# Patient Record
Sex: Female | Born: 1935
Health system: Southern US, Community
[De-identification: ages and names within clinical notes are randomized; demographics above are authoritative.]

## PROBLEM LIST (undated history)

## (undated) DIAGNOSIS — C439 Malignant melanoma of skin, unspecified: Secondary | ICD-10-CM

## (undated) DIAGNOSIS — K219 Gastro-esophageal reflux disease without esophagitis: Secondary | ICD-10-CM

## (undated) DIAGNOSIS — I7300 Raynaud's syndrome without gangrene: Secondary | ICD-10-CM

## (undated) DIAGNOSIS — K635 Polyp of colon: Secondary | ICD-10-CM

## (undated) DIAGNOSIS — L439 Lichen planus, unspecified: Secondary | ICD-10-CM

## (undated) DIAGNOSIS — J45909 Unspecified asthma, uncomplicated: Secondary | ICD-10-CM

## (undated) DIAGNOSIS — K279 Peptic ulcer, site unspecified, unspecified as acute or chronic, without hemorrhage or perforation: Secondary | ICD-10-CM

## (undated) DIAGNOSIS — I119 Hypertensive heart disease without heart failure: Secondary | ICD-10-CM

## (undated) DIAGNOSIS — Z8619 Personal history of other infectious and parasitic diseases: Secondary | ICD-10-CM

## (undated) DIAGNOSIS — M4800 Spinal stenosis, site unspecified: Secondary | ICD-10-CM

## (undated) DIAGNOSIS — I779 Disorder of arteries and arterioles, unspecified: Secondary | ICD-10-CM

## (undated) DIAGNOSIS — F32A Depression, unspecified: Secondary | ICD-10-CM

## (undated) DIAGNOSIS — Z8719 Personal history of other diseases of the digestive system: Secondary | ICD-10-CM

## (undated) DIAGNOSIS — C801 Malignant (primary) neoplasm, unspecified: Secondary | ICD-10-CM

## (undated) DIAGNOSIS — Z8669 Personal history of other diseases of the nervous system and sense organs: Secondary | ICD-10-CM

## (undated) DIAGNOSIS — R519 Headache, unspecified: Secondary | ICD-10-CM

## (undated) DIAGNOSIS — M1990 Unspecified osteoarthritis, unspecified site: Secondary | ICD-10-CM

## (undated) DIAGNOSIS — F329 Major depressive disorder, single episode, unspecified: Secondary | ICD-10-CM

## (undated) DIAGNOSIS — R002 Palpitations: Secondary | ICD-10-CM

## (undated) DIAGNOSIS — M353 Polymyalgia rheumatica: Secondary | ICD-10-CM

## (undated) DIAGNOSIS — I471 Supraventricular tachycardia, unspecified: Secondary | ICD-10-CM

## (undated) DIAGNOSIS — I739 Peripheral vascular disease, unspecified: Secondary | ICD-10-CM

## (undated) DIAGNOSIS — R51 Headache: Secondary | ICD-10-CM

## (undated) DIAGNOSIS — I2510 Atherosclerotic heart disease of native coronary artery without angina pectoris: Secondary | ICD-10-CM

## (undated) DIAGNOSIS — T7840XA Allergy, unspecified, initial encounter: Secondary | ICD-10-CM

## (undated) DIAGNOSIS — I10 Essential (primary) hypertension: Secondary | ICD-10-CM

## (undated) DIAGNOSIS — Z87440 Personal history of urinary (tract) infections: Secondary | ICD-10-CM

## (undated) DIAGNOSIS — E785 Hyperlipidemia, unspecified: Secondary | ICD-10-CM

## (undated) DIAGNOSIS — N189 Chronic kidney disease, unspecified: Secondary | ICD-10-CM

## (undated) DIAGNOSIS — E559 Vitamin D deficiency, unspecified: Secondary | ICD-10-CM

## (undated) DIAGNOSIS — R011 Cardiac murmur, unspecified: Secondary | ICD-10-CM

## (undated) DIAGNOSIS — R32 Unspecified urinary incontinence: Secondary | ICD-10-CM

## (undated) HISTORY — DX: Polyp of colon: K63.5

## (undated) HISTORY — DX: Chronic kidney disease, unspecified: N18.9

## (undated) HISTORY — DX: Raynaud's syndrome without gangrene: I73.00

## (undated) HISTORY — DX: Depression, unspecified: F32.A

## (undated) HISTORY — DX: Hyperlipidemia, unspecified: E78.5

## (undated) HISTORY — PX: KNEE ARTHROSCOPY W/ OATS PROCEDURE: SHX1880

## (undated) HISTORY — DX: Lichen planus, unspecified: L43.9

## (undated) HISTORY — DX: Unspecified urinary incontinence: R32

## (undated) HISTORY — PX: BACK SURGERY: SHX140

## (undated) HISTORY — PX: BILATERAL CARPAL TUNNEL RELEASE: SHX6508

## (undated) HISTORY — DX: Personal history of other diseases of the nervous system and sense organs: Z86.69

## (undated) HISTORY — DX: Unspecified osteoarthritis, unspecified site: M19.90

## (undated) HISTORY — DX: Unspecified asthma, uncomplicated: J45.909

## (undated) HISTORY — DX: Malignant (primary) neoplasm, unspecified: C80.1

## (undated) HISTORY — PX: CORONARY ARTERY BYPASS GRAFT: SHX141

## (undated) HISTORY — DX: Atherosclerotic heart disease of native coronary artery without angina pectoris: I25.10

## (undated) HISTORY — DX: Cardiac murmur, unspecified: R01.1

## (undated) HISTORY — PX: ADENOIDECTOMY: SUR15

## (undated) HISTORY — DX: Personal history of urinary (tract) infections: Z87.440

## (undated) HISTORY — DX: Polymyalgia rheumatica: M35.3

## (undated) HISTORY — DX: Peptic ulcer, site unspecified, unspecified as acute or chronic, without hemorrhage or perforation: K27.9

## (undated) HISTORY — PX: CHOLECYSTECTOMY: SHX55

## (undated) HISTORY — DX: Vitamin D deficiency, unspecified: E55.9

## (undated) HISTORY — DX: Personal history of other infectious and parasitic diseases: Z86.19

## (undated) HISTORY — DX: Allergy, unspecified, initial encounter: T78.40XA

## (undated) HISTORY — PX: BREAST BIOPSY: SHX20

## (undated) HISTORY — PX: BREAST SURGERY: SHX581

## (undated) HISTORY — DX: Spinal stenosis, site unspecified: M48.00

## (undated) HISTORY — PX: JOINT REPLACEMENT: SHX530

## (undated) HISTORY — PX: TOTAL HIP ARTHROPLASTY: SHX124

## (undated) HISTORY — PX: HEMORRHOID BANDING: SHX5850

## (undated) HISTORY — DX: Gastro-esophageal reflux disease without esophagitis: K21.9

## (undated) HISTORY — DX: Major depressive disorder, single episode, unspecified: F32.9

## (undated) HISTORY — DX: Essential (primary) hypertension: I10

---

## 1996-10-08 HISTORY — PX: CORONARY ARTERY BYPASS GRAFT: SHX141

## 2010-08-10 LAB — HM COLONOSCOPY

## 2011-11-29 DIAGNOSIS — Z0000 Encounter for general adult medical examination without abnormal findings: Secondary | ICD-10-CM | POA: Diagnosis not present

## 2011-11-29 DIAGNOSIS — I10 Essential (primary) hypertension: Secondary | ICD-10-CM | POA: Diagnosis not present

## 2011-11-29 DIAGNOSIS — E041 Nontoxic single thyroid nodule: Secondary | ICD-10-CM | POA: Diagnosis not present

## 2011-11-29 DIAGNOSIS — D709 Neutropenia, unspecified: Secondary | ICD-10-CM | POA: Diagnosis not present

## 2011-11-29 DIAGNOSIS — E669 Obesity, unspecified: Secondary | ICD-10-CM | POA: Diagnosis not present

## 2011-11-29 DIAGNOSIS — E7800 Pure hypercholesterolemia, unspecified: Secondary | ICD-10-CM | POA: Diagnosis not present

## 2011-11-29 DIAGNOSIS — E559 Vitamin D deficiency, unspecified: Secondary | ICD-10-CM | POA: Diagnosis not present

## 2011-12-03 DIAGNOSIS — E7800 Pure hypercholesterolemia, unspecified: Secondary | ICD-10-CM | POA: Diagnosis not present

## 2011-12-03 DIAGNOSIS — I10 Essential (primary) hypertension: Secondary | ICD-10-CM | POA: Diagnosis not present

## 2011-12-03 DIAGNOSIS — K219 Gastro-esophageal reflux disease without esophagitis: Secondary | ICD-10-CM | POA: Diagnosis not present

## 2011-12-03 DIAGNOSIS — E559 Vitamin D deficiency, unspecified: Secondary | ICD-10-CM | POA: Diagnosis not present

## 2011-12-12 DIAGNOSIS — Z1231 Encounter for screening mammogram for malignant neoplasm of breast: Secondary | ICD-10-CM | POA: Diagnosis not present

## 2012-01-23 DIAGNOSIS — J209 Acute bronchitis, unspecified: Secondary | ICD-10-CM | POA: Diagnosis not present

## 2012-01-23 DIAGNOSIS — J9801 Acute bronchospasm: Secondary | ICD-10-CM | POA: Diagnosis not present

## 2012-01-23 DIAGNOSIS — R0602 Shortness of breath: Secondary | ICD-10-CM | POA: Diagnosis not present

## 2012-07-03 DIAGNOSIS — N905 Atrophy of vulva: Secondary | ICD-10-CM | POA: Diagnosis not present

## 2012-07-03 DIAGNOSIS — N3946 Mixed incontinence: Secondary | ICD-10-CM | POA: Diagnosis not present

## 2012-07-03 DIAGNOSIS — K649 Unspecified hemorrhoids: Secondary | ICD-10-CM | POA: Diagnosis not present

## 2012-07-03 DIAGNOSIS — N895 Stricture and atresia of vagina: Secondary | ICD-10-CM | POA: Diagnosis not present

## 2012-07-03 DIAGNOSIS — Z9189 Other specified personal risk factors, not elsewhere classified: Secondary | ICD-10-CM | POA: Diagnosis not present

## 2012-07-15 DIAGNOSIS — Z23 Encounter for immunization: Secondary | ICD-10-CM | POA: Diagnosis not present

## 2012-12-08 DIAGNOSIS — E7800 Pure hypercholesterolemia, unspecified: Secondary | ICD-10-CM | POA: Diagnosis not present

## 2012-12-08 DIAGNOSIS — I10 Essential (primary) hypertension: Secondary | ICD-10-CM | POA: Diagnosis not present

## 2012-12-08 DIAGNOSIS — Z79899 Other long term (current) drug therapy: Secondary | ICD-10-CM | POA: Diagnosis not present

## 2012-12-08 DIAGNOSIS — M545 Low back pain: Secondary | ICD-10-CM | POA: Diagnosis not present

## 2012-12-08 DIAGNOSIS — I2510 Atherosclerotic heart disease of native coronary artery without angina pectoris: Secondary | ICD-10-CM | POA: Diagnosis not present

## 2012-12-08 DIAGNOSIS — E559 Vitamin D deficiency, unspecified: Secondary | ICD-10-CM | POA: Diagnosis not present

## 2013-01-14 DIAGNOSIS — Z1231 Encounter for screening mammogram for malignant neoplasm of breast: Secondary | ICD-10-CM | POA: Diagnosis not present

## 2013-05-13 ENCOUNTER — Telehealth: Payer: Self-pay | Admitting: *Deleted

## 2013-05-13 NOTE — Telephone Encounter (Signed)
This pt has not been seen in this office yet. She has a NP appt on 9/10. Okay to fill?

## 2013-05-13 NOTE — Telephone Encounter (Signed)
Refill request  Zetia 10 mg tablet   #30   Take one tablet by mouth once daily

## 2013-05-13 NOTE — Telephone Encounter (Signed)
Needs to get refilled by her previous physician until she establishes care here.

## 2013-05-13 NOTE — Telephone Encounter (Signed)
Informed pharmacist.

## 2013-06-12 ENCOUNTER — Other Ambulatory Visit: Payer: Self-pay | Admitting: Internal Medicine

## 2013-06-17 ENCOUNTER — Ambulatory Visit: Payer: Self-pay | Admitting: Internal Medicine

## 2013-06-17 ENCOUNTER — Ambulatory Visit (INDEPENDENT_AMBULATORY_CARE_PROVIDER_SITE_OTHER): Payer: Medicare Other | Admitting: Internal Medicine

## 2013-06-17 ENCOUNTER — Encounter: Payer: Self-pay | Admitting: Internal Medicine

## 2013-06-17 VITALS — BP 90/60 | HR 63 | Temp 98.4°F | Ht 64.5 in | Wt 198.5 lb

## 2013-06-17 DIAGNOSIS — M4800 Spinal stenosis, site unspecified: Secondary | ICD-10-CM

## 2013-06-17 DIAGNOSIS — E049 Nontoxic goiter, unspecified: Secondary | ICD-10-CM

## 2013-06-17 DIAGNOSIS — E7800 Pure hypercholesterolemia, unspecified: Secondary | ICD-10-CM

## 2013-06-17 DIAGNOSIS — K219 Gastro-esophageal reflux disease without esophagitis: Secondary | ICD-10-CM

## 2013-06-17 DIAGNOSIS — Z86010 Personal history of colonic polyps: Secondary | ICD-10-CM | POA: Diagnosis not present

## 2013-06-17 DIAGNOSIS — I2510 Atherosclerotic heart disease of native coronary artery without angina pectoris: Secondary | ICD-10-CM

## 2013-06-17 DIAGNOSIS — E559 Vitamin D deficiency, unspecified: Secondary | ICD-10-CM | POA: Diagnosis not present

## 2013-06-17 DIAGNOSIS — L439 Lichen planus, unspecified: Secondary | ICD-10-CM

## 2013-06-17 DIAGNOSIS — K649 Unspecified hemorrhoids: Secondary | ICD-10-CM

## 2013-06-17 DIAGNOSIS — F329 Major depressive disorder, single episode, unspecified: Secondary | ICD-10-CM

## 2013-06-17 DIAGNOSIS — I10 Essential (primary) hypertension: Secondary | ICD-10-CM

## 2013-06-17 DIAGNOSIS — K449 Diaphragmatic hernia without obstruction or gangrene: Secondary | ICD-10-CM

## 2013-06-17 DIAGNOSIS — M1990 Unspecified osteoarthritis, unspecified site: Secondary | ICD-10-CM

## 2013-06-17 DIAGNOSIS — J45909 Unspecified asthma, uncomplicated: Secondary | ICD-10-CM

## 2013-06-17 DIAGNOSIS — N390 Urinary tract infection, site not specified: Secondary | ICD-10-CM

## 2013-06-17 DIAGNOSIS — G43909 Migraine, unspecified, not intractable, without status migrainosus: Secondary | ICD-10-CM

## 2013-06-21 ENCOUNTER — Encounter: Payer: Self-pay | Admitting: Internal Medicine

## 2013-06-21 DIAGNOSIS — J45909 Unspecified asthma, uncomplicated: Secondary | ICD-10-CM | POA: Insufficient documentation

## 2013-06-21 DIAGNOSIS — N390 Urinary tract infection, site not specified: Secondary | ICD-10-CM | POA: Insufficient documentation

## 2013-06-21 DIAGNOSIS — E049 Nontoxic goiter, unspecified: Secondary | ICD-10-CM | POA: Insufficient documentation

## 2013-06-21 DIAGNOSIS — E559 Vitamin D deficiency, unspecified: Secondary | ICD-10-CM | POA: Insufficient documentation

## 2013-06-21 DIAGNOSIS — L439 Lichen planus, unspecified: Secondary | ICD-10-CM | POA: Insufficient documentation

## 2013-06-21 DIAGNOSIS — F329 Major depressive disorder, single episode, unspecified: Secondary | ICD-10-CM | POA: Insufficient documentation

## 2013-06-21 DIAGNOSIS — I2510 Atherosclerotic heart disease of native coronary artery without angina pectoris: Secondary | ICD-10-CM | POA: Insufficient documentation

## 2013-06-21 DIAGNOSIS — K648 Other hemorrhoids: Secondary | ICD-10-CM | POA: Insufficient documentation

## 2013-06-21 DIAGNOSIS — M1990 Unspecified osteoarthritis, unspecified site: Secondary | ICD-10-CM | POA: Insufficient documentation

## 2013-06-21 DIAGNOSIS — I10 Essential (primary) hypertension: Secondary | ICD-10-CM | POA: Insufficient documentation

## 2013-06-21 DIAGNOSIS — Z860100 Personal history of colon polyps, unspecified: Secondary | ICD-10-CM | POA: Insufficient documentation

## 2013-06-21 DIAGNOSIS — M4800 Spinal stenosis, site unspecified: Secondary | ICD-10-CM | POA: Insufficient documentation

## 2013-06-21 DIAGNOSIS — I25118 Atherosclerotic heart disease of native coronary artery with other forms of angina pectoris: Secondary | ICD-10-CM | POA: Insufficient documentation

## 2013-06-21 DIAGNOSIS — E7800 Pure hypercholesterolemia, unspecified: Secondary | ICD-10-CM | POA: Insufficient documentation

## 2013-06-21 DIAGNOSIS — G43909 Migraine, unspecified, not intractable, without status migrainosus: Secondary | ICD-10-CM | POA: Insufficient documentation

## 2013-06-21 DIAGNOSIS — K449 Diaphragmatic hernia without obstruction or gangrene: Secondary | ICD-10-CM | POA: Insufficient documentation

## 2013-06-21 DIAGNOSIS — K219 Gastro-esophageal reflux disease without esophagitis: Secondary | ICD-10-CM | POA: Insufficient documentation

## 2013-06-21 HISTORY — DX: Essential (primary) hypertension: I10

## 2013-06-21 NOTE — Assessment & Plan Note (Signed)
Breathing stable currently.  Uses inhalers prn.  Follow.

## 2013-06-21 NOTE — Assessment & Plan Note (Signed)
Not a significant issue for her now.  Follow.   

## 2013-06-21 NOTE — Assessment & Plan Note (Signed)
Stable on current regimen.  Follow.   

## 2013-06-21 NOTE — Assessment & Plan Note (Signed)
Stable currently.  Uses miralax to keep her bowels regular.  Follow.

## 2013-06-21 NOTE — Assessment & Plan Note (Signed)
S/p CABG 1998.  Currently doing well.  ECHO 11/28/10 revealed EF 55-60%, mild to moderate TR with RVSP 36 c/w mild pulmonary hypertension and no significant MR.    

## 2013-06-21 NOTE — Progress Notes (Signed)
Subjective:    Patient ID: Charlotte Wagner, female    DOB: Feb 09, 1936, 77 y.o.   MRN: [id number]  HPI 77 year old female with past history of CAD s/p CABG 1998, hypertension, hypercholesterolemia, depression, lichen planus and GERD.  She comes in today to follow up on these issues as well as to establish care.  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  Moved here 6/14 from [place] - to be closer to her family.  She reports a history of asthma.  Her parents smoked.  She previously smoked.  Had childhood bronchitis.  Currently breathing is doing well.  She exercises on her bike.  No sob.  She also has spinal stenosis.  Has had fusion L3-5 in 2005.  Will notice some back spasm at times.  More when she gets oob or if she twist wrong.  She also has some left leg neuropathy.  Lateral foot and toes.  Some weakness.  This is not new.  She is following and monitoring this.  Also reports a history of CAD.  S/p CABG.  Was followed by Dr Leonarda Salon - cardiology in [place].  ECHO 2-3 years ago.  States everything checked out fine.  Currently feels she is doing well from a cardiac standpoint.  Blood pressure averages 100-110/60-70.  She feels she is doing well regarding her depression.  She has decreased her elavil to 50mg  q hs and trazodone to 2 q hs.  Doing well on this regimen and has been on this regimen for years.  Has some problems with hemorrhoids.  Takes miralax to keep her bowels regular.  Will intermittently notice some BRB.  Recently did IFOB - negative.  Also has lichen planus.  Uses Boric acid and 2.5% HC cream.  Overall she feels things are stable and that she is doing relatively well.     Past Medical History  Diagnosis Date  . Asthma   . Arthritis     s/p bilateral knees and left hip replacement  . Depression   . History of chicken pox   . Cancer     melanoma right arm  . GERD (gastroesophageal reflux disease)     h/o hiatal hernia  . Allergy   . Arrhythmia     rare PVC's  . Heart murmur   . CAD  (coronary artery disease)     s/p CABG 12/1996  . Hypertension   . Hyperlipidemia   . Chronic kidney disease   . Hx of migraines     rare now  . Colon polyps     H/O  . Spinal stenosis   . Hx: UTI (urinary tract infection)   . Urine incontinence     H/O  . PUD (peptic ulcer disease)     remote history  . Lichen planus   . Vitamin D deficiency   . Raynaud's phenomenon   . PMR (polymyalgia rheumatica)     h/o    Outpatient Encounter Prescriptions as of 06/17/2013  Medication Sig Dispense Refill  . Acetaminophen (TYLENOL EXTRA STRENGTH PO) Take by mouth.      [name] amitriptyline (ELAVIL) 50 MG tablet Take 50 mg by mouth at bedtime.      [name] b complex vitamins tablet Take 1 tablet by mouth daily.      . bisoprolol-hydrochlorothiazide (ZIAC) 2.5-6.25 MG per tablet Take 1 tablet by mouth daily.      . Cholecalciferol (VITAMIN D3) 2000 UNITS TABS Take by mouth.      . Coenzyme  Q10 (CO Q 10 PO) Take by mouth.      . ezetimibe (ZETIA) 10 MG tablet Take 10 mg by mouth daily.      . folic acid (FOLVITE) 1 MG tablet Take 1 mg by mouth daily.      . hydrocortisone 2.5 % cream Apply topically 3 (three) times a week.      [name] LYSINE PO Take by mouth.      . nabumetone (RELAFEN) 750 MG tablet Take 750 mg by mouth 2 (two) times daily.      . Omega-3 Fatty Acids (FISH OIL PO) Take 2,000 mg by mouth daily.      . pantoprazole (PROTONIX) 40 MG tablet take 1 tablet by mouth twice a day  60 tablet  0  . Polyethylene Glycol 3350 (MIRALAX PO) Take by mouth as needed.      . Prenatal Vit-Fe Fumarate-FA (M-VIT PO) Take by mouth daily.      . ramipril (ALTACE) 10 MG capsule Take 10 mg by mouth daily.      . simvastatin (ZOCOR) 10 MG tablet Take 10 mg by mouth. Only on Mon, Wed, & Fri      . spironolactone (ALDACTONE) 25 MG tablet Take 25 mg by mouth daily.      . TRAZODONE HCL PO Take 250 mg by mouth.      . verapamil (COVERA HS) 180 MG (CO) 24 hr tablet Take 180 mg by mouth 2 (two) times daily.       .  vitamin B-12 (CYANOCOBALAMIN) 1000 MCG tablet Take 1,000 mcg by mouth daily.      . vitamin C (ASCORBIC ACID) 500 MG tablet Take 500 mg by mouth daily.       No facility-administered encounter medications on file as of 06/17/2013.    Review of Systems Patient denies any headache, lightheadedness or dizziness.  No sinus or allergy symptoms.   No chest pain, tightness or palpitations.  No increased shortness of breath, cough or congestion.  Breathing stable.  No nausea or vomiting.  No abdominal pain or cramping.  No acid reflux problems reported.  No significant bowel change, such as diarrhea, constipation, or melana.  Occasionally will notice some BRB when hemorrhoids flare.  Uses miralax to keep her bowels regular.  No urine change.   Some limitations with her spinal stenosis.  Has noticed some left foot and toes - weakness - neuropathy.  Doing well - regarding her depression.       Objective:   Physical Exam Filed Vitals:   06/17/13 0844  BP: 90/60  Pulse: 63  Temp: 98.4 F (47.31 C)   77 year old female in no acute distress.   HEENT:  Nares- clear.  Oropharynx - without lesions. NECK:  Supple.  Nontender.  No audible bruit.  HEART:  Appears to be regular. LUNGS:  No crackles or wheezing audible.  Respirations even and unlabored.  RADIAL PULSE:  Equal bilaterally.  ABDOMEN:  Soft, nontender.  Bowel sounds present and normal.  No audible abdominal bruit.   EXTREMITIES:  No increased edema present.  DP pulses palpable and equal bilaterally.          Assessment & Plan:  HEALTH MAINTENANCE.  Schedule a physical when due.  Colonoscopy 07/2010 with polyps.  Recommended f/u colonoscopy in five years.  Mammogram 4/14 - ok.   I spent 45 minutes with the patient and more than 50% of the time was spent in consultation regarding the above.

## 2013-06-21 NOTE — Assessment & Plan Note (Signed)
S/p lumbar fusion.  Still with some back issues.  Follow.

## 2013-06-21 NOTE — Assessment & Plan Note (Signed)
Documented thyroid goiter.  No abnormality noted today on exam.  Check thyroid function.   

## 2013-06-21 NOTE — Assessment & Plan Note (Signed)
Symptoms controlled.  Follow.  Has a remote history of PUD.    

## 2013-06-21 NOTE — Assessment & Plan Note (Signed)
Stable

## 2013-06-21 NOTE — Assessment & Plan Note (Signed)
Low cholesterol diet and exercise.  Same medication regimen.  Check lipid panel.

## 2013-06-21 NOTE — Assessment & Plan Note (Signed)
Last vitamin d level 3/14 - normal.   

## 2013-06-21 NOTE — Assessment & Plan Note (Signed)
Has been well controlled.  She monitors.  Check metabolic panel.

## 2013-06-21 NOTE — Assessment & Plan Note (Signed)
Is s/p bilateral knee replacements and left hip replacement.  Exercises.  Follow.   

## 2013-06-21 NOTE — Assessment & Plan Note (Signed)
On macrobid now.  Follow.

## 2013-06-21 NOTE — Assessment & Plan Note (Signed)
Appears to be relatively stable.  Follow.

## 2013-06-21 NOTE — Assessment & Plan Note (Signed)
Colonoscopy 08/10/10 - tubular adenoma.  Negative for high grade dysplasia.  States recommended f/u in five years.    

## 2013-07-03 ENCOUNTER — Telehealth: Payer: Self-pay | Admitting: Internal Medicine

## 2013-07-03 ENCOUNTER — Other Ambulatory Visit (INDEPENDENT_AMBULATORY_CARE_PROVIDER_SITE_OTHER): Payer: Medicare Other

## 2013-07-03 DIAGNOSIS — I10 Essential (primary) hypertension: Secondary | ICD-10-CM

## 2013-07-03 DIAGNOSIS — E049 Nontoxic goiter, unspecified: Secondary | ICD-10-CM

## 2013-07-03 DIAGNOSIS — I2510 Atherosclerotic heart disease of native coronary artery without angina pectoris: Secondary | ICD-10-CM

## 2013-07-03 DIAGNOSIS — E7800 Pure hypercholesterolemia, unspecified: Secondary | ICD-10-CM | POA: Diagnosis not present

## 2013-07-03 LAB — CBC WITH DIFFERENTIAL/PLATELET
Basophils Absolute: 0 10*3/uL (ref 0.0–0.1)
Basophils Relative: 0.5 % (ref 0.0–3.0)
Eosinophils Relative: 4.5 % (ref 0.0–5.0)
Lymphocytes Relative: 22.5 % (ref 12.0–46.0)
Monocytes Absolute: 0.5 10*3/uL (ref 0.1–1.0)
Monocytes Relative: 13 % — ABNORMAL HIGH (ref 3.0–12.0)
Neutrophils Relative %: 59.5 % (ref 43.0–77.0)
Platelets: 210 10*3/uL (ref 150.0–400.0)
WBC: 3.7 10*3/uL — ABNORMAL LOW (ref 4.5–10.5)

## 2013-07-03 LAB — BASIC METABOLIC PANEL
CO2: 26 mEq/L (ref 19–32)
Creatinine, Ser: 1 mg/dL (ref 0.4–1.2)
GFR: 58.08 mL/min — ABNORMAL LOW (ref >60.00)
Glucose, Bld: 93 mg/dL (ref 70–99)
Potassium: 4.6 mEq/L (ref 3.5–5.1)
Sodium: 137 mEq/L (ref 135–145)

## 2013-07-03 LAB — TSH: TSH: 1.28 u[IU]/mL (ref 0.35–5.50)

## 2013-07-03 LAB — HEPATIC FUNCTION PANEL
Albumin: 4 g/dL (ref 3.5–5.2)
Bilirubin, Direct: 0.1 mg/dL (ref 0.0–0.3)
Total Protein: 6.2 g/dL (ref 6.0–8.3)

## 2013-07-03 LAB — LIPID PANEL
Cholesterol: 208 mg/dL — ABNORMAL HIGH (ref 0–200)
HDL: 46.4 mg/dL (ref >39.00)
Triglycerides: 124 mg/dL (ref 0.0–149.0)
VLDL: 24.8 mg/dL (ref 0.0–40.0)

## 2013-07-03 LAB — LDL CHOLESTEROL, DIRECT: Direct LDL: 126.7 mg/dL

## 2013-07-03 NOTE — Telephone Encounter (Signed)
In your green folder 

## 2013-07-03 NOTE — Telephone Encounter (Signed)
Pt dropped of handicap form And form from [facility] stating it was ok for her to exercise in box

## 2013-07-06 ENCOUNTER — Other Ambulatory Visit: Payer: Self-pay | Admitting: Internal Medicine

## 2013-07-06 ENCOUNTER — Other Ambulatory Visit: Payer: Self-pay | Admitting: *Deleted

## 2013-07-06 DIAGNOSIS — D7589 Other specified diseases of blood and blood-forming organs: Secondary | ICD-10-CM

## 2013-07-06 DIAGNOSIS — D72819 Decreased white blood cell count, unspecified: Secondary | ICD-10-CM

## 2013-07-06 MED ORDER — HYDROCORTISONE 2.5 % EX CREA: TOPICAL_CREAM | CUTANEOUS | Status: DC

## 2013-07-06 NOTE — Telephone Encounter (Signed)
Forms completed and will place in your box.

## 2013-07-06 NOTE — Progress Notes (Signed)
Order placed for f/u labs.  

## 2013-07-06 NOTE — Telephone Encounter (Signed)
Placed up front for mailing & Copy of Medical Clearance will be faxed

## 2013-07-13 ENCOUNTER — Other Ambulatory Visit: Payer: Self-pay | Admitting: *Deleted

## 2013-07-13 MED ORDER — NABUMETONE 750 MG PO TABS: 750.0000 mg | ORAL_TABLET | Freq: Two times a day (BID) | ORAL | Status: DC

## 2013-07-13 MED ORDER — EZETIMIBE 10 MG PO TABS: 10.0000 mg | ORAL_TABLET | Freq: Every day | ORAL | Status: DC

## 2013-07-16 ENCOUNTER — Telehealth: Payer: Self-pay | Admitting: Internal Medicine

## 2013-07-16 NOTE — Telephone Encounter (Signed)
Pt notified of her lab results. Not sure why I never saw them before today. Pt does not want to repeat labs at this time because she states that her current readings are actually good for her (her usual). She has had a work-up in the past and everything was fine. Mailed patient a copy of labs after notifying her of results.

## 2013-07-16 NOTE — Telephone Encounter (Signed)
Pt called and wanted her labs results mailed to her.  Pt stated she has never received her results.

## 2013-07-22 ENCOUNTER — Other Ambulatory Visit: Payer: Self-pay | Admitting: *Deleted

## 2013-07-22 MED ORDER — RAMIPRIL 10 MG PO CAPS: 10.0000 mg | ORAL_CAPSULE | Freq: Every day | ORAL | Status: DC

## 2013-07-27 ENCOUNTER — Other Ambulatory Visit: Payer: Self-pay | Admitting: *Deleted

## 2013-07-27 MED ORDER — HYDROCORTISONE 2.5 % EX CREA: TOPICAL_CREAM | CUTANEOUS | Status: DC

## 2013-08-03 ENCOUNTER — Ambulatory Visit: Payer: Self-pay | Admitting: Internal Medicine

## 2013-08-03 DIAGNOSIS — Z23 Encounter for immunization: Secondary | ICD-10-CM | POA: Diagnosis not present

## 2013-08-05 ENCOUNTER — Telehealth: Payer: Self-pay | Admitting: Internal Medicine

## 2013-08-05 ENCOUNTER — Other Ambulatory Visit: Payer: Self-pay | Admitting: *Deleted

## 2013-08-05 MED ORDER — SPIRONOLACTONE 25 MG PO TABS: 25.0000 mg | ORAL_TABLET | ORAL | Status: DC

## 2013-08-05 MED ORDER — AMITRIPTYLINE HCL 50 MG PO TABS: 50.0000 mg | ORAL_TABLET | Freq: Every day | ORAL | Status: DC

## 2013-08-05 NOTE — Telephone Encounter (Signed)
Pt states pharmacy faxed on Friday and Monday request for spironolactone 25 1 q a.m. And has not gotten a response.  Pt states she also needs amitriptyline 50 1 qhs that was previously prescribed from [place].  Pt states she gave a list of meds at her first visit in September.  Pt says she is out of amitriptyline and needs this to sleep.  Please contact pt.

## 2013-08-05 NOTE — Telephone Encounter (Signed)
Refilled meds today & pt notified

## 2013-08-12 ENCOUNTER — Other Ambulatory Visit: Payer: Self-pay | Admitting: Adult Health

## 2013-08-12 NOTE — Telephone Encounter (Signed)
Eprescribed.

## 2013-08-17 ENCOUNTER — Other Ambulatory Visit: Payer: Self-pay | Admitting: *Deleted

## 2013-08-17 MED ORDER — HYDROCORTISONE 2.5 % EX CREA: TOPICAL_CREAM | CUTANEOUS | Status: DC

## 2013-09-07 ENCOUNTER — Other Ambulatory Visit: Payer: Self-pay | Admitting: *Deleted

## 2013-09-07 MED ORDER — HYDROCORTISONE 2.5 % EX CREA: TOPICAL_CREAM | CUTANEOUS | Status: DC

## 2013-09-22 ENCOUNTER — Other Ambulatory Visit: Payer: Self-pay | Admitting: *Deleted

## 2013-09-22 NOTE — Telephone Encounter (Signed)
Ok refill? 

## 2013-09-22 NOTE — Telephone Encounter (Signed)
Need to clarify dose of trazodone with pt.  Need mg dose and how much she is taking per night.  Thanks.

## 2013-09-23 ENCOUNTER — Encounter: Payer: Self-pay | Admitting: *Deleted

## 2013-09-23 MED ORDER — TRAZODONE HCL 150 MG PO TABS: ORAL_TABLET | ORAL | Status: DC

## 2013-09-23 NOTE — Telephone Encounter (Signed)
LMTCB

## 2013-09-23 NOTE — Telephone Encounter (Signed)
Pt takes 250mg  every night (1 2/3 of the 150mg  tablets)-usually gets #60

## 2013-09-23 NOTE — Telephone Encounter (Signed)
Refilled trazodone 150mg  (as directed) #60 with 2 refills.

## 2013-09-28 ENCOUNTER — Other Ambulatory Visit: Payer: Self-pay | Admitting: *Deleted

## 2013-09-28 NOTE — Telephone Encounter (Signed)
Refill

## 2013-09-29 MED ORDER — HYDROCORTISONE 2.5 % EX CREA: TOPICAL_CREAM | CUTANEOUS | Status: DC

## 2013-09-29 NOTE — Telephone Encounter (Signed)
Refilled hydrocortisone cream

## 2013-10-09 ENCOUNTER — Other Ambulatory Visit: Payer: Self-pay | Admitting: *Deleted

## 2013-10-09 MED ORDER — NABUMETONE 750 MG PO TABS: 750.0000 mg | ORAL_TABLET | Freq: Two times a day (BID) | ORAL | Status: DC

## 2013-10-13 ENCOUNTER — Telehealth: Payer: Self-pay | Admitting: Internal Medicine

## 2013-10-13 NOTE — Telephone Encounter (Signed)
Pt left vm.  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  Contacted pt to make appt., scheduled 1/7 @ 10:45 a.m.

## 2013-10-13 NOTE — Telephone Encounter (Signed)
Noted  

## 2013-10-14 ENCOUNTER — Encounter: Payer: Self-pay | Admitting: Internal Medicine

## 2013-10-14 ENCOUNTER — Ambulatory Visit (INDEPENDENT_AMBULATORY_CARE_PROVIDER_SITE_OTHER): Payer: Medicare Other | Admitting: Internal Medicine

## 2013-10-14 VITALS — BP 130/80 | HR 60 | Temp 98.3°F | Ht 64.5 in | Wt 197.0 lb

## 2013-10-14 DIAGNOSIS — I10 Essential (primary) hypertension: Secondary | ICD-10-CM

## 2013-10-14 DIAGNOSIS — J45909 Unspecified asthma, uncomplicated: Secondary | ICD-10-CM

## 2013-10-14 DIAGNOSIS — K219 Gastro-esophageal reflux disease without esophagitis: Secondary | ICD-10-CM

## 2013-10-14 DIAGNOSIS — J209 Acute bronchitis, unspecified: Secondary | ICD-10-CM | POA: Diagnosis not present

## 2013-10-14 MED ORDER — LEVALBUTEROL TARTRATE 45 MCG/ACT IN AERO: 2.0000 | INHALATION_SPRAY | Freq: Four times a day (QID) | RESPIRATORY_TRACT | Status: DC | PRN

## 2013-10-14 MED ORDER — CLARITHROMYCIN ER 500 MG PO TB24: 1000.0000 mg | ORAL_TABLET | Freq: Every day | ORAL | Status: DC

## 2013-10-14 MED ORDER — FLUTICASONE-SALMETEROL 250-50 MCG/DOSE IN AEPB: 1.0000 | INHALATION_SPRAY | Freq: Two times a day (BID) | RESPIRATORY_TRACT | Status: DC

## 2013-10-14 NOTE — Progress Notes (Signed)
Pre-visit discussion using our clinic review tool. No additional management support is needed unless otherwise documented below in the visit note.  

## 2013-10-18 ENCOUNTER — Encounter: Payer: Self-pay | Admitting: Internal Medicine

## 2013-10-18 DIAGNOSIS — J209 Acute bronchitis, unspecified: Secondary | ICD-10-CM | POA: Insufficient documentation

## 2013-10-18 NOTE — Assessment & Plan Note (Signed)
Has been well controlled.  She monitors.    

## 2013-10-18 NOTE — Assessment & Plan Note (Signed)
Treat current infection.  xopenex inhaler refilled.  Has advair she uses.  Follow.

## 2013-10-18 NOTE — Progress Notes (Signed)
Subjective:    Patient ID: Phenix Grein, female    DOB: [birth year], 78 y.o.   MRN: [id number]  HPI 78 year old female with past history of CAD s/p CABG 1998, hypertension, hypercholesterolemia, depression, lichen planus and GERD.  She comes in today as a work in with concerns regarding increased cough and congestion. Symptoms have worsened over the last few days.  Started out at Christmas - with a cold.  This improved and then a few days ago, worsening symptoms.  Describes increased nasal congestion.  No sinus pressure.  Increased chest congestion and cough.  Sleeping sitting up.  No fever.  Eating.  States she gets bronchitis intermittently and these symptoms are c/w her previous infections.  She had some keflex at home and did take this medication.  Usually takes Biaxin and uses her inhalers.  This usually works.  She request biaxin as the abx.  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     Past Medical History  Diagnosis Date  . Asthma   . Arthritis     s/p bilateral knees and left hip replacement  . Depression   . History of chicken pox   . Cancer     melanoma right arm  . GERD (gastroesophageal reflux disease)     h/o hiatal hernia  . Allergy   . Arrhythmia     rare PVC's  . Heart murmur   . CAD (coronary artery disease)     s/p CABG 12/1996  . Hypertension   . Hyperlipidemia   . Chronic kidney disease   . Hx of migraines     rare now  . Colon polyps     H/O  . Spinal stenosis   . Hx: UTI (urinary tract infection)   . Urine incontinence     H/O  . PUD (peptic ulcer disease)     remote history  . Lichen planus   . Vitamin D deficiency   . Raynaud's phenomenon   . PMR (polymyalgia rheumatica)     h/o    Outpatient Encounter Prescriptions as of 10/14/2013  Medication Sig  . Acetaminophen (TYLENOL EXTRA STRENGTH PO) Take by mouth.  [name] amitriptyline (ELAVIL) 50 MG tablet Take 1 tablet (50 mg total) by mouth at bedtime.  [name] b complex vitamins  tablet Take 1 tablet by mouth daily.  . bisoprolol-hydrochlorothiazide (ZIAC) 2.5-6.25 MG per tablet Take 1 tablet by mouth daily.  . Cholecalciferol (VITAMIN D3) 2000 UNITS TABS Take by mouth.  . Coenzyme Q10 (CO Q 10 PO) Take by mouth.  . ezetimibe (ZETIA) 10 MG tablet Take 1 tablet (10 mg total) by mouth daily.  . folic acid (FOLVITE) 1 MG tablet Take 1 mg by mouth daily.  . hydrocortisone 2.5 % cream Apply topically 3 (three) times a week.  [name] LYSINE PO Take by mouth.  . nabumetone (RELAFEN) 750 MG tablet Take 1 tablet (750 mg total) by mouth 2 (two) times daily.  . Omega-3 Fatty Acids (FISH OIL PO) Take 2,000 mg by mouth daily.  . pantoprazole (PROTONIX) 40 MG tablet take 1 tablet by mouth twice a day  . Polyethylene Glycol 3350 (MIRALAX PO) Take by mouth as needed.  . Prenatal Vit-Fe Fumarate-FA (M-VIT PO) Take by mouth daily.  . ramipril (ALTACE) 10 MG capsule Take 1 capsule (10 mg total) by mouth daily.  . simvastatin (ZOCOR) 10 MG tablet Take 10 mg by mouth. Only on Mon, Wed, & Fri  .  spironolactone (ALDACTONE) 25 MG tablet Take 1 tablet (25 mg total) by mouth every morning.  . traZODone (DESYREL) 150 MG tablet Take as directed.  . verapamil (COVERA HS) 180 MG (CO) 24 hr tablet Take 180 mg by mouth 2 (two) times daily.   . vitamin B-12 (CYANOCOBALAMIN) 1000 MCG tablet Take 1,000 mcg by mouth daily.  . vitamin C (ASCORBIC ACID) 500 MG tablet Take 500 mg by mouth daily.  . clarithromycin (BIAXIN XL) 500 MG 24 hr tablet Take 2 tablets (1,000 mg total) by mouth daily.  . Fluticasone-Salmeterol (ADVAIR DISKUS) 250-50 MCG/DOSE AEPB Inhale 1 puff into the lungs 2 (two) times daily.  [name] levalbuterol (XOPENEX HFA) 45 MCG/ACT inhaler Inhale 2 puffs into the lungs every 6 (six) hours as needed for wheezing.    Review of Systems Patient denies any headache, lightheadedness or dizziness.  Does report some increased nasal congestion and chest congestion.  Left ear feels full.  Increased cough.   Some wheezing.  No chest pain, tightness or palpitations.  No increased shortness of breath.  No nausea or vomiting.  No abdominal pain or cramping.  No acid reflux problems reported.  No significant bowel change, such as diarrhea.  Request biaxin stating this works well for her.  Uses her advair and atrovent inhaler.  Does not have any xopenex.  Request refill.         Objective:   Physical Exam  Filed Vitals:   10/14/13 1102  BP: 130/80  Pulse: 60  Temp: 98.3 F (64.27 C)   78 year old female in no acute distress.   HEENT:  Nares- erythematous turbinates.   Oropharynx - without lesions.  No significant tenderness to palpation over the sinuses.  NECK:  Supple.  Nontender.    HEART:  Appears to be regular. LUNGS:  No crackles.  Increased congestion.  Cleared with coughing.  Respirations even and unlabored.          Assessment & Plan:  HEALTH MAINTENANCE.  Scheduled for a physical.  Colonoscopy 07/2010 with polyps.  Recommended f/u colonoscopy in five years.  Mammogram 4/14 - ok.

## 2013-10-18 NOTE — Assessment & Plan Note (Signed)
Treat with biaxin per pt request.  We discussed the possible drug interactions.  She will hold her cholesterol medication.  Has used with advair.  States this works well for her and she request prescription for biaxin.  Refilled xopenex.  advair as directed.  mucinex and robitussin as directed - if tolerated.  Saline nasal spray.  biaxin as directed.  Follow.  If any change or worsening symptoms or problems, she is to be reevaluated.  Pt comfortable with this plan.

## 2013-10-18 NOTE — Assessment & Plan Note (Signed)
Symptoms controlled.  Follow.  Has a remote history of PUD.    

## 2013-10-20 MED ORDER — IPRATROPIUM BROMIDE HFA 17 MCG/ACT IN AERS: 2.0000 | INHALATION_SPRAY | Freq: Four times a day (QID) | RESPIRATORY_TRACT | Status: DC | PRN

## 2013-10-20 NOTE — Telephone Encounter (Signed)
Pt came in today she is feeling much better.  She has been checking with [facility] on [address] for her rx of atrovent Please advise when this is called in.  [facility] stated they have not received rx.

## 2013-10-20 NOTE — Telephone Encounter (Signed)
rx sent in for atrovent inhaler.

## 2013-10-20 NOTE — Telephone Encounter (Signed)
See below

## 2013-10-29 ENCOUNTER — Telehealth: Payer: Self-pay | Admitting: Internal Medicine

## 2013-10-29 ENCOUNTER — Other Ambulatory Visit: Payer: Self-pay | Admitting: *Deleted

## 2013-10-29 MED ORDER — HYDROCORTISONE 2.5 % EX CREA: TOPICAL_CREAM | CUTANEOUS | Status: DC

## 2013-10-29 NOTE — Telephone Encounter (Signed)
Pt dropped off information about rx.  She needs 60 per month not 30 of pantopraze Information in box.  She stated pharmacy would be contacting the office also

## 2013-10-29 NOTE — Telephone Encounter (Signed)
Ok refill? 

## 2013-11-10 ENCOUNTER — Telehealth: Payer: Self-pay | Admitting: *Deleted

## 2013-11-10 NOTE — Telephone Encounter (Signed)
Received a call from [facility] notifying us that her Pantoprazole PA was approved as of 11/06/13 for one year. I have also notified the pharmacy

## 2013-11-10 NOTE — Telephone Encounter (Signed)
PA for Pantoprazole was approved for one year beginning 11/06/13-pharmacy notified

## 2013-11-20 ENCOUNTER — Other Ambulatory Visit: Payer: Self-pay | Admitting: *Deleted

## 2013-11-20 NOTE — Telephone Encounter (Signed)
Ok refill? Last filled 10/29/13

## 2013-11-21 MED ORDER — HYDROCORTISONE 2.5 % EX CREA: TOPICAL_CREAM | CUTANEOUS | Status: DC

## 2013-11-21 NOTE — Telephone Encounter (Signed)
Refilled hydrocortisone x2.

## 2013-11-25 ENCOUNTER — Other Ambulatory Visit: Payer: Self-pay | Admitting: *Deleted

## 2013-11-26 MED ORDER — HYDROCORTISONE 2.5 % EX CREA
TOPICAL_CREAM | CUTANEOUS | Status: DC
Start: ? — End: 2013-12-11

## 2013-11-30 ENCOUNTER — Telehealth: Payer: Self-pay | Admitting: *Deleted

## 2013-11-30 MED ORDER — FOLIC ACID 1 MG PO TABS: 1.0000 mg | ORAL_TABLET | Freq: Every day | ORAL | Status: DC

## 2013-11-30 NOTE — Telephone Encounter (Signed)
Refill sent.

## 2013-11-30 NOTE — Telephone Encounter (Signed)
Refill Request  Folic acid 1 mg tablet   #100   Take one tablet by mouth every morning     Refill Request  Bisoprolol Fumarate 5 mg tab  Take 1/2 tablet by mouth once daily

## 2013-12-07 ENCOUNTER — Other Ambulatory Visit: Payer: Self-pay | Admitting: *Deleted

## 2013-12-07 MED ORDER — BISOPROLOL-HYDROCHLOROTHIAZIDE 2.5-6.25 MG PO TABS: 1.0000 | ORAL_TABLET | Freq: Every day | ORAL | Status: DC

## 2013-12-10 ENCOUNTER — Encounter: Payer: Self-pay | Admitting: *Deleted

## 2013-12-11 ENCOUNTER — Ambulatory Visit (INDEPENDENT_AMBULATORY_CARE_PROVIDER_SITE_OTHER): Payer: Medicare Other | Admitting: Internal Medicine

## 2013-12-11 ENCOUNTER — Encounter: Payer: Self-pay | Admitting: *Deleted

## 2013-12-11 ENCOUNTER — Encounter: Payer: Self-pay | Admitting: Internal Medicine

## 2013-12-11 ENCOUNTER — Encounter (INDEPENDENT_AMBULATORY_CARE_PROVIDER_SITE_OTHER): Payer: Self-pay

## 2013-12-11 VITALS — BP 122/70 | HR 73 | Temp 97.7°F | Wt 193.0 lb

## 2013-12-11 DIAGNOSIS — M4800 Spinal stenosis, site unspecified: Secondary | ICD-10-CM

## 2013-12-11 DIAGNOSIS — L989 Disorder of the skin and subcutaneous tissue, unspecified: Secondary | ICD-10-CM

## 2013-12-11 DIAGNOSIS — F3289 Other specified depressive episodes: Secondary | ICD-10-CM

## 2013-12-11 DIAGNOSIS — Z860100 Personal history of colon polyps, unspecified: Secondary | ICD-10-CM

## 2013-12-11 DIAGNOSIS — E7800 Pure hypercholesterolemia, unspecified: Secondary | ICD-10-CM

## 2013-12-11 DIAGNOSIS — M1990 Unspecified osteoarthritis, unspecified site: Secondary | ICD-10-CM

## 2013-12-11 DIAGNOSIS — E049 Nontoxic goiter, unspecified: Secondary | ICD-10-CM

## 2013-12-11 DIAGNOSIS — I10 Essential (primary) hypertension: Secondary | ICD-10-CM

## 2013-12-11 DIAGNOSIS — Z1239 Encounter for other screening for malignant neoplasm of breast: Secondary | ICD-10-CM

## 2013-12-11 DIAGNOSIS — D72819 Decreased white blood cell count, unspecified: Secondary | ICD-10-CM | POA: Diagnosis not present

## 2013-12-11 DIAGNOSIS — D7589 Other specified diseases of blood and blood-forming organs: Secondary | ICD-10-CM | POA: Diagnosis not present

## 2013-12-11 DIAGNOSIS — J45909 Unspecified asthma, uncomplicated: Secondary | ICD-10-CM

## 2013-12-11 DIAGNOSIS — F329 Major depressive disorder, single episode, unspecified: Secondary | ICD-10-CM

## 2013-12-11 DIAGNOSIS — I2510 Atherosclerotic heart disease of native coronary artery without angina pectoris: Secondary | ICD-10-CM | POA: Diagnosis not present

## 2013-12-11 DIAGNOSIS — K219 Gastro-esophageal reflux disease without esophagitis: Secondary | ICD-10-CM

## 2013-12-11 DIAGNOSIS — F32A Depression, unspecified: Secondary | ICD-10-CM

## 2013-12-11 DIAGNOSIS — E559 Vitamin D deficiency, unspecified: Secondary | ICD-10-CM | POA: Diagnosis not present

## 2013-12-11 DIAGNOSIS — K449 Diaphragmatic hernia without obstruction or gangrene: Secondary | ICD-10-CM

## 2013-12-11 LAB — CBC WITH DIFFERENTIAL/PLATELET
BASOS ABS: 0 10*3/uL (ref 0.0–0.1)
Basophils Relative: 0.3 % (ref 0.0–3.0)
EOS ABS: 0.2 10*3/uL (ref 0.0–0.7)
Eosinophils Relative: 3.5 % (ref 0.0–5.0)
HCT: 38.7 % (ref 36.0–46.0)
Hemoglobin: 12.9 g/dL (ref 12.0–15.0)
LYMPHS PCT: 21.1 % (ref 12.0–46.0)
Lymphs Abs: 0.9 10*3/uL (ref 0.7–4.0)
MCHC: 33.2 g/dL (ref 30.0–36.0)
MCV: 100.7 fl — ABNORMAL HIGH (ref 78.0–100.0)
Monocytes Absolute: 0.5 10*3/uL (ref 0.1–1.0)
Monocytes Relative: 12.3 % — ABNORMAL HIGH (ref 3.0–12.0)
Neutro Abs: 2.7 10*3/uL (ref 1.4–7.7)
Neutrophils Relative %: 62.8 % (ref 43.0–77.0)
Platelets: 228 10*3/uL (ref 150.0–400.0)
RBC: 3.84 Mil/uL — ABNORMAL LOW (ref 3.87–5.11)
RDW: 13.2 % (ref 11.5–14.6)
WBC: 4.3 10*3/uL — ABNORMAL LOW (ref 4.5–10.5)

## 2013-12-11 LAB — BASIC METABOLIC PANEL
BUN: 26 mg/dL — AB (ref 6–23)
CO2: 29 mEq/L (ref 19–32)
CREATININE: 1 mg/dL (ref 0.4–1.2)
Calcium: 10.1 mg/dL (ref 8.4–10.5)
Chloride: 103 mEq/L (ref 96–112)
GFR: 56.69 mL/min — AB (ref >60.00)
GLUCOSE: 86 mg/dL (ref 70–99)
Potassium: 4.4 mEq/L (ref 3.5–5.1)
SODIUM: 138 meq/L (ref 135–145)

## 2013-12-11 LAB — HEPATIC FUNCTION PANEL
ALK PHOS: 49 U/L (ref 39–117)
ALT: 26 U/L (ref 0–35)
AST: 25 U/L (ref 0–37)
Albumin: 4.3 g/dL (ref 3.5–5.2)
Bilirubin, Direct: 0.1 mg/dL (ref 0.0–0.3)
TOTAL PROTEIN: 6.5 g/dL (ref 6.0–8.3)
Total Bilirubin: 0.8 mg/dL (ref 0.3–1.2)

## 2013-12-11 LAB — LIPID PANEL
CHOLESTEROL: 177 mg/dL (ref 0–200)
HDL: 54.7 mg/dL (ref >39.00)
LDL Cholesterol: 103 mg/dL — ABNORMAL HIGH (ref 0–99)
Total CHOL/HDL Ratio: 3
Triglycerides: 95 mg/dL (ref 0.0–149.0)
VLDL: 19 mg/dL (ref 0.0–40.0)

## 2013-12-11 LAB — VITAMIN B12: Vitamin B-12: >1500 pg/mL — ABNORMAL HIGH (ref 211–911)

## 2013-12-11 MED ORDER — HYDROCORTISONE 2.5 % EX CREA: TOPICAL_CREAM | CUTANEOUS | Status: DC

## 2013-12-11 MED ORDER — AMITRIPTYLINE HCL 25 MG PO TABS: ORAL_TABLET | ORAL | Status: DC

## 2013-12-11 MED ORDER — BISOPROLOL FUMARATE 5 MG PO TABS: ORAL_TABLET | ORAL | Status: DC

## 2013-12-11 MED ORDER — FOLIC ACID 1 MG PO TABS: 1.0000 mg | ORAL_TABLET | Freq: Every day | ORAL | Status: DC

## 2013-12-11 NOTE — Progress Notes (Signed)
Pre visit review using our clinic review tool, if applicable. No additional management support is needed unless otherwise documented below in the visit note. 

## 2013-12-11 NOTE — Progress Notes (Signed)
Subjective:    Patient ID: Charlotte Wagner, female    DOB: Aug 24, 1936, 78 y.o.   MRN: [id number]  HPI 78 year old female with past history of CAD s/p CABG 1998, hypertension, hypercholesterolemia, depression, lichen planus and GERD.  She comes in today to follow up on these issues as well as for a complete physical exam.  She states overall she is doing relatively well.  Exercising.  Riding her bike.  She plans to have physical therapy for her gait and core balance/strengthening.  Plans to see a therapist at [facility].  Eating and drinking well.  No increased cough and congestion.  Bowels stable.     Past Medical History  Diagnosis Date  . Asthma   . Arthritis     s/p bilateral knees and left hip replacement  . Depression   . History of chicken pox   . Cancer     melanoma right arm  . GERD (gastroesophageal reflux disease)     h/o hiatal hernia  . Allergy   . Arrhythmia     rare PVC's  . Heart murmur   . CAD (coronary artery disease)     s/p CABG 12/1996  . Hypertension   . Hyperlipidemia   . Chronic kidney disease   . Hx of migraines     rare now  . Colon polyps     H/O  . Spinal stenosis   . Hx: UTI (urinary tract infection)   . Urine incontinence     H/O  . PUD (peptic ulcer disease)     remote history  . Lichen planus   . Vitamin D deficiency   . Raynaud's phenomenon   . PMR (polymyalgia rheumatica)     h/o    Outpatient Encounter Prescriptions as of 12/11/2013  Medication Sig  . Acetaminophen (TYLENOL EXTRA STRENGTH PO) Take by mouth.  [name] amitriptyline (ELAVIL) 50 MG tablet Take 1 tablet (50 mg total) by mouth at bedtime.  [name] b complex vitamins tablet Take 1 tablet by mouth daily.  . bisoprolol-hydrochlorothiazide (ZIAC) 2.5-6.25 MG per tablet Take 1 tablet by mouth daily.  . Cholecalciferol (VITAMIN D3) 2000 UNITS TABS Take by mouth.  . clarithromycin (BIAXIN XL) 500 MG 24 hr tablet Take 2 tablets (1,000 mg total) by mouth daily.  . Coenzyme Q10 (CO Q 10 PO) Take by  mouth.  . ezetimibe (ZETIA) 10 MG tablet Take 1 tablet (10 mg total) by mouth daily.  . Fluticasone-Salmeterol (ADVAIR DISKUS) 250-50 MCG/DOSE AEPB Inhale 1 puff into the lungs 2 (two) times daily.  . folic acid (FOLVITE) 1 MG tablet Take 1 tablet (1 mg total) by mouth daily.  . hydrocortisone 2.5 % cream Apply topically 3 (three) times a week.  [name] ipratropium (ATROVENT HFA) 17 MCG/ACT inhaler Inhale 2 puffs into the lungs every 6 (six) hours as needed for wheezing.  . levalbuterol (XOPENEX HFA) 45 MCG/ACT inhaler Inhale 2 puffs into the lungs every 6 (six) hours as needed for wheezing.  [name] LYSINE PO Take by mouth.  . nabumetone (RELAFEN) 750 MG tablet Take 1 tablet (750 mg total) by mouth 2 (two) times daily.  . Omega-3 Fatty Acids (FISH OIL PO) Take 2,000 mg by mouth daily.  . pantoprazole (PROTONIX) 40 MG tablet take 1 tablet by mouth twice a day  . Polyethylene Glycol 3350 (MIRALAX PO) Take by mouth as needed.  . Prenatal Vit-Fe Fumarate-FA (M-VIT PO) Take by mouth daily.  . ramipril (ALTACE) 10 MG capsule Take 1  capsule (10 mg total) by mouth daily.  . simvastatin (ZOCOR) 10 MG tablet Take 10 mg by mouth. Only on Mon, Wed, & Fri  . spironolactone (ALDACTONE) 25 MG tablet Take 1 tablet (25 mg total) by mouth every morning.  . traZODone (DESYREL) 150 MG tablet Take as directed.  . verapamil (COVERA HS) 180 MG (CO) 24 hr tablet Take 180 mg by mouth 2 (two) times daily.   . vitamin B-12 (CYANOCOBALAMIN) 1000 MCG tablet Take 1,000 mcg by mouth daily.  . vitamin C (ASCORBIC ACID) 500 MG tablet Take 500 mg by mouth daily.    Review of Systems Patient denies any headache, lightheadedness or dizziness.  No sinus or allergy symptoms.   No chest pain, tightness or palpitations.  No increased shortness of breath.  No nausea or vomiting.  No abdominal pain or cramping.  No acid reflux problems reported.  No significant bowel change, such as diarrhea.  Right arm lesion.           Objective:    Physical Exam  Filed Vitals:   12/11/13 1048  BP: 122/70  Pulse: 73  Temp: 97.7 F (36.5 C)    blood pressure recheck:  74/44  78 year old female in no acute distress.   HEENT:  Nares- clear.  Oropharynx - without lesions. NECK:  Supple.  Nontender.  No audible bruit.  HEART:  Appears to be regular. LUNGS:  No crackles or wheezing audible.  Respirations even and unlabored.  RADIAL PULSE:  Equal bilaterally.    BREASTS:  No nipple discharge or nipple retraction present.  Could not appreciate any distinct nodules or axillary adenopathy.  ABDOMEN:  Soft, nontender.  Bowel sounds present and normal.  No audible abdominal bruit.  GU:  Not performed.     EXTREMITIES:  No increased edema present.  DP pulses palpable and equal bilaterally.          Assessment & Plan:  HEALTH MAINTENANCE.  Physical today.  Colonoscopy 07/2010 with polyps.  Recommended f/u colonoscopy in five years.  Mammogram 4/14 - ok.

## 2013-12-12 LAB — VITAMIN D 25 HYDROXY (VIT D DEFICIENCY, FRACTURES): Vit D, 25-Hydroxy: 44 ng/mL (ref 30–89)

## 2013-12-14 ENCOUNTER — Encounter: Payer: Self-pay | Admitting: Internal Medicine

## 2013-12-14 ENCOUNTER — Telehealth: Payer: Self-pay | Admitting: Internal Medicine

## 2013-12-14 ENCOUNTER — Encounter: Payer: Self-pay | Admitting: *Deleted

## 2013-12-14 DIAGNOSIS — L989 Disorder of the skin and subcutaneous tissue, unspecified: Secondary | ICD-10-CM | POA: Insufficient documentation

## 2013-12-14 DIAGNOSIS — Z79899 Other long term (current) drug therapy: Secondary | ICD-10-CM | POA: Diagnosis not present

## 2013-12-14 NOTE — Assessment & Plan Note (Signed)
Symptoms controlled.  Follow.  Has a remote history of PUD.    

## 2013-12-14 NOTE — Assessment & Plan Note (Signed)
Low cholesterol diet and exercise.  Same medication regimen.  Follow lipid panel.

## 2013-12-14 NOTE — Telephone Encounter (Signed)
I sent you a referral request for dermatology referral on this pt for a persistent right arm lesion.  She wants to see Dr Sarina Ser.  Forgot to put this in the referral message.

## 2013-12-14 NOTE — Assessment & Plan Note (Signed)
Appears to be stable.  Follow.   

## 2013-12-14 NOTE — Assessment & Plan Note (Signed)
Stable on current regimen.  Is planning to decrease her amitriptyline.  Follow.

## 2013-12-14 NOTE — Assessment & Plan Note (Signed)
Refer to dermatology for further evaluation. 

## 2013-12-14 NOTE — Telephone Encounter (Signed)
Ok

## 2013-12-14 NOTE — Assessment & Plan Note (Signed)
S/p lumbar fusion.  Still with some back issues.  Plans to start physical therapy at [facility].  Follow.

## 2013-12-14 NOTE — Assessment & Plan Note (Signed)
Breathing stable.  Follow.    

## 2013-12-14 NOTE — Assessment & Plan Note (Signed)
Last vitamin d level 3/14 - normal.

## 2013-12-14 NOTE — Assessment & Plan Note (Signed)
Colonoscopy 08/10/10 - tubular adenoma.  Negative for high grade dysplasia.  States recommended f/u in five years.    

## 2013-12-14 NOTE — Assessment & Plan Note (Signed)
Documented thyroid goiter.  No abnormality noted today on exam.  Check thyroid function.

## 2013-12-14 NOTE — Assessment & Plan Note (Signed)
Has been well controlled.  She monitors.    

## 2013-12-14 NOTE — Assessment & Plan Note (Signed)
S/p CABG 1998.  Currently doing well.  ECHO 11/28/10 revealed EF 55-60%, mild to moderate TR with RVSP 36 c/w mild pulmonary hypertension and no significant MR.    

## 2013-12-14 NOTE — Assessment & Plan Note (Signed)
Is s/p bilateral knee replacements and left hip replacement.  Exercises.  Follow.   

## 2013-12-16 ENCOUNTER — Encounter: Payer: Self-pay | Admitting: Emergency Medicine

## 2013-12-31 ENCOUNTER — Telehealth: Payer: Self-pay | Admitting: Emergency Medicine

## 2013-12-31 DIAGNOSIS — M549 Dorsalgia, unspecified: Secondary | ICD-10-CM

## 2013-12-31 NOTE — Telephone Encounter (Signed)
Pt lvm asking about referral to [facility] PT for core strengthing PT. No referral is in place. Please advise.    [facility] Fax # [phone]

## 2013-12-31 NOTE — Telephone Encounter (Signed)
Order placed for referral.  

## 2014-01-05 DIAGNOSIS — D485 Neoplasm of uncertain behavior of skin: Secondary | ICD-10-CM | POA: Diagnosis not present

## 2014-01-05 DIAGNOSIS — M48061 Spinal stenosis, lumbar region without neurogenic claudication: Secondary | ICD-10-CM | POA: Diagnosis not present

## 2014-01-05 DIAGNOSIS — Z85828 Personal history of other malignant neoplasm of skin: Secondary | ICD-10-CM | POA: Diagnosis not present

## 2014-01-05 DIAGNOSIS — L820 Inflamed seborrheic keratosis: Secondary | ICD-10-CM | POA: Diagnosis not present

## 2014-01-05 DIAGNOSIS — M549 Dorsalgia, unspecified: Secondary | ICD-10-CM | POA: Diagnosis not present

## 2014-01-05 DIAGNOSIS — Z85820 Personal history of malignant melanoma of skin: Secondary | ICD-10-CM | POA: Diagnosis not present

## 2014-01-05 DIAGNOSIS — L570 Actinic keratosis: Secondary | ICD-10-CM | POA: Diagnosis not present

## 2014-01-05 DIAGNOSIS — D0460 Carcinoma in situ of skin of unspecified upper limb, including shoulder: Secondary | ICD-10-CM | POA: Diagnosis not present

## 2014-01-06 DIAGNOSIS — M549 Dorsalgia, unspecified: Secondary | ICD-10-CM | POA: Diagnosis not present

## 2014-01-06 DIAGNOSIS — M48061 Spinal stenosis, lumbar region without neurogenic claudication: Secondary | ICD-10-CM | POA: Diagnosis not present

## 2014-01-07 DIAGNOSIS — M48061 Spinal stenosis, lumbar region without neurogenic claudication: Secondary | ICD-10-CM | POA: Diagnosis not present

## 2014-01-07 DIAGNOSIS — M549 Dorsalgia, unspecified: Secondary | ICD-10-CM | POA: Diagnosis not present

## 2014-01-11 ENCOUNTER — Other Ambulatory Visit: Payer: Self-pay | Admitting: *Deleted

## 2014-01-11 ENCOUNTER — Telehealth: Payer: Self-pay | Admitting: *Deleted

## 2014-01-11 DIAGNOSIS — M48061 Spinal stenosis, lumbar region without neurogenic claudication: Secondary | ICD-10-CM | POA: Diagnosis not present

## 2014-01-11 DIAGNOSIS — M549 Dorsalgia, unspecified: Secondary | ICD-10-CM | POA: Diagnosis not present

## 2014-01-11 MED ORDER — TRAZODONE HCL 150 MG PO TABS: ORAL_TABLET | ORAL | Status: DC

## 2014-01-11 MED ORDER — NABUMETONE 750 MG PO TABS: 750.0000 mg | ORAL_TABLET | Freq: Two times a day (BID) | ORAL | Status: DC

## 2014-01-11 NOTE — Telephone Encounter (Signed)
Pharmacy called requesting directions on the Trazadone. Left message for pt to return my call.

## 2014-01-11 NOTE — Telephone Encounter (Signed)
Pt return my call & directions updated & resent to pharmacy

## 2014-01-13 DIAGNOSIS — M549 Dorsalgia, unspecified: Secondary | ICD-10-CM | POA: Diagnosis not present

## 2014-01-13 DIAGNOSIS — M48061 Spinal stenosis, lumbar region without neurogenic claudication: Secondary | ICD-10-CM | POA: Diagnosis not present

## 2014-01-14 DIAGNOSIS — M549 Dorsalgia, unspecified: Secondary | ICD-10-CM | POA: Diagnosis not present

## 2014-01-14 DIAGNOSIS — M48061 Spinal stenosis, lumbar region without neurogenic claudication: Secondary | ICD-10-CM | POA: Diagnosis not present

## 2014-01-18 ENCOUNTER — Other Ambulatory Visit: Payer: Self-pay | Admitting: *Deleted

## 2014-01-18 MED ORDER — EZETIMIBE 10 MG PO TABS: 10.0000 mg | ORAL_TABLET | Freq: Every day | ORAL | Status: DC

## 2014-01-20 ENCOUNTER — Ambulatory Visit: Payer: Self-pay | Admitting: Internal Medicine

## 2014-01-20 DIAGNOSIS — Z1231 Encounter for screening mammogram for malignant neoplasm of breast: Secondary | ICD-10-CM | POA: Diagnosis not present

## 2014-01-20 LAB — HM MAMMOGRAPHY: HM Mammogram: NEGATIVE

## 2014-01-21 ENCOUNTER — Other Ambulatory Visit: Payer: Self-pay | Admitting: *Deleted

## 2014-01-21 MED ORDER — RAMIPRIL 10 MG PO CAPS: 10.0000 mg | ORAL_CAPSULE | Freq: Every day | ORAL | Status: DC

## 2014-01-22 ENCOUNTER — Encounter: Payer: Self-pay | Admitting: Internal Medicine

## 2014-02-01 ENCOUNTER — Encounter: Payer: Self-pay | Admitting: Internal Medicine

## 2014-02-04 ENCOUNTER — Other Ambulatory Visit: Payer: Self-pay | Admitting: *Deleted

## 2014-02-04 MED ORDER — SPIRONOLACTONE 25 MG PO TABS: 25.0000 mg | ORAL_TABLET | ORAL | Status: DC

## 2014-03-02 ENCOUNTER — Other Ambulatory Visit: Payer: Self-pay | Admitting: *Deleted

## 2014-03-02 MED ORDER — PANTOPRAZOLE SODIUM 40 MG PO TBEC: DELAYED_RELEASE_TABLET | ORAL | Status: DC

## 2014-03-18 ENCOUNTER — Other Ambulatory Visit: Payer: Self-pay | Admitting: *Deleted

## 2014-03-18 MED ORDER — VERAPAMIL HCL 180 MG (CO) PO TB24: 180.0000 mg | ORAL_TABLET | Freq: Two times a day (BID) | ORAL | Status: DC

## 2014-04-06 ENCOUNTER — Telehealth: Payer: Self-pay | Admitting: *Deleted

## 2014-04-06 ENCOUNTER — Other Ambulatory Visit: Payer: Self-pay | Admitting: *Deleted

## 2014-04-06 DIAGNOSIS — L820 Inflamed seborrheic keratosis: Secondary | ICD-10-CM | POA: Diagnosis not present

## 2014-04-06 DIAGNOSIS — L578 Other skin changes due to chronic exposure to nonionizing radiation: Secondary | ICD-10-CM | POA: Diagnosis not present

## 2014-04-06 DIAGNOSIS — L570 Actinic keratosis: Secondary | ICD-10-CM | POA: Diagnosis not present

## 2014-04-06 DIAGNOSIS — Z85828 Personal history of other malignant neoplasm of skin: Secondary | ICD-10-CM | POA: Diagnosis not present

## 2014-04-06 MED ORDER — NABUMETONE 750 MG PO TABS: 750.0000 mg | ORAL_TABLET | Freq: Two times a day (BID) | ORAL | Status: DC

## 2014-04-06 MED ORDER — NITROFURANTOIN MONOHYD MACRO 100 MG PO CAPS: ORAL_CAPSULE | ORAL | Status: DC

## 2014-04-06 NOTE — Telephone Encounter (Signed)
We will probably have to send in a Rx for her. She states it is written that way for her to take only once daily, but twice daily when symptomatic.

## 2014-04-06 NOTE — Telephone Encounter (Signed)
Pt notified via voice mail

## 2014-04-06 NOTE — Telephone Encounter (Signed)
rx sent in for macrobid.  Let me know if I need to do something else

## 2014-04-06 NOTE — Telephone Encounter (Signed)
We can do a prior authorization for macrobid prophylaxis (as a preventative measure for uti).  I would not recommend taking bid everyday.  If you can let me know what I need to complete for prior auth, I can do this.  I would recommend just q day dosing (at the most).

## 2014-04-06 NOTE — Telephone Encounter (Signed)
Pt states she has chronic UTI & she takes Macrobid 100mg  (take one up to twice a day #60 per month). Pt states that her urologist from [place] was that last one to fill it for her before she moved here. States that her pharmacy is now requiring a PA for it. Wants to know if we could do this for her. Please advise.

## 2014-04-07 ENCOUNTER — Telehealth: Payer: Self-pay | Admitting: *Deleted

## 2014-04-07 NOTE — Telephone Encounter (Signed)
PA initiated online for Macrobid 100 mg. Form printed and given to Dr. Nicki Reaper for signature.

## 2014-04-08 NOTE — Telephone Encounter (Signed)
Form faxed

## 2014-04-08 NOTE — Telephone Encounter (Signed)
Form completed & placed on Dr. Nicki Reaper workstation for [employer]

## 2014-04-08 NOTE — Telephone Encounter (Signed)
Form signed and placed in your box.  Thanks.  

## 2014-04-16 NOTE — Telephone Encounter (Signed)
Approval letter received & faxed to [facility]

## 2014-04-28 ENCOUNTER — Telehealth: Payer: Self-pay | Admitting: *Deleted

## 2014-04-28 NOTE — Telephone Encounter (Signed)
Please clarify with her how she is taking the medication.  If taking regluarly - ok to refill x 2

## 2014-04-28 NOTE — Telephone Encounter (Signed)
Fax from pharmacy requesting Trazodone 150 mg.

## 2014-04-28 NOTE — Telephone Encounter (Signed)
Fax from pharmacy requesting Trazodone 150mg  #60.  Last OV 3.6.15.  Please advise refill

## 2014-04-28 NOTE — Telephone Encounter (Signed)
Left message on VM for pt to return call.

## 2014-04-30 ENCOUNTER — Other Ambulatory Visit: Payer: Self-pay | Admitting: *Deleted

## 2014-04-30 MED ORDER — TRAZODONE HCL 150 MG PO TABS: ORAL_TABLET | ORAL | Status: DC

## 2014-04-30 NOTE — Telephone Encounter (Signed)
Pt returned call & reports that she takes is exactly as it says on her bottle (1 2/3-2) at bedtime. Rx sent to pharmacy & pt notified

## 2014-05-05 ENCOUNTER — Other Ambulatory Visit: Payer: Self-pay | Admitting: *Deleted

## 2014-05-05 MED ORDER — AMITRIPTYLINE HCL 25 MG PO TABS: ORAL_TABLET | ORAL | Status: DC

## 2014-06-15 ENCOUNTER — Ambulatory Visit (INDEPENDENT_AMBULATORY_CARE_PROVIDER_SITE_OTHER): Payer: Medicare Other | Admitting: Internal Medicine

## 2014-06-15 ENCOUNTER — Encounter: Payer: Self-pay | Admitting: Internal Medicine

## 2014-06-15 VITALS — BP 130/70 | HR 60 | Temp 97.9°F | Ht 64.5 in | Wt 193.0 lb

## 2014-06-15 DIAGNOSIS — J45909 Unspecified asthma, uncomplicated: Secondary | ICD-10-CM

## 2014-06-15 DIAGNOSIS — F3289 Other specified depressive episodes: Secondary | ICD-10-CM | POA: Diagnosis not present

## 2014-06-15 DIAGNOSIS — E559 Vitamin D deficiency, unspecified: Secondary | ICD-10-CM

## 2014-06-15 DIAGNOSIS — Z23 Encounter for immunization: Secondary | ICD-10-CM | POA: Diagnosis not present

## 2014-06-15 DIAGNOSIS — IMO0002 Reserved for concepts with insufficient information to code with codable children: Secondary | ICD-10-CM

## 2014-06-15 DIAGNOSIS — L989 Disorder of the skin and subcutaneous tissue, unspecified: Secondary | ICD-10-CM

## 2014-06-15 DIAGNOSIS — K219 Gastro-esophageal reflux disease without esophagitis: Secondary | ICD-10-CM | POA: Diagnosis not present

## 2014-06-15 DIAGNOSIS — Z860100 Personal history of colon polyps, unspecified: Secondary | ICD-10-CM

## 2014-06-15 DIAGNOSIS — E049 Nontoxic goiter, unspecified: Secondary | ICD-10-CM

## 2014-06-15 DIAGNOSIS — I10 Essential (primary) hypertension: Secondary | ICD-10-CM | POA: Diagnosis not present

## 2014-06-15 DIAGNOSIS — M4800 Spinal stenosis, site unspecified: Secondary | ICD-10-CM

## 2014-06-15 DIAGNOSIS — F329 Major depressive disorder, single episode, unspecified: Secondary | ICD-10-CM

## 2014-06-15 DIAGNOSIS — I2510 Atherosclerotic heart disease of native coronary artery without angina pectoris: Secondary | ICD-10-CM

## 2014-06-15 DIAGNOSIS — F32A Depression, unspecified: Secondary | ICD-10-CM

## 2014-06-15 DIAGNOSIS — J4520 Mild intermittent asthma, uncomplicated: Secondary | ICD-10-CM

## 2014-06-15 DIAGNOSIS — M179 Osteoarthritis of knee, unspecified: Secondary | ICD-10-CM

## 2014-06-15 DIAGNOSIS — E7800 Pure hypercholesterolemia, unspecified: Secondary | ICD-10-CM | POA: Diagnosis not present

## 2014-06-15 DIAGNOSIS — M1710 Unilateral primary osteoarthritis, unspecified knee: Secondary | ICD-10-CM

## 2014-06-15 DIAGNOSIS — K449 Diaphragmatic hernia without obstruction or gangrene: Secondary | ICD-10-CM

## 2014-06-15 LAB — CBC WITH DIFFERENTIAL/PLATELET
BASOS PCT: 0.6 % (ref 0.0–3.0)
Basophils Absolute: 0 10*3/uL (ref 0.0–0.1)
EOS PCT: 3.7 % (ref 0.0–5.0)
Eosinophils Absolute: 0.1 10*3/uL (ref 0.0–0.7)
HCT: 39 % (ref 36.0–46.0)
HEMOGLOBIN: 13.3 g/dL (ref 12.0–15.0)
Lymphocytes Relative: 20.7 % (ref 12.0–46.0)
Lymphs Abs: 0.8 10*3/uL (ref 0.7–4.0)
MCHC: 34.1 g/dL (ref 30.0–36.0)
MCV: 100.2 fl — AB (ref 78.0–100.0)
MONO ABS: 0.4 10*3/uL (ref 0.1–1.0)
Monocytes Relative: 11.2 % (ref 3.0–12.0)
NEUTROS ABS: 2.5 10*3/uL (ref 1.4–7.7)
Neutrophils Relative %: 63.8 % (ref 43.0–77.0)
Platelets: 224 10*3/uL (ref 150.0–400.0)
RBC: 3.9 Mil/uL (ref 3.87–5.11)
RDW: 12.8 % (ref 11.5–15.5)
WBC: 4 10*3/uL (ref 4.0–10.5)

## 2014-06-15 LAB — LIPID PANEL
CHOL/HDL RATIO: 5
Cholesterol: 244 mg/dL — ABNORMAL HIGH (ref 0–200)
HDL: 46.8 mg/dL (ref >39.00)
LDL Cholesterol: 170 mg/dL — ABNORMAL HIGH (ref 0–99)
NONHDL: 197.2
TRIGLYCERIDES: 137 mg/dL (ref 0.0–149.0)
VLDL: 27.4 mg/dL (ref 0.0–40.0)

## 2014-06-15 LAB — BASIC METABOLIC PANEL
BUN: 25 mg/dL — AB (ref 6–23)
CO2: 29 mEq/L (ref 19–32)
Calcium: 9.9 mg/dL (ref 8.4–10.5)
Chloride: 100 mEq/L (ref 96–112)
Creatinine, Ser: 1 mg/dL (ref 0.4–1.2)
GFR: 54.73 mL/min — AB (ref >60.00)
GLUCOSE: 93 mg/dL (ref 70–99)
Potassium: 4.6 mEq/L (ref 3.5–5.1)
SODIUM: 136 meq/L (ref 135–145)

## 2014-06-15 LAB — TSH: TSH: 0.73 u[IU]/mL (ref 0.35–4.50)

## 2014-06-15 LAB — HEPATIC FUNCTION PANEL
ALT: 17 U/L (ref 0–35)
AST: 25 U/L (ref 0–37)
Albumin: 4.2 g/dL (ref 3.5–5.2)
Alkaline Phosphatase: 46 U/L (ref 39–117)
BILIRUBIN DIRECT: 0 mg/dL (ref 0.0–0.3)
Total Bilirubin: 0.6 mg/dL (ref 0.2–1.2)
Total Protein: 7.1 g/dL (ref 6.0–8.3)

## 2014-06-15 MED ORDER — KETOCONAZOLE 2 % EX CREA: 1.0000 "application " | TOPICAL_CREAM | Freq: Every day | CUTANEOUS | Status: DC

## 2014-06-15 NOTE — Assessment & Plan Note (Signed)
S/p CABG 1998.  Currently doing well.  ECHO 11/28/10 revealed EF 55-60%, mild to moderate TR with RVSP 36 c/w mild pulmonary hypertension and no significant MR.

## 2014-06-15 NOTE — Progress Notes (Signed)
Subjective:    Patient ID: Charlotte Wagner, female    DOB: April 05, 1936, 78 y.o.   MRN: [id number]  HPI 78 year old female with past history of CAD s/p CABG 1998, hypertension, hypercholesterolemia, depression, lichen planus and GERD.  She comes in today for a scheduled follow up.  She states overall she is doing relatively well.  Exercising.  Riding her bike and going to water aerobics.  Did go to physical therapy.  Walking with a cane now.  Does help.  Eating and drinking well.  No increased cough and congestion.  Breathing stable.  Uses atrovent prn (rarely).   Bowels stable.  She stopped simvastatin.  Had intolerance.  Her main issue is back pain and back and leg weakness.  She feels overall stable.  Still doing her exercise.  Desires no further intervention at this time.     Past Medical History  Diagnosis Date  . Asthma   . Arthritis     s/p bilateral knees and left hip replacement  . Depression   . History of chicken pox   . Cancer     melanoma right arm  . GERD (gastroesophageal reflux disease)     h/o hiatal hernia  . Allergy   . Arrhythmia     rare PVC's  . Heart murmur   . CAD (coronary artery disease)     s/p CABG 12/1996  . Hypertension   . Hyperlipidemia   . Chronic kidney disease   . Hx of migraines     rare now  . Colon polyps     H/O  . Spinal stenosis   . Hx: UTI (urinary tract infection)   . Urine incontinence     H/O  . PUD (peptic ulcer disease)     remote history  . Lichen planus   . Vitamin D deficiency   . Raynaud's phenomenon   . PMR (polymyalgia rheumatica)     h/o    Outpatient Encounter Prescriptions as of 06/15/2014  Medication Sig  . Acetaminophen (TYLENOL EXTRA STRENGTH PO) Take by mouth.  [name] amitriptyline (ELAVIL) 25 MG tablet Take 1-2 tablets q hs  . b complex vitamins tablet Take 1 tablet by mouth daily.  . bisoprolol (ZEBETA) 5 MG tablet Take 1/2 tablet q day  . Cholecalciferol (VITAMIN D3) 2000 UNITS TABS Take by mouth.  . clarithromycin  (BIAXIN XL) 500 MG 24 hr tablet Take 2 tablets (1,000 mg total) by mouth daily.  . Coenzyme Q10 (CO Q 10 PO) Take by mouth.  . ezetimibe (ZETIA) 10 MG tablet Take 1 tablet (10 mg total) by mouth daily.  . Fluticasone-Salmeterol (ADVAIR DISKUS) 250-50 MCG/DOSE AEPB Inhale 1 puff into the lungs 2 (two) times daily.  . folic acid (FOLVITE) 1 MG tablet Take 1 tablet (1 mg total) by mouth daily.  . hydrocortisone 2.5 % cream Apply 1 application topically 2 (two) times daily at 10 am and 4 pm. Apply to vulva  . ipratropium (ATROVENT HFA) 17 MCG/ACT inhaler Inhale 2 puffs into the lungs every 6 (six) hours as needed for wheezing.  . levalbuterol (XOPENEX HFA) 45 MCG/ACT inhaler Inhale 2 puffs into the lungs every 6 (six) hours as needed for wheezing.  [name] LYSINE PO Take by mouth.  . nabumetone (RELAFEN) 750 MG tablet Take 1 tablet (750 mg total) by mouth 2 (two) times daily.  . nitrofurantoin, macrocrystal-monohydrate, (MACROBID) 100 MG capsule One q day to bid as directed.  . Omega-3 Fatty Acids (FISH OIL PO)  Take 2,000 mg by mouth daily.  . pantoprazole (PROTONIX) 40 MG tablet take 1 tablet by mouth twice a day  . Polyethylene Glycol 3350 (MIRALAX PO) Take by mouth as needed.  . Prenatal Vit-Fe Fumarate-FA (M-VIT PO) Take by mouth daily.  . ramipril (ALTACE) 10 MG capsule Take 1 capsule (10 mg total) by mouth daily.  [name] spironolactone (ALDACTONE) 25 MG tablet Take 1 tablet (25 mg total) by mouth every morning.  . traZODone (DESYREL) 150 MG tablet Take 1 & 2/3-2 tablets QHS  . verapamil (COVERA HS) 180 MG (CO) 24 hr tablet Take 1 tablet (180 mg total) by mouth 2 (two) times daily.  . vitamin B-12 (CYANOCOBALAMIN) 1000 MCG tablet Take 1,000 mcg by mouth daily.  . vitamin C (ASCORBIC ACID) 500 MG tablet Take 500 mg by mouth daily.  . [DISCONTINUED] folic acid (FOLVITE) 1 MG tablet Take 1 tablet (1 mg total) by mouth daily.  . [DISCONTINUED] simvastatin (ZOCOR) 10 MG tablet Take 10 mg by mouth. Only on  Mon, Wed, & Fri    Review of Systems Patient denies any headache, lightheadedness or dizziness.  No sinus or allergy symptoms.   No chest pain, tightness or palpitations.  No increased shortness of breath.  No increased cough.  Breathing stable.  No nausea or vomiting.  No abdominal pain or cramping.  No acid reflux problems reported.  No significant bowel change, such as diarrhea.  Right arm lesion removed.  Was a squamous cell.  Following with dermatology.  Back and leg issues as outlined.  Is exercising.         Objective:   Physical Exam  Filed Vitals:   06/15/14 1005  BP: 130/70  Pulse: 60  Temp: 97.9 F (36.6 C)    blood pressure recheck:  32/74  78 year old female in no acute distress.   HEENT:  Nares- clear.  Oropharynx - without lesions. NECK:  Supple.  Nontender.  No audible bruit.  HEART:  Appears to be regular. LUNGS:  No crackles or wheezing audible.  Respirations even and unlabored.  RADIAL PULSE:  Equal bilaterally. ABDOMEN:  Soft, nontender.  Bowel sounds present and normal.  No audible abdominal bruit.    EXTREMITIES:  No increased edema present.  DP pulses palpable and equal bilaterally.          Assessment & Plan:  HEALTH MAINTENANCE.  Physical 3//6/15.  Colonoscopy 07/2010 with polyps.  Recommended f/u colonoscopy in five years.  Mammogram 01/20/14 - Birads I.     I spent 25 minutes with the patient and more than 50% of the time was spent in consultation regarding the above.

## 2014-06-15 NOTE — Assessment & Plan Note (Addendum)
Stable on current regimen.  Follow.   

## 2014-06-15 NOTE — Assessment & Plan Note (Signed)
Has been well controlled.  She monitors.

## 2014-06-15 NOTE — Assessment & Plan Note (Addendum)
Low cholesterol diet and exercise.  Off medication.  Had intolerance to simvastatin.  Follow lipid panel.  Check today.  If elevated, will try pravastatin.  She is in agreement.

## 2014-06-15 NOTE — Progress Notes (Signed)
Pre visit review using our clinic review tool, if applicable. No additional management support is needed unless otherwise documented below in the visit note. 

## 2014-06-15 NOTE — Assessment & Plan Note (Addendum)
Documented thyroid goiter.  No abnormality noted today on exam.  Follow thyroid function.

## 2014-06-15 NOTE — Assessment & Plan Note (Signed)
Symptoms controlled.  Follow.  Has a remote history of PUD.

## 2014-06-16 ENCOUNTER — Other Ambulatory Visit: Payer: Self-pay | Admitting: Internal Medicine

## 2014-06-16 ENCOUNTER — Other Ambulatory Visit: Payer: Self-pay | Admitting: *Deleted

## 2014-06-16 DIAGNOSIS — E7800 Pure hypercholesterolemia, unspecified: Secondary | ICD-10-CM

## 2014-06-16 MED ORDER — PRAVASTATIN SODIUM 10 MG PO TABS: 10.0000 mg | ORAL_TABLET | ORAL | Status: DC

## 2014-06-16 NOTE — Progress Notes (Signed)
Order placed for f/u liver panel.  

## 2014-06-20 ENCOUNTER — Encounter: Payer: Self-pay | Admitting: Internal Medicine

## 2014-06-20 NOTE — Assessment & Plan Note (Signed)
Appears to be stable.  Follow.   

## 2014-06-20 NOTE — Assessment & Plan Note (Signed)
Colonoscopy 08/10/10 - tubular adenoma.  Negative for high grade dysplasia.  States recommended f/u in five years.

## 2014-06-20 NOTE — Assessment & Plan Note (Signed)
Is s/p bilateral knee replacements and left hip replacement.  Exercises.  Follow.

## 2014-06-20 NOTE — Assessment & Plan Note (Signed)
Breathing stable.  Follow.  Uses atrovent prn.  Follow.  Overall she feels she is doing well.

## 2014-06-20 NOTE — Assessment & Plan Note (Signed)
Follow vitamin D level.  

## 2014-06-20 NOTE — Assessment & Plan Note (Signed)
Removed.  Squamous cell removed.  Continue to f/u with dermatology.

## 2014-06-20 NOTE — Assessment & Plan Note (Signed)
S/p lumbar fusion.  Still with some back issues.  Had physical therapy.  Doing exercise.  Wants to follow for now.  Discussed referral to discuss further treatment options.

## 2014-07-02 DIAGNOSIS — H2510 Age-related nuclear cataract, unspecified eye: Secondary | ICD-10-CM | POA: Diagnosis not present

## 2014-07-12 ENCOUNTER — Telehealth: Payer: Self-pay | Admitting: *Deleted

## 2014-07-12 NOTE — Telephone Encounter (Signed)
We are not able to call in abx now - per policy.  I will need to see her.

## 2014-07-12 NOTE — Telephone Encounter (Signed)
Left a detailed message on pts VM.  However, I offered pt an appoint when I originally spoke with her and she declined.  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  Please advise

## 2014-07-12 NOTE — Telephone Encounter (Signed)
Confirm no nausea or vomiting, etc.  If any acute problems let me know, otherwise let her know we are unable now to call in abx.  Can she come in this pm.  See if she can come in 4:30 today for work in for this.  Will need urine priior to me seeing.

## 2014-07-12 NOTE — Telephone Encounter (Signed)
Spoke with pt, she states she is having a bladder infection flare.  She is requesting Cipro 500mg  BID #14 for current symptoms and Nitrofurantoin Mono-MCG 100mg  #60 to take prophylacticly.  She further states she takes Nitrofurantoin Mono-MCG 100mg  daily for chronic bladder infections.  Please advise

## 2014-07-13 ENCOUNTER — Other Ambulatory Visit: Payer: Self-pay | Admitting: Internal Medicine

## 2014-07-13 MED ORDER — NABUMETONE 750 MG PO TABS: 750.0000 mg | ORAL_TABLET | Freq: Two times a day (BID) | ORAL | Status: DC

## 2014-07-13 NOTE — Telephone Encounter (Signed)
Spoke with pt advised of MDs message.  Pt verbalized understanding. 

## 2014-07-19 ENCOUNTER — Other Ambulatory Visit: Payer: Self-pay | Admitting: *Deleted

## 2014-07-19 MED ORDER — EZETIMIBE 10 MG PO TABS: 10.0000 mg | ORAL_TABLET | Freq: Every day | ORAL | Status: DC

## 2014-07-22 ENCOUNTER — Other Ambulatory Visit: Payer: Self-pay | Admitting: *Deleted

## 2014-07-22 MED ORDER — RAMIPRIL 10 MG PO CAPS: 10.0000 mg | ORAL_CAPSULE | Freq: Every day | ORAL | Status: DC

## 2014-08-05 ENCOUNTER — Other Ambulatory Visit: Payer: Self-pay | Admitting: *Deleted

## 2014-08-05 MED ORDER — AMITRIPTYLINE HCL 25 MG PO TABS: ORAL_TABLET | ORAL | Status: DC

## 2014-08-05 MED ORDER — SPIRONOLACTONE 25 MG PO TABS: 25.0000 mg | ORAL_TABLET | ORAL | Status: DC

## 2014-08-09 DIAGNOSIS — B370 Candidal stomatitis: Secondary | ICD-10-CM | POA: Diagnosis not present

## 2014-08-09 DIAGNOSIS — L438 Other lichen planus: Secondary | ICD-10-CM | POA: Diagnosis not present

## 2014-08-10 DIAGNOSIS — B370 Candidal stomatitis: Secondary | ICD-10-CM | POA: Diagnosis not present

## 2014-08-16 ENCOUNTER — Ambulatory Visit (INDEPENDENT_AMBULATORY_CARE_PROVIDER_SITE_OTHER): Payer: Medicare Other | Admitting: Internal Medicine

## 2014-08-16 ENCOUNTER — Telehealth: Payer: Self-pay | Admitting: Internal Medicine

## 2014-08-16 ENCOUNTER — Encounter: Payer: Self-pay | Admitting: Internal Medicine

## 2014-08-16 ENCOUNTER — Telehealth: Payer: Self-pay

## 2014-08-16 VITALS — BP 124/70 | HR 72 | Temp 98.0°F | Ht 64.5 in | Wt 190.5 lb

## 2014-08-16 DIAGNOSIS — E7800 Pure hypercholesterolemia, unspecified: Secondary | ICD-10-CM

## 2014-08-16 DIAGNOSIS — J209 Acute bronchitis, unspecified: Secondary | ICD-10-CM

## 2014-08-16 DIAGNOSIS — I2510 Atherosclerotic heart disease of native coronary artery without angina pectoris: Secondary | ICD-10-CM | POA: Diagnosis not present

## 2014-08-16 MED ORDER — FLUTICASONE PROPIONATE HFA 110 MCG/ACT IN AERO: 2.0000 | INHALATION_SPRAY | Freq: Two times a day (BID) | RESPIRATORY_TRACT | Status: DC

## 2014-08-16 MED ORDER — LEVALBUTEROL TARTRATE 45 MCG/ACT IN AERO: 2.0000 | INHALATION_SPRAY | Freq: Four times a day (QID) | RESPIRATORY_TRACT | Status: DC | PRN

## 2014-08-16 MED ORDER — CLARITHROMYCIN ER 500 MG PO TB24: ORAL_TABLET | ORAL | Status: DC

## 2014-08-16 NOTE — Telephone Encounter (Signed)
Pt states that she feels that she has bronchitis & doesn't want it to turn into pneumonia. Pt coming in for work in appointment today @ 1:15. Pt aware that this will be a quick work in visit.

## 2014-08-16 NOTE — Progress Notes (Signed)
Pre visit review using our clinic review tool, if applicable. No additional management support is needed unless otherwise documented below in the visit note. 

## 2014-08-16 NOTE — Telephone Encounter (Signed)
The patient called hoping to get an antibiotic called in for possible pneumonia.  She states she usually is prescribed a medication so her condition does not worsen.      Callback - [phone]

## 2014-08-17 ENCOUNTER — Encounter: Payer: Self-pay | Admitting: Internal Medicine

## 2014-08-17 NOTE — Progress Notes (Signed)
Subjective:    Patient ID: Charlotte Wagner, female    DOB: July 17, 1936, 78 y.o.   MRN: [id number]  Cough  78 year old female with past history of CAD s/p CABG 1998, hypertension, hypercholesterolemia, depression, lichen planus and GERD.  She comes in today as a work in with concerns regarding increased cough and congestion.   States she had a cold two weeks ago.  Symptoms worsened one week ago.  No sinus pressure.  Runny nose.  Previous sore throat.  Resolved.  Increased cough.  Productive cough - colored mucus.  No sob.  No tightness.  Using her inhalers.  No nausea or vomiting.  No diarrhea.     Past Medical History  Diagnosis Date  . Asthma   . Arthritis     s/p bilateral knees and left hip replacement  . Depression   . History of chicken pox   . Cancer     melanoma right arm  . GERD (gastroesophageal reflux disease)     h/o hiatal hernia  . Allergy   . Arrhythmia     rare PVC's  . Heart murmur   . CAD (coronary artery disease)     s/p CABG 12/1996  . Hypertension   . Hyperlipidemia   . Chronic kidney disease   . Hx of migraines     rare now  . Colon polyps     H/O  . Spinal stenosis   . Hx: UTI (urinary tract infection)   . Urine incontinence     H/O  . PUD (peptic ulcer disease)     remote history  . Lichen planus   . Vitamin D deficiency   . Raynaud's phenomenon   . PMR (polymyalgia rheumatica)     h/o    Outpatient Encounter Prescriptions as of 08/16/2014  Medication Sig  . Acetaminophen (TYLENOL EXTRA STRENGTH PO) Take by mouth.  [name] amitriptyline (ELAVIL) 25 MG tablet Take 1-2 tablets q hs  . b complex vitamins tablet Take 1 tablet by mouth daily.  . bisoprolol (ZEBETA) 5 MG tablet Take 1/2 tablet q day  . Cholecalciferol (VITAMIN D3) 2000 UNITS TABS Take by mouth.  . Coenzyme Q10 (CO Q 10 PO) Take by mouth.  . ezetimibe (ZETIA) 10 MG tablet Take 1 tablet (10 mg total) by mouth daily.  . fluconazole (DIFLUCAN) 200 MG tablet Take 200 mg by mouth daily.  . folic  acid (FOLVITE) 1 MG tablet Take 1 tablet (1 mg total) by mouth daily.  . hydrocortisone 2.5 % cream Apply 1 application topically 2 (two) times daily at 10 am and 4 pm. Apply to vulva  . ipratropium (ATROVENT HFA) 17 MCG/ACT inhaler Inhale 2 puffs into the lungs every 6 (six) hours as needed for wheezing.  [name] ketoconazole (NIZORAL) 2 % cream Apply 1 application topically daily.  [name] levalbuterol (XOPENEX HFA) 45 MCG/ACT inhaler Inhale 2 puffs into the lungs every 6 (six) hours as needed for wheezing.  [name] LYSINE PO Take by mouth.  . nabumetone (RELAFEN) 750 MG tablet Take 1 tablet (750 mg total) by mouth 2 (two) times daily.  . Omega-3 Fatty Acids (FISH OIL PO) Take 2,000 mg by mouth daily.  . pantoprazole (PROTONIX) 40 MG tablet take 1 tablet by mouth twice a day  . Polyethylene Glycol 3350 (MIRALAX PO) Take by mouth as needed.  . pravastatin (PRAVACHOL) 10 MG tablet Take 1 tablet (10 mg total) by mouth every Monday, Wednesday, and Friday.  . Prenatal Vit-Fe Fumarate-FA (M-VIT  PO) Take by mouth daily.  . ramipril (ALTACE) 10 MG capsule Take 1 capsule (10 mg total) by mouth daily.  [name] spironolactone (ALDACTONE) 25 MG tablet Take 1 tablet (25 mg total) by mouth every morning.  . traZODone (DESYREL) 150 MG tablet Take 1 & 2/3-2 tablets QHS  . verapamil (COVERA HS) 180 MG (CO) 24 hr tablet Take 1 tablet (180 mg total) by mouth 2 (two) times daily.  . vitamin B-12 (CYANOCOBALAMIN) 1000 MCG tablet Take 1,000 mcg by mouth daily.  . vitamin C (ASCORBIC ACID) 500 MG tablet Take 500 mg by mouth daily.  . [DISCONTINUED] Fluticasone-Salmeterol (ADVAIR DISKUS) 250-50 MCG/DOSE AEPB Inhale 1 puff into the lungs 2 (two) times daily. (Patient taking differently: Inhale 1 puff into the lungs 2 (two) times daily as needed. )  . [DISCONTINUED] levalbuterol (XOPENEX HFA) 45 MCG/ACT inhaler Inhale 2 puffs into the lungs every 6 (six) hours as needed for wheezing.  . clarithromycin (BIAXIN XL) 500 MG 24 hr tablet One tablet  twice a day  . fluticasone (FLOVENT HFA) 110 MCG/ACT inhaler Inhale 2 puffs into the lungs 2 (two) times daily.  . [DISCONTINUED] clarithromycin (BIAXIN XL) 500 MG 24 hr tablet Take 2 tablets (1,000 mg total) by mouth daily.  . [DISCONTINUED] nitrofurantoin, macrocrystal-monohydrate, (MACROBID) 100 MG capsule One q day to bid as directed.    Review of Systems  Respiratory: Positive for cough.   Patient denies any headache, lightheadedness or dizziness.  No sinus pressure.   No chest pain, tightness or palpitations.  No increased shortness of breath.  Does report the increased cough and congestion.  No fever.  No nausea or vomiting.  No diarrhea.         Objective:   Physical Exam  Filed Vitals:   08/16/14 1308  BP: 124/70  Pulse: 72  Temp: 98 F (42.66 C)   78 year old female in no acute distress.   HEENT:  Nares- clear.  Oropharynx - without lesions. NECK:  Supple.  Nontender.  No audible bruit.  HEART:  Appears to be regular. LUNGS:  No crackles or wheezing audible.  Respirations even and unlabored.  Some increased cough with forced expiration.        Assessment & Plan:  1. Hypercholesterolemia Will hold her pravastatin while treating the infection.    2. Acute bronchitis, unspecified organism Symptoms and exam as outlined.  Continue inhalers.  Change advair to flovent.   zpak as directed.  Saline nasal spray and nasacort as directed.  Robitussin DM as directed.    HEALTH MAINTENANCE.  Physical 3//6/15.  Colonoscopy 07/2010 with polyps.  Recommended f/u colonoscopy in five years.  Mammogram 01/20/14 - Birads I.

## 2014-08-19 ENCOUNTER — Other Ambulatory Visit: Payer: Medicare Other

## 2014-08-23 ENCOUNTER — Other Ambulatory Visit: Payer: Self-pay | Admitting: *Deleted

## 2014-08-23 MED ORDER — TRAZODONE HCL 150 MG PO TABS: ORAL_TABLET | ORAL | Status: DC

## 2014-08-23 MED ORDER — PANTOPRAZOLE SODIUM 40 MG PO TBEC: DELAYED_RELEASE_TABLET | ORAL | Status: DC

## 2014-08-23 NOTE — Addendum Note (Signed)
Addended by: Wynonia Lawman E on: 08/23/2014 10:22 AM   Modules accepted: Orders

## 2014-09-13 ENCOUNTER — Other Ambulatory Visit: Payer: Self-pay | Admitting: *Deleted

## 2014-09-13 MED ORDER — VERAPAMIL HCL 180 MG (CO) PO TB24: 180.0000 mg | ORAL_TABLET | Freq: Two times a day (BID) | ORAL | Status: DC

## 2014-09-20 DIAGNOSIS — B370 Candidal stomatitis: Secondary | ICD-10-CM | POA: Diagnosis not present

## 2014-09-20 DIAGNOSIS — L433 Subacute (active) lichen planus: Secondary | ICD-10-CM | POA: Diagnosis not present

## 2014-09-22 DIAGNOSIS — B370 Candidal stomatitis: Secondary | ICD-10-CM | POA: Diagnosis not present

## 2014-09-30 ENCOUNTER — Other Ambulatory Visit: Payer: Self-pay | Admitting: *Deleted

## 2014-09-30 MED ORDER — NABUMETONE 750 MG PO TABS: 750.0000 mg | ORAL_TABLET | Freq: Two times a day (BID) | ORAL | Status: DC

## 2014-10-15 ENCOUNTER — Other Ambulatory Visit: Payer: Medicare Other

## 2014-10-18 DIAGNOSIS — L439 Lichen planus, unspecified: Secondary | ICD-10-CM | POA: Diagnosis not present

## 2014-10-20 DIAGNOSIS — L433 Subacute (active) lichen planus: Secondary | ICD-10-CM | POA: Diagnosis not present

## 2014-10-27 ENCOUNTER — Other Ambulatory Visit: Payer: Self-pay

## 2014-10-27 MED ORDER — AMITRIPTYLINE HCL 25 MG PO TABS: ORAL_TABLET | ORAL | Status: DC

## 2014-11-05 DIAGNOSIS — I10 Essential (primary) hypertension: Secondary | ICD-10-CM | POA: Diagnosis not present

## 2014-11-05 DIAGNOSIS — Z87891 Personal history of nicotine dependence: Secondary | ICD-10-CM | POA: Diagnosis not present

## 2014-11-05 DIAGNOSIS — L03116 Cellulitis of left lower limb: Secondary | ICD-10-CM | POA: Diagnosis not present

## 2014-11-05 DIAGNOSIS — M79605 Pain in left leg: Secondary | ICD-10-CM | POA: Diagnosis not present

## 2014-11-05 DIAGNOSIS — L0390 Cellulitis, unspecified: Secondary | ICD-10-CM | POA: Diagnosis not present

## 2014-11-13 DIAGNOSIS — L0390 Cellulitis, unspecified: Secondary | ICD-10-CM | POA: Diagnosis not present

## 2014-11-13 DIAGNOSIS — I10 Essential (primary) hypertension: Secondary | ICD-10-CM | POA: Diagnosis not present

## 2014-11-13 DIAGNOSIS — L03116 Cellulitis of left lower limb: Secondary | ICD-10-CM | POA: Diagnosis not present

## 2014-11-13 DIAGNOSIS — Z760 Encounter for issue of repeat prescription: Secondary | ICD-10-CM | POA: Diagnosis not present

## 2014-11-18 ENCOUNTER — Encounter: Payer: Self-pay | Admitting: *Deleted

## 2014-11-18 ENCOUNTER — Other Ambulatory Visit (INDEPENDENT_AMBULATORY_CARE_PROVIDER_SITE_OTHER): Payer: Medicare Other

## 2014-11-18 DIAGNOSIS — E7800 Pure hypercholesterolemia, unspecified: Secondary | ICD-10-CM

## 2014-11-18 LAB — HEPATIC FUNCTION PANEL
ALT: 20 U/L (ref 0–35)
AST: 24 U/L (ref 0–37)
Albumin: 4 g/dL (ref 3.5–5.2)
Alkaline Phosphatase: 60 U/L (ref 39–117)
Bilirubin, Direct: 0.1 mg/dL (ref 0.0–0.3)
Total Bilirubin: 0.5 mg/dL (ref 0.2–1.2)
Total Protein: 6.8 g/dL (ref 6.0–8.3)

## 2014-11-22 ENCOUNTER — Other Ambulatory Visit: Payer: Self-pay | Admitting: *Deleted

## 2014-11-22 MED ORDER — PRAVASTATIN SODIUM 10 MG PO TABS: 10.0000 mg | ORAL_TABLET | ORAL | Status: DC

## 2014-11-29 ENCOUNTER — Telehealth: Payer: Self-pay | Admitting: Internal Medicine

## 2014-11-29 NOTE — Telephone Encounter (Signed)
The patient is questioning why a lipid profile was not done when she had labs drawn on 2.11.16. Please advise .

## 2014-11-30 ENCOUNTER — Encounter: Payer: Self-pay | Admitting: *Deleted

## 2014-11-30 ENCOUNTER — Other Ambulatory Visit: Payer: Self-pay | Admitting: Internal Medicine

## 2014-11-30 DIAGNOSIS — I10 Essential (primary) hypertension: Secondary | ICD-10-CM

## 2014-11-30 DIAGNOSIS — E7800 Pure hypercholesterolemia, unspecified: Secondary | ICD-10-CM

## 2014-11-30 NOTE — Progress Notes (Signed)
Orders placed for labs

## 2014-11-30 NOTE — Telephone Encounter (Signed)
When the lab was originally ordered, it was after the 9/15 lab and was a liver panel to be checked 6 weeks after starting the medication.  This lab appt never occurred, so when she came in - [facility] just drew the liver panel (because that was what was ordered from the previous plan).  I will add orders for a cholesterol panel, but pt will need to come in to be redrawn.  Sorry for the inconvenience.  Our mistake.

## 2014-11-30 NOTE — Telephone Encounter (Signed)
Tried to reach patient, no answer or voicemail. Mailed letter with response

## 2014-12-02 ENCOUNTER — Other Ambulatory Visit (INDEPENDENT_AMBULATORY_CARE_PROVIDER_SITE_OTHER): Payer: Medicare Other

## 2014-12-02 DIAGNOSIS — I10 Essential (primary) hypertension: Secondary | ICD-10-CM | POA: Diagnosis not present

## 2014-12-02 DIAGNOSIS — E7800 Pure hypercholesterolemia, unspecified: Secondary | ICD-10-CM

## 2014-12-02 LAB — BASIC METABOLIC PANEL
BUN: 24 mg/dL — ABNORMAL HIGH (ref 6–23)
CALCIUM: 9.8 mg/dL (ref 8.4–10.5)
CO2: 28 mEq/L (ref 19–32)
Chloride: 101 mEq/L (ref 96–112)
Creatinine, Ser: 1.09 mg/dL (ref 0.40–1.20)
GFR: 51.78 mL/min — AB (ref >60.00)
Glucose, Bld: 102 mg/dL — ABNORMAL HIGH (ref 70–99)
Potassium: 4.6 mEq/L (ref 3.5–5.1)
Sodium: 135 mEq/L (ref 135–145)

## 2014-12-02 LAB — LIPID PANEL
CHOLESTEROL: 193 mg/dL (ref 0–200)
HDL: 50 mg/dL (ref >39.00)
LDL Cholesterol: 120 mg/dL — ABNORMAL HIGH (ref 0–99)
NonHDL: 143
Total CHOL/HDL Ratio: 4
Triglycerides: 114 mg/dL (ref 0.0–149.0)
VLDL: 22.8 mg/dL (ref 0.0–40.0)

## 2014-12-03 ENCOUNTER — Encounter: Payer: Self-pay | Admitting: *Deleted

## 2014-12-07 ENCOUNTER — Other Ambulatory Visit: Payer: Self-pay | Admitting: *Deleted

## 2014-12-07 MED ORDER — FOLIC ACID 1 MG PO TABS: 1.0000 mg | ORAL_TABLET | Freq: Every day | ORAL | Status: DC

## 2014-12-15 ENCOUNTER — Telehealth: Payer: Self-pay | Admitting: *Deleted

## 2014-12-15 MED ORDER — SUCRALFATE 1 G PO TABS: 1.0000 g | ORAL_TABLET | Freq: Two times a day (BID) | ORAL | Status: DC | PRN

## 2014-12-15 NOTE — Telephone Encounter (Signed)
Pt would like a Rx for Carafate 1gram.  (Not on med list) only uses them when she has a reflux flare up. Only needs about 20. Please advise.

## 2014-12-15 NOTE — Telephone Encounter (Signed)
rx sent in to her pharmacy.

## 2014-12-21 ENCOUNTER — Ambulatory Visit (INDEPENDENT_AMBULATORY_CARE_PROVIDER_SITE_OTHER): Payer: Medicare Other | Admitting: Internal Medicine

## 2014-12-21 ENCOUNTER — Encounter: Payer: Self-pay | Admitting: Internal Medicine

## 2014-12-21 VITALS — BP 130/80 | HR 61 | Temp 98.5°F | Ht 64.0 in | Wt 189.4 lb

## 2014-12-21 DIAGNOSIS — M4800 Spinal stenosis, site unspecified: Secondary | ICD-10-CM

## 2014-12-21 DIAGNOSIS — M179 Osteoarthritis of knee, unspecified: Secondary | ICD-10-CM

## 2014-12-21 DIAGNOSIS — Z860100 Personal history of colon polyps, unspecified: Secondary | ICD-10-CM

## 2014-12-21 DIAGNOSIS — I2510 Atherosclerotic heart disease of native coronary artery without angina pectoris: Secondary | ICD-10-CM

## 2014-12-21 DIAGNOSIS — E7800 Pure hypercholesterolemia, unspecified: Secondary | ICD-10-CM

## 2014-12-21 DIAGNOSIS — E559 Vitamin D deficiency, unspecified: Secondary | ICD-10-CM

## 2014-12-21 DIAGNOSIS — I10 Essential (primary) hypertension: Secondary | ICD-10-CM | POA: Diagnosis not present

## 2014-12-21 DIAGNOSIS — F329 Major depressive disorder, single episode, unspecified: Secondary | ICD-10-CM | POA: Diagnosis not present

## 2014-12-21 DIAGNOSIS — R1310 Dysphagia, unspecified: Secondary | ICD-10-CM | POA: Diagnosis not present

## 2014-12-21 DIAGNOSIS — K2100 Gastro-esophageal reflux disease with esophagitis, without bleeding: Secondary | ICD-10-CM

## 2014-12-21 DIAGNOSIS — M1710 Unilateral primary osteoarthritis, unspecified knee: Secondary | ICD-10-CM

## 2014-12-21 DIAGNOSIS — F32A Depression, unspecified: Secondary | ICD-10-CM

## 2014-12-21 NOTE — Progress Notes (Signed)
Pre visit review using our clinic review tool, if applicable. No additional management support is needed unless otherwise documented below in the visit note. 

## 2014-12-23 ENCOUNTER — Telehealth: Payer: Self-pay | Admitting: *Deleted

## 2014-12-23 NOTE — Telephone Encounter (Signed)
VM from [facility], States Quantity Limit Request for Pantoprazole was approved for one year, effective 12/21/14. Will fax letter for chart.

## 2014-12-26 ENCOUNTER — Encounter: Payer: Self-pay | Admitting: Internal Medicine

## 2014-12-26 NOTE — Assessment & Plan Note (Signed)
Desires no further intervention at this point.  Follow.

## 2014-12-26 NOTE — Assessment & Plan Note (Signed)
Continue vitamin D supplements.  Recheck vitamin D level with next labs.

## 2014-12-26 NOTE — Assessment & Plan Note (Signed)
See above.  Worsening over the last 6 months.  Refer to GI.

## 2014-12-26 NOTE — Assessment & Plan Note (Signed)
Low cholesterol diet and exercise.  Cholesterol improved with low dose pravastatin.  She desires not to titrate.  Follow lipid panel and liver function tests.  LDL 12/02/14 - 120.

## 2014-12-26 NOTE — Assessment & Plan Note (Signed)
Is s/p bilateral knee replacements and left hip replacement.  Continue exercises.  Follow.

## 2014-12-26 NOTE — Progress Notes (Signed)
Patient ID: Charlotte Wagner, female   DOB: April 16, 1936, 79 y.o.   MRN: [id number]   Subjective:    Patient ID: Charlotte Wagner, female    DOB: 11-13-1935, 79 y.o.   MRN: [id number]  HPI  Patient here for her physical exam.  Has been having problems over the last 6 months with dysphagia.  Food feels like it is getting stuck.  Worsening.  Will regurgitate food at times.  Can taste it in the am.  No abdomina pain or cramping.  Persistent back pain.  Desires no further intervention.  miralax helps her bowels stay regular.  Increased stress with some medical issues - with her children.  Discussed at length with her.  Feels she is handling things relatively well.  Breathing stable.     Past Medical History  Diagnosis Date  . Asthma   . Arthritis     s/p bilateral knees and left hip replacement  . Depression   . History of chicken pox   . Cancer     melanoma right arm  . GERD (gastroesophageal reflux disease)     h/o hiatal hernia  . Allergy   . Arrhythmia     rare PVC's  . Heart murmur   . CAD (coronary artery disease)     s/p CABG 12/1996  . Hypertension   . Hyperlipidemia   . Chronic kidney disease   . Hx of migraines     rare now  . Colon polyps     H/O  . Spinal stenosis   . Hx: UTI (urinary tract infection)   . Urine incontinence     H/O  . PUD (peptic ulcer disease)     remote history  . Lichen planus   . Vitamin D deficiency   . Raynaud's phenomenon   . PMR (polymyalgia rheumatica)     h/o     Current Outpatient Prescriptions on File Prior to Visit  Medication Sig Dispense Refill  . Acetaminophen (TYLENOL EXTRA STRENGTH PO) Take by mouth.    [name] amitriptyline (ELAVIL) 25 MG tablet Take 1-2 tablets q hs 60 tablet 2  . b complex vitamins tablet Take 1 tablet by mouth daily.    . bisoprolol (ZEBETA) 5 MG tablet Take 1/2 tablet q day 30 tablet 5  . Cholecalciferol (VITAMIN D3) 2000 UNITS TABS Take by mouth.    . clarithromycin (BIAXIN XL) 500 MG 24 hr tablet One tablet twice a  day 14 tablet 0  . Coenzyme Q10 (CO Q 10 PO) Take by mouth.    . ezetimibe (ZETIA) 10 MG tablet Take 1 tablet (10 mg total) by mouth daily. 30 tablet 5  . fluconazole (DIFLUCAN) 200 MG tablet Take 200 mg by mouth as needed.     . fluticasone (FLOVENT HFA) 110 MCG/ACT inhaler Inhale 2 puffs into the lungs 2 (two) times daily. 1 Inhaler 2  . folic acid (FOLVITE) 1 MG tablet Take 1 tablet (1 mg total) by mouth daily. 30 tablet 5  . hydrocortisone 2.5 % cream Apply 1 application topically 2 (two) times daily at 10 am and 4 pm. Apply to vulva    . ipratropium (ATROVENT HFA) 17 MCG/ACT inhaler Inhale 2 puffs into the lungs every 6 (six) hours as needed for wheezing. 1 Inhaler 2  . ketoconazole (NIZORAL) 2 % cream Apply 1 application topically daily. 30 g 0  . levalbuterol (XOPENEX HFA) 45 MCG/ACT inhaler Inhale 2 puffs into the lungs every 6 (six) hours as needed for wheezing.  1 Inhaler 2  . LYSINE PO Take by mouth.    . nabumetone (RELAFEN) 750 MG tablet Take 1 tablet (750 mg total) by mouth 2 (two) times daily. 60 tablet 2  . Omega-3 Fatty Acids (FISH OIL PO) Take 2,000 mg by mouth daily.    . pantoprazole (PROTONIX) 40 MG tablet take 1 tablet by mouth twice a day 60 tablet 5  . Polyethylene Glycol 3350 (MIRALAX PO) Take by mouth as needed.    . pravastatin (PRAVACHOL) 10 MG tablet Take 1 tablet (10 mg total) by mouth every Monday, Wednesday, and Friday. 12 tablet 3  . Prenatal Vit-Fe Fumarate-FA (M-VIT PO) Take by mouth daily.    . ramipril (ALTACE) 10 MG capsule Take 1 capsule (10 mg total) by mouth daily. 30 capsule 5  . spironolactone (ALDACTONE) 25 MG tablet Take 1 tablet (25 mg total) by mouth every morning. 30 tablet 5  . sucralfate (CARAFATE) 1 G tablet Take 1 tablet (1 g total) by mouth 2 (two) times daily as needed. 30 tablet 0  . traZODone (DESYREL) 150 MG tablet Take 1 & 2/3-2 tablets QHS (Patient taking differently: Take 225 mg by mouth at bedtime. ) 60 tablet 2  . verapamil (COVERA HS)  180 MG (CO) 24 hr tablet Take 1 tablet (180 mg total) by mouth 2 (two) times daily. 60 tablet 5  . vitamin B-12 (CYANOCOBALAMIN) 1000 MCG tablet Take 1,000 mcg by mouth daily.    . vitamin C (ASCORBIC ACID) 500 MG tablet Take 500 mg by mouth daily.     No current facility-administered medications on file prior to visit.    Review of Systems  Constitutional: Negative for appetite change and unexpected weight change.  HENT: Negative for congestion and sinus pressure.   Respiratory: Negative for cough, chest tightness and shortness of breath.   Cardiovascular: Negative for chest pain, palpitations and leg swelling.  Gastrointestinal: Negative for nausea, vomiting, abdominal pain and diarrhea.       Dysphagia as outlined.    Genitourinary: Negative for dysuria and difficulty urinating.  Musculoskeletal: Positive for back pain. Negative for joint swelling.  Neurological: Negative for dizziness, light-headedness and headaches.  Psychiatric/Behavioral: Negative for dysphoric mood and agitation.       Increased stress.         Objective:     Blood pressure recheck:  138/78  Physical Exam  Constitutional: She is oriented to person, place, and time. She appears well-developed and well-nourished.  HENT:  Nose: Nose normal.  Mouth/Throat: Oropharynx is clear and moist.  Eyes: Right eye exhibits no discharge. Left eye exhibits no discharge. No scleral icterus.  Neck: Neck supple. No thyromegaly present.  Cardiovascular: Normal rate and regular rhythm.   Pulmonary/Chest: Breath sounds normal. No accessory muscle usage. No tachypnea. No respiratory distress. She has no decreased breath sounds. She has no wheezes. She has no rhonchi. Right breast exhibits no inverted nipple, no mass, no nipple discharge and no tenderness (no axillary adenopathy). Left breast exhibits no inverted nipple, no mass, no nipple discharge and no tenderness (no axilarry adenopathy).  Abdominal: Soft. Bowel sounds are  normal. There is no tenderness.  Musculoskeletal: She exhibits no edema or tenderness.  Lymphadenopathy:    She has no cervical adenopathy.  Neurological: She is alert and oriented to person, place, and time.  Skin: Skin is warm. No rash noted.  Psychiatric: She has a normal mood and affect. Her behavior is normal.    BP 130/80 mmHg  Pulse 61  Temp(Src) 98.5 F (36.9 C) (Oral)  Ht 5\' 4"  (1.626 m)  Wt 189 lb 6 oz (85.9 kg)  BMI 32.49 kg/m2  SpO2 97% Wt Readings from Last 3 Encounters:  12/21/14 189 lb 6 oz (85.9 kg)  08/16/14 190 lb 8 oz (86.41 kg)  06/15/14 193 lb (87.544 kg)     Lab Results  Component Value Date   WBC 4.0 06/15/2014   HGB 13.3 06/15/2014   HCT 39.0 06/15/2014   PLT 224.0 06/15/2014   GLUCOSE 102* 12/02/2014   CHOL 193 12/02/2014   TRIG 114.0 12/02/2014   HDL 50.00 12/02/2014   LDLDIRECT 126.7 07/03/2013   LDLCALC 120* 12/02/2014   ALT 20 11/18/2014   AST 24 11/18/2014   NA 135 12/02/2014   K 4.6 12/02/2014   CL 101 12/02/2014   CREATININE 1.09 12/02/2014   BUN 24* 12/02/2014   CO2 28 12/02/2014   TSH 0.73 06/15/2014       Assessment & Plan:   Problem List Items Addressed This Visit    CAD (coronary artery disease)    S/p CABG 1998.  Doing well.  ECHO 11/28/10 - EF 55-60%.  See last note for details.  Mild pulmonary hypertension.  Follow.        Depression    Some increased stress with her children's medical issues.  Feels she is handling things relatively well.  Follow.       Dysphagia - Primary    See above.  Worsening over the last 6 months.  Refer to GI.       Relevant Orders   Ambulatory referral to Gastroenterology   Essential hypertension, benign    Blood pressure doing well.  Same medication regimen.  Follow.       Relevant Orders   Basic metabolic panel   GERD (gastroesophageal reflux disease)    Worsening symptoms.  States she has noticed tasting her supper - in the am.  Previous EGD - esophagitis.  UGI previously -  hiatal hernia.  Symptoms have worsened over the last 6 months.  Food hangs up.  Insurance not covering her protonix.  Other PPI's cause diarrhea.  Ranitidine did not help.  Took it bid.  Given the worsening symptoms, dysphagia, etc, will refer to GI for evaluation and treatment.  Get her back on protonix.        History of colonic polyps    Colonoscopy 08/10/10 - tubular adenoma.  Negative for high grade dysplasia.  Recommended f/u in five years.        Hypercholesterolemia    Low cholesterol diet and exercise.  Cholesterol improved with low dose pravastatin.  She desires not to titrate.  Follow lipid panel and liver function tests.  LDL 12/02/14 - 120.        Relevant Orders   Lipid panel   Hepatic function panel   Osteoarthritis    Is s/p bilateral knee replacements and left hip replacement.  Continue exercises.  Follow.       Spinal stenosis    Desires no further intervention at this point.  Follow.        Vitamin D deficiency    Continue vitamin D supplements.  Recheck vitamin D level with next labs.       Relevant Orders   Vit D  25 hydroxy (rtn osteoporosis monitoring)     I spent 25 minutes with the patient and more than 50% of the time was spent in consultation regarding the  above.     [name], MD

## 2014-12-26 NOTE — Assessment & Plan Note (Signed)
Some increased stress with her children's medical issues.  Feels she is handling things relatively well.  Follow.

## 2014-12-26 NOTE — Assessment & Plan Note (Signed)
Colonoscopy 08/10/10 - tubular adenoma.  Negative for high grade dysplasia.  Recommended f/u in five years.

## 2014-12-26 NOTE — Assessment & Plan Note (Signed)
Blood pressure doing well.  Same medication regimen.  Follow.    

## 2014-12-26 NOTE — Assessment & Plan Note (Signed)
S/p CABG 1998.  Doing well.  ECHO 11/28/10 - EF 55-60%.  See last note for details.  Mild pulmonary hypertension.  Follow.

## 2014-12-26 NOTE — Assessment & Plan Note (Signed)
Worsening symptoms.  States she has noticed tasting her supper - in the am.  Previous EGD - esophagitis.  UGI previously - hiatal hernia.  Symptoms have worsened over the last 6 months.  Food hangs up.  Insurance not covering her protonix.  Other PPI's cause diarrhea.  Ranitidine did not help.  Took it bid.  Given the worsening symptoms, dysphagia, etc, will refer to GI for evaluation and treatment.  Get her back on protonix.

## 2014-12-28 ENCOUNTER — Other Ambulatory Visit: Payer: Self-pay | Admitting: *Deleted

## 2014-12-28 MED ORDER — TRAZODONE HCL 150 MG PO TABS: ORAL_TABLET | ORAL | Status: DC

## 2014-12-28 MED ORDER — HYDROCORTISONE 2.5 % EX CREA: 1.0000 "application " | TOPICAL_CREAM | Freq: Two times a day (BID) | CUTANEOUS | Status: DC

## 2014-12-28 NOTE — Telephone Encounter (Signed)
Patient called requesting a refill on hydrocortisone cream. Please advise

## 2014-12-28 NOTE — Telephone Encounter (Signed)
Refill hydrocortisone cream x 1.

## 2014-12-29 NOTE — Telephone Encounter (Signed)
Approval form received & faxed to pharmacy also. Effective until 12/21/15

## 2014-12-30 ENCOUNTER — Ambulatory Visit (INDEPENDENT_AMBULATORY_CARE_PROVIDER_SITE_OTHER): Payer: Medicare Other | Admitting: Internal Medicine

## 2014-12-30 ENCOUNTER — Encounter: Payer: Self-pay | Admitting: Internal Medicine

## 2014-12-30 VITALS — BP 110/60 | HR 59 | Temp 97.9°F | Ht 64.0 in | Wt 186.4 lb

## 2014-12-30 DIAGNOSIS — E559 Vitamin D deficiency, unspecified: Secondary | ICD-10-CM

## 2014-12-30 DIAGNOSIS — R350 Frequency of micturition: Secondary | ICD-10-CM | POA: Diagnosis not present

## 2014-12-30 DIAGNOSIS — E7800 Pure hypercholesterolemia, unspecified: Secondary | ICD-10-CM

## 2014-12-30 DIAGNOSIS — I10 Essential (primary) hypertension: Secondary | ICD-10-CM | POA: Diagnosis not present

## 2014-12-30 DIAGNOSIS — R300 Dysuria: Secondary | ICD-10-CM

## 2014-12-30 DIAGNOSIS — I2510 Atherosclerotic heart disease of native coronary artery without angina pectoris: Secondary | ICD-10-CM

## 2014-12-30 DIAGNOSIS — R5383 Other fatigue: Secondary | ICD-10-CM

## 2014-12-30 DIAGNOSIS — B349 Viral infection, unspecified: Secondary | ICD-10-CM

## 2014-12-30 LAB — URINALYSIS, ROUTINE W REFLEX MICROSCOPIC
Bilirubin Urine: NEGATIVE
Hgb urine dipstick: NEGATIVE
Ketones, ur: NEGATIVE
Leukocytes, UA: NEGATIVE
NITRITE: NEGATIVE
PH: 6 (ref 5.0–8.0)
RBC / HPF: NONE SEEN (ref 0–?)
Specific Gravity, Urine: 1.015 (ref 1.000–1.030)
Total Protein, Urine: NEGATIVE
UROBILINOGEN UA: 0.2 (ref 0.0–1.0)
Urine Glucose: NEGATIVE

## 2014-12-30 LAB — HEPATIC FUNCTION PANEL
ALT: 18 U/L (ref 0–35)
AST: 20 U/L (ref 0–37)
Albumin: 4.2 g/dL (ref 3.5–5.2)
Alkaline Phosphatase: 55 U/L (ref 39–117)
Bilirubin, Direct: 0.1 mg/dL (ref 0.0–0.3)
Total Bilirubin: 0.5 mg/dL (ref 0.2–1.2)
Total Protein: 6.5 g/dL (ref 6.0–8.3)

## 2014-12-30 LAB — VITAMIN D 25 HYDROXY (VIT D DEFICIENCY, FRACTURES): VITD: 29.07 ng/mL — ABNORMAL LOW (ref 30.00–100.00)

## 2014-12-30 LAB — BASIC METABOLIC PANEL
BUN: 22 mg/dL (ref 6–23)
CALCIUM: 9.7 mg/dL (ref 8.4–10.5)
CHLORIDE: 100 meq/L (ref 96–112)
CO2: 30 mEq/L (ref 19–32)
Creatinine, Ser: 1.02 mg/dL (ref 0.40–1.20)
GFR: 55.89 mL/min — ABNORMAL LOW (ref >60.00)
GLUCOSE: 83 mg/dL (ref 70–99)
Potassium: 4.6 mEq/L (ref 3.5–5.1)
Sodium: 134 mEq/L — ABNORMAL LOW (ref 135–145)

## 2014-12-30 LAB — CBC WITH DIFFERENTIAL/PLATELET
BASOS ABS: 0 10*3/uL (ref 0.0–0.1)
Basophils Relative: 0.4 % (ref 0.0–3.0)
Eosinophils Absolute: 0.1 10*3/uL (ref 0.0–0.7)
Eosinophils Relative: 2.6 % (ref 0.0–5.0)
HEMATOCRIT: 36.9 % (ref 36.0–46.0)
Hemoglobin: 12.6 g/dL (ref 12.0–15.0)
LYMPHS ABS: 0.9 10*3/uL (ref 0.7–4.0)
Lymphocytes Relative: 18.6 % (ref 12.0–46.0)
MCHC: 34.2 g/dL (ref 30.0–36.0)
MCV: 98.5 fl (ref 78.0–100.0)
MONO ABS: 0.5 10*3/uL (ref 0.1–1.0)
MONOS PCT: 10.6 % (ref 3.0–12.0)
NEUTROS ABS: 3.3 10*3/uL (ref 1.4–7.7)
Neutrophils Relative %: 67.8 % (ref 43.0–77.0)
Platelets: 226 10*3/uL (ref 150.0–400.0)
RBC: 3.74 Mil/uL — AB (ref 3.87–5.11)
RDW: 13 % (ref 11.5–15.5)
WBC: 4.9 10*3/uL (ref 4.0–10.5)

## 2014-12-30 MED ORDER — CIPROFLOXACIN HCL 500 MG PO TABS: 500.0000 mg | ORAL_TABLET | Freq: Two times a day (BID) | ORAL | Status: DC

## 2014-12-30 NOTE — Progress Notes (Signed)
Pre visit review using our clinic review tool, if applicable. No additional management support is needed unless otherwise documented below in the visit note. 

## 2015-01-01 LAB — CULTURE, URINE COMPREHENSIVE
Colony Count: NO GROWTH
Organism ID, Bacteria: NO GROWTH

## 2015-01-04 ENCOUNTER — Other Ambulatory Visit: Payer: Self-pay

## 2015-01-04 ENCOUNTER — Encounter: Payer: Self-pay | Admitting: Internal Medicine

## 2015-01-04 DIAGNOSIS — R300 Dysuria: Secondary | ICD-10-CM | POA: Insufficient documentation

## 2015-01-04 DIAGNOSIS — B349 Viral infection, unspecified: Secondary | ICD-10-CM | POA: Insufficient documentation

## 2015-01-04 MED ORDER — BISOPROLOL FUMARATE 5 MG PO TABS: ORAL_TABLET | ORAL | Status: DC

## 2015-01-04 NOTE — Assessment & Plan Note (Signed)
On pravastatin.  Was concerned some of the aching may be related to pravastatin.  Unclear if contributing to the current symptoms.  Will stop temporarily.  Follow.

## 2015-01-04 NOTE — Assessment & Plan Note (Signed)
Follow vitamin D level.  

## 2015-01-04 NOTE — Assessment & Plan Note (Signed)
She reported some nocturia and post void cramping.  Took one macrodantin a few days ago.  Also took a cipro last night.  No urinary symptoms now.  Hold on abx.  Check urinalysis and culture.  Will give her a rx for cipro to hang onto - if symptoms return before culture returns.

## 2015-01-04 NOTE — Assessment & Plan Note (Signed)
Blood pressure doing well.  Follow.  Check metabolic panel.   

## 2015-01-04 NOTE — Progress Notes (Signed)
Patient ID: Charlotte Wagner, female   DOB: 30-May-1936, 79 y.o.   MRN: [id number]   Subjective:    Patient ID: Charlotte Wagner, female    DOB: July 05, 1936, 79 y.o.   MRN: [id number]  HPI  Patient here as a work in with concerns about "not feeling well".  Reports noticing (several days ago) - nocturia and post void cramping.  Took macrodantin x 1 a few nights ago.  Reports increased aching.  Some headache.  Not severe.  No increased cough or congestion.  No vomiting.  No nausea.  Decreased appetite.  Some myalgia.  No tick bite.  No rash.  Took cipro last night.  No urinary symptoms now.     Past Medical History  Diagnosis Date  . Asthma   . Arthritis     s/p bilateral knees and left hip replacement  . Depression   . History of chicken pox   . Cancer     melanoma right arm  . GERD (gastroesophageal reflux disease)     h/o hiatal hernia  . Allergy   . Arrhythmia     rare PVC's  . Heart murmur   . CAD (coronary artery disease)     s/p CABG 12/1996  . Hypertension   . Hyperlipidemia   . Chronic kidney disease   . Hx of migraines     rare now  . Colon polyps     H/O  . Spinal stenosis   . Hx: UTI (urinary tract infection)   . Urine incontinence     H/O  . PUD (peptic ulcer disease)     remote history  . Lichen planus   . Vitamin D deficiency   . Raynaud's phenomenon   . PMR (polymyalgia rheumatica)     h/o    Current Outpatient Prescriptions on File Prior to Visit  Medication Sig Dispense Refill  . amitriptyline (ELAVIL) 25 MG tablet Take 1-2 tablets q hs 60 tablet 2  . b complex vitamins tablet Take 1 tablet by mouth daily.    . bisoprolol (ZEBETA) 5 MG tablet Take 1/2 tablet q day 30 tablet 5  . Cholecalciferol (VITAMIN D3) 2000 UNITS TABS Take by mouth.    . Coenzyme Q10 (CO Q 10 PO) Take by mouth.    . ezetimibe (ZETIA) 10 MG tablet Take 1 tablet (10 mg total) by mouth daily. 30 tablet 5  . fluconazole (DIFLUCAN) 200 MG tablet Take 200 mg by mouth as needed.     .  fluticasone (FLOVENT HFA) 110 MCG/ACT inhaler Inhale 2 puffs into the lungs 2 (two) times daily. 1 Inhaler 2  . folic acid (FOLVITE) 1 MG tablet Take 1 tablet (1 mg total) by mouth daily. 30 tablet 5  . hydrocortisone 2.5 % cream Apply 1 application topically 2 (two) times daily at 10 am and 4 pm. Apply to vulva 30 g 0  . ipratropium (ATROVENT HFA) 17 MCG/ACT inhaler Inhale 2 puffs into the lungs every 6 (six) hours as needed for wheezing. 1 Inhaler 2  . ketoconazole (NIZORAL) 2 % cream Apply 1 application topically daily. 30 g 0  . levalbuterol (XOPENEX HFA) 45 MCG/ACT inhaler Inhale 2 puffs into the lungs every 6 (six) hours as needed for wheezing. 1 Inhaler 2  . LYSINE PO Take by mouth.    . nabumetone (RELAFEN) 750 MG tablet Take 1 tablet (750 mg total) by mouth 2 (two) times daily. 60 tablet 2  . Omega-3 Fatty Acids (FISH OIL PO) Take  2,000 mg by mouth daily.    . pantoprazole (PROTONIX) 40 MG tablet take 1 tablet by mouth twice a day 60 tablet 5  . Polyethylene Glycol 3350 (MIRALAX PO) Take by mouth as needed.    . pravastatin (PRAVACHOL) 10 MG tablet Take 1 tablet (10 mg total) by mouth every Monday, Wednesday, and Friday. 12 tablet 3  . Prenatal Vit-Fe Fumarate-FA (M-VIT PO) Take by mouth daily.    . ramipril (ALTACE) 10 MG capsule Take 1 capsule (10 mg total) by mouth daily. 30 capsule 5  . spironolactone (ALDACTONE) 25 MG tablet Take 1 tablet (25 mg total) by mouth every morning. 30 tablet 5  . sucralfate (CARAFATE) 1 G tablet Take 1 tablet (1 g total) by mouth 2 (two) times daily as needed. 30 tablet 0  . traZODone (DESYREL) 150 MG tablet Take 1 & 2/3-2 tablets QHS 60 tablet 2  . verapamil (COVERA HS) 180 MG (CO) 24 hr tablet Take 1 tablet (180 mg total) by mouth 2 (two) times daily. 60 tablet 5  . vitamin B-12 (CYANOCOBALAMIN) 1000 MCG tablet Take 1,000 mcg by mouth daily.    . vitamin C (ASCORBIC ACID) 500 MG tablet Take 500 mg by mouth daily.    . Acetaminophen (TYLENOL EXTRA  STRENGTH PO) Take by mouth.     No current facility-administered medications on file prior to visit.    Review of Systems  Constitutional: Positive for appetite change (decreased appetite). Negative for unexpected weight change.  HENT: Negative for congestion and sinus pressure.   Respiratory: Negative for cough, chest tightness and shortness of breath.   Cardiovascular: Negative for chest pain, palpitations and leg swelling.  Gastrointestinal: Negative for nausea, vomiting, abdominal pain and diarrhea.  Genitourinary:       Previous nocturia and post void cramping.  No symptoms now.  Took abx as outlined.    Musculoskeletal:       Aching and myalgias.    Skin: Negative for color change and rash.  Neurological: Positive for headaches. Negative for dizziness and light-headedness.  Hematological: Negative for adenopathy.       Objective:    Physical Exam  Constitutional: No distress.  HENT:  Nose: Nose normal.  Mouth/Throat: Oropharynx is clear and moist.  Neck: Neck supple. No thyromegaly present.  Cardiovascular: Normal rate and regular rhythm.   Pulmonary/Chest: Breath sounds normal. No respiratory distress. She has no wheezes.  Abdominal: Soft. Bowel sounds are normal. There is no tenderness.  Musculoskeletal: She exhibits no edema or tenderness.  Lymphadenopathy:    She has no cervical adenopathy.  Skin: No rash noted. No erythema.    BP 110/60 mmHg  Pulse 59  Temp(Src) 97.9 F (36.6 C) (Oral)  Ht 5\' 4"  (1.626 m)  Wt 186 lb 6 oz (84.539 kg)  BMI 31.98 kg/m2  SpO2 97% Wt Readings from Last 3 Encounters:  12/30/14 186 lb 6 oz (84.539 kg)  12/21/14 189 lb 6 oz (85.9 kg)  08/16/14 190 lb 8 oz (86.41 kg)     Lab Results  Component Value Date   WBC 4.9 12/30/2014   HGB 12.6 12/30/2014   HCT 36.9 12/30/2014   PLT 226.0 12/30/2014   GLUCOSE 83 12/30/2014   CHOL 193 12/02/2014   TRIG 114.0 12/02/2014   HDL 50.00 12/02/2014   LDLDIRECT 126.7 07/03/2013    LDLCALC 120* 12/02/2014   ALT 18 12/30/2014   AST 20 12/30/2014   NA 134* 12/30/2014   K 4.6 12/30/2014  CL 100 12/30/2014   CREATININE 1.02 12/30/2014   BUN 22 12/30/2014   CO2 30 12/30/2014   TSH 0.73 06/15/2014       Assessment & Plan:   Problem List Items Addressed This Visit    Dysuria    She reported some nocturia and post void cramping.  Took one macrodantin a few days ago.  Also took a cipro last night.  No urinary symptoms now.  Hold on abx.  Check urinalysis and culture.  Will give her a rx for cipro to hang onto - if symptoms return before culture returns.        Essential hypertension, benign    Blood pressure doing well.  Follow.  Check metabolic panel.       Hypercholesterolemia    On pravastatin.  Was concerned some of the aching may be related to pravastatin.  Unclear if contributing to the current symptoms.  Will stop temporarily.  Follow.        Viral syndrome    Symptoms may be c/w viral syndrome.  Rest.  Fluids.  Bland foods.  Check cbc.  Hold abx.  Follow.        Vitamin D deficiency    Follow vitamin D level.         Other Visit Diagnoses    Urinary frequency    -  Primary    Relevant Orders    Urinalysis, Routine w reflex microscopic (Completed)    CULTURE, URINE COMPREHENSIVE (Completed)    Other fatigue        Relevant Orders    CBC with Differential/Platelet      I spent 25 minutes with the patient and more than 50% of the time was spent in consultation regarding the above.     [name], MD

## 2015-01-04 NOTE — Assessment & Plan Note (Signed)
Symptoms may be c/w viral syndrome.  Rest.  Fluids.  Bland foods.  Check cbc.  Hold abx.  Follow.

## 2015-01-11 ENCOUNTER — Other Ambulatory Visit: Payer: Self-pay | Admitting: *Deleted

## 2015-01-11 NOTE — Telephone Encounter (Signed)
Ok refill? 

## 2015-01-12 MED ORDER — NABUMETONE 750 MG PO TABS: 750.0000 mg | ORAL_TABLET | Freq: Every day | ORAL | Status: DC

## 2015-01-12 NOTE — Telephone Encounter (Signed)
Ok to refill x 1.  Please notify pt that I would like for her to try to see if she can take the medication one time per day instead of 2x/day.  Can use tylenol with - if needed.

## 2015-01-12 NOTE — Telephone Encounter (Signed)
Pt notified and  verbalized understanding. States she will try taking once daily, is already taking tylenol. Will call back with worsening symptoms.

## 2015-01-13 ENCOUNTER — Ambulatory Visit (INDEPENDENT_AMBULATORY_CARE_PROVIDER_SITE_OTHER): Payer: Medicare Other

## 2015-01-13 DIAGNOSIS — Z23 Encounter for immunization: Secondary | ICD-10-CM | POA: Diagnosis not present

## 2015-01-13 MED ORDER — PNEUMOCOCCAL 13-VAL CONJ VACC IM SUSP: 0.5000 mL | INTRAMUSCULAR | Status: DC

## 2015-01-13 NOTE — Progress Notes (Signed)
Patient ID: Charlotte Wagner, female   DOB: [birth year], 79 y.o.   MRN: [id number] Patient received Prevnar 13 injection.  Left deltoid. Tolerated well.  No redness or swelling.  Patient given vaccine information statement.

## 2015-01-17 ENCOUNTER — Other Ambulatory Visit: Payer: Self-pay | Admitting: *Deleted

## 2015-01-17 MED ORDER — EZETIMIBE 10 MG PO TABS: 10.0000 mg | ORAL_TABLET | Freq: Every day | ORAL | Status: DC

## 2015-01-21 ENCOUNTER — Other Ambulatory Visit: Payer: Self-pay | Admitting: *Deleted

## 2015-01-21 MED ORDER — RAMIPRIL 10 MG PO CAPS: 10.0000 mg | ORAL_CAPSULE | Freq: Every day | ORAL | Status: DC

## 2015-01-26 ENCOUNTER — Encounter: Payer: Self-pay | Admitting: Nurse Practitioner

## 2015-01-26 ENCOUNTER — Ambulatory Visit (INDEPENDENT_AMBULATORY_CARE_PROVIDER_SITE_OTHER): Payer: Medicare Other | Admitting: Nurse Practitioner

## 2015-01-26 VITALS — BP 122/68 | HR 60 | Temp 97.7°F | Resp 14 | Ht 64.0 in | Wt 188.4 lb

## 2015-01-26 DIAGNOSIS — R300 Dysuria: Secondary | ICD-10-CM

## 2015-01-26 LAB — POCT URINALYSIS DIPSTICK
Bilirubin, UA: NEGATIVE
Blood, UA: NEGATIVE
Glucose, UA: NEGATIVE
Ketones, UA: NEGATIVE
NITRITE UA: NEGATIVE
PH UA: 5.5
Protein, UA: NEGATIVE
Spec Grav, UA: 1.02
Urobilinogen, UA: 0.2

## 2015-01-26 NOTE — Progress Notes (Signed)
   Subjective:    Patient ID: Charlotte Wagner, female    DOB: [birth year], 79 y.o.   MRN: [id number]  HPI  Pt left without being seen. Left urine specimen.  Told nurse symptoms: burning x 3 days. She reported to her that she is 60% sure she has a UTI and is leaving town for the weekend.  She left at 9:42 am and stated they really are counting on her at church today before she left.    Review of Systems     Objective:   Physical Exam  Constitutional:  BP 122/68 mmHg  Pulse 60  Temp(Src) 97.7 F (36.5 C) (Oral)  Resp 14  Ht 5\' 4"  (1.626 m)  Wt 188 lb 6.4 oz (85.458 kg)  BMI 32.32 kg/m2  SpO2 97%           Assessment & Plan:

## 2015-01-26 NOTE — Progress Notes (Signed)
Pre visit review using our clinic review tool, if applicable. No additional management support is needed unless otherwise documented below in the visit note. 

## 2015-01-27 ENCOUNTER — Other Ambulatory Visit: Payer: Self-pay | Admitting: *Deleted

## 2015-01-27 MED ORDER — SPIRONOLACTONE 25 MG PO TABS: 25.0000 mg | ORAL_TABLET | ORAL | Status: DC

## 2015-01-29 LAB — URINE CULTURE

## 2015-01-31 ENCOUNTER — Encounter: Payer: Self-pay | Admitting: Internal Medicine

## 2015-01-31 ENCOUNTER — Telehealth: Payer: Self-pay

## 2015-01-31 ENCOUNTER — Ambulatory Visit (INDEPENDENT_AMBULATORY_CARE_PROVIDER_SITE_OTHER): Payer: Medicare Other | Admitting: Internal Medicine

## 2015-01-31 VITALS — BP 116/64 | HR 60 | Temp 97.6°F | Resp 14 | Ht 64.0 in | Wt 189.2 lb

## 2015-01-31 DIAGNOSIS — N3000 Acute cystitis without hematuria: Secondary | ICD-10-CM | POA: Diagnosis not present

## 2015-01-31 DIAGNOSIS — I2510 Atherosclerotic heart disease of native coronary artery without angina pectoris: Secondary | ICD-10-CM

## 2015-01-31 MED ORDER — CEPHALEXIN 500 MG PO CAPS: 500.0000 mg | ORAL_CAPSULE | Freq: Two times a day (BID) | ORAL | Status: DC

## 2015-01-31 NOTE — Telephone Encounter (Signed)
Pt evaluated today in office.

## 2015-01-31 NOTE — Progress Notes (Signed)
Pre visit review using our clinic review tool, if applicable. No additional management support is needed unless otherwise documented below in the visit note. 

## 2015-01-31 NOTE — Telephone Encounter (Signed)
Pt called and said she cannot come in today, but possibly tomorrow morning.  She said she is feeling worse and would like to know if she can start Cayce today since the organism is resistant to Cipro.  Please advise.

## 2015-02-01 ENCOUNTER — Ambulatory Visit
Admit: 2015-02-01 | Disposition: A | Discharge status: Home / Self Care | Payer: Self-pay | Attending: Internal Medicine | Admitting: Internal Medicine

## 2015-02-01 DIAGNOSIS — Z1231 Encounter for screening mammogram for malignant neoplasm of breast: Secondary | ICD-10-CM | POA: Diagnosis not present

## 2015-02-03 ENCOUNTER — Other Ambulatory Visit: Payer: Self-pay | Admitting: *Deleted

## 2015-02-03 MED ORDER — AMITRIPTYLINE HCL 25 MG PO TABS: ORAL_TABLET | ORAL | Status: DC

## 2015-02-07 DIAGNOSIS — N390 Urinary tract infection, site not specified: Secondary | ICD-10-CM | POA: Insufficient documentation

## 2015-02-07 NOTE — Progress Notes (Signed)
Patient ID: Charlotte Wagner, female   DOB: January 28, 1936, 79 y.o.   MRN: [id number]   Subjective:    Patient ID: Charlotte Wagner, female    DOB: [birth year], 79 y.o.   MRN: [id number]  HPI  Patient here as a work in with concerns regarding urinary tract infection.  States had "vulvitis".  This is better.  Then reports nocturia and burning with urination.  Some LLQ pain.  No significant abdominal pain or cramping.  Eating and drinking.  No nausea or vomiting.  No diarrhea.  Previous viral infection resolved.     Past Medical History  Diagnosis Date  . Asthma   . Arthritis     s/p bilateral knees and left hip replacement  . Depression   . History of chicken pox   . Cancer     melanoma right arm  . GERD (gastroesophageal reflux disease)     h/o hiatal hernia  . Allergy   . Arrhythmia     rare PVC's  . Heart murmur   . CAD (coronary artery disease)     s/p CABG 12/1996  . Hypertension   . Hyperlipidemia   . Chronic kidney disease   . Hx of migraines     rare now  . Colon polyps     H/O  . Spinal stenosis   . Hx: UTI (urinary tract infection)   . Urine incontinence     H/O  . PUD (peptic ulcer disease)     remote history  . Lichen planus   . Vitamin D deficiency   . Raynaud's phenomenon   . PMR (polymyalgia rheumatica)     h/o    Review of Systems  Constitutional: Negative for appetite change and unexpected weight change.  Gastrointestinal: Negative for nausea, vomiting, abdominal pain (no signficant abdominal pain.  some LLQ pain.  not severe and not noticing now. ) and diarrhea.  Genitourinary:       Reports previous vulvitis.  Cleared.  Some nocturia.  Some burning with urination.  No vaginal discharge.    Neurological: Negative for dizziness and light-headedness.       Objective:    Physical Exam  Constitutional: No distress.  Neck: Neck supple.  Cardiovascular: Normal rate and regular rhythm.   Pulmonary/Chest: Breath sounds normal. No respiratory distress. She has  no wheezes.  Abdominal: Soft. Bowel sounds are normal. There is no tenderness.  Musculoskeletal:  No CVA tenderness.   Lymphadenopathy:    She has no cervical adenopathy.    BP 116/64 mmHg  Pulse 60  Temp(Src) 97.6 F (36.4 C) (Oral)  Resp 14  Ht 5\' 4"  (1.626 m)  Wt 189 lb 3.2 oz (85.821 kg)  BMI 32.46 kg/m2  SpO2 97% Wt Readings from Last 3 Encounters:  01/31/15 189 lb 3.2 oz (85.821 kg)  01/26/15 188 lb 6.4 oz (85.458 kg)  12/30/14 186 lb 6 oz (84.539 kg)     Lab Results  Component Value Date   WBC 4.9 12/30/2014   HGB 12.6 12/30/2014   HCT 36.9 12/30/2014   PLT 226.0 12/30/2014   GLUCOSE 83 12/30/2014   CHOL 193 12/02/2014   TRIG 114.0 12/02/2014   HDL 50.00 12/02/2014   LDLDIRECT 126.7 07/03/2013   LDLCALC 120* 12/02/2014   ALT 18 12/30/2014   AST 20 12/30/2014   NA 134* 12/30/2014   K 4.6 12/30/2014   CL 100 12/30/2014   CREATININE 1.02 12/30/2014   BUN 22 12/30/2014   CO2 30 12/30/2014  TSH 0.73 06/15/2014       Assessment & Plan:   Problem List Items Addressed This Visit    UTI (urinary tract infection) - Primary    Symptoms as outlined.  Culture reveals infection.  Bacteria resistant to cipro.  She is allergic to sulfa and pcn.  Treat with keflex as directed.  Follow.  Stay hydrated.        Relevant Medications   cephALEXin (KEFLEX) 500 MG capsule       [name], MD

## 2015-02-07 NOTE — Assessment & Plan Note (Signed)
Symptoms as outlined.  Culture reveals infection.  Bacteria resistant to cipro.  She is allergic to sulfa and pcn.  Treat with keflex as directed.  Follow.  Stay hydrated.

## 2015-02-11 ENCOUNTER — Other Ambulatory Visit: Payer: Self-pay | Admitting: *Deleted

## 2015-02-11 MED ORDER — NABUMETONE 750 MG PO TABS: 750.0000 mg | ORAL_TABLET | Freq: Every day | ORAL | Status: DC

## 2015-02-17 ENCOUNTER — Other Ambulatory Visit: Payer: Self-pay | Admitting: Gastroenterology

## 2015-02-17 DIAGNOSIS — R1310 Dysphagia, unspecified: Secondary | ICD-10-CM | POA: Diagnosis not present

## 2015-02-17 DIAGNOSIS — R1013 Epigastric pain: Secondary | ICD-10-CM | POA: Diagnosis not present

## 2015-02-17 DIAGNOSIS — Z1211 Encounter for screening for malignant neoplasm of colon: Secondary | ICD-10-CM | POA: Diagnosis not present

## 2015-02-22 DIAGNOSIS — Z1211 Encounter for screening for malignant neoplasm of colon: Secondary | ICD-10-CM | POA: Diagnosis not present

## 2015-02-22 DIAGNOSIS — Z1212 Encounter for screening for malignant neoplasm of rectum: Secondary | ICD-10-CM | POA: Diagnosis not present

## 2015-02-24 ENCOUNTER — Ambulatory Visit
Admission: RE | Admit: 2015-02-24 | Discharge: 2015-02-24 | Disposition: A | Discharge status: Home / Self Care | Payer: Medicare Other | Source: Ambulatory Visit | Attending: Gastroenterology | Admitting: Gastroenterology

## 2015-02-24 DIAGNOSIS — R1310 Dysphagia, unspecified: Secondary | ICD-10-CM | POA: Diagnosis not present

## 2015-02-24 DIAGNOSIS — K449 Diaphragmatic hernia without obstruction or gangrene: Secondary | ICD-10-CM | POA: Diagnosis not present

## 2015-02-24 DIAGNOSIS — K219 Gastro-esophageal reflux disease without esophagitis: Secondary | ICD-10-CM | POA: Insufficient documentation

## 2015-02-24 DIAGNOSIS — J392 Other diseases of pharynx: Secondary | ICD-10-CM | POA: Diagnosis not present

## 2015-03-02 ENCOUNTER — Other Ambulatory Visit: Payer: Self-pay | Admitting: *Deleted

## 2015-03-02 MED ORDER — PANTOPRAZOLE SODIUM 40 MG PO TBEC: DELAYED_RELEASE_TABLET | ORAL | Status: DC

## 2015-03-02 MED ORDER — HYDROCORTISONE 2.5 % EX CREA: 1.0000 "application " | TOPICAL_CREAM | Freq: Two times a day (BID) | CUTANEOUS | Status: DC

## 2015-03-02 NOTE — Telephone Encounter (Signed)
Refilled hydrocortisone cream x 2

## 2015-03-02 NOTE — Telephone Encounter (Signed)
Okay to refill? Please advise.  

## 2015-03-15 ENCOUNTER — Other Ambulatory Visit: Payer: Self-pay | Admitting: *Deleted

## 2015-03-15 ENCOUNTER — Telehealth: Payer: Self-pay | Admitting: *Deleted

## 2015-03-15 MED ORDER — IPRATROPIUM BROMIDE HFA 17 MCG/ACT IN AERS: 2.0000 | INHALATION_SPRAY | Freq: Four times a day (QID) | RESPIRATORY_TRACT | Status: DC | PRN

## 2015-03-15 MED ORDER — FLUTICASONE-SALMETEROL 250-50 MCG/DOSE IN AEPB: 1.0000 | INHALATION_SPRAY | Freq: Two times a day (BID) | RESPIRATORY_TRACT | Status: DC

## 2015-03-15 MED ORDER — VERAPAMIL HCL 180 MG (CO) PO TB24: 180.0000 mg | ORAL_TABLET | Freq: Two times a day (BID) | ORAL | Status: DC

## 2015-03-15 NOTE — Telephone Encounter (Signed)
Sent in rx for advair.  Let us know if symptoms worsen.

## 2015-03-15 NOTE — Telephone Encounter (Signed)
Pt called stating that she has h/o recurrent bronchitis & currently has a cold. Pt is requesting a refill on Atrovent & also requesting refill on Advair 250/50 (not on med list). Pt states that if she feels that she gets to the point of needing an abx, she will call for an appointment. Please advise or send in Advair 250/50 if okay to refill.

## 2015-03-21 ENCOUNTER — Other Ambulatory Visit (INDEPENDENT_AMBULATORY_CARE_PROVIDER_SITE_OTHER): Payer: Medicare Other

## 2015-03-21 ENCOUNTER — Encounter: Payer: Self-pay | Admitting: *Deleted

## 2015-03-21 DIAGNOSIS — E7800 Pure hypercholesterolemia, unspecified: Secondary | ICD-10-CM

## 2015-03-21 LAB — LIPID PANEL
CHOLESTEROL: 208 mg/dL — AB (ref 0–200)
HDL: 49.9 mg/dL (ref >39.00)
LDL Cholesterol: 140 mg/dL — ABNORMAL HIGH (ref 0–99)
NonHDL: 158.1
Total CHOL/HDL Ratio: 4
Triglycerides: 92 mg/dL (ref 0.0–149.0)
VLDL: 18.4 mg/dL (ref 0.0–40.0)

## 2015-03-22 ENCOUNTER — Other Ambulatory Visit (INDEPENDENT_AMBULATORY_CARE_PROVIDER_SITE_OTHER): Payer: Medicare Other

## 2015-03-22 ENCOUNTER — Encounter: Payer: Self-pay | Admitting: *Deleted

## 2015-03-22 ENCOUNTER — Other Ambulatory Visit: Payer: Self-pay | Admitting: Internal Medicine

## 2015-03-22 ENCOUNTER — Ambulatory Visit
Admission: RE | Admit: 2015-03-22 | Discharge: 2015-03-22 | Disposition: A | Discharge status: Home / Self Care | Payer: Medicare Other | Source: Ambulatory Visit | Attending: Gastroenterology | Admitting: Gastroenterology

## 2015-03-22 ENCOUNTER — Ambulatory Visit: Payer: Medicare Other | Admitting: Certified Registered Nurse Anesthetist

## 2015-03-22 ENCOUNTER — Encounter
Admission: RE | Disposition: A | Discharge status: Home / Self Care | Payer: Self-pay | Source: Ambulatory Visit | Attending: Gastroenterology

## 2015-03-22 DIAGNOSIS — K210 Gastro-esophageal reflux disease with esophagitis: Secondary | ICD-10-CM | POA: Diagnosis not present

## 2015-03-22 DIAGNOSIS — F329 Major depressive disorder, single episode, unspecified: Secondary | ICD-10-CM | POA: Diagnosis not present

## 2015-03-22 DIAGNOSIS — I10 Essential (primary) hypertension: Secondary | ICD-10-CM

## 2015-03-22 DIAGNOSIS — G43909 Migraine, unspecified, not intractable, without status migrainosus: Secondary | ICD-10-CM | POA: Insufficient documentation

## 2015-03-22 DIAGNOSIS — Z8711 Personal history of peptic ulcer disease: Secondary | ICD-10-CM | POA: Diagnosis not present

## 2015-03-22 DIAGNOSIS — E559 Vitamin D deficiency, unspecified: Secondary | ICD-10-CM | POA: Insufficient documentation

## 2015-03-22 DIAGNOSIS — Z880 Allergy status to penicillin: Secondary | ICD-10-CM | POA: Diagnosis not present

## 2015-03-22 DIAGNOSIS — K449 Diaphragmatic hernia without obstruction or gangrene: Secondary | ICD-10-CM | POA: Insufficient documentation

## 2015-03-22 DIAGNOSIS — Z951 Presence of aortocoronary bypass graft: Secondary | ICD-10-CM | POA: Diagnosis not present

## 2015-03-22 DIAGNOSIS — J45909 Unspecified asthma, uncomplicated: Secondary | ICD-10-CM | POA: Diagnosis not present

## 2015-03-22 DIAGNOSIS — Z882 Allergy status to sulfonamides status: Secondary | ICD-10-CM | POA: Insufficient documentation

## 2015-03-22 DIAGNOSIS — R1310 Dysphagia, unspecified: Secondary | ICD-10-CM | POA: Diagnosis not present

## 2015-03-22 DIAGNOSIS — I7300 Raynaud's syndrome without gangrene: Secondary | ICD-10-CM | POA: Diagnosis not present

## 2015-03-22 DIAGNOSIS — K219 Gastro-esophageal reflux disease without esophagitis: Secondary | ICD-10-CM | POA: Insufficient documentation

## 2015-03-22 DIAGNOSIS — Z888 Allergy status to other drugs, medicaments and biological substances status: Secondary | ICD-10-CM | POA: Insufficient documentation

## 2015-03-22 DIAGNOSIS — K2950 Unspecified chronic gastritis without bleeding: Secondary | ICD-10-CM | POA: Insufficient documentation

## 2015-03-22 DIAGNOSIS — E7800 Pure hypercholesterolemia, unspecified: Secondary | ICD-10-CM

## 2015-03-22 DIAGNOSIS — Z79899 Other long term (current) drug therapy: Secondary | ICD-10-CM | POA: Insufficient documentation

## 2015-03-22 DIAGNOSIS — K319 Disease of stomach and duodenum, unspecified: Secondary | ICD-10-CM | POA: Diagnosis not present

## 2015-03-22 DIAGNOSIS — K2970 Gastritis, unspecified, without bleeding: Secondary | ICD-10-CM | POA: Diagnosis not present

## 2015-03-22 DIAGNOSIS — I2510 Atherosclerotic heart disease of native coronary artery without angina pectoris: Secondary | ICD-10-CM | POA: Insufficient documentation

## 2015-03-22 DIAGNOSIS — Z87891 Personal history of nicotine dependence: Secondary | ICD-10-CM | POA: Insufficient documentation

## 2015-03-22 DIAGNOSIS — M1990 Unspecified osteoarthritis, unspecified site: Secondary | ICD-10-CM | POA: Diagnosis not present

## 2015-03-22 DIAGNOSIS — K2960 Other gastritis without bleeding: Secondary | ICD-10-CM | POA: Diagnosis not present

## 2015-03-22 DIAGNOSIS — E785 Hyperlipidemia, unspecified: Secondary | ICD-10-CM | POA: Diagnosis not present

## 2015-03-22 DIAGNOSIS — M353 Polymyalgia rheumatica: Secondary | ICD-10-CM | POA: Insufficient documentation

## 2015-03-22 DIAGNOSIS — I129 Hypertensive chronic kidney disease with stage 1 through stage 4 chronic kidney disease, or unspecified chronic kidney disease: Secondary | ICD-10-CM | POA: Insufficient documentation

## 2015-03-22 DIAGNOSIS — K3189 Other diseases of stomach and duodenum: Secondary | ICD-10-CM | POA: Diagnosis not present

## 2015-03-22 DIAGNOSIS — Z85820 Personal history of malignant melanoma of skin: Secondary | ICD-10-CM | POA: Diagnosis not present

## 2015-03-22 DIAGNOSIS — K228 Other specified diseases of esophagus: Secondary | ICD-10-CM | POA: Diagnosis not present

## 2015-03-22 HISTORY — DX: Malignant melanoma of skin, unspecified: C43.9

## 2015-03-22 HISTORY — PX: ESOPHAGOGASTRODUODENOSCOPY: SHX5428

## 2015-03-22 LAB — BASIC METABOLIC PANEL
BUN: 25 mg/dL — ABNORMAL HIGH (ref 6–23)
CO2: 27 mEq/L (ref 19–32)
CREATININE: 0.89 mg/dL (ref 0.40–1.20)
Calcium: 9.8 mg/dL (ref 8.4–10.5)
Chloride: 100 mEq/L (ref 96–112)
GFR: 65.38 mL/min (ref >60.00)
Glucose, Bld: 93 mg/dL (ref 70–99)
Potassium: 4.6 mEq/L (ref 3.5–5.1)
Sodium: 134 mEq/L — ABNORMAL LOW (ref 135–145)

## 2015-03-22 SURGERY — EGD (ESOPHAGOGASTRODUODENOSCOPY)
Anesthesia: General

## 2015-03-22 MED ORDER — SODIUM CHLORIDE 0.9 % IV SOLN
INTRAVENOUS | Status: DC
  Administered 2015-03-22 (×2): via INTRAVENOUS

## 2015-03-22 MED ORDER — LIDOCAINE HCL (CARDIAC) 20 MG/ML IV SOLN
INTRAVENOUS | Status: DC | PRN
  Administered 2015-03-22: 50 mg via INTRAVENOUS

## 2015-03-22 MED ORDER — PROPOFOL 10 MG/ML IV BOLUS
INTRAVENOUS | Status: DC | PRN
  Administered 2015-03-22: 50 mg via INTRAVENOUS

## 2015-03-22 MED ORDER — GLYCOPYRROLATE 0.2 MG/ML IJ SOLN
INTRAMUSCULAR | Status: DC | PRN
  Administered 2015-03-22: 0.2 mg via INTRAVENOUS

## 2015-03-22 MED ORDER — PROPOFOL INFUSION 10 MG/ML OPTIME
INTRAVENOUS | Status: DC | PRN
  Administered 2015-03-22: 50 ug/kg/min via INTRAVENOUS

## 2015-03-22 NOTE — Anesthesia Postprocedure Evaluation (Signed)
  Anesthesia Post-op Note  Patient: Charlotte Wagner  Procedure(s) Performed: Procedure(s): ESOPHAGOGASTRODUODENOSCOPY (EGD) (N/A)  Anesthesia type:General  Patient location: PACU  Post pain: Pain level controlled  Post assessment: Post-op Vital signs reviewed, Patient's Cardiovascular Status Stable, Respiratory Function Stable, Patent Airway and No signs of Nausea or vomiting  Post vital signs: Reviewed and stable  Last Vitals:  Filed Vitals:   03/22/15 1410  BP: 146/66  Pulse: 60  Temp:   Resp: 15    Level of consciousness: awake, alert  and patient cooperative  Complications: No apparent anesthesia complications

## 2015-03-22 NOTE — Transfer of Care (Signed)
Immediate Anesthesia Transfer of Care Note  Patient: Charlotte Wagner  Procedure(s) Performed: Procedure(s): ESOPHAGOGASTRODUODENOSCOPY (EGD) (N/A)  Patient Location: PACU  Anesthesia Type:General  Level of Consciousness: Alert, Awake, Oriented  Airway & Oxygen Therapy: Patient Spontanous Breathing  Post-op Assessment: Report given to RN  Post vital signs: Reviewed and stable  Last Vitals:  Filed Vitals:   03/22/15 1342  BP: 116/52  Pulse: 62  Temp: 36.3 C  Resp: 17    Complications: No apparent anesthesia complications

## 2015-03-22 NOTE — H&P (Signed)
Outpatient short stay form Pre-procedure 03/22/2015 12:55 PM Charlotte Sails MD  Primary Physician:   Dr. [name]  Reason for visit:  Dysphagia, GERD  History of present illness:  Patient is a 79 year old female who is presenting with increasing symptoms of reflux and GERD. Had a long history of having the symptoms dating back to her teenage years. He certainly been taking some increased dosing of her nabumetone. He is not regurgitating foods. Main difficulty is not so much foods but rather certain of her pills. Particularly this would be the fish oil pill. She has had a swallow couple weeks ago. This showed some prominence to the cricopharyngeus is a small hiatal hernia flux. The barium tablet passed without difficulty. Reviewed the film. There there is a prominent aortic arch does have a partial extrinsic compressive effect in the upper retrosternal area.    Current facility-administered medications:  .  0.9 %  sodium chloride infusion, , Intravenous, Continuous, Charlotte Sails, MD, Last Rate: 20 mL/hr at 03/22/15 1255  Prescriptions prior to admission  Medication Sig Dispense Refill Last Dose  . acetaminophen (TYLENOL) 500 MG tablet Take 1,000 mg by mouth every 6 (six) hours as needed for moderate pain or headache.     [name] amitriptyline (ELAVIL) 25 MG tablet Take 1-2 tablets q hs (Patient taking differently: Take 50 mg by mouth at bedtime. Take 1-2 tablets q hs) 60 tablet 2   . b complex vitamins tablet Take 1 tablet by mouth daily.   Taking  . bisoprolol (ZEBETA) 5 MG tablet Take 1/2 tablet q day (Patient taking differently: Take 2.5 mg by mouth daily. Take 1/2 tablet q day) 30 tablet 3 03/21/2015 at Unknown time  . Cholecalciferol (VITAMIN D3) 2000 UNITS TABS Take 1 capsule by mouth daily.    Taking  . Coenzyme Q10 (COQ-10) 100 MG CAPS Take 1 tablet by mouth daily.     [name] ezetimibe (ZETIA) 10 MG tablet Take 1 tablet (10 mg total) by mouth daily. 30 tablet 5 Taking  . fluticasone  (FLOVENT HFA) 110 MCG/ACT inhaler Inhale 2 puffs into the lungs 2 (two) times daily. (Patient taking differently: Inhale 2 puffs into the lungs 2 (two) times daily as needed (SOB). ) 1 Inhaler 2 Taking  . folic acid (FOLVITE) 1 MG tablet Take 1 tablet (1 mg total) by mouth daily. 30 tablet 5 Taking  . hydrocortisone 2.5 % cream Apply 1 application topically 2 (two) times daily at 10 am and 4 pm. Apply to vulva 30 g 1   . ketoconazole (NIZORAL) 2 % cream Apply 1 application topically daily. 30 g 0 Taking  . levalbuterol (XOPENEX HFA) 45 MCG/ACT inhaler Inhale 2 puffs into the lungs every 6 (six) hours as needed for wheezing. 1 Inhaler 2 Taking  . Multiple Vitamins-Minerals (HAIR/SKIN/NAILS PO) Take 1 tablet by mouth daily.     . nabumetone (RELAFEN) 750 MG tablet Take 1 tablet (750 mg total) by mouth daily. 30 tablet 2   . Omega-3 Fatty Acids (FISH OIL PO) Take 2,000 mg by mouth daily.   Taking  . pantoprazole (PROTONIX) 40 MG tablet take 1 tablet by mouth twice a day 60 tablet 5   . Polyethylene Glycol 3350 (MIRALAX PO) Take 8.5 g by mouth at bedtime.    Taking  . ramipril (ALTACE) 10 MG capsule Take 1 capsule (10 mg total) by mouth daily. 30 capsule 5 Taking  . spironolactone (ALDACTONE) 25 MG tablet Take 1 tablet (25 mg total)  by mouth every morning. 30 tablet 5 Taking  . sucralfate (CARAFATE) 1 G tablet Take 1 tablet (1 g total) by mouth 2 (two) times daily as needed. 30 tablet 0 Taking  . traZODone (DESYREL) 150 MG tablet Take 1 & 2/3-2 tablets QHS (Patient taking differently: Take 225 mg by mouth at bedtime. ) 60 tablet 2 Taking  . vitamin B-12 (CYANOCOBALAMIN) 1000 MCG tablet Take 1,000 mcg by mouth daily.   Taking  . vitamin C (ASCORBIC ACID) 500 MG tablet Take 500 mg by mouth daily.   Taking  . cephALEXin (KEFLEX) 500 MG capsule Take 1 capsule (500 mg total) by mouth 2 (two) times daily. 14 capsule 0   . Fluticasone-Salmeterol (ADVAIR DISKUS) 250-50 MCG/DOSE AEPB Inhale 1 puff into the lungs  2 (two) times daily. 60 each 1   . ipratropium (ATROVENT HFA) 17 MCG/ACT inhaler Inhale 2 puffs into the lungs every 6 (six) hours as needed for wheezing. 1 Inhaler 2   . pravastatin (PRAVACHOL) 10 MG tablet Take 1 tablet (10 mg total) by mouth every Monday, Wednesday, and Friday. 12 tablet 3 Taking  . verapamil (COVERA HS) 180 MG (CO) 24 hr tablet Take 1 tablet (180 mg total) by mouth 2 (two) times daily. 60 tablet 5      Allergies  Allergen Reactions  . Aspirin Other (See Comments)    Heartburn   . Penicillins Hives  . Statins Other (See Comments)    Muscle weakness  . Sulfa Antibiotics   . Tetracyclines & Related   . Tetracycline Rash     Past Medical History  Diagnosis Date  . Asthma   . Arthritis     s/p bilateral knees and left hip replacement  . Depression   . History of chicken pox   . Cancer     melanoma right arm  . GERD (gastroesophageal reflux disease)     h/o hiatal hernia  . Allergy   . Arrhythmia     rare PVC's  . Heart murmur   . CAD (coronary artery disease)     s/p CABG 12/1996  . Hypertension   . Hyperlipidemia   . Chronic kidney disease   . Hx of migraines     rare now  . Colon polyps     H/O  . Spinal stenosis   . Hx: UTI (urinary tract infection)   . Urine incontinence     H/O  . PUD (peptic ulcer disease)     remote history  . Lichen planus   . Vitamin D deficiency   . Raynaud's phenomenon   . PMR (polymyalgia rheumatica)     h/o  . Melanoma     Review of systems:      Physical Exam    Heart and lungs: Regular rate and rhythm without rub or gallop lungs are bilaterally clear    HEENT: Normocephalic atraumatic eyes are anicteric    Other:     Pertinant exam for procedure: Soft nontender nondistended bowel sounds positive normoactive    Planned proceedures: EGD and possible dilatation. I have discussed the risks benefits and complications of procedures to include not limited to bleeding, infection, perforation and the risk  of sedation and the patient wishes to proceed.    Charlotte Sails, MD Gastroenterology 03/22/2015  12:55 PM

## 2015-03-22 NOTE — Anesthesia Preprocedure Evaluation (Signed)
Anesthesia Evaluation  Patient identified by MRN, date of birth, ID band Patient awake    Reviewed: Allergy & Precautions, NPO status , Patient's Chart, lab work & pertinent test results  History of Anesthesia Complications Negative for: history of anesthetic complications  Airway Mallampati: II  TM Distance: >3 FB Neck ROM: Full    Dental no notable dental hx.    Pulmonary asthma , former smoker,  breath sounds clear to auscultation  Pulmonary exam normal       Cardiovascular hypertension, Pt. on medications - angina+ CAD Normal cardiovascular examRhythm:Regular Rate:Normal  S/p CABG 1998   Neuro/Psych  Headaches, PSYCHIATRIC DISORDERS Depression    GI/Hepatic Neg liver ROS, hiatal hernia, PUD, GERD-  Medicated and Poorly Controlled,  Endo/Other  negative endocrine ROS  Renal/GU negative Renal ROS  negative genitourinary   Musculoskeletal  (+) Arthritis -, Osteoarthritis,  Polymyalgia rheumatica   Abdominal   Peds negative pediatric ROS (+)  Hematology negative hematology ROS (+)   Anesthesia Other Findings   Reproductive/Obstetrics negative OB ROS                             Anesthesia Physical Anesthesia Plan  ASA: III  Anesthesia Plan: General   Post-op Pain Management:    Induction: Intravenous  Airway Management Planned: Nasal Cannula  Additional Equipment:   Intra-op Plan:   Post-operative Plan:   Informed Consent: I have reviewed the patients History and Physical, chart, labs and discussed the procedure including the risks, benefits and alternatives for the proposed anesthesia with the patient or authorized representative who has indicated his/her understanding and acceptance.   Dental advisory given  Plan Discussed with: CRNA and Surgeon  Anesthesia Plan Comments:         Anesthesia Quick Evaluation

## 2015-03-22 NOTE — Progress Notes (Signed)
Order placed for met b and liver panel.

## 2015-03-22 NOTE — Op Note (Signed)
[facility] Gastroenterology Patient Name: Charlotte Wagner Procedure Date: 03/22/2015 12:45 PM MRN: [id number] Account #: [id number] Date of Birth: 14-Dec-1935 Admit Type: Outpatient Age: 79 Room: [facility] ENDO ROOM 3 Gender: Female Note Status: Finalized Procedure:         Upper GI endoscopy Indications:       Dysphagia, Gastro-esophageal reflux disease Providers:         Lollie Sails, MD Referring MD:      [name], MD (Referring MD) Medicines:         Monitored Anesthesia Care Complications:     No immediate complications. Procedure:         Pre-Anesthesia Assessment:                    - ASA Grade Assessment: III - A patient with severe                     systemic disease.                    After obtaining informed consent, the endoscope was passed                     under direct vision. Throughout the procedure, the                     patient's blood pressure, pulse, and oxygen saturations                     were monitored continuously. The Endoscope was introduced                     through the mouth, and advanced to the third part of                     duodenum. The upper GI endoscopy was accomplished without                     difficulty. The patient tolerated the procedure well. Findings:      The lower third of the esophagus was significantly tortuous. Prominant       aortic arch noted.      Abnormal motility was noted in the lower third of the esophagus. The       cricopharyngeus was normal. There is spasticity of the esophageal body.       Tertiary peristaltic waves are noted.      A small hiatus hernia was found. The Z-line was a variable distance from       incisors; the hiatal hernia was sliding.      The Z-line was regular. Biopsies were taken with a cold forceps for       histology.      Patchy mild inflammation characterized by erythema was found in the       gastric body. Biopsies were taken with a cold forceps for histology.       submucosal lesion noted, positive pillow sign, biopsied, likely lipoma      Segmental mild mucosal variance characterized by texture change was       found in the prepyloric region of the stomach. Biopsies were taken with       a cold forceps for histology.      The examined duodenum was normal.      The cardia and gastric fundus were normal on retroflexion otherwise.  GERD findings were visualized including arytenoid erythema. Impression:        - Tortuous esophagus.                    - The examination was suspicious for presbyesophagus.                    - Small hiatus hernia.                    - Z-line regular. Biopsied.                    - Bile gastritis. Biopsied.                    - Gastric mucosal variant. Biopsied.                    - Normal examined duodenum. Recommendation:    - Await pathology results.                    - Use Protonix (pantoprazole) 40 mg PO BID.                    - Use sucralfate suspension 1 gram PO QID for 1 month. Procedure Code(s): --- Professional ---                    [phone], Esophagogastroduodenoscopy, flexible, transoral;                     with biopsy, single or multiple Diagnosis Code(s): --- Professional ---                    750.4, Other specified anomalies of esophagus                    535.40, Other specified gastritis, without mention of                     hemorrhage                    537.9, Unspecified disorder of stomach and duodenum                    787.20, Dysphagia, unspecified                    530.81, Esophageal reflux                    553.3, Diaphragmatic hernia without mention of obstruction                     or gangrene CPT copyright 2014 [employer]. All rights reserved. The codes documented in this report are preliminary and upon coder review may  be revised to meet current compliance requirements. Lollie Sails, MD 03/22/2015 1:48:12 PM This report has been signed  electronically. Number of Addenda: 0 Note Initiated On: 03/22/2015 12:45 PM      [facility]

## 2015-03-23 ENCOUNTER — Encounter: Payer: Self-pay | Admitting: *Deleted

## 2015-03-23 ENCOUNTER — Other Ambulatory Visit (INDEPENDENT_AMBULATORY_CARE_PROVIDER_SITE_OTHER): Payer: Medicare Other

## 2015-03-23 ENCOUNTER — Encounter: Payer: Self-pay | Admitting: Gastroenterology

## 2015-03-23 ENCOUNTER — Ambulatory Visit: Payer: Medicare Other | Admitting: Internal Medicine

## 2015-03-23 DIAGNOSIS — E7800 Pure hypercholesterolemia, unspecified: Secondary | ICD-10-CM

## 2015-03-23 LAB — HEPATIC FUNCTION PANEL
ALBUMIN: 4.4 g/dL (ref 3.5–5.2)
ALT: 19 U/L (ref 0–35)
AST: 21 U/L (ref 0–37)
Alkaline Phosphatase: 58 U/L (ref 39–117)
BILIRUBIN DIRECT: 0.1 mg/dL (ref 0.0–0.3)
BILIRUBIN TOTAL: 0.5 mg/dL (ref 0.2–1.2)
Total Protein: 6.5 g/dL (ref 6.0–8.3)

## 2015-03-23 LAB — SURGICAL PATHOLOGY

## 2015-04-15 ENCOUNTER — Ambulatory Visit (INDEPENDENT_AMBULATORY_CARE_PROVIDER_SITE_OTHER): Payer: Medicare Other | Admitting: Internal Medicine

## 2015-04-15 ENCOUNTER — Encounter: Payer: Self-pay | Admitting: Internal Medicine

## 2015-04-15 VITALS — BP 120/60 | HR 67 | Temp 98.5°F | Ht 64.0 in | Wt 190.2 lb

## 2015-04-15 DIAGNOSIS — J4520 Mild intermittent asthma, uncomplicated: Secondary | ICD-10-CM

## 2015-04-15 DIAGNOSIS — M179 Osteoarthritis of knee, unspecified: Secondary | ICD-10-CM

## 2015-04-15 DIAGNOSIS — K2100 Gastro-esophageal reflux disease with esophagitis, without bleeding: Secondary | ICD-10-CM

## 2015-04-15 DIAGNOSIS — M1710 Unilateral primary osteoarthritis, unspecified knee: Secondary | ICD-10-CM

## 2015-04-15 DIAGNOSIS — Z0000 Encounter for general adult medical examination without abnormal findings: Secondary | ICD-10-CM

## 2015-04-15 DIAGNOSIS — Z86010 Personal history of colonic polyps: Secondary | ICD-10-CM

## 2015-04-15 DIAGNOSIS — F329 Major depressive disorder, single episode, unspecified: Secondary | ICD-10-CM

## 2015-04-15 DIAGNOSIS — E7800 Pure hypercholesterolemia, unspecified: Secondary | ICD-10-CM

## 2015-04-15 DIAGNOSIS — I2510 Atherosclerotic heart disease of native coronary artery without angina pectoris: Secondary | ICD-10-CM | POA: Diagnosis not present

## 2015-04-15 DIAGNOSIS — R0989 Other specified symptoms and signs involving the circulatory and respiratory systems: Secondary | ICD-10-CM

## 2015-04-15 DIAGNOSIS — M4800 Spinal stenosis, site unspecified: Secondary | ICD-10-CM

## 2015-04-15 DIAGNOSIS — I10 Essential (primary) hypertension: Secondary | ICD-10-CM

## 2015-04-15 DIAGNOSIS — F32A Depression, unspecified: Secondary | ICD-10-CM

## 2015-04-15 NOTE — Progress Notes (Signed)
Pre visit review using our clinic review tool, if applicable. No additional management support is needed unless otherwise documented below in the visit note. 

## 2015-04-15 NOTE — Progress Notes (Signed)
Patient ID: Charlotte Wagner, female   DOB: 06/10/1936, 79 y.o.   MRN: [id number]   Subjective:    Patient ID: Charlotte Wagner, female    DOB: 12-18-1935, 79 y.o.   MRN: [id number]  HPI  Patient here for a scheduled follow up.  She just had EGD.  Taking carafate.  Upper GI symptoms appear to be doing well.  Had positive cologuard testing.  Being scheduled for colonoscopy.  Has a history of previous colon polyp.  Reports no cardiac symptoms with increased activity or exertion.  Breathing stable.  No increased sob.  Does report some with increased stairs, but is stable.  Reports nothing out of the ordinary.  Tries to stay active.  Uses her walker.  Back limits her.  Some increased pain.  Not ready for any intervention.  Has f/u planned with GI on 04/19/15.     Past Medical History  Diagnosis Date  . Asthma   . Arthritis     s/p bilateral knees and left hip replacement  . Depression   . History of chicken pox   . Cancer     melanoma right arm  . GERD (gastroesophageal reflux disease)     h/o hiatal hernia  . Allergy   . Arrhythmia     rare PVC's  . Heart murmur   . CAD (coronary artery disease)     s/p CABG 12/1996  . Hypertension   . Hyperlipidemia   . Chronic kidney disease   . Hx of migraines     rare now  . Colon polyps     H/O  . Spinal stenosis   . Hx: UTI (urinary tract infection)   . Urine incontinence     H/O  . PUD (peptic ulcer disease)     remote history  . Lichen planus   . Vitamin D deficiency   . Raynaud's phenomenon   . PMR (polymyalgia rheumatica)     h/o  . Melanoma     Current Outpatient Prescriptions on File Prior to Visit  Medication Sig Dispense Refill  . acetaminophen (TYLENOL) 500 MG tablet Take 1,000 mg by mouth every 6 (six) hours as needed for moderate pain or headache.    [name] amitriptyline (ELAVIL) 25 MG tablet Take 1-2 tablets q hs (Patient taking differently: Take 50 mg by mouth at bedtime. Take 1-2 tablets q hs) 60 tablet 2  . b complex vitamins  tablet Take 1 tablet by mouth daily.    . bisoprolol (ZEBETA) 5 MG tablet Take 1/2 tablet q day (Patient taking differently: Take 2.5 mg by mouth daily. Take 1/2 tablet q day) 30 tablet 3  . Cholecalciferol (VITAMIN D3) 2000 UNITS TABS Take 1 capsule by mouth daily.     . Coenzyme Q10 (COQ-10) 100 MG CAPS Take 1 tablet by mouth daily.    [name] ezetimibe (ZETIA) 10 MG tablet Take 1 tablet (10 mg total) by mouth daily. 30 tablet 5  . fluticasone (FLOVENT HFA) 110 MCG/ACT inhaler Inhale 2 puffs into the lungs 2 (two) times daily. (Patient taking differently: Inhale 2 puffs into the lungs 2 (two) times daily as needed (SOB). ) 1 Inhaler 2  . Fluticasone-Salmeterol (ADVAIR DISKUS) 250-50 MCG/DOSE AEPB Inhale 1 puff into the lungs 2 (two) times daily. 60 each 1  . folic acid (FOLVITE) 1 MG tablet Take 1 tablet (1 mg total) by mouth daily. 30 tablet 5  . hydrocortisone 2.5 % cream Apply 1 application topically 2 (two) times daily  at 10 am and 4 pm. Apply to vulva 30 g 1  . ipratropium (ATROVENT HFA) 17 MCG/ACT inhaler Inhale 2 puffs into the lungs every 6 (six) hours as needed for wheezing. 1 Inhaler 2  . ketoconazole (NIZORAL) 2 % cream Apply 1 application topically daily. 30 g 0  . levalbuterol (XOPENEX HFA) 45 MCG/ACT inhaler Inhale 2 puffs into the lungs every 6 (six) hours as needed for wheezing. 1 Inhaler 2  . Multiple Vitamins-Minerals (HAIR/SKIN/NAILS PO) Take 1 tablet by mouth daily.    . nabumetone (RELAFEN) 750 MG tablet Take 1 tablet (750 mg total) by mouth daily. 30 tablet 2  . Omega-3 Fatty Acids (FISH OIL PO) Take 2,000 mg by mouth daily.    . pantoprazole (PROTONIX) 40 MG tablet take 1 tablet by mouth twice a day 60 tablet 5  . Polyethylene Glycol 3350 (MIRALAX PO) Take 8.5 g by mouth at bedtime.     . ramipril (ALTACE) 10 MG capsule Take 1 capsule (10 mg total) by mouth daily. 30 capsule 5  . spironolactone (ALDACTONE) 25 MG tablet Take 1 tablet (25 mg total) by mouth every morning. 30 tablet  5  . sucralfate (CARAFATE) 1 G tablet Take 1 tablet (1 g total) by mouth 2 (two) times daily as needed. 30 tablet 0  . traZODone (DESYREL) 150 MG tablet Take 1 & 2/3-2 tablets QHS (Patient taking differently: Take 225 mg by mouth at bedtime. ) 60 tablet 2  . verapamil (COVERA HS) 180 MG (CO) 24 hr tablet Take 1 tablet (180 mg total) by mouth 2 (two) times daily. 60 tablet 5  . vitamin B-12 (CYANOCOBALAMIN) 1000 MCG tablet Take 1,000 mcg by mouth daily.    . vitamin C (ASCORBIC ACID) 500 MG tablet Take 500 mg by mouth daily.     No current facility-administered medications on file prior to visit.    Review of Systems  Constitutional: Negative for appetite change and unexpected weight change.  HENT: Negative for congestion and sinus pressure.   Respiratory: Negative for cough and chest tightness.        No increased sob.  Breathing stable.   Cardiovascular: Negative for chest pain, palpitations and leg swelling.  Gastrointestinal: Negative for nausea, vomiting, abdominal pain and diarrhea.  Genitourinary: Negative for dysuria and difficulty urinating.  Musculoskeletal: Positive for back pain. Negative for joint swelling.  Skin: Negative for color change and rash.  Neurological: Negative for dizziness, light-headedness and headaches.  Psychiatric/Behavioral: Negative for dysphoric mood and agitation.       Objective:    Physical Exam  Constitutional: She appears well-developed and well-nourished. No distress.  HENT:  Nose: Nose normal.  Mouth/Throat: Oropharynx is clear and moist.  Neck: Neck supple. No thyromegaly present.  Cardiovascular: Normal rate and regular rhythm.   Left carotid bruit.   Pulmonary/Chest: Breath sounds normal. No respiratory distress. She has no wheezes.  Abdominal: Soft. Bowel sounds are normal. There is no tenderness.  Musculoskeletal: She exhibits no edema or tenderness.  Lymphadenopathy:    She has no cervical adenopathy.  Skin: No rash noted. No  erythema.  Psychiatric: She has a normal mood and affect. Her behavior is normal.    BP 120/60 mmHg  Pulse 67  Temp(Src) 98.5 F (36.9 C) (Oral)  Ht 5\' 4"  (1.626 m)  Wt 190 lb 4 oz (86.297 kg)  BMI 32.64 kg/m2  SpO2 96% Wt Readings from Last 3 Encounters:  04/15/15 190 lb 4 oz (86.297 kg)  03/22/15  183 lb (83.008 kg)  01/31/15 189 lb 3.2 oz (85.821 kg)     Lab Results  Component Value Date   WBC 4.9 12/30/2014   HGB 12.6 12/30/2014   HCT 36.9 12/30/2014   PLT 226.0 12/30/2014   GLUCOSE 93 03/22/2015   CHOL 208* 03/21/2015   TRIG 92.0 03/21/2015   HDL 49.90 03/21/2015   LDLDIRECT 126.7 07/03/2013   LDLCALC 140* 03/21/2015   ALT 19 03/23/2015   AST 21 03/23/2015   NA 134* 03/22/2015   K 4.6 03/22/2015   CL 100 03/22/2015   CREATININE 0.89 03/22/2015   BUN 25* 03/22/2015   CO2 27 03/22/2015   TSH 0.73 06/15/2014    Dg Esophagus  02/24/2015   CLINICAL DATA:  Dysphagia.  EXAM: ESOPHOGRAM/BARIUM SWALLOW  TECHNIQUE: Combined double contrast and single contrast examination performed using effervescent crystals, thick barium liquid, and thin barium liquid.  FLUOROSCOPY TIME:  Fluoroscopy Time:  2 minutes 24 seconds  Number of Acquired Images:  24  COMPARISON:  None.  FINDINGS: Mild prominence of the cricopharyngeus fold noted. Thoracic esophagus is widely patent. No mass lesion. Peristaltic activity normal. Small sliding hiatal hernia noted. Mild reflux noted . Standardized barium tablet passed easily.  IMPRESSION: 1. Mild prominence of the cricopharyngeus fold .  2.  Small hiatal hernia with mild reflux.   Electronically Signed   By: Marcello Moores  Register   On: 02/24/2015 10:33       Assessment & Plan:   Problem List Items Addressed This Visit    Asthma    Breathing stable.        CAD (coronary artery disease)    S/p CABG 1998.  ECHO 11/28/10 EF 55-60%.  Continue risk factor modification.  See previous note for details.  Mild pulmonary hypertension.  Breathing stable.          Depression    Does not feel she needs any further intervention at this time.  Follow.       Essential hypertension, benign    Blood pressure under good control.  Same medication regimen.  Follow pressures.  Follow metabolic panel.       GERD (gastroesophageal reflux disease) - Primary    EGD 03/22/15 - as outlined in overview.  Small hiatal hernia. Mild chronic gastritis.  On carafate and protonix.  Symptoms controlled.        Health care maintenance    Schedule her for a physical.  Planning for colonoscopy soon.  Mammogram 02/01/15 - Birads I.       History of colonic polyps    Last colonoscopy 08/10/10 - tubular adenoma.  Negative for high grade dysplasia.  Recommended f/u in five years.  Recent cologard positive.  Planning for f/u colonoscopy soon.       Hypercholesterolemia    Has had problems with statin medications.  Off now.  Does not want to try another statin.  LDL last checked 140.  Continue diet and exercise.  Follow lipid panel.       Left carotid bruit    Schedule carotid ultrasound.        Relevant Orders   Ambulatory referral to Vascular Surgery   Osteoarthritis    Is s/p bilateral knee replacements and left hip replacement.  Continue using her walker.  Tries to stay active.       Spinal stenosis    Back pain.  Does limit her activity.  Uses a walker.  Desires no further intervention.  I spent 25 minutes with the patient and more than 50% of the time was spent in consultation regarding the above.     Charlotte Pheasant, MD

## 2015-04-18 ENCOUNTER — Encounter: Payer: Self-pay | Admitting: Internal Medicine

## 2015-04-18 DIAGNOSIS — R0989 Other specified symptoms and signs involving the circulatory and respiratory systems: Secondary | ICD-10-CM | POA: Insufficient documentation

## 2015-04-18 DIAGNOSIS — Z0000 Encounter for general adult medical examination without abnormal findings: Secondary | ICD-10-CM | POA: Insufficient documentation

## 2015-04-18 NOTE — Assessment & Plan Note (Signed)
Schedule her for a physical.  Planning for colonoscopy soon.  Mammogram 02/01/15 - Birads I.

## 2015-04-18 NOTE — Assessment & Plan Note (Signed)
Last colonoscopy 08/10/10 - tubular adenoma.  Negative for high grade dysplasia.  Recommended f/u in five years.  Recent cologard positive.  Planning for f/u colonoscopy soon.

## 2015-04-18 NOTE — Assessment & Plan Note (Signed)
Is s/p bilateral knee replacements and left hip replacement.  Continue using her walker.  Tries to stay active.

## 2015-04-18 NOTE — Assessment & Plan Note (Signed)
Has had problems with statin medications.  Off now.  Does not want to try another statin.  LDL last checked 140.  Continue diet and exercise.  Follow lipid panel.

## 2015-04-18 NOTE — Assessment & Plan Note (Signed)
Schedule carotid ultrasound.  

## 2015-04-18 NOTE — Assessment & Plan Note (Signed)
Blood pressure under good control.  Same medication regimen.  Follow pressures.  Follow metabolic panel.  

## 2015-04-18 NOTE — Assessment & Plan Note (Signed)
EGD 03/22/15 - as outlined in overview.  Small hiatal hernia. Mild chronic gastritis.  On carafate and protonix.  Symptoms controlled.

## 2015-04-18 NOTE — Assessment & Plan Note (Signed)
Breathing stable.

## 2015-04-18 NOTE — Assessment & Plan Note (Signed)
Does not feel she needs any further intervention at this time.  Follow.

## 2015-04-18 NOTE — Assessment & Plan Note (Signed)
S/p CABG 1998.  ECHO 11/28/10 EF 55-60%.  Continue risk factor modification.  See previous note for details.  Mild pulmonary hypertension.  Breathing stable.

## 2015-04-18 NOTE — Assessment & Plan Note (Signed)
Back pain.  Does limit her activity.  Uses a walker.  Desires no further intervention.

## 2015-04-19 DIAGNOSIS — Z86010 Personal history of colonic polyps: Secondary | ICD-10-CM | POA: Diagnosis not present

## 2015-04-21 ENCOUNTER — Other Ambulatory Visit: Payer: Self-pay

## 2015-04-21 MED ORDER — TRAZODONE HCL 150 MG PO TABS: ORAL_TABLET | ORAL | Status: DC

## 2015-04-25 DIAGNOSIS — R0989 Other specified symptoms and signs involving the circulatory and respiratory systems: Secondary | ICD-10-CM | POA: Diagnosis not present

## 2015-04-29 ENCOUNTER — Telehealth: Payer: Self-pay | Admitting: Internal Medicine

## 2015-04-29 NOTE — Telephone Encounter (Signed)
Pt notified of results

## 2015-04-29 NOTE — Telephone Encounter (Signed)
Notify pt that her carotid ultrasound did not reveal any significant blockage in the internal carotids.  She did have some build up in the external carotid - but nothing different to do related to this.

## 2015-05-03 ENCOUNTER — Ambulatory Visit
Admission: RE | Admit: 2015-05-03 | Discharge: 2015-05-03 | Disposition: A | Discharge status: Home / Self Care | Payer: Medicare Other | Source: Ambulatory Visit | Attending: Gastroenterology | Admitting: Gastroenterology

## 2015-05-03 ENCOUNTER — Ambulatory Visit: Payer: Medicare Other | Admitting: Anesthesiology

## 2015-05-03 ENCOUNTER — Encounter: Payer: Self-pay | Admitting: *Deleted

## 2015-05-03 ENCOUNTER — Encounter
Admission: RE | Disposition: A | Discharge status: Home / Self Care | Payer: Self-pay | Source: Ambulatory Visit | Attending: Gastroenterology

## 2015-05-03 DIAGNOSIS — I10 Essential (primary) hypertension: Secondary | ICD-10-CM | POA: Diagnosis not present

## 2015-05-03 DIAGNOSIS — K5730 Diverticulosis of large intestine without perforation or abscess without bleeding: Secondary | ICD-10-CM | POA: Insufficient documentation

## 2015-05-03 DIAGNOSIS — I129 Hypertensive chronic kidney disease with stage 1 through stage 4 chronic kidney disease, or unspecified chronic kidney disease: Secondary | ICD-10-CM | POA: Insufficient documentation

## 2015-05-03 DIAGNOSIS — Z79899 Other long term (current) drug therapy: Secondary | ICD-10-CM | POA: Diagnosis not present

## 2015-05-03 DIAGNOSIS — E785 Hyperlipidemia, unspecified: Secondary | ICD-10-CM | POA: Diagnosis not present

## 2015-05-03 DIAGNOSIS — Z86010 Personal history of colonic polyps: Secondary | ICD-10-CM | POA: Diagnosis not present

## 2015-05-03 DIAGNOSIS — D125 Benign neoplasm of sigmoid colon: Secondary | ICD-10-CM | POA: Diagnosis not present

## 2015-05-03 DIAGNOSIS — Z96653 Presence of artificial knee joint, bilateral: Secondary | ICD-10-CM | POA: Diagnosis not present

## 2015-05-03 DIAGNOSIS — M353 Polymyalgia rheumatica: Secondary | ICD-10-CM | POA: Diagnosis not present

## 2015-05-03 DIAGNOSIS — Z951 Presence of aortocoronary bypass graft: Secondary | ICD-10-CM | POA: Diagnosis not present

## 2015-05-03 DIAGNOSIS — D123 Benign neoplasm of transverse colon: Secondary | ICD-10-CM | POA: Insufficient documentation

## 2015-05-03 DIAGNOSIS — I2510 Atherosclerotic heart disease of native coronary artery without angina pectoris: Secondary | ICD-10-CM | POA: Insufficient documentation

## 2015-05-03 DIAGNOSIS — K635 Polyp of colon: Secondary | ICD-10-CM | POA: Diagnosis not present

## 2015-05-03 DIAGNOSIS — K219 Gastro-esophageal reflux disease without esophagitis: Secondary | ICD-10-CM | POA: Diagnosis not present

## 2015-05-03 DIAGNOSIS — Z85820 Personal history of malignant melanoma of skin: Secondary | ICD-10-CM | POA: Insufficient documentation

## 2015-05-03 DIAGNOSIS — J45909 Unspecified asthma, uncomplicated: Secondary | ICD-10-CM | POA: Insufficient documentation

## 2015-05-03 DIAGNOSIS — Z96642 Presence of left artificial hip joint: Secondary | ICD-10-CM | POA: Insufficient documentation

## 2015-05-03 DIAGNOSIS — E559 Vitamin D deficiency, unspecified: Secondary | ICD-10-CM | POA: Insufficient documentation

## 2015-05-03 DIAGNOSIS — N189 Chronic kidney disease, unspecified: Secondary | ICD-10-CM | POA: Diagnosis not present

## 2015-05-03 DIAGNOSIS — F329 Major depressive disorder, single episode, unspecified: Secondary | ICD-10-CM | POA: Insufficient documentation

## 2015-05-03 DIAGNOSIS — K648 Other hemorrhoids: Secondary | ICD-10-CM | POA: Diagnosis not present

## 2015-05-03 DIAGNOSIS — I7300 Raynaud's syndrome without gangrene: Secondary | ICD-10-CM | POA: Insufficient documentation

## 2015-05-03 HISTORY — PX: COLONOSCOPY WITH PROPOFOL: SHX5780

## 2015-05-03 SURGERY — COLONOSCOPY WITH PROPOFOL
Anesthesia: General

## 2015-05-03 MED ORDER — PROPOFOL INFUSION 10 MG/ML OPTIME
INTRAVENOUS | Status: DC | PRN
  Administered 2015-05-03: 50 ug/kg/min via INTRAVENOUS

## 2015-05-03 MED ORDER — PROPOFOL INFUSION 10 MG/ML OPTIME: INTRAVENOUS | Status: DC | PRN

## 2015-05-03 MED ORDER — SODIUM CHLORIDE 0.9 % IV SOLN
INTRAVENOUS | Status: DC
  Administered 2015-05-03 (×2): via INTRAVENOUS

## 2015-05-03 NOTE — Anesthesia Postprocedure Evaluation (Signed)
  Anesthesia Post-op Note  Patient: Charlotte Wagner  Procedure(s) Performed: Procedure(s): COLONOSCOPY WITH PROPOFOL (N/A)  Anesthesia type:General  Patient location: PACU  Post pain: Pain level controlled  Post assessment: Post-op Vital signs reviewed, Patient's Cardiovascular Status Stable, Respiratory Function Stable, Patent Airway and No signs of Nausea or vomiting  Post vital signs: Reviewed and stable  Last Vitals:  Filed Vitals:   05/03/15 1604  BP: 107/49  Pulse: 51  Temp: 36.3 C  Resp: 13    Level of consciousness: awake, alert  and patient cooperative  Complications: No apparent anesthesia complications

## 2015-05-03 NOTE — H&P (Signed)
Outpatient short stay form Pre-procedure 05/03/2015 3:15 PM Lollie Sails MD  Primary Physician: Dr [name]  Reason for visit:  colonoscopy  History of present illness:  79 yo f presenting for colonoscopy following a positive cologard test. It has been 10 years since her last colonoscopy.     She tolerated her prep for procedure, she takes no asa or anticoagulants. .  No family history of colon cancer.     Current facility-administered medications:  .  0.9 %  sodium chloride infusion, , Intravenous, Continuous, Lollie Sails, MD, Last Rate: 20 mL/hr at 05/03/15 1432  Prescriptions prior to admission  Medication Sig Dispense Refill Last Dose  . bisoprolol (ZEBETA) 5 MG tablet Take 1/2 tablet q day (Patient taking differently: Take 2.5 mg by mouth daily. Take 1/2 tablet q day) 30 tablet 3 05/02/2015 at Unknown time  . spironolactone (ALDACTONE) 25 MG tablet Take 1 tablet (25 mg total) by mouth every morning. 30 tablet 5 05/03/2015 at 1000  . verapamil (COVERA HS) 180 MG (CO) 24 hr tablet Take 1 tablet (180 mg total) by mouth 2 (two) times daily. 60 tablet 5 05/03/2015 at 1000  . acetaminophen (TYLENOL) 500 MG tablet Take 1,000 mg by mouth every 6 (six) hours as needed for moderate pain or headache.   Taking  . amitriptyline (ELAVIL) 25 MG tablet Take 1-2 tablets q hs (Patient taking differently: Take 50 mg by mouth at bedtime. Take 1-2 tablets q hs) 60 tablet 2 Taking  . b complex vitamins tablet Take 1 tablet by mouth daily.   Taking  . Cholecalciferol (VITAMIN D3) 2000 UNITS TABS Take 1 capsule by mouth daily.    Taking  . Coenzyme Q10 (COQ-10) 100 MG CAPS Take 1 tablet by mouth daily.   Taking  . ezetimibe (ZETIA) 10 MG tablet Take 1 tablet (10 mg total) by mouth daily. 30 tablet 5 Taking  . fluticasone (FLOVENT HFA) 110 MCG/ACT inhaler Inhale 2 puffs into the lungs 2 (two) times daily. (Patient taking differently: Inhale 2 puffs into the lungs 2 (two) times daily as needed  (SOB). ) 1 Inhaler 2 Taking  . Fluticasone-Salmeterol (ADVAIR DISKUS) 250-50 MCG/DOSE AEPB Inhale 1 puff into the lungs 2 (two) times daily. 60 each 1 Taking  . folic acid (FOLVITE) 1 MG tablet Take 1 tablet (1 mg total) by mouth daily. 30 tablet 5 Taking  . hydrocortisone 2.5 % cream Apply 1 application topically 2 (two) times daily at 10 am and 4 pm. Apply to vulva 30 g 1 Taking  . ipratropium (ATROVENT HFA) 17 MCG/ACT inhaler Inhale 2 puffs into the lungs every 6 (six) hours as needed for wheezing. 1 Inhaler 2 Taking  . ketoconazole (NIZORAL) 2 % cream Apply 1 application topically daily. 30 g 0 Taking  . levalbuterol (XOPENEX HFA) 45 MCG/ACT inhaler Inhale 2 puffs into the lungs every 6 (six) hours as needed for wheezing. 1 Inhaler 2 Taking  . Multiple Vitamins-Minerals (HAIR/SKIN/NAILS PO) Take 1 tablet by mouth daily.   Taking  . nabumetone (RELAFEN) 750 MG tablet Take 1 tablet (750 mg total) by mouth daily. 30 tablet 2 Taking  . Omega-3 Fatty Acids (FISH OIL PO) Take 2,000 mg by mouth daily.   Taking  . pantoprazole (PROTONIX) 40 MG tablet take 1 tablet by mouth twice a day 60 tablet 5 Taking  . Polyethylene Glycol 3350 (MIRALAX PO) Take 8.5 g by mouth at bedtime.    Taking  . ramipril (ALTACE) 10  MG capsule Take 1 capsule (10 mg total) by mouth daily. 30 capsule 5 Taking  . sucralfate (CARAFATE) 1 G tablet Take 1 tablet (1 g total) by mouth 2 (two) times daily as needed. 30 tablet 0 Taking  . traZODone (DESYREL) 150 MG tablet Take 1 & 2/3-2 tablets QHS 60 tablet 2   . vitamin B-12 (CYANOCOBALAMIN) 1000 MCG tablet Take 1,000 mcg by mouth daily.   Taking  . vitamin C (ASCORBIC ACID) 500 MG tablet Take 500 mg by mouth daily.   Taking     Allergies  Allergen Reactions  . Aspirin Other (See Comments)    Heartburn   . Penicillins Hives  . Statins Other (See Comments)    Muscle weakness  . Sulfa Antibiotics   . Tetracyclines & Related   . Tetracycline Rash     Past Medical History   Diagnosis Date  . Asthma   . Arthritis     s/p bilateral knees and left hip replacement  . Depression   . History of chicken pox   . Cancer     melanoma right arm  . GERD (gastroesophageal reflux disease)     h/o hiatal hernia  . Allergy   . Arrhythmia     rare PVC's  . Heart murmur   . CAD (coronary artery disease)     s/p CABG 12/1996  . Hypertension   . Hyperlipidemia   . Chronic kidney disease   . Hx of migraines     rare now  . Colon polyps     H/O  . Spinal stenosis   . Hx: UTI (urinary tract infection)   . Urine incontinence     H/O  . PUD (peptic ulcer disease)     remote history  . Lichen planus   . Vitamin D deficiency   . Raynaud's phenomenon   . PMR (polymyalgia rheumatica)     h/o  . Melanoma     Review of systems:      Physical Exam    Heart and lungs: clear to auscultation biloaterally    HEENT: ncat    Other:     Pertinant exam for procedure: soft, nontender, nondistended, bs positive normoactive.     Planned proceedures: colonoscopy and indicated proceedures.  I have discussed the risks benefits and complications of procedures to include not limited to bleeding, infection, perforation and the risk of sedation and the patient wishes to proceed.    Lollie Sails, MD Gastroenterology 05/03/2015  3:15 PM

## 2015-05-03 NOTE — Op Note (Signed)
[facility] Gastroenterology Patient Name: Charlotte Wagner Procedure Date: 05/03/2015 3:08 PM MRN: [id number] Account #: [id number] Date of Birth: 11/09/1935 Admit Type: Outpatient Age: 79 Room: [facility] ENDO ROOM 4 Gender: Female Note Status: Finalized Procedure:         Colonoscopy Indications:       Personal history of colonic polyps, positive cologard test Providers:         Lollie Sails, MD Referring MD:      [name], MD (Referring MD) Medicines:         Monitored Anesthesia Care Complications:     No immediate complications. Procedure:         Pre-Anesthesia Assessment:                    - ASA Grade Assessment: II - A patient with mild systemic                     disease.                    - ASA Grade Assessment: III - A patient with severe                     systemic disease.                    After obtaining informed consent, the colonoscope was                     passed under direct vision. Throughout the procedure, the                     patient's blood pressure, pulse, and oxygen saturations                     were monitored continuously. The Olympus PCF-H180AL                     colonoscope ( S#: [id number] ) was introduced through the                     anus and advanced to the the cecum, identified by                     appendiceal orifice and ileocecal valve. The colonoscopy                     was performed without difficulty. The patient tolerated                     the procedure well. The quality of the bowel preparation                     was fair. Findings:      Many small and large-mouthed diverticula were found in the entire colon.      Two sessile polyps were found at the hepatic flexure. The polyps were 2       to 4 mm in size. These polyps were removed with a cold biopsy forceps.       Resection and retrieval were complete.      A 2 mm polyp was found in the sigmoid colon. The polyp was sessile. The       polyp was  removed with a cold biopsy forceps. Resection and retrieval  were complete.      Non-bleeding internal hemorrhoids were found during retroflexion and       during anoscopy. The hemorrhoids were small.      The digital rectal exam was normal. Impression:        - Diverticulosis in the entire examined colon.                    - Two 2 to 4 mm polyps at the hepatic flexure. Resected                     and retrieved.                    - One 2 mm polyp in the sigmoid colon. Resected and                     retrieved.                    - Non-bleeding internal hemorrhoids. Recommendation:    - Await pathology results.                    - Telephone GI clinic for pathology results in 1 week. Procedure Code(s): --- Professional ---                    [id number], Colonoscopy, flexible; with biopsy, single or                     multiple Diagnosis Code(s): --- Professional ---                    211.3, Benign neoplasm of colon                    455.0, Internal hemorrhoids without mention of complication                    V12.72, Personal history of colonic polyps                    562.10, Diverticulosis of colon (without mention of                     hemorrhage) CPT copyright 2014 American Medical Association. All rights reserved. The codes documented in this report are preliminary and upon coder review may  be revised to meet current compliance requirements. Lollie Sails, MD 05/03/2015 4:04:49 PM This report has been signed electronically. Number of Addenda: 0 Note Initiated On: 05/03/2015 3:08 PM Scope Withdrawal Time: 0 hours 10 minutes 4 seconds  Total Procedure Duration: 0 hours 31 minutes 13 seconds       [facility]

## 2015-05-03 NOTE — Anesthesia Preprocedure Evaluation (Signed)
Anesthesia Evaluation  Patient identified by MRN, date of birth, ID band Patient awake    Reviewed: Allergy & Precautions, NPO status , Patient's Chart, lab work & pertinent test results, reviewed documented beta blocker date and time   Airway Mallampati: II  TM Distance: >3 FB     Dental  (+) Chipped, Missing, Partial Lower   Pulmonary asthma , former smoker,          Cardiovascular hypertension, + CAD + dysrhythmias + Valvular Problems/Murmurs     Neuro/Psych  Headaches, PSYCHIATRIC DISORDERS Depression  Neuromuscular disease    GI/Hepatic hiatal hernia, PUD, GERD-  ,  Endo/Other    Renal/GU Renal InsufficiencyRenal disease     Musculoskeletal  (+) Arthritis -,   Abdominal   Peds  Hematology   Anesthesia Other Findings   Reproductive/Obstetrics                             Anesthesia Physical Anesthesia Plan  ASA: III  Anesthesia Plan: General   Post-op Pain Management:    Induction: Intravenous  Airway Management Planned: Nasal Cannula  Additional Equipment:   Intra-op Plan:   Post-operative Plan:   Informed Consent: I have reviewed the patients History and Physical, chart, labs and discussed the procedure including the risks, benefits and alternatives for the proposed anesthesia with the patient or authorized representative who has indicated his/her understanding and acceptance.     Plan Discussed with: CRNA  Anesthesia Plan Comments:         Anesthesia Quick Evaluation

## 2015-05-03 NOTE — Transfer of Care (Signed)
Immediate Anesthesia Transfer of Care Note  Patient: Charlotte Wagner  Procedure(s) Performed: Procedure(s): COLONOSCOPY WITH PROPOFOL (N/A)  Patient Location: PACU and Endoscopy Unit  Anesthesia Type:General  Level of Consciousness: awake, alert  and oriented  Airway & Oxygen Therapy: Patient Spontanous Breathing and Patient connected to nasal cannula oxygen  Post-op Assessment: Report given to RN and Post -op Vital signs reviewed and stable  Post vital signs: Reviewed and stable  Last Vitals:  Filed Vitals:   05/03/15 1604  BP: 107/49  Pulse: 51  Temp: 36.3 C  Resp: 13    Complications: No apparent anesthesia complications

## 2015-05-05 LAB — SURGICAL PATHOLOGY

## 2015-05-06 ENCOUNTER — Other Ambulatory Visit: Payer: Self-pay

## 2015-05-06 ENCOUNTER — Encounter: Payer: Self-pay | Admitting: Gastroenterology

## 2015-05-06 MED ORDER — AMITRIPTYLINE HCL 25 MG PO TABS: ORAL_TABLET | ORAL | Status: DC

## 2015-05-12 ENCOUNTER — Ambulatory Visit (INDEPENDENT_AMBULATORY_CARE_PROVIDER_SITE_OTHER): Payer: Medicare Other | Admitting: Nurse Practitioner

## 2015-05-12 VITALS — BP 110/74 | HR 58 | Temp 97.8°F | Resp 14 | Ht 66.0 in | Wt 187.8 lb

## 2015-05-12 DIAGNOSIS — R300 Dysuria: Secondary | ICD-10-CM

## 2015-05-12 DIAGNOSIS — I2510 Atherosclerotic heart disease of native coronary artery without angina pectoris: Secondary | ICD-10-CM

## 2015-05-12 LAB — POCT URINALYSIS DIPSTICK
BILIRUBIN UA: NEGATIVE
Blood, UA: NEGATIVE
Glucose, UA: NEGATIVE
Ketones, UA: NEGATIVE
Leukocytes, UA: NEGATIVE
Nitrite, UA: NEGATIVE
Protein, UA: NEGATIVE
Spec Grav, UA: 1.01
Urobilinogen, UA: 0.2
pH, UA: 5.5

## 2015-05-12 NOTE — Progress Notes (Signed)
Pre visit review using our clinic review tool, if applicable. No additional management support is needed unless otherwise documented below in the visit note. 

## 2015-05-12 NOTE — Progress Notes (Signed)
   Subjective:    Patient ID: Charlotte Wagner, female    DOB: January 15, 1936, 79 y.o.   MRN: [id number]  HPI  Ms. Lamy is a 79 yo female with a CC of UTI.   1) Burning, urgency, bladder spasms worse over 2 days.    Hydorcortisone cream 0.25%, miconizole cream as recent treatments.   Review of Systems  Constitutional: Negative for fever, chills, diaphoresis and fatigue.  Genitourinary: Positive for dysuria and urgency. Negative for frequency, hematuria, flank pain, vaginal bleeding, vaginal discharge, difficulty urinating and vaginal pain.  Skin: Negative for rash.  Neurological: Negative for dizziness, weakness, numbness and headaches.  Psychiatric/Behavioral: The patient is not nervous/anxious.       Objective:   Physical Exam  Constitutional: She is oriented to person, place, and time. She appears well-developed and well-nourished. No distress.  BP 110/74 mmHg  Pulse 58  Temp(Src) 97.8 F (36.6 C)  Resp 14  Ht 5\' 6"  (1.676 m)  Wt 187 lb 12.8 oz (85.186 kg)  BMI 30.33 kg/m2  SpO2 98%   HENT:  Head: Normocephalic and atraumatic.  Right Ear: External ear normal.  Left Ear: External ear normal.  Abdominal: Soft. She exhibits no distension and no mass. There is no tenderness. There is no rebound and no guarding.  Neurological: She is alert and oriented to person, place, and time. No cranial nerve deficit. She exhibits normal muscle tone. Coordination normal.  Skin: Skin is warm and dry. No rash noted. She is not diaphoretic.  Psychiatric: She has a normal mood and affect. Her behavior is normal. Judgment and thought content normal.      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  4) FU prn worsening/failure to improve.

## 2015-05-13 LAB — URINE CULTURE
Colony Count: NO GROWTH
Organism ID, Bacteria: NO GROWTH

## 2015-05-17 ENCOUNTER — Telehealth: Payer: Self-pay | Admitting: *Deleted

## 2015-05-17 NOTE — Telephone Encounter (Signed)
Pt left voicemail asking to go back up to 2 Nabumetone daily instead of once a day. Pt feels that she may have a flexion contracture of her ring finger. If agreeable would like a nex Rx sent to [facility]. Please advise.

## 2015-05-18 ENCOUNTER — Encounter: Payer: Self-pay | Admitting: Internal Medicine

## 2015-05-18 NOTE — Telephone Encounter (Signed)
Please call pt and confirm if feels needs to be evaluated.  I would prefer to hold off on higher doses of Nabumetone (relafen).  Concern of increase causing stomach issues or affecting kidney function or blood pressure.  Can add tylenol.  Let me know if she needs anything.

## 2015-05-18 NOTE — Telephone Encounter (Signed)
Pt coming in tomorrow (8/11) @ 4:30. Appt approved by Dr. Nicki Reaper

## 2015-05-18 NOTE — Telephone Encounter (Signed)
See if she can come in at 11:45 today - work in for this.

## 2015-05-18 NOTE — Telephone Encounter (Signed)
Pt states that she is already taking Tylenol & would rather be seen. She can come in tomorrow morning or around 11 on Friday.

## 2015-05-19 ENCOUNTER — Encounter: Payer: Self-pay | Admitting: Nurse Practitioner

## 2015-05-19 ENCOUNTER — Ambulatory Visit (INDEPENDENT_AMBULATORY_CARE_PROVIDER_SITE_OTHER): Payer: Medicare Other | Admitting: Internal Medicine

## 2015-05-19 VITALS — BP 108/68 | HR 63 | Temp 97.8°F | Ht 66.0 in | Wt 188.6 lb

## 2015-05-19 DIAGNOSIS — K2100 Gastro-esophageal reflux disease with esophagitis, without bleeding: Secondary | ICD-10-CM

## 2015-05-19 DIAGNOSIS — M179 Osteoarthritis of knee, unspecified: Secondary | ICD-10-CM

## 2015-05-19 DIAGNOSIS — I10 Essential (primary) hypertension: Secondary | ICD-10-CM

## 2015-05-19 DIAGNOSIS — M79646 Pain in unspecified finger(s): Secondary | ICD-10-CM | POA: Diagnosis not present

## 2015-05-19 DIAGNOSIS — I2510 Atherosclerotic heart disease of native coronary artery without angina pectoris: Secondary | ICD-10-CM | POA: Diagnosis not present

## 2015-05-19 DIAGNOSIS — M1710 Unilateral primary osteoarthritis, unspecified knee: Secondary | ICD-10-CM

## 2015-05-19 MED ORDER — NABUMETONE 750 MG PO TABS: 750.0000 mg | ORAL_TABLET | Freq: Every day | ORAL | Status: DC

## 2015-05-19 NOTE — Progress Notes (Signed)
Pre visit review using our clinic review tool, if applicable. No additional management support is needed unless otherwise documented below in the visit note. 

## 2015-05-21 ENCOUNTER — Encounter: Payer: Self-pay | Admitting: Internal Medicine

## 2015-05-21 NOTE — Progress Notes (Signed)
Patient ID: Charlotte Wagner, female   DOB: 06/22/1936, 79 y.o.   MRN: [id number]   Subjective:    Patient ID: Charlotte Wagner, female    DOB: 1936/05/18, 79 y.o.   MRN: [id number]  HPI  Patient here as a work in with concerns regarding some pain in her finger.  Pain - fourth finger.  Has been trying to exercise it.  Has been taking relafen twice a day.  Here to discuss getting a refill for twice a day relafen.  She tolerates it.  No stomach issues.  Blood pressure has been doing well.  Discussed side effects and risk of medication.  She is aware.  Feels better on the medication.  Moving better.  No sob.  Using a walker to ambulate.     Past Medical History  Diagnosis Date  . Asthma   . Arthritis     s/p bilateral knees and left hip replacement  . Depression   . History of chicken pox   . Cancer     melanoma right arm  . GERD (gastroesophageal reflux disease)     h/o hiatal hernia  . Allergy   . Arrhythmia     rare PVC's  . Heart murmur   . CAD (coronary artery disease)     s/p CABG 12/1996  . Hypertension   . Hyperlipidemia   . Chronic kidney disease   . Hx of migraines     rare now  . Colon polyps     H/O  . Spinal stenosis   . Hx: UTI (urinary tract infection)   . Urine incontinence     H/O  . PUD (peptic ulcer disease)     remote history  . Lichen planus   . Vitamin D deficiency   . Raynaud's phenomenon   . PMR (polymyalgia rheumatica)     h/o  . Melanoma     Family history and social history reviewed.    Outpatient Encounter Prescriptions as of 05/19/2015  Medication Sig  . acetaminophen (TYLENOL) 500 MG tablet Take 1,000 mg by mouth every 6 (six) hours as needed for moderate pain or headache.  [name] amitriptyline (ELAVIL) 25 MG tablet Take 1-2 tablets q hs  . b complex vitamins tablet Take 1 tablet by mouth daily.  . bisoprolol (ZEBETA) 5 MG tablet Take 1/2 tablet q day (Patient taking differently: Take 2.5 mg by mouth daily. Take 1/2 tablet q day)  . Cholecalciferol  (VITAMIN D3) 2000 UNITS TABS Take 1 capsule by mouth daily.   . Coenzyme Q10 (COQ-10) 100 MG CAPS Take 1 tablet by mouth daily.  [name] ezetimibe (ZETIA) 10 MG tablet Take 1 tablet (10 mg total) by mouth daily.  . fluticasone (FLOVENT HFA) 110 MCG/ACT inhaler Inhale 2 puffs into the lungs 2 (two) times daily. (Patient taking differently: Inhale 2 puffs into the lungs 2 (two) times daily as needed (SOB). )  . Fluticasone-Salmeterol (ADVAIR DISKUS) 250-50 MCG/DOSE AEPB Inhale 1 puff into the lungs 2 (two) times daily.  . folic acid (FOLVITE) 1 MG tablet Take 1 tablet (1 mg total) by mouth daily.  . hydrocortisone 2.5 % cream Apply 1 application topically 2 (two) times daily at 10 am and 4 pm. Apply to vulva  . ipratropium (ATROVENT HFA) 17 MCG/ACT inhaler Inhale 2 puffs into the lungs every 6 (six) hours as needed for wheezing.  [name] ketoconazole (NIZORAL) 2 % cream Apply 1 application topically daily.  [name] levalbuterol (XOPENEX HFA) 45 MCG/ACT inhaler Inhale  2 puffs into the lungs every 6 (six) hours as needed for wheezing.  . Multiple Vitamins-Minerals (HAIR/SKIN/NAILS PO) Take 1 tablet by mouth daily.  . nabumetone (RELAFEN) 750 MG tablet Take 1 tablet (750 mg total) by mouth daily.  . Omega-3 Fatty Acids (FISH OIL PO) Take 2,000 mg by mouth daily.  . pantoprazole (PROTONIX) 40 MG tablet take 1 tablet by mouth twice a day  . Polyethylene Glycol 3350 (MIRALAX PO) Take 8.5 g by mouth at bedtime.   . ramipril (ALTACE) 10 MG capsule Take 1 capsule (10 mg total) by mouth daily.  [name] spironolactone (ALDACTONE) 25 MG tablet Take 1 tablet (25 mg total) by mouth every morning.  . sucralfate (CARAFATE) 1 G tablet Take 1 tablet (1 g total) by mouth 2 (two) times daily as needed.  . traZODone (DESYREL) 150 MG tablet Take 1 & 2/3-2 tablets QHS  . verapamil (COVERA HS) 180 MG (CO) 24 hr tablet Take 1 tablet (180 mg total) by mouth 2 (two) times daily.  . vitamin B-12 (CYANOCOBALAMIN) 1000 MCG tablet Take 1,000 mcg by  mouth daily.  . vitamin C (ASCORBIC ACID) 500 MG tablet Take 500 mg by mouth daily.  . [DISCONTINUED] nabumetone (RELAFEN) 750 MG tablet Take 1 tablet (750 mg total) by mouth daily.   No facility-administered encounter medications on file as of 05/19/2015.    Review of Systems  Constitutional: Negative for appetite change and unexpected weight change.  HENT: Negative for congestion and sinus pressure.   Respiratory: Negative for cough, chest tightness and shortness of breath.   Cardiovascular: Negative for chest pain, palpitations and leg swelling.  Gastrointestinal: Negative for abdominal pain.       No acid reflux.    Musculoskeletal:       Finger issues as outlined.  Still with some back pain.  Gradually worsening.    Skin: Negative for color change and rash.  Psychiatric/Behavioral: Negative for dysphoric mood and agitation.       Objective:    Physical Exam  Constitutional: She appears well-developed and well-nourished. No distress.  Neck: Neck supple.  Cardiovascular: Normal rate and regular rhythm.   Pulmonary/Chest: Breath sounds normal. No respiratory distress. She has no wheezes.  Abdominal: Soft. Bowel sounds are normal. There is no tenderness.  Musculoskeletal: She exhibits no edema or tenderness.  Increased pain - fourth finger.  Able to force the finger - to flattening.  No increased erythema.    Lymphadenopathy:    She has no cervical adenopathy.  Skin: No rash noted. No erythema.  Psychiatric: She has a normal mood and affect. Her behavior is normal.    BP 108/68 mmHg  Pulse 63  Temp(Src) 97.8 F (36.6 C) (Oral)  Ht 5\' 6"  (1.676 m)  Wt 188 lb 9.6 oz (85.548 kg)  BMI 30.46 kg/m2  SpO2 96% Wt Readings from Last 3 Encounters:  05/19/15 188 lb 9.6 oz (85.548 kg)  05/12/15 187 lb 12.8 oz (85.186 kg)  05/03/15 183 lb (83.008 kg)     Lab Results  Component Value Date   WBC 4.9 12/30/2014   HGB 12.6 12/30/2014   HCT 36.9 12/30/2014   PLT 226.0  12/30/2014   GLUCOSE 93 03/22/2015   CHOL 208* 03/21/2015   TRIG 92.0 03/21/2015   HDL 49.90 03/21/2015   LDLDIRECT 126.7 07/03/2013   LDLCALC 140* 03/21/2015   ALT 19 03/23/2015   AST 21 03/23/2015   NA 134* 03/22/2015   K 4.6 03/22/2015   CL  100 03/22/2015   CREATININE 0.89 03/22/2015   BUN 25* 03/22/2015   CO2 27 03/22/2015   TSH 0.73 06/15/2014       Assessment & Plan:   Problem List Items Addressed This Visit    CAD (coronary artery disease)    Discussed risk of increased and persistent use of antiinflammatories.  Will increased dose short term.  Follow.        Essential hypertension, benign - Primary    Blood pressure under good control.  Continue same medication regimen.  Follow pressures.  Follow metabolic panel.        GERD (gastroesophageal reflux disease)    EGD as outlined in overview.  Symptoms doing well on her current regimen.  Not taking carafate regularly.  Follow.        Osteoarthritis    Finger pain as outlined.  Taking relafen bid.  Tolerating.  Wants to continue this dose short term.  Refilled bid dosing x 1.  Will need to decrease down as soon as able.  Follow for side effects.  Follow.        Relevant Medications   nabumetone (RELAFEN) 750 MG tablet       [name], MD

## 2015-05-21 NOTE — Assessment & Plan Note (Signed)
EGD as outlined in overview.  Symptoms doing well on her current regimen.  Not taking carafate regularly.  Follow.

## 2015-05-21 NOTE — Assessment & Plan Note (Signed)
Finger pain as outlined.  Taking relafen bid.  Tolerating.  Wants to continue this dose short term.  Refilled bid dosing x 1.  Will need to decrease down as soon as able.  Follow for side effects.  Follow.

## 2015-05-21 NOTE — Assessment & Plan Note (Signed)
Discussed risk of increased and persistent use of antiinflammatories.  Will increased dose short term.  Follow.

## 2015-05-21 NOTE — Assessment & Plan Note (Signed)
Blood pressure under good control.  Continue same medication regimen.  Follow pressures.  Follow metabolic panel.   

## 2015-05-26 ENCOUNTER — Ambulatory Visit: Payer: Medicare Other | Admitting: Internal Medicine

## 2015-05-26 DIAGNOSIS — H269 Unspecified cataract: Secondary | ICD-10-CM | POA: Diagnosis not present

## 2015-06-02 ENCOUNTER — Other Ambulatory Visit: Payer: Self-pay | Admitting: *Deleted

## 2015-06-02 ENCOUNTER — Telehealth: Payer: Self-pay | Admitting: *Deleted

## 2015-06-02 MED ORDER — SUCRALFATE 1 G PO TABS: 1.0000 g | ORAL_TABLET | Freq: Two times a day (BID) | ORAL | Status: DC | PRN

## 2015-06-02 NOTE — Telephone Encounter (Signed)
Patient has requested a sucrufait refill ([facility] on [address]).-Thanks

## 2015-06-09 ENCOUNTER — Other Ambulatory Visit: Payer: Self-pay | Admitting: *Deleted

## 2015-06-09 MED ORDER — FOLIC ACID 1 MG PO TABS: 1.0000 mg | ORAL_TABLET | Freq: Every day | ORAL | Status: DC

## 2015-06-14 ENCOUNTER — Ambulatory Visit (INDEPENDENT_AMBULATORY_CARE_PROVIDER_SITE_OTHER): Payer: Medicare Other | Admitting: Nurse Practitioner

## 2015-06-14 ENCOUNTER — Telehealth: Payer: Self-pay | Admitting: Internal Medicine

## 2015-06-14 VITALS — BP 140/78 | HR 57 | Temp 97.7°F | Resp 14 | Ht 66.0 in | Wt 188.8 lb

## 2015-06-14 DIAGNOSIS — B9789 Other viral agents as the cause of diseases classified elsewhere: Principal | ICD-10-CM

## 2015-06-14 DIAGNOSIS — I2510 Atherosclerotic heart disease of native coronary artery without angina pectoris: Secondary | ICD-10-CM | POA: Diagnosis not present

## 2015-06-14 DIAGNOSIS — J069 Acute upper respiratory infection, unspecified: Secondary | ICD-10-CM

## 2015-06-14 MED ORDER — CLARITHROMYCIN 500 MG PO TABS: 500.0000 mg | ORAL_TABLET | Freq: Two times a day (BID) | ORAL | Status: DC

## 2015-06-14 NOTE — Progress Notes (Signed)
Patient ID: Charlotte Wagner, female    DOB: [birth year]  Age: 79 y.o. MRN: [id number]  CC: Bronchitis   HPI Charlotte Wagner presents for CC of URI x 3-4 weeks.   1) Walking pneumonia in family members recently  Worse during day- cough, non-productive Rhinorrhea Staying hydrated  Advair daily   On relafen once daily   History Charlotte Wagner has a past medical history of Asthma; Arthritis; Depression; History of chicken pox; Cancer; GERD (gastroesophageal reflux disease); Allergy; Arrhythmia; Heart murmur; CAD (coronary artery disease); Hypertension; Hyperlipidemia; Chronic kidney disease; migraines; Colon polyps; Spinal stenosis; UTI (urinary tract infection); Urine incontinence; PUD (peptic ulcer disease); Lichen planus; Vitamin D deficiency; Raynaud's phenomenon; PMR (polymyalgia rheumatica); and Melanoma.   She has past surgical history that includes Cholecystectomy (90's); Breast surgery (Right); Adenoidectomy; Coronary artery bypass graft (98); Knee arthroscopy w/ OATS procedure; Total hip arthroplasty; Joint replacement; Esophagogastroduodenoscopy (N/A, 03/22/2015); and Colonoscopy with propofol (N/A, 05/03/2015).   Her family history includes Alcohol abuse in her father; Depression in her daughter, father, maternal grandmother, and son; Heart disease in her father; Hypertension in her maternal grandfather and paternal grandfather; Lung cancer in her sister; Stroke in her maternal grandmother; Thyroid disease in her daughter and mother.She reports that she has quit smoking. She has never used smokeless tobacco. She reports that she does not drink alcohol or use illicit drugs.  Outpatient Prescriptions Prior to Visit  Medication Sig Dispense Refill  . acetaminophen (TYLENOL) 500 MG tablet Take 1,000 mg by mouth every 6 (six) hours as needed for moderate pain or headache.    [name] amitriptyline (ELAVIL) 25 MG tablet Take 1-2 tablets q hs 60 tablet 2  . b complex vitamins tablet Take 1 tablet by mouth  daily.    . bisoprolol (ZEBETA) 5 MG tablet Take 1/2 tablet q day (Patient taking differently: Take 2.5 mg by mouth daily. Take 1/2 tablet q day) 30 tablet 3  . Cholecalciferol (VITAMIN D3) 2000 UNITS TABS Take 1 capsule by mouth daily.     . Coenzyme Q10 (COQ-10) 100 MG CAPS Take 1 tablet by mouth daily.    [name] ezetimibe (ZETIA) 10 MG tablet Take 1 tablet (10 mg total) by mouth daily. 30 tablet 5  . fluticasone (FLOVENT HFA) 110 MCG/ACT inhaler Inhale 2 puffs into the lungs 2 (two) times daily. (Patient taking differently: Inhale 2 puffs into the lungs 2 (two) times daily as needed (SOB). ) 1 Inhaler 2  . Fluticasone-Salmeterol (ADVAIR DISKUS) 250-50 MCG/DOSE AEPB Inhale 1 puff into the lungs 2 (two) times daily. 60 each 1  . folic acid (FOLVITE) 1 MG tablet Take 1 tablet (1 mg total) by mouth daily. 30 tablet 5  . hydrocortisone 2.5 % cream Apply 1 application topically 2 (two) times daily at 10 am and 4 pm. Apply to vulva 30 g 1  . ipratropium (ATROVENT HFA) 17 MCG/ACT inhaler Inhale 2 puffs into the lungs every 6 (six) hours as needed for wheezing. 1 Inhaler 2  . ketoconazole (NIZORAL) 2 % cream Apply 1 application topically daily. 30 g 0  . levalbuterol (XOPENEX HFA) 45 MCG/ACT inhaler Inhale 2 puffs into the lungs every 6 (six) hours as needed for wheezing. 1 Inhaler 2  . Multiple Vitamins-Minerals (HAIR/SKIN/NAILS PO) Take 1 tablet by mouth daily.    . nabumetone (RELAFEN) 750 MG tablet Take 1 tablet (750 mg total) by mouth daily. 60 tablet 0  . Omega-3 Fatty Acids (FISH OIL PO) Take 2,000 mg by mouth daily.    [name]  pantoprazole (PROTONIX) 40 MG tablet take 1 tablet by mouth twice a day 60 tablet 5  . Polyethylene Glycol 3350 (MIRALAX PO) Take 8.5 g by mouth at bedtime.     . ramipril (ALTACE) 10 MG capsule Take 1 capsule (10 mg total) by mouth daily. 30 capsule 5  . spironolactone (ALDACTONE) 25 MG tablet Take 1 tablet (25 mg total) by mouth every morning. 30 tablet 5  . sucralfate (CARAFATE) 1  G tablet Take 1 tablet (1 g total) by mouth 2 (two) times daily as needed. 30 tablet 0  . traZODone (DESYREL) 150 MG tablet Take 1 & 2/3-2 tablets QHS 60 tablet 2  . verapamil (COVERA HS) 180 MG (CO) 24 hr tablet Take 1 tablet (180 mg total) by mouth 2 (two) times daily. 60 tablet 5  . vitamin B-12 (CYANOCOBALAMIN) 1000 MCG tablet Take 1,000 mcg by mouth daily.    . vitamin C (ASCORBIC ACID) 500 MG tablet Take 500 mg by mouth daily.     No facility-administered medications prior to visit.    ROS Review of Systems  Constitutional: Negative for fever, chills, diaphoresis and fatigue.  HENT: Positive for rhinorrhea. Negative for congestion.   Respiratory: Positive for cough. Negative for chest tightness, shortness of breath and wheezing.   Cardiovascular: Negative for chest pain, palpitations and leg swelling.  Gastrointestinal: Negative for nausea, vomiting and diarrhea.  Genitourinary: Negative for genital sores.  Skin: Negative for rash.  Neurological: Negative for dizziness, weakness, numbness and headaches.  Psychiatric/Behavioral: The patient is not nervous/anxious.     Objective:  BP 140/78 mmHg  Pulse 57  Temp(Src) 97.7 F (36.5 C)  Resp 14  Ht 5\' 6"  (1.676 m)  Wt 188 lb 12.8 oz (85.639 kg)  BMI 30.49 kg/m2  SpO2 97%  Physical Exam  Constitutional: She is oriented to person, place, and time. She appears well-developed and well-nourished. No distress.  HENT:  Head: Normocephalic and atraumatic.  Right Ear: External ear normal.  Left Ear: External ear normal.  Cardiovascular: Normal rate, regular rhythm and normal heart sounds.   Pulmonary/Chest: Effort normal. No respiratory distress. She has wheezes. She has rales. She exhibits no tenderness.  Decreased sounds on left lower lobe with rales  Neurological: She is alert and oriented to person, place, and time. No cranial nerve deficit. She exhibits normal muscle tone. Coordination normal.  Skin: Skin is warm and dry. No  rash noted. She is not diaphoretic.  Psychiatric: She has a normal mood and affect. Her behavior is normal. Judgment and thought content normal.   Assessment & Plan:   Ed was seen today for bronchitis.  Diagnoses and all orders for this visit:  Viral URI with cough  Other orders -     clarithromycin (BIAXIN) 500 MG tablet; Take 1 tablet (500 mg total) by mouth 2 (two) times daily.  I am having Charlotte Wagner start on clarithromycin. I am also having her maintain her Polyethylene Glycol 3350 (MIRALAX PO), b complex vitamins, vitamin B-12, Vitamin D3, Omega-3 Fatty Acids (FISH OIL PO), vitamin C, ketoconazole, levalbuterol, fluticasone, bisoprolol, ezetimibe, ramipril, spironolactone, pantoprazole, hydrocortisone, acetaminophen, CoQ-10, Multiple Vitamins-Minerals (HAIR/SKIN/NAILS PO), verapamil, ipratropium, Fluticasone-Salmeterol, traZODone, amitriptyline, nabumetone, sucralfate, and folic acid.  Meds ordered this encounter  Medications  . clarithromycin (BIAXIN) 500 MG tablet    Sig: Take 1 tablet (500 mg total) by mouth 2 (two) times daily.    Dispense:  14 tablet    Refill:  0    Order Specific Question:  Supervising Provider    Answer:  Crecencio Mc [[id number]]     Follow-up: No Follow-up on file.

## 2015-06-14 NOTE — Progress Notes (Signed)
Pre visit review using our clinic review tool, if applicable. No additional management support is needed unless otherwise documented below in the visit note. 

## 2015-06-14 NOTE — Telephone Encounter (Signed)
Pt states she is still having issues with Bronchitis and it's not clearing up after using inhalers for 3 weeks. Pt states she needs an antibiotic. Pt family is having issues with mycoplasma. Pharmacy is [facility] on [address] No appt avail to sch. Pt is avail tomorrow til 1pm. Thank You!

## 2015-06-14 NOTE — Telephone Encounter (Signed)
Pt scheduled  

## 2015-06-14 NOTE — Patient Instructions (Signed)
Call us if no better in 72 hours.

## 2015-06-16 ENCOUNTER — Encounter: Payer: Self-pay | Admitting: Nurse Practitioner

## 2015-06-16 DIAGNOSIS — J069 Acute upper respiratory infection, unspecified: Secondary | ICD-10-CM | POA: Insufficient documentation

## 2015-06-16 DIAGNOSIS — B9789 Other viral agents as the cause of diseases classified elsewhere: Principal | ICD-10-CM

## 2015-06-16 NOTE — Assessment & Plan Note (Signed)
Possibly viral, but can't r/out walking pna. Will treat with Biaxin 500 mg bid x 7 days. Conservative therapy and FU prn worsening/failure to improve.

## 2015-06-24 ENCOUNTER — Other Ambulatory Visit: Payer: Self-pay | Admitting: *Deleted

## 2015-06-24 ENCOUNTER — Telehealth: Payer: Self-pay | Admitting: *Deleted

## 2015-06-24 MED ORDER — NABUMETONE 750 MG PO TABS: 750.0000 mg | ORAL_TABLET | Freq: Every day | ORAL | Status: DC

## 2015-06-24 NOTE — Telephone Encounter (Signed)
Pt called with update on Nabumetone 750mg . Pt states that she seems to be tolerating it well but still has some mild sorness of hands. If okay to stay on medication, will need a refill. Need to know if it needs to be sent in once or twice a day. Pt states that she has been taking one a day. Please advise

## 2015-06-24 NOTE — Telephone Encounter (Signed)
Per last note, she had wanted to be on bid dosing until this flare was better.  If still needing or wanting bid, can refill x 1 bid.  If q day, then ok to refill x 2.

## 2015-06-24 NOTE — Telephone Encounter (Signed)
Rx sent in for once a day dosing

## 2015-07-14 DIAGNOSIS — Z23 Encounter for immunization: Secondary | ICD-10-CM | POA: Diagnosis not present

## 2015-07-16 ENCOUNTER — Other Ambulatory Visit: Payer: Self-pay | Admitting: Internal Medicine

## 2015-07-20 ENCOUNTER — Other Ambulatory Visit: Payer: Self-pay | Admitting: Internal Medicine

## 2015-07-31 ENCOUNTER — Other Ambulatory Visit: Payer: Self-pay | Admitting: Internal Medicine

## 2015-08-16 ENCOUNTER — Encounter: Payer: Self-pay | Admitting: Internal Medicine

## 2015-08-16 ENCOUNTER — Ambulatory Visit (INDEPENDENT_AMBULATORY_CARE_PROVIDER_SITE_OTHER): Payer: Medicare Other | Admitting: Internal Medicine

## 2015-08-16 VITALS — BP 118/60 | HR 62 | Temp 97.7°F | Resp 18 | Ht 63.5 in | Wt 189.0 lb

## 2015-08-16 DIAGNOSIS — I10 Essential (primary) hypertension: Secondary | ICD-10-CM | POA: Diagnosis not present

## 2015-08-16 DIAGNOSIS — Z0000 Encounter for general adult medical examination without abnormal findings: Secondary | ICD-10-CM

## 2015-08-16 DIAGNOSIS — K2100 Gastro-esophageal reflux disease with esophagitis, without bleeding: Secondary | ICD-10-CM

## 2015-08-16 DIAGNOSIS — F32A Depression, unspecified: Secondary | ICD-10-CM

## 2015-08-16 DIAGNOSIS — I2510 Atherosclerotic heart disease of native coronary artery without angina pectoris: Secondary | ICD-10-CM

## 2015-08-16 DIAGNOSIS — M1710 Unilateral primary osteoarthritis, unspecified knee: Secondary | ICD-10-CM

## 2015-08-16 DIAGNOSIS — M179 Osteoarthritis of knee, unspecified: Secondary | ICD-10-CM | POA: Diagnosis not present

## 2015-08-16 DIAGNOSIS — Z86010 Personal history of colonic polyps: Secondary | ICD-10-CM

## 2015-08-16 DIAGNOSIS — M4800 Spinal stenosis, site unspecified: Secondary | ICD-10-CM

## 2015-08-16 DIAGNOSIS — E7800 Pure hypercholesterolemia, unspecified: Secondary | ICD-10-CM

## 2015-08-16 DIAGNOSIS — F329 Major depressive disorder, single episode, unspecified: Secondary | ICD-10-CM

## 2015-08-16 MED ORDER — NABUMETONE 750 MG PO TABS: ORAL_TABLET | ORAL | Status: DC

## 2015-08-16 MED ORDER — TRAMADOL HCL 50 MG PO TABS: 50.0000 mg | ORAL_TABLET | Freq: Every day | ORAL | Status: DC | PRN

## 2015-08-16 NOTE — Progress Notes (Signed)
Pre-visit discussion using our clinic review tool. No additional management support is needed unless otherwise documented below in the visit note.  

## 2015-08-16 NOTE — Progress Notes (Signed)
Patient ID: Charlotte Wagner, female   DOB: 10/16/1935, 79 y.o.   MRN: [id number]   Subjective:    Patient ID: Charlotte Wagner, female    DOB: 1936/07/17, 79 y.o.   MRN: [id number]  HPI  Patient with past history of depression, GERD, documented asthma, hypercholesterolemia and spinal stenosis.  She comes in today to follow up on these issues as well as for a complete physical exam.  She feels things are stable.  Has persistent issues with her back and her gait.  Doing exercises.  Has been to therapy.  Plans to do more core strengthening exercises.  Would like refill on tramadol.  Rarely takes.  Has had a prescription for 20 tablets since 2013.  Still has pills left.  She is tapering her amitriptyline.  Doing well with this.  Also has decreased the dose on her trazodone.  She also needs a few additional relafen per month.  She is unable to function on some days without the relafen.  We discussed GI side effects.  She is tolerating.  Feels from a cardiac standpoint - doing well.  Breathing stable.  Bowels doing well.     Past Medical History  Diagnosis Date  . Asthma   . Arthritis     s/p bilateral knees and left hip replacement  . Depression   . History of chicken pox   . Cancer ([place])     melanoma right arm  . GERD (gastroesophageal reflux disease)     h/o hiatal hernia  . Allergy   . Arrhythmia     rare PVC's  . Heart murmur   . CAD (coronary artery disease)     s/p CABG 12/1996  . Hypertension   . Hyperlipidemia   . Chronic kidney disease   . Hx of migraines     rare now  . Colon polyps     H/O  . Spinal stenosis   . Hx: UTI (urinary tract infection)   . Urine incontinence     H/O  . PUD (peptic ulcer disease)     remote history  . Lichen planus   . Vitamin D deficiency   . Raynaud's phenomenon   . PMR (polymyalgia rheumatica) ([facility])     h/o  . Melanoma [facility])    Past Surgical History  Procedure Laterality Date  . Cholecystectomy  90's  . Breast surgery Right     biopsy x 3  (all benign)  . Adenoidectomy      age 34  . Coronary artery bypass graft  98  . Knee arthroscopy w/ oats procedure      Lt knee (9/01), Rt knee (3/11), Lt hip (5/10)  . Total hip arthroplasty    . Joint replacement      BILATERAL KNEE REPLACEMENTS  . Esophagogastroduodenoscopy N/A 03/22/2015    Procedure: ESOPHAGOGASTRODUODENOSCOPY (EGD);  Surgeon: Lollie Sails, MD;  Location: [facility] ENDOSCOPY;  Service: Endoscopy;  Laterality: N/A;  . Colonoscopy with propofol N/A 05/03/2015    Procedure: COLONOSCOPY WITH PROPOFOL;  Surgeon: Lollie Sails, MD;  Location: [facility] ENDOSCOPY;  Service: Endoscopy;  Laterality: N/A;   Family History  Problem Relation Age of Onset  . Thyroid disease Mother     graves disease  . Heart disease Father     rheumatic heart  . Alcohol abuse Father   . Depression Father   . Lung cancer Sister   . Depression Daughter   . Thyroid disease Daughter     hashimoto  .  Depression Son   . Stroke Maternal Grandmother   . Depression Maternal Grandmother   . Hypertension Maternal Grandfather   . Hypertension Paternal Grandfather    Social History   Social History  . Marital Status: Married    Spouse Name: N/A  . Number of Children: 3  . Years of Education: N/A   Social History Main Topics  . Smoking status: Former Research scientist (life sciences)  . Smokeless tobacco: Never Used  . Alcohol Use: No  . Drug Use: No  . Sexual Activity: Not Asked   Other Topics Concern  . None   Social History Narrative    Outpatient Encounter Prescriptions as of 08/16/2015  Medication Sig  . acetaminophen (TYLENOL) 500 MG tablet Take 1,000 mg by mouth every 6 (six) hours as needed for moderate pain or headache.  [name] amitriptyline (ELAVIL) 25 MG tablet Take 1-2 tablets q hs (Patient taking differently: Take 25 mg by mouth at bedtime. Take 1-2 tablets q hs)  . b complex vitamins tablet Take 1 tablet by mouth daily.  . bisoprolol (ZEBETA) 5 MG tablet Take 1/2 tablet q day (Patient taking  differently: Take 2.5 mg by mouth daily. Take 1/2 tablet q day)  . Cholecalciferol (VITAMIN D3) 2000 UNITS TABS Take 1 capsule by mouth daily.   . clarithromycin (BIAXIN) 500 MG tablet Take 1 tablet (500 mg total) by mouth 2 (two) times daily.  . Coenzyme Q10 (COQ-10) 100 MG CAPS Take 1 tablet by mouth daily.  . fluticasone (FLOVENT HFA) 110 MCG/ACT inhaler Inhale 2 puffs into the lungs 2 (two) times daily. (Patient taking differently: Inhale 2 puffs into the lungs 2 (two) times daily as needed (SOB). )  . Fluticasone-Salmeterol (ADVAIR DISKUS) 250-50 MCG/DOSE AEPB Inhale 1 puff into the lungs 2 (two) times daily.  . folic acid (FOLVITE) 1 MG tablet Take 1 tablet (1 mg total) by mouth daily.  . hydrocortisone 2.5 % cream Apply 1 application topically 2 (two) times daily at 10 am and 4 pm. Apply to vulva  . ipratropium (ATROVENT HFA) 17 MCG/ACT inhaler Inhale 2 puffs into the lungs every 6 (six) hours as needed for wheezing.  [name] ketoconazole (NIZORAL) 2 % cream Apply 1 application topically daily.  [name] levalbuterol (XOPENEX HFA) 45 MCG/ACT inhaler Inhale 2 puffs into the lungs every 6 (six) hours as needed for wheezing.  . Multiple Vitamins-Minerals (HAIR/SKIN/NAILS PO) Take 1 tablet by mouth daily.  . nabumetone (RELAFEN) 750 MG tablet Take 1-2 tablets daily prn  . Omega-3 Fatty Acids (FISH OIL PO) Take 2,000 mg by mouth daily.  . pantoprazole (PROTONIX) 40 MG tablet take 1 tablet by mouth twice a day  . Polyethylene Glycol 3350 (MIRALAX PO) Take 8.5 g by mouth at bedtime.   . ramipril (ALTACE) 10 MG capsule take 1 capsule by mouth once daily  . spironolactone (ALDACTONE) 25 MG tablet take 1 tablet by mouth every morning  . sucralfate (CARAFATE) 1 G tablet Take 1 tablet (1 g total) by mouth 2 (two) times daily as needed.  . traZODone (DESYREL) 150 MG tablet Take 1 & 2/3-2 tablets QHS  . verapamil (COVERA HS) 180 MG (CO) 24 hr tablet Take 1 tablet (180 mg total) by mouth 2 (two) times daily.  .  vitamin B-12 (CYANOCOBALAMIN) 1000 MCG tablet Take 1,000 mcg by mouth daily.  . vitamin C (ASCORBIC ACID) 500 MG tablet Take 500 mg by mouth daily.  [name] ZETIA 10 MG tablet take 1 tablet by mouth once daily  . [  DISCONTINUED] nabumetone (RELAFEN) 750 MG tablet Take 1 tablet (750 mg total) by mouth daily.  . traMADol (ULTRAM) 50 MG tablet Take 1 tablet (50 mg total) by mouth daily as needed.   No facility-administered encounter medications on file as of 08/16/2015.    Review of Systems  Constitutional: Negative for appetite change and unexpected weight change.  HENT: Negative for congestion and sinus pressure.   Eyes: Negative for pain and visual disturbance.  Respiratory: Negative for cough, chest tightness and shortness of breath.   Cardiovascular: Negative for chest pain, palpitations and leg swelling.  Gastrointestinal: Negative for nausea, vomiting, abdominal pain and diarrhea.  Genitourinary: Negative for dysuria and difficulty urinating.  Musculoskeletal: Positive for back pain (chronic). Negative for joint swelling.  Skin: Negative for color change and rash.  Neurological: Negative for dizziness, light-headedness and headaches.  Hematological: Negative for adenopathy. Does not bruise/bleed easily.  Psychiatric/Behavioral: Negative for dysphoric mood and agitation.       Objective:     Blood pressure rechecked by me:  118/68  Physical Exam  Constitutional: She is oriented to person, place, and time. She appears well-developed and well-nourished. No distress.  HENT:  Nose: Nose normal.  Mouth/Throat: Oropharynx is clear and moist.  Eyes: Right eye exhibits no discharge. Left eye exhibits no discharge. No scleral icterus.  Neck: Neck supple. No thyromegaly present.  Cardiovascular: Normal rate and regular rhythm.   Pulmonary/Chest: Breath sounds normal. No accessory muscle usage. No tachypnea. No respiratory distress. She has no decreased breath sounds. She has no wheezes. She has  no rhonchi. Right breast exhibits no inverted nipple, no mass, no nipple discharge and no tenderness (no axillary adenopathy). Left breast exhibits no inverted nipple, no mass, no nipple discharge and no tenderness (no axilarry adenopathy).  Abdominal: Soft. Bowel sounds are normal. There is no tenderness.  Musculoskeletal: She exhibits no edema or tenderness.  Lymphadenopathy:    She has no cervical adenopathy.  Neurological: She is alert and oriented to person, place, and time.  Skin: Skin is warm. No rash noted. No erythema.  Psychiatric: She has a normal mood and affect. Her behavior is normal.    BP 118/60 mmHg  Pulse 62  Temp(Src) 97.7 F (36.5 C) (Oral)  Resp 18  Ht 5' 3.5" (1.613 m)  Wt 189 lb (85.73 kg)  BMI 32.95 kg/m2  SpO2 98% Wt Readings from Last 3 Encounters:  08/16/15 189 lb (85.73 kg)  06/14/15 188 lb 12.8 oz (85.639 kg)  05/19/15 188 lb 9.6 oz (85.548 kg)     Lab Results  Component Value Date   WBC 4.9 12/30/2014   HGB 12.6 12/30/2014   HCT 36.9 12/30/2014   PLT 226.0 12/30/2014   GLUCOSE 93 03/22/2015   CHOL 208* 03/21/2015   TRIG 92.0 03/21/2015   HDL 49.90 03/21/2015   LDLDIRECT 126.7 07/03/2013   LDLCALC 140* 03/21/2015   ALT 19 03/23/2015   AST 21 03/23/2015   NA 134* 03/22/2015   K 4.6 03/22/2015   CL 100 03/22/2015   CREATININE 0.89 03/22/2015   BUN 25* 03/22/2015   CO2 27 03/22/2015   TSH 0.73 06/15/2014       Assessment & Plan:   Problem List Items Addressed This Visit    CAD (coronary artery disease)    Stable.  Continue risk factor modification.        Depression    She is tapering her amitriptyline and trazodone.  Doing well.  Follow.  Relevant Orders   TSH   Essential hypertension, benign    Blood pressure under good control.  Continue same medication regimen.  Follow pressures.  Follow metabolic panel.        Relevant Orders   Basic metabolic panel   GERD (gastroesophageal reflux disease)    EGD 03/22/15 as  outlined in overview.  Upper symptoms controlled.  Follow.  On carafate.        Health care maintenance    Physical today 08/16/15.  Colonoscopy 05/03/15 - two 2-48mm polyps hepatic flexure and one 18mm polyp (sigmoid) and non bleeding internal hemorrhoids.  Mammogram 02/01/15 - Birads I.        History of colonic polyps    Colonoscopy 05/03/15.        Hypercholesterolemia    Has had problems with statin medications.  Low cholesterol diet and exercise.  Follow lipid panel.        Relevant Orders   Lipid panel   Hepatic function panel   Osteoarthritis    Needs a few more relafen to help her function some days.  Have discussed risk of persistent use of antiinflammatories.  Follow.        Relevant Medications   nabumetone (RELAFEN) 750 MG tablet   traMADol (ULTRAM) 50 MG tablet   Spinal stenosis    Chronic back pain.  Limits her activity.  She tries to stay active.  Uses a walker.  Desires no further intervention.  If desires intervention in the future reports wants to see dr Gloris Manchester [phone].         Other Visit Diagnoses    Routine general medical examination at a health care facility    -  Primary        [name], MD

## 2015-08-21 ENCOUNTER — Other Ambulatory Visit: Payer: Self-pay | Admitting: Internal Medicine

## 2015-08-21 ENCOUNTER — Encounter: Payer: Self-pay | Admitting: Internal Medicine

## 2015-08-21 NOTE — Assessment & Plan Note (Signed)
She is tapering her amitriptyline and trazodone.  Doing well.  Follow.

## 2015-08-21 NOTE — Assessment & Plan Note (Signed)
EGD 03/22/15 as outlined in overview.  Upper symptoms controlled.  Follow.  On carafate.

## 2015-08-21 NOTE — Assessment & Plan Note (Signed)
Chronic back pain.  Limits her activity.  She tries to stay active.  Uses a walker.  Desires no further intervention.  If desires intervention in the future reports wants to see dr Gloris Manchester [phone].

## 2015-08-21 NOTE — Assessment & Plan Note (Signed)
Blood pressure under good control.  Continue same medication regimen.  Follow pressures.  Follow metabolic panel.   

## 2015-08-21 NOTE — Assessment & Plan Note (Signed)
Has had problems with statin medications.  Low cholesterol diet and exercise.  Follow lipid panel.   

## 2015-08-21 NOTE — Assessment & Plan Note (Signed)
Needs a few more relafen to help her function some days.  Have discussed risk of persistent use of antiinflammatories.  Follow.

## 2015-08-21 NOTE — Assessment & Plan Note (Signed)
Stable. Continue risk factor modification.  

## 2015-08-21 NOTE — Assessment & Plan Note (Signed)
Physical today 08/16/15.  Colonoscopy 05/03/15 - two 2-55mm polyps hepatic flexure and one 82mm polyp (sigmoid) and non bleeding internal hemorrhoids.  Mammogram 02/01/15 - Birads I.

## 2015-08-21 NOTE — Assessment & Plan Note (Signed)
Colonoscopy 05/03/15.

## 2015-08-22 NOTE — Telephone Encounter (Signed)
Trazodone last refilled 04/21/15 for #60 with 2 refills. Ok to refill this medication?

## 2015-08-22 NOTE — Telephone Encounter (Signed)
ok'd refill for trazodone #60 with 2 refills.

## 2015-08-28 ENCOUNTER — Other Ambulatory Visit: Payer: Self-pay | Admitting: Internal Medicine

## 2015-08-29 ENCOUNTER — Other Ambulatory Visit: Payer: Self-pay | Admitting: Internal Medicine

## 2015-09-04 ENCOUNTER — Other Ambulatory Visit: Payer: Self-pay | Admitting: Internal Medicine

## 2015-09-10 ENCOUNTER — Other Ambulatory Visit: Payer: Self-pay | Admitting: Internal Medicine

## 2015-09-12 ENCOUNTER — Ambulatory Visit (INDEPENDENT_AMBULATORY_CARE_PROVIDER_SITE_OTHER): Payer: Medicare Other | Admitting: Nurse Practitioner

## 2015-09-12 ENCOUNTER — Encounter: Payer: Self-pay | Admitting: Nurse Practitioner

## 2015-09-12 VITALS — BP 124/68 | HR 69 | Temp 98.4°F | Resp 12 | Ht 63.5 in | Wt 184.6 lb

## 2015-09-12 DIAGNOSIS — J209 Acute bronchitis, unspecified: Secondary | ICD-10-CM | POA: Diagnosis not present

## 2015-09-12 DIAGNOSIS — I2510 Atherosclerotic heart disease of native coronary artery without angina pectoris: Secondary | ICD-10-CM | POA: Diagnosis not present

## 2015-09-12 DIAGNOSIS — E7800 Pure hypercholesterolemia, unspecified: Secondary | ICD-10-CM

## 2015-09-12 DIAGNOSIS — F32A Depression, unspecified: Secondary | ICD-10-CM

## 2015-09-12 DIAGNOSIS — I10 Essential (primary) hypertension: Secondary | ICD-10-CM | POA: Diagnosis not present

## 2015-09-12 DIAGNOSIS — F329 Major depressive disorder, single episode, unspecified: Secondary | ICD-10-CM

## 2015-09-12 LAB — LIPID PANEL
CHOLESTEROL: 204 mg/dL — AB (ref 0–200)
HDL: 49.7 mg/dL (ref >39.00)
LDL CALC: 137 mg/dL — AB (ref 0–99)
NonHDL: 154.41
Total CHOL/HDL Ratio: 4
Triglycerides: 86 mg/dL (ref 0.0–149.0)
VLDL: 17.2 mg/dL (ref 0.0–40.0)

## 2015-09-12 LAB — BASIC METABOLIC PANEL
BUN: 18 mg/dL (ref 6–23)
CALCIUM: 9.7 mg/dL (ref 8.4–10.5)
CO2: 26 mEq/L (ref 19–32)
CREATININE: 1 mg/dL (ref 0.40–1.20)
Chloride: 100 mEq/L (ref 96–112)
GFR: 57.08 mL/min — AB (ref >60.00)
Glucose, Bld: 88 mg/dL (ref 70–99)
Potassium: 4.8 mEq/L (ref 3.5–5.1)
SODIUM: 135 meq/L (ref 135–145)

## 2015-09-12 LAB — HEPATIC FUNCTION PANEL
ALBUMIN: 4.2 g/dL (ref 3.5–5.2)
ALT: 18 U/L (ref 0–35)
AST: 22 U/L (ref 0–37)
Alkaline Phosphatase: 65 U/L (ref 39–117)
Bilirubin, Direct: 0.1 mg/dL (ref 0.0–0.3)
TOTAL PROTEIN: 6.7 g/dL (ref 6.0–8.3)
Total Bilirubin: 0.5 mg/dL (ref 0.2–1.2)

## 2015-09-12 LAB — TSH: TSH: 1.54 u[IU]/mL (ref 0.35–4.50)

## 2015-09-12 MED ORDER — IPRATROPIUM BROMIDE HFA 17 MCG/ACT IN AERS: 2.0000 | INHALATION_SPRAY | Freq: Four times a day (QID) | RESPIRATORY_TRACT | Status: DC | PRN

## 2015-09-12 MED ORDER — FLUTICASONE-SALMETEROL 250-50 MCG/DOSE IN AEPB: 1.0000 | INHALATION_SPRAY | Freq: Two times a day (BID) | RESPIRATORY_TRACT | Status: DC

## 2015-09-12 MED ORDER — CLARITHROMYCIN 500 MG PO TABS: 500.0000 mg | ORAL_TABLET | Freq: Two times a day (BID) | ORAL | Status: DC

## 2015-09-12 NOTE — Progress Notes (Signed)
Patient ID: Charlotte Wagner, female    DOB: 1936/10/07  Age: 79 y.o. MRN: [id number]  CC: Bronchitis   HPI Charlotte Wagner presents for CC of bronchitis symptoms and refill of inhalers.   1) URI symptoms x 10 days. Cough w/ productive green mucous. Pt allergic to PCN, Sulfa, and tetracyclines.  Pt reports needing Advair and Atrovent refilled  Biaxin is her usual go to.  Pt has sick contacts at church Pt has tried various OTC measures with no sustained relief.  History Charlotte Wagner has a past medical history of Asthma; Arthritis; Depression; History of chicken pox; Cancer [facility]); GERD (gastroesophageal reflux disease); Allergy; Arrhythmia; Heart murmur; CAD (coronary artery disease); Hypertension; Hyperlipidemia; Chronic kidney disease; migraines; Colon polyps; Spinal stenosis; UTI (urinary tract infection); Urine incontinence; PUD (peptic ulcer disease); Lichen planus; Vitamin D deficiency; Raynaud's phenomenon; PMR (polymyalgia rheumatica) ([place]); and Melanoma ([place]).   She has past surgical history that includes Cholecystectomy (90's); Breast surgery (Right); Adenoidectomy; Coronary artery bypass graft (98); Knee arthroscopy w/ OATS procedure; Total hip arthroplasty; Joint replacement; Esophagogastroduodenoscopy (N/A, 03/22/2015); and Colonoscopy with propofol (N/A, 05/03/2015).   Her family history includes Alcohol abuse in her father; Depression in her daughter, father, maternal grandmother, and son; Heart disease in her father; Hypertension in her maternal grandfather and paternal grandfather; Lung cancer in her sister; Stroke in her maternal grandmother; Thyroid disease in her daughter and mother.She reports that she has quit smoking. She has never used smokeless tobacco. She reports that she does not drink alcohol or use illicit drugs.  Outpatient Prescriptions Prior to Visit  Medication Sig Dispense Refill  . acetaminophen (TYLENOL) 500 MG tablet Take 1,000 mg by mouth every 6 (six) hours as needed  for moderate pain or headache.    [name] amitriptyline (ELAVIL) 25 MG tablet take 1 to 2 tablets by mouth at bedtime 60 tablet 2  . b complex vitamins tablet Take 1 tablet by mouth daily.    . bisoprolol (ZEBETA) 5 MG tablet TAKE 1/2 TABLET BY MOUTH DAILY AS DIRECTED 30 tablet 3  . Cholecalciferol (VITAMIN D3) 2000 UNITS TABS Take 1 capsule by mouth daily.     . Coenzyme Q10 (COQ-10) 100 MG CAPS Take 1 tablet by mouth daily.    . fluticasone (FLOVENT HFA) 110 MCG/ACT inhaler Inhale 2 puffs into the lungs 2 (two) times daily. (Patient taking differently: Inhale 2 puffs into the lungs 2 (two) times daily as needed (SOB). ) 1 Inhaler 2  . folic acid (FOLVITE) 1 MG tablet Take 1 tablet (1 mg total) by mouth daily. 30 tablet 5  . hydrocortisone 2.5 % cream Apply 1 application topically 2 (two) times daily at 10 am and 4 pm. Apply to vulva 30 g 1  . ketoconazole (NIZORAL) 2 % cream Apply 1 application topically daily. 30 g 0  . levalbuterol (XOPENEX HFA) 45 MCG/ACT inhaler Inhale 2 puffs into the lungs every 6 (six) hours as needed for wheezing. 1 Inhaler 2  . Multiple Vitamins-Minerals (HAIR/SKIN/NAILS PO) Take 1 tablet by mouth daily.    . nabumetone (RELAFEN) 750 MG tablet Take 1-2 tablets daily prn 35 tablet 2  . Omega-3 Fatty Acids (FISH OIL PO) Take 2,000 mg by mouth daily.    . pantoprazole (PROTONIX) 40 MG tablet take 1 tablet by mouth twice a day 60 tablet 5  . Polyethylene Glycol 3350 (MIRALAX PO) Take 8.5 g by mouth at bedtime.     . ramipril (ALTACE) 10 MG capsule take 1 capsule  by mouth once daily 30 capsule 3  . spironolactone (ALDACTONE) 25 MG tablet take 1 tablet by mouth every morning 30 tablet 5  . sucralfate (CARAFATE) 1 G tablet Take 1 tablet (1 g total) by mouth 2 (two) times daily as needed. 30 tablet 0  . traMADol (ULTRAM) 50 MG tablet Take 1 tablet (50 mg total) by mouth daily as needed. 20 tablet 0  . traZODone (DESYREL) 150 MG tablet TAKE 1 AND 2/3 TABLETS TO 2 TABLETS AT BEDTIME  AS DIRECTED 60 tablet 2  . verapamil (CALAN-SR) 180 MG CR tablet take 1 tablet by mouth twice a day 60 tablet 5  . vitamin B-12 (CYANOCOBALAMIN) 1000 MCG tablet Take 1,000 mcg by mouth daily.    . vitamin C (ASCORBIC ACID) 500 MG tablet Take 500 mg by mouth daily.    [name] ZETIA 10 MG tablet take 1 tablet by mouth once daily 30 tablet 5  . clarithromycin (BIAXIN) 500 MG tablet Take 1 tablet (500 mg total) by mouth 2 (two) times daily. 14 tablet 0  . Fluticasone-Salmeterol (ADVAIR DISKUS) 250-50 MCG/DOSE AEPB Inhale 1 puff into the lungs 2 (two) times daily. 60 each 1  . ipratropium (ATROVENT HFA) 17 MCG/ACT inhaler Inhale 2 puffs into the lungs every 6 (six) hours as needed for wheezing. 1 Inhaler 2   No facility-administered medications prior to visit.    ROS Review of Systems  Constitutional: Negative for fever, chills, diaphoresis and fatigue.  HENT: Positive for congestion, postnasal drip and rhinorrhea. Negative for sore throat and tinnitus.   Eyes: Negative for visual disturbance.  Respiratory: Positive for cough. Negative for chest tightness, shortness of breath and wheezing.   Cardiovascular: Negative for chest pain, palpitations and leg swelling.  Skin: Negative for rash.    Objective:  BP 124/68 mmHg  Pulse 69  Temp(Src) 98.4 F (36.9 C)  Resp 12  Ht 5' 3.5" (1.613 m)  Wt 184 lb 9.6 oz (83.734 kg)  BMI 32.18 kg/m2  SpO2 95%  Physical Exam  Constitutional: She is oriented to person, place, and time. She appears well-developed and well-nourished. No distress.  HENT:  Head: Normocephalic and atraumatic.  Right Ear: External ear normal.  Left Ear: External ear normal.  Mouth/Throat: No oropharyngeal exudate.  Eyes: EOM are normal. Pupils are equal, round, and reactive to light. Right eye exhibits no discharge. Left eye exhibits no discharge. No scleral icterus.  Cardiovascular: Normal rate, regular rhythm and normal heart sounds.  Exam reveals no gallop and no friction rub.    No murmur heard. Pulmonary/Chest: Effort normal and breath sounds normal. No respiratory distress. She has no wheezes. She has no rales. She exhibits no tenderness.  Neurological: She is alert and oriented to person, place, and time. No cranial nerve deficit. She exhibits normal muscle tone. Coordination normal.  Skin: Skin is warm and dry. No rash noted. She is not diaphoretic.  Psychiatric: She has a normal mood and affect. Her behavior is normal. Judgment and thought content normal.   Assessment & Plan:   Jhoselin was seen today for bronchitis.  Diagnoses and all orders for this visit:  Acute bronchitis, unspecified organism  Hypercholesterolemia -     Lipid panel -     Hepatic function panel  Depression -     TSH  Essential hypertension, benign -     Basic metabolic panel  Other orders -     ipratropium (ATROVENT HFA) 17 MCG/ACT inhaler; Inhale 2 puffs into the lungs  every 6 (six) hours as needed for wheezing. -     Fluticasone-Salmeterol (ADVAIR DISKUS) 250-50 MCG/DOSE AEPB; Inhale 1 puff into the lungs 2 (two) times daily. -     clarithromycin (BIAXIN) 500 MG tablet; Take 1 tablet (500 mg total) by mouth 2 (two) times daily.   I am having Ms. Mey maintain her Polyethylene Glycol 3350 (MIRALAX PO), b complex vitamins, vitamin B-12, Vitamin D3, Omega-3 Fatty Acids (FISH OIL PO), vitamin C, ketoconazole, levalbuterol, fluticasone, hydrocortisone, acetaminophen, CoQ-10, Multiple Vitamins-Minerals (HAIR/SKIN/NAILS PO), sucralfate, folic acid, ZETIA, ramipril, spironolactone, nabumetone, traMADol, traZODone, pantoprazole, bisoprolol, amitriptyline, verapamil, ipratropium, Fluticasone-Salmeterol, and clarithromycin.  Meds ordered this encounter  Medications  . ipratropium (ATROVENT HFA) 17 MCG/ACT inhaler    Sig: Inhale 2 puffs into the lungs every 6 (six) hours as needed for wheezing.    Dispense:  1 Inhaler    Refill:  2    Order Specific Question:  Supervising Provider     Answer:  Derrel Nip, TERESA L [[id number]]  . Fluticasone-Salmeterol (ADVAIR DISKUS) 250-50 MCG/DOSE AEPB    Sig: Inhale 1 puff into the lungs 2 (two) times daily.    Dispense:  60 each    Refill:  1    Order Specific Question:  Supervising Provider    Answer:  Deborra Medina L [[id number]]  . clarithromycin (BIAXIN) 500 MG tablet    Sig: Take 1 tablet (500 mg total) by mouth 2 (two) times daily.    Dispense:  14 tablet    Refill:  0    Order Specific Question:  Supervising Provider    Answer:  Crecencio Mc [[id number]]     Follow-up: Return if symptoms worsen or fail to improve.

## 2015-09-12 NOTE — Patient Instructions (Addendum)
Cancel your appointment for labs on Thursday.   Biaxin twice daily- Take with Probiotics  OTC measures as needed  Call us if anything changes.

## 2015-09-12 NOTE — Progress Notes (Signed)
Pre visit review using our clinic review tool, if applicable. No additional management support is needed unless otherwise documented below in the visit note. 

## 2015-09-12 NOTE — Assessment & Plan Note (Signed)
Similar symptoms to last visit. Biaxin repeated for 7 days twice daily. Discussed possible drug interactions once again with her cholesterol medication. Refilled Advair and Atrovent- sent to pharmacy. FU prn worsening/failure to improve.  OTC measures for supportive care. Probiotics encouraged.  Comfortable with the plan.

## 2015-09-15 ENCOUNTER — Other Ambulatory Visit: Payer: Medicare Other

## 2015-10-07 ENCOUNTER — Other Ambulatory Visit: Payer: Self-pay | Admitting: Internal Medicine

## 2015-10-11 ENCOUNTER — Telehealth: Payer: Self-pay | Admitting: *Deleted

## 2015-10-11 NOTE — Telephone Encounter (Signed)
Patient has requested a copy of her lab results be mailed out to her from 09/12/15.

## 2015-10-11 NOTE — Telephone Encounter (Signed)
Mailed labs to patient as requested.

## 2015-11-10 ENCOUNTER — Ambulatory Visit (INDEPENDENT_AMBULATORY_CARE_PROVIDER_SITE_OTHER): Payer: Medicare Other | Admitting: Family Medicine

## 2015-11-10 VITALS — BP 136/84 | HR 71 | Temp 97.8°F | Ht 63.5 in | Wt 186.4 lb

## 2015-11-10 DIAGNOSIS — J209 Acute bronchitis, unspecified: Secondary | ICD-10-CM

## 2015-11-10 MED ORDER — CLARITHROMYCIN 500 MG PO TABS: 500.0000 mg | ORAL_TABLET | Freq: Two times a day (BID) | ORAL | Status: DC

## 2015-11-10 MED ORDER — IPRATROPIUM BROMIDE HFA 17 MCG/ACT IN AERS: 2.0000 | INHALATION_SPRAY | Freq: Four times a day (QID) | RESPIRATORY_TRACT | Status: DC | PRN

## 2015-11-10 MED ORDER — FLUTICASONE-SALMETEROL 250-50 MCG/DOSE IN AEPB: 1.0000 | INHALATION_SPRAY | Freq: Two times a day (BID) | RESPIRATORY_TRACT | Status: DC

## 2015-11-10 NOTE — Progress Notes (Signed)
Pre visit review using our clinic review tool, if applicable. No additional management support is needed unless otherwise documented below in the visit note. 

## 2015-11-10 NOTE — Progress Notes (Signed)
Patient ID: Charlotte Wagner, female   DOB: June 27, 1936, 80 y.o.   MRN: [id number]  Tommi Rumps, MD Phone: [phone]  Charlotte Wagner is a 80 y.o. 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  Bronchitis: patient notes 1.5 weeks of cough productive of green sputum. Notes no fevers. Notes minimal shortness of breath that is near her baseline. No chest pain. No fevers. Has a history of chronic bronchitis. Smoked for about 10 years. She's been using Atrovent and Advair. Notes the congestion is in her chest. Did have some postnasal drip, though this resolved. She notes in the past she has received Biaxin and this resolves her symptoms. Sick contacts with her grandchildren.  PMH: Former smoker   ROS see history of present illness  Objective  Physical Exam Filed Vitals:   11/10/15 1357  BP: 136/84  Pulse: 71  Temp: 97.8 F (36.6 C)  Physical Exam  Constitutional: She is well-developed, well-nourished, and in no distress.  HENT:  Head: Normocephalic and atraumatic.  Right Ear: External ear normal.  Left Ear: External ear normal.  Mouth/Throat: Oropharynx is clear and moist. No oropharyngeal exudate.  Eyes: Conjunctivae are normal. Pupils are equal, round, and reactive to light.  Neck: Neck supple.  Cardiovascular: Normal rate, regular rhythm and normal heart sounds.  Exam reveals no gallop and no friction rub.   No murmur heard. Pulmonary/Chest: Effort normal. No respiratory distress.  Coarse breath sounds throughout, no focal crackles, no wheezes  Lymphadenopathy:    She has no cervical adenopathy.  Neurological: She is alert. Gait normal.  Skin: Skin is warm and dry. She is not diaphoretic.     Assessment/Plan: Please see individual problem list.  Acute bronchitis Similar symptoms to prior episodes of bronchitis. Has responded to Biaxin in the past. Advised of possible drug interactions. Refilled Advair and Atrovent. Stay well hydrated.  Biaxin for bronchitis. Discussed chest x-ray with patient and we both felt that waiting to see if she responded to antibiotics would be the preferred route. Given return precautions.    Meds ordered this encounter  Medications  . ipratropium (ATROVENT HFA) 17 MCG/ACT inhaler    Sig: Inhale 2 puffs into the lungs every 6 (six) hours as needed for wheezing.    Dispense:  1 Inhaler    Refill:  2  . Fluticasone-Salmeterol (ADVAIR DISKUS) 250-50 MCG/DOSE AEPB    Sig: Inhale 1 puff into the lungs 2 (two) times daily.    Dispense:  60 each    Refill:  1  . clarithromycin (BIAXIN) 500 MG tablet    Sig: Take 1 tablet (500 mg total) by mouth 2 (two) times daily.    Dispense:  14 tablet    Refill:  0     Tommi Rumps

## 2015-11-10 NOTE — Assessment & Plan Note (Signed)
Similar symptoms to prior episodes of bronchitis. Has responded to Biaxin in the past. Advised of possible drug interactions. Refilled Advair and Atrovent. Stay well hydrated. Biaxin for bronchitis. Discussed chest x-ray with patient and we both felt that waiting to see if she responded to antibiotics would be the preferred route. Given return precautions.

## 2015-11-10 NOTE — Patient Instructions (Signed)
Nice to meet you. You have bronchitis. We will treat this with biaxin. I have provided refills on advair and atrovent.  If you develop chest pain, shortness of breath, cough productive of blood, fever, or any new or change in symptoms please seek medical attention.

## 2015-11-11 ENCOUNTER — Encounter: Payer: Self-pay | Admitting: Family Medicine

## 2015-11-16 ENCOUNTER — Other Ambulatory Visit: Payer: Self-pay | Admitting: Internal Medicine

## 2015-12-04 ENCOUNTER — Other Ambulatory Visit: Payer: Self-pay | Admitting: Internal Medicine

## 2015-12-10 ENCOUNTER — Other Ambulatory Visit: Payer: Self-pay | Admitting: Internal Medicine

## 2015-12-12 NOTE — Telephone Encounter (Signed)
Okay to refill? Last OV:11/8 & Future OV: 5/18

## 2015-12-13 NOTE — Telephone Encounter (Signed)
ok'd refill for relafen #35 with 1 refill.

## 2015-12-18 ENCOUNTER — Other Ambulatory Visit: Payer: Self-pay | Admitting: Internal Medicine

## 2015-12-21 DIAGNOSIS — H2513 Age-related nuclear cataract, bilateral: Secondary | ICD-10-CM | POA: Diagnosis not present

## 2015-12-23 ENCOUNTER — Telehealth: Payer: Self-pay | Admitting: Internal Medicine

## 2015-12-23 DIAGNOSIS — H2513 Age-related nuclear cataract, bilateral: Secondary | ICD-10-CM | POA: Diagnosis not present

## 2015-12-23 NOTE — Telephone Encounter (Signed)
Charlotte Wagner have you started a pa on this pt?

## 2015-12-23 NOTE — Telephone Encounter (Signed)
Pt needs pre-authorization for insurance on her nabumetone (RELAFEN) 750 MG tablet.

## 2015-12-26 NOTE — Telephone Encounter (Signed)
I have not seen one yet or started it.  Is it in the paperwork today?

## 2015-12-26 NOTE — Telephone Encounter (Signed)
Having the pharmacy fax over PA for med

## 2015-12-27 NOTE — Telephone Encounter (Signed)
Please advise, thanks.

## 2015-12-27 NOTE — Telephone Encounter (Signed)
PA given to [facility]

## 2015-12-27 NOTE — Telephone Encounter (Signed)
Charlotte Wagner aware and will follow up at her return tomorrow.

## 2015-12-28 DIAGNOSIS — H2513 Age-related nuclear cataract, bilateral: Secondary | ICD-10-CM | POA: Diagnosis not present

## 2015-12-28 NOTE — Telephone Encounter (Signed)
PA submitted on covermymeds. Awaiting response.

## 2015-12-30 ENCOUNTER — Encounter: Payer: Self-pay | Admitting: *Deleted

## 2016-01-03 ENCOUNTER — Other Ambulatory Visit: Payer: Self-pay | Admitting: Internal Medicine

## 2016-01-03 NOTE — Discharge Instructions (Signed)

## 2016-01-04 ENCOUNTER — Encounter
Admission: RE | Disposition: A | Discharge status: Home / Self Care | Payer: Self-pay | Source: Ambulatory Visit | Attending: Ophthalmology

## 2016-01-04 ENCOUNTER — Ambulatory Visit
Admission: RE | Admit: 2016-01-04 | Discharge: 2016-01-04 | Disposition: A | Discharge status: Home / Self Care | Payer: Medicare Other | Source: Ambulatory Visit | Attending: Ophthalmology | Admitting: Ophthalmology

## 2016-01-04 ENCOUNTER — Ambulatory Visit: Payer: Medicare Other | Admitting: Student in an Organized Health Care Education/Training Program

## 2016-01-04 DIAGNOSIS — H57051 Tonic pupil, right eye: Secondary | ICD-10-CM | POA: Insufficient documentation

## 2016-01-04 DIAGNOSIS — Z87891 Personal history of nicotine dependence: Secondary | ICD-10-CM | POA: Diagnosis not present

## 2016-01-04 DIAGNOSIS — H2513 Age-related nuclear cataract, bilateral: Secondary | ICD-10-CM | POA: Diagnosis not present

## 2016-01-04 DIAGNOSIS — I2510 Atherosclerotic heart disease of native coronary artery without angina pectoris: Secondary | ICD-10-CM | POA: Insufficient documentation

## 2016-01-04 DIAGNOSIS — J449 Chronic obstructive pulmonary disease, unspecified: Secondary | ICD-10-CM | POA: Diagnosis not present

## 2016-01-04 DIAGNOSIS — Z79899 Other long term (current) drug therapy: Secondary | ICD-10-CM | POA: Insufficient documentation

## 2016-01-04 DIAGNOSIS — K219 Gastro-esophageal reflux disease without esophagitis: Secondary | ICD-10-CM | POA: Diagnosis not present

## 2016-01-04 DIAGNOSIS — Z886 Allergy status to analgesic agent status: Secondary | ICD-10-CM | POA: Diagnosis not present

## 2016-01-04 DIAGNOSIS — Z9049 Acquired absence of other specified parts of digestive tract: Secondary | ICD-10-CM | POA: Diagnosis not present

## 2016-01-04 DIAGNOSIS — H269 Unspecified cataract: Secondary | ICD-10-CM | POA: Diagnosis present

## 2016-01-04 DIAGNOSIS — Z8711 Personal history of peptic ulcer disease: Secondary | ICD-10-CM | POA: Diagnosis not present

## 2016-01-04 DIAGNOSIS — H2511 Age-related nuclear cataract, right eye: Secondary | ICD-10-CM | POA: Insufficient documentation

## 2016-01-04 DIAGNOSIS — Z951 Presence of aortocoronary bypass graft: Secondary | ICD-10-CM | POA: Insufficient documentation

## 2016-01-04 DIAGNOSIS — Z96653 Presence of artificial knee joint, bilateral: Secondary | ICD-10-CM | POA: Insufficient documentation

## 2016-01-04 DIAGNOSIS — Z888 Allergy status to other drugs, medicaments and biological substances status: Secondary | ICD-10-CM | POA: Diagnosis not present

## 2016-01-04 DIAGNOSIS — Z880 Allergy status to penicillin: Secondary | ICD-10-CM | POA: Diagnosis not present

## 2016-01-04 DIAGNOSIS — Z882 Allergy status to sulfonamides status: Secondary | ICD-10-CM | POA: Insufficient documentation

## 2016-01-04 DIAGNOSIS — I10 Essential (primary) hypertension: Secondary | ICD-10-CM | POA: Insufficient documentation

## 2016-01-04 DIAGNOSIS — F329 Major depressive disorder, single episode, unspecified: Secondary | ICD-10-CM | POA: Insufficient documentation

## 2016-01-04 HISTORY — DX: Personal history of other diseases of the digestive system: Z87.19

## 2016-01-04 HISTORY — PX: CATARACT EXTRACTION W/PHACO: SHX586

## 2016-01-04 HISTORY — DX: Headache, unspecified: R51.9

## 2016-01-04 HISTORY — DX: Headache: R51

## 2016-01-04 SURGERY — PHACOEMULSIFICATION, CATARACT, WITH IOL INSERTION
Anesthesia: Monitor Anesthesia Care | Laterality: Right | Wound class: Clean

## 2016-01-04 MED ORDER — LIDOCAINE HCL (PF) 4 % IJ SOLN
INTRAOCULAR | Status: DC | PRN
  Administered 2016-01-04: 1 mL via OPHTHALMIC

## 2016-01-04 MED ORDER — MIDAZOLAM HCL 2 MG/2ML IJ SOLN
INTRAMUSCULAR | Status: DC | PRN
  Administered 2016-01-04: 2 mg via INTRAVENOUS

## 2016-01-04 MED ORDER — CEFUROXIME OPHTHALMIC INJECTION 1 MG/0.1 ML
INJECTION | OPHTHALMIC | Status: DC | PRN
  Administered 2016-01-04: 0.1 mL via OPHTHALMIC

## 2016-01-04 MED ORDER — ARMC OPHTHALMIC DILATING GEL
1.0000 "application " | OPHTHALMIC | Status: DC | PRN
  Administered 2016-01-04 (×2): 1 via OPHTHALMIC

## 2016-01-04 MED ORDER — EPINEPHRINE HCL 1 MG/ML IJ SOLN
INTRAOCULAR | Status: DC | PRN
  Administered 2016-01-04: 54 mL via OPHTHALMIC

## 2016-01-04 MED ORDER — FENTANYL CITRATE (PF) 100 MCG/2ML IJ SOLN
INTRAMUSCULAR | Status: DC | PRN
  Administered 2016-01-04: 50 ug via INTRAVENOUS

## 2016-01-04 MED ORDER — POVIDONE-IODINE 5 % OP SOLN
1.0000 "application " | OPHTHALMIC | Status: DC | PRN
  Administered 2016-01-04: 1 via OPHTHALMIC

## 2016-01-04 MED ORDER — NA HYALUR & NA CHOND-NA HYALUR 0.4-0.35 ML IO KIT
PACK | INTRAOCULAR | Status: DC | PRN
  Administered 2016-01-04: 1 mL via INTRAOCULAR

## 2016-01-04 MED ORDER — TIMOLOL MALEATE 0.5 % OP SOLN
OPHTHALMIC | Status: DC | PRN
  Administered 2016-01-04: 1 [drp] via OPHTHALMIC

## 2016-01-04 MED ORDER — TETRACAINE HCL 0.5 % OP SOLN
1.0000 [drp] | OPHTHALMIC | Status: DC | PRN
  Administered 2016-01-04: 1 [drp] via OPHTHALMIC

## 2016-01-04 SURGICAL SUPPLY — 26 items
CANNULA ANT/CHMB 27GA (MISCELLANEOUS) ×3 IMPLANT
CARTRIDGE ABBOTT (MISCELLANEOUS) ×3 IMPLANT
GLOVE SURG LX 7.5 STRW (GLOVE) ×2
GLOVE SURG LX STRL 7.5 STRW (GLOVE) ×1 IMPLANT
GLOVE SURG TRIUMPH 8.0 PF LTX (GLOVE) ×3 IMPLANT
GOWN STRL REUS W/ TWL LRG LVL3 (GOWN DISPOSABLE) ×2 IMPLANT
GOWN STRL REUS W/TWL LRG LVL3 (GOWN DISPOSABLE) ×4
LENS IOL TECNIS ITEC 20.5 (Intraocular Lens) ×3 IMPLANT
MARKER SKIN DUAL TIP RULER LAB (MISCELLANEOUS) ×3 IMPLANT
NDL RETROBULBAR .5 NSTRL (NEEDLE) IMPLANT
NEEDLE FILTER BLUNT 18X 1/2SAF (NEEDLE) ×2
NEEDLE FILTER BLUNT 18X1 1/2 (NEEDLE) ×1 IMPLANT
PACK CATARACT BRASINGTON (MISCELLANEOUS) ×3 IMPLANT
PACK EYE AFTER SURG (MISCELLANEOUS) ×3 IMPLANT
PACK OPTHALMIC (MISCELLANEOUS) ×3 IMPLANT
RING MALYGIN 7.0 (MISCELLANEOUS) ×3 IMPLANT
SUT ETHILON 10-0 CS-B-6CS-B-6 (SUTURE)
SUT VICRYL  9 0 (SUTURE)
SUT VICRYL 9 0 (SUTURE) IMPLANT
SUTURE EHLN 10-0 CS-B-6CS-B-6 (SUTURE) IMPLANT
SYR 3ML LL SCALE MARK (SYRINGE) ×3 IMPLANT
SYR 5ML LL (SYRINGE) IMPLANT
SYR TB 1ML LUER SLIP (SYRINGE) ×3 IMPLANT
WATER STERILE IRR 250ML POUR (IV SOLUTION) ×3 IMPLANT
WATER STERILE IRR 500ML POUR (IV SOLUTION) IMPLANT
WIPE NON LINTING 3.25X3.25 (MISCELLANEOUS) ×3 IMPLANT

## 2016-01-04 NOTE — Anesthesia Preprocedure Evaluation (Addendum)
Anesthesia Evaluation  Patient identified by MRN, date of birth, ID band Patient awake    Reviewed: Allergy & Precautions, NPO status , Patient's Chart, lab work & pertinent test results, reviewed documented beta blocker date and time   Airway Mallampati: II  TM Distance: >3 FB Neck ROM: Full    Dental no notable dental hx.    Pulmonary asthma , former smoker,    Pulmonary exam normal        Cardiovascular hypertension, + CAD and + CABG (3v in 1998)  Normal cardiovascular exam+ dysrhythmias Supra Ventricular Tachycardia + Valvular Problems/Murmurs      Neuro/Psych  Headaches, Depression  Neuromuscular disease    GI/Hepatic hiatal hernia, GERD  Medicated and Controlled,  Endo/Other  PMR  Renal/GU CRFRenal disease     Musculoskeletal  (+) Arthritis , Osteoarthritis,    Abdominal   Peds  Hematology negative hematology ROS (+)   Anesthesia Other Findings   Reproductive/Obstetrics                            Anesthesia Physical Anesthesia Plan  ASA: III  Anesthesia Plan: MAC   Post-op Pain Management:    Induction: Intravenous  Airway Management Planned:   Additional Equipment:   Intra-op Plan:   Post-operative Plan:   Informed Consent: I have reviewed the patients History and Physical, chart, labs and discussed the procedure including the risks, benefits and alternatives for the proposed anesthesia with the patient or authorized representative who has indicated his/her understanding and acceptance.     Plan Discussed with: CRNA  Anesthesia Plan Comments:         Anesthesia Quick Evaluation

## 2016-01-04 NOTE — Anesthesia Procedure Notes (Signed)
Procedure Name: MAC Performed by: Paizley Ramella Pre-anesthesia Checklist: Patient identified, Emergency Drugs available, Suction available, Timeout performed and Patient being monitored Patient Re-evaluated:Patient Re-evaluated prior to inductionOxygen Delivery Method: Nasal cannula Placement Confirmation: positive ETCO2       

## 2016-01-04 NOTE — H&P (Signed)
  The History and Physical notes are on paper, have been signed, and are to be scanned. The patient remains stable and unchanged from the H&P.   Previous H&P reviewed, patient examined, and there are no changes.  Charlotte Wagner 01/04/2016 9:55 AM

## 2016-01-04 NOTE — Anesthesia Postprocedure Evaluation (Signed)
Anesthesia Post Note  Patient: Charlotte Wagner  Procedure(s) Performed: Procedure(s) (LRB): CATARACT EXTRACTION PHACO AND INTRAOCULAR LENS PLACEMENT (IOC) (Right)  Patient location during evaluation: PACU Anesthesia Type: MAC Level of consciousness: awake and alert and oriented Pain management: pain level controlled Vital Signs Assessment: post-procedure vital signs reviewed and stable Respiratory status: spontaneous breathing and nonlabored ventilation Cardiovascular status: stable Postop Assessment: no signs of nausea or vomiting and adequate PO intake Anesthetic complications: no    Estill Batten

## 2016-01-04 NOTE — Op Note (Signed)
OPERATIVE NOTE  FOUA MANTZ [id number] 01/04/2016   PREOPERATIVE DIAGNOSIS:    Nuclear Sclerotic Cataract Right eye with miotic pupil.        H25.11  POSTOPERATIVE DIAGNOSIS: Nuclear Sclerotic Cataract Right eye with miotic pupil.          PROCEDURE:  Phacoemusification with posterior chamber intraocular lens placement of the right eye which required pupil stretching with the Malyugin pupil expansion device.  LENS:   Implant Name Type Inv. Item Serial No. Manufacturer Lot No. LRB No. Used  PCB00 20.5D lens     [id number] [employer]   Right 1       ULTRASOUND TIME: 19 % of 1 minutes 10 seconds, CDE 13.0  SURGEON:  Wyonia Hough, MD   ANESTHESIA:  Topical with tetracaine drops and 2% Xylocaine jelly, augmented with 1% preservative-free intracameral lidocaine.   COMPLICATIONS:  None.   DESCRIPTION OF PROCEDURE:  The patient was identified in the holding room and transported to the operating room and placed in the supine position under the operating microscope. Theright eye was identified as the operative eye and it was prepped and draped in the usual sterile ophthalmic fashion.   A 1 millimeter clear-corneal paracentesis was made at the 12:00 position.  0.5 ml of preservative-free 1% lidocaine was injected into the anterior chamber. The anterior chamber was filled with Viscoat viscoelastic.  A 2.4 millimeter keratome was used to make a near-clear corneal incision at the 9:00 position. A Malyugin pupil expander was then placed through the main incision and into the anterior chamber of the eye.  The edge of the iris was secured on the lip of the pupil expander and it was released, thereby expanding the pupil to approximately 8 millimeters for completion of the cataract surgery.  Additional Viscoat was placed in the anterior chamber.  A cystotome and capsulorrhexis forceps were used to make a curvilinear capsulorrhexis.   Balanced salt solution was used to hydrodissect and  hydrodelineate the lens nucleus.   Phacoemulsification was used in stop and chop fashion to remove the lens, nucleus and epinucleus.  The remaining cortex was aspirated using the irrigation aspiration handpiece.  Additional Provisc was placed into the eye to distend the capsular bag for lens placement.  A lens was then injected into the capsular bag.  The pupil expanding ring was removed using a Kuglen hook and insertion device. The remaining viscoelastic was aspirated from the capsular bag and the anterior chamber.  The anterior chamber was filled with balanced salt solution to inflate to a physiologic pressure.  Wounds were hydrated with balanced salt solution.  The anterior chamber was inflated to a physiologic pressure with balanced salt solution.  No wound leaks were noted.Cefuroxime 0.1 ml of a 10mg /ml solution was injected into the anterior chamber for a dose of 1 mg of intracameral antibiotic at the completion of the case. Timolol and Brimonidine drops were applied to the eye.  The patient was taken to the recovery room in stable condition without complications of anesthesia or surgery.  Tayron Hunnell 01/04/2016, 10:23 AM

## 2016-01-04 NOTE — Transfer of Care (Signed)
Immediate Anesthesia Transfer of Care Note  Patient: Charlotte Wagner  Procedure(s) Performed: Procedure(s) with comments: CATARACT EXTRACTION PHACO AND INTRAOCULAR LENS PLACEMENT ([place]) (Right) - MALYUGIN  Patient Location: PACU  Anesthesia Type: MAC  Level of Consciousness: awake, alert  and patient cooperative  Airway and Oxygen Therapy: Patient Spontanous Breathing and Patient connected to supplemental oxygen  Post-op Assessment: Post-op Vital signs reviewed, Patient's Cardiovascular Status Stable, Respiratory Function Stable, Patent Airway and No signs of Nausea or vomiting  Post-op Vital Signs: Reviewed and stable  Complications: No apparent anesthesia complications

## 2016-01-05 ENCOUNTER — Encounter: Payer: Self-pay | Admitting: Ophthalmology

## 2016-01-05 NOTE — Telephone Encounter (Signed)
PA approved expires on 12/27/16. Faxed approval to pharmacy

## 2016-01-17 ENCOUNTER — Encounter: Payer: Self-pay | Admitting: *Deleted

## 2016-01-18 DIAGNOSIS — H2512 Age-related nuclear cataract, left eye: Secondary | ICD-10-CM | POA: Diagnosis not present

## 2016-01-24 NOTE — Discharge Instructions (Signed)

## 2016-01-25 ENCOUNTER — Ambulatory Visit
Admission: RE | Admit: 2016-01-25 | Discharge: 2016-01-25 | Disposition: A | Discharge status: Home / Self Care | Payer: Medicare Other | Source: Ambulatory Visit | Attending: Ophthalmology | Admitting: Ophthalmology

## 2016-01-25 ENCOUNTER — Encounter
Admission: RE | Disposition: A | Discharge status: Home / Self Care | Payer: Self-pay | Source: Ambulatory Visit | Attending: Ophthalmology

## 2016-01-25 ENCOUNTER — Ambulatory Visit: Payer: Medicare Other | Admitting: Anesthesiology

## 2016-01-25 DIAGNOSIS — I2510 Atherosclerotic heart disease of native coronary artery without angina pectoris: Secondary | ICD-10-CM | POA: Diagnosis not present

## 2016-01-25 DIAGNOSIS — Z880 Allergy status to penicillin: Secondary | ICD-10-CM | POA: Diagnosis not present

## 2016-01-25 DIAGNOSIS — Z886 Allergy status to analgesic agent status: Secondary | ICD-10-CM | POA: Diagnosis not present

## 2016-01-25 DIAGNOSIS — Z791 Long term (current) use of non-steroidal anti-inflammatories (NSAID): Secondary | ICD-10-CM | POA: Diagnosis not present

## 2016-01-25 DIAGNOSIS — Z96642 Presence of left artificial hip joint: Secondary | ICD-10-CM | POA: Diagnosis not present

## 2016-01-25 DIAGNOSIS — I10 Essential (primary) hypertension: Secondary | ICD-10-CM | POA: Diagnosis not present

## 2016-01-25 DIAGNOSIS — Z87891 Personal history of nicotine dependence: Secondary | ICD-10-CM | POA: Diagnosis not present

## 2016-01-25 DIAGNOSIS — Z882 Allergy status to sulfonamides status: Secondary | ICD-10-CM | POA: Insufficient documentation

## 2016-01-25 DIAGNOSIS — Z951 Presence of aortocoronary bypass graft: Secondary | ICD-10-CM | POA: Insufficient documentation

## 2016-01-25 DIAGNOSIS — H5703 Miosis: Secondary | ICD-10-CM | POA: Insufficient documentation

## 2016-01-25 DIAGNOSIS — H269 Unspecified cataract: Secondary | ICD-10-CM | POA: Diagnosis present

## 2016-01-25 DIAGNOSIS — E7800 Pure hypercholesterolemia, unspecified: Secondary | ICD-10-CM | POA: Diagnosis not present

## 2016-01-25 DIAGNOSIS — Z96653 Presence of artificial knee joint, bilateral: Secondary | ICD-10-CM | POA: Diagnosis not present

## 2016-01-25 DIAGNOSIS — J449 Chronic obstructive pulmonary disease, unspecified: Secondary | ICD-10-CM | POA: Diagnosis not present

## 2016-01-25 DIAGNOSIS — F329 Major depressive disorder, single episode, unspecified: Secondary | ICD-10-CM | POA: Diagnosis not present

## 2016-01-25 DIAGNOSIS — Z79899 Other long term (current) drug therapy: Secondary | ICD-10-CM | POA: Diagnosis not present

## 2016-01-25 DIAGNOSIS — Z85828 Personal history of other malignant neoplasm of skin: Secondary | ICD-10-CM | POA: Insufficient documentation

## 2016-01-25 DIAGNOSIS — Z888 Allergy status to other drugs, medicaments and biological substances status: Secondary | ICD-10-CM | POA: Diagnosis not present

## 2016-01-25 DIAGNOSIS — Z981 Arthrodesis status: Secondary | ICD-10-CM | POA: Diagnosis not present

## 2016-01-25 DIAGNOSIS — K219 Gastro-esophageal reflux disease without esophagitis: Secondary | ICD-10-CM | POA: Diagnosis not present

## 2016-01-25 DIAGNOSIS — Z881 Allergy status to other antibiotic agents status: Secondary | ICD-10-CM | POA: Insufficient documentation

## 2016-01-25 DIAGNOSIS — M1990 Unspecified osteoarthritis, unspecified site: Secondary | ICD-10-CM | POA: Diagnosis not present

## 2016-01-25 DIAGNOSIS — H2512 Age-related nuclear cataract, left eye: Secondary | ICD-10-CM | POA: Insufficient documentation

## 2016-01-25 HISTORY — PX: CATARACT EXTRACTION W/PHACO: SHX586

## 2016-01-25 SURGERY — PHACOEMULSIFICATION, CATARACT, WITH IOL INSERTION
Anesthesia: Monitor Anesthesia Care | Site: Eye | Laterality: Left | Wound class: Clean

## 2016-01-25 MED ORDER — NA HYALUR & NA CHOND-NA HYALUR 0.4-0.35 ML IO KIT
PACK | INTRAOCULAR | Status: DC | PRN
  Administered 2016-01-25: 1 mL via INTRAOCULAR

## 2016-01-25 MED ORDER — TIMOLOL MALEATE 0.5 % OP SOLN
OPHTHALMIC | Status: DC | PRN
  Administered 2016-01-25: 1 [drp] via OPHTHALMIC

## 2016-01-25 MED ORDER — TETRACAINE HCL 0.5 % OP SOLN
1.0000 [drp] | OPHTHALMIC | Status: DC | PRN
  Administered 2016-01-25: 1 [drp] via OPHTHALMIC

## 2016-01-25 MED ORDER — MIDAZOLAM HCL 2 MG/2ML IJ SOLN
INTRAMUSCULAR | Status: DC | PRN
  Administered 2016-01-25: 2 mg via INTRAVENOUS

## 2016-01-25 MED ORDER — CEFUROXIME OPHTHALMIC INJECTION 1 MG/0.1 ML
INJECTION | OPHTHALMIC | Status: DC | PRN
  Administered 2016-01-25: 0.1 mL via INTRACAMERAL

## 2016-01-25 MED ORDER — BSS IO SOLN
INTRAOCULAR | Status: DC | PRN
  Administered 2016-01-25: 61 mL via OPHTHALMIC

## 2016-01-25 MED ORDER — FENTANYL CITRATE (PF) 100 MCG/2ML IJ SOLN
INTRAMUSCULAR | Status: DC | PRN
Start: 2016-01-25 — End: 2016-01-25
  Administered 2016-01-25: 50 ug via INTRAVENOUS

## 2016-01-25 MED ORDER — BALANCED SALT IO SOLN
INTRAOCULAR | Status: DC | PRN
  Administered 2016-01-25: 1 mL via OPHTHALMIC

## 2016-01-25 MED ORDER — POVIDONE-IODINE 5 % OP SOLN
1.0000 "application " | OPHTHALMIC | Status: DC | PRN
  Administered 2016-01-25: 1 via OPHTHALMIC

## 2016-01-25 MED ORDER — ARMC OPHTHALMIC DILATING GEL
1.0000 | OPHTHALMIC | Status: DC | PRN
Start: 2016-01-25 — End: 2016-01-25
  Administered 2016-01-25 (×2): 1 via OPHTHALMIC

## 2016-01-25 MED ORDER — BRIMONIDINE TARTRATE 0.2 % OP SOLN
OPHTHALMIC | Status: DC | PRN
  Administered 2016-01-25: 1 [drp] via OPHTHALMIC

## 2016-01-25 SURGICAL SUPPLY — 26 items
CANNULA ANT/CHMB 27GA (MISCELLANEOUS) ×3 IMPLANT
CARTRIDGE ABBOTT (MISCELLANEOUS) ×3 IMPLANT
GLOVE SURG LX 7.5 STRW (GLOVE) ×2
GLOVE SURG LX STRL 7.5 STRW (GLOVE) ×1 IMPLANT
GLOVE SURG TRIUMPH 8.0 PF LTX (GLOVE) ×3 IMPLANT
GOWN STRL REUS W/ TWL LRG LVL3 (GOWN DISPOSABLE) ×2 IMPLANT
GOWN STRL REUS W/TWL LRG LVL3 (GOWN DISPOSABLE) ×4
LENS IOL TECNIS ITEC 20.5 (Intraocular Lens) ×3 IMPLANT
MARKER SKIN DUAL TIP RULER LAB (MISCELLANEOUS) ×3 IMPLANT
NDL RETROBULBAR .5 NSTRL (NEEDLE) IMPLANT
NEEDLE FILTER BLUNT 18X 1/2SAF (NEEDLE) ×2
NEEDLE FILTER BLUNT 18X1 1/2 (NEEDLE) ×1 IMPLANT
PACK CATARACT BRASINGTON (MISCELLANEOUS) ×3 IMPLANT
PACK EYE AFTER SURG (MISCELLANEOUS) ×3 IMPLANT
PACK OPTHALMIC (MISCELLANEOUS) ×3 IMPLANT
RING MALYGIN 7.0 (MISCELLANEOUS) IMPLANT
SUT ETHILON 10-0 CS-B-6CS-B-6 (SUTURE)
SUT VICRYL  9 0 (SUTURE)
SUT VICRYL 9 0 (SUTURE) IMPLANT
SUTURE EHLN 10-0 CS-B-6CS-B-6 (SUTURE) IMPLANT
SYR 3ML LL SCALE MARK (SYRINGE) ×3 IMPLANT
SYR 5ML LL (SYRINGE) IMPLANT
SYR TB 1ML LUER SLIP (SYRINGE) ×3 IMPLANT
WATER STERILE IRR 250ML POUR (IV SOLUTION) ×3 IMPLANT
WATER STERILE IRR 500ML POUR (IV SOLUTION) IMPLANT
WIPE NON LINTING 3.25X3.25 (MISCELLANEOUS) ×3 IMPLANT

## 2016-01-25 NOTE — Op Note (Signed)
OPERATIVE NOTE  RUBIANA ANGELICO [id number] 01/25/2016  PREOPERATIVE DIAGNOSIS:   Nuclear sclerotic cataract left eye with miotic pupil      H25.12   POSTOPERATIVE DIAGNOSIS:   Nuclear sclerotic cataract left eye with miotic pupil.     PROCEDURE:  Phacoemulsification with posterior chamber intraocular lens implantation of the left eye which required pupil stretching with the Malyugin pupil expansion device   LENS:   Implant Name Type Inv. Item Serial No. Manufacturer Lot No. LRB No. Used  technis aspheric iol pre-loaded     [id number] [facility]   Left 1     PCB00 20.5 D   ULTRASOUND TIME: 15 % of 0 minutes, 58 seconds.  CDE 8.6   SURGEON:  Wyonia Hough, MD   ANESTHESIA: Topical with tetracaine drops and 2% Xylocaine jelly, augmented with 1% preservative-free intracameral lidocaine.   COMPLICATIONS:  None.   DESCRIPTION OF PROCEDURE:  The patient was identified in the holding room and transported to the operating room and placed in the supine position under the operating microscope.  The left eye was identified as the operative eye and it was prepped and draped in the usual sterile ophthalmic fashion.   A 1 millimeter clear-corneal paracentesis was made at the 1:30 position.  The anterior chamber was filled with Viscoat viscoelastic.  0.5 ml of preservative-free 1% lidocaine was injected into the anterior chamber.  A 2.4 millimeter keratome was used to make a near-clear corneal incision at the 10:30 position.  A Malyugin pupil expander was then placed through the main incision and into the anterior chamber of the eye.  The edge of the iris was secured on the lip of the pupil expander and it was released, thereby expanding the pupil to approximately 7 millimeters for completion of the cataract surgery.  Additional Viscoat was placed in the anterior chamber.  A cystotome and capsulorrhexis forceps were used to make a curvilinear capsulorrhexis.   Balanced salt solution was used to  hydrodissect and hydrodelineate the lens nucleus.   Phacoemulsification was used in stop and chop fashion to remove the lens, nucleus and epinucleus.  The remaining cortex was aspirated using the irrigation aspiration handpiece.  Additional Provisc was placed into the eye to distend the capsular bag for lens placement.  A lens was then injected into the capsular bag.  The pupil expanding ring was removed using a Kuglen hook and insertion device. The remaining viscoelastic was aspirated from the capsular bag and the anterior chamber.  The anterior chamber was filled with balanced salt solution to inflate to a physiologic pressure.   Wounds were hydrated with balanced salt solution.  The anterior chamber was inflated to a physiologic pressure with balanced salt solution.  No wound leaks were noted. Cefuroxime 0.1 ml of a 10mg /ml solution was injected into the anterior chamber for a dose of 1 mg of intracameral antibiotic at the completion of the case.   Timolol and Brimonidine drops were applied to the eye.  The patient was taken to the recovery room in stable condition without complications of anesthesia or surgery.  Ostin Mathey 01/25/2016, 10:57 AM

## 2016-01-25 NOTE — Anesthesia Procedure Notes (Signed)
Procedure Name: MAC Performed by: Corrin Sieling Pre-anesthesia Checklist: Patient identified, Emergency Drugs available, Suction available, Timeout performed and Patient being monitored Patient Re-evaluated:Patient Re-evaluated prior to inductionOxygen Delivery Method: Nasal cannula Placement Confirmation: positive ETCO2     

## 2016-01-25 NOTE — Anesthesia Postprocedure Evaluation (Signed)
Anesthesia Post Note  Patient: Charlotte Wagner  Procedure(s) Performed: Procedure(s) (LRB): CATARACT EXTRACTION PHACO AND INTRAOCULAR LENS PLACEMENT (IOC) left eye (Left)  Patient location during evaluation: PACU Anesthesia Type: MAC Level of consciousness: awake and alert and oriented Pain management: pain level controlled Vital Signs Assessment: post-procedure vital signs reviewed and stable Respiratory status: spontaneous breathing and nonlabored ventilation Cardiovascular status: stable Postop Assessment: no signs of nausea or vomiting and adequate PO intake Anesthetic complications: no    Estill Batten

## 2016-01-25 NOTE — Transfer of Care (Signed)
Immediate Anesthesia Transfer of Care Note  Patient: Charlotte Wagner  Procedure(s) Performed: Procedure(s) with comments: CATARACT EXTRACTION PHACO AND INTRAOCULAR LENS PLACEMENT ([place]) left eye (Left) - MALYUGIN SHUGARCAINE  Patient Location: PACU  Anesthesia Type: MAC  Level of Consciousness: awake, alert  and patient cooperative  Airway and Oxygen Therapy: Patient Spontanous Breathing and Patient connected to supplemental oxygen  Post-op Assessment: Post-op Vital signs reviewed, Patient's Cardiovascular Status Stable, Respiratory Function Stable, Patent Airway and No signs of Nausea or vomiting  Post-op Vital Signs: Reviewed and stable  Complications: No apparent anesthesia complications

## 2016-01-25 NOTE — Anesthesia Preprocedure Evaluation (Signed)
Anesthesia Evaluation  Patient identified by MRN, date of birth, ID band Patient awake    Reviewed: Allergy & Precautions, NPO status , Patient's Chart, lab work & pertinent test results, reviewed documented beta blocker date and time   Airway Mallampati: II  TM Distance: >3 FB Neck ROM: Full    Dental no notable dental hx.    Pulmonary asthma , former smoker,    Pulmonary exam normal        Cardiovascular hypertension, + CAD and + CABG (3v in 1998)  Normal cardiovascular exam+ dysrhythmias Supra Ventricular Tachycardia + Valvular Problems/Murmurs      Neuro/Psych  Headaches, Depression  Neuromuscular disease    GI/Hepatic hiatal hernia, GERD  Medicated and Controlled,  Endo/Other  PMR  Renal/GU CRFRenal disease     Musculoskeletal  (+) Arthritis , Osteoarthritis,    Abdominal   Peds  Hematology negative hematology ROS (+)   Anesthesia Other Findings   Reproductive/Obstetrics                             Anesthesia Physical  Anesthesia Plan  ASA: III  Anesthesia Plan: MAC   Post-op Pain Management:    Induction: Intravenous  Airway Management Planned:   Additional Equipment:   Intra-op Plan:   Post-operative Plan:   Informed Consent: I have reviewed the patients History and Physical, chart, labs and discussed the procedure including the risks, benefits and alternatives for the proposed anesthesia with the patient or authorized representative who has indicated his/her understanding and acceptance.     Plan Discussed with: CRNA  Anesthesia Plan Comments:         Anesthesia Quick Evaluation

## 2016-01-25 NOTE — H&P (Signed)
  The History and Physical notes are on paper, have been signed, and are to be scanned. The patient remains stable and unchanged from the H&P.   Previous H&P reviewed, patient examined, and there are no changes.  Charlie Seda 01/25/2016 10:05 AM

## 2016-01-26 ENCOUNTER — Encounter: Payer: Self-pay | Admitting: Ophthalmology

## 2016-01-28 ENCOUNTER — Other Ambulatory Visit: Payer: Self-pay | Admitting: Internal Medicine

## 2016-02-06 ENCOUNTER — Other Ambulatory Visit: Payer: Self-pay | Admitting: Internal Medicine

## 2016-02-06 NOTE — Telephone Encounter (Signed)
ok'd refill for hydrocortisone cream x 1

## 2016-02-06 NOTE — Telephone Encounter (Signed)
Okay to refill? 

## 2016-02-08 ENCOUNTER — Other Ambulatory Visit: Payer: Self-pay | Admitting: Internal Medicine

## 2016-02-14 ENCOUNTER — Ambulatory Visit: Payer: Medicare Other | Admitting: Internal Medicine

## 2016-02-23 ENCOUNTER — Ambulatory Visit (INDEPENDENT_AMBULATORY_CARE_PROVIDER_SITE_OTHER): Payer: Medicare Other | Admitting: Internal Medicine

## 2016-02-23 ENCOUNTER — Encounter: Payer: Self-pay | Admitting: Internal Medicine

## 2016-02-23 VITALS — BP 130/70 | HR 63 | Temp 97.9°F | Resp 18 | Ht 63.5 in | Wt 189.2 lb

## 2016-02-23 DIAGNOSIS — M4800 Spinal stenosis, site unspecified: Secondary | ICD-10-CM | POA: Diagnosis not present

## 2016-02-23 DIAGNOSIS — E559 Vitamin D deficiency, unspecified: Secondary | ICD-10-CM

## 2016-02-23 DIAGNOSIS — Z1239 Encounter for other screening for malignant neoplasm of breast: Secondary | ICD-10-CM

## 2016-02-23 DIAGNOSIS — F329 Major depressive disorder, single episode, unspecified: Secondary | ICD-10-CM

## 2016-02-23 DIAGNOSIS — K2100 Gastro-esophageal reflux disease with esophagitis, without bleeding: Secondary | ICD-10-CM

## 2016-02-23 DIAGNOSIS — E7800 Pure hypercholesterolemia, unspecified: Secondary | ICD-10-CM

## 2016-02-23 DIAGNOSIS — I2510 Atherosclerotic heart disease of native coronary artery without angina pectoris: Secondary | ICD-10-CM

## 2016-02-23 DIAGNOSIS — Z0000 Encounter for general adult medical examination without abnormal findings: Secondary | ICD-10-CM

## 2016-02-23 DIAGNOSIS — F32A Depression, unspecified: Secondary | ICD-10-CM

## 2016-02-23 DIAGNOSIS — I10 Essential (primary) hypertension: Secondary | ICD-10-CM

## 2016-02-23 NOTE — Progress Notes (Signed)
Patient ID: Charlotte Wagner, female   DOB: [birth year], 80 y.o.   MRN: [id number]   Subjective:    Patient ID: Charlotte Wagner, female    DOB: 07/14/1936, 80 y.o.   MRN: [id number]  HPI  Patient here for a scheduled follow up.  States she is doing well.  Has increased pain with her back (spinal stenosis).  Walks with a walker and a cane intermittently.  Desires no further intervention at this time.  States not sure what more can be done.  Has seen surgery.  Breathing stable.  No nausea or vomiting.  Bowels stable.     Past Medical History  Diagnosis Date  . Asthma   . Arthritis     s/p bilateral knees and left hip replacement  . Depression   . History of chicken pox   . Cancer ([place])     melanoma right arm  . GERD (gastroesophageal reflux disease)     h/o hiatal hernia  . Allergy   . Heart murmur   . CAD (coronary artery disease)     s/p CABG 12/1996  . Hypertension   . Hyperlipidemia   . Chronic kidney disease   . Hx of migraines     rare now  . Colon polyps     H/O  . Spinal stenosis   . Hx: UTI (urinary tract infection)   . Urine incontinence     H/O  . PUD (peptic ulcer disease)     remote history  . Lichen planus   . Vitamin D deficiency   . Raynaud's phenomenon   . PMR (polymyalgia rheumatica) ([facility])     h/o  . Melanoma ([place])   . History of hiatal hernia   . Headache     migraines in past  . Arrhythmia     rare PVC's, SVT   Past Surgical History  Procedure Laterality Date  . Cholecystectomy  90's  . Breast surgery Right     biopsy x 3 (all benign)  . Adenoidectomy      age 34  . Knee arthroscopy w/ oats procedure      Lt knee (9/01), Rt knee (3/11), Lt hip (5/10)  . Total hip arthroplasty    . Joint replacement      BILATERAL KNEE REPLACEMENTS  . Esophagogastroduodenoscopy N/A 03/22/2015    Procedure: ESOPHAGOGASTRODUODENOSCOPY (EGD);  Surgeon: Lollie Sails, MD;  Location: [facility] ENDOSCOPY;  Service: Endoscopy;  Laterality: N/A;  . Colonoscopy with  propofol N/A 05/03/2015    Procedure: COLONOSCOPY WITH PROPOFOL;  Surgeon: Lollie Sails, MD;  Location: [facility] ENDOSCOPY;  Service: Endoscopy;  Laterality: N/A;  . Bilateral carpal tunnel release    . Coronary artery bypass graft  98    4 vessel  . Back surgery      L3-L5  . Cataract extraction w/phaco Right 01/04/2016    Procedure: CATARACT EXTRACTION PHACO AND INTRAOCULAR LENS PLACEMENT (IOC);  Surgeon: Leandrew Koyanagi, MD;  Location: [place];  Service: Ophthalmology;  Laterality: Right;  MALYUGIN  . Cataract extraction w/phaco Left 01/25/2016    Procedure: CATARACT EXTRACTION PHACO AND INTRAOCULAR LENS PLACEMENT (IOC) left eye;  Surgeon: Leandrew Koyanagi, MD;  Location: [place];  Service: Ophthalmology;  Laterality: Left;  MALYUGIN SHUGARCAINE   Family History  Problem Relation Age of Onset  . Thyroid disease Mother     graves disease  . Heart disease Father     rheumatic heart  . Alcohol abuse Father   .  Depression Father   . Lung cancer Sister   . Depression Daughter   . Thyroid disease Daughter     hashimoto  . Depression Son   . Stroke Maternal Grandmother   . Depression Maternal Grandmother   . Hypertension Maternal Grandfather   . Hypertension Paternal Grandfather    Social History   Social History  . Marital Status: Married    Spouse Name: N/A  . Number of Children: 3  . Years of Education: N/A   Social History Main Topics  . Smoking status: Former Research scientist (life sciences)  . Smokeless tobacco: Never Used     Comment: quit 44+ yrs ago  . Alcohol Use: No  . Drug Use: No  . Sexual Activity: No   Other Topics Concern  . None   Social History Narrative    Outpatient Encounter Prescriptions as of 02/23/2016  Medication Sig  . acetaminophen (TYLENOL) 500 MG tablet Take 1,000 mg by mouth every 6 (six) hours as needed for moderate pain or headache.  [name] amitriptyline (ELAVIL) 25 MG tablet take 1 to 2 tablets by mouth at bedtime  . b complex vitamins  tablet Take 1 tablet by mouth daily.  . bisoprolol (ZEBETA) 5 MG tablet TAKE 1/2 TABLET BY MOUTH DAILY AS DIRECTED  . Cholecalciferol (VITAMIN D3) 2000 UNITS TABS Take 1 capsule by mouth daily.   . Coenzyme Q10 (COQ-10) 100 MG CAPS Take 1 tablet by mouth daily.  . Fluticasone-Salmeterol (ADVAIR DISKUS) 250-50 MCG/DOSE AEPB Inhale 1 puff into the lungs 2 (two) times daily.  . folic acid (FOLVITE) 1 MG tablet take 1 tablet by mouth once daily  . hydrocortisone 2.5 % cream apply to VULVA twice a day AT 10AM AND 4 PM  . ipratropium (ATROVENT HFA) 17 MCG/ACT inhaler Inhale 2 puffs into the lungs every 6 (six) hours as needed for wheezing.  [name] ketoconazole (NIZORAL) 2 % cream Apply 1 application topically daily.  [name] levalbuterol (XOPENEX HFA) 45 MCG/ACT inhaler Inhale 2 puffs into the lungs every 6 (six) hours as needed for wheezing.  [name] loratadine (CLARITIN) 10 MG tablet Take 10 mg by mouth daily as needed for allergies.  [name] meclizine (ANTIVERT) 25 MG tablet Take 25 mg by mouth 3 (three) times daily as needed for dizziness.  . Multiple Vitamins-Minerals (HAIR/SKIN/NAILS PO) Take 1 tablet by mouth daily.  . nabumetone (RELAFEN) 750 MG tablet take 1-2 tablets by mouth once daily if needed  . Omega-3 Fatty Acids (FISH OIL PO) Take 2,000 mg by mouth daily.  . Polyethylene Glycol 3350 (MIRALAX PO) Take 8.5 g by mouth at bedtime.   . ramipril (ALTACE) 10 MG capsule take 1 capsule by mouth once daily  . spironolactone (ALDACTONE) 25 MG tablet take 1 tablet by mouth every morning  . sucralfate (CARAFATE) 1 g tablet take 1 tablet by mouth twice a day if needed  . traMADol (ULTRAM) 50 MG tablet Take 1 tablet (50 mg total) by mouth daily as needed.  . traZODone (DESYREL) 150 MG tablet take 1 and 2/3 tablets to 2 tablets by mouth at bedtime as directed  . verapamil (CALAN-SR) 180 MG CR tablet take 1 tablet by mouth twice a day  . vitamin B-12 (CYANOCOBALAMIN) 1000 MCG tablet Take 1,000 mcg by mouth daily.  .  vitamin C (ASCORBIC ACID) 500 MG tablet Take 500 mg by mouth daily.  [name] ZETIA 10 MG tablet take 1 tablet by mouth once daily  . [DISCONTINUED] pantoprazole (PROTONIX) 40 MG tablet take 1  tablet by mouth twice a day   No facility-administered encounter medications on file as of 02/23/2016.    Review of Systems  Constitutional: Negative for appetite change and unexpected weight change.  HENT: Negative for congestion and sinus pressure.   Respiratory: Negative for cough, chest tightness and shortness of breath.   Cardiovascular: Negative for chest pain, palpitations and leg swelling.  Gastrointestinal: Negative for nausea, vomiting, abdominal pain and diarrhea.  Genitourinary: Negative for dysuria and difficulty urinating.  Musculoskeletal: Positive for back pain.       Difficulty ambulating.    Skin: Negative for color change and rash.  Neurological: Negative for dizziness, light-headedness and headaches.  Psychiatric/Behavioral: Negative for dysphoric mood and agitation.       Objective:    Physical Exam  Constitutional: She appears well-developed and well-nourished. No distress.  HENT:  Nose: Nose normal.  Mouth/Throat: Oropharynx is clear and moist.  Neck: Neck supple. No thyromegaly present.  Cardiovascular: Normal rate and regular rhythm.   Pulmonary/Chest: Breath sounds normal. No respiratory distress. She has no wheezes.  Abdominal: Soft. Bowel sounds are normal. There is no tenderness.  Musculoskeletal: She exhibits no edema or tenderness.  Lymphadenopathy:    She has no cervical adenopathy.  Skin: No rash noted. No erythema.  Psychiatric: She has a normal mood and affect. Her behavior is normal.    BP 130/70 mmHg  Pulse 63  Temp(Src) 97.9 F (36.6 C) (Oral)  Resp 18  Ht 5' 3.5" (1.613 m)  Wt 189 lb 4 oz (85.843 kg)  BMI 32.99 kg/m2  SpO2 98% Wt Readings from Last 3 Encounters:  02/23/16 189 lb 4 oz (85.843 kg)  01/25/16 184 lb (83.462 kg)  01/04/16 185 lb  (83.915 kg)     Lab Results  Component Value Date   WBC 4.9 12/30/2014   HGB 12.6 12/30/2014   HCT 36.9 12/30/2014   PLT 226.0 12/30/2014   GLUCOSE 88 09/12/2015   CHOL 204* 09/12/2015   TRIG 86.0 09/12/2015   HDL 49.70 09/12/2015   LDLDIRECT 126.7 07/03/2013   LDLCALC 137* 09/12/2015   ALT 18 09/12/2015   AST 22 09/12/2015   NA 135 09/12/2015   K 4.8 09/12/2015   CL 100 09/12/2015   CREATININE 1.00 09/12/2015   BUN 18 09/12/2015   CO2 26 09/12/2015   TSH 1.54 09/12/2015       Assessment & Plan:   Problem List Items Addressed This Visit    CAD (coronary artery disease)    Some sob with increased exertion.  Discussed further w/up.  She declines.  Wants to follow.  Continue risk factor modification.       Depression    Stable.  Follow.       Essential hypertension, benign    Blood pressure under good control.  Continue same medication regimen.  Follow pressures.  Follow metabolic panel.        Relevant Orders   CBC with Differential/Platelet   Basic metabolic panel   GERD (gastroesophageal reflux disease)    Has seen GI.  On carafate and protonix.  Follow.       Hypercholesterolemia    Has problems with statin medications.  Low cholesterol diet and exercise.  Follow lipid panel.       Relevant Orders   Lipid panel   Hepatic function panel   Spinal stenosis    Has chronic back pain.  Limits her activity.  Walks with a Programmer, multimedia.  Desires no further intervention.  Follow.       Vitamin D deficiency    Check vitamin D level.       Relevant Orders   VITAMIN D 25 Hydroxy (Vit-D Deficiency, Fractures)    Other Visit Diagnoses    Medicare annual wellness visit, initial    -  Primary    Breast cancer screening        Relevant Orders    MM DIGITAL SCREENING BILATERAL        [name], MD

## 2016-02-23 NOTE — Progress Notes (Signed)
Pre-visit discussion using our clinic review tool. No additional management support is needed unless otherwise documented below in the visit note.  

## 2016-02-23 NOTE — Progress Notes (Addendum)
Subjective:   Charlotte Wagner is a 80 y.o. female who presents for an Initial Medicare Annual Wellness Visit.  Review of Systems    No ROS.  Medicare Wellness Visit.  Cardiac Risk Factors include: advanced age (>19men, >65 women);hypertension     Objective:    Today's Vitals   02/23/16 1407 02/23/16 1445  BP: 130/70   Pulse: 63   Temp: 97.9 F (36.6 C)   TempSrc: Oral   Resp: 18   Height: 5' 3.5" (1.613 m)   Weight: 189 lb 4 oz (85.843 kg)   SpO2: 98%   PainSc:  2    Body mass index is 32.99 kg/(m^2).   Current Medications (verified) Outpatient Encounter Prescriptions as of 02/23/2016  Medication Sig  . acetaminophen (TYLENOL) 500 MG tablet Take 1,000 mg by mouth every 6 (six) hours as needed for moderate pain or headache.  [name] amitriptyline (ELAVIL) 25 MG tablet take 1 to 2 tablets by mouth at bedtime  . b complex vitamins tablet Take 1 tablet by mouth daily.  . bisoprolol (ZEBETA) 5 MG tablet TAKE 1/2 TABLET BY MOUTH DAILY AS DIRECTED  . Cholecalciferol (VITAMIN D3) 2000 UNITS TABS Take 1 capsule by mouth daily.   . Coenzyme Q10 (COQ-10) 100 MG CAPS Take 1 tablet by mouth daily.  . Fluticasone-Salmeterol (ADVAIR DISKUS) 250-50 MCG/DOSE AEPB Inhale 1 puff into the lungs 2 (two) times daily.  . folic acid (FOLVITE) 1 MG tablet take 1 tablet by mouth once daily  . hydrocortisone 2.5 % cream apply to VULVA twice a day AT 10AM AND 4 PM  . ipratropium (ATROVENT HFA) 17 MCG/ACT inhaler Inhale 2 puffs into the lungs every 6 (six) hours as needed for wheezing.  [name] ketoconazole (NIZORAL) 2 % cream Apply 1 application topically daily.  [name] levalbuterol (XOPENEX HFA) 45 MCG/ACT inhaler Inhale 2 puffs into the lungs every 6 (six) hours as needed for wheezing.  [name] loratadine (CLARITIN) 10 MG tablet Take 10 mg by mouth daily as needed for allergies.  [name] meclizine (ANTIVERT) 25 MG tablet Take 25 mg by mouth 3 (three) times daily as needed for dizziness.  . Multiple Vitamins-Minerals  (HAIR/SKIN/NAILS PO) Take 1 tablet by mouth daily.  . nabumetone (RELAFEN) 750 MG tablet take 1-2 tablets by mouth once daily if needed  . Omega-3 Fatty Acids (FISH OIL PO) Take 2,000 mg by mouth daily.  . pantoprazole (PROTONIX) 40 MG tablet take 1 tablet by mouth twice a day  . Polyethylene Glycol 3350 (MIRALAX PO) Take 8.5 g by mouth at bedtime.   . ramipril (ALTACE) 10 MG capsule take 1 capsule by mouth once daily  . spironolactone (ALDACTONE) 25 MG tablet take 1 tablet by mouth every morning  . sucralfate (CARAFATE) 1 g tablet take 1 tablet by mouth twice a day if needed  . traMADol (ULTRAM) 50 MG tablet Take 1 tablet (50 mg total) by mouth daily as needed.  . traZODone (DESYREL) 150 MG tablet take 1 and 2/3 tablets to 2 tablets by mouth at bedtime as directed  . verapamil (CALAN-SR) 180 MG CR tablet take 1 tablet by mouth twice a day  . vitamin B-12 (CYANOCOBALAMIN) 1000 MCG tablet Take 1,000 mcg by mouth daily.  . vitamin C (ASCORBIC ACID) 500 MG tablet Take 500 mg by mouth daily.  [name] ZETIA 10 MG tablet take 1 tablet by mouth once daily   No facility-administered encounter medications on file as of 02/23/2016.    Allergies (verified) Albuterol;  Aspirin; Penicillins; Statins; Tetracyclines & related; Sulfa antibiotics; and Tetracycline   History: Past Medical History  Diagnosis Date  . Asthma   . Arthritis     s/p bilateral knees and left hip replacement  . Depression   . History of chicken pox   . Cancer ([place])     melanoma right arm  . GERD (gastroesophageal reflux disease)     h/o hiatal hernia  . Allergy   . Heart murmur   . CAD (coronary artery disease)     s/p CABG 12/1996  . Hypertension   . Hyperlipidemia   . Chronic kidney disease   . Hx of migraines     rare now  . Colon polyps     H/O  . Spinal stenosis   . Hx: UTI (urinary tract infection)   . Urine incontinence     H/O  . PUD (peptic ulcer disease)     remote history  . Lichen planus   . Vitamin D  deficiency   . Raynaud's phenomenon   . PMR (polymyalgia rheumatica) (HCC)     h/o  . Melanoma ([place])   . History of hiatal hernia   . Headache     migraines in past  . Arrhythmia     rare PVC's, SVT   Past Surgical History  Procedure Laterality Date  . Cholecystectomy  90's  . Breast surgery Right     biopsy x 3 (all benign)  . Adenoidectomy      age 84  . Knee arthroscopy w/ oats procedure      Lt knee (9/01), Rt knee (3/11), Lt hip (5/10)  . Total hip arthroplasty    . Joint replacement      BILATERAL KNEE REPLACEMENTS  . Esophagogastroduodenoscopy N/A 03/22/2015    Procedure: ESOPHAGOGASTRODUODENOSCOPY (EGD);  Surgeon: Lollie Sails, MD;  Location: [facility] ENDOSCOPY;  Service: Endoscopy;  Laterality: N/A;  . Colonoscopy with propofol N/A 05/03/2015    Procedure: COLONOSCOPY WITH PROPOFOL;  Surgeon: Lollie Sails, MD;  Location: [facility] ENDOSCOPY;  Service: Endoscopy;  Laterality: N/A;  . Bilateral carpal tunnel release    . Coronary artery bypass graft  98    4 vessel  . Back surgery      L3-L5  . Cataract extraction w/phaco Right 01/04/2016    Procedure: CATARACT EXTRACTION PHACO AND INTRAOCULAR LENS PLACEMENT (IOC);  Surgeon: Leandrew Koyanagi, MD;  Location: [place];  Service: Ophthalmology;  Laterality: Right;  MALYUGIN  . Cataract extraction w/phaco Left 01/25/2016    Procedure: CATARACT EXTRACTION PHACO AND INTRAOCULAR LENS PLACEMENT (IOC) left eye;  Surgeon: Leandrew Koyanagi, MD;  Location: [place];  Service: Ophthalmology;  Laterality: Left;  MALYUGIN SHUGARCAINE   Family History  Problem Relation Age of Onset  . Thyroid disease Mother     graves disease  . Heart disease Father     rheumatic heart  . Alcohol abuse Father   . Depression Father   . Lung cancer Sister   . Depression Daughter   . Thyroid disease Daughter     hashimoto  . Depression Son   . Stroke Maternal Grandmother   . Depression Maternal Grandmother   .  Hypertension Maternal Grandfather   . Hypertension Paternal Grandfather    Social History   Occupational History  . Not on file.   Social History Main Topics  . Smoking status: Former Research scientist (life sciences)  . Smokeless tobacco: Never Used     Comment: quit 44+ yrs ago  .  Alcohol Use: No  . Drug Use: No  . Sexual Activity: No    Tobacco Counseling Counseling given: Not Answered   Activities of Daily Living In your present state of health, do you have any difficulty performing the following activities: 02/23/2016 01/25/2016  Hearing? N N  Vision? N N  Difficulty concentrating or making decisions? N N  Walking or climbing stairs? Y Y  Dressing or bathing? N N  Doing errands, shopping? N -  Preparing Food and eating ? N -  Using the Toilet? N -  In the past six months, have you accidently leaked urine? N -  Do you have problems with loss of bowel control? N -  Managing your Medications? N -  Managing your Finances? N -  Housekeeping or managing your Housekeeping? N -    Immunizations and Health Maintenance Immunization History  Administered Date(s) Administered  . Influenza,inj,Quad PF,36+ Mos 06/15/2014  . Influenza-Unspecified 07/13/2015  . Pneumococcal Conjugate-13 01/13/2015  . Pneumococcal Polysaccharide-23 06/21/2009  . Tdap 02/22/2010  . Zoster 11/15/2010   Health Maintenance Due  Topic Date Due  . MAMMOGRAM  02/01/2016    Patient Care Team: [name], MD as PCP - General (Internal Medicine)  Indicate any recent Medical Services you may have received from other than [facility] providers in the past year (date may be approximate).     Assessment:   This is a routine wellness examination for Charlotte Wagner. The goal of the wellness visit is to assist the patient how to close the gaps in care and create a preventative care plan for the patient.   Taking VIT D3 as appropriate/Osteoporosis reviewed.  Medications reviewed; taking without issues or barriers.  Safety issues  reviewed; smoke detectors in the home. No firearms in the home. Wears seatbelts when driving or riding with others. No violence in the home.  No identified risk were noted; The patient was oriented x 3; appropriate in dress and manner and no objective failures at ADL's or IADL's.   DEXA Scan postponed, per patient request for follow up with PCP.  Educational material provided.    Mammogram ordered.  Patient Concerns:  None at this time.  Follow up with PCP as needed.  Hearing/Vision screen  Visual Acuity Screening   Right eye Left eye Both eyes  Without correction:     With correction:   20/30  Comments: [facility], Dr. Wallace Going Bilateral cataracts extracted Semi-annual visits Wears glasses   Hearing Screening Comments: Passed the whisper test  Dietary issues and exercise activities discussed: Current Exercise Habits: Structured exercise class (Water aerobics x2 weekly), Type of exercise: walking, Time (Minutes): 40, Frequency (Times/Week): 3, Weekly Exercise (Minutes/Week): 120, Intensity: Mild  Goals    . Healthy Lifestyle     Stay hydrated and drink plenty of fluids.  Increase water intake. Low carb foods.  Lean meats and vegetables. Stay active and continue exercise regiment.      Depression Screen PHQ 2/9 Scores 02/23/2016 08/16/2015 12/21/2014  PHQ - 2 Score 0 0 0    Fall Risk Fall Risk  02/23/2016 08/16/2015 12/21/2014  Falls in the past year? Yes Yes No  Number falls in past yr: 1 1 -  Injury with Fall? Yes Yes -  Follow up Falls prevention discussed;Education provided - -    Cognitive Function: MMSE - Mini Mental State Exam 02/23/2016  Orientation to time 5  Orientation to Place 5  Registration 3  Attention/ Calculation 5  Recall 3  Language- name 2 objects 2  Language- repeat 1  Language- follow 3 step command 3  Language- read & follow direction 1  Write a sentence 1  Copy design 1  Total score 30    Screening Tests Health  Maintenance  Topic Date Due  . MAMMOGRAM  02/01/2016  . DEXA SCAN  08/08/2017 (Originally 04/08/2003)  . INFLUENZA VACCINE  05/08/2016  . TETANUS/TDAP  02/23/2020  . COLONOSCOPY  05/02/2020  . ZOSTAVAX  Addressed  . PNA vac Low Risk Adult  Completed      Plan:   End of life planning; Advance aging; Advanced directives discussed. Copy of current HCPOA/Living Will requested.   Mammogram as directed.  Return in 1 month for fasting labs/6 months for physical as instructed by PCP.  Follow up with PCP as needed.  During the course of the visit, Charlotte Wagner was educated and counseled about the following appropriate screening and preventive services:   Vaccines to include Pneumoccal, Influenza, Hepatitis B, Td, Zostavax, HCV  Electrocardiogram  Cardiovascular disease screening  Colorectal cancer screening  Bone density screening  Diabetes screening  Glaucoma screening  Mammography/PAP  Nutrition counseling  Smoking cessation counseling  Patient Instructions (the written plan) were given to the patient.    Varney Biles, LPN   [id number]    Reviewed above information.  Agree with plan.

## 2016-02-23 NOTE — Patient Instructions (Addendum)
[name] , Thank you for taking time to come for your Medicare Wellness Visit. I appreciate your ongoing commitment to your health goals. Please review the following plan we discussed and let me know if I can assist you in the future.   Mammogram as directed.  Return in 1 month for fasting labs/6 months for physical as instructed by PCP.  Follow up with Dr. Nicki Reaper as needed.   This is a list of the screening recommended for you and due dates:  Health Maintenance  Topic Date Due  . Mammogram  02/01/2016  . DEXA scan (bone density measurement)  08/08/2017*  . Flu Shot  05/08/2016  . Tetanus Vaccine  02/23/2020  . Colon Cancer Screening  05/02/2020  . Shingles Vaccine  Addressed  . Pneumonia vaccines  Completed  *Topic was postponed. The date shown is not the original due date.    Bone Densitometry Bone densitometry is an imaging test that uses a special X-ray to measure the amount of calcium and other minerals in your bones (bone density). This test is also known as a bone mineral density test or dual-energy X-ray absorptiometry (DXA). The test can measure bone density at your hip and your spine. It is similar to having a regular X-ray. You may have this test to:  Diagnose a condition that causes weak or thin bones (osteoporosis).  Predict your risk of a broken bone (fracture).  Determine how well osteoporosis treatment is working. LET [facility] CARE PROVIDER KNOW ABOUT:  Any allergies you have.  All medicines you are taking, including vitamins, herbs, eye drops, creams, and over-the-counter medicines.  Previous problems you or members of your family have had with the use of anesthetics.  Any blood disorders you have.  Previous surgeries you have had.  Medical conditions you have.  Possibility of pregnancy.  Any other medical test you had within the previous 14 days that used contrast material. RISKS AND COMPLICATIONS Generally, this is a safe procedure. However,  problems can occur and may include the following:  This test exposes you to a very small amount of radiation.  The risks of radiation exposure may be greater to unborn children. BEFORE THE PROCEDURE  Do not take any calcium supplements for 24 hours before having the test. You can otherwise eat and drink what you usually do.  Take off all metal jewelry, eyeglasses, dental appliances, and any other metal objects. PROCEDURE  You may lie on an exam table. There will be an X-ray generator below you and an imaging device above you.  Other devices, such as boxes or braces, may be used to position your body properly for the scan.  You will need to lie still while the machine slowly scans your body.  The images will show up on a computer monitor. AFTER THE PROCEDURE You may need more testing at a later time.   This information is not intended to replace advice given to you by your health care provider. Make sure you discuss any questions you have with your health care provider.   Document Released: 10/16/2004 Document Revised: 10/15/2014 Document Reviewed: 03/04/2014 Elsevier Interactive Patient Education 2016 [place] in the Home  Falls can cause injuries. They can happen to people of all ages. There are many things you can do to make your home safe and to help prevent falls.  WHAT CAN I DO ON THE OUTSIDE OF MY HOME?  Regularly fix the edges of walkways and driveways and fix  any cracks.  Remove anything that might make you trip as you walk through a door, such as a raised step or threshold.  Trim any bushes or trees on the path to your home.  Use bright outdoor lighting.  Clear any walking paths of anything that might make someone trip, such as rocks or tools.  Regularly check to see if handrails are loose or broken. Make sure that both sides of any steps have handrails.  Any raised decks and porches should have guardrails on the edges.  Have any leaves,  snow, or ice cleared regularly.  Use sand or salt on walking paths during winter.  Clean up any spills in your garage right away. This includes oil or grease spills. WHAT CAN I DO IN THE BATHROOM?   Use night lights.  Install grab bars by the toilet and in the tub and shower. Do not use towel bars as grab bars.  Use non-skid mats or decals in the tub or shower.  If you need to sit down in the shower, use a plastic, non-slip stool.  Keep the floor dry. Clean up any water that spills on the floor as soon as it happens.  Remove soap buildup in the tub or shower regularly.  Attach bath mats securely with double-sided non-slip rug tape.  Do not have throw rugs and other things on the floor that can make you trip. WHAT CAN I DO IN THE BEDROOM?  Use night lights.  Make sure that you have a light by your bed that is easy to reach.  Do not use any sheets or blankets that are too big for your bed. They should not hang down onto the floor.  Have a firm chair that has side arms. You can use this for support while you get dressed.  Do not have throw rugs and other things on the floor that can make you trip. WHAT CAN I DO IN THE KITCHEN?  Clean up any spills right away.  Avoid walking on wet floors.  Keep items that you use a lot in easy-to-reach places.  If you need to reach something above you, use a strong step stool that has a grab bar.  Keep electrical cords out of the way.  Do not use floor polish or wax that makes floors slippery. If you must use wax, use non-skid floor wax.  Do not have throw rugs and other things on the floor that can make you trip. WHAT CAN I DO WITH MY STAIRS?  Do not leave any items on the stairs.  Make sure that there are handrails on both sides of the stairs and use them. Fix handrails that are broken or loose. Make sure that handrails are as long as the stairways.  Check any carpeting to make sure that it is firmly attached to the stairs. Fix  any carpet that is loose or worn.  Avoid having throw rugs at the top or bottom of the stairs. If you do have throw rugs, attach them to the floor with carpet tape.  Make sure that you have a light switch at the top of the stairs and the bottom of the stairs. If you do not have them, ask someone to add them for you. WHAT ELSE CAN I DO TO HELP PREVENT FALLS?  Wear shoes that:  Do not have high heels.  Have rubber bottoms.  Are comfortable and fit you well.  Are closed at the toe. Do not wear sandals.  If you  use a stepladder:  Make sure that it is fully opened. Do not climb a closed stepladder.  Make sure that both sides of the stepladder are locked into place.  Ask someone to hold it for you, if possible.  Clearly mark and make sure that you can see:  Any grab bars or handrails.  First and last steps.  Where the edge of each step is.  Use tools that help you move around (mobility aids) if they are needed. These include:  Canes.  Walkers.  Scooters.  Crutches.  Turn on the lights when you go into a dark area. Replace any light bulbs as soon as they burn out.  Set up your furniture so you have a clear path. Avoid moving your furniture around.  If any of your floors are uneven, fix them.  If there are any pets around you, be aware of where they are.  Review your medicines with your doctor. Some medicines can make you feel dizzy. This can increase your chance of falling. Ask your doctor what other things that you can do to help prevent falls.   This information is not intended to replace advice given to you by your health care provider. Make sure you discuss any questions you have with your health care provider.   Document Released: 07/21/2009 Document Revised: 02/08/2015 Document Reviewed: 10/29/2014 Elsevier Interactive Patient Education [employer].

## 2016-02-24 ENCOUNTER — Other Ambulatory Visit: Payer: Self-pay

## 2016-02-24 MED ORDER — PANTOPRAZOLE SODIUM 40 MG PO TBEC: 40.0000 mg | DELAYED_RELEASE_TABLET | Freq: Two times a day (BID) | ORAL | Status: DC

## 2016-02-27 ENCOUNTER — Encounter: Payer: Self-pay | Admitting: Internal Medicine

## 2016-02-27 NOTE — Assessment & Plan Note (Signed)
Has chronic back pain.  Limits her activity.  Walks with a Programmer, multimedia.  Desires no further intervention.  Follow.

## 2016-02-27 NOTE — Assessment & Plan Note (Signed)
Some sob with increased exertion.  Discussed further w/up.  She declines.  Wants to follow.  Continue risk factor modification.

## 2016-02-27 NOTE — Assessment & Plan Note (Signed)
Blood pressure under good control.  Continue same medication regimen.  Follow pressures.  Follow metabolic panel.   

## 2016-02-27 NOTE — Assessment & Plan Note (Signed)
Has problems with statin medications.  Low cholesterol diet and exercise.  Follow lipid panel.

## 2016-02-27 NOTE — Assessment & Plan Note (Signed)
Has seen GI.  On carafate and protonix.  Follow.

## 2016-02-27 NOTE — Assessment & Plan Note (Signed)
Check vitamin D level 

## 2016-02-27 NOTE — Addendum Note (Signed)
Addended by: Alisa Graff on: 02/27/2016 04:47 PM   Modules accepted: Level of Service

## 2016-02-27 NOTE — Assessment & Plan Note (Signed)
Stable.  Follow.   

## 2016-03-03 ENCOUNTER — Other Ambulatory Visit: Payer: Self-pay | Admitting: Internal Medicine

## 2016-03-06 ENCOUNTER — Other Ambulatory Visit: Payer: Self-pay | Admitting: Internal Medicine

## 2016-03-06 ENCOUNTER — Ambulatory Visit
Admission: RE | Admit: 2016-03-06 | Discharge: 2016-03-06 | Disposition: A | Discharge status: Home / Self Care | Payer: Medicare Other | Source: Ambulatory Visit | Attending: Internal Medicine | Admitting: Internal Medicine

## 2016-03-06 DIAGNOSIS — Z1239 Encounter for other screening for malignant neoplasm of breast: Secondary | ICD-10-CM

## 2016-03-06 DIAGNOSIS — Z1231 Encounter for screening mammogram for malignant neoplasm of breast: Secondary | ICD-10-CM | POA: Diagnosis not present

## 2016-03-07 ENCOUNTER — Other Ambulatory Visit: Payer: Self-pay | Admitting: Internal Medicine

## 2016-03-14 ENCOUNTER — Other Ambulatory Visit: Payer: Self-pay | Admitting: Internal Medicine

## 2016-03-26 ENCOUNTER — Other Ambulatory Visit (INDEPENDENT_AMBULATORY_CARE_PROVIDER_SITE_OTHER): Payer: Medicare Other

## 2016-03-26 DIAGNOSIS — E559 Vitamin D deficiency, unspecified: Secondary | ICD-10-CM | POA: Diagnosis not present

## 2016-03-26 DIAGNOSIS — E7800 Pure hypercholesterolemia, unspecified: Secondary | ICD-10-CM | POA: Diagnosis not present

## 2016-03-26 DIAGNOSIS — I10 Essential (primary) hypertension: Secondary | ICD-10-CM | POA: Diagnosis not present

## 2016-03-26 LAB — CBC WITH DIFFERENTIAL/PLATELET
BASOS ABS: 0 10*3/uL (ref 0.0–0.1)
Basophils Relative: 0.5 % (ref 0.0–3.0)
Eosinophils Absolute: 0.1 10*3/uL (ref 0.0–0.7)
Eosinophils Relative: 3 % (ref 0.0–5.0)
HEMATOCRIT: 37.8 % (ref 36.0–46.0)
HEMOGLOBIN: 13 g/dL (ref 12.0–15.0)
LYMPHS PCT: 24.8 % (ref 12.0–46.0)
Lymphs Abs: 1 10*3/uL (ref 0.7–4.0)
MCHC: 34.4 g/dL (ref 30.0–36.0)
MCV: 100 fl (ref 78.0–100.0)
MONOS PCT: 12.8 % — AB (ref 3.0–12.0)
Monocytes Absolute: 0.5 10*3/uL (ref 0.1–1.0)
NEUTROS ABS: 2.3 10*3/uL (ref 1.4–7.7)
Neutrophils Relative %: 58.9 % (ref 43.0–77.0)
PLATELETS: 254 10*3/uL (ref 150.0–400.0)
RBC: 3.79 Mil/uL — ABNORMAL LOW (ref 3.87–5.11)
RDW: 12.7 % (ref 11.5–15.5)
WBC: 3.9 10*3/uL — ABNORMAL LOW (ref 4.0–10.5)

## 2016-03-26 LAB — LIPID PANEL
CHOL/HDL RATIO: 4
Cholesterol: 221 mg/dL — ABNORMAL HIGH (ref 0–200)
HDL: 54.8 mg/dL (ref >39.00)
LDL CALC: 143 mg/dL — AB (ref 0–99)
NONHDL: 166.59
Triglycerides: 119 mg/dL (ref 0.0–149.0)
VLDL: 23.8 mg/dL (ref 0.0–40.0)

## 2016-03-26 LAB — BASIC METABOLIC PANEL
BUN: 14 mg/dL (ref 6–23)
CHLORIDE: 102 meq/L (ref 96–112)
CO2: 28 meq/L (ref 19–32)
Calcium: 9.5 mg/dL (ref 8.4–10.5)
Creatinine, Ser: 0.92 mg/dL (ref 0.40–1.20)
GFR: 62.75 mL/min (ref >60.00)
Glucose, Bld: 84 mg/dL (ref 70–99)
POTASSIUM: 4.2 meq/L (ref 3.5–5.1)
SODIUM: 138 meq/L (ref 135–145)

## 2016-03-26 LAB — HEPATIC FUNCTION PANEL
ALBUMIN: 4.3 g/dL (ref 3.5–5.2)
ALK PHOS: 51 U/L (ref 39–117)
ALT: 18 U/L (ref 0–35)
AST: 20 U/L (ref 0–37)
Bilirubin, Direct: 0.1 mg/dL (ref 0.0–0.3)
Total Bilirubin: 0.5 mg/dL (ref 0.2–1.2)
Total Protein: 6.6 g/dL (ref 6.0–8.3)

## 2016-03-26 LAB — VITAMIN D 25 HYDROXY (VIT D DEFICIENCY, FRACTURES): VITD: 32.47 ng/mL (ref 30.00–100.00)

## 2016-03-27 ENCOUNTER — Other Ambulatory Visit: Payer: Self-pay | Admitting: Internal Medicine

## 2016-03-27 ENCOUNTER — Encounter: Payer: Self-pay | Admitting: *Deleted

## 2016-03-27 DIAGNOSIS — D72819 Decreased white blood cell count, unspecified: Secondary | ICD-10-CM

## 2016-03-27 NOTE — Progress Notes (Signed)
Order placed for f/u [facility].   

## 2016-04-06 ENCOUNTER — Telehealth: Payer: Self-pay | Admitting: *Deleted

## 2016-04-06 NOTE — Telephone Encounter (Signed)
Patient has requested to have her lab appt.moved closer to her appt in November, she stated that this will give her time to work on her cholesterol.  Pt contact [phone]

## 2016-04-06 NOTE — Telephone Encounter (Signed)
Labs have been rescheduled 2 weeks prior to next visit in November.  She is aware of all lab levels and states she is OK with the numbers received and will work on her health.

## 2016-04-15 ENCOUNTER — Other Ambulatory Visit: Payer: Self-pay | Admitting: Internal Medicine

## 2016-04-19 ENCOUNTER — Other Ambulatory Visit: Payer: Medicare Other

## 2016-04-25 ENCOUNTER — Other Ambulatory Visit: Payer: Self-pay | Admitting: Internal Medicine

## 2016-05-29 ENCOUNTER — Ambulatory Visit (INDEPENDENT_AMBULATORY_CARE_PROVIDER_SITE_OTHER): Payer: Medicare Other | Admitting: Family

## 2016-05-29 ENCOUNTER — Encounter: Payer: Self-pay | Admitting: Family

## 2016-05-29 ENCOUNTER — Encounter (INDEPENDENT_AMBULATORY_CARE_PROVIDER_SITE_OTHER): Payer: Self-pay

## 2016-05-29 VITALS — BP 122/76 | HR 61 | Temp 98.3°F | Resp 16 | Wt 187.0 lb

## 2016-05-29 DIAGNOSIS — I2510 Atherosclerotic heart disease of native coronary artery without angina pectoris: Secondary | ICD-10-CM | POA: Diagnosis not present

## 2016-05-29 DIAGNOSIS — R002 Palpitations: Secondary | ICD-10-CM | POA: Insufficient documentation

## 2016-05-29 NOTE — Assessment & Plan Note (Addendum)
H/o PVCs and PACs per patient. No chest pain, SOB to warrant higher level of care/ ED.   EKG: HR 59. AV block, no ischemia. When compared to prior EKGs, 11/2011 EKG showed normal sinus rhythm. 12/2012 shows atrial fibrillation. SEM. Pending echo for structural disease. Referral to cardiology as concern for patient to be on beta blocker with AV block.

## 2016-05-29 NOTE — Progress Notes (Signed)
Subjective:    Patient ID: Charlotte Wagner, female    DOB: 06/06/1936, 80 y.o.   MRN: [id number]  CC: Charlotte Wagner is a 80 y.o. female who presents today for an acute visit.    HPI: Patient here for evaluation of heart palpitations over one week ago, intermittent. Notes a lot of distress from family stress right now.  Takes bisoprolol half tablet daily. No lightheadedness, syncope. Denies exertional chest pain or pressure, numbness or tingling radiating to left arm or jaw, palpitations, dizziness, frequent headaches, changes in vision, or shortness of breath.   Long h/o PVCs.          HISTORY:  Past Medical History:  Diagnosis Date  . Allergy   . Arrhythmia    rare PVC's, SVT  . Arthritis    s/p bilateral knees and left hip replacement  . Asthma   . CAD (coronary artery disease)    s/p CABG 12/1996  . Cancer ([place])    melanoma right arm  . Chronic kidney disease   . Colon polyps    H/O  . Depression   . GERD (gastroesophageal reflux disease)    h/o hiatal hernia  . Headache    migraines in past  . Heart murmur   . History of chicken pox   . History of hiatal hernia   . Hx of migraines    rare now  . Hx: UTI (urinary tract infection)   . Hyperlipidemia   . Hypertension   . Lichen planus   . Melanoma ([place])   . PMR (polymyalgia rheumatica) ([facility])    h/o  . PUD (peptic ulcer disease)    remote history  . Raynaud's phenomenon   . Spinal stenosis   . Urine incontinence    H/O  . Vitamin D deficiency    Past Surgical History:  Procedure Laterality Date  . ADENOIDECTOMY     age 53  . BACK SURGERY     L3-L5  . BILATERAL CARPAL TUNNEL RELEASE    . BREAST BIOPSY Right    bx x 3 neg  . BREAST SURGERY Right    biopsy x 3 (all benign)  . CATARACT EXTRACTION W/PHACO Right 01/04/2016   Procedure: CATARACT EXTRACTION PHACO AND INTRAOCULAR LENS PLACEMENT (IOC);  Surgeon: Leandrew Koyanagi, MD;  Location: [facility] [facility];  Service: Ophthalmology;  Laterality:  Right;  MALYUGIN  . CATARACT EXTRACTION W/PHACO Left 01/25/2016   Procedure: CATARACT EXTRACTION PHACO AND INTRAOCULAR LENS PLACEMENT ([place]) left eye;  Surgeon: Leandrew Koyanagi, MD;  Location: [facility];  Service: Ophthalmology;  Laterality: Left;  MALYUGIN SHUGARCAINE  . CHOLECYSTECTOMY  90's  . COLONOSCOPY WITH PROPOFOL N/A 05/03/2015   Procedure: COLONOSCOPY WITH PROPOFOL;  Surgeon: Lollie Sails, MD;  Location: [facility] ENDOSCOPY;  Service: Endoscopy;  Laterality: N/A;  . CORONARY ARTERY BYPASS GRAFT  98   4 vessel  . ESOPHAGOGASTRODUODENOSCOPY N/A 03/22/2015   Procedure: ESOPHAGOGASTRODUODENOSCOPY (EGD);  Surgeon: Lollie Sails, MD;  Location: [facility] ENDOSCOPY;  Service: Endoscopy;  Laterality: N/A;  . JOINT REPLACEMENT     BILATERAL KNEE REPLACEMENTS  . KNEE ARTHROSCOPY W/ OATS PROCEDURE     Lt knee (9/01), Rt knee (3/11), Lt hip (5/10)  . TOTAL HIP ARTHROPLASTY     Family History  Problem Relation Age of Onset  . Thyroid disease Mother     graves disease  . Heart disease Father     rheumatic heart  . Alcohol abuse Father   . Depression  Father   . Lung cancer Sister   . Depression Daughter   . Thyroid disease Daughter     hashimoto  . Depression Son   . Stroke Maternal Grandmother   . Depression Maternal Grandmother   . Hypertension Maternal Grandfather   . Hypertension Paternal Grandfather   . Breast cancer Neg Hx     Allergies: Albuterol; Aspirin; Penicillins; Statins; Tetracyclines & related; Sulfa antibiotics; and Tetracycline Current Outpatient Prescriptions on File Prior to Visit  Medication Sig Dispense Refill  . acetaminophen (TYLENOL) 500 MG tablet Take 1,000 mg by mouth every 6 (six) hours as needed for moderate pain or headache.    . b complex vitamins tablet Take 1 tablet by mouth daily.    . bisoprolol (ZEBETA) 5 MG tablet TAKE 1/2 TABLET BY MOUTH ONCE DAILY AS DIRECTED 30 tablet 2  . Cholecalciferol (VITAMIN D3) 2000 UNITS TABS Take 1 capsule  by mouth daily.     . Coenzyme Q10 (COQ-10) 100 MG CAPS Take 1 tablet by mouth daily.    . Fluticasone-Salmeterol (ADVAIR DISKUS) 250-50 MCG/DOSE AEPB Inhale 1 puff into the lungs 2 (two) times daily. (Patient taking differently: Inhale 1 puff into the lungs as needed. ) 60 each 1  . folic acid (FOLVITE) 1 MG tablet take 1 tablet by mouth once daily 30 tablet 5  . hydrocortisone 2.5 % cream apply to VULVA twice a day AT 10AM AND 4 PM 28 g 0  . ipratropium (ATROVENT HFA) 17 MCG/ACT inhaler Inhale 2 puffs into the lungs every 6 (six) hours as needed for wheezing. (Patient taking differently: Inhale 2 puffs into the lungs as needed for wheezing. ) 1 Inhaler 2  . ketoconazole (NIZORAL) 2 % cream Apply 1 application topically daily. 30 g 0  . meclizine (ANTIVERT) 25 MG tablet Take 25 mg by mouth 3 (three) times daily as needed for dizziness.    . Multiple Vitamins-Minerals (HAIR/SKIN/NAILS PO) Take 1 tablet by mouth daily.    . Omega-3 Fatty Acids (FISH OIL PO) Take 2,000 mg by mouth daily.    . pantoprazole (PROTONIX) 40 MG tablet Take 1 tablet (40 mg total) by mouth 2 (two) times daily. 60 tablet 5  . Polyethylene Glycol 3350 (MIRALAX PO) Take 8.5 g by mouth at bedtime.     . ramipril (ALTACE) 10 MG capsule take 1 capsule by mouth once daily 30 capsule 3  . spironolactone (ALDACTONE) 25 MG tablet take 1 tablet by mouth every morning 30 tablet 5  . sucralfate (CARAFATE) 1 g tablet take 1 tablet by mouth twice a day if needed 30 tablet 0  . traMADol (ULTRAM) 50 MG tablet Take 1 tablet (50 mg total) by mouth daily as needed. 20 tablet 0  . traZODone (DESYREL) 150 MG tablet TAKE 1 AND 2/3 TABLETS TO 2 TABLET BY MOUTH AT BEDTIME AS DIRECTED 60 tablet 2  . verapamil (CALAN-SR) 180 MG CR tablet take 1 tablet by mouth twice a day 60 tablet 5  . vitamin B-12 (CYANOCOBALAMIN) 1000 MCG tablet Take 1,000 mcg by mouth daily.    . vitamin C (ASCORBIC ACID) 500 MG tablet Take 500 mg by mouth daily.    [name] ZETIA 10  MG tablet take 1 tablet by mouth once daily 30 tablet 7  . levalbuterol (XOPENEX HFA) 45 MCG/ACT inhaler Inhale 2 puffs into the lungs every 6 (six) hours as needed for wheezing. (Patient not taking: Reported on 05/29/2016) 1 Inhaler 2   No current facility-administered medications  on file prior to visit.     Social History  Substance Use Topics  . Smoking status: Former Research scientist (life sciences)  . Smokeless tobacco: Never Used     Comment: quit 44+ yrs ago  . Alcohol use No    Review of Systems  Constitutional: Negative for chills, fever and unexpected weight change.  HENT: Negative for congestion.   Respiratory: Negative for cough, shortness of breath and wheezing.   Cardiovascular: Positive for palpitations. Negative for chest pain and leg swelling.  Gastrointestinal: Negative for nausea and vomiting.  Musculoskeletal: Negative for arthralgias and myalgias.  Skin: Negative for rash.  Neurological: Negative for dizziness, syncope, weakness, light-headedness and headaches.  Hematological: Negative for adenopathy.  Psychiatric/Behavioral: Negative for confusion.      Objective:    BP 122/76 (BP Location: Left Arm, Patient Position: Sitting, Cuff Size: Large)   Pulse 61   Temp 98.3 F (36.8 C) (Oral)   Resp 16   Wt 187 lb (84.8 kg)   SpO2 97%   BMI 32.61 kg/m    Physical Exam  Constitutional: She appears well-developed and well-nourished.  Eyes: Conjunctivae are normal.  Cardiovascular: Normal rate, regular rhythm and normal pulses.   Murmur heard.  Systolic murmur is present with a grade of 2/6  No LE edema, palpable cords or masses. No erythema or increased warmth. No asymmetry in calf size when compared bilaterally LE hair growth symmetric and present. No discoloration of varicosities noted. LE warm and palpable pedal pulses. SEM II/VI, Loudest LSB, non radiating, no thrill   Pulmonary/Chest: Effort normal and breath sounds normal. She has no wheezes. She has no rhonchi. She has no  rales.  Neurological: She is alert.  Skin: Skin is warm and dry.  Psychiatric: She has a normal mood and affect. Her speech is normal and behavior is normal. Thought content normal.  Vitals reviewed.      Assessment & Plan:   Problem List Items Addressed This Visit      Other   Palpitations - Primary    H/o PVCs and PACs per patient. No chest pain, SOB to warrant higher level of care/ ED.   EKG: HR 59. AV block, no ischemia. When compared to prior EKGs, 11/2011 EKG showed normal sinus rhythm. 12/2012 shows atrial fibrillation. SEM. Pending echo for structural disease. Referral to cardiology as concern for patient to be on beta blocker with AV block.       Relevant Orders   ECHOCARDIOGRAM COMPLETE   EKG 12-Lead (Completed)   Ambulatory referral to Cardiology    Other Visit Diagnoses   None.       I have discontinued Ms. Mcloud's loratadine, nabumetone, and amitriptyline. I am also having her maintain her Polyethylene Glycol 3350 (MIRALAX PO), b complex vitamins, vitamin B-12, Vitamin D3, Omega-3 Fatty Acids (FISH OIL PO), vitamin C, ketoconazole, levalbuterol, acetaminophen, CoQ-10, Multiple Vitamins-Minerals (HAIR/SKIN/NAILS PO), traMADol, sucralfate, ipratropium, Fluticasone-Salmeterol, folic acid, meclizine, ZETIA, spironolactone, hydrocortisone, pantoprazole, verapamil, ramipril, traZODone, and bisoprolol.   No orders of the defined types were placed in this encounter.   Return precautions given.   Risks, benefits, and alternatives of the medications and treatment plan prescribed today were discussed, and patient expressed understanding.   Education regarding symptom management and diagnosis given to patient on AVS.  Continue to follow with [name], MD for routine health maintenance.   [name] and I agreed with plan.   Mable Paris, FNP

## 2016-05-29 NOTE — Patient Instructions (Addendum)
Concerns for heart block.  Pending Echo.   If there is no improvement in your symptoms, or if there is any worsening of symptoms, or if you have any additional concerns, please return for re-evaluation; or, if we are closed, consider going to the Emergency Room for evaluation if symptoms urgent.   Palpitations A palpitation is the feeling that your heartbeat is irregular or is faster than normal. It may feel like your heart is fluttering or skipping a beat. Palpitations are usually not a serious problem. However, in some cases, you may need further medical evaluation. CAUSES  Palpitations can be caused by:  Smoking.  Caffeine or other stimulants, such as diet pills or energy drinks.  Alcohol.  Stress and anxiety.  Strenuous physical activity.  Fatigue.  Certain medicines.  Heart disease, especially if you have a history of irregular heart rhythms (arrhythmias), such as atrial fibrillation, atrial flutter, or supraventricular tachycardia.  An improperly working pacemaker or defibrillator. DIAGNOSIS  To find the cause of your palpitations, your health care provider will take your medical history and perform a physical exam. Your health care provider may also have you take a test called an ambulatory electrocardiogram (ECG). An ECG records your heartbeat patterns over a 24-hour period. You may also have other tests, such as:  Transthoracic echocardiogram (TTE). During echocardiography, sound waves are used to evaluate how blood flows through your heart.  Transesophageal echocardiogram (TEE).  Cardiac monitoring. This allows your health care provider to monitor your heart rate and rhythm in real time.  Holter monitor. This is a portable device that records your heartbeat and can help diagnose heart arrhythmias. It allows your health care provider to track your heart activity for several days, if needed.  Stress tests by exercise or by giving medicine that makes the heart beat  faster. TREATMENT  Treatment of palpitations depends on the cause of your symptoms and can vary greatly. Most cases of palpitations do not require any treatment other than time, relaxation, and monitoring your symptoms. Other causes, such as atrial fibrillation, atrial flutter, or supraventricular tachycardia, usually require further treatment. HOME CARE INSTRUCTIONS   Avoid:  Caffeinated coffee, tea, soft drinks, diet pills, and energy drinks.  Chocolate.  Alcohol.  Stop smoking if you smoke.  Reduce your stress and anxiety. Things that can help you relax include:  A method of controlling things in your body, such as your heartbeats, with your mind (biofeedback).  Yoga.  Meditation.  Physical activity such as swimming, jogging, or walking.  Get plenty of rest and sleep. SEEK MEDICAL CARE IF:   You continue to have a fast or irregular heartbeat beyond 24 hours.  Your palpitations occur more often. SEEK IMMEDIATE MEDICAL CARE IF:  You have chest pain or shortness of breath.  You have a severe headache.  You feel dizzy or you faint. MAKE SURE YOU:  Understand these instructions.  Will watch your condition.  Will get help right away if you are not doing well or get worse.   This information is not intended to replace advice given to you by your health care provider. Make sure you discuss any questions you have with your health care provider.   Document Released: 09/21/2000 Document Revised: 09/29/2013 Document Reviewed: 11/23/2011 Elsevier Interactive Patient Education [employer].

## 2016-05-31 ENCOUNTER — Emergency Department: Payer: Medicare Other

## 2016-05-31 ENCOUNTER — Observation Stay
Admission: EM | Admit: 2016-05-31 | Discharge: 2016-06-02 | Disposition: A | Discharge status: Home / Self Care | Payer: Medicare Other | Attending: Internal Medicine | Admitting: Internal Medicine

## 2016-05-31 DIAGNOSIS — Z87440 Personal history of urinary (tract) infections: Secondary | ICD-10-CM | POA: Diagnosis not present

## 2016-05-31 DIAGNOSIS — Z96653 Presence of artificial knee joint, bilateral: Secondary | ICD-10-CM | POA: Insufficient documentation

## 2016-05-31 DIAGNOSIS — E669 Obesity, unspecified: Secondary | ICD-10-CM | POA: Insufficient documentation

## 2016-05-31 DIAGNOSIS — Z801 Family history of malignant neoplasm of trachea, bronchus and lung: Secondary | ICD-10-CM | POA: Insufficient documentation

## 2016-05-31 DIAGNOSIS — Z951 Presence of aortocoronary bypass graft: Secondary | ICD-10-CM | POA: Insufficient documentation

## 2016-05-31 DIAGNOSIS — Z79899 Other long term (current) drug therapy: Secondary | ICD-10-CM | POA: Diagnosis not present

## 2016-05-31 DIAGNOSIS — N189 Chronic kidney disease, unspecified: Secondary | ICD-10-CM | POA: Insufficient documentation

## 2016-05-31 DIAGNOSIS — M1990 Unspecified osteoarthritis, unspecified site: Secondary | ICD-10-CM | POA: Insufficient documentation

## 2016-05-31 DIAGNOSIS — I119 Hypertensive heart disease without heart failure: Secondary | ICD-10-CM | POA: Diagnosis present

## 2016-05-31 DIAGNOSIS — I7300 Raynaud's syndrome without gangrene: Secondary | ICD-10-CM | POA: Insufficient documentation

## 2016-05-31 DIAGNOSIS — M353 Polymyalgia rheumatica: Secondary | ICD-10-CM | POA: Insufficient documentation

## 2016-05-31 DIAGNOSIS — I2510 Atherosclerotic heart disease of native coronary artery without angina pectoris: Secondary | ICD-10-CM | POA: Diagnosis not present

## 2016-05-31 DIAGNOSIS — Z8711 Personal history of peptic ulcer disease: Secondary | ICD-10-CM | POA: Insufficient documentation

## 2016-05-31 DIAGNOSIS — Z87891 Personal history of nicotine dependence: Secondary | ICD-10-CM | POA: Insufficient documentation

## 2016-05-31 DIAGNOSIS — J45909 Unspecified asthma, uncomplicated: Secondary | ICD-10-CM | POA: Diagnosis not present

## 2016-05-31 DIAGNOSIS — E785 Hyperlipidemia, unspecified: Secondary | ICD-10-CM | POA: Insufficient documentation

## 2016-05-31 DIAGNOSIS — Z888 Allergy status to other drugs, medicaments and biological substances status: Secondary | ICD-10-CM | POA: Insufficient documentation

## 2016-05-31 DIAGNOSIS — Z881 Allergy status to other antibiotic agents status: Secondary | ICD-10-CM | POA: Insufficient documentation

## 2016-05-31 DIAGNOSIS — Z823 Family history of stroke: Secondary | ICD-10-CM | POA: Insufficient documentation

## 2016-05-31 DIAGNOSIS — K219 Gastro-esophageal reflux disease without esophagitis: Secondary | ICD-10-CM | POA: Insufficient documentation

## 2016-05-31 DIAGNOSIS — Z9842 Cataract extraction status, left eye: Secondary | ICD-10-CM | POA: Insufficient documentation

## 2016-05-31 DIAGNOSIS — I25118 Atherosclerotic heart disease of native coronary artery with other forms of angina pectoris: Secondary | ICD-10-CM | POA: Diagnosis present

## 2016-05-31 DIAGNOSIS — I129 Hypertensive chronic kidney disease with stage 1 through stage 4 chronic kidney disease, or unspecified chronic kidney disease: Secondary | ICD-10-CM | POA: Insufficient documentation

## 2016-05-31 DIAGNOSIS — Z8249 Family history of ischemic heart disease and other diseases of the circulatory system: Secondary | ICD-10-CM | POA: Insufficient documentation

## 2016-05-31 DIAGNOSIS — Z7951 Long term (current) use of inhaled steroids: Secondary | ICD-10-CM | POA: Diagnosis not present

## 2016-05-31 DIAGNOSIS — I6523 Occlusion and stenosis of bilateral carotid arteries: Secondary | ICD-10-CM | POA: Diagnosis not present

## 2016-05-31 DIAGNOSIS — I209 Angina pectoris, unspecified: Secondary | ICD-10-CM | POA: Diagnosis not present

## 2016-05-31 DIAGNOSIS — E7800 Pure hypercholesterolemia, unspecified: Secondary | ICD-10-CM | POA: Diagnosis present

## 2016-05-31 DIAGNOSIS — I10 Essential (primary) hypertension: Secondary | ICD-10-CM | POA: Diagnosis present

## 2016-05-31 DIAGNOSIS — I25110 Atherosclerotic heart disease of native coronary artery with unstable angina pectoris: Principal | ICD-10-CM | POA: Insufficient documentation

## 2016-05-31 DIAGNOSIS — Z6832 Body mass index (BMI) 32.0-32.9, adult: Secondary | ICD-10-CM | POA: Insufficient documentation

## 2016-05-31 DIAGNOSIS — E559 Vitamin D deficiency, unspecified: Secondary | ICD-10-CM | POA: Diagnosis not present

## 2016-05-31 DIAGNOSIS — M4800 Spinal stenosis, site unspecified: Secondary | ICD-10-CM | POA: Diagnosis not present

## 2016-05-31 DIAGNOSIS — Z8349 Family history of other endocrine, nutritional and metabolic diseases: Secondary | ICD-10-CM | POA: Insufficient documentation

## 2016-05-31 DIAGNOSIS — Z9841 Cataract extraction status, right eye: Secondary | ICD-10-CM | POA: Insufficient documentation

## 2016-05-31 DIAGNOSIS — Z85820 Personal history of malignant melanoma of skin: Secondary | ICD-10-CM | POA: Insufficient documentation

## 2016-05-31 DIAGNOSIS — Z9109 Other allergy status, other than to drugs and biological substances: Secondary | ICD-10-CM | POA: Insufficient documentation

## 2016-05-31 DIAGNOSIS — Z882 Allergy status to sulfonamides status: Secondary | ICD-10-CM | POA: Insufficient documentation

## 2016-05-31 DIAGNOSIS — Z880 Allergy status to penicillin: Secondary | ICD-10-CM | POA: Insufficient documentation

## 2016-05-31 DIAGNOSIS — Z86010 Personal history of colonic polyps: Secondary | ICD-10-CM | POA: Diagnosis not present

## 2016-05-31 DIAGNOSIS — I471 Supraventricular tachycardia: Secondary | ICD-10-CM | POA: Diagnosis not present

## 2016-05-31 DIAGNOSIS — L439 Lichen planus, unspecified: Secondary | ICD-10-CM | POA: Insufficient documentation

## 2016-05-31 DIAGNOSIS — Z9889 Other specified postprocedural states: Secondary | ICD-10-CM | POA: Insufficient documentation

## 2016-05-31 DIAGNOSIS — F329 Major depressive disorder, single episode, unspecified: Secondary | ICD-10-CM | POA: Diagnosis not present

## 2016-05-31 DIAGNOSIS — Z818 Family history of other mental and behavioral disorders: Secondary | ICD-10-CM | POA: Insufficient documentation

## 2016-05-31 DIAGNOSIS — Z886 Allergy status to analgesic agent status: Secondary | ICD-10-CM | POA: Insufficient documentation

## 2016-05-31 DIAGNOSIS — Z955 Presence of coronary angioplasty implant and graft: Secondary | ICD-10-CM | POA: Insufficient documentation

## 2016-05-31 DIAGNOSIS — R079 Chest pain, unspecified: Secondary | ICD-10-CM | POA: Diagnosis not present

## 2016-05-31 DIAGNOSIS — I440 Atrioventricular block, first degree: Secondary | ICD-10-CM | POA: Insufficient documentation

## 2016-05-31 DIAGNOSIS — Z96642 Presence of left artificial hip joint: Secondary | ICD-10-CM | POA: Insufficient documentation

## 2016-05-31 DIAGNOSIS — I200 Unstable angina: Secondary | ICD-10-CM | POA: Diagnosis present

## 2016-05-31 DIAGNOSIS — K449 Diaphragmatic hernia without obstruction or gangrene: Secondary | ICD-10-CM | POA: Insufficient documentation

## 2016-05-31 DIAGNOSIS — Z811 Family history of alcohol abuse and dependence: Secondary | ICD-10-CM | POA: Insufficient documentation

## 2016-05-31 DIAGNOSIS — F32A Depression, unspecified: Secondary | ICD-10-CM | POA: Diagnosis present

## 2016-05-31 HISTORY — DX: Hypertensive heart disease without heart failure: I11.9

## 2016-05-31 HISTORY — DX: Supraventricular tachycardia, unspecified: I47.10

## 2016-05-31 HISTORY — DX: Palpitations: R00.2

## 2016-05-31 HISTORY — DX: Supraventricular tachycardia: I47.1

## 2016-05-31 HISTORY — DX: Disorder of arteries and arterioles, unspecified: I77.9

## 2016-05-31 HISTORY — DX: Peripheral vascular disease, unspecified: I73.9

## 2016-05-31 LAB — CBC
HCT: 38.2 % (ref 35.0–47.0)
Hemoglobin: 13.2 g/dL (ref 12.0–16.0)
MCH: 34.7 pg — ABNORMAL HIGH (ref 26.0–34.0)
MCHC: 34.5 g/dL (ref 32.0–36.0)
MCV: 100.5 fL — ABNORMAL HIGH (ref 80.0–100.0)
PLATELETS: 212 10*3/uL (ref 150–440)
RBC: 3.8 MIL/uL (ref 3.80–5.20)
RDW: 12.6 % (ref 11.5–14.5)
WBC: 6.9 10*3/uL (ref 3.6–11.0)

## 2016-05-31 LAB — BASIC METABOLIC PANEL
ANION GAP: 7 (ref 5–15)
BUN: 21 mg/dL — ABNORMAL HIGH (ref 6–20)
CALCIUM: 9.6 mg/dL (ref 8.9–10.3)
CO2: 27 mmol/L (ref 22–32)
CREATININE: 0.82 mg/dL (ref 0.44–1.00)
Chloride: 101 mmol/L (ref 101–111)
Glucose, Bld: 111 mg/dL — ABNORMAL HIGH (ref 65–99)
Potassium: 4.1 mmol/L (ref 3.5–5.1)
SODIUM: 135 mmol/L (ref 135–145)

## 2016-05-31 LAB — TROPONIN I: Troponin I: <0.03 ng/mL (ref <0.03)

## 2016-05-31 MED ORDER — VERAPAMIL HCL ER 180 MG PO TBCR
180.0000 mg | EXTENDED_RELEASE_TABLET | Freq: Two times a day (BID) | ORAL | Status: DC
  Administered 2016-06-01 – 2016-06-02 (×3): 180 mg via ORAL
  Filled 2016-05-31 (×4): qty 1

## 2016-05-31 MED ORDER — RAMIPRIL 10 MG PO CAPS
10.0000 mg | ORAL_CAPSULE | Freq: Every day | ORAL | Status: DC
  Administered 2016-06-01: 10 mg via ORAL
  Filled 2016-05-31: qty 1

## 2016-05-31 NOTE — ED Triage Notes (Signed)
Pt in with co midsternal chest pain since today hx of ACS and bypass years ago. States does have shob at times, none noted.

## 2016-05-31 NOTE — H&P (Signed)
[facility] NAME: Charlotte Wagner    MR#:  [id number]  DATE OF BIRTH:  Aug 01, 1936  DATE OF ADMISSION:  05/31/2016  PRIMARY CARE PHYSICIAN: [name], MD   REQUESTING/REFERRING PHYSICIAN: Darl Householder, MD  CHIEF COMPLAINT:   Chief Complaint  Patient presents with  . Chest Pain    HISTORY OF PRESENT ILLNESS:  Charlotte Wagner  is a 80 y.o. female who presents with an episode of chest pain.  Patient has a known history of CAD, including previous coronary bypass surgery. She states that last several days she's been having intermittent chest discomfort with exertion. It is mild, improves with rest. It does not radiate, and there are no other significantly associated exacerbating factors. Lab workup initially here in the ED is benign, but given her history cardiology was called by ED physician and recommended she be kept tonight with further evaluation. Hospitalists were called for admission  PAST MEDICAL HISTORY:   Past Medical History:  Diagnosis Date  . Allergy   . Arrhythmia    rare PVC's, SVT  . Arthritis    s/p bilateral knees and left hip replacement  . Asthma   . CAD (coronary artery disease)    s/p CABG 12/1996  . Cancer ([place])    melanoma right arm  . Chronic kidney disease   . Colon polyps    H/O  . Depression   . GERD (gastroesophageal reflux disease)    h/o hiatal hernia  . Headache    migraines in past  . Heart murmur   . History of chicken pox   . History of hiatal hernia   . Hx of migraines    rare now  . Hx: UTI (urinary tract infection)   . Hyperlipidemia   . Hypertension   . Lichen planus   . Melanoma ([place])   . PMR (polymyalgia rheumatica) (HCC)    h/o  . PUD (peptic ulcer disease)    remote history  . Raynaud's phenomenon   . Spinal stenosis   . Urine incontinence    H/O  . Vitamin D deficiency     PAST SURGICAL HISTORY:   Past Surgical History:  Procedure Laterality Date  . ADENOIDECTOMY      age 9  . BACK SURGERY     L3-L5  . BILATERAL CARPAL TUNNEL RELEASE    . BREAST BIOPSY Right    bx x 3 neg  . BREAST SURGERY Right    biopsy x 3 (all benign)  . CATARACT EXTRACTION W/PHACO Right 01/04/2016   Procedure: CATARACT EXTRACTION PHACO AND INTRAOCULAR LENS PLACEMENT (IOC);  Surgeon: Leandrew Koyanagi, MD;  Location: [facility];  Service: Ophthalmology;  Laterality: Right;  MALYUGIN  . CATARACT EXTRACTION W/PHACO Left 01/25/2016   Procedure: CATARACT EXTRACTION PHACO AND INTRAOCULAR LENS PLACEMENT ([place]) left eye;  Surgeon: Leandrew Koyanagi, MD;  Location: [facility];  Service: Ophthalmology;  Laterality: Left;  MALYUGIN SHUGARCAINE  . CHOLECYSTECTOMY  90's  . COLONOSCOPY WITH PROPOFOL N/A 05/03/2015   Procedure: COLONOSCOPY WITH PROPOFOL;  Surgeon: Lollie Sails, MD;  Location: [facility] ENDOSCOPY;  Service: Endoscopy;  Laterality: N/A;  . CORONARY ARTERY BYPASS GRAFT  98   4 vessel  . ESOPHAGOGASTRODUODENOSCOPY N/A 03/22/2015   Procedure: ESOPHAGOGASTRODUODENOSCOPY (EGD);  Surgeon: Lollie Sails, MD;  Location: [facility] ENDOSCOPY;  Service: Endoscopy;  Laterality: N/A;  . JOINT REPLACEMENT     BILATERAL KNEE REPLACEMENTS  . KNEE ARTHROSCOPY W/ OATS PROCEDURE  Lt knee (9/01), Rt knee (3/11), Lt hip (5/10)  . TOTAL HIP ARTHROPLASTY      SOCIAL HISTORY:   Social History  Substance Use Topics  . Smoking status: Former Research scientist (life sciences)  . Smokeless tobacco: Never Used     Comment: quit 44+ yrs ago  . Alcohol use No    FAMILY HISTORY:   Family History  Problem Relation Age of Onset  . Thyroid disease Mother     graves disease  . Heart disease Father     rheumatic heart  . Alcohol abuse Father   . Depression Father   . Lung cancer Sister   . Depression Daughter   . Thyroid disease Daughter     hashimoto  . Depression Son   . Stroke Maternal Grandmother   . Depression Maternal Grandmother   . Hypertension Maternal Grandfather   . Hypertension  Paternal Grandfather   . Breast cancer Neg Hx     DRUG ALLERGIES:   Allergies  Allergen Reactions  . Beta Adrenergic Blockers Shortness Of Breath  . Albuterol     rigors  . Aspirin Other (See Comments)    Heartburn   . Penicillins Hives  . Statins Other (See Comments)    Muscle weakness  . Tetracyclines & Related   . Sulfa Antibiotics Rash    At mouth  . Tetracycline Rash    MEDICATIONS AT HOME:   Prior to Admission medications   Medication Sig Start Date End Date Taking? Authorizing Provider  acetaminophen (TYLENOL) 500 MG tablet Take 1,000 mg by mouth every 6 (six) hours as needed for moderate pain or headache.   Yes Historical Provider, MD  amitriptyline (ELAVIL) 25 MG tablet Take 25 mg by mouth at bedtime.   Yes Historical Provider, MD  b complex vitamins tablet Take 1 tablet by mouth daily.   Yes Historical Provider, MD  bisoprolol (ZEBETA) 5 MG tablet Take 2.5 mg by mouth at bedtime.   Yes Historical Provider, MD  Cholecalciferol (VITAMIN D3) 2000 UNITS TABS Take 1 capsule by mouth daily.    Yes Historical Provider, MD  Coenzyme Q10 (COQ-10) 100 MG CAPS Take 1 tablet by mouth daily.   Yes Historical Provider, MD  ezetimibe (ZETIA) 10 MG tablet Take 10 mg by mouth at bedtime.   Yes Historical Provider, MD  Fluticasone-Salmeterol (ADVAIR DISKUS) 250-50 MCG/DOSE AEPB Inhale 1 puff into the lungs 2 (two) times daily. Patient taking differently: Inhale 1 puff into the lungs as needed (cough).  11/10/15  Yes Leone Haven, MD  folic acid (FOLVITE) 1 MG tablet take 1 tablet by mouth once daily 12/05/15  Yes [name], MD  hydrocortisone 2.5 % cream apply to VULVA twice a day AT 10AM AND 4 PM 02/06/16  Yes [name], MD  ipratropium (ATROVENT HFA) 17 MCG/ACT inhaler Inhale 2 puffs into the lungs every 6 (six) hours as needed for wheezing. Patient taking differently: Inhale 2 puffs into the lungs as needed for wheezing.  11/10/15  Yes Leone Haven, MD  ketoconazole  (NIZORAL) 2 % cream Apply 1 application topically daily. Patient taking differently: Apply 1 application topically daily as needed for irritation.  06/15/14  Yes [name], MD  loratadine (CLARITIN) 10 MG tablet Take 10 mg by mouth daily.   Yes Historical Provider, MD  meclizine (ANTIVERT) 25 MG tablet Take 25 mg by mouth 3 (three) times daily as needed for dizziness.   Yes Historical Provider, MD  miconazole (MICOTIN) 2 % cream Apply  1 application topically 2 (two) times daily as needed (irritation).   Yes Historical Provider, MD  Multiple Vitamins-Minerals (HAIR/SKIN/NAILS PO) Take 1 tablet by mouth daily.   Yes Historical Provider, MD  nabumetone (RELAFEN) 750 MG tablet Take 750 mg by mouth at bedtime.   Yes Historical Provider, MD  Omega-3 Fatty Acids (FISH OIL PO) Take 2,000 mg by mouth daily.   Yes Historical Provider, MD  pantoprazole (PROTONIX) 40 MG tablet Take 1 tablet (40 mg total) by mouth 2 (two) times daily. 02/24/16  Yes [name], MD  Polyethylene Glycol 3350 (MIRALAX PO) Take 8.5 g by mouth at bedtime.    Yes Historical Provider, MD  ramipril (ALTACE) 10 MG capsule Take 10 mg by mouth at bedtime.   Yes Historical Provider, MD  spironolactone (ALDACTONE) 25 MG tablet take 1 tablet by mouth every morning 01/30/16  Yes [name], MD  sucralfate (CARAFATE) 1 g tablet Take 1 g by mouth 2 (two) times daily as needed (acid reflux).   Yes Historical Provider, MD  traMADol (ULTRAM) 50 MG tablet Take 1 tablet (50 mg total) by mouth daily as needed. Patient taking differently: Take 50 mg by mouth daily as needed for moderate pain.  08/16/15  Yes [name], MD  traZODone (DESYREL) 150 MG tablet Take 250 mg by mouth at bedtime. Patient takes 1 and 2/3 tablets   Yes Historical Provider, MD  verapamil (CALAN-SR) 180 MG CR tablet take 1 tablet by mouth twice a day 03/08/16  Yes [name], MD  vitamin B-12 (CYANOCOBALAMIN) 1000 MCG tablet Take 1,000 mcg by mouth daily.   Yes  Historical Provider, MD  vitamin C (ASCORBIC ACID) 500 MG tablet Take 500 mg by mouth at bedtime.   Yes Historical Provider, MD    REVIEW OF SYSTEMS:  Review of Systems  Constitutional: Negative for chills, fever, malaise/fatigue and weight loss.  HENT: Negative for ear pain, hearing loss and tinnitus.   Eyes: Negative for blurred vision, double vision, pain and redness.  Respiratory: Negative for cough, hemoptysis and shortness of breath.   Cardiovascular: Positive for chest pain. Negative for palpitations, orthopnea and leg swelling.  Gastrointestinal: Negative for abdominal pain, constipation, diarrhea, nausea and vomiting.  Genitourinary: Negative for dysuria, frequency and hematuria.  Musculoskeletal: Negative for back pain, joint pain and neck pain.  Skin:       No acne, rash, or lesions  Neurological: Negative for dizziness, tremors, focal weakness and weakness.  Endo/Heme/Allergies: Negative for polydipsia. Does not bruise/bleed easily.  Psychiatric/Behavioral: Negative for depression. The patient is not nervous/anxious and does not have insomnia.      VITAL SIGNS:   Vitals:   05/31/16 1950 05/31/16 1951  BP: (!) 157/59   Pulse: (!) 59   Resp: 18   Temp: 98.4 F (36.9 C)   TempSrc: Oral   SpO2: 98%   Weight:  83.9 kg (185 lb)  Height:  5\' 3"  (1.6 m)   Wt Readings from Last 3 Encounters:  05/31/16 83.9 kg (185 lb)  05/29/16 84.8 kg (187 lb)  02/23/16 85.8 kg (189 lb 4 oz)    PHYSICAL EXAMINATION:  Physical Exam  Vitals reviewed. Constitutional: She is oriented to person, place, and time. She appears well-developed and well-nourished. No distress.  HENT:  Head: Normocephalic and atraumatic.  Mouth/Throat: Oropharynx is clear and moist.  Eyes: Conjunctivae and EOM are normal. Pupils are equal, round, and reactive to light. No scleral icterus.  Neck: Normal range of motion. Neck supple.  No JVD present. No thyromegaly present.  Cardiovascular: Normal rate, regular  rhythm and intact distal pulses.  Exam reveals no gallop and no friction rub.   No murmur heard. Respiratory: Effort normal and breath sounds normal. No respiratory distress. She has no wheezes. She has no rales.  GI: Soft. Bowel sounds are normal. She exhibits no distension. There is no tenderness.  Musculoskeletal: Normal range of motion. She exhibits no edema.  No arthritis, no gout  Lymphadenopathy:    She has no cervical adenopathy.  Neurological: She is alert and oriented to person, place, and time. No cranial nerve deficit.  No dysarthria, no aphasia  Skin: Skin is warm and dry. No rash noted. No erythema.  Psychiatric: She has a normal mood and affect. Her behavior is normal. Judgment and thought content normal.    LABORATORY PANEL:   CBC  Recent Labs Lab 05/31/16 1954  WBC 6.9  HGB 13.2  HCT 38.2  PLT 212   ------------------------------------------------------------------------------------------------------------------  Chemistries   Recent Labs Lab 05/31/16 1954  NA 135  K 4.1  CL 101  CO2 27  GLUCOSE 111*  BUN 21*  CREATININE 0.82  CALCIUM 9.6   ------------------------------------------------------------------------------------------------------------------  Cardiac Enzymes  Recent Labs Lab 05/31/16 1954  TROPONINI <0.03   ------------------------------------------------------------------------------------------------------------------  RADIOLOGY:  Dg Chest 2 View  Result Date: 05/31/2016 CLINICAL DATA:  Chest pain EXAM: CHEST  2 VIEW COMPARISON:  None. FINDINGS: The heart size is borderline. The hila and mediastinum are normal. No pulmonary nodules, masses, or focal infiltrates. No acute abnormalities identified. IMPRESSION: No active cardiopulmonary disease. Electronically Signed   By: Dorise Bullion III M.D   On: 05/31/2016 20:07    EKG:   Orders placed or performed during the hospital encounter of 05/31/16  . EKG 12-Lead  . EKG 12-Lead   . ED EKG  . ED EKG    IMPRESSION AND PLAN:  Principal Problem:   Angina pectoris ([place]) - trend cardiac enzymes tonight, get echocardiogram tomorrow and a cardiology consult. Active Problems:   CAD (coronary artery disease) - continue home meds, other workup as above   Essential hypertension, benign - continue home meds   GERD (gastroesophageal reflux disease) - home dose PPI   Hypercholesterolemia - continue home anti-lipids  All the records are reviewed and case discussed with ED provider. Management plans discussed with the patient and/or family.  DVT PROPHYLAXIS: SubQ lovenox  GI PROPHYLAXIS: PPI  ADMISSION STATUS: Observation  CODE STATUS: Full Code Status History    This patient does not have a recorded code status. Please follow your organizational policy for patients in this situation.      TOTAL TIME TAKING CARE OF THIS PATIENT: 40 minutes.    Keng Jewel FIELDING 05/31/2016, 11:07 PM  [facility]  Office  [phone]  CC: Primary care physician; [name], MD

## 2016-05-31 NOTE — ED Provider Notes (Signed)
[facility] Provider Note   CSN: [id number] Arrival date & time: 05/31/16  1939     History   Chief Complaint Chief Complaint  Patient presents with  . Chest Pain    HPI RATZY KINGSMORE is a 80 y.o. female hx of CAD with CABG in 1998, HTN, PVCs here with chest pain, shortness of breath. 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 She states that she lives in assisted living currently. She is at baseline very active and walks about half a mile a day. She states that over the last week or so she's been getting more short of breath. She was walking down the hallway today and had some shortness of breath and chest pain with exertion. She also was trying to fold her laundry episode of chest pain the last several seconds as well. Also noticed that she has more frequent PVCs as well. She states that before she had her CABG in 1998, she had similar symptoms. Last stress echo was 2005. Hasn't seen cardiology in [facility] yet.     The history is provided by the patient.    Past Medical History:  Diagnosis Date  . Allergy   . Arrhythmia    rare PVC's, SVT  . Arthritis    s/p bilateral knees and left hip replacement  . Asthma   . CAD (coronary artery disease)    s/p CABG 12/1996  . Cancer ([place])    melanoma right arm  . Chronic kidney disease   . Colon polyps    H/O  . Depression   . GERD (gastroesophageal reflux disease)    h/o hiatal hernia  . Headache    migraines in past  . Heart murmur   . History of chicken pox   . History of hiatal hernia   . Hx of migraines    rare now  . Hx: UTI (urinary tract infection)   . Hyperlipidemia   . Hypertension   . Lichen planus   . Melanoma ([place])   . PMR (polymyalgia rheumatica) (HCC)    h/o  . PUD (peptic ulcer disease)    remote history  . Raynaud's phenomenon   . Spinal stenosis   . Urine incontinence    H/O  . Vitamin D deficiency     Patient Active  Problem List   Diagnosis Date Noted  . Palpitations 05/29/2016  . Viral URI with cough 06/16/2015  . Left carotid bruit 04/18/2015  . Health care maintenance 04/18/2015  . UTI (urinary tract infection) 02/07/2015  . Viral syndrome 01/04/2015  . Dysuria 01/04/2015  . Dysphagia 12/21/2014  . Arm skin lesion, right 12/14/2013  . Acute bronchitis 10/18/2013  . CAD (coronary artery disease) 06/21/2013  . History of colonic polyps 06/21/2013  . Vitamin D deficiency 06/21/2013  . Osteoarthritis 06/21/2013  . Essential hypertension, benign 06/21/2013  . GERD (gastroesophageal reflux disease) 06/21/2013  . Hiatal hernia 06/21/2013  . Goiter 06/21/2013  . Lichen planus [id number]  . Depression 06/21/2013  . Hypercholesterolemia 06/21/2013  . Spinal stenosis 06/21/2013  . Asthma 06/21/2013  . Migraine 06/21/2013  . Frequent UTI 06/21/2013  . Hemorrhoids 06/21/2013    Past Surgical History:  Procedure Laterality Date  . ADENOIDECTOMY     age 59  . BACK SURGERY     L3-L5  . BILATERAL CARPAL TUNNEL RELEASE    . BREAST BIOPSY Right    bx x 3 neg  .  BREAST SURGERY Right    biopsy x 3 (all benign)  . CATARACT EXTRACTION W/PHACO Right 01/04/2016   Procedure: CATARACT EXTRACTION PHACO AND INTRAOCULAR LENS PLACEMENT (IOC);  Surgeon: Leandrew Koyanagi, MD;  Location: [facility];  Service: Ophthalmology;  Laterality: Right;  MALYUGIN  . CATARACT EXTRACTION W/PHACO Left 01/25/2016   Procedure: CATARACT EXTRACTION PHACO AND INTRAOCULAR LENS PLACEMENT ([place]) left eye;  Surgeon: Leandrew Koyanagi, MD;  Location: [facility];  Service: Ophthalmology;  Laterality: Left;  MALYUGIN SHUGARCAINE  . CHOLECYSTECTOMY  90's  . COLONOSCOPY WITH PROPOFOL N/A 05/03/2015   Procedure: COLONOSCOPY WITH PROPOFOL;  Surgeon: Lollie Sails, MD;  Location: [facility] ENDOSCOPY;  Service: Endoscopy;  Laterality: N/A;  . CORONARY ARTERY BYPASS GRAFT  98   4 vessel  . ESOPHAGOGASTRODUODENOSCOPY N/A  03/22/2015   Procedure: ESOPHAGOGASTRODUODENOSCOPY (EGD);  Surgeon: Lollie Sails, MD;  Location[facility] ENDOSCOPY;  Service: Endoscopy;  Laterality: N/A;  . JOINT REPLACEMENT     BILATERAL KNEE REPLACEMENTS  . KNEE ARTHROSCOPY W/ OATS PROCEDURE     Lt knee (9/01), Rt knee (3/11), Lt hip (5/10)  . TOTAL HIP ARTHROPLASTY      OB History    No data available       Home Medications    Prior to Admission medications   Medication Sig Start Date End Date Taking? Authorizing Provider  acetaminophen (TYLENOL) 500 MG tablet Take 1,000 mg by mouth every 6 (six) hours as needed for moderate pain or headache.    Historical Provider, MD  b complex vitamins tablet Take 1 tablet by mouth daily.    Historical Provider, MD  bisoprolol (ZEBETA) 5 MG tablet TAKE 1/2 TABLET BY MOUTH ONCE DAILY AS DIRECTED 04/27/16   [name], MD  Cholecalciferol (VITAMIN D3) 2000 UNITS TABS Take 1 capsule by mouth daily.     Historical Provider, MD  Coenzyme Q10 (COQ-10) 100 MG CAPS Take 1 tablet by mouth daily.    Historical Provider, MD  Fluticasone-Salmeterol (ADVAIR DISKUS) 250-50 MCG/DOSE AEPB Inhale 1 puff into the lungs 2 (two) times daily. Patient taking differently: Inhale 1 puff into the lungs as needed.  11/10/15   Leone Haven, MD  folic acid (FOLVITE) 1 MG tablet take 1 tablet by mouth once daily 12/05/15   [name], MD  hydrocortisone 2.5 % cream apply to VULVA twice a day AT 10AM AND 4 PM 02/06/16   [name], MD  ipratropium (ATROVENT HFA) 17 MCG/ACT inhaler Inhale 2 puffs into the lungs every 6 (six) hours as needed for wheezing. Patient taking differently: Inhale 2 puffs into the lungs as needed for wheezing.  11/10/15   Leone Haven, MD  ketoconazole (NIZORAL) 2 % cream Apply 1 application topically daily. 06/15/14   [name], MD  levalbuterol [facility] HFA) 45 MCG/ACT inhaler Inhale 2 puffs into the lungs every 6 (six) hours as needed for wheezing. Patient not taking: Reported  on 05/29/2016 08/16/14   [name], MD  meclizine (ANTIVERT) 25 MG tablet Take 25 mg by mouth 3 (three) times daily as needed for dizziness.    Historical Provider, MD  Multiple Vitamins-Minerals (HAIR/SKIN/NAILS PO) Take 1 tablet by mouth daily.    Historical Provider, MD  Omega-3 Fatty Acids (FISH OIL PO) Take 2,000 mg by mouth daily.    Historical Provider, MD  pantoprazole (PROTONIX) 40 MG tablet Take 1 tablet (40 mg total) by mouth 2 (two) times daily. 02/24/16   [name], MD  Polyethylene Glycol [address] [facility]  PO) Take 8.5 g by mouth at bedtime.     Historical Provider, MD  ramipril (ALTACE) 10 MG capsule take 1 capsule by mouth once daily 03/15/16   [name], MD  spironolactone (ALDACTONE) 25 MG tablet take 1 tablet by mouth every morning 01/30/16   [name], MD  sucralfate (CARAFATE) 1 g tablet take 1 tablet by mouth twice a day if needed 10/07/15   [name], MD  traMADol (ULTRAM) 50 MG tablet Take 1 tablet (50 mg total) by mouth daily as needed. 08/16/15   [name], MD  traZODone (DESYREL) 150 MG tablet TAKE 1 AND 2/3 TABLETS TO 2 TABLET BY MOUTH AT BEDTIME AS DIRECTED 04/17/16   [name], MD  verapamil (CALAN-SR) 180 MG CR tablet take 1 tablet by mouth twice a day 03/08/16   [name], MD  vitamin B-12 (CYANOCOBALAMIN) 1000 MCG tablet Take 1,000 mcg by mouth daily.    Historical Provider, MD  vitamin C (ASCORBIC ACID) 500 MG tablet Take 500 mg by mouth daily.    Historical Provider, MD  ZETIA 10 MG tablet take 1 tablet by mouth once daily 01/04/16   [name], MD    Family History Family History  Problem Relation Age of Onset  . Thyroid disease Mother     graves disease  . Heart disease Father     rheumatic heart  . Alcohol abuse Father   . Depression Father   . Lung cancer Sister   . Depression Daughter   . Thyroid disease Daughter     hashimoto  . Depression Son   . Stroke Maternal Grandmother   . Depression Maternal Grandmother   .  Hypertension Maternal Grandfather   . Hypertension Paternal Grandfather   . Breast cancer Neg Hx     Social History Social History  Substance Use Topics  . Smoking status: Former Research scientist (life sciences)  . Smokeless tobacco: Never Used     Comment: quit 44+ yrs ago  . Alcohol use No     Allergies   Beta adrenergic blockers; Albuterol; Aspirin; Penicillins; Statins; Tetracyclines & related; Sulfa antibiotics; and Tetracycline   Review of Systems Review of Systems  Respiratory: Positive for shortness of breath.   Cardiovascular: Positive for chest pain.  All other systems reviewed and are negative.    Physical Exam Updated Vital Signs BP (!) 157/59 (BP Location: Left Arm)   Pulse (!) 59   Temp 98.4 F (36.9 C) (Oral)   Resp 18   Ht 5\' 3"  (1.6 m)   Wt 185 lb (83.9 kg)   SpO2 98%   BMI 32.77 kg/m   Physical Exam  Constitutional: She is oriented to person, place, and time. She appears well-developed and well-nourished.  HENT:  Head: Normocephalic.  Eyes: Pupils are equal, round, and reactive to light.  Neck: Normal range of motion. Neck supple.  Cardiovascular: Normal rate, regular rhythm and normal heart sounds.   Pulmonary/Chest: Effort normal and breath sounds normal.  Abdominal: Soft. Bowel sounds are normal.  Musculoskeletal: Normal range of motion.  Neurological: She is alert and oriented to person, place, and time.  Skin: Skin is warm.  Psychiatric: She has a normal mood and affect.  Nursing note and vitals reviewed.    ED Treatments / Results  Labs (all labs ordered are listed, but only abnormal results are displayed) Labs Reviewed  CBC - Abnormal; Notable for the following:       Result Value   MCV 100.5 (*)  MCH 34.7 (*)    All other components within normal limits  BASIC METABOLIC PANEL - Abnormal; Notable for the following:    Glucose, Bld 111 (*)    BUN 21 (*)    All other components within normal limits  TROPONIN I    EKG  EKG Interpretation None       ED ECG REPORT I, Wandra Arthurs, the attending physician, personally viewed and interpreted this ECG.   Date: 05/31/2016  EKG Time: 19:49 pm  Rate: 56  Rhythm: normal EKG, normal sinus rhythm  Axis: normal  Intervals:none  ST&T Change: TWI inferiorly    Radiology Dg Chest 2 View  Result Date: 05/31/2016 CLINICAL DATA:  Chest pain EXAM: CHEST  2 VIEW COMPARISON:  None. FINDINGS: The heart size is borderline. The hila and mediastinum are normal. No pulmonary nodules, masses, or focal infiltrates. No acute abnormalities identified. IMPRESSION: No active cardiopulmonary disease. Electronically Signed   By: Dorise Bullion III M.D   On: 05/31/2016 20:07    Procedures Procedures (including critical care time)  Medications Ordered in ED Medications - No data to display   Initial Impression / Assessment and Plan / ED Course  I have reviewed the triage vital signs and the nursing notes.  Pertinent labs & imaging results that were available during my care of the patient were reviewed by me and considered in my medical decision making (see chart for details).  Clinical Course   NYESHIA TAVANI is a 80 y.o. female here with chest pain, shortness of breath with exertion that is worsening. Hx of CABG with no recent stress test or angiogram. I have high suspicion for ACS or unstable angina. EKG showed TWI that was new since 2014. Trop neg x 1. Consulted Dr. Ubaldo Glassing, who will see patient in the morning. Hospitalist to admit to trend troponin. She refused nitro and ASA and states that ASA makes her nauseated.    Final Clinical Impressions(s) / ED Diagnoses   Final diagnoses:  None    New Prescriptions New Prescriptions   No medications on file     Drenda Freeze, MD 05/31/16 [id number]

## 2016-06-01 ENCOUNTER — Other Ambulatory Visit: Payer: Medicare Other

## 2016-06-01 ENCOUNTER — Observation Stay: Admit: 2016-06-01 | Payer: Medicare Other

## 2016-06-01 ENCOUNTER — Encounter
Admission: EM | Disposition: A | Discharge status: Home / Self Care | Payer: Self-pay | Source: Home / Self Care | Attending: Emergency Medicine

## 2016-06-01 DIAGNOSIS — E785 Hyperlipidemia, unspecified: Secondary | ICD-10-CM | POA: Diagnosis present

## 2016-06-01 DIAGNOSIS — K219 Gastro-esophageal reflux disease without esophagitis: Secondary | ICD-10-CM | POA: Diagnosis not present

## 2016-06-01 DIAGNOSIS — I209 Angina pectoris, unspecified: Secondary | ICD-10-CM | POA: Diagnosis not present

## 2016-06-01 DIAGNOSIS — I10 Essential (primary) hypertension: Secondary | ICD-10-CM | POA: Diagnosis not present

## 2016-06-01 DIAGNOSIS — I119 Hypertensive heart disease without heart failure: Secondary | ICD-10-CM | POA: Diagnosis present

## 2016-06-01 DIAGNOSIS — I25710 Atherosclerosis of autologous vein coronary artery bypass graft(s) with unstable angina pectoris: Secondary | ICD-10-CM

## 2016-06-01 DIAGNOSIS — I25110 Atherosclerotic heart disease of native coronary artery with unstable angina pectoris: Secondary | ICD-10-CM | POA: Diagnosis not present

## 2016-06-01 DIAGNOSIS — I2510 Atherosclerotic heart disease of native coronary artery without angina pectoris: Secondary | ICD-10-CM | POA: Diagnosis not present

## 2016-06-01 DIAGNOSIS — I200 Unstable angina: Secondary | ICD-10-CM | POA: Diagnosis not present

## 2016-06-01 HISTORY — PX: CARDIAC CATHETERIZATION: SHX172

## 2016-06-01 LAB — BASIC METABOLIC PANEL
Anion gap: 6 (ref 5–15)
BUN: 20 mg/dL (ref 6–20)
CALCIUM: 9.3 mg/dL (ref 8.9–10.3)
CO2: 28 mmol/L (ref 22–32)
CREATININE: 0.83 mg/dL (ref 0.44–1.00)
Chloride: 104 mmol/L (ref 101–111)
GFR calc Af Amer: >60 mL/min (ref >60)
GLUCOSE: 98 mg/dL (ref 65–99)
Potassium: 4.1 mmol/L (ref 3.5–5.1)
Sodium: 138 mmol/L (ref 135–145)

## 2016-06-01 LAB — TROPONIN I

## 2016-06-01 LAB — CBC
HCT: 36.1 % (ref 35.0–47.0)
HEMATOCRIT: 36.9 % (ref 35.0–47.0)
HEMOGLOBIN: 13.2 g/dL (ref 12.0–16.0)
Hemoglobin: 12.7 g/dL (ref 12.0–16.0)
MCH: 34.9 pg — AB (ref 26.0–34.0)
MCH: 35.2 pg — AB (ref 26.0–34.0)
MCHC: 35.3 g/dL (ref 32.0–36.0)
MCHC: 35.7 g/dL (ref 32.0–36.0)
MCV: 98.9 fL (ref 80.0–100.0)
MCV: 98.8 fL (ref 80.0–100.0)
Platelets: 194 10*3/uL (ref 150–440)
Platelets: 190 10*3/uL (ref 150–440)
RBC: 3.65 MIL/uL — ABNORMAL LOW (ref 3.80–5.20)
RBC: 3.73 MIL/uL — ABNORMAL LOW (ref 3.80–5.20)
RDW: 12.6 % (ref 11.5–14.5)
RDW: 12.4 % (ref 11.5–14.5)
WBC: 5.3 10*3/uL (ref 3.6–11.0)
WBC: 4.2 10*3/uL (ref 3.6–11.0)

## 2016-06-01 LAB — MRSA PCR SCREENING: MRSA BY PCR: NEGATIVE

## 2016-06-01 LAB — CREATININE, SERUM
CREATININE: 0.77 mg/dL (ref 0.44–1.00)
GFR calc Af Amer: >60 mL/min (ref >60)
GFR calc non Af Amer: >60 mL/min (ref >60)

## 2016-06-01 SURGERY — LEFT HEART CATH AND CORS/GRAFTS ANGIOGRAPHY
Anesthesia: Moderate Sedation

## 2016-06-01 MED ORDER — MIDAZOLAM HCL 2 MG/2ML IJ SOLN
INTRAMUSCULAR | Status: AC
  Filled 2016-06-01: qty 2

## 2016-06-01 MED ORDER — TICAGRELOR 90 MG PO TABS
90.0000 mg | ORAL_TABLET | Freq: Two times a day (BID) | ORAL | Status: DC
  Administered 2016-06-01 – 2016-06-02 (×2): 90 mg via ORAL
  Filled 2016-06-01 (×2): qty 1

## 2016-06-01 MED ORDER — EZETIMIBE 10 MG PO TABS
10.0000 mg | ORAL_TABLET | Freq: Every day | ORAL | Status: DC
  Administered 2016-06-01 – 2016-06-02 (×2): 10 mg via ORAL
  Filled 2016-06-01 (×2): qty 1

## 2016-06-01 MED ORDER — SODIUM CHLORIDE 0.9% FLUSH
3.0000 mL | Freq: Two times a day (BID) | INTRAVENOUS | Status: DC
  Administered 2016-06-01 (×2): 3 mL via INTRAVENOUS

## 2016-06-01 MED ORDER — SODIUM CHLORIDE 0.9 % IV SOLN: 250.0000 mL | INTRAVENOUS | Status: DC | PRN

## 2016-06-01 MED ORDER — SODIUM CHLORIDE 0.9 % IV SOLN
INTRAVENOUS | Status: AC
  Administered 2016-06-01: 13:00:00 via INTRAVENOUS

## 2016-06-01 MED ORDER — FENTANYL CITRATE (PF) 100 MCG/2ML IJ SOLN
INTRAMUSCULAR | Status: DC | PRN
  Administered 2016-06-01: 25 ug via INTRAVENOUS

## 2016-06-01 MED ORDER — BIVALIRUDIN BOLUS VIA INFUSION - CUPID
INTRAVENOUS | Status: DC | PRN
  Administered 2016-06-01: 62.925 mg via INTRAVENOUS

## 2016-06-01 MED ORDER — HEPARIN SODIUM (PORCINE) 1000 UNIT/ML IJ SOLN
INTRAMUSCULAR | Status: AC
  Filled 2016-06-01: qty 1

## 2016-06-01 MED ORDER — FENTANYL CITRATE (PF) 100 MCG/2ML IJ SOLN
INTRAMUSCULAR | Status: AC
  Filled 2016-06-01: qty 2

## 2016-06-01 MED ORDER — POLYETHYLENE GLYCOL 3350 17 G PO PACK
17.0000 g | PACK | Freq: Every day | ORAL | Status: DC
  Administered 2016-06-01: 17 g via ORAL
  Filled 2016-06-01 (×2): qty 1

## 2016-06-01 MED ORDER — HEPARIN (PORCINE) IN NACL 2-0.9 UNIT/ML-% IJ SOLN
INTRAMUSCULAR | Status: AC
Start: 2016-06-01 — End: 2016-06-01
  Filled 2016-06-01: qty 500

## 2016-06-01 MED ORDER — SODIUM CHLORIDE 0.9 % IV SOLN
INTRAVENOUS | Status: DC | PRN
  Administered 2016-06-01: 1.75 mg/kg/h via INTRAVENOUS

## 2016-06-01 MED ORDER — BISOPROLOL FUMARATE 5 MG PO TABS
2.5000 mg | ORAL_TABLET | Freq: Every day | ORAL | Status: DC
  Administered 2016-06-01: 2.5 mg via ORAL
  Filled 2016-06-01: qty 0.5
  Filled 2016-06-01: qty 1
  Filled 2016-06-01: qty 0.5

## 2016-06-01 MED ORDER — ACETAMINOPHEN 650 MG RE SUPP: 650.0000 mg | Freq: Four times a day (QID) | RECTAL | Status: DC | PRN

## 2016-06-01 MED ORDER — AMITRIPTYLINE HCL 25 MG PO TABS
25.0000 mg | ORAL_TABLET | Freq: Every day | ORAL | Status: DC
  Administered 2016-06-01: 25 mg via ORAL
  Filled 2016-06-01: qty 1

## 2016-06-01 MED ORDER — SODIUM CHLORIDE 0.9% FLUSH: 3.0000 mL | Freq: Two times a day (BID) | INTRAVENOUS | Status: DC

## 2016-06-01 MED ORDER — ENOXAPARIN SODIUM 40 MG/0.4ML ~~LOC~~ SOLN: 40.0000 mg | SUBCUTANEOUS | Status: DC

## 2016-06-01 MED ORDER — SPIRONOLACTONE 25 MG PO TABS
25.0000 mg | ORAL_TABLET | Freq: Every morning | ORAL | Status: DC
  Administered 2016-06-01 – 2016-06-02 (×2): 25 mg via ORAL
  Filled 2016-06-01 (×2): qty 1

## 2016-06-01 MED ORDER — SODIUM CHLORIDE 0.9 % WEIGHT BASED INFUSION: 3.0000 mL/kg/h | INTRAVENOUS | Status: DC

## 2016-06-01 MED ORDER — ACETAMINOPHEN 325 MG PO TABS: 650.0000 mg | ORAL_TABLET | Freq: Four times a day (QID) | ORAL | Status: DC | PRN

## 2016-06-01 MED ORDER — SODIUM CHLORIDE 0.9 % WEIGHT BASED INFUSION: 1.0000 mL/kg/h | INTRAVENOUS | Status: DC

## 2016-06-01 MED ORDER — SODIUM CHLORIDE 0.9% FLUSH: 3.0000 mL | INTRAVENOUS | Status: DC | PRN

## 2016-06-01 MED ORDER — PANTOPRAZOLE SODIUM 40 MG PO TBEC
40.0000 mg | DELAYED_RELEASE_TABLET | Freq: Two times a day (BID) | ORAL | Status: DC
  Administered 2016-06-01 – 2016-06-02 (×3): 40 mg via ORAL
  Filled 2016-06-01 (×3): qty 1

## 2016-06-01 MED ORDER — ONDANSETRON HCL 4 MG/2ML IJ SOLN: 4.0000 mg | Freq: Four times a day (QID) | INTRAMUSCULAR | Status: DC | PRN

## 2016-06-01 MED ORDER — TICAGRELOR 90 MG PO TABS
ORAL_TABLET | ORAL | Status: DC | PRN
  Administered 2016-06-01: 180 mg via ORAL

## 2016-06-01 MED ORDER — BIVALIRUDIN 250 MG IV SOLR
INTRAVENOUS | Status: AC
  Filled 2016-06-01: qty 250

## 2016-06-01 MED ORDER — VERAPAMIL HCL 2.5 MG/ML IV SOLN
INTRAVENOUS | Status: AC
  Filled 2016-06-01: qty 2

## 2016-06-01 MED ORDER — OXYCODONE HCL 5 MG PO TABS: 5.0000 mg | ORAL_TABLET | ORAL | Status: DC | PRN

## 2016-06-01 MED ORDER — TRAZODONE HCL 50 MG PO TABS
250.0000 mg | ORAL_TABLET | Freq: Every day | ORAL | Status: DC
  Administered 2016-06-01: 250 mg via ORAL
  Filled 2016-06-01: qty 1

## 2016-06-01 MED ORDER — MIDAZOLAM HCL 2 MG/2ML IJ SOLN
INTRAMUSCULAR | Status: DC | PRN
  Administered 2016-06-01: 1 mg via INTRAVENOUS

## 2016-06-01 MED ORDER — IOPAMIDOL (ISOVUE-300) INJECTION 61%
INTRAVENOUS | Status: DC | PRN
  Administered 2016-06-01: 170 mL via INTRA_ARTERIAL

## 2016-06-01 MED ORDER — ONDANSETRON HCL 4 MG PO TABS: 4.0000 mg | ORAL_TABLET | Freq: Four times a day (QID) | ORAL | Status: DC | PRN

## 2016-06-01 MED ORDER — SPIRONOLACTONE 25 MG PO TABS
25.0000 mg | ORAL_TABLET | Freq: Every morning | ORAL | Status: DC
  Administered 2016-06-01: 25 mg via ORAL
  Filled 2016-06-01: qty 1

## 2016-06-01 MED ORDER — ENOXAPARIN SODIUM 40 MG/0.4ML ~~LOC~~ SOLN
40.0000 mg | SUBCUTANEOUS | Status: DC
  Filled 2016-06-01: qty 0.4

## 2016-06-01 MED ORDER — TICAGRELOR 90 MG PO TABS
ORAL_TABLET | ORAL | Status: AC
  Filled 2016-06-01: qty 2

## 2016-06-01 MED ORDER — TRAMADOL HCL 50 MG PO TABS
50.0000 mg | ORAL_TABLET | Freq: Four times a day (QID) | ORAL | Status: DC | PRN
  Administered 2016-06-01 – 2016-06-02 (×2): 50 mg via ORAL
  Filled 2016-06-01 (×2): qty 1

## 2016-06-01 SURGICAL SUPPLY — 19 items
CATH INFINITI 5 FR IM (CATHETERS) ×3 IMPLANT
CATH INFINITI 5 FR JL3.5 (CATHETERS) ×3 IMPLANT
CATH INFINITI 5FR ANG PIGTAIL (CATHETERS) ×3 IMPLANT
CATH INFINITI JR4 5F (CATHETERS) ×3 IMPLANT
CATH VISTA GUIDE 6FR JR4 (CATHETERS) ×3 IMPLANT
DEVICE CLOSURE MYNXGRIP 6/7F (Vascular Products) ×3 IMPLANT
DEVICE INFLAT 30 PLUS (MISCELLANEOUS) ×3 IMPLANT
DEVICE SAFEGUARD 24CM (GAUZE/BANDAGES/DRESSINGS) ×3 IMPLANT
DEVICE SPIDERFX EMB PROT 4MM (WIRE) ×3 IMPLANT
GAUZE SPONGE 4X4 12PLY STRL (GAUZE/BANDAGES/DRESSINGS) ×3 IMPLANT
KIT MANI 3VAL PERCEP (MISCELLANEOUS) ×3 IMPLANT
NEEDLE PERC 18GX7CM (NEEDLE) ×3 IMPLANT
PACK CARDIAC CATH (CUSTOM PROCEDURE TRAY) ×3 IMPLANT
SHEATH AVANTI 5FR X 11CM (SHEATH) ×3 IMPLANT
SHEATH AVANTI 6FR X 11CM (SHEATH) ×3 IMPLANT
STENT XIENCE ALPINE RX 3.0X15 (Permanent Stent) ×3 IMPLANT
WIRE EMERALD 3MM-J .035X150CM (WIRE) ×3 IMPLANT
WIRE EMERALD 3MM-J .035X260CM (WIRE) ×3 IMPLANT
WIRE RUNTHROUGH .014X300CM (WIRE) ×3 IMPLANT

## 2016-06-01 NOTE — ED Notes (Signed)
Attempted to call report to floor x1, left ascom number for nurse to call back

## 2016-06-01 NOTE — ED Notes (Signed)
Per MD willis, Pt to take her own BP medicine tonight and is ok to be transported to floor

## 2016-06-01 NOTE — Progress Notes (Signed)
Pt clinically stable post heart cath with stent to rca graft, right groin with pad device intact, placed back on with 40 ml air for small hematoma formation that has resolved with pressure, bp stable, denies complaints, also voided large amt urine,report called to [employer] on telemetry. Plan reviewed.

## 2016-06-01 NOTE — Progress Notes (Signed)
Returned from cardiac cath. Site has small amount of blood from hematoma in specials recovery.  No further developments

## 2016-06-01 NOTE — Consult Note (Signed)
Cardiology Consult    Patient ID: Charlotte Wagner MRN: [id number], DOB/AGE: [birth year]   Admit date: 05/31/2016 Date of Consult: 06/01/2016  Primary Physician: [name], MD Primary Cardiologist: new - seen by M. Fletcher Anon, MD  Requesting Provider: Claria Dice  Patient Profile    80 y/o ? with a h/o CAD s/p remote CABG, HTN, HL, and palpitations, who presented to [facility] on 8/24 secondary to exertional angina.  Past Medical History   Past Medical History:  Diagnosis Date  . Allergy   . Arthritis    s/p bilateral knees and left hip replacement  . Asthma   . CAD (coronary artery disease)    a. 12/1996 s/p CABG x4 ([place]);  b. 2005 Pt reports stress test & cath, which revealed patent grafts.  . Cancer ([place])    melanoma right arm  . Carotid arterial disease ([place])    a. 04/2015 Carotid U/S: <50% bilat ICA stenosis.  . Chronic kidney disease   . Colon polyps    H/O  . Depression   . GERD (gastroesophageal reflux disease)    h/o hiatal hernia  . Headache    migraines in past  . Heart murmur    a. 04/2011 Echo: EF 55-60%, bilat atrial enlargement, mild to mod TR.  [name] History of chicken pox   . History of hiatal hernia   . Hx of migraines    rare now  . Hx: UTI (urinary tract infection)   . Hyperlipidemia    a. Statin intolerant -->on zetia.  . Hypertensive heart disease   . Lichen planus   . Melanoma ([place])   . Palpitations    a. rare PVC's and h/o SVT.  [name] PMR (polymyalgia rheumatica) (HCC)    h/o in setting of crestor usage.  [name] PSVT (paroxysmal supraventricular tachycardia) ([place])   . PUD (peptic ulcer disease)    remote history  . Raynaud's phenomenon   . Spinal stenosis   . Urine incontinence    H/O  . Vitamin D deficiency     Past Surgical History:  Procedure Laterality Date  . ADENOIDECTOMY     age 55  . BACK SURGERY     L3-L5  . BILATERAL CARPAL TUNNEL RELEASE    . BREAST BIOPSY Right    bx x 3 neg  . BREAST SURGERY Right    biopsy x 3 (all  benign)  . CATARACT EXTRACTION W/PHACO Right 01/04/2016   Procedure: CATARACT EXTRACTION PHACO AND INTRAOCULAR LENS PLACEMENT (IOC);  Surgeon: Leandrew Koyanagi, MD;  Location: [place];  Service: Ophthalmology;  Laterality: Right;  MALYUGIN  . CATARACT EXTRACTION W/PHACO Left 01/25/2016   Procedure: CATARACT EXTRACTION PHACO AND INTRAOCULAR LENS PLACEMENT ([place]) left eye;  Surgeon: Leandrew Koyanagi, MD;  Location: [place];  Service: Ophthalmology;  Laterality: Left;  MALYUGIN SHUGARCAINE  . CHOLECYSTECTOMY  90's  . COLONOSCOPY WITH PROPOFOL N/A 05/03/2015   Procedure: COLONOSCOPY WITH PROPOFOL;  Surgeon: Lollie Sails, MD;  Location: [facility] ENDOSCOPY;  Service: Endoscopy;  Laterality: N/A;  . CORONARY ARTERY BYPASS GRAFT  98   4 vessel  . ESOPHAGOGASTRODUODENOSCOPY N/A 03/22/2015   Procedure: ESOPHAGOGASTRODUODENOSCOPY (EGD);  Surgeon: Lollie Sails, MD;  Location: [facility] ENDOSCOPY;  Service: Endoscopy;  Laterality: N/A;  . JOINT REPLACEMENT     BILATERAL KNEE REPLACEMENTS  . KNEE ARTHROSCOPY W/ OATS PROCEDURE     Lt knee (9/01), Rt knee (3/11), Lt hip (5/10)  . TOTAL HIP ARTHROPLASTY  Allergies  Allergies  Allergen Reactions  . Beta Adrenergic Blockers Shortness Of Breath  . Albuterol     rigors  . Aspirin Other (See Comments)    Heartburn   . Penicillins Hives and Other (See Comments)    Has patient had a PCN reaction causing immediate rash, facial/tongue/throat swelling, SOB or lightheadedness with hypotension: No Has patient had a PCN reaction causing severe rash involving mucus membranes or skin necrosis: No Has patient had a PCN reaction that required hospitalization No Has patient had a PCN reaction occurring within the last 10 years: No If all of the above answers are "NO", then may proceed with Cephalosporin use.   . Statins Other (See Comments)    Muscle weakness  . Sulfa Antibiotics Rash    At mouth  . Tetracycline Rash  .  Tetracyclines & Related Rash    History of Present Illness    80 y/o ? with the above complex PMH including CAD s/p CABG x 4 in [place], [place] in 1998.  She says that she had repeat cardiac eval in 2005 with stress testing and cath, apparently revealing 4/4 patent grafts.  She also has a h/o HTN, HL, obesity, remote tob abuse, PSVT, and occasional palpitations/pvc's.  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  She lives @ [facility] and is reasonably active, walking often, and generally w/o limitations.  About 1-2 wks ago, she noticed increased frequency of what she thinks were likely PVC's, occurring several times/hr.  She is on chronic verapamil and bisoprolol therapy and increased her bisoprolol to 5 mg daily (from 2.5 mg).  With that, she had reduced burden of PVC's but when she saw her PCP, she apparently had a 1st degree AVB, and so she reduced her bisoprolol back to 2.5 mg daily.  She continued to have intermittent palpitations but was otw doing well until 8/24, she she began to experience mild exertional chest discomfort and pressure with mild palpitations and mild associated dyspnea, lasting about 5-10 minutes, and resolving spontaneously.  After several episodes of recurrent discomfort, which was reminiscent of prior angina, she decided to present to the ED for evaluation.  Here, ECG was non-acute.  Troponins have been nl.  She has not had any further chest pain.  Inpatient Medications    . [MAR Hold] bisoprolol  2.5 mg Oral QHS  . [MAR Hold] enoxaparin (LOVENOX) injection  40 mg Subcutaneous Q24H  . [MAR Hold] pantoprazole  40 mg Oral BID  . [MAR Hold] ramipril  10 mg Oral QHS  . [MAR Hold] sodium chloride flush  3 mL Intravenous Q12H  . [MAR Hold] spironolactone  25 mg Oral q morning - 10a  . [MAR Hold] traZODone  250 mg Oral QHS  . [MAR Hold] verapamil  180 mg Oral BID    Family History    Family History  Problem Relation Age of Onset  . Thyroid  disease Mother     graves disease  . Heart disease Father     rheumatic heart  . Alcohol abuse Father   . Depression Father   . Lung cancer Sister   . Depression Daughter   . Thyroid disease Daughter     hashimoto  . Depression Son   . Stroke Maternal Grandmother   . Depression Maternal Grandmother   . Hypertension Maternal Grandfather   . Hypertension Paternal Grandfather   . Breast cancer Neg Hx     Social History  Social History   Social History  . Marital status: Married    Spouse name: N/A  . Number of children: 3  . Years of education: N/A   Occupational History  . Retired Sport and exercise psychologist    Social History Main Topics  . Smoking status: Former Research scientist (life sciences)  . Smokeless tobacco: Never Used     Comment: quit 44+ yrs ago  . Alcohol use No  . Drug use: No  . Sexual activity: No   Other Topics Concern  . Not on file   Social History Narrative   Lives in [place].  Retired Engineer, drilling.  Relatively active.     Review of Systems    General:  No chills, fever, night sweats or weight changes.  Cardiovascular:  +++ ex chest pain assoc w/ mild dyspnea.  +++ palpitations/pvc's over the past 1-2 wks.  No edema, orthopnea, paroxysmal nocturnal dyspnea. Dermatological: No rash, lesions/masses Respiratory: No cough, dyspnea Urologic: No hematuria, dysuria Abdominal:   No nausea, vomiting, diarrhea, bright red blood per rectum, melena, or hematemesis Neurologic:  No visual changes, wkns, changes in mental status. All other systems reviewed and are otherwise negative except as noted above.  Physical Exam    Blood pressure (!) 156/71, pulse 61, temperature 98.3 F (36.8 C), temperature source Oral, resp. rate 14, height 5\' 3"  (1.6 m), weight 185 lb (83.9 kg), SpO2 100 %.  General: Pleasant, NAD Psych: Normal affect. Neuro: Alert and oriented X 3. Moves all extremities spontaneously. HEENT: Normal  Neck: Supple without bruits or JVD. Lungs:  Resp regular and unlabored,  CTA. Heart: RRR no s3, s4, or murmurs. Abdomen: Soft, non-tender, non-distended, BS + x 4.  Extremities: No clubbing, cyanosis or edema. DP/PT/Radials 2+ and equal bilaterally.  Labs     Recent Labs  05/31/16 1954 06/01/16 0148 06/01/16 0630  TROPONINI <0.03 <0.03 <0.03   Lab Results  Component Value Date   WBC 5.3 06/01/2016   HGB 12.7 06/01/2016   HCT 36.1 06/01/2016   MCV 98.9 06/01/2016   PLT 194 06/01/2016    Recent Labs Lab 06/01/16 0630  NA 138  K 4.1  CL 104  CO2 28  BUN 20  CREATININE 0.83  CALCIUM 9.3  GLUCOSE 98   Lab Results  Component Value Date   CHOL 221 (H) 03/26/2016   HDL 54.80 03/26/2016   LDLCALC 143 (H) 03/26/2016   TRIG 119.0 03/26/2016    Radiology Studies    Dg Chest 2 View  Result Date: 05/31/2016 CLINICAL DATA:  Chest pain EXAM: CHEST  2 VIEW COMPARISON:  None. FINDINGS: The heart size is borderline. The hila and mediastinum are normal. No pulmonary nodules, masses, or focal infiltrates. No acute abnormalities identified. IMPRESSION: No active cardiopulmonary disease. Electronically Signed   By: Dorise Bullion III M.D   On: 05/31/2016 20:07    ECG & Cardiac Imaging    Sinus bradycardia, 56, LAD, delayed R progression, non-specific st/t changes.  Assessment & Plan    1.  Unstable Angina/CAD:  76 y/ ? with a h/o CAD s/p CABG x 4 in 1998 in [place], [place].  Last cardiac eval took place in 2005 with stress testing and cath, apparently revealing 4/4 patent grafts.  On 8/24, she developed exertional chest discomfort associated with dyspnea and palpitations, reminiscent of prior angina, thus prompting her to present to the [facility] ED.  She has r/o for MI.  ECG is non-acute.  Given history and presentation with unstable angina similar to prior  angina, we will plan on diagnostic catheterization today.  The patient understands that risks include but are not limited to stroke (1 in 1000), death (1 in 33), kidney failure [usually temporary] (1 in  500), bleeding (1 in 200), allergic reaction [possibly serious] (1 in 200), and agrees to proceed.  Cont  blocker, acei.  Resume home dose of zeita (statin intolerant).  She is allergic to ASA, and should likely be treated w/ chronic plavix (dependent upon outcome of cath).   2.  Hypertensive Heart Dzs:  BP elevated this AM prior to AM meds.  F/u throughout the day and adjust meds as necessary.  She is on verapamil, bisoprolol, ramipril, and spironolactone @ home.  3.  HL:  Statin intolerant.  Resume zetia.  4.  Palpitations/PSVT/PVC's:  Over the past 1-2 weeks, she has noticed an increase in palpitations, which she believes are PVC's.  Cont  blocker and dilt.  No events on tele overnight. Follow.  Lytes ok.  Signed, Murray Hodgkins, NP 06/01/2016, 10:11 AM

## 2016-06-01 NOTE — Care Management Obs Status (Signed)
[facility] NOTIFICATION   Patient Details  Name: Charlotte Wagner MRN: [id number] Date of Birth: 1936-08-10   Medicare Observation Status Notification Given:  Yes    Jolly Mango, RN 06/01/2016, 9:32 AM

## 2016-06-01 NOTE — Progress Notes (Signed)
[facility] [place] NAME: Charlotte Wagner    MR#:  [id number]  DATE OF BIRTH:  1936-07-22  SUBJECTIVE:  CHIEF COMPLAINT:   Chief Complaint  Patient presents with  . Chest Pain   - chest pain, palpitations- prior h/o CABG in 1998 - for cardiac cath today  REVIEW OF SYSTEMS:  Review of Systems  Constitutional: Negative for chills, fever and malaise/fatigue.  HENT: Negative for ear discharge, ear pain and nosebleeds.   Eyes: Negative for blurred vision.  Respiratory: Negative for cough, shortness of breath and wheezing.   Cardiovascular: Positive for chest pain and palpitations. Negative for leg swelling.  Gastrointestinal: Negative for abdominal pain, constipation, diarrhea, nausea and vomiting.  Genitourinary: Negative for dysuria.  Musculoskeletal: Negative for myalgias.  Neurological: Negative for dizziness, sensory change, speech change, focal weakness, seizures and headaches.    DRUG ALLERGIES:   Allergies  Allergen Reactions  . Beta Adrenergic Blockers Shortness Of Breath  . Albuterol     rigors  . Aspirin Other (See Comments)    Heartburn   . Penicillins Hives and Other (See Comments)    Has patient had a PCN reaction causing immediate rash, facial/tongue/throat swelling, SOB or lightheadedness with hypotension: No Has patient had a PCN reaction causing severe rash involving mucus membranes or skin necrosis: No Has patient had a PCN reaction that required hospitalization No Has patient had a PCN reaction occurring within the last 10 years: No If all of the above answers are "NO", then may proceed with Cephalosporin use.   . Statins Other (See Comments)    Muscle weakness  . Sulfa Antibiotics Rash    At mouth  . Tetracycline Rash  . Tetracyclines & Related Rash    VITALS:  Blood pressure (!) 158/60, pulse (!) 57, temperature 98.2 F (36.8 C), temperature source Oral, resp. rate 18, height 5\' 3"  (1.6 m), weight 83.9  kg (185 lb), SpO2 97 %.  PHYSICAL EXAMINATION:  Physical Exam  GENERAL:  80 y.o.-year-old patient lying in the bed with no acute distress.  EYES: Pupils equal, round, reactive to light and accommodation. No scleral icterus. Extraocular muscles intact.  HEENT: Head atraumatic, normocephalic. Oropharynx and nasopharynx clear.  NECK:  Supple, no jugular venous distention. No thyroid enlargement, no tenderness.  LUNGS: Normal breath sounds bilaterally, no wheezing, rales,rhonchi or crepitation. No use of accessory muscles of respiration.  CARDIOVASCULAR: S1, S2 normal. No rubs, or gallops. 3/6 systolic murmur ABDOMEN: Soft, nontender, nondistended. Bowel sounds present. No organomegaly or mass.  EXTREMITIES: No pedal edema, cyanosis, or clubbing.  NEUROLOGIC: Cranial nerves II through XII are intact. Muscle strength 5/5 in all extremities. Sensation intact. Gait not checked.  PSYCHIATRIC: The patient is alert and oriented x 3.  SKIN: No obvious rash, lesion, or ulcer.    LABORATORY PANEL:   CBC  Recent Labs Lab 06/01/16 1350  WBC 4.2  HGB 13.2  HCT 36.9  PLT 190   ------------------------------------------------------------------------------------------------------------------  Chemistries   Recent Labs Lab 06/01/16 0630 06/01/16 1350  NA 138  --   K 4.1  --   CL 104  --   CO2 28  --   GLUCOSE 98  --   BUN 20  --   CREATININE 0.83 0.77  CALCIUM 9.3  --    ------------------------------------------------------------------------------------------------------------------  Cardiac Enzymes  Recent Labs Lab 06/01/16 1350  TROPONINI <0.03   ------------------------------------------------------------------------------------------------------------------  RADIOLOGY:  Dg Chest 2 View  Result Date: 05/31/2016 CLINICAL  DATA:  Chest pain EXAM: CHEST  2 VIEW COMPARISON:  None. FINDINGS: The heart size is borderline. The hila and mediastinum are normal. No pulmonary  nodules, masses, or focal infiltrates. No acute abnormalities identified. IMPRESSION: No active cardiopulmonary disease. Electronically Signed   By: Dorise Bullion III M.D   On: 05/31/2016 20:07    EKG:   Orders placed or performed during the hospital encounter of 05/31/16  . EKG 12-Lead  . EKG 12-Lead  . ED EKG  . ED EKG  . EKG 12-Lead immediately post procedure  . EKG 12-Lead  . EKG 12-Lead immediately post procedure    ASSESSMENT AND PLAN:   80 year old female with past medical history significant for CAD status post CABG, carotid artery disease, hypertension presents with angina.  #1 angina-unstable angina. -Appreciate cardiac consult. Troponins are negative. -plan to cardiac catheterization today and it showed 90% occlusion to SVG graft to RCA and stent placed -Started on Brilinta,  - on zetia, bisoprolol and ramipril  #2 HTN- bisoprolol, ramipril, aldactone, verapamil  #3 GERD- protonix  #4 DVT prophylaxis- lovenox   All the records are reviewed and case discussed with Care Management/Social Workerr. Management plans discussed with the patient, family and they are in agreement.  CODE STATUS: Full code  TOTAL TIME TAKING CARE OF THIS PATIENT: 33 minutes.   POSSIBLE D/C tomorrow, DEPENDING ON CLINICAL CONDITION.   Heaton Sarin M.D on 06/01/2016 at 2:38 PM  Between 7am to 6pm - Pager - [phone]  After 6pm go to [email or web address] - password [email or web address] [facility]  Office  [phone]  CC: Primary care physician; [name], MD

## 2016-06-01 NOTE — Interval H&P Note (Signed)
Cath Lab Visit (complete for each Cath Lab visit)  Clinical Evaluation Leading to the Procedure:   ACS: Yes.    Non-ACS:    Anginal Classification: CCS IV  Anti-ischemic medical therapy: Maximal Therapy (2 or more classes of medications)  Non-Invasive Test Results: No non-invasive testing performed  Prior CABG: Previous CABG      History and Physical Interval Note:  06/01/2016 9:55 AM  Charlotte Wagner  has presented today for surgery, with the diagnosis of chest pain  The various methods of treatment have been discussed with the patient and family. After consideration of risks, benefits and other options for treatment, the patient has consented to  Procedure(s): LEFT HEART CATH AND CORS/GRAFTS ANGIOGRAPHY (N/A) as a surgical intervention .  The patient's history has been reviewed, patient examined, no change in status, stable for surgery.  I have reviewed the patient's chart and labs.  Questions were answered to the patient's satisfaction.     Kathlyn Sacramento

## 2016-06-01 NOTE — Progress Notes (Signed)
20 ml removed from pressure assistance dressing at 1500.  Helped patient up to bathroom at 1540.  Cathsite immediately began bleeding.  Helped her back to bed.  Continuous pressure to site for 10 minutes.  No additional bleeding.  20 ml replaced in pressure device.

## 2016-06-01 NOTE — CV Procedure (Signed)
Cardiac cath done and showed patent grafts but there was 90% stenosis in SVG to RCA. S/p successful PCI and DES placement.  Will use Brilinta without Aspirin due to severe GI intolerance.  Home tomorrow if no complications.

## 2016-06-02 DIAGNOSIS — E785 Hyperlipidemia, unspecified: Secondary | ICD-10-CM | POA: Diagnosis not present

## 2016-06-02 DIAGNOSIS — K219 Gastro-esophageal reflux disease without esophagitis: Secondary | ICD-10-CM | POA: Diagnosis not present

## 2016-06-02 DIAGNOSIS — I200 Unstable angina: Secondary | ICD-10-CM

## 2016-06-02 DIAGNOSIS — I10 Essential (primary) hypertension: Secondary | ICD-10-CM | POA: Diagnosis not present

## 2016-06-02 DIAGNOSIS — I209 Angina pectoris, unspecified: Secondary | ICD-10-CM | POA: Diagnosis not present

## 2016-06-02 DIAGNOSIS — I2510 Atherosclerotic heart disease of native coronary artery without angina pectoris: Secondary | ICD-10-CM | POA: Diagnosis not present

## 2016-06-02 DIAGNOSIS — I25110 Atherosclerotic heart disease of native coronary artery with unstable angina pectoris: Secondary | ICD-10-CM | POA: Diagnosis not present

## 2016-06-02 LAB — CBC
HCT: 37.7 % (ref 35.0–47.0)
Hemoglobin: 13.4 g/dL (ref 12.0–16.0)
MCH: 35 pg — AB (ref 26.0–34.0)
MCHC: 35.4 g/dL (ref 32.0–36.0)
MCV: 98.7 fL (ref 80.0–100.0)
PLATELETS: 212 10*3/uL (ref 150–440)
RBC: 3.82 MIL/uL (ref 3.80–5.20)
RDW: 12.2 % (ref 11.5–14.5)
WBC: 6.6 10*3/uL (ref 3.6–11.0)

## 2016-06-02 LAB — BASIC METABOLIC PANEL WITH GFR
Anion gap: 10 (ref 5–15)
BUN: 15 mg/dL (ref 6–20)
CO2: 22 mmol/L (ref 22–32)
Calcium: 9.3 mg/dL (ref 8.9–10.3)
Chloride: 102 mmol/L (ref 101–111)
Creatinine, Ser: 0.88 mg/dL (ref 0.44–1.00)
GFR calc Af Amer: >60 mL/min
GFR calc non Af Amer: >60 mL/min
Glucose, Bld: 121 mg/dL — ABNORMAL HIGH (ref 65–99)
Potassium: 4.1 mmol/L (ref 3.5–5.1)
Sodium: 134 mmol/L — ABNORMAL LOW (ref 135–145)

## 2016-06-02 MED ORDER — TICAGRELOR 90 MG PO TABS: 90.0000 mg | ORAL_TABLET | Freq: Two times a day (BID) | ORAL | 2 refills | Status: DC

## 2016-06-02 NOTE — Progress Notes (Signed)
Pressure device applied to right groin removed. Air was removed over night by [employer]. Blood noted under device and dried drainage noted on bandage around device. Once removed no bleeding noted, bruising to area is stable, gauze and transparent dressing applied, patient tolerated procedure. I will continue to assess.

## 2016-06-02 NOTE — Discharge Summary (Signed)
[facility] NAME: Charlotte Wagner    MR#:  [id number]  DATE OF BIRTH:  [birth year]  DATE OF ADMISSION:  05/31/2016   ADMITTING PHYSICIAN: Lance Coon, MD  DATE OF DISCHARGE: 06/02/2016  PRIMARY CARE PHYSICIAN: [name], MD   ADMISSION DIAGNOSIS:   Unstable angina ([place]) [I20.0]  DISCHARGE DIAGNOSIS:   Principal Problem:   Angina pectoris ([place]) Active Problems:   CAD (coronary artery disease)   Essential hypertension, benign   GERD (gastroesophageal reflux disease)   Depression   Hypercholesterolemia   Hypertensive heart disease   Hyperlipidemia   Unstable angina ([place])   SECONDARY DIAGNOSIS:   Past Medical History:  Diagnosis Date  . Allergy   . Arthritis    s/p bilateral knees and left hip replacement  . Asthma   . CAD (coronary artery disease)    a. 12/1996 s/p CABG x4 ([place]);  b. 2005 Pt reports stress test & cath, which revealed patent grafts.  . Cancer ([place])    melanoma right arm  . Carotid arterial disease ([place])    a. 04/2015 Carotid U/S: <50% bilat ICA stenosis.  . Chronic kidney disease   . Colon polyps    H/O  . Depression   . GERD (gastroesophageal reflux disease)    h/o hiatal hernia  . Headache    migraines in past  . Heart murmur    a. 04/2011 Echo: EF 55-60%, bilat atrial enlargement, mild to mod TR.  [name] History of chicken pox   . History of hiatal hernia   . Hx of migraines    rare now  . Hx: UTI (urinary tract infection)   . Hyperlipidemia    a. Statin intolerant -->on zetia.  . Hypertensive heart disease   . Lichen planus   . Melanoma ([place])   . Palpitations    a. rare PVC's and h/o SVT.  [name] PMR (polymyalgia rheumatica) (HCC)    h/o in setting of crestor usage.  [name] PSVT (paroxysmal supraventricular tachycardia) ([place])   . PUD (peptic ulcer disease)    remote history  . Raynaud's phenomenon   . Spinal stenosis   . Urine incontinence    H/O  . Vitamin D deficiency      HOSPITAL COURSE:   80 year old female with past medical history significant for CAD status post CABG, carotid artery disease, spinal stenosis, sciatica, hypertension presents with angina.  #1 angina-unstable angina. -Appreciate cardiac consult. Troponins are negative. -Status post cardiac catheterization and it showed 90% occlusion to SVG graft to RCA and stent placed -Started on Brilinta, patient complaints of dyspnea as a side effect to Brilinta. -Very sensitive to antiplatelet agents-had bleeding from groin at the catheterization site which resolved now. Cannot take aspirin due to severe GI side effects - on zetia, bisoprolol and ramipril  #2 HTN- bisoprolol, ramipril, aldactone, verapamil  #3 GERD- protonix  #4 arthritis/spinal stenosis/sciatica-hold nabumetone due to increased risk of bleeding while Brilinta is added. Continue other pain medications as needed.  Ambulated without any deficits. If femoral bleeding stops, can be discharged home    DISCHARGE CONDITIONS:   Stable CONSULTS OBTAINED:   Treatment Team:  Minna Merritts, MD  DRUG ALLERGIES:   Allergies  Allergen Reactions  . Beta Adrenergic Blockers Shortness Of Breath  . Albuterol     rigors  . Aspirin Other (See Comments)    Heartburn   . Penicillins Hives and Other (See Comments)    Has  patient had a PCN reaction causing immediate rash, facial/tongue/throat swelling, SOB or lightheadedness with hypotension: No Has patient had a PCN reaction causing severe rash involving mucus membranes or skin necrosis: No Has patient had a PCN reaction that required hospitalization No Has patient had a PCN reaction occurring within the last 10 years: No If all of the above answers are "NO", then may proceed with Cephalosporin use.   . Statins Other (See Comments)    Muscle weakness  . Sulfa Antibiotics Rash    At mouth  . Tetracycline Rash  . Tetracyclines & Related Rash   DISCHARGE MEDICATIONS:      Medication List    STOP taking these medications   nabumetone 750 MG tablet Commonly known as:  RELAFEN     TAKE these medications   acetaminophen 500 MG tablet Commonly known as:  TYLENOL Take 1,000 mg by mouth every 6 (six) hours as needed for moderate pain or headache.   amitriptyline 25 MG tablet Commonly known as:  ELAVIL Take 25 mg by mouth at bedtime.   b complex vitamins tablet Take 1 tablet by mouth daily.   bisoprolol 5 MG tablet Commonly known as:  ZEBETA Take 2.5 mg by mouth at bedtime.   CoQ-10 100 MG Caps Take 1 tablet by mouth daily.   ezetimibe 10 MG tablet Commonly known as:  ZETIA Take 10 mg by mouth at bedtime.   FISH OIL PO Take 2,000 mg by mouth daily.   Fluticasone-Salmeterol 250-50 MCG/DOSE Aepb Commonly known as:  ADVAIR DISKUS Inhale 1 puff into the lungs 2 (two) times daily. What changed:  when to take this  reasons to take this   folic acid 1 MG tablet Commonly known as:  FOLVITE take 1 tablet by mouth once daily   HAIR/SKIN/NAILS PO Take 1 tablet by mouth daily.   hydrocortisone 2.5 % cream apply to VULVA twice a day AT 10AM AND 4 PM   ipratropium 17 MCG/ACT inhaler Commonly known as:  ATROVENT HFA Inhale 2 puffs into the lungs every 6 (six) hours as needed for wheezing. What changed:  when to take this   ketoconazole 2 % cream Commonly known as:  NIZORAL Apply 1 application topically daily. What changed:  when to take this  reasons to take this   loratadine 10 MG tablet Commonly known as:  CLARITIN Take 10 mg by mouth daily.   meclizine 25 MG tablet Commonly known as:  ANTIVERT Take 25 mg by mouth 3 (three) times daily as needed for dizziness.   miconazole 2 % cream Commonly known as:  MICOTIN Apply 1 application topically 2 (two) times daily as needed (irritation).   MIRALAX PO Take 8.5 g by mouth at bedtime.   pantoprazole 40 MG tablet Commonly known as:  PROTONIX Take 1 tablet (40 mg total) by mouth 2  (two) times daily.   ramipril 10 MG capsule Commonly known as:  ALTACE Take 10 mg by mouth at bedtime.   spironolactone 25 MG tablet Commonly known as:  ALDACTONE take 1 tablet by mouth every morning   sucralfate 1 g tablet Commonly known as:  CARAFATE Take 1 g by mouth 2 (two) times daily as needed (acid reflux).   ticagrelor 90 MG Tabs tablet Commonly known as:  BRILINTA Take 1 tablet (90 mg total) by mouth 2 (two) times daily.   traMADol 50 MG tablet Commonly known as:  ULTRAM Take 1 tablet (50 mg total) by mouth daily as needed. What changed:  reasons to take this   traZODone 150 MG tablet Commonly known as:  DESYREL Take 250 mg by mouth at bedtime. Patient takes 1 and 2/3 tablets   verapamil 180 MG CR tablet Commonly known as:  CALAN-SR take 1 tablet by mouth twice a day   vitamin B-12 1000 MCG tablet Commonly known as:  CYANOCOBALAMIN Take 1,000 mcg by mouth daily.   vitamin C 500 MG tablet Commonly known as:  ASCORBIC ACID Take 500 mg by mouth at bedtime.   Vitamin D3 2000 units Tabs Take 1 capsule by mouth daily.        DISCHARGE INSTRUCTIONS:   1. PCP follow-up in 1-2 weeks 2. Cardiology follow-up in 2-3 weeks  DIET:   Cardiac diet  ACTIVITY:   Activity as tolerated  OXYGEN:   Home Oxygen: No.  Oxygen Delivery: room air  DISCHARGE LOCATION:   home   If you experience worsening of your admission symptoms, develop shortness of breath, life threatening emergency, suicidal or homicidal thoughts you must seek medical attention immediately by calling 911 or calling your MD immediately  if symptoms less severe.  You Must read complete instructions/literature along with all the possible adverse reactions/side effects for all the Medicines you take and that have been prescribed to you. Take any new Medicines after you have completely understood and accpet all the possible adverse reactions/side effects.   Please note  You were cared for by a  hospitalist during your hospital stay. If you have any questions about your discharge medications or the care you received while you were in the hospital after you are discharged, you can call the unit and asked to speak with the hospitalist on call if the hospitalist that took care of you is not available. Once you are discharged, your primary care physician will handle any further medical issues. Please note that NO REFILLS for any discharge medications will be authorized once you are discharged, as it is imperative that you return to your primary care physician (or establish a relationship with a primary care physician if you do not have one) for your aftercare needs so that they can reassess your need for medications and monitor your lab values.    On the day of Discharge:  VITAL SIGNS:   Blood pressure 138/66, pulse (!) 56, temperature 98.5 F (36.9 C), temperature source Oral, resp. rate 18, height 5\' 3"  (1.6 m), weight 83.9 kg (185 lb), SpO2 97 %.  PHYSICAL EXAMINATION:    GENERAL:  80 y.o.-year-old patient lying in the bed with no acute distress.  EYES: Pupils equal, round, reactive to light and accommodation. No scleral icterus. Extraocular muscles intact.  HEENT: Head atraumatic, normocephalic. Oropharynx and nasopharynx clear.  NECK:  Supple, no jugular venous distention. No thyroid enlargement, no tenderness.  LUNGS: Normal breath sounds bilaterally, no wheezing, rales,rhonchi or crepitation. No use of accessory muscles of respiration.  CARDIOVASCULAR: S1, S2 normal. No rubs, or gallops. 3/6 systolic murmur ABDOMEN: Soft, nontender, nondistended. Bowel sounds present. No organomegaly or mass.  EXTREMITIES: No pedal edema, cyanosis, or clubbing. Right groin catheterization site with no active bleeding at this time. Some bruising from yesterday noted. NEUROLOGIC: Cranial nerves II through XII are intact. Muscle strength 5/5 in all extremities. Sensation intact. Gait not checked.    PSYCHIATRIC: The patient is alert and oriented x 3.  SKIN: No obvious rash, lesion, or ulcer.    DATA REVIEW:   CBC  Recent Labs Lab 06/02/16 0457  WBC 6.6  HGB 13.4  HCT 37.7  PLT 212    Chemistries   Recent Labs Lab 06/02/16 0457  NA 134*  K 4.1  CL 102  CO2 22  GLUCOSE 121*  BUN 15  CREATININE 0.88  CALCIUM 9.3     Microbiology Results  Results for orders placed or performed during the hospital encounter of 05/31/16  MRSA PCR Screening     Status: None   Collection Time: 06/01/16  1:40 AM  Result Value Ref Range Status   MRSA by PCR NEGATIVE NEGATIVE Final    Comment:        The GeneXpert MRSA Assay (FDA approved for NASAL specimens only), is one component of a comprehensive MRSA colonization surveillance program. It is not intended to diagnose MRSA infection nor to guide or monitor treatment for MRSA infections.     RADIOLOGY:  No results found.   Management plans discussed with the patient, family and they are in agreement.  CODE STATUS:     Code Status Orders        Start     Ordered   06/01/16 0020  Full code  Continuous     06/01/16 0019    Code Status History    Date Active Date Inactive Code Status Order ID Comments User Context   06/01/2016 12:19 AM 06/01/2016  1:56 PM Full Code [id number]  Lance Coon, MD Inpatient    Advance Directive Documentation   Flowsheet Row Most Recent Value  Type of Advance Directive  Healthcare Power of Attorney, Living will  Pre-existing out of facility DNR order (yellow form or pink MOST form)  No data  "MOST" Form in Place?  No data      TOTAL TIME TAKING CARE OF THIS PATIENT: 37 minutes.    Gladstone Lighter M.D on 06/02/2016 at 9:23 AM  Between 7am to 6pm - Pager - [phone]  After 6pm go to [email or web address] - Proofreader  [facility]  Office  [phone]  CC: Primary care physician; [name], MD   Note: This dictation was prepared with  Dragon dictation along with smaller phrase technology. Any transcriptional errors that result from this process are unintentional.

## 2016-06-02 NOTE — Progress Notes (Signed)
Patient Name: Charlotte Wagner Date of Encounter: 06/02/2016  Patient Care Team: [name], MD as PCP - General (Internal Medicine)  PROBLEM LIST  Principal Problem:   Angina pectoris [facility]) Active Problems:   CAD (coronary artery disease)   Essential hypertension, benign   GERD (gastroesophageal reflux disease)   Depression   Hypercholesterolemia   Hypertensive heart disease   Hyperlipidemia   Unstable angina ([place])     SUBJECTIVE  Pt had issues with bleeding from RFA access site but this has since resolved. She had been up and walking today with no further bleeding. She feels a little loopy and we both discussed that it might be the Tramadol she took. She has mild SOB that is new. Her O2sats have been wnl. We discussed that it might be side effect of Brillinta.  CURRENT MEDS . amitriptyline  25 mg Oral QHS  . bisoprolol  2.5 mg Oral QHS  . enoxaparin (LOVENOX) injection  40 mg Subcutaneous Q24H  . ezetimibe  10 mg Oral Daily  . pantoprazole  40 mg Oral BID  . polyethylene glycol  17 g Oral Daily  . ramipril  10 mg Oral QHS  . sodium chloride flush  3 mL Intravenous Q12H  . sodium chloride flush  3 mL Intravenous Q12H  . spironolactone  25 mg Oral q morning - 10a  . ticagrelor  90 mg Oral BID  . traZODone  250 mg Oral QHS  . verapamil  180 mg Oral BID    OBJECTIVE  Vitals:   06/01/16 1424 06/01/16 1832 06/01/16 1931 06/02/16 0416  BP: (!) 158/60 (!) 144/59 103/70 138/66  Pulse: (!) 57 64 64 (!) 56  Resp: 18 16 18 18   Temp: 98.2 F (36.8 C) 98.5 F (36.9 C) 98.6 F (37 C) 98.5 F (36.9 C)  TempSrc: Oral Oral Oral Oral  SpO2: 97% 96% 95% 97%  Weight:      Height:        Intake/Output Summary (Last 24 hours) at 06/02/16 0956 Last data filed at 06/02/16 0700  Gross per 24 hour  Intake          1206.06 ml  Output             1600 ml  Net          -393.94 ml   Filed Weights   05/31/16 1951 06/01/16 0045  Weight: 185 lb (83.9 kg) 185 lb (83.9 kg)     PHYSICAL EXAM VS:  BP 138/66 (BP Location: Right Arm)   Pulse (!) 56   Temp 98.5 F (36.9 C) (Oral)   Resp 18   Ht 5\' 3"  (1.6 m)   Wt 185 lb (83.9 kg)   SpO2 97%   BMI 32.77 kg/m  , BMI Body mass index is 32.77 kg/m. GENERAL:  well developed, well nourished, obese, not in acute distress HEENT: normocephalic, pink conjunctivae, anicteric sclerae, no xanthelasma, normal dentition, oropharynx clear NECK:  no neck vein engorgement, JVP normal, no hepatojugular reflux, carotid upstroke brisk and symmetric, soft bruit on L, no thyromegaly, no lymphadenopathy LUNGS:  good respiratory effort, clear to auscultation bilaterally CV:  PMI not displaced, no thrills, no lifts, S1 and S2 within normal limits, no palpable S3 or S4, soft systolic murmur, no rubs, no gallops ABD:  Soft, nontender, nondistended, normoactive bowel sounds, no abdominal aortic bruit, no hepatomegaly, no splenomegaly MS: nontender back, no kyphosis, no scoliosis, no joint deformities EXT:  2+ DP/PT pulses, RFA  pulse ++ and no bruit, no edema, no varicosities, no cyanosis, no clubbing SKIN: warm, nondiaphoretic, normal turgor, no ulcers NEUROPSYCH: alert, oriented to person, place, and time, sensory/motor grossly intact, normal mood, appropriate affect   Accessory Clinical Findings  CBC  Recent Labs  06/01/16 1350 06/02/16 0457  WBC 4.2 6.6  HGB 13.2 13.4  HCT 36.9 37.7  MCV 98.8 98.7  PLT 190 [id number]   Basic Metabolic Panel  Recent Labs  06/01/16 0630 06/01/16 1350 06/02/16 0457  NA 138  --  134*  K 4.1  --  4.1  CL 104  --  102  CO2 28  --  22  GLUCOSE 98  --  121*  BUN 20  --  15  CREATININE 0.83 0.77 0.88  CALCIUM 9.3  --  9.3   Liver Function Tests No results for input(s): AST, ALT, ALKPHOS, BILITOT, PROT, ALBUMIN in the last 72 hours. No results for input(s): LIPASE, AMYLASE in the last 72 hours. Cardiac Enzymes  Recent Labs  06/01/16 0148 06/01/16 0630 06/01/16 1350  TROPONINI <0.03  <0.03 <0.03   BNP (last 3 results) No results for input(s): BNP in the last 8760 hours. D-Dimer No results for input(s): DDIMER in the last 72 hours. Hemoglobin A1C No results for input(s): HGBA1C in the last 72 hours. Fasting Lipid Panel No results for input(s): CHOL, HDL, LDLCALC, TRIG, CHOLHDL, LDLDIRECT in the last 72 hours. Thyroid Function Tests No results for input(s): TSH, T4TOTAL, T3FREE, THYROIDAB in the last 72 hours.  Invalid input(s): FREET3  TELE  SR  RADIOLOGY/STUDIES  Dg Chest 2 View  Result Date: 05/31/2016 CLINICAL DATA:  Chest pain EXAM: CHEST  2 VIEW COMPARISON:  None. FINDINGS: The heart size is borderline. The hila and mediastinum are normal. No pulmonary nodules, masses, or focal infiltrates. No acute abnormalities identified. IMPRESSION: No active cardiopulmonary disease. Electronically Signed   By: Dorise Bullion III M.D   On: 05/31/2016 20:07    ASSESSMENT AND PLAN 1.  Unstable Angina/CAD:  75 y/ ? with a h/o CAD s/p CABG x 4 in 1998 in [place], [place].  Last cardiac eval took place in 2005 with stress testing and cath, apparently revealing 4/4 patent grafts. S/P LHCath and DES to SVG to RCA. Other grafts patent Cont ? blocker - she is very sensitive to this. She had tried bid dosing and developed 1st degree avb. She is tolerating current dose of Zebeta 2.5mg  qd. She did not tolerate Tenormin and metoprolol in the past - lowered blood pressure too much. Cont acei.   Resume home dose of zeita (statin intolerant).   She is allergic to ASA  - significant GI side effects. Developed significant bruising to Plavix.  We discussed importance of staying on [place] for 1 year ideally at least to be transitioned to lifelong antiplatelet (will be rediscussed in future visits). Echo - may be done as outpt if not done in hospital.  2.  Hypertension - BP wnl.  3. Hyperlipidemia:  Statin intolerant.  Resume zetia. Will rediscuss as outpatient.  4.   Palpitations/PSVT/PVC's:  Over the past 1-2 weeks, she has noticed an increase in palpitations, which she believes are PVC's.  Cont ? blocker and dilt.  No events on tele overnight.  5. Likely carotid artery disease - pt mentioned that she has mild disease on L carotid artery. Will consider u/s as outpt.   We will move up her cardiology appt sooner than 06/19/2016 originally scheduled.  I spent at  least 35 minutes with the patient today and more than 50% of the time was spent counseling the patient and coordinating care.   Signed, Wende Bushy, MD  06/02/2016, 9:56 AM  [place]

## 2016-06-04 ENCOUNTER — Telehealth: Payer: Self-pay

## 2016-06-04 ENCOUNTER — Telehealth: Payer: Self-pay | Admitting: Cardiology

## 2016-06-04 NOTE — Telephone Encounter (Signed)
Unable to reach patient.  Left a message to call the office back.  Will continue to follow as appropriate with transitional care management.

## 2016-06-04 NOTE — Telephone Encounter (Signed)
Charlotte Wagner contacted regarding discharge from [facility] on August 26.  Charlotte Wagner understands to follow up with provider Dr. Yvone Neu on 9/5 at [place] at [facility], [place]. Charlotte Wagner understands discharge instructions? yes Charlotte Wagner understands medications and regiment? yes Charlotte Wagner understands to bring all medications to this visit? yes  Pt states she been a bit short of breath since starting Brilinta. She informed Dr. Yvone Neu while in the office and was told this was just a side effect. Advised pt to purchase pulse ox to monitor at home and to call if sx worsen. She is agreeable w/plan

## 2016-06-05 ENCOUNTER — Telehealth: Payer: Self-pay

## 2016-06-05 NOTE — Telephone Encounter (Signed)
Transition Care Management Follow-up Telephone Call   Date discharged? 06/02/16   How have you been since you were released from the hospital? [place].  BASELINE.  SOME BREATHLESSNESS WHICH IS BELIEVED TO BE A SIDE EFFECT OF BRILINTA.  NO CHEST PAIN.  GENERALIZED WEAKNESS. SLEEPING OK.   Do you understand why you were in the hospital? YES, UNSTABLE ANGINA.   Do you understand the discharge instructions? YES, INCREASE ACTIVITY AS TOLERATED.    Where were you discharged to? Home.   Items Reviewed:  Medications reviewed: YES, STOPPED RELAFEN 750MG .  STARTED BRILINTA 90MG .  CONTINUING TO TAKE ALL OTHER SCHEDULED MEDICATION AS DIRECTED.   Allergies reviewed: YES,  BETA ADRENERGIC BLOCKERS, ALBUTEROL, ASPIRIN, PENICILLINS, STATINS, SULFA ANTIBIOTICS, TETRACYCLINE.  Dietary changes reviewed: YES, HEART HEALTHY, CARDIAC DIET.  Referrals reviewed: YES, CARDIOLOGY AND PCP.   Functional Questionnaire:   Activities of Daily Living (ADLs):   She states they are independent in the following: Bathing, dressing, self feeding, grooming, meal prep. States they require assistance with the following: Ambulating (walker in use).   Any transportation issues/concerns?: NO.   Any patient concerns? NONE.   Confirmed importance and date/time of follow-up visits scheduled YES, appointment scheduled 06/14/16 at 10:00.  Provider Appointment booked with Dr. Nicki Reaper (PCP).  Confirmed with patient if condition begins to worsen call PCP or go to the ER.  Patient was given the office number and encouraged to call back with question or concerns.  : YES, PATIENT VERBALIZED UNDERSTANDING.

## 2016-06-05 NOTE — Progress Notes (Signed)
Cardiology Office Note   Date:  06/12/2016   ID:  [name], DOB 1936/06/30, MRN [id number]  Referring Doctor:  [name], MD   Cardiologist:   Wende Bushy, MD   Reason for consultation:  Chief Complaint  Patient presents with  . New Patient (Initial Visit)    NO CP, SOB, A LITTLE SWELLING. PT STATES SHE FEELS WEAK. NO OTHER COMPLAINTS.   hosp ffup after PCI   History of Present Illness: Charlotte Wagner is a 80 y.o. female who presents for Follow-up after PCI to vein graft. Patient is doing fairly okay. No chest pains. She still has the shortness of breath that developed after initiating Brilinta. She is also noted that her blood pressure has been on the lower side  although no symptoms of dizziness or lightheadedness. No palpitations. No chest pain. No syncope.   ROS:  Please see the history of present illness. Aside from mentioned under HPI, all other systems are reviewed and negative.     Past Medical History:  Diagnosis Date  . Allergy   . Arthritis    s/p bilateral knees and left hip replacement  . Asthma   . CAD (coronary artery disease)    a. 12/1996 s/p CABG x4 ([place]);  b. 2005 Pt reports stress test & cath, which revealed patent grafts.  . Cancer ([place])    melanoma right arm  . Carotid arterial disease ([place])    a. 04/2015 Carotid U/S: <50% bilat ICA stenosis.  . Chronic kidney disease   . Colon polyps    H/O  . Depression   . GERD (gastroesophageal reflux disease)    h/o hiatal hernia  . Headache    migraines in past  . Heart murmur    a. 04/2011 Echo: EF 55-60%, bilat atrial enlargement, mild to mod TR.  [name] History of chicken pox   . History of hiatal hernia   . Hx of migraines    rare now  . Hx: UTI (urinary tract infection)   . Hyperlipidemia    a. Statin intolerant -->on zetia.  . Hypertensive heart disease   . Lichen planus   . Melanoma ([place])   . Palpitations    a. rare PVC's and h/o SVT.  [name] PMR (polymyalgia rheumatica) ([facility])     h/o in setting of crestor usage.  [name] PSVT (paroxysmal supraventricular tachycardia) ([place])   . PUD (peptic ulcer disease)    remote history  . Raynaud's phenomenon   . Spinal stenosis   . Urine incontinence    H/O  . Vitamin D deficiency     Past Surgical History:  Procedure Laterality Date  . ADENOIDECTOMY     age 65  . BACK SURGERY     L3-L5  . BILATERAL CARPAL TUNNEL RELEASE    . BREAST BIOPSY Right    bx x 3 neg  . BREAST SURGERY Right    biopsy x 3 (all benign)  . CARDIAC CATHETERIZATION N/A 06/01/2016   Procedure: LEFT HEART CATH AND CORS/GRAFTS ANGIOGRAPHY;  Surgeon: Wellington Hampshire, MD;  Location: [facility];  Service: Cardiovascular;  Laterality: N/A;  . CARDIAC CATHETERIZATION N/A 06/01/2016   Procedure: Coronary Stent Intervention;  Surgeon: Wellington Hampshire, MD;  Location: [facility];  Service: Cardiovascular;  Laterality: N/A;  . CATARACT EXTRACTION W/PHACO Right 01/04/2016   Procedure: CATARACT EXTRACTION PHACO AND INTRAOCULAR LENS PLACEMENT (IOC);  Surgeon: Leandrew Koyanagi, MD;  Location: [facility];  Service: Ophthalmology;  Laterality: Right;  MALYUGIN  . CATARACT EXTRACTION W/PHACO Left 01/25/2016   Procedure: CATARACT EXTRACTION PHACO AND INTRAOCULAR LENS PLACEMENT ([place]) left eye;  Surgeon: Leandrew Koyanagi, MD;  Location: [facility];  Service: Ophthalmology;  Laterality: Left;  MALYUGIN SHUGARCAINE  . CHOLECYSTECTOMY  90's  . COLONOSCOPY WITH PROPOFOL N/A 05/03/2015   Procedure: COLONOSCOPY WITH PROPOFOL;  Surgeon: Lollie Sails, MD;  Location: [facility];  Service: Endoscopy;  Laterality: N/A;  . CORONARY ARTERY BYPASS GRAFT  98   4 vessel  . ESOPHAGOGASTRODUODENOSCOPY N/A 03/22/2015   Procedure: ESOPHAGOGASTRODUODENOSCOPY (EGD);  Surgeon: Lollie Sails, MD;  Location: [facility] ENDOSCOPY;  Service: Endoscopy;  Laterality: N/A;  . JOINT REPLACEMENT     BILATERAL KNEE REPLACEMENTS  . KNEE ARTHROSCOPY W/ OATS  PROCEDURE     Lt knee (9/01), Rt knee (3/11), Lt hip (5/10)  . TOTAL HIP ARTHROPLASTY       reports that she has quit smoking. She has never used smokeless tobacco. She reports that she does not drink alcohol or use drugs.   family history includes Alcohol abuse in her father; Depression in her daughter, father, maternal grandmother, and son; Heart disease in her father; Hypertension in her maternal grandfather and paternal grandfather; Lung cancer in her sister; Stroke in her maternal grandmother; Thyroid disease in her daughter and mother.   Outpatient Medications Prior to Visit  Medication Sig Dispense Refill  . acetaminophen (TYLENOL) 500 MG tablet Take 1,000 mg by mouth 2 (two) times daily.     [name] amitriptyline (ELAVIL) 25 MG tablet Take 25 mg by mouth at bedtime.    [name] b complex vitamins tablet Take 1 tablet by mouth daily.    . bisoprolol (ZEBETA) 5 MG tablet Take 2.5 mg by mouth at bedtime.    . Cholecalciferol (VITAMIN D3) 2000 UNITS TABS Take 1 capsule by mouth daily.     . Coenzyme Q10 (COQ-10) 100 MG CAPS Take 1 tablet by mouth daily.    [name] ezetimibe (ZETIA) 10 MG tablet Take 10 mg by mouth at bedtime.    . folic acid (FOLVITE) 1 MG tablet take 1 tablet by mouth once daily 30 tablet 5  . hydrocortisone 2.5 % cream apply to VULVA twice a day AT 10AM AND 4 PM 28 g 0  . ketoconazole (NIZORAL) 2 % cream Apply 1 application topically daily. (Patient taking differently: Apply 1 application topically daily as needed for irritation. ) 30 g 0  . loratadine (CLARITIN) 10 MG tablet Take 10 mg by mouth daily.    . meclizine (ANTIVERT) 25 MG tablet Take 25 mg by mouth 3 (three) times daily as needed for dizziness.    . miconazole (MICOTIN) 2 % cream Apply 1 application topically 2 (two) times daily as needed (irritation).    . Multiple Vitamins-Minerals (HAIR/SKIN/NAILS PO) Take 1 tablet by mouth daily.    . nitroGLYCERIN (NITROSTAT) 0.4 MG SL tablet Place 1 tablet (0.4 mg total) under the  tongue every 5 (five) minutes as needed for chest pain. Place 1 tablet (0.4 mg total) under the tongue every 5 (five) minutes , 3 times as needed for chest pain. 15 tablet 0  . Omega-3 Fatty Acids (FISH OIL PO) Take 2,000 mg by mouth daily.    . pantoprazole (PROTONIX) 40 MG tablet Take 1 tablet (40 mg total) by mouth 2 (two) times daily. 60 tablet 5  . Polyethylene Glycol 3350 (MIRALAX PO) Take 8.5 g by mouth at bedtime.     [name]  ramipril (ALTACE) 10 MG capsule Take 10 mg by mouth at bedtime.    [name] spironolactone (ALDACTONE) 25 MG tablet take 1 tablet by mouth every morning 30 tablet 5  . sucralfate (CARAFATE) 1 g tablet Take 1 g by mouth 2 (two) times daily as needed (acid reflux).    . ticagrelor (BRILINTA) 90 MG TABS tablet Take 1 tablet (90 mg total) by mouth 2 (two) times daily. 60 tablet 2  . traMADol (ULTRAM) 50 MG tablet Take 1 tablet (50 mg total) by mouth daily as needed. (Patient taking differently: Take 50 mg by mouth daily as needed for moderate pain. ) 20 tablet 0  . traZODone (DESYREL) 150 MG tablet Take 275 mg by mouth at bedtime. Patient takes 1 and 2/3 tablets     . verapamil (CALAN-SR) 180 MG CR tablet take 1 tablet by mouth twice a day 60 tablet 5  . vitamin B-12 (CYANOCOBALAMIN) 1000 MCG tablet Take 1,000 mcg by mouth daily.    . vitamin C (ASCORBIC ACID) 500 MG tablet Take 500 mg by mouth at bedtime.     No facility-administered medications prior to visit.      Allergies: Beta adrenergic blockers; Albuterol; Aspirin; Penicillins; Statins; Sulfa antibiotics; Tetracycline; and Tetracyclines & related    PHYSICAL EXAM: VS:  BP 104/68 (BP Location: Left Arm, Patient Position: Sitting, Cuff Size: Normal)   Pulse 67   Ht 5\' 3"  (1.6 m)   Wt 184 lb (83.5 kg)   BMI 32.59 kg/m  , Body mass index is 32.59 kg/m. Wt Readings from Last 3 Encounters:  06/12/16 184 lb (83.5 kg)  06/06/16 180 lb 8 oz (81.9 kg)  06/01/16 185 lb (83.9 kg)    GENERAL:  well developed, well nourished,  obese, not in acute distress HEENT: normocephalic, pink conjunctivae, anicteric sclerae, no xanthelasma, normal dentition, oropharynx clear NECK:  no neck vein engorgement, JVP normal, no hepatojugular reflux, carotid upstroke brisk and symmetric, no bruit, no thyromegaly, no lymphadenopathy LUNGS:  good respiratory effort, clear to auscultation bilaterally CV:  PMI not displaced, no thrills, no lifts, S1 and S2 within normal limits, no palpable S3 or S4, no murmurs, no rubs, no gallops ABD:  Soft, nontender, nondistended, normoactive bowel sounds, no abdominal aortic bruit, no hepatomegaly, no splenomegaly, bruise noted on the left lower quadrant MS: nontender back, no kyphosis, no scoliosis, no joint deformities EXT:  2+ DP/PT pulses, no edema, no varicosities, no cyanosis, no clubbing. RFA is intact, no bruit. Extensive bruising but no hematoma on the right femoral artery access site SKIN: warm, nondiaphoretic, normal turgor, no ulcers NEUROPSYCH: alert, oriented to person, place, and time, sensory/motor grossly intact, normal mood, appropriate affect  Recent Labs: 09/12/2015: TSH 1.54 03/26/2016: ALT 18 06/12/2016: BUN 24; Creatinine, Ser 0.95; Hemoglobin 12.5; Platelets 240; Potassium 4.3; Sodium 134   Lipid Panel    Component Value Date/Time   CHOL 221 (H) 03/26/2016 0932   TRIG 119.0 03/26/2016 0932   HDL 54.80 03/26/2016 0932   CHOLHDL 4 03/26/2016 0932   VLDL 23.8 03/26/2016 0932   LDLCALC 143 (H) 03/26/2016 0932   LDLDIRECT 126.7 07/03/2013 0937     Other studies Reviewed:  EKG:  The ekg from 06/12/2016 was personally reviewed by me and it revealed sinus rhythm, 67 BPM. Low-voltage QRS.  Additional studies/ records that were reviewed personally reviewed by me today include:  Left heart cath 06/01/2016:  Prox LAD lesion, 100 %stenosed.  Ost Cx to Prox Cx lesion, 50 %stenosed.  Ost LM to LM lesion, 70 %stenosed.  Mid RCA lesion, 100 %stenosed.  Prox RCA lesion, 60  %stenosed.  SVG graft was visualized by angiography and is normal in caliber.  Post Atrio lesion, 60 %stenosed.  SVG graft was visualized by angiography and is normal in caliber.  The graft exhibits mild .  The flow in the graft is reversed.  Origin lesion, 30 %stenosed.  SVG graft was visualized by angiography.  Prox Graft lesion, 30 %stenosed.  LIMA graft was visualized by angiography and is normal in caliber and anatomically normal.  The left ventricular systolic function is normal.  LV end diastolic pressure is mildly elevated.  The left ventricular ejection fraction is 55-65% by visual estimate.  A STENT XIENCE ALPINE RX 3.0X15 drug eluting stent was successfully placed, and does not overlap previously placed stent.  Prox Graft lesion, 90 %stenosed.  Post intervention, there is a 0% residual stenosis.   1. Severe underlying three-vessel coronary artery disease with patent grafts including LIMA to LAD, SVG to diagonal, SVG to left circumflex and SVG to RCA. Severe 90% stenosis and SVG to RCA is likely the culprit for unstable angina.  2. Normal LV systolic function and mildly elevated left ventricular end-diastolic pressure.  3. Successful drug-eluting stent placement to the SVG to RCA using a distal protection device.  Recommendations: The patient is severely intolerant to aspirin and she reports inability to take the medication. Thus, I elected to treat her with Brilinta monotherapy. Continue aggressive treatment for coronary artery disease.  Carotid u/s 04/25/2015, ordered by PCP: Less than 50% stenosis bilateral ICA Severe left ECA disease  ASSESSMENT AND PLAN: CAD CAD s/p CABG x 4 in 1998 in [place], [place].  Last cardiac eval took place in 2005 with stress testing and cath, apparently revealing 4/4 patent grafts. S/P LHCath and DES to SVG to RCA 06/01/2016. Other grafts patent Cont ?blocker - she is very sensitive to this. She had tried bid dosing and  developed 1st degree avb. She is tolerating current dose of Zebeta 2.5mg  qd. She did not tolerate Tenormin and metoprolol in the past - lowered blood pressure too much. Cont acei.  Resume home dose of zetia (statin intolerant).  She is allergic to ASA  - significant GI side effects. Developed significant bruising to Plavix.  We discussed importance of staying on [place] for 1 year ideally at least to be transitioned to lifelong antiplatelet (will be rediscussed in future visits). Echo will be ordered. Check CBC, BMP stat. If significant anemia, will likely need to proceed with CT of the abdomen to rule out retroperitoneal hematoma. Recommend cardiac rehabilitation.  Her symptom of dyspnea may be still related to the [place]. She has a history of significant side effects to different medications.  Hypertension  BP wnl.  Hyperlipidemia Statin intolerant. Resume zetia. Patient developed significant muscle weakness and intolerance to rosuvastatin, atorvastatin, simvastatin, and pravastatin.  Palpitations/PSVT/PVC's Over the past 1-2 weeks, she has noticed an increase in palpitations, which she believes are PVC's. Cont ?blocker and dilt.   carotid artery disease  Pt followed by PCP.  Current medicines are reviewed at length with the patient today.  The patient does not have concerns regarding medicines.  Labs/ tests ordered today include:  Orders Placed This Encounter  Procedures  . Basic Metabolic Panel (BMET)  . CBC with Differential/Platelet  . AMB referral to cardiac rehabilitation  . EKG 12-Lead  . ECHOCARDIOGRAM COMPLETE    I had a lengthy and detailed discussion  with the patient regarding diagnoses, prognosis, diagnostic options, treatment options , and side effects of medications.   I counseled the patient on importance of lifestyle modification including heart healthy diet, regular physical activity .   Disposition:   FU with undersigned In one  month Signed, Wende Bushy, MD  06/12/2016 1:03 PM    [place]  This note was generated in part with voice recognition software and I apologize for any typographical errors that were not detected and corrected.

## 2016-06-06 ENCOUNTER — Emergency Department: Payer: Medicare Other

## 2016-06-06 ENCOUNTER — Observation Stay
Admission: EM | Admit: 2016-06-06 | Discharge: 2016-06-07 | Disposition: A | Discharge status: Home / Self Care | Payer: Medicare Other | Attending: Internal Medicine | Admitting: Internal Medicine

## 2016-06-06 ENCOUNTER — Encounter: Payer: Self-pay | Admitting: Emergency Medicine

## 2016-06-06 DIAGNOSIS — K449 Diaphragmatic hernia without obstruction or gangrene: Secondary | ICD-10-CM | POA: Insufficient documentation

## 2016-06-06 DIAGNOSIS — Z883 Allergy status to other anti-infective agents status: Secondary | ICD-10-CM | POA: Diagnosis not present

## 2016-06-06 DIAGNOSIS — Z9889 Other specified postprocedural states: Secondary | ICD-10-CM | POA: Diagnosis not present

## 2016-06-06 DIAGNOSIS — G43909 Migraine, unspecified, not intractable, without status migrainosus: Secondary | ICD-10-CM | POA: Insufficient documentation

## 2016-06-06 DIAGNOSIS — Z96653 Presence of artificial knee joint, bilateral: Secondary | ICD-10-CM | POA: Insufficient documentation

## 2016-06-06 DIAGNOSIS — I25110 Atherosclerotic heart disease of native coronary artery with unstable angina pectoris: Secondary | ICD-10-CM | POA: Insufficient documentation

## 2016-06-06 DIAGNOSIS — Z886 Allergy status to analgesic agent status: Secondary | ICD-10-CM | POA: Diagnosis not present

## 2016-06-06 DIAGNOSIS — M549 Dorsalgia, unspecified: Secondary | ICD-10-CM | POA: Diagnosis not present

## 2016-06-06 DIAGNOSIS — Z87440 Personal history of urinary (tract) infections: Secondary | ICD-10-CM | POA: Insufficient documentation

## 2016-06-06 DIAGNOSIS — Z8711 Personal history of peptic ulcer disease: Secondary | ICD-10-CM | POA: Insufficient documentation

## 2016-06-06 DIAGNOSIS — I2510 Atherosclerotic heart disease of native coronary artery without angina pectoris: Secondary | ICD-10-CM | POA: Diagnosis not present

## 2016-06-06 DIAGNOSIS — R0602 Shortness of breath: Principal | ICD-10-CM | POA: Insufficient documentation

## 2016-06-06 DIAGNOSIS — N189 Chronic kidney disease, unspecified: Secondary | ICD-10-CM | POA: Insufficient documentation

## 2016-06-06 DIAGNOSIS — Z955 Presence of coronary angioplasty implant and graft: Secondary | ICD-10-CM | POA: Insufficient documentation

## 2016-06-06 DIAGNOSIS — Z9049 Acquired absence of other specified parts of digestive tract: Secondary | ICD-10-CM | POA: Insufficient documentation

## 2016-06-06 DIAGNOSIS — Z888 Allergy status to other drugs, medicaments and biological substances status: Secondary | ICD-10-CM | POA: Insufficient documentation

## 2016-06-06 DIAGNOSIS — Z880 Allergy status to penicillin: Secondary | ICD-10-CM | POA: Diagnosis not present

## 2016-06-06 DIAGNOSIS — Z951 Presence of aortocoronary bypass graft: Secondary | ICD-10-CM | POA: Insufficient documentation

## 2016-06-06 DIAGNOSIS — I6523 Occlusion and stenosis of bilateral carotid arteries: Secondary | ICD-10-CM | POA: Insufficient documentation

## 2016-06-06 DIAGNOSIS — F329 Major depressive disorder, single episode, unspecified: Secondary | ICD-10-CM | POA: Insufficient documentation

## 2016-06-06 DIAGNOSIS — F419 Anxiety disorder, unspecified: Secondary | ICD-10-CM | POA: Diagnosis not present

## 2016-06-06 DIAGNOSIS — I209 Angina pectoris, unspecified: Secondary | ICD-10-CM | POA: Diagnosis not present

## 2016-06-06 DIAGNOSIS — E559 Vitamin D deficiency, unspecified: Secondary | ICD-10-CM | POA: Insufficient documentation

## 2016-06-06 DIAGNOSIS — R079 Chest pain, unspecified: Secondary | ICD-10-CM | POA: Diagnosis not present

## 2016-06-06 DIAGNOSIS — Z86010 Personal history of colonic polyps: Secondary | ICD-10-CM | POA: Insufficient documentation

## 2016-06-06 DIAGNOSIS — K219 Gastro-esophageal reflux disease without esophagitis: Secondary | ICD-10-CM | POA: Insufficient documentation

## 2016-06-06 DIAGNOSIS — Z96642 Presence of left artificial hip joint: Secondary | ICD-10-CM | POA: Insufficient documentation

## 2016-06-06 DIAGNOSIS — Z85820 Personal history of malignant melanoma of skin: Secondary | ICD-10-CM | POA: Diagnosis not present

## 2016-06-06 DIAGNOSIS — E785 Hyperlipidemia, unspecified: Secondary | ICD-10-CM | POA: Diagnosis not present

## 2016-06-06 DIAGNOSIS — Z961 Presence of intraocular lens: Secondary | ICD-10-CM | POA: Insufficient documentation

## 2016-06-06 DIAGNOSIS — R0600 Dyspnea, unspecified: Secondary | ICD-10-CM | POA: Diagnosis present

## 2016-06-06 DIAGNOSIS — J45909 Unspecified asthma, uncomplicated: Secondary | ICD-10-CM | POA: Diagnosis not present

## 2016-06-06 DIAGNOSIS — Z87891 Personal history of nicotine dependence: Secondary | ICD-10-CM | POA: Diagnosis not present

## 2016-06-06 DIAGNOSIS — R0789 Other chest pain: Secondary | ICD-10-CM | POA: Insufficient documentation

## 2016-06-06 DIAGNOSIS — I1310 Hypertensive heart and chronic kidney disease without heart failure, with stage 1 through stage 4 chronic kidney disease, or unspecified chronic kidney disease: Secondary | ICD-10-CM | POA: Insufficient documentation

## 2016-06-06 DIAGNOSIS — I7300 Raynaud's syndrome without gangrene: Secondary | ICD-10-CM | POA: Diagnosis not present

## 2016-06-06 DIAGNOSIS — Z9841 Cataract extraction status, right eye: Secondary | ICD-10-CM | POA: Insufficient documentation

## 2016-06-06 DIAGNOSIS — R002 Palpitations: Secondary | ICD-10-CM | POA: Diagnosis present

## 2016-06-06 DIAGNOSIS — I10 Essential (primary) hypertension: Secondary | ICD-10-CM | POA: Diagnosis present

## 2016-06-06 DIAGNOSIS — E669 Obesity, unspecified: Secondary | ICD-10-CM | POA: Insufficient documentation

## 2016-06-06 DIAGNOSIS — R072 Precordial pain: Secondary | ICD-10-CM

## 2016-06-06 DIAGNOSIS — Z8679 Personal history of other diseases of the circulatory system: Secondary | ICD-10-CM | POA: Insufficient documentation

## 2016-06-06 DIAGNOSIS — L439 Lichen planus, unspecified: Secondary | ICD-10-CM | POA: Insufficient documentation

## 2016-06-06 DIAGNOSIS — I25118 Atherosclerotic heart disease of native coronary artery with other forms of angina pectoris: Secondary | ICD-10-CM | POA: Diagnosis present

## 2016-06-06 DIAGNOSIS — Z9842 Cataract extraction status, left eye: Secondary | ICD-10-CM | POA: Insufficient documentation

## 2016-06-06 DIAGNOSIS — M353 Polymyalgia rheumatica: Secondary | ICD-10-CM | POA: Diagnosis not present

## 2016-06-06 DIAGNOSIS — M1990 Unspecified osteoarthritis, unspecified site: Secondary | ICD-10-CM | POA: Diagnosis not present

## 2016-06-06 DIAGNOSIS — Z6831 Body mass index (BMI) 31.0-31.9, adult: Secondary | ICD-10-CM | POA: Insufficient documentation

## 2016-06-06 LAB — CBC WITH DIFFERENTIAL/PLATELET
BASOS ABS: 0 10*3/uL (ref 0–0.1)
Basophils Relative: 1 %
EOS PCT: 2 %
Eosinophils Absolute: 0.1 10*3/uL (ref 0–0.7)
HEMATOCRIT: 33.9 % — AB (ref 35.0–47.0)
Hemoglobin: 12.2 g/dL (ref 12.0–16.0)
LYMPHS ABS: 0.8 10*3/uL — AB (ref 1.0–3.6)
LYMPHS PCT: 18 %
MCH: 35.5 pg — AB (ref 26.0–34.0)
MCHC: 35.9 g/dL (ref 32.0–36.0)
MCV: 98.8 fL (ref 80.0–100.0)
Monocytes Absolute: 0.5 10*3/uL (ref 0.2–0.9)
Monocytes Relative: 13 %
NEUTROS ABS: 2.8 10*3/uL (ref 1.4–6.5)
Neutrophils Relative %: 66 %
PLATELETS: 206 10*3/uL (ref 150–440)
RBC: 3.43 MIL/uL — AB (ref 3.80–5.20)
RDW: 12.2 % (ref 11.5–14.5)
WBC: 4.2 10*3/uL (ref 3.6–11.0)

## 2016-06-06 LAB — BASIC METABOLIC PANEL
Anion gap: 9 (ref 5–15)
BUN: 17 mg/dL (ref 6–20)
CHLORIDE: 101 mmol/L (ref 101–111)
CO2: 23 mmol/L (ref 22–32)
CREATININE: 0.82 mg/dL (ref 0.44–1.00)
Calcium: 9.4 mg/dL (ref 8.9–10.3)
GFR calc non Af Amer: >60 mL/min (ref >60)
Glucose, Bld: 89 mg/dL (ref 65–99)
POTASSIUM: 3.9 mmol/L (ref 3.5–5.1)
SODIUM: 133 mmol/L — AB (ref 135–145)

## 2016-06-06 LAB — TROPONIN I

## 2016-06-06 MED ORDER — ONDANSETRON HCL 4 MG/2ML IJ SOLN
4.0000 mg | Freq: Four times a day (QID) | INTRAMUSCULAR | Status: DC | PRN
Start: 2016-06-06 — End: 2016-06-07

## 2016-06-06 MED ORDER — BISOPROLOL FUMARATE 5 MG PO TABS
2.5000 mg | ORAL_TABLET | Freq: Every day | ORAL | Status: DC
  Filled 2016-06-06 (×2): qty 0.5

## 2016-06-06 MED ORDER — PANTOPRAZOLE SODIUM 40 MG PO TBEC
40.0000 mg | DELAYED_RELEASE_TABLET | Freq: Once | ORAL | Status: AC
  Administered 2016-06-06: 40 mg via ORAL
  Filled 2016-06-06: qty 1

## 2016-06-06 MED ORDER — ACETAMINOPHEN 500 MG PO TABS
1000.0000 mg | ORAL_TABLET | Freq: Two times a day (BID) | ORAL | Status: DC
  Administered 2016-06-07: 1000 mg via ORAL
  Filled 2016-06-06: qty 2

## 2016-06-06 MED ORDER — VITAMIN B-12 1000 MCG PO TABS
1000.0000 ug | ORAL_TABLET | Freq: Every day | ORAL | Status: DC
  Administered 2016-06-07: 1000 ug via ORAL
  Filled 2016-06-06: qty 1

## 2016-06-06 MED ORDER — ASPIRIN 81 MG PO CHEW
324.0000 mg | CHEWABLE_TABLET | Freq: Once | ORAL | Status: AC
  Administered 2016-06-06: 324 mg via ORAL
  Filled 2016-06-06: qty 4

## 2016-06-06 MED ORDER — COQ-10 100 MG PO CAPS: 1.0000 | ORAL_CAPSULE | Freq: Every day | ORAL | Status: DC

## 2016-06-06 MED ORDER — TICAGRELOR 90 MG PO TABS
90.0000 mg | ORAL_TABLET | Freq: Two times a day (BID) | ORAL | Status: DC
  Administered 2016-06-06 – 2016-06-07 (×2): 90 mg via ORAL
  Filled 2016-06-06 (×2): qty 1

## 2016-06-06 MED ORDER — B COMPLEX-C PO TABS
1.0000 | ORAL_TABLET | Freq: Every day | ORAL | Status: DC
  Administered 2016-06-07: 1 via ORAL
  Filled 2016-06-06: qty 1

## 2016-06-06 MED ORDER — VITAMIN C 500 MG PO TABS: 500.0000 mg | ORAL_TABLET | Freq: Every day | ORAL | Status: DC

## 2016-06-06 MED ORDER — SUCRALFATE 1 G PO TABS: 1.0000 g | ORAL_TABLET | Freq: Two times a day (BID) | ORAL | Status: DC | PRN

## 2016-06-06 MED ORDER — AMITRIPTYLINE HCL 25 MG PO TABS
25.0000 mg | ORAL_TABLET | Freq: Every day | ORAL | Status: DC
  Administered 2016-06-06: 25 mg via ORAL
  Filled 2016-06-06: qty 1

## 2016-06-06 MED ORDER — VERAPAMIL HCL ER 180 MG PO TBCR
180.0000 mg | EXTENDED_RELEASE_TABLET | Freq: Two times a day (BID) | ORAL | Status: DC
  Administered 2016-06-07: 180 mg via ORAL
  Filled 2016-06-06: qty 1

## 2016-06-06 MED ORDER — ENOXAPARIN SODIUM 40 MG/0.4ML ~~LOC~~ SOLN
40.0000 mg | Freq: Every day | SUBCUTANEOUS | Status: DC
  Administered 2016-06-06: 40 mg via SUBCUTANEOUS
  Filled 2016-06-06: qty 0.4

## 2016-06-06 MED ORDER — EZETIMIBE 10 MG PO TABS
10.0000 mg | ORAL_TABLET | Freq: Every day | ORAL | Status: DC
  Administered 2016-06-06: 10 mg via ORAL
  Filled 2016-06-06 (×2): qty 1

## 2016-06-06 MED ORDER — RAMIPRIL 10 MG PO CAPS: 10.0000 mg | ORAL_CAPSULE | Freq: Every day | ORAL | Status: DC

## 2016-06-06 MED ORDER — LORATADINE 10 MG PO TABS
10.0000 mg | ORAL_TABLET | Freq: Every day | ORAL | Status: DC
  Administered 2016-06-07: 10 mg via ORAL
  Filled 2016-06-06: qty 1

## 2016-06-06 MED ORDER — FOLIC ACID 1 MG PO TABS
1.0000 mg | ORAL_TABLET | Freq: Every day | ORAL | Status: DC
  Administered 2016-06-07: 1 mg via ORAL
  Filled 2016-06-06: qty 1

## 2016-06-06 MED ORDER — FAMOTIDINE 20 MG PO TABS
40.0000 mg | ORAL_TABLET | Freq: Once | ORAL | Status: DC
  Filled 2016-06-06: qty 2

## 2016-06-06 MED ORDER — RAMIPRIL 10 MG PO CAPS
10.0000 mg | ORAL_CAPSULE | Freq: Every day | ORAL | Status: DC
  Administered 2016-06-06 – 2016-06-07 (×2): 10 mg via ORAL
  Filled 2016-06-06 (×2): qty 1

## 2016-06-06 MED ORDER — SPIRONOLACTONE 25 MG PO TABS
25.0000 mg | ORAL_TABLET | Freq: Every morning | ORAL | Status: DC
  Administered 2016-06-07: 25 mg via ORAL
  Filled 2016-06-06: qty 1

## 2016-06-06 MED ORDER — VITAMIN D 1000 UNITS PO TABS
2000.0000 [IU] | ORAL_TABLET | Freq: Every day | ORAL | Status: DC
  Administered 2016-06-07: 2000 [IU] via ORAL
  Filled 2016-06-06: qty 2

## 2016-06-06 MED ORDER — TRAZODONE HCL 50 MG PO TABS
275.0000 mg | ORAL_TABLET | Freq: Every day | ORAL | Status: DC
  Administered 2016-06-06: 275 mg via ORAL
  Filled 2016-06-06: qty 2

## 2016-06-06 MED ORDER — ACETAMINOPHEN 325 MG PO TABS: 650.0000 mg | ORAL_TABLET | ORAL | Status: DC | PRN

## 2016-06-06 MED ORDER — ALUM & MAG HYDROXIDE-SIMETH 200-200-20 MG/5ML PO SUSP
15.0000 mL | Freq: Once | ORAL | Status: AC
  Administered 2016-06-06: 15 mL via ORAL
  Filled 2016-06-06: qty 30

## 2016-06-06 MED ORDER — PANTOPRAZOLE SODIUM 40 MG PO TBEC
40.0000 mg | DELAYED_RELEASE_TABLET | Freq: Two times a day (BID) | ORAL | Status: DC
  Administered 2016-06-07: 40 mg via ORAL
  Filled 2016-06-06: qty 1

## 2016-06-06 MED ORDER — VERAPAMIL HCL ER 180 MG PO TBCR
180.0000 mg | EXTENDED_RELEASE_TABLET | Freq: Once | ORAL | Status: AC
  Administered 2016-06-06: 180 mg via ORAL
  Filled 2016-06-06: qty 1

## 2016-06-06 MED ORDER — BISOPROLOL FUMARATE 5 MG PO TABS
2.5000 mg | ORAL_TABLET | ORAL | Status: AC
  Administered 2016-06-06: 2.5 mg via ORAL
  Filled 2016-06-06: qty 0.5

## 2016-06-06 NOTE — ED Provider Notes (Signed)
[facility] Emergency Department Provider Note  ____________________________________________  Time seen: Approximately 6:32 PM  I have reviewed the triage vital signs and the nursing notes.   HISTORY  Chief Complaint Chest Pain    HPI Charlotte Wagner is a 80 y.o. female who complains of chest pain, worse with exertion, center of the chest, radiating to her upper back. Associated with shortness of breath. No diaphoresis or vomiting. No syncope. Pain is not pleuritic. No lower extremity edema or pain.  She has had pain like this before, first time in 1996 which resulted in a three-vessel bypass. The next to time she had pain like this she ended up having significant coronary artery occlusions requiring stenting. Most recent catheter and stent was 06/01/2016. She's been compliant with medical therapy. 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     Past Medical History:  Diagnosis Date  . Allergy   . Arthritis    s/p bilateral knees and left hip replacement  . Asthma   . CAD (coronary artery disease)    a. 12/1996 s/p CABG x4 ([place]);  b. 2005 Pt reports stress test & cath, which revealed patent grafts.  . Cancer ([place])    melanoma right arm  . Carotid arterial disease ([place])    a. 04/2015 Carotid U/S: <50% bilat ICA stenosis.  . Chronic kidney disease   . Colon polyps    H/O  . Depression   . GERD (gastroesophageal reflux disease)    h/o hiatal hernia  . Headache    migraines in past  . Heart murmur    a. 04/2011 Echo: EF 55-60%, bilat atrial enlargement, mild to mod TR.  [name] History of chicken pox   . History of hiatal hernia   . Hx of migraines    rare now  . Hx: UTI (urinary tract infection)   . Hyperlipidemia    a. Statin intolerant -->on zetia.  . Hypertensive heart disease   . Lichen planus   . Melanoma ([place])   . Palpitations    a. rare PVC's and h/o SVT.  [name] PMR (polymyalgia rheumatica) (H[facility])    h/o  in setting of crestor usage.  [name] PSVT (paroxysmal supraventricular tachycardia) ([place])   . PUD (peptic ulcer disease)    remote history  . Raynaud's phenomenon   . Spinal stenosis   . Urine incontinence    H/O  . Vitamin D deficiency      Patient Active Problem List   Diagnosis Date Noted  . Hypertensive heart disease   . Hyperlipidemia   . Unstable angina ([place])   . Angina pectoris ([place]) 05/31/2016  . Palpitations 05/29/2016  . Viral URI with cough 06/16/2015  . Left carotid bruit 04/18/2015  . Health care maintenance 04/18/2015  . UTI (urinary tract infection) 02/07/2015  . Viral syndrome 01/04/2015  . Dysuria 01/04/2015  . Dysphagia 12/21/2014  . Arm skin lesion, right 12/14/2013  . Acute bronchitis 10/18/2013  . CAD (coronary artery disease) 06/21/2013  . History of colonic polyps 06/21/2013  . Vitamin D deficiency 06/21/2013  . Osteoarthritis 06/21/2013  . Essential hypertension, benign 06/21/2013  . GERD (gastroesophageal reflux disease) 06/21/2013  . Hiatal hernia 06/21/2013  . Goiter 06/21/2013  . Lichen planus [id number]  . Depression 06/21/2013  . Hypercholesterolemia 06/21/2013  . Spinal stenosis 06/21/2013  . Asthma 06/21/2013  . Migraine 06/21/2013  . Frequent UTI 06/21/2013  . Hemorrhoids 06/21/2013  Past Surgical History:  Procedure Laterality Date  . ADENOIDECTOMY     age 30  . BACK SURGERY     L3-L5  . BILATERAL CARPAL TUNNEL RELEASE    . BREAST BIOPSY Right    bx x 3 neg  . BREAST SURGERY Right    biopsy x 3 (all benign)  . CARDIAC CATHETERIZATION N/A 06/01/2016   Procedure: LEFT HEART CATH AND CORS/GRAFTS ANGIOGRAPHY;  Surgeon: Wellington Hampshire, MD;  Location: [place] [facility];  Service: Cardiovascular;  Laterality: N/A;  . CARDIAC CATHETERIZATION N/A 06/01/2016   Procedure: Coronary Stent Intervention;  Surgeon: Wellington Hampshire, MD;  Location: [place] [facility];  Service: Cardiovascular;  Laterality: N/A;  . CATARACT EXTRACTION  W/PHACO Right 01/04/2016   Procedure: CATARACT EXTRACTION PHACO AND INTRAOCULAR LENS PLACEMENT (IOC);  Surgeon: Leandrew Koyanagi, MD;  Location: [place];  Service: Ophthalmology;  Laterality: Right;  MALYUGIN  . CATARACT EXTRACTION W/PHACO Left 01/25/2016   Procedure: CATARACT EXTRACTION PHACO AND INTRAOCULAR LENS PLACEMENT ([place]) left eye;  Surgeon: Leandrew Koyanagi, MD;  Location: [place];  Service: Ophthalmology;  Laterality: Left;  MALYUGIN SHUGARCAINE  . CHOLECYSTECTOMY  90's  . COLONOSCOPY WITH PROPOFOL N/A 05/03/2015   Procedure: COLONOSCOPY WITH PROPOFOL;  Surgeon: Lollie Sails, MD;  Location: [facility] ENDOSCOPY;  Service: Endoscopy;  Laterality: N/A;  . CORONARY ARTERY BYPASS GRAFT  98   4 vessel  . ESOPHAGOGASTRODUODENOSCOPY N/A 03/22/2015   Procedure: ESOPHAGOGASTRODUODENOSCOPY (EGD);  Surgeon: Lollie Sails, MD;  Location: [facility] ENDOSCOPY;  Service: Endoscopy;  Laterality: N/A;  . JOINT REPLACEMENT     BILATERAL KNEE REPLACEMENTS  . KNEE ARTHROSCOPY W/ OATS PROCEDURE     Lt knee (9/01), Rt knee (3/11), Lt hip (5/10)  . TOTAL HIP ARTHROPLASTY       Prior to Admission medications   Medication Sig Start Date End Date Taking? Authorizing Provider  acetaminophen (TYLENOL) 500 MG tablet Take 1,000 mg by mouth every 6 (six) hours as needed for moderate pain or headache.    Historical Provider, MD  amitriptyline (ELAVIL) 25 MG tablet Take 25 mg by mouth at bedtime.    Historical Provider, MD  b complex vitamins tablet Take 1 tablet by mouth daily.    Historical Provider, MD  bisoprolol (ZEBETA) 5 MG tablet Take 2.5 mg by mouth at bedtime.    Historical Provider, MD  Cholecalciferol (VITAMIN D3) 2000 UNITS TABS Take 1 capsule by mouth daily.     Historical Provider, MD  Coenzyme Q10 (COQ-10) 100 MG CAPS Take 1 tablet by mouth daily.    Historical Provider, MD  ezetimibe (ZETIA) 10 MG tablet Take 10 mg by mouth at bedtime.    Historical Provider, MD   Fluticasone-Salmeterol (ADVAIR DISKUS) 250-50 MCG/DOSE AEPB Inhale 1 puff into the lungs 2 (two) times daily. Patient taking differently: Inhale 1 puff into the lungs as needed (cough).  11/10/15   Leone Haven, MD  folic acid (FOLVITE) 1 MG tablet take 1 tablet by mouth once daily 12/05/15   [name], MD  hydrocortisone 2.5 % cream apply to VULVA twice a day AT 10AM AND 4 PM 02/06/16   [name], MD  ipratropium (ATROVENT HFA) 17 MCG/ACT inhaler Inhale 2 puffs into the lungs every 6 (six) hours as needed for wheezing. Patient taking differently: Inhale 2 puffs into the lungs as needed for wheezing.  11/10/15   Leone Haven, MD  ketoconazole (NIZORAL) 2 % cream Apply 1 application topically daily.  Patient taking differently: Apply 1 application topically daily as needed for irritation.  06/15/14   [name], MD  loratadine (CLARITIN) 10 MG tablet Take 10 mg by mouth daily.    Historical Provider, MD  meclizine (ANTIVERT) 25 MG tablet Take 25 mg by mouth 3 (three) times daily as needed for dizziness.    Historical Provider, MD  miconazole (MICOTIN) 2 % cream Apply 1 application topically 2 (two) times daily as needed (irritation).    Historical Provider, MD  Multiple Vitamins-Minerals (HAIR/SKIN/NAILS PO) Take 1 tablet by mouth daily.    Historical Provider, MD  Omega-3 Fatty Acids (FISH OIL PO) Take 2,000 mg by mouth daily.    Historical Provider, MD  pantoprazole (PROTONIX) 40 MG tablet Take 1 tablet (40 mg total) by mouth 2 (two) times daily. 02/24/16   [name], MD  Polyethylene Glycol 3350 (MIRALAX PO) Take 8.5 g by mouth at bedtime.     Historical Provider, MD  ramipril (ALTACE) 10 MG capsule Take 10 mg by mouth at bedtime.    Historical Provider, MD  spironolactone (ALDACTONE) 25 MG tablet take 1 tablet by mouth every morning 01/30/16   [name], MD  sucralfate (CARAFATE) 1 g tablet Take 1 g by mouth 2 (two) times daily as needed (acid reflux).    Historical  Provider, MD  ticagrelor (BRILINTA) 90 MG TABS tablet Take 1 tablet (90 mg total) by mouth 2 (two) times daily. 06/02/16   Gladstone Lighter, MD  traMADol (ULTRAM) 50 MG tablet Take 1 tablet (50 mg total) by mouth daily as needed. Patient taking differently: Take 50 mg by mouth daily as needed for moderate pain.  08/16/15   [name], MD  traZODone (DESYREL) 150 MG tablet Take 250 mg by mouth at bedtime. Patient takes 1 and 2/3 tablets    Historical Provider, MD  verapamil (CALAN-SR) 180 MG CR tablet take 1 tablet by mouth twice a day 03/08/16   [name], MD  vitamin B-12 (CYANOCOBALAMIN) 1000 MCG tablet Take 1,000 mcg by mouth daily.    Historical Provider, MD  vitamin C (ASCORBIC ACID) 500 MG tablet Take 500 mg by mouth at bedtime.    Historical Provider, MD     Allergies Beta adrenergic blockers; Albuterol; Aspirin; Penicillins; Statins; Sulfa antibiotics; Tetracycline; and Tetracyclines & related   Family History  Problem Relation Age of Onset  . Thyroid disease Mother     graves disease  . Heart disease Father     rheumatic heart  . Alcohol abuse Father   . Depression Father   . Lung cancer Sister   . Depression Daughter   . Thyroid disease Daughter     hashimoto  . Depression Son   . Stroke Maternal Grandmother   . Depression Maternal Grandmother   . Hypertension Maternal Grandfather   . Hypertension Paternal Grandfather   . Breast cancer Neg Hx     Social History Social History  Substance Use Topics  . Smoking status: Former Research scientist (life sciences)  . Smokeless tobacco: Never Used     Comment: quit 44+ yrs ago  . Alcohol use No    Review of Systems  Constitutional:   No fever or chills.  ENT:   No sore throat. No rhinorrhea. Cardiovascular:   Positive as above chest pain. Respiratory:   Positive shortness of breath without cough. Gastrointestinal:   Negative for abdominal pain, vomiting and diarrhea.  Genitourinary:   Negative for dysuria or difficulty  urinating. Musculoskeletal:   Negative for  focal pain or swelling Neurological:   Negative for headaches 10-point ROS otherwise negative.  ____________________________________________   PHYSICAL EXAM:  VITAL SIGNS: ED Triage Vitals  Enc Vitals Group     BP      Pulse      Resp      Temp      Temp src      SpO2      Weight      Height      Head Circumference      Peak Flow      Pain Score      Pain Loc      Pain Edu?      Excl. in [place]?     Vital signs reviewed, nursing assessments reviewed.   Constitutional:   Alert and oriented. Well appearing and in no distress. Eyes:   No scleral icterus. No conjunctival pallor. PERRL. EOMI.  No nystagmus. ENT   Head:   Normocephalic and atraumatic.   Nose:   No congestion/rhinnorhea. No septal hematoma   Mouth/Throat:   MMM, no pharyngeal erythema. No peritonsillar mass.    Neck:   No stridor. No SubQ emphysema. No meningismus. Hematological/Lymphatic/Immunilogical:   No cervical lymphadenopathy. Cardiovascular:   RRR. Symmetric bilateral radial and DP pulses.  No murmurs.  Respiratory:   Normal respiratory effort without tachypnea nor retractions. Breath sounds are clear and equal bilaterally. No wheezes/rales/rhonchi. Chest wall nontender  Gastrointestinal:   Soft and nontender. Non distended. There is no CVA tenderness.  No rebound, rigidity, or guarding. Genitourinary:   deferred Musculoskeletal:   Nontender with normal range of motion in all extremities. No joint effusions.  No lower extremity tenderness.  No edema. Neurologic:   Normal speech and language.  CN 2-10 normal. Motor grossly intact. No gross focal neurologic deficits are appreciated.  Skin:    Skin is warm, dry and intact. No rash noted.  No petechiae, purpura, or bullae.  ____________________________________________    LABS (pertinent positives/negatives) (all labs ordered are listed, but only abnormal results are displayed) Labs Reviewed   BASIC METABOLIC PANEL - Abnormal; Notable for the following:       Result Value   Sodium 133 (*)    All other components within normal limits  CBC WITH DIFFERENTIAL/PLATELET - Abnormal; Notable for the following:    RBC 3.43 (*)    HCT 33.9 (*)    MCH 35.5 (*)    Lymphs Abs 0.8 (*)    All other components within normal limits  TROPONIN I   ____________________________________________   EKG  Interpreted by me Sinus bradycardia rate of 58, normal axis intervals. Poor R-wave progression in anterior precordial leads. Normal ST segments and T waves. No acute ischemic changes.  ____________________________________________    RADIOLOGY  Chest x-ray unremarkable  ____________________________________________   PROCEDURES Procedures  ____________________________________________   INITIAL IMPRESSION / ASSESSMENT AND PLAN / ED COURSE  Pertinent labs & imaging results that were available during my care of the patient were reviewed by me and considered in my medical decision making (see chart for details).  Patient well appearing no acute distress. Exam benign. Presents with recurrence of previously established chest pain pattern that has been strongly associated with ischemic events. There is an exertional component. Given the extent of her disease may represent angina pectoris, but will plan to hospitalize for further evaluation tonight. EKG chest x-ray labs including troponin unremarkable for now. Given aspirin with Maalox given her history of GI intolerance. One dose of  oral Protonix as well.     Clinical Course    ----------------------------------------- 7:58 PM on 06/06/2016 ----------------------------------------- Hospitalist patient.  ____________________________________________   FINAL CLINICAL IMPRESSION(S) / ED DIAGNOSES  Final diagnoses:  Precordial pain       Portions of this note were generated with dragon dictation software. Dictation errors may  occur despite best attempts at proofreading.    Carrie Mew, MD 06/06/16 [phone]

## 2016-06-06 NOTE — ED Triage Notes (Signed)
Pt arrived by EMS from [facility] with c/o SOB. Pt states she was seen here on Thurs and admitted for cath on Friday. It was determined that she had an occluded stent. Pt states she "just doesn't feel right and her SOB keeps increases.

## 2016-06-06 NOTE — Progress Notes (Signed)
PHARMACIST - PHYSICIAN ORDER COMMUNICATION  CONCERNING: P&T Medication Policy on Herbal Medications  DESCRIPTION:  This patient's order for:  Coenzyme Q-10  has been noted.  This product(s) is classified as an "herbal" or natural product. Due to a lack of definitive safety studies or FDA approval, nonstandard manufacturing practices, plus the potential risk of unknown drug-drug interactions while on inpatient medications, the Pharmacy and Therapeutics Committee does not permit the use of "herbal" or natural products of this type within [facility].   ACTION TAKEN: The pharmacy department is unable to verify this order at this time. Please reevaluate patient's clinical condition at discharge and address if the herbal or natural product(s) should be resumed at that time.   

## 2016-06-06 NOTE — H&P (Signed)
[facility] NAME: Charlotte Wagner    MR#:  [id number]  DATE OF BIRTH:  [birth year]  DATE OF ADMISSION:  06/06/2016  PRIMARY CARE PHYSICIAN: [name], MD   REQUESTING/REFERRING PHYSICIAN:   CHIEF COMPLAINT:   Chief Complaint  Patient presents with  . Chest Pain    HISTORY OF PRESENT ILLNESS:  Charlotte Wagner  is a 80 y.o. female with a known history of Depression, GERD, coronary artery disease is being admitted for unstable angina.  Her chest pain gets worse with exertion, center of the chest, radiating to her upper back. Associated with shortness of breath. No diaphoresis or vomiting. No syncope. Pain is not pleuritic. No lower extremity edema or pain. She has had pain like this before, first time in 1996 which resulted in a three-vessel bypass. The next to time she had pain like this she ended up having significant coronary artery occlusions requiring stenting. Most recent catheter and stent was 06/01/2016. She's been compliant with medical therapy. 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  PAST MEDICAL HISTORY:   Past Medical History:  Diagnosis Date  . Allergy   . Arthritis    s/p bilateral knees and left hip replacement  . Asthma   . CAD (coronary artery disease)    a. 12/1996 s/p CABG x4 ([facility]);  b. 2005 Pt reports stress test & cath, which revealed patent grafts.  . Cancer ([facility])    melanoma right arm  . Carotid arterial disease ([facility])    a. 04/2015 Carotid U/S: <50% bilat ICA stenosis.  . Chronic kidney disease   . Colon polyps    H/O  . Depression   . GERD (gastroesophageal reflux disease)    h/o hiatal hernia  . Headache    migraines in past  . Heart murmur    a. 04/2011 Echo: EF 55-60%, bilat atrial enlargement, mild to mod TR.  [name] History of chicken pox   . History of hiatal hernia   . Hx of migraines    rare now  . Hx: UTI (urinary tract infection)   . Hyperlipidemia      a. Statin intolerant -->on zetia.  . Hypertensive heart disease   . Lichen planus   . Melanoma ([place])   . Palpitations    a. rare PVC's and h/o SVT.  [name] PMR (polymyalgia rheumatica) (HCC)    h/o in setting of crestor usage.  [name] PSVT (paroxysmal supraventricular tachycardia) ([place])   . PUD (peptic ulcer disease)    remote history  . Raynaud's phenomenon   . Spinal stenosis   . Urine incontinence    H/O  . Vitamin D deficiency     PAST SURGICAL HISTORY:   Past Surgical History:  Procedure Laterality Date  . ADENOIDECTOMY     age 16  . BACK SURGERY     L3-L5  . BILATERAL CARPAL TUNNEL RELEASE    . BREAST BIOPSY Right    bx x 3 neg  . BREAST SURGERY Right    biopsy x 3 (all benign)  . CARDIAC CATHETERIZATION N/A 06/01/2016   Procedure: LEFT HEART CATH AND CORS/GRAFTS ANGIOGRAPHY;  Surgeon: Wellington Hampshire, MD;  Location: [facility];  Service: Cardiovascular;  Laterality: N/A;  . CARDIAC CATHETERIZATION N/A 06/01/2016   Procedure: Coronary Stent Intervention;  Surgeon: Wellington Hampshire, MD;  Location: [facility];  Service: Cardiovascular;  Laterality: N/A;  .  CATARACT EXTRACTION W/PHACO Right 01/04/2016   Procedure: CATARACT EXTRACTION PHACO AND INTRAOCULAR LENS PLACEMENT (IOC);  Surgeon: Leandrew Koyanagi, MD;  Location: [facility];  Service: Ophthalmology;  Laterality: Right;  MALYUGIN  . CATARACT EXTRACTION W/PHACO Left 01/25/2016   Procedure: CATARACT EXTRACTION PHACO AND INTRAOCULAR LENS PLACEMENT ([place]) left eye;  Surgeon: Leandrew Koyanagi, MD;  Location: [facility];  Service: Ophthalmology;  Laterality: Left;  MALYUGIN SHUGARCAINE  . CHOLECYSTECTOMY  90's  . COLONOSCOPY WITH PROPOFOL N/A 05/03/2015   Procedure: COLONOSCOPY WITH PROPOFOL;  Surgeon: Lollie Sails, MD;  Location: [facility]COPY;  Service: Endoscopy;  Laterality: N/A;  . CORONARY ARTERY BYPASS GRAFT  98   4 vessel  . ESOPHAGOGASTRODUODENOSCOPY N/A 03/22/2015    Procedure: ESOPHAGOGASTRODUODENOSCOPY (EGD);  Surgeon: Lollie Sails, MD;  Location: [facility] ENDOSCOPY;  Service: Endoscopy;  Laterality: N/A;  . JOINT REPLACEMENT     BILATERAL KNEE REPLACEMENTS  . KNEE ARTHROSCOPY W/ OATS PROCEDURE     Lt knee (9/01), Rt knee (3/11), Lt hip (5/10)  . TOTAL HIP ARTHROPLASTY      SOCIAL HISTORY:   Social History  Substance Use Topics  . Smoking status: Former Research scientist (life sciences)  . Smokeless tobacco: Never Used     Comment: quit 44+ yrs ago  . Alcohol use No    FAMILY HISTORY:   Family History  Problem Relation Age of Onset  . Thyroid disease Mother     graves disease  . Heart disease Father     rheumatic heart  . Alcohol abuse Father   . Depression Father   . Lung cancer Sister   . Depression Daughter   . Thyroid disease Daughter     hashimoto  . Depression Son   . Stroke Maternal Grandmother   . Depression Maternal Grandmother   . Hypertension Maternal Grandfather   . Hypertension Paternal Grandfather   . Breast cancer Neg Hx    DRUG ALLERGIES:   Allergies  Allergen Reactions  . Beta Adrenergic Blockers Shortness Of Breath  . Albuterol     rigors  . Aspirin Other (See Comments)    Heartburn   . Penicillins Hives and Other (See Comments)    Has patient had a PCN reaction causing immediate rash, facial/tongue/throat swelling, SOB or lightheadedness with hypotension: No Has patient had a PCN reaction causing severe rash involving mucus membranes or skin necrosis: No Has patient had a PCN reaction that required hospitalization No Has patient had a PCN reaction occurring within the last 10 years: No If all of the above answers are "NO", then may proceed with Cephalosporin use.   . Statins Other (See Comments)    Muscle weakness  . Sulfa Antibiotics Rash    At mouth  . Tetracycline Rash  . Tetracyclines & Related Rash    REVIEW OF SYSTEMS:   Review of Systems  Constitutional: Negative for chills, fever and weight loss.  HENT:  Negative for nosebleeds and sore throat.   Eyes: Negative for blurred vision.  Respiratory: Positive for shortness of breath. Negative for cough and wheezing.   Cardiovascular: Positive for chest pain. Negative for orthopnea, leg swelling and PND.  Gastrointestinal: Negative for abdominal pain, constipation, diarrhea, heartburn, nausea and vomiting.  Genitourinary: Negative for dysuria and urgency.  Musculoskeletal: Negative for back pain.  Skin: Negative for rash.  Neurological: Negative for dizziness, speech change, focal weakness and headaches.  Endo/Heme/Allergies: Does not bruise/bleed easily.  Psychiatric/Behavioral: Negative for depression.    MEDICATIONS AT HOME:  Prior to Admission medications   Medication Sig Start Date End Date Taking? Authorizing Provider  acetaminophen (TYLENOL) 500 MG tablet Take 1,000 mg by mouth 2 (two) times daily.    Yes Historical Provider, MD  amitriptyline (ELAVIL) 25 MG tablet Take 25 mg by mouth at bedtime.   Yes Historical Provider, MD  b complex vitamins tablet Take 1 tablet by mouth daily.   Yes Historical Provider, MD  bisoprolol (ZEBETA) 5 MG tablet Take 2.5 mg by mouth at bedtime.   Yes Historical Provider, MD  Cholecalciferol (VITAMIN D3) 2000 UNITS TABS Take 1 capsule by mouth daily.    Yes Historical Provider, MD  Coenzyme Q10 (COQ-10) 100 MG CAPS Take 1 tablet by mouth daily.   Yes Historical Provider, MD  ezetimibe (ZETIA) 10 MG tablet Take 10 mg by mouth at bedtime.   Yes Historical Provider, MD  folic acid (FOLVITE) 1 MG tablet take 1 tablet by mouth once daily 12/05/15  Yes [name], MD  hydrocortisone 2.5 % cream apply to VULVA twice a day AT 10AM AND 4 PM 02/06/16  Yes [name], MD  loratadine (CLARITIN) 10 MG tablet Take 10 mg by mouth daily.   Yes Historical Provider, MD  Multiple Vitamins-Minerals (HAIR/SKIN/NAILS PO) Take 1 tablet by mouth daily.   Yes Historical Provider, MD  Omega-3 Fatty Acids (FISH OIL PO) Take  2,000 mg by mouth daily.   Yes Historical Provider, MD  pantoprazole (PROTONIX) 40 MG tablet Take 1 tablet (40 mg total) by mouth 2 (two) times daily. 02/24/16  Yes [name], MD  ramipril (ALTACE) 10 MG capsule Take 10 mg by mouth at bedtime.   Yes Historical Provider, MD  spironolactone (ALDACTONE) 25 MG tablet take 1 tablet by mouth every morning 01/30/16  Yes [name], MD  ticagrelor (BRILINTA) 90 MG TABS tablet Take 1 tablet (90 mg total) by mouth 2 (two) times daily. 06/02/16  Yes Gladstone Lighter, MD  traZODone (DESYREL) 150 MG tablet Take 275 mg by mouth at bedtime. Patient takes 1 and 2/3 tablets    Yes Historical Provider, MD  verapamil (CALAN-SR) 180 MG CR tablet take 1 tablet by mouth twice a day 03/08/16  Yes [name], MD  vitamin B-12 (CYANOCOBALAMIN) 1000 MCG tablet Take 1,000 mcg by mouth daily.   Yes Historical Provider, MD  vitamin C (ASCORBIC ACID) 500 MG tablet Take 500 mg by mouth at bedtime.   Yes Historical Provider, MD  ketoconazole (NIZORAL) 2 % cream Apply 1 application topically daily. Patient taking differently: Apply 1 application topically daily as needed for irritation.  06/15/14   [name], MD  meclizine (ANTIVERT) 25 MG tablet Take 25 mg by mouth 3 (three) times daily as needed for dizziness.    Historical Provider, MD  miconazole (MICOTIN) 2 % cream Apply 1 application topically 2 (two) times daily as needed (irritation).    Historical Provider, MD  Polyethylene Glycol 3350 (MIRALAX PO) Take 8.5 g by mouth at bedtime.     Historical Provider, MD  sucralfate (CARAFATE) 1 g tablet Take 1 g by mouth 2 (two) times daily as needed (acid reflux).    Historical Provider, MD  traMADol (ULTRAM) 50 MG tablet Take 1 tablet (50 mg total) by mouth daily as needed. Patient taking differently: Take 50 mg by mouth daily as needed for moderate pain.  08/16/15   [name], MD      VITAL SIGNS:  Blood pressure (!) 191/72, pulse 62, resp. rate 16, height  5\' 3"   (1.6 m), weight 83.9 kg (185 lb), SpO2 100 %.  PHYSICAL EXAMINATION:  Physical Exam  GENERAL:  80 y.o.-year-old patient lying in the bed with no acute distress.  EYES: Pupils equal, round, reactive to light and accommodation. No scleral icterus. Extraocular muscles intact.  HEENT: Head atraumatic, normocephalic. Oropharynx and nasopharynx clear.  NECK:  Supple, no jugular venous distention. No thyroid enlargement, no tenderness.  LUNGS: Normal breath sounds bilaterally, no wheezing, rales,rhonchi or crepitation. No use of accessory muscles of respiration.  CARDIOVASCULAR: S1, S2 normal. No murmurs, rubs, or gallops.  ABDOMEN: Soft, nontender, nondistended. Bowel sounds present. No organomegaly or mass.  EXTREMITIES: No pedal edema, cyanosis, or clubbing.  NEUROLOGIC: Cranial nerves II through XII are intact. Muscle strength 5/5 in all extremities. Sensation intact. Gait not checked.  PSYCHIATRIC: The patient is alert and oriented x 3.  SKIN: No obvious rash, lesion, or ulcer.   LABORATORY PANEL:   CBC  Recent Labs Lab 06/06/16 1855  WBC 4.2  HGB 12.2  HCT 33.9*  PLT 206   ------------------------------------------------------------------------------------------------------------------  Chemistries   Recent Labs Lab 06/06/16 1855  NA 133*  K 3.9  CL 101  CO2 23  GLUCOSE 89  BUN 17  CREATININE 0.82  CALCIUM 9.4   ------------------------------------------------------------------------------------------------------------------  Cardiac Enzymes  Recent Labs Lab 06/06/16 1855  TROPONINI <0.03   ------------------------------------------------------------------------------------------------------------------  RADIOLOGY:  Dg Chest 2 View  Result Date: 06/06/2016 CLINICAL DATA:  Short of breath EXAM: CHEST  2 VIEW COMPARISON:  05/31/2016 FINDINGS: Normal heart size. Lungs clear. No pneumothorax. No pleural effusion. Postoperative changes from CABG. IMPRESSION: No  active cardiopulmonary disease. Electronically Signed   By: Marybelle Killings M.D.   On: 06/06/2016 19:46      IMPRESSION AND PLAN:  80 yo female with a known history of coronary artery disease with a recent cardiac catheter last Friday with drug-eluting stent placement in RCA is being admitted for exertional chest pain and shortness of breath  * Unstable angina - trend cardiac enzymes tonight, cardiology consult in am.  I have discussed the case with Dr. 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 Management plans discussed with the patient, Dr Rockey Situ, family and they are in agreement.  CODE STATUS: Full code  TOTAL TIME TAKING CARE OF THIS PATIENT: 45 minutes.    [facility], Donel Osowski M.D on 06/06/2016 at 9:04 PM  Between 7am to 6pm - Pager - [phone]  After 6pm go to [email or web address] - Proofreader  [facility]  Office  [phone]  CC: Primary care physician; [name], MD   Note: This dictation was prepared with Dragon dictation along with smaller phrase technology. Any transcriptional errors that result from this process are unintentional.

## 2016-06-07 DIAGNOSIS — F419 Anxiety disorder, unspecified: Secondary | ICD-10-CM | POA: Diagnosis present

## 2016-06-07 DIAGNOSIS — I2510 Atherosclerotic heart disease of native coronary artery without angina pectoris: Secondary | ICD-10-CM | POA: Diagnosis not present

## 2016-06-07 DIAGNOSIS — R0789 Other chest pain: Secondary | ICD-10-CM

## 2016-06-07 DIAGNOSIS — R0602 Shortness of breath: Secondary | ICD-10-CM | POA: Diagnosis not present

## 2016-06-07 DIAGNOSIS — R0600 Dyspnea, unspecified: Secondary | ICD-10-CM | POA: Diagnosis present

## 2016-06-07 DIAGNOSIS — R079 Chest pain, unspecified: Secondary | ICD-10-CM | POA: Diagnosis not present

## 2016-06-07 DIAGNOSIS — I10 Essential (primary) hypertension: Secondary | ICD-10-CM | POA: Diagnosis not present

## 2016-06-07 MED ORDER — NITROGLYCERIN 0.4 MG SL SUBL: 0.4000 mg | SUBLINGUAL_TABLET | SUBLINGUAL | 0 refills | Status: DC | PRN

## 2016-06-07 NOTE — Consult Note (Signed)
Cardiology Consultation Note  Patient ID: Charlotte Wagner, MRN: [id number], DOB/AGE: 11-04-1935 80 y.o. Admit date: 06/06/2016   Date of Consult: 06/07/2016 Primary Physician: [name], MD Primary Cardiologist: Dr. Fletcher Anon, MD Requesting Physician: Dr. Manuella Ghazi, MD  Chief Complaint: Chest pain Reason for Consult: Chest pain  HPI: 80 y.o. female with h/o CAD s/p CABG x 4 in [place], [place] in 1998 who was recently admitted to [facility] less than one week ago for palpitations and chest pain undergoing cardiac cath on 06/01/16 showing severe 3-vessel CAD with patent grafts s/p PCI/DES to SVG-RCA as below who presents with SOB which she feels is related to her Brilinta and not acute cardiac issue. She also has history of HTN, HLD, palpitations, obesity, remote tobacco abuse, PSVT/PVCs, and anxiety.  She says that she had repeat cardiac eval in 2005 with stress testing and cath, apparently revealing 4/4 patent grafts.  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  She lives @ [place] and is reasonably active, walking often, and generally w/o limitations.  About 1-2 wks ago, she noticed increased frequency of what she thinks were likely PVC's, occurring several times/hr.  She is on chronic verapamil and bisoprolol therapy and increased her bisoprolol to 5 mg daily (from 2.5 mg).  With that, she had reduced burden of PVC's but when she saw her PCP, she apparently had a 1st degree AVB, and so she reduced her bisoprolol back to 2.5 mg daily.  She continued to have intermittent palpitations but was otw doing well until 8/24, she began to experience mild exertional chest discomfort and pressure with mild palpitations and mild associated dyspnea, lasting about 5-10 minutes, and resolving spontaneously causing her to present to [facility] where she underwent repeat cardiac cath that showed severe 3-vessel CAD with patent grafts of the LIMA-LAD, SVG-D, SVG-LCx,a nd SVG -RCA with 90% stenosis s/p  PCI/DES. Treated with Brilinta alone given aspirin allergy.   On 8/30 she began to develop SOB followed by palpitations. This led her to get worked up and anxious. Never with chest pain. She has not missed any doses of her Brilinta. She did not have any SL NTG to use at home. After calming down she has not ahd any further symptoms.   Upon the patient's arrival to [facility] they were found to have negative troponin x 1 to date, WBC 4.2, hgb 12.2, SCr 0.82, K+ 3.9. ECG showed sinus bradycardia, 58 bpm, nonspecific st/t changes, CXR showed no active cardiopulmonary disease. Currently, symptom free.   Past Medical History:  Diagnosis Date  . Allergy   . Arthritis    s/p bilateral knees and left hip replacement  . Asthma   . CAD (coronary artery disease)    a. 12/1996 s/p CABG x4 ([place]);  b. 2005 Pt reports stress test & cath, which revealed patent grafts.  . Cancer ([place])    melanoma right arm  . Carotid arterial disease ([place])    a. 04/2015 Carotid U/S: <50% bilat ICA stenosis.  . Chronic kidney disease   . Colon polyps    H/O  . Depression   . GERD (gastroesophageal reflux disease)    h/o hiatal hernia  . Headache    migraines in past  . Heart murmur    a. 04/2011 Echo: EF 55-60%, bilat atrial enlargement, mild to mod TR.  [name] History of chicken pox   . History of hiatal hernia   . Hx of migraines  rare now  . Hx: UTI (urinary tract infection)   . Hyperlipidemia    a. Statin intolerant -->on zetia.  . Hypertensive heart disease   . Lichen planus   . Melanoma ([place])   . Palpitations    a. rare PVC's and h/o SVT.  [name] PMR (polymyalgia rheumatica) (HCC)    h/o in setting of crestor usage.  [name] PSVT (paroxysmal supraventricular tachycardia) ([place])   . PUD (peptic ulcer disease)    remote history  . Raynaud's phenomenon   . Spinal stenosis   . Urine incontinence    H/O  . Vitamin D deficiency       Most Recent Cardiac Studies: Cardiac cath 06/01/2016: Conclusion     Prox  LAD lesion, 100 %stenosed.  Ost Cx to Prox Cx lesion, 50 %stenosed.  Ost LM to LM lesion, 70 %stenosed.  Mid RCA lesion, 100 %stenosed.  Prox RCA lesion, 60 %stenosed.  SVG graft was visualized by angiography and is normal in caliber.  Post Atrio lesion, 60 %stenosed.  SVG graft was visualized by angiography and is normal in caliber.  The graft exhibits mild .  The flow in the graft is reversed.  Origin lesion, 30 %stenosed.  SVG graft was visualized by angiography.  Prox Graft lesion, 30 %stenosed.  LIMA graft was visualized by angiography and is normal in caliber and anatomically normal.  The left ventricular systolic function is normal.  LV end diastolic pressure is mildly elevated.  The left ventricular ejection fraction is 55-65% by visual estimate.  A STENT XIENCE ALPINE RX 3.0X15 drug eluting stent was successfully placed, and does not overlap previously placed stent.  Prox Graft lesion, 90 %stenosed.  Post intervention, there is a 0% residual stenosis.   1. Severe underlying three-vessel coronary artery disease with patent grafts including LIMA to LAD, SVG to diagonal, SVG to left circumflex and SVG to RCA. Severe 90% stenosis and SVG to RCA is likely the culprit for unstable angina.  2. Normal LV systolic function and mildly elevated left ventricular end-diastolic pressure.  3. Successful drug-eluting stent placement to the SVG to RCA using a distal protection device.  Recommendations: The patient is severely intolerant to aspirin and she reports inability to take the medication. Thus, I elected to treat her with Brilinta monotherapy. Continue aggressive treatment for coronary artery disease.     Surgical History:  Past Surgical History:  Procedure Laterality Date  . ADENOIDECTOMY     age 55  . BACK SURGERY     L3-L5  . BILATERAL CARPAL TUNNEL RELEASE    . BREAST BIOPSY Right    bx x 3 neg  . BREAST SURGERY Right    biopsy x 3 (all benign)  .  CARDIAC CATHETERIZATION N/A 06/01/2016   Procedure: LEFT HEART CATH AND CORS/GRAFTS ANGIOGRAPHY;  Surgeon: Wellington Hampshire, MD;  Location: [place] [facility];  Service: Cardiovascular;  Laterality: N/A;  . CARDIAC CATHETERIZATION N/A 06/01/2016   Procedure: Coronary Stent Intervention;  Surgeon: Wellington Hampshire, MD;  Location: [place] [facility];  Service: Cardiovascular;  Laterality: N/A;  . CATARACT EXTRACTION W/PHACO Right 01/04/2016   Procedure: CATARACT EXTRACTION PHACO AND INTRAOCULAR LENS PLACEMENT (IOC);  Surgeon: Leandrew Koyanagi, MD;  Location: [place];  Service: Ophthalmology;  Laterality: Right;  MALYUGIN  . CATARACT EXTRACTION W/PHACO Left 01/25/2016   Procedure: CATARACT EXTRACTION PHACO AND INTRAOCULAR LENS PLACEMENT ([place]) left eye;  Surgeon: Leandrew Koyanagi, MD;  Location: [place];  Service: Ophthalmology;  Laterality: Left;  MALYUGIN SHUGARCAINE  . CHOLECYSTECTOMY  90's  . COLONOSCOPY WITH PROPOFOL N/A 05/03/2015   Procedure: COLONOSCOPY WITH PROPOFOL;  Surgeon: Lollie Sails, MD;  Location[facility] ENDOSCOPY;  Service: Endoscopy;  Laterality: N/A;  . CORONARY ARTERY BYPASS GRAFT  98   4 vessel  . ESOPHAGOGASTRODUODENOSCOPY N/A 03/22/2015   Procedure: ESOPHAGOGASTRODUODENOSCOPY (EGD);  Surgeon: Lollie Sails, MD;  Location: [facility] ENDOSCOPY;  Service: Endoscopy;  Laterality: N/A;  . JOINT REPLACEMENT     BILATERAL KNEE REPLACEMENTS  . KNEE ARTHROSCOPY W/ OATS PROCEDURE     Lt knee (9/01), Rt knee (3/11), Lt hip (5/10)  . TOTAL HIP ARTHROPLASTY       Home Meds: Prior to Admission medications   Medication Sig Start Date End Date Taking? Authorizing Provider  acetaminophen (TYLENOL) 500 MG tablet Take 1,000 mg by mouth 2 (two) times daily.    Yes Historical Provider, MD  amitriptyline (ELAVIL) 25 MG tablet Take 25 mg by mouth at bedtime.   Yes Historical Provider, MD  b complex vitamins tablet Take 1 tablet by mouth daily.   Yes Historical  Provider, MD  bisoprolol (ZEBETA) 5 MG tablet Take 2.5 mg by mouth at bedtime.   Yes Historical Provider, MD  Cholecalciferol (VITAMIN D3) 2000 UNITS TABS Take 1 capsule by mouth daily.    Yes Historical Provider, MD  Coenzyme Q10 (COQ-10) 100 MG CAPS Take 1 tablet by mouth daily.   Yes Historical Provider, MD  ezetimibe (ZETIA) 10 MG tablet Take 10 mg by mouth at bedtime.   Yes Historical Provider, MD  folic acid (FOLVITE) 1 MG tablet take 1 tablet by mouth once daily 12/05/15  Yes [name], MD  hydrocortisone 2.5 % cream apply to VULVA twice a day AT 10AM AND 4 PM 02/06/16  Yes [name], MD  loratadine (CLARITIN) 10 MG tablet Take 10 mg by mouth daily.   Yes Historical Provider, MD  Multiple Vitamins-Minerals (HAIR/SKIN/NAILS PO) Take 1 tablet by mouth daily.   Yes Historical Provider, MD  Omega-3 Fatty Acids (FISH OIL PO) Take 2,000 mg by mouth daily.   Yes Historical Provider, MD  pantoprazole (PROTONIX) 40 MG tablet Take 1 tablet (40 mg total) by mouth 2 (two) times daily. 02/24/16  Yes [name], MD  ramipril (ALTACE) 10 MG capsule Take 10 mg by mouth at bedtime.   Yes Historical Provider, MD  spironolactone (ALDACTONE) 25 MG tablet take 1 tablet by mouth every morning 01/30/16  Yes [name], MD  ticagrelor (BRILINTA) 90 MG TABS tablet Take 1 tablet (90 mg total) by mouth 2 (two) times daily. 06/02/16  Yes Gladstone Lighter, MD  traZODone (DESYREL) 150 MG tablet Take 275 mg by mouth at bedtime. Patient takes 1 and 2/3 tablets    Yes Historical Provider, MD  verapamil (CALAN-SR) 180 MG CR tablet take 1 tablet by mouth twice a day 03/08/16  Yes [name], MD  vitamin B-12 (CYANOCOBALAMIN) 1000 MCG tablet Take 1,000 mcg by mouth daily.   Yes Historical Provider, MD  vitamin C (ASCORBIC ACID) 500 MG tablet Take 500 mg by mouth at bedtime.   Yes Historical Provider, MD  ketoconazole (NIZORAL) 2 % cream Apply 1 application topically daily. Patient taking differently: Apply 1  application topically daily as needed for irritation.  06/15/14   [name], MD  meclizine (ANTIVERT) 25 MG tablet Take 25 mg by mouth 3 (three) times daily as needed for dizziness.    Historical Provider, MD  miconazole (  MICOTIN) 2 % cream Apply 1 application topically 2 (two) times daily as needed (irritation).    Historical Provider, MD  Polyethylene Glycol 3350 (MIRALAX PO) Take 8.5 g by mouth at bedtime.     Historical Provider, MD  sucralfate (CARAFATE) 1 g tablet Take 1 g by mouth 2 (two) times daily as needed (acid reflux).    Historical Provider, MD  traMADol (ULTRAM) 50 MG tablet Take 1 tablet (50 mg total) by mouth daily as needed. Patient taking differently: Take 50 mg by mouth daily as needed for moderate pain.  08/16/15   [name], MD    Inpatient Medications:  . acetaminophen  1,000 mg Oral BID  . amitriptyline  25 mg Oral QHS  . B-complex with vitamin C  1 tablet Oral Daily  . bisoprolol  2.5 mg Oral QHS  . cholecalciferol  2,000 Units Oral Daily  . enoxaparin (LOVENOX) injection  40 mg Subcutaneous QHS  . ezetimibe  10 mg Oral QHS  . folic acid  1 mg Oral Daily  . loratadine  10 mg Oral Daily  . pantoprazole  40 mg Oral BID AC  . ramipril  10 mg Oral Daily  . ramipril  10 mg Oral QHS  . spironolactone  25 mg Oral q morning - 10a  . ticagrelor  90 mg Oral BID  . traZODone  275 mg Oral QHS  . verapamil  180 mg Oral BID  . vitamin B-12  1,000 mcg Oral Daily  . vitamin C  500 mg Oral QHS      Allergies:  Allergies  Allergen Reactions  . Beta Adrenergic Blockers Shortness Of Breath  . Albuterol     rigors  . Aspirin Other (See Comments)    Heartburn   . Penicillins Hives and Other (See Comments)    Has patient had a PCN reaction causing immediate rash, facial/tongue/throat swelling, SOB or lightheadedness with hypotension: No Has patient had a PCN reaction causing severe rash involving mucus membranes or skin necrosis: No Has patient had a PCN reaction  that required hospitalization No Has patient had a PCN reaction occurring within the last 10 years: No If all of the above answers are "NO", then may proceed with Cephalosporin use.   . Statins Other (See Comments)    Muscle weakness  . Sulfa Antibiotics Rash    At mouth  . Tetracycline Rash  . Tetracyclines & Related Rash    Social History   Social History  . Marital status: Married    Spouse name: N/A  . Number of children: 3  . Years of education: N/A   Occupational History  . Retired Sport and exercise psychologist    Social History Main Topics  . Smoking status: Former Research scientist (life sciences)  . Smokeless tobacco: Never Used     Comment: quit 44+ yrs ago  . Alcohol use No  . Drug use: No  . Sexual activity: No   Other Topics Concern  . Not on file   Social History Narrative   Lives in [place].  Retired Engineer, drilling.  Relatively active.     Family History  Problem Relation Age of Onset  . Thyroid disease Mother     graves disease  . Heart disease Father     rheumatic heart  . Alcohol abuse Father   . Depression Father   . Lung cancer Sister   . Depression Daughter   . Thyroid disease Daughter     hashimoto  . Depression Son   .  Stroke Maternal Grandmother   . Depression Maternal Grandmother   . Hypertension Maternal Grandfather   . Hypertension Paternal Grandfather   . Breast cancer Neg Hx      Review of Systems: Review of Systems  Constitutional: Negative for chills, diaphoresis, fever, malaise/fatigue and weight loss.  HENT: Negative for congestion.   Eyes: Negative for discharge and redness.  Respiratory: Positive for shortness of breath. Negative for cough, hemoptysis, sputum production and wheezing.   Cardiovascular: Positive for palpitations. Negative for chest pain, orthopnea, claudication, leg swelling and PND.  Gastrointestinal: Negative for abdominal pain, blood in stool, heartburn, melena, nausea and vomiting.  Genitourinary: Negative for hematuria.  Musculoskeletal:  Negative for falls and myalgias.  Skin: Negative for rash.  Neurological: Negative for dizziness, tingling, tremors, sensory change, speech change, focal weakness, loss of consciousness and weakness.  Endo/Heme/Allergies: Does not bruise/bleed easily.  Psychiatric/Behavioral: Negative for substance abuse. The patient is nervous/anxious.   All other systems reviewed and are negative.   Labs:  Recent Labs  06/06/16 1855  TROPONINI <0.03   Lab Results  Component Value Date   WBC 4.2 06/06/2016   HGB 12.2 06/06/2016   HCT 33.9 (L) 06/06/2016   MCV 98.8 06/06/2016   PLT 206 06/06/2016     Recent Labs Lab 06/06/16 1855  NA 133*  K 3.9  CL 101  CO2 23  BUN 17  CREATININE 0.82  CALCIUM 9.4  GLUCOSE 89   Lab Results  Component Value Date   CHOL 221 (H) 03/26/2016   HDL 54.80 03/26/2016   LDLCALC 143 (H) 03/26/2016   TRIG 119.0 03/26/2016   No results found for: DDIMER  Radiology/Studies:  Dg Chest 2 View  Result Date: 06/06/2016 CLINICAL DATA:  Short of breath EXAM: CHEST  2 VIEW COMPARISON:  05/31/2016 FINDINGS: Normal heart size. Lungs clear. No pneumothorax. No pleural effusion. Postoperative changes from CABG. IMPRESSION: No active cardiopulmonary disease. Electronically Signed   By: Marybelle Killings M.D.   On: 06/06/2016 19:46   Dg Chest 2 View  Result Date: 05/31/2016 CLINICAL DATA:  Chest pain EXAM: CHEST  2 VIEW COMPARISON:  None. FINDINGS: The heart size is borderline. The hila and mediastinum are normal. No pulmonary nodules, masses, or focal infiltrates. No acute abnormalities identified. IMPRESSION: No active cardiopulmonary disease. Electronically Signed   By: Dorise Bullion III M.D   On: 05/31/2016 20:07    EKG: Interpreted by me showed: sinus bradycardia, 58 bpm, nonspecific st/t changes Telemetry: Interpreted by me showed: sinus bradycardia, 50's bpm  Weights: Filed Weights   06/06/16 1859 06/06/16 2231  Weight: 185 lb (83.9 kg) 180 lb 8 oz (81.9 kg)       Physical Exam: Blood pressure (!) 160/72, pulse (!) 55, temperature 98.2 F (36.8 C), temperature source Oral, resp. rate 17, height 5\' 3"  (1.6 m), weight 180 lb 8 oz (81.9 kg), SpO2 97 %. Body mass index is 31.97 kg/m. General: Well developed, well nourished, in no acute distress. Head: Normocephalic, atraumatic, sclera non-icteric, no xanthomas, nares are without discharge.  Neck: Negative for carotid bruits. JVD not elevated. Lungs: Clear bilaterally to auscultation without wheezes, rales, or rhonchi. Breathing is unlabored. Heart: Bradycardic with S1 S2. No murmurs, rubs, or gallops appreciated. Abdomen: Obese, soft, non-tender, non-distended with normoactive bowel sounds. No hepatomegaly. No rebound/guarding. No obvious abdominal masses. Msk:  Strength and tone appear normal for age. Extremities: No clubbing or cyanosis. No edema. Distal pedal pulses are 2+ and equal bilaterally. Neuro: Alert and  oriented X 3. No facial asymmetry. No focal deficit. Moves all extremities spontaneously. Psych:  Responds to questions appropriately with a normal affect.    Assessment and Plan:  Principal Problem:   SOB (shortness of breath) Active Problems:   CAD (coronary artery disease)   Anxiety   Essential hypertension, benign   Palpitations    1. SOB/palpitations/anxiety: -Felt to be 2/2 Brilinta. Symptoms should improve as she continues the medication -No evidence of ISR on labs or EKG -Continue Briltina without aspirin given allergy -Continue current medical therapy -Add SL NTG for prn usage  -Continue medical therapy  2. CAD as above: -Continue medical therapy as above -She does not want a stress test at this time -No plans for inpatient ischemic evaluation  3. HTN: -Monitor -If remains elevated may need to titrate antihypertensives -Per IM  4. Anxiety: -Per IM   Signed, Christell Faith, PA-C [facility] Pager: [phone] 06/07/2016, 8:48 AM

## 2016-06-07 NOTE — Care Management (Signed)
Patient presents from [facility] - independent living.  Placed in observation for chest pain.  Cardiology has consulted and recommends outpatient cardiac rehab

## 2016-06-07 NOTE — Discharge Instructions (Signed)
Activity as tolerated Outpatient follow-up with primary care physician in a week Outpatient follow-up with the primary cardiology Dr. Solmon Ice on September 5 as scheduled Outpatient cardiology rehabilitation follow-up is needed in a week

## 2016-06-07 NOTE — Progress Notes (Signed)
Pt discharged to home via wc.  Instructions and rx(Nitro) given to pt.  Questions answered.  No distress.

## 2016-06-07 NOTE — Discharge Summary (Signed)
[facility] [place] NAME: Charlotte Wagner    MR#:  [id number]  DATE OF BIRTH:  01-18-1936  DATE OF ADMISSION:  06/06/2016 ADMITTING PHYSICIAN: Max Sane, MD  DATE OF DISCHARGE:  06/07/16 PRIMARY CARE PHYSICIAN: [name], MD    ADMISSION DIAGNOSIS:  Precordial pain [R07.2]  DISCHARGE DIAGNOSIS:  SOB Secondary to Brilinta Chest pain noncardiac  SECONDARY DIAGNOSIS:   Past Medical History:  Diagnosis Date  . Allergy   . Arthritis    s/p bilateral knees and left hip replacement  . Asthma   . CAD (coronary artery disease)    a. 12/1996 s/p CABG x4 ([facility]);  b. 2005 Pt reports stress test & cath, which revealed patent grafts.  . Cancer ([facility])    melanoma right arm  . Carotid arterial disease ([facility])    a. 04/2015 Carotid U/S: <50% bilat ICA stenosis.  . Chronic kidney disease   . Colon polyps    H/O  . Depression   . GERD (gastroesophageal reflux disease)    h/o hiatal hernia  . Headache    migraines in past  . Heart murmur    a. 04/2011 Echo: EF 55-60%, bilat atrial enlargement, mild to mod TR.  [name] History of chicken pox   . History of hiatal hernia   . Hx of migraines    rare now  . Hx: UTI (urinary tract infection)   . Hyperlipidemia    a. Statin intolerant -->on zetia.  . Hypertensive heart disease   . Lichen planus   . Melanoma ([facility])   . Palpitations    a. rare PVC's and h/o SVT.  [name] PMR (polymyalgia rheumatica) (H[facility])    h/o in setting of crestor usage.  [name] PSVT (paroxysmal supraventricular tachycardia) ([facility])   . PUD (peptic ulcer disease)    remote history  . Raynaud's phenomenon   . Spinal stenosis   . Urine incontinence    H/O  . Vitamin D deficiency     HOSPITAL COURSE:   1. SOBAnd palpitations are assumed to be secondary to bleeding into -According to cardiology Symptoms should improve as she continues the medicatio -Continue Briltina without aspirin given allergy -Continue current medical  therapy -Add SL NTG for prn usage    2. CAD as above: -Continue medications Brilinta, bisoprolol, zetia -She does not want a stress test at this time -Cardiology did not recommend inpatient ischemic evaluation -We will discharge patient with sublingual nitroglycerin to take as needed for intermittent episodes of chest pain and outpatient follow-up with cardiac rehabilitation is recommended  3. HTN: -Continue home medications ramipril, bisoprolol, verapamil and spironolactone  4. Anxiety: -Continue patient's home medications and trazodone  DISCHARGE CONDITIONS:   fair  CONSULTS OBTAINED:  Treatment Team:  Wellington Hampshire, MD   PROCEDURES none  DRUG ALLERGIES:   Allergies  Allergen Reactions  . Beta Adrenergic Blockers Shortness Of Breath  . Albuterol     rigors  . Aspirin Other (See Comments)    Heartburn   . Penicillins Hives and Other (See Comments)    Has patient had a PCN reaction causing immediate rash, facial/tongue/throat swelling, SOB or lightheadedness with hypotension: No Has patient had a PCN reaction causing severe rash involving mucus membranes or skin necrosis: No Has patient had a PCN reaction that required hospitalization No Has patient had a PCN reaction occurring within the last 10 years: No If all of the above answers are "NO", then may proceed  with Cephalosporin use.   . Statins Other (See Comments)    Muscle weakness  . Sulfa Antibiotics Rash    At mouth  . Tetracycline Rash  . Tetracyclines & Related Rash    DISCHARGE MEDICATIONS:   Current Discharge Medication List    START taking these medications   Details  nitroGLYCERIN (NITROSTAT) 0.4 MG SL tablet Place 1 tablet (0.4 mg total) under the tongue every 5 (five) minutes as needed for chest pain. Place 1 tablet (0.4 mg total) under the tongue every 5 (five) minutes , 3 times as needed for chest pain. Qty: 15 tablet, Refills: 0      CONTINUE these medications which have NOT CHANGED    Details  acetaminophen (TYLENOL) 500 MG tablet Take 1,000 mg by mouth 2 (two) times daily.     amitriptyline (ELAVIL) 25 MG tablet Take 25 mg by mouth at bedtime.    b complex vitamins tablet Take 1 tablet by mouth daily.    bisoprolol (ZEBETA) 5 MG tablet Take 2.5 mg by mouth at bedtime.    Cholecalciferol (VITAMIN D3) 2000 UNITS TABS Take 1 capsule by mouth daily.     Coenzyme Q10 (COQ-10) 100 MG CAPS Take 1 tablet by mouth daily.    ezetimibe (ZETIA) 10 MG tablet Take 10 mg by mouth at bedtime.    folic acid (FOLVITE) 1 MG tablet take 1 tablet by mouth once daily Qty: 30 tablet, Refills: 5    hydrocortisone 2.5 % cream apply to VULVA twice a day AT 10AM AND 4 PM Qty: 28 g, Refills: 0    loratadine (CLARITIN) 10 MG tablet Take 10 mg by mouth daily.    Multiple Vitamins-Minerals (HAIR/SKIN/NAILS PO) Take 1 tablet by mouth daily.    Omega-3 Fatty Acids (FISH OIL PO) Take 2,000 mg by mouth daily.    pantoprazole (PROTONIX) 40 MG tablet Take 1 tablet (40 mg total) by mouth 2 (two) times daily. Qty: 60 tablet, Refills: 5    ramipril (ALTACE) 10 MG capsule Take 10 mg by mouth at bedtime.    spironolactone (ALDACTONE) 25 MG tablet take 1 tablet by mouth every morning Qty: 30 tablet, Refills: 5    ticagrelor (BRILINTA) 90 MG TABS tablet Take 1 tablet (90 mg total) by mouth 2 (two) times daily. Qty: 60 tablet, Refills: 2    traZODone (DESYREL) 150 MG tablet Take 275 mg by mouth at bedtime. Patient takes 1 and 2/3 tablets     verapamil (CALAN-SR) 180 MG CR tablet take 1 tablet by mouth twice a day Qty: 60 tablet, Refills: 5    vitamin B-12 (CYANOCOBALAMIN) 1000 MCG tablet Take 1,000 mcg by mouth daily.    vitamin C (ASCORBIC ACID) 500 MG tablet Take 500 mg by mouth at bedtime.    ketoconazole (NIZORAL) 2 % cream Apply 1 application topically daily. Qty: 30 g, Refills: 0    meclizine (ANTIVERT) 25 MG tablet Take 25 mg by mouth 3 (three) times daily as needed for dizziness.     miconazole (MICOTIN) 2 % cream Apply 1 application topically 2 (two) times daily as needed (irritation).    Polyethylene Glycol 3350 (MIRALAX PO) Take 8.5 g by mouth at bedtime.     sucralfate (CARAFATE) 1 g tablet Take 1 g by mouth 2 (two) times daily as needed (acid reflux).    traMADol (ULTRAM) 50 MG tablet Take 1 tablet (50 mg total) by mouth daily as needed. Qty: 20 tablet, Refills: 0  DISCHARGE INSTRUCTIONS:  Activity as tolerated Outpatient follow-up with primary care physician in a week Outpatient follow-up with the primary cardiology Dr. Solmon Ice on September 5 as scheduled Outpatient cardiology rehabilitation follow-up is needed in a week   DIET:  Cardiac diet  DISCHARGE CONDITION:  Fair  ACTIVITY:  Activity as tolerated  OXYGEN:  Home Oxygen: No.   Oxygen Delivery: room air  DISCHARGE LOCATION:  home   If you experience worsening of your admission symptoms, develop shortness of breath, life threatening emergency, suicidal or homicidal thoughts you must seek medical attention immediately by calling 911 or calling your MD immediately  if symptoms less severe.  You Must read complete instructions/literature along with all the possible adverse reactions/side effects for all the Medicines you take and that have been prescribed to you. Take any new Medicines after you have completely understood and accpet all the possible adverse reactions/side effects.   Please note  You were cared for by a hospitalist during your hospital stay. If you have any questions about your discharge medications or the care you received while you were in the hospital after you are discharged, you can call the unit and asked to speak with the hospitalist on call if the hospitalist that took care of you is not available. Once you are discharged, your primary care physician will handle any further medical issues. Please note that NO REFILLS for any discharge medications will be  authorized once you are discharged, as it is imperative that you return to your primary care physician (or establish a relationship with a primary care physician if you do not have one) for your aftercare needs so that they can reassess your need for medications and monitor your lab values.     Today  Chief Complaint  Patient presents with  . Chest Pain   Patient is resting comfortably during my examination. Denies any chest pain or shortness of breath. Seen and evaluated by cardiology and shortness of breath was thought to be from [facility] and they have recommended to continue Brilinta as prescribed.  ROS:  CONSTITUTIONAL: Denies fevers, chills. Denies any fatigue, weakness.  EYES: Denies blurry vision, double vision, eye pain. EARS, NOSE, THROAT: Denies tinnitus, ear pain, hearing loss. RESPIRATORY: Denies cough, wheeze, shortness of breath.  CARDIOVASCULAR: Denies chest pain, palpitations, edema.  GASTROINTESTINAL: Denies nausea, vomiting, diarrhea, abdominal pain. Denies bright red blood per rectum. GENITOURINARY: Denies dysuria, hematuria. ENDOCRINE: Denies nocturia or thyroid problems. HEMATOLOGIC AND LYMPHATIC: Denies easy bruising or bleeding. SKIN: Denies rash or lesion. MUSCULOSKELETAL: Denies pain in neck, back, shoulder, knees, hips or arthritic symptoms.  NEUROLOGIC: Denies paralysis, paresthesias.  PSYCHIATRIC: Denies anxiety or depressive symptoms.   VITAL SIGNS:  Blood pressure (!) 140/59, pulse (!) 56, temperature 98.2 F (36.8 C), temperature source Oral, resp. rate 16, height 5\' 3"  (1.6 m), weight 81.9 kg (180 lb 8 oz), SpO2 98 %.  I/O:    Intake/Output Summary (Last 24 hours) at 06/07/16 1245 Last data filed at 06/07/16 1107  Gross per 24 hour  Intake              480 ml  Output             1400 ml  Net             -920 ml    PHYSICAL EXAMINATION:  GENERAL:  80 y.o.-year-old patient lying in the bed with no acute distress.  EYES: Pupils equal, round,  reactive to light and accommodation. No scleral  icterus. Extraocular muscles intact.  HEENT: Head atraumatic, normocephalic. Oropharynx and nasopharynx clear.  NECK:  Supple, no jugular venous distention. No thyroid enlargement, no tenderness.  LUNGS: Normal breath sounds bilaterally, no wheezing, rales,rhonchi or crepitation. No use of accessory muscles of respiration.  CARDIOVASCULAR: S1, S2 normal. No murmurs, rubs, or gallops.  ABDOMEN: Soft, non-tender, non-distended. Bowel sounds present. No organomegaly or mass.  EXTREMITIES: No pedal edema, cyanosis, or clubbing.  NEUROLOGIC: Cranial nerves II through XII are intact. Muscle strength 5/5 in all extremities. Sensation intact. Gait not checked.  PSYCHIATRIC: The patient is alert and oriented x 3.  SKIN: No obvious rash, lesion, or ulcer.   DATA REVIEW:   CBC  Recent Labs Lab 06/06/16 1855  WBC 4.2  HGB 12.2  HCT 33.9*  PLT 206    Chemistries   Recent Labs Lab 06/06/16 1855  NA 133*  K 3.9  CL 101  CO2 23  GLUCOSE 89  BUN 17  CREATININE 0.82  CALCIUM 9.4    Cardiac Enzymes  Recent Labs Lab 06/06/16 1855  TROPONINI <0.03    Microbiology Results  Results for orders placed or performed during the hospital encounter of 05/31/16  MRSA PCR Screening     Status: None   Collection Time: 06/01/16  1:40 AM  Result Value Ref Range Status   MRSA by PCR NEGATIVE NEGATIVE Final    Comment:        The GeneXpert MRSA Assay (FDA approved for NASAL specimens only), is one component of a comprehensive MRSA colonization surveillance program. It is not intended to diagnose MRSA infection nor to guide or monitor treatment for MRSA infections.     RADIOLOGY:  Dg Chest 2 View  Result Date: 06/06/2016 CLINICAL DATA:  Short of breath EXAM: CHEST  2 VIEW COMPARISON:  05/31/2016 FINDINGS: Normal heart size. Lungs clear. No pneumothorax. No pleural effusion. Postoperative changes from CABG. IMPRESSION: No active  cardiopulmonary disease. Electronically Signed   By: Marybelle Killings M.D.   On: 06/06/2016 19:46    EKG:   Orders placed or performed during the hospital encounter of 06/06/16  . EKG 12-Lead  . EKG 12-Lead  . ED EKG  . ED EKG      Management plans discussed with the patient, family and they are in agreement.  CODE STATUS:     Code Status Orders        Start     Ordered   06/07/16 1022  Full code  Continuous     06/07/16 1022    Code Status History    Date Active Date Inactive Code Status Order ID Comments User Context   06/06/2016 10:11 PM 06/07/2016 10:21 AM DNR [id number]  Max Sane, MD ED   06/01/2016 12:19 AM 06/01/2016  1:56 PM Full Code [id number]  Lance Coon, MD Inpatient    Advance Directive Documentation   Flowsheet Row Most Recent Value  Type of Advance Directive  Healthcare Power of Attorney  Pre-existing out of facility DNR order (yellow form or pink MOST form)  No data  "MOST" Form in Place?  No data      TOTAL TIME TAKING CARE OF THIS PATIENT: 45  minutes.   Note: This dictation was prepared with Dragon dictation along with smaller phrase technology. Any transcriptional errors that result from this process are unintentional.   @[facility] @  on 06/07/2016 at 12:45 PM  Between 7am to 6pm - Pager - [phone]  After 6pm go to [email or web address] - password [email or web address]  [facility]  Office  [phone]  CC: Primary care physician; [name], MD

## 2016-06-07 NOTE — Care Management Obs Status (Signed)
[facility] NOTIFICATION   Patient Details  Name: Charlotte Wagner MRN: [id number] Date of Birth: 01-30-1936   Medicare Observation Status Notification Given:  Yes    Katrina Stack, RN 06/07/2016, 12:06 PM

## 2016-06-10 ENCOUNTER — Other Ambulatory Visit: Payer: Self-pay | Admitting: Internal Medicine

## 2016-06-12 ENCOUNTER — Other Ambulatory Visit
Admission: RE | Admit: 2016-06-12 | Discharge: 2016-06-12 | Disposition: A | Discharge status: Home / Self Care | Payer: Medicare Other | Source: Ambulatory Visit | Attending: Cardiology | Admitting: Cardiology

## 2016-06-12 ENCOUNTER — Encounter: Payer: Self-pay | Admitting: Cardiology

## 2016-06-12 ENCOUNTER — Ambulatory Visit (INDEPENDENT_AMBULATORY_CARE_PROVIDER_SITE_OTHER): Payer: Medicare Other | Admitting: Cardiology

## 2016-06-12 VITALS — BP 104/68 | HR 67 | Ht 63.0 in | Wt 184.0 lb

## 2016-06-12 DIAGNOSIS — R0602 Shortness of breath: Secondary | ICD-10-CM | POA: Insufficient documentation

## 2016-06-12 DIAGNOSIS — Z9861 Coronary angioplasty status: Secondary | ICD-10-CM

## 2016-06-12 DIAGNOSIS — R002 Palpitations: Secondary | ICD-10-CM | POA: Diagnosis not present

## 2016-06-12 DIAGNOSIS — I2510 Atherosclerotic heart disease of native coronary artery without angina pectoris: Secondary | ICD-10-CM

## 2016-06-12 DIAGNOSIS — Z951 Presence of aortocoronary bypass graft: Secondary | ICD-10-CM

## 2016-06-12 DIAGNOSIS — I10 Essential (primary) hypertension: Secondary | ICD-10-CM

## 2016-06-12 DIAGNOSIS — I200 Unstable angina: Secondary | ICD-10-CM

## 2016-06-12 DIAGNOSIS — E785 Hyperlipidemia, unspecified: Secondary | ICD-10-CM

## 2016-06-12 LAB — CBC WITH DIFFERENTIAL/PLATELET
BASOS PCT: 0 %
Basophils Absolute: 0 10*3/uL (ref 0–0.1)
Eosinophils Absolute: 0.1 10*3/uL (ref 0–0.7)
Eosinophils Relative: 2 %
HEMATOCRIT: 35.3 % (ref 35.0–47.0)
Hemoglobin: 12.5 g/dL (ref 12.0–16.0)
LYMPHS PCT: 11 %
Lymphs Abs: 0.5 10*3/uL — ABNORMAL LOW (ref 1.0–3.6)
MCH: 35.1 pg — AB (ref 26.0–34.0)
MCHC: 35.4 g/dL (ref 32.0–36.0)
MCV: 99.1 fL (ref 80.0–100.0)
MONO ABS: 0.6 10*3/uL (ref 0.2–0.9)
MONOS PCT: 12 %
NEUTROS ABS: 3.6 10*3/uL (ref 1.4–6.5)
Neutrophils Relative %: 75 %
Platelets: 240 10*3/uL (ref 150–440)
RBC: 3.57 MIL/uL — ABNORMAL LOW (ref 3.80–5.20)
RDW: 12.4 % (ref 11.5–14.5)
WBC: 4.8 10*3/uL (ref 3.6–11.0)

## 2016-06-12 LAB — BASIC METABOLIC PANEL
Anion gap: 5 (ref 5–15)
BUN: 24 mg/dL — AB (ref 6–20)
CALCIUM: 9.6 mg/dL (ref 8.9–10.3)
CO2: 26 mmol/L (ref 22–32)
CREATININE: 0.95 mg/dL (ref 0.44–1.00)
Chloride: 103 mmol/L (ref 101–111)
GFR calc Af Amer: >60 mL/min (ref >60)
GFR calc non Af Amer: 56 mL/min — ABNORMAL LOW (ref >60)
GLUCOSE: 101 mg/dL — AB (ref 65–99)
Potassium: 4.3 mmol/L (ref 3.5–5.1)
Sodium: 134 mmol/L — ABNORMAL LOW (ref 135–145)

## 2016-06-12 NOTE — Patient Instructions (Signed)
Labwork: Please go to [facility]tr[facility] of the hospital and check in at first desk and let them know you need to have labs drawn.   Testing/Procedures: Your physician has requested that you have an echocardiogram. Echocardiography is a painless test that uses sound waves to create images of your heart. It provides your doctor with information about the size and shape of your heart and how well your heart's chambers and valves are working. This procedure takes approximately one hour. There are no restrictions for this procedure.  You have been referred to Cardiac Rehab. Someone should be calling you to get this set up.    Follow-Up: Your physician recommends that you schedule a follow-up appointment in: 1 month with Dr. Yvone Neu.  It was a pleasure seeing you today here in the office. Please do not hesitate to give [place] a call back if you have any further questions. [place], BSN

## 2016-06-14 ENCOUNTER — Encounter: Payer: Self-pay | Admitting: Internal Medicine

## 2016-06-14 ENCOUNTER — Ambulatory Visit (INDEPENDENT_AMBULATORY_CARE_PROVIDER_SITE_OTHER): Payer: Medicare Other | Admitting: Internal Medicine

## 2016-06-14 DIAGNOSIS — Z23 Encounter for immunization: Secondary | ICD-10-CM

## 2016-06-14 DIAGNOSIS — M4800 Spinal stenosis, site unspecified: Secondary | ICD-10-CM | POA: Diagnosis not present

## 2016-06-14 DIAGNOSIS — R0602 Shortness of breath: Secondary | ICD-10-CM

## 2016-06-14 DIAGNOSIS — I2510 Atherosclerotic heart disease of native coronary artery without angina pectoris: Secondary | ICD-10-CM

## 2016-06-14 DIAGNOSIS — E7800 Pure hypercholesterolemia, unspecified: Secondary | ICD-10-CM

## 2016-06-14 NOTE — Progress Notes (Signed)
Pre visit review using our clinic review tool, if applicable. No additional management support is needed unless otherwise documented below in the visit note. 

## 2016-06-14 NOTE — Progress Notes (Signed)
Patient ID: Charlotte Wagner, female   DOB: 07-21-1936, 80 y.o.   MRN: [id number]   Subjective:    Patient ID: Charlotte Wagner, female    DOB: 30-Sep-1936, 80 y.o.   MRN: [id number]  HPI  Patient here for hospital follow f/u.  Was hospitalized 05/31/16 with chest pain.  S/p cardiac cath which revelaed 90% occlusion to SVG graft to RCA and stent placed.  Started on Brilinta.  Returned to ER on 06/06/16 with sob.  She relates the sob to Brilinta.  Saw cardiology 06/12/16.  Note reviewed.  Felt stable.  Recommended to continue Brilinta and other medications as she was doing.  She is moving more.  Energy is improving gradually.  Breathing is better.  No acid reflux.  No abdominal pain or cramping.  Bowels stable.     Past Medical History:  Diagnosis Date  . Allergy   . Arthritis    s/p bilateral knees and left hip replacement  . Asthma   . CAD (coronary artery disease)    a. 12/1996 s/p CABG x4 ([place]);  b. 2005 Pt reports stress test & cath, which revealed patent grafts.  . Cancer ([place])    melanoma right arm  . Carotid arterial disease ([place])    a. 04/2015 Carotid U/S: <50% bilat ICA stenosis.  . Chronic kidney disease   . Colon polyps    H/O  . Depression   . GERD (gastroesophageal reflux disease)    h/o hiatal hernia  . Headache    migraines in past  . Heart murmur    a. 04/2011 Echo: EF 55-60%, bilat atrial enlargement, mild to mod TR.  [name] History of chicken pox   . History of hiatal hernia   . Hx of migraines    rare now  . Hx: UTI (urinary tract infection)   . Hyperlipidemia    a. Statin intolerant -->on zetia.  . Hypertensive heart disease   . Lichen planus   . Melanoma ([place])   . Palpitations    a. rare PVC's and h/o SVT.  [name] PMR (polymyalgia rheumatica) ([facility])    h/o in setting of crestor usage.  [name] PSVT (paroxysmal supraventricular tachycardia) ([place])   . PUD (peptic ulcer disease)    remote history  . Raynaud's phenomenon   . Spinal stenosis   . Urine incontinence    H/O    . Vitamin D deficiency    Past Surgical History:  Procedure Laterality Date  . ADENOIDECTOMY     age 37  . BACK SURGERY     L3-L5  . BILATERAL CARPAL TUNNEL RELEASE    . BREAST BIOPSY Right    bx x 3 neg  . BREAST SURGERY Right    biopsy x 3 (all benign)  . CARDIAC CATHETERIZATION N/A 06/01/2016   Procedure: LEFT HEART CATH AND CORS/GRAFTS ANGIOGRAPHY;  Surgeon: Wellington Hampshire, MD;  Location: [facility];  Service: Cardiovascular;  Laterality: N/A;  . CARDIAC CATHETERIZATION N/A 06/01/2016   Procedure: Coronary Stent Intervention;  Surgeon: Wellington Hampshire, MD;  Location: [facility];  Service: Cardiovascular;  Laterality: N/A;  . CATARACT EXTRACTION W/PHACO Right 01/04/2016   Procedure: CATARACT EXTRACTION PHACO AND INTRAOCULAR LENS PLACEMENT (IOC);  Surgeon: Leandrew Koyanagi, MD;  Location: [facility];  Service: Ophthalmology;  Laterality: Right;  MALYUGIN  . CATARACT EXTRACTION W/PHACO Left 01/25/2016   Procedure: CATARACT EXTRACTION PHACO AND INTRAOCULAR LENS PLACEMENT (IOC) left eye;  Surgeon: Leandrew Koyanagi, MD;  Location: [facility];  Service: Ophthalmology;  Laterality: Left;  MALYUGIN SHUGARCAINE  . CHOLECYSTECTOMY  90's  . COLONOSCOPY WITH PROPOFOL N/A 05/03/2015   Procedure: COLONOSCOPY WITH PROPOFOL;  Surgeon: Lollie Sails, MD;  Location[facility];  Service: Endoscopy;  Laterality: N/A;  . CORONARY ARTERY BYPASS GRAFT  98   4 vessel  . ESOPHAGOGASTRODUODENOSCOPY N/A 03/22/2015   Procedure: ESOPHAGOGASTRODUODENOSCOPY (EGD);  Surgeon: Lollie Sails, MD;  Location: [facility] ENDOSCOPY;  Service: Endoscopy;  Laterality: N/A;  . JOINT REPLACEMENT     BILATERAL KNEE REPLACEMENTS  . KNEE ARTHROSCOPY W/ OATS PROCEDURE     Lt knee (9/01), Rt knee (3/11), Lt hip (5/10)  . TOTAL HIP ARTHROPLASTY     Family History  Problem Relation Age of Onset  . Thyroid disease Mother     graves disease  . Heart disease Father     rheumatic  heart  . Alcohol abuse Father   . Depression Father   . Lung cancer Sister   . Depression Daughter   . Thyroid disease Daughter     hashimoto  . Depression Son   . Stroke Maternal Grandmother   . Depression Maternal Grandmother   . Hypertension Maternal Grandfather   . Hypertension Paternal Grandfather   . Breast cancer Neg Hx    Social History   Social History  . Marital status: Married    Spouse name: N/A  . Number of children: 3  . Years of education: N/A   Occupational History  . Retired Sport and exercise psychologist    Social History Main Topics  . Smoking status: Former Research scientist (life sciences)  . Smokeless tobacco: Never Used     Comment: quit 44+ yrs ago  . Alcohol use No  . Drug use: No  . Sexual activity: No   Other Topics Concern  . None   Social History Narrative   Lives in [place].  Retired Engineer, drilling.  Relatively active.    Outpatient Encounter Prescriptions as of 06/14/2016  Medication Sig  . acetaminophen (TYLENOL) 500 MG tablet Take 1,000 mg by mouth 2 (two) times daily.   [name] amitriptyline (ELAVIL) 25 MG tablet Take 25 mg by mouth at bedtime.  [name] b complex vitamins tablet Take 1 tablet by mouth daily.  . bisoprolol (ZEBETA) 5 MG tablet Take 2.5 mg by mouth at bedtime.  . Cholecalciferol (VITAMIN D3) 2000 UNITS TABS Take 1 capsule by mouth daily.   . Coenzyme Q10 (COQ-10) 100 MG CAPS Take 1 tablet by mouth daily.  [name] ezetimibe (ZETIA) 10 MG tablet Take 10 mg by mouth at bedtime.  . folic acid (FOLVITE) 1 MG tablet take 1 tablet by mouth once daily  . hydrocortisone 2.5 % cream apply to VULVA twice a day AT 10AM AND 4 PM  . ketoconazole (NIZORAL) 2 % cream Apply 1 application topically daily. (Patient taking differently: Apply 1 application topically daily as needed for irritation. )  . loratadine (CLARITIN) 10 MG tablet Take 10 mg by mouth daily.  . meclizine (ANTIVERT) 25 MG tablet Take 25 mg by mouth 3 (three) times daily as needed for dizziness.  . miconazole (MICOTIN) 2 %  cream Apply 1 application topically 2 (two) times daily as needed (irritation).  . Multiple Vitamins-Minerals (HAIR/SKIN/NAILS PO) Take 1 tablet by mouth daily.  . nitroGLYCERIN (NITROSTAT) 0.4 MG SL tablet Place 1 tablet (0.4 mg total) under the tongue every 5 (five) minutes as needed for chest pain. Place 1 tablet (0.4 mg total) under the tongue  every 5 (five) minutes , 3 times as needed for chest pain.  . Omega-3 Fatty Acids (FISH OIL PO) Take 2,000 mg by mouth daily.  . pantoprazole (PROTONIX) 40 MG tablet Take 1 tablet (40 mg total) by mouth 2 (two) times daily.  . Polyethylene Glycol 3350 (MIRALAX PO) Take 8.5 g by mouth at bedtime.   . ramipril (ALTACE) 10 MG capsule Take 10 mg by mouth at bedtime.  [name] spironolactone (ALDACTONE) 25 MG tablet take 1 tablet by mouth every morning  . sucralfate (CARAFATE) 1 g tablet Take 1 g by mouth 2 (two) times daily as needed (acid reflux).  . ticagrelor (BRILINTA) 90 MG TABS tablet Take 1 tablet (90 mg total) by mouth 2 (two) times daily.  . traMADol (ULTRAM) 50 MG tablet Take 1 tablet (50 mg total) by mouth daily as needed. (Patient taking differently: Take 50 mg by mouth daily as needed for moderate pain. )  . traZODone (DESYREL) 150 MG tablet Take 275 mg by mouth at bedtime. Patient takes 1 and 2/3 tablets   . verapamil (CALAN-SR) 180 MG CR tablet take 1 tablet by mouth twice a day  . vitamin B-12 (CYANOCOBALAMIN) 1000 MCG tablet Take 1,000 mcg by mouth daily.  . vitamin C (ASCORBIC ACID) 500 MG tablet Take 500 mg by mouth at bedtime.   No facility-administered encounter medications on file as of 06/14/2016.     Review of Systems  Constitutional: Negative for appetite change and unexpected weight change.  HENT: Negative for congestion and sinus pressure.   Respiratory: Positive for shortness of breath. Negative for cough and chest tightness.   Cardiovascular: Negative for chest pain, palpitations and leg swelling.  Gastrointestinal: Negative for  abdominal pain, diarrhea, nausea and vomiting.  Genitourinary: Negative for difficulty urinating and dysuria.  Musculoskeletal: Positive for back pain (chronic). Negative for myalgias.  Skin: Negative for color change and rash.  Neurological: Negative for dizziness, light-headedness and headaches.  Psychiatric/Behavioral: Negative for agitation and dysphoric mood.       Objective:    Physical Exam  Constitutional: She appears well-developed and well-nourished. No distress.  HENT:  Nose: Nose normal.  Mouth/Throat: Oropharynx is clear and moist.  Neck: Neck supple. No thyromegaly present.  Cardiovascular: Normal rate and regular rhythm.   Pulmonary/Chest: Breath sounds normal. No respiratory distress. She has no wheezes.  Abdominal: Soft. Bowel sounds are normal. There is no tenderness.  Musculoskeletal: She exhibits no edema or tenderness.  Lymphadenopathy:    She has no cervical adenopathy.  Skin: No rash noted. No erythema.  Psychiatric: She has a normal mood and affect. Her behavior is normal.    BP 100/64 (BP Location: Left Arm, Patient Position: Standing)   Pulse 65   Temp 97.7 F (36.5 C) (Oral)   Ht 5\' 3"  (1.6 m)   Wt 185 lb 9.6 oz (84.2 kg)   SpO2 98%   BMI 32.88 kg/m  Wt Readings from Last 3 Encounters:  06/14/16 185 lb 9.6 oz (84.2 kg)  06/12/16 184 lb (83.5 kg)  06/06/16 180 lb 8 oz (81.9 kg)     Lab Results  Component Value Date   WBC 4.8 06/12/2016   HGB 12.5 06/12/2016   HCT 35.3 06/12/2016   PLT 240 06/12/2016   GLUCOSE 101 (H) 06/12/2016   CHOL 221 (H) 03/26/2016   TRIG 119.0 03/26/2016   HDL 54.80 03/26/2016   LDLDIRECT 126.7 07/03/2013   LDLCALC 143 (H) 03/26/2016   ALT 18 03/26/2016  AST 20 03/26/2016   NA 134 (L) 06/12/2016   K 4.3 06/12/2016   CL 103 06/12/2016   CREATININE 0.95 06/12/2016   BUN 24 (H) 06/12/2016   CO2 26 06/12/2016   TSH 1.54 09/12/2015       Assessment & Plan:   Problem List Items Addressed This Visit     CAD (coronary artery disease)    Hospitalized 05/31/16 with chest pain.  S/p cardiac cath which revealed 90% occlusion to SVG graft to RCA.  S/p stent placement.  Started on brilinta.  Some sob.  Is better.  Seeing cardiology.  Just evaluated.  Continue risk factor modification.       Hypercholesterolemia    Unable to take statin medications.  Low cholesterol diet and exercise.  On zetia.  Follow lipid panel.        SOB (shortness of breath)    SOB is better.  She relates to taking Brilinta.  Is better.  Just saw cardiology.        Spinal stenosis    Has chronic back pain.  Walks with a Programmer, multimedia.  Has desired no further intervention.         Other Visit Diagnoses    Encounter for immunization       Relevant Orders   Flu vaccine HIGH DOSE PF (Completed)       [name], MD

## 2016-06-17 ENCOUNTER — Telehealth: Payer: Self-pay | Admitting: Family

## 2016-06-17 ENCOUNTER — Encounter: Payer: Self-pay | Admitting: Internal Medicine

## 2016-06-17 NOTE — Assessment & Plan Note (Signed)
Hospitalized 05/31/16 with chest pain.  S/p cardiac cath which revealed 90% occlusion to SVG graft to RCA.  S/p stent placement.  Started on brilinta.  Some sob.  Is better.  Seeing cardiology.  Just evaluated.  Continue risk factor modification.

## 2016-06-17 NOTE — Assessment & Plan Note (Signed)
SOB is better.  She relates to taking Brilinta.  Is better.  Just saw cardiology.

## 2016-06-17 NOTE — Assessment & Plan Note (Signed)
Has chronic back pain.  Walks with a Programmer, multimedia.  Has desired no further intervention.

## 2016-06-17 NOTE — Assessment & Plan Note (Signed)
Unable to take statin medications.  Low cholesterol diet and exercise.  On zetia.  Follow lipid panel.

## 2016-06-19 ENCOUNTER — Ambulatory Visit: Payer: Medicare Other | Admitting: Cardiology

## 2016-06-19 ENCOUNTER — Ambulatory Visit (INDEPENDENT_AMBULATORY_CARE_PROVIDER_SITE_OTHER): Payer: Medicare Other

## 2016-06-19 ENCOUNTER — Other Ambulatory Visit: Payer: Self-pay

## 2016-06-19 DIAGNOSIS — R0602 Shortness of breath: Secondary | ICD-10-CM

## 2016-06-19 LAB — ECHOCARDIOGRAM COMPLETE
AOASC: 34 cm
AVLVOTPG: 5 mmHg
CHL CUP DOP CALC LVOT VTI: 29.5 cm
EERAT: 7.11
EWDT: 193 ms
FS: 43 % (ref 28–44)
IVS/LV PW RATIO, ED: 1.09
LA ID, A-P, ES: 40 mm
LA vol index: 54 mL/m2
LA vol: 101 mL
LADIAMINDEX: 2.14 cm/m2
LAVOLA4C: 98.8 mL
LEFT ATRIUM END SYS DIAM: 40 mm
LV E/e' medial: 7.11
LV PW d: 10.7 mm — AB (ref 0.6–1.1)
LV SIMPSON'S DISK: 57
LV TDI E'MEDIAL: 5.22
LV dias vol index: 56 mL/m2
LV dias vol: 104 mL (ref 46–106)
LV e' LATERAL: 13.3 cm/s
LVEEAVG: 7.11
LVOT SV: 102 mL
LVOT area: 3.46 cm2
LVOT peak vel: 114 cm/s
LVOTD: 21 mm
LVSYSVOL: 45 mL — AB (ref 14–42)
LVSYSVOLIN: 24 mL/m2
MV Dec: 193
MV Peak grad: 4 mmHg
MV pk A vel: 45.6 m/s
MVPKEVEL: 94.6 m/s
PV Reg vel dias: 103 cm/s
RV TAPSE: 17.5 mm
Reg peak vel: 293 cm/s
Stroke v: 59 ml
TDI e' lateral: 13.3
TRMAXVEL: 293 cm/s

## 2016-06-21 ENCOUNTER — Encounter: Payer: Medicare Other | Attending: Cardiovascular Disease | Admitting: *Deleted

## 2016-06-21 VITALS — Ht 63.5 in | Wt 184.5 lb

## 2016-06-21 DIAGNOSIS — Z955 Presence of coronary angioplasty implant and graft: Secondary | ICD-10-CM | POA: Diagnosis not present

## 2016-06-21 DIAGNOSIS — Z9861 Coronary angioplasty status: Secondary | ICD-10-CM

## 2016-06-21 NOTE — Patient Instructions (Signed)
Patient Instructions  Patient Details  Name: TIERZA STORES MRN: [id number] Date of Birth: [birth year] Referring Provider:  Wellington Hampshire, MD  Below are the personal goals you chose as well as exercise and nutrition goals. Our goal is to help you keep on track towards obtaining and maintaining your goals. We will be discussing your progress on these goals with you throughout the program.  Initial Exercise Prescription:     Initial Exercise Prescription - 06/21/16 1400      Date of Initial Exercise RX and Referring Provider   Date 06/21/16   Referring Provider Kathlyn Sacramento MD     Treadmill   MPH 1   Grade 0   Minutes 15   METs 1.77     NuStep   Level 2   Minutes 15   METs 1.8     Biostep-RELP   Level 2   Minutes 15   METs 2     Prescription Details   Frequency (times per week) 3   Duration Progress to 45 minutes of aerobic exercise without signs/symptoms of physical distress     Intensity   THRR 40-80% of Max Heartrate 102-129   Ratings of Perceived Exertion 11-15   Perceived Dyspnea 0-4     Progression   Progression Continue to progress workloads to maintain intensity without signs/symptoms of physical distress.     Resistance Training   Training Prescription Yes   Weight 2 lbs   Reps 10-12      Exercise Goals: Frequency: Be able to perform aerobic exercise three times per week working toward 3-5 days per week.  Intensity: Work with a perceived exertion of 11 (fairly light) - 15 (hard) as tolerated. Follow your new exercise prescription and watch for changes in prescription as you progress with the program. Changes will be reviewed with you when they are made.  Duration: You should be able to do 30 minutes of continuous aerobic exercise in addition to a 5 minute warm-up and a 5 minute cool-down routine.  Nutrition Goals: Your personal nutrition goals will be established when you do your nutrition analysis with the dietician.  The following are  nutrition guidelines to follow: Cholesterol < 200mg /day Sodium < 1500mg /day Fiber: Women over 50 yrs - 21 grams per day  Personal Goals:     Personal Goals and Risk Factors at Admission - 06/21/16 1440      Core Components/Risk Factors/Patient Goals on Admission   Increase Strength and Stamina Yes   Intervention Provide advice, education, support and counseling about physical activity/exercise needs.;Develop an individualized exercise prescription for aerobic and resistive training based on initial evaluation findings, risk stratification, comorbidities and participant's personal goals.   Expected Outcomes Achievement of increased cardiorespiratory fitness and enhanced flexibility, muscular endurance and strength shown through measurements of functional capacity and personal statement of participant.   Hypertension Yes   Intervention Provide education on lifestyle modifcations including regular physical activity/exercise, weight management, moderate sodium restriction and increased consumption of fresh fruit, vegetables, and low fat dairy, alcohol moderation, and smoking cessation.;Monitor prescription use compliance.   Expected Outcomes Short Term: Continued assessment and intervention until BP is < 140/29mm HG in hypertensive participants. < 130/46mm HG in hypertensive participants with diabetes, heart failure or chronic kidney disease.;Long Term: Maintenance of blood pressure at goal levels.   Lipids Yes   Intervention Provide education and support for participant on nutrition & aerobic/resistive exercise along with prescribed medications to achieve LDL 70mg , HDL >40mg .   Expected Outcomes  Short Term: Participant states understanding of desired cholesterol values and is compliant with medications prescribed. Participant is following exercise prescription and nutrition guidelines.;Long Term: Cholesterol controlled with medications as prescribed, with individualized exercise RX and with  personalized nutrition plan. Value goals: LDL < 70mg , HDL > 40 mg.      Tobacco Use Initial Evaluation: History  Smoking Status  . Former Smoker  Smokeless Tobacco  . Never Used    Comment: quit 44+ yrs ago    Copy of goals given to participant.

## 2016-06-21 NOTE — Progress Notes (Signed)
Cardiac Individual Treatment Plan  Patient Details  Name: Charlotte Wagner MRN: [id number] Date of Birth: 1936-01-31 Referring Provider:   Flowsheet Row Cardiac Rehab from 06/21/2016 in [facility] Cardiac and Pulmonary Rehab  Referring Provider  Kathlyn Sacramento MD      Initial Encounter Date:  Flowsheet Row Cardiac Rehab from 06/21/2016 in [facility] Cardiac and Pulmonary Rehab  Date  06/21/16  Referring Provider  Kathlyn Sacramento MD      Visit Diagnosis: S/P PTCA (percutaneous transluminal coronary angioplasty)  Patient's Home Medications on Admission:  Current Outpatient Prescriptions:  .  acetaminophen (TYLENOL) 500 MG tablet, Take 1,000 mg by mouth 2 (two) times daily. , Disp: , Rfl:  .  amitriptyline (ELAVIL) 25 MG tablet, Take 25 mg by mouth at bedtime., Disp: , Rfl:  .  b complex vitamins tablet, Take 1 tablet by mouth daily., Disp: , Rfl:  .  bisoprolol (ZEBETA) 5 MG tablet, Take 2.5 mg by mouth at bedtime., Disp: , Rfl:  .  Cholecalciferol (VITAMIN D3) 2000 UNITS TABS, Take 1 capsule by mouth daily. , Disp: , Rfl:  .  Coenzyme Q10 (COQ-10) 100 MG CAPS, Take 1 tablet by mouth daily., Disp: , Rfl:  .  ezetimibe (ZETIA) 10 MG tablet, Take 10 mg by mouth at bedtime., Disp: , Rfl:  .  folic acid (FOLVITE) 1 MG tablet, take 1 tablet by mouth once daily, Disp: 30 tablet, Rfl: 5 .  hydrocortisone 2.5 % cream, apply to VULVA twice a day AT 10AM AND 4 PM, Disp: 28 g, Rfl: 0 .  ketoconazole (NIZORAL) 2 % cream, Apply 1 application topically daily. (Patient taking differently: Apply 1 application topically daily as needed for irritation. ), Disp: 30 g, Rfl: 0 .  loratadine (CLARITIN) 10 MG tablet, Take 10 mg by mouth daily., Disp: , Rfl:  .  meclizine (ANTIVERT) 25 MG tablet, Take 25 mg by mouth 3 (three) times daily as needed for dizziness., Disp: , Rfl:  .  miconazole (MICOTIN) 2 % cream, Apply 1 application topically 2 (two) times daily as needed (irritation)., Disp: , Rfl:  .  Multiple  Vitamins-Minerals (HAIR/SKIN/NAILS PO), Take 1 tablet by mouth daily., Disp: , Rfl:  .  nitroGLYCERIN (NITROSTAT) 0.4 MG SL tablet, Place 1 tablet (0.4 mg total) under the tongue every 5 (five) minutes as needed for chest pain. Place 1 tablet (0.4 mg total) under the tongue every 5 (five) minutes , 3 times as needed for chest pain., Disp: 15 tablet, Rfl: 0 .  Omega-3 Fatty Acids (FISH OIL PO), Take 2,000 mg by mouth daily., Disp: , Rfl:  .  pantoprazole (PROTONIX) 40 MG tablet, Take 1 tablet (40 mg total) by mouth 2 (two) times daily., Disp: 60 tablet, Rfl: 5 .  Polyethylene Glycol 3350 (MIRALAX PO), Take 8.5 g by mouth at bedtime. , Disp: , Rfl:  .  ramipril (ALTACE) 10 MG capsule, Take 10 mg by mouth at bedtime., Disp: , Rfl:  .  spironolactone (ALDACTONE) 25 MG tablet, take 1 tablet by mouth every morning, Disp: 30 tablet, Rfl: 5 .  sucralfate (CARAFATE) 1 g tablet, Take 1 g by mouth 2 (two) times daily as needed (acid reflux)., Disp: , Rfl:  .  ticagrelor (BRILINTA) 90 MG TABS tablet, Take 1 tablet (90 mg total) by mouth 2 (two) times daily., Disp: 60 tablet, Rfl: 2 .  traMADol (ULTRAM) 50 MG tablet, Take 1 tablet (50 mg total) by mouth daily as needed. (Patient taking differently: Take 50 mg  by mouth daily as needed for moderate pain. ), Disp: 20 tablet, Rfl: 0 .  traZODone (DESYREL) 150 MG tablet, Take 275 mg by mouth at bedtime. Patient takes 1 and 2/3 tablets , Disp: , Rfl:  .  verapamil (CALAN-SR) 180 MG CR tablet, take 1 tablet by mouth twice a day, Disp: 60 tablet, Rfl: 5 .  vitamin B-12 (CYANOCOBALAMIN) 1000 MCG tablet, Take 1,000 mcg by mouth daily., Disp: , Rfl:  .  vitamin C (ASCORBIC ACID) 500 MG tablet, Take 500 mg by mouth at bedtime., Disp: , Rfl:   Past Medical History: Past Medical History:  Diagnosis Date  . Allergy   . Arthritis    s/p bilateral knees and left hip replacement  . Asthma   . CAD (coronary artery disease)    a. 12/1996 s/p CABG x4 ([place]);  b. 2005  Pt reports stress test & cath, which revealed patent grafts.  . Cancer ([place])    melanoma right arm  . Carotid arterial disease ([place])    a. 04/2015 Carotid U/S: <50% bilat ICA stenosis.  . Chronic kidney disease   . Colon polyps    H/O  . Depression   . GERD (gastroesophageal reflux disease)    h/o hiatal hernia  . Headache    migraines in past  . Heart murmur    a. 04/2011 Echo: EF 55-60%, bilat atrial enlargement, mild to mod TR.  [name] History of chicken pox   . History of hiatal hernia   . Hx of migraines    rare now  . Hx: UTI (urinary tract infection)   . Hyperlipidemia    a. Statin intolerant -->on zetia.  . Hypertensive heart disease   . Lichen planus   . Melanoma ([place])   . Palpitations    a. rare PVC's and h/o SVT.  [name] PMR (polymyalgia rheumatica) (HCC)    h/o in setting of crestor usage.  [name] PSVT (paroxysmal supraventricular tachycardia) ([place])   . PUD (peptic ulcer disease)    remote history  . Raynaud's phenomenon   . Spinal stenosis   . Urine incontinence    H/O  . Vitamin D deficiency     Tobacco Use: History  Smoking Status  . Former Smoker  Smokeless Tobacco  . Never Used    Comment: quit 44+ yrs ago    Labs: Recent Review Flowsheet Data    Labs for ITP Cardiac and Pulmonary Rehab Latest Ref Rng & Units 06/15/2014 12/02/2014 03/21/2015 09/12/2015 03/26/2016   Cholestrol 0 - 200 mg/dL 244(H) 193 208(H) 204(H) 221(H)   LDLCALC 0 - 99 mg/dL 170(H) 120(H) 140(H) 137(H) 143(H)   LDLDIRECT mg/dL - - - - -   HDL >39.00 mg/dL 46.80 50.00 49.90 49.70 54.80   Trlycerides 0.0 - 149.0 mg/dL 137.0 114.0 92.0 86.0 119.0       Exercise Target Goals: Date: 06/21/16  Exercise Program Goal: Individual exercise prescription set with THRR, safety & activity barriers. Participant demonstrates ability to understand and report RPE using BORG scale, to self-measure pulse accurately, and to acknowledge the importance of the exercise prescription.  Exercise Prescription  Goal: Starting with aerobic activity 30 plus minutes a day, 3 days per week for initial exercise prescription. Provide home exercise prescription and guidelines that participant acknowledges understanding prior to discharge.  Activity Barriers & Risk Stratification:     Activity Barriers & Cardiac Risk Stratification - 06/21/16 1434      Activity Barriers & Cardiac Risk Stratification  Activity Barriers Arthritis;Joint Problems;Shortness of Breath;Back Problems;Balance Concerns;Assistive Device;Left Hip Replacement;Left Knee Replacement;Right Knee Replacement;Neck/Spine Problems;Deconditioning;Muscular Weakness;History of Falls   Cardiac Risk Stratification High      6 Minute Walk:     6 Minute Walk    Row Name 06/21/16 1435         6 Minute Walk   Phase Initial     Distance 1060 feet     Walk Time 6 minutes     # of Rest Breaks 0     MPH 2.01     METS 1.75     RPE 13     Perceived Dyspnea  2     VO2 Peak 6.12     Symptoms Yes (comment)     Comments back pain     Resting HR 75 bpm     Resting BP 132/82     Max Ex. HR 98 bpm     Max Ex. BP 136/64     2 Minute Post BP 130/66        Initial Exercise Prescription:     Initial Exercise Prescription - 06/21/16 1400      Date of Initial Exercise RX and Referring Provider   Date 06/21/16   Referring Provider Kathlyn Sacramento MD     Treadmill   MPH 1   Grade 0   Minutes 15   METs 1.77     NuStep   Level 2   Minutes 15   METs 1.8     Biostep-RELP   Level 2   Minutes 15   METs 2     Prescription Details   Frequency (times per week) 3   Duration Progress to 45 minutes of aerobic exercise without signs/symptoms of physical distress     Intensity   THRR 40-80% of Max Heartrate 102-129   Ratings of Perceived Exertion 11-15   Perceived Dyspnea 0-4     Progression   Progression Continue to progress workloads to maintain intensity without signs/symptoms of physical distress.     Resistance Training    Training Prescription Yes   Weight 2 lbs   Reps 10-12      Perform Capillary Blood Glucose checks as needed.  Exercise Prescription Changes:     Exercise Prescription Changes    Row Name 06/21/16 1400             Exercise Review   Progression -  walk test results         Response to Exercise   Blood Pressure (Admit) 132/82       Blood Pressure (Exercise) 136/64       Blood Pressure (Exit) 130/66       Heart Rate (Admit) 75 bpm       Heart Rate (Exercise) 98 bpm       Oxygen Saturation (Admit) 97 %       Rating of Perceived Exertion (Exercise) 13       Perceived Dyspnea (Exercise) 2       Symptoms back pain          Exercise Comments:   Discharge Exercise Prescription (Final Exercise Prescription Changes):     Exercise Prescription Changes - 06/21/16 1400      Exercise Review   Progression --  walk test results     Response to Exercise   Blood Pressure (Admit) 132/82   Blood Pressure (Exercise) 136/64   Blood Pressure (Exit) 130/66   Heart Rate (Admit) 75 bpm   Heart  Rate (Exercise) 98 bpm   Oxygen Saturation (Admit) 97 %   Rating of Perceived Exertion (Exercise) 13   Perceived Dyspnea (Exercise) 2   Symptoms back pain      Nutrition:  Target Goals: Understanding of nutrition guidelines, daily intake of sodium 1500mg , cholesterol 200mg , calories 30% from fat and 7% or less from saturated fats, daily to have 5 or more servings of fruits and vegetables.  Biometrics:     Pre Biometrics - 06/21/16 1443      Pre Biometrics   Height 5' 3.5" (1.613 m)   Weight 184 lb 8 oz (83.7 kg)   Waist Circumference 37.5 inches   Hip Circumference 47 inches   Waist to Hip Ratio 0.8 %   BMI (Calculated) 32.2   Single Leg Stand 1.1 seconds       Nutrition Therapy Plan and Nutrition Goals:     Nutrition Therapy & Goals - 06/21/16 1440      Nutrition Therapy   Diet --  . 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  Charlotte Wagner also doesn't feel that she needs to meet individually with the Cardiac REhab REgisterd Dietician. "I know what to eat   Drug/Food Interactions Statins/Certain Fruits     Intervention Plan   Intervention Prescribe, educate and counsel regarding individualized specific dietary modifications aiming towards targeted core components such as weight, hypertension, lipid management, diabetes, heart failure and other comorbidities.   Expected Outcomes Short Term Goal: Understand basic principles of dietary content, such as calories, fat, sodium, cholesterol and nutrients.;Long Term Goal: Adherence to prescribed nutrition plan.      Nutrition Discharge: Rate Your Plate Scores:   Nutrition Goals Re-Evaluation:   Psychosocial: Target Goals: Acknowledge presence or absence of depression, maximize coping skills, provide positive support system. Participant is able to verbalize types and ability to use techniques and skills needed for reducing stress and depression.  Initial Review & Psychosocial Screening:     Initial Psych Review & Screening - 06/21/16 1441      Initial Review   Current issues with History of Depression     Family Dynamics   Good Support System? Yes   Comments Charlotte Wagner reports that she has a family history of depression. Charlotte Wagner said she started herself on antidepressants after she asked her husband to leave and she was raising 3 children alone plus private MD practice.     Screening Interventions   Interventions Encouraged to exercise      Quality of Life Scores:     Quality of Life - 06/21/16 1442      Quality of Life Scores   Health/Function Pre 23.18 %   Socioeconomic Pre 28.93 %   Psych/Spiritual Pre 29.14 %   Family Pre 30 %   GLOBAL Pre 26.59 %      PHQ-9: Recent Review Flowsheet Data    Depression screen [facility] 2/9 06/21/2016 06/14/2016 02/23/2016 08/16/2015 12/21/2014   Decreased Interest 1 0 0 0 0   Down, Depressed, Hopeless 1 0 0 0 0   PHQ - 2 Score 2 0  0 0 0   Altered sleeping 2 - - - -   Tired, decreased energy 1 - - - -   Change in appetite 0 - - - -   Feeling bad or failure about yourself  0 - - - -   Trouble concentrating 0 - - - -   Moving slowly or fidgety/restless 0 - - - -  Suicidal thoughts 0 - - - -   PHQ-9 Score 5 - - - -   Difficult doing work/chores Somewhat difficult - - - -      Psychosocial Evaluation and Intervention:   Psychosocial Re-Evaluation:   Vocational Rehabilitation: Provide vocational rehab assistance to qualifying candidates.   Vocational Rehab Evaluation & Intervention:     Vocational Rehab - 06/21/16 1435      Initial Vocational Rehab Evaluation & Intervention   Assessment shows need for Vocational Rehabilitation No      Education: Education Goals: Education classes will be provided on a weekly basis, covering required topics. Participant will state understanding/return demonstration of topics presented.  Learning Barriers/Preferences:     Learning Barriers/Preferences - 06/21/16 1434      Learning Barriers/Preferences   Learning Barriers None;Exercise Concerns   Learning Preferences Audio;Group Instruction;Written Material      Education Topics: General Nutrition Guidelines/Fats and Fiber: -Group instruction provided by verbal, written material, models and posters to present the general guidelines for heart healthy nutrition. Gives an explanation and review of dietary fats and fiber.   Controlling Sodium/Reading Food Labels: -Group verbal and written material supporting the discussion of sodium use in heart healthy nutrition. Review and explanation with models, verbal and written materials for utilization of the food label.   Exercise Physiology & Risk Factors: - Group verbal and written instruction with models to review the exercise physiology of the cardiovascular system and associated critical values. Details cardiovascular disease risk factors and the goals associated with  each risk factor.   Aerobic Exercise & Resistance Training: - Gives group verbal and written discussion on the health impact of inactivity. On the components of aerobic and resistive training programs and the benefits of this training and how to safely progress through these programs.   Flexibility, Balance, General Exercise Guidelines: - Provides group verbal and written instruction on the benefits of flexibility and balance training programs. Provides general exercise guidelines with specific guidelines to those with heart or lung disease. Demonstration and skill practice provided.   Stress Management: - Provides group verbal and written instruction about the health risks of elevated stress, cause of high stress, and healthy ways to reduce stress.   Depression: - Provides group verbal and written instruction on the correlation between heart/lung disease and depressed mood, treatment options, and the stigmas associated with seeking treatment.   Anatomy & Physiology of the Heart: - Group verbal and written instruction and models provide basic cardiac anatomy and physiology, with the coronary electrical and arterial systems. Review of: AMI, Angina, Valve disease, Heart Failure, Cardiac Arrhythmia, Pacemakers, and the ICD.   Cardiac Procedures: - Group verbal and written instruction and models to describe the testing methods done to diagnose heart disease. Reviews the outcomes of the test results. Describes the treatment choices: Medical Management, Angioplasty, or Coronary Bypass Surgery.   Cardiac Medications: - Group verbal and written instruction to review commonly prescribed medications for heart disease. Reviews the medication, class of the drug, and side effects. Includes the steps to properly store meds and maintain the prescription regimen.   Go Sex-Intimacy & Heart Disease, Get SMART - Goal Setting: - Group verbal and written instruction through game format to discuss heart  disease and the return to sexual intimacy. Provides group verbal and written material to discuss and apply goal setting through the application of the S.M.A.R.T. Method.   Other Matters of the Heart: - Provides group verbal, written materials and models to describe Heart Failure,  Angina, Valve Disease, and Diabetes in the realm of heart disease. Includes description of the disease process and treatment options available to the cardiac patient.   Exercise & Equipment Safety: - Individual verbal instruction and demonstration of equipment use and safety with use of the equipment. Flowsheet Row Cardiac Rehab from 06/21/2016 in [facility] Cardiac and Pulmonary Rehab  Date  06/21/16  Educator  C. EnterkinRN  Instruction Review Code  1- partially meets, needs review/practice      Infection Prevention: - Provides verbal and written material to individual with discussion of infection control including proper hand washing and proper equipment cleaning during exercise session. Flowsheet Row Cardiac Rehab from 06/21/2016 in [facility] Cardiac and Pulmonary Rehab  Date  06/21/16  Educator  C. EnterkinRN  Instruction Review Code  2- meets goals/outcomes      Falls Prevention: - Provides verbal and written material to individual with discussion of falls prevention and safety. Flowsheet Row Cardiac Rehab from 06/21/2016 in [facility] Cardiac and Pulmonary Rehab  Date  06/21/16  Educator  C. [place]  Instruction Review Code  2- meets goals/outcomes      Diabetes: - Individual verbal and written instruction to review signs/symptoms of diabetes, desired ranges of glucose level fasting, after meals and with exercise. Advice that pre and post exercise glucose checks will be done for 3 sessions at entry of program.    Knowledge Questionnaire Score:     Knowledge Questionnaire Score - 06/21/16 1435      Knowledge Questionnaire Score   Pre Score 26      Core Components/Risk Factors/Patient Goals at  Admission:     Personal Goals and Risk Factors at Admission - 06/21/16 1440      Core Components/Risk Factors/Patient Goals on Admission   Increase Strength and Stamina Yes   Intervention Provide advice, education, support and counseling about physical activity/exercise needs.;Develop an individualized exercise prescription for aerobic and resistive training based on initial evaluation findings, risk stratification, comorbidities and participant's personal goals.   Expected Outcomes Achievement of increased cardiorespiratory fitness and enhanced flexibility, muscular endurance and strength shown through measurements of functional capacity and personal statement of participant.   Hypertension Yes   Intervention Provide education on lifestyle modifcations including regular physical activity/exercise, weight management, moderate sodium restriction and increased consumption of fresh fruit, vegetables, and low fat dairy, alcohol moderation, and smoking cessation.;Monitor prescription use compliance.   Expected Outcomes Short Term: Continued assessment and intervention until BP is < 140/70mm HG in hypertensive participants. < 130/97mm HG in hypertensive participants with diabetes, heart failure or chronic kidney disease.;Long Term: Maintenance of blood pressure at goal levels.   Lipids Yes   Intervention Provide education and support for participant on nutrition & aerobic/resistive exercise along with prescribed medications to achieve LDL 70mg , HDL >40mg .   Expected Outcomes Short Term: Participant states understanding of desired cholesterol values and is compliant with medications prescribed. Participant is following exercise prescription and nutrition guidelines.;Long Term: Cholesterol controlled with medications as prescribed, with individualized exercise RX and with personalized nutrition plan. Value goals: LDL < 70mg , HDL > 40 mg.      Core Components/Risk Factors/Patient Goals Review:    Core  Components/Risk Factors/Patient Goals at Discharge (Final Review):    ITP Comments:     ITP Comments    Row Name 06/21/16 1438 06/21/16 1442         ITP Comments Charlotte Wagner walks with a cane. She uses a rolling walker at homes at times. Charlotte Wagner reports history that  she tripped over something and fell and bruised her leg. 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 Charlotte Wagner also doesn't feel that she needs to meet individually with the Cardiac REhab REgisterd Dietician. "I know what to eat" Charlotte Wagner reports that she has a family history of depression. Charlotte Wagner said she started herself on antidepressants after she asked her husband to leave and she was raising 3 children alone plus private MD practice.         Comments:

## 2016-06-21 NOTE — Progress Notes (Signed)
Daily Session Note  Patient Details  Name: Charlotte Wagner MRN: [id number] Date of Birth: 1935-10-20 Referring Provider:   Flowsheet Row Cardiac Rehab from 06/21/2016 in [facility] Cardiac and Pulmonary Rehab  Referring Provider  Kathlyn Sacramento MD      Encounter Date: 06/21/2016  Check In:     Session Check In - 06/21/16 1435      Check-In   Location [facility]-Cardiac & Pulmonary Rehab   Staff Present Gerlene Burdock, RN, Levie Heritage, MA, ACSM RCEP, Exercise Physiologist   Supervising physician immediately available to respond to emergencies See telemetry face sheet for immediately available ER MD   Medication changes reported     No   Fall or balance concerns reported    Yes  Jacquel walks with a cane. She uses a rolling walker at homes at times.    Warm-up and Cool-down Not performed (comment)  6 minute walk done today in the hallway.   Resistance Training Performed No   VAD Patient? No     Pain Assessment   Currently in Pain? Yes   Pain Score 3    Pain Location Back  "back plus posterior thigh pain 3/10 history of this for years   Pain Descriptors / Indicators Aching   Pain Onset More than a month ago   Pain Frequency Constant           Exercise Prescription Changes - 06/21/16 1400      Exercise Review   Progression --  walk test results     Response to Exercise   Blood Pressure (Admit) 132/82   Blood Pressure (Exercise) 136/64   Blood Pressure (Exit) 130/66   Heart Rate (Admit) 75 bpm   Heart Rate (Exercise) 98 bpm   Oxygen Saturation (Admit) 97 %   Rating of Perceived Exertion (Exercise) 13   Perceived Dyspnea (Exercise) 2   Symptoms back pain      Goals Met:  Personal goals reviewed  Goals Unmet:  Not Applicable  Comments:     Dr. Emily Filbert is Medical Director for [place] and [facility].

## 2016-06-25 NOTE — Telephone Encounter (Signed)
close

## 2016-06-28 ENCOUNTER — Encounter: Payer: Medicare Other | Admitting: *Deleted

## 2016-06-28 DIAGNOSIS — Z955 Presence of coronary angioplasty implant and graft: Secondary | ICD-10-CM | POA: Diagnosis not present

## 2016-06-28 DIAGNOSIS — Z9861 Coronary angioplasty status: Secondary | ICD-10-CM

## 2016-06-28 NOTE — Progress Notes (Signed)
Daily Session Note  Patient Details  Name: Charlotte Wagner MRN: [id number] Date of Birth: 15-Feb-1936 Referring Provider:   Flowsheet Row Cardiac Rehab from 06/21/2016 in [facility] Cardiac and Pulmonary Rehab  Referring Provider  Kathlyn Sacramento MD      Encounter Date: 06/28/2016  Check In:     Session Check In - 06/28/16 1812      Check-In   Staff Present Heath Lark, RN, BSN, CCRP;Sherrilynn Sommer, BA, ACSM CEP, Exercise Physiologist  First full day of exercise!  Patient was oriented to gym and equipment including functions, settings, policies, and procedures.  Patient's individual exercise prescription and treatment plan were reviewed.  All starting workloads were established based o   Supervising physician immediately available to respond to emergencies See telemetry face sheet for immediately available ER MD   Medication changes reported     No   Fall or balance concerns reported    No   Warm-up and Cool-down Performed on first and last piece of equipment   Resistance Training Performed Yes   VAD Patient? No     Pain Assessment   Currently in Pain? No/denies         Goals Met:  Exercise tolerated well Personal goals reviewed No report of cardiac concerns or symptoms Strength training completed today  Goals Unmet:  Not Applicable  Comments: First full session,tolerated exercise,setting her pace today.   Dr. Emily Filbert is Medical Director for [place] and [facility].

## 2016-07-02 ENCOUNTER — Encounter: Payer: Medicare Other | Admitting: *Deleted

## 2016-07-02 DIAGNOSIS — Z9861 Coronary angioplasty status: Secondary | ICD-10-CM

## 2016-07-02 DIAGNOSIS — Z955 Presence of coronary angioplasty implant and graft: Secondary | ICD-10-CM | POA: Diagnosis not present

## 2016-07-02 NOTE — Progress Notes (Signed)
Daily Session Note  Patient Details  Name: SHENICE DOLDER MRN: [id number] Date of Birth: [birth year] Referring Provider:   Flowsheet Row Cardiac Rehab from 06/21/2016 in [facility] Cardiac and Pulmonary Rehab  Referring Provider  Kathlyn Sacramento MD      Encounter Date: 07/02/2016  Check In:     Session Check In - 07/02/16 1650      Check-In   Staff Present Heath Lark, RN, BSN, CCRP;Carroll Enterkin, RN, Moises Blood, BS, ACSM CEP, Exercise Physiologist   Supervising physician immediately available to respond to emergencies See telemetry face sheet for immediately available ER MD   Medication changes reported     No   Fall or balance concerns reported    No   Warm-up and Cool-down Performed on first and last piece of equipment   Resistance Training Performed Yes   VAD Patient? No     Pain Assessment   Currently in Pain? No/denies         Goals Met:  Exercise tolerated well No report of cardiac concerns or symptoms Strength training completed today  Goals Unmet:  Not Applicable  Comments: Doing well with exercise prescription progression.    Dr. Emily Filbert is Medical Director for [place] and [facility].

## 2016-07-05 DIAGNOSIS — Z955 Presence of coronary angioplasty implant and graft: Secondary | ICD-10-CM | POA: Diagnosis not present

## 2016-07-05 DIAGNOSIS — Z9861 Coronary angioplasty status: Secondary | ICD-10-CM

## 2016-07-05 NOTE — Progress Notes (Signed)
Daily Session Note  Patient Details  Name: Charlotte Wagner MRN: [id number] Date of Birth: [birth year] Referring Provider:   Flowsheet Row Cardiac Re[facility]  Referring Provider  Kathlyn Sacramento MD      Encounter Date: 07/05/2016  Check In:     Session Check In - 07/05/16 1733      Check-In   Location [facility]-Cardiac & Pulmonary Rehab   Staff Present Heath Lark, RN, BSN, CCRP;Laureen Owens Shark, BS, RRT, Respiratory Dareen Piano, BA, ACSM CEP, Exercise Physiologist   Supervising physician immediately available to respond to emergencies See telemetry face sheet for immediately available ER MD   Medication changes reported     No   Fall or balance concerns reported    No   Warm-up and Cool-down Performed on first and last piece of equipment   Resistance Training Performed Yes   VAD Patient? No     Pain Assessment   Currently in Pain? No/denies         Goals Met:  Independence with exercise equipment Exercise tolerated well No report of cardiac concerns or symptoms Strength training completed today  Goals Unmet:  Not Applicable  Comments: Pt able to follow exercise prescription today without complaint.  Will continue to monitor for progression.    Dr. Emily Filbert is Medical Director for [place] and [facility].

## 2016-07-09 ENCOUNTER — Encounter: Payer: Medicare Other | Attending: Cardiovascular Disease

## 2016-07-09 DIAGNOSIS — R0602 Shortness of breath: Secondary | ICD-10-CM | POA: Diagnosis not present

## 2016-07-09 DIAGNOSIS — Z955 Presence of coronary angioplasty implant and graft: Secondary | ICD-10-CM | POA: Diagnosis not present

## 2016-07-09 DIAGNOSIS — Z9861 Coronary angioplasty status: Secondary | ICD-10-CM

## 2016-07-09 NOTE — Progress Notes (Signed)
Daily Session Note  Patient Details  Name: BRANDICE BUSSER MRN: [id number] Date of Birth: [birth year] Referring Provider:   Flowsheet Row Cardiac Rehab from 06/21/2016 in [facility] Cardiac and Pulmonary Rehab  Referring Provider  Kathlyn Sacramento MD      Encounter Date: 07/09/2016  Check In:     Session Check In - 07/09/16 1714      Check-In   Location [facility]-Cardiac & Pulmonary Rehab   Staff Present Heath Lark, RN, BSN, Laveda Norman, BS, ACSM CEP, Exercise Physiologist;Ruchama Oletta Darter, [place], ACSM CEP, Exercise Physiologist   Supervising physician immediately available to respond to emergencies See telemetry face sheet for immediately available ER MD   Medication changes reported     No   Fall or balance concerns reported    No   Warm-up and Cool-down Performed on first and last piece of equipment   Resistance Training Performed Yes   VAD Patient? No     Pain Assessment   Currently in Pain? No/denies         Goals Met:  Independence with exercise equipment Exercise tolerated well No report of cardiac concerns or symptoms Strength training completed today  Goals Unmet:  Not Applicable  Comments: Pt able to follow exercise prescription today without complaint.  Will continue to monitor for progression.    Dr. Emily Filbert is Medical Director for [place] and [facility].

## 2016-07-11 ENCOUNTER — Encounter: Payer: Self-pay | Admitting: Cardiology

## 2016-07-11 ENCOUNTER — Ambulatory Visit (INDEPENDENT_AMBULATORY_CARE_PROVIDER_SITE_OTHER): Payer: Medicare Other | Admitting: Cardiology

## 2016-07-11 VITALS — BP 90/60 | HR 60 | Ht 63.5 in | Wt 183.4 lb

## 2016-07-11 DIAGNOSIS — Z951 Presence of aortocoronary bypass graft: Secondary | ICD-10-CM

## 2016-07-11 DIAGNOSIS — E7849 Other hyperlipidemia: Secondary | ICD-10-CM

## 2016-07-11 DIAGNOSIS — I2510 Atherosclerotic heart disease of native coronary artery without angina pectoris: Secondary | ICD-10-CM

## 2016-07-11 DIAGNOSIS — I200 Unstable angina: Secondary | ICD-10-CM | POA: Diagnosis not present

## 2016-07-11 DIAGNOSIS — R0602 Shortness of breath: Secondary | ICD-10-CM | POA: Diagnosis not present

## 2016-07-11 DIAGNOSIS — E784 Other hyperlipidemia: Secondary | ICD-10-CM

## 2016-07-11 DIAGNOSIS — I10 Essential (primary) hypertension: Secondary | ICD-10-CM

## 2016-07-11 DIAGNOSIS — Z9861 Coronary angioplasty status: Secondary | ICD-10-CM | POA: Diagnosis not present

## 2016-07-11 NOTE — Progress Notes (Signed)
Cardiology Office Note   Date:  07/11/2016   ID:  Charlotte Wagner, DOB 07-17-1936, MRN [id number]  Referring Doctor:  [name], MD   Cardiologist:   Wende Bushy, MD   Reason for consultation:  Chief Complaint  Patient presents with  . Follow-up    SOME SOB WITH EXERTION. NO CP OR SWELLING. NO OTHER COMPLAINTS.   hosp ffup after PCI   History of Present Illness: Charlotte Wagner is a 80 y.o. female who presents for Follow-up a  No chest pains. She still has the shortness of breath that developed after initiating Brilinta. Blood pressure remains on the lower side however she does not have any dizziness or lightheadedness. She notices some orthostatic change with the addition but otherwise she is unable to tolerate these changes without any problems. She is not inclined to change any of her medications at this point since she feels she is otherwise stable. Next  She's participating in cardiac rehabilitation and tolerating it well. Patient denies PND, orthopnea, edema. No palpitations.  ROS:  Please see the history of present illness. Aside from mentioned under HPI, all other systems are reviewed and negative.     Past Medical History:  Diagnosis Date  . Allergy   . Arthritis    s/p bilateral knees and left hip replacement  . Asthma   . CAD (coronary artery disease)    a. 12/1996 s/p CABG x4 ([place]);  b. 2005 Pt reports stress test & cath, which revealed patent grafts.  . Cancer ([place])    melanoma right arm  . Carotid arterial disease ([place])    a. 04/2015 Carotid U/S: <50% bilat ICA stenosis.  . Chronic kidney disease   . Colon polyps    H/O  . Depression   . GERD (gastroesophageal reflux disease)    h/o hiatal hernia  . Headache    migraines in past  . Heart murmur    a. 04/2011 Echo: EF 55-60%, bilat atrial enlargement, mild to mod TR.  [name] History of chicken pox   . History of hiatal hernia   . Hx of migraines    rare now  . Hx: UTI (urinary tract  infection)   . Hyperlipidemia    a. Statin intolerant -->on zetia.  . Hypertensive heart disease   . Lichen planus   . Melanoma ([place])   . Palpitations    a. rare PVC's and h/o SVT.  [name] PMR (polymyalgia rheumatica) (HCC)    h/o in setting of crestor usage.  [name] PSVT (paroxysmal supraventricular tachycardia) ([place])   . PUD (peptic ulcer disease)    remote history  . Raynaud's phenomenon   . Spinal stenosis   . Urine incontinence    H/O  . Vitamin D deficiency     Past Surgical History:  Procedure Laterality Date  . ADENOIDECTOMY     age 28  . BACK SURGERY     L3-L5  . BILATERAL CARPAL TUNNEL RELEASE    . BREAST BIOPSY Right    bx x 3 neg  . BREAST SURGERY Right    biopsy x 3 (all benign)  . CARDIAC CATHETERIZATION N/A 06/01/2016   Procedure: LEFT HEART CATH AND CORS/GRAFTS ANGIOGRAPHY;  Surgeon: Wellington Hampshire, MD;  Location: [place] CV LAB;  Service: Cardiovascular;  Laterality: N/A;  . CARDIAC CATHETERIZATION N/A 06/01/2016   Procedure: Coronary Stent Intervention;  Surgeon: Wellington Hampshire, MD;  Location: [place] CV LAB;  Service: Cardiovascular;  Laterality:  N/A;  . CATARACT EXTRACTION W/PHACO Right 01/04/2016   Procedure: CATARACT EXTRACTION PHACO AND INTRAOCULAR LENS PLACEMENT (IOC);  Surgeon: Leandrew Koyanagi, MD;  Location: [place];  Service: Ophthalmology;  Laterality: Right;  MALYUGIN  . CATARACT EXTRACTION W/PHACO Left 01/25/2016   Procedure: CATARACT EXTRACTION PHACO AND INTRAOCULAR LENS PLACEMENT ([place]) left eye;  Surgeon: Leandrew Koyanagi, MD;  Location: [place];  Service: Ophthalmology;  Laterality: Left;  MALYUGIN SHUGARCAINE  . CHOLECYSTECTOMY  90's  . COLONOSCOPY WITH PROPOFOL N/A 05/03/2015   Procedure: COLONOSCOPY WITH PROPOFOL;  Surgeon: Lollie Sails, MD;  Location: [facility] ENDOSCOPY;  Service: Endoscopy;  Laterality: N/A;  . CORONARY ARTERY BYPASS GRAFT  98   4 vessel  . ESOPHAGOGASTRODUODENOSCOPY N/A 03/22/2015    Procedure: ESOPHAGOGASTRODUODENOSCOPY (EGD);  Surgeon: Lollie Sails, MD;  Location: [facility] ENDOSCOPY;  Service: Endoscopy;  Laterality: N/A;  . JOINT REPLACEMENT     BILATERAL KNEE REPLACEMENTS  . KNEE ARTHROSCOPY W/ OATS PROCEDURE     Lt knee (9/01), Rt knee (3/11), Lt hip (5/10)  . TOTAL HIP ARTHROPLASTY       reports that she has quit smoking. She has never used smokeless tobacco. She reports that she does not drink alcohol or use drugs.   family history includes Alcohol abuse in her father; Depression in her daughter, father, maternal grandmother, and son; Heart disease in her father; Hypertension in her maternal grandfather and paternal grandfather; Lung cancer in her sister; Stroke in her maternal grandmother; Thyroid disease in her daughter and mother.   Outpatient Medications Prior to Visit  Medication Sig Dispense Refill  . acetaminophen (TYLENOL) 500 MG tablet Take 1,000 mg by mouth 2 (two) times daily.     [name] amitriptyline (ELAVIL) 25 MG tablet Take 25 mg by mouth at bedtime.    [name] b complex vitamins tablet Take 1 tablet by mouth daily.    . bisoprolol (ZEBETA) 5 MG tablet Take 2.5 mg by mouth at bedtime.    . Cholecalciferol (VITAMIN D3) 2000 UNITS TABS Take 1 capsule by mouth daily.     . Coenzyme Q10 (COQ-10) 100 MG CAPS Take 1 tablet by mouth daily.    [name] ezetimibe (ZETIA) 10 MG tablet Take 10 mg by mouth at bedtime.    . folic acid (FOLVITE) 1 MG tablet take 1 tablet by mouth once daily 30 tablet 5  . hydrocortisone 2.5 % cream apply to VULVA twice a day AT 10AM AND 4 PM 28 g 0  . ketoconazole (NIZORAL) 2 % cream Apply 1 application topically daily. (Patient taking differently: Apply 1 application topically daily as needed for irritation. ) 30 g 0  . loratadine (CLARITIN) 10 MG tablet Take 10 mg by mouth daily.    . meclizine (ANTIVERT) 25 MG tablet Take 25 mg by mouth 3 (three) times daily as needed for dizziness.    . miconazole (MICOTIN) 2 % cream Apply 1 application  topically 2 (two) times daily as needed (irritation).    . Multiple Vitamins-Minerals (HAIR/SKIN/NAILS PO) Take 1 tablet by mouth daily.    . nitroGLYCERIN (NITROSTAT) 0.4 MG SL tablet Place 1 tablet (0.4 mg total) under the tongue every 5 (five) minutes as needed for chest pain. Place 1 tablet (0.4 mg total) under the tongue every 5 (five) minutes , 3 times as needed for chest pain. 15 tablet 0  . Omega-3 Fatty Acids (FISH OIL PO) Take 2,000 mg by mouth daily.    . pantoprazole (PROTONIX) 40 MG tablet  Take 1 tablet (40 mg total) by mouth 2 (two) times daily. 60 tablet 5  . Polyethylene Glycol 3350 (MIRALAX PO) Take 8.5 g by mouth at bedtime.     . ramipril (ALTACE) 10 MG capsule Take 10 mg by mouth at bedtime.    [name] spironolactone (ALDACTONE) 25 MG tablet take 1 tablet by mouth every morning 30 tablet 5  . sucralfate (CARAFATE) 1 g tablet Take 1 g by mouth 2 (two) times daily as needed (acid reflux).    . ticagrelor (BRILINTA) 90 MG TABS tablet Take 1 tablet (90 mg total) by mouth 2 (two) times daily. 60 tablet 2  . traMADol (ULTRAM) 50 MG tablet Take 1 tablet (50 mg total) by mouth daily as needed. (Patient taking differently: Take 50 mg by mouth daily as needed for moderate pain. ) 20 tablet 0  . traZODone (DESYREL) 150 MG tablet Take 275 mg by mouth at bedtime. Patient takes 1 and 2/3 tablets     . verapamil (CALAN-SR) 180 MG CR tablet take 1 tablet by mouth twice a day 60 tablet 5  . vitamin B-12 (CYANOCOBALAMIN) 1000 MCG tablet Take 1,000 mcg by mouth daily.    . vitamin C (ASCORBIC ACID) 500 MG tablet Take 500 mg by mouth at bedtime.     No facility-administered medications prior to visit.      Allergies: Beta adrenergic blockers; Albuterol; Aspirin; Penicillins; Statins; Sulfa antibiotics; Tetracycline; and Tetracyclines & related    PHYSICAL EXAM: VS:  BP 90/60 (BP Location: Left Arm, Patient Position: Sitting, Cuff Size: Normal)   Pulse 60   Ht 5' 3.5" (1.613 m)   Wt 183 lb 6.4 oz  (83.2 kg)   BMI 31.98 kg/m  , Body mass index is 31.98 kg/m. Wt Readings from Last 3 Encounters:  07/11/16 183 lb 6.4 oz (83.2 kg)  06/21/16 184 lb 8 oz (83.7 kg)  06/14/16 185 lb 9.6 oz (84.2 kg)    GENERAL:  well developed, well nourished, obese, not in acute distress HEENT: normocephalic, pink conjunctivae, anicteric sclerae, no xanthelasma, normal dentition, oropharynx clear NECK:  no neck vein engorgement, JVP normal, no hepatojugular reflux, carotid upstroke brisk and symmetric, no bruit, no thyromegaly, no lymphadenopathy LUNGS:  good respiratory effort, clear to auscultation bilaterally CV:  PMI not displaced, no thrills, no lifts, S1 and S2 within normal limits, no palpable S3 or S4, no murmurs, no rubs, no gallops ABD:  Soft, nontender, nondistended, normoactive bowel sounds, no abdominal aortic bruit, no hepatomegaly, no splenomegaly, bruise noted on the left lower quadrant MS: nontender back, no kyphosis, no scoliosis, no joint deformities EXT:  2+ DP/PT pulses, no edema, no varicosities, no cyanosis, no clubbing. RFA is intact, no bruit. Extensive bruising but no hematoma on the right femoral artery access site --this has since resolved compared to last visit's PE SKIN: warm, nondiaphoretic, normal turgor, no ulcers NEUROPSYCH: alert, oriented to person, place, and time, sensory/motor grossly intact, normal mood, appropriate affect  Recent Labs: 09/12/2015: TSH 1.54 03/26/2016: ALT 18 06/12/2016: BUN 24; Creatinine, Ser 0.95; Hemoglobin 12.5; Platelets 240; Potassium 4.3; Sodium 134   Lipid Panel    Component Value Date/Time   CHOL 221 (H) 03/26/2016 0932   TRIG 119.0 03/26/2016 0932   HDL 54.80 03/26/2016 0932   CHOLHDL 4 03/26/2016 0932   VLDL 23.8 03/26/2016 0932   LDLCALC 143 (H) 03/26/2016 0932   LDLDIRECT 126.7 07/03/2013 0937     Other studies Reviewed:  EKG:  The ekg from  06/12/2016 was personally reviewed by me and it revealed sinus rhythm, 67 BPM.  Low-voltage QRS.  Additional studies/ records that were reviewed personally reviewed by me today include:  Left heart cath 06/01/2016:  Prox LAD lesion, 100 %stenosed.  Ost Cx to Prox Cx lesion, 50 %stenosed.  Ost LM to LM lesion, 70 %stenosed.  Mid RCA lesion, 100 %stenosed.  Prox RCA lesion, 60 %stenosed.  SVG graft was visualized by angiography and is normal in caliber.  Post Atrio lesion, 60 %stenosed.  SVG graft was visualized by angiography and is normal in caliber.  The graft exhibits mild .  The flow in the graft is reversed.  Origin lesion, 30 %stenosed.  SVG graft was visualized by angiography.  Prox Graft lesion, 30 %stenosed.  LIMA graft was visualized by angiography and is normal in caliber and anatomically normal.  The left ventricular systolic function is normal.  LV end diastolic pressure is mildly elevated.  The left ventricular ejection fraction is 55-65% by visual estimate.  A STENT XIENCE ALPINE RX 3.0X15 drug eluting stent was successfully placed, and does not overlap previously placed stent.  Prox Graft lesion, 90 %stenosed.  Post intervention, there is a 0% residual stenosis.   1. Severe underlying three-vessel coronary artery disease with patent grafts including LIMA to LAD, SVG to diagonal, SVG to left circumflex and SVG to RCA. Severe 90% stenosis and SVG to RCA is likely the culprit for unstable angina.  2. Normal LV systolic function and mildly elevated left ventricular end-diastolic pressure.  3. Successful drug-eluting stent placement to the SVG to RCA using a distal protection device.  Recommendations: The patient is severely intolerant to aspirin and she reports inability to take the medication. Thus, I elected to treat her with Brilinta monotherapy. Continue aggressive treatment for coronary artery disease.  Carotid u/s 04/25/2015, ordered by PCP: Less than 50% stenosis bilateral ICA Severe left ECA disease  Echo  06/19/2016: Left ventricle: The cavity size was normal. Systolic function was   normal. The estimated ejection fraction was in the range of 60%   to 65%. Wall motion was normal; there were no regional wall   motion abnormalities. Left ventricular diastolic function   parameters were normal. - Mitral valve: There was mild regurgitation. - Left atrium: The atrium was at the upper limits of normal in   size. - Right ventricle: Systolic function was normal. - Pulmonary arteries: Systolic pressure was mildly elevated. PA   peak pressure: 40 mm Hg (S).  ASSESSMENT AND PLAN: CAD CAD s/p CABG x 4 in 1998 in [place], [place].  Last cardiac eval took place in 2005 with stress testing and cath, apparently revealing 4/4 patent grafts. S/P LHCath and DES to SVG to RCA 06/01/2016. Other grafts patent Cont ?blocker - she is very sensitive to this. She had tried bid dosing and developed 1st degree avb. She is tolerating current dose of Zebeta 2.5mg  qd. She did not tolerate Tenormin and metoprolol in the past - lowered blood pressure too much. Cont acei.  Resume home dose of zetia (statin intolerant).  She is allergic to ASA  - significant GI side effects. Developed significant bruising to Plavix.  We discussed importance of staying on [place] for 1 year ideally at least to be transitioned to lifelong antiplatelet (will be rediscussed in future visits). And tinea cardiac rehabilitation.  Her symptom of dyspnea may be still related to the [place]. She has a history of significant side effects to different medications.  Hypertension  Tinea monitor blood pressure. If patient notices that her blood pressure remains or continues to drop further, she should call our office. At that point, will likely reduce ACE inhibitor.  Hyperlipidemia Statin intolerant. Resume zetia. Patient developed significant muscle weakness and intolerance to rosuvastatin, atorvastatin, simvastatin, and  pravastatin.  Palpitations/PSVT/PVC's This has remained stable.Cont ?blocker and CCB.  carotid artery disease  Pt followed by PCP.  Current medicines are reviewed at length with the patient today.  The patient does not have concerns regarding medicines.  Labs/ tests ordered today include:  No orders of the defined types were placed in this encounter.   I had a lengthy and detailed discussion with the patient regarding diagnoses, prognosis, diagnostic options, treatment options , and side effects of medications.   I counseled the patient on importance of lifestyle modification including heart healthy diet, regular physical activity .   Disposition:   FU with undersigned In 3 months  Signed, Wende Bushy, MD  07/11/2016 2:14 PM    [place]  This note was generated in part with voice recognition software and I apologize for any typographical errors that were not detected and corrected.

## 2016-07-11 NOTE — Patient Instructions (Signed)
Follow-Up: Your physician recommends that you schedule a follow-up appointment in: 3 months with Dr. Yvone Neu.  It was a pleasure seeing you today here in the office. Please do not hesitate to give [place] a call back if you have any further questions. [place], BSN

## 2016-07-12 ENCOUNTER — Encounter: Payer: Medicare Other | Admitting: *Deleted

## 2016-07-12 DIAGNOSIS — R0602 Shortness of breath: Secondary | ICD-10-CM | POA: Diagnosis not present

## 2016-07-12 DIAGNOSIS — Z9861 Coronary angioplasty status: Secondary | ICD-10-CM

## 2016-07-12 DIAGNOSIS — Z955 Presence of coronary angioplasty implant and graft: Secondary | ICD-10-CM | POA: Diagnosis not present

## 2016-07-12 NOTE — Progress Notes (Signed)
Daily Session Note  Patient Details  Name: Charlotte Wagner MRN: [id number] Date of Birth: April 04, 1936 Referring Provider:   Flowsheet Row Cardiac Rehab from 06/21/2016 in [facility] Cardiac and Pulmonary Rehab  Referring Provider  Kathlyn Sacramento MD      Encounter Date: 07/12/2016  Check In:     Session Check In - 07/12/16 1721      Check-In   Location [facility]-Cardiac & Pulmonary Rehab   Staff Present Alberteen Sam, MA, ACSM RCEP, Exercise Physiologist;Taylynn Oletta Darter, BA, ACSM CEP, Exercise Physiologist;Carroll Enterkin, RN, BSN   Supervising physician immediately available to respond to emergencies See telemetry face sheet for immediately available ER MD   Medication changes reported     No   Fall or balance concerns reported    No   Warm-up and Cool-down Performed on first and last piece of equipment   Resistance Training Performed Yes   VAD Patient? No     Pain Assessment   Currently in Pain? No/denies   Multiple Pain Sites No         Goals Met:  Independence with exercise equipment Exercise tolerated well Personal goals reviewed No report of cardiac concerns or symptoms Strength training completed today  Goals Unmet:  Not Applicable  Comments: Pt able to follow exercise prescription today without complaint.  Will continue to monitor for progression. Reviewed home exercise with pt today.  Pt plans to walk at home and go to class/gym at [facility] for exercise.  Reviewed THR, pulse, RPE, sign and symptoms, NTG use, and when to call 911 or MD.  Also discussed weather considerations and indoor options.  Pt voiced understanding.    Dr. Emily Filbert is Medical Director for [place] and [facility].

## 2016-07-15 ENCOUNTER — Other Ambulatory Visit: Payer: Self-pay | Admitting: Internal Medicine

## 2016-07-16 ENCOUNTER — Encounter: Payer: Medicare Other | Admitting: *Deleted

## 2016-07-16 DIAGNOSIS — Z9861 Coronary angioplasty status: Secondary | ICD-10-CM

## 2016-07-16 DIAGNOSIS — Z955 Presence of coronary angioplasty implant and graft: Secondary | ICD-10-CM | POA: Diagnosis not present

## 2016-07-16 DIAGNOSIS — R0602 Shortness of breath: Secondary | ICD-10-CM | POA: Diagnosis not present

## 2016-07-16 NOTE — Progress Notes (Signed)
Daily Session Note  Patient Details  Name: Charlotte Wagner MRN: [id number] Date of Birth: [birth year] Referring Provider:   Flowsheet Row Cardiac Rehab from 06/21/2016 in [facility] Cardiac and Pulmonary Rehab  Referring Provider  Kathlyn Sacramento MD      Encounter Date: 07/16/2016  Check In:     Session Check In - 07/16/16 1833      Check-In   Staff Present Heath Lark, RN, BSN, CCRP;Carroll Enterkin, RN, Moises Blood, BS, ACSM CEP, Exercise Physiologist   Supervising physician immediately available to respond to emergencies See telemetry face sheet for immediately available ER MD   Medication changes reported     No   Fall or balance concerns reported    No   Resistance Training Performed Yes   VAD Patient? No     Pain Assessment   Currently in Pain? No/denies         Goals Met:  Exercise tolerated well No report of cardiac concerns or symptoms Strength training completed today  Goals Unmet:  Not Applicable  Comments: Doing well with exercise prescription progression.    Dr. Emily Filbert is Medical Director for [place] and [facility].

## 2016-07-18 ENCOUNTER — Encounter: Payer: Self-pay | Admitting: *Deleted

## 2016-07-18 DIAGNOSIS — Z9861 Coronary angioplasty status: Secondary | ICD-10-CM

## 2016-07-18 NOTE — Progress Notes (Signed)
Cardiac Individual Treatment Plan  Patient Details  Name: Charlotte Wagner MRN: [id number] Date of Birth: January 12, 1936 Referring Provider:   Flowsheet Row Cardiac Rehab from 06/21/2016 in [facility] Cardiac and Pulmonary Rehab  Referring Provider  Kathlyn Sacramento MD      Initial Encounter Date:  Flowsheet Row Cardiac Rehab from 06/21/2016 in [facility] Cardiac and Pulmonary Rehab  Date  06/21/16  Referring Provider  Kathlyn Sacramento MD      Visit Diagnosis: S/P PTCA (percutaneous transluminal coronary angioplasty)  Patient's Home Medications on Admission:  Current Outpatient Prescriptions:  .  acetaminophen (TYLENOL) 500 MG tablet, Take 1,000 mg by mouth 2 (two) times daily. , Disp: , Rfl:  .  amitriptyline (ELAVIL) 25 MG tablet, Take 25 mg by mouth at bedtime., Disp: , Rfl:  .  b complex vitamins tablet, Take 1 tablet by mouth daily., Disp: , Rfl:  .  bisoprolol (ZEBETA) 5 MG tablet, Take 2.5 mg by mouth at bedtime., Disp: , Rfl:  .  Cholecalciferol (VITAMIN D3) 2000 UNITS TABS, Take 1 capsule by mouth daily. , Disp: , Rfl:  .  Coenzyme Q10 (COQ-10) 100 MG CAPS, Take 1 tablet by mouth daily., Disp: , Rfl:  .  ezetimibe (ZETIA) 10 MG tablet, Take 10 mg by mouth at bedtime., Disp: , Rfl:  .  folic acid (FOLVITE) 1 MG tablet, take 1 tablet by mouth once daily, Disp: 30 tablet, Rfl: 5 .  hydrocortisone 2.5 % cream, apply to VULVA twice a day AT 10AM AND 4 PM, Disp: 28 g, Rfl: 0 .  ketoconazole (NIZORAL) 2 % cream, Apply 1 application topically daily. (Patient taking differently: Apply 1 application topically daily as needed for irritation. ), Disp: 30 g, Rfl: 0 .  loratadine (CLARITIN) 10 MG tablet, Take 10 mg by mouth daily., Disp: , Rfl:  .  meclizine (ANTIVERT) 25 MG tablet, Take 25 mg by mouth 3 (three) times daily as needed for dizziness., Disp: , Rfl:  .  miconazole (MICOTIN) 2 % cream, Apply 1 application topically 2 (two) times daily as needed (irritation)., Disp: , Rfl:  .  Multiple  Vitamins-Minerals (HAIR/SKIN/NAILS PO), Take 1 tablet by mouth daily., Disp: , Rfl:  .  nitroGLYCERIN (NITROSTAT) 0.4 MG SL tablet, Place 1 tablet (0.4 mg total) under the tongue every 5 (five) minutes as needed for chest pain. Place 1 tablet (0.4 mg total) under the tongue every 5 (five) minutes , 3 times as needed for chest pain., Disp: 15 tablet, Rfl: 0 .  Omega-3 Fatty Acids (FISH OIL PO), Take 2,000 mg by mouth daily., Disp: , Rfl:  .  pantoprazole (PROTONIX) 40 MG tablet, Take 1 tablet (40 mg total) by mouth 2 (two) times daily., Disp: 60 tablet, Rfl: 5 .  Polyethylene Glycol 3350 (MIRALAX PO), Take 8.5 g by mouth at bedtime. , Disp: , Rfl:  .  ramipril (ALTACE) 10 MG capsule, Take 10 mg by mouth at bedtime., Disp: , Rfl:  .  ramipril (ALTACE) 10 MG capsule, take 1 capsule by mouth once daily, Disp: 30 capsule, Rfl: 3 .  spironolactone (ALDACTONE) 25 MG tablet, take 1 tablet by mouth every morning, Disp: 30 tablet, Rfl: 5 .  sucralfate (CARAFATE) 1 g tablet, Take 1 g by mouth 2 (two) times daily as needed (acid reflux)., Disp: , Rfl:  .  ticagrelor (BRILINTA) 90 MG TABS tablet, Take 1 tablet (90 mg total) by mouth 2 (two) times daily., Disp: 60 tablet, Rfl: 2 .  traMADol (ULTRAM) 50  MG tablet, Take 1 tablet (50 mg total) by mouth daily as needed. (Patient taking differently: Take 50 mg by mouth daily as needed for moderate pain. ), Disp: 20 tablet, Rfl: 0 .  traZODone (DESYREL) 150 MG tablet, Take 275 mg by mouth at bedtime. Patient takes 1 and 2/3 tablets , Disp: , Rfl:  .  verapamil (CALAN-SR) 180 MG CR tablet, take 1 tablet by mouth twice a day, Disp: 60 tablet, Rfl: 5 .  vitamin B-12 (CYANOCOBALAMIN) 1000 MCG tablet, Take 1,000 mcg by mouth daily., Disp: , Rfl:  .  vitamin C (ASCORBIC ACID) 500 MG tablet, Take 500 mg by mouth at bedtime., Disp: , Rfl:   Past Medical History: Past Medical History:  Diagnosis Date  . Allergy   . Arthritis    s/p bilateral knees and left hip replacement   . Asthma   . CAD (coronary artery disease)    a. 12/1996 s/p CABG x4 ([place]);  b. 2005 Pt reports stress test & cath, which revealed patent grafts.  . Cancer ([place])    melanoma right arm  . Carotid arterial disease ([place])    a. 04/2015 Carotid U/S: <50% bilat ICA stenosis.  . Chronic kidney disease   . Colon polyps    H/O  . Depression   . GERD (gastroesophageal reflux disease)    h/o hiatal hernia  . Headache    migraines in past  . Heart murmur    a. 04/2011 Echo: EF 55-60%, bilat atrial enlargement, mild to mod TR.  [name] History of chicken pox   . History of hiatal hernia   . Hx of migraines    rare now  . Hx: UTI (urinary tract infection)   . Hyperlipidemia    a. Statin intolerant -->on zetia.  . Hypertensive heart disease   . Lichen planus   . Melanoma ([place])   . Palpitations    a. rare PVC's and h/o SVT.  [name] PMR (polymyalgia rheumatica) (HCC)    h/o in setting of crestor usage.  [name] PSVT (paroxysmal supraventricular tachycardia) ([place])   . PUD (peptic ulcer disease)    remote history  . Raynaud's phenomenon   . Spinal stenosis   . Urine incontinence    H/O  . Vitamin D deficiency     Tobacco Use: History  Smoking Status  . Former Smoker  Smokeless Tobacco  . Never Used    Comment: quit 44+ yrs ago    Labs: Recent Review Flowsheet Data    Labs for ITP Cardiac and Pulmonary Rehab Latest Ref Rng & Units 06/15/2014 12/02/2014 03/21/2015 09/12/2015 03/26/2016   Cholestrol 0 - 200 mg/dL 244(H) 193 208(H) 204(H) 221(H)   LDLCALC 0 - 99 mg/dL 170(H) 120(H) 140(H) 137(H) 143(H)   LDLDIRECT mg/dL - - - - -   HDL >39.00 mg/dL 46.80 50.00 49.90 49.70 54.80   Trlycerides 0.0 - 149.0 mg/dL 137.0 114.0 92.0 86.0 119.0       Exercise Target Goals:    Exercise Program Goal: Individual exercise prescription set with THRR, safety & activity barriers. Participant demonstrates ability to understand and report RPE using BORG scale, to self-measure pulse accurately, and to  acknowledge the importance of the exercise prescription.  Exercise Prescription Goal: Starting with aerobic activity 30 plus minutes a day, 3 days per week for initial exercise prescription. Provide home exercise prescription and guidelines that participant acknowledges understanding prior to discharge.  Activity Barriers & Risk Stratification:     Activity Barriers &  Cardiac Risk Stratification - 06/21/16 1434      Activity Barriers & Cardiac Risk Stratification   Activity Barriers Arthritis;Joint Problems;Shortness of Breath;Back Problems;Balance Concerns;Assistive Device;Left Hip Replacement;Left Knee Replacement;Right Knee Replacement;Neck/Spine Problems;Deconditioning;Muscular Weakness;History of Falls   Cardiac Risk Stratification High      6 Minute Walk:     6 Minute Walk    Row Name 06/21/16 1435         6 Minute Walk   Phase Initial     Distance 1060 feet     Walk Time 6 minutes     # of Rest Breaks 0     MPH 2.01     METS 1.75     RPE 13     Perceived Dyspnea  2     VO2 Peak 6.12     Symptoms Yes (comment)     Comments back pain     Resting HR 75 bpm     Resting BP 132/82     Max Ex. HR 98 bpm     Max Ex. BP 136/64     2 Minute Post BP 130/66        Initial Exercise Prescription:     Initial Exercise Prescription - 06/21/16 1400      Date of Initial Exercise RX and Referring Provider   Date 06/21/16   Referring Provider Kathlyn Sacramento MD     Treadmill   MPH 1   Grade 0   Minutes 15   METs 1.77     NuStep   Level 2   Minutes 15   METs 1.8     Biostep-RELP   Level 2   Minutes 15   METs 2     Prescription Details   Frequency (times per week) 3   Duration Progress to 45 minutes of aerobic exercise without signs/symptoms of physical distress     Intensity   THRR 40-80% of Max Heartrate 102-129   Ratings of Perceived Exertion 11-15   Perceived Dyspnea 0-4     Progression   Progression Continue to progress workloads to maintain  intensity without signs/symptoms of physical distress.     Resistance Training   Training Prescription Yes   Weight 2 lbs   Reps 10-12      Perform Capillary Blood Glucose checks as needed.  Exercise Prescription Changes:     Exercise Prescription Changes    Row Name 06/21/16 1400 07/11/16 1400 07/12/16 1700         Exercise Review   Progression -  walk test results Yes Yes       Response to Exercise   Blood Pressure (Admit) 132/82 148/74 148/74     Blood Pressure (Exercise) 136/64 142/82 142/82     Blood Pressure (Exit) 130/66 116/60 116/60     Heart Rate (Admit) 75 bpm 50 bpm 50 bpm     Heart Rate (Exercise) 98 bpm 116 bpm 116 bpm     Heart Rate (Exit)  - 71 bpm 71 bpm     Oxygen Saturation (Admit) 97 %  -  -     Rating of Perceived Exertion (Exercise) _0 Perceived Dyspnea (Exercise) 2  -  -     Symptoms back pain  -  -     Comments  -  - Home Exercise Guidelines given 07/12/16     Duration  - Progress to 45 minutes of aerobic exercise without signs/symptoms of physical distress Progress to 45  minutes of aerobic exercise without signs/symptoms of physical distress     Intensity  - THRR unchanged THRR unchanged       Progression   Progression  - Continue to progress workloads to maintain intensity without signs/symptoms of physical distress. Continue to progress workloads to maintain intensity without signs/symptoms of physical distress.     Average METs  - 2.14 2.14       Resistance Training   Training Prescription  - Yes Yes     Weight  - 3 3     Reps  - 10-12 10-12       Interval Training   Interval Training  - No No       Treadmill   MPH  - 1 1     Grade  - 0 0     Minutes  - 15 15     METs  - 1.77 1.77       NuStep   Level  - 6 6     Minutes  - 15 15     METs  - 2.5 2.5       Home Exercise Plan   Plans to continue exercise at  -  - [employer] (comment)  walking and [employer]  -  - Add 3 additional days to program  exercise sessions.        Exercise Comments:     Exercise Comments    Row Name 06/28/16 1812 07/11/16 1421 07/12/16 1723       Exercise Comments First full day of exercise!  Patient was oriented to gym and equipment including functions, settings, policies, and procedures.  Patient's individual exercise prescription and treatment plan were reviewed.  All starting workloads were established based on the results of the 6 minute walk test done at initial orientation visit.  The plan for exercise progression was also introduced and progression will be customized based on patient's performance and goals. Meilani is progressing well with exercise. Reviewed home exercise with pt today.  Pt plans to walk at home and go to class/gym at [facility] for exercise.  Reviewed THR, pulse, RPE, sign and symptoms, NTG use, and when to call 911 or MD.  Also discussed weather considerations and indoor options.  Pt voiced understanding.        Discharge Exercise Prescription (Final Exercise Prescription Changes):     Exercise Prescription Changes - 07/12/16 1700      Exercise Review   Progression Yes     Response to Exercise   Blood Pressure (Admit) 148/74   Blood Pressure (Exercise) 142/82   Blood Pressure (Exit) 116/60   Heart Rate (Admit) 50 bpm   Heart Rate (Exercise) 116 bpm   Heart Rate (Exit) 71 bpm   Rating of Perceived Exertion (Exercise) 14   Comments Home Exercise Guidelines given 07/12/16   Duration Progress to 45 minutes of aerobic exercise without signs/symptoms of physical distress   Intensity THRR unchanged     Progression   Progression Continue to progress workloads to maintain intensity without signs/symptoms of physical distress.   Average METs 2.14     Resistance Training   Training Prescription Yes   Weight 3   Reps 10-12     Interval Training   Interval Training No     Treadmill   MPH 1   Grade 0   Minutes 15   METs 1.77     NuStep   Level 6  Minutes 15   METs  2.5     Home Exercise Plan   Plans to continue exercise at [facility] (comment)  walking and [place]   Frequency Add 3 additional days to program exercise sessions.      Nutrition:  Target Goals: Understanding of nutrition guidelines, daily intake of sodium <1530m, cholesterol <2023m calories 30% from fat and 7% or less from saturated fats, daily to have 5 or more servings of fruits and vegetables.  Biometrics:     Pre Biometrics - 06/21/16 1443      Pre Biometrics   Height 5' 3.5" (1.613 m)   Weight 184 lb 8 oz (83.7 kg)   Waist Circumference 37.5 inches   Hip Circumference 47 inches   Waist to Hip Ratio 0.8 %   BMI (Calculated) 32.2   Single Leg Stand 1.1 seconds       Nutrition Therapy Plan and Nutrition Goals:     Nutrition Therapy & Goals - 06/21/16 1455      Nutrition Therapy   Diet --  AmAshtinrefers not to meet individually with the Cardiac Rehab Registered Dietician.      Nutrition Discharge: Rate Your Plate Scores:     Nutrition Assessments - 06/21/16 1456      Rate Your Plate Scores   Pre Score 58   Pre Score % 64 %      Nutrition Goals Re-Evaluation:   Psychosocial: Target Goals: Acknowledge presence or absence of depression, maximize coping skills, provide positive support system. Participant is able to verbalize types and ability to use techniques and skills needed for reducing stress and depression.  Initial Review & Psychosocial Screening:     Initial Psych Review & Screening - 06/21/16 1441      Initial Review   Current issues with History of Depression     Family Dynamics   Good Support System? Yes   Comments AmCadynceeports that she has a family history of depression. AmLoribethaid she started herself on antidepressants after she asked her husband to leave and she was raising 3 children alone plus private MD practice.     Screening Interventions   Interventions Encouraged to exercise      Quality of Life Scores:      Quality of Life - 06/21/16 1442      Quality of Life Scores   Health/Function Pre 23.18 %   Socioeconomic Pre 28.93 %   Psych/Spiritual Pre 29.14 %   Family Pre 30 %   GLOBAL Pre 26.59 %      PHQ-9: Recent Review Flowsheet Data    [facility] 06/21/2016 06/14/2016 02/23/2016 08/16/2015 12/21/2014   Decreased Interest 1 0 0 0 0   Down, Depressed, Hopeless 1 0 0 0 0   PHQ - 2 Score 2 0 0 0 0   Altered sleeping 2 - - - -   Tired, decreased energy 1 - - - -   Change in appetite 0 - - - -   Feeling bad or failure about yourself  0 - - - -   Trouble concentrating 0 - - - -   Moving slowly or fidgety/restless 0 - - - -   Suicidal thoughts 0 - - - -   PHQ-9 Score 5 - - - -   Difficult doing work/chores Somewhat difficult - - - -      Psychosocial Evaluation and Intervention:     Psychosocial Evaluation - 07/02/16 1721  Psychosocial Evaluation & Interventions   Interventions Encouraged to exercise with the program and follow exercise prescription   Comments Counselor met with Ms. Reis today for initial psychosocial evaluation.  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  Ms. Cuthrell lives in a retirement community and has a strong support system with a daughter who lives close by and active involvement in her local church.  Ms. Fong has multiple health issues and reports she is sleeping better now with some medication adjustments.  She admits to a family history of depression and anxiety and she has been taking medications for her own mood issues for quite some time.  She reports her current mood is typically positive.  Ms. Milian states her primary stressors are her health currently which includes bouts of breathlessness; as well as experiencing a great deal of pain when she stands for long periods of time; which she  attributes to her spinal stenosis which keeps her bent over and walking with a cane or walker most of the day.  Counselor will continue to follow with Ms. Liese while in this program.        Psychosocial Re-Evaluation:   Vocational Rehabilitation: Provide vocational rehab assistance to qualifying candidates.   Vocational Rehab Evaluation & Intervention:     Vocational Rehab - 06/21/16 1435      Initial Vocational Rehab Evaluation & Intervention   Assessment shows need for Vocational Rehabilitation No      Education: Education Goals: Education classes will be provided on a weekly basis, covering required topics. Participant will state understanding/return demonstration of topics presented.  Learning Barriers/Preferences:     Learning Barriers/Preferences - 06/21/16 1434      Learning Barriers/Preferences   Learning Barriers None;Exercise Concerns   Learning Preferences Audio;Group Instruction;Written Material      Education Topics: General Nutrition Guidelines/Fats and Fiber: -Group instruction provided by verbal, written material, models and posters to present the general guidelines for heart healthy nutrition. Gives an explanation and review of dietary fats and fiber.   Controlling Sodium/Reading Food Labels: -Group verbal and written material supporting the discussion of sodium use in heart healthy nutrition. Review and explanation with models, verbal and written materials for utilization of the food label.   Exercise Physiology & Risk Factors: - Group verbal and written instruction with models to review the exercise physiology of the cardiovascular system and associated critical values. Details cardiovascular disease risk factors and the goals associated with each risk factor. Flowsheet Row Cardiac Rehab from 07/16/2016 in [facility] Cardiac and Pulmonary Rehab  Date  07/09/16  Educator  [facility]  Instruction Review Code  2- meets goals/outcomes      Aerobic Exercise &  Resistance Training: - Gives group verbal and written discussion on the health impact of inactivity. On the components of aerobic and resistive training programs and the benefits of this training and how to safely progress through these programs. Flowsheet Row Cardiac Rehab from 07/16/2016 in [facility] Cardiac and Pulmonary Rehab  Date  07/16/16  Educator  [facility]  Instruction Review Code  2- meets goals/outcomes      Flexibility, Balance, General Exercise Guidelines: - Provides group verbal and written instruction on the benefits of flexibility and balance training programs. Provides general exercise guidelines with specific guidelines to those with heart or lung disease. Demonstration and skill practice provided.  Stress Management: - Provides group verbal and written instruction about the health risks of elevated stress, cause of high stress, and healthy ways to reduce stress.   Depression: - Provides group verbal and written instruction on the correlation between heart/lung disease and depressed mood, treatment options, and the stigmas associated with seeking treatment.   Anatomy & Physiology of the Heart: - Group verbal and written instruction and models provide basic cardiac anatomy and physiology, with the coronary electrical and arterial systems. Review of: AMI, Angina, Valve disease, Heart Failure, Cardiac Arrhythmia, Pacemakers, and the ICD.   Cardiac Procedures: - Group verbal and written instruction and models to describe the testing methods done to diagnose heart disease. Reviews the outcomes of the test results. Describes the treatment choices: Medical Management, Angioplasty, or Coronary Bypass Surgery.   Cardiac Medications: - Group verbal and written instruction to review commonly prescribed medications for heart disease. Reviews the medication, class of the drug, and side effects. Includes the steps to properly store meds and maintain the prescription regimen.   Go  Sex-Intimacy & Heart Disease, Get SMART - Goal Setting: - Group verbal and written instruction through game format to discuss heart disease and the return to sexual intimacy. Provides group verbal and written material to discuss and apply goal setting through the application of the S.M.A.R.T. Method.   Other Matters of the Heart: - Provides group verbal, written materials and models to describe Heart Failure, Angina, Valve Disease, and Diabetes in the realm of heart disease. Includes description of the disease process and treatment options available to the cardiac patient.   Exercise & Equipment Safety: - Individual verbal instruction and demonstration of equipment use and safety with use of the equipment. Flowsheet Row Cardiac Rehab from 07/16/2016 in [facility] Cardiac and Pulmonary Rehab  Date  06/21/16  Educator  C. EnterkinRN  Instruction Review Code  1- partially meets, needs review/practice      Infection Prevention: - Provides verbal and written material to individual with discussion of infection control including proper hand washing and proper equipment cleaning during exercise session. Flowsheet Row Cardiac Rehab from 07/16/2016 in [facility] Cardiac and Pulmonary Rehab  Date  06/21/16  Educator  C. EnterkinRN  Instruction Review Code  2- meets goals/outcomes      Falls Prevention: - Provides verbal and written material to individual with discussion of falls prevention and safety. Flowsheet Row Cardiac Rehab from 07/16/2016 in [facility] Cardiac and Pulmonary Rehab  Date  06/21/16  Educator  C. Grubbs  Instruction Review Code  2- meets goals/outcomes      Diabetes: - Individual verbal and written instruction to review signs/symptoms of diabetes, desired ranges of glucose level fasting, after meals and with exercise. Advice that pre and post exercise glucose checks will be done for 3 sessions at entry of program.    Knowledge Questionnaire Score:     Knowledge Questionnaire Score -  06/21/16 1435      Knowledge Questionnaire Score   Pre Score 26      Core Components/Risk Factors/Patient Goals at Admission:     Personal Goals and Risk Factors at Admission - 06/21/16 1440      Core Components/Risk Factors/Patient Goals on Admission   Increase Strength and Stamina Yes   Intervention Provide advice, education, support and counseling about physical activity/exercise needs.;Develop an individualized exercise prescription for aerobic and resistive training based on initial evaluation findings, risk stratification, comorbidities and participant's personal goals.   Expected Outcomes Achievement of increased cardiorespiratory fitness and enhanced  flexibility, muscular endurance and strength shown through measurements of functional capacity and personal statement of participant.   Hypertension Yes   Intervention Provide education on lifestyle modifcations including regular physical activity/exercise, weight management, moderate sodium restriction and increased consumption of fresh fruit, vegetables, and low fat dairy, alcohol moderation, and smoking cessation.;Monitor prescription use compliance.   Expected Outcomes Short Term: Continued assessment and intervention until BP is < 140/36m HG in hypertensive participants. < 130/828mHG in hypertensive participants with diabetes, heart failure or chronic kidney disease.;Long Term: Maintenance of blood pressure at goal levels.   Lipids Yes   Intervention Provide education and support for participant on nutrition & aerobic/resistive exercise along with prescribed medications to achieve LDL <7075mHDL >2m33m Expected Outcomes Short Term: Participant states understanding of desired cholesterol values and is compliant with medications prescribed. Participant is following exercise prescription and nutrition guidelines.;Long Term: Cholesterol controlled with medications as prescribed, with individualized exercise RX and with personalized  nutrition plan. Value goals: LDL < 70mg12mL > 40 mg.      Core Components/Risk Factors/Patient Goals Review:      Goals and Risk Factor Review    Row Name 07/12/16 1721             Core Components/Risk Factors/Patient Goals Review   Personal Goals Review Weight Management/Obesity;Hypertension;Lipids       Review AmandRondalynrying to "behave" with watching her diet more closely.  Her blood pressures have been lower than normal.  She is tolerating her statins without problems.       Expected Outcomes AmandMekayla continue to come to exercise and education classes.  We will continue to monitor for progression.          Core Components/Risk Factors/Patient Goals at Discharge (Final Review):      Goals and Risk Factor Review - 07/12/16 1721      Core Components/Risk Factors/Patient Goals Review   Personal Goals Review Weight Management/Obesity;Hypertension;Lipids   Review AmandMonciarying to "behave" with watching her diet more closely.  Her blood pressures have been lower than normal.  She is tolerating her statins without problems.   Expected Outcomes AmandDanett continue to come to exercise and education classes.  We will continue to monitor for progression.      ITP Comments:     ITP Comments    Row Name 06/21/16 1438 06/21/16 1442 06/28/16 1812 07/18/16 0709     ITP Comments AmandNyeemas with a cane. She uses a rolling walker at homes at times. AmandMackenseyrts history that she tripped over something and fell and bruised her leg. 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 AmandVera doesn't feel that she needs to meet individually with the Cardiac REhab REgisterd Dietician. "I know what to eat" AmandHollerts that she has a family history of depression. AmandNusaybah she started herself on antidepressants after she asked her husband to leave and she was raising 3 children alone plus private MD practice. First full day of exercise!  Patient was  oriented to gym and equipment including functions, settings, policies, and procedures.  Patient's individual exercise prescription and treatment plan were reviewed.  All starting workloads were established based on the results of the 6 minute walk test done at initial orientation visit.  The plan for exercise progression was also introduced and progression will be customized based on patient's performance and goals. 30 day review. Continue with ITP unless changes noted  by Medical Director at [place] of review.  New to program       Comments:

## 2016-07-19 DIAGNOSIS — Z9861 Coronary angioplasty status: Secondary | ICD-10-CM

## 2016-07-19 DIAGNOSIS — Z955 Presence of coronary angioplasty implant and graft: Secondary | ICD-10-CM | POA: Diagnosis not present

## 2016-07-19 DIAGNOSIS — R0602 Shortness of breath: Secondary | ICD-10-CM | POA: Diagnosis not present

## 2016-07-19 NOTE — Progress Notes (Signed)
Daily Session Note  Patient Details  Name: Charlotte Wagner MRN: [id number] Date of Birth: 1935/11/27 Referring Provider:   Flowsheet Row Cardiac Rehab from 06/21/2016 in [facility] Cardiac and [facility] Rehab  Referring Provider  Kathlyn Sacramento MD      Encounter Date: 07/19/2016  Check In:     Session Check In - 07/19/16 1659      Check-In   Location [facility]-Cardiac & Pulmonary Rehab   Staff Present Jeanell Sparrow, DPT, Burlene Arnt, BA, ACSM CEP, Exercise Physiologist;Other   Supervising physician immediately available to respond to emergencies See telemetry face sheet for immediately available ER MD   Medication changes reported     No   Fall or balance concerns reported    No   Warm-up and Cool-down Performed on first and last piece of equipment   Resistance Training Performed Yes   VAD Patient? No     Pain Assessment   Currently in Pain? No/denies         Goals Met:  Independence with exercise equipment  Goals Unmet:  Not Applicable  Comments: Patient completed exercise prescription and all exercise goals during rehab session. The exercise was tolerated well and the patient is progressing in the program.    Dr. Emily Filbert is Medical Director for [place] and [facility].

## 2016-07-23 ENCOUNTER — Encounter: Payer: Medicare Other | Admitting: *Deleted

## 2016-07-23 DIAGNOSIS — Z9861 Coronary angioplasty status: Secondary | ICD-10-CM

## 2016-07-23 DIAGNOSIS — R0602 Shortness of breath: Secondary | ICD-10-CM | POA: Diagnosis not present

## 2016-07-23 DIAGNOSIS — Z955 Presence of coronary angioplasty implant and graft: Secondary | ICD-10-CM | POA: Diagnosis not present

## 2016-07-23 NOTE — Progress Notes (Signed)
Daily Session Note  Patient Details  Name: Charlotte Wagner MRN: [id number] Date of Birth: 1936/08/05 Referring Provider:   Flowsheet Row Cardiac Rehab from 06/21/2016 in [facility] Cardiac and Pulmonary Rehab  Referring Provider  Kathlyn Sacramento MD      Encounter Date: 07/23/2016  Check In:     Session Check In - 07/23/16 1634      Check-In   Location [facility]-Cardiac & Pulmonary Rehab   Staff Present Gerlene Burdock, RN, Moises Blood, BS, ACSM CEP, Exercise Physiologist;Susanne Bice, RN, BSN, CCRP   Supervising physician immediately available to respond to emergencies See telemetry face sheet for immediately available ER MD   Medication changes reported     No   Fall or balance concerns reported    No   Warm-up and Cool-down Performed on first and last piece of equipment   Resistance Training Performed Yes   VAD Patient? No     Pain Assessment   Currently in Pain? No/denies   Multiple Pain Sites No         Goals Met:  Independence with exercise equipment Exercise tolerated well No report of cardiac concerns or symptoms Strength training completed today  Goals Unmet:  Not Applicable  Comments: Pt able to follow exercise prescription today without complaint.  Will continue to monitor for progression.    Dr. Emily Filbert is Medical Director for [place] and [facility].

## 2016-07-26 ENCOUNTER — Encounter: Payer: Medicare Other | Admitting: *Deleted

## 2016-07-26 DIAGNOSIS — Z955 Presence of coronary angioplasty implant and graft: Secondary | ICD-10-CM | POA: Diagnosis not present

## 2016-07-26 DIAGNOSIS — R0602 Shortness of breath: Secondary | ICD-10-CM | POA: Diagnosis not present

## 2016-07-26 DIAGNOSIS — Z9861 Coronary angioplasty status: Secondary | ICD-10-CM

## 2016-07-26 NOTE — Progress Notes (Signed)
Daily Session Note  Patient Details  Name: Charlotte Wagner MRN: [id number] Date of Birth: 03-10-1936 Referring Provider:   Flowsheet Row Cardiac Rehab from 06/21/2016 in [facility] Cardiac and Pulmonary Rehab  Referring Provider  Kathlyn Sacramento MD      Encounter Date: 07/26/2016  Check In:     Session Check In - 07/26/16 1721      Check-In   Location [facility]-Cardiac & Pulmonary Rehab   Staff Present Gerlene Burdock, RN, Moises Blood, BS, ACSM CEP, Exercise Physiologist;Arin Oletta Darter, [place], ACSM CEP, Exercise Physiologist   Supervising physician immediately available to respond to emergencies See telemetry face sheet for immediately available ER MD   Medication changes reported     No   Fall or balance concerns reported    No   Warm-up and Cool-down Performed on first and last piece of equipment   Resistance Training Performed Yes   VAD Patient? No     Pain Assessment   Currently in Pain? No/denies   Multiple Pain Sites No           Exercise Prescription Changes - 07/26/16 1200      Exercise Review   Progression Yes     Response to Exercise   Blood Pressure (Admit) 132/64   Blood Pressure (Exercise) 148/70   Blood Pressure (Exit) 124/66   Heart Rate (Admit) 68 bpm   Heart Rate (Exercise) 110 bpm   Heart Rate (Exit) 68 bpm   Rating of Perceived Exertion (Exercise) 14   Duration Progress to 45 minutes of aerobic exercise without signs/symptoms of physical distress   Intensity THRR unchanged     Progression   Progression Continue to progress workloads to maintain intensity without signs/symptoms of physical distress.   Average METs 2.9     Resistance Training   Training Prescription Yes   Weight 3   Reps 10-12     Interval Training   Interval Training No     Treadmill   MPH 1.9   Grade 0   Minutes 15     NuStep   Level 6   Minutes 15   METs 2.9      Goals Met:  Independence with exercise equipment Exercise tolerated well No report of cardiac concerns  or symptoms Strength training completed today  Goals Unmet:  Not Applicable  Comments: Pt able to follow exercise prescription today without complaint.  Will continue to monitor for progression.    Dr. Emily Filbert is Medical Director for [place] and [facility].

## 2016-07-30 ENCOUNTER — Encounter: Payer: Medicare Other | Admitting: *Deleted

## 2016-07-30 DIAGNOSIS — R0602 Shortness of breath: Secondary | ICD-10-CM | POA: Diagnosis not present

## 2016-07-30 DIAGNOSIS — Z955 Presence of coronary angioplasty implant and graft: Secondary | ICD-10-CM | POA: Diagnosis not present

## 2016-07-30 DIAGNOSIS — Z9861 Coronary angioplasty status: Secondary | ICD-10-CM

## 2016-07-30 NOTE — Progress Notes (Signed)
Daily Session Note  Patient Details  Name: IRAM ASTORINO MRN: [id number] Date of Birth: [birth year] Referring Provider:   Flowsheet Row Cardiac Rehab from 06/21/2016 in [facility] Cardiac and Pulmonary Rehab  Referring Provider  Kathlyn Sacramento MD      Encounter Date: 07/30/2016  Check In:     Session Check In - 07/30/16 1810      Check-In   Location [facility]-Cardiac & Pulmonary Rehab   Staff Present Nyoka Cowden, RN, BSN, MA;Keven Soucy, RN, Moises Blood, BS, ACSM CEP, Exercise Physiologist   Supervising physician immediately available to respond to emergencies See telemetry face sheet for immediately available ER MD   Medication changes reported     No   Fall or balance concerns reported    No   Warm-up and Cool-down Performed on first and last piece of equipment   Resistance Training Performed Yes   VAD Patient? No     Pain Assessment   Currently in Pain? No/denies         Goals Met:  Proper associated with RPD/PD & O2 Sat No report of cardiac concerns or symptoms  Goals Unmet:  Not Applicable  Comments:     Dr. Emily Filbert is Medical Director for [place] and [facility].

## 2016-08-02 ENCOUNTER — Encounter: Payer: Medicare Other | Admitting: *Deleted

## 2016-08-02 DIAGNOSIS — Z955 Presence of coronary angioplasty implant and graft: Secondary | ICD-10-CM | POA: Diagnosis not present

## 2016-08-02 DIAGNOSIS — R0602 Shortness of breath: Secondary | ICD-10-CM | POA: Diagnosis not present

## 2016-08-02 DIAGNOSIS — Z9861 Coronary angioplasty status: Secondary | ICD-10-CM

## 2016-08-02 NOTE — Progress Notes (Signed)
Daily Session Note  Patient Details  Name: Charlotte Wagner MRN: [id number] Date of Birth: 02/06/1936 Referring Provider:   Flowsheet Row Cardiac Rehab from 06/21/2016 in [facility] Cardiac and Pulmonary Rehab  Referring Provider  Arida, Muhammad MD      Encounter Date: 08/02/2016  Check In:     Session Check In - 08/02/16 1708      Check-In   Location [facility]-Cardiac & Pulmonary Rehab   Staff Present Carroll Enterkin, RN, BSN;Kelly Hayes, BS, ACSM CEP, Exercise Physiologist;Lowen Sommer, BA, ACSM CEP, Exercise Physiologist   Supervising physician immediately available to respond to emergencies See telemetry face sheet for immediately available ER MD   Medication changes reported     No   Fall or balance concerns reported    No   Warm-up and Cool-down Performed on first and last piece of equipment   Resistance Training Performed Yes   VAD Patient? No     Pain Assessment   Currently in Pain? No/denies   Multiple Pain Sites No         Goals Met:  Independence with exercise equipment Exercise tolerated well No report of cardiac concerns or symptoms Strength training completed today  Goals Unmet:  Not Applicable  Comments: Pt able to follow exercise prescription today without complaint.  Will continue to monitor for progression.    Dr. Mark Miller is Medical Director for [facility] and [facility]. 

## 2016-08-06 ENCOUNTER — Encounter: Payer: Medicare Other | Admitting: *Deleted

## 2016-08-06 DIAGNOSIS — Z955 Presence of coronary angioplasty implant and graft: Secondary | ICD-10-CM | POA: Diagnosis not present

## 2016-08-06 DIAGNOSIS — R0602 Shortness of breath: Secondary | ICD-10-CM | POA: Diagnosis not present

## 2016-08-06 DIAGNOSIS — Z9861 Coronary angioplasty status: Secondary | ICD-10-CM

## 2016-08-06 NOTE — Progress Notes (Signed)
Daily Session Note  Patient Details  Name: Charlotte Wagner MRN: [id number] Date of Birth: 07-Sep-1936 Referring Provider:   Flowsheet Row Cardiac Rehab from 06/21/2016 in [facility] Cardiac and Pulmonary Rehab  Referring Provider  Kathlyn Sacramento MD      Encounter Date: 08/06/2016  Check In:     Session Check In - 08/06/16 1808      Check-In   Staff Present Heath Lark, RN, BSN, CCRP;Carroll Enterkin, RN, Moises Blood, BS, ACSM CEP, Exercise Physiologist   Supervising physician immediately available to respond to emergencies See telemetry face sheet for immediately available ER MD   Medication changes reported     No   Fall or balance concerns reported    No   Warm-up and Cool-down Performed on first and last piece of equipment   Resistance Training Performed Yes   VAD Patient? No     Pain Assessment   Currently in Pain? No/denies         Goals Met:  Independence with exercise equipment Exercise tolerated well No report of cardiac concerns or symptoms Strength training completed today  Goals Unmet:  Not Applicable  Comments: Doing well with exercise prescription progression.    Dr. Emily Filbert is Medical Director for [place] and [facility].

## 2016-08-09 ENCOUNTER — Other Ambulatory Visit: Payer: Self-pay | Admitting: Internal Medicine

## 2016-08-09 ENCOUNTER — Encounter: Payer: Medicare Other | Attending: Cardiovascular Disease | Admitting: *Deleted

## 2016-08-09 DIAGNOSIS — D485 Neoplasm of uncertain behavior of skin: Secondary | ICD-10-CM | POA: Diagnosis not present

## 2016-08-09 DIAGNOSIS — Z85828 Personal history of other malignant neoplasm of skin: Secondary | ICD-10-CM | POA: Diagnosis not present

## 2016-08-09 DIAGNOSIS — X32XXXA Exposure to sunlight, initial encounter: Secondary | ICD-10-CM | POA: Diagnosis not present

## 2016-08-09 DIAGNOSIS — L570 Actinic keratosis: Secondary | ICD-10-CM | POA: Diagnosis not present

## 2016-08-09 DIAGNOSIS — Z955 Presence of coronary angioplasty implant and graft: Secondary | ICD-10-CM | POA: Diagnosis not present

## 2016-08-09 DIAGNOSIS — Z9861 Coronary angioplasty status: Secondary | ICD-10-CM

## 2016-08-09 DIAGNOSIS — C44319 Basal cell carcinoma of skin of other parts of face: Secondary | ICD-10-CM | POA: Diagnosis not present

## 2016-08-09 DIAGNOSIS — Z85820 Personal history of malignant melanoma of skin: Secondary | ICD-10-CM | POA: Diagnosis not present

## 2016-08-09 DIAGNOSIS — Z08 Encounter for follow-up examination after completed treatment for malignant neoplasm: Secondary | ICD-10-CM | POA: Diagnosis not present

## 2016-08-09 NOTE — Progress Notes (Signed)
Daily Session Note  Patient Details  Name: Charlotte Wagner MRN: [id number] Date of Birth: [birth year] Referring Provider:   Flowsheet Row Cardiac Rehab from 06/21/2016 in [facility] Cardiac and Pulmonary Rehab  Referring Provider  Kathlyn Sacramento MD      Encounter Date: 08/09/2016  Check In:     Session Check In - 08/09/16 1710      Check-In   Location [facility]-Cardiac & Pulmonary Rehab   Staff Present Heath Lark, RN, BSN, Laveda Norman, BS, ACSM CEP, Exercise Physiologist;Media Oletta Darter, BA, ACSM CEP, Exercise Physiologist;Rebecca Brayton El, DPT, CEEA   Supervising physician immediately available to respond to emergencies See telemetry face sheet for immediately available ER MD   Medication changes reported     No   Fall or balance concerns reported    No   Warm-up and Cool-down Performed on first and last piece of equipment   Resistance Training Performed Yes   VAD Patient? No     Pain Assessment   Currently in Pain? No/denies   Multiple Pain Sites No         Goals Met:  Independence with exercise equipment Exercise tolerated well No report of cardiac concerns or symptoms Strength training completed today  Goals Unmet:  Not Applicable  Comments: Pt able to follow exercise prescription today without complaint.  Will continue to monitor for progression.    Dr. Emily Filbert is Medical Director for [place] and [facility].

## 2016-08-10 NOTE — Telephone Encounter (Signed)
Last filled 04/17/16 30 2rf

## 2016-08-12 ENCOUNTER — Other Ambulatory Visit: Payer: Self-pay | Admitting: Internal Medicine

## 2016-08-13 ENCOUNTER — Encounter: Payer: Medicare Other | Admitting: *Deleted

## 2016-08-13 DIAGNOSIS — Z9861 Coronary angioplasty status: Secondary | ICD-10-CM

## 2016-08-13 DIAGNOSIS — Z955 Presence of coronary angioplasty implant and graft: Secondary | ICD-10-CM | POA: Diagnosis not present

## 2016-08-13 NOTE — Progress Notes (Signed)
Daily Session Note  Patient Details  Name: Charlotte Wagner MRN: [id number] Date of Birth: 1935/12/30 Referring Provider:   Flowsheet Row Cardiac Rehab from 06/21/2016 in [facility] Cardiac and Pulmonary Rehab  Referring Provider  Kathlyn Sacramento MD      Encounter Date: 08/13/2016  Check In:     Session Check In - 08/13/16 1658      Check-In   Location [facility]-Cardiac & Pulmonary Rehab   Staff Present Gerlene Burdock, RN, BSN   Supervising physician immediately available to respond to emergencies See telemetry face sheet for immediately available ER MD   Medication changes reported     No   Fall or balance concerns reported    No   Warm-up and Cool-down Performed on first and last piece of equipment   Resistance Training Performed Yes   VAD Patient? No     Pain Assessment   Currently in Pain? No/denies         Goals Met:  Exercise tolerated well No report of cardiac concerns or symptoms Strength training completed today  Goals Unmet:  Not Applicable  Comments: Doing well with exercise prescription progression.    Dr. Emily Filbert is Medical Director for [place] and [facility].

## 2016-08-13 NOTE — Progress Notes (Signed)
Daily Session Note  Patient Details  Name: Charlotte Wagner MRN: [id number] Date of Birth: [birth year] Referring Provider:   Flowsheet Row Cardiac Rehab from 06/21/2016 in [facility] Cardiac and Pulmonary Rehab  Referring Provider  Kathlyn Sacramento MD      Encounter Date: 08/13/2016  Check In:     Session Check In - 08/13/16 1658      Check-In   Location [facility]-Cardiac & Pulmonary Rehab   Staff Present Gerlene Burdock, RN, BSN   Supervising physician immediately available to respond to emergencies See telemetry face sheet for immediately available ER MD   Medication changes reported     No   Fall or balance concerns reported    No   Warm-up and Cool-down Performed on first and last piece of equipment   Resistance Training Performed Yes   VAD Patient? No     Pain Assessment   Currently in Pain? No/denies         Goals Met:  Proper associated with RPD/PD & O2 Sat Exercise tolerated well  Goals Unmet:  Not Applicable  Comments:     Dr. Emily Filbert is Medical Director for [place] and [facility].

## 2016-08-15 ENCOUNTER — Encounter: Payer: Self-pay | Admitting: *Deleted

## 2016-08-15 DIAGNOSIS — Z9861 Coronary angioplasty status: Secondary | ICD-10-CM

## 2016-08-15 NOTE — Progress Notes (Signed)
Cardiac Individual Treatment Plan  Patient Details  Name: Charlotte Wagner MRN: [id number] Date of Birth: January 07, 1936 Referring Provider:   Flowsheet Row Cardiac Rehab from 06/21/2016 in [facility] Cardiac and Pulmonary Rehab  Referring Provider  Kathlyn Sacramento MD      Initial Encounter Date:  Flowsheet Row Cardiac Rehab from 06/21/2016 in [facility] Cardiac and Pulmonary Rehab  Date  06/21/16  Referring Provider  Kathlyn Sacramento MD      Visit Diagnosis: S/P PTCA (percutaneous transluminal coronary angioplasty)  Patient's Home Medications on Admission:  Current Outpatient Prescriptions:  .  acetaminophen (TYLENOL) 500 MG tablet, Take 1,000 mg by mouth 2 (two) times daily. , Disp: , Rfl:  .  amitriptyline (ELAVIL) 25 MG tablet, Take 25 mg by mouth at bedtime., Disp: , Rfl:  .  b complex vitamins tablet, Take 1 tablet by mouth daily., Disp: , Rfl:  .  bisoprolol (ZEBETA) 5 MG tablet, Take 2.5 mg by mouth at bedtime., Disp: , Rfl:  .  Cholecalciferol (VITAMIN D3) 2000 UNITS TABS, Take 1 capsule by mouth daily. , Disp: , Rfl:  .  Coenzyme Q10 (COQ-10) 100 MG CAPS, Take 1 tablet by mouth daily., Disp: , Rfl:  .  ezetimibe (ZETIA) 10 MG tablet, Take 10 mg by mouth at bedtime., Disp: , Rfl:  .  folic acid (FOLVITE) 1 MG tablet, take 1 tablet by mouth once daily, Disp: 30 tablet, Rfl: 5 .  hydrocortisone 2.5 % cream, apply to VULVA twice a day AT 10AM AND 4 PM, Disp: 28 g, Rfl: 0 .  ketoconazole (NIZORAL) 2 % cream, Apply 1 application topically daily. (Patient taking differently: Apply 1 application topically daily as needed for irritation. ), Disp: 30 g, Rfl: 0 .  loratadine (CLARITIN) 10 MG tablet, Take 10 mg by mouth daily., Disp: , Rfl:  .  meclizine (ANTIVERT) 25 MG tablet, Take 25 mg by mouth 3 (three) times daily as needed for dizziness., Disp: , Rfl:  .  miconazole (MICOTIN) 2 % cream, Apply 1 application topically 2 (two) times daily as needed (irritation)., Disp: , Rfl:  .  Multiple  Vitamins-Minerals (HAIR/SKIN/NAILS PO), Take 1 tablet by mouth daily., Disp: , Rfl:  .  nitroGLYCERIN (NITROSTAT) 0.4 MG SL tablet, Place 1 tablet (0.4 mg total) under the tongue every 5 (five) minutes as needed for chest pain. Place 1 tablet (0.4 mg total) under the tongue every 5 (five) minutes , 3 times as needed for chest pain., Disp: 15 tablet, Rfl: 0 .  Omega-3 Fatty Acids (FISH OIL PO), Take 2,000 mg by mouth daily., Disp: , Rfl:  .  pantoprazole (PROTONIX) 40 MG tablet, take 1 tablet by mouth twice a day, Disp: 60 tablet, Rfl: 5 .  Polyethylene Glycol 3350 (MIRALAX PO), Take 8.5 g by mouth at bedtime. , Disp: , Rfl:  .  ramipril (ALTACE) 10 MG capsule, Take 10 mg by mouth at bedtime., Disp: , Rfl:  .  ramipril (ALTACE) 10 MG capsule, take 1 capsule by mouth once daily, Disp: 30 capsule, Rfl: 3 .  spironolactone (ALDACTONE) 25 MG tablet, take 1 tablet by mouth every morning, Disp: 30 tablet, Rfl: 5 .  sucralfate (CARAFATE) 1 g tablet, Take 1 g by mouth 2 (two) times daily as needed (acid reflux)., Disp: , Rfl:  .  ticagrelor (BRILINTA) 90 MG TABS tablet, Take 1 tablet (90 mg total) by mouth 2 (two) times daily., Disp: 60 tablet, Rfl: 2 .  traMADol (ULTRAM) 50 MG tablet, Take 1  tablet (50 mg total) by mouth daily as needed. (Patient taking differently: Take 50 mg by mouth daily as needed for moderate pain. ), Disp: 20 tablet, Rfl: 0 .  traZODone (DESYREL) 150 MG tablet, Take 275 mg by mouth at bedtime. Patient takes 1 and 2/3 tablets , Disp: , Rfl:  .  traZODone (DESYREL) 150 MG tablet, take 1 and 2/3 tablets to 2 tablets by mouth at bedtime as directed, Disp: 60 tablet, Rfl: 2 .  verapamil (CALAN-SR) 180 MG CR tablet, take 1 tablet by mouth twice a day, Disp: 60 tablet, Rfl: 5 .  vitamin B-12 (CYANOCOBALAMIN) 1000 MCG tablet, Take 1,000 mcg by mouth daily., Disp: , Rfl:  .  vitamin C (ASCORBIC ACID) 500 MG tablet, Take 500 mg by mouth at bedtime., Disp: , Rfl:   Past Medical History: Past  Medical History:  Diagnosis Date  . Allergy   . Arthritis    s/p bilateral knees and left hip replacement  . Asthma   . CAD (coronary artery disease)    a. 12/1996 s/p CABG x4 ([place]);  b. 2005 Pt reports stress test & cath, which revealed patent grafts.  . Cancer ([place])    melanoma right arm  . Carotid arterial disease ([place])    a. 04/2015 Carotid U/S: <50% bilat ICA stenosis.  . Chronic kidney disease   . Colon polyps    H/O  . Depression   . GERD (gastroesophageal reflux disease)    h/o hiatal hernia  . Headache    migraines in past  . Heart murmur    a. 04/2011 Echo: EF 55-60%, bilat atrial enlargement, mild to mod TR.  [name] History of chicken pox   . History of hiatal hernia   . Hx of migraines    rare now  . Hx: UTI (urinary tract infection)   . Hyperlipidemia    a. Statin intolerant -->on zetia.  . Hypertensive heart disease   . Lichen planus   . Melanoma ([place])   . Palpitations    a. rare PVC's and h/o SVT.  [name] PMR (polymyalgia rheumatica) (HCC)    h/o in setting of crestor usage.  [name] PSVT (paroxysmal supraventricular tachycardia) ([place])   . PUD (peptic ulcer disease)    remote history  . Raynaud's phenomenon   . Spinal stenosis   . Urine incontinence    H/O  . Vitamin D deficiency     Tobacco Use: History  Smoking Status  . Former Smoker  Smokeless Tobacco  . Never Used    Comment: quit 44+ yrs ago    Labs: Recent Review Flowsheet Data    Labs for ITP Cardiac and Pulmonary Rehab Latest Ref Rng & Units 06/15/2014 12/02/2014 03/21/2015 09/12/2015 03/26/2016   Cholestrol 0 - 200 mg/dL 244(H) 193 208(H) 204(H) 221(H)   LDLCALC 0 - 99 mg/dL 170(H) 120(H) 140(H) 137(H) 143(H)   LDLDIRECT mg/dL - - - - -   HDL >39.00 mg/dL 46.80 50.00 49.90 49.70 54.80   Trlycerides 0.0 - 149.0 mg/dL 137.0 114.0 92.0 86.0 119.0       Exercise Target Goals:    Exercise Program Goal: Individual exercise prescription set with THRR, safety & activity barriers. Participant  demonstrates ability to understand and report RPE using BORG scale, to self-measure pulse accurately, and to acknowledge the importance of the exercise prescription.  Exercise Prescription Goal: Starting with aerobic activity 30 plus minutes a day, 3 days per week for initial exercise prescription. Provide home exercise prescription  and guidelines that participant acknowledges understanding prior to discharge.  Activity Barriers & Risk Stratification:     Activity Barriers & Cardiac Risk Stratification - 06/21/16 1434      Activity Barriers & Cardiac Risk Stratification   Activity Barriers Arthritis;Joint Problems;Shortness of Breath;Back Problems;Balance Concerns;Assistive Device;Left Hip Replacement;Left Knee Replacement;Right Knee Replacement;Neck/Spine Problems;Deconditioning;Muscular Weakness;History of Falls   Cardiac Risk Stratification High      6 Minute Walk:     6 Minute Walk    Row Name 06/21/16 1435         6 Minute Walk   Phase Initial     Distance 1060 feet     Walk Time 6 minutes     # of Rest Breaks 0     MPH 2.01     METS 1.75     RPE 13     Perceived Dyspnea  2     VO2 Peak 6.12     Symptoms Yes (comment)     Comments back pain     Resting HR 75 bpm     Resting BP 132/82     Max Ex. HR 98 bpm     Max Ex. BP 136/64     2 Minute Post BP 130/66        Initial Exercise Prescription:     Initial Exercise Prescription - 06/21/16 1400      Date of Initial Exercise RX and Referring Provider   Date 06/21/16   Referring Provider Kathlyn Sacramento MD     Treadmill   MPH 1   Grade 0   Minutes 15   METs 1.77     NuStep   Level 2   Minutes 15   METs 1.8     Biostep-RELP   Level 2   Minutes 15   METs 2     Prescription Details   Frequency (times per week) 3   Duration Progress to 45 minutes of aerobic exercise without signs/symptoms of physical distress     Intensity   THRR 40-80% of Max Heartrate 102-129   Ratings of Perceived Exertion  11-15   Perceived Dyspnea 0-4     Progression   Progression Continue to progress workloads to maintain intensity without signs/symptoms of physical distress.     Resistance Training   Training Prescription Yes   Weight 2 lbs   Reps 10-12      Perform Capillary Blood Glucose checks as needed.  Exercise Prescription Changes:     Exercise Prescription Changes    Row Name 06/21/16 1400 07/11/16 1400 07/12/16 1700 07/26/16 1200 08/08/16 1300     Exercise Review   Progression -  walk test results Yes Yes Yes Yes     Response to Exercise   Blood Pressure (Admit) 132/82 148/74 148/74 132/64 130/80   Blood Pressure (Exercise) 136/64 142/82 142/82 148/70  -   Blood Pressure (Exit) 130/66 116/60 116/60 124/66 124/70   Heart Rate (Admit) 75 bpm 50 bpm 50 bpm 68 bpm 80 bpm   Heart Rate (Exercise) 98 bpm 116 bpm 116 bpm 110 bpm 105 bpm   Heart Rate (Exit)  - 71 bpm 71 bpm 68 bpm 66 bpm   Oxygen Saturation (Admit) 97 %  -  -  -  -   Rating of Perceived Exertion (Exercise) _0 Perceived Dyspnea (Exercise) 2  -  -  -  -   Symptoms back pain  -  -  -  -  Comments  -  - Home Exercise Guidelines given 07/12/16  -  -   Duration  - Progress to 45 minutes of aerobic exercise without signs/symptoms of physical distress Progress to 45 minutes of aerobic exercise without signs/symptoms of physical distress Progress to 45 minutes of aerobic exercise without signs/symptoms of physical distress Progress to 45 minutes of aerobic exercise without signs/symptoms of physical distress   Intensity  - THRR unchanged THRR unchanged THRR unchanged THRR unchanged     Progression   Progression  - Continue to progress workloads to maintain intensity without signs/symptoms of physical distress. Continue to progress workloads to maintain intensity without signs/symptoms of physical distress. Continue to progress workloads to maintain intensity without signs/symptoms of physical distress. Continue to  progress workloads to maintain intensity without signs/symptoms of physical distress.   Average METs  - 2.14 2.14 2.9 2.53     Resistance Training   Training Prescription  - Yes Yes Yes Yes   Weight  - _0 Reps  - 10-12 10-12 10-12 10-12     Interval Training   Interval Training  - No No No No     Treadmill   MPH  - 1 1 1.9 2   Grade  - 0 0 0 0   Minutes  - _1 METs  - 1.77 1.77  - 2.53     NuStep   Level  - _2 -   Minutes  - _3 -   METs  - 2.5 2.5 2.9  -     Biostep-RELP   Level  -  -  -  - 3   Minutes  -  -  -  - 15     Home Exercise Plan   Plans to continue exercise at  -  - [employer] (comment)  walking and [employer]  -  -   Frequency  -  - Add 3 additional days to program exercise sessions.  -  -      Exercise Comments:     Exercise Comments    Row Name 06/28/16 1812 07/11/16 1421 07/12/16 1723 07/26/16 1246 08/08/16 1314   Exercise Comments First full day of exercise!  Patient was oriented to gym and equipment including functions, settings, policies, and procedures.  Patient's individual exercise prescription and treatment plan were reviewed.  All starting workloads were established based on the results of the 6 minute walk test done at initial orientation visit.  The plan for exercise progression was also introduced and progression will be customized based on patient's performance and goals. Charlotte Wagner is progressing well with exercise. Reviewed home exercise with pt today.  Pt plans to walk at home and go [facility] class/gym at [facility] for exercise.  Reviewed THR, pulse, RPE, sign and symptoms, NTG use, and when to call 911 or MD.  Also discussed weather considerations and indoor options.  Pt voiced understanding. Charlotte Wagner continues to progress well with exercise. Charlotte Wagner is porgressing well with exercise.      Discharge Exercise Prescription (Final Exercise Prescription Changes):     Exercise Prescription Changes - 08/08/16 1300       Exercise Review   Progression Yes     Response to Exercise   Blood Pressure (Admit) 130/80   Blood Pressure (Exit) 124/70   Heart Rate (Admit) 80 bpm   Heart Rate (Exercise) 105 bpm   Heart Rate (Exit) 66 bpm  Rating of Perceived Exertion (Exercise) 13   Duration Progress to 45 minutes of aerobic exercise without signs/symptoms of physical distress   Intensity THRR unchanged     Progression   Progression Continue to progress workloads to maintain intensity without signs/symptoms of physical distress.   Average METs 2.53     Resistance Training   Training Prescription Yes   Weight 3   Reps 10-12     Interval Training   Interval Training No     Treadmill   MPH 2   Grade 0   Minutes 15   METs 2.53     Biostep-RELP   Level 3   Minutes 15      Nutrition:  Target Goals: Understanding of nutrition guidelines, daily intake of sodium <1590m, cholesterol <2030m calories 30% from fat and 7% or less from saturated fats, daily to have 5 or more servings of fruits and vegetables.  Biometrics:     Pre Biometrics - 06/21/16 1443      Pre Biometrics   Height 5' 3.5" (1.613 m)   Weight 184 lb 8 oz (83.7 kg)   Waist Circumference 37.5 inches   Hip Circumference 47 inches   Waist to Hip Ratio 0.8 %   BMI (Calculated) 32.2   Single Leg Stand 1.1 seconds       Nutrition Therapy Plan and Nutrition Goals:     Nutrition Therapy & Goals - 06/21/16 1455      Nutrition Therapy   Diet --  AmEstephaniarefers not to meet individually with the Cardiac Rehab Registered Dietician.      Nutrition Discharge: Rate Your Plate Scores:     Nutrition Assessments - 06/21/16 1456      Rate Your Plate Scores   Pre Score 58   Pre Score % 64 %      Nutrition Goals Re-Evaluation:     Nutrition Goals Re-Evaluation    [place] 08/06/16 1406             Personal Goal #1 Re-Evaluation   Personal Goal #1 Cont heart healthy diet.       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 "I know what to eat but always I haven't eaten correctly"          Psychosocial: Target Goals: Acknowledge presence or absence of depression, maximize coping skills, provide positive support system. Participant is able to verbalize types and ability to use techniques and skills needed for reducing stress and depression.  Initial Review & Psychosocial Screening:     Initial Psych Review & Screening - 06/21/16 1441      Initial Review   Current issues with History of Depression     Family Dynamics   Good Support System? Yes   Comments AmTheressaeports that she has a family history of depression. Charlotte Wagner she started herself on antidepressants after she asked her husband to leave and she was raising 3 children alone plus private MD practice.     Screening Interventions   Interventions Encouraged to exercise      Quality of Life Scores:     Quality of Life - 06/21/16 1442      Quality of Life Scores   Health/Function Pre 23.18 %   Socioeconomic Pre 28.93 %   Psych/Spiritual Pre 29.14 %   Family Pre 30 %   GLOBAL Pre 26.59 %  PHQ-9: Recent Review Flowsheet Data    Depression screen [facility] 2/9 06/21/2016 06/14/2016 02/23/2016 08/16/2015 12/21/2014   Decreased Interest 1 0 0 0 0   Down, Depressed, Hopeless 1 0 0 0 0   PHQ - 2 Score 2 0 0 0 0   Altered sleeping 2 - - - -   Tired, decreased energy 1 - - - -   Change in appetite 0 - - - -   Feeling bad or failure about yourself  0 - - - -   Trouble concentrating 0 - - - -   Moving slowly or fidgety/restless 0 - - - -   Suicidal thoughts 0 - - - -   PHQ-9 Score 5 - - - -   Difficult doing work/chores Somewhat difficult - - - -      Psychosocial Evaluation and Intervention:     Psychosocial Evaluation - 07/02/16 1721      Psychosocial Evaluation & Interventions   Interventions Encouraged to exercise with the program and follow exercise  prescription   Comments Counselor met with Charlotte Wagner today for initial psychosocial evaluation.  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  Charlotte Wagner lives in a retirement community and has a strong support system with a daughter who lives close by and active involvement in her local church.  Charlotte Wagner has multiple health issues and reports she is sleeping better now with some medication adjustments.  She admits to a family history of depression and anxiety and she has been taking medications for her own mood issues for quite some time.  She reports her current mood is typically positive.  Ms. Gough states her primary stressors are her health currently which includes bouts of breathlessness; as well as experiencing a great deal of pain when she stands for long periods of time; which she attributes to her spinal stenosis which keeps her bent over and walking with a cane or walker most of the day.  Counselor will continue to follow with Charlotte Wagner while in this program.        Psychosocial Re-Evaluation:     Psychosocial Re-Evaluation    [place] Name 08/06/16 1412             Psychosocial Re-Evaluation   Interventions Encouraged to attend Cardiac Rehabilitation for the exercise       Comments Charlotte Wagner said it was stressful getting a notice from [facility] that they would not pay for what Medicare doesn't pay for. Charlotte Wagner said she was glad to find out that Medicare does cover Cardiac Rehab.           Vocational Rehabilitation: Provide vocational rehab assistance to qualifying candidates.   Vocational Rehab Evaluation & Intervention:     Vocational Rehab - 06/21/16 1435      Initial Vocational Rehab Evaluation & Intervention   Assessment shows need for Vocational Rehabilitation No      Education: Education Goals: Education classes  will be provided on a weekly basis, covering required topics. Participant will state understanding/return demonstration of topics presented.  Learning Barriers/Preferences:     Learning Barriers/Preferences - 06/21/16 1434      Learning Barriers/Preferences   Learning Barriers None;Exercise Concerns   Learning Preferences Audio;Group Instruction;Written Material      Education Topics: General Nutrition Guidelines/Fats and Fiber: -  Group instruction provided by verbal, written material, models and posters to present the general guidelines for heart healthy nutrition. Gives an explanation and review of dietary fats and fiber.   Controlling Sodium/Reading Food Labels: -Group verbal and written material supporting the discussion of sodium use in heart healthy nutrition. Review and explanation with models, verbal and written materials for utilization of the food label.   Exercise Physiology & Risk Factors: - Group verbal and written instruction with models to review the exercise physiology of the cardiovascular system and associated critical values. Details cardiovascular disease risk factors and the goals associated with each risk factor. Flowsheet Row Cardiac Rehab from 07/23/2016 in [facility] Cardiac and Pulmonary Rehab  Date  07/09/16  Educator  [facility]  Instruction Review Code  2- meets goals/outcomes      Aerobic Exercise & Resistance Training: - Gives group verbal and written discussion on the health impact of inactivity. On the components of aerobic and resistive training programs and the benefits of this training and how to safely progress through these programs. Flowsheet Row Cardiac Rehab from 07/23/2016 in [facility] Cardiac and Pulmonary Rehab  Date  07/16/16  Educator  [facility]  Instruction Review Code  2- meets goals/outcomes      Flexibility, Balance, General Exercise Guidelines: - Provides group verbal and written instruction on the benefits of flexibility and balance training programs.  Provides general exercise guidelines with specific guidelines to those with heart or lung disease. Demonstration and skill practice provided.   Stress Management: - Provides group verbal and written instruction about the health risks of elevated stress, cause of high stress, and healthy ways to reduce stress.   Depression: - Provides group verbal and written instruction on the correlation between heart/lung disease and depressed mood, treatment options, and the stigmas associated with seeking treatment.   Anatomy & Physiology of the Heart: - Group verbal and written instruction and models provide basic cardiac anatomy and physiology, with the coronary electrical and arterial systems. Review of: AMI, Angina, Valve disease, Heart Failure, Cardiac Arrhythmia, Pacemakers, and the ICD. Flowsheet Row Cardiac Rehab from 07/23/2016 in [facility] Cardiac and Pulmonary Rehab  Date  07/23/16  Educator  CE  Instruction Review Code  2- meets goals/outcomes      Cardiac Procedures: - Group verbal and written instruction and models to describe the testing methods done to diagnose heart disease. Reviews the outcomes of the test results. Describes the treatment choices: Medical Management, Angioplasty, or Coronary Bypass Surgery.   Cardiac Medications: - Group verbal and written instruction to review commonly prescribed medications for heart disease. Reviews the medication, class of the drug, and side effects. Includes the steps to properly store meds and maintain the prescription regimen.   Go Sex-Intimacy & Heart Disease, Get SMART - Goal Setting: - Group verbal and written instruction through game format to discuss heart disease and the return to sexual intimacy. Provides group verbal and written material to discuss and apply goal setting through the application of the S.M.A.R.T. Method.   Other Matters of the Heart: - Provides group verbal, written materials and models to describe Heart Failure,  Angina, Valve Disease, and Diabetes in the realm of heart disease. Includes description of the disease process and treatment options available to the cardiac patient. Flowsheet Row Cardiac Rehab from 07/23/2016 in [facility] Cardiac and Pulmonary Rehab  Date  07/23/16  Educator  CE  Instruction Review Code  2- meets goals/outcomes      Exercise & Equipment Safety: - Individual verbal instruction  and demonstration of equipment use and safety with use of the equipment. Flowsheet Row Cardiac Rehab from 07/23/2016 in [facility] Cardiac and Pulmonary Rehab  Date  06/21/16  Educator  C. EnterkinRN  Instruction Review Code  1- partially meets, needs review/practice      Infection Prevention: - Provides verbal and written material to individual with discussion of infection control including proper hand washing and proper equipment cleaning during exercise session. Flowsheet Row Cardiac Rehab from 07/23/2016 in [facility] Cardiac and Pulmonary Rehab  Date  06/21/16  Educator  C. EnterkinRN  Instruction Review Code  2- meets goals/outcomes      Falls Prevention: - Provides verbal and written material to individual with discussion of falls prevention and safety. Flowsheet Row Cardiac Rehab from 07/23/2016 in [facility] Cardiac and Pulmonary Rehab  Date  06/21/16  Educator  C. [place]  Instruction Review Code  2- meets goals/outcomes      Diabetes: - Individual verbal and written instruction to review signs/symptoms of diabetes, desired ranges of glucose level fasting, after meals and with exercise. Advice that pre and post exercise glucose checks will be done for 3 sessions at entry of program.    Knowledge Questionnaire Score:     Knowledge Questionnaire Score - 06/21/16 1435      Knowledge Questionnaire Score   Pre Score 26      Core Components/Risk Factors/Patient Goals at Admission:     Personal Goals and Risk Factors at Admission - 06/21/16 1440      Core Components/Risk Factors/Patient  Goals on Admission   Increase Strength and Stamina Yes   Intervention Provide advice, education, support and counseling about physical activity/exercise needs.;Develop an individualized exercise prescription for aerobic and resistive training based on initial evaluation findings, risk stratification, comorbidities and participant's personal goals.   Expected Outcomes Achievement of increased cardiorespiratory fitness and enhanced flexibility, muscular endurance and strength shown through measurements of functional capacity and personal statement of participant.   Hypertension Yes   Intervention Provide education on lifestyle modifcations including regular physical activity/exercise, weight management, moderate sodium restriction and increased consumption of fresh fruit, vegetables, and low fat dairy, alcohol moderation, and smoking cessation.;Monitor prescription use compliance.   Expected Outcomes Short Term: Continued assessment and intervention until BP is < 140/62m HG in hypertensive participants. < 130/839mHG in hypertensive participants with diabetes, heart failure or chronic kidney disease.;Long Term: Maintenance of blood pressure at goal levels.   Lipids Yes   Intervention Provide education and support for participant on nutrition & aerobic/resistive exercise along with prescribed medications to achieve LDL <7017mHDL >84m85m Expected Outcomes Short Term: Participant states understanding of desired cholesterol values and is compliant with medications prescribed. Participant is following exercise prescription and nutrition guidelines.;Long Term: Cholesterol controlled with medications as prescribed, with individualized exercise RX and with personalized nutrition plan. Value goals: LDL < 70mg84mL > 40 mg.      Core Components/Risk Factors/Patient Goals Review:      Goals and Risk Factor Review    Row Name 07/12/16 1721 08/06/16 1409           Core Components/Risk Factors/Patient Goals  Review   Personal Goals Review Weight Management/Obesity;Hypertension;Lipids  -      Review AmandAndrealrying to "behave" with watching her diet more closely.  Her blood pressures have been lower than normal.  She is tolerating her statins without problems. Charlotte Wagner her blood pressure when she first started on teh Brilinta was 100systolic but is better  now at 120/70. Charlotte Wagner said she "displaced her iliac joints so didn't use the treadmill last week but did use the other machines without any problems. Charlotte Wagner reports that her stamina has gotten better since "she pushes her self alot more than if she was exercising on her own. "      Expected Outcomes Charlotte Wagner will continue to come to exercise and education classes.  We will continue to monitor for progression. Cont heart healthy lifestyle         Core Components/Risk Factors/Patient Goals at Discharge (Final Review):      Goals and Risk Factor Review - 08/06/16 1409      Core Components/Risk Factors/Patient Goals Review   Review Charlotte Wagner said her blood pressure when she first started on teh Brilinta was 100systolic but is better now at 120/70. Charlotte Wagner said she "displaced her iliac joints so didn't use the treadmill last week but did use the other machines without any problems. Charlotte Wagner reports that her stamina has gotten better since "she pushes her self alot more than if she was exercising on her own. "   Expected Outcomes Cont heart healthy lifestyle      ITP Comments:     ITP Comments    Row Name 06/21/16 1438 06/21/16 1442 06/28/16 1812 07/18/16 0709 08/15/16 0633   ITP Comments Charlotte Wagner walks with a cane. She uses a rolling walker at homes at times. Clemence reports history that she tripped over something and fell and bruised her leg. 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 Rekisha also doesn't feel that she needs to meet individually with the Cardiac REhab REgisterd Dietician. "I know what to eat" Nastacia reports  that she has a family history of depression. Pa said she started herself on antidepressants after she asked her husband to leave and she was raising 3 children alone plus private MD practice. First full day of exercise!  Patient was oriented to gym and equipment including functions, settings, policies, and procedures.  Patient's individual exercise prescription and treatment plan were reviewed.  All starting workloads were established based on the results of the 6 minute walk test done at initial orientation visit.  The plan for exercise progression was also introduced and progression will be customized based on patient's performance and goals. 30 day review. Continue with ITP unless changes noted by Medical Director at signature of review.  New to program 30 day review completed for Medical Director physician review and signature. Continue ITP unless changes made by physician.      Comments:

## 2016-08-16 ENCOUNTER — Encounter: Payer: Medicare Other | Admitting: *Deleted

## 2016-08-16 DIAGNOSIS — Z9861 Coronary angioplasty status: Secondary | ICD-10-CM

## 2016-08-16 NOTE — Progress Notes (Signed)
Daily Session Note  Patient Details  Name: Charlotte Wagner MRN: [id number] Date of Birth: 07-09-1936 Referring Provider:   Flowsheet Row Cardiac Rehab from 06/21/2016 in [facility] Cardiac and Pulmonary Rehab  Referring Provider  Kathlyn Sacramento MD      Encounter Date: 08/16/2016  Check In:     Session Check In - 08/16/16 1715      Check-In   Location [facility]-Cardiac & Pulmonary Rehab   Staff Present Heath Lark, RN, BSN, Laveda Norman, BS, ACSM CEP, Exercise Physiologist;Carolyn Oletta Darter, [place], ACSM CEP, Exercise Physiologist   Supervising physician immediately available to respond to emergencies See telemetry face sheet for immediately available ER MD   Medication changes reported     No   Fall or balance concerns reported    No   Warm-up and Cool-down Performed on first and last piece of equipment   Resistance Training Performed Yes   VAD Patient? No     Pain Assessment   Currently in Pain? No/denies   Multiple Pain Sites No         Goals Met:  Independence with exercise equipment Exercise tolerated well No report of cardiac concerns or symptoms Strength training completed today  Goals Unmet:  Not Applicable  Comments: Patient tolerated exercise well. She did report that she has noticed some irregular heart beats with no pain or symptoms associated with this. She did show some sporadic irregular beats on her EKG strip during exercise. She was advised to talk with her doctor and she stated that she would call him.    Dr. Emily Filbert is Medical Director for [place] and [facility].

## 2016-08-17 ENCOUNTER — Telehealth: Payer: Self-pay | Admitting: Cardiology

## 2016-08-17 NOTE — Telephone Encounter (Signed)
Fax received from [employer]. This form stated "Brelynn reports sporadic sensation of heart beat skipping. No symptoms just the sensation of not beating regular. I advised her to speak about this as it has happened off and on. Did catch a few ectopics today in class. No symptoms reported." and signed by Heath Lark RN CCRP. This form requires physician to acknowledge receipt and a response. Forms placed in red folder on Denisse Whitenack's desk for Dr. Yvone Neu to review and sign.

## 2016-08-20 ENCOUNTER — Other Ambulatory Visit: Payer: Self-pay

## 2016-08-20 ENCOUNTER — Encounter: Payer: Medicare Other | Admitting: *Deleted

## 2016-08-20 ENCOUNTER — Telehealth: Payer: Self-pay | Admitting: Cardiology

## 2016-08-20 DIAGNOSIS — Z955 Presence of coronary angioplasty implant and graft: Secondary | ICD-10-CM | POA: Diagnosis not present

## 2016-08-20 DIAGNOSIS — Z9861 Coronary angioplasty status: Secondary | ICD-10-CM

## 2016-08-20 DIAGNOSIS — R0602 Shortness of breath: Secondary | ICD-10-CM

## 2016-08-20 NOTE — Telephone Encounter (Signed)
Please call the patient. SOB can be associated with Brilinta, possibly felt to be related adenosine as Brilinta inhibits its clearance, thereby increasing its concentration in circulation. There are no cardiopulmonary adverse effects with this side effect. Some have suggested to take Brilinta with a small amount of diet Coke which closely mimics the caffeine-antagonist theophyline, thereby reducing the amount of adenosine in the circulation which leads to decreased SOB. Ok for 30-day cardiac event monitor to evaluate for increased ventricular ectopy. Unable to tolerate beta blockers given SOB in the past.

## 2016-08-20 NOTE — Progress Notes (Signed)
Daily Session Note  Patient Details  Name: Charlotte Wagner MRN: [id number] Date of Birth: [birth year] Referring Provider:   Flowsheet Row Cardiac Rehab from 06/21/2016 in [facility] Cardiac and Pulmonary Rehab  Referring Provider  Kathlyn Sacramento MD      Encounter Date: 08/20/2016  Check In:     Session Check In - 08/20/16 1745      Check-In   Location [facility]-Cardiac & Pulmonary Rehab   Staff Present Heath Lark, RN, BSN, CCRP;Marques Ericson, RN, Moises Blood, BS, ACSM CEP, Exercise Physiologist   Supervising physician immediately available to respond to emergencies See telemetry face sheet for immediately available ER MD   Medication changes reported     No   Fall or balance concerns reported    No   Warm-up and Cool-down Performed on first and last piece of equipment   Resistance Training Performed Yes   VAD Patient? No     Pain Assessment   Currently in Pain? No/denies         Goals Met:  Proper associated with RPD/PD & O2 Sat Exercise tolerated well  Goals Unmet:  Not Applicable  Comments:     Dr. Emily Filbert is Medical Director for [place] and [facility].

## 2016-08-20 NOTE — Telephone Encounter (Signed)
Pt reports SOB since starting Brilinta Aug 2017 after PCI.  Pt states she was "in and out of bigeminy" Wednesday evening. Denies any sx, oxygen 95%. She reported this at Cardiac Rehab on Thursday. It was documented and sent to Dr. Yvone Neu.   She was felt to be in bigeminy most of the weekend, however, it was during evening hours only. She would like to be considered for an event monitor. Pt is aware Dr. Yvone Neu is out of the office until Nov 15 and would like this to be reviewed by another cardiologist/PA. Forward to [employer], PA-C to further review and advise.

## 2016-08-20 NOTE — Telephone Encounter (Signed)
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  She states she has had more SOB  Has been going  to cardiac rehab started that last week.  Usually only happens at night but now it is going on during the mornings   Has used her Advair in haler and thought that might help but it is not.  Going into Bigeminy she  States on and off  Would like to know what to do Knows if she goes to ED she will be fine there and they would tell her to make an appointment with [place].  Would like to try a heart monitor Would like some advise on this please call back

## 2016-08-20 NOTE — Telephone Encounter (Signed)
Reviewed recommendations w/pt who verbalized understanding. States she is unable to tolerate Diet Coke d/t spasms and increased arrythmias. She is agreeable w/30-day event monitor. Reviewed monitor information w/pt. Order placed.

## 2016-08-22 ENCOUNTER — Telehealth: Payer: Self-pay | Admitting: Cardiology

## 2016-08-22 NOTE — Telephone Encounter (Signed)
Patient states that she goes into bigeminy with any exertion and she has some shortness of breath. She denies any chest pain, denies lightheadedness, and states that it is just breathlessness that she experiences with this. She is very concerned about this and just doesn't know what to do. She stated that she hates to wait 30 days for something to be done. Let her know that I would send this to Dr. Yvone Neu for her review and that we would be in touch. She states that if Dr. Yvone Neu calls her that after 1:00 pm she could be reached on her cell phone at [phone]. She was appreciative for the call and had no further questions at this time.

## 2016-08-22 NOTE — Telephone Encounter (Signed)
Spoke with patient and scheduled her to come in tomorrow morning to see Dr. Yvone Neu at 10:00AM. She was very appreciative for the call and had no further questions at this time.

## 2016-08-22 NOTE — Telephone Encounter (Signed)
Pt states she is not sure she needs a 30 day monitor.please call. States she is getting worse. Pt states she thinks the Brilinta is causing this.

## 2016-08-23 ENCOUNTER — Other Ambulatory Visit (INDEPENDENT_AMBULATORY_CARE_PROVIDER_SITE_OTHER): Payer: Medicare Other

## 2016-08-23 ENCOUNTER — Ambulatory Visit (INDEPENDENT_AMBULATORY_CARE_PROVIDER_SITE_OTHER): Payer: Medicare Other | Admitting: Cardiology

## 2016-08-23 ENCOUNTER — Encounter: Payer: Self-pay | Admitting: *Deleted

## 2016-08-23 ENCOUNTER — Encounter: Payer: Self-pay | Admitting: Cardiology

## 2016-08-23 VITALS — BP 130/68 | HR 70

## 2016-08-23 DIAGNOSIS — E7849 Other hyperlipidemia: Secondary | ICD-10-CM

## 2016-08-23 DIAGNOSIS — R0602 Shortness of breath: Secondary | ICD-10-CM

## 2016-08-23 DIAGNOSIS — I119 Hypertensive heart disease without heart failure: Secondary | ICD-10-CM

## 2016-08-23 DIAGNOSIS — I2510 Atherosclerotic heart disease of native coronary artery without angina pectoris: Secondary | ICD-10-CM

## 2016-08-23 DIAGNOSIS — D72819 Decreased white blood cell count, unspecified: Secondary | ICD-10-CM

## 2016-08-23 DIAGNOSIS — Z9861 Coronary angioplasty status: Secondary | ICD-10-CM

## 2016-08-23 DIAGNOSIS — Z951 Presence of aortocoronary bypass graft: Secondary | ICD-10-CM

## 2016-08-23 DIAGNOSIS — I200 Unstable angina: Secondary | ICD-10-CM

## 2016-08-23 DIAGNOSIS — E784 Other hyperlipidemia: Secondary | ICD-10-CM

## 2016-08-23 DIAGNOSIS — I10 Essential (primary) hypertension: Secondary | ICD-10-CM

## 2016-08-23 DIAGNOSIS — I493 Ventricular premature depolarization: Secondary | ICD-10-CM

## 2016-08-23 LAB — CBC WITH DIFFERENTIAL/PLATELET
BASOS PCT: 0.3 % (ref 0.0–3.0)
Basophils Absolute: 0 10*3/uL (ref 0.0–0.1)
Eosinophils Absolute: 0 10*3/uL (ref 0.0–0.7)
Eosinophils Relative: 1.2 % (ref 0.0–5.0)
HEMATOCRIT: 39.1 % (ref 36.0–46.0)
Hemoglobin: 13.4 g/dL (ref 12.0–15.0)
LYMPHS PCT: 23.6 % (ref 12.0–46.0)
Lymphs Abs: 0.8 10*3/uL (ref 0.7–4.0)
MCHC: 34.4 g/dL (ref 30.0–36.0)
MCV: 99.4 fl (ref 78.0–100.0)
MONOS PCT: 12.9 % — AB (ref 3.0–12.0)
Monocytes Absolute: 0.5 10*3/uL (ref 0.1–1.0)
NEUTROS ABS: 2.2 10*3/uL (ref 1.4–7.7)
Neutrophils Relative %: 62 % (ref 43.0–77.0)
PLATELETS: 245 10*3/uL (ref 150.0–400.0)
RBC: 3.93 Mil/uL (ref 3.87–5.11)
RDW: 13 % (ref 11.5–15.5)
WBC: 3.5 10*3/uL — ABNORMAL LOW (ref 4.0–10.5)

## 2016-08-23 NOTE — Patient Instructions (Addendum)
Medication Instructions:  Your physician has recommended you make the following change in your medication:  1. Increase Bisoprolol (Zebeta) to 5 mg  Testing/Procedures: Your physician has recommended that you wear a holter monitor. Holter monitors are medical devices that record the heart's electrical activity. Doctors most often use these monitors to diagnose arrhythmias. Arrhythmias are problems with the speed or rhythm of the heartbeat. The monitor is a small, portable device. You can wear one while you do your normal daily activities. This is usually used to diagnose what is causing palpitations/syncope (passing out).  30 day event monitor canceled and holter montior ordered. Will send them a message to cancel that order.  [place]  Your caregiver has ordered a Stress Test with nuclear imaging. The purpose of this test is to evaluate the blood supply to your heart muscle. This procedure is referred to as a "Non-Invasive Stress Test." This is because other than having an IV started in your vein, nothing is inserted or "invades" your body. Cardiac stress tests are done to find areas of poor blood flow to the heart by determining the extent of coronary artery disease (CAD). Some patients exercise on a treadmill, which naturally increases the blood flow to your heart, while others who are  unable to walk on a treadmill due to physical limitations have a pharmacologic/chemical stress agent called Lexiscan . This medicine will mimic walking on a treadmill by temporarily increasing your coronary blood flow.   Please note: these test may take anywhere between 2-4 hours to complete  PLEASE REPORT TO [place] AT THE FIRST DESK WILL DIRECT YOU WHERE TO GO  Date of Procedure:_Wednesday August 29, 2016 at 09:00AM_  Arrival Time for Procedure:_Arrive at 08:45AM to register_  Instructions regarding medication:   __X__:  Hold Bisoprolol or Zebeta the night before  procedure and morning of procedure   PLEASE NOTIFY THE OFFICE AT LEAST 24 HOURS IN ADVANCE IF YOU ARE UNABLE TO KEEP YOUR APPOINTMENT.  [phone] AND  PLEASE NOTIFY NUCLEAR MEDICINE AT [facility] AT LEAST 24 HOURS IN ADVANCE IF YOU ARE UNABLE TO KEEP YOUR APPOINTMENT. [phone]  How to prepare for your Myoview test:  1. Do not eat or drink after midnight 2. No caffeine for 24 hours prior to test 3. No smoking 24 hours prior to test. 4. Your medication may be taken with water.  If your doctor stopped a medication because of this test, do not take that medication. 5. Ladies, please do not wear dresses.  Skirts or pants are appropriate. Please wear a short sleeve shirt. 6. No perfume, cologne or lotion. 7. Wear comfortable walking shoes. No heels!  Follow-Up: Your physician recommends that you schedule a follow-up appointment after testing with Dr. Yvone Neu.  It was a pleasure seeing you today here in the office. Please do not hesitate to give [place] a call back if you have any further questions. [place], BSN     Pharmacologic Stress Electrocardiogram A pharmacologic stress electrocardiogram is a heart (cardiac) test that uses nuclear imaging to evaluate the blood supply to your heart. This test may also be called a pharmacologic stress electrocardiography. Pharmacologic means that a medicine is used to increase your heart rate and blood pressure.  This stress test is done to find areas of poor blood flow to the heart by determining the extent of coronary artery disease (CAD). Some people exercise on a treadmill, which naturally increases the blood flow to  the heart. For those people unable to exercise on a treadmill, a medicine is used. This medicine stimulates your heart and will cause your heart to beat harder and more quickly, as if you were exercising.  Pharmacologic stress tests can help determine:  The adequacy of blood flow to your heart during increased levels of  activity in order to clear you for discharge home.  The extent of coronary artery blockage caused by CAD.  Your prognosis if you have suffered a heart attack.  The effectiveness of cardiac procedures done, such as an angioplasty, which can increase the circulation in your coronary arteries.  Causes of chest pain or pressure. LET [facility] CARE PROVIDER KNOW ABOUT:  Any allergies you have.  All medicines you are taking, including vitamins, herbs, eye drops, creams, and over-the-counter medicines.  Previous problems you or members of your family have had with the use of anesthetics.  Any blood disorders you have.  Previous surgeries you have had.  Medical conditions you have.  Possibility of pregnancy, if this applies.  If you are currently breastfeeding. RISKS AND COMPLICATIONS Generally, this is a safe procedure. However, as with any procedure, complications can occur. Possible complications include:  You develop pain or pressure in the following areas:  Chest.  Jaw or neck.  Between your shoulder blades.  Radiating down your left arm.  Headache.  Dizziness or light-headedness.  Shortness of breath.  Increased or irregular heartbeat.  Low blood pressure.  Nausea or vomiting.  Flushing.  Redness going up the arm and slight pain during injection of medicine.  Heart attack (rare). BEFORE THE PROCEDURE   Avoid all forms of caffeine for 24 hours before your test or as directed by your health care provider. This includes coffee, tea (even decaffeinated tea), caffeinated sodas, chocolate, cocoa, and certain pain medicines.  Follow your health care provider's instructions regarding eating and drinking before the test.  Take your medicines as directed at regular times with water unless instructed otherwise. Exceptions may include:  If you have diabetes, ask how you are to take your insulin or pills. It is common to adjust insulin dosing the morning of the  test.  If you are taking beta-blocker medicines, it is important to talk to your health care provider about these medicines well before the date of your test. Taking beta-blocker medicines may interfere with the test. In some cases, these medicines need to be changed or stopped 24 hours or more before the test.  If you wear a nitroglycerin patch, it may need to be removed prior to the test. Ask your health care provider if the patch should be removed before the test.  If you use an inhaler for any breathing condition, bring it with you to the test.  If you are an outpatient, bring a snack so you can eat right after the stress phase of the test.  Do not smoke for 4 hours prior to the test or as directed by your health care provider.  Do not apply lotions, powders, creams, or oils on your chest prior to the test.  Wear comfortable shoes and clothing. Let your health care provider know if you were unable to complete or follow the preparations for your test. PROCEDURE   Multiple patches (electrodes) will be put on your chest. If needed, small areas of your chest may be shaved to get better contact with the electrodes. Once the electrodes are attached to your body, multiple wires will be attached to the electrodes,  and your heart rate will be monitored.  An IV access will be started. A nuclear trace (isotope) is given. The isotope may be given intravenously, or it may be swallowed. Nuclear refers to several types of radioactive isotopes, and the nuclear isotope lights up the arteries so that the nuclear images are clear. The isotope is absorbed by your body. This results in low radiation exposure.  A resting nuclear image is taken to show how your heart functions at rest.  A medicine is given through the IV access.  A second scan is done about 1 hour after the medicine injection and determines how your heart functions under stress.  During this stress phase, you will be connected to an  electrocardiogram machine. Your blood pressure and oxygen levels will be monitored. AFTER THE PROCEDURE   Your heart rate and blood pressure will be monitored after the test.  You may return to your normal schedule, including diet,activities, and medicines, unless your health care provider tells you otherwise. This information is not intended to replace advice given to you by your health care provider. Make sure you discuss any questions you have with your health care provider. Document Released: 02/10/2009 Document Revised: 09/29/2013 Document Reviewed: 06/01/2013 Elsevier Interactive Patient Education  2017 [place].  Holter Monitoring Introduction A Holter monitor is a small device that is used to detect abnormal heart rhythms. It clips to your clothing and is connected by wires to flat, sticky disks (electrodes) that attach to your chest. It is worn continuously for 24-48 hours. Follow these instructions at home:  Wear your Holter monitor at all times, even while exercising and sleeping, for as long as directed by your health care provider.  Make sure that the Holter monitor is safely clipped to your clothing or close to your body as recommended by your health care provider.  Do not get the monitor or wires wet.  Do not put body lotion or moisturizer on your chest.  Keep your skin clean.  Keep a diary of your daily activities, such as walking and doing chores. If you feel that your heartbeat is abnormal or that your heart is fluttering or skipping a beat:  Record what you are doing when it happens.  Record what time of day the symptoms occur.  Return your Holter monitor as directed by your health care provider.  Keep all follow-up visits as directed by your health care provider. This is important. Get help right away if:  You feel lightheaded or you faint.  You have trouble breathing.  You feel pain in your chest, upper arm, or jaw.  You feel sick to your stomach  and your skin is pale, cool, or damp.  You heartbeat feels unusual or abnormal. This information is not intended to replace advice given to you by your health care provider. Make sure you discuss any questions you have with your health care provider. Document Released: 06/22/2004 Document Revised: 03/01/2016 Document Reviewed: 05/03/2014  2017 Elsevier

## 2016-08-23 NOTE — Progress Notes (Signed)
Cardiology Office Note   Date:  08/23/2016   ID:  Charlotte Wagner, DOB 07/22/1936, MRN [id number]  Referring Doctor:  [name], MD   Cardiologist:   Wende Bushy, MD   Reason for consultation:  Chief Complaint  Patient presents with  . other     f/u sooner.Pt called in stating that she goes into bigeminy with any exertion and also has shortness of breath. She previously called and wanted a 30 day monitor and now she states that she doesn't want to wait that long to have something done. Reviewed meds with pt verbally.   hosp ffup after PCI   History of Present Illness: Charlotte Wagner is a 80 y.o. female who presents for Follow-up   Patient has noted in the last couple of weeks that he's been having more extrasystoles, especially with exertion. She is known to have history of paroxysmal SVT and PVCs. She has noted that the extra beats, on after walking or exerting herself, she has also noticed maybe a slight worsening of the shortness of breath. She denies chest pain.  She reminds me that when she presented with symptoms last hospital stay for which she ended up with a heart catheterization and a stent, she presented similarly but that time she had more chest discomfort. This time, there is no chest discomfort but there is shortness of breath. She has not had any loss of consciousness. She does not feel that she has had any runs.   ROS:  Please see the history of present illness. Aside from mentioned under HPI, all other systems are reviewed and negative.     Past Medical History:  Diagnosis Date  . Allergy   . Arthritis    s/p bilateral knees and left hip replacement  . Asthma   . CAD (coronary artery disease)    a. 12/1996 s/p CABG x4 ([place]);  b. 2005 Pt reports stress test & cath, which revealed patent grafts.  . Cancer ([place])    melanoma right arm  . Carotid arterial disease ([place])    a. 04/2015 Carotid U/S: <50% bilat ICA stenosis.  . Chronic kidney disease    . Colon polyps    H/O  . Depression   . GERD (gastroesophageal reflux disease)    h/o hiatal hernia  . Headache    migraines in past  . Heart murmur    a. 04/2011 Echo: EF 55-60%, bilat atrial enlargement, mild to mod TR.  [name] History of chicken pox   . History of hiatal hernia   . Hx of migraines    rare now  . Hx: UTI (urinary tract infection)   . Hyperlipidemia    a. Statin intolerant -->on zetia.  . Hypertensive heart disease   . Lichen planus   . Melanoma ([place])   . Palpitations    a. rare PVC's and h/o SVT.  [name] PMR (polymyalgia rheumatica) ([facility])    h/o in setting of crestor usage.  [name] PSVT (paroxysmal supraventricular tachycardia) ([place])   . PUD (peptic ulcer disease)    remote history  . Raynaud's phenomenon   . Spinal stenosis   . Urine incontinence    H/O  . Vitamin D deficiency     Past Surgical History:  Procedure Laterality Date  . ADENOIDECTOMY     age 60  . BACK SURGERY     L3-L5  . BILATERAL CARPAL TUNNEL RELEASE    . BREAST BIOPSY Right    bx x  3 neg  . BREAST SURGERY Right    biopsy x 3 (all benign)  . CARDIAC CATHETERIZATION N/A 06/01/2016   Procedure: LEFT HEART CATH AND CORS/GRAFTS ANGIOGRAPHY;  Surgeon: Wellington Hampshire, MD;  Location: [facility];  Service: Cardiovascular;  Laterality: N/A;  . CARDIAC CATHETERIZATION N/A 06/01/2016   Procedure: Coronary Stent Intervention;  Surgeon: Wellington Hampshire, MD;  Location: [facility];  Service: Cardiovascular;  Laterality: N/A;  . CATARACT EXTRACTION W/PHACO Right 01/04/2016   Procedure: CATARACT EXTRACTION PHACO AND INTRAOCULAR LENS PLACEMENT (I[facility]);  Surgeon: Leandrew Koyanagi, MD;  Location: [facility];  Service: Ophthalmology;  Laterality: Right;  MALYUGIN  . CATARACT EXTRACTION W/PHACO Left 01/25/2016   Procedure: CATARACT EXTRACTION PHACO AND INTRAOCULAR LENS PLACEMENT ([place]) left eye;  Surgeon: Leandrew Koyanagi, MD;  Location: [facility];  Service: Ophthalmology;   Laterality: Left;  MALYUGIN SHUGARCAINE  . CHOLECYSTECTOMY  90's  . COLONOSCOPY WITH PROPOFOL N/A 05/03/2015   Procedure: COLONOSCOPY WITH PROPOFOL;  Surgeon: Lollie Sails, MD;  Location: [facility] ENDOSCOPY;  Service: Endoscopy;  Laterality: N/A;  . CORONARY ARTERY BYPASS GRAFT  98   4 vessel  . ESOPHAGOGASTRODUODENOSCOPY N/A 03/22/2015   Procedure: ESOPHAGOGASTRODUODENOSCOPY (EGD);  Surgeon: Lollie Sails, MD;  Location: [facility] ENDOSCOPY;  Service: Endoscopy;  Laterality: N/A;  . JOINT REPLACEMENT     BILATERAL KNEE REPLACEMENTS  . KNEE ARTHROSCOPY W/ OATS PROCEDURE     Lt knee (9/01), Rt knee (3/11), Lt hip (5/10)  . TOTAL HIP ARTHROPLASTY       reports that she has quit smoking. She has never used smokeless tobacco. She reports that she does not drink alcohol or use drugs.   family history includes Alcohol abuse in her father; Depression in her daughter, father, maternal grandmother, and son; Heart disease in her father; Hypertension in her maternal grandfather and paternal grandfather; Lung cancer in her sister; Stroke in her maternal grandmother; Thyroid disease in her daughter and mother.   Outpatient Medications Prior to Visit  Medication Sig Dispense Refill  . acetaminophen (TYLENOL) 500 MG tablet Take 1,000 mg by mouth 2 (two) times daily.     [name] amitriptyline (ELAVIL) 25 MG tablet Take 25 mg by mouth at bedtime.    [name] b complex vitamins tablet Take 1 tablet by mouth daily.    . bisoprolol (ZEBETA) 5 MG tablet Take 2.5 mg by mouth at bedtime.    . Cholecalciferol (VITAMIN D3) 2000 UNITS TABS Take 1 capsule by mouth daily.     . Coenzyme Q10 (COQ-10) 100 MG CAPS Take 1 tablet by mouth daily.    [name] ezetimibe (ZETIA) 10 MG tablet Take 10 mg by mouth at bedtime.    . folic acid (FOLVITE) 1 MG tablet take 1 tablet by mouth once daily 30 tablet 5  . hydrocortisone 2.5 % cream apply to VULVA twice a day AT 10AM AND 4 PM 28 g 0  . ketoconazole (NIZORAL) 2 % cream Apply 1 application  topically daily. (Patient taking differently: Apply 1 application topically daily as needed for irritation. ) 30 g 0  . loratadine (CLARITIN) 10 MG tablet Take 10 mg by mouth daily.    . meclizine (ANTIVERT) 25 MG tablet Take 25 mg by mouth 3 (three) times daily as needed for dizziness.    . miconazole (MICOTIN) 2 % cream Apply 1 application topically 2 (two) times daily as needed (irritation).    . Multiple Vitamins-Minerals (HAIR/SKIN/NAILS PO) Take 1 tablet by  mouth daily.    . nitroGLYCERIN (NITROSTAT) 0.4 MG SL tablet Place 1 tablet (0.4 mg total) under the tongue every 5 (five) minutes as needed for chest pain. Place 1 tablet (0.4 mg total) under the tongue every 5 (five) minutes , 3 times as needed for chest pain. 15 tablet 0  . Omega-3 Fatty Acids (FISH OIL PO) Take 2,000 mg by mouth daily.    . pantoprazole (PROTONIX) 40 MG tablet take 1 tablet by mouth twice a day 60 tablet 5  . Polyethylene Glycol 3350 (MIRALAX PO) Take 8.5 g by mouth at bedtime.     . ramipril (ALTACE) 10 MG capsule take 1 capsule by mouth once daily 30 capsule 3  . spironolactone (ALDACTONE) 25 MG tablet take 1 tablet by mouth every morning 30 tablet 5  . sucralfate (CARAFATE) 1 g tablet Take 1 g by mouth 2 (two) times daily as needed (acid reflux).    . ticagrelor (BRILINTA) 90 MG TABS tablet Take 1 tablet (90 mg total) by mouth 2 (two) times daily. 60 tablet 2  . traMADol (ULTRAM) 50 MG tablet Take 1 tablet (50 mg total) by mouth daily as needed. (Patient taking differently: Take 50 mg by mouth daily as needed for moderate pain. ) 20 tablet 0  . traZODone (DESYREL) 150 MG tablet take 1 and 2/3 tablets to 2 tablets by mouth at bedtime as directed 60 tablet 2  . verapamil (CALAN-SR) 180 MG CR tablet take 1 tablet by mouth twice a day 60 tablet 5  . vitamin B-12 (CYANOCOBALAMIN) 1000 MCG tablet Take 1,000 mcg by mouth daily.    . vitamin C (ASCORBIC ACID) 500 MG tablet Take 500 mg by mouth at bedtime.    . ramipril  (ALTACE) 10 MG capsule Take 10 mg by mouth at bedtime.    . traZODone (DESYREL) 150 MG tablet Take 275 mg by mouth at bedtime. Patient takes 1 and 2/3 tablets      No facility-administered medications prior to visit.      Allergies: Beta adrenergic blockers; Albuterol; Aspirin; Penicillins; Statins; Sulfa antibiotics; Tetracycline; and Tetracyclines & related    PHYSICAL EXAM: VS:  BP 130/68 (BP Location: Left Arm, Patient Position: Sitting, Cuff Size: Normal)   Pulse 70  , There is no height or weight on file to calculate BMI. Wt Readings from Last 3 Encounters:  07/11/16 183 lb 6.4 oz (83.2 kg)  06/21/16 184 lb 8 oz (83.7 kg)  06/14/16 185 lb 9.6 oz (84.2 kg)    GENERAL:  well developed, well nourished, obese, not in acute distress HEENT: normocephalic, pink conjunctivae, anicteric sclerae, no xanthelasma, normal dentition, oropharynx clear NECK:  no neck vein engorgement, JVP normal, no hepatojugular reflux, carotid upstroke brisk and symmetric, no bruit, no thyromegaly, no lymphadenopathy LUNGS:  good respiratory effort, clear to auscultation bilaterally CV:  PMI not displaced, no thrills, no lifts, S1 and S2 within normal limits, no palpable S3 or S4, no murmurs, no rubs, no gallops ABD:  Soft, nontender, nondistended, normoactive bowel sounds, no abdominal aortic bruit, no hepatomegaly, no splenomegaly, bruise noted on the left lower quadrant MS: nontender back, no kyphosis, no scoliosis, no joint deformities EXT:  2+ DP/PT pulses, no edema, no varicosities, no cyanosis, no clubbing. RFA is intact, no bruit. Extensive bruising but no hematoma on the right femoral artery access site --this has since resolved compared to last visit's PE SKIN: warm, nondiaphoretic, normal turgor, no ulcers NEUROPSYCH: alert, oriented to person, place, and  time, sensory/motor grossly intact, normal mood, appropriate affect  Recent Labs: 09/12/2015: TSH 1.54 03/26/2016: ALT 18 06/12/2016: BUN 24;  Creatinine, Ser 0.95; Potassium 4.3; Sodium 134 08/23/2016: Hemoglobin 13.4; Platelets 245.0   Lipid Panel    Component Value Date/Time   CHOL 221 (H) 03/26/2016 0932   TRIG 119.0 03/26/2016 0932   HDL 54.80 03/26/2016 0932   CHOLHDL 4 03/26/2016 0932   VLDL 23.8 03/26/2016 0932   LDLCALC 143 (H) 03/26/2016 0932   LDLDIRECT 126.7 07/03/2013 0937     Other studies Reviewed:  EKG:  The ekg from 06/12/2016 was personally reviewed by me and it revealed sinus rhythm, 67 BPM. Low-voltage QRS.  EKG from 08/23/2016 was personally reviewed by me and it showed sinus rhythm, 70 BPM, frequent PVCs and bigeminy. Nonspecific ST-T wave changes.  Additional studies/ records that were reviewed personally reviewed by me today include:  Left heart cath 06/01/2016:  Prox LAD lesion, 100 %stenosed.  Ost Cx to Prox Cx lesion, 50 %stenosed.  Ost LM to LM lesion, 70 %stenosed.  Mid RCA lesion, 100 %stenosed.  Prox RCA lesion, 60 %stenosed.  SVG graft was visualized by angiography and is normal in caliber.  Post Atrio lesion, 60 %stenosed.  SVG graft was visualized by angiography and is normal in caliber.  The graft exhibits mild .  The flow in the graft is reversed.  Origin lesion, 30 %stenosed.  SVG graft was visualized by angiography.  Prox Graft lesion, 30 %stenosed.  LIMA graft was visualized by angiography and is normal in caliber and anatomically normal.  The left ventricular systolic function is normal.  LV end diastolic pressure is mildly elevated.  The left ventricular ejection fraction is 55-65% by visual estimate.  A STENT XIENCE ALPINE RX 3.0X15 drug eluting stent was successfully placed, and does not overlap previously placed stent.  Prox Graft lesion, 90 %stenosed.  Post intervention, there is a 0% residual stenosis.   1. Severe underlying three-vessel coronary artery disease with patent grafts including LIMA to LAD, SVG to diagonal, SVG to left circumflex and  SVG to RCA. Severe 90% stenosis and SVG to RCA is likely the culprit for unstable angina.  2. Normal LV systolic function and mildly elevated left ventricular end-diastolic pressure.  3. Successful drug-eluting stent placement to the SVG to RCA using a distal protection device.  Recommendations: The patient is severely intolerant to aspirin and she reports inability to take the medication. Thus, I elected to treat her with Brilinta monotherapy. Continue aggressive treatment for coronary artery disease.  Carotid u/s 04/25/2015, ordered by PCP: Less than 50% stenosis bilateral ICA Severe left ECA disease  Echo 06/19/2016: Left ventricle: The cavity size was normal. Systolic function was   normal. The estimated ejection fraction was in the range of 60%   to 65%. Wall motion was normal; there were no regional wall   motion abnormalities. Left ventricular diastolic function   parameters were normal. - Mitral valve: There was mild regurgitation. - Left atrium: The atrium was at the upper limits of normal in   size. - Right ventricle: Systolic function was normal. - Pulmonary arteries: Systolic pressure was mildly elevated. PA   peak pressure: 40 mm Hg (S).  ASSESSMENT AND PLAN: PVCs, ventricular bigeminy Palpitations/PSVT/PVC's Recent onset, with exertion Presented similarly but with chest pain the last hospital visit ended up with a stent Recommend to do a trial of increasing Zebeta to 5mg  po qd, one to her blood pressure Recommend to set her up  for pharmacologic nuclear stress test Recommend 24-hour Holter monitor  CAD CAD s/p CABG x 4 in 1998 in [place], [place].  Last cardiac eval took place in 2005 with stress testing and cath, apparently revealing 4/4 patent grafts. S/P LHCath and DES to SVG to RCA 06/01/2016. Other grafts patent Cont ?blocker - she is very sensitive to this. She had tried bid dosing and developed 1st degree avb. She is tolerating current dose of Zebeta 2.5mg   qd. We need to do trial with a higher dose of severity that as discussed above.  She did not tolerate Tenormin and metoprolol in the past - lowered blood pressure too much. Cont acei.  Resume home dose of zetia (statin intolerant).  She is allergic to ASA  - significant GI side effects. Developed significant bruising to Plavix.  We discussed importance of staying on [place] for 1 year ideally at least to be transitioned to lifelong antiplatelet (will be rediscussed in future visits). Hold off on cardiac rehabilitation for now until after stress test Her symptom of dyspnea may be still related to the [address]. She has a history of significant side effects to different medications.  Hypertension  Continue monitor blood pressure. If patient notices that her blood pressure remains or continues to drop further, she should call our office.   Hyperlipidemia Statin intolerant. Resume zetia. Patient developed significant muscle weakness and intolerance to rosuvastatin, atorvastatin, simvastatin, and pravastatin.   carotid artery disease  Pt followed by PCP.  Current medicines are reviewed at length with the patient today.  The patient does not have concerns regarding medicines.  Labs/ tests ordered today include:  Orders Placed This Encounter  Procedures  . NM Myocar Multi W/Spect W/Wall Motion / EF  . Holter monitor - 24 hour  . EKG 12-Lead    I had a lengthy and detailed discussion with the patient regarding diagnoses, prognosis, diagnostic options, treatment options , and side effects of medications.   I counseled the patient on importance of lifestyle modification including heart healthy diet, regular physical activity .   Disposition:   FU with undersigned After tests  I spent at least 40 minutes with the patient today and more than 50% of the time was spent counseling the patient and coordinating care.     Signed, Wende Bushy, MD  08/23/2016 2:40 PM    [place]  This note was generated in part with voice recognition software and I apologize for any typographical errors that were not detected and corrected.

## 2016-08-24 ENCOUNTER — Other Ambulatory Visit: Payer: Self-pay | Admitting: Internal Medicine

## 2016-08-24 DIAGNOSIS — D72819 Decreased white blood cell count, unspecified: Secondary | ICD-10-CM

## 2016-08-24 NOTE — Progress Notes (Signed)
Order placed for f/u [facility].   

## 2016-08-28 ENCOUNTER — Ambulatory Visit (INDEPENDENT_AMBULATORY_CARE_PROVIDER_SITE_OTHER): Payer: Medicare Other

## 2016-08-28 DIAGNOSIS — R0602 Shortness of breath: Secondary | ICD-10-CM

## 2016-08-28 DIAGNOSIS — Z961 Presence of intraocular lens: Secondary | ICD-10-CM | POA: Diagnosis not present

## 2016-08-28 DIAGNOSIS — I119 Hypertensive heart disease without heart failure: Secondary | ICD-10-CM

## 2016-08-28 DIAGNOSIS — I2510 Atherosclerotic heart disease of native coronary artery without angina pectoris: Secondary | ICD-10-CM

## 2016-08-28 NOTE — Telephone Encounter (Signed)
Thanks for the response and just let us know if you have any further problems.

## 2016-08-28 NOTE — Telephone Encounter (Signed)
I can send note, but the ekg and other pages would still have to be faxed as they are not a part of the EPIC EMR. Charlotte Wagner

## 2016-08-29 ENCOUNTER — Ambulatory Visit
Admission: RE | Admit: 2016-08-29 | Discharge: 2016-08-29 | Disposition: A | Discharge status: Home / Self Care | Payer: Medicare Other | Source: Ambulatory Visit | Attending: Cardiology | Admitting: Cardiology

## 2016-08-29 ENCOUNTER — Other Ambulatory Visit: Payer: Self-pay | Admitting: Internal Medicine

## 2016-08-29 DIAGNOSIS — I119 Hypertensive heart disease without heart failure: Secondary | ICD-10-CM | POA: Diagnosis not present

## 2016-08-29 DIAGNOSIS — R0602 Shortness of breath: Secondary | ICD-10-CM | POA: Insufficient documentation

## 2016-08-29 DIAGNOSIS — I2510 Atherosclerotic heart disease of native coronary artery without angina pectoris: Secondary | ICD-10-CM | POA: Insufficient documentation

## 2016-08-29 LAB — NM MYOCAR MULTI W/SPECT W/WALL MOTION / EF
CHL CUP NUCLEAR SDS: 1
CHL CUP NUCLEAR SRS: 1
LVDIAVOL: 76 mL (ref 46–106)
LVSYSVOL: 24 mL
NUC STRESS TID: 0.95
Rest HR: 62 {beats}/min
SSS: 7

## 2016-08-29 MED ORDER — TECHNETIUM TC 99M TETROFOSMIN IV KIT
12.9310 | PACK | Freq: Once | INTRAVENOUS | Status: AC | PRN
  Administered 2016-08-29: 12.931 via INTRAVENOUS

## 2016-08-29 MED ORDER — REGADENOSON 0.4 MG/5ML IV SOLN
0.4000 mg | Freq: Once | INTRAVENOUS | Status: AC
  Administered 2016-08-29: 0.4 mg via INTRAVENOUS

## 2016-08-29 MED ORDER — TECHNETIUM TC 99M TETROFOSMIN IV KIT
33.0000 | PACK | Freq: Once | INTRAVENOUS | Status: AC | PRN
  Administered 2016-08-29: 29.772 via INTRAVENOUS

## 2016-08-31 ENCOUNTER — Other Ambulatory Visit: Payer: Self-pay | Admitting: Cardiology

## 2016-09-01 ENCOUNTER — Other Ambulatory Visit: Payer: Self-pay | Admitting: Internal Medicine

## 2016-09-03 ENCOUNTER — Other Ambulatory Visit: Payer: Self-pay

## 2016-09-03 ENCOUNTER — Other Ambulatory Visit: Payer: Self-pay | Admitting: Cardiology

## 2016-09-03 MED ORDER — TICAGRELOR 90 MG PO TABS: 90.0000 mg | ORAL_TABLET | Freq: Two times a day (BID) | ORAL | 3 refills | Status: DC

## 2016-09-03 NOTE — Telephone Encounter (Signed)
*  STAT* If patient is at the pharmacy, call can be transferred to refill team.   1. Which medications need to be refilled? (please list name of each medication and dose if known) bisoprolol (ZEBETA) 5 MG tablet  2. Which pharmacy/location (including street and city if local pharmacy) is medication to be sent to? [facility]   3. Do they need a 30 day or 90 day supply? 30 day

## 2016-09-04 ENCOUNTER — Other Ambulatory Visit: Payer: Self-pay | Admitting: *Deleted

## 2016-09-04 ENCOUNTER — Other Ambulatory Visit: Payer: Self-pay

## 2016-09-04 MED ORDER — BISOPROLOL FUMARATE 5 MG PO TABS: 5.0000 mg | ORAL_TABLET | Freq: Every day | ORAL | 3 refills | Status: DC

## 2016-09-04 NOTE — Telephone Encounter (Signed)
Requested Prescriptions   Signed Prescriptions Disp Refills  . bisoprolol (ZEBETA) 5 MG tablet 30 tablet 3    Sig: Take 1 tablet (5 mg total) by mouth at bedtime.    Authorizing Provider: Wende Bushy    Ordering User: Britt Bottom

## 2016-09-05 ENCOUNTER — Other Ambulatory Visit: Payer: Self-pay | Admitting: Internal Medicine

## 2016-09-06 ENCOUNTER — Encounter: Payer: Self-pay | Admitting: Internal Medicine

## 2016-09-06 ENCOUNTER — Other Ambulatory Visit: Payer: Self-pay

## 2016-09-06 ENCOUNTER — Ambulatory Visit (INDEPENDENT_AMBULATORY_CARE_PROVIDER_SITE_OTHER): Payer: Medicare Other | Admitting: Internal Medicine

## 2016-09-06 ENCOUNTER — Ambulatory Visit
Admission: RE | Admit: 2016-09-06 | Discharge: 2016-09-06 | Disposition: A | Discharge status: Home / Self Care | Payer: Medicare Other | Source: Ambulatory Visit | Attending: Cardiology | Admitting: Cardiology

## 2016-09-06 DIAGNOSIS — R002 Palpitations: Secondary | ICD-10-CM | POA: Diagnosis not present

## 2016-09-06 DIAGNOSIS — E559 Vitamin D deficiency, unspecified: Secondary | ICD-10-CM | POA: Diagnosis not present

## 2016-09-06 DIAGNOSIS — I2510 Atherosclerotic heart disease of native coronary artery without angina pectoris: Secondary | ICD-10-CM | POA: Insufficient documentation

## 2016-09-06 DIAGNOSIS — K2100 Gastro-esophageal reflux disease with esophagitis, without bleeding: Secondary | ICD-10-CM

## 2016-09-06 DIAGNOSIS — M4800 Spinal stenosis, site unspecified: Secondary | ICD-10-CM

## 2016-09-06 DIAGNOSIS — I10 Essential (primary) hypertension: Secondary | ICD-10-CM

## 2016-09-06 DIAGNOSIS — I200 Unstable angina: Secondary | ICD-10-CM | POA: Diagnosis not present

## 2016-09-06 DIAGNOSIS — E7800 Pure hypercholesterolemia, unspecified: Secondary | ICD-10-CM

## 2016-09-06 DIAGNOSIS — Z0000 Encounter for general adult medical examination without abnormal findings: Secondary | ICD-10-CM

## 2016-09-06 DIAGNOSIS — R0602 Shortness of breath: Secondary | ICD-10-CM | POA: Diagnosis not present

## 2016-09-06 MED ORDER — AMITRIPTYLINE HCL 25 MG PO TABS: 25.0000 mg | ORAL_TABLET | Freq: Every day | ORAL | 5 refills | Status: DC

## 2016-09-06 NOTE — Progress Notes (Signed)
Pre visit review using our clinic review tool, if applicable. No additional management support is needed unless otherwise documented below in the visit note. 

## 2016-09-06 NOTE — Progress Notes (Signed)
Patient ID: Charlotte Wagner, female   DOB: Mar 16, 1936, 80 y.o.   MRN: [id number]   Subjective:    Patient ID: Charlotte Wagner, female    DOB: 06-07-1936, 80 y.o.   MRN: [id number]  HPI  Patient with past history of CAD with recent hospitalization s/p stent placement.  Seeing cardiology.  Just evaluated 08/23/16.  She had been having more extrasystoles, especially with exertion.  Notices more after walking or exerting herself.  Denied chest pain.  Recommended continuing low dose beta blocker and ace inhibitor.  Recommended continuing on brilinta and holding on cardiac rehab until can get above sorted through.  Recommended stress test and holter.  Has not heard back from holter.  Unable to do a lot of activity secondary to above.  Does not have as much energy.  Eating.  No nausea or vomiting.  No abdominal pain or cramping.  No increased cough or congestion.    Past Medical History:  Diagnosis Date  . Allergy   . Arthritis    s/p bilateral knees and left hip replacement  . Asthma   . CAD (coronary artery disease)    a. 12/1996 s/p CABG x4 ([place]);  b. 2005 Pt reports stress test & cath, which revealed patent grafts.  . Cancer ([place])    melanoma right arm  . Carotid arterial disease ([place])    a. 04/2015 Carotid U/S: <50% bilat ICA stenosis.  . Chronic kidney disease   . Colon polyps    H/O  . Depression   . GERD (gastroesophageal reflux disease)    h/o hiatal hernia  . Headache    migraines in past  . Heart murmur    a. 04/2011 Echo: EF 55-60%, bilat atrial enlargement, mild to mod TR.  [name] History of chicken pox   . History of hiatal hernia   . Hx of migraines    rare now  . Hx: UTI (urinary tract infection)   . Hyperlipidemia    a. Statin intolerant -->on zetia.  . Hypertensive heart disease   . Lichen planus   . Melanoma ([place])   . Palpitations    a. rare PVC's and h/o SVT.  [name] PMR (polymyalgia rheumatica) ([facility])    h/o in setting of crestor usage.  [name] PSVT (paroxysmal  supraventricular tachycardia) ([place])   . PUD (peptic ulcer disease)    remote history  . Raynaud's phenomenon   . Spinal stenosis   . Urine incontinence    H/O  . Vitamin D deficiency    Past Surgical History:  Procedure Laterality Date  . ADENOIDECTOMY     age 34  . BACK SURGERY     L3-L5  . BILATERAL CARPAL TUNNEL RELEASE    . BREAST BIOPSY Right    bx x 3 neg  . BREAST SURGERY Right    biopsy x 3 (all benign)  . CARDIAC CATHETERIZATION N/A 06/01/2016   Procedure: LEFT HEART CATH AND CORS/GRAFTS ANGIOGRAPHY;  Surgeon: Wellington Hampshire, MD;  Location: [facility];  Service: Cardiovascular;  Laterality: N/A;  . CARDIAC CATHETERIZATION N/A 06/01/2016   Procedure: Coronary Stent Intervention;  Surgeon: Wellington Hampshire, MD;  Location: [facility];  Service: Cardiovascular;  Laterality: N/A;  . CATARACT EXTRACTION W/PHACO Right 01/04/2016   Procedure: CATARACT EXTRACTION PHACO AND INTRAOCULAR LENS PLACEMENT (IOC);  Surgeon: Leandrew Koyanagi, MD;  Location: [facility];  Service: Ophthalmology;  Laterality: Right;  MALYUGIN  . CATARACT EXTRACTION W/PHACO Left 01/25/2016  Procedure: CATARACT EXTRACTION PHACO AND INTRAOCULAR LENS PLACEMENT ([place]) left eye;  Surgeon: Leandrew Koyanagi, MD;  Location: [facility];  Service: Ophthalmology;  Laterality: Left;  MALYUGIN SHUGARCAINE  . CHOLECYSTECTOMY  90's  . COLONOSCOPY WITH PROPOFOL N/A 05/03/2015   Procedure: COLONOSCOPY WITH PROPOFOL;  Surgeon: Lollie Sails, MD;  Location[facility];  Service: Endoscopy;  Laterality: N/A;  . CORONARY ARTERY BYPASS GRAFT  98   4 vessel  . ESOPHAGOGASTRODUODENOSCOPY N/A 03/22/2015   Procedure: ESOPHAGOGASTRODUODENOSCOPY (EGD);  Surgeon: Lollie Sails, MD;  Location[facility];  Service: Endoscopy;  Laterality: N/A;  . JOINT REPLACEMENT     BILATERAL KNEE REPLACEMENTS  . KNEE ARTHROSCOPY W/ OATS PROCEDURE     Lt knee (9/01), Rt knee (3/11), Lt hip (5/10)    . TOTAL HIP ARTHROPLASTY     Family History  Problem Relation Age of Onset  . Thyroid disease Mother     graves disease  . Heart disease Father     rheumatic heart  . Alcohol abuse Father   . Depression Father   . Lung cancer Sister   . Depression Daughter   . Thyroid disease Daughter     hashimoto  . Depression Son   . Stroke Maternal Grandmother   . Depression Maternal Grandmother   . Hypertension Maternal Grandfather   . Hypertension Paternal Grandfather   . Breast cancer Neg Hx    Social History   Social History  . Marital status: Married    Spouse name: N/A  . Number of children: 3  . Years of education: N/A   Occupational History  . Retired Sport and exercise psychologist    Social History Main Topics  . Smoking status: Former Research scientist (life sciences)  . Smokeless tobacco: Never Used     Comment: quit 44+ yrs ago  . Alcohol use No  . Drug use: No  . Sexual activity: No   Other Topics Concern  . None   Social History Narrative   Lives in [place].  Retired Engineer, drilling.  Relatively active.    Outpatient Encounter Prescriptions as of 09/06/2016  Medication Sig  . acetaminophen (TYLENOL) 500 MG tablet Take 1,000 mg by mouth 2 (two) times daily.   [name] amitriptyline (ELAVIL) 25 MG tablet Take 1-2 tablets (25-50 mg total) by mouth at bedtime.  [name] b complex vitamins tablet Take 1 tablet by mouth daily.  . bisoprolol (ZEBETA) 5 MG tablet Take 1 tablet (5 mg total) by mouth at bedtime. (Patient taking differently: Take 2.5 mg by mouth at bedtime. )  . BRILINTA 90 MG TABS tablet take 1 tablet by mouth twice a day  . Cholecalciferol (VITAMIN D3) 2000 UNITS TABS Take 1 capsule by mouth daily.   . Coenzyme Q10 (COQ-10) 100 MG CAPS Take 1 tablet by mouth daily.  [name] ezetimibe (ZETIA) 10 MG tablet take 1 tablet by mouth once daily  . folic acid (FOLVITE) 1 MG tablet take 1 tablet by mouth once daily  . hydrocortisone 2.5 % cream apply to VULVA twice a day AT 10AM AND 4 PM  . ketoconazole (NIZORAL) 2 %  cream Apply 1 application topically daily. (Patient taking differently: Apply 1 application topically daily as needed for irritation. )  . loratadine (CLARITIN) 10 MG tablet Take 10 mg by mouth daily.  . meclizine (ANTIVERT) 25 MG tablet Take 25 mg by mouth 3 (three) times daily as needed for dizziness.  . miconazole (MICOTIN) 2 % cream Apply 1 application topically 2 (two) times daily  as needed (irritation).  . Multiple Vitamins-Minerals (HAIR/SKIN/NAILS PO) Take 1 tablet by mouth daily.  . nitroGLYCERIN (NITROSTAT) 0.4 MG SL tablet Place 1 tablet (0.4 mg total) under the tongue every 5 (five) minutes as needed for chest pain. Place 1 tablet (0.4 mg total) under the tongue every 5 (five) minutes , 3 times as needed for chest pain.  . Omega-3 Fatty Acids (FISH OIL PO) Take 2,000 mg by mouth daily.  . pantoprazole (PROTONIX) 40 MG tablet take 1 tablet by mouth twice a day  . Polyethylene Glycol 3350 (MIRALAX PO) Take 8.5 g by mouth at bedtime.   . ramipril (ALTACE) 10 MG capsule take 1 capsule by mouth once daily  . spironolactone (ALDACTONE) 25 MG tablet take 1 tablet by mouth every morning  . sucralfate (CARAFATE) 1 g tablet Take 1 g by mouth 2 (two) times daily as needed (acid reflux).  . ticagrelor (BRILINTA) 90 MG TABS tablet Take 1 tablet (90 mg total) by mouth 2 (two) times daily.  . traMADol (ULTRAM) 50 MG tablet Take 1 tablet (50 mg total) by mouth daily as needed. (Patient taking differently: Take 50 mg by mouth daily as needed for moderate pain. )  . traZODone (DESYREL) 150 MG tablet take 1 and 2/3 tablets to 2 tablets by mouth at bedtime as directed  . verapamil (CALAN-SR) 180 MG CR tablet take 1 tablet by mouth twice a day  . vitamin B-12 (CYANOCOBALAMIN) 1000 MCG tablet Take 1,000 mcg by mouth daily.  . vitamin C (ASCORBIC ACID) 500 MG tablet Take 500 mg by mouth at bedtime.  . [DISCONTINUED] amitriptyline (ELAVIL) 25 MG tablet Take 25 mg by mouth at bedtime.  . [DISCONTINUED]  ezetimibe (ZETIA) 10 MG tablet Take 10 mg by mouth at bedtime.   No facility-administered encounter medications on file as of 09/06/2016.     Review of Systems  Constitutional: Positive for fatigue. Negative for appetite change and unexpected weight change.  HENT: Negative for congestion and sinus pressure.   Eyes: Negative for pain and visual disturbance.  Respiratory: Positive for shortness of breath. Negative for cough and chest tightness.   Cardiovascular: Negative for chest pain and leg swelling.       Increased heart rate and palpitations as outlined.    Gastrointestinal: Negative for abdominal pain, diarrhea, nausea and vomiting.  Genitourinary: Negative for difficulty urinating and dysuria.  Musculoskeletal: Positive for back pain. Negative for joint swelling.  Skin: Negative for color change and rash.  Neurological: Negative for dizziness and headaches.  Hematological: Negative for adenopathy. Does not bruise/bleed easily.  Psychiatric/Behavioral: Negative for agitation and dysphoric mood.       Objective:     Blood pressure rechecked by me:  128/72  Physical Exam  Constitutional: She is oriented to person, place, and time. She appears well-developed and well-nourished. No distress.  HENT:  Nose: Nose normal.  Mouth/Throat: Oropharynx is clear and moist.  Eyes: Right eye exhibits no discharge. Left eye exhibits no discharge. No scleral icterus.  Neck: Neck supple. No thyromegaly present.  Cardiovascular: Normal rate and regular rhythm.   Pulmonary/Chest: Breath sounds normal. No accessory muscle usage. No tachypnea. No respiratory distress. She has no decreased breath sounds. She has no wheezes. She has no rhonchi. Right breast exhibits no inverted nipple, no mass, no nipple discharge and no tenderness (no axillary adenopathy). Left breast exhibits no inverted nipple, no mass, no nipple discharge and no tenderness (no axilarry adenopathy).  Abdominal: Soft. Bowel sounds  are normal. There is no tenderness.  Musculoskeletal: She exhibits no edema or tenderness.  Lymphadenopathy:    She has no cervical adenopathy.  Neurological: She is alert and oriented to person, place, and time.  Skin: Skin is warm. No rash noted. No erythema.  Psychiatric: She has a normal mood and affect. Her behavior is normal.    BP 128/72   Pulse (!) 54   Temp 97.4 F (36.3 C) (Oral)   Ht 5\' 4"  (1.626 m)   Wt 186 lb 9.6 oz (84.6 kg)   SpO2 97%   BMI 32.03 kg/m  Wt Readings from Last 3 Encounters:  09/06/16 186 lb 9.6 oz (84.6 kg)  07/11/16 183 lb 6.4 oz (83.2 kg)  06/21/16 184 lb 8 oz (83.7 kg)     Lab Results  Component Value Date   WBC 3.5 (L) 08/23/2016   HGB 13.4 08/23/2016   HCT 39.1 08/23/2016   PLT 245.0 08/23/2016   GLUCOSE 101 (H) 06/12/2016   CHOL 221 (H) 03/26/2016   TRIG 119.0 03/26/2016   HDL 54.80 03/26/2016   LDLDIRECT 126.7 07/03/2013   LDLCALC 143 (H) 03/26/2016   ALT 18 03/26/2016   AST 20 03/26/2016   NA 134 (L) 06/12/2016   K 4.3 06/12/2016   CL 103 06/12/2016   CREATININE 0.95 06/12/2016   BUN 24 (H) 06/12/2016   CO2 26 06/12/2016   TSH 1.54 09/12/2015       Assessment & Plan:   Problem List Items Addressed This Visit    CAD (coronary artery disease)    Hospitalized 05/31/16 with chest pain.  Cardiac cath revealed 90% occlusion to SVG graft to RCA.  S/p stent placement.  On brilinta.  Having sob and palpitations as outlined.  Seeing cardiology.  W/up in progress.        Essential hypertension, benign    Blood pressure under good control.  Continue same medication regimen.  Follow pressures.  Follow metabolic panel.        GERD (gastroesophageal reflux disease)    On protonix.  Stable.       Health care maintenance    Physical today 09/06/16.  Colonoscopy 05/03/15 with two 2-53mm polyps hepatic flexure and one 84mm polyp (sigmoid) and non bleeding inernal hemorrhoids.  Mammogram 03/06/16 - Birads I.       Hypercholesterolemia     Unable to take statin medications.  On zetia.  Follow lipid panel.        Palpitations    Has a history of PVCs and PACs as outlined.  Symptoms as outlined.  Seeing cardiology. W/up in progress.  Awaiting results.  Follow.        SOB (shortness of breath)    SOB with exertion as outlined.  On Brilinta.  Relates a lot of her symptoms to the Brilinta.  S/p stent placement.  Seeing cardiology.  W/up in progress.  Plans to f/u with testing as outlined.        Spinal stenosis    Has chronic back pain.  Walks with a Programmer, multimedia.  Has desired no further intervention.  Follow.  Also limits her activity.        Vitamin D deficiency    Continue vitamin D supplements.           [name], MD

## 2016-09-07 ENCOUNTER — Telehealth: Payer: Self-pay

## 2016-09-07 NOTE — Telephone Encounter (Signed)
LMOM

## 2016-09-09 ENCOUNTER — Encounter: Payer: Self-pay | Admitting: Internal Medicine

## 2016-09-09 ENCOUNTER — Other Ambulatory Visit: Payer: Self-pay | Admitting: Internal Medicine

## 2016-09-09 NOTE — Assessment & Plan Note (Signed)
Continue vitamin D supplements.  

## 2016-09-09 NOTE — Assessment & Plan Note (Signed)
SOB with exertion as outlined.  On Brilinta.  Relates a lot of her symptoms to the Brilinta.  S/p stent placement.  Seeing cardiology.  W/up in progress.  Plans to f/u with testing as outlined.

## 2016-09-09 NOTE — Assessment & Plan Note (Signed)
Hospitalized 05/31/16 with chest pain.  Cardiac cath revealed 90% occlusion to SVG graft to RCA.  S/p stent placement.  On brilinta.  Having sob and palpitations as outlined.  Seeing cardiology.  W/up in progress.

## 2016-09-09 NOTE — Assessment & Plan Note (Signed)
Blood pressure under good control.  Continue same medication regimen.  Follow pressures.  Follow metabolic panel.   

## 2016-09-09 NOTE — Assessment & Plan Note (Signed)
Physical today 09/06/16.  Colonoscopy 05/03/15 with two 2-68mm polyps hepatic flexure and one 62mm polyp (sigmoid) and non bleeding inernal hemorrhoids.  Mammogram 03/06/16 - Birads I.

## 2016-09-09 NOTE — Assessment & Plan Note (Signed)
Has chronic back pain.  Walks with a Programmer, multimedia.  Has desired no further intervention.  Follow.  Also limits her activity.

## 2016-09-09 NOTE — Assessment & Plan Note (Signed)
Unable to take statin medications.  On zetia.  Follow lipid panel.

## 2016-09-09 NOTE — Assessment & Plan Note (Signed)
On protonix.  Stable.  

## 2016-09-09 NOTE — Assessment & Plan Note (Signed)
Has a history of PVCs and PACs as outlined.  Symptoms as outlined.  Seeing cardiology. W/up in progress.  Awaiting results.  Follow.

## 2016-09-10 ENCOUNTER — Encounter: Payer: Medicare Other | Attending: Cardiovascular Disease

## 2016-09-10 ENCOUNTER — Telehealth: Payer: Self-pay

## 2016-09-10 DIAGNOSIS — Z955 Presence of coronary angioplasty implant and graft: Secondary | ICD-10-CM | POA: Insufficient documentation

## 2016-09-10 NOTE — Telephone Encounter (Signed)
Charlotte Wagner left a message and is seeing her Dr 12/5.  Her Dr did not want her to exercise until they figure out why she is having PVCs and Bigeminy.  She will let us know when she is cleared to return.

## 2016-09-11 ENCOUNTER — Encounter: Payer: Self-pay | Admitting: Cardiology

## 2016-09-11 ENCOUNTER — Ambulatory Visit (INDEPENDENT_AMBULATORY_CARE_PROVIDER_SITE_OTHER): Payer: Medicare Other | Admitting: Cardiology

## 2016-09-11 VITALS — BP 142/62 | HR 57 | Ht 63.0 in | Wt 185.5 lb

## 2016-09-11 DIAGNOSIS — I10 Essential (primary) hypertension: Secondary | ICD-10-CM

## 2016-09-11 DIAGNOSIS — E784 Other hyperlipidemia: Secondary | ICD-10-CM | POA: Diagnosis not present

## 2016-09-11 DIAGNOSIS — E7849 Other hyperlipidemia: Secondary | ICD-10-CM

## 2016-09-11 DIAGNOSIS — I200 Unstable angina: Secondary | ICD-10-CM

## 2016-09-11 DIAGNOSIS — I2510 Atherosclerotic heart disease of native coronary artery without angina pectoris: Secondary | ICD-10-CM | POA: Diagnosis not present

## 2016-09-11 DIAGNOSIS — I493 Ventricular premature depolarization: Secondary | ICD-10-CM

## 2016-09-11 DIAGNOSIS — Z951 Presence of aortocoronary bypass graft: Secondary | ICD-10-CM

## 2016-09-11 DIAGNOSIS — Z9861 Coronary angioplasty status: Secondary | ICD-10-CM

## 2016-09-11 MED ORDER — VERAPAMIL HCL ER 240 MG PO TBCR: 240.0000 mg | EXTENDED_RELEASE_TABLET | Freq: Two times a day (BID) | ORAL | 3 refills | Status: DC

## 2016-09-11 NOTE — Patient Instructions (Addendum)
Medication Instructions:  Your physician has recommended you make the following change in your medication:  1. INCREASE Verapamil to 240 mg twice a day   Follow-Up: Your physician recommends that you schedule a follow-up appointment in: October 11, 2016 at 3:00PM  It was a pleasure seeing you today here in the office. Please do not hesitate to give [place] a call back if you have any further questions. [place], BSN

## 2016-09-11 NOTE — Progress Notes (Signed)
Cardiology Office Note   Date:  09/11/2016   ID:  Charlotte Wagner, DOB September 16, 1936, MRN [id number]  Referring Doctor:  [name], MD   Cardiologist:   Wende Bushy, MD   Reason for consultation:  Chief Complaint  Patient presents with  . other     F/u Stress/Holter. Pt  Reviewed meds with pt verbally. Pt. c/o frequent pvc's with shortness of breath.    hosp ffup after PCI   History of Present Illness: Charlotte Wagner is a 80 y.o. female who presents for Follow-up   Patient continues to have some element of dyspnea. She strongly believes this is related to [place]. She continues to have palpitations. She reports that the palpitations occur when she starts getting short of breath.   No chest pains. No lightheadedness. No syncope.  ROS:  Please see the history of present illness. Aside from mentioned under HPI, all other systems are reviewed and negative.     Past Medical History:  Diagnosis Date  . Allergy   . Arthritis    s/p bilateral knees and left hip replacement  . Asthma   . CAD (coronary artery disease)    a. 12/1996 s/p CABG x4 ([place]);  b. 2005 Pt reports stress test & cath, which revealed patent grafts.  . Cancer ([place])    melanoma right arm  . Carotid arterial disease ([place])    a. 04/2015 Carotid U/S: <50% bilat ICA stenosis.  . Chronic kidney disease   . Colon polyps    H/O  . Depression   . GERD (gastroesophageal reflux disease)    h/o hiatal hernia  . Headache    migraines in past  . Heart murmur    a. 04/2011 Echo: EF 55-60%, bilat atrial enlargement, mild to mod TR.  [name] History of chicken pox   . History of hiatal hernia   . Hx of migraines    rare now  . Hx: UTI (urinary tract infection)   . Hyperlipidemia    a. Statin intolerant -->on zetia.  . Hypertensive heart disease   . Lichen planus   . Melanoma ([place])   . Palpitations    a. rare PVC's and h/o SVT.  [name] PMR (polymyalgia rheumatica) ([facility])    h/o in setting of crestor usage.    [name] PSVT (paroxysmal supraventricular tachycardia) ([place])   . PUD (peptic ulcer disease)    remote history  . Raynaud's phenomenon   . Spinal stenosis   . Urine incontinence    H/O  . Vitamin D deficiency     Past Surgical History:  Procedure Laterality Date  . ADENOIDECTOMY     age 23  . BACK SURGERY     L3-L5  . BILATERAL CARPAL TUNNEL RELEASE    . BREAST BIOPSY Right    bx x 3 neg  . BREAST SURGERY Right    biopsy x 3 (all benign)  . CARDIAC CATHETERIZATION N/A 06/01/2016   Procedure: LEFT HEART CATH AND CORS/GRAFTS ANGIOGRAPHY;  Surgeon: Wellington Hampshire, MD;  Location: [facility];  Service: Cardiovascular;  Laterality: N/A;  . CARDIAC CATHETERIZATION N/A 06/01/2016   Procedure: Coronary Stent Intervention;  Surgeon: Wellington Hampshire, MD;  Location: [facility];  Service: Cardiovascular;  Laterality: N/A;  . CATARACT EXTRACTION W/PHACO Right 01/04/2016   Procedure: CATARACT EXTRACTION PHACO AND INTRAOCULAR LENS PLACEMENT (IOC);  Surgeon: Leandrew Koyanagi, MD;  Location: [facility];  Service: Ophthalmology;  Laterality: Right;  MALYUGIN  .  CATARACT EXTRACTION W/PHACO Left 01/25/2016   Procedure: CATARACT EXTRACTION PHACO AND INTRAOCULAR LENS PLACEMENT ([place]) left eye;  Surgeon: Leandrew Koyanagi, MD;  Location: [facility];  Service: Ophthalmology;  Laterality: Left;  MALYUGIN SHUGARCAINE  . CHOLECYSTECTOMY  90's  . COLONOSCOPY WITH PROPOFOL N/A 05/03/2015   Procedure: COLONOSCOPY WITH PROPOFOL;  Surgeon: Lollie Sails, MD;  Location: [facility]COPY;  Service: Endoscopy;  Laterality: N/A;  . CORONARY ARTERY BYPASS GRAFT  98   4 vessel  . ESOPHAGOGASTRODUODENOSCOPY N/A 03/22/2015   Procedure: ESOPHAGOGASTRODUODENOSCOPY (EGD);  Surgeon: Lollie Sails, MD;  Location: [facility] ENDOSCOPY;  Service: Endoscopy;  Laterality: N/A;  . JOINT REPLACEMENT     BILATERAL KNEE REPLACEMENTS  . KNEE ARTHROSCOPY W/ OATS PROCEDURE     Lt knee (9/01), Rt knee  (3/11), Lt hip (5/10)  . TOTAL HIP ARTHROPLASTY       reports that she has quit smoking. She has never used smokeless tobacco. She reports that she does not drink alcohol or use drugs.   family history includes Alcohol abuse in her father; Depression in her daughter, father, maternal grandmother, and son; Heart disease in her father; Hypertension in her maternal grandfather and paternal grandfather; Lung cancer in her sister; Stroke in her maternal grandmother; Thyroid disease in her daughter and mother.   Outpatient Medications Prior to Visit  Medication Sig Dispense Refill  . acetaminophen (TYLENOL) 500 MG tablet Take 1,000 mg by mouth 2 (two) times daily.     [name] amitriptyline (ELAVIL) 25 MG tablet Take 1-2 tablets (25-50 mg total) by mouth at bedtime. 60 tablet 5  . b complex vitamins tablet Take 1 tablet by mouth daily.    . bisoprolol (ZEBETA) 5 MG tablet Take 1 tablet (5 mg total) by mouth at bedtime. (Patient taking differently: Take 2.5 mg by mouth at bedtime. ) 30 tablet 3  . BRILINTA 90 MG TABS tablet take 1 tablet by mouth twice a day 60 tablet 3  . Cholecalciferol (VITAMIN D3) 2000 UNITS TABS Take 1 capsule by mouth daily.     . Coenzyme Q10 (COQ-10) 100 MG CAPS Take 1 tablet by mouth daily.    [name] ezetimibe (ZETIA) 10 MG tablet take 1 tablet by mouth once daily 30 tablet 7  . folic acid (FOLVITE) 1 MG tablet take 1 tablet by mouth once daily 30 tablet 5  . hydrocortisone 2.5 % cream apply to VULVA twice a day AT 10AM AND 4 PM 28 g 0  . ketoconazole (NIZORAL) 2 % cream Apply 1 application topically daily. (Patient taking differently: Apply 1 application topically daily as needed for irritation. ) 30 g 0  . loratadine (CLARITIN) 10 MG tablet Take 10 mg by mouth daily.    . meclizine (ANTIVERT) 25 MG tablet Take 25 mg by mouth 3 (three) times daily as needed for dizziness.    . miconazole (MICOTIN) 2 % cream Apply 1 application topically 2 (two) times daily as needed (irritation).    .  Multiple Vitamins-Minerals (HAIR/SKIN/NAILS PO) Take 1 tablet by mouth daily.    . nitroGLYCERIN (NITROSTAT) 0.4 MG SL tablet Place 1 tablet (0.4 mg total) under the tongue every 5 (five) minutes as needed for chest pain. Place 1 tablet (0.4 mg total) under the tongue every 5 (five) minutes , 3 times as needed for chest pain. 15 tablet 0  . Omega-3 Fatty Acids (FISH OIL PO) Take 2,000 mg by mouth daily.    . pantoprazole (PROTONIX) 40 MG tablet take  1 tablet by mouth twice a day 60 tablet 5  . Polyethylene Glycol 3350 (MIRALAX PO) Take 8.5 g by mouth at bedtime.     . ramipril (ALTACE) 10 MG capsule take 1 capsule by mouth once daily 30 capsule 3  . spironolactone (ALDACTONE) 25 MG tablet take 1 tablet by mouth every morning 30 tablet 5  . sucralfate (CARAFATE) 1 g tablet Take 1 g by mouth 2 (two) times daily as needed (acid reflux).    . ticagrelor (BRILINTA) 90 MG TABS tablet Take 1 tablet (90 mg total) by mouth 2 (two) times daily. 60 tablet 3  . traMADol (ULTRAM) 50 MG tablet Take 1 tablet (50 mg total) by mouth daily as needed. (Patient taking differently: Take 50 mg by mouth daily as needed for moderate pain. ) 20 tablet 0  . traZODone (DESYREL) 150 MG tablet take 1 and 2/3 tablets to 2 tablets by mouth at bedtime as directed 60 tablet 2  . verapamil (CALAN-SR) 180 MG CR tablet take 1 tablet by mouth twice a day 60 tablet 5  . vitamin B-12 (CYANOCOBALAMIN) 1000 MCG tablet Take 1,000 mcg by mouth daily.    . vitamin C (ASCORBIC ACID) 500 MG tablet Take 500 mg by mouth at bedtime.     No facility-administered medications prior to visit.      Allergies: Beta adrenergic blockers; Albuterol; Aspirin; Penicillins; Statins; Sulfa antibiotics; Tetracycline; and Tetracyclines & related    PHYSICAL EXAM: VS:  BP (!) 142/62 (BP Location: Left Arm, Patient Position: Sitting, Cuff Size: Normal)   Pulse (!) 57   Ht 5\' 3"  (1.6 m)   Wt 185 lb 8 oz (84.1 kg)   BMI 32.86 kg/m  , Body mass index is  32.86 kg/m. Wt Readings from Last 3 Encounters:  09/11/16 185 lb 8 oz (84.1 kg)  09/06/16 186 lb 9.6 oz (84.6 kg)  07/11/16 183 lb 6.4 oz (83.2 kg)    GENERAL:  well developed, well nourished, obese, not in acute distress HEENT: normocephalic, pink conjunctivae, anicteric sclerae, no xanthelasma, normal dentition, oropharynx clear NECK:  no neck vein engorgement, JVP normal, no hepatojugular reflux, carotid upstroke brisk and symmetric, no bruit, no thyromegaly, no lymphadenopathy LUNGS:  good respiratory effort, clear to auscultation bilaterally CV:  PMI not displaced, no thrills, no lifts, S1 and S2 within normal limits, no palpable S3 or S4, no murmurs, no rubs, no gallops ABD:  Soft, nontender, nondistended, normoactive bowel sounds, no abdominal aortic bruit, no hepatomegaly, no splenomegaly, bruise noted on the left lower quadrant MS: nontender back, no kyphosis, no scoliosis, no joint deformities EXT:  2+ DP/PT pulses, no edema, no varicosities, no cyanosis, no clubbing. RFA is intact, no bruit. Extensive bruising but no hematoma on the right femoral artery access site --this has since resolved compared to last visit's PE SKIN: warm, nondiaphoretic, normal turgor, no ulcers NEUROPSYCH: alert, oriented to person, place, and time, sensory/motor grossly intact, normal mood, appropriate affect  Recent Labs: 03/26/2016: ALT 18 06/12/2016: BUN 24; Creatinine, Ser 0.95; Potassium 4.3; Sodium 134 08/23/2016: Hemoglobin 13.4; Platelets 245.0   Lipid Panel    Component Value Date/Time   CHOL 221 (H) 03/26/2016 0932   TRIG 119.0 03/26/2016 0932   HDL 54.80 03/26/2016 0932   CHOLHDL 4 03/26/2016 0932   VLDL 23.8 03/26/2016 0932   LDLCALC 143 (H) 03/26/2016 0932   LDLDIRECT 126.7 07/03/2013 0937     Other studies Reviewed:  EKG:  The ekg from 06/12/2016 was personally  reviewed by me and it revealed sinus rhythm, 67 BPM. Low-voltage QRS.  EKG from 08/23/2016 was personally reviewed by  me and it showed sinus rhythm, 70 BPM, frequent PVCs and bigeminy. Nonspecific ST-T wave changes.  Additional studies/ records that were reviewed personally reviewed by me today include:  Left heart cath 06/01/2016:  Prox LAD lesion, 100 %stenosed.  Ost Cx to Prox Cx lesion, 50 %stenosed.  Ost LM to LM lesion, 70 %stenosed.  Mid RCA lesion, 100 %stenosed.  Prox RCA lesion, 60 %stenosed.  SVG graft was visualized by angiography and is normal in caliber.  Post Atrio lesion, 60 %stenosed.  SVG graft was visualized by angiography and is normal in caliber.  The graft exhibits mild .  The flow in the graft is reversed.  Origin lesion, 30 %stenosed.  SVG graft was visualized by angiography.  Prox Graft lesion, 30 %stenosed.  LIMA graft was visualized by angiography and is normal in caliber and anatomically normal.  The left ventricular systolic function is normal.  LV end diastolic pressure is mildly elevated.  The left ventricular ejection fraction is 55-65% by visual estimate.  A STENT XIENCE ALPINE RX 3.0X15 drug eluting stent was successfully placed, and does not overlap previously placed stent.  Prox Graft lesion, 90 %stenosed.  Post intervention, there is a 0% residual stenosis.   1. Severe underlying three-vessel coronary artery disease with patent grafts including LIMA to LAD, SVG to diagonal, SVG to left circumflex and SVG to RCA. Severe 90% stenosis and SVG to RCA is likely the culprit for unstable angina.  2. Normal LV systolic function and mildly elevated left ventricular end-diastolic pressure.  3. Successful drug-eluting stent placement to the SVG to RCA using a distal protection device.  Recommendations: The patient is severely intolerant to aspirin and she reports inability to take the medication. Thus, I elected to treat her with Brilinta monotherapy. Continue aggressive treatment for coronary artery disease.  Carotid u/s 04/25/2015, ordered by  PCP: Less than 50% stenosis bilateral ICA Severe left ECA disease  Echo 06/19/2016: Left ventricle: The cavity size was normal. Systolic function was   normal. The estimated ejection fraction was in the range of 60%   to 65%. Wall motion was normal; there were no regional wall   motion abnormalities. Left ventricular diastolic function   parameters were normal. - Mitral valve: There was mild regurgitation. - Left atrium: The atrium was at the upper limits of normal in   size. - Right ventricle: Systolic function was normal. - Pulmonary arteries: Systolic pressure was mildly elevated. PA   peak pressure: 40 mm Hg (S).  Nuclear stress is 08/29/2016:  Pharmacological myocardial perfusion imaging study with no significant  ischemia Normal wall motion, EF estimated at 54% No EKG changes concerning for ischemia at peak stress or in recovery. Low risk scan  Holter 08/28/2016: Overall rhythm was sinus. Heart rate ranged from 43-82 bpm, average of 53 BPM.  Supraventricular ectopy: 61 isolated PACs. One 4 beat run of SVT, likely atrial tachycardia, 110 BPM.  Ventricular ectopy was 15% of the total number of beats: 12,062 isolated PVCs. 54 ventricular couplets. 6165 ventricular bigeminal cycles.  ASSESSMENT AND PLAN: PVCs, ventricular bigeminy Palpitations/PSVT/PVC's Occurs when she starts getting short of breath. The shortness of breath she feels is related to Brilinta 15% ventricular ectopy burdened She was unable to tolerate the higher dose of Zebeta She has significant intolerance to atenolol and metoprolol After much discussion, we decided to try a higher dose  of verapamil at night. Continue 180 in the morning, 240 in the evening. Discussed findings of stress testing. Patient reassured.  CAD CAD s/p CABG x 4 in 1998 in [place], [place].  Last cardiac eval took place in 2005 with stress testing and cath, apparently revealing 4/4 patent grafts. S/P LHCath and DES to SVG to RCA  06/01/2016. Other grafts patent Cont ?blocker - she is very sensitive to this. She had tried bid dosing and developed 1st degree avb. She is tolerating current dose of Zebeta 2.5mg  qd.  She did not tolerate Tenormin and metoprolol in the past - lowered blood pressure too much. Cont acei.  Resume home dose of zetia (statin intolerant).  She is allergic to ASA  - significant GI side effects. Developed significant bruising to Plavix.  We discussed importance of staying on Hope for 1 year ideally at least to be transitioned to lifelong antiplatelet (will be rediscussed in future visits). Since she firmly believes that the palpitations are related to the dyspnea, which in turn is a side effect of the Brilinta, she is looking forward to at least 6 months of Brilinta. We will reassess when the time comes. We will then probably cover her with Plavix.  Patient may resume cardiac rehabilitation.  Hypertension  Continue monitor blood pressure. If patient notices that her blood pressure remains or continues to drop further, she should call our office.   Hyperlipidemia Statin intolerant. Resume zetia. Patient developed significant muscle weakness and intolerance to rosuvastatin, atorvastatin, simvastatin, and pravastatin.   carotid artery disease  Pt followed by PCP.  Current medicines are reviewed at length with the patient today.  The patient does not have concerns regarding medicines.  Labs/ tests ordered today include:  Orders Placed This Encounter  Procedures  . EKG 12-Lead    I had a lengthy and detailed discussion with the patient regarding diagnoses, prognosis, diagnostic options, treatment options , and side effects of medications.   I counseled the patient on importance of lifestyle modification including heart healthy diet, regular physical activity .   Disposition:   FU with undersigned After tests  I spent at least 40 minutes with the patient today and more than 50% of the  time was spent counseling the patient and coordinating care.     Signed, Wende Bushy, MD  09/11/2016 3:28 PM    [place]  This note was generated in part with voice recognition software and I apologize for any typographical errors that were not detected and corrected.

## 2016-09-12 ENCOUNTER — Encounter: Payer: Self-pay | Admitting: *Deleted

## 2016-09-12 ENCOUNTER — Encounter: Payer: Self-pay | Admitting: Cardiology

## 2016-09-12 DIAGNOSIS — Z9861 Coronary angioplasty status: Secondary | ICD-10-CM

## 2016-09-12 NOTE — Telephone Encounter (Signed)
Charlotte Wagner called today to communicate that she has been to see her physician, and she has been cleared to return to [facility], returning on 09/13/16.  Clearance verified in office visit note.

## 2016-09-12 NOTE — Progress Notes (Signed)
Cardiac Individual Treatment Plan  Patient Details  Name: Charlotte Wagner MRN: [id number] Date of Birth: 19-Oct-1935 Referring Provider:   Flowsheet Row Cardiac Rehab from 06/21/2016 in [facility] Cardiac and Pulmonary Rehab  Referring Provider  Kathlyn Sacramento MD      Initial Encounter Date:  Flowsheet Row Cardiac Rehab from 06/21/2016 in [facility] Cardiac and Pulmonary Rehab  Date  06/21/16  Referring Provider  Kathlyn Sacramento MD      Visit Diagnosis: S/P PTCA (percutaneous transluminal coronary angioplasty)  Patient's Home Medications on Admission:  Current Outpatient Prescriptions:  .  acetaminophen (TYLENOL) 500 MG tablet, Take 1,000 mg by mouth 2 (two) times daily. , Disp: , Rfl:  .  amitriptyline (ELAVIL) 25 MG tablet, Take 1-2 tablets (25-50 mg total) by mouth at bedtime., Disp: 60 tablet, Rfl: 5 .  b complex vitamins tablet, Take 1 tablet by mouth daily., Disp: , Rfl:  .  bisoprolol (ZEBETA) 5 MG tablet, Take 1 tablet (5 mg total) by mouth at bedtime. (Patient taking differently: Take 2.5 mg by mouth at bedtime. ), Disp: 30 tablet, Rfl: 3 .  BRILINTA 90 MG TABS tablet, take 1 tablet by mouth twice a day, Disp: 60 tablet, Rfl: 3 .  Cholecalciferol (VITAMIN D3) 2000 UNITS TABS, Take 1 capsule by mouth daily. , Disp: , Rfl:  .  Coenzyme Q10 (COQ-10) 100 MG CAPS, Take 1 tablet by mouth daily., Disp: , Rfl:  .  ezetimibe (ZETIA) 10 MG tablet, take 1 tablet by mouth once daily, Disp: 30 tablet, Rfl: 7 .  folic acid (FOLVITE) 1 MG tablet, take 1 tablet by mouth once daily, Disp: 30 tablet, Rfl: 5 .  hydrocortisone 2.5 % cream, apply to VULVA twice a day AT 10AM AND 4 PM, Disp: 28 g, Rfl: 0 .  ketoconazole (NIZORAL) 2 % cream, Apply 1 application topically daily. (Patient taking differently: Apply 1 application topically daily as needed for irritation. ), Disp: 30 g, Rfl: 0 .  loratadine (CLARITIN) 10 MG tablet, Take 10 mg by mouth daily., Disp: , Rfl:  .  meclizine (ANTIVERT) 25 MG tablet, Take  25 mg by mouth 3 (three) times daily as needed for dizziness., Disp: , Rfl:  .  miconazole (MICOTIN) 2 % cream, Apply 1 application topically 2 (two) times daily as needed (irritation)., Disp: , Rfl:  .  Multiple Vitamins-Minerals (HAIR/SKIN/NAILS PO), Take 1 tablet by mouth daily., Disp: , Rfl:  .  nitroGLYCERIN (NITROSTAT) 0.4 MG SL tablet, Place 1 tablet (0.4 mg total) under the tongue every 5 (five) minutes as needed for chest pain. Place 1 tablet (0.4 mg total) under the tongue every 5 (five) minutes , 3 times as needed for chest pain., Disp: 15 tablet, Rfl: 0 .  Omega-3 Fatty Acids (FISH OIL PO), Take 2,000 mg by mouth daily., Disp: , Rfl:  .  pantoprazole (PROTONIX) 40 MG tablet, take 1 tablet by mouth twice a day, Disp: 60 tablet, Rfl: 5 .  Polyethylene Glycol 3350 (MIRALAX PO), Take 8.5 g by mouth at bedtime. , Disp: , Rfl:  .  ramipril (ALTACE) 10 MG capsule, take 1 capsule by mouth once daily, Disp: 30 capsule, Rfl: 3 .  spironolactone (ALDACTONE) 25 MG tablet, take 1 tablet by mouth every morning, Disp: 30 tablet, Rfl: 5 .  sucralfate (CARAFATE) 1 g tablet, Take 1 g by mouth 2 (two) times daily as needed (acid reflux)., Disp: , Rfl:  .  ticagrelor (BRILINTA) 90 MG TABS tablet, Take 1 tablet (90  mg total) by mouth 2 (two) times daily., Disp: 60 tablet, Rfl: 3 .  traMADol (ULTRAM) 50 MG tablet, Take 1 tablet (50 mg total) by mouth daily as needed. (Patient taking differently: Take 50 mg by mouth daily as needed for moderate pain. ), Disp: 20 tablet, Rfl: 0 .  traZODone (DESYREL) 150 MG tablet, take 1 and 2/3 tablets to 2 tablets by mouth at bedtime as directed, Disp: 60 tablet, Rfl: 2 .  verapamil (CALAN-SR) 240 MG CR tablet, Take 1 tablet (240 mg total) by mouth 2 (two) times daily., Disp: 60 tablet, Rfl: 3 .  vitamin B-12 (CYANOCOBALAMIN) 1000 MCG tablet, Take 1,000 mcg by mouth daily., Disp: , Rfl:  .  vitamin C (ASCORBIC ACID) 500 MG tablet, Take 500 mg by mouth at bedtime., Disp: , Rfl:    Past Medical History: Past Medical History:  Diagnosis Date  . Allergy   . Arthritis    s/p bilateral knees and left hip replacement  . Asthma   . CAD (coronary artery disease)    a. 12/1996 s/p CABG x4 [facility], [place]);  b. 2005 Pt reports stress test & cath, which revealed patent grafts.  . Cancer (HCC)    melanoma right arm  . Carotid arterial disease (HCC)    a. 04/2015 Carotid U/S: <50% bilat ICA stenosis.  . Chronic kidney disease   . Colon polyps    H/O  . Depression   . GERD (gastroesophageal reflux disease)    h/o hiatal hernia  . Headache    migraines in past  . Heart murmur    a. 04/2011 Echo: EF 55-60%, bilat atrial enlargement, mild to mod TR.  [name] History of chicken pox   . History of hiatal hernia   . Hx of migraines    rare now  . Hx: UTI (urinary tract infection)   . Hyperlipidemia    a. Statin intolerant -->on zetia.  . Hypertensive heart disease   . Lichen planus   . Melanoma (HCC)   . Palpitations    a. rare PVC's and h/o SVT.  [name] PMR (polymyalgia rheumatica) (HCC)    h/o in setting of crestor usage.  [name] PSVT (paroxysmal supraventricular tachycardia) (HCC)   . PUD (peptic ulcer disease)    remote history  . Raynaud's phenomenon   . Spinal stenosis   . Urine incontinence    H/O  . Vitamin D deficiency     Tobacco Use: History  Smoking Status  . Former Smoker  Smokeless Tobacco  . Never Used    Comment: quit 44+ yrs ago    Labs: Recent Review Flowsheet Data    Labs for ITP Cardiac and Pulmonary Rehab Latest Ref Rng & Units 06/15/2014 12/02/2014 03/21/2015 09/12/2015 03/26/2016   Cholestrol 0 - 200 mg/dL 718(W) 784 364(B) 546(F) 221(H)   LDLCALC 0 - 99 mg/dL 322(S) 839(R) 491(O) 065(T) 143(H)   LDLDIRECT mg/dL - - - - -   HDL >42.73 mg/dL 91.89 14.30 56.28 27.40 54.80   Trlycerides 0.0 - 149.0 mg/dL 718.4 518.6 21.0 90.2 277.3       Exercise Target Goals:    Exercise Program Goal: Individual exercise prescription set with THRR, safety &  activity barriers. Participant demonstrates ability to understand and report RPE using BORG scale, to self-measure pulse accurately, and to acknowledge the importance of the exercise prescription.  Exercise Prescription Goal: Starting with aerobic activity 30 plus minutes a day, 3 days per week for initial exercise prescription. Provide home exercise  prescription and guidelines that participant acknowledges understanding prior to discharge.  Activity Barriers & Risk Stratification:     Activity Barriers & Cardiac Risk Stratification - 06/21/16 1434      Activity Barriers & Cardiac Risk Stratification   Activity Barriers Arthritis;Joint Problems;Shortness of Breath;Back Problems;Balance Concerns;Assistive Device;Left Hip Replacement;Left Knee Replacement;Right Knee Replacement;Neck/Spine Problems;Deconditioning;Muscular Weakness;History of Falls   Cardiac Risk Stratification High      6 Minute Walk:     6 Minute Walk    Row Name 06/21/16 1435         6 Minute Walk   Phase Initial     Distance 1060 feet     Walk Time 6 minutes     # of Rest Breaks 0     MPH 2.01     METS 1.75     RPE 13     Perceived Dyspnea  2     VO2 Peak 6.12     Symptoms Yes (comment)     Comments back pain     Resting HR 75 bpm     Resting BP 132/82     Max Ex. HR 98 bpm     Max Ex. BP 136/64     2 Minute Post BP 130/66        Initial Exercise Prescription:     Initial Exercise Prescription - 06/21/16 1400      Date of Initial Exercise RX and Referring Provider   Date 06/21/16   Referring Provider Kathlyn Sacramento MD     Treadmill   MPH 1   Grade 0   Minutes 15   METs 1.77     NuStep   Level 2   Minutes 15   METs 1.8     Biostep-RELP   Level 2   Minutes 15   METs 2     Prescription Details   Frequency (times per week) 3   Duration Progress to 45 minutes of aerobic exercise without signs/symptoms of physical distress     Intensity   THRR 40-80% of Max Heartrate 102-129    Ratings of Perceived Exertion 11-15   Perceived Dyspnea 0-4     Progression   Progression Continue to progress workloads to maintain intensity without signs/symptoms of physical distress.     Resistance Training   Training Prescription Yes   Weight 2 lbs   Reps 10-12      Perform Capillary Blood Glucose checks as needed.  Exercise Prescription Changes:     Exercise Prescription Changes    Row Name 06/21/16 1400 07/11/16 1400 07/12/16 1700 07/26/16 1200 08/08/16 1300     Exercise Review   Progression -  walk test results Yes Yes Yes Yes     Response to Exercise   Blood Pressure (Admit) 132/82 148/74 148/74 132/64 130/80   Blood Pressure (Exercise) 136/64 142/82 142/82 148/70  -   Blood Pressure (Exit) 130/66 116/60 116/60 124/66 124/70   Heart Rate (Admit) 75 bpm 50 bpm 50 bpm 68 bpm 80 bpm   Heart Rate (Exercise) 98 bpm 116 bpm 116 bpm 110 bpm 105 bpm   Heart Rate (Exit)  - 71 bpm 71 bpm 68 bpm 66 bpm   Oxygen Saturation (Admit) 97 %  -  -  -  -   Rating of Perceived Exertion (Exercise) '13 14 14 14 13   '$ Perceived Dyspnea (Exercise) 2  -  -  -  -   Symptoms back pain  -  -  -  -  Comments  -  - Home Exercise Guidelines given 07/12/16  -  -   Duration  - Progress to 45 minutes of aerobic exercise without signs/symptoms of physical distress Progress to 45 minutes of aerobic exercise without signs/symptoms of physical distress Progress to 45 minutes of aerobic exercise without signs/symptoms of physical distress Progress to 45 minutes of aerobic exercise without signs/symptoms of physical distress   Intensity  - THRR unchanged THRR unchanged THRR unchanged THRR unchanged     Progression   Progression  - Continue to progress workloads to maintain intensity without signs/symptoms of physical distress. Continue to progress workloads to maintain intensity without signs/symptoms of physical distress. Continue to progress workloads to maintain intensity without signs/symptoms of  physical distress. Continue to progress workloads to maintain intensity without signs/symptoms of physical distress.   Average METs  - 2.14 2.14 2.9 2.53     Resistance Training   Training Prescription  - Yes Yes Yes Yes   Weight  - '3 3 3 3   '$ Reps  - 10-12 10-12 10-12 10-12     Interval Training   Interval Training  - No No No No     Treadmill   MPH  - 1 1 1.9 2   Grade  - 0 0 0 0   Minutes  - '15 15 15 15   '$ METs  - 1.77 1.77  - 2.53     NuStep   Level  - '6 6 6  '$ -   Minutes  - '15 15 15  '$ -   METs  - 2.5 2.5 2.9  -     Biostep-RELP   Level  -  -  -  - 3   Minutes  -  -  -  - 15     Home Exercise Plan   Plans to continue exercise at  -  - [employer] (comment)  walking and [employer]  -  -   Frequency  -  - Add 3 additional days to program exercise sessions.  -  -   Row Name 08/23/16 1200 09/06/16 1100           Exercise Review   Progression Yes Yes        Response to Exercise   Blood Pressure (Admit) 138/78 138/78      Blood Pressure (Exercise) 144/70 144/70      Blood Pressure (Exit) 132/72 132/72      Heart Rate (Admit) 78 bpm 78 bpm      Heart Rate (Exercise) 90 bpm 90 bpm      Heart Rate (Exit) 65 bpm 65 bpm      Rating of Perceived Exertion (Exercise) 13 13      Duration Progress to 45 minutes of aerobic exercise without signs/symptoms of physical distress Progress to 45 minutes of aerobic exercise without signs/symptoms of physical distress      Intensity THRR unchanged THRR unchanged        Progression   Progression Continue to progress workloads to maintain intensity without signs/symptoms of physical distress. Continue to progress workloads to maintain intensity without signs/symptoms of physical distress.      Average METs 3.6 3.6        Resistance Training   Training Prescription Yes Yes      Weight 3 3      Reps 10-12 10-12        Interval Training   Interval Training No No  NuStep   Level 6 6      Minutes 15 15      METs 3.2 3.2          Exercise Comments:     Exercise Comments    Row Name 06/28/16 1812 07/11/16 1421 07/12/16 1723 07/26/16 1246 08/08/16 1314   Exercise Comments First full day of exercise!  Patient was oriented to gym and equipment including functions, settings, policies, and procedures.  Patient's individual exercise prescription and treatment plan were reviewed.  All starting workloads were established based on the results of the 6 minute walk test done at initial orientation visit.  The plan for exercise progression was also introduced and progression will be customized based on patient's performance and goals. Caretha is progressing well with exercise. Reviewed home exercise with pt today.  Pt plans to walk at home and go to class/gym at [facility] for exercise.  Reviewed THR, pulse, RPE, sign and symptoms, NTG use, and when to call 911 or MD.  Also discussed weather considerations and indoor options.  Pt voiced understanding. Tawny continues to progress well with exercise. Nyjai is porgressing well with exercise.   Row Name 08/23/16 1300 09/06/16 1118         Exercise Comments Cherina is doing well with exercise during HT and also using the facility at [facility] on days she doen't attend HT. Kristeen has not attended since 08/20/16.         Discharge Exercise Prescription (Final Exercise Prescription Changes):     Exercise Prescription Changes - 09/06/16 1100      Exercise Review   Progression Yes     Response to Exercise   Blood Pressure (Admit) 138/78   Blood Pressure (Exercise) 144/70   Blood Pressure (Exit) 132/72   Heart Rate (Admit) 78 bpm   Heart Rate (Exercise) 90 bpm   Heart Rate (Exit) 65 bpm   Rating of Perceived Exertion (Exercise) 13   Duration Progress to 45 minutes of aerobic exercise without signs/symptoms of physical distress   Intensity THRR unchanged     Progression   Progression Continue to progress workloads to maintain intensity without signs/symptoms of physical  distress.   Average METs 3.6     Resistance Training   Training Prescription Yes   Weight 3   Reps 10-12     Interval Training   Interval Training No     NuStep   Level 6   Minutes 15   METs 3.2      Nutrition:  Target Goals: Understanding of nutrition guidelines, daily intake of sodium '1500mg'$ , cholesterol '200mg'$ , calories 30% from fat and 7% or less from saturated fats, daily to have 5 or more servings of fruits and vegetables.  Biometrics:     Pre Biometrics - 06/21/16 1443      Pre Biometrics   Height 5' 3.5" (1.613 m)   Weight 184 lb 8 oz (83.7 kg)   Waist Circumference 37.5 inches   Hip Circumference 47 inches   Waist to Hip Ratio 0.8 %   BMI (Calculated) 32.2   Single Leg Stand 1.1 seconds       Nutrition Therapy Plan and Nutrition Goals:     Nutrition Therapy & Goals - 06/21/16 1455      Nutrition Therapy   Diet --  Lenzie prefers not to meet individually with the Cardiac Rehab Registered Dietician.      Nutrition Discharge: Rate Your Plate Scores:     Nutrition Assessments -  06/21/16 1456      Rate Your Plate Scores   Pre Score 58   Pre Score % 64 %      Nutrition Goals Re-Evaluation:     Nutrition Goals Re-Evaluation    [place] Name 08/06/16 1406             Personal Goal #1 Re-Evaluation   Personal Goal #1 Cont heart healthy diet.       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 "I know what to eat but always I haven't eaten correctly"          Psychosocial: Target Goals: Acknowledge presence or absence of depression, maximize coping skills, provide positive support system. Participant is able to verbalize types and ability to use techniques and skills needed for reducing stress and depression.  Initial Review & Psychosocial Screening:     Initial Psych Review & Screening - 06/21/16 1441      Initial Review   Current issues with History of  Depression     Family Dynamics   Good Support System? Yes   Comments Vetta reports that she has a family history of depression. 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     Screening Interventions   Interventions Encouraged to exercise      Quality of Life Scores:     Quality of Life - 06/21/16 1442      Quality of Life Scores   Health/Function Pre 23.18 %   Socioeconomic Pre 28.93 %   Psych/Spiritual Pre 29.14 %   Family Pre 30 %   GLOBAL Pre 26.59 %      PHQ-9: Recent Review Flowsheet Data    Depression screen [facility] 2/9 09/06/2016 06/21/2016 06/14/2016 02/23/2016 08/16/2015   Decreased Interest 0 1 0 0 0   Down, Depressed, Hopeless 0 1 0 0 0   PHQ - 2 Score 0 2 0 0 0   Altered sleeping - 2 - - -   Tired, decreased energy - 1 - - -   Change in appetite - 0 - - -   Feeling bad or failure about yourself  - 0 - - -   Trouble concentrating - 0 - - -   Moving slowly or fidgety/restless - 0 - - -   Suicidal thoughts - 0 - - -   PHQ-9 Score - 5 - - -   Difficult doing work/chores - Somewhat difficult - - -      Psychosocial Evaluation and Intervention:     Psychosocial Evaluation - 07/02/16 1721      Psychosocial Evaluation & Interventions   Interventions Encouraged to exercise with the program and follow exercise prescription   Comments Counselor met with Ms. Taff today for initial psychosocial evaluation.  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  Ms. Belasco lives in a retirement community and has a strong support system with a daughter who lives close by and active involvement in her local church.  Ms. Taha has multiple health issues and reports she is sleeping better now with some medication adjustments.  She  admits to a family history of  depression and anxiety and she has been taking medications for her own mood issues for quite some time.  She reports her current mood is typically positive.  Ms. Guizar states her primary stressors are her health currently which includes bouts of breathlessness; as well as experiencing a great deal of pain when she stands for long periods of time; which she attributes to her spinal stenosis which keeps her bent over and walking with a cane or walker most of the day.  Counselor will continue to follow with Ms. Gracy while in this program.        Psychosocial Re-Evaluation:     Psychosocial Re-Evaluation    [place] Name 08/06/16 1412 08/23/16 1424           Psychosocial Re-Evaluation   Interventions Encouraged to attend Cardiac Rehabilitation for the exercise  -      Comments Nafisah said it was stressful getting a notice from [facility] that they would not pay for what Medicare doesn't pay for. Yariana said she was glad to find out that Medicare does cover Cardiac Rehab.  Mirna called today and said she is sorry that she will miss Cardiac Rehab but  she will be out a little bit since she reports that MD wants to find out what is going on with her heart.          Vocational Rehabilitation: Provide vocational rehab assistance to qualifying candidates.   Vocational Rehab Evaluation & Intervention:     Vocational Rehab - 06/21/16 1435      Initial Vocational Rehab Evaluation & Intervention   Assessment shows need for Vocational Rehabilitation No      Education: Education Goals: Education classes will be provided on a weekly basis, covering required topics. Participant will state understanding/return demonstration of topics presented.  Learning Barriers/Preferences:     Learning Barriers/Preferences - 06/21/16 1434      Learning Barriers/Preferences   Learning Barriers None;Exercise Concerns   Learning Preferences Audio;Group Instruction;Written  Material      Education Topics: General Nutrition Guidelines/Fats and Fiber: -Group instruction provided by verbal, written material, models and posters to present the general guidelines for heart healthy nutrition. Gives an explanation and review of dietary fats and fiber.   Controlling Sodium/Reading Food Labels: -Group verbal and written material supporting the discussion of sodium use in heart healthy nutrition. Review and explanation with models, verbal and written materials for utilization of the food label.   Exercise Physiology & Risk Factors: - Group verbal and written instruction with models to review the exercise physiology of the cardiovascular system and associated critical values. Details cardiovascular disease risk factors and the goals associated with each risk factor. Flowsheet Row Cardiac Rehab from 07/23/2016 in [facility] Cardiac and Pulmonary Rehab  Date  07/09/16  Educator  [facility]  Instruction Review Code  2- meets goals/outcomes      Aerobic Exercise & Resistance Training: - Gives group verbal and written discussion on the health impact of inactivity. On the components of aerobic and resistive training programs and the benefits of this training and how to safely progress through these programs. Flowsheet Row Cardiac Rehab from 07/23/2016 in [facility] Cardiac and Pulmonary Rehab  Date  07/16/16  Educator  [facility]  Instruction Review Code  2- meets goals/outcomes      Flexibility, Balance, General Exercise Guidelines: - Provides group verbal and written instruction on the benefits of flexibility and balance training programs. Provides general exercise guidelines with specific guidelines to those with  heart or lung disease. Demonstration and skill practice provided.   Stress Management: - Provides group verbal and written instruction about the health risks of elevated stress, cause of high stress, and healthy ways to reduce stress.   Depression: - Provides group verbal and  written instruction on the correlation between heart/lung disease and depressed mood, treatment options, and the stigmas associated with seeking treatment.   Anatomy & Physiology of the Heart: - Group verbal and written instruction and models provide basic cardiac anatomy and physiology, with the coronary electrical and arterial systems. Review of: AMI, Angina, Valve disease, Heart Failure, Cardiac Arrhythmia, Pacemakers, and the ICD. Flowsheet Row Cardiac Rehab from 07/23/2016 in [facility] Cardiac and Pulmonary Rehab  Date  07/23/16  Educator  CE  Instruction Review Code  2- meets goals/outcomes      Cardiac Procedures: - Group verbal and written instruction and models to describe the testing methods done to diagnose heart disease. Reviews the outcomes of the test results. Describes the treatment choices: Medical Management, Angioplasty, or Coronary Bypass Surgery.   Cardiac Medications: - Group verbal and written instruction to review commonly prescribed medications for heart disease. Reviews the medication, class of the drug, and side effects. Includes the steps to properly store meds and maintain the prescription regimen.   Go Sex-Intimacy & Heart Disease, Get SMART - Goal Setting: - Group verbal and written instruction through game format to discuss heart disease and the return to sexual intimacy. Provides group verbal and written material to discuss and apply goal setting through the application of the S.M.A.R.T. Method.   Other Matters of the Heart: - Provides group verbal, written materials and models to describe Heart Failure, Angina, Valve Disease, and Diabetes in the realm of heart disease. Includes description of the disease process and treatment options available to the cardiac patient. Flowsheet Row Cardiac Rehab from 07/23/2016 in [facility] Cardiac and Pulmonary Rehab  Date  07/23/16  Educator  CE  Instruction Review Code  2- meets goals/outcomes      Exercise & Equipment  Safety: - Individual verbal instruction and demonstration of equipment use and safety with use of the equipment. Flowsheet Row Cardiac Rehab from 07/23/2016 in [facility] Cardiac and Pulmonary Rehab  Date  06/21/16  Educator  C. EnterkinRN  Instruction Review Code  1- partially meets, needs review/practice      Infection Prevention: - Provides verbal and written material to individual with discussion of infection control including proper hand washing and proper equipment cleaning during exercise session. Flowsheet Row Cardiac Rehab from 07/23/2016 in [facility] Cardiac and Pulmonary Rehab  Date  06/21/16  Educator  C. EnterkinRN  Instruction Review Code  2- meets goals/outcomes      Falls Prevention: - Provides verbal and written material to individual with discussion of falls prevention and safety. Flowsheet Row Cardiac Rehab from 07/23/2016 in [facility] Cardiac and Pulmonary Rehab  Date  06/21/16  Educator  C. Sweetwater  Instruction Review Code  2- meets goals/outcomes      Diabetes: - Individual verbal and written instruction to review signs/symptoms of diabetes, desired ranges of glucose level fasting, after meals and with exercise. Advice that pre and post exercise glucose checks will be done for 3 sessions at entry of program.    Knowledge Questionnaire Score:     Knowledge Questionnaire Score - 06/21/16 1435      Knowledge Questionnaire Score   Pre Score 26      Core Components/Risk Factors/Patient Goals at Admission:  Personal Goals and Risk Factors at Admission - 06/21/16 1440      Core Components/Risk Factors/Patient Goals on Admission   Increase Strength and Stamina Yes   Intervention Provide advice, education, support and counseling about physical activity/exercise needs.;Develop an individualized exercise prescription for aerobic and resistive training based on initial evaluation findings, risk stratification, comorbidities and participant's personal goals.    Expected Outcomes Achievement of increased cardiorespiratory fitness and enhanced flexibility, muscular endurance and strength shown through measurements of functional capacity and personal statement of participant.   Hypertension Yes   Intervention Provide education on lifestyle modifcations including regular physical activity/exercise, weight management, moderate sodium restriction and increased consumption of fresh fruit, vegetables, and low fat dairy, alcohol moderation, and smoking cessation.;Monitor prescription use compliance.   Expected Outcomes Short Term: Continued assessment and intervention until BP is < 140/22m HG in hypertensive participants. < 130/867mHG in hypertensive participants with diabetes, heart failure or chronic kidney disease.;Long Term: Maintenance of blood pressure at goal levels.   Lipids Yes   Intervention Provide education and support for participant on nutrition & aerobic/resistive exercise along with prescribed medications to achieve LDL '70mg'$ , HDL >'40mg'$ .   Expected Outcomes Short Term: Participant states understanding of desired cholesterol values and is compliant with medications prescribed. Participant is following exercise prescription and nutrition guidelines.;Long Term: Cholesterol controlled with medications as prescribed, with individualized exercise RX and with personalized nutrition plan. Value goals: LDL < '70mg'$ , HDL > 40 mg.      Core Components/Risk Factors/Patient Goals Review:      Goals and Risk Factor Review    Row Name 07/12/16 1721 08/06/16 1409           Core Components/Risk Factors/Patient Goals Review   Personal Goals Review Weight Management/Obesity;Hypertension;Lipids  -      Review AmLaurieanns trying to "behave" with watching her diet more closely.  Her blood pressures have been lower than normal.  She is tolerating her statins without problems. AmJalynneaid her blood pressure when she first started on teh Brilinta was 100systolic but is  better now at 120/70. AmShondellaid she "displaced her iliac joints so didn't use the treadmill last week but did use the other machines without any problems. AmSandrikaeports that her stamina has gotten better since "she pushes her self alot more than if she was exercising on her own. "      Expected Outcomes AmShandrikaill continue to come to exercise and education classes.  We will continue to monitor for progression. Cont heart healthy lifestyle         Core Components/Risk Factors/Patient Goals at Discharge (Final Review):      Goals and Risk Factor Review - 08/06/16 1409      Core Components/Risk Factors/Patient Goals Review   Review AmMahaliaaid her blood pressure when she first started on teh Brilinta was 100systolic but is better now at 120/70. AmRaushanahaid she "displaced her iliac joints so didn't use the treadmill last week but did use the other machines without any problems. AmYanelleeports that her stamina has gotten better since "she pushes her self alot more than if she was exercising on her own. "   Expected Outcomes Cont heart healthy lifestyle      ITP Comments:     ITP Comments    Row Name 06/21/16 1438 06/21/16 1442 06/28/16 1812 07/18/16 0709 08/15/16 0633   ITP Comments AmAlthiaalks with a cane. She uses a rolling walker at homes at times. AmAlexianaeports  history that she tripped over something and fell and bruised her leg. 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 Oneika also doesn't feel that she needs to meet individually with the Cardiac REhab REgisterd Dietician. "I know what to eat" Thurza reports that she has a family history of depression. Delinda said she started herself on antidepressants after she asked her husband to leave and she was raising 3 children alone plus private MD practice. First full day of exercise!  Patient was oriented to gym and equipment including functions, settings, policies, and procedures.  Patient's individual  exercise prescription and treatment plan were reviewed.  All starting workloads were established based on the results of the 6 minute walk test done at initial orientation visit.  The plan for exercise progression was also introduced and progression will be customized based on patient's performance and goals. 30 day review. Continue with ITP unless changes noted by Medical Director at signature of review.  New to program 30 day review completed for Medical Director physician review and signature. Continue ITP unless changes made by physician.   [place] Name 08/16/16 1719 08/20/16 1746 08/23/16 1423 09/06/16 1119 09/12/16 0631   ITP Comments Patient tolerated exercise well. She did report that she has noticed some irregular heart beats with no pain or symptoms associated with this. She did show some sporadic irregular beats on her EKG strip during exercise. She was advised to talk with her doctor and she stated that she would call him.  Tifani reports that her MD has ordered a heart monitor for her since she has been feeliing some irregular beats. Blaize called today and said she will be out a little bit since she reports that MD wants to find out what is going on with her heart.  Niurka has not attended since 08/20/16. 30 day review completed for review by Dr Emily Filbert.  Continue with ITP unless changes noted by Dr Sabra Heck. Remains absent. Has a 30 day monitor on.      Comments:

## 2016-09-13 DIAGNOSIS — Z955 Presence of coronary angioplasty implant and graft: Secondary | ICD-10-CM | POA: Diagnosis not present

## 2016-09-13 DIAGNOSIS — Z9861 Coronary angioplasty status: Secondary | ICD-10-CM

## 2016-09-13 NOTE — Progress Notes (Signed)
Daily Session Note  Patient Details  Name: Charlotte Wagner MRN: [id number] Date of Birth: February 01, 1936 Referring Provider:   Flowsheet Row Cardiac Rehab from 06/21/2016 in [facility] Cardiac and Pulmonary Rehab  Referring Provider  Kathlyn Sacramento MD      Encounter Date: 09/13/2016  Check In:     Session Check In - 09/13/16 1721      Check-In   Location [facility]-Cardiac & Pulmonary Rehab   Staff Present Earlean Shawl, BS, ACSM CEP, Exercise Physiologist;Kayonna Oletta Darter, BA, ACSM CEP, Exercise Physiologist;Other  Jena Gauss RN   Supervising physician immediately available to respond to emergencies See telemetry face sheet for immediately available ER MD   Medication changes reported     No   Fall or balance concerns reported    No   Warm-up and Cool-down Performed on first and last piece of equipment   Resistance Training Performed Yes   VAD Patient? No     Pain Assessment   Currently in Pain? No/denies         Goals Met:  Independence with exercise equipment Exercise tolerated well No report of cardiac concerns or symptoms Strength training completed today  Goals Unmet:  Not Applicable  Comments: Pt able to follow exercise prescription today without complaint.  Will continue to monitor for progression.    Dr. Emily Filbert is Medical Director for [place] and [facility].

## 2016-09-17 ENCOUNTER — Encounter: Payer: Medicare Other | Admitting: *Deleted

## 2016-09-17 DIAGNOSIS — Z955 Presence of coronary angioplasty implant and graft: Secondary | ICD-10-CM | POA: Diagnosis not present

## 2016-09-17 DIAGNOSIS — Z9861 Coronary angioplasty status: Secondary | ICD-10-CM

## 2016-09-17 NOTE — Progress Notes (Signed)
Daily Session Note  Patient Details  Name: Charlotte Wagner MRN: [id number] Date of Birth: 07-28-1936 Referring Provider:   Flowsheet Row Cardiac Rehab from 06/21/2016 in [facility] Cardiac and Pulmonary Rehab  Referring Provider  Kathlyn Sacramento MD      Encounter Date: 09/17/2016  Check In:     Session Check In - 09/17/16 1647      Check-In   Location [facility]-Cardiac & Pulmonary Rehab   Staff Present Heath Lark, RN, BSN, CCRP;Carroll Enterkin, RN, Moises Blood, BS, ACSM CEP, Exercise Physiologist   Supervising physician immediately available to respond to emergencies See telemetry face sheet for immediately available ER MD   Medication changes reported     No   Fall or balance concerns reported    No   Warm-up and Cool-down Performed on first and last piece of equipment   Resistance Training Performed Yes   VAD Patient? No     Pain Assessment   Currently in Pain? No/denies   Multiple Pain Sites No         Goals Met:  Independence with exercise equipment Exercise tolerated well No report of cardiac concerns or symptoms Strength training completed today  Goals Unmet:  Not Applicable  Comments: Pt able to follow exercise prescription today without complaint.  Will continue to monitor for progression.    Dr. Emily Filbert is Medical Director for [place] and [facility].

## 2016-09-20 DIAGNOSIS — Z955 Presence of coronary angioplasty implant and graft: Secondary | ICD-10-CM | POA: Diagnosis not present

## 2016-09-20 DIAGNOSIS — Z9861 Coronary angioplasty status: Secondary | ICD-10-CM

## 2016-09-20 NOTE — Progress Notes (Signed)
Daily Session Note  Patient Details  Name: Charlotte Wagner MRN: [id number] Date of Birth: October 26, 1935 Referring Provider:   Flowsheet Row Cardiac Rehab from 06/21/2016 in [facility] Cardiac and Pulmonary Rehab  Referring Provider  Kathlyn Sacramento MD      Encounter Date: 09/20/2016  Check In:     Session Check In - 09/20/16 1713      Check-In   Location [facility]-Cardiac & Pulmonary Rehab   Staff Present Earlean Shawl, BS, ACSM CEP, Exercise Physiologist;Vendetta Oletta Darter, BA, ACSM CEP, Exercise Physiologist;Other  Jena Gauss RN   Supervising physician immediately available to respond to emergencies See telemetry face sheet for immediately available ER MD   Medication changes reported     No   Fall or balance concerns reported    No   Warm-up and Cool-down Performed on first and last piece of equipment   Resistance Training Performed Yes   VAD Patient? No     Pain Assessment   Currently in Pain? No/denies           Exercise Prescription Changes - 09/20/16 1100      Response to Exercise   Blood Pressure (Admit) 126/64   Blood Pressure (Exercise) 144/72   Blood Pressure (Exit) 120/58   Heart Rate (Admit) 78 bpm   Heart Rate (Exercise) 81 bpm   Heart Rate (Exit) 64 bpm   Rating of Perceived Exertion (Exercise) 12   Duration Progress to 45 minutes of aerobic exercise without signs/symptoms of physical distress   Intensity THRR unchanged     Progression   Progression Continue to progress workloads to maintain intensity without signs/symptoms of physical distress.   Average METs 2.55     Resistance Training   Training Prescription Yes   Weight 3   Reps 10-12     Interval Training   Interval Training No     NuStep   Level 6   Minutes 15   METs 3.1     Biostep-RELP   Level 3   Minutes 15   METs 2      Goals Met:  Independence with exercise equipment Exercise tolerated well No report of cardiac concerns or symptoms Strength training completed today  Goals Unmet:   Not Applicable  Comments: Pt able to follow exercise prescription today without complaint.  Will continue to monitor for progression.    Dr. Emily Filbert is Medical Director for [place] and [facility].

## 2016-09-24 DIAGNOSIS — Z955 Presence of coronary angioplasty implant and graft: Secondary | ICD-10-CM | POA: Diagnosis not present

## 2016-09-24 DIAGNOSIS — Z9861 Coronary angioplasty status: Secondary | ICD-10-CM

## 2016-09-24 NOTE — Progress Notes (Signed)
Daily Session Note  Patient Details  Name: Charlotte Wagner MRN: [id number] Date of Birth: 04/09/1936 Referring Provider:   Flowsheet Row Cardiac Rehab from 06/21/2016 in [facility] Cardiac and Pulmonary Rehab  Referring Provider  Kathlyn Sacramento MD      Encounter Date: 09/24/2016  Check In:     Session Check In - 09/24/16 1753      Check-In   Location [facility]-Cardiac & Pulmonary Rehab   Staff Present Heath Lark, RN, BSN, CCRP;Noor Vidales, DPT, Ronaldo Miyamoto, BS, ACSM CEP, Exercise Physiologist   Supervising physician immediately available to respond to emergencies See telemetry face sheet for immediately available ER MD   Medication changes reported     No   Fall or balance concerns reported    No   Warm-up and Cool-down Performed on first and last piece of equipment   Resistance Training Performed Yes   VAD Patient? No     Pain Assessment   Currently in Pain? No/denies         Goals Met:  Independence with exercise equipment  Goals Unmet:  Not Applicable  Comments: Patient completed exercise prescription and all exercise goals during rehab session. The exercise was tolerated well and the patient is progressing in the program.    Dr. Emily Filbert is Medical Director for [place] and [facility].

## 2016-09-27 DIAGNOSIS — Z9861 Coronary angioplasty status: Secondary | ICD-10-CM

## 2016-09-27 DIAGNOSIS — Z955 Presence of coronary angioplasty implant and graft: Secondary | ICD-10-CM | POA: Diagnosis not present

## 2016-09-27 NOTE — Progress Notes (Signed)
Daily Session Note  Patient Details  Name: Shawnika D Cromartie MRN: [id number] Date of Birth: 03/16/1936 Referring Provider:   Flowsheet Row Cardiac Rehab from 06/21/2016 in [facility] Cardiac and Pulmonary Rehab  Referring Provider  Arida, Muhammad MD      Encounter Date: 09/27/2016  Check In:     Session Check In - 09/27/16 1704      Check-In   Location [facility]-Cardiac & Pulmonary Rehab   Staff Present Kelly Hayes, BS, ACSM CEP, Exercise Physiologist;Tenisha Sommer, BA, ACSM CEP, Exercise Physiologist;Patricia Surles RN BSN   Supervising physician immediately available to respond to emergencies See telemetry face sheet for immediately available ER MD   Medication changes reported     No   Fall or balance concerns reported    No   Warm-up and Cool-down Performed on first and last piece of equipment   Resistance Training Performed Yes   VAD Patient? No     Pain Assessment   Currently in Pain? No/denies         Goals Met:  Independence with exercise equipment Exercise tolerated well No report of cardiac concerns or symptoms Strength training completed today  Goals Unmet:  Not Applicable  Comments: Pt able to follow exercise prescription today without complaint.  Will continue to monitor for progression.    Dr. Mark Miller is Medical Director for [facility] and [facility]. 

## 2016-10-05 ENCOUNTER — Telehealth: Payer: Self-pay | Admitting: Internal Medicine

## 2016-10-05 DIAGNOSIS — Z8709 Personal history of other diseases of the respiratory system: Secondary | ICD-10-CM | POA: Diagnosis not present

## 2016-10-05 DIAGNOSIS — J208 Acute bronchitis due to other specified organisms: Secondary | ICD-10-CM | POA: Diagnosis not present

## 2016-10-05 NOTE — Telephone Encounter (Signed)
Pt called and stated that she has bronchitis. She is c/o cough with yellow/green phlegm and head congestion. Please advise, thank you!  Call [phone]

## 2016-10-05 NOTE — Telephone Encounter (Signed)
Reason for call: ? bronchitis  Symptoms:cough , with yellow green phlegm this am, wheezing at times  Duration  4 days  Medications: Advair, Atrovent today  Mucinex  DM  Has been exposed to grandson who had croup Please advise.

## 2016-10-05 NOTE — Telephone Encounter (Signed)
With her history, she needs to be evaluated to confirm definite diagnosis.  If no appts here, then to acute care and then we can f/u here.

## 2016-10-05 NOTE — Telephone Encounter (Signed)
Patient advised of below and verbalized and understanding  

## 2016-10-08 ENCOUNTER — Emergency Department: Payer: Medicare Other

## 2016-10-08 ENCOUNTER — Other Ambulatory Visit: Payer: Self-pay

## 2016-10-08 ENCOUNTER — Inpatient Hospital Stay
Admission: EM | Admit: 2016-10-08 | Discharge: 2016-10-10 | DRG: 291 | Disposition: A | Discharge status: Home / Self Care | Payer: Medicare Other | Attending: Internal Medicine | Admitting: Internal Medicine

## 2016-10-08 DIAGNOSIS — Z96642 Presence of left artificial hip joint: Secondary | ICD-10-CM | POA: Diagnosis present

## 2016-10-08 DIAGNOSIS — Z8711 Personal history of peptic ulcer disease: Secondary | ICD-10-CM

## 2016-10-08 DIAGNOSIS — I4891 Unspecified atrial fibrillation: Secondary | ICD-10-CM

## 2016-10-08 DIAGNOSIS — Z955 Presence of coronary angioplasty implant and graft: Secondary | ICD-10-CM | POA: Diagnosis not present

## 2016-10-08 DIAGNOSIS — I10 Essential (primary) hypertension: Secondary | ICD-10-CM | POA: Diagnosis present

## 2016-10-08 DIAGNOSIS — I130 Hypertensive heart and chronic kidney disease with heart failure and stage 1 through stage 4 chronic kidney disease, or unspecified chronic kidney disease: Principal | ICD-10-CM | POA: Diagnosis present

## 2016-10-08 DIAGNOSIS — Z951 Presence of aortocoronary bypass graft: Secondary | ICD-10-CM

## 2016-10-08 DIAGNOSIS — I25118 Atherosclerotic heart disease of native coronary artery with other forms of angina pectoris: Secondary | ICD-10-CM | POA: Diagnosis present

## 2016-10-08 DIAGNOSIS — I480 Paroxysmal atrial fibrillation: Secondary | ICD-10-CM | POA: Diagnosis present

## 2016-10-08 DIAGNOSIS — R0603 Acute respiratory distress: Secondary | ICD-10-CM

## 2016-10-08 DIAGNOSIS — I4819 Other persistent atrial fibrillation: Secondary | ICD-10-CM

## 2016-10-08 DIAGNOSIS — R002 Palpitations: Secondary | ICD-10-CM | POA: Diagnosis present

## 2016-10-08 DIAGNOSIS — Z96653 Presence of artificial knee joint, bilateral: Secondary | ICD-10-CM | POA: Diagnosis present

## 2016-10-08 DIAGNOSIS — R05 Cough: Secondary | ICD-10-CM | POA: Diagnosis not present

## 2016-10-08 DIAGNOSIS — I509 Heart failure, unspecified: Secondary | ICD-10-CM

## 2016-10-08 DIAGNOSIS — Z79899 Other long term (current) drug therapy: Secondary | ICD-10-CM

## 2016-10-08 DIAGNOSIS — Z85820 Personal history of malignant melanoma of skin: Secondary | ICD-10-CM

## 2016-10-08 DIAGNOSIS — Z7902 Long term (current) use of antithrombotics/antiplatelets: Secondary | ICD-10-CM | POA: Diagnosis not present

## 2016-10-08 DIAGNOSIS — F329 Major depressive disorder, single episode, unspecified: Secondary | ICD-10-CM | POA: Diagnosis present

## 2016-10-08 DIAGNOSIS — J45901 Unspecified asthma with (acute) exacerbation: Secondary | ICD-10-CM | POA: Diagnosis present

## 2016-10-08 DIAGNOSIS — E785 Hyperlipidemia, unspecified: Secondary | ICD-10-CM | POA: Diagnosis present

## 2016-10-08 DIAGNOSIS — N181 Chronic kidney disease, stage 1: Secondary | ICD-10-CM | POA: Diagnosis present

## 2016-10-08 DIAGNOSIS — Z87891 Personal history of nicotine dependence: Secondary | ICD-10-CM

## 2016-10-08 DIAGNOSIS — J40 Bronchitis, not specified as acute or chronic: Secondary | ICD-10-CM | POA: Diagnosis present

## 2016-10-08 DIAGNOSIS — I481 Persistent atrial fibrillation: Secondary | ICD-10-CM | POA: Diagnosis not present

## 2016-10-08 DIAGNOSIS — F419 Anxiety disorder, unspecified: Secondary | ICD-10-CM | POA: Diagnosis present

## 2016-10-08 DIAGNOSIS — R0600 Dyspnea, unspecified: Secondary | ICD-10-CM | POA: Diagnosis present

## 2016-10-08 DIAGNOSIS — I110 Hypertensive heart disease with heart failure: Secondary | ICD-10-CM | POA: Diagnosis not present

## 2016-10-08 DIAGNOSIS — I2510 Atherosclerotic heart disease of native coronary artery without angina pectoris: Secondary | ICD-10-CM | POA: Diagnosis not present

## 2016-10-08 DIAGNOSIS — R0602 Shortness of breath: Secondary | ICD-10-CM

## 2016-10-08 DIAGNOSIS — R001 Bradycardia, unspecified: Secondary | ICD-10-CM | POA: Diagnosis not present

## 2016-10-08 DIAGNOSIS — I5033 Acute on chronic diastolic (congestive) heart failure: Secondary | ICD-10-CM | POA: Diagnosis present

## 2016-10-08 DIAGNOSIS — K219 Gastro-esophageal reflux disease without esophagitis: Secondary | ICD-10-CM | POA: Diagnosis present

## 2016-10-08 DIAGNOSIS — E871 Hypo-osmolality and hyponatremia: Secondary | ICD-10-CM

## 2016-10-08 HISTORY — DX: Acute on chronic diastolic (congestive) heart failure: I50.33

## 2016-10-08 LAB — COMPREHENSIVE METABOLIC PANEL
ALK PHOS: 68 U/L (ref 38–126)
ALT: 22 U/L (ref 14–54)
ANION GAP: 9 (ref 5–15)
AST: 33 U/L (ref 15–41)
Albumin: 4 g/dL (ref 3.5–5.0)
BILIRUBIN TOTAL: 0.7 mg/dL (ref 0.3–1.2)
BUN: 14 mg/dL (ref 6–20)
CALCIUM: 9.1 mg/dL (ref 8.9–10.3)
CO2: 22 mmol/L (ref 22–32)
Chloride: 94 mmol/L — ABNORMAL LOW (ref 101–111)
Creatinine, Ser: 0.95 mg/dL (ref 0.44–1.00)
GFR calc non Af Amer: 56 mL/min — ABNORMAL LOW (ref >60)
Glucose, Bld: 142 mg/dL — ABNORMAL HIGH (ref 65–99)
POTASSIUM: 4.2 mmol/L (ref 3.5–5.1)
Sodium: 125 mmol/L — ABNORMAL LOW (ref 135–145)
TOTAL PROTEIN: 6.9 g/dL (ref 6.5–8.1)

## 2016-10-08 LAB — CREATININE, SERUM
CREATININE: 1 mg/dL (ref 0.44–1.00)
GFR calc Af Amer: >60 mL/min (ref >60)
GFR calc non Af Amer: 53 mL/min — ABNORMAL LOW (ref >60)

## 2016-10-08 LAB — CBC WITH DIFFERENTIAL/PLATELET
BASOS PCT: 0 %
Basophils Absolute: 0 10*3/uL (ref 0–0.1)
Eosinophils Absolute: 0 10*3/uL (ref 0–0.7)
Eosinophils Relative: 1 %
HEMATOCRIT: 35 % (ref 35.0–47.0)
Hemoglobin: 12.2 g/dL (ref 12.0–16.0)
LYMPHS ABS: 0.5 10*3/uL — AB (ref 1.0–3.6)
LYMPHS PCT: 13 %
MCH: 34.5 pg — ABNORMAL HIGH (ref 26.0–34.0)
MCHC: 34.7 g/dL (ref 32.0–36.0)
MCV: 99.3 fL (ref 80.0–100.0)
MONOS PCT: 17 %
Monocytes Absolute: 0.6 10*3/uL (ref 0.2–0.9)
NEUTROS ABS: 2.5 10*3/uL (ref 1.4–6.5)
Neutrophils Relative %: 69 %
Platelets: 202 10*3/uL (ref 150–440)
RBC: 3.52 MIL/uL — ABNORMAL LOW (ref 3.80–5.20)
RDW: 12.7 % (ref 11.5–14.5)
WBC: 3.6 10*3/uL (ref 3.6–11.0)

## 2016-10-08 LAB — PROTIME-INR
INR: 0.99
Prothrombin Time: 13.1 seconds (ref 11.4–15.2)

## 2016-10-08 LAB — TROPONIN I: Troponin I: <0.03 ng/mL (ref <0.03)

## 2016-10-08 LAB — BRAIN NATRIURETIC PEPTIDE: B Natriuretic Peptide: 566 pg/mL — ABNORMAL HIGH (ref 0.0–100.0)

## 2016-10-08 LAB — APTT: APTT: 35 s (ref 24–36)

## 2016-10-08 MED ORDER — BISOPROLOL FUMARATE 5 MG PO TABS
2.5000 mg | ORAL_TABLET | Freq: Every day | ORAL | Status: DC
  Administered 2016-10-08 – 2016-10-09 (×2): 2.5 mg via ORAL
  Filled 2016-10-08 (×2): qty 1

## 2016-10-08 MED ORDER — VERAPAMIL HCL ER 240 MG PO TBCR
240.0000 mg | EXTENDED_RELEASE_TABLET | Freq: Every day | ORAL | Status: DC
  Administered 2016-10-08: 240 mg via ORAL
  Filled 2016-10-08: qty 1

## 2016-10-08 MED ORDER — ACETAMINOPHEN 650 MG RE SUPP: 650.0000 mg | Freq: Four times a day (QID) | RECTAL | Status: DC | PRN

## 2016-10-08 MED ORDER — MECLIZINE HCL 25 MG PO TABS: 25.0000 mg | ORAL_TABLET | Freq: Three times a day (TID) | ORAL | Status: DC | PRN

## 2016-10-08 MED ORDER — ACETAMINOPHEN 325 MG PO TABS: 650.0000 mg | ORAL_TABLET | Freq: Four times a day (QID) | ORAL | Status: DC | PRN

## 2016-10-08 MED ORDER — NITROGLYCERIN 0.4 MG SL SUBL: 0.4000 mg | SUBLINGUAL_TABLET | SUBLINGUAL | Status: DC | PRN

## 2016-10-08 MED ORDER — RENA-VITE PO TABS
1.0000 | ORAL_TABLET | Freq: Every day | ORAL | Status: DC
  Administered 2016-10-09 – 2016-10-10 (×2): 1 via ORAL
  Filled 2016-10-08 (×2): qty 1

## 2016-10-08 MED ORDER — MOMETASONE FURO-FORMOTEROL FUM 200-5 MCG/ACT IN AERO
2.0000 | INHALATION_SPRAY | Freq: Two times a day (BID) | RESPIRATORY_TRACT | Status: DC
  Administered 2016-10-08 – 2016-10-10 (×4): 2 via RESPIRATORY_TRACT
  Filled 2016-10-08: qty 8.8

## 2016-10-08 MED ORDER — FUROSEMIDE 10 MG/ML IJ SOLN
40.0000 mg | Freq: Two times a day (BID) | INTRAMUSCULAR | Status: DC
Start: 2016-10-09 — End: 2016-10-09
  Administered 2016-10-09: 40 mg via INTRAVENOUS
  Filled 2016-10-08 (×2): qty 4

## 2016-10-08 MED ORDER — IPRATROPIUM BROMIDE 0.02 % IN SOLN
2.5000 mL | RESPIRATORY_TRACT | Status: DC | PRN
  Administered 2016-10-08: 0.5 mg via RESPIRATORY_TRACT
  Filled 2016-10-08: qty 2.5

## 2016-10-08 MED ORDER — VITAMIN C 500 MG PO TABS
500.0000 mg | ORAL_TABLET | Freq: Every day | ORAL | Status: DC
  Administered 2016-10-09: 500 mg via ORAL
  Filled 2016-10-08: qty 1

## 2016-10-08 MED ORDER — FUROSEMIDE 10 MG/ML IJ SOLN
40.0000 mg | Freq: Once | INTRAMUSCULAR | Status: AC
  Administered 2016-10-08: 40 mg via INTRAVENOUS
  Filled 2016-10-08: qty 4

## 2016-10-08 MED ORDER — PANTOPRAZOLE SODIUM 40 MG PO TBEC
40.0000 mg | DELAYED_RELEASE_TABLET | Freq: Two times a day (BID) | ORAL | Status: DC
  Administered 2016-10-08 – 2016-10-10 (×4): 40 mg via ORAL
  Filled 2016-10-08 (×4): qty 1

## 2016-10-08 MED ORDER — SODIUM CHLORIDE 0.9% FLUSH
3.0000 mL | Freq: Two times a day (BID) | INTRAVENOUS | Status: DC
  Administered 2016-10-08 – 2016-10-10 (×5): 3 mL via INTRAVENOUS

## 2016-10-08 MED ORDER — LORATADINE 10 MG PO TABS
10.0000 mg | ORAL_TABLET | Freq: Every day | ORAL | Status: DC
  Administered 2016-10-09 – 2016-10-10 (×2): 10 mg via ORAL
  Filled 2016-10-08 (×2): qty 1

## 2016-10-08 MED ORDER — TRAZODONE HCL 50 MG PO TABS
225.0000 mg | ORAL_TABLET | Freq: Every evening | ORAL | Status: DC | PRN
  Administered 2016-10-08: 22:00:00 225 mg via ORAL
  Filled 2016-10-08: qty 5

## 2016-10-08 MED ORDER — TRAZODONE HCL 50 MG PO TABS: 50.0000 mg | ORAL_TABLET | Freq: Every evening | ORAL | Status: DC | PRN

## 2016-10-08 MED ORDER — ONDANSETRON HCL 4 MG PO TABS: 4.0000 mg | ORAL_TABLET | Freq: Four times a day (QID) | ORAL | Status: DC | PRN

## 2016-10-08 MED ORDER — SUCRALFATE 1 G PO TABS
1.0000 g | ORAL_TABLET | Freq: Two times a day (BID) | ORAL | Status: DC | PRN
  Administered 2016-10-10: 1 g via ORAL
  Filled 2016-10-08: qty 1

## 2016-10-08 MED ORDER — TICAGRELOR 90 MG PO TABS
90.0000 mg | ORAL_TABLET | Freq: Two times a day (BID) | ORAL | Status: DC
  Administered 2016-10-08: 90 mg via ORAL
  Filled 2016-10-08 (×2): qty 1

## 2016-10-08 MED ORDER — HYDROCORTISONE 2.5 % EX CREA
TOPICAL_CREAM | Freq: Two times a day (BID) | CUTANEOUS | Status: DC | PRN
  Filled 2016-10-08: qty 30

## 2016-10-08 MED ORDER — ONDANSETRON HCL 4 MG/2ML IJ SOLN: 4.0000 mg | Freq: Four times a day (QID) | INTRAMUSCULAR | Status: DC | PRN

## 2016-10-08 MED ORDER — EZETIMIBE 10 MG PO TABS
10.0000 mg | ORAL_TABLET | Freq: Every day | ORAL | Status: DC
  Administered 2016-10-08 – 2016-10-10 (×3): 10 mg via ORAL
  Filled 2016-10-08 (×3): qty 1

## 2016-10-08 MED ORDER — VITAMIN B-12 1000 MCG PO TABS
1000.0000 ug | ORAL_TABLET | Freq: Every day | ORAL | Status: DC
  Administered 2016-10-09 – 2016-10-10 (×2): 1000 ug via ORAL
  Filled 2016-10-08 (×2): qty 1

## 2016-10-08 MED ORDER — TRAMADOL HCL 50 MG PO TABS: 50.0000 mg | ORAL_TABLET | Freq: Every day | ORAL | Status: DC | PRN

## 2016-10-08 MED ORDER — RAMIPRIL 10 MG PO CAPS
10.0000 mg | ORAL_CAPSULE | Freq: Every day | ORAL | Status: DC
  Administered 2016-10-08: 10 mg via ORAL
  Filled 2016-10-08 (×2): qty 1

## 2016-10-08 MED ORDER — AMITRIPTYLINE HCL 10 MG PO TABS
25.0000 mg | ORAL_TABLET | Freq: Every day | ORAL | Status: DC
  Administered 2016-10-08 – 2016-10-09 (×2): 25 mg via ORAL
  Filled 2016-10-08 (×2): qty 3

## 2016-10-08 MED ORDER — VERAPAMIL HCL ER 180 MG PO TBCR
180.0000 mg | EXTENDED_RELEASE_TABLET | Freq: Every morning | ORAL | Status: DC
Start: 2016-10-09 — End: 2016-10-10
  Administered 2016-10-09 – 2016-10-10 (×2): 180 mg via ORAL
  Filled 2016-10-08 (×2): qty 1

## 2016-10-08 MED ORDER — ENOXAPARIN SODIUM 40 MG/0.4ML ~~LOC~~ SOLN
40.0000 mg | SUBCUTANEOUS | Status: DC
  Administered 2016-10-08: 40 mg via SUBCUTANEOUS
  Filled 2016-10-08: qty 0.4

## 2016-10-08 MED ORDER — FOLIC ACID 1 MG PO TABS
1.0000 mg | ORAL_TABLET | Freq: Every day | ORAL | Status: DC
  Administered 2016-10-09 – 2016-10-10 (×2): 1 mg via ORAL
  Filled 2016-10-08 (×2): qty 1

## 2016-10-08 MED ORDER — OMEGA-3-ACID ETHYL ESTERS 1 G PO CAPS
1.0000 g | ORAL_CAPSULE | Freq: Every day | ORAL | Status: DC
  Administered 2016-10-09: 1 g via ORAL
  Filled 2016-10-08: qty 1

## 2016-10-08 NOTE — ED Triage Notes (Signed)
Pt reports she is shortness of breath - In August had stent placed, one week later had chest pain then started new medication - last Saturday she got laryngitis and bronchitis - this past Friday came to urgent care and was given Biaxin for bronchitis - sputum has cleared but pt reports becoming more short of breath - pt is a retired Engineer, drilling

## 2016-10-08 NOTE — ED Provider Notes (Addendum)
[facility] Emergency Department Provider Note  ____________________________________________   I have reviewed the triage vital signs and the nursing notes.   HISTORY  Chief Complaint Shortness of Breath    HPI Charlotte Wagner is a 81 y.o. female who presents to the complaining of shortness of breath. Getting progressively worse over the last week. She has had a URI but that is nearly gone. However, she states that she is having orthopnea, mild leg swelling, and increasing shortness of breath with exertion. The patient has had no fevers. No worsening cough and thought her cough is much better. Patient takes relented. She feels that some of this could be because of the shortness of breath associated with Brillinta cross reacting with the antibiotic she was recently on. The patient is a retired Engineer, drilling. Patient states he did ambulate at her [place] she [place] 80s. She is not on oxygen. And she does not have a history she notes that of atrial fibrillation.  Past Medical History:  Diagnosis Date  . Allergy   . Arthritis    s/p bilateral knees and left hip replacement  . Asthma   . CAD (coronary artery disease)    a. 12/1996 s/p CABG x4 ([place]);  b. 2005 Pt reports stress test & cath, which revealed patent grafts.  . Cancer ([employer])    melanoma right arm  . Carotid arterial disease ([place])    a. 04/2015 Carotid U/S: <50% bilat ICA stenosis.  . Chronic kidney disease   . Colon polyps    H/O  . Depression   . GERD (gastroesophageal reflux disease)    h/o hiatal hernia  . Headache    migraines in past  . Heart murmur    a. 04/2011 Echo: EF 55-60%, bilat atrial enlargement, mild to mod TR.  [name] History of chicken pox   . History of hiatal hernia   . Hx of migraines    rare now  . Hx: UTI (urinary tract infection)   . Hyperlipidemia    a. Statin intolerant -->on zetia.  . Hypertensive heart disease   . Lichen planus   .  Melanoma ([place])   . Palpitations    a. rare PVC's and h/o SVT.  [name] PMR (polymyalgia rheumatica) (HCC)    h/o in setting of crestor usage.  [name] PSVT (paroxysmal supraventricular tachycardia) ([place])   . PUD (peptic ulcer disease)    remote history  . Raynaud's phenomenon   . Spinal stenosis   . Urine incontinence    H/O  . Vitamin D deficiency     Patient Active Problem List   Diagnosis Date Noted  . SOB (shortness of breath) 06/07/2016  . Anxiety 06/07/2016  . Hypertensive heart disease   . Hyperlipidemia   . Angina pectoris ([place]) 05/31/2016  . Palpitations 05/29/2016  . Viral URI with cough 06/16/2015  . Left carotid bruit 04/18/2015  . Health care maintenance 04/18/2015  . UTI (urinary tract infection) 02/07/2015  . Viral syndrome 01/04/2015  . Dysuria 01/04/2015  . Dysphagia 12/21/2014  . Arm skin lesion, right 12/14/2013  . Acute bronchitis 10/18/2013  . CAD (coronary artery disease) 06/21/2013  . History of colonic polyps 06/21/2013  . Vitamin D deficiency 06/21/2013  . Osteoarthritis 06/21/2013  . Essential hypertension, benign 06/21/2013  . GERD (gastroesophageal reflux disease) 06/21/2013  . Hiatal hernia 06/21/2013  . Goiter 06/21/2013  . Lichen planus [id number]  . Depression 06/21/2013  . Hypercholesterolemia  06/21/2013  . Spinal stenosis 06/21/2013  . Asthma 06/21/2013  . Migraine 06/21/2013  . Frequent UTI 06/21/2013  . Hemorrhoids 06/21/2013    Past Surgical History:  Procedure Laterality Date  . ADENOIDECTOMY     age 57  . BACK SURGERY     L3-L5  . BILATERAL CARPAL TUNNEL RELEASE    . BREAST BIOPSY Right    bx x 3 neg  . BREAST SURGERY Right    biopsy x 3 (all benign)  . CARDIAC CATHETERIZATION N/A 06/01/2016   Procedure: LEFT HEART CATH AND CORS/GRAFTS ANGIOGRAPHY;  Surgeon: Wellington Hampshire, MD;  Location: [place]  CV LAB;  Service: Cardiovascular;  Laterality: N/A;  . CARDIAC CATHETERIZATION N/A 06/01/2016   Procedure: Coronary Stent  Intervention;  Surgeon: Wellington Hampshire, MD;  Location: [place] CV LAB;  Service: Cardiovascular;  Laterality: N/A;  . CATARACT EXTRACTION W/PHACO Right 01/04/2016   Procedure: CATARACT EXTRACTION PHACO AND INTRAOCULAR LENS PLACEMENT (IOC);  Surgeon: Leandrew Koyanagi, MD;  Location: [place];  Service: Ophthalmology;  Laterality: Right;  MALYUGIN  . CATARACT EXTRACTION W/PHACO Left 01/25/2016   Procedure: CATARACT EXTRACTION PHACO AND INTRAOCULAR LENS PLACEMENT ([place]) left eye;  Surgeon: Leandrew Koyanagi, MD;  Location: [place];  Service: Ophthalmology;  Laterality: Left;  MALYUGIN SHUGARCAINE  . CHOLECYSTECTOMY  90's  . COLONOSCOPY WITH PROPOFOL N/A 05/03/2015   Procedure: COLONOSCOPY WITH PROPOFOL;  Surgeon: Lollie Sails, MD;  [facility];  Service: Endoscopy;  Laterality: N/A;  . CORONARY ARTERY BYPASS GRAFT  98   4 vessel  . ESOPHAGOGASTRODUODENOSCOPY N/A 03/22/2015   Procedure: ESOPHAGOGASTRODUODENOSCOPY (EGD);  Surgeon: Lollie Sails, MD;  Location: [facility] ENDOSCOPY;  Service: Endoscopy;  Laterality: N/A;  . JOINT REPLACEMENT     BILATERAL KNEE REPLACEMENTS  . KNEE ARTHROSCOPY W/ [place] PROCEDURE     Lt knee (9/01), Rt knee (3/11), Lt hip (5/10)  . TOTAL HIP ARTHROPLASTY      Prior to Admission medications   Medication Sig Start Date End Date Taking? Authorizing Provider  acetaminophen (TYLENOL) 500 MG tablet Take 1,000 mg by mouth 2 (two) times daily.     Historical Provider, MD  amitriptyline (ELAVIL) 25 MG tablet Take 1-2 tablets (25-50 mg total) by mouth at bedtime. 09/06/16   [name], MD  b complex vitamins tablet Take 1 tablet by mouth daily.    Historical Provider, MD  bisoprolol (ZEBETA) 5 MG tablet Take 1 tablet (5 mg total) by mouth at bedtime. Patient taking differently: Take 2.5 mg by mouth at bedtime.  09/04/16   Wende Bushy, MD  BRILINTA 90 MG TABS tablet take 1 tablet by mouth twice a day 09/04/16   Wellington Hampshire, MD  Cholecalciferol (VITAMIN D3) 2000 UNITS TABS Take 1 capsule by mouth daily.     Historical Provider, MD  Coenzyme Q10 (COQ-10) 100 MG CAPS Take 1 tablet by mouth daily.    Historical Provider, MD  ezetimibe (ZETIA) 10 MG tablet take 1 tablet by mouth once daily 09/03/16   [name], MD  folic acid (FOLVITE) 1 MG tablet take 1 tablet by mouth once daily 06/12/16   [name], MD  hydrocortisone 2.5 % cream apply to VULVA twice a day AT 10AM AND 4 PM 02/06/16   [name], MD  ketoconazole (NIZORAL) 2 % cream Apply 1 application topically daily. Patient taking differently: Apply 1 application topically daily as needed for irritation.  06/15/14   [name], MD  loratadine (CLARITIN)  10 MG tablet Take 10 mg by mouth daily.    Historical Provider, MD  meclizine (ANTIVERT) 25 MG tablet Take 25 mg by mouth 3 (three) times daily as needed for dizziness.    Historical Provider, MD  miconazole (MICOTIN) 2 % cream Apply 1 application topically 2 (two) times daily as needed (irritation).    Historical Provider, MD  Multiple Vitamins-Minerals (HAIR/SKIN/NAILS PO) Take 1 tablet by mouth daily.    Historical Provider, MD  nitroGLYCERIN (NITROSTAT) 0.4 MG SL tablet Place 1 tablet (0.4 mg total) under the tongue every 5 (five) minutes as needed for chest pain. Place 1 tablet (0.4 mg total) under the tongue every 5 (five) minutes , 3 times as needed for chest pain. 06/07/16   Nicholes Mango, MD  Omega-3 Fatty Acids (FISH OIL PO) Take 2,000 mg by mouth daily.    Historical Provider, MD  pantoprazole (PROTONIX) 40 MG tablet take 1 tablet by mouth twice a day 08/13/16   [name], MD  Polyethylene Glycol 3350 (MIRALAX PO) Take 8.5 g by mouth at bedtime.     Historical Provider, MD  ramipril (ALTACE) 10 MG capsule take 1 capsule by mouth once daily 07/16/16   [name], MD  spironolactone (ALDACTONE) 25 MG tablet take 1 tablet by mouth every morning 08/31/16   [name], MD  sucralfate  (CARAFATE) 1 g tablet Take 1 g by mouth 2 (two) times daily as needed (acid reflux).    Historical Provider, MD  ticagrelor (BRILINTA) 90 MG TABS tablet Take 1 tablet (90 mg total) by mouth 2 (two) times daily. 09/03/16   Wende Bushy, MD  traMADol (ULTRAM) 50 MG tablet Take 1 tablet (50 mg total) by mouth daily as needed. Patient taking differently: Take 50 mg by mouth daily as needed for moderate pain.  08/16/15   [name], MD  traZODone (DESYREL) 150 MG tablet take 1 and 2/3 tablets to 2 tablets by mouth at bedtime as directed 08/10/16   [name], MD  verapamil (CALAN-SR) 240 MG CR tablet Take 1 tablet (240 mg total) by mouth 2 (two) times daily. 09/11/16   Wende Bushy, MD  vitamin B-12 (CYANOCOBALAMIN) 1000 MCG tablet Take 1,000 mcg by mouth daily.    Historical Provider, MD  vitamin C (ASCORBIC ACID) 500 MG tablet Take 500 mg by mouth at bedtime.    Historical Provider, MD    Allergies Beta adrenergic blockers; Albuterol; Aspirin; Penicillins; Statins; Sulfa antibiotics; Tetracycline; and Tetracyclines & related  Family History  Problem Relation Age of Onset  . Thyroid disease Mother     graves disease  . Heart disease Father     rheumatic heart  . Alcohol abuse Father   . Depression Father   . Lung cancer Sister   . Depression Daughter   . Thyroid disease Daughter     hashimoto  . Depression Son   . Stroke Maternal Grandmother   . Depression Maternal Grandmother   . Hypertension Maternal Grandfather   . Hypertension Paternal Grandfather   . Breast cancer Neg Hx     Social History Social History  Substance Use Topics  . Smoking status: Former Research scientist (life sciences)  . Smokeless tobacco: Never Used     Comment: quit 44+ yrs ago  . Alcohol use No    Review of Systems Constitutional: No fever/chills Eyes: No visual changes. ENT: No sore throat. No stiff neck no neck pain Cardiovascular: Denies chest pain. Respiratory: Positive shortness of breath. Gastrointestinal:   no  vomiting.  No diarrhea.  No constipation. Genitourinary: Negative for dysuria. Musculoskeletal: Positive mild lower extremity swelling Skin: Negative for rash. Neurological: Negative for severe headaches, focal weakness or numbness. 10-point ROS otherwise negative.  ____________________________________________   PHYSICAL EXAM:  VITAL SIGNS: ED Triage Vitals  Enc Vitals Group     BP 10/08/16 1520 (!) 153/62     Pulse Rate 10/08/16 1520 63     Resp 10/08/16 1520 (!) 22     Temp 10/08/16 1520 97.5 F (36.4 C)     Temp Source 10/08/16 1520 Oral     SpO2 10/08/16 1520 98 %     Weight 10/08/16 1515 192 lb (87.1 kg)     Height 10/08/16 1515 5\' 3"  (1.6 m)     Head Circumference --      Peak Flow --      Pain Score 10/08/16 1516 0     Pain Loc --      Pain Edu? --      Excl. in [place]? --     Constitutional: Alert and oriented. Well appearing and in no acute distress. Eyes: Conjunctivae are normal. PERRL. EOMI. Head: Atraumatic. Nose: No congestion/rhinnorhea. Mouth/Throat: Mucous membranes are moist.  Oropharynx non-erythematous. Neck: No stridor.   Nontender with no meningismus Cardiovascular: Normal rate, irregular rhythm. Grossly normal heart sounds.  Good peripheral circulation. Respiratory: Normal respiratory effort.  No retractions. Lungs CTAB. Abdominal: Soft and nontender. No distention. No guarding no rebound Back:  There is no focal tenderness or step off.  there is no midline tenderness there are no lesions noted. there is no CVA tenderness Musculoskeletal: No lower extremity tenderness, no upper extremity tenderness. No joint effusions, no DVT signs strong distal pulses mild bilateral edema Neurologic:  Normal speech and language. No gross focal neurologic deficits are appreciated.  Skin:  Skin is warm, dry and intact. No rash noted. Psychiatric: Mood and affect are normal. Speech and behavior are normal.  ____________________________________________   LABS (all labs  ordered are listed, but only abnormal results are displayed)  Labs Reviewed  CBC WITH DIFFERENTIAL/PLATELET - Abnormal; Notable for the following:       Result Value   RBC 3.52 (*)    MCH 34.5 (*)    Lymphs Abs 0.5 (*)    All other components within normal limits  COMPREHENSIVE METABOLIC PANEL - Abnormal; Notable for the following:    Sodium 125 (*)    Chloride 94 (*)    Glucose, Bld 142 (*)    GFR calc non Af Amer 56 (*)    All other components within normal limits  BRAIN NATRIURETIC PEPTIDE - Abnormal; Notable for the following:    B Natriuretic Peptide 566.0 (*)    All other components within normal limits  PROTIME-INR  APTT  TROPONIN I   ____________________________________________  EKG  I personally interpreted any EKGs ordered by me or triage Atrial fibrillation rate 80 bpm, no acute ST elevation or depression. ____________________________________________  [id number]  I reviewed any imaging ordered by me or triage that were performed during my shift and, if possible, patient and/or family made aware of any abnormal findings. ____________________________________________   PROCEDURES  Procedure(s) performed: None  Procedures  Critical Care performed: CRITICAL CARE Performed by: Schuyler Amor   Total critical care time: 38  minutes  Critical care time was exclusive of separately billable procedures and treating other patients.  Critical care was necessary to treat or prevent imminent or life-threatening deterioration.  Critical care  was time spent personally by me on the following activities: development of treatment plan with patient and/or surrogate as well as nursing, discussions with consultants, evaluation of patient's response to treatment, examination of patient, obtaining history from patient or surrogate, ordering and performing treatments and interventions, ordering and review of laboratory studies, ordering and review of radiographic studies, pulse  oximetry and re-evaluation of patient's condition.   ____________________________________________   INITIAL IMPRESSION / ASSESSMENT AND PLAN / ED COURSE  Pertinent labs & imaging results that were available during my care of the patient were reviewed by me and considered in my medical decision making (see chart for details).  Patient with several different issues today. The first is significant shortness of breath after she recovered from URI. She is currently in atrial fibrillation. This is not her baseline. She also has evidence of CHF by history and by BNP. It is likely that the patient has gone into atrial fibrillation and now suffering some forward flow issues. We will give her diuresis, her sodium was also 125, she does not feel safe to go home with the symptoms I do not think that she will do well on her own. We will admit her to the hospital for further evaluation.  Clinical Course    ____________________________________________   FINAL CLINICAL IMPRESSION(S) / ED DIAGNOSES  Final diagnoses:  SOB (shortness of breath)  Dyspnea, unspecified type  Atrial fibrillation, unspecified type ([place])  Hyponatremia      This chart was dictated using voice recognition software.  Despite best efforts to proofread,  errors can occur which can change meaning.      Schuyler Amor, MD 10/08/16 [place], MD 10/08/16 [id number]

## 2016-10-08 NOTE — H&P (Signed)
[facility] NAME: Charlotte Wagner    MR#:  [id number]  DATE OF BIRTH:  11-16-1935  DATE OF ADMISSION:  10/08/2016  PRIMARY CARE PHYSICIAN: [name], MD   REQUESTING/REFERRING PHYSICIAN: Dr. Charlotte Crumb  CHIEF COMPLAINT:   Chief Complaint  Patient presents with  . Shortness of Breath    HISTORY OF PRESENT ILLNESS:  Charlotte Wagner  is a 81 y.o. female with a known history of Coronary artery disease status post bypass, recent cardiac stent, osteoarthritis, asthma, coronary artery disease, chronic kidney disease, history of melanoma, history of supraventricular tachycardia, peptic ulcer disease, who presented to the hospital due to shortness of breath. Patient says that she's been feeling more short of breath even on minimal exertion. She denies any shortness of breath at rest. She admits to a 2 pillow orthopnea, and some mild paroxysmal nocturnal dyspnea. Patient does say that she has recently had a bout of bronchitis for which she is currently trying to finish up a course of clarithromycin but her symptoms have not improved. She had a recent cardiac stent placement in August of last year and is currently on Brilinta and thinks that her Pattricia Boss has caused a lot of her symptoms.  She wanted to get her anticoagulation switched from Brilinta to Plavix although her cardiologist did not want to do that presently. She presented to the emergency room and was noted to be in new onset atrial fibrillation also noted to be hyponatremic in CHF and hospitalist services were contacted further treatment and evaluation.  PAST MEDICAL HISTORY:   Past Medical History:  Diagnosis Date  . Allergy   . Arthritis    s/p bilateral knees and left hip replacement  . Asthma   . CAD (coronary artery disease)    a. 12/1996 s/p CABG x4 ([place]);  b. 2005 Pt reports stress test & cath, which revealed patent grafts.  . Cancer ([place])    melanoma right arm  .  Carotid arterial disease ([place])    a. 04/2015 Carotid U/S: <50% bilat ICA stenosis.  . Chronic kidney disease   . Colon polyps    H/O  . Depression   . GERD (gastroesophageal reflux disease)    h/o hiatal hernia  . Headache    migraines in past  . Heart murmur    a. 04/2011 Echo: EF 55-60%, bilat atrial enlargement, mild to mod TR.  [name] History of chicken pox   . History of hiatal hernia   . Hx of migraines    rare now  . Hx: UTI (urinary tract infection)   . Hyperlipidemia    a. Statin intolerant -->on zetia.  . Hypertensive heart disease   . Lichen planus   . Melanoma ([place])   . Palpitations    a. rare PVC's and h/o SVT.  [name] PMR (polymyalgia rheumatica) (H[facility])    h/o in setting of crestor usage.  [name] PSVT (paroxysmal supraventricular tachycardia) ([place])   . PUD (peptic ulcer disease)    remote history  . Raynaud's phenomenon   . Spinal stenosis   . Urine incontinence    H/O  . Vitamin D deficiency     PAST SURGICAL HISTORY:   Past Surgical History:  Procedure Laterality Date  . ADENOIDECTOMY     age 34  . BACK SURGERY     L3-L5  . BILATERAL CARPAL TUNNEL RELEASE    . BREAST BIOPSY Right    bx x 3 neg  .  BREAST SURGERY Right    biopsy x 3 (all benign)  . CARDIAC CATHETERIZATION N/A 06/01/2016   Procedure: LEFT HEART CATH AND CORS/GRAFTS ANGIOGRAPHY;  Surgeon: Wellington Hampshire, MD;  Location: [facility];  Service: Cardiovascular;  Laterality: N/A;  . CARDIAC CATHETERIZATION N/A 06/01/2016   Procedure: Coronary Stent Intervention;  Surgeon: Wellington Hampshire, MD;  Location: [facility];  Service: Cardiovascular;  Laterality: N/A;  . CATARACT EXTRACTION W/PHACO Right 01/04/2016   Procedure: CATARACT EXTRACTION PHACO AND INTRAOCULAR LENS PLACEMENT (IOC);  Surgeon: Leandrew Koyanagi, MD;  Location: [facility];  Service: Ophthalmology;  Laterality: Right;  MALYUGIN  . CATARACT EXTRACTION W/PHACO Left 01/25/2016   Procedure: CATARACT EXTRACTION PHACO AND  INTRAOCULAR LENS PLACEMENT ([place]) left eye;  Surgeon: Leandrew Koyanagi, MD;  Location: [facility];  Service: Ophthalmology;  Laterality: Left;  MALYUGIN SHUGARCAINE  . CHOLECYSTECTOMY  90's  . COLONOSCOPY WITH PROPOFOL N/A 05/03/2015   Procedure: COLONOSCOPY WITH PROPOFOL;  Surgeon: Lollie Sails, MD;  Location: [facility];  Service: Endoscopy;  Laterality: N/A;  . CORONARY ARTERY BYPASS GRAFT  98   4 vessel  . ESOPHAGOGASTRODUODENOSCOPY N/A 03/22/2015   Procedure: ESOPHAGOGASTRODUODENOSCOPY (EGD);  Surgeon: Lollie Sails, MD;  Location: [facility] ENDOSCOPY;  Service: Endoscopy;  Laterality: N/A;  . JOINT REPLACEMENT     BILATERAL KNEE REPLACEMENTS  . KNEE ARTHROSCOPY W/ OATS PROCEDURE     Lt knee (9/01), Rt knee (3/11), Lt hip (5/10)  . TOTAL HIP ARTHROPLASTY      SOCIAL HISTORY:   Social History  Substance Use Topics  . Smoking status: Former Research scientist (life sciences)  . Smokeless tobacco: Never Used     Comment: quit 44+ yrs ago  . Alcohol use No    FAMILY HISTORY:   Family History  Problem Relation Age of Onset  . Thyroid disease Mother     graves disease  . Heart disease Father     rheumatic heart  . Alcohol abuse Father   . Depression Father   . Lung cancer Sister   . Depression Daughter   . Thyroid disease Daughter     hashimoto  . Depression Son   . Stroke Maternal Grandmother   . Depression Maternal Grandmother   . Hypertension Maternal Grandfather   . Hypertension Paternal Grandfather   . Breast cancer Neg Hx     DRUG ALLERGIES:   Allergies  Allergen Reactions  . Beta Adrenergic Blockers Shortness Of Breath  . Albuterol     rigors  . Aspirin Other (See Comments)    Heartburn   . Penicillins Hives and Other (See Comments)    Has patient had a PCN reaction causing immediate rash, facial/tongue/throat swelling, SOB or lightheadedness with hypotension: No Has patient had a PCN reaction causing severe rash involving mucus membranes or skin necrosis: No Has  patient had a PCN reaction that required hospitalization No Has patient had a PCN reaction occurring within the last 10 years: No If all of the above answers are "NO", then may proceed with Cephalosporin use.   . Statins Other (See Comments)    Muscle weakness  . Sulfa Antibiotics Rash    At mouth  . Tetracycline Rash  . Tetracyclines & Related Rash    REVIEW OF SYSTEMS:   Review of Systems  Constitutional: Negative for chills and fever.  HENT: Negative for congestion and tinnitus.   Eyes: Negative for blurred vision and double vision.  Respiratory: Positive for cough and shortness of breath.  Negative for wheezing.   Cardiovascular: Positive for leg swelling. Negative for chest pain, orthopnea and PND.  Gastrointestinal: Negative for abdominal pain, diarrhea, nausea and vomiting.  Genitourinary: Negative for dysuria and hematuria.  Neurological: Negative for dizziness, sensory change and focal weakness.  All other systems reviewed and are negative.   MEDICATIONS AT HOME:   Prior to Admission medications   Medication Sig Start Date End Date Taking? Authorizing Provider  acetaminophen (TYLENOL) 500 MG tablet Take 1,000 mg by mouth 2 (two) times daily.    Yes Historical Provider, MD  amitriptyline (ELAVIL) 25 MG tablet Take 1-2 tablets (25-50 mg total) by mouth at bedtime. Patient taking differently: Take 25 mg by mouth at bedtime.  09/06/16  Yes [name], MD  b complex vitamins tablet Take 1 tablet by mouth daily.   Yes Historical Provider, MD  bisoprolol (ZEBETA) 5 MG tablet Take 1 tablet (5 mg total) by mouth at bedtime. Patient taking differently: Take 2.5 mg by mouth at bedtime.  09/04/16  Yes Wende Bushy, MD  BRILINTA 90 MG TABS tablet take 1 tablet by mouth twice a day 09/04/16  Yes Wellington Hampshire, MD  Cholecalciferol (VITAMIN D3) 2000 UNITS TABS Take 1 capsule by mouth daily.    Yes Historical Provider, MD  Coenzyme Q10 (COQ-10) 100 MG CAPS Take 1 tablet by mouth  daily.   Yes Historical Provider, MD  ezetimibe (ZETIA) 10 MG tablet take 1 tablet by mouth once daily 09/03/16  Yes [name], MD  folic acid (FOLVITE) 1 MG tablet take 1 tablet by mouth once daily 06/12/16  Yes [name], MD  hydrocortisone 2.5 % cream apply to VULVA twice a day AT 10AM AND 4 PM 02/06/16  Yes [name], MD  ketoconazole (NIZORAL) 2 % cream Apply 1 application topically daily. Patient taking differently: Apply 1 application topically daily as needed for irritation.  06/15/14  Yes [name], MD  loratadine (CLARITIN) 10 MG tablet Take 10 mg by mouth daily.   Yes Historical Provider, MD  meclizine (ANTIVERT) 25 MG tablet Take 25 mg by mouth 3 (three) times daily as needed for dizziness.   Yes Historical Provider, MD  miconazole (MICOTIN) 2 % cream Apply 1 application topically 2 (two) times daily as needed (irritation).   Yes Historical Provider, MD  Multiple Vitamins-Minerals (HAIR/SKIN/NAILS PO) Take 1 tablet by mouth daily.   Yes Historical Provider, MD  nitroGLYCERIN (NITROSTAT) 0.4 MG SL tablet Place 1 tablet (0.4 mg total) under the tongue every 5 (five) minutes as needed for chest pain. Place 1 tablet (0.4 mg total) under the tongue every 5 (five) minutes , 3 times as needed for chest pain. 06/07/16  Yes Nicholes Mango, MD  Omega-3 Fatty Acids (FISH OIL PO) Take 2,000 mg by mouth daily.   Yes Historical Provider, MD  pantoprazole (PROTONIX) 40 MG tablet take 1 tablet by mouth twice a day 08/13/16  Yes [name], MD  Polyethylene Glycol 3350 (MIRALAX PO) Take 8.5 g by mouth at bedtime.    Yes Historical Provider, MD  ramipril (ALTACE) 10 MG capsule take 1 capsule by mouth once daily 07/16/16  Yes [name], MD  spironolactone (ALDACTONE) 25 MG tablet take 1 tablet by mouth every morning 08/31/16  Yes [name], MD  sucralfate (CARAFATE) 1 g tablet Take 1 g by mouth 2 (two) times daily as needed (acid reflux).   Yes Historical Provider, MD  traMADol  (ULTRAM) 50 MG tablet Take 1 tablet (50 mg  total) by mouth daily as needed. Patient taking differently: Take 50 mg by mouth daily as needed for moderate pain.  08/16/15  Yes [name], MD  traZODone (DESYREL) 150 MG tablet take 1 and 2/3 tablets to 2 tablets by mouth at bedtime as directed 08/10/16  Yes [name], MD  verapamil (CALAN-SR) 240 MG CR tablet Take 1 tablet (240 mg total) by mouth 2 (two) times daily. Patient taking differently: Take 180-240 mg by mouth 2 (two) times daily. Take 180 mg in the morning and 240 mg at bedtime. 09/11/16  Yes Wende Bushy, MD  vitamin B-12 (CYANOCOBALAMIN) 1000 MCG tablet Take 1,000 mcg by mouth daily.   Yes Historical Provider, MD  vitamin C (ASCORBIC ACID) 500 MG tablet Take 500 mg by mouth at bedtime.   Yes Historical Provider, MD  clarithromycin (BIAXIN) 500 MG tablet Take 500 mg by mouth 2 (two) times daily. 10/05/16 10/15/16  Historical Provider, MD  ticagrelor (BRILINTA) 90 MG TABS tablet Take 1 tablet (90 mg total) by mouth 2 (two) times daily. 09/03/16   Wende Bushy, MD      VITAL SIGNS:  Blood pressure (!) 153/62, pulse 63, temperature 97.5 F (36.4 C), temperature source Oral, resp. rate (!) 22, height 5\' 3"  (1.6 m), weight 87.1 kg (192 lb), SpO2 98 %.  PHYSICAL EXAMINATION:  Physical Exam  GENERAL:  81 y.o.-year-old patient lying in the bed in mild resp. distress.  EYES: Pupils equal, round, reactive to light and accommodation. No scleral icterus. Extraocular muscles intact.  HEENT: Head atraumatic, normocephalic. Oropharynx and nasopharynx clear. No oropharyngeal erythema, moist oral mucosa  NECK:  Supple, no jugular venous distention. No thyroid enlargement, no tenderness.  LUNGS: Normal breath sounds bilaterally, no wheezing, bibasilar rales, No rhonchi. No use of accessory muscles of respiration.  CARDIOVASCULAR: S1, S2 RRR. No murmurs, rubs, gallops, clicks.  ABDOMEN: Soft, nontender, nondistended. Bowel sounds present. No  organomegaly or mass.  EXTREMITIES: No pedal edema, cyanosis, or clubbing. + 2 pedal & radial pulses b/l.   NEUROLOGIC: Cranial nerves II through XII are intact. No focal Motor or sensory deficits appreciated b/l. PSYCHIATRIC: The patient is alert and oriented x 3. Good affect.  SKIN: No obvious rash, lesion, or ulcer.   LABORATORY PANEL:   CBC  Recent Labs Lab 10/08/16 1518  WBC 3.6  HGB 12.2  HCT 35.0  PLT 202   ------------------------------------------------------------------------------------------------------------------  Chemistries   Recent Labs Lab 10/08/16 1518  NA 125*  K 4.2  CL 94*  CO2 22  GLUCOSE 142*  BUN 14  CREATININE 0.95  CALCIUM 9.1  AST 33  ALT 22  ALKPHOS 68  BILITOT 0.7   ------------------------------------------------------------------------------------------------------------------  Cardiac Enzymes  Recent Labs Lab 10/08/16 1518  TROPONINI <0.03   ------------------------------------------------------------------------------------------------------------------  RADIOLOGY:  Dg Chest 2 View  Result Date: 10/08/2016 CLINICAL DATA:  Patient with sore throat and cough for 7 days. Worsening shortness of breath. EXAM: CHEST  2 VIEW COMPARISON:  Chest radiograph 05/31/2016. FINDINGS: Stable enlarged cardiac and mediastinal contours with tortuosity and calcification of the thoracic aorta status post median sternotomy. No large area of pulmonary consolidation. No pleural effusion or pneumothorax. Thoracic spine degenerative changes. Cholecystectomy clips. Lumbar spinal fusion hardware. Aortic atherosclerosis. IMPRESSION: No acute cardiopulmonary process. Aortic atherosclerosis. Electronically Signed   By: Lovey Newcomer M.D.   On: 10/08/2016 16:18     IMPRESSION AND PLAN:   81 year old female with past medical history of coronary artery disease status post bypass, recent cardiac stent  placement, polymyalgia rheumatica, history of paroxysmal SVT,  history of Raynaud's, spinal stenosis, GERD, coronary artery disease, depression, history of migraines, hyperlipidemia who presents to the hospital due to shortness of breath and cough and noted to be new onset atrial fibrillation.  1. CHF-acute CHF, unclear this is systolic or diastolic heart previous echo showed normal ejection fraction in September. -We'll diurese with IV Lasix, follow I's and O's and daily weights.  -continue bisoprolol.  I will get a cardiology consult, get a two-dimensional echocardiogram. Next  2. New onset atrial fibrillation-currently rate controlled. Continue bisoprolol, verapamil. -This is likely what led to patient's decompensated CHF. I will get a two-dimensional echocardiogram, get a cardiology consult. -Patient will likely need to be started on long-term anticoagulation and I will have cardiology discuss with patient.    3. History of coronary disease status post bypass-patient had a repeat recent drug-eluting stent to one of her grafts, it was the SVG to the RCA. -Continue Brilinta, beta blocker  4. Essential hypertension-continue ramipril, Cardizem, bisoprolol.  5. History of paroxysmal SVT/multiple PVCs-continue beta blocker, Cardizem  6. GERD-continue Protonix.    All the records are reviewed and case discussed with ED provider. Management plans discussed with the patient, family and they are in agreement.  CODE STATUS: Full code  TOTAL TIME TAKING CARE OF THIS PATIENT: 45 minutes.    [name] M.D on 10/08/2016 at 5:56 PM  Between 7am to 6pm - Pager - [phone]  After 6pm go to [email or web address] - password [email or web address] [facility]  Office  [phone]  CC: Primary care physician; [name], MD

## 2016-10-09 ENCOUNTER — Encounter: Payer: Self-pay | Admitting: *Deleted

## 2016-10-09 ENCOUNTER — Inpatient Hospital Stay (HOSPITAL_COMMUNITY)
Admit: 2016-10-09 | Discharge: 2016-10-09 | Disposition: A | Discharge status: Home / Self Care | Payer: Medicare Other | Attending: Specialist | Admitting: Specialist

## 2016-10-09 ENCOUNTER — Inpatient Hospital Stay: Payer: Medicare Other

## 2016-10-09 DIAGNOSIS — I2510 Atherosclerotic heart disease of native coronary artery without angina pectoris: Secondary | ICD-10-CM

## 2016-10-09 DIAGNOSIS — R002 Palpitations: Secondary | ICD-10-CM

## 2016-10-09 DIAGNOSIS — I4819 Other persistent atrial fibrillation: Secondary | ICD-10-CM

## 2016-10-09 DIAGNOSIS — I10 Essential (primary) hypertension: Secondary | ICD-10-CM

## 2016-10-09 DIAGNOSIS — Z9861 Coronary angioplasty status: Secondary | ICD-10-CM

## 2016-10-09 DIAGNOSIS — I4891 Unspecified atrial fibrillation: Secondary | ICD-10-CM

## 2016-10-09 DIAGNOSIS — R0600 Dyspnea, unspecified: Secondary | ICD-10-CM

## 2016-10-09 DIAGNOSIS — I5033 Acute on chronic diastolic (congestive) heart failure: Secondary | ICD-10-CM

## 2016-10-09 DIAGNOSIS — E871 Hypo-osmolality and hyponatremia: Secondary | ICD-10-CM

## 2016-10-09 DIAGNOSIS — R0603 Acute respiratory distress: Secondary | ICD-10-CM

## 2016-10-09 LAB — BASIC METABOLIC PANEL
ANION GAP: 11 (ref 5–15)
BUN: 16 mg/dL (ref 6–20)
CO2: 23 mmol/L (ref 22–32)
Calcium: 9.3 mg/dL (ref 8.9–10.3)
Chloride: 97 mmol/L — ABNORMAL LOW (ref 101–111)
Creatinine, Ser: 1.23 mg/dL — ABNORMAL HIGH (ref 0.44–1.00)
GFR, EST AFRICAN AMERICAN: 47 mL/min — AB (ref >60)
GFR, EST NON AFRICAN AMERICAN: 41 mL/min — AB (ref >60)
GLUCOSE: 175 mg/dL — AB (ref 65–99)
POTASSIUM: 3.6 mmol/L (ref 3.5–5.1)
Sodium: 131 mmol/L — ABNORMAL LOW (ref 135–145)

## 2016-10-09 LAB — CBC
HEMATOCRIT: 33.8 % — AB (ref 35.0–47.0)
HEMOGLOBIN: 11.9 g/dL — AB (ref 12.0–16.0)
MCH: 34.9 pg — ABNORMAL HIGH (ref 26.0–34.0)
MCHC: 35.4 g/dL (ref 32.0–36.0)
MCV: 98.8 fL (ref 80.0–100.0)
Platelets: 223 10*3/uL (ref 150–440)
RBC: 3.42 MIL/uL — AB (ref 3.80–5.20)
RDW: 12.5 % (ref 11.5–14.5)
WBC: 5.1 10*3/uL (ref 3.6–11.0)

## 2016-10-09 LAB — MRSA PCR SCREENING: MRSA BY PCR: NEGATIVE

## 2016-10-09 LAB — TROPONIN I

## 2016-10-09 LAB — ECHOCARDIOGRAM COMPLETE
HEIGHTINCHES: 63 in
WEIGHTICAEL: 2897.6 [oz_av]

## 2016-10-09 LAB — MAGNESIUM: MAGNESIUM: 1.9 mg/dL (ref 1.7–2.4)

## 2016-10-09 MED ORDER — OMEGA-3-ACID ETHYL ESTERS 1 G PO CAPS
2.0000 g | ORAL_CAPSULE | Freq: Every day | ORAL | Status: DC
  Administered 2016-10-10: 2 g via ORAL
  Filled 2016-10-09: qty 2

## 2016-10-09 MED ORDER — POTASSIUM CHLORIDE CRYS ER 20 MEQ PO TBCR
20.0000 meq | EXTENDED_RELEASE_TABLET | Freq: Two times a day (BID) | ORAL | Status: DC
  Administered 2016-10-09 (×2): 20 meq via ORAL
  Filled 2016-10-09 (×2): qty 1

## 2016-10-09 MED ORDER — AZITHROMYCIN 500 MG PO TABS
500.0000 mg | ORAL_TABLET | Freq: Every day | ORAL | Status: DC
  Administered 2016-10-09 – 2016-10-10 (×2): 500 mg via ORAL
  Filled 2016-10-09 (×2): qty 1

## 2016-10-09 MED ORDER — IPRATROPIUM BROMIDE 0.02 % IN SOLN
2.5000 mL | Freq: Four times a day (QID) | RESPIRATORY_TRACT | Status: DC
  Administered 2016-10-09 – 2016-10-10 (×3): 0.5 mg via RESPIRATORY_TRACT
  Filled 2016-10-09 (×3): qty 2.5

## 2016-10-09 MED ORDER — FUROSEMIDE 20 MG PO TABS
20.0000 mg | ORAL_TABLET | Freq: Two times a day (BID) | ORAL | Status: DC
  Administered 2016-10-09: 20 mg via ORAL
  Filled 2016-10-09 (×2): qty 1

## 2016-10-09 MED ORDER — CLARITHROMYCIN 500 MG PO TABS
500.0000 mg | ORAL_TABLET | Freq: Two times a day (BID) | ORAL | Status: DC
  Filled 2016-10-09 (×2): qty 1

## 2016-10-09 MED ORDER — VERAPAMIL HCL ER 180 MG PO TBCR
180.0000 mg | EXTENDED_RELEASE_TABLET | Freq: Every day | ORAL | Status: DC
  Administered 2016-10-09: 180 mg via ORAL
  Filled 2016-10-09 (×2): qty 1

## 2016-10-09 MED ORDER — CLOPIDOGREL BISULFATE 75 MG PO TABS
75.0000 mg | ORAL_TABLET | Freq: Every day | ORAL | Status: DC
  Administered 2016-10-09 – 2016-10-10 (×2): 75 mg via ORAL
  Filled 2016-10-09 (×2): qty 1

## 2016-10-09 MED ORDER — APIXABAN 5 MG PO TABS
5.0000 mg | ORAL_TABLET | Freq: Two times a day (BID) | ORAL | Status: DC
  Administered 2016-10-09 – 2016-10-10 (×3): 5 mg via ORAL
  Filled 2016-10-09 (×3): qty 1

## 2016-10-09 NOTE — Consult Note (Signed)
Cardiology Consultation Note  Patient ID: Charlotte Wagner, MRN: [id number], DOB/AGE: [birth year] 81 y.o. Admit date: 10/08/2016   Date of Consult: 10/09/2016 Primary Physician: [name], MD Primary Cardiologist: Dr. Yvone Neu, MD Requesting Physician: Dr. Verdell Carmine, MD  Chief Complaint: SOB Reason for Consult: New onset Afib/acute on chronic diastolic CHF  HPI: 81 y.o. female with h/o CAD s/p CABG in 1998 (LIMA-LAD, SVG-Diag, SVG-LCx, SVG-RCA) s/p PCI/DES to SVG-RCA in 82/17 with remaining grafts patent, frequent PACs/PVCs, chronic diastolic CHF, anxiety, medication intolerance, hypertensive heart disease, HLD, and frequent heart burn who presented to [facility] on 10/08/16 with 2-3 days of increased SOB in the setting of URI/bronchitis and was found to have new onset Afib and have acute on chronic diastolic CHF.  Most recent LHC in 05/2016 in the setting of unstable angina showed 90% stenosis of SVG-RCA otherwise patent grafts. Details below. She has continued to note dyspnea which she has related to [place]. Because of her ongoing SOB she underwent nuclear stress test in 08/2016 that was low risk. She has wanted to come off Brilinta and be placed on Plavix, though this change has been held at this time. She has noted frequent palpitations, undergoing Holter monitor in 08/2016 that showed overall NSR with heart rate ranging from 43-82 bpm, supraventricular ectopy: 61 isolated PACs. One 4 beat run of SVT, likely atrial tachycardia, 110 BPM. Ventricular ectopy was 15% of the total number of beats: 12,062 isolated PVCs. 54 ventricular couplets. 6165 ventricular bigeminal cycles. She has been mostly intolerant to beta blockers, thus her verapamil has been titrated.   Patient recently had an URI/bronchitis. She was seen by outside MD on 12/29 and started on Biaxin. On 12/30 and 12/31 her SOB significantly worsened at rest and with ambulation. Because of this she presented to [facility]. Never with chest pain or worsening in  palpitations. Upon her arrival she was noted to be in new onset Afib, rate controlled. She was SOB and given IV Lasix 40 mg x 2 with good UOP and improved breathing. She was noted to be hyponatremic at 125-->131. Troponin negative this admission.   Past Medical History:  Diagnosis Date  . Allergy   . Arthritis    s/p bilateral knees and left hip replacement  . Asthma   . CAD (coronary artery disease)    a. 12/1996 s/p CABG x4 ([place]);  b. 2005 Pt reports stress test & cath, which revealed patent grafts.  . Cancer ([place])    melanoma right arm  . Carotid arterial disease ([place])    a. 04/2015 Carotid U/S: <50% bilat ICA stenosis.  . Chronic kidney disease   . Colon polyps    H/O  . Depression   . GERD (gastroesophageal reflux disease)    h/o hiatal hernia  . Headache    migraines in past  . Heart murmur    a. 04/2011 Echo: EF 55-60%, bilat atrial enlargement, mild to mod TR.  [name] History of chicken pox   . History of hiatal hernia   . Hx of migraines    rare now  . Hx: UTI (urinary tract infection)   . Hyperlipidemia    a. Statin intolerant -->on zetia.  . Hypertensive heart disease   . Lichen planus   . Melanoma ([place])   . Palpitations    a. rare PVC's and h/o SVT.  [name] PMR (polymyalgia rheumatica) (HCC)    h/o in setting of crestor usage.  [name] PSVT (paroxysmal supraventricular tachycardia) ([place])   .  PUD (peptic ulcer disease)    remote history  . Raynaud's phenomenon   . Spinal stenosis   . Urine incontinence    H/O  . Vitamin D deficiency       Most Recent Cardiac Studies: Left heart cath 06/01/2016:  Prox LAD lesion, 100 %stenosed.  Ost Cx to Prox Cx lesion, 50 %stenosed.  Ost LM to LM lesion, 70 %stenosed.  Mid RCA lesion, 100 %stenosed.  Prox RCA lesion, 60 %stenosed.  SVG graft was visualized by angiography and is normal in caliber.  Post Atrio lesion, 60 %stenosed.  SVG graft was visualized by angiography and is normal in caliber.  The graft exhibits  mild .  The flow in the graft is reversed.  Origin lesion, 30 %stenosed.  SVG graft was visualized by angiography.  Prox Graft lesion, 30 %stenosed.  LIMA graft was visualized by angiography and is normal in caliber and anatomically normal.  The left ventricular systolic function is normal.  LV end diastolic pressure is mildly elevated.  The left ventricular ejection fraction is 55-65% by visual estimate.  A STENT XIENCE ALPINE RX [id number] drug eluting stent was successfully placed, and does not overlap previously placed stent.  Prox Graft lesion, 90 %stenosed.  Post intervention, there is a 0% residual stenosis.  1. Severe underlying three-vessel coronary artery disease with patent grafts including LIMA to LAD, SVG to diagonal, SVG to left circumflex and SVG to RCA. Severe 90% stenosis and SVG to RCA is likely the culprit for unstable angina.  2. Normal LV systolic function and mildly elevated left ventricular end-diastolic pressure.  3. Successful drug-eluting stent placement to the SVG to RCA using a distal protection device.  Recommendations: The patient is severely intolerant to aspirin and she reports inability to take the medication. Thus, I elected to treat her with Brilinta monotherapy. Continue aggressive treatment for coronary artery disease.  Carotid u/s 04/25/2015, ordered by PCP: Less than 50% stenosis bilateral ICA Severe left ECA disease  Echo 06/19/2016: Left ventricle: The cavity size was normal. Systolic function was normal. The estimated ejection fraction was in the range of 60% to 65%. Wall motion was normal; there were no regional wall motion abnormalities. Left ventricular diastolic function parameters were normal. - Mitral valve: There was mild regurgitation. - Left atrium: The atrium was at the upper limits of normal in size. - Right ventricle: Systolic function was normal. - Pulmonary arteries: Systolic pressure was mildly elevated.  PA peak pressure: 40 mm Hg (S).  Nuclear stress is 08/29/2016:  Pharmacological myocardial perfusion imaging study with no significant ischemia Normal wall motion, EF estimated at 54% No EKG changes concerning for ischemia at peak stress or in recovery. Low risk scan  Holter 08/28/2016: Overall rhythm was sinus. Heart rate ranged from 43-82 bpm, average of 53 BPM.  Supraventricular ectopy: 61 isolated PACs. One 4 beat run of SVT, likely atrial tachycardia, 110 BPM.  Ventricular ectopy was 15% of the total number of beats: 12,062 isolated PVCs. 54 ventricular couplets. 6165 ventricular bigeminal cycles.    Surgical History:  Past Surgical History:  Procedure Laterality Date  . ADENOIDECTOMY     age 60  . BACK SURGERY     L3-L5  . BILATERAL CARPAL TUNNEL RELEASE    . BREAST BIOPSY Right    bx x 3 neg  . BREAST SURGERY Right    biopsy x 3 (all benign)  . CARDIAC CATHETERIZATION N/A 06/01/2016   Procedure: LEFT HEART CATH AND CORS/GRAFTS ANGIOGRAPHY;  Surgeon: Wellington Hampshire, MD;  Location: [facility];  Service: Cardiovascular;  Laterality: N/A;  . CARDIAC CATHETERIZATION N/A 06/01/2016   Procedure: Coronary Stent Intervention;  Surgeon: Wellington Hampshire, MD;  Location: [place] [facility];  Service: Cardiovascular;  Laterality: N/A;  . CATARACT EXTRACTION W/PHACO Right 01/04/2016   Procedure: CATARACT EXTRACTION PHACO AND INTRAOCULAR LENS PLACEMENT (IOC);  Surgeon: Leandrew Koyanagi, MD;  Location: [facility];  Service: Ophthalmology;  Laterality: Right;  MALYUGIN  . CATARACT EXTRACTION W/PHACO Left 01/25/2016   Procedure: CATARACT EXTRACTION PHACO AND INTRAOCULAR LENS PLACEMENT ([place]) left eye;  Surgeon: Leandrew Koyanagi, MD;  Location: [facility];  Service: Ophthalmology;  Laterality: Left;  MALYUGIN SHUGARCAINE  . CHOLECYSTECTOMY  90's  . COLONOSCOPY WITH PROPOFOL N/A 05/03/2015   Procedure: COLONOSCOPY WITH PROPOFOL;  Surgeon: Lollie Sails, MD;  Location: [facility];  Service: Endoscopy;  Laterality: N/A;  . CORONARY ARTERY BYPASS GRAFT  98   4 vessel  . ESOPHAGOGASTRODUODENOSCOPY N/A 03/22/2015   Procedure: ESOPHAGOGASTRODUODENOSCOPY (EGD);  Surgeon: Lollie Sails, MD;  [facility] ENDOSCOPY;  Service: Endoscopy;  Laterality: N/A;  . JOINT REPLACEMENT     BILATERAL KNEE REPLACEMENTS  . KNEE ARTHROSCOPY W/ OATS PROCEDURE     Lt knee (9/01), Rt knee (3/11), Lt hip (5/10)  . TOTAL HIP ARTHROPLASTY       Home Meds: Prior to Admission medications   Medication Sig Start Date End Date Taking? Authorizing Provider  acetaminophen (TYLENOL) 500 MG tablet Take 1,000 mg by mouth 2 (two) times daily.    Yes Historical Provider, MD  amitriptyline (ELAVIL) 25 MG tablet Take 1-2 tablets (25-50 mg total) by mouth at bedtime. Patient taking differently: Take 25 mg by mouth at bedtime.  09/06/16  Yes [name], MD  b complex vitamins tablet Take 1 tablet by mouth daily.   Yes Historical Provider, MD  bisoprolol (ZEBETA) 5 MG tablet Take 1 tablet (5 mg total) by mouth at bedtime. Patient taking differently: Take 2.5 mg by mouth at bedtime.  09/04/16  Yes Wende Bushy, MD  BRILINTA 90 MG TABS tablet take 1 tablet by mouth twice a day 09/04/16  Yes Wellington Hampshire, MD  Cholecalciferol (VITAMIN D3) 2000 UNITS TABS Take 1 capsule by mouth daily.    Yes Historical Provider, MD  Coenzyme Q10 (COQ-10) 100 MG CAPS Take 1 tablet by mouth daily.   Yes Historical Provider, MD  ezetimibe (ZETIA) 10 MG tablet take 1 tablet by mouth once daily 09/03/16  Yes [name], MD  Fluticasone-Salmeterol (ADVAIR) 250-50 MCG/DOSE AEPB Inhale 1 puff into the lungs 2 (two) times daily as needed.   Yes Historical Provider, MD  folic acid (FOLVITE) 1 MG tablet take 1 tablet by mouth once daily 06/12/16  Yes [name], MD  hydrocortisone 2.5 % cream apply to VULVA twice a day AT 10AM AND 4 PM 02/06/16  Yes [name], MD  ipratropium  (ATROVENT HFA) 17 MCG/ACT inhaler Inhale 1 puff into the lungs every 4 (four) hours as needed for wheezing.   Yes Historical Provider, MD  ketoconazole (NIZORAL) 2 % cream Apply 1 application topically daily. Patient taking differently: Apply 1 application topically daily as needed for irritation.  06/15/14  Yes [name], MD  loratadine (CLARITIN) 10 MG tablet Take 10 mg by mouth daily.   Yes Historical Provider, MD  meclizine (ANTIVERT) 25 MG tablet Take 25 mg by mouth 3 (three) times daily as needed for dizziness.  Yes Historical Provider, MD  miconazole (MICOTIN) 2 % cream Apply 1 application topically 2 (two) times daily as needed (irritation).   Yes Historical Provider, MD  Multiple Vitamins-Minerals (HAIR/SKIN/NAILS PO) Take 1 tablet by mouth daily.   Yes Historical Provider, MD  nitroGLYCERIN (NITROSTAT) 0.4 MG SL tablet Place 1 tablet (0.4 mg total) under the tongue every 5 (five) minutes as needed for chest pain. Place 1 tablet (0.4 mg total) under the tongue every 5 (five) minutes , 3 times as needed for chest pain. 06/07/16  Yes Nicholes Mango, MD  Omega-3 Fatty Acids (FISH OIL PO) Take 2,000 mg by mouth daily.   Yes Historical Provider, MD  pantoprazole (PROTONIX) 40 MG tablet take 1 tablet by mouth twice a day 08/13/16  Yes [name], MD  Polyethylene Glycol 3350 (MIRALAX PO) Take 8.5 g by mouth at bedtime.    Yes Historical Provider, MD  ramipril (ALTACE) 10 MG capsule take 1 capsule by mouth once daily 07/16/16  Yes [name], MD  spironolactone (ALDACTONE) 25 MG tablet take 1 tablet by mouth every morning 08/31/16  Yes [name], MD  sucralfate (CARAFATE) 1 g tablet Take 1 g by mouth 2 (two) times daily as needed (acid reflux).   Yes Historical Provider, MD  traMADol (ULTRAM) 50 MG tablet Take 1 tablet (50 mg total) by mouth daily as needed. Patient taking differently: Take 50 mg by mouth daily as needed for moderate pain.  08/16/15  Yes [name], MD  traZODone  (DESYREL) 150 MG tablet take 1 and 2/3 tablets to 2 tablets by mouth at bedtime as directed 08/10/16  Yes [name], MD  verapamil (CALAN-SR) 240 MG CR tablet Take 1 tablet (240 mg total) by mouth 2 (two) times daily. Patient taking differently: Take 180-240 mg by mouth 2 (two) times daily. Take 180 mg in the morning and 240 mg at bedtime. 09/11/16  Yes Wende Bushy, MD  vitamin B-12 (CYANOCOBALAMIN) 1000 MCG tablet Take 1,000 mcg by mouth daily.   Yes Historical Provider, MD  vitamin C (ASCORBIC ACID) 500 MG tablet Take 500 mg by mouth at bedtime.   Yes Historical Provider, MD  clarithromycin (BIAXIN) 500 MG tablet Take 500 mg by mouth 2 (two) times daily. 10/05/16 10/15/16  Historical Provider, MD  ticagrelor (BRILINTA) 90 MG TABS tablet Take 1 tablet (90 mg total) by mouth 2 (two) times daily. 09/03/16   Wende Bushy, MD    Inpatient Medications:  . amitriptyline  25 mg Oral QHS  . bisoprolol  2.5 mg Oral QHS  . enoxaparin (LOVENOX) injection  40 mg Subcutaneous Q24H  . ezetimibe  10 mg Oral Daily  . folic acid  1 mg Oral Daily  . furosemide  40 mg Intravenous Q12H  . loratadine  10 mg Oral Daily  . mometasone-formoterol  2 puff Inhalation BID  . multivitamin  1 tablet Oral Daily  . omega-3 acid ethyl esters  1 g Oral Daily  . pantoprazole  40 mg Oral BID  . ramipril  10 mg Oral Daily  . sodium chloride flush  3 mL Intravenous Q12H  . ticagrelor  90 mg Oral BID  . verapamil  180 mg Oral q morning - 10a  . verapamil  240 mg Oral QHS  . vitamin B-12  1,000 mcg Oral Daily  . vitamin C  500 mg Oral QHS     Allergies:  Allergies  Allergen Reactions  . Beta Adrenergic Blockers Shortness Of Breath  . Albuterol  rigors  . Aspirin Other (See Comments)    Heartburn   . Penicillins Hives and Other (See Comments)    Has patient had a PCN reaction causing immediate rash, facial/tongue/throat swelling, SOB or lightheadedness with hypotension: No Has patient had a PCN reaction  causing severe rash involving mucus membranes or skin necrosis: No Has patient had a PCN reaction that required hospitalization No Has patient had a PCN reaction occurring within the last 10 years: No If all of the above answers are "NO", then may proceed with Cephalosporin use.   . Statins Other (See Comments)    Muscle weakness  . Sulfa Antibiotics Rash    At mouth  . Tetracycline Rash  . Tetracyclines & Related Rash    Social History   Social History  . Marital status: Married    Spouse name: N/A  . Number of children: 3  . Years of education: N/A   Occupational History  . Retired Sport and exercise psychologist    Social History Main Topics  . Smoking status: Former Research scientist (life sciences)  . Smokeless tobacco: Never Used     Comment: quit 44+ yrs ago  . Alcohol use No  . Drug use: No  . Sexual activity: No   Other Topics Concern  . Not on file   Social History Narrative   Lives in [place].  Retired Engineer, drilling.  Relatively active.     Family History  Problem Relation Age of Onset  . Thyroid disease Mother     graves disease  . Heart disease Father     rheumatic heart  . Alcohol abuse Father   . Depression Father   . Lung cancer Sister   . Depression Daughter   . Thyroid disease Daughter     hashimoto  . Depression Son   . Stroke Maternal Grandmother   . Depression Maternal Grandmother   . Hypertension Maternal Grandfather   . Hypertension Paternal Grandfather   . Breast cancer Neg Hx      Review of Systems: Review of Systems  Constitutional: Positive for malaise/fatigue. Negative for chills, diaphoresis, fever and weight loss.  HENT: Negative for congestion.   Eyes: Negative for discharge and redness.  Respiratory: Positive for cough, sputum production and shortness of breath. Negative for hemoptysis and wheezing.        Yellow sputum  Cardiovascular: Negative for chest pain, palpitations, orthopnea, claudication, leg swelling and PND.  Gastrointestinal: Positive for  heartburn. Negative for abdominal pain, blood in stool, melena, nausea and vomiting.  Genitourinary: Negative for hematuria.  Musculoskeletal: Negative for falls and myalgias.  Skin: Negative for rash.  Neurological: Positive for weakness. Negative for dizziness, tingling, tremors, sensory change, speech change, focal weakness and loss of consciousness.  Endo/Heme/Allergies: Does not bruise/bleed easily.  Psychiatric/Behavioral: Negative for substance abuse. The patient is nervous/anxious.   All other systems reviewed and are negative.   Labs:  Recent Labs  10/08/16 1518 10/08/16 1903 10/08/16 2223 10/09/16 0251  TROPONINI <0.03 <0.03 <0.03 <0.03   Lab Results  Component Value Date   WBC 5.1 10/09/2016   HGB 11.9 (L) 10/09/2016   HCT 33.8 (L) 10/09/2016   MCV 98.8 10/09/2016   PLT 223 10/09/2016    Recent Labs Lab 10/08/16 1518  10/09/16 0251  NA 125*  --  131*  K 4.2  --  3.6  CL 94*  --  97*  CO2 22  --  23  BUN 14  --  16  CREATININE 0.95  < >  1.23*  CALCIUM 9.1  --  9.3  PROT 6.9  --   --   BILITOT 0.7  --   --   ALKPHOS 68  --   --   ALT 22  --   --   AST 33  --   --   GLUCOSE 142*  --  175*  < > = values in this interval not displayed. Lab Results  Component Value Date   CHOL 221 (H) 03/26/2016   HDL 54.80 03/26/2016   LDLCALC 143 (H) 03/26/2016   TRIG 119.0 03/26/2016   No results found for: DDIMER  Radiology/Studies:  Dg Chest 2 View  Result Date: 10/08/2016 CLINICAL DATA:  Patient with sore throat and cough for 7 days. Worsening shortness of breath. EXAM: CHEST  2 VIEW COMPARISON:  Chest radiograph 05/31/2016. FINDINGS: Stable enlarged cardiac and mediastinal contours with tortuosity and calcification of the thoracic aorta status post median sternotomy. No large area of pulmonary consolidation. No pleural effusion or pneumothorax. Thoracic spine degenerative changes. Cholecystectomy clips. Lumbar spinal fusion hardware. Aortic atherosclerosis.  IMPRESSION: No acute cardiopulmonary process. Aortic atherosclerosis. Electronically Signed   By: Lovey Newcomer M.D.   On: 10/08/2016 16:18    EKG: Interpreted by me showed: Afib, 80 bpm, no acute st/t changes Telemetry: Interpreted by me showed: Afib, 40's bpm overnight, 60's bpm currently  Weights: Filed Weights   10/08/16 1515 10/08/16 1931  Weight: 192 lb (87.1 kg) 181 lb 1.6 oz (82.1 kg)     Physical Exam: Blood pressure 129/61, pulse 63, temperature 97.8 F (36.6 C), resp. rate 18, height 5\' 3"  (1.6 m), weight 181 lb 1.6 oz (82.1 kg), SpO2 95 %. Body mass index is 32.08 kg/m. General: Well developed, well nourished, in no acute distress. Head: Normocephalic, atraumatic, sclera non-icteric, no xanthomas, nares are without discharge.  Neck: Negative for carotid bruits. JVD not elevated. Lungs: Clear bilaterally to auscultation without wheezes, rales, or rhonchi. Breathing is unlabored. Heart: Irregularly irregular, with S1 S2. No murmurs, rubs, or gallops appreciated. Abdomen: Soft, non-tender, non-distended with normoactive bowel sounds. No hepatomegaly. No rebound/guarding. No obvious abdominal masses. Msk:  Strength and tone appear normal for age. Extremities: No clubbing or cyanosis. No edema. Distal pedal pulses are 2+ and equal bilaterally. Neuro: Alert and oriented X 3. No facial asymmetry. No focal deficit. Moves all extremities spontaneously. Psych:  Responds to questions appropriately with a normal affect.    Assessment and Plan:  Principal Problem:   Acute respiratory distress Active Problems:   CAD (coronary artery disease)   SOB (shortness of breath)   Anxiety   New onset atrial fibrillation (HCC)   Essential hypertension, benign   Palpitations   Hyponatremia   Acute on chronic diastolic CHF (congestive heart failure) ([place])    1. Acute respiratory distress: -Likely in the setting of acute on chronic diastolic CHF and new onset Afib -Not currently requiring  oxygen  2. New onset Afib: -Remains in Afib with heart rate in the 60's bpm at this time -Heart rate in the 40's bpm overnight -Likely in the setting of her recent URI/bronchitis and increased stress at home -Given her CHADS2VASc of at least 6 (CHF, HTN, age x 2, vascular disease, sex category) she will need long term full-dose anticoagulation -Uncertain how long she has been in this rhythm as she cannot feel palpitations at this time, though her SOB did significantly worsen on 12/31 -Will discuss DOAC with MD given need to antiplatelet medication as well per  below -Continue bisoprolol and verapamil with decrease in PM verapamil dose dose given bradycardia -Continue uninterrupted full-dose anticoagulation for at least 3 weeks with planned outpatient DCCV as long as her heart rate remains well controlled -Replete potassium to 4.0 -Recent TSH 09/2015 normal -Check magnesium  -Echo pending  3. Acute on chronic diastolic CHF: -Breathing better s/p IV Lasix 40 mg x 2 with a UOP of 2 L for the admission to date -May need prn Lasix at discharge for SOB -Likely in the setting of her recent URI -Continue current medications  4. CAD s/p CABG in 1998 and recent PCI to graft in 05/2016 as above: -No symptoms concerning for angina at this time -Has ruled out, EKG non-acute -Has been on Brilinta alone given severe heart burn with ASA -With need for full-dose anticoagulation as above 2/2 her recently diagnosed Afib she will likely need to change to Plavix along with anticoagulation with DOAC, will discuss with MD -Continue Zetia  5. Hypertensive heart disease: -BP soft at times -Verapamil dose decreased as above -Stop ramipril   6. HLD: -Intolerant to statins -Zetia  7. Hyponatremia: -Improving    Signed, Marcille Blanco [place] Pager: [phone] 10/09/2016, 7:54 AM

## 2016-10-09 NOTE — Care Management Important Message (Signed)
Important Message  Patient Details  Name: LINZEY LYKINS MRN: [id number] Date of Birth: Feb 04, 1936   Medicare Important Message Given:  Yes Initial signed IM printed from Epic and given to patient.    Katrina Stack, RN 10/09/2016, 8:42 AM

## 2016-10-09 NOTE — Progress Notes (Signed)
Heart Failure Clinic appointment on October 19, 2016 at 11:30am with Darylene Price, [place]. Please call [phone] to reschedule.

## 2016-10-09 NOTE — Care Management (Signed)
Charlotte Wagner was in a good mood considering the fact that she is in our care. She does not feel 100% yet but she is encouraged. We spoke sparingly and she mentioned that her [name] would be headed to see her soon. I shared with her the services we offered and let her know that if she needs anything that we are here to serve her.

## 2016-10-09 NOTE — Progress Notes (Signed)
Cardiac Individual Treatment Plan  Patient Details  Name: Charlotte Wagner MRN: [id number] Date of Birth: 03/01/1936 Referring Provider:   Flowsheet Row Cardiac Rehab from 06/21/2016 in [facility] Cardiac and Pulmonary Rehab  Referring Provider  Kathlyn Sacramento MD      Initial Encounter Date:  Flowsheet Row Cardiac Rehab from 06/21/2016 in [facility] Cardiac and Pulmonary Rehab  Date  06/21/16  Referring Provider  Kathlyn Sacramento MD      Visit Diagnosis: S/P PTCA (percutaneous transluminal coronary angioplasty)  Patient's Home Medications on Admission: No current facility-administered medications for this visit.  No current outpatient prescriptions on file.  Facility-Administered Medications Ordered in Other Visits:  .  acetaminophen (TYLENOL) tablet 650 mg, 650 mg, Oral, Q6H PRN **OR** acetaminophen (TYLENOL) suppository 650 mg, 650 mg, Rectal, Q6H PRN, [name], MD .  amitriptyline (ELAVIL) tablet 25 mg, 25 mg, Oral, QHS, [name], MD, 25 mg at 10/08/16 2218 .  bisoprolol (ZEBETA) tablet 2.5 mg, 2.5 mg, Oral, QHS, [name], MD, 2.5 mg at 10/08/16 2219 .  enoxaparin (LOVENOX) injection 40 mg, 40 mg, Subcutaneous, Q24H, [name], MD, 40 mg at 10/08/16 2219 .  ezetimibe (ZETIA) tablet 10 mg, 10 mg, Oral, Daily, [name], MD, 10 mg at 10/08/16 2222 .  folic acid (FOLVITE) tablet 1 mg, 1 mg, Oral, Daily, [name], MD .  furosemide (LASIX) injection 40 mg, 40 mg, Intravenous, Q12H, [name], MD .  hydrocortisone 2.5 % cream, , Topical, BID PRN, [name], MD .  ipratropium (ATROVENT) nebulizer solution 0.5 mg, 2.5 mL, Inhalation, Q4H PRN, Lance Coon, MD, 0.5 mg at 10/08/16 2220 .  loratadine (CLARITIN) tablet 10 mg, 10 mg, Oral, Daily, [name], MD .  meclizine (ANTIVERT) tablet 25 mg, 25 mg, Oral, TID PRN, [name], MD .  mometasone-formoterol [facility]) 200-5 MCG/ACT inhaler 2 puff, 2 puff, Inhalation, BID, Lance Coon, MD, 2 puff  at 10/08/16 2222 .  multivitamin (RENA-VIT) tablet 1 tablet, 1 tablet, Oral, Daily, [name], MD .  nitroGLYCERIN (NITROSTAT) SL tablet 0.4 mg, 0.4 mg, Sublingual, Q5 min PRN, [name], MD .  omega-3 acid ethyl esters (LOVAZA) capsule 1 g, 1 g, Oral, Daily, [name], MD .  ondansetron (ZOFRAN) tablet 4 mg, 4 mg, Oral, Q6H PRN **OR** ondansetron (ZOFRAN) injection 4 mg, 4 mg, Intravenous, Q6H PRN, [name], MD .  pantoprazole (PROTONIX) EC tablet 40 mg, 40 mg, Oral, BID, [name], MD, 40 mg at 10/08/16 2218 .  ramipril (ALTACE) capsule 10 mg, 10 mg, Oral, Daily, [name], MD, 10 mg at 10/08/16 2218 .  sodium chloride flush (NS) 0.9 % injection 3 mL, 3 mL, Intravenous, Q12H, [name], MD, 3 mL at 10/08/16 2225 .  sucralfate (CARAFATE) tablet 1 g, 1 g, Oral, BID PRN, [name], MD .  ticagrelor (BRILINTA) tablet 90 mg, 90 mg, Oral, BID, [name], MD, 90 mg at 10/08/16 2218 .  traMADol (ULTRAM) tablet 50 mg, 50 mg, Oral, Daily PRN, [name], MD .  traZODone (DESYREL) tablet 225 mg, 225 mg, Oral, QHS PRN, Lance Coon, MD, 225 mg at 10/08/16 2219 .  verapamil (CALAN-SR) CR tablet 180 mg, 180 mg, Oral, q morning - 10a, [name], MD .  verapamil (CALAN-SR) CR tablet 240 mg, 240 mg, Oral, QHS, [name], MD, 240 mg at 10/08/16 2218 .  vitamin B-12 (CYANOCOBALAMIN)  tablet 1,000 mcg, 1,000 mcg, Oral, Daily, [name], MD .  vitamin C (ASCORBIC ACID) tablet 500 mg, 500 mg, Oral, QHS, [name], MD  Past Medical History: Past Medical History:  Diagnosis Date  . Allergy   . Arthritis    s/p bilateral knees and left hip replacement  . Asthma   . CAD (coronary artery disease)    a. 12/1996 s/p CABG x4 ([place]);  b. 2005 Pt reports stress test & cath, which revealed patent grafts.  . Cancer ([place])    melanoma right arm  . Carotid arterial disease ([place])    a. 04/2015 Carotid U/S: <50% bilat ICA stenosis.  .  Chronic kidney disease   . Colon polyps    H/O  . Depression   . GERD (gastroesophageal reflux disease)    h/o hiatal hernia  . Headache    migraines in past  . Heart murmur    a. 04/2011 Echo: EF 55-60%, bilat atrial enlargement, mild to mod TR.  [name] History of chicken pox   . History of hiatal hernia   . Hx of migraines    rare now  . Hx: UTI (urinary tract infection)   . Hyperlipidemia    a. Statin intolerant -->on zetia.  . Hypertensive heart disease   . Lichen planus   . Melanoma ([place])   . Palpitations    a. rare PVC's and h/o SVT.  [name] PMR (polymyalgia rheumatica) (HCC)    h/o in setting of crestor usage.  [name] PSVT (paroxysmal supraventricular tachycardia) ([place])   . PUD (peptic ulcer disease)    remote history  . Raynaud's phenomenon   . Spinal stenosis   . Urine incontinence    H/O  . Vitamin D deficiency     Tobacco Use: History  Smoking Status  . Former Smoker  Smokeless Tobacco  . Never Used    Comment: quit 44+ yrs ago    Labs: Recent Review Flowsheet Data    Labs for ITP Cardiac and Pulmonary Rehab Latest Ref Rng & Units 06/15/2014 12/02/2014 03/21/2015 09/12/2015 03/26/2016   Cholestrol 0 - 200 mg/dL 244(H) 193 208(H) 204(H) 221(H)   LDLCALC 0 - 99 mg/dL 170(H) 120(H) 140(H) 137(H) 143(H)   LDLDIRECT mg/dL - - - - -   HDL >39.00 mg/dL 46.80 50.00 49.90 49.70 54.80   Trlycerides 0.0 - 149.0 mg/dL 137.0 114.0 92.0 86.0 119.0       Exercise Target Goals:    Exercise Program Goal: Individual exercise prescription set with THRR, safety & activity barriers. Participant demonstrates ability to understand and report RPE using BORG scale, to self-measure pulse accurately, and to acknowledge the importance of the exercise prescription.  Exercise Prescription Goal: Starting with aerobic activity 30 plus minutes a day, 3 days per week for initial exercise prescription. Provide home exercise prescription and guidelines that participant acknowledges understanding prior  to discharge.  Activity Barriers & Risk Stratification:     Activity Barriers & Cardiac Risk Stratification - 06/21/16 1434      Activity Barriers & Cardiac Risk Stratification   Activity Barriers Arthritis;Joint Problems;Shortness of Breath;Back Problems;Balance Concerns;Assistive Device;Left Hip Replacement;Left Knee Replacement;Right Knee Replacement;Neck/Spine Problems;Deconditioning;Muscular Weakness;History of Falls   Cardiac Risk Stratification High      6 Minute Walk:     6 Minute Walk    Row Name 06/21/16 1435         6 Minute Walk   Phase Initial     Distance 1060 feet  Walk Time 6 minutes     # of Rest Breaks 0     MPH 2.01     METS 1.75     RPE 13     Perceived Dyspnea  2     VO2 Peak 6.12     Symptoms Yes (comment)     Comments back pain     Resting HR 75 bpm     Resting BP 132/82     Max Ex. HR 98 bpm     Max Ex. BP 136/64     2 Minute Post BP 130/66        Initial Exercise Prescription:     Initial Exercise Prescription - 06/21/16 1400      Date of Initial Exercise RX and Referring Provider   Date 06/21/16   Referring Provider Kathlyn Sacramento MD     Treadmill   MPH 1   Grade 0   Minutes 15   METs 1.77     NuStep   Level 2   Minutes 15   METs 1.8     Biostep-RELP   Level 2   Minutes 15   METs 2     Prescription Details   Frequency (times per week) 3   Duration Progress to 45 minutes of aerobic exercise without signs/symptoms of physical distress     Intensity   THRR 40-80% of Max Heartrate 102-129   Ratings of Perceived Exertion 11-15   Perceived Dyspnea 0-4     Progression   Progression Continue to progress workloads to maintain intensity without signs/symptoms of physical distress.     Resistance Training   Training Prescription Yes   Weight 2 lbs   Reps 10-12      Perform Capillary Blood Glucose checks as needed.  Exercise Prescription Changes:     Exercise Prescription Changes    Row Name 06/21/16 1400  07/11/16 1400 07/12/16 1700 07/26/16 1200 08/08/16 1300     Exercise Review   Progression -  walk test results Yes Yes Yes Yes     Response to Exercise   Blood Pressure (Admit) 132/82 148/74 148/74 132/64 130/80   Blood Pressure (Exercise) 136/64 142/82 142/82 148/70  -   Blood Pressure (Exit) 130/66 116/60 116/60 124/66 124/70   Heart Rate (Admit) 75 bpm 50 bpm 50 bpm 68 bpm 80 bpm   Heart Rate (Exercise) 98 bpm 116 bpm 116 bpm 110 bpm 105 bpm   Heart Rate (Exit)  - 71 bpm 71 bpm 68 bpm 66 bpm   Oxygen Saturation (Admit) 97 %  -  -  -  -   Rating of Perceived Exertion (Exercise) '13 14 14 14 13   '$ Perceived Dyspnea (Exercise) 2  -  -  -  -   Symptoms back pain  -  -  -  -   Comments  -  - Home Exercise Guidelines given 07/12/16  -  -   Duration  - Progress to 45 minutes of aerobic exercise without signs/symptoms of physical distress Progress to 45 minutes of aerobic exercise without signs/symptoms of physical distress Progress to 45 minutes of aerobic exercise without signs/symptoms of physical distress Progress to 45 minutes of aerobic exercise without signs/symptoms of physical distress   Intensity  - THRR unchanged THRR unchanged THRR unchanged THRR unchanged     Progression   Progression  - Continue to progress workloads to maintain intensity without signs/symptoms of physical distress. Continue to progress workloads to maintain intensity without signs/symptoms  of physical distress. Continue to progress workloads to maintain intensity without signs/symptoms of physical distress. Continue to progress workloads to maintain intensity without signs/symptoms of physical distress.   Average METs  - 2.14 2.14 2.9 2.53     Resistance Training   Training Prescription  - Yes Yes Yes Yes   Weight  - '3 3 3 3   '$ Reps  - 10-12 10-12 10-12 10-12     Interval Training   Interval Training  - No No No No     Treadmill   MPH  - 1 1 1.9 2   Grade  - 0 0 0 0   Minutes  - '15 15 15 15   '$ METs  - 1.77  1.77  - 2.53     NuStep   Level  - '6 6 6  '$ -   Minutes  - '15 15 15  '$ -   METs  - 2.5 2.5 2.9  -     Biostep-RELP   Level  -  -  -  - 3   Minutes  -  -  -  - 15     Home Exercise Plan   Plans to continue exercise at  -  - [employer] (comment)  walking and [employer]  -  -   Frequency  -  - Add 3 additional days to program exercise sessions.  -  -   Row Name 08/23/16 1200 09/06/16 1100 09/20/16 1100 10/05/16 1000       Exercise Review   Progression Yes Yes  - Yes      Response to Exercise   Blood Pressure (Admit) 138/78 138/78 126/64 132/64    Blood Pressure (Exercise) 144/70 144/70 144/72 128/64    Blood Pressure (Exit) 132/72 132/72 120/58 110/60    Heart Rate (Admit) 78 bpm 78 bpm 78 bpm 95 bpm    Heart Rate (Exercise) 90 bpm 90 bpm 81 bpm 103 bpm    Heart Rate (Exit) 65 bpm 65 bpm 64 bpm 94 bpm    Rating of Perceived Exertion (Exercise) '13 13 12 12    '$ Symptoms  -  -  - none    Comments  -  -  - Home Exercise Guidelines given 07/12/16    Duration Progress to 45 minutes of aerobic exercise without signs/symptoms of physical distress Progress to 45 minutes of aerobic exercise without signs/symptoms of physical distress Progress to 45 minutes of aerobic exercise without signs/symptoms of physical distress Progress to 45 minutes of aerobic exercise without signs/symptoms of physical distress    Intensity THRR unchanged THRR unchanged THRR unchanged THRR unchanged      Progression   Progression Continue to progress workloads to maintain intensity without signs/symptoms of physical distress. Continue to progress workloads to maintain intensity without signs/symptoms of physical distress. Continue to progress workloads to maintain intensity without signs/symptoms of physical distress. Continue to progress workloads to maintain intensity without signs/symptoms of physical distress.    Average METs 3.6 3.6 2.55 2.54      Resistance Training   Training Prescription Yes Yes Yes Yes     Weight '3 3 3 3    '$ Reps 10-12 10-12 10-12 10-12      Interval Training   Interval Training No No No No      Treadmill   MPH  -  -  - 2    Grade  -  -  - 0    Minutes  -  -  -  15    METs  -  -  - 2.53      NuStep   Level '6 6 6 6    '$ Minutes '15 15 15 15    '$ METs 3.2 3.2 3.1 3.1      Biostep-RELP   Level  -  - 3 4    Minutes  -  - 15 15    METs  -  - 2 3      Home Exercise Plan   Plans to continue exercise at  -  -  - [employer] (comment)  walking and [employer]  -  -  - Add 3 additional days to program exercise sessions.       Exercise Comments:     Exercise Comments    Row Name 06/28/16 1812 07/11/16 1421 07/12/16 1723 07/26/16 1246 08/08/16 1314   Exercise Comments First full day of exercise!  Patient was oriented to gym and equipment including functions, settings, policies, and procedures.  Patient's individual exercise prescription and treatment plan were reviewed.  All starting workloads were established based on the results of the 6 minute walk test done at initial orientation visit.  The plan for exercise progression was also introduced and progression will be customized based on patient's performance and goals. Mardel is progressing well with exercise. Reviewed home exercise with pt today.  Pt plans to walk at home and go [facility] class[facility]gym at [facility] for exercise.  Reviewed THR, pulse, RPE, sign and symptoms, NTG use, and when to call 911 or MD.  Also discussed weather considerations and indoor options.  Pt voiced understanding. Katira continues to progress well with exercise. Felice is porgressing well with exercise.   [place] Name 08/23/16 1300 09/06/16 1118 09/13/16 1722 09/20/16 1114 10/05/16 1032   Exercise Comments Aryn is doing well with exercise during HT and also using the facility at [facility] on days she doen't attend HT. Briunna has not attended since 08/20/16. First day back - starting back easy with no treadmill. Loma is just using the NS and  Biostep since she has returned to [facility] after missing sessions. Leslea has added the treadmill back into her rotation.  She has been out with a cold for the past week.  We will monitor her workloads and progress upon return.      Discharge Exercise Prescription (Final Exercise Prescription Changes):     Exercise Prescription Changes - 10/05/16 1000      Exercise Review   Progression Yes     Response to Exercise   Blood Pressure (Admit) 132/64   Blood Pressure (Exercise) 128/64   Blood Pressure (Exit) 110/60   Heart Rate (Admit) 95 bpm   Heart Rate (Exercise) 103 bpm   Heart Rate (Exit) 94 bpm   Rating of Perceived Exertion (Exercise) 12   Symptoms none   Comments Home Exercise Guidelines given 07/12/16   Duration Progress to 45 minutes of aerobic exercise without signs/symptoms of physical distress   Intensity THRR unchanged     Progression   Progression Continue to progress workloads to maintain intensity without signs/symptoms of physical distress.   Average METs 2.54     Resistance Training   Training Prescription Yes   Weight 3   Reps 10-12     Interval Training   Interval Training No     Treadmill   MPH 2   Grade 0   Minutes 15   METs 2.53  NuStep   Level 6   Minutes 15   METs 3.1     Biostep-RELP   Level 4   Minutes 15   METs 3     Home Exercise Plan   Plans to continue exercise at [employer] (comment)  walking and [place]   Frequency Add 3 additional days to program exercise sessions.      Nutrition:  Target Goals: Understanding of nutrition guidelines, daily intake of sodium '1500mg'$ , cholesterol '200mg'$ , calories 30% from fat and 7% or less from saturated fats, daily to have 5 or more servings of fruits and vegetables.  Biometrics:     Pre Biometrics - 06/21/16 1443      Pre Biometrics   Height 5' 3.5" (1.613 m)   Weight 184 lb 8 oz (83.7 kg)   Waist Circumference 37.5 inches   Hip Circumference 47 inches   Waist to Hip Ratio  0.8 %   BMI (Calculated) 32.2   Single Leg Stand 1.1 seconds       Nutrition Therapy Plan and Nutrition Goals:     Nutrition Therapy & Goals - 06/21/16 1455      Nutrition Therapy   Diet --  Carolann prefers not to meet individually with the Cardiac Rehab Registered Dietician.      Nutrition Discharge: Rate Your Plate Scores:     Nutrition Assessments - 06/21/16 1456      Rate Your Plate Scores   Pre Score 58   Pre Score % 64 %      Nutrition Goals Re-Evaluation:     Nutrition Goals Re-Evaluation    [place] Name 08/06/16 1406             Personal Goal #1 Re-Evaluation   Personal Goal #1 Cont heart healthy diet.       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 "I know what to eat but always I haven't eaten correctly"          Psychosocial: Target Goals: Acknowledge presence or absence of depression, maximize coping skills, provide positive support system. Participant is able to verbalize types and ability to use techniques and skills needed for reducing stress and depression.  Initial Review & Psychosocial Screening:     Initial Psych Review & Screening - 06/21/16 1441      Initial Review   Current issues with History of Depression     Family Dynamics   Good Support System? Yes   Comments Zeenat reports that she has a family history of depression. Cleta said she started herself on antidepressants after she asked her husband to leave and she was raising 3 children alone plus private MD practice.     Screening Interventions   Interventions Encouraged to exercise      Quality of Life Scores:     Quality of Life - 06/21/16 1442      Quality of Life Scores   Health/Function Pre 23.18 %   Socioeconomic Pre 28.93 %   Psych/Spiritual Pre 29.14 %   Family Pre 30 %   GLOBAL Pre 26.59 %      PHQ-9: Recent Review Flowsheet Data    Depression screen [facility] 2/9 09/06/2016  06/21/2016 06/14/2016 02/23/2016 08/16/2015   Decreased Interest 0 1 0 0 0   Down, Depressed, Hopeless 0 1 0 0 0   PHQ - 2 Score 0 2 0 0 0  Altered sleeping - 2 - - -   Tired, decreased energy - 1 - - -   Change in appetite - 0 - - -   Feeling bad or failure about yourself  - 0 - - -   Trouble concentrating - 0 - - -   Moving slowly or fidgety/restless - 0 - - -   Suicidal thoughts - 0 - - -   PHQ-9 Score - 5 - - -   Difficult doing work/chores - Somewhat difficult - - -      Psychosocial Evaluation and Intervention:     Psychosocial Evaluation - 07/02/16 1721      Psychosocial Evaluation & Interventions   Interventions Encouraged to exercise with the program and follow exercise prescription   Comments Counselor met with Ms. Rhoads today for initial psychosocial evaluation.  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  Ms. Haft lives in a retirement community and has a strong support system with a daughter who lives close by and active involvement in her local church.  Ms. Stlaurent has multiple health issues and reports she is sleeping better now with some medication adjustments.  She admits to a family history of depression and anxiety and she has been taking medications for her own mood issues for quite some time.  She reports her current mood is typically positive.  Ms. Stroope states her primary stressors are her health currently which includes bouts of breathlessness; as well as experiencing a great deal of pain when she stands for long periods of time; which she attributes to her spinal stenosis which keeps her bent over and walking with a cane or walker most of the day.  Counselor will continue to follow with Ms. Baune while in this program.        Psychosocial Re-Evaluation:     Psychosocial Re-Evaluation    [place] Name 08/06/16 1412  08/23/16 1424           Psychosocial Re-Evaluation   Interventions Encouraged to attend Cardiac Rehabilitation for the exercise  -      Comments Grasiela said it was stressful getting a notice from [facility] that they would not pay for what Medicare doesn't pay for. Cesia said she was glad to find out that Medicare does cover Cardiac Rehab.  Vincenta called today and said she is sorry that she will miss Cardiac Rehab but  she will be out a little bit since she reports that MD wants to find out what is going on with her heart.          Vocational Rehabilitation: Provide vocational rehab assistance to qualifying candidates.   Vocational Rehab Evaluation & Intervention:     Vocational Rehab - 06/21/16 1435      Initial Vocational Rehab Evaluation & Intervention   Assessment shows need for Vocational Rehabilitation No      Education: Education Goals: Education classes will be provided on a weekly basis, covering required topics. Participant will state understanding/return demonstration of topics presented.  Learning Barriers/Preferences:     Learning Barriers/Preferences - 06/21/16 1434      Learning Barriers/Preferences   Learning Barriers None;Exercise Concerns   Learning Preferences Audio;Group Instruction;Written Material      Education Topics: General Nutrition Guidelines/Fats and Fiber: -Group instruction provided by verbal, written material, models and posters  to present the general guidelines for heart healthy nutrition. Gives an explanation and review of dietary fats and fiber.   Controlling Sodium/Reading Food Labels: -Group verbal and written material supporting the discussion of sodium use in heart healthy nutrition. Review and explanation with models, verbal and written materials for utilization of the food label.   Exercise Physiology & Risk Factors: - Group verbal and written instruction with models to review the exercise physiology of the  cardiovascular system and associated critical values. Details cardiovascular disease risk factors and the goals associated with each risk factor. Flowsheet Row Cardiac Rehab from 07/23/2016 in [facility] Cardiac and Pulmonary Rehab  Date  07/09/16  Educator  [facility]  Instruction Review Code  2- meets goals/outcomes      Aerobic Exercise & Resistance Training: - Gives group verbal and written discussion on the health impact of inactivity. On the components of aerobic and resistive training programs and the benefits of this training and how to safely progress through these programs. Flowsheet Row Cardiac Rehab from 07/23/2016 in [facility] Cardiac and Pulmonary Rehab  Date  07/16/16  Educator  [facility]  Instruction Review Code  2- meets goals/outcomes      Flexibility, Balance, General Exercise Guidelines: - Provides group verbal and written instruction on the benefits of flexibility and balance training programs. Provides general exercise guidelines with specific guidelines to those with heart or lung disease. Demonstration and skill practice provided.   Stress Management: - Provides group verbal and written instruction about the health risks of elevated stress, cause of high stress, and healthy ways to reduce stress.   Depression: - Provides group verbal and written instruction on the correlation between heart/lung disease and depressed mood, treatment options, and the stigmas associated with seeking treatment.   Anatomy & Physiology of the Heart: - Group verbal and written instruction and models provide basic cardiac anatomy and physiology, with the coronary electrical and arterial systems. Review of: AMI, Angina, Valve disease, Heart Failure, Cardiac Arrhythmia, Pacemakers, and the ICD. Flowsheet Row Cardiac Rehab from 07/23/2016 in [facility] Cardiac and Pulmonary Rehab  Date  07/23/16  Educator  CE  Instruction Review Code  2- meets goals/outcomes      Cardiac Procedures: - Group verbal and written  instruction and models to describe the testing methods done to diagnose heart disease. Reviews the outcomes of the test results. Describes the treatment choices: Medical Management, Angioplasty, or Coronary Bypass Surgery.   Cardiac Medications: - Group verbal and written instruction to review commonly prescribed medications for heart disease. Reviews the medication, class of the drug, and side effects. Includes the steps to properly store meds and maintain the prescription regimen.   Go Sex-Intimacy & Heart Disease, Get SMART - Goal Setting: - Group verbal and written instruction through game format to discuss heart disease and the return to sexual intimacy. Provides group verbal and written material to discuss and apply goal setting through the application of the S.M.A.R.T. Method.   Other Matters of the Heart: - Provides group verbal, written materials and models to describe Heart Failure, Angina, Valve Disease, and Diabetes in the realm of heart disease. Includes description of the disease process and treatment options available to the cardiac patient. Flowsheet Row Cardiac Rehab from 07/23/2016 in [facility] Cardiac and Pulmonary Rehab  Date  07/23/16  Educator  CE  Instruction Review Code  2- meets goals/outcomes      Exercise & Equipment Safety: - Individual verbal instruction and demonstration of equipment use and safety with use of  the equipment. Flowsheet Row Cardiac Rehab from 07/23/2016 in [facility] Cardiac and Pulmonary Rehab  Date  06/21/16  Educator  C. EnterkinRN  Instruction Review Code  1- partially meets, needs review/practice      Infection Prevention: - Provides verbal and written material to individual with discussion of infection control including proper hand washing and proper equipment cleaning during exercise session. Flowsheet Row Cardiac Rehab from 07/23/2016 in [facility] Cardiac and Pulmonary Rehab  Date  06/21/16  Educator  C. EnterkinRN  Instruction Review Code  2-  meets goals/outcomes      Falls Prevention: - Provides verbal and written material to individual with discussion of falls prevention and safety. Flowsheet Row Cardiac Rehab from 07/23/2016 in [facility] Cardiac and Pulmonary Rehab  Date  06/21/16  Educator  C. [place]  Instruction Review Code  2- meets goals/outcomes      Diabetes: - Individual verbal and written instruction to review signs/symptoms of diabetes, desired ranges of glucose level fasting, after meals and with exercise. Advice that pre and post exercise glucose checks will be done for 3 sessions at entry of program.    Knowledge Questionnaire Score:     Knowledge Questionnaire Score - 06/21/16 1435      Knowledge Questionnaire Score   Pre Score 26      Core Components/Risk Factors/Patient Goals at Admission:     Personal Goals and Risk Factors at Admission - 06/21/16 1440      Core Components/Risk Factors/Patient Goals on Admission   Increase Strength and Stamina Yes   Intervention Provide advice, education, support and counseling about physical activity/exercise needs.;Develop an individualized exercise prescription for aerobic and resistive training based on initial evaluation findings, risk stratification, comorbidities and participant's personal goals.   Expected Outcomes Achievement of increased cardiorespiratory fitness and enhanced flexibility, muscular endurance and strength shown through measurements of functional capacity and personal statement of participant.   Hypertension Yes   Intervention Provide education on lifestyle modifcations including regular physical activity/exercise, weight management, moderate sodium restriction and increased consumption of fresh fruit, vegetables, and low fat dairy, alcohol moderation, and smoking cessation.;Monitor prescription use compliance.   Expected Outcomes Short Term: Continued assessment and intervention until BP is < 140/3m HG in hypertensive participants. <  130/824mHG in hypertensive participants with diabetes, heart failure or chronic kidney disease.;Long Term: Maintenance of blood pressure at goal levels.   Lipids Yes   Intervention Provide education and support for participant on nutrition & aerobic/resistive exercise along with prescribed medications to achieve LDL '70mg'$ , HDL >'40mg'$ .   Expected Outcomes Short Term: Participant states understanding of desired cholesterol values and is compliant with medications prescribed. Participant is following exercise prescription and nutrition guidelines.;Long Term: Cholesterol controlled with medications as prescribed, with individualized exercise RX and with personalized nutrition plan. Value goals: LDL < '70mg'$ , HDL > 40 mg.      Core Components/Risk Factors/Patient Goals Review:      Goals and Risk Factor Review    Row Name 07/12/16 1721 08/06/16 1409 09/27/16 1709         Core Components/Risk Factors/Patient Goals Review   Personal Goals Review Weight Management/Obesity;Hypertension;Lipids  - Hypertension;Lipids     Review AmMircas trying to "behave" with watching her diet more closely.  Her blood pressures have been lower than normal.  She is tolerating her statins without problems. AmVeannaaid her blood pressure when she first started on teh Brilinta was 100systolic but is better now at 120/70. AmCeirraaid she "displaced her iliac joints  so didn't use the treadmill last week but did use the other machines without any problems. Ciin reports that her stamina has gotten better since "she pushes her self alot more than if she was exercising on her own. " Bp has been good, cholesterol hasn't been checked recently.  Elaina is exercising at [facility] water exercise twice per week and the fitness room 3 times per week.     Expected Outcomes Fendi will continue to come to exercise and education classes.  We will continue to monitor for progression. Cont heart healthy lifestyle Niurka will continue to  exercise and maintain her cholesterol and BP at levels her Dr feels are best for her.          Core Components/Risk Factors/Patient Goals at Discharge (Final Review):      Goals and Risk Factor Review - 09/27/16 1709      Core Components/Risk Factors/Patient Goals Review   Personal Goals Review Hypertension;Lipids   Review Bp has been good, cholesterol hasn't been checked recently.  Kallyn is exercising at [facility] water exercise twice per week and the fitness room 3 times per week.   Expected Outcomes Chenille will continue to exercise and maintain her cholesterol and BP at levels her Dr feels are best for her.        ITP Comments:     ITP Comments    Row Name 06/21/16 1438 06/21/16 1442 06/28/16 1812 07/18/16 0709 08/15/16 8299   ITP Comments Yasamin walks with a cane. She uses a rolling walker at homes at times. Aparna reports history that she tripped over something and fell and bruised her leg. 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 Loula also doesn't feel that she needs to meet individually with the Cardiac REhab REgisterd Dietician. "I know what to eat" Dannon reports that she has a family history of depression. Kaityln said she started herself on antidepressants after she asked her husband to leave and she was raising 3 children alone plus private MD practice. First full day of exercise!  Patient was oriented to gym and equipment including functions, settings, policies, and procedures.  Patient's individual exercise prescription and treatment plan were reviewed.  All starting workloads were established based on the results of the 6 minute walk test done at initial orientation visit.  The plan for exercise progression was also introduced and progression will be customized based on patient's performance and goals. 30 day review. Continue with ITP unless changes noted by Medical Director at signature of review.  New to program 30 day review completed  for Medical Director physician review and signature. Continue ITP unless changes made by physician.   Row Name 08/16/16 1719 08/20/16 1746 08/23/16 1423 09/06/16 1119 09/12/16 0631   ITP Comments Patient tolerated exercise well. She did report that she has noticed some irregular heart beats with no pain or symptoms associated with this. She did show some sporadic irregular beats on her EKG strip during exercise. She was advised to talk with her doctor and she stated that she would call him.  Samreet reports that her MD has ordered a heart monitor for her since she has been feeliing some irregular beats. Kolby called today and said she will be out a little bit since she reports that MD wants to find out what is going on with her heart.  Sherae has not attended since 08/20/16. 30 day review completed for review by Dr Bethann Punches.  Continue with  ITP unless changes noted by Dr Sabra Heck. Remains absent. Has a 30 day monitor on.   [place] Name 10/05/16 1031 10/09/16 0643         ITP Comments Briya has been out with a cough and cold symptoms.  She did have a call in to the doctor's office today. 30 day review. Continue with ITP unless directed changes per Medical Director review.         Comments:

## 2016-10-09 NOTE — Plan of Care (Signed)
Problem: Safety: Goal: Ability to remain free from injury will improve Outcome: Progressing Bed alarm , assist to BR, and chair alarm in use

## 2016-10-09 NOTE — Care Management (Addendum)
Presents from [facility]. Independent in all adls, denies issues accessing medical care, obtaining medications or with transportation.  Current with her PCP.  Is not requiring oxygen. Patient will be discharged on new Eliquis.  Provided 30 day coupon.

## 2016-10-09 NOTE — Progress Notes (Signed)
*  PRELIMINARY RESULTS* Echocardiogram 2D Echocardiogram has been performed.  Sherrie Sport 10/09/2016, 11:24 AM

## 2016-10-09 NOTE — Progress Notes (Signed)
Room air. A fib. Pt reports no pain. A & O. Takes meds ok. Up to chair and tolerated it well. Pt has no further concerns at this time.

## 2016-10-09 NOTE — Progress Notes (Signed)
[place] at [facility]                                                                                                                                                                                  Patient Demographics   Charlotte Wagner, is a 82 y.o. female, DOB - 07-Oct-1936, [id number]  Admit date - 10/08/2016   Admitting Physician [name], MD  Outpatient Primary MD for the patient is [name], MD   LOS - 1  Subjective: Pt is complaining sob and cough, intermittent wheezing    Review of Systems:   CONSTITUTIONAL: No documented fever. No fatigue, weakness. No weight gain, no weight loss.  EYES: No blurry or double vision.  ENT: No tinnitus. No postnasal drip. No redness of the oropharynx.  RESPIRATORY: + cough, no wheeze, no hemoptysis. No dyspnea.  CARDIOVASCULAR: No chest pain. No orthopnea. No palpitations. No syncope.  GASTROINTESTINAL: No nausea, no vomiting or diarrhea. No abdominal pain. No melena or hematochezia.  GENITOURINARY: No dysuria or hematuria.  ENDOCRINE: No polyuria or nocturia. No heat or cold intolerance.  HEMATOLOGY: No anemia. No bruising. No bleeding.  INTEGUMENTARY: No rashes. No lesions.  MUSCULOSKELETAL: No arthritis. No swelling. No gout.  NEUROLOGIC: No numbness, tingling, or ataxia. No seizure-type activity.  PSYCHIATRIC: No anxiety. No insomnia. No ADD.    Vitals:   Vitals:   10/09/16 0743 10/09/16 1132 10/09/16 1222 10/09/16 1300  BP: 129/61  (!) 100/59   Pulse: 63  (!) 54 (!) 57  Resp: 18  19   Temp:   97.8 F (36.6 C)   TempSrc:   Oral   SpO2: 95% 97% 98%   Weight:      Height:        Wt Readings from Last 3 Encounters:  10/08/16 181 lb 1.6 oz (82.1 kg)  09/11/16 185 lb 8 oz (84.1 kg)  09/06/16 186 lb 9.6 oz (84.6 kg)     Intake/Output Summary (Last 24 hours) at 10/09/16 1613 Last data filed at 10/09/16 1500  Gross per 24 hour  Intake              240 ml  Output             2950 ml   Net            -2710 ml    Physical Exam:   GENERAL: Pleasant-appearing in no apparent distress.  HEAD, EYES, EARS, NOSE AND THROAT: Atraumatic, normocephalic. Extraocular muscles are intact. Pupils equal and reactive to light. Sclerae anicteric. No conjunctival injection. No oro-pharyngeal erythema.  NECK: Supple. There is no jugular venous distention. No bruits, no  lymphadenopathy, no thyromegaly.  HEART: Regular rate and rhythm,. No murmurs, no rubs, no clicks.  LUNGS: no wheezing. No rales or rhonchi. No wheezes.  ABDOMEN: Soft, flat, nontender, nondistended. Has good bowel sounds. No hepatosplenomegaly appreciated.  EXTREMITIES: No evidence of any cyanosis, clubbing, or peripheral edema.  +2 pedal and radial pulses bilaterally.  NEUROLOGIC: The patient is alert, awake, and oriented x3 with no focal motor or sensory deficits appreciated bilaterally.  SKIN: Moist and warm with no rashes appreciated.  Psych: Not anxious, depressed LN: No inguinal LN enlargement    Antibiotics   Anti-infectives    Start     Dose/Rate Route Frequency Ordered Stop   10/09/16 1800  azithromycin (ZITHROMAX) tablet 500 mg     500 mg Oral Daily 10/09/16 1516     10/09/16 1215  clarithromycin (BIAXIN) tablet 500 mg  Status:  Discontinued     500 mg Oral Every 12 hours 10/09/16 1205 10/09/16 1516      Medications   Scheduled Meds: . amitriptyline  25 mg Oral QHS  . apixaban  5 mg Oral BID  . azithromycin  500 mg Oral Daily  . bisoprolol  2.5 mg Oral QHS  . clopidogrel  75 mg Oral Daily  . ezetimibe  10 mg Oral Daily  . folic acid  1 mg Oral Daily  . furosemide  20 mg Oral BID  . ipratropium  2.5 mL Inhalation Q6H  . loratadine  10 mg Oral Daily  . mometasone-formoterol  2 puff Inhalation BID  . multivitamin  1 tablet Oral Daily  . [START ON 10/10/2016] omega-3 acid ethyl esters  2 g Oral Daily  . pantoprazole  40 mg Oral BID  . potassium chloride  20 mEq Oral BID  . sodium chloride flush  3 mL  Intravenous Q12H  . verapamil  180 mg Oral q morning - 10a  . verapamil  180 mg Oral QHS  . vitamin B-12  1,000 mcg Oral Daily  . vitamin C  500 mg Oral QHS   Continuous Infusions: PRN Meds:.acetaminophen **OR** acetaminophen, hydrocortisone, meclizine, nitroGLYCERIN, ondansetron **OR** ondansetron (ZOFRAN) IV, sucralfate, traMADol, traZODone   Data Review:   Micro Results Recent Results (from the past 240 hour(s))  MRSA PCR Screening     Status: None   Collection Time: 10/08/16 10:24 PM  Result Value Ref Range Status   MRSA by PCR NEGATIVE NEGATIVE Final    Comment:        The GeneXpert MRSA Assay (FDA approved for NASAL specimens only), is one component of a comprehensive MRSA colonization surveillance program. It is not intended to diagnose MRSA infection nor to guide or monitor treatment for MRSA infections.     Radiology Reports Dg Chest 2 View  Result Date: 10/08/2016 CLINICAL DATA:  Patient with sore throat and cough for 7 days. Worsening shortness of breath. EXAM: CHEST  2 VIEW COMPARISON:  Chest radiograph 05/31/2016. FINDINGS: Stable enlarged cardiac and mediastinal contours with tortuosity and calcification of the thoracic aorta status post median sternotomy. No large area of pulmonary consolidation. No pleural effusion or pneumothorax. Thoracic spine degenerative changes. Cholecystectomy clips. Lumbar spinal fusion hardware. Aortic atherosclerosis. IMPRESSION: No acute cardiopulmonary process. Aortic atherosclerosis. Electronically Signed   By: Lovey Newcomer M.D.   On: 10/08/2016 16:18   Ct Chest High Resolution  Result Date: 10/09/2016 CLINICAL DATA:  81 year old female with history of coronary artery disease status post CABG and recent cardiac stent. Shortness of breath even with minimal exertion.  Two pillow orthopnea. EXAM: CT CHEST WITHOUT CONTRAST TECHNIQUE: Multidetector CT imaging of the chest was performed following the standard protocol without intravenous  contrast. High resolution imaging of the lungs, as well as inspiratory and expiratory imaging, was performed. COMPARISON:  No priors. FINDINGS: Cardiovascular: Heart size is mildly enlarged with left atrial dilatation. There is no significant pericardial fluid, thickening or pericardial calcification. There is aortic atherosclerosis, as well as atherosclerosis of the great vessels of the mediastinum and the coronary arteries, including calcified atherosclerotic plaque in the left main, left anterior descending, left circumflex and right coronary arteries. Status post median sternotomy for CABG, including LIMA to the LAD. Mediastinum/Nodes: No pathologically enlarged mediastinal or hilar lymph nodes. Please note that accurate exclusion of hilar adenopathy is limited on noncontrast CT scans. Densely calcified middle mediastinal lymph nodes are incidentally noted. Esophagus is unremarkable in appearance. No axillary lymphadenopathy. Lungs/Pleura: High-resolution images demonstrate no significant regions of ground-glass attenuation, subpleural reticulation, parenchymal banding, traction bronchiectasis or frank honeycombing. Inspiratory and expiratory imaging demonstrates some mild air trapping, indicative of mild small airways disease. There is no acute consolidative airspace disease and no pleural effusions. No definite suspicious appearing pulmonary nodules or masses. Small calcified granuloma in the right lower lobe is incidentally noted. Upper Abdomen: Several well-defined low-attenuation lesions are noted in the liver, incompletely characterized on today's noncontrast CT examination, but favored to represent cysts, largest of which measure up to 4.0 cm in segment 3. Aortic atherosclerosis. Musculoskeletal: Median sternotomy wires. There are no aggressive appearing lytic or blastic lesions noted in the visualized portions of the skeleton. IMPRESSION: 1. No evidence of interstitial lung disease. 2. No acute findings  in the thorax to account for the patient's symptoms. 3. Aortic atherosclerosis, in addition to left main and 3 vessel coronary artery disease. Please note that although the presence of coronary artery calcium documents the presence of coronary artery disease, the severity of this disease and any potential stenosis cannot be assessed on this non-gated CT examination. Assessment for potential risk factor modification, dietary therapy or pharmacologic therapy may be warranted, if clinically indicated. Status post median sternotomy for CABG, including LIMA to the LAD. 4. Mild cardiomegaly with left atrial dilatation. 5. **An incidental finding of potential clinical significance has been found. Multiple indeterminate low-attenuation liver lesions measuring up to 4 cm in diameter are strongly favored to represent cysts. However, these are incompletely characterized on today's noncontrast CT examination, and further characterization with nonemergent abdominal MRI with and without IV gadolinium is recommended in the near future to ensure the benign nature of these findings. This recommendation follows ACR consensus guidelines: Management of Incidental Liver Lesions on CT: A White Paper of the ACR Incidental Findings Committee. J Am Coll Radiol 2017;14:1429-1437.** Electronically Signed   By: Vinnie Langton M.D.   On: 10/09/2016 11:19     CBC  Recent Labs Lab 10/08/16 1518 10/09/16 0251  WBC 3.6 5.1  HGB 12.2 11.9*  HCT 35.0 33.8*  PLT 202 223  MCV 99.3 98.8  MCH 34.5* 34.9*  MCHC 34.7 35.4  RDW 12.7 12.5  LYMPHSABS 0.5*  --   MONOABS 0.6  --   EOSABS 0.0  --   BASOSABS 0.0  --     Chemistries   Recent Labs Lab 10/08/16 1518 10/08/16 1903 10/09/16 0251  NA 125*  --  131*  K 4.2  --  3.6  CL 94*  --  97*  CO2 22  --  23  GLUCOSE 142*  --  175*  BUN 14  --  16  CREATININE 0.95 1.00 1.23*  CALCIUM 9.1  --  9.3  MG  --   --  1.9  AST 33  --   --   ALT 22  --   --   ALKPHOS 68  --   --    BILITOT 0.7  --   --    ------------------------------------------------------------------------------------------------------------------ estimated creatinine clearance is 38.3 mL/min (by C-G formula based on SCr of 1.23 mg/dL (H)). ------------------------------------------------------------------------------------------------------------------ No results for input(s): HGBA1C in the last 72 hours. ------------------------------------------------------------------------------------------------------------------ No results for input(s): CHOL, HDL, LDLCALC, TRIG, CHOLHDL, LDLDIRECT in the last 72 hours. ------------------------------------------------------------------------------------------------------------------ No results for input(s): TSH, T4TOTAL, T3FREE, THYROIDAB in the last 72 hours.  Invalid input(s): FREET3 ------------------------------------------------------------------------------------------------------------------ No results for input(s): VITAMINB12, FOLATE, FERRITIN, TIBC, IRON, RETICCTPCT in the last 72 hours.  Coagulation profile  Recent Labs Lab 10/08/16 1518  INR 0.99    No results for input(s): DDIMER in the last 72 hours.  Cardiac Enzymes  Recent Labs Lab 10/08/16 1903 10/08/16 2223 10/09/16 0251  TROPONINI <0.03 <0.03 <0.03   ------------------------------------------------------------------------------------------------------------------ Invalid input(s): POCBNP    Assessment & Plan   81 year old female with past medical history of coronary artery disease status post bypass, recent cardiac stent placement, polymyalgia rheumatica, history of paroxysmal SVT, history of Raynaud's, spinal stenosis, GERD, coronary artery disease, depression, history of migraines, hyperlipidemia who presents to the hospital due to shortness of breath and cough and noted to be new onset atrial fibrillation.  1. CHF-acute CHF, Suspect diastolic in nature with  echocardiogram creatinine is bumped up we'll stop IV Lasix changed to oral Lasix  2. New onset atrial fibrillation-currently rate controlled. Continue bisoprolol, verapamil.  3. History of coronary disease status post bypass-patient had a repeat recent drug-eluting stent to one of her grafts, it was the SVG to the RCA. - change plavix, beta blocker  4. Essential hypertension-continue ramipril, Cardizem, bisoprolol.  5. History of paroxysmal SVT/multiple PVCs-continue beta blocker, Cardizem  6. GERD-continue Protonix.     Code Status Orders        Start     Ordered   10/08/16 1834  Full code  Continuous     10/08/16 1833    Code Status History    Date Active Date Inactive Code Status Order ID Comments User Context   06/07/2016 10:22 AM 06/07/2016  5:24 PM Full Code [id number]  Nicholes Mango, MD Inpatient   06/06/2016 10:11 PM 06/07/2016 10:21 AM DNR [id number]  Max Sane, MD ED   06/01/2016 12:19 AM 06/01/2016  1:56 PM Full Code [id number]  Lance Coon, MD Inpatient    Advance Directive Documentation   Flowsheet Row Most Recent Value  Type of Advance Directive  Healthcare Power of Attorney  Pre-existing out of facility DNR order (yellow form or pink MOST form)  No data  "MOST" Form in Place?  No data           Consults cardiology  DVT Prophylaxis   eliqus  Lab Results  Component Value Date   PLT 223 10/09/2016     Time Spent in minutes   63min  Greater than 50% of time spent in care coordination and counseling patient regarding the condition and plan of care.   Dustin Flock M.D on 10/09/2016 at 4:13 PM  Between 7am to 6pm - Pager - [phone]  After 6pm go to [email or web address] - password [email or web address] [facility]   Office  [phone]

## 2016-10-10 ENCOUNTER — Telehealth: Payer: Self-pay | Admitting: Internal Medicine

## 2016-10-10 DIAGNOSIS — I481 Persistent atrial fibrillation: Secondary | ICD-10-CM

## 2016-10-10 LAB — BASIC METABOLIC PANEL
ANION GAP: 8 (ref 5–15)
BUN: 25 mg/dL — ABNORMAL HIGH (ref 6–20)
CHLORIDE: 97 mmol/L — AB (ref 101–111)
CO2: 25 mmol/L (ref 22–32)
Calcium: 9.6 mg/dL (ref 8.9–10.3)
Creatinine, Ser: 1.26 mg/dL — ABNORMAL HIGH (ref 0.44–1.00)
GFR calc non Af Amer: 40 mL/min — ABNORMAL LOW (ref >60)
GFR, EST AFRICAN AMERICAN: 46 mL/min — AB (ref >60)
Glucose, Bld: 127 mg/dL — ABNORMAL HIGH (ref 65–99)
POTASSIUM: 4.3 mmol/L (ref 3.5–5.1)
Sodium: 130 mmol/L — ABNORMAL LOW (ref 135–145)

## 2016-10-10 LAB — CBC
HCT: 36.1 % (ref 35.0–47.0)
HEMOGLOBIN: 12.7 g/dL (ref 12.0–16.0)
MCH: 34.7 pg — AB (ref 26.0–34.0)
MCHC: 35.2 g/dL (ref 32.0–36.0)
MCV: 98.5 fL (ref 80.0–100.0)
Platelets: 265 10*3/uL (ref 150–440)
RBC: 3.67 MIL/uL — AB (ref 3.80–5.20)
RDW: 12.2 % (ref 11.5–14.5)
WBC: 5.3 10*3/uL (ref 3.6–11.0)

## 2016-10-10 MED ORDER — POTASSIUM CHLORIDE CRYS ER 20 MEQ PO TBCR: 20.0000 meq | EXTENDED_RELEASE_TABLET | Freq: Every day | ORAL | Status: DC

## 2016-10-10 MED ORDER — APIXABAN 5 MG PO TABS: 5.0000 mg | ORAL_TABLET | Freq: Two times a day (BID) | ORAL | 0 refills | Status: DC

## 2016-10-10 MED ORDER — CLOPIDOGREL BISULFATE 75 MG PO TABS: 75.0000 mg | ORAL_TABLET | Freq: Every day | ORAL | 0 refills | Status: DC

## 2016-10-10 MED ORDER — VERAPAMIL HCL ER 180 MG PO TBCR: 180.0000 mg | EXTENDED_RELEASE_TABLET | Freq: Every day | ORAL | 0 refills | Status: DC

## 2016-10-10 MED ORDER — AZITHROMYCIN 500 MG PO TABS: 500.0000 mg | ORAL_TABLET | Freq: Every day | ORAL | 0 refills | Status: DC

## 2016-10-10 MED ORDER — VERAPAMIL HCL ER 180 MG PO TBCR: 180.0000 mg | EXTENDED_RELEASE_TABLET | Freq: Every morning | ORAL | 0 refills | Status: DC

## 2016-10-10 NOTE — Progress Notes (Signed)
Patient: Charlotte Wagner / Admit Date: 10/08/2016 / Date of Encounter: 10/10/2016, 10:25 AM   Hospital Problem List     Principal Problem:   Acute respiratory distress Active Problems:   CAD (coronary artery disease)   Essential hypertension, benign   Palpitations   Dyspnea   Anxiety   Acute on chronic diastolic CHF (congestive heart failure) (HCC)   Atrial fibrillation (HCC)   Hyponatremia     Subjective   Reports that she feels better this morning, did not wake up with shortness of breath overnight Significant diuresis with IV Lasix She attributes breathing to Brilinta, less to the CHF She did have a pound weight gain over Christmas likely dietary indiscretion CHF likely exacerbated by atrial fibrillation She's having nosebleeds this morning Still with productive thick cough, on antibiotics for bronchitis (chronic issue)  Inpatient Medications    Scheduled Meds: . amitriptyline  25 mg Oral QHS  . apixaban  5 mg Oral BID  . azithromycin  500 mg Oral Daily  . bisoprolol  2.5 mg Oral QHS  . clopidogrel  75 mg Oral Daily  . ezetimibe  10 mg Oral Daily  . folic acid  1 mg Oral Daily  . furosemide  20 mg Oral BID  . ipratropium  2.5 mL Inhalation Q6H  . loratadine  10 mg Oral Daily  . mometasone-formoterol  2 puff Inhalation BID  . multivitamin  1 tablet Oral Daily  . omega-3 acid ethyl esters  2 g Oral Daily  . pantoprazole  40 mg Oral BID  . potassium chloride  20 mEq Oral Daily  . sodium chloride flush  3 mL Intravenous Q12H  . verapamil  180 mg Oral q morning - 10a  . verapamil  180 mg Oral QHS  . vitamin B-12  1,000 mcg Oral Daily  . vitamin C  500 mg Oral QHS   Continuous Infusions:  PRN Meds: acetaminophen / acetaminophen, hydrocortisone, meclizine, nitroGLYCERIN, ondansetron / ondansetron (ZOFRAN) IV, sucralfate, traMADol, traZODone   Objective: Telemetry:  Physical Exam: Blood pressure 113/67, pulse 71, temperature 98.3 F (36.8 C), temperature  source Oral, resp. rate 18, height 5\' 3"  (1.6 m), weight 181 lb 1.6 oz (82.1 kg), SpO2 93 %. Body mass index is 32.08 kg/m. General: Well developed, well nourished, in no acute distress.Lots of coughing this morning, productive Head: Normocephalic, atraumatic, sclera non-icteric, no xanthomas, nares are without discharge.          Neck: Negative for carotid bruits. JVD not elevated. Lungs: Relatively clear with scant rales,  Breathing is unlabored. Heart: Irregularly irregular, with S1 S2. No murmurs, rubs, or gallops appreciated. Abdomen: Soft, non-tender, non-distended with normoactive bowel sounds. No hepatomegaly. No rebound/guarding. No obvious abdominal masses. Msk:  Strength and tone appear normal for age. Extremities: No clubbing or cyanosis. No edema. Distal pedal pulses are 2+ and equal bilaterally. Neuro: Alert and oriented X 3. No facial asymmetry. No focal deficit. Moves all extremities spontaneously. Psych:  Responds to questions appropriately with a normal affect.  Intake/Output Summary (Last 24 hours) at 10/10/16 1025 Last data filed at 10/10/16 1013  Gross per 24 hour  Intake              720 ml  Output             2700 ml  Net            -1980 ml     Labs: CBC  Recent Labs  10/08/16 1518  10/09/16 0251 10/10/16 0424  WBC 3.6 5.1 5.3  NEUTROABS 2.5  --   --   HGB 12.2 11.9* 12.7  HCT 35.0 33.8* 36.1  MCV 99.3 98.8 98.5  PLT 202 223 [id number]   Basic Metabolic Panel  Recent Labs  10/09/16 0251 10/10/16 0424  NA 131* 130*  K 3.6 4.3  CL 97* 97*  CO2 23 25  GLUCOSE 175* 127*  BUN 16 25*  CREATININE 1.23* 1.26*  CALCIUM 9.3 9.6  MG 1.9  --    Liver Function Tests  Recent Labs  10/08/16 1518  AST 33  ALT 22  ALKPHOS 68  BILITOT 0.7  PROT 6.9  ALBUMIN 4.0   No results for input(s): LIPASE, AMYLASE in the last 72 hours. Cardiac Enzymes  Recent Labs  10/08/16 1903 10/08/16 2223 10/09/16 0251  TROPONINI <0.03 <0.03 <0.03   BNP Invalid  input(s): POCBNP D-Dimer No results for input(s): DDIMER in the last 72 hours. Hemoglobin A1C No results for input(s): HGBA1C in the last 72 hours. Fasting Lipid Panel No results for input(s): CHOL, HDL, LDLCALC, TRIG, CHOLHDL, LDLDIRECT in the last 72 hours. Thyroid Function Tests No results for input(s): TSH, T4TOTAL, T3FREE, THYROIDAB in the last 72 hours.  Invalid input(s): FREET3  Weights: Filed Weights   10/08/16 1515 10/08/16 1931  Weight: 192 lb (87.1 kg) 181 lb 1.6 oz (82.1 kg)     Radiology/Studies:  Dg Chest 2 View  Result Date: 10/08/2016 CLINICAL DATA:  Patient with sore throat and cough for 7 days. Worsening shortness of breath. EXAM: CHEST  2 VIEW COMPARISON:  Chest radiograph 05/31/2016. FINDINGS: Stable enlarged cardiac and mediastinal contours with tortuosity and calcification of the thoracic aorta status post median sternotomy. No large area of pulmonary consolidation. No pleural effusion or pneumothorax. Thoracic spine degenerative changes. Cholecystectomy clips. Lumbar spinal fusion hardware. Aortic atherosclerosis. IMPRESSION: No acute cardiopulmonary process. Aortic atherosclerosis. Electronically Signed   By: Lovey Newcomer M.D.   On: 10/08/2016 16:18   Ct Chest High Resolution  Result Date: 10/09/2016 CLINICAL DATA:   IMPRESSION: 1. No evidence of interstitial lung disease. 2. No acute findings in the thorax to account for the patient's symptoms. 3. Aortic atherosclerosis, in addition to left main and 3 vessel coronary artery disease. Please note that although the presence of coronary artery calcium documents the presence of coronary artery disease, the severity of this disease and any potential stenosis cannot be assessed on this non-gated CT examination. Assessment for potential risk factor modification, dietary therapy or pharmacologic therapy may be warranted, if clinically indicated. Status post median sternotomy for CABG, including LIMA to the LAD. 4. Mild  cardiomegaly with left atrial dilatation. 5. **An incidental finding of potential clinical significance has been found. Multiple indeterminate low-attenuation liver lesions measuring up to 4 cm in diameter are strongly favored to represent cysts. However, these are incompletely characterized on today's noncontrast CT examination, and further characterization with nonemergent abdominal MRI with and without IV gadolinium is recommended in the near future to ensure the benign nature of these findings. This recommendation follows ACR consensus guidelines: Management of Incidental Liver Lesions on CT: A White Paper of the ACR Incidental Findings Committee. J Am Coll Radiol 2017;14:1429-1437.** Electronically Signed   By: Vinnie Langton M.D.   On: 10/09/2016 11:19     Assessment and Plan  81 y.o. female   --Atrial fibrillation, paroxysmal Unclear if she has been having atrial fibrillation as an outpatient causing her shortness of breath She attributes  shortness of breath to the [facility] to Elevated CHADS VASC,Continue  eliquis 5 mg twice a day  Brilinta  changed to Plavix, she is not on aspirin as she has GI upset, history of ulcers  Would recommend one month of anticoagulation then consider cardioversion if she remains in atrial fibrillation If she has recurrent episodes of arrhythmia, may benefit from antiarrhythmic medication given frequent PVCs  ------ Shortness of breath Denies any further shortness of breath after aggressive diuresis. She attributes the improvement in symptoms to holding the Brilinta Unable to exclude component of Acute diastolic CHF given 8 pound weight gain over Christmas She attributes her symptoms to Brilinta --Agree with Lasix 20 with potassium daily  --monitor weight daily as an outpatient Reluctant to take diuretics as an outpatient given cramping. She has high soda intake  For cramping may need to take magnesium and potassium  ---cad/CABG: History of bypass, recent  stent to vein graft Change Brilinta to Plavix, hold aspirin, start eliquis for atrial fibrillation No further ischemia workup at this time  --- Encounter for anticoagulation discussion Having nosebleeds this morning on Plavix and eliquis. Likely exacerbated by recent upper respiratory infection  Continue  eliquis 5 mg twice a day  and Plavix  She does not want warfarin   long discussion with her concerning issues above  Total encounter time more than 25 minutes  Greater than 50% was spent in counseling and coordination of care with the patient   Signed, Esmond Plants, MD, Ph.D. [facility] 10/10/2016, 10:25 AM

## 2016-10-10 NOTE — Telephone Encounter (Signed)
Charlotte Wagner from [facility] called and needs to schedule pt for 1 week hospital follow up. Pt was in for CHF. Pt is being discharged today.  Call pt @ [phone]

## 2016-10-10 NOTE — Progress Notes (Signed)
Discharge instructions given to patient. Education given on new medications, afib, and heart failure. Questions answered and patient verbalized understanding. Patient is waiting on her ride and wants to wait a little while to get dressed. Will remove IV and tele once ride is on the way.

## 2016-10-10 NOTE — Telephone Encounter (Signed)
Hospital requesting HFU with in a week for CHF patient has cardiologist Dr. Yvone Neu need appointment time for TCM. Discharge summary is not in chart at this time.

## 2016-10-10 NOTE — Telephone Encounter (Signed)
I am fine to see her.  She has a f/u scheduled with her cardiologist 10/11/16.  Does she feels she needs a f/u with me now or does she want to extend out from visit with cardiology.  I am fine either way.  Thanks

## 2016-10-10 NOTE — Progress Notes (Signed)
IV and tele removed. Ride is taking patient home now.

## 2016-10-10 NOTE — Discharge Summary (Signed)
[facility] [place] at [facility]amed, 81 y.o., DOB 1936-03-28, MRN [id number]. Admission date: 10/08/2016 Discharge Date 10/10/2016 Primary MD [name], MD Admitting Physician [name], MD  Admission Diagnosis  Hyponatremia [E87.1] SOB (shortness of breath) [R06.02] Acute congestive heart failure, unspecified congestive heart failure type ([place]) [I50.9] Atrial fibrillation, unspecified type ([place]) [I48.91] Dyspnea, unspecified type [R06.00]  Discharge Diagnosis   Principal Problem:   Acute respiratory distressdue to acute asthma exacerbation Atrial fibrillation new onset   CAD (coronary artery disease)   Essential hypertension, benign   Palpitations   Dyspnea   Anxiety   Acute on chronic diastolic CHF (congestive heart failure) ([place])   Atrial fibrillation ([place])   Hyponatremia           Hospital Course patient 81 year old female with known history of coronary artery disease, chronic asthma and chronic kidney disease who presented with complaining of shortness of breath and cough. Patient was thought to have acute on chronic diastolic CHF as well as possible ashtma exaceberation,  She was treated with IV Lasix Improvement in her symptoms however creatinine started increasing therefore her Lasix has been discontinued. Patient was offered IV Solu-Medrol but she stated that she cannot take steroids. I also offered her nebulizer therapy on discharge she reported that she did not want those. Patient is a 79 had practice medicine in the past. She also underwent a CT scan of the chest has switched showed no significant pulmonary disease, however she does need outpatient pulmonary function test. He was also noticed to have atrial fibrillation and was started on anticoagulati eliquis per cardiology.             Consults  cardiology  Significant Tests:  See full reports for all details     Dg Chest 2 View  Result Date:  10/08/2016 CLINICAL DATA:  Patient with sore throat and cough for 7 days. Worsening shortness of breath. EXAM: CHEST  2 VIEW COMPARISON:  Chest radiograph 05/31/2016. FINDINGS: Stable enlarged cardiac and mediastinal contours with tortuosity and calcification of the thoracic aorta status post median sternotomy. No large area of pulmonary consolidation. No pleural effusion or pneumothorax. Thoracic spine degenerative changes. Cholecystectomy clips. Lumbar spinal fusion hardware. Aortic atherosclerosis. IMPRESSION: No acute cardiopulmonary process. Aortic atherosclerosis. Electronically Signed   By: Lovey Newcomer M.D.   On: 10/08/2016 16:18   Ct Chest High Resolution  Result Date: 10/09/2016 CLINICAL DATA:  81 year old female with history of coronary artery disease status post CABG and recent cardiac stent. Shortness of breath even with minimal exertion. Two pillow orthopnea. EXAM: CT CHEST WITHOUT CONTRAST TECHNIQUE: Multidetector CT imaging of the chest was performed following the standard protocol without intravenous contrast. High resolution imaging of the lungs, as well as inspiratory and expiratory imaging, was performed. COMPARISON:  No priors. FINDINGS: Cardiovascular: Heart size is mildly enlarged with left atrial dilatation. There is no significant pericardial fluid, thickening or pericardial calcification. There is aortic atherosclerosis, as well as atherosclerosis of the great vessels of the mediastinum and the coronary arteries, including calcified atherosclerotic plaque in the left main, left anterior descending, left circumflex and right coronary arteries. Status post median sternotomy for CABG, including LIMA to the LAD. Mediastinum/Nodes: No pathologically enlarged mediastinal or hilar lymph nodes. Please note that accurate exclusion of hilar adenopathy is limited on noncontrast CT scans. Densely calcified middle mediastinal lymph nodes are incidentally noted. Esophagus is unremarkable in appearance.  No axillary lymphadenopathy. Lungs/Pleura: High-resolution images demonstrate no significant  regions of ground-glass attenuation, subpleural reticulation, parenchymal banding, traction bronchiectasis or frank honeycombing. Inspiratory and expiratory imaging demonstrates some mild air trapping, indicative of mild small airways disease. There is no acute consolidative airspace disease and no pleural effusions. No definite suspicious appearing pulmonary nodules or masses. Small calcified granuloma in the right lower lobe is incidentally noted. Upper Abdomen: Several well-defined low-attenuation lesions are noted in the liver, incompletely characterized on today's noncontrast CT examination, but favored to represent cysts, largest of which measure up to 4.0 cm in segment 3. Aortic atherosclerosis. Musculoskeletal: Median sternotomy wires. There are no aggressive appearing lytic or blastic lesions noted in the visualized portions of the skeleton. IMPRESSION: 1. No evidence of interstitial lung disease. 2. No acute findings in the thorax to account for the patient's symptoms. 3. Aortic atherosclerosis, in addition to left main and 3 vessel coronary artery disease. Please note that although the presence of coronary artery calcium documents the presence of coronary artery disease, the severity of this disease and any potential stenosis cannot be assessed on this non-gated CT examination. Assessment for potential risk factor modification, dietary therapy or pharmacologic therapy may be warranted, if clinically indicated. Status post median sternotomy for CABG, including LIMA to the LAD. 4. Mild cardiomegaly with left atrial dilatation. 5. **An incidental finding of potential clinical significance has been found. Multiple indeterminate low-attenuation liver lesions measuring up to 4 cm in diameter are strongly favored to represent cysts. However, these are incompletely characterized on today's noncontrast CT examination, and  further characterization with nonemergent abdominal MRI with and without IV gadolinium is recommended in the near future to ensure the benign nature of these findings. This recommendation follows ACR consensus guidelines: Management of Incidental Liver Lesions on CT: A White Paper of the ACR Incidental Findings Committee. J Am Coll Radiol 2017;14:1429-1437.** Electronically Signed   By: Vinnie Langton M.D.   On: 10/09/2016 11:19       Today   Subjective:   Charlotte Wagner  Healing better has some cough  Objective:   Blood pressure 113/67, pulse 71, temperature 98.3 F (36.8 C), temperature source Oral, resp. rate 18, height 5\' 3"  (1.6 m), weight 181 lb 1.6 oz (82.1 kg), SpO2 96 %.  .  Intake/Output Summary (Last 24 hours) at 10/10/16 1422 Last data filed at 10/10/16 1013  Gross per 24 hour  Intake              480 ml  Output             2700 ml  Net            -2220 ml    Exam VITAL SIGNS: Blood pressure 113/67, pulse 71, temperature 98.3 F (36.8 C), temperature source Oral, resp. rate 18, height 5\' 3"  (1.6 m), weight 181 lb 1.6 oz (82.1 kg), SpO2 96 %.  GENERAL:  81 y.o.-year-old patient lying in the bed with no acute distress.  EYES: Pupils equal, round, reactive to light and accommodation. No scleral icterus. Extraocular muscles intact.  HEENT: Head atraumatic, normocephalic. Oropharynx and nasopharynx clear.  NECK:  Supple, no jugular venous distention. No thyroid enlargement, no tenderness.  LUNGS: Normal breath sounds bilaterally, no wheezing, rales,rhonchi or crepitation. No use of accessory muscles of respiration.  CARDIOVASCULAR: irregularly regular. No murmurs, rubs, or gallops.  ABDOMEN: Soft, nontender, nondistended. Bowel sounds present. No organomegaly or mass.  EXTREMITIES: No pedal edema, cyanosis, or clubbing.  NEUROLOGIC: Cranial nerves II through XII are intact. Muscle strength 5/5 in  all extremities. Sensation intact. Gait not checked.  PSYCHIATRIC: The  patient is alert and oriented x 3.  SKIN: No obvious rash, lesion, or ulcer.   Data Review     CBC w Diff: Lab Results  Component Value Date   WBC 5.3 10/10/2016   HGB 12.7 10/10/2016   HCT 36.1 10/10/2016   PLT 265 10/10/2016   LYMPHOPCT 13 10/08/2016   MONOPCT 17 10/08/2016   EOSPCT 1 10/08/2016   BASOPCT 0 10/08/2016   CMP: Lab Results  Component Value Date   NA 130 (L) 10/10/2016   K 4.3 10/10/2016   CL 97 (L) 10/10/2016   CO2 25 10/10/2016   BUN 25 (H) 10/10/2016   CREATININE 1.26 (H) 10/10/2016   PROT 6.9 10/08/2016   ALBUMIN 4.0 10/08/2016   BILITOT 0.7 10/08/2016   ALKPHOS 68 10/08/2016   AST 33 10/08/2016   ALT 22 10/08/2016  .  Micro Results Recent Results (from the past 240 hour(s))  MRSA PCR Screening     Status: None   Collection Time: 10/08/16 10:24 PM  Result Value Ref Range Status   MRSA by PCR NEGATIVE NEGATIVE Final    Comment:        The GeneXpert MRSA Assay (FDA approved for NASAL specimens only), is one component of a comprehensive MRSA colonization surveillance program. It is not intended to diagnose MRSA infection nor to guide or monitor treatment for MRSA infections.         Code Status Orders        Start     Ordered   10/08/16 1834  Full code  Continuous     10/08/16 1833    Code Status History    Date Active Date Inactive Code Status Order ID Comments User Context   06/07/2016 10:22 AM 06/07/2016  5:24 PM Full Code [id number]  Nicholes Mango, MD Inpatient   06/06/2016 10:11 PM 06/07/2016 10:21 AM DNR [id number]  Max Sane, MD ED   06/01/2016 12:19 AM 06/01/2016  1:56 PM Full Code [id number]  Lance Coon, MD Inpatient    Advance Directive Documentation   Flowsheet Row Most Recent Value  Type of Advance Directive  Healthcare Power of Attorney  Pre-existing out of facility DNR order (yellow form or pink MOST form)  No data  "MOST" Form in Place?  No data          Follow-up Information    Alisa Graff, FNP. Go on  10/19/2016.   Specialty:  Family Medicine Why:  at 11:30am to the [facility] Contact information: [place] [address] [address] [phone]        [name], MD. Schedule an appointment as soon as possible for a visit in 7 day(s).   Specialty:  Internal Medicine Why:  THIS OFFICE WILL CALL YOU WITH AN APPOINTMENT  Contact information: [id number] [place] [address] [phone]        Wende Bushy, MD. Go on 10/11/2016.   Specialty:  Cardiology Why:  PLEASE KEEP AND GO TO APPOINTMENT ALREADY SCHEDULED FOR THURSDAY 10/11/16 AT 3:00PM Contact information: [place] [address] [phone]           Discharge Medications   Allergies as of 10/10/2016      Reactions   Beta Adrenergic Blockers Shortness Of Breath   Albuterol    rigors   Aspirin Other (See Comments)   Heartburn    Penicillins Hives, Other (See  Comments)   Has patient had a PCN reaction causing immediate rash, facial/tongue/throat swelling, SOB or lightheadedness with hypotension: No Has patient had a PCN reaction causing severe rash involving mucus membranes or skin necrosis: No Has patient had a PCN reaction that required hospitalization No Has patient had a PCN reaction occurring within the last 10 years: No If all of the above answers are "NO", then may proceed with Cephalosporin use.   Statins Other (See Comments)   Muscle weakness   Sulfa Antibiotics Rash   At mouth   Tetracycline Rash   Tetracyclines & Related Rash      Medication List    STOP taking these medications   BRILINTA 90 MG Tabs tablet Generic drug:  ticagrelor   clarithromycin 500 MG tablet Commonly known as:  BIAXIN   ticagrelor 90 MG Tabs tablet Commonly known as:  BRILINTA     TAKE these medications   acetaminophen 500 MG tablet Commonly known as:  TYLENOL Take 1,000 mg by mouth 2 (two) times daily.   amitriptyline 25 MG  tablet Commonly known as:  ELAVIL Take 1-2 tablets (25-50 mg total) by mouth at bedtime. What changed:  how much to take   apixaban 5 MG Tabs tablet Commonly known as:  ELIQUIS Take 1 tablet (5 mg total) by mouth 2 (two) times daily.   azithromycin 500 MG tablet Commonly known as:  ZITHROMAX Take 1 tablet (500 mg total) by mouth daily.   b complex vitamins tablet Take 1 tablet by mouth daily.   bisoprolol 5 MG tablet Commonly known as:  ZEBETA Take 1 tablet (5 mg total) by mouth at bedtime. What changed:  how much to take   clopidogrel 75 MG tablet Commonly known as:  PLAVIX Take 1 tablet (75 mg total) by mouth daily.   CoQ-10 100 MG Caps Take 1 tablet by mouth daily.   ezetimibe 10 MG tablet Commonly known as:  ZETIA take 1 tablet by mouth once daily   FISH OIL PO Take 2,000 mg by mouth daily.   Fluticasone-Salmeterol 250-50 MCG/DOSE Aepb Commonly known as:  ADVAIR Inhale 1 puff into the lungs 2 (two) times daily as needed.   folic acid 1 MG tablet Commonly known as:  FOLVITE take 1 tablet by mouth once daily   HAIR/SKIN/NAILS PO Take 1 tablet by mouth daily.   hydrocortisone 2.5 % cream apply to VULVA twice a day AT 10AM AND 4 PM   ipratropium 17 MCG/ACT inhaler Commonly known as:  ATROVENT HFA Inhale 1 puff into the lungs every 4 (four) hours as needed for wheezing.   ketoconazole 2 % cream Commonly known as:  NIZORAL Apply 1 application topically daily. What changed:  when to take this  reasons to take this   loratadine 10 MG tablet Commonly known as:  CLARITIN Take 10 mg by mouth daily.   meclizine 25 MG tablet Commonly known as:  ANTIVERT Take 25 mg by mouth 3 (three) times daily as needed for dizziness.   miconazole 2 % cream Commonly known as:  MICOTIN Apply 1 application topically 2 (two) times daily as needed (irritation).   MIRALAX PO Take 8.5 g by mouth at bedtime.   nitroGLYCERIN 0.4 MG SL tablet Commonly known as:   NITROSTAT Place 1 tablet (0.4 mg total) under the tongue every 5 (five) minutes as needed for chest pain. Place 1 tablet (0.4 mg total) under the tongue every 5 (five) minutes , 3 times as needed for chest pain.  pantoprazole 40 MG tablet Commonly known as:  PROTONIX take 1 tablet by mouth twice a day   ramipril 10 MG capsule Commonly known as:  ALTACE take 1 capsule by mouth once daily   spironolactone 25 MG tablet Commonly known as:  ALDACTONE take 1 tablet by mouth every morning   sucralfate 1 g tablet Commonly known as:  CARAFATE Take 1 g by mouth 2 (two) times daily as needed (acid reflux).   traMADol 50 MG tablet Commonly known as:  ULTRAM Take 1 tablet (50 mg total) by mouth daily as needed. What changed:  reasons to take this   traZODone 150 MG tablet Commonly known as:  DESYREL take 1 and 2/3 tablets to 2 tablets by mouth at bedtime as directed   verapamil 180 MG CR tablet Commonly known as:  CALAN-SR Take 1 tablet (180 mg total) by mouth every morning. What changed:  medication strength  how much to take  when to take this   verapamil 180 MG CR tablet Commonly known as:  CALAN-SR Take 1 tablet (180 mg total) by mouth at bedtime. What changed:  You were already taking a medication with the same name, and this prescription was added. Make sure you understand how and when to take each.   vitamin B-12 1000 MCG tablet Commonly known as:  CYANOCOBALAMIN Take 1,000 mcg by mouth daily.   vitamin C 500 MG tablet Commonly known as:  ASCORBIC ACID Take 500 mg by mouth at bedtime.   Vitamin D3 2000 units Tabs Take 1 capsule by mouth daily.          Total Time in preparing paper work, data evaluation and todays exam - 35 minutes  Dustin Flock M.D on 10/10/2016 at 2:22 PM  [facility]   Office  [phone]

## 2016-10-11 ENCOUNTER — Ambulatory Visit (INDEPENDENT_AMBULATORY_CARE_PROVIDER_SITE_OTHER): Payer: Medicare Other | Admitting: Cardiology

## 2016-10-11 ENCOUNTER — Encounter: Payer: Self-pay | Admitting: Cardiology

## 2016-10-11 VITALS — BP 114/64 | HR 69 | Ht 63.0 in | Wt 186.8 lb

## 2016-10-11 DIAGNOSIS — I4819 Other persistent atrial fibrillation: Secondary | ICD-10-CM

## 2016-10-11 DIAGNOSIS — I10 Essential (primary) hypertension: Secondary | ICD-10-CM | POA: Diagnosis not present

## 2016-10-11 DIAGNOSIS — E784 Other hyperlipidemia: Secondary | ICD-10-CM | POA: Diagnosis not present

## 2016-10-11 DIAGNOSIS — E7849 Other hyperlipidemia: Secondary | ICD-10-CM

## 2016-10-11 DIAGNOSIS — I2510 Atherosclerotic heart disease of native coronary artery without angina pectoris: Secondary | ICD-10-CM

## 2016-10-11 DIAGNOSIS — I481 Persistent atrial fibrillation: Secondary | ICD-10-CM | POA: Diagnosis not present

## 2016-10-11 MED ORDER — FUROSEMIDE 20 MG PO TABS: 10.0000 mg | ORAL_TABLET | Freq: Every day | ORAL | 3 refills | Status: DC

## 2016-10-11 NOTE — Progress Notes (Signed)
Cardiology Office Note   Date:  10/11/2016   ID:  [name], Dr., DOB [birth year], MRN [id number]  Referring Doctor:  [name], MD   Cardiologist:   Wende Bushy, MD   Reason for consultation:  Chief Complaint  Patient presents with  . other    3 month f/u c/o CHF. Meds reviewed verbally with pt.   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 She ended up being admitted to the hospital where she was found to be in atrial fibrillation and diastolic congestive heart failure. Her Brilinta was stopped and switched to Plavix. She was started on Eliquis. She was diuresed in the hospital. She reports responding to the diuresis. She is not discharged on Lasix. Since discharge, she states has noted slight worsening of her shortness of breath. She feels that she needs to be on a diuretic.  Records from hospital stay reviewed.  No chest pains. No lightheadedness. No syncope. No PND.   ROS:  Please see the history of present illness. Aside from mentioned under HPI, all other systems are reviewed and negative.     Past Medical History:  Diagnosis Date  . Allergy   . Arthritis    s/p bilateral knees and left hip replacement  . Asthma   . CAD (coronary artery disease)    a. 12/1996 s/p CABG x4 ([place]);  b. 2005 Pt reports stress test & cath, which revealed patent grafts.  . Cancer ([place])    melanoma right arm  . Carotid arterial disease ([place])    a. 04/2015 Carotid U/S: <50% bilat ICA stenosis.  . Chronic kidney disease   . Colon polyps    H/O  . Depression   . GERD (gastroesophageal reflux disease)    h/o hiatal hernia  . Headache    migraines in past  . Heart murmur    a. 04/2011 Echo: EF 55-60%, bilat atrial enlargement, mild to mod TR.  [name] History of chicken pox   . History of hiatal hernia   . Hx of migraines    rare now  . Hx: UTI (urinary tract infection)   . Hyperlipidemia    a. Statin intolerant -->on zetia.  . Hypertensive heart disease   . Lichen planus   . Melanoma ([place])   . Palpitations    a. rare PVC's and h/o SVT.  [name] PMR (polymyalgia rheumatica) (H[facility])    h/o in setting of crestor usage.  [name] PSVT (paroxysmal supraventricular tachycardia) ([place])   . PUD (peptic ulcer disease)    remote history  . Raynaud's phenomenon   . Spinal stenosis   . Urine incontinence    H/O  . Vitamin D deficiency     Past Surgical History:  Procedure Laterality Date  . ADENOIDECTOMY     age 32  . BACK SURGERY     L3-L5  . BILATERAL CARPAL TUNNEL RELEASE    . BREAST BIOPSY Right    bx x 3 neg  . BREAST SURGERY Right    biopsy x 3 (all benign)  . CARDIAC CATHETERIZATION N/A 06/01/2016   Procedure: LEFT HEART CATH AND CORS/GRAFTS ANGIOGRAPHY;  Surgeon: Wellington Hampshire, MD;  Location: [facility];  Service: Cardiovascular;  Laterality: N/A;  . CARDIAC CATHETERIZATION N/A 06/01/2016   Procedure: Coronary Stent Intervention;  Surgeon: Rogue Jury  Ferne Reus, MD;  Location: [facility];  Service: Cardiovascular;  Laterality: N/A;  . CATARACT EXTRACTION W/PHACO Right 01/04/2016   Procedure: CATARACT EXTRACTION PHACO AND INTRAOCULAR LENS PLACEMENT (IOC);  Surgeon: Leandrew Koyanagi, MD;  Location: [facility];  Service: Ophthalmology;  Laterality: Right;  MALYUGIN  . CATARACT EXTRACTION W/PHACO Left 01/25/2016   Procedure: CATARACT EXTRACTION PHACO AND INTRAOCULAR LENS PLACEMENT ([place]) left eye;  Surgeon: Leandrew Koyanagi, MD;  Location: [facility];  Service: Ophthalmology;  Laterality: Left;  MALYUGIN SHUGARCAINE  . CHOLECYSTECTOMY  90's  . COLONOSCOPY WITH PROPOFOL N/A 05/03/2015   Procedure: COLONOSCOPY WITH PROPOFOL;  Surgeon: Lollie Sails, MD;  Location: [facility] ENDOSCOPY;  Service: Endoscopy;  Laterality: N/A;  . CORONARY ARTERY BYPASS GRAFT  98   4 vessel  .  ESOPHAGOGASTRODUODENOSCOPY N/A 03/22/2015   Procedure: ESOPHAGOGASTRODUODENOSCOPY (EGD);  Surgeon: Lollie Sails, MD;  Location: [facility] ENDOSCOPY;  Service: Endoscopy;  Laterality: N/A;  . JOINT REPLACEMENT     BILATERAL KNEE REPLACEMENTS  . KNEE ARTHROSCOPY W/ OATS PROCEDURE     Lt knee (9/01), Rt knee (3/11), Lt hip (5/10)  . TOTAL HIP ARTHROPLASTY       reports that she has quit smoking. She has never used smokeless tobacco. She reports that she does not drink alcohol or use drugs.   family history includes Alcohol abuse in her father; Depression in her daughter, father, maternal grandmother, and son; Heart disease in her father; Hypertension in her maternal grandfather and paternal grandfather; Lung cancer in her sister; Stroke in her maternal grandmother; Thyroid disease in her daughter and mother.   Outpatient Medications Prior to Visit  Medication Sig Dispense Refill  . acetaminophen (TYLENOL) 500 MG tablet Take 1,000 mg by mouth 2 (two) times daily.     [name] amitriptyline (ELAVIL) 25 MG tablet Take 1-2 tablets (25-50 mg total) by mouth at bedtime. (Patient taking differently: Take 25 mg by mouth at bedtime. ) 60 tablet 5  . apixaban (ELIQUIS) 5 MG TABS tablet Take 1 tablet (5 mg total) by mouth 2 (two) times daily. 60 tablet 0  . azithromycin (ZITHROMAX) 500 MG tablet Take 1 tablet (500 mg total) by mouth daily. 5 tablet 0  . b complex vitamins tablet Take 1 tablet by mouth daily.    . bisoprolol (ZEBETA) 5 MG tablet Take 1 tablet (5 mg total) by mouth at bedtime. (Patient taking differently: Take 2.5 mg by mouth at bedtime. ) 30 tablet 3  . Cholecalciferol (VITAMIN D3) 2000 UNITS TABS Take 1 capsule by mouth daily.     . clopidogrel (PLAVIX) 75 MG tablet Take 1 tablet (75 mg total) by mouth daily. 30 tablet 0  . Coenzyme Q10 (COQ-10) 100 MG CAPS Take 1 tablet by mouth daily.    [name] ezetimibe (ZETIA) 10 MG tablet take 1 tablet by mouth once daily 30 tablet 7  . Fluticasone-Salmeterol  (ADVAIR) 250-50 MCG/DOSE AEPB Inhale 1 puff into the lungs 2 (two) times daily as needed.    . folic acid (FOLVITE) 1 MG tablet take 1 tablet by mouth once daily 30 tablet 5  . hydrocortisone 2.5 % cream apply to VULVA twice a day AT 10AM AND 4 PM 28 g 0  . ipratropium (ATROVENT HFA) 17 MCG/ACT inhaler Inhale 1 puff into the lungs every 4 (four) hours as needed for wheezing.    [name] ketoconazole (NIZORAL) 2 % cream Apply 1 application topically daily. (Patient taking differently: Apply 1 application topically daily as  needed for irritation. ) 30 g 0  . loratadine (CLARITIN) 10 MG tablet Take 10 mg by mouth daily.    . meclizine (ANTIVERT) 25 MG tablet Take 25 mg by mouth 3 (three) times daily as needed for dizziness.    . miconazole (MICOTIN) 2 % cream Apply 1 application topically 2 (two) times daily as needed (irritation).    . Multiple Vitamins-Minerals (HAIR/SKIN/NAILS PO) Take 1 tablet by mouth daily.    . nitroGLYCERIN (NITROSTAT) 0.4 MG SL tablet Place 1 tablet (0.4 mg total) under the tongue every 5 (five) minutes as needed for chest pain. Place 1 tablet (0.4 mg total) under the tongue every 5 (five) minutes , 3 times as needed for chest pain. 15 tablet 0  . Omega-3 Fatty Acids (FISH OIL PO) Take 2,000 mg by mouth daily.    . pantoprazole (PROTONIX) 40 MG tablet take 1 tablet by mouth twice a day 60 tablet 5  . Polyethylene Glycol 3350 (MIRALAX PO) Take 8.5 g by mouth at bedtime.     . ramipril (ALTACE) 10 MG capsule take 1 capsule by mouth once daily 30 capsule 3  . spironolactone (ALDACTONE) 25 MG tablet take 1 tablet by mouth every morning 30 tablet 5  . sucralfate (CARAFATE) 1 g tablet Take 1 g by mouth 2 (two) times daily as needed (acid reflux).    . traMADol (ULTRAM) 50 MG tablet Take 1 tablet (50 mg total) by mouth daily as needed. (Patient taking differently: Take 50 mg by mouth daily as needed for moderate pain. ) 20 tablet 0  . traZODone (DESYREL) 150 MG tablet take 1 and 2/3 tablets  to 2 tablets by mouth at bedtime as directed 60 tablet 2  . verapamil (CALAN-SR) 180 MG CR tablet Take 1 tablet (180 mg total) by mouth at bedtime. 30 tablet 0  . vitamin B-12 (CYANOCOBALAMIN) 1000 MCG tablet Take 1,000 mcg by mouth daily.    . vitamin C (ASCORBIC ACID) 500 MG tablet Take 500 mg by mouth at bedtime.    . verapamil (CALAN-SR) 180 MG CR tablet Take 1 tablet (180 mg total) by mouth every morning. (Patient not taking: Reported on 10/11/2016) 30 tablet 0   No facility-administered medications prior to visit.      Allergies: Beta adrenergic blockers; Albuterol; Aspirin; Penicillins; Statins; Sulfa antibiotics; Tetracycline; and Tetracyclines & related    PHYSICAL EXAM: VS:  BP 114/64 (BP Location: Left Arm, Patient Position: Sitting, Cuff Size: Normal)   Pulse 69   Ht 5\' 3"  (1.6 m)   Wt 186 lb 12 oz (84.7 kg)   BMI 33.08 kg/m  , Body mass index is 33.08 kg/m. Wt Readings from Last 3 Encounters:  10/11/16 186 lb 12 oz (84.7 kg)  10/08/16 181 lb 1.6 oz (82.1 kg)  09/11/16 185 lb 8 oz (84.1 kg)    GENERAL:  well developed, well nourished, obese, not in acute distress HEENT: normocephalic, pink conjunctivae, anicteric sclerae, no xanthelasma, normal dentition, oropharynx clear NECK:  no neck vein engorgement, JVP normal, no hepatojugular reflux, carotid upstroke brisk and symmetric, no bruit, no thyromegaly, no lymphadenopathy LUNGS:  good respiratory effort, clear to auscultation bilaterally CV:  PMI not displaced, no thrills, no lifts, irregular, S2 within normal limits, no palpable S3 or S4, no murmurs, no rubs, no gallops ABD:  Soft, nontender, nondistended, normoactive bowel sounds, no abdominal aortic bruit, no hepatomegaly, no splenomegaly, bruise noted on the left lower quadrant MS: nontender back, no kyphosis, no scoliosis,  no joint deformities EXT:  2+ DP/PT pulses, no edema, no varicosities, no cyanosis, no clubbing. RFA is intact, no bruit. Extensive bruising but no  hematoma on the right femoral artery access site --this has since resolved compared to last visit's PE SKIN: warm, nondiaphoretic, normal turgor, no ulcers NEUROPSYCH: alert, oriented to person, place, and time, sensory/motor grossly intact, normal mood, appropriate affect  Recent Labs: 10/08/2016: ALT 22; B Natriuretic Peptide 566.0 10/09/2016: Magnesium 1.9 10/10/2016: BUN 25; Creatinine, Ser 1.26; Hemoglobin 12.7; Platelets 265; Potassium 4.3; Sodium 130   Lipid Panel    Component Value Date/Time   CHOL 221 (H) 03/26/2016 0932   TRIG 119.0 03/26/2016 0932   HDL 54.80 03/26/2016 0932   CHOLHDL 4 03/26/2016 0932   VLDL 23.8 03/26/2016 0932   LDLCALC 143 (H) 03/26/2016 0932   LDLDIRECT 126.7 07/03/2013 0937     Other studies Reviewed:  EKG:  The ekg from 06/12/2016 was personally reviewed by me and it revealed sinus rhythm, 67 BPM. Low-voltage QRS.  EKG from 08/23/2016 was personally reviewed by me and it showed sinus rhythm, 70 BPM, frequent PVCs and bigeminy. Nonspecific ST-T wave changes.  Additional studies/ records that were reviewed personally reviewed by me today include:  Left heart cath 06/01/2016:  Prox LAD lesion, 100 %stenosed.  Ost Cx to Prox Cx lesion, 50 %stenosed.  Ost LM to LM lesion, 70 %stenosed.  Mid RCA lesion, 100 %stenosed.  Prox RCA lesion, 60 %stenosed.  SVG graft was visualized by angiography and is normal in caliber.  Post Atrio lesion, 60 %stenosed.  SVG graft was visualized by angiography and is normal in caliber.  The graft exhibits mild .  The flow in the graft is reversed.  Origin lesion, 30 %stenosed.  SVG graft was visualized by angiography.  Prox Graft lesion, 30 %stenosed.  LIMA graft was visualized by angiography and is normal in caliber and anatomically normal.  The left ventricular systolic function is normal.  LV end diastolic pressure is mildly elevated.  The left ventricular ejection fraction is 55-65% by visual  estimate.  A STENT XIENCE ALPINE RX 3.0X15 drug eluting stent was successfully placed, and does not overlap previously placed stent.  Prox Graft lesion, 90 %stenosed.  Post intervention, there is a 0% residual stenosis.   1. Severe underlying three-vessel coronary artery disease with patent grafts including LIMA to LAD, SVG to diagonal, SVG to left circumflex and SVG to RCA. Severe 90% stenosis and SVG to RCA is likely the culprit for unstable angina.  2. Normal LV systolic function and mildly elevated left ventricular end-diastolic pressure.  3. Successful drug-eluting stent placement to the SVG to RCA using a distal protection device.  Recommendations: The patient is severely intolerant to aspirin and she reports inability to take the medication. Thus, I elected to treat her with Brilinta monotherapy. Continue aggressive treatment for coronary artery disease.  Carotid u/s 04/25/2015, ordered by PCP: Less than 50% stenosis bilateral ICA Severe left ECA disease  Echo 06/19/2016: Left ventricle: The cavity size was normal. Systolic function was   normal. The estimated ejection fraction was in the range of 60%   to 65%. Wall motion was normal; there were no regional wall   motion abnormalities. Left ventricular diastolic function   parameters were normal. - Mitral valve: There was mild regurgitation. - Left atrium: The atrium was at the upper limits of normal in   size. - Right ventricle: Systolic function was normal. - Pulmonary arteries: Systolic pressure was mildly  elevated. PA   peak pressure: 40 mm Hg (S).  Nuclear stress is 08/29/2016:  Pharmacological myocardial perfusion imaging study with no significant  ischemia Normal wall motion, EF estimated at 54% No EKG changes concerning for ischemia at peak stress or in recovery. Low risk scan  Holter 08/28/2016: Overall rhythm was sinus. Heart rate ranged from 43-82 bpm, average of 53 BPM.  Supraventricular ectopy: 61  isolated PACs. One 4 beat run of SVT, likely atrial tachycardia, 110 BPM.  Ventricular ectopy was 15% of the total number of beats: 12,062 isolated PVCs. 54 ventricular couplets. 6165 ventricular bigeminal cycles.  Echo 10/09/2016: Left ventricle: The cavity size was normal. Systolic function was   normal. The estimated ejection fraction was in the range of 60%   to 65%. Wall motion was normal; there were no regional wall   motion abnormalities. The study is not technically sufficient to   allow evaluation of LV diastolic function. - Mitral valve: There was mild regurgitation. - Left atrium: The atrium was moderately dilated. - Right ventricle: The cavity size was mildly dilated. Wall   thickness was normal. Systolic function was mildly reduced. - Pulmonary arteries: Systolic pressure was mild to moderately   elevated. PA peak pressure: 49 mm Hg (S).  Impressions:  - Rhythm is atrial fibrillation.  ASSESSMENT AND PLAN: Atiral fibrillation, rate controlled, Persistent Brilinta switch to Plavix. Cont Eliquis 5mg  bid.  Reassess rhythm on ffup.  CAD CAD s/p CABG x 4 in 1998 in [place], [place].  Last cardiac eval took place in 2005 with stress testing and cath, apparently revealing 4/4 patent grafts. S/P LHCath and DES to SVG to RCA 06/01/2016. Other grafts patent Cont ?blocker - she is very sensitive to this. She had tried bid dosing and developed 1st degree avb. She is tolerating current dose of Zebeta 2.5mg  qd.  She did not tolerate Tenormin and metoprolol in the past - lowered blood pressure too much. Cont acei.  Resume home dose of zetia (statin intolerant).  She is allergic to ASA  - significant GI side effects. Developed significant bruising to Plavix.  Brilinta stopped. Continue Plavix. Continue medical therapy. Patient may resume cardiac rehabilitation.  Hypertension  BP is well controlled. Continue monitoring BP. Continue current medical therapy and lifestyle  changes.   Hyperlipidemia Statin intolerant. Resume zetia. Patient developed significant muscle weakness and intolerance to rosuvastatin, atorvastatin, simvastatin, and pravastatin.   carotid artery disease  Pt followed by PCP.  Current medicines are reviewed at length with the patient today.  The patient does not have concerns regarding medicines.  Labs/ tests ordered today include:  Orders Placed This Encounter  Procedures  . EKG 12-Lead    I had a lengthy and detailed discussion with the patient regarding diagnoses, prognosis, diagnostic options, treatment options , and side effects of medications.   I counseled the patient on importance of lifestyle modification including heart healthy diet, regular physical activity .   Disposition:   FU with undersigned in 4 weeks orsooner  I spent at least 40 minutes with the patient today and more than 50% of the time was spent counseling the patient and coordinating care.     Signed, Wende Bushy, MD  10/11/2016 3:31 PM    [place]  This note was generated in part with voice recognition software and I apologize for any typographical errors that were not detected and corrected.

## 2016-10-11 NOTE — Patient Instructions (Signed)
Medication Instructions:  Your physician has recommended you make the following change in your medication:  1. START Lasix 10 mg Once daily  Increase your potassium intake such as bananas and other foods high in potassium.  Labwork: Your physician recommends that you return for lab work in: 1 week to have repeat BMP   Follow-Up: Your physician recommends that you schedule a follow-up appointment in: 4 weeks with Dr. Yvone Neu.   It was a pleasure seeing you today here in the office. Please do not hesitate to give [place] a call back if you have any further questions. [place], BSN

## 2016-10-11 NOTE — Telephone Encounter (Signed)
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 Declined appointment for now. FYI

## 2016-10-15 ENCOUNTER — Encounter: Payer: Medicare Other | Attending: Cardiovascular Disease

## 2016-10-15 ENCOUNTER — Telehealth: Payer: Self-pay | Admitting: *Deleted

## 2016-10-15 DIAGNOSIS — Z955 Presence of coronary angioplasty implant and graft: Secondary | ICD-10-CM | POA: Insufficient documentation

## 2016-10-15 NOTE — Telephone Encounter (Signed)
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 She is not feeling very well with cold like symptoms.  She was released to return from a Cardiac standpoint and hopes to be able to do so soon.

## 2016-10-18 ENCOUNTER — Encounter: Payer: Medicare Other | Admitting: *Deleted

## 2016-10-18 DIAGNOSIS — Z9861 Coronary angioplasty status: Secondary | ICD-10-CM

## 2016-10-18 DIAGNOSIS — Z955 Presence of coronary angioplasty implant and graft: Secondary | ICD-10-CM | POA: Diagnosis not present

## 2016-10-18 NOTE — Progress Notes (Signed)
Daily Session Note  Patient Details  Name: MONTGOMERY ROTHLISBERGER, Dr. MRN: [id number] Date of Birth: 04/08/1936 Referring Provider:   Flowsheet Row Cardiac Rehab from 06/21/2016 in [facility]  Referring Provider  Kathlyn Sacramento MD      Encounter Date: 10/18/2016  Check In:     Session Check In - 10/18/16 1630      Check-In   Location [facility]Cardiac [facility] Pul[facility] Rehab   Staff Present Nada Maclachlan, BA, ACSM CEP, Exercise Physiologist;Kelly Amedeo Plenty, BS, ACSM CEP, Exercise Physiologist;Patricia Surles RN BSN   Supervising physician immediately available to respond to emergencies See telemetry face sheet for immediately available ER MD   Medication changes reported     No   Fall or balance concerns reported    No   Warm-up and Cool-down Performed on first and last piece of equipment   Resistance Training Performed Yes     Pain Assessment   Currently in Pain? No/denies         Goals Met:  Proper associated with RPD/PD & O2 Sat Exercise tolerated well No report of cardiac concerns or symptoms  Goals Unmet:  Not Applicable  Comments:     Dr. Emily Filbert is Medical Director for [facility] and [facility].

## 2016-10-19 ENCOUNTER — Ambulatory Visit: Payer: Medicare Other | Attending: Family | Admitting: Family

## 2016-10-19 ENCOUNTER — Encounter: Payer: Self-pay | Admitting: Family

## 2016-10-19 VITALS — BP 134/75 | HR 76 | Resp 20 | Ht 63.0 in | Wt 187.0 lb

## 2016-10-19 DIAGNOSIS — M1990 Unspecified osteoarthritis, unspecified site: Secondary | ICD-10-CM | POA: Diagnosis not present

## 2016-10-19 DIAGNOSIS — Z7902 Long term (current) use of antithrombotics/antiplatelets: Secondary | ICD-10-CM | POA: Diagnosis not present

## 2016-10-19 DIAGNOSIS — Z86010 Personal history of colonic polyps: Secondary | ICD-10-CM | POA: Insufficient documentation

## 2016-10-19 DIAGNOSIS — I130 Hypertensive heart and chronic kidney disease with heart failure and stage 1 through stage 4 chronic kidney disease, or unspecified chronic kidney disease: Secondary | ICD-10-CM | POA: Diagnosis not present

## 2016-10-19 DIAGNOSIS — Z85820 Personal history of malignant melanoma of skin: Secondary | ICD-10-CM | POA: Insufficient documentation

## 2016-10-19 DIAGNOSIS — N189 Chronic kidney disease, unspecified: Secondary | ICD-10-CM | POA: Diagnosis not present

## 2016-10-19 DIAGNOSIS — I4819 Other persistent atrial fibrillation: Secondary | ICD-10-CM

## 2016-10-19 DIAGNOSIS — I509 Heart failure, unspecified: Secondary | ICD-10-CM | POA: Insufficient documentation

## 2016-10-19 DIAGNOSIS — I2510 Atherosclerotic heart disease of native coronary artery without angina pectoris: Secondary | ICD-10-CM | POA: Insufficient documentation

## 2016-10-19 DIAGNOSIS — M353 Polymyalgia rheumatica: Secondary | ICD-10-CM | POA: Insufficient documentation

## 2016-10-19 DIAGNOSIS — Z87891 Personal history of nicotine dependence: Secondary | ICD-10-CM | POA: Diagnosis not present

## 2016-10-19 DIAGNOSIS — Z9049 Acquired absence of other specified parts of digestive tract: Secondary | ICD-10-CM | POA: Diagnosis not present

## 2016-10-19 DIAGNOSIS — Z981 Arthrodesis status: Secondary | ICD-10-CM | POA: Insufficient documentation

## 2016-10-19 DIAGNOSIS — M4800 Spinal stenosis, site unspecified: Secondary | ICD-10-CM | POA: Insufficient documentation

## 2016-10-19 DIAGNOSIS — Z96642 Presence of left artificial hip joint: Secondary | ICD-10-CM | POA: Diagnosis not present

## 2016-10-19 DIAGNOSIS — Z8711 Personal history of peptic ulcer disease: Secondary | ICD-10-CM | POA: Diagnosis not present

## 2016-10-19 DIAGNOSIS — E785 Hyperlipidemia, unspecified: Secondary | ICD-10-CM | POA: Insufficient documentation

## 2016-10-19 DIAGNOSIS — I471 Supraventricular tachycardia: Secondary | ICD-10-CM | POA: Insufficient documentation

## 2016-10-19 DIAGNOSIS — F329 Major depressive disorder, single episode, unspecified: Secondary | ICD-10-CM | POA: Diagnosis not present

## 2016-10-19 DIAGNOSIS — K219 Gastro-esophageal reflux disease without esophagitis: Secondary | ICD-10-CM | POA: Diagnosis not present

## 2016-10-19 DIAGNOSIS — I7300 Raynaud's syndrome without gangrene: Secondary | ICD-10-CM | POA: Diagnosis not present

## 2016-10-19 DIAGNOSIS — E559 Vitamin D deficiency, unspecified: Secondary | ICD-10-CM | POA: Diagnosis not present

## 2016-10-19 DIAGNOSIS — Z951 Presence of aortocoronary bypass graft: Secondary | ICD-10-CM | POA: Diagnosis not present

## 2016-10-19 DIAGNOSIS — I4891 Unspecified atrial fibrillation: Secondary | ICD-10-CM | POA: Insufficient documentation

## 2016-10-19 DIAGNOSIS — I10 Essential (primary) hypertension: Secondary | ICD-10-CM

## 2016-10-19 DIAGNOSIS — Z96653 Presence of artificial knee joint, bilateral: Secondary | ICD-10-CM | POA: Insufficient documentation

## 2016-10-19 DIAGNOSIS — J45909 Unspecified asthma, uncomplicated: Secondary | ICD-10-CM | POA: Insufficient documentation

## 2016-10-19 DIAGNOSIS — I5032 Chronic diastolic (congestive) heart failure: Secondary | ICD-10-CM | POA: Insufficient documentation

## 2016-10-19 DIAGNOSIS — Z87440 Personal history of urinary (tract) infections: Secondary | ICD-10-CM | POA: Insufficient documentation

## 2016-10-19 LAB — BASIC METABOLIC PANEL
ANION GAP: 9 (ref 5–15)
BUN: 24 mg/dL — ABNORMAL HIGH (ref 6–20)
CO2: 23 mmol/L (ref 22–32)
Calcium: 9.7 mg/dL (ref 8.9–10.3)
Chloride: 103 mmol/L (ref 101–111)
Creatinine, Ser: 0.87 mg/dL (ref 0.44–1.00)
GLUCOSE: 115 mg/dL — AB (ref 65–99)
Potassium: 4.4 mmol/L (ref 3.5–5.1)
Sodium: 135 mmol/L (ref 135–145)

## 2016-10-19 NOTE — Patient Instructions (Signed)
Continue weighing daily and call for an overnight weight gain of > 2 pounds or a weekly weight gain of >5 pounds. 

## 2016-10-19 NOTE — Progress Notes (Signed)
Patient ID: Charlotte Wagner, Dr., female    DOB: 02/03/1936, 81 y.o.   MRN: [id number]  HPI  Charlotte Wagner is a 81 y/o female with a history of vitamin D deficiency, spinal stenosis, PUD, PSVT, melanoma, hyperlipidemia, GERD, depression, CKD, CAD with CABG 1998, asthma, arthritis, remote tobacco use and chronic heart failure.   Last echo was done 10/09/16 with an EF of 60-65% along with mild MR and mild/moderately elevated PA pressure of 49 mm Hg. This is essentially unchanged from previous echo done on 06/19/16. Had a cardiac catheterization done on 06/01/16 and recommended aggressive treatment using brilinta.   Was last admitted on 10/08/16 with HF and possible asthma exacerbation. Treated with IV diuretics. Cardiology consult obtained and discharged home after 2 days. Previous hospital admission was 06/06/16 due to shortness of breath and chest pain. Cardiology consult was done and she was discharged the next day.  She presents today for her initial visit with fatigue and shortness of breath with exertion. Chronic lower leg edema with the L>R. Already weighing herself daily and she says that her weight has been stable. Does have some weakness in her legs and she uses a rollator walker to walk.   Past Medical History:  Diagnosis Date  . Allergy   . Arthritis    s/p bilateral knees and left hip replacement  . Asthma   . CAD (coronary artery disease)    a. 12/1996 s/p CABG x4 ([place]);  b. 2005 Pt reports stress test & cath, which revealed patent grafts.  . Cancer ([place])    melanoma right arm  . Carotid arterial disease ([place])    a. 04/2015 Carotid U/S: <50% bilat ICA stenosis.  . Chronic kidney disease   . Colon polyps    H/O  . Depression   . GERD (gastroesophageal reflux disease)    h/o hiatal hernia  . Headache    migraines in past  . Heart murmur    a. 04/2011 Echo: EF 55-60%, bilat atrial enlargement, mild to mod TR.  [name] History of chicken pox   . History of hiatal hernia   . Hx of  migraines    rare now  . Hx: UTI (urinary tract infection)   . Hyperlipidemia    a. Statin intolerant -->on zetia.  . Hypertensive heart disease   . Lichen planus   . Melanoma ([place])   . Palpitations    a. rare PVC's and h/o SVT.  [name] PMR (polymyalgia rheumatica) (H[facility])    h/o in setting of crestor usage.  [name] PSVT (paroxysmal supraventricular tachycardia) ([place])   . PUD (peptic ulcer disease)    remote history  . Raynaud's phenomenon   . Spinal stenosis   . Urine incontinence    H/O  . Vitamin D deficiency    Past Surgical History:  Procedure Laterality Date  . ADENOIDECTOMY     age 70  . BACK SURGERY     L3-L5  . BILATERAL CARPAL TUNNEL RELEASE    . BREAST BIOPSY Right    bx x 3 neg  . BREAST SURGERY Right    biopsy x 3 (all benign)  . CARDIAC CATHETERIZATION N/A 06/01/2016   Procedure: LEFT HEART CATH AND CORS/GRAFTS ANGIOGRAPHY;  Surgeon: Wellington Hampshire, MD;  Location: [place];  Service: Cardiovascular;  Laterality: N/A;  . CARDIAC CATHETERIZATION N/A 06/01/2016   Procedure: Coronary Stent Intervention;  Surgeon: Wellington Hampshire, MD;  Location: [place] [facility];  Service: Cardiovascular;  Laterality: N/A;  . CATARACT EXTRACTION W/PHACO Right 01/04/2016   Procedure: CATARACT EXTRACTION PHACO AND INTRAOCULAR LENS PLACEMENT (IOC);  Surgeon: Leandrew Koyanagi, MD;  Location: [facility];  Service: Ophthalmology;  Laterality: Right;  MALYUGIN  . CATARACT EXTRACTION W/PHACO Left 01/25/2016   Procedure: CATARACT EXTRACTION PHACO AND INTRAOCULAR LENS PLACEMENT ([place]) left eye;  Surgeon: Leandrew Koyanagi, MD;  Location: [facility];  Service: Ophthalmology;  Laterality: Left;  MALYUGIN SHUGARCAINE  . CHOLECYSTECTOMY  90's  . COLONOSCOPY WITH PROPOFOL N/A 05/03/2015   Procedure: COLONOSCOPY WITH PROPOFOL;  Surgeon: Lollie Sails, MD;  Location: [facility] ENDOSCOPY;  Service: Endoscopy;  Laterality: N/A;  . CORONARY ARTERY BYPASS GRAFT  98   4 vessel  .  ESOPHAGOGASTRODUODENOSCOPY N/A 03/22/2015   Procedure: ESOPHAGOGASTRODUODENOSCOPY (EGD);  Surgeon: Lollie Sails, MD;  Location[facility] END[facility]COPY;  Service: Endoscopy;  Laterality: N/A;  . JOINT REPLACEMENT     BILATERAL KNEE REPLACEMENTS  . KNEE ARTHROSCOPY W/ OATS PROCEDURE     Lt knee (9/01), Rt knee (3/11), Lt hip (5/10)  . TOTAL HIP ARTHROPLASTY     Family History  Problem Relation Age of Onset  . Thyroid disease Mother     graves disease  . Heart disease Father     rheumatic heart  . Alcohol abuse Father   . Depression Father   . Lung cancer Sister   . Depression Daughter   . Thyroid disease Daughter     hashimoto  . Depression Son   . Stroke Maternal Grandmother   . Depression Maternal Grandmother   . Hypertension Maternal Grandfather   . Hypertension Paternal Grandfather   . Breast cancer Neg Hx    Social History  Substance Use Topics  . Smoking status: Former Research scientist (life sciences)  . Smokeless tobacco: Never Used     Comment: quit 44+ yrs ago  . Alcohol use No   Allergies  Allergen Reactions  . Beta Adrenergic Blockers Shortness Of Breath  . Albuterol     rigors  . Aspirin Other (See Comments)    Heartburn   . Penicillins Hives and Other (See Comments)    Has patient had a PCN reaction causing immediate rash, facial/tongue/throat swelling, SOB or lightheadedness with hypotension: No Has patient had a PCN reaction causing severe rash involving mucus membranes or skin necrosis: No Has patient had a PCN reaction that required hospitalization No Has patient had a PCN reaction occurring within the last 10 years: No If all of the above answers are "NO", then may proceed with Cephalosporin use.   . Statins Other (See Comments)    Muscle weakness  . Sulfa Antibiotics Rash    At mouth  . Tetracycline Rash  . Tetracyclines & Related Rash   Prior to Admission medications   Medication Sig Start Date End Date Taking? Authorizing Provider  acetaminophen (TYLENOL) 500 MG tablet  Take 1,000 mg by mouth 2 (two) times daily.    Yes Historical Provider, MD  amitriptyline (ELAVIL) 25 MG tablet Take 1-2 tablets (25-50 mg total) by mouth at bedtime. Patient taking differently: Take 25 mg by mouth at bedtime.  09/06/16  Yes [name], MD  apixaban (ELIQUIS) 5 MG TABS tablet Take 1 tablet (5 mg total) by mouth 2 (two) times daily. 10/10/16  Yes Dustin Flock, MD  b complex vitamins tablet Take 1 tablet by mouth daily.   Yes Historical Provider, MD  bisoprolol (ZEBETA) 5 MG tablet Take 1 tablet (5 mg total) by mouth at bedtime. Patient  taking differently: Take 2.5 mg by mouth at bedtime.  09/04/16  Yes Wende Bushy, MD  Cholecalciferol (VITAMIN D3) 2000 UNITS TABS Take 1 capsule by mouth daily.    Yes Historical Provider, MD  clopidogrel (PLAVIX) 75 MG tablet Take 1 tablet (75 mg total) by mouth daily. 10/10/16  Yes Dustin Flock, MD  Coenzyme Q10 (COQ-10) 100 MG CAPS Take 1 tablet by mouth daily.   Yes Historical Provider, MD  ezetimibe (ZETIA) 10 MG tablet take 1 tablet by mouth once daily 09/03/16  Yes [name], MD  Fluticasone-Salmeterol (ADVAIR) 250-50 MCG/DOSE AEPB Inhale 1 puff into the lungs 2 (two) times daily as needed.   Yes Historical Provider, MD  folic acid (FOLVITE) 1 MG tablet take 1 tablet by mouth once daily 06/12/16  Yes [name], MD  furosemide (LASIX) 20 MG tablet Take 0.5 tablets (10 mg total) by mouth daily. 10/11/16 01/09/17 Yes Wende Bushy, MD  hydrocortisone 2.5 % cream apply to VULVA twice a day AT 10AM AND 4 PM 02/06/16  Yes [name], MD  ipratropium (ATROVENT HFA) 17 MCG/ACT inhaler Inhale 1 puff into the lungs every 4 (four) hours as needed for wheezing.   Yes Historical Provider, MD  ketoconazole (NIZORAL) 2 % cream Apply 1 application topically daily. Patient taking differently: Apply 1 application topically daily as needed for irritation.  06/15/14  Yes [name], MD  loratadine (CLARITIN) 10 MG tablet Take 10 mg by mouth daily.    Yes Historical Provider, MD  meclizine (ANTIVERT) 25 MG tablet Take 25 mg by mouth 3 (three) times daily as needed for dizziness.   Yes Historical Provider, MD  miconazole (MICOTIN) 2 % cream Apply 1 application topically 2 (two) times daily as needed (irritation).   Yes Historical Provider, MD  Multiple Vitamins-Minerals (HAIR/SKIN/NAILS PO) Take 1 tablet by mouth daily.   Yes Historical Provider, MD  nitroGLYCERIN (NITROSTAT) 0.4 MG SL tablet Place 1 tablet (0.4 mg total) under the tongue every 5 (five) minutes as needed for chest pain. Place 1 tablet (0.4 mg total) under the tongue every 5 (five) minutes , 3 times as needed for chest pain. 06/07/16  Yes Nicholes Mango, MD  Omega-3 Fatty Acids (FISH OIL PO) Take 2,000 mg by mouth daily.   Yes Historical Provider, MD  pantoprazole (PROTONIX) 40 MG tablet take 1 tablet by mouth twice a day 08/13/16  Yes [name], MD  Polyethylene Glycol 3350 (MIRALAX PO) Take 8.5 g by mouth at bedtime.    Yes Historical Provider, MD  ramipril (ALTACE) 10 MG capsule take 1 capsule by mouth once daily 07/16/16  Yes [name], MD  spironolactone (ALDACTONE) 25 MG tablet take 1 tablet by mouth every morning 08/31/16  Yes [name], MD  sucralfate (CARAFATE) 1 g tablet Take 1 g by mouth 2 (two) times daily as needed (acid reflux).   Yes Historical Provider, MD  traMADol (ULTRAM) 50 MG tablet Take 1 tablet (50 mg total) by mouth daily as needed. Patient taking differently: Take 50 mg by mouth daily as needed for moderate pain.  08/16/15  Yes [name], MD  traZODone (DESYREL) 150 MG tablet take 1 and 2/3 tablets to 2 tablets by mouth at bedtime as directed 08/10/16  Yes [name], MD  verapamil (CALAN-SR) 180 MG CR tablet Take 1 tablet (180 mg total) by mouth at bedtime. Patient taking differently: Take 180 mg by mouth 2 (two) times daily.  10/10/16  Yes Dustin Flock, MD  vitamin B-12 (  CYANOCOBALAMIN) 1000 MCG tablet Take 1,000 mcg by mouth daily.   Yes  Historical Provider, MD  vitamin C (ASCORBIC ACID) 500 MG tablet Take 500 mg by mouth at bedtime.   Yes Historical Provider, MD     Review of Systems  Constitutional: Positive for fatigue. Negative for appetite change.  HENT: Positive for nosebleeds and rhinorrhea. Negative for congestion and sore throat.   Eyes: Negative.   Respiratory: Positive for cough and shortness of breath. Negative for chest tightness.   Cardiovascular: Positive for palpitations and leg swelling. Negative for chest pain.  Gastrointestinal: Negative for abdominal distention and abdominal pain.  Endocrine: Negative.   Genitourinary: Negative.   Musculoskeletal: Positive for arthralgias (bilateral hands due to arthritis). Negative for neck pain.  Skin: Negative.   Allergic/Immunologic: Negative.   Neurological: Positive for weakness (in legs). Negative for dizziness and light-headedness.  Hematological: Negative for adenopathy. Bruises/bleeds easily.  Psychiatric/Behavioral: Negative for dysphoric mood and suicidal ideas. The patient is not nervous/anxious.    Vitals:   10/19/16 1200  BP: 134/75  Pulse: 76  Resp: 20  SpO2: 97%  Weight: 187 lb (84.8 kg)  Height: 5\' 3"  (1.6 m)   Wt Readings from Last 3 Encounters:  10/19/16 187 lb (84.8 kg)  10/11/16 186 lb 12 oz (84.7 kg)  10/08/16 181 lb 1.6 oz (82.1 kg)   Lab Results  Component Value Date   CREATININE 0.87 10/19/2016   CREATININE 1.26 (H) 10/10/2016   CREATININE 1.23 (H) 10/09/2016    Physical Exam  Constitutional: She is oriented to person, place, and time. She appears well-developed and well-nourished.  HENT:  Head: Normocephalic and atraumatic.  Eyes: Conjunctivae are normal. Pupils are equal, round, and reactive to light.  Neck: Normal range of motion. Neck supple. No JVD present.  Cardiovascular: Normal rate.  An irregular rhythm present.  Pulmonary/Chest: Effort normal. She has no wheezes. She has no rales.  Abdominal: Soft. She exhibits  no distension. There is no tenderness.  Musculoskeletal: She exhibits edema (1+ pitting in bilateral lower legs with L>R). She exhibits no tenderness.  Neurological: She is alert and oriented to person, place, and time.  Skin: Skin is warm and dry.  Psychiatric: She has a normal mood and affect. Her behavior is normal. Thought content normal.  Nursing note and vitals reviewed.  Assessment & Plan:  1: Chronic heart failure with preserved ejection fraction- - NYHA class II - euvolemic - already weighing daily. Already has instructions to take additional diuretic for an overnight weight gain of >2 pounds or a weekly weight gain of >5 pounds - not adding salt and is already reading food labels. Importance of closely following a 2000mg  sodium diet was reviewed with her - says that she's walking about 1/4 mile daily with her rollator - has already received her flu, pneumonia and shingles vaccines - BMP drawn today to evaluate renal function. Patient was called with the results prior to this note being finished  2: HTN- - BP looks good today - follows with her PCP Nicki Reaper) regarding this  3: Atrial fibrillation- - currently rate controlled at this time - taking apixaban, bisoprolol, clopidogrel and verapamil - follows with cardiology Yvone Neu) regarding this and returns to her on 11/08/16   Patient opts to return on an as needed basis.

## 2016-10-22 ENCOUNTER — Encounter: Payer: Medicare Other | Admitting: *Deleted

## 2016-10-22 DIAGNOSIS — Z9861 Coronary angioplasty status: Secondary | ICD-10-CM

## 2016-10-22 DIAGNOSIS — Z955 Presence of coronary angioplasty implant and graft: Secondary | ICD-10-CM | POA: Diagnosis not present

## 2016-10-22 NOTE — Progress Notes (Signed)
Daily Session Note  Patient Details  Name: SURIE SUCHOCKI, Dr. MRN: [id number] Date of Birth: [birth year] Referring Provider:   [facility] Cardiac Rehab from 06/21/2016 in [facility] Cardiac and Pulmonary Rehab  Referring Provider  Kathlyn Sacramento MD      Encounter Date: 10/22/2016  Check In:     Session Check In - 10/22/16 1647      Check-In   Location [facility]-Cardiac & Pulmonary Rehab   Staff Present Heath Lark, RN, BSN, Laveda Norman, BS, ACSM CEP, Exercise Physiologist;Carroll Enterkin, RN, BSN   Supervising physician immediately available to respond to emergencies See telemetry face sheet for immediately available ER MD   Medication changes reported     No   Fall or balance concerns reported    No   Warm-up and Cool-down Performed on first and last piece of equipment   Resistance Training Performed Yes   VAD Patient? No     Pain Assessment   Currently in Pain? No/denies   Multiple Pain Sites No         Goals Met:  Independence with exercise equipment Exercise tolerated well No report of cardiac concerns or symptoms Strength training completed today  Goals Unmet:  Not Applicable  Comments: Pt able to follow exercise prescription today without complaint.  Will continue to monitor for progression.    Dr. Emily Filbert is Medical Director for [place] and [facility].

## 2016-10-29 ENCOUNTER — Encounter: Payer: Medicare Other | Admitting: *Deleted

## 2016-10-29 DIAGNOSIS — Z9861 Coronary angioplasty status: Secondary | ICD-10-CM

## 2016-10-29 DIAGNOSIS — Z955 Presence of coronary angioplasty implant and graft: Secondary | ICD-10-CM | POA: Diagnosis not present

## 2016-10-29 NOTE — Progress Notes (Signed)
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: [id number] Date of Birth: [birth year] Referring Provider:   Flowsheet Row Cardiac Rehab from 06/21/2016 in [facility] Re[facility]  Referring Provider  Kathlyn Sacramento MD      Encounter Date: 10/29/2016  Check In:     Session Check In - 10/29/16 1814      Check-In   Staff Present Heath Lark, RN, BSN, CCRP;Carroll Enterkin, RN, Moises Blood, BS, ACSM CEP, Exercise Physiologist   Supervising physician immediately available to respond to emergencies See telemetry face sheet for immediately available ER MD   Medication changes reported     No   Fall or balance concerns reported    No   Warm-up and Cool-down Performed on first and last piece of equipment   Resistance Training Performed Yes   VAD Patient? No     Pain Assessment   Currently in Pain? No/denies         Goals Met:  Independence with exercise equipment Exercise tolerated well No report of cardiac concerns or symptoms Strength training completed today  Goals Unmet:  Not Applicable  Comments: Doing well with exercise prescription progression.     Dr. Emily Filbert is Medical Director for [place] and [facility].

## 2016-11-01 DIAGNOSIS — Z9861 Coronary angioplasty status: Secondary | ICD-10-CM

## 2016-11-01 DIAGNOSIS — Z955 Presence of coronary angioplasty implant and graft: Secondary | ICD-10-CM | POA: Diagnosis not present

## 2016-11-01 NOTE — Progress Notes (Signed)
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: [id number] Date of Birth: Jan 24, 1936 Referring Provider:   [facility] Cardiac Rehab from 06/21/2016 in [facility] Cardiac and Pulmonary Rehab  Referring Provider  Kathlyn Sacramento MD      Encounter Date: 11/01/2016  Check In:     Session Check In - 11/01/16 1721      Check-In   Location [facility]-Cardiac & Pulmonary Rehab   Staff Present Levell July RN Moises Blood, BS, ACSM CEP, Exercise Physiologist;Akasia Oletta Darter, [place], ACSM CEP, Exercise Physiologist   Supervising physician immediately available to respond to emergencies See telemetry face sheet for immediately available ER MD   Medication changes reported     No   Fall or balance concerns reported    No   Warm-up and Cool-down Performed on first and last piece of equipment   Resistance Training Performed Yes   VAD Patient? No     Pain Assessment   Currently in Pain? No/denies           Exercise Prescription Changes - 11/01/16 1200      Response to Exercise   Blood Pressure (Exercise) 132/70   Blood Pressure (Exit) 110/70   Heart Rate (Admit) 84 bpm   Heart Rate (Exercise) 101 bpm   Heart Rate (Exit) 87 bpm   Rating of Perceived Exertion (Exercise) 12   Symptoms none   Duration Progress to 50 minutes of aerobic without signs/symptoms of physical distress   Intensity THRR unchanged     Progression   Progression Continue to progress workloads to maintain intensity without signs/symptoms of physical distress.     Resistance Training   Training Prescription Yes   Weight 3   Reps 10-12     Treadmill   MPH 2   Grade 0   Minutes 15   METs 2.53     Biostep-RELP   Level 3   Minutes 15   METs 2      Goals Met:  Independence with exercise equipment Exercise tolerated well No report of cardiac concerns or symptoms Strength training completed today  Goals Unmet:  Not Applicable  Comments: Pt able to follow exercise prescription today  without complaint.  Will continue to monitor for progression.    Dr. Emily Filbert is Medical Director for [place] and [facility].

## 2016-11-05 ENCOUNTER — Encounter: Payer: Medicare Other | Admitting: *Deleted

## 2016-11-05 ENCOUNTER — Telehealth: Payer: Self-pay | Admitting: Cardiology

## 2016-11-05 DIAGNOSIS — Z955 Presence of coronary angioplasty implant and graft: Secondary | ICD-10-CM | POA: Diagnosis not present

## 2016-11-05 DIAGNOSIS — Z9861 Coronary angioplasty status: Secondary | ICD-10-CM

## 2016-11-05 MED ORDER — CLOPIDOGREL BISULFATE 75 MG PO TABS: 75.0000 mg | ORAL_TABLET | Freq: Every day | ORAL | 0 refills | Status: DC

## 2016-11-05 MED ORDER — APIXABAN 5 MG PO TABS: 5.0000 mg | ORAL_TABLET | Freq: Two times a day (BID) | ORAL | 0 refills | Status: DC

## 2016-11-05 NOTE — Telephone Encounter (Signed)
Patient requesting refill on Plavix and Eliquis, Dr. Farrel Conners not original prescriber please advise if okay to refill.

## 2016-11-05 NOTE — Telephone Encounter (Signed)
Prescriptions entered for 1 month supply at this time.

## 2016-11-05 NOTE — Telephone Encounter (Signed)
°*  STAT* If patient is at the pharmacy, call can be transferred to refill team.   1. Which medications need to be refilled? (please list name of each medication and dose if known)  Plavix and Eliquis   2. Which pharmacy/location (including street and city if local pharmacy) is medication to be sent to? [facility]   3. Do they need a 30 day or 90 day supply? 90 day

## 2016-11-05 NOTE — Progress Notes (Signed)
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: [id number] Date of Birth: [birth year] Referring Provider:   Flowsheet Row Cardiac Rehab from 06/21/2016 in [facility] Cardiac and Pulmonary Rehab  Referring Provider  Kathlyn Sacramento MD      Encounter Date: 11/05/2016  Check In:     Session Check In - 11/05/16 1637      Check-In   Staff Present Heath Lark, RN, BSN, Laveda Norman, BS, ACSM CEP, Exercise Physiologist;Carroll Enterkin, RN, BSN   Supervising physician immediately available to respond to emergencies See telemetry face sheet for immediately available ER MD   Medication changes reported     No   Fall or balance concerns reported    No   Warm-up and Cool-down Performed on first and last piece of equipment   Resistance Training Performed Yes   VAD Patient? No     Pain Assessment   Currently in Pain? No/denies         Goals Met:  Exercise tolerated well No report of cardiac concerns or symptoms Strength training completed today  Goals Unmet:  Not Applicable  Comments: Doing well with exercise prescription progression.    Dr. Emily Filbert is Medical Director for [place] and [facility].

## 2016-11-06 ENCOUNTER — Telehealth: Payer: Self-pay | Admitting: *Deleted

## 2016-11-06 ENCOUNTER — Ambulatory Visit (INDEPENDENT_AMBULATORY_CARE_PROVIDER_SITE_OTHER): Payer: Medicare Other | Admitting: Cardiology

## 2016-11-06 ENCOUNTER — Encounter: Payer: Self-pay | Admitting: Cardiology

## 2016-11-06 VITALS — BP 100/62 | HR 85 | Ht 63.0 in | Wt 187.2 lb

## 2016-11-06 DIAGNOSIS — I10 Essential (primary) hypertension: Secondary | ICD-10-CM

## 2016-11-06 DIAGNOSIS — I481 Persistent atrial fibrillation: Secondary | ICD-10-CM | POA: Diagnosis not present

## 2016-11-06 DIAGNOSIS — Z951 Presence of aortocoronary bypass graft: Secondary | ICD-10-CM

## 2016-11-06 DIAGNOSIS — Z9861 Coronary angioplasty status: Secondary | ICD-10-CM

## 2016-11-06 DIAGNOSIS — I4819 Other persistent atrial fibrillation: Secondary | ICD-10-CM

## 2016-11-06 DIAGNOSIS — I2510 Atherosclerotic heart disease of native coronary artery without angina pectoris: Secondary | ICD-10-CM | POA: Diagnosis not present

## 2016-11-06 NOTE — Patient Instructions (Addendum)
Medication Instructions:  Your physician has recommended you make the following change in your medication:  1. Discontinue Lasix and monitor daily weights.  Your physician recommends that you weigh, daily, at the same time every day, and in the same amount of clothing. Please record your daily weights on the handout provided and bring it to your next appointment.   Your physician has requested that you regularly monitor and record your blood pressure readings at home. Please use the same machine at the same time of day to check your readings and record them to bring to your follow-up visit.    Follow-Up: Your physician recommends that you schedule a follow-up appointment in: Mid March with Dr. Yvone Neu.   It was a pleasure seeing you today here in the office. Please do not hesitate to give [place] a call back if you have any further questions. [place], BSN

## 2016-11-06 NOTE — Telephone Encounter (Signed)
Spoke with patient and advised her of Dr. Tora Kindred recommendations to continue plavix until 05/2017. She was appreciative for the call and verbalized understanding and had no further questions at this time.

## 2016-11-06 NOTE — Progress Notes (Deleted)
Patient ID: Charlotte Wagner, Dr., female    DOB: [birth year], 81 y.o.   MRN: [id number]  HPI  Charlotte Wagner is a 81 y/o female with a history of vitamin D deficiency, spinal stenosis, PUD, PSVT, melanoma, hyperlipidemia, GERD, depression, CKD, CAD with CABG 1998, asthma, arthritis, remote tobacco use and chronic heart failure.   Last echo was done 10/09/16 with an EF of 60-65% along with mild MR and mild/moderately elevated PA pressure of 49 mm Hg. This is essentially unchanged from previous echo done on 06/19/16. Had a cardiac catheterization done on 06/01/16 and recommended aggressive treatment using brilinta.   Was last admitted on 10/08/16 with HF and possible asthma exacerbation. Treated with IV diuretics. Cardiology consult obtained and discharged home after 2 days. Previous hospital admission was 06/06/16 due to shortness of breath and chest pain. Cardiology consult was done and she was discharged the next day.  She presents today for her initial visit with fatigue and shortness of breath with exertion. Chronic lower leg edema with the L>R. Already weighing herself daily and she says that her weight has been stable. Does have some weakness in her legs and she uses a rollator walker to walk.   Past Medical History:  Diagnosis Date  . Allergy   . Arthritis    s/p bilateral knees and left hip replacement  . Asthma   . CAD (coronary artery disease)    a. 12/1996 s/p CABG x4 ([place]);  b. 2005 Pt reports stress test & cath, which revealed patent grafts.  . Cancer ([place])    melanoma right arm  . Carotid arterial disease ([place])    a. 04/2015 Carotid U/S: <50% bilat ICA stenosis.  . Chronic kidney disease   . Colon polyps    H/O  . Depression   . GERD (gastroesophageal reflux disease)    h/o hiatal hernia  . Headache    migraines in past  . Heart murmur    a. 04/2011 Echo: EF 55-60%, bilat atrial enlargement, mild to mod TR.  [name] History of chicken pox   . History of hiatal hernia   . Hx of  migraines    rare now  . Hx: UTI (urinary tract infection)   . Hyperlipidemia    a. Statin intolerant -->on zetia.  . Hypertensive heart disease   . Lichen planus   . Melanoma ([place])   . Palpitations    a. rare PVC's and h/o SVT.  [name] PMR (polymyalgia rheumatica) ([facility])    h/o in setting of crestor usage.  [name] PSVT (paroxysmal supraventricular tachycardia) ([place])   . PUD (peptic ulcer disease)    remote history  . Raynaud's phenomenon   . Spinal stenosis   . Urine incontinence    H/O  . Vitamin D deficiency    Past Surgical History:  Procedure Laterality Date  . ADENOIDECTOMY     age 21  . BACK SURGERY     L3-L5  . BILATERAL CARPAL TUNNEL RELEASE    . BREAST BIOPSY Right    bx x 3 neg  . BREAST SURGERY Right    biopsy x 3 (all benign)  . CARDIAC CATHETERIZATION N/A 06/01/2016   Procedure: LEFT HEART CATH AND CORS/GRAFTS ANGIOGRAPHY;  Surgeon: Wellington Hampshire, MD;  Location: [facility];  Service: Cardiovascular;  Laterality: N/A;  . CARDIAC CATHETERIZATION N/A 06/01/2016   Procedure: Coronary Stent Intervention;  Surgeon: Wellington Hampshire, MD;  Location: [facility];  Service: Cardiovascular;  Laterality: N/A;  . CATARACT EXTRACTION W/PHACO Right 01/04/2016   Procedure: CATARACT EXTRACTION PHACO AND INTRAOCULAR LENS PLACEMENT (IOC);  Surgeon: Leandrew Koyanagi, MD;  Location: [facility];  Service: Ophthalmology;  Laterality: Right;  MALYUGIN  . CATARACT EXTRACTION W/PHACO Left 01/25/2016   Procedure: CATARACT EXTRACTION PHACO AND INTRAOCULAR LENS PLACEMENT ([place]) left eye;  Surgeon: Leandrew Koyanagi, MD;  Location: [facility];  Service: Ophthalmology;  Laterality: Left;  MALYUGIN SHUGARCAINE  . CHOLECYSTECTOMY  90's  . COLONOSCOPY WITH PROPOFOL N/A 05/03/2015   Procedure: COLONOSCOPY WITH PROPOFOL;  Surgeon: Lollie Sails, MD;  Location: [facility];  Service: Endoscopy;  Laterality: N/A;  . CORONARY ARTERY BYPASS GRAFT  98   4 vessel  .  ESOPHAGOGASTRODUODENOSCOPY N/A 03/22/2015   Procedure: ESOPHAGOGASTRODUODENOSCOPY (EGD);  Surgeon: Lollie Sails, MD;  Location: [facility];  Service: Endoscopy;  Laterality: N/A;  . JOINT REPLACEMENT     BILATERAL KNEE REPLACEMENTS  . KNEE ARTHROSCOPY W/ OATS PROCEDURE     Lt knee (9/01), Rt knee (3/11), Lt hip (5/10)  . TOTAL HIP ARTHROPLASTY     Family History  Problem Relation Age of Onset  . Thyroid disease Mother     graves disease  . Heart disease Father     rheumatic heart  . Alcohol abuse Father   . Depression Father   . Lung cancer Sister   . Depression Daughter   . Thyroid disease Daughter     hashimoto  . Depression Son   . Stroke Maternal Grandmother   . Depression Maternal Grandmother   . Hypertension Maternal Grandfather   . Hypertension Paternal Grandfather   . Breast cancer Neg Hx    Social History  Substance Use Topics  . Smoking status: Former Research scientist (life sciences)  . Smokeless tobacco: Never Used     Comment: quit 44+ yrs ago  . Alcohol use No   Allergies  Allergen Reactions  . Beta Adrenergic Blockers Shortness Of Breath  . Albuterol     rigors  . Aspirin Other (See Comments)    Heartburn   . Penicillins Hives and Other (See Comments)    Has patient had a PCN reaction causing immediate rash, facial/tongue/throat swelling, SOB or lightheadedness with hypotension: No Has patient had a PCN reaction causing severe rash involving mucus membranes or skin necrosis: No Has patient had a PCN reaction that required hospitalization No Has patient had a PCN reaction occurring within the last 10 years: No If all of the above answers are "NO", then may proceed with Cephalosporin use.   . Statins Other (See Comments)    Muscle weakness  . Sulfa Antibiotics Rash    At mouth  . Tetracycline Rash  . Tetracyclines & Related Rash   Prior to Admission medications   Medication Sig Start Date End Date Taking? Authorizing Provider  acetaminophen (TYLENOL) 500 MG tablet  Take 1,000 mg by mouth 2 (two) times daily.    Yes Historical Provider, MD  amitriptyline (ELAVIL) 25 MG tablet Take 1-2 tablets (25-50 mg total) by mouth at bedtime. Patient taking differently: Take 25 mg by mouth at bedtime.  09/06/16  Yes [name], MD  apixaban (ELIQUIS) 5 MG TABS tablet Take 1 tablet (5 mg total) by mouth 2 (two) times daily. 10/10/16  Yes Dustin Flock, MD  b complex vitamins tablet Take 1 tablet by mouth daily.   Yes Historical Provider, MD  bisoprolol (ZEBETA) 5 MG tablet Take 1 tablet (5 mg total) by mouth at bedtime. Patient  taking differently: Take 2.5 mg by mouth at bedtime.  09/04/16  Yes Wende Bushy, MD  Cholecalciferol (VITAMIN D3) 2000 UNITS TABS Take 1 capsule by mouth daily.    Yes Historical Provider, MD  clopidogrel (PLAVIX) 75 MG tablet Take 1 tablet (75 mg total) by mouth daily. 10/10/16  Yes Dustin Flock, MD  Coenzyme Q10 (COQ-10) 100 MG CAPS Take 1 tablet by mouth daily.   Yes Historical Provider, MD  ezetimibe (ZETIA) 10 MG tablet take 1 tablet by mouth once daily 09/03/16  Yes [name], MD  Fluticasone-Salmeterol (ADVAIR) 250-50 MCG/DOSE AEPB Inhale 1 puff into the lungs 2 (two) times daily as needed.   Yes Historical Provider, MD  folic acid (FOLVITE) 1 MG tablet take 1 tablet by mouth once daily 06/12/16  Yes [name], MD  furosemide (LASIX) 20 MG tablet Take 0.5 tablets (10 mg total) by mouth daily. 10/11/16 01/09/17 Yes Wende Bushy, MD  hydrocortisone 2.5 % cream apply to VULVA twice a day AT 10AM AND 4 PM 02/06/16  Yes [name], MD  ipratropium (ATROVENT HFA) 17 MCG/ACT inhaler Inhale 1 puff into the lungs every 4 (four) hours as needed for wheezing.   Yes Historical Provider, MD  ketoconazole (NIZORAL) 2 % cream Apply 1 application topically daily. Patient taking differently: Apply 1 application topically daily as needed for irritation.  06/15/14  Yes [name], MD  loratadine (CLARITIN) 10 MG tablet Take 10 mg by mouth daily.    Yes Historical Provider, MD  meclizine (ANTIVERT) 25 MG tablet Take 25 mg by mouth 3 (three) times daily as needed for dizziness.   Yes Historical Provider, MD  miconazole (MICOTIN) 2 % cream Apply 1 application topically 2 (two) times daily as needed (irritation).   Yes Historical Provider, MD  Multiple Vitamins-Minerals (HAIR/SKIN/NAILS PO) Take 1 tablet by mouth daily.   Yes Historical Provider, MD  nitroGLYCERIN (NITROSTAT) 0.4 MG SL tablet Place 1 tablet (0.4 mg total) under the tongue every 5 (five) minutes as needed for chest pain. Place 1 tablet (0.4 mg total) under the tongue every 5 (five) minutes , 3 times as needed for chest pain. 06/07/16  Yes Nicholes Mango, MD  Omega-3 Fatty Acids (FISH OIL PO) Take 2,000 mg by mouth daily.   Yes Historical Provider, MD  pantoprazole (PROTONIX) 40 MG tablet take 1 tablet by mouth twice a day 08/13/16  Yes [name], MD  Polyethylene Glycol 3350 (MIRALAX PO) Take 8.5 g by mouth at bedtime.    Yes Historical Provider, MD  ramipril (ALTACE) 10 MG capsule take 1 capsule by mouth once daily 07/16/16  Yes [name], MD  spironolactone (ALDACTONE) 25 MG tablet take 1 tablet by mouth every morning 08/31/16  Yes [name], MD  sucralfate (CARAFATE) 1 g tablet Take 1 g by mouth 2 (two) times daily as needed (acid reflux).   Yes Historical Provider, MD  traMADol (ULTRAM) 50 MG tablet Take 1 tablet (50 mg total) by mouth daily as needed. Patient taking differently: Take 50 mg by mouth daily as needed for moderate pain.  08/16/15  Yes [name], MD  traZODone (DESYREL) 150 MG tablet take 1 and 2/3 tablets to 2 tablets by mouth at bedtime as directed 08/10/16  Yes [name], MD  verapamil (CALAN-SR) 180 MG CR tablet Take 1 tablet (180 mg total) by mouth at bedtime. Patient taking differently: Take 180 mg by mouth 2 (two) times daily.  10/10/16  Yes Dustin Flock, MD  vitamin B-12 (  CYANOCOBALAMIN) 1000 MCG tablet Take 1,000 mcg by mouth daily.   Yes  Historical Provider, MD  vitamin C (ASCORBIC ACID) 500 MG tablet Take 500 mg by mouth at bedtime.   Yes Historical Provider, MD     Review of Systems  Constitutional: Positive for fatigue. Negative for appetite change.  HENT: Positive for nosebleeds and rhinorrhea. Negative for congestion and sore throat.   Eyes: Negative.   Respiratory: Positive for cough and shortness of breath. Negative for chest tightness.   Cardiovascular: Positive for palpitations and leg swelling. Negative for chest pain.  Gastrointestinal: Negative for abdominal distention and abdominal pain.  Endocrine: Negative.   Genitourinary: Negative.   Musculoskeletal: Positive for arthralgias (bilateral hands due to arthritis). Negative for neck pain.  Skin: Negative.   Allergic/Immunologic: Negative.   Neurological: Positive for weakness (in legs). Negative for dizziness and light-headedness.  Hematological: Negative for adenopathy. Bruises/bleeds easily.  Psychiatric/Behavioral: Negative for dysphoric mood and suicidal ideas. The patient is not nervous/anxious.    Vitals:   11/06/16 1130  BP: 100/62  Pulse: 85  Weight: 187 lb 4 oz (84.9 kg)  Height: 5\' 3"  (1.6 m)   Wt Readings from Last 3 Encounters:  11/06/16 187 lb 4 oz (84.9 kg)  10/19/16 187 lb (84.8 kg)  10/11/16 186 lb 12 oz (84.7 kg)   Lab Results  Component Value Date   CREATININE 0.87 10/19/2016   CREATININE 1.26 (H) 10/10/2016   CREATININE 1.23 (H) 10/09/2016    Physical Exam  Constitutional: She is oriented to person, place, and time. She appears well-developed and well-nourished.  HENT:  Head: Normocephalic and atraumatic.  Eyes: Conjunctivae are normal. Pupils are equal, round, and reactive to light.  Neck: Normal range of motion. Neck supple. No JVD present.  Cardiovascular: Normal rate.  An irregular rhythm present.  Pulmonary/Chest: Effort normal. She has no wheezes. She has no rales.  Abdominal: Soft. She exhibits no distension. There  is no tenderness.  Musculoskeletal: She exhibits edema (1+ pitting in bilateral lower legs with L>R). She exhibits no tenderness.  Neurological: She is alert and oriented to person, place, and time.  Skin: Skin is warm and dry.  Psychiatric: She has a normal mood and affect. Her behavior is normal. Thought content normal.  Nursing note and vitals reviewed.

## 2016-11-06 NOTE — Progress Notes (Signed)
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 other    3 week follow up patient c/o SOB. Meds reviewed verbally with patient.     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 She has significant bruising from being on Eliquis and Plavix. She has not seen significant swelling. Her weight has remained stable.  Patient denies chest pain, lightheadedness, syncope, PND, orthopnea, edema.  ROS:  Please see the history of present illness. Aside from mentioned under HPI, all other systems are reviewed and negative.    Past Medical History:  Diagnosis Date  . Allergy   . Arthritis    s/p bilateral knees and left hip replacement  . Asthma   . CAD (coronary artery disease)    a. 12/1996 s/p CABG x4 ([place]);  b. 2005 Pt reports stress test & cath, which revealed patent grafts.  . Cancer ([place])    melanoma right arm  . Carotid arterial disease ([place])    a. 04/2015 Carotid U/S: <50% bilat ICA stenosis.  . Chronic kidney disease   . Colon polyps    H/O  . Depression   . GERD (gastroesophageal reflux disease)    h/o hiatal hernia  . Headache    migraines in past  . Heart murmur    a. 04/2011 Echo: EF 55-60%, bilat atrial enlargement, mild to mod TR.  [name] History of chicken pox   . History of hiatal hernia   . Hx of migraines    rare now  . Hx: UTI (urinary tract infection)   . Hyperlipidemia    a. Statin intolerant -->on zetia.  . Hypertensive heart disease   . Lichen planus   . Melanoma ([place])   . Palpitations    a. rare PVC's and h/o SVT.  [name] PMR (polymyalgia rheumatica) ([facility])    h/o in setting of crestor usage.  [name] PSVT (paroxysmal supraventricular  tachycardia) ([place])   . PUD (peptic ulcer disease)    remote history  . Raynaud's phenomenon   . Spinal stenosis   . Urine incontinence    H/O  . Vitamin D deficiency     Past Surgical History:  Procedure Laterality Date  . ADENOIDECTOMY     age 35  . BACK SURGERY     L3-L5  . BILATERAL CARPAL TUNNEL RELEASE    . BREAST BIOPSY Right    bx x 3 neg  . BREAST SURGERY Right    biopsy x 3 (all benign)  . CARDIAC CATHETERIZATION N/A 06/01/2016   Procedure: LEFT HEART CATH AND CORS/GRAFTS ANGIOGRAPHY;  Surgeon: Wellington Hampshire, MD;  Location: [facility];  Service: Cardiovascular;  Laterality: N/A;  . CARDIAC CATHETERIZATION N/A 06/01/2016   Procedure: Coronary Stent Intervention;  Surgeon: Wellington Hampshire, MD;  Location: [facility];  Service: Cardiovascular;  Laterality: N/A;  . CATARACT EXTRACTION W/PHACO Right 01/04/2016   Procedure: CATARACT EXTRACTION PHACO AND INTRAOCULAR LENS PLACEMENT (IOC);  Surgeon: Leandrew Koyanagi, MD;  Location: [facility];  Service: Ophthalmology;  Laterality: Right;  MALYUGIN  . CATARACT EXTRACTION W/PHACO Left 01/25/2016   Procedure: CATARACT EXTRACTION PHACO AND  INTRAOCULAR LENS PLACEMENT (IOC) left eye;  Surgeon: Leandrew Koyanagi, MD;  Location: [facility];  Service: Ophthalmology;  Laterality: Left;  MALYUGIN SHUGARCAINE  . CHOLECYSTECTOMY  90's  . COLONOSCOPY WITH PROPOFOL N/A 05/03/2015   Procedure: COLONOSCOPY WITH PROPOFOL;  Surgeon: Lollie Sails, MD;  [facility];  Service: Endoscopy;  Laterality: N/A;  . CORONARY ARTERY BYPASS GRAFT  98   4 vessel  . ESOPHAGOGASTRODUODENOSCOPY N/A 03/22/2015   Procedure: ESOPHAGOGASTRODUODENOSCOPY (EGD);  Surgeon: Lollie Sails, MD;  Location: [facility] ENDOSCOPY;  Service: Endoscopy;  Laterality: N/A;  . JOINT REPLACEMENT     BILATERAL KNEE REPLACEMENTS  . KNEE ARTHROSCOPY W/ OATS PROCEDURE     Lt knee (9/01), Rt knee (3/11), Lt hip (5/10)  . TOTAL HIP  ARTHROPLASTY       reports that she has quit smoking. She has never used smokeless tobacco. She reports that she does not drink alcohol or use drugs.   family history includes Alcohol abuse in her father; Depression in her daughter, father, maternal grandmother, and son; Heart disease in her father; Hypertension in her maternal grandfather and paternal grandfather; Lung cancer in her sister; Stroke in her maternal grandmother; Thyroid disease in her daughter and mother.   Outpatient Medications Prior to Visit  Medication Sig Dispense Refill  . acetaminophen (TYLENOL) 500 MG tablet Take 1,000 mg by mouth 2 (two) times daily.     [name] amitriptyline (ELAVIL) 25 MG tablet Take 1-2 tablets (25-50 mg total) by mouth at bedtime. (Patient taking differently: Take 25 mg by mouth at bedtime. ) 60 tablet 5  . apixaban (ELIQUIS) 5 MG TABS tablet Take 1 tablet (5 mg total) by mouth 2 (two) times daily. 60 tablet 0  . b complex vitamins tablet Take 1 tablet by mouth daily.    . bisoprolol (ZEBETA) 5 MG tablet Take 1 tablet (5 mg total) by mouth at bedtime. (Patient taking differently: Take 2.5 mg by mouth at bedtime. ) 30 tablet 3  . Cholecalciferol (VITAMIN D3) 2000 UNITS TABS Take 1 capsule by mouth daily.     . clopidogrel (PLAVIX) 75 MG tablet Take 1 tablet (75 mg total) by mouth daily. 30 tablet 0  . Coenzyme Q10 (COQ-10) 100 MG CAPS Take 1 tablet by mouth daily.    [name] ezetimibe (ZETIA) 10 MG tablet take 1 tablet by mouth once daily 30 tablet 7  . Fluticasone-Salmeterol (ADVAIR) 250-50 MCG/DOSE AEPB Inhale 1 puff into the lungs 2 (two) times daily as needed.    . folic acid (FOLVITE) 1 MG tablet take 1 tablet by mouth once daily 30 tablet 5  . furosemide (LASIX) 20 MG tablet Take 0.5 tablets (10 mg total) by mouth daily. 15 tablet 3  . hydrocortisone 2.5 % cream apply to VULVA twice a day AT 10AM AND 4 PM 28 g 0  . ipratropium (ATROVENT HFA) 17 MCG/ACT inhaler Inhale 1 puff into the lungs every 4 (four)  hours as needed for wheezing.    [name] ketoconazole (NIZORAL) 2 % cream Apply 1 application topically daily. (Patient taking differently: Apply 1 application topically daily as needed for irritation. ) 30 g 0  . loratadine (CLARITIN) 10 MG tablet Take 10 mg by mouth daily.    . meclizine (ANTIVERT) 25 MG tablet Take 25 mg by mouth 3 (three) times daily as needed for dizziness.    . miconazole (MICOTIN) 2 % cream Apply 1 application topically 2 (two) times daily as needed (irritation).    [name]  Multiple Vitamins-Minerals (HAIR/SKIN/NAILS PO) Take 1 tablet by mouth daily.    . nitroGLYCERIN (NITROSTAT) 0.4 MG SL tablet Place 1 tablet (0.4 mg total) under the tongue every 5 (five) minutes as needed for chest pain. Place 1 tablet (0.4 mg total) under the tongue every 5 (five) minutes , 3 times as needed for chest pain. 15 tablet 0  . Omega-3 Fatty Acids (FISH OIL PO) Take 2,000 mg by mouth daily.    . pantoprazole (PROTONIX) 40 MG tablet take 1 tablet by mouth twice a day 60 tablet 5  . Polyethylene Glycol 3350 (MIRALAX PO) Take 8.5 g by mouth at bedtime.     . ramipril (ALTACE) 10 MG capsule take 1 capsule by mouth once daily 30 capsule 3  . spironolactone (ALDACTONE) 25 MG tablet take 1 tablet by mouth every morning 30 tablet 5  . sucralfate (CARAFATE) 1 g tablet Take 1 g by mouth 2 (two) times daily as needed (acid reflux).    . traMADol (ULTRAM) 50 MG tablet Take 1 tablet (50 mg total) by mouth daily as needed. (Patient taking differently: Take 50 mg by mouth daily as needed for moderate pain. ) 20 tablet 0  . traZODone (DESYREL) 150 MG tablet take 1 and 2/3 tablets to 2 tablets by mouth at bedtime as directed 60 tablet 2  . verapamil (CALAN-SR) 180 MG CR tablet Take 1 tablet (180 mg total) by mouth at bedtime. (Patient taking differently: Take 180 mg by mouth 2 (two) times daily. ) 30 tablet 0  . vitamin B-12 (CYANOCOBALAMIN) 1000 MCG tablet Take 1,000 mcg by mouth daily.    . vitamin C (ASCORBIC ACID) 500  MG tablet Take 500 mg by mouth at bedtime.     No facility-administered medications prior to visit.      Allergies: Beta adrenergic blockers; Albuterol; Aspirin; Penicillins; Statins; Sulfa antibiotics; Tetracycline; and Tetracyclines & related    PHYSICAL EXAM: VS:  BP 100/62 (BP Location: Left Arm, Patient Position: Sitting, Cuff Size: Normal)   Pulse 85   Ht 5\' 3"  (1.6 m)   Wt 187 lb 4 oz (84.9 kg)   BMI 33.17 kg/m  , Body mass index is 33.17 kg/m. Wt Readings from Last 3 Encounters:  11/06/16 187 lb 4 oz (84.9 kg)  10/19/16 187 lb (84.8 kg)  10/11/16 186 lb 12 oz (84.7 kg)    GENERAL:  well developed, well nourished, obese, not in acute distress HEENT: normocephalic, pink conjunctivae, anicteric sclerae, no xanthelasma, normal dentition, oropharynx clear NECK:  no neck vein engorgement, JVP normal, no hepatojugular reflux, carotid upstroke brisk and symmetric, no bruit, no thyromegaly, no lymphadenopathy LUNGS:  good respiratory effort, clear to auscultation bilaterally CV:  PMI not displaced, no thrills, no lifts, Irregular S2 within normal limits, no palpable S3 or S4, no murmurs, no rubs, no gallops ABD:  Soft, nontender, nondistended, normoactive bowel sounds, no abdominal aortic bruit, no hepatomegaly, no splenomegaly MS: nontender back, no kyphosis, no scoliosis, no joint deformities EXT:  2+ DP/PT pulses, no edema, no varicosities, no cyanosis, no clubbing SKIN: warm, nondiaphoretic, normal turgor, no ulcers NEUROPSYCH: alert, oriented to person, place, and time, sensory/motor grossly intact, normal mood, appropriate affect   Recent Labs: 10/08/2016: ALT 22; B Natriuretic Peptide 566.0 10/09/2016: Magnesium 1.9 10/10/2016: Hemoglobin 12.7; Platelets 265 10/19/2016: BUN 24; Creatinine, Ser 0.87; Potassium 4.4; Sodium 135   Lipid Panel    Component Value Date/Time   CHOL 221 (H) 03/26/2016 0932   TRIG 119.0 03/26/2016  0932   HDL 54.80 03/26/2016 0932   CHOLHDL 4  03/26/2016 0932   VLDL 23.8 03/26/2016 0932   LDLCALC 143 (H) 03/26/2016 0932   LDLDIRECT 126.7 07/03/2013 0937     Other studies Reviewed:  EKG:  The ekg from 06/12/2016 was personally reviewed by me and it revealed sinus rhythm, 67 BPM. Low-voltage QRS.  EKG from 08/23/2016 was personally reviewed by me and it showed sinus rhythm, 70 BPM, frequent PVCs and bigeminy. Nonspecific ST-T wave changes.  Additional studies/ records that were reviewed personally reviewed by me today include:   Left heart cath 06/01/2016:  Prox LAD lesion, 100 %stenosed.  Ost Cx to Prox Cx lesion, 50 %stenosed.  Ost LM to LM lesion, 70 %stenosed.  Mid RCA lesion, 100 %stenosed.  Prox RCA lesion, 60 %stenosed.  SVG graft was visualized by angiography and is normal in caliber.  Post Atrio lesion, 60 %stenosed.  SVG graft was visualized by angiography and is normal in caliber.  The graft exhibits mild .  The flow in the graft is reversed.  Origin lesion, 30 %stenosed.  SVG graft was visualized by angiography.  Prox Graft lesion, 30 %stenosed.  LIMA graft was visualized by angiography and is normal in caliber and anatomically normal.  The left ventricular systolic function is normal.  LV end diastolic pressure is mildly elevated.  The left ventricular ejection fraction is 55-65% by visual estimate.  A STENT XIENCE ALPINE RX 3.0X15 drug eluting stent was successfully placed, and does not overlap previously placed stent.  Prox Graft lesion, 90 %stenosed.  Post intervention, there is a 0% residual stenosis.   1. Severe underlying three-vessel coronary artery disease with patent grafts including LIMA to LAD, SVG to diagonal, SVG to left circumflex and SVG to RCA. Severe 90% stenosis and SVG to RCA is likely the culprit for unstable angina.  2. Normal LV systolic function and mildly elevated left ventricular end-diastolic pressure.  3. Successful drug-eluting stent placement to the SVG  to RCA using a distal protection device.  Recommendations: The patient is severely intolerant to aspirin and she reports inability to take the medication. Thus, I elected to treat her with Brilinta monotherapy. Continue aggressive treatment for coronary artery disease.  Carotid u/s 04/25/2015, ordered by PCP: Less than 50% stenosis bilateral ICA Severe left ECA disease  Echo 06/19/2016: Left ventricle: The cavity size was normal. Systolic function was   normal. The estimated ejection fraction was in the range of 60%   to 65%. Wall motion was normal; there were no regional wall   motion abnormalities. Left ventricular diastolic function   parameters were normal. - Mitral valve: There was mild regurgitation. - Left atrium: The atrium was at the upper limits of normal in   size. - Right ventricle: Systolic function was normal. - Pulmonary arteries: Systolic pressure was mildly elevated. PA   peak pressure: 40 mm Hg (S).  Nuclear stress is 08/29/2016:  Pharmacological myocardial perfusion imaging study with no significant  ischemia Normal wall motion, EF estimated at 54% No EKG changes concerning for ischemia at peak stress or in recovery. Low risk scan  Holter 08/28/2016: Overall rhythm was sinus. Heart rate ranged from 43-82 bpm, average of 53 BPM.  Supraventricular ectopy: 61 isolated PACs. One 4 beat run of SVT, likely atrial tachycardia, 110 BPM.  Ventricular ectopy was 15% of the total number of beats: 12,062 isolated PVCs. 54 ventricular couplets. 6165 ventricular bigeminal cycles.  Echo 10/09/2016: Left ventricle: The cavity size was  normal. Systolic function was   normal. The estimated ejection fraction was in the range of 60%   to 65%. Wall motion was normal; there were no regional wall   motion abnormalities. The study is not technically sufficient to   allow evaluation of LV diastolic function. - Mitral valve: There was mild regurgitation. - Left atrium: The  atrium was moderately dilated. - Right ventricle: The cavity size was mildly dilated. Wall   thickness was normal. Systolic function was mildly reduced. - Pulmonary arteries: Systolic pressure was mild to moderately   elevated. PA peak pressure: 49 mm Hg (S).  Impressions:  - Rhythm is atrial fibrillation.  ASSESSMENT AND PLAN: Atiral fibrillation, rate controlled, Persistent Continues to be in A. fib today with controlled ventricular response. Continue Eliquis 5mg  bid. Follow-up in mid March to rediscuss option of doing cardioversion.  CAD Per last office note 10/11/2016:  CAD s/p CABG x 4 in 1998 in [place], [place].  Last cardiac eval took place in 2005 with stress testing and cath, apparently revealing 4/4 patent grafts. S/P LHCath and DES to SVG to RCA 06/01/2016. Other grafts patent Cont ?blocker - she is very sensitive to this. She had tried bid dosing and developed 1st degree avb. She is tolerating current dose of Zebeta 2.5mg  qd.  She did not tolerate Tenormin and metoprolol in the past - lowered blood pressure too much. Continue ACE inhibitor Resume home dose of zetia (statin intolerant).  She is allergic to ASA  - significant GI side effects. Developed significant bruising to Plavix.  Continue Plavix. We'll aim for 1 year duration post cath and drug-eluting stent placement.  Continue medical therapy. Continue cardiac rehabilitation.   Hypertension BP is well controlled. Continue monitoring BP. Continue current medical therapy and lifestyle changes.  Hyperlipidemia Per last office note 10/11/2016: Statin intolerant. Continue zetia. Patient developed significant muscle weakness and intolerance to rosuvastatin, atorvastatin, simvastatin, and pravastatin.  carotid artery disease  This is followed by PCP. Continue medical therapy  Current medicines are reviewed at length with the patient today.  The patient does not have concerns regarding medicines.  Labs/ tests  ordered today include:  Orders Placed This Encounter  Procedures  . EKG 12-Lead    I had a lengthy and detailed discussion with the patient regarding diagnoses, prognosis, diagnostic options, treatment options , and side effects of medications.   I counseled the patient on importance of lifestyle modification including heart healthy diet, regular physical activity .   Disposition:   FU with undersigned in March     Signed, Chloeann Alfred, MD  11/06/2016 4:09 PM    [place]  This note was generated in part with voice recognition software and I apologize for any typographical errors that were not detected and corrected.

## 2016-11-06 NOTE — Telephone Encounter (Signed)
-----   Message from Wende Bushy, MD sent at 11/06/2016  4:05 PM EST ----- Spoke to Dr. Fletcher Anon. Recommend to continue plavix until 05/2017 since stent was to a vein graft. Pls inform pt, thanks!

## 2016-11-07 ENCOUNTER — Other Ambulatory Visit: Payer: Self-pay | Admitting: Internal Medicine

## 2016-11-07 ENCOUNTER — Encounter: Payer: Self-pay | Admitting: *Deleted

## 2016-11-07 DIAGNOSIS — Z9861 Coronary angioplasty status: Secondary | ICD-10-CM

## 2016-11-07 NOTE — Progress Notes (Signed)
Cardiac Individual Treatment Plan  Patient Details  Name: Charlotte Wagner, Dr. MRN: [id number] Date of Birth: 1936/07/16 Referring Provider:   [facility] Row Cardiac Rehab from 06/21/2016 in [facility] Cardiac and Pulmonary Rehab  Referring Provider  Kathlyn Sacramento MD      Initial Encounter Date:  Flowsheet Row Cardiac Rehab from 06/21/2016 in [facility] Cardiac and Pulmonary Rehab  Date  06/21/16  Referring Provider  Kathlyn Sacramento MD      Visit Diagnosis: S/P PTCA (percutaneous transluminal coronary angioplasty)  Patient's Home Medications on Admission:  Current Outpatient Prescriptions:  .  acetaminophen (TYLENOL) 500 MG tablet, Take 1,000 mg by mouth 2 (two) times daily. , Disp: , Rfl:  .  amitriptyline (ELAVIL) 25 MG tablet, Take 1-2 tablets (25-50 mg total) by mouth at bedtime. (Patient taking differently: Take 25 mg by mouth at bedtime. ), Disp: 60 tablet, Rfl: 5 .  apixaban (ELIQUIS) 5 MG TABS tablet, Take 1 tablet (5 mg total) by mouth 2 (two) times daily., Disp: 60 tablet, Rfl: 0 .  b complex vitamins tablet, Take 1 tablet by mouth daily., Disp: , Rfl:  .  bisoprolol (ZEBETA) 5 MG tablet, Take 1 tablet (5 mg total) by mouth at bedtime. (Patient taking differently: Take 2.5 mg by mouth at bedtime. ), Disp: 30 tablet, Rfl: 3 .  Cholecalciferol (VITAMIN D3) 2000 UNITS TABS, Take 1 capsule by mouth daily. , Disp: , Rfl:  .  clopidogrel (PLAVIX) 75 MG tablet, Take 1 tablet (75 mg total) by mouth daily., Disp: 30 tablet, Rfl: 0 .  Coenzyme Q10 (COQ-10) 100 MG CAPS, Take 1 tablet by mouth daily., Disp: , Rfl:  .  ezetimibe (ZETIA) 10 MG tablet, take 1 tablet by mouth once daily, Disp: 30 tablet, Rfl: 7 .  Fluticasone-Salmeterol (ADVAIR) 250-50 MCG/DOSE AEPB, Inhale 1 puff into the lungs 2 (two) times daily as needed., Disp: , Rfl:  .  folic acid (FOLVITE) 1 MG tablet, take 1 tablet by mouth once daily, Disp: 30 tablet, Rfl: 5 .  furosemide (LASIX) 20 MG tablet, Take 0.5 tablets (10 mg total) by  mouth daily., Disp: 15 tablet, Rfl: 3 .  hydrocortisone 2.5 % cream, apply to VULVA twice a day AT 10AM AND 4 PM, Disp: 28 g, Rfl: 0 .  ipratropium (ATROVENT HFA) 17 MCG/ACT inhaler, Inhale 1 puff into the lungs every 4 (four) hours as needed for wheezing., Disp: , Rfl:  .  ketoconazole (NIZORAL) 2 % cream, Apply 1 application topically daily. (Patient taking differently: Apply 1 application topically daily as needed for irritation. ), Disp: 30 g, Rfl: 0 .  loratadine (CLARITIN) 10 MG tablet, Take 10 mg by mouth daily., Disp: , Rfl:  .  meclizine (ANTIVERT) 25 MG tablet, Take 25 mg by mouth 3 (three) times daily as needed for dizziness., Disp: , Rfl:  .  miconazole (MICOTIN) 2 % cream, Apply 1 application topically 2 (two) times daily as needed (irritation)., Disp: , Rfl:  .  Multiple Vitamins-Minerals (HAIR/SKIN/NAILS PO), Take 1 tablet by mouth daily., Disp: , Rfl:  .  nitroGLYCERIN (NITROSTAT) 0.4 MG SL tablet, Place 1 tablet (0.4 mg total) under the tongue every 5 (five) minutes as needed for chest pain. Place 1 tablet (0.4 mg total) under the tongue every 5 (five) minutes , 3 times as needed for chest pain., Disp: 15 tablet, Rfl: 0 .  Omega-3 Fatty Acids (FISH OIL PO), Take 2,000 mg by mouth daily., Disp: , Rfl:  .  pantoprazole (PROTONIX) 40  MG tablet, take 1 tablet by mouth twice a day, Disp: 60 tablet, Rfl: 5 .  Polyethylene Glycol 3350 (MIRALAX PO), Take 8.5 g by mouth at bedtime. , Disp: , Rfl:  .  ramipril (ALTACE) 10 MG capsule, take 1 capsule by mouth once daily, Disp: 30 capsule, Rfl: 3 .  spironolactone (ALDACTONE) 25 MG tablet, take 1 tablet by mouth every morning, Disp: 30 tablet, Rfl: 5 .  sucralfate (CARAFATE) 1 g tablet, Take 1 g by mouth 2 (two) times daily as needed (acid reflux)., Disp: , Rfl:  .  traMADol (ULTRAM) 50 MG tablet, Take 1 tablet (50 mg total) by mouth daily as needed. (Patient taking differently: Take 50 mg by mouth daily as needed for moderate pain. ), Disp: 20  tablet, Rfl: 0 .  traZODone (DESYREL) 150 MG tablet, take 1 and 2/3 tablets to 2 tablets by mouth at bedtime as directed, Disp: 60 tablet, Rfl: 2 .  verapamil (CALAN-SR) 180 MG CR tablet, Take 1 tablet (180 mg total) by mouth at bedtime. (Patient taking differently: Take 180 mg by mouth 2 (two) times daily. ), Disp: 30 tablet, Rfl: 0 .  vitamin B-12 (CYANOCOBALAMIN) 1000 MCG tablet, Take 1,000 mcg by mouth daily., Disp: , Rfl:  .  vitamin C (ASCORBIC ACID) 500 MG tablet, Take 500 mg by mouth at bedtime., Disp: , Rfl:   Past Medical History: Past Medical History:  Diagnosis Date  . Allergy   . Arthritis    s/p bilateral knees and left hip replacement  . Asthma   . CAD (coronary artery disease)    a. 12/1996 s/p CABG x4 ([place]);  b. 2005 Pt reports stress test & cath, which revealed patent grafts.  . Cancer ([place])    melanoma right arm  . Carotid arterial disease ([place])    a. 04/2015 Carotid U/S: <50% bilat ICA stenosis.  . Chronic kidney disease   . Colon polyps    H/O  . Depression   . GERD (gastroesophageal reflux disease)    h/o hiatal hernia  . Headache    migraines in past  . Heart murmur    a. 04/2011 Echo: EF 55-60%, bilat atrial enlargement, mild to mod TR.  [name] History of chicken pox   . History of hiatal hernia   . Hx of migraines    rare now  . Hx: UTI (urinary tract infection)   . Hyperlipidemia    a. Statin intolerant -->on zetia.  . Hypertensive heart disease   . Lichen planus   . Melanoma ([place])   . Palpitations    a. rare PVC's and h/o SVT.  [name] PMR (polymyalgia rheumatica) (H[facility])    h/o in setting of crestor usage.  [name] PSVT (paroxysmal supraventricular tachycardia) ([place])   . PUD (peptic ulcer disease)    remote history  . Raynaud's phenomenon   . Spinal stenosis   . Urine incontinence    H/O  . Vitamin D deficiency     Tobacco Use: History  Smoking Status  . Former Smoker  Smokeless Tobacco  . Never Used    Comment: quit 44+ yrs ago     Labs: Recent Review Flowsheet Data    Labs for ITP Cardiac and Pulmonary Rehab Latest Ref Rng & Units 06/15/2014 12/02/2014 03/21/2015 09/12/2015 03/26/2016   Cholestrol 0 - 200 mg/dL 244(H) 193 208(H) 204(H) 221(H)   LDLCALC 0 - 99 mg/dL 170(H) 120(H) 140(H) 137(H) 143(H)   LDLDIRECT mg/dL - - - - -  HDL >39.00 mg/dL 46.80 50.00 49.90 49.70 54.80   Trlycerides 0.0 - 149.0 mg/dL 137.0 114.0 92.0 86.0 119.0       Exercise Target Goals:    Exercise Program Goal: Individual exercise prescription set with THRR, safety & activity barriers. Participant demonstrates ability to understand and report RPE using BORG scale, to self-measure pulse accurately, and to acknowledge the importance of the exercise prescription.  Exercise Prescription Goal: Starting with aerobic activity 30 plus minutes a day, 3 days per week for initial exercise prescription. Provide home exercise prescription and guidelines that participant acknowledges understanding prior to discharge.  Activity Barriers & Risk Stratification:     Activity Barriers & Cardiac Risk Stratification - 06/21/16 1434      Activity Barriers & Cardiac Risk Stratification   Activity Barriers Arthritis;Joint Problems;Shortness of Breath;Back Problems;Balance Concerns;Assistive Device;Left Hip Replacement;Left Knee Replacement;Right Knee Replacement;Neck/Spine Problems;Deconditioning;Muscular Weakness;History of Falls   Cardiac Risk Stratification High      6 Minute Walk:     6 Minute Walk    Row Name 06/21/16 1435         6 Minute Walk   Phase Initial     Distance 1060 feet     Walk Time 6 minutes     # of Rest Breaks 0     MPH 2.01     METS 1.75     RPE 13     Perceived Dyspnea  2     VO2 Peak 6.12     Symptoms Yes (comment)     Comments back pain     Resting HR 75 bpm     Resting BP 132/82     Max Ex. HR 98 bpm     Max Ex. BP 136/64     2 Minute Post BP 130/66        Initial Exercise Prescription:     Initial  Exercise Prescription - 06/21/16 1400      Date of Initial Exercise RX and Referring Provider   Date 06/21/16   Referring Provider Kathlyn Sacramento MD     Treadmill   MPH 1   Grade 0   Minutes 15   METs 1.77     NuStep   Level 2   Minutes 15   METs 1.8     Biostep-RELP   Level 2   Minutes 15   METs 2     Prescription Details   Frequency (times per week) 3   Duration Progress to 45 minutes of aerobic exercise without signs/symptoms of physical distress     Intensity   THRR 40-80% of Max Heartrate 102-129   Ratings of Perceived Exertion 11-15   Perceived Dyspnea 0-4     Progression   Progression Continue to progress workloads to maintain intensity without signs/symptoms of physical distress.     Resistance Training   Training Prescription Yes   Weight 2 lbs   Reps 10-12      Perform Capillary Blood Glucose checks as needed.  Exercise Prescription Changes:     Exercise Prescription Changes    Row Name 06/21/16 1400 07/11/16 1400 07/12/16 1700 07/26/16 1200 08/08/16 1300     Exercise Review   Progression -  walk test results Yes Yes Yes Yes     Response to Exercise   Blood Pressure (Admit) 132/82 148/74 148/74 132/64 130/80   Blood Pressure (Exercise) 136/64 142/82 142/82 148/70  -   Blood Pressure (Exit) 130/66 116/60 116/60 124/66 124/70   Heart Rate (  Admit) 75 bpm 50 bpm 50 bpm 68 bpm 80 bpm   Heart Rate (Exercise) 98 bpm 116 bpm 116 bpm 110 bpm 105 bpm   Heart Rate (Exit)  - 71 bpm 71 bpm 68 bpm 66 bpm   Oxygen Saturation (Admit) 97 %  -  -  -  -   Rating of Perceived Exertion (Exercise) '13 14 14 14 13   '$ Perceived Dyspnea (Exercise) 2  -  -  -  -   Symptoms back pain  -  -  -  -   Comments  -  - Home Exercise Guidelines given 07/12/16  -  -   Duration  - Progress to 45 minutes of aerobic exercise without signs/symptoms of physical distress Progress to 45 minutes of aerobic exercise without signs/symptoms of physical distress Progress to 45 minutes of  aerobic exercise without signs/symptoms of physical distress Progress to 45 minutes of aerobic exercise without signs/symptoms of physical distress   Intensity  - THRR unchanged THRR unchanged THRR unchanged THRR unchanged     Progression   Progression  - Continue to progress workloads to maintain intensity without signs/symptoms of physical distress. Continue to progress workloads to maintain intensity without signs/symptoms of physical distress. Continue to progress workloads to maintain intensity without signs/symptoms of physical distress. Continue to progress workloads to maintain intensity without signs/symptoms of physical distress.   Average METs  - 2.14 2.14 2.9 2.53     Resistance Training   Training Prescription  - Yes Yes Yes Yes   Weight  - '3 3 3 3   '$ Reps  - 10-12 10-12 10-12 10-12     Interval Training   Interval Training  - No No No No     Treadmill   MPH  - 1 1 1.9 2   Grade  - 0 0 0 0   Minutes  - '15 15 15 15   '$ METs  - 1.77 1.77  - 2.53     NuStep   Level  - '6 6 6  '$ -   Minutes  - '15 15 15  '$ -   METs  - 2.5 2.5 2.9  -     Biostep-RELP   Level  -  -  -  - 3   Minutes  -  -  -  - 15     Home Exercise Plan   Plans to continue exercise at  -  - [employer] (comment)  walking and [employer]  -  -   Frequency  -  - Add 3 additional days to program exercise sessions.  -  -   Row Name 08/23/16 1200 09/06/16 1100 09/20/16 1100 10/05/16 1000 10/19/16 1200     Exercise Review   Progression Yes Yes  - Yes Yes     Response to Exercise   Blood Pressure (Admit) 138/78 138/78 126/64 132/64 118/64   Blood Pressure (Exercise) 144/70 144/70 144/72 128/64 126/84   Blood Pressure (Exit) 132/72 132/72 120/58 110/60 104/62   Heart Rate (Admit) 78 bpm 78 bpm 78 bpm 95 bpm 68 bpm   Heart Rate (Exercise) 90 bpm 90 bpm 81 bpm 103 bpm 108 bpm   Heart Rate (Exit) 65 bpm 65 bpm 64 bpm 94 bpm 76 bpm   Rating of Perceived Exertion (Exercise) '13 13 12 12 13   '$ Symptoms  -  -  -  none none   Comments  -  -  - Home Exercise Guidelines given 07/12/16  -  Duration Progress to 45 minutes of aerobic exercise without signs/symptoms of physical distress Progress to 45 minutes of aerobic exercise without signs/symptoms of physical distress Progress to 45 minutes of aerobic exercise without signs/symptoms of physical distress Progress to 45 minutes of aerobic exercise without signs/symptoms of physical distress Progress to 45 minutes of aerobic exercise without signs/symptoms of physical distress   Intensity THRR unchanged THRR unchanged THRR unchanged THRR unchanged THRR unchanged     Progression   Progression Continue to progress workloads to maintain intensity without signs/symptoms of physical distress. Continue to progress workloads to maintain intensity without signs/symptoms of physical distress. Continue to progress workloads to maintain intensity without signs/symptoms of physical distress. Continue to progress workloads to maintain intensity without signs/symptoms of physical distress. Continue to progress workloads to maintain intensity without signs/symptoms of physical distress.   Average METs 3.6 3.6 2.55 2.54  -     Resistance Training   Training Prescription Yes Yes Yes Yes Yes   Weight '3 3 3 3 3   '$ Reps 10-12 10-12 10-12 10-12 10-12     Interval Training   Interval Training No No No No No     Treadmill   MPH  -  -  - 2  -   Grade  -  -  - 0  -   Minutes  -  -  - 15  -   METs  -  -  - 2.53  -     NuStep   Level '6 6 6 6 6   '$ Minutes '15 15 15 15 15   '$ METs 3.2 3.2 3.1 3.1 2.5     Biostep-RELP   Level  -  - '3 4 3   '$ Minutes  -  - '15 15 15   '$ METs  -  - 2 3  -     Home Exercise Plan   Plans to continue exercise at  -  -  - [employer] (comment)  walking and [employer]  -   Frequency  -  -  - Add 3 additional days to program exercise sessions.  -   Row Name 11/01/16 1200             Response to Exercise   Blood Pressure (Exercise) 132/70        Blood Pressure (Exit) 110/70       Heart Rate (Admit) 84 bpm       Heart Rate (Exercise) 101 bpm       Heart Rate (Exit) 87 bpm       Rating of Perceived Exertion (Exercise) 12       Symptoms none       Duration Progress to 50 minutes of aerobic without signs/symptoms of physical distress       Intensity THRR unchanged         Progression   Progression Continue to progress workloads to maintain intensity without signs/symptoms of physical distress.         Resistance Training   Training Prescription Yes       Weight 3       Reps 10-12         Treadmill   MPH 2       Grade 0       Minutes 15       METs 2.53         Biostep-RELP   Level 3       Minutes 15  METs 2          Exercise Comments:     Exercise Comments    Row Name 06/28/16 1812 07/11/16 1421 07/12/16 1723 07/26/16 1246 08/08/16 1314   Exercise Comments First full day of exercise!  Patient was oriented to gym and equipment including functions, settings, policies, and procedures.  Patient's individual exercise prescription and treatment plan were reviewed.  All starting workloads were established based on the results of the 6 minute walk test done at initial orientation visit.  The plan for exercise progression was also introduced and progression will be customized based on patient's performance and goals. Sakoya is progressing well with exercise. Reviewed home exercise with pt today.  Pt plans to walk at home and go to class/gym at [facility] for exercise.  Reviewed THR, pulse, RPE, sign and symptoms, NTG use, and when to call 911 or MD.  Also discussed weather considerations and indoor options.  Pt voiced understanding. Jnya continues to progress well with exercise. Gilberta is porgressing well with exercise.   [place] Name 08/23/16 1300 09/06/16 1118 09/13/16 1722 09/20/16 1114 10/05/16 1032   Exercise Comments Wavie is doing well with exercise during HT and also using the facility at [facility] on days she doen't  attend HT. Sada has not attended since 08/20/16. First day back - starting back easy with no treadmill. Chrisy is just using the NS and Biostep since she has returned to [facility] after missing sessions. Kymoni has added the treadmill back into her rotation.  She has been out with a cold for the past week.  We will monitor her workloads and progress upon return.   [place] Name 10/19/16 1213 11/01/16 1245         Exercise Comments Colandra has had some medication changes and is staring back exercise so she has decreased workloads on some machines. Lou has tolerated exercise well since returning to HT.         Discharge Exercise Prescription (Final Exercise Prescription Changes):     Exercise Prescription Changes - 11/01/16 1200      Response to Exercise   Blood Pressure (Exercise) 132/70   Blood Pressure (Exit) 110/70   Heart Rate (Admit) 84 bpm   Heart Rate (Exercise) 101 bpm   Heart Rate (Exit) 87 bpm   Rating of Perceived Exertion (Exercise) 12   Symptoms none   Duration Progress to 50 minutes of aerobic without signs/symptoms of physical distress   Intensity THRR unchanged     Progression   Progression Continue to progress workloads to maintain intensity without signs/symptoms of physical distress.     Resistance Training   Training Prescription Yes   Weight 3   Reps 10-12     Treadmill   MPH 2   Grade 0   Minutes 15   METs 2.53     Biostep-RELP   Level 3   Minutes 15   METs 2      Nutrition:  Target Goals: Understanding of nutrition guidelines, daily intake of sodium '1500mg'$ , cholesterol '200mg'$ , calories 30% from fat and 7% or less from saturated fats, daily to have 5 or more servings of fruits and vegetables.  Biometrics:     Pre Biometrics - 06/21/16 1443      Pre Biometrics   Height 5' 3.5" (1.613 m)   Weight 184 lb 8 oz (83.7 kg)   Waist Circumference 37.5 inches   Hip Circumference 47 inches   Waist to Hip Ratio 0.8 %  BMI (Calculated) 32.2   Single Leg  Stand 1.1 seconds       Nutrition Therapy Plan and Nutrition Goals:     Nutrition Therapy & Goals - 06/21/16 1455      Nutrition Therapy   Diet --  Shelva prefers not to meet individually with the Cardiac Rehab Registered Dietician.      Nutrition Discharge: Rate Your Plate Scores:     Nutrition Assessments - 06/21/16 1456      Rate Your Plate Scores   Pre Score 58   Pre Score % 64 %      Nutrition Goals Re-Evaluation:     Nutrition Goals Re-Evaluation    [place] Name 08/06/16 1406 10/22/16 1655           Personal Goal #1 Re-Evaluation   Personal Goal #1 Cont heart healthy diet. 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 "I know what to eat but always I haven't eaten correctly" Dr. Estill Bamberg said she went to the heart failure clinic and "they noticed that I knew what I was doing about eating and controlling my sodium         Psychosocial: Target Goals: Acknowledge presence or absence of depression, maximize coping skills, provide positive support system. Participant is able to verbalize types and ability to use techniques and skills needed for reducing stress and depression.  Initial Review & Psychosocial Screening:     Initial Psych Review & Screening - 06/21/16 1441      Initial Review   Current issues with History of Depression     Family Dynamics   Good Support System? Yes   Comments Bryelle reports that she has a family history of depression. 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     Screening Interventions   Interventions Encouraged to exercise      Quality of Life Scores:     Quality of Life - 06/21/16 1442      Quality of Life Scores   Health/Function Pre 23.18 %   Socioeconomic Pre 28.93 %    Psych/Spiritual Pre 29.14 %   Family Pre 30 %   GLOBAL Pre 26.59 %      PHQ-9: Recent Review Flowsheet Data    Depression screen [facility] 2/9 10/19/2016 09/06/2016 06/21/2016 06/14/2016 02/23/2016   Decreased Interest 0 0 1 0 0   Down, Depressed, Hopeless 1 0 1 0 0   PHQ - 2 Score 1 0 2 0 0   Altered sleeping - - 2 - -   Tired, decreased energy - - 1 - -   Change in appetite - - 0 - -   Feeling bad or failure about yourself  - - 0 - -   Trouble concentrating - - 0 - -   Moving slowly or fidgety/restless - - 0 - -   Suicidal thoughts - - 0 - -   PHQ-9 Score - - 5 - -   Difficult doing work/chores - - Somewhat difficult - -      Psychosocial Evaluation and Intervention:     Psychosocial Evaluation - 07/02/16 1721      Psychosocial Evaluation & Interventions   Interventions Encouraged to exercise with the program and  follow exercise prescription   Comments Counselor met with Ms. Shimmin today for initial psychosocial evaluation.  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  Ms. Walthour lives in a retirement community and has a strong support system with a daughter who lives close by and active involvement in her local church.  Ms. Zachman has multiple health issues and reports she is sleeping better now with some medication adjustments.  She admits to a family history of depression and anxiety and she has been taking medications for her own mood issues for quite some time.  She reports her current mood is typically positive.  Ms. Shambaugh states her primary stressors are her health currently which includes bouts of breathlessness; as well as experiencing a great deal of pain when she stands for long periods of time; which she attributes to her spinal stenosis which keeps her bent over and walking with a cane or walker most of the day.  Counselor will continue  to follow with Ms. Pecor while in this program.        Psychosocial Re-Evaluation:     Psychosocial Re-Evaluation    Row Name 08/06/16 1412 08/23/16 1424 10/22/16 1658 11/05/16 1725       Psychosocial Re-Evaluation   Interventions Encouraged to attend Cardiac Rehabilitation for the exercise  -  -  -    Comments Charee said it was stressful getting a notice from [facility] that they would not pay for what Medicare doesn't pay for. Caylah said she was glad to find out that Medicare does cover Cardiac Rehab.  Audrianna called today and said she is sorry that she will miss Cardiac Rehab but  she will be out a little bit since she reports that MD wants to find out what is going on with her heart.  Dr. Marchelle Folks says she feels better and not as stressed since they figured out her breathlessness was due to one of her medicines that the doctor stopped.  Counselor follow up with Dr. Caryl Comes today reporting feeling somewhat "frustrated" with her health and how one medication impacts another causing negative reactions.  She will see the Dr. tomorrow and hopes he will help her figure some of this out.  She is also recognizing the need to accept her current condition and not having unrealistic expectations currently - with the balance of not giving up on exercise and her part in this process.  Counselor offered supportive services and will continue to follow with her.       Vocational Rehabilitation: Provide vocational rehab assistance to qualifying candidates.   Vocational Rehab Evaluation & Intervention:     Vocational Rehab - 06/21/16 1435      Initial Vocational Rehab Evaluation & Intervention   Assessment shows need for Vocational Rehabilitation No      Education: Education Goals: Education classes will be provided on a weekly basis, covering required topics. Participant will state understanding/return demonstration of topics presented.  Learning Barriers/Preferences:     Learning  Barriers/Preferences - 06/21/16 1434      Learning Barriers/Preferences   Learning Barriers None;Exercise Concerns   Learning Preferences Audio;Group Instruction;Written Material      Education Topics: General Nutrition Guidelines/Fats and Fiber: -Group instruction provided by verbal, written material, models and posters to present the general guidelines for heart healthy nutrition. Gives an  explanation and review of dietary fats and fiber.   Controlling Sodium/Reading Food Labels: -Group verbal and written material supporting the discussion of sodium use in heart healthy nutrition. Review and explanation with models, verbal and written materials for utilization of the food label.   Exercise Physiology & Risk Factors: - Group verbal and written instruction with models to review the exercise physiology of the cardiovascular system and associated critical values. Details cardiovascular disease risk factors and the goals associated with each risk factor. Flowsheet Row Cardiac Rehab from 07/23/2016 in [facility] Cardiac and Pulmonary Rehab  Date  07/09/16  Educator  [facility]  Instruction Review Code  2- meets goals/outcomes      Aerobic Exercise & Resistance Training: - Gives group verbal and written discussion on the health impact of inactivity. On the components of aerobic and resistive training programs and the benefits of this training and how to safely progress through these programs. Flowsheet Row Cardiac Rehab from 07/23/2016 in [facility] Cardiac and Pulmonary Rehab  Date  07/16/16  Educator  [facility]  Instruction Review Code  2- meets goals/outcomes      Flexibility, Balance, General Exercise Guidelines: - Provides group verbal and written instruction on the benefits of flexibility and balance training programs. Provides general exercise guidelines with specific guidelines to those with heart or lung disease. Demonstration and skill practice provided.   Stress Management: - Provides group verbal  and written instruction about the health risks of elevated stress, cause of high stress, and healthy ways to reduce stress.   Depression: - Provides group verbal and written instruction on the correlation between heart/lung disease and depressed mood, treatment options, and the stigmas associated with seeking treatment.   Anatomy & Physiology of the Heart: - Group verbal and written instruction and models provide basic cardiac anatomy and physiology, with the coronary electrical and arterial systems. Review of: AMI, Angina, Valve disease, Heart Failure, Cardiac Arrhythmia, Pacemakers, and the ICD. Flowsheet Row Cardiac Rehab from 07/23/2016 in [facility] Cardiac and Pulmonary Rehab  Date  07/23/16  Educator  CE  Instruction Review Code  2- meets goals/outcomes      Cardiac Procedures: - Group verbal and written instruction and models to describe the testing methods done to diagnose heart disease. Reviews the outcomes of the test results. Describes the treatment choices: Medical Management, Angioplasty, or Coronary Bypass Surgery.   Cardiac Medications: - Group verbal and written instruction to review commonly prescribed medications for heart disease. Reviews the medication, class of the drug, and side effects. Includes the steps to properly store meds and maintain the prescription regimen.   Go Sex-Intimacy & Heart Disease, Get SMART - Goal Setting: - Group verbal and written instruction through game format to discuss heart disease and the return to sexual intimacy. Provides group verbal and written material to discuss and apply goal setting through the application of the S.M.A.R.T. Method.   Other Matters of the Heart: - Provides group verbal, written materials and models to describe Heart Failure, Angina, Valve Disease, and Diabetes in the realm of heart disease. Includes description of the disease process and treatment options available to the cardiac patient. Flowsheet Row Cardiac Rehab  from 07/23/2016 in [facility] Cardiac and Pulmonary Rehab  Date  07/23/16  Educator  CE  Instruction Review Code  2- meets goals/outcomes      Exercise & Equipment Safety: - Individual verbal instruction and demonstration of equipment use and safety with use of the equipment. Flowsheet Row Cardiac Rehab from 07/23/2016 in [facility] Cardiac  and Pulmonary Rehab  Date  06/21/16  Educator  C. EnterkinRN  Instruction Review Code  1- partially meets, needs review/practice      Infection Prevention: - Provides verbal and written material to individual with discussion of infection control including proper hand washing and proper equipment cleaning during exercise session. Flowsheet Row Cardiac Rehab from 07/23/2016 in [facility] Cardiac and Pulmonary Rehab  Date  06/21/16  Educator  C. EnterkinRN  Instruction Review Code  2- meets goals/outcomes      Falls Prevention: - Provides verbal and written material to individual with discussion of falls prevention and safety. Flowsheet Row Cardiac Rehab from 07/23/2016 in [facility] Cardiac and Pulmonary Rehab  Date  06/21/16  Educator  [place]  Instruction Review Code  2- meets goals/outcomes      Diabetes: - Individual verbal and written instruction to review signs/symptoms of diabetes, desired ranges of glucose level fasting, after meals and with exercise. Advice that pre and post exercise glucose checks will be done for 3 sessions at entry of program.    Knowledge Questionnaire Score:     Knowledge Questionnaire Score - 06/21/16 1435      Knowledge Questionnaire Score   Pre Score 26      Core Components/Risk Factors/Patient Goals at Admission:     Personal Goals and Risk Factors at Admission - 06/21/16 1440      Core Components/Risk Factors/Patient Goals on Admission   Increase Strength and Stamina Yes   Intervention Provide advice, education, support and counseling about physical activity/exercise needs.;Develop an individualized exercise  prescription for aerobic and resistive training based on initial evaluation findings, risk stratification, comorbidities and participant's personal goals.   Expected Outcomes Achievement of increased cardiorespiratory fitness and enhanced flexibility, muscular endurance and strength shown through measurements of functional capacity and personal statement of participant.   Hypertension Yes   Intervention Provide education on lifestyle modifcations including regular physical activity/exercise, weight management, moderate sodium restriction and increased consumption of fresh fruit, vegetables, and low fat dairy, alcohol moderation, and smoking cessation.;Monitor prescription use compliance.   Expected Outcomes Short Term: Continued assessment and intervention until BP is < 140/55m HG in hypertensive participants. < 130/860mHG in hypertensive participants with diabetes, heart failure or chronic kidney disease.;Long Term: Maintenance of blood pressure at goal levels.   Lipids Yes   Intervention Provide education and support for participant on nutrition & aerobic/resistive exercise along with prescribed medications to achieve LDL '70mg'$ , HDL >'40mg'$ .   Expected Outcomes Short Term: Participant states understanding of desired cholesterol values and is compliant with medications prescribed. Participant is following exercise prescription and nutrition guidelines.;Long Term: Cholesterol controlled with medications as prescribed, with individualized exercise RX and with personalized nutrition plan. Value goals: LDL < '70mg'$ , HDL > 40 mg.      Core Components/Risk Factors/Patient Goals Review:      Goals and Risk Factor Review    Row Name 07/12/16 1721 08/06/16 1409 09/27/16 1709 10/22/16 1657       Core Components/Risk Factors/Patient Goals Review   Personal Goals Review Weight Management/Obesity;Hypertension;Lipids  - Hypertension;Lipids  -    Review AmMees trying to "behave" with watching her diet more  closely.  Her blood pressures have been lower than normal.  She is tolerating her statins without problems. AmLoettaaid her blood pressure when she first started on teh Brilinta was 100systolic but is better now at 120/70. AmDannonaid she "displaced her iliac joints so didn't use the treadmill last week but did use  the other machines without any problems. Rashea reports that her stamina has gotten better since "she pushes her self alot more than if she was exercising on her own. " Bp has been good, cholesterol hasn't been checked recently.  Jazmen is exercising at [facility] water exercise twice per week and the fitness room 3 times per week. Still exercising and went to heart failure clinic and will cont to follow up with them.    Expected Outcomes Eldonna will continue to come to exercise and education classes.  We will continue to monitor for progression. Cont heart healthy lifestyle Latronda will continue to exercise and maintain her cholesterol and BP at levels her Dr feels are best for her.   Heart healthy lifestyle       Core Components/Risk Factors/Patient Goals at Discharge (Final Review):      Goals and Risk Factor Review - 10/22/16 1657      Core Components/Risk Factors/Patient Goals Review   Review Still exercising and went to heart failure clinic and will cont to follow up with them.   Expected Outcomes Heart healthy lifestyle      ITP Comments:     ITP Comments    Row Name 06/21/16 1438 06/21/16 1442 06/28/16 1812 07/18/16 0709 08/15/16 0633   ITP Comments Shavaughn walks with a cane. She uses a rolling walker at homes at times. Annaleia reports history that she tripped over something and fell and bruised her leg. 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 Allani also doesn't feel that she needs to meet individually with the Cardiac REhab REgisterd Dietician. "I know what to eat" Keriana reports that she has a family history of depression. Aiyla said she  started herself on antidepressants after she asked her husband to leave and she was raising 3 children alone plus private MD practice. First full day of exercise!  Patient was oriented to gym and equipment including functions, settings, policies, and procedures.  Patient's individual exercise prescription and treatment plan were reviewed.  All starting workloads were established based on the results of the 6 minute walk test done at initial orientation visit.  The plan for exercise progression was also introduced and progression will be customized based on patient's performance and goals. 30 day review. Continue with ITP unless changes noted by Medical Director at signature of review.  New to program 30 day review completed for Medical Director physician review and signature. Continue ITP unless changes made by physician.   [place] Name 08/16/16 1719 08/20/16 1746 08/23/16 1423 09/06/16 1119 09/12/16 0631   ITP Comments Patient tolerated exercise well. She did report that she has noticed some irregular heart beats with no pain or symptoms associated with this. She did show some sporadic irregular beats on her EKG strip during exercise. She was advised to talk with her doctor and she stated that she would call him.  Berneda reports that her MD has ordered a heart monitor for her since she has been feeliing some irregular beats. Satsuki called today and said she will be out a little bit since she reports that MD wants to find out what is going on with her heart.  Marycarmen has not attended since 08/20/16. 30 day review completed for review by Dr Emily Filbert.  Continue with ITP unless changes noted by Dr Sabra Heck. Remains absent. Has a 30 day monitor on.   [place] Name 10/05/16 1031 10/09/16 1478 11/07/16 0646       ITP  Comments Iyania has been out with a cough and cold symptoms.  She did have a call in to the doctor's office today. 30 day review. Continue with ITP unless directed changes per Medical Director review. 30 day  review. Continue with ITP unless directed changes per Medical Director review.        Comments:

## 2016-11-08 ENCOUNTER — Encounter: Payer: Medicare Other | Attending: Cardiovascular Disease

## 2016-11-08 ENCOUNTER — Ambulatory Visit: Payer: Medicare Other | Admitting: Cardiology

## 2016-11-08 DIAGNOSIS — Z955 Presence of coronary angioplasty implant and graft: Secondary | ICD-10-CM | POA: Insufficient documentation

## 2016-11-08 DIAGNOSIS — Z9861 Coronary angioplasty status: Secondary | ICD-10-CM

## 2016-11-08 NOTE — Progress Notes (Signed)
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: [id number] Date of Birth: 10/30/1935 Referring Provider:   Flowsheet Row Cardiac Rehab from 06/21/2016 in [facility] Cardiac and Pulmonary Rehab  Referring Provider  Arida, Muhammad MD      Encounter Date: 11/08/2016  Check In:     Session Check In - 11/08/16 1638      Check-In   Location [facility]-Cardiac & Pulmonary Rehab   Staff Present Carroll Enterkin, RN, BSN;Shonique Sommer, BA, ACSM CEP, Exercise Physiologist;Kelly Hayes, BS, ACSM CEP, Exercise Physiologist   Supervising physician immediately available to respond to emergencies See telemetry face sheet for immediately available ER MD   Medication changes reported     No   Fall or balance concerns reported    No   Warm-up and Cool-down Performed on first and last piece of equipment   Resistance Training Performed Yes   VAD Patient? No     Pain Assessment   Currently in Pain? No/denies         Goals Met:  Independence with exercise equipment Exercise tolerated well No report of cardiac concerns or symptoms Strength training completed today  Goals Unmet:  Not Applicable  Comments: Pt able to follow exercise prescription today without complaint.  Will continue to monitor for progression.    Dr. Mark Miller is Medical Director for [facility] and [facility]. 

## 2016-11-08 NOTE — Telephone Encounter (Signed)
Last OV 09/06/16 last filled 02/06/16 28g 0rf

## 2016-11-12 ENCOUNTER — Encounter: Payer: Medicare Other | Admitting: *Deleted

## 2016-11-12 VITALS — Ht 63.5 in | Wt 185.7 lb

## 2016-11-12 DIAGNOSIS — Z9861 Coronary angioplasty status: Secondary | ICD-10-CM

## 2016-11-12 DIAGNOSIS — Z955 Presence of coronary angioplasty implant and graft: Secondary | ICD-10-CM | POA: Diagnosis not present

## 2016-11-12 NOTE — Progress Notes (Signed)
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: [id number] Date of Birth: [birth year] Referring Provider:   Flowsheet Row Cardiac Rehab from 06/21/2016 in [facility] Cardiac and Pulmonary Rehab  Referring Provider  Kathlyn Sacramento MD      Encounter Date: 11/12/2016  Check In:     Session Check In - 11/12/16 1735      Check-In   Location [facility]-Cardiac & Pulmonary Rehab   Staff Present Gerlene Burdock, RN, Moises Blood, BS, ACSM CEP, Exercise Physiologist;Mary Kellie Shropshire, RN, BSN, MA   Supervising physician immediately available to respond to emergencies See telemetry face sheet for immediately available ER MD   Medication changes reported     No   Fall or balance concerns reported    No   Warm-up and Cool-down Performed on first and last piece of equipment   Resistance Training Performed Yes   VAD Patient? No     Pain Assessment   Currently in Pain? No/denies   Multiple Pain Sites No         Goals Met:  Proper associated with RPD/PD & O2 Sat Independence with exercise equipment Exercise tolerated well Personal goals reviewed Strength training completed today  Goals Unmet:  Not Applicable  Comments:      6 Minute Walk    Row Name 06/21/16 1435 11/12/16 1701       6 Minute Walk   Phase Initial Discharge    Distance 1060 feet 1100 feet    Walk Time 6 minutes 6 minutes    # of Rest Breaks 0 0    MPH 2.01 2.08    METS 1.75 2.53    RPE 13 15    Perceived Dyspnea  2  -    VO2 Peak 6.12 7.82    Symptoms Yes (comment) Yes (comment)    Comments back pain back pain    Resting HR 75 bpm 75 bpm    Resting BP 132/82 124/72    Max Ex. HR 98 bpm 111 bpm    Max Ex. BP 136/64 172/66    2 Minute Post BP 130/66  -      Pt able to follow exercise prescription today without complaint.  Will continue to monitor for progression.    Dr. Emily Filbert is Medical Director for [place] and [facility].

## 2016-11-14 ENCOUNTER — Other Ambulatory Visit: Payer: Self-pay | Admitting: Internal Medicine

## 2016-11-15 ENCOUNTER — Encounter: Payer: Medicare Other | Admitting: *Deleted

## 2016-11-15 DIAGNOSIS — Z955 Presence of coronary angioplasty implant and graft: Secondary | ICD-10-CM | POA: Diagnosis not present

## 2016-11-15 DIAGNOSIS — Z9861 Coronary angioplasty status: Secondary | ICD-10-CM

## 2016-11-15 NOTE — Telephone Encounter (Signed)
Pt has not been seen by our office in a while she is being treated by cardiology. Is this something they should refill?

## 2016-11-15 NOTE — Progress Notes (Signed)
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: [id number] Date of Birth: 07/25/1936 Referring Provider:   [place] Row Cardiac Rehab from 06/21/2016 in [facility] Cardiac and Pulmonary Rehab  Referring Provider  Kathlyn Sacramento MD      Encounter Date: 11/15/2016  Check In:     Session Check In - 11/15/16 1627      Check-In   Location [facility]-Cardiac & Pulmonary Rehab   Staff Present Levell July RN Moises Blood, BS, ACSM CEP, Exercise Physiologist;Kristene Oletta Darter, [place], ACSM CEP, Exercise Physiologist;Other   Supervising physician immediately available to respond to emergencies See telemetry face sheet for immediately available ER MD   Medication changes reported     No   Fall or balance concerns reported    No   Warm-up and Cool-down Performed on first and last piece of equipment   Resistance Training Performed Yes   VAD Patient? No     Pain Assessment   Currently in Pain? No/denies   Multiple Pain Sites No           Exercise Prescription Changes - 11/15/16 1200      Response to Exercise   Blood Pressure (Exercise) 142/70   Blood Pressure (Exit) 110/64   Heart Rate (Admit) 80 bpm   Heart Rate (Exercise) 118 bpm   Heart Rate (Exit) 83 bpm   Rating of Perceived Exertion (Exercise) 12   Symptoms none   Duration Progress to 45 minutes of aerobic exercise without signs/symptoms of physical distress   Intensity THRR unchanged     Progression   Progression Continue to progress workloads to maintain intensity without signs/symptoms of physical distress.     Resistance Training   Training Prescription Yes   Weight 3   Reps 10-12     Interval Training   Interval Training No     NuStep   Level 6   Minutes 15   METs 2.8     Biostep-RELP   Level 3   Minutes 15   METs 2      Goals Met:  Independence with exercise equipment Exercise tolerated well No report of cardiac concerns or symptoms Strength training completed today  Goals Unmet:  Not  Applicable  Comments: Pt able to follow exercise prescription today without complaint.  Will continue to monitor for progression.    Dr. Emily Filbert is Medical Director for [place] and [facility].

## 2016-11-19 ENCOUNTER — Encounter: Payer: Medicare Other | Admitting: *Deleted

## 2016-11-19 DIAGNOSIS — Z955 Presence of coronary angioplasty implant and graft: Secondary | ICD-10-CM | POA: Diagnosis not present

## 2016-11-19 DIAGNOSIS — Z9861 Coronary angioplasty status: Secondary | ICD-10-CM

## 2016-11-19 NOTE — Progress Notes (Signed)
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: [id number] Date of Birth: 1936-04-27 Referring Provider:   Flowsheet Row Cardiac Rehab from 06/21/2016 in [facility] Cardiac and Pulmonary Rehab  Referring Provider  Kathlyn Sacramento MD      Encounter Date: 11/19/2016  Check In:     Session Check In - 11/19/16 [place]      Check-In   Staff Present Nyoka Cowden, RN, BSN, MA;Kamrin Spath, RN, BSN, CCRP;Carroll Enterkin, RN, BSN   Supervising physician immediately available to respond to emergencies See telemetry face sheet for immediately available ER MD   Medication changes reported     No   Fall or balance concerns reported    No   Warm-up and Cool-down Performed on first and last piece of equipment   Resistance Training Performed Yes   VAD Patient? No     Pain Assessment   Currently in Pain? No/denies         Goals Met:  Exercise tolerated well No report of cardiac concerns or symptoms Strength training completed today  Goals Unmet:  Not Applicable  Comments: Doing well with exercise prescription progression. Some increased SOB today, [name] reported that she is watching her weight and will report increasing weight or symptoms to her physician   Dr. Emily Filbert is Medical Director for [place] and [facility].

## 2016-11-22 DIAGNOSIS — Z9861 Coronary angioplasty status: Secondary | ICD-10-CM

## 2016-11-22 DIAGNOSIS — Z955 Presence of coronary angioplasty implant and graft: Secondary | ICD-10-CM | POA: Diagnosis not present

## 2016-11-22 NOTE — Progress Notes (Signed)
Daily Session Note  Patient Details  Name: [name], Dr. MRN: [id number] Date of Birth: [birth year] Referring Provider:   Flowsheet Row Cardiac Rehab from 06/21/2016 in [facility] Cardiac and Pulmonary Rehab  Referring Provider  Kathlyn Sacramento MD      Encounter Date: 11/22/2016  Check In:     Session Check In - 11/22/16 1638      Check-In   Location [facility]-Cardiac & Pulmonary Rehab   Staff Present Levell July RN Moises Blood, BS, ACSM CEP, Exercise Physiologist;[place] Oletta Darter, [place], ACSM CEP, Exercise Physiologist   Supervising physician immediately available to respond to emergencies See telemetry face sheet for immediately available ER MD   Medication changes reported     No   Fall or balance concerns reported    No   Warm-up and Cool-down Performed on first and last piece of equipment   Resistance Training Performed Yes   VAD Patient? No     Pain Assessment   Currently in Pain? No/denies         Goals Met:  Independence with exercise equipment Exercise tolerated well No report of cardiac concerns or symptoms Strength training completed today  Goals Unmet:  Not Applicable  Comments: Pt able to follow exercise prescription today without complaint.  Will continue to monitor for progression.    Dr. Emily Filbert is Medical Director for [place] and [facility].

## 2016-11-24 ENCOUNTER — Other Ambulatory Visit: Payer: Self-pay | Admitting: Internal Medicine

## 2016-11-26 ENCOUNTER — Encounter: Payer: Medicare Other | Admitting: *Deleted

## 2016-11-26 DIAGNOSIS — Z955 Presence of coronary angioplasty implant and graft: Secondary | ICD-10-CM | POA: Diagnosis not present

## 2016-11-26 DIAGNOSIS — Z9861 Coronary angioplasty status: Secondary | ICD-10-CM

## 2016-11-26 NOTE — Progress Notes (Signed)
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: [id number] Date of Birth: 1936-08-20 Referring Provider:   Flowsheet Row Cardiac Rehab from 06/21/2016 in [facility] Cardiac and Pulmonary Rehab  Referring Provider  Kathlyn Sacramento MD      Encounter Date: 11/26/2016  Check In:     Session Check In - 11/26/16 1809      Check-In   Staff Present Heath Lark, RN, BSN, CCRP;Carroll Enterkin, RN, Moises Blood, BS, ACSM CEP, Exercise Physiologist   Supervising physician immediately available to respond to emergencies See telemetry face sheet for immediately available ER MD   Medication changes reported     No   Fall or balance concerns reported    No   Warm-up and Cool-down Performed on first and last piece of equipment   Resistance Training Performed Yes   VAD Patient? No     Pain Assessment   Currently in Pain? No/denies         Goals Met:  Independence with exercise equipment No report of cardiac concerns or symptoms Strength training completed today  Goals Unmet:  Not Applicable  Comments: Doing well with exercise prescription progression.    Dr. Emily Filbert is Medical Director for [place] and [facility].

## 2016-11-26 NOTE — Patient Instructions (Signed)
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 Patient independent in their exercise.  Smoking History:  History  Smoking Status  . Former Smoker  Smokeless Tobacco  . Never Used    Comment: quit 44+ yrs ago    Diagnosis:  S/P PTCA (percutaneous transluminal coronary angioplasty)  Initial Exercise Prescription:     Initial Exercise Prescription - 06/21/16 1400      Date of Initial Exercise RX and Referring Provider   Date 06/21/16   Referring Provider Kathlyn Sacramento MD     Treadmill   MPH 1   Grade 0   Minutes 15   METs 1.77     NuStep   Level 2   Minutes 15   METs 1.8     Biostep-RELP   Level 2   Minutes 15   METs 2     Prescription Details   Frequency (times per week) 3   Duration Progress to 45 minutes of aerobic exercise without signs/symptoms of physical distress     Intensity   THRR 40-80% of Max Heartrate 102-129   Ratings of Perceived Exertion 11-15   Perceived Dyspnea 0-4     Progression   Progression Continue to progress workloads to maintain intensity without signs/symptoms of physical distress.     Resistance Training   Training Prescription Yes   Weight 2 lbs   Reps 10-12      Discharge Exercise Prescription (Final Exercise Prescription Changes):     Exercise Prescription Changes - 11/15/16 1200      Response to Exercise   Blood Pressure (Exercise) 142/70   Blood Pressure (Exit) 110/64   Heart Rate (Admit) 80 bpm   Heart Rate (Exercise) 118 bpm   Heart Rate (Exit) 83 bpm   Rating of Perceived Exertion (Exercise) 12   Symptoms none   Duration Progress to 45 minutes of aerobic exercise without signs/symptoms of physical distress   Intensity THRR unchanged     Progression   Progression Continue to progress workloads to maintain  intensity without signs/symptoms of physical distress.     Resistance Training   Training Prescription Yes   Weight 3   Reps 10-12     Interval Training   Interval Training No     NuStep   Level 6   Minutes 15   METs 2.8     Biostep-RELP   Level 3   Minutes 15   METs 2      Functional Capacity:     6 Minute Walk    Row Name 06/21/16 1435 11/12/16 1701       6 Minute Walk   Phase Initial Discharge    Distance 1060 feet 1100 feet    Walk Time 6 minutes 6 minutes    # of Rest Breaks 0 0    MPH 2.01 2.08    METS 1.75 2.53    RPE 13 15    Perceived Dyspnea  2  -    VO2 Peak 6.12 7.82    Symptoms Yes (comment) Yes (comment)    Comments back pain back pain    Resting HR 75 bpm 75 bpm    Resting BP 132/82 124/72    Max Ex. HR 98 bpm 111 bpm    Max Ex. BP 136/64 172/66  2 Minute Post BP 130/66  -       Quality of Life:     Quality of Life - 11/19/16 1833      Quality of Life Scores   Health/Function Pre 23.18 %   Health/Function Post 20.64 %   Health/Function % Change -10.96 %   Socioeconomic Pre 28.93 %   Socioeconomic Post 21 %   Socioeconomic % Change  -27.41 %   Psych/Spiritual Pre 29.14 %   Psych/Spiritual Post 20.71 %   Psych/Spiritual % Change -28.93 %   Family Pre 30 %   Family Post 20.75 %   Family % Change -30.83 %   GLOBAL Pre 26.59 %   GLOBAL Post 20.74 %   GLOBAL % Change -22 %      Personal Goals: Goals established at orientation with interventions provided to work toward goal.     Personal Goals and Risk Factors at Admission - 06/21/16 1440      Core Components/Risk Factors/Patient Goals on Admission   Increase Strength and Stamina Yes   Intervention Provide advice, education, support and counseling about physical activity/exercise needs.;Develop an individualized exercise prescription for aerobic and resistive training based on initial evaluation findings, risk stratification, comorbidities and participant's personal goals.    Expected Outcomes Achievement of increased cardiorespiratory fitness and enhanced flexibility, muscular endurance and strength shown through measurements of functional capacity and personal statement of participant.   Hypertension Yes   Intervention Provide education on lifestyle modifcations including regular physical activity/exercise, weight management, moderate sodium restriction and increased consumption of fresh fruit, vegetables, and low fat dairy, alcohol moderation, and smoking cessation.;Monitor prescription use compliance.   Expected Outcomes Short Term: Continued assessment and intervention until BP is < 140/45mm HG in hypertensive participants. < 130/52mm HG in hypertensive participants with diabetes, heart failure or chronic kidney disease.;Long Term: Maintenance of blood pressure at goal levels.   Lipids Yes   Intervention Provide education and support for participant on nutrition & aerobic/resistive exercise along with prescribed medications to achieve LDL 70mg , HDL >40mg .   Expected Outcomes Short Term: Participant states understanding of desired cholesterol values and is compliant with medications prescribed. Participant is following exercise prescription and nutrition guidelines.;Long Term: Cholesterol controlled with medications as prescribed, with individualized exercise RX and with personalized nutrition plan. Value goals: LDL < 70mg , HDL > 40 mg.       Personal Goals Discharge:     Goals and Risk Factor Review - 10/22/16 1657      Core Components/Risk Factors/Patient Goals Review   Review Still exercising and went to heart failure clinic and will cont to follow up with them.   Expected Outcomes Heart healthy lifestyle      Nutrition & Weight - Outcomes:     Pre Biometrics - 06/21/16 1443      Pre Biometrics   Height 5' 3.5" (1.613 m)   Weight 184 lb 8 oz (83.7 kg)   Waist Circumference 37.5 inches   Hip Circumference 47 inches   Waist to Hip Ratio 0.8 %   BMI  (Calculated) 32.2   Single Leg Stand 1.1 seconds         Post Biometrics - 11/12/16 1701       Post  Biometrics   Height 5' 3.5" (1.613 m)   Weight 185 lb 11.2 oz (84.2 kg)   Waist Circumference 37.5 inches   Hip Circumference 46.5 inches   Waist to Hip Ratio 0.81 %   BMI (Calculated) 32.4  Single Leg Stand 1.4 seconds      Nutrition:     Nutrition Therapy & Goals - 06/21/16 1455      Nutrition Therapy   Diet --  Whitnei prefers not to meet individually with the Cardiac Rehab Registered Dietician.      Nutrition Discharge:     Nutrition Assessments - 11/19/16 1833      Rate Your Plate Scores   Pre Score % 64 %   Post Score % 70.2 %   % Change 6.2 %      Education Questionnaire Score:     Knowledge Questionnaire Score - 11/19/16 1833      Knowledge Questionnaire Score   Pre Score 26   Post Score 27/28      Goals reviewed with patient; copy given to patient.

## 2016-11-26 NOTE — Telephone Encounter (Signed)
Has not had filled in long time can we do?

## 2016-11-27 NOTE — Telephone Encounter (Signed)
Please call and notify pt that we have not refilled this medication in a while.  Confirm need?

## 2016-11-27 NOTE — Telephone Encounter (Signed)
lmtrc

## 2016-11-28 ENCOUNTER — Other Ambulatory Visit: Payer: Self-pay | Admitting: Internal Medicine

## 2016-11-28 NOTE — Telephone Encounter (Signed)
Pt states that she does not use often but when she has flare up she takes for short time. She does need refill

## 2016-11-29 ENCOUNTER — Other Ambulatory Visit: Payer: Self-pay

## 2016-11-29 DIAGNOSIS — Z9861 Coronary angioplasty status: Secondary | ICD-10-CM

## 2016-11-29 DIAGNOSIS — Z955 Presence of coronary angioplasty implant and graft: Secondary | ICD-10-CM | POA: Diagnosis not present

## 2016-11-29 NOTE — Telephone Encounter (Signed)
Ok to refill x 1.  Which pharmacy does she want this sent to.  Ok to send in.

## 2016-11-29 NOTE — Telephone Encounter (Signed)
Pt last refill on Deseryl was on1/18/18. Last OV was on 09/06/16. Ok to refill?

## 2016-11-29 NOTE — Progress Notes (Signed)
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: [id number] Date of Birth: November 25, 1935 Referring Provider:   Flowsheet Row Cardiac Rehab from 06/21/2016 in [facility]  Referring Provider  Kathlyn Sacramento MD      Encounter Date: 11/29/2016  Check In:     Session Check In - 11/29/16 1716      Check-In   Location [facility]-Cardiac & Pulmonary Rehab   Staff Present Earlean Shawl, BS, ACSM CEP, Exercise Physiologist;Carroll Enterkin, RN, Vickki Hearing, BA, ACSM CEP, Exercise Physiologist   Supervising physician immediately available to respond to emergencies See telemetry face sheet for immediately available ER MD   Medication changes reported     No   Fall or balance concerns reported    No   Warm-up and Cool-down Performed on first and last piece of equipment   Resistance Training Performed Yes   VAD Patient? No     Pain Assessment   Currently in Pain? No/denies           Exercise Prescription Changes - 11/29/16 1300      Response to Exercise   Blood Pressure (Admit) 140/70   Blood Pressure (Exercise) 126/64   Blood Pressure (Exit) 124/82   Heart Rate (Admit) 66 bpm   Heart Rate (Exercise) 105 bpm   Heart Rate (Exit) 74 bpm   Rating of Perceived Exertion (Exercise) 12   Symptoms none   Duration Progress to 45 minutes of aerobic exercise without signs/symptoms of physical distress   Intensity THRR unchanged     Progression   Progression Continue to progress workloads to maintain intensity without signs/symptoms of physical distress.     Resistance Training   Training Prescription Yes   Weight 3   Reps 10-15     Interval Training   Interval Training No     NuStep   Level 6   Minutes 30   METs 2.6      Goals Met:  Independence with exercise equipment Exercise tolerated well No report of cardiac concerns or symptoms Strength training completed today  Goals Unmet:  Not Applicable  Comments:  Charlotte Wagner graduated today  from cardiac rehab with 36 sessions completed.  Details of the patient's exercise prescription and what she needs to do in order to continue the prescription and progress were discussed with patient.  Patient was given a copy of prescription and goals.  Patient verbalized understanding.  Charlotte Wagner plans to continue to exercise by using the facility at [facility].    Dr. Emily Filbert is Medical Director for [facility].

## 2016-11-29 NOTE — Progress Notes (Signed)
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 Patient independent in their exercise.  Smoking History:  History  Smoking Status  . Former Smoker  Smokeless Tobacco  . Never Used    Comment: quit 44+ yrs ago    Diagnosis:  S/P PTCA (percutaneous transluminal coronary angioplasty)  ADL UCSD:   Initial Exercise Prescription:     Initial Exercise Prescription - 06/21/16 1400      Date of Initial Exercise RX and Referring Provider   Date 06/21/16   Referring Provider Kathlyn Sacramento MD     Treadmill   MPH 1   Grade 0   Minutes 15   METs 1.77     NuStep   Level 2   Minutes 15   METs 1.8     Biostep-RELP   Level 2   Minutes 15   METs 2     Prescription Details   Frequency (times per week) 3   Duration Progress to 45 minutes of aerobic exercise without signs/symptoms of physical distress     Intensity   THRR 40-80% of Max Heartrate 102-129   Ratings of Perceived Exertion 11-15   Perceived Dyspnea 0-4     Progression   Progression Continue to progress workloads to maintain intensity without signs/symptoms of physical distress.     Resistance Training   Training Prescription Yes   Weight 2 lbs   Reps 10-12      Discharge Exercise Prescription (Final Exercise Prescription Changes):     Exercise Prescription Changes - 11/29/16 1300      Response to Exercise   Blood Pressure (Admit) 140/70   Blood Pressure (Exercise) 126/64   Blood Pressure (Exit) 124/82   Heart Rate (Admit) 66 bpm   Heart Rate (Exercise) 105 bpm   Heart Rate (Exit) 74 bpm   Rating of Perceived Exertion (Exercise) 12   Symptoms none   Duration Progress to 45  minutes of aerobic exercise without signs/symptoms of physical distress   Intensity THRR unchanged     Progression   Progression Continue to progress workloads to maintain intensity without signs/symptoms of physical distress.     Resistance Training   Training Prescription Yes   Weight 3   Reps 10-15     Interval Training   Interval Training No     NuStep   Level 6   Minutes 30   METs 2.6      Functional Capacity:     6 Minute Walk    Row Name 06/21/16 1435 11/12/16 1701       6 Minute Walk   Phase Initial Discharge    Distance 1060 feet 1100 feet    Walk Time 6 minutes 6 minutes    # of Rest Breaks 0 0    MPH 2.01 2.08    METS 1.75 2.53    RPE 13 15    Perceived Dyspnea  2  -    VO2 Peak 6.12 7.82    Symptoms Yes (comment) Yes (comment)    Comments back pain back pain    Resting HR 75 bpm 75 bpm    Resting BP 132/82 124/72    Max Ex.  HR 98 bpm 111 bpm    Max Ex. BP 136/64 172/66    2 Minute Post BP 130/66  -       Psychological, QOL, Others - Outcomes: PHQ 2/9: Depression screen [facility] 2/9 11/19/2016 10/19/2016 09/06/2016 06/21/2016 06/14/2016  Decreased Interest 1 0 0 1 0  Down, Depressed, Hopeless 0 1 0 1 0  PHQ - 2 Score 1 1 0 2 0  Altered sleeping 2 - - 2 -  Tired, decreased energy 1 - - 1 -  Change in appetite 0 - - 0 -  Feeling bad or failure about yourself  0 - - 0 -  Trouble concentrating 0 - - 0 -  Moving slowly or fidgety/restless 0 - - 0 -  Suicidal thoughts 0 - - 0 -  PHQ-9 Score 4 - - 5 -  Difficult doing work/chores Somewhat difficult - - Somewhat difficult -    Quality of Life:     Quality of Life - 11/19/16 1833      Quality of Life Scores   Health/Function Pre 23.18 %   Health/Function Post 20.64 %   Health/Function % Change -10.96 %   Socioeconomic Pre 28.93 %   Socioeconomic Post 21 %   Socioeconomic % Change  -27.41 %   Psych/Spiritual Pre 29.14 %   Psych/Spiritual Post 20.71 %   Psych/Spiritual % Change -28.93 %   Family  Pre 30 %   Family Post 20.75 %   Family % Change -30.83 %   GLOBAL Pre 26.59 %   GLOBAL Post 20.74 %   GLOBAL % Change -22 %      Personal Goals: Goals established at orientation with interventions provided to work toward goal.     Personal Goals and Risk Factors at Admission - 06/21/16 1440      Core Components/Risk Factors/Patient Goals on Admission   Increase Strength and Stamina Yes   Intervention Provide advice, education, support and counseling about physical activity/exercise needs.;Develop an individualized exercise prescription for aerobic and resistive training based on initial evaluation findings, risk stratification, comorbidities and participant's personal goals.   Expected Outcomes Achievement of increased cardiorespiratory fitness and enhanced flexibility, muscular endurance and strength shown through measurements of functional capacity and personal statement of participant.   Hypertension Yes   Intervention Provide education on lifestyle modifcations including regular physical activity/exercise, weight management, moderate sodium restriction and increased consumption of fresh fruit, vegetables, and low fat dairy, alcohol moderation, and smoking cessation.;Monitor prescription use compliance.   Expected Outcomes Short Term: Continued assessment and intervention until BP is < 140/70mm HG in hypertensive participants. < 130/75mm HG in hypertensive participants with diabetes, heart failure or chronic kidney disease.;Long Term: Maintenance of blood pressure at goal levels.   Lipids Yes   Intervention Provide education and support for participant on nutrition & aerobic/resistive exercise along with prescribed medications to achieve LDL 70mg , HDL >40mg .   Expected Outcomes Short Term: Participant states understanding of desired cholesterol values and is compliant with medications prescribed. Participant is following exercise prescription and nutrition guidelines.;Long Term:  Cholesterol controlled with medications as prescribed, with individualized exercise RX and with personalized nutrition plan. Value goals: LDL < 70mg , HDL > 40 mg.       Personal Goals Discharge:     Goals and Risk Factor Review    Row Name 07/12/16 1721 08/06/16 1409 09/27/16 1709 10/22/16 1657 11/28/16 1342     Core Components/Risk Factors/Patient Goals Review   Personal Goals Review Weight Management/Obesity;Hypertension;Lipids  -  Hypertension;Lipids  - Hypertension;Lipids   Review Chumy is trying to "behave" with watching her diet more closely.  Her blood pressures have been lower than normal.  She is tolerating her statins without problems. Elmira said her blood pressure when she first started on teh Brilinta was 100systolic but is better now at 120/70. Issys said she "displaced her iliac joints so didn't use the treadmill last week but did use the other machines without any problems. Anahlia reports that her stamina has gotten better since "she pushes her self alot more than if she was exercising on her own. " Bp has been good, cholesterol hasn't been checked recently.  Shalee is exercising at [facility] water exercise twice per week and the fitness room 3 times per week. Still exercising and went to heart failure clinic and will cont to follow up with them. Blood pressure has been very good. Still in Afib. Lipid blood work is followed up by her primary care MD. 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    Expected Outcomes Tennia will continue to come to exercise and education classes.  We will continue to monitor for progression. Cont heart healthy lifestyle Britni will continue to exercise and maintain her cholesterol and BP at levels her Dr feels are best for her.   Heart healthy lifestyle Will exercise at the gym at [facility] where she lives and continue her heart healthy lifestyle.      Nutrition & Weight - Outcomes:     Pre Biometrics - 06/21/16  1443      Pre Biometrics   Height 5' 3.5" (1.613 m)   Weight 184 lb 8 oz (83.7 kg)   Waist Circumference 37.5 inches   Hip Circumference 47 inches   Waist to Hip Ratio 0.8 %   BMI (Calculated) 32.2   Single Leg Stand 1.1 seconds         Post Biometrics - 11/12/16 1701       Post  Biometrics   Height 5' 3.5" (1.613 m)   Weight 185 lb 11.2 oz (84.2 kg)   Waist Circumference 37.5 inches   Hip Circumference 46.5 inches   Waist to Hip Ratio 0.81 %   BMI (Calculated) 32.4   Single Leg Stand 1.4 seconds      Nutrition:     Nutrition Therapy & Goals - 06/21/16 1455      Nutrition Therapy   Diet --  Kymira prefers not to meet individually with the Cardiac Rehab Registered Dietician.      Nutrition Discharge:     Nutrition Assessments - 11/19/16 1833      Rate Your Plate Scores   Pre Score % 64 %   Post Score % 70.2 %   % Change 6.2 %      Education Questionnaire Score:     Knowledge Questionnaire Score - 11/19/16 1833      Knowledge Questionnaire Score   Pre Score 26   Post Score 27/28      Goals reviewed with patient; copy given to patient.

## 2016-11-29 NOTE — Telephone Encounter (Signed)
Unable to send fax called in to pharmacy

## 2016-11-29 NOTE — Progress Notes (Addendum)
Cardiac Individual Treatment Plan  Patient Details  Name: Charlotte Wagner, Dr. MRN: [id number] Date of Birth: 04/03/1936 Referring Provider:   [facility] Cardiac Rehab from 06/21/2016 in [facility]  Referring Provider  Kathlyn Sacramento MD      Initial Encounter Date:  Flowsheet Row Cardiac Rehab from 06/21/2016 in [facility] Cardiac and Pulmonary Rehab  Date  06/21/16  Referring Provider  Kathlyn Sacramento MD      Visit Diagnosis: S/P PTCA (percutaneous transluminal coronary angioplasty)  Patient's Home Medications on Admission:  Current Outpatient Prescriptions:  .  acetaminophen (TYLENOL) 500 MG tablet, Take 1,000 mg by mouth 2 (two) times daily. , Disp: , Rfl:  .  amitriptyline (ELAVIL) 25 MG tablet, Take 1-2 tablets (25-50 mg total) by mouth at bedtime. (Patient taking differently: Take 25 mg by mouth at bedtime. ), Disp: 60 tablet, Rfl: 5 .  apixaban (ELIQUIS) 5 MG TABS tablet, Take 1 tablet (5 mg total) by mouth 2 (two) times daily., Disp: 60 tablet, Rfl: 0 .  b complex vitamins tablet, Take 1 tablet by mouth daily., Disp: , Rfl:  .  bisoprolol (ZEBETA) 5 MG tablet, Take 1 tablet (5 mg total) by mouth at bedtime. (Patient taking differently: Take 2.5 mg by mouth at bedtime. ), Disp: 30 tablet, Rfl: 3 .  Cholecalciferol (VITAMIN D3) 2000 UNITS TABS, Take 1 capsule by mouth daily. , Disp: , Rfl:  .  clopidogrel (PLAVIX) 75 MG tablet, Take 1 tablet (75 mg total) by mouth daily., Disp: 30 tablet, Rfl: 0 .  Coenzyme Q10 (COQ-10) 100 MG CAPS, Take 1 tablet by mouth daily., Disp: , Rfl:  .  ezetimibe (ZETIA) 10 MG tablet, take 1 tablet by mouth once daily, Disp: 30 tablet, Rfl: 7 .  Fluticasone-Salmeterol (ADVAIR) 250-50 MCG/DOSE AEPB, Inhale 1 puff into the lungs 2 (two) times daily as needed., Disp: , Rfl:  .  folic acid (FOLVITE) 1 MG tablet, take 1 tablet by mouth once daily, Disp: 30 tablet, Rfl: 5 .  furosemide (LASIX) 20 MG tablet, Take 0.5 tablets (10 mg total) by  mouth daily., Disp: 15 tablet, Rfl: 3 .  hydrocortisone 2.5 % cream, apply to VULVA twice a day AT 10AM AND 4 PM, Disp: 28 g, Rfl: 0 .  hydrocortisone 2.5 % cream, apply to VULVA twice a day AT 10AM AND 4 PM, Disp: 30 g, Rfl: 1 .  ipratropium (ATROVENT HFA) 17 MCG/ACT inhaler, Inhale 1 puff into the lungs every 4 (four) hours as needed for wheezing., Disp: , Rfl:  .  ketoconazole (NIZORAL) 2 % cream, Apply 1 application topically daily. (Patient taking differently: Apply 1 application topically daily as needed for irritation. ), Disp: 30 g, Rfl: 0 .  loratadine (CLARITIN) 10 MG tablet, Take 10 mg by mouth daily., Disp: , Rfl:  .  meclizine (ANTIVERT) 25 MG tablet, Take 25 mg by mouth 3 (three) times daily as needed for dizziness., Disp: , Rfl:  .  miconazole (MICOTIN) 2 % cream, Apply 1 application topically 2 (two) times daily as needed (irritation)., Disp: , Rfl:  .  Multiple Vitamins-Minerals (HAIR/SKIN/NAILS PO), Take 1 tablet by mouth daily., Disp: , Rfl:  .  nitroGLYCERIN (NITROSTAT) 0.4 MG SL tablet, Place 1 tablet (0.4 mg total) under the tongue every 5 (five) minutes as needed for chest pain. Place 1 tablet (0.4 mg total) under the tongue every 5 (five) minutes , 3 times as needed for chest pain., Disp: 15 tablet, Rfl: 0 .  Omega-3 Fatty Acids (FISH OIL PO), Take 2,000 mg by mouth daily., Disp: , Rfl:  .  pantoprazole (PROTONIX) 40 MG tablet, take 1 tablet by mouth twice a day, Disp: 60 tablet, Rfl: 5 .  Polyethylene Glycol 3350 (MIRALAX PO), Take 8.5 g by mouth at bedtime. , Disp: , Rfl:  .  ramipril (ALTACE) 10 MG capsule, take 1 capsule by mouth once daily, Disp: 30 capsule, Rfl: 3 .  spironolactone (ALDACTONE) 25 MG tablet, take 1 tablet by mouth every morning, Disp: 30 tablet, Rfl: 5 .  traMADol (ULTRAM) 50 MG tablet, Take 1 tablet (50 mg total) by mouth daily as needed. (Patient taking differently: Take 50 mg by mouth daily as needed for moderate pain. ), Disp: 20 tablet, Rfl: 0 .   traZODone (DESYREL) 150 MG tablet, take 1 and 2/3-2 tablets by mouth at bedtime as directed, Disp: 60 tablet, Rfl: 2 .  verapamil (CALAN-SR) 180 MG CR tablet, Take 1 tablet (180 mg total) by mouth at bedtime. (Patient taking differently: Take 180 mg by mouth 2 (two) times daily. ), Disp: 30 tablet, Rfl: 0 .  vitamin B-12 (CYANOCOBALAMIN) 1000 MCG tablet, Take 1,000 mcg by mouth daily., Disp: , Rfl:  .  vitamin C (ASCORBIC ACID) 500 MG tablet, Take 500 mg by mouth at bedtime., Disp: , Rfl:   Past Medical History: Past Medical History:  Diagnosis Date  . Allergy   . Arthritis    s/p bilateral knees and left hip replacement  . Asthma   . CAD (coronary artery disease)    a. 12/1996 s/p CABG x4 ([place]);  b. 2005 Pt reports stress test & cath, which revealed patent grafts.  . Cancer ([place])    melanoma right arm  . Carotid arterial disease ([place])    a. 04/2015 Carotid U/S: <50% bilat ICA stenosis.  . Chronic kidney disease   . Colon polyps    H/O  . Depression   . GERD (gastroesophageal reflux disease)    h/o hiatal hernia  . Headache    migraines in past  . Heart murmur    a. 04/2011 Echo: EF 55-60%, bilat atrial enlargement, mild to mod TR.  [name] History of chicken pox   . History of hiatal hernia   . Hx of migraines    rare now  . Hx: UTI (urinary tract infection)   . Hyperlipidemia    a. Statin intolerant -->on zetia.  . Hypertensive heart disease   . Lichen planus   . Melanoma ([place])   . Palpitations    a. rare PVC's and h/o SVT.  [name] PMR (polymyalgia rheumatica) ([facility])    h/o in setting of crestor usage.  [name] PSVT (paroxysmal supraventricular tachycardia) ([place])   . PUD (peptic ulcer disease)    remote history  . Raynaud's phenomenon   . Spinal stenosis   . Urine incontinence    H/O  . Vitamin D deficiency     Tobacco Use: History  Smoking Status  . Former Smoker  Smokeless Tobacco  . Never Used    Comment: quit 44+ yrs ago    Labs: Recent Review Flowsheet Data     Labs for ITP Cardiac and Pulmonary Rehab Latest Ref Rng & Units 06/15/2014 12/02/2014 03/21/2015 09/12/2015 03/26/2016   Cholestrol 0 - 200 mg/dL 244(H) 193 208(H) 204(H) 221(H)   LDLCALC 0 - 99 mg/dL 170(H) 120(H) 140(H) 137(H) 143(H)   LDLDIRECT mg/dL - - - - -   HDL >39.00 mg/dL 46.80 50.00  49.90 49.70 54.80   Trlycerides 0.0 - 149.0 mg/dL 137.0 114.0 92.0 86.0 119.0       Exercise Target Goals:    Exercise Program Goal: Individual exercise prescription set with THRR, safety & activity barriers. Participant demonstrates ability to understand and report RPE using BORG scale, to self-measure pulse accurately, and to acknowledge the importance of the exercise prescription.  Exercise Prescription Goal: Starting with aerobic activity 30 plus minutes a day, 3 days per week for initial exercise prescription. Provide home exercise prescription and guidelines that participant acknowledges understanding prior to discharge.  Activity Barriers & Risk Stratification:     Activity Barriers & Cardiac Risk Stratification - 06/21/16 1434      Activity Barriers & Cardiac Risk Stratification   Activity Barriers Arthritis;Joint Problems;Shortness of Breath;Back Problems;Balance Concerns;Assistive Device;Left Hip Replacement;Left Knee Replacement;Right Knee Replacement;Neck/Spine Problems;Deconditioning;Muscular Weakness;History of Falls   Cardiac Risk Stratification High      6 Minute Walk:     6 Minute Walk    Row Name 06/21/16 1435 11/12/16 1701       6 Minute Walk   Phase Initial Discharge    Distance 1060 feet 1100 feet    Walk Time 6 minutes 6 minutes    # of Rest Breaks 0 0    MPH 2.01 2.08    METS 1.75 2.53    RPE 13 15    Perceived Dyspnea  2  -    VO2 Peak 6.12 7.82    Symptoms Yes (comment) Yes (comment)    Comments back pain back pain    Resting HR 75 bpm 75 bpm    Resting BP 132/82 124/72    Max Ex. HR 98 bpm 111 bpm    Max Ex. BP 136/64 172/66    2 Minute Post BP 130/66   -       Initial Exercise Prescription:     Initial Exercise Prescription - 06/21/16 1400      Date of Initial Exercise RX and Referring Provider   Date 06/21/16   Referring Provider Kathlyn Sacramento MD     Treadmill   MPH 1   Grade 0   Minutes 15   METs 1.77     NuStep   Level 2   Minutes 15   METs 1.8     Biostep-RELP   Level 2   Minutes 15   METs 2     Prescription Details   Frequency (times per week) 3   Duration Progress to 45 minutes of aerobic exercise without signs/symptoms of physical distress     Intensity   THRR 40-80% of Max Heartrate 102-129   Ratings of Perceived Exertion 11-15   Perceived Dyspnea 0-4     Progression   Progression Continue to progress workloads to maintain intensity without signs/symptoms of physical distress.     Resistance Training   Training Prescription Yes   Weight 2 lbs   Reps 10-12      Perform Capillary Blood Glucose checks as needed.  Exercise Prescription Changes:     Exercise Prescription Changes    Row Name 06/21/16 1400 07/11/16 1400 07/12/16 1700 07/26/16 1200 08/08/16 1300     Exercise Review   Progression -  walk test results Yes Yes Yes Yes     Response to Exercise   Blood Pressure (Admit) 132/82 148/74 148/74 132/64 130/80   Blood Pressure (Exercise) 136/64 142/82 142/82 148/70  -   Blood Pressure (Exit) 130/66 116/60 116/60 124/66 124/70  Heart Rate (Admit) 75 bpm 50 bpm 50 bpm 68 bpm 80 bpm   Heart Rate (Exercise) 98 bpm 116 bpm 116 bpm 110 bpm 105 bpm   Heart Rate (Exit)  - 71 bpm 71 bpm 68 bpm 66 bpm   Oxygen Saturation (Admit) 97 %  -  -  -  -   Rating of Perceived Exertion (Exercise) _0 Perceived Dyspnea (Exercise) 2  -  -  -  -   Symptoms back pain  -  -  -  -   Comments  -  - Home Exercise Guidelines given 07/12/16  -  -   Duration  - Progress to 45 minutes of aerobic exercise without signs/symptoms of physical distress Progress to 45 minutes of aerobic exercise without  signs/symptoms of physical distress Progress to 45 minutes of aerobic exercise without signs/symptoms of physical distress Progress to 45 minutes of aerobic exercise without signs/symptoms of physical distress   Intensity  - THRR unchanged THRR unchanged THRR unchanged THRR unchanged     Progression   Progression  - Continue to progress workloads to maintain intensity without signs/symptoms of physical distress. Continue to progress workloads to maintain intensity without signs/symptoms of physical distress. Continue to progress workloads to maintain intensity without signs/symptoms of physical distress. Continue to progress workloads to maintain intensity without signs/symptoms of physical distress.   Average METs  - 2.14 2.14 2.9 2.53     Resistance Training   Training Prescription  - Yes Yes Yes Yes   Weight  - _1 Reps  - 10-12 10-12 10-12 10-12     Interval Training   Interval Training  - No No No No     Treadmill   MPH  - 1 1 1.9 2   Grade  - 0 0 0 0   Minutes  - _2 METs  - 1.77 1.77  - 2.53     NuStep   Level  - _3 -   Minutes  - _4 -   METs  - 2.5 2.5 2.9  -     Biostep-RELP   Level  -  -  -  - 3   Minutes  -  -  -  - 15     Home Exercise Plan   Plans to continue exercise at  -  - [employer] (comment)  walking and [employer]  -  -   Frequency  -  - Add 3 additional days to program exercise sessions.  -  -   Row Name 08/23/16 1200 09/06/16 1100 09/20/16 1100 10/05/16 1000 10/19/16 1200     Exercise Review   Progression Yes Yes  - Yes Yes     Response to Exercise   Blood Pressure (Admit) 138/78 138/78 126/64 132/64 118/64   Blood Pressure (Exercise) 144/70 144/70 144/72 128/64 126/84   Blood Pressure (Exit) 132/72 132/72 120/58 110/60 104/62   Heart Rate (Admit) 78 bpm 78 bpm 78 bpm 95 bpm 68 bpm   Heart Rate (Exercise) 90 bpm 90 bpm 81 bpm 103 bpm 108 bpm   Heart Rate (Exit) 65 bpm 65 bpm 64 bpm 94 bpm 76 bpm   Rating of  Perceived Exertion (Exercise) _5 Symptoms  -  -  - none none   Comments  -  -  - Home Exercise Guidelines  given 07/12/16  -   Duration Progress to 45 minutes of aerobic exercise without signs/symptoms of physical distress Progress to 45 minutes of aerobic exercise without signs/symptoms of physical distress Progress to 45 minutes of aerobic exercise without signs/symptoms of physical distress Progress to 45 minutes of aerobic exercise without signs/symptoms of physical distress Progress to 45 minutes of aerobic exercise without signs/symptoms of physical distress   Intensity _0      Progression   Progression Continue to progress workloads to maintain intensity without signs/symptoms of physical distress. Continue to progress workloads to maintain intensity without signs/symptoms of physical distress. Continue to progress workloads to maintain intensity without signs/symptoms of physical distress. Continue to progress workloads to maintain intensity without signs/symptoms of physical distress. Continue to progress workloads to maintain intensity without signs/symptoms of physical distress.   Average METs 3.6 3.6 2.55 2.54  -     Resistance Training   Training Prescription _1    Weight _2 Reps 10-12 10-12 10-12 10-12 10-12     Interval Training   Interval Training _3      Treadmill   MPH  -  -  - 2  -   Grade  -  -  - 0  -   Minutes  -  -  - 15  -   METs  -  -  - 2.53  -     NuStep   Level _4 Minutes _5 METs 3.2 3.2 3.1 3.1 2.5     Biostep-RELP   Level  -  - _6 Minutes  -  - _7 METs  -  - 2 3  -     Home Exercise Plan   Plans to continue exercise at  -  -  - [employer] (comment)  walking and [employer]  -   Frequency  -  -  - Add 3 additional days to program exercise sessions.  -   Row Name 11/01/16 1200 11/15/16 1200  11/29/16 1300         Response to Exercise   Blood Pressure (Admit)  -  - 140/70     Blood Pressure (Exercise) 132/70 142/70 126/64     Blood Pressure (Exit) 110/70 110/64 124/82     Heart Rate (Admit) 84 bpm 80 bpm 66 bpm     Heart Rate (Exercise) 101 bpm 118 bpm 105 bpm     Heart Rate (Exit) 87 bpm 83 bpm 74 bpm     Rating of Perceived Exertion (Exercise) _8 Symptoms none none none     Duration Progress to 50 minutes of aerobic without signs/symptoms of physical distress Progress to 45 minutes of aerobic exercise without signs/symptoms of physical distress Progress to 45 minutes of aerobic exercise without signs/symptoms of physical distress     Intensity THRR unchanged THRR unchanged THRR unchanged       Progression   Progression Continue to progress workloads to maintain intensity without signs/symptoms of physical distress. Continue to progress workloads to maintain intensity without signs/symptoms of physical distress. Continue to progress workloads to maintain intensity without signs/symptoms of physical distress.       Resistance Training   Training Prescription Yes Yes Yes     Weight 3  3 3     Reps 10-12 10-12 10-15       Interval Training   Interval Training  - No No       Treadmill   MPH 2  -  -     Grade 0  -  -     Minutes 15  -  -     METs 2.53  -  -       NuStep   Level  - 6 6     Minutes  - 15 30     METs  - 2.8 2.6       Biostep-RELP   Level 3 3  -     Minutes 15 15  -     METs 2 2  -        Exercise Comments:     Exercise Comments    Row Name 06/28/16 1812 07/11/16 1421 07/12/16 1723 07/26/16 1246 08/08/16 1314   Exercise Comments First full day of exercise!  Patient was oriented to gym and equipment including functions, settings, policies, and procedures.  Patient's individual exercise prescription and treatment plan were reviewed.  All starting workloads were established based on the results of the 6 minute walk test done at initial  orientation visit.  The plan for exercise progression was also introduced and progression will be customized based on patient's performance and goals. Shantoya is progressing well with exercise. Reviewed home exercise with pt today.  Pt plans to walk at home and go to class/gym at [facility] for exercise.  Reviewed THR, pulse, RPE, sign and symptoms, NTG use, and when to call 911 or MD.  Also discussed weather considerations and indoor options.  Pt voiced understanding. Karianna continues to progress well with exercise. Keyon is porgressing well with exercise.   [place] Name 08/23/16 1300 09/06/16 1118 09/13/16 1722 09/20/16 1114 10/05/16 1032   Exercise Comments Kynzlie is doing well with exercise during HT and also using the facility at [facility] on days she doen't attend HT. Sharla has not attended since 08/20/16. First day back - starting back easy with no treadmill. Brandye is just using the NS and Biostep since she has returned to [facility] after missing sessions. Damary has added the treadmill back into her rotation.  She has been out with a cold for the past week.  We will monitor her workloads and progress upon return.   [place] Name 10/19/16 1213 11/01/16 1245 11/15/16 1210 11/29/16 1327     Exercise Comments Zoey has had some medication changes and is staring back exercise so she has decreased workloads on some machines. Alden has tolerated exercise well since returning to HT. Belynda has progressed well and will graduate soon. Jaleesa plans to continue exercise at the [facility] facility upon graduation from [employer].       Discharge Exercise Prescription (Final Exercise Prescription Changes):     Exercise Prescription Changes - 11/29/16 1300      Response to Exercise   Blood Pressure (Admit) 140/70   Blood Pressure (Exercise) 126/64   Blood Pressure (Exit) 124/82   Heart Rate (Admit) 66 bpm   Heart Rate (Exercise) 105 bpm   Heart Rate (Exit) 74 bpm   Rating of Perceived Exertion (Exercise) 12    Symptoms none   Duration Progress to 45 minutes of aerobic exercise without signs/symptoms of physical distress   Intensity THRR unchanged     Progression   Progression Continue to progress workloads to maintain  intensity without signs/symptoms of physical distress.     Resistance Training   Training Prescription Yes   Weight 3   Reps 10-15     Interval Training   Interval Training No     NuStep   Level 6   Minutes 30   METs 2.6      Nutrition:  Target Goals: Understanding of nutrition guidelines, daily intake of sodium <1571m, cholesterol <2029m calories 30% from fat and 7% or less from saturated fats, daily to have 5 or more servings of fruits and vegetables.  Biometrics:     Pre Biometrics - 06/21/16 1443      Pre Biometrics   Height 5' 3.5" (1.613 m)   Weight 184 lb 8 oz (83.7 kg)   Waist Circumference 37.5 inches   Hip Circumference 47 inches   Waist to Hip Ratio 0.8 %   BMI (Calculated) 32.2   Single Leg Stand 1.1 seconds         Post Biometrics - 11/12/16 1701       Post  Biometrics   Height 5' 3.5" (1.613 m)   Weight 185 lb 11.2 oz (84.2 kg)   Waist Circumference 37.5 inches   Hip Circumference 46.5 inches   Waist to Hip Ratio 0.81 %   BMI (Calculated) 32.4   Single Leg Stand 1.4 seconds      Nutrition Therapy Plan and Nutrition Goals:     Nutrition Therapy & Goals - 06/21/16 1455      Nutrition Therapy   Diet --  AmSethrefers not to meet individually with the Cardiac Rehab Registered Dietician.      Nutrition Discharge: Rate Your Plate Scores:     Nutrition Assessments - 11/19/16 1833      Rate Your Plate Scores   Pre Score % 64 %   Post Score % 70.2 %   % Change 6.2 %      Nutrition Goals Re-Evaluation:     Nutrition Goals Re-Evaluation    Row Name 08/06/16 1406 10/22/16 1655 11/16/16 1212 11/28/16 1351       Personal Goal #1 Re-Evaluation   Personal Goal #1 Cont heart healthy diet. 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 "I know what to eat but always I haven't eaten correctly" Dr. 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        Psychosocial: Target Goals: Acknowledge presence or absence of depression, maximize coping skills, provide positive support system. Participant is able to verbalize types and ability to use techniques and skills needed for reducing stress and depression.  Initial Review & Psychosocial Screening:     Initial Psych Review & Screening - 06/21/16 1441      Initial Review   Current issues with History of Depression     Family Dynamics   Good Support System? Yes   Comments AmAlviaeports  that she has a family history of depression. Saraia said she started herself on antidepressants after she asked her husband to leave and she was raising 3 children alone plus private MD practice.     Screening Interventions   Interventions Encouraged to exercise      Quality of Life Scores:     Quality of Life - 11/19/16 1833      Quality of Life Scores   Health/Function Pre 23.18 %   Health/Function Post 20.64 %   Health/Function % Change -10.96 %   Socioeconomic Pre 28.93 %   Socioeconomic Post 21 %   Socioeconomic % Change  -27.41 %   Psych/Spiritual Pre 29.14 %   Psych/Spiritual Post 20.71 %   Psych/Spiritual % Change -28.93 %   Family Pre 30 %   Family Post 20.75 %   Family % Change -30.83 %   GLOBAL Pre 26.59 %   GLOBAL Post 20.74  %   GLOBAL % Change -22 %      PHQ-9: Recent Review Flowsheet Data    Depression screen [facility] 2/9 11/19/2016 10/19/2016 09/06/2016 06/21/2016 06/14/2016   Decreased Interest 1 0 0 1 0   Down, Depressed, Hopeless 0 1 0 1 0   PHQ - 2 Score 1 1 0 2 0   Altered sleeping 2 - - 2 -   Tired, decreased energy 1 - - 1 -   Change in appetite 0 - - 0 -   Feeling bad or failure about yourself  0 - - 0 -   Trouble concentrating 0 - - 0 -   Moving slowly or fidgety/restless 0 - - 0 -   Suicidal thoughts 0 - - 0 -   PHQ-9 Score 4 - - 5 -   Difficult doing work/chores Somewhat difficult  - - Somewhat difficult -      Psychosocial Evaluation and Intervention:     Psychosocial Evaluation - 07/02/16 1721      Psychosocial Evaluation & Interventions   Interventions Encouraged to exercise with the program and follow exercise prescription   Comments Counselor met with Ms. Melikian today for initial psychosocial evaluation.  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  Ms. Quakenbush lives in a retirement community and has a strong support system with a daughter who lives close by and active involvement in her local church.  Ms. Blumenthal has multiple health issues and reports she is sleeping better now with some medication adjustments.  She admits to a family history of depression and anxiety and she has been taking medications for her own mood issues for quite some time.  She reports her current mood is typically positive.  Ms. Bajorek states her primary stressors are her health currently which includes bouts of breathlessness; as well as experiencing a great deal of pain when she stands for long periods of time; which she attributes to her spinal stenosis which keeps her bent over and walking with a cane or walker most of the day.  Counselor will continue to follow with Ms.  Basquez while in this program.        Psychosocial Re-Evaluation:     Psychosocial Re-Evaluation    [place] Name 08/06/16 1412 08/23/16 1424 10/22/16 1658 11/05/16 1725 11/28/16 1344     Psychosocial Re-Evaluation   Interventions Encouraged to attend Cardiac  Rehabilitation for the exercise  -  -  -  -   Comments Meiling said it was stressful getting a notice from [facility] that they would not pay for what Medicare doesn't pay for. Bailey said she was glad to find out that Medicare does cover Cardiac Rehab.  Ilani called today and said she is sorry that she will miss Cardiac Rehab but  she will be out a little bit since she reports that MD wants to find out what is going on with her heart.  Dr. Estill Bamberg says she feels better and not as stressed since they figured out her breathlessness was due to one of her medicines that the doctor stopped.  Counselor follow up with Dr. Delene Loll today reporting feeling somewhat "frustrated" with her health and how one medication impacts another causing negative reactions.  She will see the Dr. tomorrow and hopes he will help her figure some of this out.  She is also recognizing the need to accept her current condition and not having unrealistic expectations currently - with the balance of not giving up on exercise and her part in this process.  Counselor offered supportive services and will continue to follow with her. Dr. Estill Bamberg knows she is in a fib all the time and wishes her heart rhythm will be ok. Dr. [name] lives at [facility] facility and will get support when she exercises in their gym.       Vocational Rehabilitation: Provide vocational rehab assistance to qualifying candidates.   Vocational Rehab Evaluation & Intervention:     Vocational Rehab - 06/21/16 1435      Initial Vocational Rehab Evaluation & Intervention   Assessment shows need for Vocational Rehabilitation No      Education: Education Goals: Education classes  will be provided on a weekly basis, covering required topics. Participant will state understanding/return demonstration of topics presented.  Learning Barriers/Preferences:     Learning Barriers/Preferences - 06/21/16 1434      Learning Barriers/Preferences   Learning Barriers None;Exercise Concerns   Learning Preferences Audio;Group Instruction;Written Material      Education Topics: General Nutrition Guidelines/Fats and Fiber: -Group instruction provided by verbal, written material, models and posters to present the general guidelines for heart healthy nutrition. Gives an explanation and review of dietary fats and fiber.   Controlling Sodium/Reading Food Labels: -Group verbal and written material supporting the discussion of sodium use in heart healthy nutrition. Review and explanation with models, verbal and written materials for utilization of the food label.   Exercise Physiology & Risk Factors: - Group verbal and written instruction with models to review the exercise physiology of the cardiovascular system and associated critical values. Details cardiovascular disease risk factors and the goals associated with each risk factor. Flowsheet Row Cardiac Rehab from 11/12/2016 in [facility] Cardiac and Pulmonary Rehab  Date  (P) 07/09/16  Educator  (P) [place]  Instruction Review Code  (P) 2- meets goals/outcomes      Aerobic Exercise & Resistance Training: - Gives group verbal and written discussion on the health impact of inactivity. On the components of aerobic and resistive training programs and the benefits of this training and how to safely progress through these programs. Flowsheet Row Cardiac Rehab from 11/12/2016 in [facility] Cardiac and Pulmonary Rehab  Date  (P) 07/16/16  Educator  (P) [place]  Instruction Review Code  (P) 2- meets goals/outcomes      Flexibility, Balance, General Exercise Guidelines: - Provides group verbal and  written instruction on the benefits of flexibility and  balance training programs. Provides general exercise guidelines with specific guidelines to those with heart or lung disease. Demonstration and skill practice provided.   Stress Management: - Provides group verbal and written instruction about the health risks of elevated stress, cause of high stress, and healthy ways to reduce stress.   Depression: - Provides group verbal and written instruction on the correlation between heart/lung disease and depressed mood, treatment options, and the stigmas associated with seeking treatment.   Anatomy & Physiology of the Heart: - Group verbal and written instruction and models provide basic cardiac anatomy and physiology, with the coronary electrical and arterial systems. Review of: AMI, Angina, Valve disease, Heart Failure, Cardiac Arrhythmia, Pacemakers, and the ICD. Flowsheet Row Cardiac Rehab from 11/12/2016 in [facility] Cardiac and Pulmonary Rehab  Date  (P) 07/23/16  Educator  (P) CE  Instruction Review Code  (P) 2- meets goals/outcomes      Cardiac Procedures: - Group verbal and written instruction and models to describe the testing methods done to diagnose heart disease. Reviews the outcomes of the test results. Describes the treatment choices: Medical Management, Angioplasty, or Coronary Bypass Surgery.   Cardiac Medications: - Group verbal and written instruction to review commonly prescribed medications for heart disease. Reviews the medication, class of the drug, and side effects. Includes the steps to properly store meds and maintain the prescription regimen.   Go Sex-Intimacy & Heart Disease, Get SMART - Goal Setting: - Group verbal and written instruction through game format to discuss heart disease and the return to sexual intimacy. Provides group verbal and written material to discuss and apply goal setting through the application of the S.M.A.R.T. Method.   Other Matters of the Heart: - Provides group verbal, written materials and  models to describe Heart Failure, Angina, Valve Disease, and Diabetes in the realm of heart disease. Includes description of the disease process and treatment options available to the cardiac patient. Flowsheet Row Cardiac Rehab from 11/12/2016 in [facility] Cardiac and Pulmonary Rehab  Date  (P) 07/23/16  Educator  (P) CE  Instruction Review Code  (P) 2- meets goals/outcomes      Exercise & Equipment Safety: - Individual verbal instruction and demonstration of equipment use and safety with use of the equipment. Flowsheet Row Cardiac Rehab from 11/12/2016 in [facility] Cardiac and Pulmonary Rehab  Date  (P) 06/21/16  Educator  (P) C. EnterkinRN  Instruction Review Code  (P) 1- partially meets, needs review/practice      Infection Prevention: - Provides verbal and written material to individual with discussion of infection control including proper hand washing and proper equipment cleaning during exercise session. Flowsheet Row Cardiac Rehab from 11/12/2016 in [facility] Cardiac and Pulmonary Rehab  Date  (P) 06/21/16  Educator  (P) Loletha Grayer EnterkinRN  Instruction Review Code  (P) 2- meets goals/outcomes      Falls Prevention: - Provides verbal and written material to individual with discussion of falls prevention and safety. Flowsheet Row Cardiac Rehab from 11/12/2016 in [facility] Cardiac and Pulmonary Rehab  Date  (P) 06/21/16  Educator  (P) C. EnterkinRN  Instruction Review Code  (P) 2- meets goals/outcomes      Diabetes: - Individual verbal and written instruction to review signs/symptoms of diabetes, desired ranges of glucose level fasting, after meals and with exercise. Advice that pre and post exercise glucose checks will be done for 3 sessions at entry of program.    Knowledge Questionnaire Score:  Knowledge Questionnaire Score - 11/19/16 1833      Knowledge Questionnaire Score   Pre Score 26   Post Score 27/28      Core Components/Risk Factors/Patient Goals at Admission:     Personal  Goals and Risk Factors at Admission - 06/21/16 1440      Core Components/Risk Factors/Patient Goals on Admission   Increase Strength and Stamina Yes   Intervention Provide advice, education, support and counseling about physical activity/exercise needs.;Develop an individualized exercise prescription for aerobic and resistive training based on initial evaluation findings, risk stratification, comorbidities and participant's personal goals.   Expected Outcomes Achievement of increased cardiorespiratory fitness and enhanced flexibility, muscular endurance and strength shown through measurements of functional capacity and personal statement of participant.   Hypertension Yes   Intervention Provide education on lifestyle modifcations including regular physical activity/exercise, weight management, moderate sodium restriction and increased consumption of fresh fruit, vegetables, and low fat dairy, alcohol moderation, and smoking cessation.;Monitor prescription use compliance.   Expected Outcomes Short Term: Continued assessment and intervention until BP is < 140/17m HG in hypertensive participants. < 130/810mHG in hypertensive participants with diabetes, heart failure or chronic kidney disease.;Long Term: Maintenance of blood pressure at goal levels.   Lipids Yes   Intervention Provide education and support for participant on nutrition & aerobic/resistive exercise along with prescribed medications to achieve LDL <7026mHDL >60m31m Expected Outcomes Short Term: Participant states understanding of desired cholesterol values and is compliant with medications prescribed. Participant is following exercise prescription and nutrition guidelines.;Long Term: Cholesterol controlled with medications as prescribed, with individualized exercise RX and with personalized nutrition plan. Value goals: LDL < 70mg34mL > 40 mg.      Core Components/Risk Factors/Patient Goals Review:      Goals and Risk Factor Review     Row Name 07/12/16 1721 08/06/16 1409 09/27/16 1709 10/22/16 1657 11/28/16 1342     Core Components/Risk Factors/Patient Goals Review   Personal Goals Review Weight Management/Obesity;Hypertension;Lipids  - Hypertension;Lipids  - Hypertension;Lipids   Review AmandEvelenerying to "behave" with watching her diet more closely.  Her blood pressures have been lower than normal.  She is tolerating her statins without problems. AmandApollonia her blood pressure when she first started on teh Brilinta was 100systolic but is better now at 120/70. AmandAyano she "displaced her iliac joints so didn't use the treadmill last week but did use the other machines without any problems. AmandNorikorts that her stamina has gotten better since "she pushes her self alot more than if she was exercising on her own. " Bp has been good, cholesterol hasn't been checked recently.  [facility] and the fitness room 3 times per week. Still exercising and went to heart failure clinic and will cont to follow up with them. Blood pressure has been very good. Still in Afib. Lipid blood work is followed up by her primary care MD. Dr. AmandGomez Cleverlyht a  nutrition class so she said she knows what to eat.    Expected Outcomes AmandBuna continue to come to exercise and education classes.  We will continue to monitor for progression. Cont heart healthy lifestyle AmandLenee continue to exercise and maintain her cholesterol and BP at levels her Dr feels are best for her.   Heart healthy lifestyle [facility] and continue her heart healthy lifestyle.  Core Components/Risk Factors/Patient Goals at Discharge (Final Review):      Goals and Risk Factor Review - 11/28/16 1342      Core Components/Risk Factors/Patient Goals Review   Personal Goals Review Hypertension;Lipids   Review Blood pressure has been very good. Still in Afib. Lipid blood  work is followed up by her primary care MD. 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    Expected Outcomes Will exercise at the gym at [facility] where she lives and continue her heart healthy lifestyle.      ITP Comments:     ITP Comments    Row Name 06/21/16 1438 06/21/16 1442 06/28/16 1812 07/18/16 0709 08/15/16 3382   ITP Comments Kadelyn walks with a cane. She uses a rolling walker at homes at times. Katora reports history that she tripped over something and fell and bruised her leg. 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 Deliyah also doesn't feel that she needs to meet individually with the Cardiac REhab REgisterd Dietician. "I know what to eat" Alanni reports that she has a family history of depression. Arlo said she started herself on antidepressants after she asked her husband to leave and she was raising 3 children alone plus private MD practice. First full day of exercise!  Patient was oriented to gym and equipment including functions, settings, policies, and procedures.  Patient's individual exercise prescription and treatment plan were reviewed.  All starting workloads were established based on the results of the 6 minute walk test done at initial orientation visit.  The plan for exercise progression was also introduced and progression will be customized based on patient's performance and goals. 30 day review. Continue with ITP unless changes noted by Medical Director at signature of review.  New to program 30 day review completed for Medical Director physician review and signature. Continue ITP unless changes made by physician.   [place] Name 08/16/16 1719 08/20/16 1746 08/23/16 1423 09/06/16 1119 09/12/16 0631   ITP Comments Patient tolerated exercise well. She did report that she has noticed some irregular heart beats with no pain or symptoms associated with this. She did show some sporadic irregular beats on her EKG  strip during exercise. She was advised to talk with her doctor and she stated that she would call him.  Dorota reports that her MD has ordered a heart monitor for her since she has been feeliing some irregular beats. Niala called today and said she will be out a little bit since she reports that MD wants to find out what is going on with her heart.  Annaliesa has not attended since 08/20/16. 30 day review completed for review by Dr Emily Filbert.  Continue with ITP unless changes noted by Dr Sabra Heck. Remains absent. Has a 30 day monitor on.   [place] Name 10/05/16 1031 10/09/16 5053 11/07/16 0646 11/16/16 1213 11/19/16 1836   ITP Comments Navy has been out with a cough and cold symptoms.  She did have a call in to the doctor's office today. 30 day review. Continue with ITP unless directed changes per Medical Director review. 30 day review. Continue with ITP unless directed changes per Medical Director review. 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  Some increased SOB today, Estill Bamberg  reported that she is watching her weight and will report increasing weight or symptoms to her physician      Comments: Completed 36/36 sessions

## 2016-12-05 ENCOUNTER — Other Ambulatory Visit: Payer: Self-pay | Admitting: Cardiology

## 2016-12-05 DIAGNOSIS — L578 Other skin changes due to chronic exposure to nonionizing radiation: Secondary | ICD-10-CM | POA: Diagnosis not present

## 2016-12-05 DIAGNOSIS — L814 Other melanin hyperpigmentation: Secondary | ICD-10-CM | POA: Diagnosis not present

## 2016-12-05 DIAGNOSIS — C44319 Basal cell carcinoma of skin of other parts of face: Secondary | ICD-10-CM | POA: Diagnosis not present

## 2016-12-05 DIAGNOSIS — Z85828 Personal history of other malignant neoplasm of skin: Secondary | ICD-10-CM | POA: Diagnosis not present

## 2016-12-05 DIAGNOSIS — L908 Other atrophic disorders of skin: Secondary | ICD-10-CM | POA: Diagnosis not present

## 2016-12-06 NOTE — Telephone Encounter (Signed)
Ok to refill 

## 2016-12-09 ENCOUNTER — Other Ambulatory Visit: Payer: Self-pay | Admitting: Internal Medicine

## 2016-12-18 ENCOUNTER — Ambulatory Visit (INDEPENDENT_AMBULATORY_CARE_PROVIDER_SITE_OTHER): Payer: Medicare Other | Admitting: Cardiology

## 2016-12-18 ENCOUNTER — Encounter: Payer: Self-pay | Admitting: Cardiology

## 2016-12-18 VITALS — BP 114/72 | HR 65 | Ht 63.0 in | Wt 184.5 lb

## 2016-12-18 DIAGNOSIS — I481 Persistent atrial fibrillation: Secondary | ICD-10-CM | POA: Diagnosis not present

## 2016-12-18 DIAGNOSIS — E784 Other hyperlipidemia: Secondary | ICD-10-CM | POA: Diagnosis not present

## 2016-12-18 DIAGNOSIS — I2510 Atherosclerotic heart disease of native coronary artery without angina pectoris: Secondary | ICD-10-CM | POA: Diagnosis not present

## 2016-12-18 DIAGNOSIS — I10 Essential (primary) hypertension: Secondary | ICD-10-CM

## 2016-12-18 DIAGNOSIS — I4891 Unspecified atrial fibrillation: Secondary | ICD-10-CM | POA: Diagnosis not present

## 2016-12-18 DIAGNOSIS — R0602 Shortness of breath: Secondary | ICD-10-CM

## 2016-12-18 DIAGNOSIS — I4819 Other persistent atrial fibrillation: Secondary | ICD-10-CM

## 2016-12-18 DIAGNOSIS — E7849 Other hyperlipidemia: Secondary | ICD-10-CM

## 2016-12-18 NOTE — Progress Notes (Signed)
Cardiology Office Note   Date:  12/18/2016   ID:  [name], Dr., DOB 08/27/1936, MRN [id number]  Referring Doctor:  [name], MD   Cardiologist:   Wende Bushy, MD   Reason for consultation:  Chief Complaint  Patient presents with  . other    F/u. Pt c/o sob. Reviewed meds with pt verbally.     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 No chest pains. She reports that she's not back to her usual energy level. Her shortness of breath is not as severe as when she was on Brillinta.  Patient denies PND, orthopnea. Edema overall has been under control. she is only taking Lasix as needed.   ROS:  Please see the history of present illness. Aside from mentioned under HPI, all other systems are reviewed and negative.      Past Medical History:  Diagnosis Date  . Allergy   . Arthritis    s/p bilateral knees and left hip replacement  . Asthma   . CAD (coronary artery disease)    a. 12/1996 s/p CABG x4 ([place]);  b. 2005 Pt reports stress test & cath, which revealed patent grafts.  . Cancer ([place])    melanoma right arm  . Carotid arterial disease ([place])    a. 04/2015 Carotid U/S: <50% bilat ICA stenosis.  . Chronic kidney disease   . Colon polyps    H/O  . Depression   . GERD (gastroesophageal reflux disease)    h/o hiatal hernia  . Headache    migraines in past  . Heart murmur    a. 04/2011 Echo: EF 55-60%, bilat atrial enlargement, mild to mod TR.  [name] History of chicken pox   . History of hiatal hernia   . Hx of migraines    rare now  . Hx: UTI (urinary tract infection)   . Hyperlipidemia    a. Statin intolerant -->on zetia.  . Hypertensive heart disease   . Lichen planus   . Melanoma ([place])   . Palpitations    a. rare PVC's and h/o SVT.  [name] PMR (polymyalgia rheumatica) ([facility])    h/o in setting of crestor usage.    [name] PSVT (paroxysmal supraventricular tachycardia) ([place])   . PUD (peptic ulcer disease)    remote history  . Raynaud's phenomenon   . Spinal stenosis   . Urine incontinence    H/O  . Vitamin D deficiency     Past Surgical History:  Procedure Laterality Date  . ADENOIDECTOMY     age 8  . BACK SURGERY     L3-L5  . BILATERAL CARPAL TUNNEL RELEASE    . BREAST BIOPSY Right    bx x 3 neg  . BREAST SURGERY Right    biopsy x 3 (all benign)  . CARDIAC CATHETERIZATION N/A 06/01/2016   Procedure: LEFT HEART CATH AND CORS/GRAFTS ANGIOGRAPHY;  Surgeon: Wellington Hampshire, MD;  Location: [facility];  Service: Cardiovascular;  Laterality: N/A;  . CARDIAC CATHETERIZATION N/A 06/01/2016   Procedure: Coronary Stent Intervention;  Surgeon: Wellington Hampshire, MD;  Location: [facility];  Service: Cardiovascular;  Laterality: N/A;  . CATARACT EXTRACTION W/PHACO Right 01/04/2016   Procedure: CATARACT EXTRACTION PHACO AND INTRAOCULAR LENS PLACEMENT (IOC);  Surgeon: Leandrew Koyanagi, MD;  Location: [facility];  Service:  Ophthalmology;  Laterality: Right;  MALYUGIN  . CATARACT EXTRACTION W/PHACO Left 01/25/2016   Procedure: CATARACT EXTRACTION PHACO AND INTRAOCULAR LENS PLACEMENT ([place]) left eye;  Surgeon: Leandrew Koyanagi, MD;  Location: [facility];  Service: Ophthalmology;  Laterality: Left;  MALYUGIN SHUGARCAINE  . CHOLECYSTECTOMY  90's  . COLONOSCOPY WITH PROPOFOL N/A 05/03/2015   Procedure: COLONOSCOPY WITH PROPOFOL;  Surgeon: Lollie Sails, MD;  Location[facility];  Service: Endoscopy;  Laterality: N/A;  . CORONARY ARTERY BYPASS GRAFT  98   4 vessel  . ESOPHAGOGASTRODUODENOSCOPY N/A 03/22/2015   Procedure: ESOPHAGOGASTRODUODENOSCOPY (EGD);  Surgeon: Lollie Sails, MD;  Location: [facility]COPY;  Service: Endoscopy;  Laterality: N/A;  . JOINT REPLACEMENT     BILATERAL KNEE REPLACEMENTS  . KNEE ARTHROSCOPY W/ OATS PROCEDURE     Lt knee (9/01), Rt knee  (3/11), Lt hip (5/10)  . TOTAL HIP ARTHROPLASTY       reports that she has quit smoking. She has never used smokeless tobacco. She reports that she does not drink alcohol or use drugs.   family history includes Alcohol abuse in her father; Depression in her daughter, father, maternal grandmother, and son; Heart disease in her father; Hypertension in her maternal grandfather and paternal grandfather; Lung cancer in her sister; Stroke in her maternal grandmother; Thyroid disease in her daughter and mother.   Outpatient Medications Prior to Visit  Medication Sig Dispense Refill  . acetaminophen (TYLENOL) 500 MG tablet Take 1,000 mg by mouth 2 (two) times daily.     [name] amitriptyline (ELAVIL) 25 MG tablet Take 1-2 tablets (25-50 mg total) by mouth at bedtime. (Patient taking differently: Take 25 mg by mouth at bedtime. ) 60 tablet 5  . b complex vitamins tablet Take 1 tablet by mouth daily.    . bisoprolol (ZEBETA) 5 MG tablet Take 1 tablet (5 mg total) by mouth at bedtime. (Patient taking differently: Take 2.5 mg by mouth at bedtime. ) 30 tablet 3  . Cholecalciferol (VITAMIN D3) 2000 UNITS TABS Take 1 capsule by mouth daily.     . clopidogrel (PLAVIX) 75 MG tablet take 1 tablet by mouth once daily 30 tablet 0  . Coenzyme Q10 (COQ-10) 100 MG CAPS Take 1 tablet by mouth daily.    [name] ELIQUIS 5 MG TABS tablet take 1 tablet by mouth twice a day 60 tablet 0  . ezetimibe (ZETIA) 10 MG tablet take 1 tablet by mouth once daily 30 tablet 7  . Fluticasone-Salmeterol (ADVAIR) 250-50 MCG/DOSE AEPB Inhale 1 puff into the lungs 2 (two) times daily as needed.    . folic acid (FOLVITE) 1 MG tablet take 1 tablet by mouth once daily 30 tablet 5  . furosemide (LASIX) 20 MG tablet Take 0.5 tablets (10 mg total) by mouth daily. 15 tablet 3  . hydrocortisone 2.5 % cream apply to VULVA twice a day AT 10AM AND 4 PM 28 g 0  . ipratropium (ATROVENT HFA) 17 MCG/ACT inhaler Inhale 1 puff into the lungs every 4 (four) hours as  needed for wheezing.    [name] ketoconazole (NIZORAL) 2 % cream Apply 1 application topically daily. (Patient taking differently: Apply 1 application topically daily as needed for irritation. ) 30 g 0  . loratadine (CLARITIN) 10 MG tablet Take 10 mg by mouth daily.    . meclizine (ANTIVERT) 25 MG tablet Take 25 mg by mouth 3 (three) times daily as needed for dizziness.    . miconazole (MICOTIN) 2 % cream Apply  1 application topically 2 (two) times daily as needed (irritation).    . Multiple Vitamins-Minerals (HAIR/SKIN/NAILS PO) Take 1 tablet by mouth daily.    . nitroGLYCERIN (NITROSTAT) 0.4 MG SL tablet Place 1 tablet (0.4 mg total) under the tongue every 5 (five) minutes as needed for chest pain. Place 1 tablet (0.4 mg total) under the tongue every 5 (five) minutes , 3 times as needed for chest pain. 15 tablet 0  . Omega-3 Fatty Acids (FISH OIL PO) Take 2,000 mg by mouth daily.    . pantoprazole (PROTONIX) 40 MG tablet take 1 tablet by mouth twice a day 60 tablet 5  . Polyethylene Glycol 3350 (MIRALAX PO) Take 8.5 g by mouth at bedtime.     . ramipril (ALTACE) 10 MG capsule take 1 capsule by mouth once daily 30 capsule 3  . spironolactone (ALDACTONE) 25 MG tablet take 1 tablet by mouth every morning 30 tablet 5  . traMADol (ULTRAM) 50 MG tablet Take 1 tablet (50 mg total) by mouth daily as needed. (Patient taking differently: Take 50 mg by mouth daily as needed for moderate pain. ) 20 tablet 0  . traZODone (DESYREL) 150 MG tablet take 1 and 2/3-2 tablets by mouth at bedtime as directed 60 tablet 2  . verapamil (CALAN-SR) 180 MG CR tablet Take 1 tablet (180 mg total) by mouth at bedtime. (Patient taking differently: Take 180 mg by mouth 2 (two) times daily. ) 30 tablet 0  . vitamin B-12 (CYANOCOBALAMIN) 1000 MCG tablet Take 1,000 mcg by mouth daily.    . vitamin C (ASCORBIC ACID) 500 MG tablet Take 500 mg by mouth at bedtime.    . hydrocortisone 2.5 % cream apply to VULVA twice a day AT 10AM AND 4 PM  (Patient not taking: Reported on 12/18/2016) 30 g 1   No facility-administered medications prior to visit.      Allergies: Beta adrenergic blockers; Albuterol; Aspirin; Penicillins; Statins; Sulfa antibiotics; Tetracycline; and Tetracyclines & related    PHYSICAL EXAM: VS:  BP 114/72 (BP Location: Left Arm, Patient Position: Sitting, Cuff Size: Normal)   Pulse 65   Ht 5\' 3"  (1.6 m)   Wt 184 lb 8 oz (83.7 kg)   BMI 32.68 kg/m  , Body mass index is 32.68 kg/m. Wt Readings from Last 3 Encounters:  12/18/16 184 lb 8 oz (83.7 kg)  11/12/16 185 lb 11.2 oz (84.2 kg)  11/06/16 187 lb 4 oz (84.9 kg)    GENERAL:  well developed, well nourished, obese, not in acute distress HEENT: normocephalic, pink conjunctivae, anicteric sclerae, no xanthelasma, normal dentition, oropharynx clear NECK:  no neck vein engorgement, JVP normal, no hepatojugular reflux, carotid upstroke brisk and symmetric, no bruit, no thyromegaly, no lymphadenopathy LUNGS:  good respiratory effort, clear to auscultation bilaterally CV:  PMI not displaced, no thrills, no lifts, S2 within normal limits, no palpable S3 or S4, no murmurs, no rubs, no gallops ABD:  Soft, nontender, nondistended, normoactive bowel sounds, no abdominal aortic bruit, no hepatomegaly, no splenomegaly MS: nontender back, no kyphosis, no scoliosis, no joint deformities EXT:  2+ DP/PT pulses, no edema, no varicosities, no cyanosis, no clubbing SKIN: warm, nondiaphoretic, normal turgor, no ulcers NEUROPSYCH: alert, oriented to person, place, and time, sensory/motor grossly intact, normal mood, appropriate affect     Recent Labs: 10/08/2016: ALT 22; B Natriuretic Peptide 566.0 10/09/2016: Magnesium 1.9 10/10/2016: Hemoglobin 12.7; Platelets 265 10/19/2016: BUN 24; Creatinine, Ser 0.87; Potassium 4.4; Sodium 135   Lipid Panel  Component Value Date/Time   CHOL 221 (H) 03/26/2016 0932   TRIG 119.0 03/26/2016 0932   HDL 54.80 03/26/2016 0932   CHOLHDL 4  03/26/2016 0932   VLDL 23.8 03/26/2016 0932   LDLCALC 143 (H) 03/26/2016 0932   LDLDIRECT 126.7 07/03/2013 0937     Other studies Reviewed:  EKG:  The ekg from 06/12/2016 was personally reviewed by me and it revealed sinus rhythm, 67 BPM. Low-voltage QRS.  EKG from 08/23/2016 was personally reviewed by me and it showed sinus rhythm, 70 BPM, frequent PVCs and bigeminy. Nonspecific ST-T wave changes.  EKG from 12/18/2016 was personally reviewed by me and it showed atrial fibrillation, 65 BPM.  Additional studies/ records that were reviewed personally reviewed by me today include:   Left heart cath 06/01/2016:  Prox LAD lesion, 100 %stenosed.  Ost Cx to Prox Cx lesion, 50 %stenosed.  Ost LM to LM lesion, 70 %stenosed.  Mid RCA lesion, 100 %stenosed.  Prox RCA lesion, 60 %stenosed.  SVG graft was visualized by angiography and is normal in caliber.  Post Atrio lesion, 60 %stenosed.  SVG graft was visualized by angiography and is normal in caliber.  The graft exhibits mild .  The flow in the graft is reversed.  Origin lesion, 30 %stenosed.  SVG graft was visualized by angiography.  Prox Graft lesion, 30 %stenosed.  LIMA graft was visualized by angiography and is normal in caliber and anatomically normal.  The left ventricular systolic function is normal.  LV end diastolic pressure is mildly elevated.  The left ventricular ejection fraction is 55-65% by visual estimate.  A STENT XIENCE ALPINE RX 3.0X15 drug eluting stent was successfully placed, and does not overlap previously placed stent.  Prox Graft lesion, 90 %stenosed.  Post intervention, there is a 0% residual stenosis.   1. Severe underlying three-vessel coronary artery disease with patent grafts including LIMA to LAD, SVG to diagonal, SVG to left circumflex and SVG to RCA. Severe 90% stenosis and SVG to RCA is likely the culprit for unstable angina.  2. Normal LV systolic function and mildly elevated left  ventricular end-diastolic pressure.  3. Successful drug-eluting stent placement to the SVG to RCA using a distal protection device.  Recommendations: The patient is severely intolerant to aspirin and she reports inability to take the medication. Thus, I elected to treat her with Brilinta monotherapy. Continue aggressive treatment for coronary artery disease.  Carotid u/s 04/25/2015, ordered by PCP: Less than 50% stenosis bilateral ICA Severe left ECA disease  Echo 06/19/2016: Left ventricle: The cavity size was normal. Systolic function was   normal. The estimated ejection fraction was in the range of 60%   to 65%. Wall motion was normal; there were no regional wall   motion abnormalities. Left ventricular diastolic function   parameters were normal. - Mitral valve: There was mild regurgitation. - Left atrium: The atrium was at the upper limits of normal in   size. - Right ventricle: Systolic function was normal. - Pulmonary arteries: Systolic pressure was mildly elevated. PA   peak pressure: 40 mm Hg (S).  Nuclear stress is 08/29/2016:  Pharmacological myocardial perfusion imaging study with no significant  ischemia Normal wall motion, EF estimated at 54% No EKG changes concerning for ischemia at peak stress or in recovery. Low risk scan  Holter 08/28/2016: Overall rhythm was sinus. Heart rate ranged from 43-82 bpm, average of 53 BPM.  Supraventricular ectopy: 61 isolated PACs. One 4 beat run of SVT, likely atrial tachycardia, 110  BPM.  Ventricular ectopy was 15% of the total number of beats: 12,062 isolated PVCs. 54 ventricular couplets. 6165 ventricular bigeminal cycles.  Echo 10/09/2016: Left ventricle: The cavity size was normal. Systolic function was   normal. The estimated ejection fraction was in the range of 60%   to 65%. Wall motion was normal; there were no regional wall   motion abnormalities. The study is not technically sufficient to   allow evaluation of  LV diastolic function. - Mitral valve: There was mild regurgitation. - Left atrium: The atrium was moderately dilated. - Right ventricle: The cavity size was mildly dilated. Wall   thickness was normal. Systolic function was mildly reduced. - Pulmonary arteries: Systolic pressure was mild to moderately   elevated. PA peak pressure: 49 mm Hg (S).  Impressions:  - Rhythm is atrial fibrillation.  ASSESSMENT AND PLAN: Fatigue/SOB Atiral fibrillation, rate controlled, Persistent Patient still in A. fib with controlled ventricular response. Continue Eliquis 5mg  bid. We are wondering whether her symptoms of lack of energy/fatigue, and shortness of breath are related to her current rhythm, or deconditioning, or multifactorial. No evidence of decomp CHF on exam. Wt is controlled. BP controlled. EF preserved. We discussed potentially doing a cardioversion since she has been on oral anticoagulation since January of this year. There is concern for uncovering AV conduction disease - she is very sensitive to BB (went into 1st deg avb on uptitration of zebeta). CheckTSH and free T4 Rec opinion from EP.   CAD From last office visit 11/06/2016, and CAD s/p CABG x 4 in 1998 in [place], [place].  Last cardiac eval took place in 2005 with stress testing and cath, apparently revealing 4/4 patent grafts. S/P LHCath and DES to SVG to RCA 06/01/2016. Other grafts patent Cont ?blocker - she is very sensitive to this. She had tried bid dosing and developed 1st degree avb. She is tolerating current dose of Zebeta 2.5mg  qd.  She did not tolerate Tenormin and metoprolol in the past - lowered blood pressure too much. Continue ACE inhibitor Resume home dose of zetia (statin intolerant).  She is allergic to ASA  - significant GI side effects. Developed significant bruising to Plavix.  Continue Plavix. We'll aim for 1 year duration post cath and drug-eluting stent placement.  Continue medical therapy. Continue  cardiac rehabilitation.   Hypertension BP is well controlled. Continue monitoring BP. Continue current medical therapy and lifestyle changes.  Hyperlipidemia Recommendations the same as per last office note 11/06/2016: Statin intolerant. Continue zetia. Patient developed significant muscle weakness and intolerance to rosuvastatin, atorvastatin, simvastatin, and pravastatin.  carotid artery disease  Medical therapy, followed by PCP  Current medicines are reviewed at length with the patient today.  The patient does not have concerns regarding medicines.  Labs/ tests ordered today include:  Orders Placed This Encounter  Procedures  . TSH  . T4, free  . Ambulatory referral to Cardiac Electrophysiology  . EKG 12-Lead    I had a lengthy and detailed discussion with the patient regarding diagnoses, prognosis, diagnostic options, treatment options , and side effects of medications.   I counseled the patient on importance of lifestyle modification including heart healthy diet, regular physical activity .   Disposition:   FU with undersigned p EP visit     Signed, Wende Bushy, MD  12/18/2016 3:50 PM    [place]  This note was generated in part with voice recognition software and I apologize for any typographical errors that were  not detected and corrected.

## 2016-12-18 NOTE — Patient Instructions (Signed)
Follow-Up: You have been referred to Dr. Caryl Comes for discussion about cardioversion.  Your physician recommends that you schedule a follow-up appointment with Dr. Yvone Neu after seeing Dr. Caryl Comes  It was a pleasure seeing you today here in the office. Please do not hesitate to give [place] a call back if you have any further questions. [place], BSN

## 2016-12-19 LAB — TSH: TSH: 1.33 u[IU]/mL (ref 0.450–4.500)

## 2016-12-19 LAB — T4, FREE: Free T4: 1.61 ng/dL (ref 0.82–1.77)

## 2017-01-03 ENCOUNTER — Other Ambulatory Visit: Payer: Self-pay | Admitting: Cardiology

## 2017-01-07 ENCOUNTER — Ambulatory Visit (INDEPENDENT_AMBULATORY_CARE_PROVIDER_SITE_OTHER): Payer: Medicare Other | Admitting: Internal Medicine

## 2017-01-07 ENCOUNTER — Encounter: Payer: Self-pay | Admitting: Internal Medicine

## 2017-01-07 VITALS — BP 130/76 | HR 76 | Temp 98.6°F | Resp 16 | Wt 186.0 lb

## 2017-01-07 DIAGNOSIS — R0600 Dyspnea, unspecified: Secondary | ICD-10-CM | POA: Diagnosis not present

## 2017-01-07 DIAGNOSIS — K2100 Gastro-esophageal reflux disease with esophagitis, without bleeding: Secondary | ICD-10-CM

## 2017-01-07 DIAGNOSIS — I5032 Chronic diastolic (congestive) heart failure: Secondary | ICD-10-CM

## 2017-01-07 DIAGNOSIS — E7849 Other hyperlipidemia: Secondary | ICD-10-CM

## 2017-01-07 DIAGNOSIS — I4819 Other persistent atrial fibrillation: Secondary | ICD-10-CM

## 2017-01-07 DIAGNOSIS — I10 Essential (primary) hypertension: Secondary | ICD-10-CM

## 2017-01-07 DIAGNOSIS — I2510 Atherosclerotic heart disease of native coronary artery without angina pectoris: Secondary | ICD-10-CM

## 2017-01-07 DIAGNOSIS — I481 Persistent atrial fibrillation: Secondary | ICD-10-CM | POA: Diagnosis not present

## 2017-01-07 DIAGNOSIS — E7800 Pure hypercholesterolemia, unspecified: Secondary | ICD-10-CM | POA: Diagnosis not present

## 2017-01-07 DIAGNOSIS — M4800 Spinal stenosis, site unspecified: Secondary | ICD-10-CM | POA: Diagnosis not present

## 2017-01-07 DIAGNOSIS — E784 Other hyperlipidemia: Secondary | ICD-10-CM

## 2017-01-07 LAB — BASIC METABOLIC PANEL
BUN: 23 mg/dL (ref 6–23)
CO2: 28 meq/L (ref 19–32)
Calcium: 9.6 mg/dL (ref 8.4–10.5)
Chloride: 103 mEq/L (ref 96–112)
Creatinine, Ser: 0.95 mg/dL (ref 0.40–1.20)
GFR: 60.35 mL/min (ref >60.00)
GLUCOSE: 94 mg/dL (ref 70–99)
POTASSIUM: 4.5 meq/L (ref 3.5–5.1)
SODIUM: 137 meq/L (ref 135–145)

## 2017-01-07 LAB — LIPID PANEL
Cholesterol: 195 mg/dL (ref 0–200)
HDL: 57.2 mg/dL (ref >39.00)
LDL CALC: 124 mg/dL — AB (ref 0–99)
NONHDL: 138.28
Total CHOL/HDL Ratio: 3
Triglycerides: 73 mg/dL (ref 0.0–149.0)
VLDL: 14.6 mg/dL (ref 0.0–40.0)

## 2017-01-07 LAB — HEPATIC FUNCTION PANEL
ALT: 17 U/L (ref 0–35)
AST: 19 U/L (ref 0–37)
Albumin: 4.5 g/dL (ref 3.5–5.2)
Alkaline Phosphatase: 47 U/L (ref 39–117)
BILIRUBIN TOTAL: 0.7 mg/dL (ref 0.2–1.2)
Bilirubin, Direct: 0.1 mg/dL (ref 0.0–0.3)
Total Protein: 6.5 g/dL (ref 6.0–8.3)

## 2017-01-07 NOTE — Progress Notes (Signed)
Pre-visit discussion using our clinic review tool. No additional management support is needed unless otherwise documented below in the visit note.  

## 2017-01-07 NOTE — Progress Notes (Signed)
Patient ID: Charlotte Wagner, Dr., female   DOB: November 09, 1935, 81 y.o.   MRN: [id number]   Subjective:    Patient ID: Charlotte Wagner, Dr., female    DOB: May 21, 1936, 81 y.o.   MRN: [id number]  HPI  Patient here for a scheduled follow up.  She reports she is still sob.  She has been followed by cardiology.  Known CAD.  s/p DES to SVG to RCA 06/01/16.  On plavix.  Last seen 12/18/16.  Recommended continuing cardiac rehab.  She also has known afib.  Rate has been controlled.  No evidence CHF on exam.  Blood pressure controlled.  Still with increased sob.  Discussed probably multifactorial.  Dr Yvone Neu recommended referral to EP to confirm symptoms not related to her current rhythm.  She questioned if could have underlying lung issue contributing.  Discussed CT chest results.  Discussed referral to pulmonary.  Given CT as outlined, she declines further pulmonary w/up.  Also discussed her back and leg pain.  Limits her activity.  Has known spinal stenosis.  Feels is more of an issue now.  Discussed treatment options.  Discussed referral to dr Sharlet Salina.  She is agreeable.  Also discussed deconditioning as possible etiology.  While in the ER 10/2016, had CT chest. Revealed liver lesions.  She states has had cysts on her liver previously.  Discussed MRI.  She declines.  Wants to hold on any further scanning.  No abdominal pain. Eating and drinking well.  Bowels stable.     Past Medical History:  Diagnosis Date  . Allergy   . Arthritis    s/p bilateral knees and left hip replacement  . Asthma   . CAD (coronary artery disease)    a. 12/1996 s/p CABG x4 ([place]);  b. 2005 Pt reports stress test & cath, which revealed patent grafts.  . Cancer ([place])    melanoma right arm  . Carotid arterial disease ([place])    a. 04/2015 Carotid U/S: <50% bilat ICA stenosis.  . Chronic kidney disease   . Colon polyps    H/O  . Depression   . GERD (gastroesophageal reflux disease)    h/o hiatal hernia  . Headache    migraines in  past  . Heart murmur    a. 04/2011 Echo: EF 55-60%, bilat atrial enlargement, mild to mod TR.  [name] History of chicken pox   . History of hiatal hernia   . Hx of migraines    rare now  . Hx: UTI (urinary tract infection)   . Hyperlipidemia    a. Statin intolerant -->on zetia.  . Hypertensive heart disease   . Lichen planus   . Melanoma ([place])   . Palpitations    a. rare PVC's and h/o SVT.  [name] PMR (polymyalgia rheumatica) ([facility])    h/o in setting of crestor usage.  [name] PSVT (paroxysmal supraventricular tachycardia) ([place])   . PUD (peptic ulcer disease)    remote history  . Raynaud's phenomenon   . Spinal stenosis   . Urine incontinence    H/O  . Vitamin D deficiency    Past Surgical History:  Procedure Laterality Date  . ADENOIDECTOMY     age 61  . BACK SURGERY     L3-L5  . BILATERAL CARPAL TUNNEL RELEASE    . BREAST BIOPSY Right    bx x 3 neg  . BREAST SURGERY Right    biopsy x 3 (all benign)  . CARDIAC CATHETERIZATION N/A 06/01/2016  Procedure: LEFT HEART CATH AND CORS/GRAFTS ANGIOGRAPHY;  Surgeon: Wellington Hampshire, MD;  Location: [facility];  Service: Cardiovascular;  Laterality: N/A;  . CARDIAC CATHETERIZATION N/A 06/01/2016   Procedure: Coronary Stent Intervention;  Surgeon: Wellington Hampshire, MD;  Location: [facility];  Service: Cardiovascular;  Laterality: N/A;  . CATARACT EXTRACTION W/PHACO Right 01/04/2016   Procedure: CATARACT EXTRACTION PHACO AND INTRAOCULAR LENS PLACEMENT (IOC);  Surgeon: Leandrew Koyanagi, MD;  Location: [facility];  Service: Ophthalmology;  Laterality: Right;  MALYUGIN  . CATARACT EXTRACTION W/PHACO Left 01/25/2016   Procedure: CATARACT EXTRACTION PHACO AND INTRAOCULAR LENS PLACEMENT ([place]) left eye;  Surgeon: Leandrew Koyanagi, MD;  Location: [facility];  Service: Ophthalmology;  Laterality: Left;  MALYUGIN SHUGARCAINE  . CHOLECYSTECTOMY  90's  . COLONOSCOPY WITH PROPOFOL N/A 05/03/2015   Procedure: COLONOSCOPY  WITH PROPOFOL;  Surgeon: Lollie Sails, MD;  Location: [facility];  Service: Endoscopy;  Laterality: N/A;  . CORONARY ARTERY BYPASS GRAFT  98   4 vessel  . ESOPHAGOGASTRODUODENOSCOPY N/A 03/22/2015   Procedure: ESOPHAGOGASTRODUODENOSCOPY (EGD);  Surgeon: Lollie Sails, MD;  Location: [facility] ENDOSCOPY;  Service: Endoscopy;  Laterality: N/A;  . JOINT REPLACEMENT     BILATERAL KNEE REPLACEMENTS  . KNEE ARTHROSCOPY W/ OATS PROCEDURE     Lt knee (9/01), Rt knee (3/11), Lt hip (5/10)  . TOTAL HIP ARTHROPLASTY     Family History  Problem Relation Age of Onset  . Thyroid disease Mother     graves disease  . Heart disease Father     rheumatic heart  . Alcohol abuse Father   . Depression Father   . Lung cancer Sister   . Depression Daughter   . Thyroid disease Daughter     hashimoto  . Depression Son   . Stroke Maternal Grandmother   . Depression Maternal Grandmother   . Hypertension Maternal Grandfather   . Hypertension Paternal Grandfather   . Breast cancer Neg Hx    Social History   Social History  . Marital status: Married    Spouse name: N/A  . Number of children: 3  . Years of education: N/A   Occupational History  . Retired Sport and exercise psychologist    Social History Main Topics  . Smoking status: Former Research scientist (life sciences)  . Smokeless tobacco: Never Used     Comment: quit 44+ yrs ago  . Alcohol use No  . Drug use: No  . Sexual activity: No   Other Topics Concern  . None   Social History Narrative   Lives in [place].  Retired Engineer, drilling.  Relatively active.    Outpatient Encounter Prescriptions as of 01/07/2017  Medication Sig  . acetaminophen (TYLENOL) 500 MG tablet Take 1,000 mg by mouth 2 (two) times daily.   [name] amitriptyline (ELAVIL) 25 MG tablet Take 1-2 tablets (25-50 mg total) by mouth at bedtime. (Patient taking differently: Take 25 mg by mouth at bedtime. )  . b complex vitamins tablet Take 1 tablet by mouth daily.  . bisoprolol (ZEBETA) 5 MG tablet Take 1 tablet  (5 mg total) by mouth at bedtime. (Patient taking differently: Take 2.5 mg by mouth at bedtime. )  . Cholecalciferol (VITAMIN D3) 2000 UNITS TABS Take 1 capsule by mouth daily.   . clopidogrel (PLAVIX) 75 MG tablet take 1 tablet by mouth once daily  . Coenzyme Q10 (COQ-10) 100 MG CAPS Take 1 tablet by mouth daily.  [name] ELIQUIS 5 MG TABS tablet take 1 tablet  by mouth twice a day  . ezetimibe (ZETIA) 10 MG tablet take 1 tablet by mouth once daily  . Fluticasone-Salmeterol (ADVAIR) 250-50 MCG/DOSE AEPB Inhale 1 puff into the lungs 2 (two) times daily as needed.  . folic acid (FOLVITE) 1 MG tablet take 1 tablet by mouth once daily  . furosemide (LASIX) 20 MG tablet Take 0.5 tablets (10 mg total) by mouth daily.  . hydrocortisone 2.5 % cream apply to VULVA twice a day AT 10AM AND 4 PM  . ipratropium (ATROVENT HFA) 17 MCG/ACT inhaler Inhale 1 puff into the lungs every 4 (four) hours as needed for wheezing.  [name] ketoconazole (NIZORAL) 2 % cream Apply 1 application topically daily. (Patient taking differently: Apply 1 application topically daily as needed for irritation. )  . loratadine (CLARITIN) 10 MG tablet Take 10 mg by mouth daily.  . meclizine (ANTIVERT) 25 MG tablet Take 25 mg by mouth 3 (three) times daily as needed for dizziness.  . miconazole (MICOTIN) 2 % cream Apply 1 application topically 2 (two) times daily as needed (irritation).  . Multiple Vitamins-Minerals (HAIR/SKIN/NAILS PO) Take 1 tablet by mouth daily.  . nitroGLYCERIN (NITROSTAT) 0.4 MG SL tablet Place 1 tablet (0.4 mg total) under the tongue every 5 (five) minutes as needed for chest pain. Place 1 tablet (0.4 mg total) under the tongue every 5 (five) minutes , 3 times as needed for chest pain.  . Omega-3 Fatty Acids (FISH OIL PO) Take 2,000 mg by mouth daily.  . pantoprazole (PROTONIX) 40 MG tablet take 1 tablet by mouth twice a day  . Polyethylene Glycol 3350 (MIRALAX PO) Take 8.5 g by mouth at bedtime.   . ramipril (ALTACE) 10 MG  capsule take 1 capsule by mouth once daily  . spironolactone (ALDACTONE) 25 MG tablet take 1 tablet by mouth every morning  . traMADol (ULTRAM) 50 MG tablet Take 1 tablet (50 mg total) by mouth daily as needed. (Patient taking differently: Take 50 mg by mouth daily as needed for moderate pain. )  . traZODone (DESYREL) 150 MG tablet take 1 and 2/3-2 tablets by mouth at bedtime as directed  . verapamil (CALAN-SR) 180 MG CR tablet Take 1 tablet (180 mg total) by mouth at bedtime. (Patient taking differently: Take 180 mg by mouth 2 (two) times daily. )  . vitamin B-12 (CYANOCOBALAMIN) 1000 MCG tablet Take 1,000 mcg by mouth daily.  . vitamin C (ASCORBIC ACID) 500 MG tablet Take 500 mg by mouth at bedtime.   No facility-administered encounter medications on file as of 01/07/2017.     Review of Systems  Constitutional: Positive for fatigue. Negative for appetite change and unexpected weight change.  HENT: Negative for congestion and sinus pressure.   Respiratory: Positive for shortness of breath. Negative for cough and chest tightness.   Cardiovascular: Negative for chest pain, palpitations and leg swelling.  Gastrointestinal: Negative for abdominal pain, nausea and vomiting.  Genitourinary: Negative for difficulty urinating and dysuria.  Musculoskeletal: Positive for back pain. Negative for myalgias.  Skin: Negative for color change and rash.  Neurological: Negative for dizziness, light-headedness and headaches.  Psychiatric/Behavioral: Negative for agitation and dysphoric mood.       Objective:     Blood pressure rechecked by me:  132/74  Physical Exam  Constitutional: She appears well-developed and well-nourished. No distress.  HENT:  Nose: Nose normal.  Mouth/Throat: Oropharynx is clear and moist.  Neck: Neck supple. No thyromegaly present.  Cardiovascular: Normal rate and regular rhythm.  Pulmonary/Chest: Breath sounds normal. No respiratory distress. She has no wheezes.    Abdominal: Soft. Bowel sounds are normal. There is no tenderness.  Musculoskeletal: She exhibits no edema or tenderness.  Lymphadenopathy:    She has no cervical adenopathy.  Skin: No rash noted. No erythema.  Psychiatric: She has a normal mood and affect. Her behavior is normal.    BP 130/76 (BP Location: Left Arm, Patient Position: Sitting, Cuff Size: Normal)   Pulse 76   Temp 98.6 F (37 C) (Oral)   Resp 16   Wt 186 lb (84.4 kg)   SpO2 98%   BMI 32.95 kg/m  Wt Readings from Last 3 Encounters:  01/07/17 186 lb (84.4 kg)  12/18/16 184 lb 8 oz (83.7 kg)  11/12/16 185 lb 11.2 oz (84.2 kg)     Lab Results  Component Value Date   WBC 5.3 10/10/2016   HGB 12.7 10/10/2016   HCT 36.1 10/10/2016   PLT 265 10/10/2016   GLUCOSE 94 01/07/2017   CHOL 195 01/07/2017   TRIG 73.0 01/07/2017   HDL 57.20 01/07/2017   LDLDIRECT 126.7 07/03/2013   LDLCALC 124 (H) 01/07/2017   ALT 17 01/07/2017   AST 19 01/07/2017   NA 137 01/07/2017   K 4.5 01/07/2017   CL 103 01/07/2017   CREATININE 0.95 01/07/2017   BUN 23 01/07/2017   CO2 28 01/07/2017   TSH 1.330 12/18/2016   INR 0.99 10/08/2016    Ct Chest High Resolution  Result Date: 10/09/2016 CLINICAL DATA:  81 year old female with history of coronary artery disease status post CABG and recent cardiac stent. Shortness of breath even with minimal exertion. Two pillow orthopnea. EXAM: CT CHEST WITHOUT CONTRAST TECHNIQUE: Multidetector CT imaging of the chest was performed following the standard protocol without intravenous contrast. High resolution imaging of the lungs, as well as inspiratory and expiratory imaging, was performed. COMPARISON:  No priors. FINDINGS: Cardiovascular: Heart size is mildly enlarged with left atrial dilatation. There is no significant pericardial fluid, thickening or pericardial calcification. There is aortic atherosclerosis, as well as atherosclerosis of the great vessels of the mediastinum and the coronary  arteries, including calcified atherosclerotic plaque in the left main, left anterior descending, left circumflex and right coronary arteries. Status post median sternotomy for CABG, including LIMA to the LAD. Mediastinum/Nodes: No pathologically enlarged mediastinal or hilar lymph nodes. Please note that accurate exclusion of hilar adenopathy is limited on noncontrast CT scans. Densely calcified middle mediastinal lymph nodes are incidentally noted. Esophagus is unremarkable in appearance. No axillary lymphadenopathy. Lungs/Pleura: High-resolution images demonstrate no significant regions of ground-glass attenuation, subpleural reticulation, parenchymal banding, traction bronchiectasis or frank honeycombing. Inspiratory and expiratory imaging demonstrates some mild air trapping, indicative of mild small airways disease. There is no acute consolidative airspace disease and no pleural effusions. No definite suspicious appearing pulmonary nodules or masses. Small calcified granuloma in the right lower lobe is incidentally noted. Upper Abdomen: Several well-defined low-attenuation lesions are noted in the liver, incompletely characterized on today's noncontrast CT examination, but favored to represent cysts, largest of which measure up to 4.0 cm in segment 3. Aortic atherosclerosis. Musculoskeletal: Median sternotomy wires. There are no aggressive appearing lytic or blastic lesions noted in the visualized portions of the skeleton. IMPRESSION: 1. No evidence of interstitial lung disease. 2. No acute findings in the thorax to account for the patient's symptoms. 3. Aortic atherosclerosis, in addition to left main and 3 vessel coronary artery disease. Please note that although the presence of  coronary artery calcium documents the presence of coronary artery disease, the severity of this disease and any potential stenosis cannot be assessed on this non-gated CT examination. Assessment for potential risk factor modification,  dietary therapy or pharmacologic therapy may be warranted, if clinically indicated. Status post median sternotomy for CABG, including LIMA to the LAD. 4. Mild cardiomegaly with left atrial dilatation. 5. **An incidental finding of potential clinical significance has been found. Multiple indeterminate low-attenuation liver lesions measuring up to 4 cm in diameter are strongly favored to represent cysts. However, these are incompletely characterized on today's noncontrast CT examination, and further characterization with nonemergent abdominal MRI with and without IV gadolinium is recommended in the near future to ensure the benign nature of these findings. This recommendation follows ACR consensus guidelines: Management of Incidental Liver Lesions on CT: A White Paper of the ACR Incidental Findings Committee. J Am Coll Radiol 2017;14:1429-1437.** Electronically Signed   By: Vinnie Langton M.D.   On: 10/09/2016 11:19       Assessment & Plan:   Problem List Items Addressed This Visit    Atrial fibrillation ([place]) - Primary    Rate controlled.  Planning for EP evaluation.       CAD (coronary artery disease)    s/p DES to SVG to RCA 05/2016 as outlined.  Seeing cardiology.  On plavix.  Just evaluated.  Recommended referral to EP.  Has appt 01/15/17.       Chronic diastolic heart failure ([facility])    No evidence of CHF on exam.  Follow.       Dyspnea    SOB with exertion as outlined.  Saw cardiology.  Planning for EP evaluation.  Felt to be multifactorial.  See note.  Desires no pulmonary w/up.  Had chest CT as outlined.  Refer to Dr Sharlet Salina to see if can help with pain control - to make more mobile.        Essential hypertension, benign    Blood pressure under good control.  Continue same medication regimen.  Follow pressures.  Follow metabolic panel.        Relevant Orders   Basic metabolic panel (Completed)   GERD (gastroesophageal reflux disease)    Controlled on current regimen.  Follow.        Hypercholesterolemia    On zetia.  Unable to take statin medications.  Follow lipid panel.       Relevant Orders   Lipid panel (Completed)   Hepatic function panel (Completed)   Hyperlipidemia    On zetia.  Statin intolerant.  Follow lipid panel.        Spinal stenosis    Chronic back pain.  Has known spinal stenosis.  Limits her activity.  Increased pain recently.  Refer to Dr Sharlet Salina for further evaluation and treatment.        Relevant Orders   Ambulatory referral to Orthopedic Surgery       [name], MD

## 2017-01-08 ENCOUNTER — Telehealth: Payer: Self-pay

## 2017-01-08 NOTE — Telephone Encounter (Signed)
Left message to return call to our office.  

## 2017-01-08 NOTE — Telephone Encounter (Signed)
Pt called back and left vm returning your call. Thank you!  Call pt @ [phone]

## 2017-01-08 NOTE — Telephone Encounter (Signed)
-----   Message from [name], MD sent at 01/08/2017  6:04 AM EDT ----- Please call and notify pt that her cholesterol has improved.  Kidney function tests and liver function tests are wnl.

## 2017-01-10 NOTE — Telephone Encounter (Signed)
Went over with patient no questions

## 2017-01-13 ENCOUNTER — Encounter: Payer: Self-pay | Admitting: Internal Medicine

## 2017-01-13 NOTE — Assessment & Plan Note (Signed)
Blood pressure under good control.  Continue same medication regimen.  Follow pressures.  Follow metabolic panel.   

## 2017-01-13 NOTE — Assessment & Plan Note (Signed)
On zetia.  Unable to take statin medications.  Follow lipid panel.

## 2017-01-13 NOTE — Assessment & Plan Note (Signed)
SOB with exertion as outlined.  Saw cardiology.  Planning for EP evaluation.  Felt to be multifactorial.  See note.  Desires no pulmonary w/up.  Had chest CT as outlined.  Refer to Dr Sharlet Salina to see if can help with pain control - to make more mobile.

## 2017-01-13 NOTE — Assessment & Plan Note (Signed)
Controlled on current regimen.  Follow.  

## 2017-01-13 NOTE — Assessment & Plan Note (Signed)
No evidence of CHF on exam.  Follow.

## 2017-01-13 NOTE — Assessment & Plan Note (Signed)
Rate controlled.  Planning for EP evaluation.

## 2017-01-13 NOTE — Assessment & Plan Note (Signed)
Chronic back pain.  Has known spinal stenosis.  Limits her activity.  Increased pain recently.  Refer to Dr Sharlet Salina for further evaluation and treatment.

## 2017-01-13 NOTE — Assessment & Plan Note (Signed)
s/p DES to SVG to RCA 05/2016 as outlined.  Seeing cardiology.  On plavix.  Just evaluated.  Recommended referral to EP.  Has appt 01/15/17.

## 2017-01-13 NOTE — Assessment & Plan Note (Signed)
On zetia.  Statin intolerant.  Follow lipid panel.

## 2017-01-29 ENCOUNTER — Other Ambulatory Visit: Payer: Self-pay | Admitting: Internal Medicine

## 2017-02-02 ENCOUNTER — Other Ambulatory Visit: Payer: Self-pay | Admitting: Cardiology

## 2017-02-04 NOTE — Telephone Encounter (Signed)
Please advise if ok to refill. 

## 2017-02-11 ENCOUNTER — Telehealth: Payer: Self-pay | Admitting: Internal Medicine

## 2017-02-11 NOTE — Telephone Encounter (Signed)
Copy mailed as requested.

## 2017-02-11 NOTE — Telephone Encounter (Signed)
Pt called requesting to have her lab results from 01/07/2017 mailed to her. The address on her chart is current. Thank you!

## 2017-02-27 ENCOUNTER — Other Ambulatory Visit: Payer: Self-pay | Admitting: Internal Medicine

## 2017-02-28 ENCOUNTER — Ambulatory Visit: Payer: Medicare Other

## 2017-02-28 ENCOUNTER — Ambulatory Visit (INDEPENDENT_AMBULATORY_CARE_PROVIDER_SITE_OTHER): Payer: Medicare Other | Admitting: Internal Medicine

## 2017-02-28 ENCOUNTER — Encounter: Payer: Self-pay | Admitting: Internal Medicine

## 2017-02-28 VITALS — BP 100/62 | HR 68 | Ht 63.0 in | Wt 186.5 lb

## 2017-02-28 DIAGNOSIS — I481 Persistent atrial fibrillation: Secondary | ICD-10-CM | POA: Diagnosis not present

## 2017-02-28 DIAGNOSIS — I4819 Other persistent atrial fibrillation: Secondary | ICD-10-CM

## 2017-02-28 MED ORDER — VERAPAMIL HCL ER 180 MG PO TBCR: 180.0000 mg | EXTENDED_RELEASE_TABLET | Freq: Every day | ORAL | Status: DC

## 2017-02-28 NOTE — Progress Notes (Signed)
ELECTROPHYSIOLOGY CONSULT NOTE  Patient ID: RAFIA Wagner, Charlotte., MRN: [id number], DOB/AGE: 12-19-1935 81 y.o. Admit date: (Not on file) Date of Consult: 02/28/2017  Primary Physician: Charlotte Pheasant, MD Primary Cardiologist: AI   Charlotte Wagner, Charlotte. is being seen today for the evaluation of afib at the request of Charlotte Wagner   HPI Charlotte Wagner, Charlotte. is a 81 y.o. female  Referred because of atrial fibrillation.  This Was present when she developed congestive heart failure and has   been persistent since January 2018. She has been treated with ELIQUIS that she takes in conjunction with Plavix.  Rate control has been well-maintained  There has never been an issue of rates, indeed she has had longstanding bradycardia   Holter monitor 11/17 was personally reviewed and demonstrated, surprisingly, 15% PVCs and sinus rhythm with a heart rate 43-82//53 (mean)  She has a history of coronary artery disease with prior bypass surgery 1998  she underwent catheterization for unstable angina 8/17 and received a DES to her vein graft to RCA. She was treated with Brilinta now on clopidogrel Echocardiogram 1/18 demonstrated normal LV function moderate- severe LAE ( 48/2.5/49) She has a history of hypertension hyperlipidemia.  She is markedly limited in her ability to get around. For shortness of breath is described variably to her heart, her rhythm, bradycardia and her mechanical issues related to kyphosis  Thromboembolic risk factors ( age  -2, HTN-1, Vasc disease -1, CHF-1, Gender-1) for a CHADSVASc Score of 6  She has a history of palpitations with PVCs in question PSVT.  Past Medical History:  Diagnosis Date  . Allergy   . Arthritis    s/p bilateral knees and left hip replacement  . Asthma   . CAD (coronary artery disease)    a. 12/1996 s/p CABG x4 ([place]);  b. 2005 Pt reports stress test & cath, which revealed patent grafts.  . Cancer ([place])    melanoma right arm  . Carotid  arterial disease ([place])    a. 04/2015 Carotid U/S: <50% bilat ICA stenosis.  . Chronic kidney disease   . Colon polyps    H/O  . Depression   . GERD (gastroesophageal reflux disease)    h/o hiatal hernia  . Headache    migraines in past  . Heart murmur    a. 04/2011 Echo: EF 55-60%, bilat atrial enlargement, mild to mod TR.  [name] History of chicken pox   . History of hiatal hernia   . Hx of migraines    rare now  . Hx: UTI (urinary tract infection)   . Hyperlipidemia    a. Statin intolerant -->on zetia.  . Hypertensive heart disease   . Lichen planus   . Melanoma ([place])   . Palpitations    a. rare PVC's and h/o SVT.  [name] PMR (polymyalgia rheumatica) ([facility])    h/o in setting of crestor usage.  [name] PSVT (paroxysmal supraventricular tachycardia) ([place])   . PUD (peptic ulcer disease)    remote history  . Raynaud's phenomenon   . Spinal stenosis   . Urine incontinence    H/O  . Vitamin D deficiency       Surgical History:  Past Surgical History:  Procedure Laterality Date  . ADENOIDECTOMY     age 13  . BACK SURGERY     L3-L5  . BILATERAL CARPAL TUNNEL RELEASE    . BREAST BIOPSY Right    bx x 3 neg  .  BREAST SURGERY Right    biopsy x 3 (all benign)  . CARDIAC CATHETERIZATION N/A 06/01/2016   Procedure: LEFT HEART CATH AND CORS/GRAFTS ANGIOGRAPHY;  Surgeon: Wellington Hampshire, MD;  Location: [facility];  Service: Cardiovascular;  Laterality: N/A;  . CARDIAC CATHETERIZATION N/A 06/01/2016   Procedure: Coronary Stent Intervention;  Surgeon: Wellington Hampshire, MD;  Location: [facility];  Service: Cardiovascular;  Laterality: N/A;  . CATARACT EXTRACTION W/PHACO Right 01/04/2016   Procedure: CATARACT EXTRACTION PHACO AND INTRAOCULAR LENS PLACEMENT (IOC);  Surgeon: Leandrew Koyanagi, MD;  Location: [facility];  Service: Ophthalmology;  Laterality: Right;  MALYUGIN  . CATARACT EXTRACTION W/PHACO Left 01/25/2016   Procedure: CATARACT EXTRACTION PHACO AND INTRAOCULAR  LENS PLACEMENT ([place]) left eye;  Surgeon: Leandrew Koyanagi, MD;  Location: [facility];  Service: Ophthalmology;  Laterality: Left;  MALYUGIN SHUGARCAINE  . CHOLECYSTECTOMY  90's  . COLONOSCOPY WITH PROPOFOL N/A 05/03/2015   Procedure: COLONOSCOPY WITH PROPOFOL;  Surgeon: Lollie Sails, MD;  Location: [facility] END[facility];  Service: Endoscopy;  Laterality: N/A;  . CORONARY ARTERY BYPASS GRAFT  98   4 vessel  . ESOPHAGOGASTRODUODENOSCOPY N/A 03/22/2015   Procedure: ESOPHAGOGASTRODUODENOSCOPY (EGD);  Surgeon: Lollie Sails, MD;  Location[facility] ENDOSCOPY;  Service: Endoscopy;  Laterality: N/A;  . JOINT REPLACEMENT     BILATERAL KNEE REPLACEMENTS  . KNEE ARTHROSCOPY W/ OATS PROCEDURE     Lt knee (9/01), Rt knee (3/11), Lt hip (5/10)  . TOTAL HIP ARTHROPLASTY       Home Meds: Prior to Admission medications   Medication Sig Start Date End Date Taking? Authorizing Provider  acetaminophen (TYLENOL) 500 MG tablet Take 1,000 mg by mouth 2 (two) times daily.     [provider]  amitriptyline (ELAVIL) 25 MG tablet Take 1-2 tablets (25-50 mg total) by mouth at bedtime. Patient taking differently: Take 25 mg by mouth at bedtime.  09/06/16   Charlotte Pheasant, MD  b complex vitamins tablet Take 1 tablet by mouth daily.    [provider]  bisoprolol (ZEBETA) 5 MG tablet Take 1 tablet (5 mg total) by mouth at bedtime. Patient taking differently: Take 2.5 mg by mouth at bedtime.  09/04/16   Wende Bushy, MD  Cholecalciferol (VITAMIN D3) 2000 UNITS TABS Take 1 capsule by mouth daily.     [provider]  clopidogrel (PLAVIX) 75 MG tablet take 1 tablet by mouth once daily 02/05/17   Wende Bushy, MD  Coenzyme Q10 (COQ-10) 100 MG CAPS Take 1 tablet by mouth daily.    [provider]  ELIQUIS 5 MG TABS tablet take 1 tablet by mouth twice a day 02/05/17   Wende Bushy, MD  ezetimibe (ZETIA) 10 MG tablet take 1 tablet by mouth once daily 09/03/16   Charlotte Pheasant, MD   Fluticasone-Salmeterol (ADVAIR) 250-50 MCG/DOSE AEPB Inhale 1 puff into the lungs 2 (two) times daily as needed.    [provider]  folic acid (FOLVITE) 1 MG tablet take 1 tablet by mouth once daily 12/10/16   Charlotte Pheasant, MD  furosemide (LASIX) 20 MG tablet Take 0.5 tablets (10 mg total) by mouth daily. 10/11/16 01/09/17  Wende Bushy, MD  hydrocortisone 2.5 % cream apply to VULVA twice a day AT 10AM AND 4 PM 02/06/16   Charlotte Pheasant, MD  ipratropium (ATROVENT HFA) 17 MCG/ACT inhaler Inhale 1 puff into the lungs every 4 (four) hours as needed for wheezing.    [provider]  ketoconazole (  NIZORAL) 2 % cream Apply 1 application topically daily. Patient taking differently: Apply 1 application topically daily as needed for irritation.  06/15/14   Charlotte Pheasant, MD  loratadine (CLARITIN) 10 MG tablet Take 10 mg by mouth daily.    [provider]  meclizine (ANTIVERT) 25 MG tablet Take 25 mg by mouth 3 (three) times daily as needed for dizziness.    [provider]  miconazole (MICOTIN) 2 % cream Apply 1 application topically 2 (two) times daily as needed (irritation).    [provider]  Multiple Vitamins-Minerals (HAIR/SKIN/NAILS PO) Take 1 tablet by mouth daily.    [provider]  nitroGLYCERIN (NITROSTAT) 0.4 MG SL tablet Place 1 tablet (0.4 mg total) under the tongue every 5 (five) minutes as needed for chest pain. Place 1 tablet (0.4 mg total) under the tongue every 5 (five) minutes , 3 times as needed for chest pain. 06/07/16   Nicholes Mango, MD  Omega-3 Fatty Acids (FISH OIL PO) Take 2,000 mg by mouth daily.    [provider]  pantoprazole (PROTONIX) 40 MG tablet take 1 tablet by mouth twice a day 01/30/17   Charlotte Pheasant, MD  Polyethylene Glycol 3350 (MIRALAX PO) Take 8.5 g by mouth at bedtime.     [provider]  ramipril (ALTACE) 10 MG capsule take 1 capsule by mouth once daily 11/16/16   Charlotte Pheasant, MD    spironolactone (ALDACTONE) 25 MG tablet take 1 tablet by mouth every morning 02/28/17   Charlotte Pheasant, MD  traMADol (ULTRAM) 50 MG tablet Take 1 tablet (50 mg total) by mouth daily as needed. Patient taking differently: Take 50 mg by mouth daily as needed for moderate pain.  08/16/15   Charlotte Pheasant, MD  traZODone (DESYREL) 150 MG tablet take 1 and 2/3-2 tablets by mouth at bedtime as directed 11/29/16   Charlotte Pheasant, MD  verapamil (CALAN-SR) 180 MG CR tablet Take 1 tablet (180 mg total) by mouth at bedtime. Patient taking differently: Take 180 mg by mouth 2 (two) times daily.  10/10/16   Dustin Flock, MD  vitamin B-12 (CYANOCOBALAMIN) 1000 MCG tablet Take 1,000 mcg by mouth daily.    [provider]  vitamin C (ASCORBIC ACID) 500 MG tablet Take 500 mg by mouth at bedtime.    [provider]    Allergies:  Allergies  Allergen Reactions  . Beta Adrenergic Blockers Shortness Of Breath  . Albuterol     rigors  . Aspirin Other (See Comments)    Heartburn   . Penicillins Hives and Other (See Comments)    Has patient had a PCN reaction causing immediate rash, facial/tongue/throat swelling, SOB or lightheadedness with hypotension: No Has patient had a PCN reaction causing severe rash involving mucus membranes or skin necrosis: No Has patient had a PCN reaction that required hospitalization No Has patient had a PCN reaction occurring within the last 10 years: No If all of the above answers are "NO", then may proceed with Cephalosporin use.   . Statins Other (See Comments)    Muscle weakness  . Sulfa Antibiotics Rash    At mouth  . Tetracycline Rash  . Tetracyclines & Related Rash    Social History   Social History  . Marital status: Married    Spouse name: N/A  . Number of children: 3  . Years of education: N/A   Occupational History  . Retired Sport and exercise psychologist    Social History Main Topics  . Smoking status:  Former Smoker  . Smokeless tobacco: Never  Used     Comment: quit 44+ yrs ago  . Alcohol use No  . Drug use: No  . Sexual activity: No   Other Topics Concern  . Not on file   Social History Narrative   Lives in [place].  Retired Engineer, drilling.  Relatively active.     Family History  Problem Relation Age of Onset  . Thyroid disease Mother        graves disease  . Heart disease Father        rheumatic heart  . Alcohol abuse Father   . Depression Father   . Lung cancer Sister   . Depression Daughter   . Thyroid disease Daughter        hashimoto  . Depression Son   . Stroke Maternal Grandmother   . Depression Maternal Grandmother   . Hypertension Maternal Grandfather   . Hypertension Paternal Grandfather   . Breast cancer Neg Hx      ROS:  Please see the history of present illness.     All other systems reviewed and negative.    Physical Exam  Blood pressure 100/62, pulse 68, height 5\' 3"  (1.6 m), weight 186 lb 8 oz (84.6 kg). General: Well developed, well nourished female in no acute distress. Head: Normocephalic, atraumatic, sclera non-icteric, no xanthomas, nares are without discharge. EENT: normal  Lymph Nodes:  none Neck: Negative for carotid bruits. JVD not elevated. Back:without scoliosis marked kyphosis  Lungs: Clear bilaterally to auscultation without wheezes, rales, or rhonchi. Breathing is unlabored. Heart:i  Irregularly irregular RRR with S1 S2.  2/6 systolic  murmur . No rubs, or gallops appreciated. Abdomen: Soft, non-tender, non-distended with normoactive bowel sounds. No hepatomegaly. No rebound/guarding. No obvious abdominal masses. Msk:  Strength and tone appear normal for age. Extremities: No clubbing or cyanosis. L>R  1 + edema.  Distal pedal pulses are 2+ and equal bilaterally. Skin: Warm and Dry Neuro: Alert and oriented X 3. CN III-XII intact Grossly normal sensory and motor function . Psych:  Responds to questions appropriately with a normal affect.      Labs: Cardiac Enzymes No  results for input(s): CKTOTAL, CKMB, TROPONINI in the last 72 hours. CBC Lab Results  Component Value Date   WBC 5.3 10/10/2016   HGB 12.7 10/10/2016   HCT 36.1 10/10/2016   MCV 98.5 10/10/2016   PLT 265 10/10/2016   Date Cr K  4/18 0.95 4.5           Lipids Lab Results  Component Value Date   CHOL 195 01/07/2017   HDL 57.20 01/07/2017   LDLCALC 124 (H) 01/07/2017   TRIG 73.0 01/07/2017   Personally reviewed      EKG:  Atrial fibrillation at 68   Assessment and Plan:  Atrial fibrillation-persistent  Coronary artery disease status post CABG and recent stenting   Exercise intolerance/HFpEF  Bradycardia    The patient has persistent atrial fibrillation. Left atrial sizes are modestly increased. And this was noted at the onset of her atrial fibrillation suggesting long-standing atriopathy.  I am not sure about the ability to restore sinus rhythm but I would favor cardioversion.  She also has bradycardia. I'm not sure that this is facilitating cardiovascular performance so we will discontinue her bisoprolol and decrease her verapamil from 180 twice a day--180 daily. She may need augmented antihypertensive therapy  No bleeding issues on the apixoban. She is scheduled to come off of her  Plavix in a few months  Without symptoms of ischemia      Virl Axe

## 2017-02-28 NOTE — Patient Instructions (Addendum)
Medication Instructions: - Your physician has recommended you make the following change in your medication:  1) Stop bisoprolol 2) Decrease verapamil 180 mg- take one tablet by mouth once daily at bedtime  Labwork: - Your physician recommends that you have lab work today: BMP/CBC  Procedures/Testing: - Your physician has recommended that you have a Cardioversion (DCCV). Electrical Cardioversion uses a jolt of electricity to your heart either through paddles or wired patches attached to your chest. This is a controlled, usually prescheduled, procedure. Defibrillation is done under light anesthesia in the hospital, and you usually go home the day of the procedure. This is done to get your heart back into a normal rhythm. You are not awake for the procedure. Please see the instruction sheet given to you today.  You are scheduled for a Cardioversion on ___06/1/18____ with Dr._Arida__ Please arrive at the [place] [facility] at _7:30___ a.m. on the day of your procedure.  DIET INSTRUCTIONS:  Nothing to eat or drink after midnight except your medications with a              sip of water.         1) Labs: __________________  2) Medications:  YOU MAY TAKE ALL of your remaining medications with a small amount of water.  3) Must have a responsible person to drive you home.  4) Bring a current list of your medications and current insurance cards.    If you have any questions after you get home, please call the office at [phone]    Follow-Up: - Your physician recommends that you schedule a follow-up appointment in: 4 weeks (from ) with Dr. Caryl Comes.    Any Additional Special Instructions Will Be Listed Below (If Applicable).     If you need a refill on your cardiac medications before your next appointment, please call your pharmacy.

## 2017-03-01 LAB — CBC WITH DIFFERENTIAL/PLATELET
BASOS ABS: 0 10*3/uL (ref 0.0–0.2)
Basos: 0 %
EOS (ABSOLUTE): 0.1 10*3/uL (ref 0.0–0.4)
EOS: 2 %
HEMATOCRIT: 38.3 % (ref 34.0–46.6)
Hemoglobin: 12.8 g/dL (ref 11.1–15.9)
IMMATURE GRANULOCYTES: 0 %
Immature Grans (Abs): 0 10*3/uL (ref 0.0–0.1)
Lymphocytes Absolute: 0.8 10*3/uL (ref 0.7–3.1)
Lymphs: 19 %
MCH: 33.1 pg — ABNORMAL HIGH (ref 26.6–33.0)
MCHC: 33.4 g/dL (ref 31.5–35.7)
MCV: 99 fL — ABNORMAL HIGH (ref 79–97)
MONOS ABS: 0.5 10*3/uL (ref 0.1–0.9)
Monocytes: 12 %
NEUTROS PCT: 67 %
Neutrophils Absolute: 2.9 10*3/uL (ref 1.4–7.0)
PLATELETS: 187 10*3/uL (ref 150–379)
RBC: 3.87 x10E6/uL (ref 3.77–5.28)
RDW: 13.6 % (ref 12.3–15.4)
WBC: 4.3 10*3/uL (ref 3.4–10.8)

## 2017-03-01 LAB — BASIC METABOLIC PANEL
BUN/Creatinine Ratio: 21 (ref 12–28)
BUN: 21 mg/dL (ref 8–27)
CHLORIDE: 97 mmol/L (ref 96–106)
CO2: 21 mmol/L (ref 18–29)
CREATININE: 1.02 mg/dL — AB (ref 0.57–1.00)
Calcium: 9.7 mg/dL (ref 8.7–10.3)
GFR calc Af Amer: 61 mL/min/{1.73_m2} (ref >59)
GFR calc non Af Amer: 53 mL/min/{1.73_m2} — ABNORMAL LOW (ref >59)
GLUCOSE: 99 mg/dL (ref 65–99)
Potassium: 4.6 mmol/L (ref 3.5–5.2)
Sodium: 135 mmol/L (ref 134–144)

## 2017-03-04 ENCOUNTER — Other Ambulatory Visit: Payer: Self-pay | Admitting: Internal Medicine

## 2017-03-04 DIAGNOSIS — I4819 Other persistent atrial fibrillation: Secondary | ICD-10-CM

## 2017-03-08 ENCOUNTER — Ambulatory Visit
Admission: RE | Admit: 2017-03-08 | Discharge: 2017-03-08 | Disposition: A | Discharge status: Home / Self Care | Payer: Medicare Other | Source: Ambulatory Visit | Attending: Internal Medicine | Admitting: Internal Medicine

## 2017-03-08 ENCOUNTER — Encounter
Admission: RE | Disposition: A | Discharge status: Home / Self Care | Payer: Self-pay | Source: Ambulatory Visit | Attending: Internal Medicine

## 2017-03-08 ENCOUNTER — Encounter: Payer: Self-pay | Admitting: Anesthesiology

## 2017-03-08 ENCOUNTER — Ambulatory Visit: Payer: Medicare Other | Admitting: Anesthesiology

## 2017-03-08 DIAGNOSIS — Z880 Allergy status to penicillin: Secondary | ICD-10-CM | POA: Insufficient documentation

## 2017-03-08 DIAGNOSIS — I5030 Unspecified diastolic (congestive) heart failure: Secondary | ICD-10-CM | POA: Insufficient documentation

## 2017-03-08 DIAGNOSIS — Z7902 Long term (current) use of antithrombotics/antiplatelets: Secondary | ICD-10-CM | POA: Diagnosis not present

## 2017-03-08 DIAGNOSIS — K219 Gastro-esophageal reflux disease without esophagitis: Secondary | ICD-10-CM | POA: Insufficient documentation

## 2017-03-08 DIAGNOSIS — F419 Anxiety disorder, unspecified: Secondary | ICD-10-CM | POA: Insufficient documentation

## 2017-03-08 DIAGNOSIS — Z951 Presence of aortocoronary bypass graft: Secondary | ICD-10-CM | POA: Diagnosis not present

## 2017-03-08 DIAGNOSIS — Z7901 Long term (current) use of anticoagulants: Secondary | ICD-10-CM | POA: Diagnosis not present

## 2017-03-08 DIAGNOSIS — I4819 Other persistent atrial fibrillation: Secondary | ICD-10-CM

## 2017-03-08 DIAGNOSIS — Z96653 Presence of artificial knee joint, bilateral: Secondary | ICD-10-CM | POA: Insufficient documentation

## 2017-03-08 DIAGNOSIS — Z818 Family history of other mental and behavioral disorders: Secondary | ICD-10-CM | POA: Insufficient documentation

## 2017-03-08 DIAGNOSIS — I481 Persistent atrial fibrillation: Secondary | ICD-10-CM | POA: Insufficient documentation

## 2017-03-08 DIAGNOSIS — Z955 Presence of coronary angioplasty implant and graft: Secondary | ICD-10-CM | POA: Diagnosis not present

## 2017-03-08 DIAGNOSIS — E559 Vitamin D deficiency, unspecified: Secondary | ICD-10-CM | POA: Diagnosis not present

## 2017-03-08 DIAGNOSIS — Z888 Allergy status to other drugs, medicaments and biological substances status: Secondary | ICD-10-CM | POA: Insufficient documentation

## 2017-03-08 DIAGNOSIS — M353 Polymyalgia rheumatica: Secondary | ICD-10-CM | POA: Diagnosis not present

## 2017-03-08 DIAGNOSIS — I2510 Atherosclerotic heart disease of native coronary artery without angina pectoris: Secondary | ICD-10-CM | POA: Diagnosis not present

## 2017-03-08 DIAGNOSIS — M1990 Unspecified osteoarthritis, unspecified site: Secondary | ICD-10-CM | POA: Diagnosis not present

## 2017-03-08 DIAGNOSIS — Z96642 Presence of left artificial hip joint: Secondary | ICD-10-CM | POA: Diagnosis not present

## 2017-03-08 DIAGNOSIS — I4891 Unspecified atrial fibrillation: Secondary | ICD-10-CM | POA: Diagnosis not present

## 2017-03-08 DIAGNOSIS — I10 Essential (primary) hypertension: Secondary | ICD-10-CM | POA: Diagnosis not present

## 2017-03-08 DIAGNOSIS — E785 Hyperlipidemia, unspecified: Secondary | ICD-10-CM | POA: Insufficient documentation

## 2017-03-08 DIAGNOSIS — Z79899 Other long term (current) drug therapy: Secondary | ICD-10-CM | POA: Insufficient documentation

## 2017-03-08 DIAGNOSIS — I7300 Raynaud's syndrome without gangrene: Secondary | ICD-10-CM | POA: Insufficient documentation

## 2017-03-08 DIAGNOSIS — Z811 Family history of alcohol abuse and dependence: Secondary | ICD-10-CM | POA: Insufficient documentation

## 2017-03-08 DIAGNOSIS — Z87891 Personal history of nicotine dependence: Secondary | ICD-10-CM | POA: Insufficient documentation

## 2017-03-08 DIAGNOSIS — Z886 Allergy status to analgesic agent status: Secondary | ICD-10-CM | POA: Insufficient documentation

## 2017-03-08 DIAGNOSIS — Z86010 Personal history of colonic polyps: Secondary | ICD-10-CM | POA: Diagnosis not present

## 2017-03-08 DIAGNOSIS — J45909 Unspecified asthma, uncomplicated: Secondary | ICD-10-CM | POA: Insufficient documentation

## 2017-03-08 DIAGNOSIS — Z85820 Personal history of malignant melanoma of skin: Secondary | ICD-10-CM | POA: Insufficient documentation

## 2017-03-08 DIAGNOSIS — Z881 Allergy status to other antibiotic agents status: Secondary | ICD-10-CM | POA: Insufficient documentation

## 2017-03-08 DIAGNOSIS — F329 Major depressive disorder, single episode, unspecified: Secondary | ICD-10-CM | POA: Diagnosis not present

## 2017-03-08 DIAGNOSIS — I25810 Atherosclerosis of coronary artery bypass graft(s) without angina pectoris: Secondary | ICD-10-CM | POA: Diagnosis not present

## 2017-03-08 DIAGNOSIS — I471 Supraventricular tachycardia: Secondary | ICD-10-CM | POA: Insufficient documentation

## 2017-03-08 DIAGNOSIS — R001 Bradycardia, unspecified: Secondary | ICD-10-CM | POA: Diagnosis not present

## 2017-03-08 DIAGNOSIS — I739 Peripheral vascular disease, unspecified: Secondary | ICD-10-CM | POA: Diagnosis not present

## 2017-03-08 DIAGNOSIS — Z8711 Personal history of peptic ulcer disease: Secondary | ICD-10-CM | POA: Insufficient documentation

## 2017-03-08 DIAGNOSIS — Z8249 Family history of ischemic heart disease and other diseases of the circulatory system: Secondary | ICD-10-CM | POA: Insufficient documentation

## 2017-03-08 DIAGNOSIS — Z882 Allergy status to sulfonamides status: Secondary | ICD-10-CM | POA: Insufficient documentation

## 2017-03-08 DIAGNOSIS — I130 Hypertensive heart and chronic kidney disease with heart failure and stage 1 through stage 4 chronic kidney disease, or unspecified chronic kidney disease: Secondary | ICD-10-CM | POA: Insufficient documentation

## 2017-03-08 DIAGNOSIS — M40209 Unspecified kyphosis, site unspecified: Secondary | ICD-10-CM | POA: Diagnosis not present

## 2017-03-08 DIAGNOSIS — I6523 Occlusion and stenosis of bilateral carotid arteries: Secondary | ICD-10-CM | POA: Diagnosis not present

## 2017-03-08 HISTORY — PX: CARDIOVERSION: EP1203

## 2017-03-08 SURGERY — CARDIOVERSION (CATH LAB)
Anesthesia: General

## 2017-03-08 MED ORDER — FENTANYL CITRATE (PF) 100 MCG/2ML IJ SOLN: 25.0000 ug | INTRAMUSCULAR | Status: DC | PRN

## 2017-03-08 MED ORDER — PROPOFOL 10 MG/ML IV BOLUS
INTRAVENOUS | Status: DC | PRN
  Administered 2017-03-08: 65 mg via INTRAVENOUS

## 2017-03-08 MED ORDER — SODIUM CHLORIDE 0.9 % IV SOLN
INTRAVENOUS | Status: DC
  Administered 2017-03-08: 09:00:00 via INTRAVENOUS

## 2017-03-08 MED ORDER — PROPOFOL 10 MG/ML IV BOLUS
INTRAVENOUS | Status: AC
  Filled 2017-03-08: qty 20

## 2017-03-08 MED ORDER — ONDANSETRON HCL 4 MG/2ML IJ SOLN: 4.0000 mg | Freq: Once | INTRAMUSCULAR | Status: DC | PRN

## 2017-03-08 NOTE — Anesthesia Post-op Follow-up Note (Cosign Needed)
Anesthesia QCDR form completed.        

## 2017-03-08 NOTE — Transfer of Care (Signed)
Immediate Anesthesia Transfer of Care Note  Patient: Charlotte Wagner, Dr.  Jule Ser) Performed: Procedure(s): CARDIOVERSION (N/A)  Patient Location: PACU and Short Stay  Anesthesia Type:General  Level of Consciousness: awake, alert  and oriented  Airway & Oxygen Therapy: Patient Spontanous Breathing and Patient connected to nasal cannula oxygen  Post-op Assessment: Report given to RN and Post -op Vital signs reviewed and stable  Post vital signs: Reviewed and stable  Last Vitals:  Vitals:   03/08/17 0929 03/08/17 0930  BP:  (!) 150/71  Pulse: 73   Resp: 18 18  Temp:      Complications: No apparent anesthesia complications

## 2017-03-08 NOTE — Anesthesia Preprocedure Evaluation (Addendum)
Anesthesia Evaluation  Patient identified by MRN, date of birth, ID band Patient awake    Reviewed: Allergy & Precautions, NPO status , Patient's Chart, lab work & pertinent test results, reviewed documented beta blocker date and time   Airway Mallampati: II  TM Distance: >3 FB Neck ROM: Full    Dental no notable dental hx. (+) Caps   Pulmonary shortness of breath and with exertion, asthma , former smoker,    Pulmonary exam normal        Cardiovascular hypertension, + angina + CAD, + CABG (3v in 1998) and + Peripheral Vascular Disease  Normal cardiovascular exam+ dysrhythmias Atrial Fibrillation + Valvular Problems/Murmurs      Neuro/Psych  Headaches, PSYCHIATRIC DISORDERS Anxiety Depression  Neuromuscular disease    GI/Hepatic hiatal hernia, PUD, GERD  Medicated and Controlled,  Endo/Other  PMR  Renal/GU CRFRenal disease     Musculoskeletal  (+) Arthritis , Osteoarthritis,    Abdominal   Peds negative pediatric ROS (+)  Hematology negative hematology ROS (+)   Anesthesia Other Findings   Reproductive/Obstetrics                            Anesthesia Physical  Anesthesia Plan  ASA: III  Anesthesia Plan: General   Post-op Pain Management:    Induction: Intravenous  Airway Management Planned: Nasal Cannula  Additional Equipment:   Intra-op Plan:   Post-operative Plan:   Informed Consent: I have reviewed the patients History and Physical, chart, labs and discussed the procedure including the risks, benefits and alternatives for the proposed anesthesia with the patient or authorized representative who has indicated his/her understanding and acceptance.     Plan Discussed with: CRNA and Surgeon  Anesthesia Plan Comments:         Anesthesia Quick Evaluation

## 2017-03-08 NOTE — H&P (View-Only) (Signed)
ELECTROPHYSIOLOGY CONSULT NOTE  Patient ID: Charlotte Wagner, Dr., MRN: [id number], DOB/AGE: 05/04/1936 81 y.o. 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  This Was present when she developed congestive heart failure and has   been persistent since January 2018. She has been treated with ELIQUIS that she takes in conjunction with Plavix.  Rate control has been well-maintained  There has never been an issue of rates, indeed she has had longstanding bradycardia   Holter monitor 11/17 was personally reviewed and demonstrated, surprisingly, 15% PVCs and sinus rhythm with a heart rate 43-82//53 (mean)  She has a history of coronary artery disease with prior bypass surgery 1998  she underwent catheterization for unstable angina 8/17 and received a DES to her vein graft to RCA. She was treated with Brilinta now on clopidogrel Echocardiogram 1/18 demonstrated normal LV function moderate- severe LAE ( 48/2.5/49) She has a history of hypertension hyperlipidemia.  She is markedly limited in her ability to get around. For shortness of breath is described variably to her heart, her rhythm, bradycardia and her mechanical issues related to kyphosis  Thromboembolic risk factors ( age  -2, HTN-1, Vasc disease -1, CHF-1, Gender-1) for a CHADSVASc Score of 6  She has a history of palpitations with PVCs in question PSVT.  Past Medical History:  Diagnosis Date  . Allergy   . Arthritis    s/p bilateral knees and left hip replacement  . Asthma   . CAD (coronary artery disease)    a. 12/1996 s/p CABG x4 ([facility]);  b. 2005 Pt reports stress test & cath, which revealed patent grafts.  . Cancer ([place])    melanoma right arm  . Carotid  arterial disease ([place])    a. 04/2015 Carotid U/S: <50% bilat ICA stenosis.  . Chronic kidney disease   . Colon polyps    H/O  . Depression   . GERD (gastroesophageal reflux disease)    h/o hiatal hernia  . Headache    migraines in past  . Heart murmur    a. 04/2011 Echo: EF 55-60%, bilat atrial enlargement, mild to mod TR.  [name] History of chicken pox   . History of hiatal hernia   . Hx of migraines    rare now  . Hx: UTI (urinary tract infection)   . Hyperlipidemia    a. Statin intolerant -->on zetia.  . Hypertensive heart disease   . Lichen planus   . Melanoma ([place])   . Palpitations    a. rare PVC's and h/o SVT.  [name] PMR (polymyalgia rheumatica) ([facility])    h/o in setting of crestor usage.  [name] PSVT (paroxysmal supraventricular tachycardia) ([place])   . PUD (peptic ulcer disease)    remote history  . Raynaud's phenomenon   . Spinal stenosis   . Urine incontinence    H/O  . Vitamin D deficiency       Surgical History:  Past Surgical History:  Procedure Laterality Date  . ADENOIDECTOMY     age 88  . BACK SURGERY     L3-L5  . BILATERAL CARPAL TUNNEL RELEASE    . BREAST BIOPSY Right    bx x 3 neg  .  BREAST SURGERY Right    biopsy x 3 (all benign)  . CARDIAC CATHETERIZATION N/A 06/01/2016   Procedure: LEFT HEART CATH AND CORS/GRAFTS ANGIOGRAPHY;  Surgeon: Wellington Hampshire, MD;  Location: [facility];  Service: Cardiovascular;  Laterality: N/A;  . CARDIAC CATHETERIZATION N/A 06/01/2016   Procedure: Coronary Stent Intervention;  Surgeon: Wellington Hampshire, MD;  Location: [facility];  Service: Cardiovascular;  Laterality: N/A;  . CATARACT EXTRACTION W/PHACO Right 01/04/2016   Procedure: CATARACT EXTRACTION PHACO AND INTRAOCULAR LENS PLACEMENT (IOC);  Surgeon: Leandrew Koyanagi, MD;  Location: [facility];  Service: Ophthalmology;  Laterality: Right;  MALYUGIN  . CATARACT EXTRACTION W/PHACO Left 01/25/2016   Procedure: CATARACT EXTRACTION PHACO AND INTRAOCULAR  LENS PLACEMENT ([place]) left eye;  Surgeon: Leandrew Koyanagi, MD;  Location: [facility];  Service: Ophthalmology;  Laterality: Left;  MALYUGIN SHUGARCAINE  . CHOLECYSTECTOMY  90's  . COLONOSCOPY WITH PROPOFOL N/A 05/03/2015   Procedure: COLONOSCOPY WITH PROPOFOL;  Surgeon: Lollie Sails, MD;  Location: [facility];  Service: Endoscopy;  Laterality: N/A;  . CORONARY ARTERY BYPASS GRAFT  98   4 vessel  . ESOPHAGOGASTRODUODENOSCOPY N/A 03/22/2015   Procedure: ESOPHAGOGASTRODUODENOSCOPY (EGD);  Surgeon: Lollie Sails, MD;  Location: [facility] ENDOSCOPY;  Service: Endoscopy;  Laterality: N/A;  . JOINT REPLACEMENT     BILATERAL KNEE REPLACEMENTS  . KNEE ARTHROSCOPY W/ OATS PROCEDURE     Lt knee (9/01), Rt knee (3/11), Lt hip (5/10)  . TOTAL HIP ARTHROPLASTY       Home Meds: Prior to Admission medications   Medication Sig Start Date End Date Taking? Authorizing Provider  acetaminophen (TYLENOL) 500 MG tablet Take 1,000 mg by mouth 2 (two) times daily.     [provider]  amitriptyline (ELAVIL) 25 MG tablet Take 1-2 tablets (25-50 mg total) by mouth at bedtime. Patient taking differently: Take 25 mg by mouth at bedtime.  09/06/16   [name], MD  b complex vitamins tablet Take 1 tablet by mouth daily.    [provider]  bisoprolol (ZEBETA) 5 MG tablet Take 1 tablet (5 mg total) by mouth at bedtime. Patient taking differently: Take 2.5 mg by mouth at bedtime.  09/04/16   Wende Bushy, MD  Cholecalciferol (VITAMIN D3) 2000 UNITS TABS Take 1 capsule by mouth daily.     [provider]  clopidogrel (PLAVIX) 75 MG tablet take 1 tablet by mouth once daily 02/05/17   Wende Bushy, MD  Coenzyme Q10 (COQ-10) 100 MG CAPS Take 1 tablet by mouth daily.    [provider]  ELIQUIS 5 MG TABS tablet take 1 tablet by mouth twice a day 02/05/17   Wende Bushy, MD  ezetimibe (ZETIA) 10 MG tablet take 1 tablet by mouth once daily 09/03/16   [name], MD   Fluticasone-Salmeterol (ADVAIR) 250-50 MCG/DOSE AEPB Inhale 1 puff into the lungs 2 (two) times daily as needed.    [provider]  folic acid (FOLVITE) 1 MG tablet take 1 tablet by mouth once daily 12/10/16   [name], MD  furosemide (LASIX) 20 MG tablet Take 0.5 tablets (10 mg total) by mouth daily. 10/11/16 01/09/17  Wende Bushy, MD  hydrocortisone 2.5 % cream apply to VULVA twice a day AT 10AM AND 4 PM 02/06/16   [name], MD  ipratropium (ATROVENT HFA) 17 MCG/ACT inhaler Inhale 1 puff into the lungs every 4 (four) hours as needed for wheezing.    [provider]  ketoconazole (  NIZORAL) 2 % cream Apply 1 application topically daily. Patient taking differently: Apply 1 application topically daily as needed for irritation.  06/15/14   [name], MD  loratadine (CLARITIN) 10 MG tablet Take 10 mg by mouth daily.    [provider]  meclizine (ANTIVERT) 25 MG tablet Take 25 mg by mouth 3 (three) times daily as needed for dizziness.    [provider]  miconazole (MICOTIN) 2 % cream Apply 1 application topically 2 (two) times daily as needed (irritation).    [provider]  Multiple Vitamins-Minerals (HAIR/SKIN/NAILS PO) Take 1 tablet by mouth daily.    [provider]  nitroGLYCERIN (NITROSTAT) 0.4 MG SL tablet Place 1 tablet (0.4 mg total) under the tongue every 5 (five) minutes as needed for chest pain. Place 1 tablet (0.4 mg total) under the tongue every 5 (five) minutes , 3 times as needed for chest pain. 06/07/16   Nicholes Mango, MD  Omega-3 Fatty Acids (FISH OIL PO) Take 2,000 mg by mouth daily.    [provider]  pantoprazole (PROTONIX) 40 MG tablet take 1 tablet by mouth twice a day 01/30/17   [name], MD  Polyethylene Glycol 3350 (MIRALAX PO) Take 8.5 g by mouth at bedtime.     [provider]  ramipril (ALTACE) 10 MG capsule take 1 capsule by mouth once daily 11/16/16   [name], MD    spironolactone (ALDACTONE) 25 MG tablet take 1 tablet by mouth every morning 02/28/17   [name], MD  traMADol (ULTRAM) 50 MG tablet Take 1 tablet (50 mg total) by mouth daily as needed. Patient taking differently: Take 50 mg by mouth daily as needed for moderate pain.  08/16/15   [name], MD  traZODone (DESYREL) 150 MG tablet take 1 and 2/3-2 tablets by mouth at bedtime as directed 11/29/16   [name], MD  verapamil (CALAN-SR) 180 MG CR tablet Take 1 tablet (180 mg total) by mouth at bedtime. Patient taking differently: Take 180 mg by mouth 2 (two) times daily.  10/10/16   Dustin Flock, MD  vitamin B-12 (CYANOCOBALAMIN) 1000 MCG tablet Take 1,000 mcg by mouth daily.    [provider]  vitamin C (ASCORBIC ACID) 500 MG tablet Take 500 mg by mouth at bedtime.    [provider]    Allergies:  Allergies  Allergen Reactions  . Beta Adrenergic Blockers Shortness Of Breath  . Albuterol     rigors  . Aspirin Other (See Comments)    Heartburn   . Penicillins Hives and Other (See Comments)    Has patient had a PCN reaction causing immediate rash, facial/tongue/throat swelling, SOB or lightheadedness with hypotension: No Has patient had a PCN reaction causing severe rash involving mucus membranes or skin necrosis: No Has patient had a PCN reaction that required hospitalization No Has patient had a PCN reaction occurring within the last 10 years: No If all of the above answers are "NO", then may proceed with Cephalosporin use.   . Statins Other (See Comments)    Muscle weakness  . Sulfa Antibiotics Rash    At mouth  . Tetracycline Rash  . Tetracyclines & Related Rash    Social History   Social History  . Marital status: Married    Spouse name: N/A  . Number of children: 3  . Years of education: N/A   Occupational History  . Retired Sport and exercise psychologist    Social History Main Topics  . Smoking status:  Former Smoker  . Smokeless tobacco: Never  Used     Comment: quit 44+ yrs ago  . Alcohol use No  . Drug use: No  . Sexual activity: No   Other Topics Concern  . Not on file   Social History Narrative   Lives in [place].  Retired Engineer, drilling.  Relatively active.     Family History  Problem Relation Age of Onset  . Thyroid disease Mother        graves disease  . Heart disease Father        rheumatic heart  . Alcohol abuse Father   . Depression Father   . Lung cancer Sister   . Depression Daughter   . Thyroid disease Daughter        hashimoto  . Depression Son   . Stroke Maternal Grandmother   . Depression Maternal Grandmother   . Hypertension Maternal Grandfather   . Hypertension Paternal Grandfather   . Breast cancer Neg Hx      ROS:  Please see the history of present illness.     All other systems reviewed and negative.    Physical Exam  Blood pressure 100/62, pulse 68, height 5\' 3"  (1.6 m), weight 186 lb 8 oz (84.6 kg). General: Well developed, well nourished female in no acute distress. Head: Normocephalic, atraumatic, sclera non-icteric, no xanthomas, nares are without discharge. EENT: normal  Lymph Nodes:  none Neck: Negative for carotid bruits. JVD not elevated. Back:without scoliosis marked kyphosis  Lungs: Clear bilaterally to auscultation without wheezes, rales, or rhonchi. Breathing is unlabored. Heart:i  Irregularly irregular RRR with S1 S2.  2/6 systolic  murmur . No rubs, or gallops appreciated. Abdomen: Soft, non-tender, non-distended with normoactive bowel sounds. No hepatomegaly. No rebound/guarding. No obvious abdominal masses. Msk:  Strength and tone appear normal for age. Extremities: No clubbing or cyanosis. L>R  1 + edema.  Distal pedal pulses are 2+ and equal bilaterally. Skin: Warm and Dry Neuro: Alert and oriented X 3. CN III-XII intact Grossly normal sensory and motor function . Psych:  Responds to questions appropriately with a normal affect.      Labs: Cardiac Enzymes No  results for input(s): CKTOTAL, CKMB, TROPONINI in the last 72 hours. CBC Lab Results  Component Value Date   WBC 5.3 10/10/2016   HGB 12.7 10/10/2016   HCT 36.1 10/10/2016   MCV 98.5 10/10/2016   PLT 265 10/10/2016   Date Cr K  4/18 0.95 4.5           Lipids Lab Results  Component Value Date   CHOL 195 01/07/2017   HDL 57.20 01/07/2017   LDLCALC 124 (H) 01/07/2017   TRIG 73.0 01/07/2017   Personally reviewed      EKG:  Atrial fibrillation at 70   Assessment and Plan:  Atrial fibrillation-persistent  Coronary artery disease status post CABG and recent stenting   Exercise intolerance/HFpEF  Bradycardia    The patient has persistent atrial fibrillation. Left atrial sizes are modestly increased. And this was noted at the onset of her atrial fibrillation suggesting long-standing atriopathy.  I am not sure about the ability to restore sinus rhythm but I would favor cardioversion.  She also has bradycardia. I'm not sure that this is facilitating cardiovascular performance so we will discontinue her bisoprolol and decrease her verapamil from 180 twice a day--180 daily. She may need augmented antihypertensive therapy  No bleeding issues on the apixoban. She is scheduled to come off of her  Plavix in a few months  Without symptoms of ischemia      Virl Axe

## 2017-03-08 NOTE — CV Procedure (Signed)
Cardioversion note: A standard informed consent was obtained. Timeout was performed. The pads were placed in the anterior posterior fashion. The patient was given propofol by the anesthesia team.  Successful cardioversion was performed with a 200 J. she remained in sinus rhythm for 1 minute and then converted back to atrial fibrillation. Another 200 J shock was performed with restoration of sinus rhythm. Pre-and post EKGs were reviewed. The patient tolerated the procedure with no immediate complications.  Recommendations: Continue same medications and follow-up in 2-3 weeks.

## 2017-03-08 NOTE — Anesthesia Postprocedure Evaluation (Signed)
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) Performed: Procedure(s) (LRB): CARDIOVERSION (N/A)  Patient location during evaluation: Other Anesthesia Type: General Level of consciousness: awake and alert and oriented Pain management: pain level controlled Vital Signs Assessment: post-procedure vital signs reviewed and stable Respiratory status: spontaneous breathing Cardiovascular status: blood pressure returned to baseline Anesthetic complications: no     Last Vitals:  Vitals:   03/08/17 1000 03/08/17 1015  BP: 139/77 (!) 141/80  Pulse: 70 73  Resp: 15 20  Temp:      Last Pain:  Vitals:   03/08/17 0748  TempSrc: Oral                 Jamya Starry

## 2017-03-08 NOTE — Interval H&P Note (Signed)
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 After consideration of risks, benefits and other options for treatment, the patient has consented to  Procedure(s): CARDIOVERSION (N/A) as a surgical intervention .  The patient's history has been reviewed, patient examined, no change in status, stable for surgery.  I have reviewed the patient's chart and labs.  Questions were answered to the patient's satisfaction.     Kathlyn [place]

## 2017-03-08 NOTE — Anesthesia Procedure Notes (Signed)
Date/Time: 03/08/2017 9:16 AM Performed by: Doreen Salvage Pre-anesthesia Checklist: Patient identified, Emergency Drugs available, Suction available and Patient being monitored Patient Re-evaluated:Patient Re-evaluated prior to inductionOxygen Delivery Method: Nasal cannula Intubation Type: IV induction Dental Injury: Teeth and Oropharynx as per pre-operative assessment  Comments: Nasal cannula with etCO2 monitoring

## 2017-03-11 ENCOUNTER — Ambulatory Visit (INDEPENDENT_AMBULATORY_CARE_PROVIDER_SITE_OTHER): Payer: Medicare Other

## 2017-03-11 VITALS — BP 128/62 | HR 92 | Temp 98.4°F | Resp 14 | Ht 64.0 in | Wt 183.4 lb

## 2017-03-11 DIAGNOSIS — Z1231 Encounter for screening mammogram for malignant neoplasm of breast: Secondary | ICD-10-CM

## 2017-03-11 DIAGNOSIS — Z0000 Encounter for general adult medical examination without abnormal findings: Secondary | ICD-10-CM

## 2017-03-11 DIAGNOSIS — Z760 Encounter for issue of repeat prescription: Secondary | ICD-10-CM | POA: Diagnosis not present

## 2017-03-11 DIAGNOSIS — Z1239 Encounter for other screening for malignant neoplasm of breast: Secondary | ICD-10-CM

## 2017-03-11 NOTE — Progress Notes (Signed)
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  Review of Systems:  No ROS.  Medicare Wellness Visit.  Cardiac Risk Factors include: advanced age (>24men, >54 women);hypertension     Objective:     Vitals: BP 128/62 (BP Location: Left Arm, Patient Position: Sitting, Cuff Size: Normal)   Pulse 92   Temp 98.4 F (36.9 C) (Oral)   Resp 14   Ht 5\' 4"  (1.626 m)   Wt 183 lb 6.4 oz (83.2 kg)   SpO2 97%   BMI 31.48 kg/m   Body mass index is 31.48 kg/m.   Tobacco History  Smoking Status  . Former Smoker  Smokeless Tobacco  . Never Used    Comment: quit 44+ yrs ago     Counseling given: Not Answered   Past Medical History:  Diagnosis Date  . Allergy   . Arthritis    s/p bilateral knees and left hip replacement  . Asthma   . CAD (coronary artery disease)    a. 12/1996 s/p CABG x4 ([place]);  b. 2005 Pt reports stress test & cath, which revealed patent grafts.  . Cancer ([place])    melanoma right arm  . Carotid arterial disease ([place])    a. 04/2015 Carotid U/S: <50% bilat ICA stenosis.  . Chronic kidney disease   . Colon polyps    H/O  . Depression   . GERD (gastroesophageal reflux disease)    h/o hiatal hernia  . Headache    migraines in past  . Heart murmur    a. 04/2011 Echo: EF 55-60%, bilat atrial enlargement, mild to mod TR.  [name] History of chicken pox   . History of hiatal hernia   . Hx of migraines    rare now  . Hx: UTI (urinary tract infection)   . Hyperlipidemia    a. Statin intolerant -->on zetia.  . Hypertensive heart disease   . Lichen planus   . Melanoma ([place])   . Palpitations    a. rare PVC's and h/o SVT.  [name] PMR (polymyalgia rheumatica) (H[facility])    h/o in setting of crestor usage.  [name] PSVT (paroxysmal supraventricular tachycardia) ([place])   . PUD (peptic ulcer disease)    remote history  . Raynaud's phenomenon   . Spinal stenosis   . Urine incontinence    H/O  .  Vitamin D deficiency    Past Surgical History:  Procedure Laterality Date  . ADENOIDECTOMY     age 8  . BACK SURGERY     L3-L5  . BILATERAL CARPAL TUNNEL RELEASE    . BREAST BIOPSY Right    bx x 3 neg  . BREAST SURGERY Right    biopsy x 3 (all benign)  . CARDIAC CATHETERIZATION N/A 06/01/2016   Procedure: LEFT HEART CATH AND CORS/GRAFTS ANGIOGRAPHY;  Surgeon: Wellington Hampshire, MD;  Location: [facility];  Service: Cardiovascular;  Laterality: N/A;  . CARDIAC CATHETERIZATION N/A 06/01/2016   Procedure: Coronary Stent Intervention;  Surgeon: Wellington Hampshire, MD;  Location: [facility];  Service: Cardiovascular;  Laterality: N/A;  . CARDIOVERSION N/A 03/08/2017   Procedure: CARDIOVERSION;  Surgeon: Wellington Hampshire, MD;  Location: [facility];  Service: Cardiovascular;  Laterality: N/A;  . CATARACT EXTRACTION W/PHACO Right 01/04/2016   Procedure: CATARACT EXTRACTION PHACO AND INTRAOCULAR LENS PLACEMENT ([place]);  Surgeon: Leandrew Koyanagi, MD;  Location: [facility] [facility];  Service: Ophthalmology;  Laterality: Right;  MALYUGIN  . CATARACT EXTRACTION W/PHACO Left 01/25/2016   Procedure: CATARACT EXTRACTION PHACO AND INTRAOCULAR LENS PLACEMENT ([place]) left eye;  Surgeon: Leandrew Koyanagi, MD;  Location: [facility];  Service: Ophthalmology;  Laterality: Left;  MALYUGIN SHUGARCAINE  . CHOLECYSTECTOMY  90's  . COLONOSCOPY WITH PROPOFOL N/A 05/03/2015   Procedure: COLONOSCOPY WITH PROPOFOL;  Surgeon: Lollie Sails, MD;  Location: [facility];  Service: Endoscopy;  Laterality: N/A;  . CORONARY ARTERY BYPASS GRAFT  98   4 vessel  . ESOPHAGOGASTRODUODENOSCOPY N/A 03/22/2015   Procedure: ESOPHAGOGASTRODUODENOSCOPY (EGD);  Surgeon: Lollie Sails, MD;  Location[facility];  Service: Endoscopy;  Laterality: N/A;  . JOINT REPLACEMENT     BILATERAL KNEE REPLACEMENTS  . KNEE ARTHROSCOPY W/ OATS PROCEDURE     Lt knee (9/01), Rt knee (3/11), Lt hip (5/10)  . TOTAL  HIP ARTHROPLASTY     Family History  Problem Relation Age of Onset  . Thyroid disease Mother        graves disease  . Heart disease Father        rheumatic heart  . Alcohol abuse Father   . Depression Father   . Lung cancer Sister   . Depression Daughter   . Thyroid disease Daughter        hashimoto  . Depression Son   . Stroke Maternal Grandmother   . Depression Maternal Grandmother   . Hypertension Maternal Grandfather   . Hypertension Paternal Grandfather   . Seizures Son   . Breast cancer Neg Hx    History  Sexual Activity  . Sexual activity: No    Outpatient Encounter Prescriptions as of 03/11/2017  Medication Sig  . acetaminophen (TYLENOL) 500 MG tablet Take 1,000 mg by mouth 2 (two) times daily.   [name] amitriptyline (ELAVIL) 25 MG tablet Take 1-2 tablets (25-50 mg total) by mouth at bedtime. (Patient taking differently: Take 25 mg by mouth at bedtime. )  . b complex vitamins tablet Take 1 tablet by mouth daily.  . Cholecalciferol (VITAMIN D3) 2000 UNITS TABS Take 2,000 Units by mouth daily.   . clopidogrel (PLAVIX) 75 MG tablet take 1 tablet by mouth once daily  . Coenzyme Q10 (COQ-10) 100 MG CAPS Take 100 mg by mouth daily.   [name] ELIQUIS 5 MG TABS tablet take 1 tablet by mouth twice a day  . ezetimibe (ZETIA) 10 MG tablet take 1 tablet by mouth once daily (Patient taking differently: take 1 tablet by mouth once daily at bedtime)  . Fluticasone-Salmeterol (ADVAIR) 250-50 MCG/DOSE AEPB Inhale 1 puff into the lungs 2 (two) times daily as needed (for asthmatic bronchitis after colds.).   [name] folic acid (FOLVITE) 1 MG tablet take 1 tablet by mouth once daily  . furosemide (LASIX) 20 MG tablet Take 0.5 tablets (10 mg total) by mouth daily. (Patient taking differently: Take 20 mg by mouth daily as needed (for weight gain of 2 lbs or more). )  . hydrocortisone 2.5 % cream apply to VULVA twice a day AT 10AM AND 4 PM (Patient taking differently: apply to VULVA at bedtime as needed for  with miconazole for lichen planus)  . ipratropium (ATROVENT HFA) 17 MCG/ACT inhaler Inhale 1 puff into the lungs every 4 (four) hours as needed (for asthmatic bronchitis after colds.).   [name] ketoconazole (NIZORAL) 2 % cream Apply 1 application topically daily. (Patient taking differently: Apply 1 application topically daily as needed (for crack bleeding fingers.). )  . loratadine (CLARITIN)  10 MG tablet Take 10 mg by mouth daily.  . meclizine (ANTIVERT) 25 MG tablet Take 25 mg by mouth 3 (three) times daily as needed for dizziness.  . miconazole (MICOTIN) 2 % cream Apply 1 application topically at bedtime as needed (with hydrocortisone for lichen planus).   . Multiple Vitamins-Minerals (HAIR/SKIN/NAILS PO) Take 1 tablet by mouth daily.  . nitroGLYCERIN (NITROSTAT) 0.4 MG SL tablet Place 1 tablet (0.4 mg total) under the tongue every 5 (five) minutes as needed for chest pain. Place 1 tablet (0.4 mg total) under the tongue every 5 (five) minutes , 3 times as needed for chest pain.  . Omega-3 Fatty Acids (FISH OIL PO) Take 2,000 mg by mouth daily.  . pantoprazole (PROTONIX) 40 MG tablet take 1 tablet by mouth twice a day  . polyethylene glycol powder (GLYCOLAX/MIRALAX) powder Take 8.5 g by mouth once.  . ramipril (ALTACE) 10 MG capsule take 1 capsule by mouth once daily (Patient taking differently: take 1 capsule by mouth once at bedtime)  . spironolactone (ALDACTONE) 25 MG tablet take 1 tablet by mouth every morning  . traMADol (ULTRAM) 50 MG tablet Take 1 tablet (50 mg total) by mouth daily as needed. (Patient taking differently: Take 50 mg by mouth daily as needed for moderate pain. )  . traZODone (DESYREL) 150 MG tablet take 1 and 2/3-2 tablets by mouth at bedtime as directed (Patient taking differently: Take 1.5 tablets (225 mg) by mouth at bedtime.)  . verapamil (CALAN-SR) 180 MG CR tablet Take 1 tablet (180 mg total) by mouth at bedtime.  . vitamin B-12 (CYANOCOBALAMIN) 1000 MCG tablet Take 1,000  mcg by mouth daily.  . vitamin C (ASCORBIC ACID) 500 MG tablet Take 500 mg by mouth at bedtime.   No facility-administered encounter medications on file as of 03/11/2017.     Activities of Daily Living In your present state of health, do you have any difficulty performing the following activities: 03/11/2017 10/19/2016  Hearing? N N  Vision? N N  Difficulty concentrating or making decisions? Y N  Walking or climbing stairs? Y Y  Dressing or bathing? N N  Doing errands, shopping? N N  Preparing Food and eating ? N -  Using the Toilet? N -  In the past six months, have you accidently leaked urine? Y -  Do you have problems with loss of bowel control? N -  Managing your Medications? N -  Managing your Finances? N -  Housekeeping or managing your Housekeeping? N -  Some recent data might be hidden    Patient Care Team: [name], MD as PCP - General (Internal Medicine) Wende Bushy, MD as Consulting Physician (Cardiology)    Assessment:    This is a routine wellness examination for [place]. The goal of the wellness visit is to assist the patient how to close the gaps in care and create a preventative care plan for the patient.   Taking calcium VIT D as appropriate/Osteoporosis risk reviewed.  Medications reviewed; taking without issues or barriers.  Safety issues reviewed; smoke detectors in the home. No firearms in the home.  Wears seatbelts when driving or riding with others. Patient does wear sunscreen or protective clothing when in direct sunlight. No violence in the home.  Patient is alert, normal appearance, oriented to person/place/and time. Correctly identified the president of the [place], recall of 3/3 words, and performing simple calculations.  Patient displays appropriate judgement and can read correct time from watch face.  No  new identified risk were noted.  No failures at ADL's or IADL's. Cane in use when ambulating.   BMI- discussed the importance of a  healthy diet, water intake and exercise. Educational material provided.   Daily fluid intake: 3 cups of caffeine free diet coke, 2 cups of water  HTN- followed by PCP.  Dental- every six months.  Eye- Visual acuity not assessed per patient preference since they have regular follow up with the ophthalmologist.  Wears corrective lenses.  Sleep patterns- Sleeps 6-7 hours at night.    Mammogram ordered.  Follow as directed.  Patient Concerns: Back, hand, neck pain increased.  Tramadol refill ordered, deferred to PCP for approval.  Exercise Activities and Dietary recommendations Current Exercise Habits: Structured exercise class, Frequency (Times/Week): 4, Intensity: Moderate  Goals    . Healthy Lifestyle          Stay hydrated and drink plenty of fluids.  Increase water intake. Low carb foods.  Lean meats and vegetables. Stay active and continue exercise regimen.      Fall Risk Fall Risk  03/11/2017 10/19/2016 09/06/2016 06/21/2016 06/14/2016  Falls in the past year? No No No Yes No  Number falls in past yr: - - - 1 -  Injury with Fall? - - - Yes -  Risk Factor Category  - - - High Fall Risk -  Risk for fall due to : - Impaired balance/gait - Impaired balance/gait -  Follow up - - - Education provided -   Depression Screen PHQ 2/9 Scores 03/11/2017 11/19/2016 10/19/2016 09/06/2016  PHQ - 2 Score 1 1 1  0  PHQ- 9 Score 3 4 - -     Cognitive Function MMSE - Mini Mental State Exam 03/11/2017 02/23/2016  Orientation to time 5 5  Orientation to Place 5 5  Registration 3 3  Attention/ Calculation 5 5  Recall 3 3  Language- name 2 objects 2 2  Language- repeat 1 1  Language- follow 3 step command 3 3  Language- read & follow direction 1 1  Write a sentence 1 1  Copy design 1 1  Total score 30 30        Immunization History  Administered Date(s) Administered  . Influenza, High Dose Seasonal PF 06/14/2016  . Influenza,inj,Quad PF,36+ Mos 06/15/2014  . Influenza-Unspecified  07/13/2015  . Pneumococcal Conjugate-13 01/13/2015  . Pneumococcal Polysaccharide-23 06/21/2009  . Tdap 02/22/2010  . Zoster 11/15/2010   Screening Tests Health Maintenance  Topic Date Due  . MAMMOGRAM  03/06/2017  . DEXA SCAN  08/08/2017 (Originally 04/08/2003)  . INFLUENZA VACCINE  05/08/2017  . TETANUS/TDAP  02/23/2020  . COLONOSCOPY  05/02/2020  . PNA vac Low Risk Adult  Completed      Plan:    End of life planning; Advance aging; Advanced directives discussed. Copy of current HCPOA/Living Will requested.    I have personally reviewed and noted the following in the patient's chart:   . Medical and social history . Use of alcohol, tobacco or illicit drugs  . Current medications and supplements . Functional ability and status . Nutritional status . Physical activity . Advanced directives . List of other physicians . Hospitalizations, surgeries, and ER visits in previous 12 months . Vitals . Screenings to include cognitive, depression, and falls . Referrals and appointments  In addition, I have reviewed and discussed with patient certain preventive protocols, quality metrics, and best practice recommendations. A written personalized care plan for preventive services as well as general  preventive health recommendations were provided to patient.     Varney Biles, LPN  8/0/9983   Reviewed above information.  Agree with plan.  Pt had rx for tramadol #20 with no refills given to her approximately two years ago.  Just not completing rx.  Ok to refill x 1.    Dr Nicki Reaper

## 2017-03-11 NOTE — Patient Instructions (Addendum)
  Charlotte Wagner , Thank you for taking time to come for your Medicare Wellness Visit. I appreciate your ongoing commitment to your health goals. Please review the following plan we discussed and let me know if I can assist you in the future.   Follow up with Dr. Nicki Reaper as needed.    Bring a copy of your [place] and/or Living Will to be scanned into chart.  Mammogram appointment.  Have a great day!  These are the goals we discussed: Goals    . Healthy Lifestyle          Stay hydrated and drink plenty of fluids.  Increase water intake. Low carb foods.  Lean meats and vegetables. Stay active and continue exercise regimen.       This is a list of the screening recommended for you and due dates:  Health Maintenance  Topic Date Due  . Mammogram  03/06/2017  . DEXA scan (bone density measurement)  08/08/2017*  . Flu Shot  05/08/2017  . Tetanus Vaccine  02/23/2020  . Colon Cancer Screening  05/02/2020  . Pneumonia vaccines  Completed  *Topic was postponed. The date shown is not the original due date.

## 2017-03-12 ENCOUNTER — Telehealth: Payer: Self-pay | Admitting: *Deleted

## 2017-03-12 ENCOUNTER — Observation Stay
Admission: EM | Admit: 2017-03-12 | Discharge: 2017-03-13 | Disposition: A | Discharge status: Nursing Home (Includes ICF) | Payer: Medicare Other | Attending: Internal Medicine | Admitting: Internal Medicine

## 2017-03-12 ENCOUNTER — Encounter: Payer: Self-pay | Admitting: Emergency Medicine

## 2017-03-12 ENCOUNTER — Observation Stay: Payer: Medicare Other

## 2017-03-12 ENCOUNTER — Emergency Department: Payer: Medicare Other

## 2017-03-12 DIAGNOSIS — R0600 Dyspnea, unspecified: Secondary | ICD-10-CM | POA: Diagnosis present

## 2017-03-12 DIAGNOSIS — R29701 NIHSS score 1: Secondary | ICD-10-CM | POA: Insufficient documentation

## 2017-03-12 DIAGNOSIS — Z881 Allergy status to other antibiotic agents status: Secondary | ICD-10-CM | POA: Insufficient documentation

## 2017-03-12 DIAGNOSIS — R410 Disorientation, unspecified: Secondary | ICD-10-CM

## 2017-03-12 DIAGNOSIS — I472 Ventricular tachycardia, unspecified: Secondary | ICD-10-CM

## 2017-03-12 DIAGNOSIS — I4819 Other persistent atrial fibrillation: Secondary | ICD-10-CM

## 2017-03-12 DIAGNOSIS — I5032 Chronic diastolic (congestive) heart failure: Secondary | ICD-10-CM | POA: Insufficient documentation

## 2017-03-12 DIAGNOSIS — M419 Scoliosis, unspecified: Secondary | ICD-10-CM | POA: Insufficient documentation

## 2017-03-12 DIAGNOSIS — I7300 Raynaud's syndrome without gangrene: Secondary | ICD-10-CM | POA: Insufficient documentation

## 2017-03-12 DIAGNOSIS — Z87891 Personal history of nicotine dependence: Secondary | ICD-10-CM | POA: Insufficient documentation

## 2017-03-12 DIAGNOSIS — Z96642 Presence of left artificial hip joint: Secondary | ICD-10-CM | POA: Diagnosis not present

## 2017-03-12 DIAGNOSIS — Z9049 Acquired absence of other specified parts of digestive tract: Secondary | ICD-10-CM | POA: Insufficient documentation

## 2017-03-12 DIAGNOSIS — Z85820 Personal history of malignant melanoma of skin: Secondary | ICD-10-CM | POA: Insufficient documentation

## 2017-03-12 DIAGNOSIS — Z66 Do not resuscitate: Secondary | ICD-10-CM | POA: Diagnosis not present

## 2017-03-12 DIAGNOSIS — G459 Transient cerebral ischemic attack, unspecified: Principal | ICD-10-CM | POA: Insufficient documentation

## 2017-03-12 DIAGNOSIS — I6523 Occlusion and stenosis of bilateral carotid arteries: Secondary | ICD-10-CM | POA: Insufficient documentation

## 2017-03-12 DIAGNOSIS — G454 Transient global amnesia: Secondary | ICD-10-CM

## 2017-03-12 DIAGNOSIS — F329 Major depressive disorder, single episode, unspecified: Secondary | ICD-10-CM | POA: Insufficient documentation

## 2017-03-12 DIAGNOSIS — Z87440 Personal history of urinary (tract) infections: Secondary | ICD-10-CM | POA: Insufficient documentation

## 2017-03-12 DIAGNOSIS — K449 Diaphragmatic hernia without obstruction or gangrene: Secondary | ICD-10-CM | POA: Insufficient documentation

## 2017-03-12 DIAGNOSIS — Z955 Presence of coronary angioplasty implant and graft: Secondary | ICD-10-CM | POA: Diagnosis not present

## 2017-03-12 DIAGNOSIS — I10 Essential (primary) hypertension: Secondary | ICD-10-CM | POA: Diagnosis present

## 2017-03-12 DIAGNOSIS — Z961 Presence of intraocular lens: Secondary | ICD-10-CM | POA: Insufficient documentation

## 2017-03-12 DIAGNOSIS — E559 Vitamin D deficiency, unspecified: Secondary | ICD-10-CM | POA: Diagnosis not present

## 2017-03-12 DIAGNOSIS — I110 Hypertensive heart disease with heart failure: Secondary | ICD-10-CM | POA: Insufficient documentation

## 2017-03-12 DIAGNOSIS — K219 Gastro-esophageal reflux disease without esophagitis: Secondary | ICD-10-CM | POA: Insufficient documentation

## 2017-03-12 DIAGNOSIS — I25118 Atherosclerotic heart disease of native coronary artery with other forms of angina pectoris: Secondary | ICD-10-CM | POA: Diagnosis present

## 2017-03-12 DIAGNOSIS — F419 Anxiety disorder, unspecified: Secondary | ICD-10-CM | POA: Diagnosis present

## 2017-03-12 DIAGNOSIS — Z7902 Long term (current) use of antithrombotics/antiplatelets: Secondary | ICD-10-CM | POA: Insufficient documentation

## 2017-03-12 DIAGNOSIS — I481 Persistent atrial fibrillation: Secondary | ICD-10-CM | POA: Diagnosis not present

## 2017-03-12 DIAGNOSIS — Z96653 Presence of artificial knee joint, bilateral: Secondary | ICD-10-CM | POA: Diagnosis not present

## 2017-03-12 DIAGNOSIS — Z951 Presence of aortocoronary bypass graft: Secondary | ICD-10-CM | POA: Insufficient documentation

## 2017-03-12 DIAGNOSIS — E785 Hyperlipidemia, unspecified: Secondary | ICD-10-CM | POA: Diagnosis not present

## 2017-03-12 DIAGNOSIS — I4891 Unspecified atrial fibrillation: Secondary | ICD-10-CM | POA: Diagnosis present

## 2017-03-12 DIAGNOSIS — R29818 Other symptoms and signs involving the nervous system: Secondary | ICD-10-CM | POA: Diagnosis not present

## 2017-03-12 DIAGNOSIS — R000 Tachycardia, unspecified: Secondary | ICD-10-CM | POA: Diagnosis not present

## 2017-03-12 DIAGNOSIS — Z880 Allergy status to penicillin: Secondary | ICD-10-CM | POA: Insufficient documentation

## 2017-03-12 DIAGNOSIS — I2510 Atherosclerotic heart disease of native coronary artery without angina pectoris: Secondary | ICD-10-CM | POA: Insufficient documentation

## 2017-03-12 DIAGNOSIS — R413 Other amnesia: Secondary | ICD-10-CM

## 2017-03-12 DIAGNOSIS — Z79899 Other long term (current) drug therapy: Secondary | ICD-10-CM | POA: Insufficient documentation

## 2017-03-12 DIAGNOSIS — Z886 Allergy status to analgesic agent status: Secondary | ICD-10-CM | POA: Insufficient documentation

## 2017-03-12 DIAGNOSIS — Z7901 Long term (current) use of anticoagulants: Secondary | ICD-10-CM | POA: Insufficient documentation

## 2017-03-12 DIAGNOSIS — Z888 Allergy status to other drugs, medicaments and biological substances status: Secondary | ICD-10-CM | POA: Insufficient documentation

## 2017-03-12 LAB — URINALYSIS, COMPLETE (UACMP) WITH MICROSCOPIC
BACTERIA UA: NONE SEEN
Bilirubin Urine: NEGATIVE
GLUCOSE, UA: NEGATIVE mg/dL
Hgb urine dipstick: NEGATIVE
KETONES UR: NEGATIVE mg/dL
LEUKOCYTES UA: NEGATIVE
Nitrite: NEGATIVE
PROTEIN: NEGATIVE mg/dL
SQUAMOUS EPITHELIAL / LPF: NONE SEEN
Specific Gravity, Urine: 1.004 — ABNORMAL LOW (ref 1.005–1.030)
pH: 7 (ref 5.0–8.0)

## 2017-03-12 LAB — URINE DRUG SCREEN, QUALITATIVE (ARMC ONLY)
AMPHETAMINES, UR SCREEN: NOT DETECTED
BENZODIAZEPINE, UR SCRN: NOT DETECTED
Barbiturates, Ur Screen: NOT DETECTED
COCAINE METABOLITE, UR ~~LOC~~: NOT DETECTED
Cannabinoid 50 Ng, Ur ~~LOC~~: NOT DETECTED
MDMA (ECSTASY) UR SCREEN: NOT DETECTED
Methadone Scn, Ur: NOT DETECTED
OPIATE, UR SCREEN: NOT DETECTED
Phencyclidine (PCP) Ur S: NOT DETECTED
Tricyclic, Ur Screen: POSITIVE — AB

## 2017-03-12 LAB — COMPREHENSIVE METABOLIC PANEL
ALBUMIN: 4.5 g/dL (ref 3.5–5.0)
ALT: 22 U/L (ref 14–54)
AST: 41 U/L (ref 15–41)
Alkaline Phosphatase: 60 U/L (ref 38–126)
Anion gap: 9 (ref 5–15)
BUN: 20 mg/dL (ref 6–20)
CHLORIDE: 99 mmol/L — AB (ref 101–111)
CO2: 25 mmol/L (ref 22–32)
CREATININE: 0.92 mg/dL (ref 0.44–1.00)
Calcium: 9.7 mg/dL (ref 8.9–10.3)
GFR calc Af Amer: >60 mL/min (ref >60)
GFR, EST NON AFRICAN AMERICAN: 58 mL/min — AB (ref >60)
GLUCOSE: 98 mg/dL (ref 65–99)
POTASSIUM: 4.5 mmol/L (ref 3.5–5.1)
Sodium: 133 mmol/L — ABNORMAL LOW (ref 135–145)
Total Bilirubin: 1.5 mg/dL — ABNORMAL HIGH (ref 0.3–1.2)
Total Protein: 7.3 g/dL (ref 6.5–8.1)

## 2017-03-12 LAB — CBC
HEMATOCRIT: 43.4 % (ref 35.0–47.0)
Hemoglobin: 14.9 g/dL (ref 12.0–16.0)
MCH: 33.5 pg (ref 26.0–34.0)
MCHC: 34.3 g/dL (ref 32.0–36.0)
MCV: 97.6 fL (ref 80.0–100.0)
PLATELETS: 222 10*3/uL (ref 150–440)
RBC: 4.44 MIL/uL (ref 3.80–5.20)
RDW: 13.4 % (ref 11.5–14.5)
WBC: 4.3 10*3/uL (ref 3.6–11.0)

## 2017-03-12 LAB — PROTIME-INR
INR: 1.13
Prothrombin Time: 14.6 seconds (ref 11.4–15.2)

## 2017-03-12 LAB — TSH: TSH: 1.473 u[IU]/mL (ref 0.350–4.500)

## 2017-03-12 LAB — APTT: aPTT: 35 seconds (ref 24–36)

## 2017-03-12 LAB — MAGNESIUM: Magnesium: 2 mg/dL (ref 1.7–2.4)

## 2017-03-12 LAB — TROPONIN I: Troponin I: 0.04 ng/mL (ref <0.03)

## 2017-03-12 LAB — ETHANOL: Alcohol, Ethyl (B): <5 mg/dL (ref <5)

## 2017-03-12 MED ORDER — ACETAMINOPHEN 325 MG PO TABS: 650.0000 mg | ORAL_TABLET | Freq: Four times a day (QID) | ORAL | Status: DC | PRN

## 2017-03-12 MED ORDER — BISOPROLOL FUMARATE 5 MG PO TABS
2.5000 mg | ORAL_TABLET | Freq: Every day | ORAL | Status: DC
  Filled 2017-03-12 (×2): qty 0.5

## 2017-03-12 MED ORDER — FOLIC ACID 1 MG PO TABS
1.0000 mg | ORAL_TABLET | Freq: Every day | ORAL | Status: DC
  Administered 2017-03-13: 1 mg via ORAL
  Filled 2017-03-12: qty 1

## 2017-03-12 MED ORDER — VERAPAMIL HCL ER 180 MG PO TBCR: 180.0000 mg | EXTENDED_RELEASE_TABLET | Freq: Every day | ORAL | Status: DC

## 2017-03-12 MED ORDER — EZETIMIBE 10 MG PO TABS
10.0000 mg | ORAL_TABLET | Freq: Every day | ORAL | Status: DC
  Administered 2017-03-13: 10 mg via ORAL
  Filled 2017-03-12: qty 1

## 2017-03-12 MED ORDER — SPIRONOLACTONE 25 MG PO TABS
25.0000 mg | ORAL_TABLET | Freq: Every morning | ORAL | Status: DC
  Administered 2017-03-13: 10:00:00 25 mg via ORAL
  Filled 2017-03-12: qty 1

## 2017-03-12 MED ORDER — VITAMIN D 1000 UNITS PO TABS
2000.0000 [IU] | ORAL_TABLET | Freq: Every day | ORAL | Status: DC
  Administered 2017-03-13: 2000 [IU] via ORAL
  Filled 2017-03-12: qty 2

## 2017-03-12 MED ORDER — VERAPAMIL HCL ER 180 MG PO TBCR
180.0000 mg | EXTENDED_RELEASE_TABLET | Freq: Two times a day (BID) | ORAL | Status: DC
  Administered 2017-03-12 – 2017-03-13 (×2): 180 mg via ORAL
  Filled 2017-03-12 (×2): qty 1

## 2017-03-12 MED ORDER — HYDRALAZINE HCL 20 MG/ML IJ SOLN: 10.0000 mg | Freq: Four times a day (QID) | INTRAMUSCULAR | Status: DC | PRN

## 2017-03-12 MED ORDER — RAMIPRIL 10 MG PO CAPS
10.0000 mg | ORAL_CAPSULE | Freq: Every day | ORAL | Status: DC
  Administered 2017-03-12 – 2017-03-13 (×2): 10 mg via ORAL
  Filled 2017-03-12 (×2): qty 1

## 2017-03-12 MED ORDER — AMITRIPTYLINE HCL 10 MG PO TABS
25.0000 mg | ORAL_TABLET | Freq: Every day | ORAL | Status: DC
  Administered 2017-03-12: 25 mg via ORAL
  Filled 2017-03-12: qty 1
  Filled 2017-03-12: qty 2

## 2017-03-12 MED ORDER — ACETAMINOPHEN 500 MG PO TABS
1000.0000 mg | ORAL_TABLET | Freq: Once | ORAL | Status: AC
Start: 2017-03-12 — End: 2017-03-12
  Administered 2017-03-12: 1000 mg via ORAL
  Filled 2017-03-12: qty 2

## 2017-03-12 MED ORDER — CLOPIDOGREL BISULFATE 75 MG PO TABS
75.0000 mg | ORAL_TABLET | Freq: Every day | ORAL | Status: DC
Start: 2017-03-12 — End: 2017-03-13
  Administered 2017-03-13: 75 mg via ORAL
  Filled 2017-03-12: qty 1

## 2017-03-12 MED ORDER — APIXABAN 5 MG PO TABS
5.0000 mg | ORAL_TABLET | Freq: Two times a day (BID) | ORAL | Status: DC
  Administered 2017-03-12 – 2017-03-13 (×2): 5 mg via ORAL
  Filled 2017-03-12 (×2): qty 1

## 2017-03-12 MED ORDER — ACETAMINOPHEN 650 MG RE SUPP: 650.0000 mg | Freq: Four times a day (QID) | RECTAL | Status: DC | PRN

## 2017-03-12 MED ORDER — NICARDIPINE HCL IN NACL 20-0.86 MG/200ML-% IV SOLN
3.0000 mg/h | Freq: Once | INTRAVENOUS | Status: AC
  Administered 2017-03-12: 5 mg/h via INTRAVENOUS
  Filled 2017-03-12: qty 200

## 2017-03-12 MED ORDER — OMEGA-3-ACID ETHYL ESTERS 1 G PO CAPS
1.0000 g | ORAL_CAPSULE | Freq: Every day | ORAL | Status: DC
  Administered 2017-03-13: 1 g via ORAL
  Filled 2017-03-12: qty 1

## 2017-03-12 MED ORDER — ONDANSETRON HCL 4 MG/2ML IJ SOLN: 4.0000 mg | Freq: Four times a day (QID) | INTRAMUSCULAR | Status: DC | PRN

## 2017-03-12 MED ORDER — TRAMADOL HCL 50 MG PO TABS: 50.0000 mg | ORAL_TABLET | Freq: Every day | ORAL | 0 refills | Status: DC | PRN

## 2017-03-12 MED ORDER — ONDANSETRON HCL 4 MG PO TABS: 4.0000 mg | ORAL_TABLET | Freq: Four times a day (QID) | ORAL | Status: DC | PRN

## 2017-03-12 MED ORDER — PANTOPRAZOLE SODIUM 40 MG PO TBEC
40.0000 mg | DELAYED_RELEASE_TABLET | Freq: Two times a day (BID) | ORAL | Status: DC
  Administered 2017-03-12 – 2017-03-13 (×2): 40 mg via ORAL
  Filled 2017-03-12 (×2): qty 1

## 2017-03-12 MED ORDER — TRAZODONE HCL 100 MG PO TABS
225.0000 mg | ORAL_TABLET | Freq: Every day | ORAL | Status: DC
  Administered 2017-03-12: 225 mg via ORAL
  Filled 2017-03-12: qty 4

## 2017-03-12 NOTE — H&P (Signed)
[facility] NAME: Charlotte Wagner    MR#:  [id number]  DATE OF BIRTH:  07-24-1936  DATE OF ADMISSION:  03/12/2017  PRIMARY CARE PHYSICIAN: [name], MD   REQUESTING/REFERRING PHYSICIAN: Dr. Rudene Re  CHIEF COMPLAINT:   Chief Complaint  Patient presents with  . Altered Mental Status    HISTORY OF PRESENT ILLNESS:  Charlotte Wagner  is a 81 y.o. female with a known history of CAD status post CABG, arthritis, GERD, hypertension, CKD with the resident of [facility] assisted living presents to the hospital secondary to acute confusion this afternoon. 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 She is usually very sharp and independent at baseline. Her mobility is limited more from her scoliosis and also she was symptomatic with her persistent atrial fibrillation. She has been on eliquis and also Plavix. She has recently seen her cardiologist and had a successful cardioversion done on 03/08/2017, postprocedure EKG shows normal sinus rhythm with some atrial premature complexes. Patient was noted to be confused at around 4:00 this afternoon. Last known to be normal last night as one of her friends spoke with her. Patient has no memory of her recent cardioversion are off any events that happened in the last few days including up until she came to the hospital. She seems very confused of why she is in the hospital. After a few minutes she asks the same question again. Her friends were at bedside to mention that she is usually sharp and never like this. She denies any other neurological complaints. No tingling or numbness or weakness. No speech changes noted, no word finding difficulty or swallowing changes. CT of the head is negative at this time. She is noted to be in atrial fibrillation again.  PAST MEDICAL HISTORY:   Past Medical History:  Diagnosis Date  . Allergy   . Arthritis    s/p bilateral knees and left hip replacement  . Asthma     . CAD (coronary artery disease)    a. 12/1996 s/p CABG x4 ([place]);  b. 2005 Pt reports stress test & cath, which revealed patent grafts.  . Cancer ([place])    melanoma right arm  . Carotid arterial disease ([place])    a. 04/2015 Carotid U/S: <50% bilat ICA stenosis.  . Chronic kidney disease   . Colon polyps    H/O  . Depression   . GERD (gastroesophageal reflux disease)    h/o hiatal hernia  . Headache    migraines in past  . Heart murmur    a. 04/2011 Echo: EF 55-60%, bilat atrial enlargement, mild to mod TR.  [name] History of chicken pox   . History of hiatal hernia   . Hx of migraines    rare now  . Hx: UTI (urinary tract infection)   . Hyperlipidemia    a. Statin intolerant -->on zetia.  . Hypertensive heart disease   . Lichen planus   . Melanoma ([place])   . Palpitations    a. rare PVC's and h/o SVT.  [name] PMR (polymyalgia rheumatica) ([facility])    h/o in setting of crestor usage.  [name] PSVT (paroxysmal supraventricular tachycardia) ([place])   . PUD (peptic ulcer disease)    remote history  . Raynaud's phenomenon   . Spinal stenosis   . Urine incontinence    H/O  . Vitamin D deficiency     PAST SURGICAL HISTORY:   Past Surgical History:  Procedure Laterality Date  .  ADENOIDECTOMY     age 6  . BACK SURGERY     L3-L5  . BILATERAL CARPAL TUNNEL RELEASE    . BREAST BIOPSY Right    bx x 3 neg  . BREAST SURGERY Right    biopsy x 3 (all benign)  . CARDIAC CATHETERIZATION N/A 06/01/2016   Procedure: LEFT HEART CATH AND CORS/GRAFTS ANGIOGRAPHY;  Surgeon: Wellington Hampshire, MD;  Location: [place] [facility];  Service: Cardiovascular;  Laterality: N/A;  . CARDIAC CATHETERIZATION N/A 06/01/2016   Procedure: Coronary Stent Intervention;  Surgeon: Wellington Hampshire, MD;  Location: [facility];  Service: Cardiovascular;  Laterality: N/A;  . CARDIOVERSION N/A 03/08/2017   Procedure: CARDIOVERSION;  Surgeon: Wellington Hampshire, MD;  Location: [facility];  Service: Cardiovascular;   Laterality: N/A;  . CATARACT EXTRACTION W/PHACO Right 01/04/2016   Procedure: CATARACT EXTRACTION PHACO AND INTRAOCULAR LENS PLACEMENT ([place]);  Surgeon: Leandrew Koyanagi, MD;  Location: [facility];  Service: Ophthalmology;  Laterality: Right;  MALYUGIN  . CATARACT EXTRACTION W/PHACO Left 01/25/2016   Procedure: CATARACT EXTRACTION PHACO AND INTRAOCULAR LENS PLACEMENT ([place]) left eye;  Surgeon: Leandrew Koyanagi, MD;  Location: [facility] [place] [facility];  Service: Ophthalmology;  Laterality: Left;  MALYUGIN SHUGARCAINE  . CHOLECYSTECTOMY  90's  . COLONOSCOPY WITH PROPOFOL N/A 05/03/2015   Procedure: COLONOSCOPY WITH PROPOFOL;  Surgeon: Lollie Sails, MD;  Location: [facility];  Service: Endoscopy;  Laterality: N/A;  . CORONARY ARTERY BYPASS GRAFT  98   4 vessel  . ESOPHAGOGASTRODUODENOSCOPY N/A 03/22/2015   Procedure: ESOPHAGOGASTRODUODENOSCOPY (EGD);  Surgeon: Lollie Sails, MD;  Location: [facility] ENDOSCOPY;  Service: Endoscopy;  Laterality: N/A;  . JOINT REPLACEMENT     BILATERAL KNEE REPLACEMENTS  . KNEE ARTHROSCOPY W/ OATS PROCEDURE     Lt knee (9/01), Rt knee (3/11), Lt hip (5/10)  . TOTAL HIP ARTHROPLASTY      SOCIAL HISTORY:   Social History  Substance Use Topics  . Smoking status: Former Research scientist (life sciences)  . Smokeless tobacco: Never Used     Comment: quit 44+ yrs ago  . Alcohol use No    FAMILY HISTORY:   Family History  Problem Relation Age of Onset  . Thyroid disease Mother        graves disease  . Heart disease Father        rheumatic heart  . Alcohol abuse Father   . Depression Father   . Lung cancer Sister   . Depression Daughter   . Thyroid disease Daughter        hashimoto  . Depression Son   . Stroke Maternal Grandmother   . Depression Maternal Grandmother   . Hypertension Maternal Grandfather   . Hypertension Paternal Grandfather   . Seizures Son   . Breast cancer Neg Hx     DRUG ALLERGIES:   Allergies  Allergen Reactions  . Beta Adrenergic  Blockers Shortness Of Breath    DEPRESSION/HYPOTENSION  . Brilinta [Ticagrelor] Shortness Of Breath    Caused AFIB  . Albuterol     rigors  . Aspirin Other (See Comments)    Heartburn   . Nsaids Other (See Comments)    ULCER HISTORY & CURRENTLY ON BLOOD THINNERS  . Penicillins Hives and Other (See Comments)    Has patient had a PCN reaction causing immediate rash, facial/tongue/throat swelling, SOB or lightheadedness with hypotension: No Has patient had a PCN reaction causing severe rash involving mucus membranes or skin necrosis: No Has patient had a  PCN reaction that required hospitalization No Has patient had a PCN reaction occurring within the last 10 years: No If all of the above answers are "NO", then may proceed with Cephalosporin use.   . Statins Other (See Comments)    Muscle weakness  . Sulfa Antibiotics Rash    At mouth  . Tetracycline Rash  . Tetracyclines & Related Rash    REVIEW OF SYSTEMS:   Review of Systems  Constitutional: Negative for chills, fever, malaise/fatigue and weight loss.  HENT: Negative for ear discharge, ear pain, hearing loss, nosebleeds and tinnitus.   Eyes: Negative for blurred vision, double vision and photophobia.  Respiratory: Negative for cough, hemoptysis, shortness of breath and wheezing.   Cardiovascular: Negative for chest pain, palpitations, orthopnea and leg swelling.  Gastrointestinal: Negative for abdominal pain, constipation, diarrhea, heartburn, melena, nausea and vomiting.  Genitourinary: Negative for dysuria, frequency, hematuria and urgency.  Musculoskeletal: Negative for back pain, myalgias and neck pain.  Skin: Negative for rash.  Neurological: Negative for dizziness, tingling, tremors, sensory change, speech change, focal weakness and headaches.       Amnesia  Endo/Heme/Allergies: Does not bruise/bleed easily.  Psychiatric/Behavioral: Negative for depression.    MEDICATIONS AT HOME:   Prior to Admission medications     Medication Sig Start Date End Date Taking? Authorizing Provider  acetaminophen (TYLENOL) 500 MG tablet Take 1,000 mg by mouth 2 (two) times daily.    Yes [provider]  amitriptyline (ELAVIL) 25 MG tablet Take 1-2 tablets (25-50 mg total) by mouth at bedtime. Patient taking differently: Take 25 mg by mouth at bedtime.  09/06/16  Yes [name], MD  b complex vitamins tablet Take 1 tablet by mouth daily.   Yes [provider]  bisoprolol (ZEBETA) 5 MG tablet Take 2.5 mg by mouth at bedtime.   Yes [provider]  Cholecalciferol (VITAMIN D3) 2000 UNITS TABS Take 2,000 Units by mouth daily.    Yes [provider]  clopidogrel (PLAVIX) 75 MG tablet take 1 tablet by mouth once daily 02/05/17  Yes Ingal, Luanna Cole, MD  Coenzyme Q10 (COQ-10) 100 MG CAPS Take 100 mg by mouth daily.    Yes [provider]  ELIQUIS 5 MG TABS tablet take 1 tablet by mouth twice a day 02/05/17  Yes Wende Bushy, MD  ezetimibe (ZETIA) 10 MG tablet take 1 tablet by mouth once daily 09/03/16  Yes [name], MD  Fluticasone-Salmeterol (ADVAIR) 250-50 MCG/DOSE AEPB Inhale 1 puff into the lungs 2 (two) times daily as needed (for asthmatic bronchitis after colds.).    Yes [provider]  folic acid (FOLVITE) 1 MG tablet take 1 tablet by mouth once daily 12/10/16  Yes [name], MD  furosemide (LASIX) 20 MG tablet Take 0.5 tablets (10 mg total) by mouth daily. Patient taking differently: Take 20 mg by mouth daily as needed (for weight gain of 2 lbs or more).  10/11/16 03/06/18 Yes Wende Bushy, MD  hydrocortisone 2.5 % cream apply to VULVA twice a day AT 10AM AND 4 PM Patient taking differently: apply to VULVA at bedtime as needed for with miconazole for lichen planus 04/12/63  Yes [name], MD  ipratropium (ATROVENT HFA) 17 MCG/ACT inhaler Inhale 1 puff into the lungs every 4 (four) hours as needed (for asthmatic bronchitis after colds.).    Yes [provider]  ketoconazole (NIZORAL) 2 % cream Apply 1 application topically daily. Patient taking differently: Apply 1 application topically daily as needed (for  crack bleeding fingers.).  06/15/14  Yes [name], MD  loratadine (CLARITIN) 10 MG tablet Take 10 mg by mouth daily.   Yes [provider]  meclizine (ANTIVERT) 25 MG tablet Take 25 mg by mouth 3 (three) times daily as needed for dizziness.   Yes [provider]  miconazole (MICOTIN) 2 % cream Apply 1 application topically at bedtime as needed (with hydrocortisone for lichen planus).    Yes [provider]  Multiple Vitamins-Minerals (HAIR/SKIN/NAILS PO) Take 1 tablet by mouth daily.   Yes [provider]  nitroGLYCERIN (NITROSTAT) 0.4 MG SL tablet Place 1 tablet (0.4 mg total) under the tongue every 5 (five) minutes as needed for chest pain. Place 1 tablet (0.4 mg total) under the tongue every 5 (five) minutes , 3 times as needed for chest pain. 06/07/16  Yes Gouru, Illene Silver, MD  Omega-3 Fatty Acids (FISH OIL PO) Take 2,000 mg by mouth daily.   Yes [provider]  pantoprazole (PROTONIX) 40 MG tablet take 1 tablet by mouth twice a day 01/30/17  Yes [name], MD  polyethylene glycol powder (GLYCOLAX/MIRALAX) powder Take 8.5 g by mouth once.   Yes [provider]  ramipril (ALTACE) 10 MG capsule take 1 capsule by mouth once daily Patient taking differently: take 1 capsule by mouth once at bedtime 11/16/16  Yes [name], MD  spironolactone (ALDACTONE) 25 MG tablet take 1 tablet by mouth every morning 02/28/17  Yes [name], MD  traMADol (ULTRAM) 50 MG tablet Take 1 tablet (50 mg total) by mouth daily as needed. 03/12/17  Yes [name], MD  traZODone (DESYREL) 150 MG tablet take 1 and 2/3-2 tablets by mouth at bedtime as directed Patient taking differently: Take 1.5 tablets (225 mg) by mouth at bedtime. 11/29/16  Yes [name], MD  verapamil (CALAN-SR) 180 MG  CR tablet Take 1 tablet (180 mg total) by mouth at bedtime. Patient taking differently: Take 180 mg by mouth 2 (two) times daily.  02/28/17  Yes Deboraha Sprang, MD  vitamin B-12 (CYANOCOBALAMIN) 1000 MCG tablet Take 1,000 mcg by mouth daily.   Yes [provider]  vitamin C (ASCORBIC ACID) 500 MG tablet Take 500 mg by mouth at bedtime.   Yes [provider]      VITAL SIGNS:  Blood pressure (!) 142/74, pulse (!) 121, temperature 98.7 F (37.1 C), resp. rate (!) 22, height 5\' 4"  (1.626 m), weight 83 kg (183 lb), SpO2 98 %.  PHYSICAL EXAMINATION:   Physical Exam  GENERAL:  81 y.o.-year-old patient lying in the bed with no acute distress.  EYES: Pupils equal, round, reactive to light and accommodation. No scleral icterus. Extraocular muscles intact.  HEENT: Head atraumatic, normocephalic. Oropharynx and nasopharynx clear.  NECK:  Supple, no jugular venous distention. No thyroid enlargement, no tenderness.  LUNGS: Normal breath sounds bilaterally, no wheezing, rales,rhonchi or crepitation. No use of accessory muscles of respiration.  CARDIOVASCULAR: S1, S2 normal. No murmurs, rubs, or gallops.  ABDOMEN: Soft, nontender, nondistended. Bowel sounds present. No organomegaly or mass.  EXTREMITIES: No pedal edema, cyanosis, or clubbing.  NEUROLOGIC: Cranial nerves II through XII are intact. Muscle strength 5/5 in all extremities. Sensation intact. Gait not checked.  PSYCHIATRIC: The patient is alert and oriented x 3 now but has short term memory loss and no memory of the events dating to the last week.  SKIN: No obvious rash, lesion, or ulcer.   LABORATORY PANEL:   CBC  Recent Labs  Lab 03/12/17 1750  WBC 4.3  HGB 14.9  HCT 43.4  PLT 222   ------------------------------------------------------------------------------------------------------------------  Chemistries   Recent Labs Lab 03/12/17 1750  NA 133*  K 4.5  CL 99*  CO2 25  GLUCOSE 98  BUN 20    CREATININE 0.92  CALCIUM 9.7  MG 2.0  AST 41  ALT 22  ALKPHOS 60  BILITOT 1.5*   ------------------------------------------------------------------------------------------------------------------  Cardiac Enzymes No results for input(s): TROPONINI in the last 168 hours. ------------------------------------------------------------------------------------------------------------------  RADIOLOGY:  Ct Head Code Stroke Wo Contrast`  Addendum Date: 03/12/2017   ADDENDUM REPORT: 03/12/2017 19:06 ADDENDUM: These results were called by telephone at the time of interpretation on 03/12/2017 at 6:40 Pm to Dr. Rudene Re , who verbally acknowledged these results. Electronically Signed   By: Ulyses Jarred M.D.   On: 03/12/2017 19:06   Result Date: 03/12/2017 CLINICAL DATA:  Code stroke.  4-5 days memory loss EXAM: CT HEAD WITHOUT CONTRAST TECHNIQUE: Contiguous axial images were obtained from the base of the skull through the vertex without intravenous contrast. COMPARISON:  None. FINDINGS: Brain: No mass lesion, intraparenchymal hemorrhage or extra-axial collection. No evidence of acute cortical infarct. Brain parenchyma and CSF-containing spaces are normal for age. Vascular: No hyperdense vessel or unexpected calcification. Skull: Normal visualized skull base, calvarium and extracranial soft tissues. Sinuses/Orbits: No sinus fluid levels or advanced mucosal thickening. No mastoid effusion. Normal orbits. ASPECTS [facility] Stroke Program Early CT Score) - Ganglionic level infarction (caudate, lentiform nuclei, internal capsule, insula, M1-M3 cortex): 7 - Supraganglionic infarction (M4-M6 cortex): 3 Total score (0-10 with 10 being normal): 10 IMPRESSION: 1. Normal CT of the brain for age. 2. ASPECTS is 10. Electronically Signed: By: Ulyses Jarred M.D. On: 03/12/2017 18:35    EKG:   Orders placed or performed during the hospital encounter of 03/12/17  . EKG 12-Lead  . EKG 12-Lead  . ED EKG  . ED EKG     IMPRESSION AND PLAN:   Charlotte Wagner  is a 81 y.o. female with a known history of CAD status post CABG, arthritis, GERD, hypertension, CKD with the resident of [facility] assisted living presents to the hospital secondary to acute confusion this afternoon.  #1 Transient global amnesia: admit under observation - no other neurological deficits noted - patient already on eliquis and plavix - MRI brain, neuro checks, neurology consult and carotid dopplers requested - on zetia due to statin allergy  #2 Persistent afib- due to being symptomatic, cardioversion done successfully on 03/08/17- now back in afib with elevated heart rate - Based on outpatient cardiology records, she never had trouble with elevated heart rate and has usually been bradycardic. -Continue bisoprolol and verapamil. When necessary metoprolol as needed -Cardiology consultation. On eliquis for anticoagulation.  #3 hypertension-continue home medications.  #4 GERD-on Protonix  #5 DVT prophylaxis-already on eliquis   All the records are reviewed and case discussed with ED provider. Management plans discussed with the patient, family and they are in agreement.  CODE STATUS: Full Code  TOTAL TIME TAKING CARE OF THIS PATIENT: 50 minutes.    Charlotte Wagner M.D on 03/12/2017 at 8:41 PM  Between 7am to 6pm - Pager - [phone]  After 6pm go to [email or web address] - password [email or web address]  Office  [phone]  CC: Primary care physician; [name], MD

## 2017-03-12 NOTE — Telephone Encounter (Signed)
Script faxed to [employer]. Patient advised.

## 2017-03-12 NOTE — ED Provider Notes (Addendum)
[facility] Emergency Department Provider Note  ____________________________________________  Time seen: Approximately 6:12 PM  I have reviewed the triage vital signs and the nursing notes.   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 History is gathered from patient's friends who live between legs with her. According to them patient was being interviewed by a home health nurse around 4:30 PM when patient developed sudden onset of confusion and disorientation. No facial droop, no slurred speech, no dysarthria or difficulty finding words, no unilateral weakness or numbness or gait abnormalities. According to patient's friends she is usually 100% with it and very smart and this is an acute change from her baseline. No prior history of stroke. She does have a history of chronic UTIs. Patient is also complaining of a moderate throbbing left-sided headache. Patient does not remember any of the events leading to her arrival to the emergency room. She denies chest pain or shortness of breath, abdominal pain, nausea or vomiting, back pain, fever or chills, dysuria.  Past Medical History:  Diagnosis Date  . Allergy   . Arthritis    s/p bilateral knees and left hip replacement  . Asthma   . CAD (coronary artery disease)    a. 12/1996 s/p CABG x4 ([place]);  b. 2005 Pt reports stress test & cath, which revealed patent grafts.  . Cancer ([place])    melanoma right arm  . Carotid arterial disease ([place])    a. 04/2015 Carotid U/S: <50% bilat ICA stenosis.  . Chronic kidney disease   . Colon polyps    H/O  . Depression   . GERD (gastroesophageal reflux disease)    h/o hiatal hernia  .  Headache    migraines in past  . Heart murmur    a. 04/2011 Echo: EF 55-60%, bilat atrial enlargement, mild to mod TR.  [name] History of chicken pox   . History of hiatal hernia   . Hx of migraines    rare now  . Hx: UTI (urinary tract infection)   . Hyperlipidemia    a. Statin intolerant -->on zetia.  . Hypertensive heart disease   . Lichen planus   . Melanoma ([place])   . Palpitations    a. rare PVC's and h/o SVT.  [name] PMR (polymyalgia rheumatica) (HCC)    h/o in setting of crestor usage.  [name] PSVT (paroxysmal supraventricular tachycardia) ([place])   . PUD (peptic ulcer disease)    remote history  . Raynaud's phenomenon   . Spinal stenosis   . Urine incontinence    H/O  . Vitamin D deficiency     Patient Active Problem List   Diagnosis Date Noted  . Amnesia 03/12/2017  . Chronic diastolic heart failure ([place]) 10/19/2016  . Atrial fibrillation ([place]) 10/09/2016  . Hyponatremia 10/09/2016  . Dyspnea 06/07/2016  . Anxiety 06/07/2016  . Hypertensive heart disease   . Hyperlipidemia   . Angina pectoris ([place]) 05/31/2016  . Palpitations 05/29/2016  . Left carotid bruit 04/18/2015  . Health care maintenance 04/18/2015  . UTI (urinary tract infection) 02/07/2015  . Viral syndrome 01/04/2015  . Dysuria 01/04/2015  . Dysphagia 12/21/2014  . Arm skin lesion, right 12/14/2013  . CAD (coronary artery disease)  06/21/2013  . History of colonic polyps 06/21/2013  . Vitamin D deficiency 06/21/2013  . Osteoarthritis 06/21/2013  . Essential hypertension, benign 06/21/2013  . GERD (gastroesophageal reflux disease) 06/21/2013  . Hiatal hernia 06/21/2013  . Goiter 06/21/2013  . Lichen planus 16/96/7893  . Depression 06/21/2013  . Hypercholesterolemia 06/21/2013  . Spinal stenosis 06/21/2013  . Asthma 06/21/2013  . Migraine 06/21/2013  . Frequent UTI 06/21/2013  . Hemorrhoids 06/21/2013    Past Surgical History:  Procedure Laterality Date  . ADENOIDECTOMY     age 86  . BACK SURGERY      L3-L5  . BILATERAL CARPAL TUNNEL RELEASE    . BREAST BIOPSY Right    bx x 3 neg  . BREAST SURGERY Right    biopsy x 3 (all benign)  . CARDIAC CATHETERIZATION N/A 06/01/2016   Procedure: LEFT HEART CATH AND CORS/GRAFTS ANGIOGRAPHY;  Surgeon: Wellington Hampshire, MD;  Location: [place] [facility];  Service: Cardiovascular;  Laterality: N/A;  . CARDIAC CATHETERIZATION N/A 06/01/2016   Procedure: Coronary Stent Intervention;  Surgeon: Wellington Hampshire, MD;  Location: [place] [facility];  Service: Cardiovascular;  Laterality: N/A;  . CARDIOVERSION N/A 03/08/2017   Procedure: CARDIOVERSION;  Surgeon: Wellington Hampshire, MD;  Location: [facility];  Service: Cardiovascular;  Laterality: N/A;  . CATARACT EXTRACTION W/PHACO Right 01/04/2016   Procedure: CATARACT EXTRACTION PHACO AND INTRAOCULAR LENS PLACEMENT ([place]);  Surgeon: Leandrew Koyanagi, MD;  Location: [place];  Service: Ophthalmology;  Laterality: Right;  MALYUGIN  . CATARACT EXTRACTION W/PHACO Left 01/25/2016   Procedure: CATARACT EXTRACTION PHACO AND INTRAOCULAR LENS PLACEMENT ([place]) left eye;  Surgeon: Leandrew Koyanagi, MD;  Location: [place];  Service: Ophthalmology;  Laterality: Left;  MALYUGIN SHUGARCAINE  . CHOLECYSTECTOMY  90's  . COLONOSCOPY WITH PROPOFOL N/A 05/03/2015   Procedure: COLONOSCOPY WITH PROPOFOL;  Surgeon: Lollie Sails, MD;  Location: [facility] END[facility]COPY;  Service: Endoscopy;  Laterality: N/A;  . CORONARY ARTERY BYPASS GRAFT  98   4 vessel  . ESOPHAGOGASTRODUODENOSCOPY N/A 03/22/2015   Procedure: ESOPHAGOGASTRODUODENOSCOPY (EGD);  Surgeon: Lollie Sails, MD;  [facility]COPY;  Service: Endoscopy;  Laterality: N/A;  . JOINT REPLACEMENT     BILATERAL KNEE REPLACEMENTS  . KNEE ARTHROSCOPY W/ OATS PROCEDURE     Lt knee (9/01), Rt knee (3/11), Lt hip (5/10)  . TOTAL HIP ARTHROPLASTY      Prior to Admission medications   Medication Sig Start Date End Date Taking? Authorizing Provider    acetaminophen (TYLENOL) 500 MG tablet Take 1,000 mg by mouth 2 (two) times daily.    Yes [provider]  amitriptyline (ELAVIL) 25 MG tablet Take 1-2 tablets (25-50 mg total) by mouth at bedtime. Patient taking differently: Take 25 mg by mouth at bedtime.  09/06/16  Yes [name], MD  b complex vitamins tablet Take 1 tablet by mouth daily.   Yes [provider]  bisoprolol (ZEBETA) 5 MG tablet Take 2.5 mg by mouth at bedtime.   Yes [provider]  Cholecalciferol (VITAMIN D3) 2000 UNITS TABS Take 2,000 Units by mouth daily.    Yes [provider]  clopidogrel (PLAVIX) 75 MG tablet take 1 tablet by mouth once daily 02/05/17  Yes Ingal, Luanna Cole, MD  Coenzyme Q10 (COQ-10) 100 MG CAPS Take 100 mg by mouth daily.    Yes [provider]  ELIQUIS 5 MG TABS tablet take 1 tablet by mouth twice a day 02/05/17  Yes Wende Bushy, MD  ezetimibe (ZETIA) 10 MG tablet take 1 tablet by mouth once daily 09/03/16  Yes [name], MD  Fluticasone-Salmeterol (ADVAIR) 250-50 MCG/DOSE AEPB Inhale 1 puff into the lungs 2 (two) times daily as needed (for asthmatic bronchitis after colds.).    Yes [provider]  folic acid (FOLVITE) 1 MG tablet take 1 tablet by mouth once daily 12/10/16  Yes [name], MD  furosemide (LASIX) 20 MG tablet Take 0.5 tablets (10 mg total) by mouth daily. Patient taking differently: Take 20 mg by mouth daily as needed (for weight gain of 2 lbs or more).  10/11/16 03/06/18 Yes Wende Bushy, MD  hydrocortisone 2.5 % cream apply to VULVA twice a day AT 10AM AND 4 PM Patient taking differently: apply to VULVA at bedtime as needed for with miconazole for lichen planus 05/15/55  Yes [name], MD  ipratropium (ATROVENT HFA) 17 MCG/ACT inhaler Inhale 1 puff into the lungs every 4 (four) hours as needed (for asthmatic bronchitis after colds.).    Yes [provider]  ketoconazole (NIZORAL) 2 % cream Apply 1 application  topically daily. Patient taking differently: Apply 1 application topically daily as needed (for crack bleeding fingers.).  06/15/14  Yes [name], MD  loratadine (CLARITIN) 10 MG tablet Take 10 mg by mouth daily.   Yes [provider]  meclizine (ANTIVERT) 25 MG tablet Take 25 mg by mouth 3 (three) times daily as needed for dizziness.   Yes [provider]  miconazole (MICOTIN) 2 % cream Apply 1 application topically at bedtime as needed (with hydrocortisone for lichen planus).    Yes [provider]  Multiple Vitamins-Minerals (HAIR/SKIN/NAILS PO) Take 1 tablet by mouth daily.   Yes [provider]  nitroGLYCERIN (NITROSTAT) 0.4 MG SL tablet Place 1 tablet (0.4 mg total) under the tongue every 5 (five) minutes as needed for chest pain. Place 1 tablet (0.4 mg total) under the tongue every 5 (five) minutes , 3 times as needed for chest pain. 06/07/16  Yes Gouru, Illene Silver, MD  Omega-3 Fatty Acids (FISH OIL PO) Take 2,000 mg by mouth daily.   Yes [provider]  pantoprazole (PROTONIX) 40 MG tablet take 1 tablet by mouth twice a day 01/30/17  Yes [name], MD  polyethylene glycol powder (GLYCOLAX/MIRALAX) powder Take 8.5 g by mouth once.   Yes [provider]  ramipril (ALTACE) 10 MG capsule take 1 capsule by mouth once daily Patient taking differently: take 1 capsule by mouth once at bedtime 11/16/16  Yes [name], MD  spironolactone (ALDACTONE) 25 MG tablet take 1 tablet by mouth every morning 02/28/17  Yes [name], MD  traMADol (ULTRAM) 50 MG tablet Take 1 tablet (50 mg total) by mouth daily as needed. 03/12/17  Yes [name], MD  traZODone (DESYREL) 150 MG tablet take 1 and 2/3-2 tablets by mouth at bedtime as directed Patient taking differently: Take 1.5 tablets (225 mg) by mouth at bedtime. 11/29/16  Yes [name], MD  verapamil (CALAN-SR) 180 MG CR tablet Take 1 tablet (180 mg total) by mouth at bedtime. Patient  taking differently: Take 180 mg by mouth 2 (two) times daily.  02/28/17  Yes Deboraha Sprang, MD  vitamin B-12 (CYANOCOBALAMIN) 1000 MCG tablet Take 1,000 mcg by mouth daily.   Yes [provider]  vitamin C (ASCORBIC ACID) 500 MG tablet Take 500 mg by mouth at bedtime.   Yes [provider]    Allergies Beta adrenergic blockers; Brilinta [ticagrelor];  Albuterol; Aspirin; Nsaids; Penicillins; Statins; Sulfa antibiotics; Tetracycline; and Tetracyclines & related  Family History  Problem Relation Age of Onset  . Thyroid disease Mother        graves disease  . Heart disease Father        rheumatic heart  . Alcohol abuse Father   . Depression Father   . Lung cancer Sister   . Depression Daughter   . Thyroid disease Daughter        hashimoto  . Depression Son   . Stroke Maternal Grandmother   . Depression Maternal Grandmother   . Hypertension Maternal Grandfather   . Hypertension Paternal Grandfather   . Seizures Son   . Breast cancer Neg Hx     Social History Social History  Substance Use Topics  . Smoking status: Former Research scientist (life sciences)  . Smokeless tobacco: Never Used     Comment: quit 44+ yrs ago  . Alcohol use No    Review of Systems  Constitutional: Negative for fever. + confusion Eyes: Negative for visual changes. ENT: Negative for sore throat. Neck: No neck pain  Cardiovascular: Negative for chest pain. Respiratory: Negative for shortness of breath. Gastrointestinal: Negative for abdominal pain, vomiting or diarrhea. Genitourinary: Negative for dysuria. Musculoskeletal: Negative for back pain. Skin: Negative for rash. Neurological: Negative for weakness or numbness. + ha Psych: No SI or HI  ____________________________________________   PHYSICAL EXAM:  VITAL SIGNS: ED Triage Vitals  Enc Vitals Group     BP 03/12/17 1748 (!) 192/104     Pulse Rate 03/12/17 1748 (!) 117     Resp 03/12/17 1748 (!) 24     Temp 03/12/17 1748 98.7 F (37.1 C)      Temp Source 03/12/17 1748 Oral     SpO2 03/12/17 1748 99 %     Weight 03/12/17 1748 183 lb (83 kg)     Height 03/12/17 1748 5\' 4"  (1.626 m)     Head Circumference --      Peak Flow --      Pain Score 03/12/17 1746 0     Pain Loc --      Pain Edu? --      Excl. in [place]? --     Constitutional: Alert and oriented to self and place. Well appearing and in no apparent distress. HEENT:      Head: Normocephalic and atraumatic.         Eyes: Conjunctivae are normal. Sclera is non-icteric.       Mouth/Throat: Mucous membranes are moist.       Neck: Supple with no signs of meningismus. Cardiovascular: Irregularly irregular rhythm with tachycardic rate. No murmurs, gallops, or rubs. 2+ symmetrical distal pulses are present in all extremities. No JVD. Respiratory: Normal respiratory effort. Lungs are clear to auscultation bilaterally. No wheezes, crackles, or rhonchi.  Gastrointestinal: Soft, non tender, and non distended with positive bowel sounds. No rebound or guarding. Genitourinary: No CVA tenderness. Musculoskeletal: Nontender with normal range of motion in all extremities. No edema, cyanosis, or erythema of extremities. Neurologic: Normal speech and language. A & O x3, PERRL, no nystagmus, CN II-XII intact, motor testing reveals good tone and bulk throughout. There is no evidence of pronator drift or dysmetria. Muscle strength is 5/5 throughout. Deep tendon reflexes are 2+ throughout with downgoing toes. Sensory examination is intact. Gait is normal. Skin: Skin is warm, dry and intact. No rash noted. Psychiatric: Mood and affect are normal. Speech and behavior are normal.  NIH Stroke  Scale  Interval: Baseline Time: 7:52 PM Person Administering Scale: [place] stroke scale items in the order listed. Record performance in each category after each subscale exam. Do not go back and change scores. Follow directions provided for each exam technique. Scores should reflect what  the patient does, not what the clinician thinks the patient can do. The clinician should record answers while administering the exam and work quickly. Except where indicated, the patient should not be coached (i.e., repeated requests to patient to make a special effort).   1a  Level of consciousness: 0=alert; keenly responsive  1b. LOC questions:  1=Performs one task correctly  1c. LOC commands: 0=Performs both tasks correctly  2.  Best Gaze: 0=normal  3.  Visual: 0=No visual loss  4. Facial Palsy: 0=Normal symmetric movement  5a.  Motor left arm: 0=No drift, limb holds 90 (or 45) degrees for full 10 seconds  5b.  Motor right arm: 0=No drift, limb holds 90 (or 45) degrees for full 10 seconds  6a. motor left leg: 0=No drift, limb holds 90 (or 45) degrees for full 10 seconds  6b  Motor right leg:  0=No drift, limb holds 90 (or 45) degrees for full 10 seconds  7. Limb Ataxia: 0=Absent  8.  Sensory: 0=Normal; no sensory loss  9. Best Language:  0=No aphasia, normal  10. Dysarthria: 0=Normal  11. Extinction and Inattention: 0=No abnormality   Total:   1     ____________________________________________   LABS (all labs ordered are listed, but only abnormal results are displayed)  Labs Reviewed  COMPREHENSIVE METABOLIC PANEL - Abnormal; Notable for the following:       Result Value   Sodium 133 (*)    Chloride 99 (*)    Total Bilirubin 1.5 (*)    GFR calc non Af Amer 58 (*)    All other components within normal limits  URINE DRUG SCREEN, QUALITATIVE ([facility] ONLY) - Abnormal; Notable for the following:    Tricyclic, Ur Screen POSITIVE (*)    All other components within normal limits  URINALYSIS, COMPLETE (UACMP) WITH MICROSCOPIC - Abnormal; Notable for the following:    Color, Urine YELLOW (*)    APPearance CLEAR (*)    Specific Gravity, Urine 1.004 (*)    All other components within normal limits  CBC  ETHANOL  PROTIME-INR  APTT  MAGNESIUM    ____________________________________________  EKG  ED ECG REPORT I, Rudene Re, the attending physician, personally viewed and interpreted this ECG.  Atrial fibrillation, rate of 131, normal QRS and QTc intervals, normal axis, no ST elevations or depressions.  ____________________________________________  RADIOLOGY  Head CT: Normal CT of the brain for age.  ____________________________________________   PROCEDURES  Procedure(s) performed: None Procedures Critical Care performed:  yes ____________________________________________   INITIAL IMPRESSION / ASSESSMENT AND PLAN / ED COURSE  81 y.o. female with a history of atrial fibrillation cardioverted 4 days ago, coronary artery disease status post CABG and stent, chronic kidney disease, melanoma, asthma, migraine headaches who presents for evaluation of acute onset of altered mental status and confusion. Last seen normal 4:30PM. Patient is alert and oriented to self and place, does not know the year or the president which is abnormal for her, she is neurologically intact otherwise. 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 Patient then starts repeating the same stuff over and over again.  I walked out of the room and walked back into the room and she seems to do not recognize me and starts saying the same things again. She does not remember any of the events preceding her visit here and does not remember being cardioverted 4 days ago. Due to her level of confusion, the fact she is on Plavix, her recent cardioversion, and the fact she is back on afib, patient was made a code stroke on arrival. NIHSS 1.    _________________________ 6:54 PM on 03/12/2017 -----------------------------------------  Patient evaluated by neurologist believe patient has transient global amnesia. Head CT negative for acute changes.  Neurology recommended patient's blood pressure to be addressed. Patient in afib with RVR therefore BB would be my first choice. However she is allergic to beta blockers therefore we'll treat her with nicardipine for BP and rate control. Patient continues to have amnesia with no other neuro deficits. She will be admitted to the hospitalist service. Telemetry showed one episode of non sustained Vtach for 7 beats and patient was asymptomatic. K normal at 4.5, magnesium pending  Pertinent labs & imaging results that were available during my care of the patient were reviewed by me and considered in my medical decision making (see chart for details).    ____________________________________________   FINAL CLINICAL IMPRESSION(S) / ED DIAGNOSES  Final diagnoses:  Confusion  Transient global amnesia  V-tach ([place])  Atrial fibrillation with RVR (HCC)      NEW MEDICATIONS STARTED DURING THIS VISIT:  New Prescriptions   No medications on file     Note:  This document was prepared using Dragon voice recognition software and may include unintentional dictation errors.     Alfred Levins, [place], MD 03/12/17 [place] Michael, [place], MD 03/12/17 [place], [place], MD 03/12/17 [phone]

## 2017-03-12 NOTE — ED Notes (Signed)
Patient transported to ultrasound.

## 2017-03-12 NOTE — Progress Notes (Signed)
Pt. Had confusion and could not remember things.

## 2017-03-12 NOTE — ED Notes (Signed)
Pt taken to CT by [facility] RN

## 2017-03-12 NOTE — ED Notes (Signed)
Assisted pt to bathroom. Connected pt back to monitor. Pt going to ultrasound now.

## 2017-03-12 NOTE — ED Notes (Signed)
Assisted patient with ambulation to bathroom.  

## 2017-03-12 NOTE — ED Notes (Signed)
This Rn had sent down a rainbow and urine, new labs added on by Dr. Alfred Levins, called lab about orders. They stated they would look for tubes and urine to run tests.

## 2017-03-12 NOTE — Telephone Encounter (Signed)
Medication Refill requested for : tramadol  Pharmacy:[facility]  Return Contact : [phone]

## 2017-03-12 NOTE — ED Triage Notes (Signed)
Patient comes in from [facility] via [facility] with altered mental status. Patient keeps repeating her self. Per EMS patient was talking someone at her apartment  and was answering question inappropriately. Per EMS stroke screen was negative. Patient is alert and oriented to self, place. Disoriented to time. Patient is a retired Engineer, drilling. Patient has a history of a-fib and chronic UTIs. Patient was cardioverted last week.

## 2017-03-13 ENCOUNTER — Observation Stay: Payer: Medicare Other

## 2017-03-13 DIAGNOSIS — I2510 Atherosclerotic heart disease of native coronary artery without angina pectoris: Secondary | ICD-10-CM | POA: Diagnosis not present

## 2017-03-13 DIAGNOSIS — G454 Transient global amnesia: Secondary | ICD-10-CM

## 2017-03-13 DIAGNOSIS — R413 Other amnesia: Secondary | ICD-10-CM

## 2017-03-13 DIAGNOSIS — I25118 Atherosclerotic heart disease of native coronary artery with other forms of angina pectoris: Secondary | ICD-10-CM

## 2017-03-13 DIAGNOSIS — I4891 Unspecified atrial fibrillation: Secondary | ICD-10-CM

## 2017-03-13 DIAGNOSIS — E785 Hyperlipidemia, unspecified: Secondary | ICD-10-CM | POA: Diagnosis not present

## 2017-03-13 DIAGNOSIS — I481 Persistent atrial fibrillation: Secondary | ICD-10-CM

## 2017-03-13 DIAGNOSIS — R410 Disorientation, unspecified: Secondary | ICD-10-CM

## 2017-03-13 DIAGNOSIS — G459 Transient cerebral ischemic attack, unspecified: Secondary | ICD-10-CM | POA: Diagnosis not present

## 2017-03-13 DIAGNOSIS — R4182 Altered mental status, unspecified: Secondary | ICD-10-CM | POA: Diagnosis not present

## 2017-03-13 DIAGNOSIS — I10 Essential (primary) hypertension: Secondary | ICD-10-CM | POA: Diagnosis not present

## 2017-03-13 LAB — CBC
HEMATOCRIT: 39.1 % (ref 35.0–47.0)
Hemoglobin: 13.4 g/dL (ref 12.0–16.0)
MCH: 33.6 pg (ref 26.0–34.0)
MCHC: 34.3 g/dL (ref 32.0–36.0)
MCV: 98 fL (ref 80.0–100.0)
PLATELETS: 200 10*3/uL (ref 150–440)
RBC: 3.99 MIL/uL (ref 3.80–5.20)
RDW: 13.3 % (ref 11.5–14.5)
WBC: 5 10*3/uL (ref 3.6–11.0)

## 2017-03-13 LAB — TROPONIN I
Troponin I: 0.06 ng/mL (ref <0.03)
Troponin I: 0.03 ng/mL (ref <0.03)

## 2017-03-13 LAB — BASIC METABOLIC PANEL
Anion gap: 7 (ref 5–15)
BUN: 20 mg/dL (ref 6–20)
CHLORIDE: 104 mmol/L (ref 101–111)
CO2: 25 mmol/L (ref 22–32)
Calcium: 9 mg/dL (ref 8.9–10.3)
Creatinine, Ser: 0.86 mg/dL (ref 0.44–1.00)
GFR calc Af Amer: >60 mL/min (ref >60)
GFR calc non Af Amer: >60 mL/min (ref >60)
GLUCOSE: 116 mg/dL — AB (ref 65–99)
POTASSIUM: 4 mmol/L (ref 3.5–5.1)
Sodium: 136 mmol/L (ref 135–145)

## 2017-03-13 MED ORDER — GLYCERIN (LAXATIVE) 2.1 G RE SUPP
1.0000 | Freq: Once | RECTAL | Status: AC
  Administered 2017-03-13: 10:00:00 1 via RECTAL
  Filled 2017-03-13: qty 1

## 2017-03-13 MED ORDER — POLYETHYLENE GLYCOL 3350 17 G PO PACK
17.0000 g | PACK | Freq: Every day | ORAL | Status: DC
  Administered 2017-03-13: 11:00:00 17 g via ORAL
  Filled 2017-03-13: qty 1

## 2017-03-13 NOTE — Care Management (Addendum)
Admitted to [facility] under observation status with the diagnosis of transient amnesia. Lives at [facility] since 03/17/13.  Daughter is Rudene Christians [phone]). Last seen Dr. Randell Patient scott 01/07/17. Takes care of all basic and instrumental activities of daily living herself, drives.  Physical therapy evaluation completed. Recommending home with home health and physical therapy. Discussed recommendations with ms. Mcmonigle at the bedside. States she would like to get these services through [facility].  Shelbie Ammons RN MSN CCM Care Management [phone]

## 2017-03-13 NOTE — Progress Notes (Signed)
CRITICAL VALUE ALERT  Critical Value:  troproin 0.04  Date & Time Notied:  03/13/2017 2239  Provider Notified:david Jannifer Franklin  Orders Received/Actions taken: no new orders

## 2017-03-13 NOTE — Progress Notes (Signed)
CRITICAL VALUE ALERT  Critical Value:  Troponin 0.06  Date & Time Notied: 03/13/2017  Provider Notified: Dr. Posey Pronto   Orders Received/Actions taken: no new orders

## 2017-03-13 NOTE — Care Management Obs Status (Signed)
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

## 2017-03-13 NOTE — Care Management (Signed)
Physical therapy evaluation completed. Recommending home with home health and physical therapy. Telephone call to Seth Bake at [facility] [phone]) and Waldemar Dickens at [facility]. Left voice mail. Shelbie Ammons RN MSN CCM Care Management [phone]

## 2017-03-13 NOTE — Progress Notes (Signed)
Pt being discharged home, discharge instructions reviewed with pt, states understanding, pt with no complaints 

## 2017-03-13 NOTE — Progress Notes (Signed)
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 Order received, chart reviewed. Pt pleasant upon OT entry, reporting no significant functional deficits, and politely declining OT services. Pt able to perform bed mobility and transfers at modified independent level, stood at sink to brush teeth with no assist, good safety awareness, and no LOB noted. Pt eager to return to [facility]. No additional OT needs at this time. Will sign off. Please re-consult if additional OT needs arise.   Jeni Salles, MPH, MS, OTR/L ascom [phone] 03/13/17, 2:02 PM

## 2017-03-13 NOTE — Consult Note (Addendum)
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  Was noted by friends to be confused at about 4p.  Patient had poor short term memory and was asking the same questions over and over.  There were no focal abnormalities.  She reports that she started to came back to norma overnight.  Initial NIHSS of 2.    Date last known well: Date: 03/12/2017 Time last known well: Time: 13:15 tPA Given: No: Outside time window  Past Medical History:  Diagnosis Date  . Allergy   . Arthritis    s/p bilateral knees and left hip replacement  . Asthma   . CAD (coronary artery disease)    a. 12/1996 s/p CABG x4 ([place]);  b. 2005 Pt reports stress test & cath, which revealed patent grafts.  . Cancer ([place])    melanoma right arm  . Carotid arterial disease ([place])    a. 04/2015 Carotid U/S: <50% bilat ICA stenosis.  . Chronic kidney disease   . Colon polyps    H/O  . Depression   . GERD (gastroesophageal reflux disease)    h/o hiatal hernia  . Headache    migraines in past  . Heart murmur    a. 04/2011 Echo: EF 55-60%, bilat atrial enlargement, mild to mod TR.  [name] History of chicken pox   . History of hiatal hernia   . Hx of migraines    rare now  . Hx: UTI (urinary tract infection)   . Hyperlipidemia    a. Statin intolerant -->on zetia.  . Hypertensive heart disease   . Lichen planus   . Melanoma ([place])   . Palpitations    a. rare PVC's and h/o SVT.  [name] PMR (polymyalgia rheumatica) (H[facility])    h/o in setting of crestor usage.  [name] PSVT (paroxysmal supraventricular tachycardia) ([place])   . PUD (peptic ulcer disease)    remote history  . Raynaud's phenomenon   . Spinal stenosis   . Urine incontinence    H/O  . Vitamin D deficiency     Past Surgical History:  Procedure Laterality Date  . ADENOIDECTOMY      age 34  . BACK SURGERY     L3-L5  . BILATERAL CARPAL TUNNEL RELEASE    . BREAST BIOPSY Right    bx x 3 neg  . BREAST SURGERY Right    biopsy x 3 (all benign)  . CARDIAC CATHETERIZATION N/A 06/01/2016   Procedure: LEFT HEART CATH AND CORS/GRAFTS ANGIOGRAPHY;  Surgeon: Wellington Hampshire, MD;  Location: [facility];  Service: Cardiovascular;  Laterality: N/A;  . CARDIAC CATHETERIZATION N/A 06/01/2016   Procedure: Coronary Stent Intervention;  Surgeon: Wellington Hampshire, MD;  Location: [facility];  Service: Cardiovascular;  Laterality: N/A;  . CARDIOVERSION N/A 03/08/2017   Procedure: CARDIOVERSION;  Surgeon: Wellington Hampshire, MD;  Location: [facility];  Service: Cardiovascular;  Laterality: N/A;  . CATARACT EXTRACTION W/PHACO Right 01/04/2016   Procedure: CATARACT EXTRACTION PHACO AND INTRAOCULAR LENS PLACEMENT ([place]);  Surgeon: Leandrew Koyanagi, MD;  Location: [facility];  Service: Ophthalmology;  Laterality: Right;  MALYUGIN  . CATARACT EXTRACTION W/PHACO Left 01/25/2016   Procedure: CATARACT EXTRACTION PHACO AND INTRAOCULAR LENS PLACEMENT (I[facility]) left eye;  Surgeon: Leandrew Koyanagi, MD;  Location: [facility];  Service: Ophthalmology;  Laterality: Left;  MALYUGIN SHUGARCAINE  . CHOLECYSTECTOMY  90's  . COLONOSCOPY WITH PROPOFOL N/A 05/03/2015   Procedure: COLONOSCOPY WITH PROPOFOL;  Surgeon: Lollie Sails, MD;  Location: [facility];  Service: Endoscopy;  Laterality: N/A;  . CORONARY ARTERY BYPASS GRAFT  98   4 vessel  . ESOPHAGOGASTRODUODENOSCOPY N/A 03/22/2015   Procedure: ESOPHAGOGASTRODUODENOSCOPY (EGD);  Surgeon: Lollie Sails, MD;  Location: [facility];  Service: Endoscopy;  Laterality: N/A;  . JOINT REPLACEMENT     BILATERAL KNEE REPLACEMENTS  . KNEE ARTHROSCOPY W/ OATS PROCEDURE     Lt knee (9/01), Rt knee (3/11), Lt hip (5/10)  . TOTAL HIP ARTHROPLASTY      Family History  Problem Relation Age of Onset  . Thyroid disease Mother         graves disease  . Heart disease Father        rheumatic heart  . Alcohol abuse Father   . Depression Father   . Lung cancer Sister   . Depression Daughter   . Thyroid disease Daughter        hashimoto  . Depression Son   . Stroke Maternal Grandmother   . Depression Maternal Grandmother   . Hypertension Maternal Grandfather   . Hypertension Paternal Grandfather   . Seizures Son   . Breast cancer Neg Hx    Social History:  reports that she has quit smoking. She has never used smokeless tobacco. She reports that she does not drink alcohol or use drugs.  Allergies:  Allergies  Allergen Reactions  . Beta Adrenergic Blockers Shortness Of Breath    DEPRESSION/HYPOTENSION  . Brilinta [Ticagrelor] Shortness Of Breath    Caused AFIB  . Albuterol     rigors  . Aspirin Other (See Comments)    Heartburn   . Nsaids Other (See Comments)    ULCER HISTORY & CURRENTLY ON BLOOD THINNERS  . Penicillins Hives and Other (See Comments)    Has patient had a PCN reaction causing immediate rash, facial/tongue/throat swelling, SOB or lightheadedness with hypotension: No Has patient had a PCN reaction causing severe rash involving mucus membranes or skin necrosis: No Has patient had a PCN reaction that required hospitalization No Has patient had a PCN reaction occurring within the last 10 years: No If all of the above answers are "NO", then may proceed with Cephalosporin use.   . Statins Other (See Comments)    Muscle weakness  . Sulfa Antibiotics Rash    At mouth  . Tetracycline Rash  . Tetracyclines & Related Rash    Medications:  I have reviewed the patient's current medications. Prior to Admission:  Prescriptions Prior to Admission  Medication Sig Dispense Refill Last Dose  . acetaminophen (TYLENOL) 500 MG tablet Take 1,000 mg by mouth 2 (two) times daily.    prn at prn  . amitriptyline (ELAVIL) 25 MG tablet Take 1-2 tablets (25-50 mg total) by mouth at bedtime. (Patient taking  differently: Take 25 mg by mouth at bedtime. ) 60 tablet 5 03/11/2017 at qhs  . b complex vitamins tablet Take 1 tablet by mouth daily.   03/12/2017 at am  . bisoprolol (ZEBETA) 5 MG tablet Take 2.5 mg by mouth at bedtime.   03/11/2017 at qhs  . Cholecalciferol (VITAMIN D3) 2000 UNITS TABS Take 2,000 Units by mouth daily.    03/12/2017 at am  . clopidogrel (PLAVIX) 75 MG tablet take 1 tablet by mouth once daily 30 tablet 6  03/12/2017 at am  . Coenzyme Q10 (COQ-10) 100 MG CAPS Take 100 mg by mouth daily.    03/12/2017 at am  . ELIQUIS 5 MG TABS tablet take 1 tablet by mouth twice a day 60 tablet 6 03/12/2017 at am  . ezetimibe (ZETIA) 10 MG tablet take 1 tablet by mouth once daily 30 tablet 7 03/12/2017 at am  . Fluticasone-Salmeterol (ADVAIR) 250-50 MCG/DOSE AEPB Inhale 1 puff into the lungs 2 (two) times daily as needed (for asthmatic bronchitis after colds.).    03/14/6194 at am  . folic acid (FOLVITE) 1 MG tablet take 1 tablet by mouth once daily 30 tablet 5 03/12/2017 at am  . furosemide (LASIX) 20 MG tablet Take 0.5 tablets (10 mg total) by mouth daily. (Patient taking differently: Take 20 mg by mouth daily as needed (for weight gain of 2 lbs or more). ) 15 tablet 3 prn at prn  . hydrocortisone 2.5 % cream apply to VULVA twice a day AT 10AM AND 4 PM (Patient taking differently: apply to VULVA at bedtime as needed for with miconazole for lichen planus) 28 g 0 prn at prn  . ipratropium (ATROVENT HFA) 17 MCG/ACT inhaler Inhale 1 puff into the lungs every 4 (four) hours as needed (for asthmatic bronchitis after colds.).    prn at prn  . ketoconazole (NIZORAL) 2 % cream Apply 1 application topically daily. (Patient taking differently: Apply 1 application topically daily as needed (for crack bleeding fingers.). ) 30 g 0 prn at prn  . loratadine (CLARITIN) 10 MG tablet Take 10 mg by mouth daily.   prn at prn  . meclizine (ANTIVERT) 25 MG tablet Take 25 mg by mouth 3 (three) times daily as needed for dizziness.   prn at prn   . miconazole (MICOTIN) 2 % cream Apply 1 application topically at bedtime as needed (with hydrocortisone for lichen planus).    prn at prn  . Multiple Vitamins-Minerals (HAIR/SKIN/NAILS PO) Take 1 tablet by mouth daily.   03/12/2017 at Unknown time  . nitroGLYCERIN (NITROSTAT) 0.4 MG SL tablet Place 1 tablet (0.4 mg total) under the tongue every 5 (five) minutes as needed for chest pain. Place 1 tablet (0.4 mg total) under the tongue every 5 (five) minutes , 3 times as needed for chest pain. 15 tablet 0 prn at prn  . Omega-3 Fatty Acids (FISH OIL PO) Take 2,000 mg by mouth daily.   03/12/2017 at am  . pantoprazole (PROTONIX) 40 MG tablet take 1 tablet by mouth twice a day 60 tablet 5 03/12/2017 at am  . polyethylene glycol powder (GLYCOLAX/MIRALAX) powder Take 8.5 g by mouth once.   03/12/2017 at am  . ramipril (ALTACE) 10 MG capsule take 1 capsule by mouth once daily (Patient taking differently: take 1 capsule by mouth once at bedtime) 30 capsule 3 03/11/2017 at qhs  . spironolactone (ALDACTONE) 25 MG tablet take 1 tablet by mouth every morning 30 tablet 5 03/12/2017 at am  . traMADol (ULTRAM) 50 MG tablet Take 1 tablet (50 mg total) by mouth daily as needed. 20 tablet 0 prn at prn  . traZODone (DESYREL) 150 MG tablet take 1 and 2/3-2 tablets by mouth at bedtime as directed (Patient taking differently: Take 1.5 tablets (225 mg) by mouth at bedtime.) 60 tablet 2 03/11/2017 at qhs  . verapamil (CALAN-SR) 180 MG CR tablet Take 1 tablet (180 mg total) by mouth at bedtime. (Patient taking differently: Take 180 mg by mouth 2 (two) times daily. )  03/12/2017 at am  . vitamin B-12 (CYANOCOBALAMIN) 1000 MCG tablet Take 1,000 mcg by mouth daily.   03/12/2017 at am  . vitamin C (ASCORBIC ACID) 500 MG tablet Take 500 mg by mouth at bedtime.   03/11/2017 at qhs   Scheduled: . amitriptyline  25 mg Oral QHS  . apixaban  5 mg Oral BID  . bisoprolol  2.5 mg Oral QHS  . cholecalciferol  2,000 Units Oral Daily  . clopidogrel  75 mg  Oral Daily  . ezetimibe  10 mg Oral Daily  . folic acid  1 mg Oral Daily  . omega-3 acid ethyl esters  1 g Oral Daily  . pantoprazole  40 mg Oral BID  . polyethylene glycol  17 g Oral Daily  . ramipril  10 mg Oral Daily  . spironolactone  25 mg Oral q morning - 10a  . traZODone  225 mg Oral QHS  . verapamil  180 mg Oral BID    ROS: History obtained from the patient  General ROS: negative for - chills, fatigue, fever, night sweats, weight gain or weight loss Psychological ROS: negative for - behavioral disorder, hallucinations, memory difficulties, mood swings or suicidal ideation Ophthalmic ROS: negative for - blurry vision, double vision, eye pain or loss of vision ENT ROS: negative for - epistaxis, nasal discharge, oral lesions, sore throat, tinnitus or vertigo Allergy and Immunology ROS: negative for - hives or itchy/watery eyes Hematological and Lymphatic ROS: negative for - bleeding problems, bruising or swollen lymph nodes Endocrine ROS: negative for - galactorrhea, hair pattern changes, polydipsia/polyuria or temperature intolerance Respiratory ROS: negative for - cough, hemoptysis, shortness of breath or wheezing Cardiovascular ROS: negative for - chest pain, dyspnea on exertion, edema or irregular heartbeat Gastrointestinal ROS: negative for - abdominal pain, diarrhea, hematemesis, nausea/vomiting or stool incontinence Genito-Urinary ROS: negative for - dysuria, hematuria, incontinence or urinary frequency/urgency Musculoskeletal ROS: back pain Neurological ROS: as noted in HPI Dermatological ROS: negative for rash and skin lesion changes  Physical Examination: Blood pressure 123/60, pulse 92, temperature 97.6 F (36.4 C), temperature source Oral, resp. rate 18, height 5\' 3"  (1.6 m), weight 83 kg (183 lb), SpO2 98 %.  HEENT-  Normocephalic, no lesions, without obvious abnormality.  Normal external eye and conjunctiva.  Normal TM's bilaterally.  Normal auditory canals and  external ears. Normal external nose, mucus membranes and septum.  Normal pharynx. Cardiovascular- S1, S2 normal, pulses palpable throughout   Lungs- chest clear, no wheezing, rales, normal symmetric air entry Abdomen- soft, non-tender; bowel sounds normal; no masses,  no organomegaly Extremities- no edema Lymph-no adenopathy palpable Musculoskeletal-no joint tenderness, deformity or swelling Skin-warm and dry, no hyperpigmentation, vitiligo, or suspicious lesions  Neurological Examination   Mental Status: Alert, oriented, thought content appropriate.  Speech fluent without evidence of aphasia.  Able to follow 3 step commands without difficulty. Cranial Nerves: II: Discs flat bilaterally; Visual fields grossly normal, pupils equal, round, reactive to light and accommodation III,IV, VI: ptosis not present, extra-ocular motions intact bilaterally V,VII: smile symmetric, facial light touch sensation normal bilaterally VIII: hearing normal bilaterally IX,X: gag reflex present XI: bilateral shoulder shrug XII: midline tongue extension Motor: Right : Upper extremity   5/5    Left:     Upper extremity   5/5  Lower extremity   5/5     Lower extremity   5/5 Tone and bulk:normal tone throughout; no atrophy noted Sensory: Pinprick and light touch decreased on the lateral aspect of the left  foot Deep Tendon Reflexes: 2+ in the upper extremities and absent in the lower extremities Plantars: Right: mute   Left: mute Cerebellar: Normal finger-to-nose testing bilaterally.  Dysmetria with heel to shin testing using the LLE Gait: not tested due to safety concerns    Laboratory Studies:  Basic Metabolic Panel:  Recent Labs Lab 03/12/17 1750 03/13/17 0302  NA 133* 136  K 4.5 4.0  CL 99* 104  CO2 25 25  GLUCOSE 98 116*  BUN 20 20  CREATININE 0.92 0.86  CALCIUM 9.7 9.0  MG 2.0  --     Liver Function Tests:  Recent Labs Lab 03/12/17 1750  AST 41  ALT 22  ALKPHOS 60  BILITOT 1.5*   PROT 7.3  ALBUMIN 4.5   No results for input(s): LIPASE, AMYLASE in the last 168 hours. No results for input(s): AMMONIA in the last 168 hours.  CBC:  Recent Labs Lab 03/12/17 1750 03/13/17 0302  WBC 4.3 5.0  HGB 14.9 13.4  HCT 43.4 39.1  MCV 97.6 98.0  PLT 222 200    Cardiac Enzymes:  Recent Labs Lab 03/12/17 2127 03/13/17 0302 03/13/17 0859  TROPONINI 0.04* 0.06* 0.03*    BNP: Invalid input(s): POCBNP  CBG: No results for input(s): GLUCAP in the last 168 hours.  Microbiology: Results for orders placed or performed during the hospital encounter of 10/08/16  MRSA PCR Screening     Status: None   Collection Time: 10/08/16 10:24 PM  Result Value Ref Range Status   MRSA by PCR NEGATIVE NEGATIVE Final    Comment:        The GeneXpert MRSA Assay (FDA approved for NASAL specimens only), is one component of a comprehensive MRSA colonization surveillance program. It is not intended to diagnose MRSA infection nor to guide or monitor treatment for MRSA infections.     Coagulation Studies:  Recent Labs  03/12/17 1810  LABPROT 14.6  INR 1.13    Urinalysis:  Recent Labs Lab 03/12/17 1751  COLORURINE YELLOW*  LABSPEC 1.004*  PHURINE 7.0  GLUCOSEU NEGATIVE  HGBUR NEGATIVE  BILIRUBINUR NEGATIVE  KETONESUR NEGATIVE  PROTEINUR NEGATIVE  NITRITE NEGATIVE  LEUKOCYTESUR NEGATIVE    Lipid Panel:    Component Value Date/Time   CHOL 195 01/07/2017 1025   TRIG 73.0 01/07/2017 1025   HDL 57.20 01/07/2017 1025   CHOLHDL 3 01/07/2017 1025   VLDL 14.6 01/07/2017 1025   LDLCALC 124 (H) 01/07/2017 1025    HgbA1C: No results found for: HGBA1C  Urine Drug Screen:     Component Value Date/Time   LABOPIA NONE DETECTED 03/12/2017 1821   COCAINSCRNUR NONE DETECTED 03/12/2017 1821   LABBENZ NONE DETECTED 03/12/2017 1821   AMPHETMU NONE DETECTED 03/12/2017 1821   THCU NONE DETECTED 03/12/2017 1821   LABBARB NONE DETECTED 03/12/2017 1821    Alcohol  Level:  Recent Labs Lab 03/12/17 1810  ETH <5    Other results: EKG: atrial fibrillation, rate 131 bpm.  Imaging: Mr Brain Wo Contrast  Result Date: 03/13/2017 CLINICAL DATA:  Altered mental status yesterday. Repeating phrases. Chronic atrial fibrillation. EXAM: MRI HEAD WITHOUT CONTRAST TECHNIQUE: Multiplanar, multiecho pulse sequences of the brain and surrounding structures were obtained without intravenous contrast. COMPARISON:  Head CT 03/12/2017 FINDINGS: Brain: Diffusion imaging does not show any acute or subacute infarction. The brainstem and cerebellum are normal. Cerebral hemispheres show mild age related volume loss with minimal small vessel change in the white matter, frontal lobe predominant. No cortical or large vessel  territory infarction. No mass lesion, hemorrhage, hydrocephalus or extra-axial collection. No pituitary mass. Vascular: Major vessels at the base of the brain show flow. Skull and upper cervical spine: Negative Sinuses/Orbits: Clear/normal Other: None significant IMPRESSION: Minimal brain volume loss, typical of age. Minimal small vessel change of the hemispheric white matter, less than often seen in healthy individuals of this age. No acute or reversible finding. Electronically Signed   By: Nelson Chimes M.D.   On: 03/13/2017 11:34   US Carotid Bilateral  Result Date: 03/12/2017 CLINICAL DATA:  TIA, altered mental status. EXAM: BILATERAL CAROTID DUPLEX ULTRASOUND TECHNIQUE: Pearline Cables scale imaging, color Doppler and duplex ultrasound were performed of bilateral carotid and vertebral arteries in the neck. COMPARISON:  Head CT 03/12/2017 FINDINGS: Criteria: Quantification of carotid stenosis is based on velocity parameters that correlate the residual internal carotid diameter with NASCET-based stenosis levels, using the diameter of the distal internal carotid lumen as the denominator for stenosis measurement. The following velocity measurements were obtained: RIGHT ICA:  109  cm/sec CCA:  83 cm/sec SYSTOLIC ICA/CCA RATIO:  1.3 DIASTOLIC ICA/CCA RATIO:  3.0 ECA:  135 cm/sec LEFT ICA:  100 cm/sec CCA:  211 cm/sec SYSTOLIC ICA/CCA RATIO:  1.0 DIASTOLIC ICA/CCA RATIO:  2.0 ECA:  184 cm/sec RIGHT CAROTID ARTERY: Common carotid artery demonstrates mild intimal thickening with minimal focal plaque over the mid segment. Minimal calcified plaque at the right carotid bulb. No significant plaque over the internal carotid artery. Doppler waveforms within normal. RIGHT VERTEBRAL ARTERY:  Normal antegrade flow. LEFT CAROTID ARTERY: Minimal intimal thickening of the common carotid artery. Mild calcified plaque over the distal segment of the common carotid artery. Somewhat heterogeneous calcified plaque at the carotid bulb and proximal internal carotid artery. Normal Doppler waveforms. LEFT VERTEBRAL ARTERY:  Normal antegrade flow. IMPRESSION: Moderate atherosclerotic plaque over the left carotid bulb and origin of the left internal carotid artery. Very minimal calcified plaque over the right carotid bulb. No hemodynamically significant stenosis of the internal carotid arteries. Normal antegrade flow within the vertebral arteries. Electronically Signed   By: Marin Olp M.D.   On: 03/12/2017 21:10   Ct Head Code Stroke Wo Contrast`  Addendum Date: 03/12/2017   ADDENDUM REPORT: 03/12/2017 19:06 ADDENDUM: These results were called by telephone at the time of interpretation on 03/12/2017 at 6:40 Pm to Dr. Rudene Re , who verbally acknowledged these results. Electronically Signed   By: Ulyses Jarred M.D.   On: 03/12/2017 19:06   Result Date: 03/12/2017 CLINICAL DATA:  Code stroke.  4-5 days memory loss EXAM: CT HEAD WITHOUT CONTRAST TECHNIQUE: Contiguous axial images were obtained from the base of the skull through the vertex without intravenous contrast. COMPARISON:  None. FINDINGS: Brain: No mass lesion, intraparenchymal hemorrhage or extra-axial collection. No evidence of acute cortical  infarct. Brain parenchyma and CSF-containing spaces are normal for age. Vascular: No hyperdense vessel or unexpected calcification. Skull: Normal visualized skull base, calvarium and extracranial soft tissues. Sinuses/Orbits: No sinus fluid levels or advanced mucosal thickening. No mastoid effusion. Normal orbits. ASPECTS [facility] Stroke Program Early CT Score) - Ganglionic level infarction (caudate, lentiform nuclei, internal capsule, insula, M1-M3 cortex): 7 - Supraganglionic infarction (M4-M6 cortex): 3 Total score (0-10 with 10 being normal): 10 IMPRESSION: 1. Normal CT of the brain for age. 2. ASPECTS is 10. Electronically Signed: By: Ulyses Jarred M.D. On: 03/12/2017 18:35    Assessment: 81 y.o. female with a history of atrial fibrillation on Eliquis and Plavix who presents with an episode  of amnesia.  MRI of the brain reviewed and unremarkable.  Cardiology has seen the patient and feel that it is unlikely that this is secondary to her atrial fibrillation.  Description very consistent with TGA.  TIA less likely.    Stroke Risk Factors - atrial fibrillation and hypertension  Plan: 1. EEG. May be performed as an outpatient 2. Continue Plavix and Eliquis    Alexis Goodell, MD Neurology [phone] 03/13/2017, 2:11 PM

## 2017-03-13 NOTE — Discharge Summary (Signed)
[place] at [facility], [facility] 81 y.o., DOB 03-29-1936, MRN [id number]. Admission date: 03/12/2017 Discharge Date 03/13/2017 Primary MD [name], MD Admitting Physician Gladstone Lighter, MD  Admission Diagnosis  Transient global amnesia [G45.4] TIA (transient ischemic attack) [G45.9] Confusion [R41.0] V-tach [facility]) [I47.2] Atrial fibrillation with RVR ([place]) [I48.91]  Discharge Diagnosis   Active Problems:  Amnesia due to TIA Atrial fibrillation Coronary artery disease status post CABG GERD Arthritis Essential hypertension Essential hypertension     Hospital Course   Charlotte Wagner  is a 81 y.o. female with a known history of CAD status post CABG, arthritis, GERD, hypertension, CKD with the resident of [facility] assisted living presents to the hospital secondary to acute confusion this afternoon. Patient was admitted for further evaluation. CT scan of the head was negative in the emergency room. Patient continued to have difficulty with memory however subsequently she regained her memory. And is able to recall everything. She underwent MRI of the brain which failed to show a CVA. Carotid Doppler showed plaque. Patient already on Plavix and eliquis we will continue.             Consults  None  Significant Tests:  See full reports for all details     Mr Brain Wo Contrast  Result Date: 03/13/2017 CLINICAL DATA:  Altered mental status yesterday. Repeating phrases. Chronic atrial fibrillation. EXAM: MRI HEAD WITHOUT CONTRAST TECHNIQUE: Multiplanar, multiecho pulse sequences of the brain and surrounding structures were obtained without intravenous contrast. COMPARISON:  Head CT 03/12/2017 FINDINGS: Brain: Diffusion imaging does not show any acute or subacute infarction. The brainstem and cerebellum are normal. Cerebral hemispheres show mild age related volume loss with minimal small vessel change in the white matter, frontal lobe  predominant. No cortical or large vessel territory infarction. No mass lesion, hemorrhage, hydrocephalus or extra-axial collection. No pituitary mass. Vascular: Major vessels at the base of the brain show flow. Skull and upper cervical spine: Negative Sinuses/Orbits: Clear/normal Other: None significant IMPRESSION: Minimal brain volume loss, typical of age. Minimal small vessel change of the hemispheric white matter, less than often seen in healthy individuals of this age. No acute or reversible finding. Electronically Signed   By: Nelson Chimes M.D.   On: 03/13/2017 11:34   US Carotid Bilateral  Result Date: 03/12/2017 CLINICAL DATA:  TIA, altered mental status. EXAM: BILATERAL CAROTID DUPLEX ULTRASOUND TECHNIQUE: Pearline Cables scale imaging, color Doppler and duplex ultrasound were performed of bilateral carotid and vertebral arteries in the neck. COMPARISON:  Head CT 03/12/2017 FINDINGS: Criteria: Quantification of carotid stenosis is based on velocity parameters that correlate the residual internal carotid diameter with NASCET-based stenosis levels, using the diameter of the distal internal carotid lumen as the denominator for stenosis measurement. The following velocity measurements were obtained: RIGHT ICA:  109 cm/sec CCA:  83 cm/sec SYSTOLIC ICA/CCA RATIO:  1.3 DIASTOLIC ICA/CCA RATIO:  3.0 ECA:  135 cm/sec LEFT ICA:  100 cm/sec CCA:  563 cm/sec SYSTOLIC ICA/CCA RATIO:  1.0 DIASTOLIC ICA/CCA RATIO:  2.0 ECA:  184 cm/sec RIGHT CAROTID ARTERY: Common carotid artery demonstrates mild intimal thickening with minimal focal plaque over the mid segment. Minimal calcified plaque at the right carotid bulb. No significant plaque over the internal carotid artery. Doppler waveforms within normal. RIGHT VERTEBRAL ARTERY:  Normal antegrade flow. LEFT CAROTID ARTERY: Minimal intimal thickening of the common carotid artery. Mild calcified plaque over the distal segment of the common carotid artery. Somewhat heterogeneous calcified  plaque at the carotid bulb and proximal internal carotid artery. Normal Doppler waveforms. LEFT VERTEBRAL ARTERY:  Normal antegrade flow. IMPRESSION: Moderate atherosclerotic plaque over the left carotid bulb and origin of the left internal carotid artery. Very minimal calcified plaque over the right carotid bulb. No hemodynamically significant stenosis of the internal carotid arteries. Normal antegrade flow within the vertebral arteries. Electronically Signed   By: Marin Olp M.D.   On: 03/12/2017 21:10   Ct Head Code Stroke Wo Contrast`  Addendum Date: 03/12/2017   ADDENDUM REPORT: 03/12/2017 19:06 ADDENDUM: These results were called by telephone at the time of interpretation on 03/12/2017 at 6:40 Pm to Dr. Rudene Re , who verbally acknowledged these results. Electronically Signed   By: Ulyses Jarred M.D.   On: 03/12/2017 19:06   Result Date: 03/12/2017 CLINICAL DATA:  Code stroke.  4-5 days memory loss EXAM: CT HEAD WITHOUT CONTRAST TECHNIQUE: Contiguous axial images were obtained from the base of the skull through the vertex without intravenous contrast. COMPARISON:  None. FINDINGS: Brain: No mass lesion, intraparenchymal hemorrhage or extra-axial collection. No evidence of acute cortical infarct. Brain parenchyma and CSF-containing spaces are normal for age. Vascular: No hyperdense vessel or unexpected calcification. Skull: Normal visualized skull base, calvarium and extracranial soft tissues. Sinuses/Orbits: No sinus fluid levels or advanced mucosal thickening. No mastoid effusion. Normal orbits. ASPECTS [facility] Stroke Program Early CT Score) - Ganglionic level infarction (caudate, lentiform nuclei, internal capsule, insula, M1-M3 cortex): 7 - Supraganglionic infarction (M4-M6 cortex): 3 Total score (0-10 with 10 being normal): 10 IMPRESSION: 1. Normal CT of the brain for age. 2. ASPECTS is 10. Electronically Signed: By: Ulyses Jarred M.D. On: 03/12/2017 18:35       Today   Subjective:    Charlotte Wagner  Asians back to baseline. Currently has no complaint  Objective:   Blood pressure 124/71, pulse 88, temperature 97.8 F (36.6 C), temperature source Oral, resp. rate 18, height 5\' 3"  (1.6 m), weight 183 lb (83 kg), SpO2 96 %.  .  Intake/Output Summary (Last 24 hours) at 03/13/17 1230 Last data filed at 03/13/17 0800  Gross per 24 hour  Intake              240 ml  Output                0 ml  Net              240 ml    Exam VITAL SIGNS: Blood pressure 124/71, pulse 88, temperature 97.8 F (36.6 C), temperature source Oral, resp. rate 18, height 5\' 3"  (1.6 m), weight 183 lb (83 kg), SpO2 96 %.  GENERAL:  81 y.o.-year-old patient lying in the bed with no acute distress.  EYES: Pupils equal, round, reactive to light and accommodation. No scleral icterus. Extraocular muscles intact.  HEENT: Head atraumatic, normocephalic. Oropharynx and nasopharynx clear.  NECK:  Supple, no jugular venous distention. No thyroid enlargement, no tenderness.  LUNGS: Normal breath sounds bilaterally, no wheezing, rales,rhonchi or crepitation. No use of accessory muscles of respiration.  CARDIOVASCULAR: S1, S2 normal. No murmurs, rubs, or gallops.  ABDOMEN: Soft, nontender, nondistended. Bowel sounds present. No organomegaly or mass.  EXTREMITIES: No pedal edema, cyanosis, or clubbing.  NEUROLOGIC: Cranial nerves II through XII are intact. Muscle strength 5/5 in all extremities. Sensation intact. Gait not checked.  PSYCHIATRIC: The patient is alert and oriented x 3.  SKIN: No obvious rash, lesion, or ulcer.   Data Review  CBC w Diff: Lab Results  Component Value Date   WBC 5.0 03/13/2017   HGB 13.4 03/13/2017   HGB 12.8 02/28/2017   HCT 39.1 03/13/2017   HCT 38.3 02/28/2017   PLT 200 03/13/2017   PLT 187 02/28/2017   LYMPHOPCT 13 10/08/2016   MONOPCT 17 10/08/2016   EOSPCT 1 10/08/2016   BASOPCT 0 10/08/2016   CMP: Lab Results  Component Value Date   NA 136 03/13/2017   NA  135 02/28/2017   K 4.0 03/13/2017   CL 104 03/13/2017   CO2 25 03/13/2017   BUN 20 03/13/2017   BUN 21 02/28/2017   CREATININE 0.86 03/13/2017   PROT 7.3 03/12/2017   ALBUMIN 4.5 03/12/2017   BILITOT 1.5 (H) 03/12/2017   ALKPHOS 60 03/12/2017   AST 41 03/12/2017   ALT 22 03/12/2017  .  Micro Results No results found for this or any previous visit (from the past 240 hour(s)).      Code Status Orders        Start     Ordered   03/12/17 2113  Full code  Continuous     03/12/17 2113    Code Status History    Date Active Date Inactive Code Status Order ID Comments User Context   10/08/2016  6:34 PM 10/10/2016  3:26 PM Full Code [id number]  [name], MD ED   06/07/2016 10:22 AM 06/07/2016  5:24 PM Full Code [id number]  Nicholes Mango, MD Inpatient   06/06/2016 10:11 PM 06/07/2016 10:21 AM DNR [id number]  Max Sane, MD ED   06/01/2016 12:19 AM 06/01/2016  1:56 PM Full Code [id number]  Lance Coon, MD Inpatient    Advance Directive Documentation     Most Recent Value  Type of Advance Directive  Living will  Pre-existing out of facility DNR order (yellow form or pink MOST form)  -  "MOST" Form in Place?  -          Follow-up Information    [name], MD Follow up in 1 week(s).   Specialty:  Internal Medicine Contact information: [address] [place] [address] [phone]           Discharge Medications   Allergies as of 03/13/2017      Reactions   Beta Adrenergic Blockers Shortness Of Breath   DEPRESSION/HYPOTENSION   Brilinta [ticagrelor] Shortness Of Breath   Caused AFIB   Albuterol    rigors   Aspirin Other (See Comments)   Heartburn    Nsaids Other (See Comments)   ULCER HISTORY & CURRENTLY ON BLOOD THINNERS   Penicillins Hives, Other (See Comments)   Has patient had a PCN reaction causing immediate rash, facial/tongue/throat swelling, SOB or lightheadedness with hypotension: No Has patient had a PCN reaction causing  severe rash involving mucus membranes or skin necrosis: No Has patient had a PCN reaction that required hospitalization No Has patient had a PCN reaction occurring within the last 10 years: No If all of the above answers are "NO", then may proceed with Cephalosporin use.   Statins Other (See Comments)   Muscle weakness   Sulfa Antibiotics Rash   At mouth   Tetracycline Rash   Tetracyclines & Related Rash      Medication List    TAKE these medications   acetaminophen 500 MG tablet Commonly known as:  TYLENOL Take 1,000 mg by mouth 2 (two) times daily.   amitriptyline 25 MG tablet Commonly known as:  ELAVIL Take 1-2 tablets (25-50 mg total) by mouth at bedtime. What changed:  how much to take   b complex vitamins tablet Take 1 tablet by mouth daily.   bisoprolol 5 MG tablet Commonly known as:  ZEBETA Take 2.5 mg by mouth at bedtime.   clopidogrel 75 MG tablet Commonly known as:  PLAVIX take 1 tablet by mouth once daily   CoQ-10 100 MG Caps Take 100 mg by mouth daily.   ELIQUIS 5 MG Tabs tablet Generic drug:  apixaban take 1 tablet by mouth twice a day   ezetimibe 10 MG tablet Commonly known as:  ZETIA take 1 tablet by mouth once daily   FISH OIL PO Take 2,000 mg by mouth daily.   Fluticasone-Salmeterol 250-50 MCG/DOSE Aepb Commonly known as:  ADVAIR Inhale 1 puff into the lungs 2 (two) times daily as needed (for asthmatic bronchitis after colds.).   folic acid 1 MG tablet Commonly known as:  FOLVITE take 1 tablet by mouth once daily   furosemide 20 MG tablet Commonly known as:  LASIX Take 0.5 tablets (10 mg total) by mouth daily. What changed:  how much to take  when to take this  reasons to take this   HAIR/SKIN/NAILS PO Take 1 tablet by mouth daily.   hydrocortisone 2.5 % cream apply to VULVA twice a day AT 10AM AND 4 PM What changed:  See the new instructions.   ipratropium 17 MCG/ACT inhaler Commonly known as:  ATROVENT HFA Inhale 1 puff  into the lungs every 4 (four) hours as needed (for asthmatic bronchitis after colds.).   ketoconazole 2 % cream Commonly known as:  NIZORAL Apply 1 application topically daily. What changed:  when to take this  reasons to take this   loratadine 10 MG tablet Commonly known as:  CLARITIN Take 10 mg by mouth daily.   meclizine 25 MG tablet Commonly known as:  ANTIVERT Take 25 mg by mouth 3 (three) times daily as needed for dizziness.   miconazole 2 % cream Commonly known as:  MICOTIN Apply 1 application topically at bedtime as needed (with hydrocortisone for lichen planus).   nitroGLYCERIN 0.4 MG SL tablet Commonly known as:  NITROSTAT Place 1 tablet (0.4 mg total) under the tongue every 5 (five) minutes as needed for chest pain. Place 1 tablet (0.4 mg total) under the tongue every 5 (five) minutes , 3 times as needed for chest pain.   pantoprazole 40 MG tablet Commonly known as:  PROTONIX take 1 tablet by mouth twice a day   polyethylene glycol powder powder Commonly known as:  GLYCOLAX/MIRALAX Take 8.5 g by mouth once.   ramipril 10 MG capsule Commonly known as:  ALTACE take 1 capsule by mouth once daily What changed:  See the new instructions.   spironolactone 25 MG tablet Commonly known as:  ALDACTONE take 1 tablet by mouth every morning   traMADol 50 MG tablet Commonly known as:  ULTRAM Take 1 tablet (50 mg total) by mouth daily as needed.   traZODone 150 MG tablet Commonly known as:  DESYREL take 1 and 2/3-2 tablets by mouth at bedtime as directed What changed:  See the new instructions.   verapamil 180 MG CR tablet Commonly known as:  CALAN-SR Take 1 tablet (180 mg total) by mouth at bedtime. What changed:  when to take this   vitamin B-12 1000 MCG tablet Commonly known as:  CYANOCOBALAMIN Take 1,000 mcg by mouth daily.   vitamin C 500  MG tablet Commonly known as:  ASCORBIC ACID Take 500 mg by mouth at bedtime.   Vitamin D3 2000 units Tabs Take  2,000 Units by mouth daily.          Total Time in preparing paper work, data evaluation and todays exam - 35 minutes  Dustin Flock M.D on 03/13/2017 at 12:30 PM  [place]  [phone]

## 2017-03-13 NOTE — Consult Note (Signed)
Cardiology Consultation Note  Patient ID: Charlotte Wagner, Dr., MRN: [id number], DOB/AGE: 1936/01/02 81 y.o. Admit date: 03/12/2017   Date of Consult: 03/13/2017 Primary Physician: [name], MD Primary Cardiologist: Dr. Yvone Neu, MD Requesting Physician: Dr. 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 Tressia Miners, MD. Patient has a h/o CAD s/p CABG in 1998 (LIMA-LAD, SVG-Diag, SVG-LCx, SVG-RCA) s/p PCI/DES to SVG-RCA in 8/17 with remaining grafts patent, persistent Afib diagnosed in 10/2016 on Eliquis s/p successful DCCV x 2 on 03/08/2017, frequent PACs/PVCs, chronic diastolic CHF, anxiety, medication intolerance, hypertensive heart disease, HLD, and frequent heart burn who presented to [facility] on 03/12/17 with an episode of sudden confusion while doing an interview." She was noted to be back in Afib with RVR with rates in the 110s to 120s bpm.   Most recent LHC in 05/2016 in the setting of unstable angina showed 90% stenosis of SVG-RCA otherwise patent grafts. Details below. She noted continued dyspnea which she has related to Brilinta and was ultimately changed to Plavix which she states only causes mild SOB. Because of her ongoing SOB she underwent nuclear stress test in 08/2016 that was low risk. Previously noted frequent palpitations, undergoing Holter monitor in 08/2016 that showed overall NSR with heart rate ranging from 43-82 bpm, supraventricular ectopy: 61 isolated PACs. One 4 beat run of SVT, likely atrial tachycardia, 110 BPM. Ventricular ectopy was 15% of the total number of beats: 12,062 isolated PVCs. 54 ventricular couplets. 6165 ventricular bigeminal cycles. She has been mostly intolerant to beta blockers, thus her verapamil has been titrated. Admitted in 10/2016 with new onset Afib. Started on Eliquis and continued on  Plavix given her recent PCI as above. Overall, she was mostly tolerating her Afib up until spring 2018 when she noted more fatigue and SOB with exertion. She was referred to Dr. Caryl Comes for further evaluation. He recommended, given her moderately dilated left atrium, she would be less likely to hold sinus rhythm s/p DCCV, though felt like she should at least try. She underwent DCCV on 03/08/2017 with conversion to NSR after a 2nd shock. She was continued on verapamil and bisoprolol. On 6/5 she was doing an interview when suddenly she became acutely confused. Never with LOC, slurred speech, or focal deficit. She reports she could communicate with others, but does not recall their conversations. She reported being alert and oriented by knowing their names. She was [facility] where head CT was not acute, MRI brain not acute, carotid doppler with moderate plaque over the left carotid bulb and origin of the LICA. Minimal calcified plaque of the right carotid bulb. She was note to be back in Afib with RVR with heart rates in the 110s to 120s. Her verapamil was increased to 180 mg bid with improvement in her heart rate. No further confusions and she notes some of the above symptoms are becoming "clearer to me." She has been seen by neurology who feels symptoms may be related to transient global amnesia with TIA being less likely.   Labs showed a minimal troponin leak of 0.06-->0.03, K+ 4.5, SCr 0.92, TSH normal, UDS positive for TCAs, ETOH negative, Mg++ 2.0. Currently, asymptomatic.   Past Medical History:  Diagnosis Date  . Allergy   . Arthritis    s/p bilateral knees and left hip replacement  .  Asthma   . CAD (coronary artery disease)    a. 12/1996 s/p CABG x4 ([place]);  b. 2005 Pt reports stress test & cath, which revealed patent grafts.  . Cancer ([place])    melanoma right arm  . Carotid arterial disease ([place])    a. 04/2015 Carotid U/S: <50% bilat ICA stenosis.  . Chronic kidney disease   . Colon  polyps    H/O  . Depression   . GERD (gastroesophageal reflux disease)    h/o hiatal hernia  . Headache    migraines in past  . Heart murmur    a. 04/2011 Echo: EF 55-60%, bilat atrial enlargement, mild to mod TR.  [name] History of chicken pox   . History of hiatal hernia   . Hx of migraines    rare now  . Hx: UTI (urinary tract infection)   . Hyperlipidemia    a. Statin intolerant -->on zetia.  . Hypertensive heart disease   . Lichen planus   . Melanoma ([place])   . Palpitations    a. rare PVC's and h/o SVT.  [name] PMR (polymyalgia rheumatica) (HCC)    h/o in setting of crestor usage.  [name] PSVT (paroxysmal supraventricular tachycardia) ([place])   . PUD (peptic ulcer disease)    remote history  . Raynaud's phenomenon   . Spinal stenosis   . Urine incontinence    H/O  . Vitamin D deficiency       Most Recent Cardiac Studies: LHC 05/2016: Conclusion     Prox LAD lesion, 100 %stenosed.  Ost Cx to Prox Cx lesion, 50 %stenosed.  Ost LM to LM lesion, 70 %stenosed.  Mid RCA lesion, 100 %stenosed.  Prox RCA lesion, 60 %stenosed.  SVG graft was visualized by angiography and is normal in caliber.  Post Atrio lesion, 60 %stenosed.  SVG graft was visualized by angiography and is normal in caliber.  The graft exhibits mild .  The flow in the graft is reversed.  Origin lesion, 30 %stenosed.  SVG graft was visualized by angiography.  Prox Graft lesion, 30 %stenosed.  LIMA graft was visualized by angiography and is normal in caliber and anatomically normal.  The left ventricular systolic function is normal.  LV end diastolic pressure is mildly elevated.  The left ventricular ejection fraction is 55-65% by visual estimate.  A STENT XIENCE ALPINE RX [id number] drug eluting stent was successfully placed, and does not overlap previously placed stent.  Prox Graft lesion, 90 %stenosed.  Post intervention, there is a 0% residual stenosis.   1. Severe underlying three-vessel  coronary artery disease with patent grafts including LIMA to LAD, SVG to diagonal, SVG to left circumflex and SVG to RCA. Severe 90% stenosis and SVG to RCA is likely the culprit for unstable angina.  2. Normal LV systolic function and mildly elevated left ventricular end-diastolic pressure.  3. Successful drug-eluting stent placement to the SVG to RCA using a distal protection device.  Recommendations: The patient is severely intolerant to aspirin and she reports inability to take the medication. Thus, I elected to treat her with Brilinta monotherapy. Continue aggressive treatment for coronary artery disease.  Holter 08/2016: Overall rhythm was sinus. Heart rate ranged from 43-82 bpm, average of 53 BPM.  Supraventricular ectopy: 61 isolated PACs. One 4 beat run of SVT, likely atrial tachycardia, 110 BPM.  Ventricular ectopy was 15% of the total number of beats: 12,062 isolated PVCs. 54 ventricular couplets. 6165 ventricular bigeminal cycles.   TTE 10/2016: Study Conclusions  -  Left ventricle: The cavity size was normal. Systolic function was   normal. The estimated ejection fraction was in the range of 60%   to 65%. Wall motion was normal; there were no regional wall   motion abnormalities. The study is not technically sufficient to   allow evaluation of LV diastolic function. - Mitral valve: There was mild regurgitation. - Left atrium: The atrium was moderately dilated. - Right ventricle: The cavity size was mildly dilated. Wall   thickness was normal. Systolic function was mildly reduced. - Pulmonary arteries: Systolic pressure was mild to moderately   elevated. PA peak pressure: 49 mm Hg (S).  Impressions:  - Rhythm is atrial fibrillation.     Surgical History:  Past Surgical History:  Procedure Laterality Date  . ADENOIDECTOMY     age 33  . BACK SURGERY     L3-L5  . BILATERAL CARPAL TUNNEL RELEASE    . BREAST BIOPSY Right    bx x 3 neg  . BREAST SURGERY Right     biopsy x 3 (all benign)  . CARDIAC CATHETERIZATION N/A 06/01/2016   Procedure: LEFT HEART CATH AND CORS/GRAFTS ANGIOGRAPHY;  Surgeon: Wellington Hampshire, MD;  Location: [place] [facility];  Service: Cardiovascular;  Laterality: N/A;  . CARDIAC CATHETERIZATION N/A 06/01/2016   Procedure: Coronary Stent Intervention;  Surgeon: Wellington Hampshire, MD;  Location: [place] [facility];  Service: Cardiovascular;  Laterality: N/A;  . CARDIOVERSION N/A 03/08/2017   Procedure: CARDIOVERSION;  Surgeon: Wellington Hampshire, MD;  Location: [facility];  Service: Cardiovascular;  Laterality: N/A;  . CATARACT EXTRACTION W/PHACO Right 01/04/2016   Procedure: CATARACT EXTRACTION PHACO AND INTRAOCULAR LENS PLACEMENT ([place]);  Surgeon: Leandrew Koyanagi, MD;  Location: [place];  Service: Ophthalmology;  Laterality: Right;  MALYUGIN  . CATARACT EXTRACTION W/PHACO Left 01/25/2016   Procedure: CATARACT EXTRACTION PHACO AND INTRAOCULAR LENS PLACEMENT ([place]) left eye;  Surgeon: Leandrew Koyanagi, MD;  Location: [place];  Service: Ophthalmology;  Laterality: Left;  MALYUGIN SHUGARCAINE  . CHOLECYSTECTOMY  90's  . COLONOSCOPY WITH PROPOFOL N/A 05/03/2015   Procedure: COLONOSCOPY WITH PROPOFOL;  Surgeon: Lollie Sails, MD;  Location: [facility];  Service: Endoscopy;  Laterality: N/A;  . CORONARY ARTERY BYPASS GRAFT  98   4 vessel  . ESOPHAGOGASTRODUODENOSCOPY N/A 03/22/2015   Procedure: ESOPHAGOGASTRODUODENOSCOPY (EGD);  Surgeon: Lollie Sails, MD;  Location: [facility] ENDOSCOPY;  Service: Endoscopy;  Laterality: N/A;  . JOINT REPLACEMENT     BILATERAL KNEE REPLACEMENTS  . KNEE ARTHROSCOPY W/ OATS PROCEDURE     Lt knee (9/01), Rt knee (3/11), Lt hip (5/10)  . TOTAL HIP ARTHROPLASTY       Home Meds: Prior to Admission medications   Medication Sig Start Date End Date Taking? Authorizing Provider  acetaminophen (TYLENOL) 500 MG tablet Take 1,000 mg by mouth 2 (two) times daily.    Yes [provider]  amitriptyline (ELAVIL) 25 MG tablet Take 1-2 tablets (25-50 mg total) by mouth at bedtime. Patient taking differently: Take 25 mg by mouth at bedtime.  09/06/16  Yes [name], MD  b complex vitamins tablet Take 1 tablet by mouth daily.   Yes [provider]  bisoprolol (ZEBETA) 5 MG tablet Take 2.5 mg by mouth at bedtime.   Yes [provider]  Cholecalciferol (VITAMIN D3) 2000 UNITS TABS Take 2,000 Units by mouth daily.    Yes [provider]  clopidogrel (PLAVIX) 75 MG tablet take 1 tablet by mouth  once daily 02/05/17  Yes Wende Bushy, MD  Coenzyme Q10 (COQ-10) 100 MG CAPS Take 100 mg by mouth daily.    Yes [provider]  ELIQUIS 5 MG TABS tablet take 1 tablet by mouth twice a day 02/05/17  Yes Wende Bushy, MD  ezetimibe (ZETIA) 10 MG tablet take 1 tablet by mouth once daily 09/03/16  Yes [name], MD  Fluticasone-Salmeterol (ADVAIR) 250-50 MCG/DOSE AEPB Inhale 1 puff into the lungs 2 (two) times daily as needed (for asthmatic bronchitis after colds.).    Yes [provider]  folic acid (FOLVITE) 1 MG tablet take 1 tablet by mouth once daily 12/10/16  Yes [name], MD  furosemide (LASIX) 20 MG tablet Take 0.5 tablets (10 mg total) by mouth daily. Patient taking differently: Take 20 mg by mouth daily as needed (for weight gain of 2 lbs or more).  10/11/16 03/06/18 Yes Wende Bushy, MD  hydrocortisone 2.5 % cream apply to VULVA twice a day AT 10AM AND 4 PM Patient taking differently: apply to VULVA at bedtime as needed for with miconazole for lichen planus 10/11/41  Yes [name], MD  ipratropium (ATROVENT HFA) 17 MCG/ACT inhaler Inhale 1 puff into the lungs every 4 (four) hours as needed (for asthmatic bronchitis after colds.).    Yes [provider]  ketoconazole (NIZORAL) 2 % cream Apply 1 application topically daily. Patient taking differently: Apply 1 application topically daily as needed (for crack  bleeding fingers.).  06/15/14  Yes [name], MD  loratadine (CLARITIN) 10 MG tablet Take 10 mg by mouth daily.   Yes [provider]  meclizine (ANTIVERT) 25 MG tablet Take 25 mg by mouth 3 (three) times daily as needed for dizziness.   Yes [provider]  miconazole (MICOTIN) 2 % cream Apply 1 application topically at bedtime as needed (with hydrocortisone for lichen planus).    Yes [provider]  Multiple Vitamins-Minerals (HAIR/SKIN/NAILS PO) Take 1 tablet by mouth daily.   Yes [provider]  nitroGLYCERIN (NITROSTAT) 0.4 MG SL tablet Place 1 tablet (0.4 mg total) under the tongue every 5 (five) minutes as needed for chest pain. Place 1 tablet (0.4 mg total) under the tongue every 5 (five) minutes , 3 times as needed for chest pain. 06/07/16  Yes Gouru, Illene Silver, MD  Omega-3 Fatty Acids (FISH OIL PO) Take 2,000 mg by mouth daily.   Yes [provider]  pantoprazole (PROTONIX) 40 MG tablet take 1 tablet by mouth twice a day 01/30/17  Yes [name], MD  polyethylene glycol powder (GLYCOLAX/MIRALAX) powder Take 8.5 g by mouth once.   Yes [provider]  ramipril (ALTACE) 10 MG capsule take 1 capsule by mouth once daily Patient taking differently: take 1 capsule by mouth once at bedtime 11/16/16  Yes [name], MD  spironolactone (ALDACTONE) 25 MG tablet take 1 tablet by mouth every morning 02/28/17  Yes [name], MD  traMADol (ULTRAM) 50 MG tablet Take 1 tablet (50 mg total) by mouth daily as needed. 03/12/17  Yes [name], MD  traZODone (DESYREL) 150 MG tablet take 1 and 2/3-2 tablets by mouth at bedtime as directed Patient taking differently: Take 1.5 tablets (225 mg) by mouth at bedtime. 11/29/16  Yes [name], MD  verapamil (CALAN-SR) 180 MG CR tablet Take 1 tablet (180 mg total) by mouth at bedtime. Patient taking differently: Take 180 mg by mouth 2 (two) times daily.  02/28/17  Yes Deboraha Sprang, MD  vitamin B-12 (CYANOCOBALAMIN) 1000 MCG tablet Take 1,000 mcg by mouth daily.   Yes [provider]  vitamin C (ASCORBIC ACID) 500 MG tablet Take 500 mg by mouth at bedtime.   Yes [provider]    Inpatient Medications:  . amitriptyline  25 mg Oral QHS  . apixaban  5 mg Oral BID  . bisoprolol  2.5 mg Oral QHS  . cholecalciferol  2,000 Units Oral Daily  . clopidogrel  75 mg Oral Daily  . ezetimibe  10 mg Oral Daily  . folic acid  1 mg Oral Daily  . omega-3 acid ethyl esters  1 g Oral Daily  . pantoprazole  40 mg Oral BID  . polyethylene glycol  17 g Oral Daily  . ramipril  10 mg Oral Daily  . spironolactone  25 mg Oral q morning - 10a  . traZODone  225 mg Oral QHS  . verapamil  180 mg Oral BID     Allergies:  Allergies  Allergen Reactions  . Beta Adrenergic Blockers Shortness Of Breath    DEPRESSION/HYPOTENSION  . Brilinta [Ticagrelor] Shortness Of Breath    Caused AFIB  . Albuterol     rigors  . Aspirin Other (See Comments)    Heartburn   . Nsaids Other (See Comments)    ULCER HISTORY & CURRENTLY ON BLOOD THINNERS  . Penicillins Hives and Other (See Comments)    Has patient had a PCN reaction causing immediate rash, facial/tongue/throat swelling, SOB or lightheadedness with hypotension: No Has patient had a PCN reaction causing severe rash involving mucus membranes or skin necrosis: No Has patient had a PCN reaction that required hospitalization No Has patient had a PCN reaction occurring within the last 10 years: No If all of the above answers are "NO", then may proceed with Cephalosporin use.   . Statins Other (See Comments)    Muscle weakness  . Sulfa Antibiotics Rash    At mouth  . Tetracycline Rash  . Tetracyclines & Related Rash    Social History   Social History  . Marital status: Married    Spouse name: N/A  . Number of children: 3  . Years of education: N/A   Occupational History  . Retired Sport and exercise psychologist    Social History  Main Topics  . Smoking status: Former Research scientist (life sciences)  . Smokeless tobacco: Never Used     Comment: quit 44+ yrs ago  . Alcohol use No  . Drug use: No  . Sexual activity: No   Other Topics Concern  . Not on file   Social History Narrative   Lives in [place].  Retired Engineer, drilling.  Relatively active.     Family History  Problem Relation Age of Onset  . Thyroid disease Mother        graves disease  . Heart disease Father        rheumatic heart  . Alcohol abuse Father   . Depression Father   . Lung cancer Sister   . Depression Daughter   . Thyroid disease Daughter        hashimoto  . Depression Son   . Stroke Maternal Grandmother   . Depression Maternal Grandmother   . Hypertension Maternal Grandfather   . Hypertension Paternal Grandfather   . Seizures Son   . Breast cancer Neg Hx      Review of Systems: Review of Systems  Constitutional: Positive for malaise/fatigue. Negative for chills, diaphoresis, fever and weight loss.  HENT: Negative  for congestion.   Eyes: Negative for discharge and redness.  Respiratory: Positive for shortness of breath. Negative for cough, hemoptysis, sputum production and wheezing.   Cardiovascular: Negative for chest pain, palpitations, orthopnea, claudication, leg swelling and PND.  Gastrointestinal: Negative for abdominal pain, blood in stool, heartburn, melena, nausea and vomiting.  Genitourinary: Negative for hematuria.  Musculoskeletal: Negative for falls and myalgias.  Skin: Negative for rash.  Neurological: Negative for dizziness, tingling, tremors, sensory change, speech change, focal weakness, seizures, loss of consciousness, weakness and headaches.       Confusion  Endo/Heme/Allergies: Bruises/bleeds easily.  Psychiatric/Behavioral: Negative for substance abuse. The patient is not nervous/anxious.   All other systems reviewed and are negative.   Labs:  Recent Labs  03/12/17 2127 03/13/17 0302 03/13/17 0859  TROPONINI 0.04* 0.06*  0.03*   Lab Results  Component Value Date   WBC 5.0 03/13/2017   HGB 13.4 03/13/2017   HCT 39.1 03/13/2017   MCV 98.0 03/13/2017   PLT 200 03/13/2017     Recent Labs Lab 03/12/17 1750 03/13/17 0302  NA 133* 136  K 4.5 4.0  CL 99* 104  CO2 25 25  BUN 20 20  CREATININE 0.92 0.86  CALCIUM 9.7 9.0  PROT 7.3  --   BILITOT 1.5*  --   ALKPHOS 60  --   ALT 22  --   AST 41  --   GLUCOSE 98 116*   Lab Results  Component Value Date   CHOL 195 01/07/2017   HDL 57.20 01/07/2017   LDLCALC 124 (H) 01/07/2017   TRIG 73.0 01/07/2017   No results found for: DDIMER  Radiology/Studies:  Mr Brain Wo Contrast  Result Date: 03/13/2017 IMPRESSION: Minimal brain volume loss, typical of age. Minimal small vessel change of the hemispheric white matter, less than often seen in healthy individuals of this age. No acute or reversible finding. Electronically Signed   By: Nelson Chimes M.D.   On: 03/13/2017 11:34   US Carotid Bilateral  Result Date: 03/12/2017 IMPRESSION: Moderate atherosclerotic plaque over the left carotid bulb and origin of the left internal carotid artery. Very minimal calcified plaque over the right carotid bulb. No hemodynamically significant stenosis of the internal carotid arteries. Normal antegrade flow within the vertebral arteries. Electronically Signed   By: Marin Olp M.D.   On: 03/12/2017 21:10   Ct Head Code Stroke Wo Contrast`  Addendum Date: 03/12/2017   ADDENDUM REPORT: 03/12/2017 19:06 ADDENDUM: These results were called by telephone at the time of interpretation on 03/12/2017 at 6:40 Pm to Dr. Rudene Re , who verbally acknowledged these results. Electronically Signed   By: Ulyses Jarred M.D.   On: 03/12/2017 19:06   Result Date: 03/12/2017 IMPRESSION: 1. Normal CT of the brain for age. 2. ASPECTS is 10. Electronically Signed: By: Ulyses Jarred M.D. On: 03/12/2017 18:35    EKG: Interpreted by me showed: Afib with RVR, 131 bpm, low voltage along extremity  leads, nonspecific st/t changes Telemetry: Interpreted by me showed: Afib, 80s bom currently, 110s to 120s bpm prior  Weights: Filed Weights   03/12/17 1748 03/12/17 2112  Weight: 183 lb (83 kg) 183 lb (83 kg)     Physical Exam: Blood pressure 123/60, pulse 92, temperature 97.6 F (36.4 C), temperature source Oral, resp. rate 18, height 5\' 3"  (1.6 m), weight 183 lb (83 kg), SpO2 98 %. Body mass index is 32.42 kg/m. General: Well developed, well nourished, in no acute distress. Head: Normocephalic, atraumatic,  sclera non-icteric, no xanthomas, nares are without discharge.  Neck: Negative for carotid bruits. JVD not elevated. Lungs: Clear bilaterally to auscultation without wheezes, rales, or rhonchi. Breathing is unlabored. Heart: Irregularly irregular with S1 S2. No murmurs, rubs, or gallops appreciated. Abdomen: Soft, non-tender, non-distended with normoactive bowel sounds. No hepatomegaly. No rebound/guarding. No obvious abdominal masses. Msk:  Strength and tone appear normal for age. Extremities: No clubbing or cyanosis. No edema. Distal pedal pulses are 2+ and equal bilaterally. Neuro: Alert and oriented X 3. No facial asymmetry. No focal deficit. Moves all extremities spontaneously. Psych:  Responds to questions appropriately with a normal affect.    Assessment and Plan:  Principal Problem:   TGA (transient global amnesia) Active Problems:   CAD (coronary artery disease)   Dyspnea   Anxiety   Persistent atrial fibrillation (HCC)   Essential hypertension, benign    1. Confusion/possible TGA: -Presented with sudden acute confusion, though appears to have been oriented  -CT head, MRI brain negative, carotid doppler with moderate left carotid bulb plaque -Less likely a primary cardiac arrhythmia issue with her Afib as she denies missing any doses of her Eliquis -Unlikely to require TEE at this time, uncertain if this would even change her management  -EEG per  neuro -Continue dual therapy with Eliquis and Plavix  2. Persistent Afib: -Rate controlled currently -Seems to be responding well to verapamil 180 mg bid -Intolerant to BB -Eliquis for anticoagulation given her CHADS2VASc of at least 6 (CHF, HTN, age x 2, vascular disease, female) -Continue bisoprolol  -Less likely to hold sinus rhythm given her dilated left atrium -Not a good candidate for Flecainide given her CAD -Uncertain if amiodarone or Ranexa could be used for possible repeat DCCV, will defer to EP -Follow up with EP  3. CAD as above: -Troponin minimally elevated as above likely in the setting of her TGA -No ischemic changes noted -No plans for ischemic evaluation at this time -Continue Plavix through at least 05/2017 -On Eliquis in place of ASA (intolerant to ASA) -Intolerant to statins  4. HTN: -Controlled   Signed, Christell Faith, PA-C [place] Pager: [phone] 03/13/2017, 3:08 PM

## 2017-03-13 NOTE — Evaluation (Signed)
Physical Therapy Evaluation Patient Details Name: Charlotte Wagner, Dr. MRN: [id number] DOB: [birth year] Today's Date: 03/13/2017   History of Present Illness  Pt is a 81 y.o.femalewith a known history of CAD status post CABG, arthritis, GERD, hypertension, CKD with the resident of [facility] assisted living presents to the hospital secondary to acute confusion.  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 Her mobility is limited more from her scoliosis and also she was symptomatic with her persistent atrial fibrillation. She has been on eliquis and also Plavix. She has recently seen her cardiologist and had a successful cardioversion done on 03/08/2017, postprocedure EKG shows normal sinus rhythm with some atrial premature complexes. Patient was noted to be confused at around 4:00 this afternoon. Last known to be normal last night as one of her friends spoke with her. Patient has no memory of her recent cardioversion are off any events that happened in the last few days including up until she came to the hospital. She seems very confused of why she is in the hospital. After a few minutes she asks the same question again. Her friends were at bedside to mention that she is usually sharp and never like this. She denies any other neurological complaints. No tingling or numbness or weakness. No speech changes noted, no word finding difficulty or swallowing changes. CT of the head is negative at this time. She is noted to be in atrial fibrillation again.    Clinical Impression  Pt presents with mild deficits in strength, transfers, gait, balance, and activity tolerance.  Pt Ind with all bed mobility tasks without rails.  Pt required SBA with transfers with good effort and good initial stability upon standing.  Amb 30' with RW and SBA/CGA with flexed trunk posture, slow cadence, and some minimal drifting L/R but overall steady without LOB.  HR and SpO2 WNL on room air after amb  without c/o SOB.  Pt will benefit from HHPT services to address above deficits for decreased caregiver assistance and return to PLOF upon discharge.      Follow Up Recommendations Home health PT    Equipment Recommendations  None recommended by PT    Recommendations for Other Services       Precautions / Restrictions Precautions Precautions: Fall Restrictions Weight Bearing Restrictions: No      Mobility  Bed Mobility Overal bed mobility: Independent                Transfers Overall transfer level: Needs assistance Equipment used: Rolling walker (2 wheeled) Transfers: Sit to/from Stand Sit to Stand: Supervision         General transfer comment: Good effort and initial stability upon standing  Ambulation/Gait Ambulation/Gait assistance: Supervision Ambulation Distance (Feet): 30 Feet Assistive device: Rolling walker (2 wheeled) Gait Pattern/deviations: Trunk flexed;Decreased step length - left;Decreased step length - right   Gait velocity interpretation: Below normal speed for age/gender General Gait Details: Slow cadence with gait with flexed trunk posture and minimal drifting L/R but overall steady without LOB.  Stairs            Wheelchair Mobility    Modified Rankin (Stroke Patients Only)       Balance Overall balance assessment: Needs assistance Sitting-balance support: No upper extremity supported;Feet supported Sitting balance-Leahy Scale: Good     Standing balance support: Bilateral upper extremity supported Standing balance-Leahy Scale: Good  Pertinent Vitals/Pain Pain Assessment: 0-10 Pain Score: 3  Pain Location: Chronic BLE pain Pain Intervention(s): Monitored during session    Home Living Family/patient expects to be discharged to:: Other (Comment) ([place] at [facility]) Living Arrangements: Alone Available Help at Discharge: Friend(s);Available PRN/intermittently Type of Home:  Apartment Home Access: Level entry     Home Layout: One level Home Equipment: Walker - 4 wheels;Cane - single point      Prior Function Level of Independence: Independent with assistive device(s)         Comments: Pt Ind with amb community distances with rollator in home and SPC in community, no fall history     Hand Dominance   Dominant Hand: Right    Extremity/Trunk Assessment   Upper Extremity Assessment Upper Extremity Assessment: Overall WFL for tasks assessed    Lower Extremity Assessment Lower Extremity Assessment: Generalized weakness       Communication   Communication: No difficulties  Cognition Arousal/Alertness: Awake/alert Behavior During Therapy: WFL for tasks assessed/performed Overall Cognitive Status: Within Functional Limits for tasks assessed                                        General Comments      Exercises Total Joint Exercises Ankle Circles/Pumps: AROM;Both;10 reps Quad Sets: Strengthening;Both;10 reps Gluteal Sets: Strengthening;Both;10 reps Heel Slides: AROM;Both;10 reps Long Arc Quad: AROM;Both;10 reps Knee Flexion: AROM;Both;10 reps Marching in Standing: AROM;Both;5 reps Bridges: AROM;Both;5 reps   Assessment/Plan    PT Assessment Patient needs continued PT services  PT Problem List Decreased strength;Decreased activity tolerance;Decreased balance       PT Treatment Interventions Gait training;Balance training;Therapeutic exercise;Patient/family education    PT Goals (Current goals can be found in the Care Plan section)  Acute Rehab PT Goals Patient Stated Goal: To walk better PT Goal Formulation: With patient Time For Goal Achievement: 03/26/17 Potential to Achieve Goals: Good    Frequency Min 2X/week   Barriers to discharge        Co-evaluation               AM-PAC PT "6 Clicks" Daily Activity  Outcome Measure Difficulty turning over in bed (including adjusting bedclothes, sheets and  blankets)?: None Difficulty moving from lying on back to sitting on the side of the bed? : None Difficulty sitting down on and standing up from a chair with arms (e.g., wheelchair, bedside commode, etc,.)?: A Little Help needed moving to and from a bed to chair (including a wheelchair)?: A Little Help needed walking in hospital room?: A Little Help needed climbing 3-5 steps with a railing? : A Little 6 Click Score: 20    End of Session Equipment Utilized During Treatment: Gait belt Activity Tolerance: Patient tolerated treatment well Patient left: in bed;with bed alarm set Nurse Communication: Mobility status PT Visit Diagnosis: Muscle weakness (generalized) (M62.81);Difficulty in walking, not elsewhere classified (R26.2)    Time: 0347-4259 PT Time Calculation (min) (ACUTE ONLY): 37 min   Charges:   PT Evaluation $PT Eval Low Complexity: 1 Procedure PT Treatments $Therapeutic Exercise: 8-22 mins   PT G Codes:   PT G-Codes **NOT FOR INPATIENT CLASS** Functional Assessment Tool Used: AM-PAC 6 Clicks Basic Mobility Functional Limitation: Mobility: Walking and moving around Mobility: Walking and Moving Around Current Status (D6387): At least 20 percent but less than 40 percent impaired, limited or restricted Mobility: Walking and Moving  Around Goal Status [phone]): At least 1 percent but less than 20 percent impaired, limited or restricted     D. Scott Dalary Hollar PT, DPT 03/13/17, 11:45 AM

## 2017-03-13 NOTE — Progress Notes (Signed)
Dr Jannifer Franklin notified  stated no NIH  needed no Q2hr VS or Q2 Neuro checks to be done Q4hr only.

## 2017-03-14 ENCOUNTER — Other Ambulatory Visit: Payer: Self-pay | Admitting: Internal Medicine

## 2017-03-14 LAB — HEMOGLOBIN A1C
HEMOGLOBIN A1C: 5.6 % (ref 4.8–5.6)
Mean Plasma Glucose: 114 mg/dL

## 2017-03-15 ENCOUNTER — Encounter: Payer: Self-pay | Admitting: Internal Medicine

## 2017-03-15 ENCOUNTER — Telehealth: Payer: Self-pay | Admitting: Internal Medicine

## 2017-03-15 ENCOUNTER — Ambulatory Visit (INDEPENDENT_AMBULATORY_CARE_PROVIDER_SITE_OTHER): Payer: Medicare Other | Admitting: Internal Medicine

## 2017-03-15 DIAGNOSIS — I10 Essential (primary) hypertension: Secondary | ICD-10-CM

## 2017-03-15 DIAGNOSIS — E7800 Pure hypercholesterolemia, unspecified: Secondary | ICD-10-CM | POA: Diagnosis not present

## 2017-03-15 DIAGNOSIS — I481 Persistent atrial fibrillation: Secondary | ICD-10-CM | POA: Diagnosis not present

## 2017-03-15 DIAGNOSIS — F329 Major depressive disorder, single episode, unspecified: Secondary | ICD-10-CM | POA: Diagnosis not present

## 2017-03-15 DIAGNOSIS — G454 Transient global amnesia: Secondary | ICD-10-CM | POA: Diagnosis not present

## 2017-03-15 DIAGNOSIS — I25118 Atherosclerotic heart disease of native coronary artery with other forms of angina pectoris: Secondary | ICD-10-CM

## 2017-03-15 DIAGNOSIS — I4819 Other persistent atrial fibrillation: Secondary | ICD-10-CM

## 2017-03-15 DIAGNOSIS — I5032 Chronic diastolic (congestive) heart failure: Secondary | ICD-10-CM

## 2017-03-15 DIAGNOSIS — M4800 Spinal stenosis, site unspecified: Secondary | ICD-10-CM

## 2017-03-15 DIAGNOSIS — F32A Depression, unspecified: Secondary | ICD-10-CM

## 2017-03-15 NOTE — Progress Notes (Signed)
Pre-visit discussion using our clinic review tool. No additional management support is needed unless otherwise documented below in the visit note.  

## 2017-03-15 NOTE — Progress Notes (Signed)
Patient ID: Charlotte Wagner, Dr., female   DOB: [birth year], 81 y.o.   MRN: [id number]   Subjective:    Patient ID: Charlotte Wagner, Dr., female    DOB: [birth year], 81 y.o.   MRN: [id number]  HPI  Patient here for hospital follow up.  She was admitted 03/12/17 with acute confusion.  She had CT scan of the head, MRI of the brain - negative for CVA.  Carotid ultrasound - no hemodynamically significant stenosis.  Neurology evaluated.  Diagnosed with transient global amnesia.  Memory returned.  There is a block of time she cannot remember.  Feels like she is getting back to her normal.  States she still has some minimal word finding issues, but overall better.  Taking eliquis and plavix.  Her beta blocker was stopped.  She is breathing better.  No chest pain.  She is s/p cardioversion.  She is eating.  No nausea or vomiting.  No abdominal pain or cramping.  Bowels stable.    Past Medical History:  Diagnosis Date  . Allergy   . Arthritis    s/p bilateral knees and left hip replacement  . Asthma   . CAD (coronary artery disease)    a. 12/1996 s/p CABG x4 ([place]);  b. 2005 Pt reports stress test & cath, which revealed patent grafts.  . Cancer ([place])    melanoma right arm  . Carotid arterial disease ([place])    a. 04/2015 Carotid U/S: <50% bilat ICA stenosis.  . Chronic kidney disease   . Colon polyps    H/O  . Depression   . GERD (gastroesophageal reflux disease)    h/o hiatal hernia  . Headache    migraines in past  . Heart murmur    a. 04/2011 Echo: EF 55-60%, bilat atrial enlargement, mild to mod TR.  [name] History of chicken pox   . History of hiatal hernia   . Hx of migraines    rare now  . Hx: UTI (urinary tract infection)   . Hyperlipidemia    a. Statin intolerant -->on zetia.  . Hypertensive heart disease   . Lichen planus   . Melanoma ([place])   . Palpitations    a. rare PVC's and h/o SVT.  [name] PMR (polymyalgia rheumatica) (HCC)    h/o in setting of crestor usage.  [name] PSVT (paroxysmal  supraventricular tachycardia) ([place])   . PUD (peptic ulcer disease)    remote history  . Raynaud's phenomenon   . Spinal stenosis   . Urine incontinence    H/O  . Vitamin D deficiency    Past Surgical History:  Procedure Laterality Date  . ADENOIDECTOMY     age 50  . BACK SURGERY     L3-L5  . BILATERAL CARPAL TUNNEL RELEASE    . BREAST BIOPSY Right    bx x 3 neg  . BREAST SURGERY Right    biopsy x 3 (all benign)  . CARDIAC CATHETERIZATION N/A 06/01/2016   Procedure: LEFT HEART CATH AND CORS/GRAFTS ANGIOGRAPHY;  Surgeon: Wellington Hampshire, MD;  Location: [place] [facility];  Service: Cardiovascular;  Laterality: N/A;  . CARDIAC CATHETERIZATION N/A 06/01/2016   Procedure: Coronary Stent Intervention;  Surgeon: Wellington Hampshire, MD;  Location: [place] [facility];  Service: Cardiovascular;  Laterality: N/A;  . CARDIOVERSION N/A 03/08/2017   Procedure: CARDIOVERSION;  Surgeon: Wellington Hampshire, MD;  Location: [facility];  Service: Cardiovascular;  Laterality: N/A;  . CATARACT EXTRACTION W/PHACO Right 01/04/2016  Procedure: CATARACT EXTRACTION PHACO AND INTRAOCULAR LENS PLACEMENT (IOC);  Surgeon: Leandrew Koyanagi, MD;  Location: [place];  Service: Ophthalmology;  Laterality: Right;  MALYUGIN  . CATARACT EXTRACTION W/PHACO Left 01/25/2016   Procedure: CATARACT EXTRACTION PHACO AND INTRAOCULAR LENS PLACEMENT ([place]) left eye;  Surgeon: Leandrew Koyanagi, MD;  Location: [place];  Service: Ophthalmology;  Laterality: Left;  MALYUGIN SHUGARCAINE  . CHOLECYSTECTOMY  90's  . COLONOSCOPY WITH PROPOFOL N/A 05/03/2015   Procedure: COLONOSCOPY WITH PROPOFOL;  Surgeon: Lollie Sails, MD;  Location: [facility];  Service: Endoscopy;  Laterality: N/A;  . CORONARY ARTERY BYPASS GRAFT  98   4 vessel  . ESOPHAGOGASTRODUODENOSCOPY N/A 03/22/2015   Procedure: ESOPHAGOGASTRODUODENOSCOPY (EGD);  Surgeon: Lollie Sails, MD;  Location: [facility] ENDOSCOPY;  Service: Endoscopy;   Laterality: N/A;  . JOINT REPLACEMENT     BILATERAL KNEE REPLACEMENTS  . KNEE ARTHROSCOPY W/ OATS PROCEDURE     Lt knee (9/01), Rt knee (3/11), Lt hip (5/10)  . TOTAL HIP ARTHROPLASTY     Family History  Problem Relation Age of Onset  . Thyroid disease Mother        graves disease  . Heart disease Father        rheumatic heart  . Alcohol abuse Father   . Depression Father   . Lung cancer Sister   . Depression Daughter   . Thyroid disease Daughter        hashimoto  . Depression Son   . Stroke Maternal Grandmother   . Depression Maternal Grandmother   . Hypertension Maternal Grandfather   . Hypertension Paternal Grandfather   . Seizures Son   . Breast cancer Neg Hx    Social History   Social History  . Marital status: Married    Spouse name: N/A  . Number of children: 3  . Years of education: N/A   Occupational History  . Retired Sport and exercise psychologist    Social History Main Topics  . Smoking status: Former Research scientist (life sciences)  . Smokeless tobacco: Never Used     Comment: quit 44+ yrs ago  . Alcohol use No  . Drug use: No  . Sexual activity: No   Other Topics Concern  . None   Social History Narrative   Lives in [place].  Retired Engineer, drilling.  Relatively active.    Outpatient Encounter Prescriptions as of 03/15/2017  Medication Sig  . acetaminophen (TYLENOL) 500 MG tablet Take 1,000 mg by mouth 2 (two) times daily.   [name] amitriptyline (ELAVIL) 25 MG tablet Take 1-2 tablets (25-50 mg total) by mouth at bedtime. (Patient taking differently: Take 25 mg by mouth at bedtime. )  . b complex vitamins tablet Take 1 tablet by mouth daily.  . Cholecalciferol (VITAMIN D3) 2000 UNITS TABS Take 2,000 Units by mouth daily.   . clopidogrel (PLAVIX) 75 MG tablet take 1 tablet by mouth once daily  . Coenzyme Q10 (COQ-10) 100 MG CAPS Take 100 mg by mouth daily.   [name] ELIQUIS 5 MG TABS tablet take 1 tablet by mouth twice a day  . ezetimibe (ZETIA) 10 MG tablet take 1 tablet by mouth once daily  .  Fluticasone-Salmeterol (ADVAIR) 250-50 MCG/DOSE AEPB Inhale 1 puff into the lungs 2 (two) times daily as needed (for asthmatic bronchitis after colds.).   [name] folic acid (FOLVITE) 1 MG tablet take 1 tablet by mouth once daily  . furosemide (LASIX) 20 MG tablet Take 0.5 tablets (10 mg total) by mouth daily. (Patient taking  differently: Take 20 mg by mouth daily as needed (for weight gain of 2 lbs or more). )  . hydrocortisone 2.5 % cream apply to VULVA twice a day AT 10AM AND 4 PM (Patient taking differently: apply to VULVA at bedtime as needed for with miconazole for lichen planus)  . ipratropium (ATROVENT HFA) 17 MCG/ACT inhaler Inhale 1 puff into the lungs every 4 (four) hours as needed (for asthmatic bronchitis after colds.).   [name] ketoconazole (NIZORAL) 2 % cream Apply 1 application topically daily. (Patient taking differently: Apply 1 application topically daily as needed (for crack bleeding fingers.). )  . loratadine (CLARITIN) 10 MG tablet Take 10 mg by mouth daily.  . meclizine (ANTIVERT) 25 MG tablet Take 25 mg by mouth 3 (three) times daily as needed for dizziness.  . miconazole (MICOTIN) 2 % cream Apply 1 application topically at bedtime as needed (with hydrocortisone for lichen planus).   . Multiple Vitamins-Minerals (HAIR/SKIN/NAILS PO) Take 1 tablet by mouth daily.  . nitroGLYCERIN (NITROSTAT) 0.4 MG SL tablet Place 1 tablet (0.4 mg total) under the tongue every 5 (five) minutes as needed for chest pain. Place 1 tablet (0.4 mg total) under the tongue every 5 (five) minutes , 3 times as needed for chest pain.  . Omega-3 Fatty Acids (FISH OIL PO) Take 2,000 mg by mouth daily.  . pantoprazole (PROTONIX) 40 MG tablet take 1 tablet by mouth twice a day  . polyethylene glycol powder (GLYCOLAX/MIRALAX) powder Take 8.5 g by mouth once.  . ramipril (ALTACE) 10 MG capsule take 1 capsule by mouth once daily  . spironolactone (ALDACTONE) 25 MG tablet take 1 tablet by mouth every morning  . traMADol  (ULTRAM) 50 MG tablet Take 1 tablet (50 mg total) by mouth daily as needed.  . traZODone (DESYREL) 150 MG tablet take 1 and 2/3-2 tablets by mouth at bedtime as directed (Patient taking differently: Take 1.5 tablets (225 mg) by mouth at bedtime.)  . verapamil (CALAN-SR) 180 MG CR tablet Take 1 tablet (180 mg total) by mouth at bedtime. (Patient taking differently: Take 180 mg by mouth 2 (two) times daily. )  . vitamin B-12 (CYANOCOBALAMIN) 1000 MCG tablet Take 1,000 mcg by mouth daily.  . vitamin C (ASCORBIC ACID) 500 MG tablet Take 500 mg by mouth at bedtime.  . [DISCONTINUED] bisoprolol (ZEBETA) 5 MG tablet Take 2.5 mg by mouth at bedtime.   No facility-administered encounter medications on file as of 03/15/2017.     Review of Systems  Constitutional: Negative for appetite change and unexpected weight change.  HENT: Negative for congestion and sinus pressure.   Respiratory: Negative for cough and chest tightness.        Breathing better.   Cardiovascular: Negative for chest pain, palpitations and leg swelling.  Gastrointestinal: Negative for abdominal pain, diarrhea, nausea and vomiting.  Genitourinary: Negative for difficulty urinating and dysuria.  Musculoskeletal:       Chronic back pain and leg pain.    Skin: Negative for color change and rash.  Neurological: Negative for dizziness, light-headedness and headaches.  Psychiatric/Behavioral: Negative for agitation and dysphoric mood.       Memory improved.         Objective:     Blood pressure rechecked by me:  128/68  Physical Exam  Constitutional: She appears well-developed and well-nourished. No distress.  HENT:  Nose: Nose normal.  Mouth/Throat: Oropharynx is clear and moist.  Neck: Neck supple. No thyromegaly present.  Cardiovascular: Normal rate and  regular rhythm.   Pulmonary/Chest: Breath sounds normal. No respiratory distress. She has no wheezes.  Abdominal: Soft. Bowel sounds are normal. There is no tenderness.    Musculoskeletal: She exhibits no edema or tenderness.  Lymphadenopathy:    She has no cervical adenopathy.  Skin: No rash noted. No erythema.  Psychiatric: She has a normal mood and affect. Her behavior is normal.    BP 124/80 (BP Location: Left Arm, Patient Position: Sitting, Cuff Size: Normal)   Pulse 62   Temp 97.5 F (36.4 C) (Oral)   Resp 12   Wt 185 lb (83.9 kg)   SpO2 96%   BMI 32.77 kg/m  Wt Readings from Last 3 Encounters:  03/15/17 185 lb (83.9 kg)  03/12/17 183 lb (83 kg)  03/11/17 183 lb 6.4 oz (83.2 kg)     Lab Results  Component Value Date   WBC 5.0 03/13/2017   HGB 13.4 03/13/2017   HCT 39.1 03/13/2017   PLT 200 03/13/2017   GLUCOSE 116 (H) 03/13/2017   CHOL 195 01/07/2017   TRIG 73.0 01/07/2017   HDL 57.20 01/07/2017   LDLDIRECT 126.7 07/03/2013   LDLCALC 124 (H) 01/07/2017   ALT 22 03/12/2017   AST 41 03/12/2017   NA 136 03/13/2017   K 4.0 03/13/2017   CL 104 03/13/2017   CREATININE 0.86 03/13/2017   BUN 20 03/13/2017   CO2 25 03/13/2017   TSH 1.473 03/12/2017   INR 1.13 03/12/2017   HGBA1C 5.6 03/12/2017    Mr Brain Wo Contrast  Result Date: 03/13/2017 CLINICAL DATA:  Altered mental status yesterday. Repeating phrases. Chronic atrial fibrillation. EXAM: MRI HEAD WITHOUT CONTRAST TECHNIQUE: Multiplanar, multiecho pulse sequences of the brain and surrounding structures were obtained without intravenous contrast. COMPARISON:  Head CT 03/12/2017 FINDINGS: Brain: Diffusion imaging does not show any acute or subacute infarction. The brainstem and cerebellum are normal. Cerebral hemispheres show mild age related volume loss with minimal small vessel change in the white matter, frontal lobe predominant. No cortical or large vessel territory infarction. No mass lesion, hemorrhage, hydrocephalus or extra-axial collection. No pituitary mass. Vascular: Major vessels at the base of the brain show flow. Skull and upper cervical spine: Negative Sinuses/Orbits:  Clear/normal Other: None significant IMPRESSION: Minimal brain volume loss, typical of age. Minimal small vessel change of the hemispheric white matter, less than often seen in healthy individuals of this age. No acute or reversible finding. Electronically Signed   By: Nelson Chimes M.D.   On: 03/13/2017 11:34   US Carotid Bilateral  Result Date: 03/12/2017 CLINICAL DATA:  TIA, altered mental status. EXAM: BILATERAL CAROTID DUPLEX ULTRASOUND TECHNIQUE: Pearline Cables scale imaging, color Doppler and duplex ultrasound were performed of bilateral carotid and vertebral arteries in the neck. COMPARISON:  Head CT 03/12/2017 FINDINGS: Criteria: Quantification of carotid stenosis is based on velocity parameters that correlate the residual internal carotid diameter with NASCET-based stenosis levels, using the diameter of the distal internal carotid lumen as the denominator for stenosis measurement. The following velocity measurements were obtained: RIGHT ICA:  109 cm/sec CCA:  83 cm/sec SYSTOLIC ICA/CCA RATIO:  1.3 DIASTOLIC ICA/CCA RATIO:  3.0 ECA:  135 cm/sec LEFT ICA:  100 cm/sec CCA:  400 cm/sec SYSTOLIC ICA/CCA RATIO:  1.0 DIASTOLIC ICA/CCA RATIO:  2.0 ECA:  184 cm/sec RIGHT CAROTID ARTERY: Common carotid artery demonstrates mild intimal thickening with minimal focal plaque over the mid segment. Minimal calcified plaque at the right carotid bulb. No significant plaque over the internal carotid artery.  Doppler waveforms within normal. RIGHT VERTEBRAL ARTERY:  Normal antegrade flow. LEFT CAROTID ARTERY: Minimal intimal thickening of the common carotid artery. Mild calcified plaque over the distal segment of the common carotid artery. Somewhat heterogeneous calcified plaque at the carotid bulb and proximal internal carotid artery. Normal Doppler waveforms. LEFT VERTEBRAL ARTERY:  Normal antegrade flow. IMPRESSION: Moderate atherosclerotic plaque over the left carotid bulb and origin of the left internal carotid artery. Very  minimal calcified plaque over the right carotid bulb. No hemodynamically significant stenosis of the internal carotid arteries. Normal antegrade flow within the vertebral arteries. Electronically Signed   By: Marin Olp M.D.   On: 03/12/2017 21:10   Ct Head Code Stroke Wo Contrast`  Addendum Date: 03/12/2017   ADDENDUM REPORT: 03/12/2017 19:06 ADDENDUM: These results were called by telephone at the time of interpretation on 03/12/2017 at 6:40 Pm to Dr. Rudene Re , who verbally acknowledged these results. Electronically Signed   By: Ulyses Jarred M.D.   On: 03/12/2017 19:06   Result Date: 03/12/2017 CLINICAL DATA:  Code stroke.  4-5 days memory loss EXAM: CT HEAD WITHOUT CONTRAST TECHNIQUE: Contiguous axial images were obtained from the base of the skull through the vertex without intravenous contrast. COMPARISON:  None. FINDINGS: Brain: No mass lesion, intraparenchymal hemorrhage or extra-axial collection. No evidence of acute cortical infarct. Brain parenchyma and CSF-containing spaces are normal for age. Vascular: No hyperdense vessel or unexpected calcification. Skull: Normal visualized skull base, calvarium and extracranial soft tissues. Sinuses/Orbits: No sinus fluid levels or advanced mucosal thickening. No mastoid effusion. Normal orbits. ASPECTS [facility] Stroke Program Early CT Score) - Ganglionic level infarction (caudate, lentiform nuclei, internal capsule, insula, M1-M3 cortex): 7 - Supraganglionic infarction (M4-M6 cortex): 3 Total score (0-10 with 10 being normal): 10 IMPRESSION: 1. Normal CT of the brain for age. 2. ASPECTS is 10. Electronically Signed: By: Ulyses Jarred M.D. On: 03/12/2017 18:35       Assessment & Plan:   Problem List Items Addressed This Visit    CAD (coronary artery disease)    S/p DES to SVG to RCA 05/2016 as outlined.  Seeing cardiology.  On eliquis and plavix.  Saw Dr Caryl Comes.  S/p cardioversion.        Chronic diastolic heart failure (HCC)    No evidence of  volume overload on exam.  Follow.        Depression    Stable.  Follow.        Essential hypertension, benign    Blood pressure under good control.  Continue same medication regimen.  Follow pressures.  Follow metabolic panel.        Hypercholesterolemia    On zetia.  Unable to take statin medication.  Follow lipid panel.        Persistent atrial fibrillation ([place])    S/p cardioversion.  On eliquis and plavix.  Saw Dr Caryl Comes.        Spinal stenosis    Chronic back pain.  Has spinal stenosis.  Limits her activity.        TGA (transient global amnesia)    Recently admitted with acute confusion.  W/up as outlined.  Diagnosed with transient global amnesia).  Saw neurology.  Memory improved.  Follow.            [name], MD

## 2017-03-15 NOTE — Telephone Encounter (Signed)
Transition Care Management Follow-up Telephone Call  How have you been since you were released from the hospital? Feels ok but foggier, episode before hospital visit had complete loss of short term memory.  Do you understand why you were in the hospital? Yes, transient global amnesia.  Do you understand the discharge instrcutions? Yes  Items Reviewed:  Medications reviewed: Yes, saw Dr.Klien in May and found an enlarged Atrium bilateral, Patient had cardioversion last Friday and then on Sunday she stated she had a left sided headache which was unusual for her.  Allergies reviewed: Yes  Dietary changes reviewed: Yes  Referrals reviewed: Yes   Functional Questionnaire:  Activities of Daily Living (ADLs):   She states they are independent in the following: Independent Adls. States they require assistance with the following: No assist needed.   Any transportation issues/concerns?: No   Any patient concerns? Yes, results of MRI.   Confirmed importance and date/time of follow-up visits scheduled: Yes   Confirmed with patient if condition begins to worsen call PCP or go to the ER.  Patient was given the Call-a-Nurse line [phone]: YES

## 2017-03-17 ENCOUNTER — Encounter: Payer: Self-pay | Admitting: Internal Medicine

## 2017-03-17 NOTE — Assessment & Plan Note (Signed)
On zetia.  Unable to take statin medication.  Follow lipid panel.

## 2017-03-17 NOTE — Assessment & Plan Note (Signed)
S/p DES to SVG to RCA 05/2016 as outlined.  Seeing cardiology.  On eliquis and plavix.  Saw Dr Caryl Comes.  S/p cardioversion.

## 2017-03-17 NOTE — Assessment & Plan Note (Signed)
S/p cardioversion.  On eliquis and plavix.  Saw Dr Caryl Comes.

## 2017-03-17 NOTE — Assessment & Plan Note (Signed)
Stable.  Follow.   

## 2017-03-17 NOTE — Assessment & Plan Note (Signed)
Blood pressure under good control.  Continue same medication regimen.  Follow pressures.  Follow metabolic panel.   

## 2017-03-17 NOTE — Assessment & Plan Note (Signed)
No evidence of volume overload on exam.  Follow.   

## 2017-03-17 NOTE — Assessment & Plan Note (Signed)
Recently admitted with acute confusion.  W/up as outlined.  Diagnosed with transient global amnesia).  Saw neurology.  Memory improved.  Follow.

## 2017-03-17 NOTE — Assessment & Plan Note (Signed)
Chronic back pain.  Has spinal stenosis.  Limits her activity.

## 2017-03-18 ENCOUNTER — Other Ambulatory Visit: Payer: Self-pay | Admitting: Physical Medicine and Rehabilitation

## 2017-03-18 DIAGNOSIS — M5416 Radiculopathy, lumbar region: Secondary | ICD-10-CM | POA: Diagnosis not present

## 2017-03-18 DIAGNOSIS — M5136 Other intervertebral disc degeneration, lumbar region: Secondary | ICD-10-CM | POA: Diagnosis not present

## 2017-03-18 DIAGNOSIS — M48062 Spinal stenosis, lumbar region with neurogenic claudication: Secondary | ICD-10-CM | POA: Diagnosis not present

## 2017-03-22 ENCOUNTER — Telehealth: Payer: Self-pay | Admitting: *Deleted

## 2017-03-22 DIAGNOSIS — R404 Transient alteration of awareness: Secondary | ICD-10-CM

## 2017-03-22 DIAGNOSIS — G454 Transient global amnesia: Secondary | ICD-10-CM

## 2017-03-22 NOTE — Telephone Encounter (Signed)
Patient was questioning if she would still have a referral to have a EEG. Pt requested a call after 1pm Pt contact [phone]

## 2017-03-22 NOTE — Telephone Encounter (Signed)
Spoke with her, she was to originally have it done at the hospital but the staff was not coming in until after her discharge time, so they told her to have her Primary care doctor get to arranged as a outpatient. thanks

## 2017-03-22 NOTE — Telephone Encounter (Signed)
Please confirm with pt that this was recommended while she was in hospital and they wanted her to have as an outpt.  If so, will get this scheduled.

## 2017-03-22 NOTE — Telephone Encounter (Signed)
Please advise, I don't see in your last OV a mention of a EEG, thanks

## 2017-03-24 NOTE — Telephone Encounter (Signed)
Order placed for neurology referral.  See note on comments section of referral for order.  Let me know if I need to do anything else.

## 2017-03-25 ENCOUNTER — Ambulatory Visit
Admission: RE | Admit: 2017-03-25 | Discharge: 2017-03-25 | Disposition: A | Discharge status: Home / Self Care | Payer: Medicare Other | Source: Ambulatory Visit | Attending: Physical Medicine and Rehabilitation | Admitting: Physical Medicine and Rehabilitation

## 2017-03-25 DIAGNOSIS — M5416 Radiculopathy, lumbar region: Secondary | ICD-10-CM | POA: Diagnosis present

## 2017-03-25 DIAGNOSIS — M5116 Intervertebral disc disorders with radiculopathy, lumbar region: Secondary | ICD-10-CM | POA: Diagnosis not present

## 2017-03-25 DIAGNOSIS — M48061 Spinal stenosis, lumbar region without neurogenic claudication: Secondary | ICD-10-CM | POA: Diagnosis not present

## 2017-03-25 DIAGNOSIS — M4807 Spinal stenosis, lumbosacral region: Secondary | ICD-10-CM | POA: Insufficient documentation

## 2017-03-28 ENCOUNTER — Ambulatory Visit
Admission: RE | Admit: 2017-03-28 | Discharge: 2017-03-28 | Disposition: A | Discharge status: Home / Self Care | Payer: Medicare Other | Source: Ambulatory Visit | Attending: Internal Medicine | Admitting: Internal Medicine

## 2017-03-28 DIAGNOSIS — Z1231 Encounter for screening mammogram for malignant neoplasm of breast: Secondary | ICD-10-CM | POA: Diagnosis not present

## 2017-03-28 DIAGNOSIS — Z1239 Encounter for other screening for malignant neoplasm of breast: Secondary | ICD-10-CM

## 2017-03-29 ENCOUNTER — Other Ambulatory Visit: Payer: Self-pay | Admitting: Internal Medicine

## 2017-04-11 ENCOUNTER — Encounter: Payer: Self-pay | Admitting: Internal Medicine

## 2017-04-11 ENCOUNTER — Ambulatory Visit (INDEPENDENT_AMBULATORY_CARE_PROVIDER_SITE_OTHER): Payer: Medicare Other | Admitting: Internal Medicine

## 2017-04-11 VITALS — BP 118/64 | HR 84 | Ht 63.0 in | Wt 186.8 lb

## 2017-04-11 DIAGNOSIS — I481 Persistent atrial fibrillation: Secondary | ICD-10-CM

## 2017-04-11 DIAGNOSIS — I2510 Atherosclerotic heart disease of native coronary artery without angina pectoris: Secondary | ICD-10-CM

## 2017-04-11 DIAGNOSIS — R001 Bradycardia, unspecified: Secondary | ICD-10-CM | POA: Diagnosis not present

## 2017-04-11 DIAGNOSIS — I4819 Other persistent atrial fibrillation: Secondary | ICD-10-CM

## 2017-04-11 NOTE — Progress Notes (Signed)
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 This was associated with significant exercise intolerance.  She has a history of coronary artery disease with prior bypass surgery;  she also had stent to her being graft 8/17 having presented with chest pain Echo EF 1/18 was 60-65% with moderate to severe left atrial enlargement  Following cardioversion she held sinus for a few days and then reverted to atrial fibrillation. She noted no change in her symptoms. The biggest change she had noted was a discontinuation of her beta blocker and the institution of verapamil in its place. She has not had problems with constipation.  She has had problems with persistent elevated LDL  she is not a candidate for statins. She is on Zetia.  She has had problems recently with bleeding from hemorrhoids. She is on anticoagulation and antiplatelet therapy  Records and Results Reviewed   Past Medical History:  Diagnosis Date  . Allergy   . Arthritis    s/p bilateral knees and left hip replacement  . Asthma   . CAD (coronary artery disease)    a. 12/1996 s/p CABG x4 ([place]);  b. 2005 Pt reports stress test & cath, which revealed patent grafts.  . Cancer ([place])    melanoma right arm  . Carotid arterial disease ([place])    a. 04/2015 Carotid U/S: <50% bilat ICA stenosis.  . Chronic kidney disease   . Colon polyps    H/O  . Depression   . GERD (gastroesophageal reflux disease)    h/o hiatal hernia  . Headache    migraines in past  . Heart murmur    a. 04/2011 Echo: EF 55-60%, bilat atrial enlargement, mild to mod TR.  [name] History of chicken pox   . History of hiatal hernia   . Hx of migraines    rare now  . Hx: UTI (urinary tract infection)   . Hyperlipidemia    a. Statin intolerant -->on  zetia.  . Hypertensive heart disease   . Lichen planus   . Melanoma ([place])   . Palpitations    a. rare PVC's and h/o SVT.  [name] PMR (polymyalgia rheumatica) (HCC)    h/o in setting of crestor usage.  [name] PSVT (paroxysmal supraventricular tachycardia) ([place])   . PUD (peptic ulcer disease)    remote history  . Raynaud's phenomenon   . Spinal stenosis   . Urine incontinence    H/O  . Vitamin D deficiency     Past Surgical History:  Procedure Laterality Date  . ADENOIDECTOMY     age 81  . BACK SURGERY     L3-L5  . BILATERAL CARPAL TUNNEL RELEASE    . BREAST BIOPSY Right    bx x 3 neg  . BREAST SURGERY Right    biopsy x 3 (all benign)  . CARDIAC CATHETERIZATION N/A 06/01/2016   Procedure: LEFT HEART CATH AND CORS/GRAFTS ANGIOGRAPHY;  Surgeon: Wellington Hampshire, MD;  Location: [facility];  Service: Cardiovascular;  Laterality: N/A;  . CARDIAC CATHETERIZATION N/A 06/01/2016   Procedure: Coronary Stent Intervention;  Surgeon: Wellington Hampshire, MD;  Location: [place] [facility];  Service: Cardiovascular;  Laterality: N/A;  . CARDIOVERSION N/A 03/08/2017   Procedure: CARDIOVERSION;  Surgeon: Wellington Hampshire, MD;  Location:  [facility];  Service: Cardiovascular;  Laterality: N/A;  . CATARACT EXTRACTION W/PHACO Right 01/04/2016   Procedure: CATARACT EXTRACTION PHACO AND INTRAOCULAR LENS PLACEMENT (IOC);  Surgeon: Leandrew Koyanagi, MD;  Location: [place];  Service: Ophthalmology;  Laterality: Right;  MALYUGIN  . CATARACT EXTRACTION W/PHACO Left 01/25/2016   Procedure: CATARACT EXTRACTION PHACO AND INTRAOCULAR LENS PLACEMENT ([place]) left eye;  Surgeon: Leandrew Koyanagi, MD;  Location: [place];  Service: Ophthalmology;  Laterality: Left;  MALYUGIN SHUGARCAINE  . CHOLECYSTECTOMY  90's  . COLONOSCOPY WITH PROPOFOL N/A 05/03/2015   Procedure: COLONOSCOPY WITH PROPOFOL;  Surgeon: Lollie Sails, MD;  Location: [facility];  Service: Endoscopy;  Laterality: N/A;  .  CORONARY ARTERY BYPASS GRAFT  98   4 vessel  . ESOPHAGOGASTRODUODENOSCOPY N/A 03/22/2015   Procedure: ESOPHAGOGASTRODUODENOSCOPY (EGD);  Surgeon: Lollie Sails, MD;  Location: [facility] ENDOSCOPY;  Service: Endoscopy;  Laterality: N/A;  . JOINT REPLACEMENT     BILATERAL KNEE REPLACEMENTS  . KNEE ARTHROSCOPY W/ OATS PROCEDURE     Lt knee (9/01), Rt knee (3/11), Lt hip (5/10)  . TOTAL HIP ARTHROPLASTY      Current Outpatient Prescriptions  Medication Sig Dispense Refill  . acetaminophen (TYLENOL) 500 MG tablet Take 1,000 mg by mouth 2 (two) times daily.     [name] amitriptyline (ELAVIL) 25 MG tablet Take 1-2 tablets (25-50 mg total) by mouth at bedtime. (Patient taking differently: Take 25 mg by mouth at bedtime. ) 60 tablet 5  . b complex vitamins tablet Take 1 tablet by mouth daily.    . Cholecalciferol (VITAMIN D3) 2000 UNITS TABS Take 2,000 Units by mouth daily.     . clopidogrel (PLAVIX) 75 MG tablet take 1 tablet by mouth once daily 30 tablet 6  . Coenzyme Q10 (COQ-10) 100 MG CAPS Take 100 mg by mouth daily.     [name] ELIQUIS 5 MG TABS tablet take 1 tablet by mouth twice a day 60 tablet 6  . ezetimibe (ZETIA) 10 MG tablet take 1 tablet by mouth once daily 30 tablet 7  . Fluticasone-Salmeterol (ADVAIR) 250-50 MCG/DOSE AEPB Inhale 1 puff into the lungs 2 (two) times daily as needed (for asthmatic bronchitis after colds.).     [name] folic acid (FOLVITE) 1 MG tablet take 1 tablet by mouth once daily 30 tablet 5  . furosemide (LASIX) 20 MG tablet Take 0.5 tablets (10 mg total) by mouth daily. (Patient taking differently: Take 20 mg by mouth daily as needed (for weight gain of 2 lbs or more). ) 15 tablet 3  . hydrocortisone 2.5 % cream apply to VULVA twice a day AT 10AM AND 4 PM (Patient taking differently: apply to VULVA at bedtime as needed for with miconazole for lichen planus) 28 g 0  . ipratropium (ATROVENT HFA) 17 MCG/ACT inhaler Inhale 1 puff into the lungs every 4 (four) hours as needed (for  asthmatic bronchitis after colds.).     [name] ketoconazole (NIZORAL) 2 % cream Apply 1 application topically daily. (Patient taking differently: Apply 1 application topically daily as needed (for crack bleeding fingers.). ) 30 g 0  . loratadine (CLARITIN) 10 MG tablet Take 10 mg by mouth daily.    . meclizine (ANTIVERT) 25 MG tablet Take 25 mg by mouth 3 (three) times daily as needed for dizziness.    . miconazole (MICOTIN) 2 % cream Apply 1 application topically at bedtime as needed (with hydrocortisone for lichen planus).     . Multiple Vitamins-Minerals (HAIR/SKIN/NAILS PO)  Take 1 tablet by mouth daily.    . nitroGLYCERIN (NITROSTAT) 0.4 MG SL tablet Place 1 tablet (0.4 mg total) under the tongue every 5 (five) minutes as needed for chest pain. Place 1 tablet (0.4 mg total) under the tongue every 5 (five) minutes , 3 times as needed for chest pain. 15 tablet 0  . Omega-3 Fatty Acids (FISH OIL PO) Take 2,000 mg by mouth daily.    . pantoprazole (PROTONIX) 40 MG tablet take 1 tablet by mouth twice a day 60 tablet 5  . polyethylene glycol powder (GLYCOLAX/MIRALAX) powder Take 8.5 g by mouth once.    . ramipril (ALTACE) 10 MG capsule take 1 capsule by mouth once daily 30 capsule 3  . spironolactone (ALDACTONE) 25 MG tablet take 1 tablet by mouth every morning 30 tablet 5  . traMADol (ULTRAM) 50 MG tablet Take 1 tablet (50 mg total) by mouth daily as needed. 20 tablet 0  . traZODone (DESYREL) 150 MG tablet TAKE 1 AND 2/3 TABLETS TO 2 TABLETS BY MOUTH AT BEDTIME AS DIRECTED 60 tablet 2  . verapamil (CALAN-SR) 180 MG CR tablet Take 1 tablet (180 mg total) by mouth at bedtime. (Patient taking differently: Take 180 mg by mouth 2 (two) times daily. )    . vitamin B-12 (CYANOCOBALAMIN) 1000 MCG tablet Take 1,000 mcg by mouth daily.    . vitamin C (ASCORBIC ACID) 500 MG tablet Take 500 mg by mouth at bedtime.     No current facility-administered medications for this visit.     Allergies  Allergen Reactions    . Beta Adrenergic Blockers Shortness Of Breath    DEPRESSION/HYPOTENSION  . Brilinta [Ticagrelor] Shortness Of Breath    Caused AFIB  . Albuterol     rigors  . Aspirin Other (See Comments)    Heartburn   . Nsaids Other (See Comments)    ULCER HISTORY & CURRENTLY ON BLOOD THINNERS  . Penicillins Hives and Other (See Comments)    Has patient had a PCN reaction causing immediate rash, facial/tongue/throat swelling, SOB or lightheadedness with hypotension: No Has patient had a PCN reaction causing severe rash involving mucus membranes or skin necrosis: No Has patient had a PCN reaction that required hospitalization No Has patient had a PCN reaction occurring within the last 10 years: No If all of the above answers are "NO", then may proceed with Cephalosporin use.   . Statins Other (See Comments)    Muscle weakness  . Sulfa Antibiotics Rash    At mouth  . Tetracycline Rash  . Tetracyclines & Related Rash      Review of Systems negative except from HPI and PMH  Physical Exam Ht 5\' 3"  (1.6 m)   Wt 186 lb 12 oz (84.7 kg)   BMI 33.08 kg/m  Well developed and nourished in no acute distress HENT normal Neck supple with JVP-flat Carotids brisk and full without bruits Clear Irregularly irregular rate and rhythm with controlled ventricular response, no murmurs or gallops Abd-soft with active BS without hepatomegaly No Clubbing cyanosis edema Skin-warm and dry A & Oriented  Grossly normal sensory and motor function  ECG demonstrates atrial fibrillation at 84 Intervals-/09/37  Assessment and  Plan   Atrial fibrillation-permanent  Ischemic heart diseas-status post CABG   status post PCI 8/17   On Anticoagulation;  not withstanding hemorrhoids hemoglobin was 13.4 and early June.  The plan is for her to come off of her antiplatelet therapy at the end of August. We  will arrange for her to follow up with Dr. Deidre Ala function that in the hospital.  She has had no functional  improvement with sinus compared atrial fibrillation. Hence, we will choose to leave her atrial fibrillation permanently. She is much better off of beta blockers and verapamil.  We will see her as needed    Current medicines are reviewed at length with the patient today .  The patient does not have concerns regarding medicines.

## 2017-04-11 NOTE — Patient Instructions (Signed)
Medication Instructions: - Your physician recommends that you continue on your current medications as directed. Please refer to the Current Medication list given to you today.  Labwork: - none ordered  Procedures/Testing: - none ordered  Follow-Up: - Your physician recommends that you schedule a follow-up appointment in: 2 months with Dr. Rockey Situ.    Any Additional Special Instructions Will Be Listed Below (If Applicable).     If you need a refill on your cardiac medications before your next appointment, please call your pharmacy.

## 2017-04-19 ENCOUNTER — Other Ambulatory Visit: Payer: Self-pay | Admitting: Internal Medicine

## 2017-04-23 ENCOUNTER — Other Ambulatory Visit: Payer: Self-pay | Admitting: Family Medicine

## 2017-04-23 DIAGNOSIS — R413 Other amnesia: Secondary | ICD-10-CM | POA: Diagnosis not present

## 2017-04-24 NOTE — Telephone Encounter (Signed)
Dr.Scott patient 

## 2017-04-30 ENCOUNTER — Ambulatory Visit (INDEPENDENT_AMBULATORY_CARE_PROVIDER_SITE_OTHER): Payer: Medicare Other | Admitting: Internal Medicine

## 2017-04-30 ENCOUNTER — Encounter: Payer: Self-pay | Admitting: Internal Medicine

## 2017-04-30 DIAGNOSIS — F329 Major depressive disorder, single episode, unspecified: Secondary | ICD-10-CM

## 2017-04-30 DIAGNOSIS — E559 Vitamin D deficiency, unspecified: Secondary | ICD-10-CM

## 2017-04-30 DIAGNOSIS — M4800 Spinal stenosis, site unspecified: Secondary | ICD-10-CM | POA: Diagnosis not present

## 2017-04-30 DIAGNOSIS — F32A Depression, unspecified: Secondary | ICD-10-CM

## 2017-04-30 DIAGNOSIS — I4819 Other persistent atrial fibrillation: Secondary | ICD-10-CM

## 2017-04-30 DIAGNOSIS — I481 Persistent atrial fibrillation: Secondary | ICD-10-CM | POA: Diagnosis not present

## 2017-04-30 DIAGNOSIS — K2100 Gastro-esophageal reflux disease with esophagitis, without bleeding: Secondary | ICD-10-CM

## 2017-04-30 DIAGNOSIS — E7800 Pure hypercholesterolemia, unspecified: Secondary | ICD-10-CM | POA: Diagnosis not present

## 2017-04-30 DIAGNOSIS — I5032 Chronic diastolic (congestive) heart failure: Secondary | ICD-10-CM

## 2017-04-30 DIAGNOSIS — I2510 Atherosclerotic heart disease of native coronary artery without angina pectoris: Secondary | ICD-10-CM

## 2017-04-30 DIAGNOSIS — I10 Essential (primary) hypertension: Secondary | ICD-10-CM

## 2017-04-30 DIAGNOSIS — G454 Transient global amnesia: Secondary | ICD-10-CM | POA: Diagnosis not present

## 2017-04-30 NOTE — Progress Notes (Signed)
Pre-visit discussion using our clinic review tool. No additional management support is needed unless otherwise documented below in the visit note.  

## 2017-04-30 NOTE — Progress Notes (Signed)
Patient ID: Charlotte Wagner, Dr., female   DOB: 06/25/1936, 81 y.o.   MRN: [id number]   Subjective:    Patient ID: Charlotte Wagner, Dr., female    DOB: April 25, 1936, 81 y.o.   MRN: [id number]  HPI  Patient here for a scheduled follow up.  She was admitted 03/12/17 with acute confusion.  Diagnosed with transient global amnesia.  W/up as outlined in previous note.  Neurology evaluated while in the hospital.  Recommended outpatient EEG.  EEG revealed left temporal theta slowing.  She has had not other "episodes".  Discussed f/u with neurology.  She is in agreement.  Breathing overall stable.  No chest pain.  Due to f/u with Dr Rockey Situ 06/2017.  No acid reflux.  No abdominal pain.  Bowels moving.  Walks with a walker.     Past Medical History:  Diagnosis Date  . Allergy   . Arthritis    s/p bilateral knees and left hip replacement  . Asthma   . CAD (coronary artery disease)    a. 12/1996 s/p CABG x4 ([place]);  b. 2005 Pt reports stress test & cath, which revealed patent grafts.  . Cancer ([place])    melanoma right arm  . Carotid arterial disease ([place])    a. 04/2015 Carotid U/S: <50% bilat ICA stenosis.  . Chronic kidney disease   . Colon polyps    H/O  . Depression   . GERD (gastroesophageal reflux disease)    h/o hiatal hernia  . Headache    migraines in past  . Heart murmur    a. 04/2011 Echo: EF 55-60%, bilat atrial enlargement, mild to mod TR.  [name] History of chicken pox   . History of hiatal hernia   . Hx of migraines    rare now  . Hx: UTI (urinary tract infection)   . Hyperlipidemia    a. Statin intolerant -->on zetia.  . Hypertensive heart disease   . Lichen planus   . Melanoma ([place])   . Palpitations    a. rare PVC's and h/o SVT.  [name] PMR (polymyalgia rheumatica) (HCC)    h/o in setting of crestor usage.  [name] PSVT (paroxysmal supraventricular tachycardia) ([place])   . PUD (peptic ulcer disease)    remote history  . Raynaud's phenomenon   . Spinal stenosis   . Urine incontinence    H/O  . Vitamin D deficiency    Past Surgical History:  Procedure Laterality Date  . ADENOIDECTOMY     age 25  . BACK SURGERY     L3-L5  . BILATERAL CARPAL TUNNEL RELEASE    . BREAST BIOPSY Right    bx x 3 neg  . BREAST SURGERY Right    biopsy x 3 (all benign)  . CARDIAC CATHETERIZATION N/A 06/01/2016   Procedure: LEFT HEART CATH AND CORS/GRAFTS ANGIOGRAPHY;  Surgeon: Wellington Hampshire, MD;  Location: [facility];  Service: Cardiovascular;  Laterality: N/A;  . CARDIAC CATHETERIZATION N/A 06/01/2016   Procedure: Coronary Stent Intervention;  Surgeon: Wellington Hampshire, MD;  Location: [place] [facility];  Service: Cardiovascular;  Laterality: N/A;  . CARDIOVERSION N/A 03/08/2017   Procedure: CARDIOVERSION;  Surgeon: Wellington Hampshire, MD;  Location: [facility] ORS;  Service: Cardiovascular;  Laterality: N/A;  . CATARACT EXTRACTION W/PHACO Right 01/04/2016   Procedure: CATARACT EXTRACTION PHACO AND INTRAOCULAR LENS PLACEMENT ([place]);  Surgeon: Leandrew Koyanagi, MD;  Location: [place];  Service: Ophthalmology;  Laterality: Right;  MALYUGIN  . CATARACT  EXTRACTION W/PHACO Left 01/25/2016   Procedure: CATARACT EXTRACTION PHACO AND INTRAOCULAR LENS PLACEMENT (IOC) left eye;  Surgeon: Leandrew Koyanagi, MD;  Location: [place];  Service: Ophthalmology;  Laterality: Left;  MALYUGIN SHUGARCAINE  . CHOLECYSTECTOMY  90's  . COLONOSCOPY WITH PROPOFOL N/A 05/03/2015   Procedure: COLONOSCOPY WITH PROPOFOL;  Surgeon: Lollie Sails, MD;  Location: [facility] ENDOSCOPY;  Service: Endoscopy;  Laterality: N/A;  . CORONARY ARTERY BYPASS GRAFT  98   4 vessel  . ESOPHAGOGASTRODUODENOSCOPY N/A 03/22/2015   Procedure: ESOPHAGOGASTRODUODENOSCOPY (EGD);  Surgeon: Lollie Sails, MD;  Location: [facility] ENDOSCOPY;  Service: Endoscopy;  Laterality: N/A;  . JOINT REPLACEMENT     BILATERAL KNEE REPLACEMENTS  . KNEE ARTHROSCOPY W/ OATS PROCEDURE     Lt knee (9/01), Rt knee (3/11), Lt hip (5/10)  .  TOTAL HIP ARTHROPLASTY     Family History  Problem Relation Age of Onset  . Thyroid disease Mother        graves disease  . Heart disease Father        rheumatic heart  . Alcohol abuse Father   . Depression Father   . Lung cancer Sister   . Depression Daughter   . Thyroid disease Daughter        hashimoto  . Depression Son   . Stroke Maternal Grandmother   . Depression Maternal Grandmother   . Hypertension Maternal Grandfather   . Hypertension Paternal Grandfather   . Seizures Son   . Breast cancer Neg Hx    Social History   Social History  . Marital status: Married    Spouse name: N/A  . Number of children: 3  . Years of education: N/A   Occupational History  . Retired Sport and exercise psychologist    Social History Main Topics  . Smoking status: Former Research scientist (life sciences)  . Smokeless tobacco: Never Used     Comment: quit 44+ yrs ago  . Alcohol use No  . Drug use: No  . Sexual activity: No   Other Topics Concern  . None   Social History Narrative   Lives in [place].  Retired Engineer, drilling.  Relatively active.     Review of Systems  Constitutional: Negative for appetite change and unexpected weight change.  HENT: Negative for congestion and sinus pressure.   Respiratory: Negative for cough and chest tightness.        Breathing stable.    Cardiovascular: Negative for chest pain, palpitations and leg swelling.  Gastrointestinal: Negative for abdominal pain, diarrhea, nausea and vomiting.  Genitourinary: Negative for difficulty urinating and dysuria.  Musculoskeletal: Negative for myalgias.  Skin: Negative for color change and rash.  Neurological: Negative for dizziness, light-headedness and headaches.  Psychiatric/Behavioral: Negative for agitation and dysphoric mood.       Objective:     Blood pressure rechecked by me:  126/74 Physical Exam  Constitutional: She appears well-developed and well-nourished. No distress.  HENT:  Nose: Nose normal.  Mouth/Throat: Oropharynx is  clear and moist.  Neck: Neck supple. No thyromegaly present.  Cardiovascular:  Rate controlled.    Pulmonary/Chest: Breath sounds normal. No respiratory distress. She has no wheezes.  Abdominal: Soft. Bowel sounds are normal. There is no tenderness.  Musculoskeletal: She exhibits no edema or tenderness.  Lymphadenopathy:    She has no cervical adenopathy.  Skin: No rash noted. No erythema.  Psychiatric: She has a normal mood and affect. Her behavior is normal.    BP 116/62 (BP Location: Left Arm, Patient Position:  Sitting, Cuff Size: Normal)   Pulse 70   Temp 98.6 F (37 C) (Oral)   Resp 12   Ht 5\' 3"  (1.6 m)   Wt 188 lb 12.8 oz (85.6 kg)   SpO2 96%   BMI 33.44 kg/m  Wt Readings from Last 3 Encounters:  04/30/17 188 lb 12.8 oz (85.6 kg)  04/11/17 186 lb 12 oz (84.7 kg)  03/15/17 185 lb (83.9 kg)     Lab Results  Component Value Date   WBC 5.0 03/13/2017   HGB 13.4 03/13/2017   HCT 39.1 03/13/2017   PLT 200 03/13/2017   GLUCOSE 116 (H) 03/13/2017   CHOL 195 01/07/2017   TRIG 73.0 01/07/2017   HDL 57.20 01/07/2017   LDLDIRECT 126.7 07/03/2013   LDLCALC 124 (H) 01/07/2017   ALT 22 03/12/2017   AST 41 03/12/2017   NA 136 03/13/2017   K 4.0 03/13/2017   CL 104 03/13/2017   CREATININE 0.86 03/13/2017   BUN 20 03/13/2017   CO2 25 03/13/2017   TSH 1.473 03/12/2017   INR 1.13 03/12/2017   HGBA1C 5.6 03/12/2017    Mm Screening Breast Tomo Bilateral  Result Date: 03/28/2017 CLINICAL DATA:  Screening. EXAM: 2D DIGITAL SCREENING BILATERAL MAMMOGRAM WITH CAD AND ADJUNCT TOMO COMPARISON:  Previous exam(s). ACR Breast Density Category b: There are scattered areas of fibroglandular density. FINDINGS: There are no findings suspicious for malignancy. Images were processed with CAD. IMPRESSION: No mammographic evidence of malignancy. A result letter of this screening mammogram will be mailed directly to the patient. RECOMMENDATION: Screening mammogram in one year. (Code:SM-B-01Y)  BI-RADS CATEGORY  1: Negative. Electronically Signed   By: Dorise Bullion III M.D   On: 03/28/2017 16:19       Assessment & Plan:   Problem List Items Addressed This Visit    Chronic diastolic heart failure (HCC)    No evidence of volume overload now.  Follow.        Depression    Stable.  Follow.        Essential hypertension, benign    Blood pressure under good control.  Continue same medication regimen.  Follow pressures.  Follow metabolic panel.        GERD (gastroesophageal reflux disease)    Controlled on protonix.       Hypercholesterolemia    Unable to take statin medication.  On zetia.  Follow lipid panel.       Persistent atrial fibrillation (HCC)    On eliquis and plavix.  Saw  Dr Caryl Comes recently.  Planning to f/u with Dr Rockey Situ 06/2017.  Stable.       Spinal stenosis    Chronic back pain.  Stable.        TGA (transient global amnesia)    Recently admitted and diagnosed with transient global amnesia.  Neurology evaluated in the hospital.  Recommended outpatient EEG.  EEG revealed left temporal lobe slowing.  She has not had any further "episodes".  Discussed f/u with neurology.  She is agreeable.        Vitamin D deficiency    Continue vitamin D supplements.           [name], MD

## 2017-05-02 ENCOUNTER — Other Ambulatory Visit: Payer: Self-pay | Admitting: Internal Medicine

## 2017-05-03 ENCOUNTER — Encounter: Payer: Self-pay | Admitting: Internal Medicine

## 2017-05-03 NOTE — Assessment & Plan Note (Signed)
Stable.  Follow.   

## 2017-05-03 NOTE — Assessment & Plan Note (Signed)
Recently admitted and diagnosed with transient global amnesia.  Neurology evaluated in the hospital.  Recommended outpatient EEG.  EEG revealed left temporal lobe slowing.  She has not had any further "episodes".  Discussed f/u with neurology.  She is agreeable.

## 2017-05-03 NOTE — Assessment & Plan Note (Signed)
Controlled on protonix.   

## 2017-05-03 NOTE — Assessment & Plan Note (Signed)
On eliquis and plavix.  Saw  Dr Caryl Comes recently.  Planning to f/u with Dr Rockey Situ 06/2017.  Stable.

## 2017-05-03 NOTE — Assessment & Plan Note (Signed)
Unable to take statin medication.  On zetia.  Follow lipid panel.   

## 2017-05-03 NOTE — Assessment & Plan Note (Signed)
No evidence of volume overload now.  Follow.

## 2017-05-03 NOTE — Assessment & Plan Note (Signed)
Continue vitamin D supplements.  

## 2017-05-03 NOTE — Assessment & Plan Note (Signed)
Blood pressure under good control.  Continue same medication regimen.  Follow pressures.  Follow metabolic panel.   

## 2017-05-03 NOTE — Assessment & Plan Note (Signed)
Chronic back pain.  Stable.

## 2017-05-20 DIAGNOSIS — M5136 Other intervertebral disc degeneration, lumbar region: Secondary | ICD-10-CM | POA: Diagnosis not present

## 2017-05-20 DIAGNOSIS — M5416 Radiculopathy, lumbar region: Secondary | ICD-10-CM | POA: Diagnosis not present

## 2017-05-20 DIAGNOSIS — M48062 Spinal stenosis, lumbar region with neurogenic claudication: Secondary | ICD-10-CM | POA: Diagnosis not present

## 2017-05-31 NOTE — Telephone Encounter (Signed)
Error

## 2017-06-10 ENCOUNTER — Other Ambulatory Visit: Payer: Self-pay | Admitting: Internal Medicine

## 2017-06-11 DIAGNOSIS — R413 Other amnesia: Secondary | ICD-10-CM | POA: Diagnosis not present

## 2017-06-13 ENCOUNTER — Encounter: Payer: Self-pay | Admitting: Cardiovascular Disease

## 2017-06-13 ENCOUNTER — Ambulatory Visit (INDEPENDENT_AMBULATORY_CARE_PROVIDER_SITE_OTHER): Payer: Medicare Other | Admitting: Cardiovascular Disease

## 2017-06-13 VITALS — BP 110/60 | HR 68 | Ht 63.0 in | Wt 186.2 lb

## 2017-06-13 DIAGNOSIS — E7800 Pure hypercholesterolemia, unspecified: Secondary | ICD-10-CM | POA: Diagnosis not present

## 2017-06-13 DIAGNOSIS — G454 Transient global amnesia: Secondary | ICD-10-CM | POA: Diagnosis not present

## 2017-06-13 DIAGNOSIS — I481 Persistent atrial fibrillation: Secondary | ICD-10-CM | POA: Diagnosis not present

## 2017-06-13 DIAGNOSIS — I10 Essential (primary) hypertension: Secondary | ICD-10-CM | POA: Diagnosis not present

## 2017-06-13 DIAGNOSIS — I25118 Atherosclerotic heart disease of native coronary artery with other forms of angina pectoris: Secondary | ICD-10-CM | POA: Diagnosis not present

## 2017-06-13 DIAGNOSIS — I4819 Other persistent atrial fibrillation: Secondary | ICD-10-CM

## 2017-06-13 DIAGNOSIS — I5032 Chronic diastolic (congestive) heart failure: Secondary | ICD-10-CM

## 2017-06-13 DIAGNOSIS — I209 Angina pectoris, unspecified: Secondary | ICD-10-CM

## 2017-06-13 DIAGNOSIS — I2510 Atherosclerotic heart disease of native coronary artery without angina pectoris: Secondary | ICD-10-CM

## 2017-06-13 NOTE — Patient Instructions (Addendum)
Medication Instructions:   Please hold the plavix Stay on eliquis  Labwork:  No new labs needed  Testing/Procedures:  No further testing at this time   Follow-Up: It was a pleasure seeing you in the office today. Please call us if you have new issues that need to be addressed before your next appt.  [phone]  Your physician wants you to follow-up in: 12 months.  You will receive a reminder letter in the mail two months in advance. If you don't receive a letter, please call our office to schedule the follow-up appointment.  If you need a refill on your cardiac medications before your next appointment, please call your pharmacy.

## 2017-06-13 NOTE — Progress Notes (Signed)
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 other    2 month follow up; A-Fib. Meds reviewed by the pt. verbally. Pt. c/o shortness of breath.     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 female atrial fibrillation accompanied by bradycardia.  coronary artery disease  prior bypass surgery stent to  graft 8/17  Echo EF 1/18 with EF 60-65%   moderate to severe left atrial enlargement cardioversion that did not hold reverted to atrial fibrillation.  Who presents for routine follow-up of her chronic stable angina, atrial fibrillation  On verapamil for many years, tolerating this well She feels ready to come off the Plavix Reports having significant bruising, skin tears Tolerating eliquis 5 twice a day  Statin intolerance, tried 4 different medications  on Zetia. Cholesterol still above goal  History of bleeding from hemorrhoids. Stable recently  Does periodic exercise including Water aoreobics, [place] step Rate 110 - 120 bpm with exertion  Episode of amnesia 03/12/2017 Etiology unclear Workup including EEG No further episodes since that time  Carotid ultrasound reviewed with her in detail Moderate atherosclerotic plaque over the left carotid bulb   EKG personally reviewed by myself on todays visit Shows atrial fibrillation rate 68 bpm no significant ST or T-wave changes   PMH:   has a past medical history of Allergy; Arthritis; Asthma; CAD (coronary artery disease); Cancer ([place]); Carotid arterial disease ([place]); Chronic kidney disease; Colon polyps; Depression; GERD (gastroesophageal reflux disease); Headache; Heart murmur; History of chicken pox; History of hiatal hernia; migraines; UTI (urinary tract infection); Hyperlipidemia; Hypertensive heart disease; Lichen planus; Melanoma ([place]); Palpitations; PMR (polymyalgia rheumatica) (HCC); PSVT (paroxysmal  supraventricular tachycardia) ([place]); PUD (peptic ulcer disease); Raynaud's phenomenon; Spinal stenosis; Urine incontinence; and Vitamin D deficiency.  PSH:    Past Surgical History:  Procedure Laterality Date  . ADENOIDECTOMY     age 48  . BACK SURGERY     L3-L5  . BILATERAL CARPAL TUNNEL RELEASE    . BREAST BIOPSY Right    bx x 3 neg  . BREAST SURGERY Right    biopsy x 3 (all benign)  . CARDIAC CATHETERIZATION N/A 06/01/2016   Procedure: LEFT HEART CATH AND CORS/GRAFTS ANGIOGRAPHY;  Surgeon: Wellington Hampshire, MD;  Location: [facility];  Service: Cardiovascular;  Laterality: N/A;  . CARDIAC CATHETERIZATION N/A 06/01/2016   Procedure: Coronary Stent Intervention;  Surgeon: Wellington Hampshire, MD;  Location: [facility];  Service: Cardiovascular;  Laterality: N/A;  . CARDIOVERSION N/A 03/08/2017   Procedure: CARDIOVERSION;  Surgeon: Wellington Hampshire, MD;  Location: [facility];  Service: Cardiovascular;  Laterality: N/A;  . CATARACT EXTRACTION W/PHACO Right 01/04/2016   Procedure: CATARACT EXTRACTION PHACO AND INTRAOCULAR LENS PLACEMENT ([place]);  Surgeon: Leandrew Koyanagi, MD;  Location: [facility];  Service: Ophthalmology;  Laterality: Right;  MALYUGIN  . CATARACT EXTRACTION W/PHACO Left 01/25/2016   Procedure: CATARACT EXTRACTION PHACO AND INTRAOCULAR LENS PLACEMENT ([place]) left eye;  Surgeon: Leandrew Koyanagi, MD;  Location: [facility];  Service: Ophthalmology;  Laterality: Left;  MALYUGIN SHUGARCAINE  . CHOLECYSTECTOMY  90's  . COLONOSCOPY WITH PROPOFOL N/A 05/03/2015   Procedure: COLONOSCOPY WITH PROPOFOL;  Surgeon: Lollie Sails, MD;  Location[facility];  Service: Endoscopy;  Laterality: N/A;  . CORONARY ARTERY BYPASS GRAFT  98   4  vessel  . ESOPHAGOGASTRODUODENOSCOPY N/A 03/22/2015   Procedure: ESOPHAGOGASTRODUODENOSCOPY (EGD);  Surgeon: Lollie Sails, MD;  Location: [facility] ENDOSCOPY;  Service: Endoscopy;  Laterality: N/A;  . JOINT REPLACEMENT      BILATERAL KNEE REPLACEMENTS  . KNEE ARTHROSCOPY W/ OATS PROCEDURE     Lt knee (9/01), Rt knee (3/11), Lt hip (5/10)  . TOTAL HIP ARTHROPLASTY      Current Outpatient Prescriptions  Medication Sig Dispense Refill  . acetaminophen (TYLENOL) 500 MG tablet Take 1,000 mg by mouth 2 (two) times daily.     [name] ADVAIR DISKUS 250-50 MCG/DOSE AEPB inhale 1 puff INTO THE LUNGS twice a day 60 each 1  . amitriptyline (ELAVIL) 25 MG tablet Take 1-2 tablets (25-50 mg total) by mouth at bedtime. (Patient taking differently: Take 25 mg by mouth at bedtime. ) 60 tablet 5  . ATROVENT HFA 17 MCG/ACT inhaler inhale 2 puffs INTO THE LUNGS every 6 hours if needed for wheezing 12.9 g 2  . b complex vitamins tablet Take 1 tablet by mouth daily.    . Cholecalciferol (VITAMIN D3) 2000 UNITS TABS Take 2,000 Units by mouth daily.     . Coenzyme Q10 (COQ-10) 100 MG CAPS Take 100 mg by mouth daily.     [name] ELIQUIS 5 MG TABS tablet take 1 tablet by mouth twice a day 60 tablet 6  . ezetimibe (ZETIA) 10 MG tablet take 1 tablet by mouth once daily 30 tablet 7  . folic acid (FOLVITE) 1 MG tablet take 1 tablet by mouth once daily 30 tablet 5  . furosemide (LASIX) 20 MG tablet Take 0.5 tablets (10 mg total) by mouth daily. (Patient taking differently: Take 20 mg by mouth daily as needed (for weight gain of 2 lbs or more). ) 15 tablet 3  . hydrocortisone 2.5 % cream apply to VULVA twice a day AT 10AM AND 4 PM (Patient taking differently: apply to VULVA at bedtime as needed for with miconazole for lichen planus) 28 g 0  . ketoconazole (NIZORAL) 2 % cream Apply 1 application topically daily. (Patient taking differently: Apply 1 application topically daily as needed (for crack bleeding fingers.). ) 30 g 0  . loratadine (CLARITIN) 10 MG tablet Take 10 mg by mouth daily.    . meclizine (ANTIVERT) 25 MG tablet Take 25 mg by mouth 3 (three) times daily as needed for dizziness.    . miconazole (MICOTIN) 2 % cream Apply 1 application  topically at bedtime as needed (with hydrocortisone for lichen planus).     . Multiple Vitamins-Minerals (HAIR/SKIN/NAILS PO) Take 1 tablet by mouth daily.    . nitroGLYCERIN (NITROSTAT) 0.4 MG SL tablet Place 1 tablet (0.4 mg total) under the tongue every 5 (five) minutes as needed for chest pain. Place 1 tablet (0.4 mg total) under the tongue every 5 (five) minutes , 3 times as needed for chest pain. 15 tablet 0  . Omega-3 Fatty Acids (FISH OIL PO) Take 2,000 mg by mouth daily.    . pantoprazole (PROTONIX) 40 MG tablet take 1 tablet by mouth twice a day 60 tablet 5  . polyethylene glycol powder (GLYCOLAX/MIRALAX) powder Take 8.5 g by mouth once.    . ramipril (ALTACE) 10 MG capsule take 1 capsule by mouth once daily 30 capsule 3  . spironolactone (ALDACTONE) 25 MG tablet take 1 tablet by mouth every morning 30 tablet 5  . traMADol (ULTRAM) 50 MG tablet Take 1 tablet (50 mg total) by mouth daily as  needed. 20 tablet 0  . traZODone (DESYREL) 150 MG tablet TAKE 1 AND 2/3 TABLETS TO 2 TABLETS BY MOUTH AT BEDTIME AS DIRECTED (Patient taking differently: TAKE 1 AND 1/2 TABLETS TO 2 TABLETS BY MOUTH AT BEDTIME AS DIRECTED) 60 tablet 2  . verapamil (CALAN-SR) 180 MG CR tablet take 1 tablet by mouth twice a day 60 tablet 5  . vitamin B-12 (CYANOCOBALAMIN) 1000 MCG tablet Take 1,000 mcg by mouth daily.    . vitamin C (ASCORBIC ACID) 500 MG tablet Take 500 mg by mouth at bedtime.     No current facility-administered medications for this visit.      Allergies:   Beta adrenergic blockers; Brilinta [ticagrelor]; Albuterol; Aspirin; Nsaids; Penicillins; Statins; Sulfa antibiotics; Tetracycline; and Tetracyclines & related   Social History:  The patient  reports that she has quit smoking. She has never used smokeless tobacco. She reports that she does not drink alcohol or use drugs.   Family History:   family history includes Alcohol abuse in her father; Depression in her daughter, father, maternal  grandmother, and son; Heart disease in her father; Hypertension in her maternal grandfather and paternal grandfather; Lung cancer in her sister; Seizures in her son; Stroke in her maternal grandmother; Thyroid disease in her daughter and mother.    Review of Systems: Review of Systems  Constitutional: Negative.   Respiratory: Negative.   Cardiovascular: Negative.   Gastrointestinal: Negative.   Musculoskeletal: Negative.   Neurological: Negative.   Psychiatric/Behavioral: Negative.   All other systems reviewed and are negative.    PHYSICAL EXAM: VS:  BP 110/60 (BP Location: Right Arm, Patient Position: Sitting, Cuff Size: Normal)   Pulse 68   Ht 5\' 3"  (1.6 m)   Wt 186 lb 4 oz (84.5 kg)   BMI 32.99 kg/m  , BMI Body mass index is 32.99 kg/m. GEN: Well nourished, well developed, in no acute distress, presenting with a cane HEENT: normal  Neck: no JVD, carotid bruits, or masses Cardiac: RRR; no murmurs, rubs, or gallops,no edema  Respiratory:  clear to auscultation bilaterally, normal work of breathing GI: soft, nontender, nondistended, + BS MS: no deformity or atrophy  Skin: warm and dry, no rash Neuro:  Strength and sensation are intact Psych: euthymic mood, full affect    Recent Labs: 10/08/2016: B Natriuretic Peptide 566.0 03/12/2017: ALT 22; Magnesium 2.0; TSH 1.473 03/13/2017: BUN 20; Creatinine, Ser 0.86; Hemoglobin 13.4; Platelets 200; Potassium 4.0; Sodium 136    Lipid Panel Lab Results  Component Value Date   CHOL 195 01/07/2017   HDL 57.20 01/07/2017   LDLCALC 124 (H) 01/07/2017   TRIG 73.0 01/07/2017      Wt Readings from Last 3 Encounters:  06/13/17 186 lb 4 oz (84.5 kg)  04/30/17 188 lb 12.8 oz (85.6 kg)  04/11/17 186 lb 12 oz (84.7 kg)       ASSESSMENT AND PLAN:  Persistent atrial fibrillation (HCC) - Plan: EKG 12-Lead Rate well controlled on verapamil Tolerating anticoagulation  Coronary artery disease of native artery of native heart with  stable angina pectoris ([place]) Long discussion with her, we will hold the Plavix She reports unable to tolerate aspirin Continue eliquis twice a day Denies any symptoms concerning for angina  Essential hypertension, benign Blood pressure is well controlled on today's visit. No changes made to the medications.  Hypercholesterolemia Unable to tolerate statins, She is tolerating Zetia She does not want new or agents such as repatha or praluent  Chronic diastolic heart  failure ([place]) Appears euvolemic on today's visit Nonpitting leg swelling We'll continue low-dose Lasix  TGA (transient global amnesia) Etiology unclear, Hospital records reviewed Unable to exclude TIA Currently on eliquis  Carotid stenosis Moderate in severity Unable to achieve goal cholesterol as she is statin intolerant  Disposition:   F/U  12 months   Total encounter time more than 45 minutes  Greater than 50% was spent in counseling and coordination of care with the patient    Orders Placed This Encounter  Procedures  . EKG 12-Lead     Signed, Esmond Plants, M.D., Ph.D. 06/13/2017  [place]

## 2017-07-15 ENCOUNTER — Other Ambulatory Visit: Payer: Self-pay | Admitting: Internal Medicine

## 2017-07-16 DIAGNOSIS — Z23 Encounter for immunization: Secondary | ICD-10-CM | POA: Diagnosis not present

## 2017-07-21 IMAGING — CR DG CHEST 2V
2 series · 2 of 2 positions shown · non-contrast
Comparison: 05/31/2016

CLINICAL DATA: Short of breath

EXAM:
CHEST  2 VIEW

[chest pa]
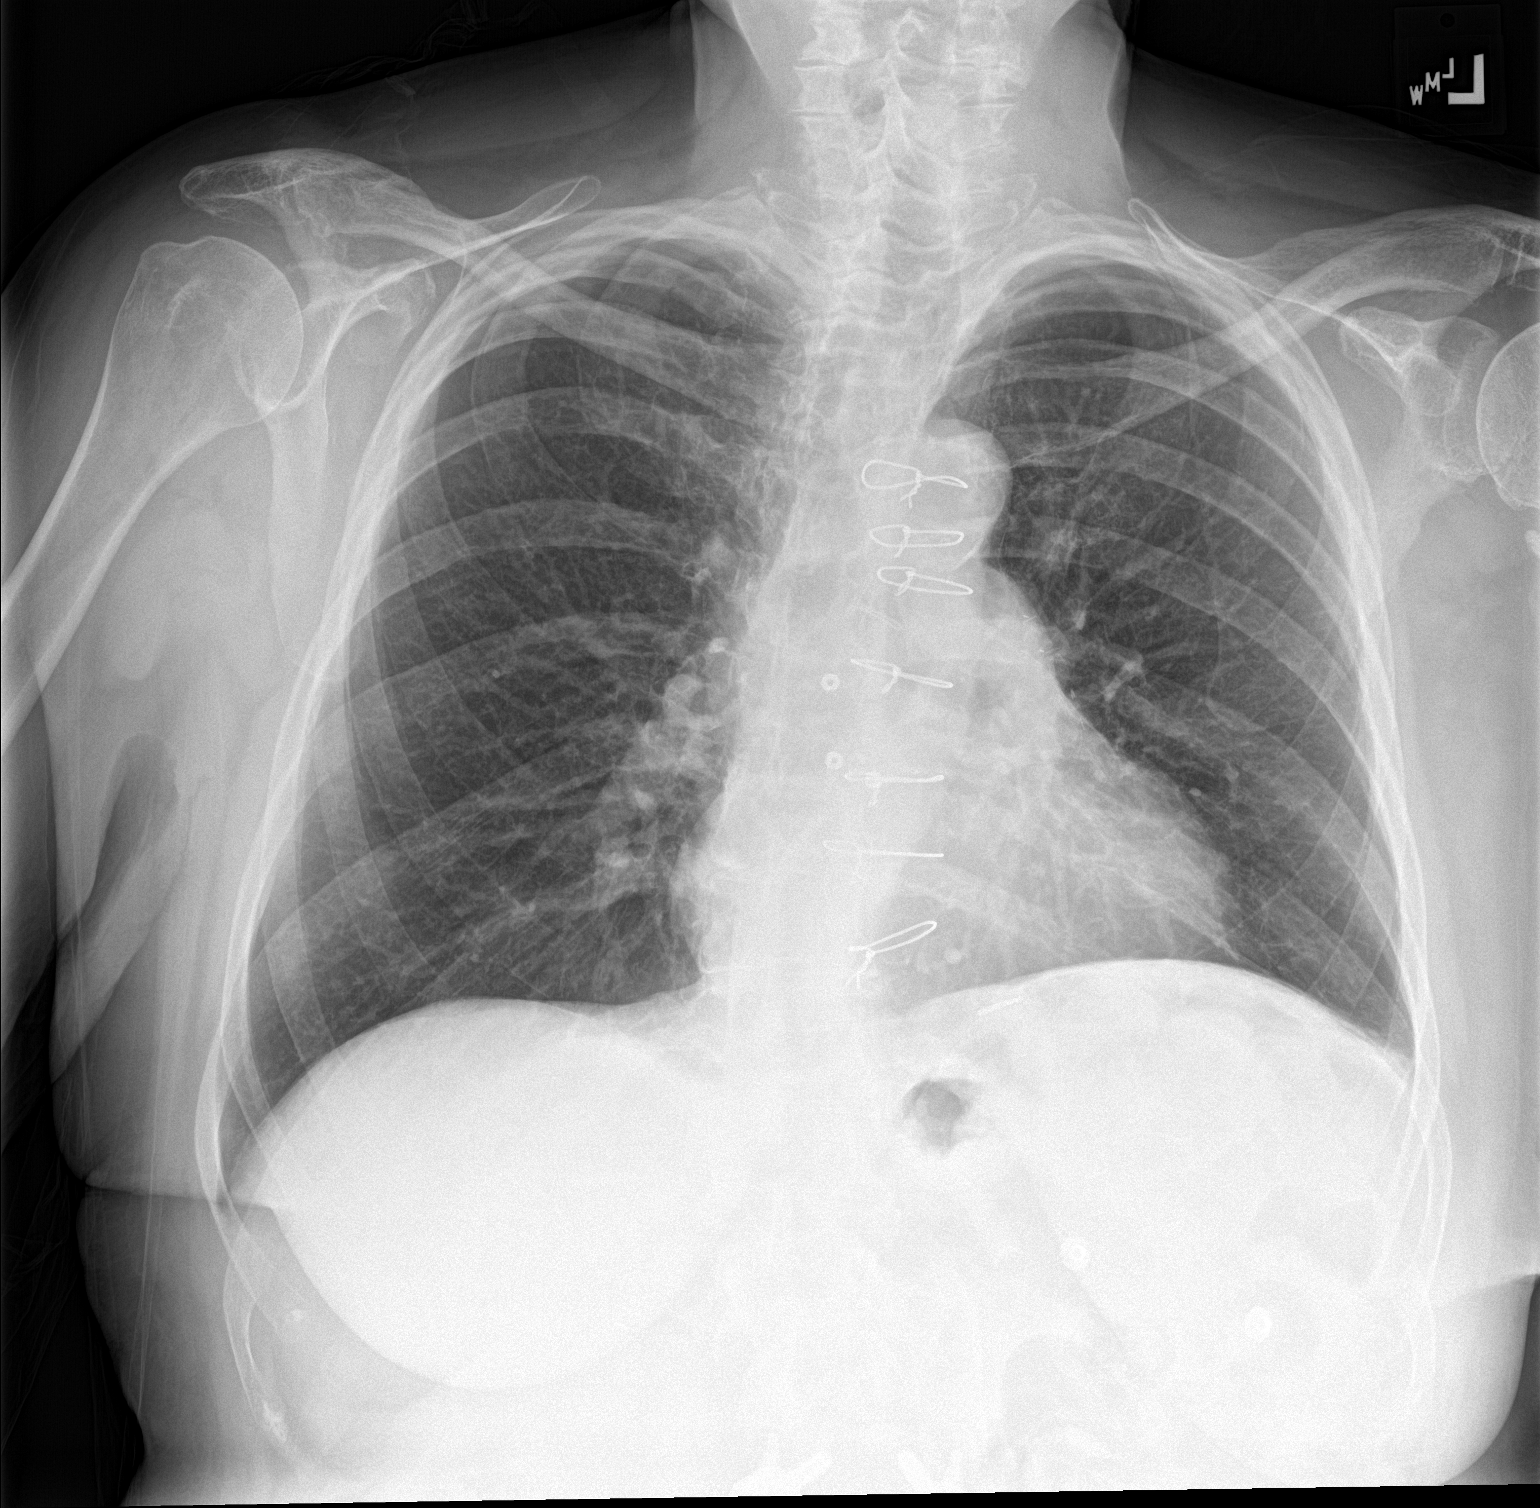

[chest lat]
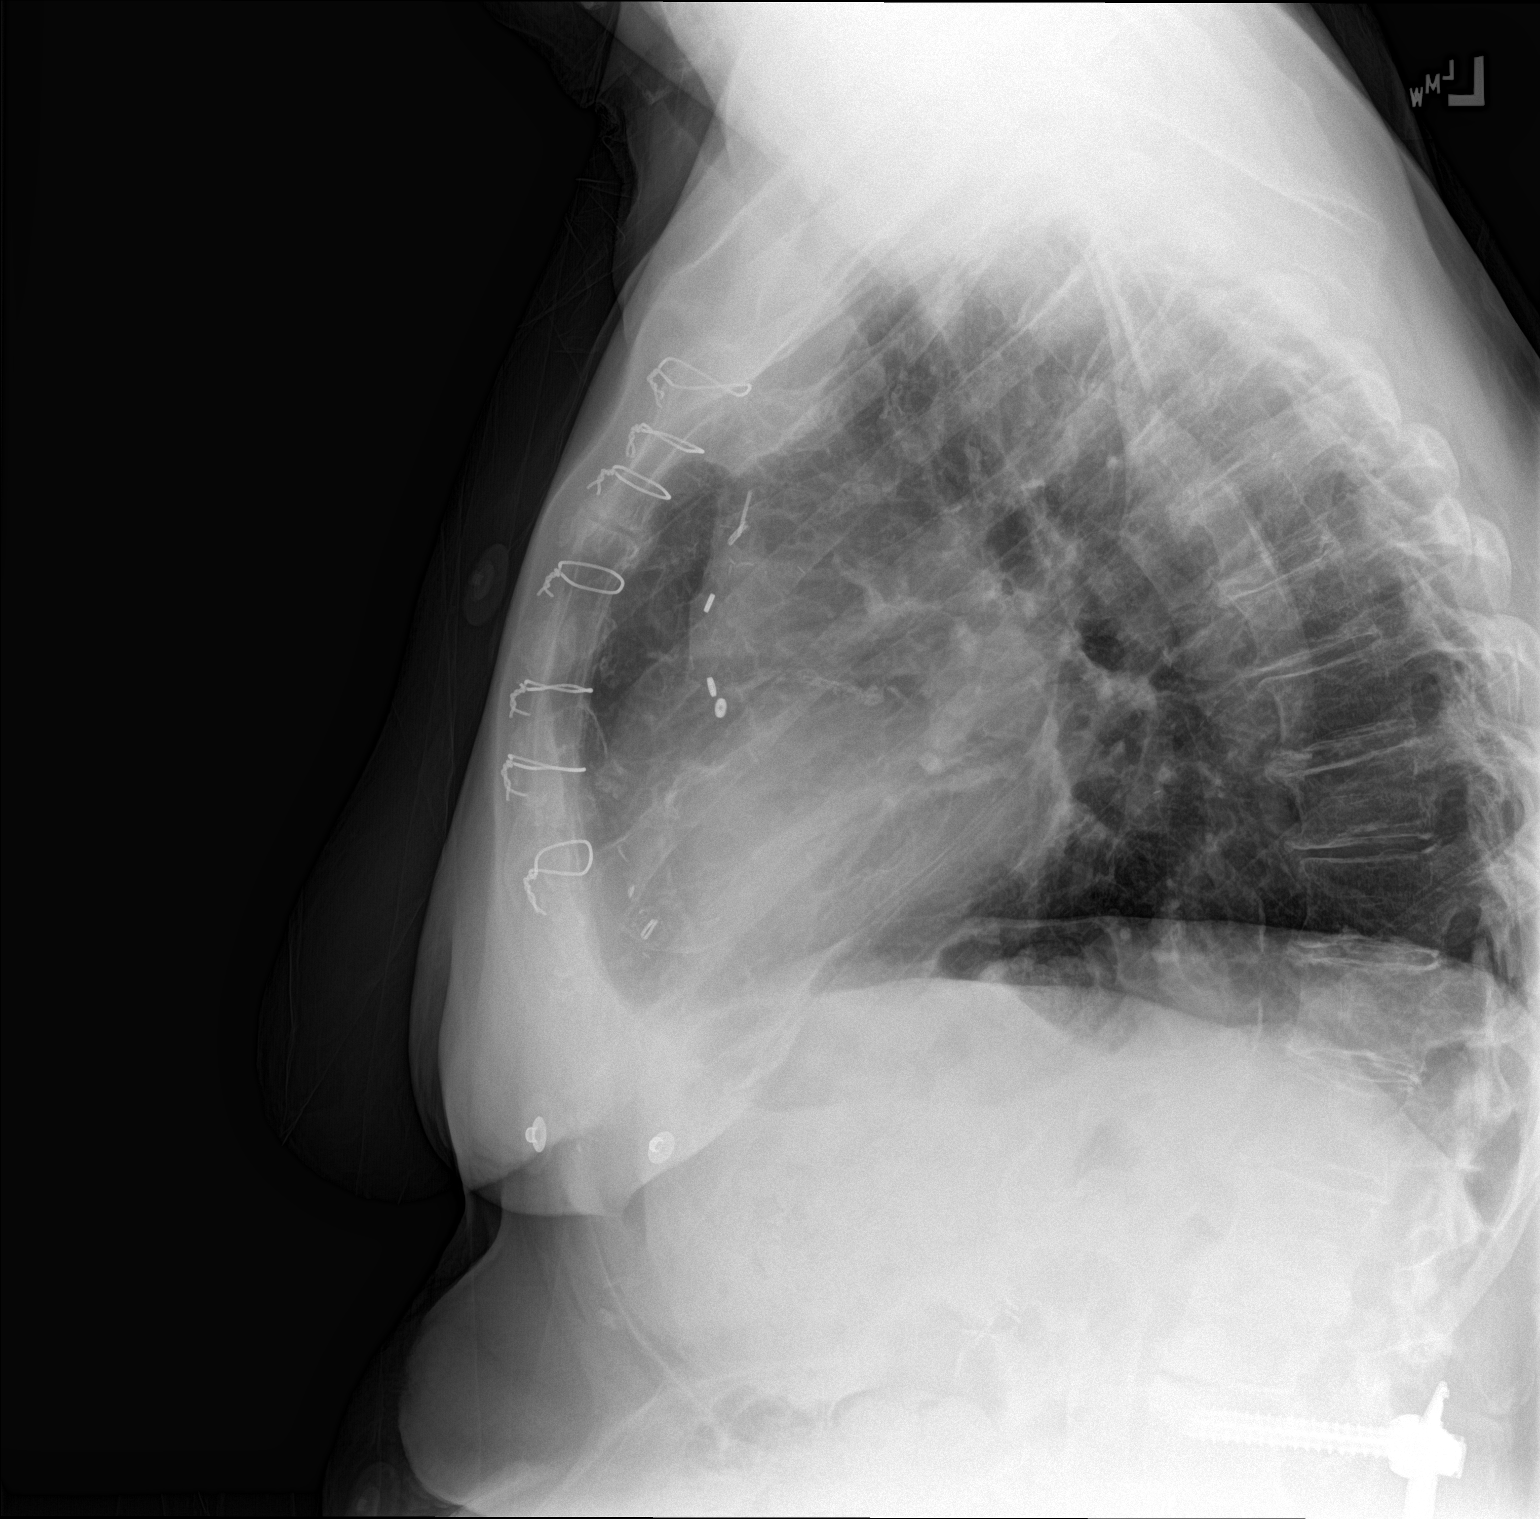

[2 of 2 positions shown; findings below may reference images not displayed]

FINDINGS: Normal heart size. Lungs clear. No pneumothorax. No pleural
effusion. Postoperative changes from CABG.
IMPRESSION: No active cardiopulmonary disease.

## 2017-07-29 ENCOUNTER — Other Ambulatory Visit: Payer: Self-pay | Admitting: Internal Medicine

## 2017-08-15 DIAGNOSIS — D2261 Melanocytic nevi of right upper limb, including shoulder: Secondary | ICD-10-CM | POA: Diagnosis not present

## 2017-08-15 DIAGNOSIS — Z85828 Personal history of other malignant neoplasm of skin: Secondary | ICD-10-CM | POA: Diagnosis not present

## 2017-08-15 DIAGNOSIS — D225 Melanocytic nevi of trunk: Secondary | ICD-10-CM | POA: Diagnosis not present

## 2017-08-15 DIAGNOSIS — D0472 Carcinoma in situ of skin of left lower limb, including hip: Secondary | ICD-10-CM | POA: Diagnosis not present

## 2017-08-15 DIAGNOSIS — Z85820 Personal history of malignant melanoma of skin: Secondary | ICD-10-CM | POA: Diagnosis not present

## 2017-08-29 ENCOUNTER — Other Ambulatory Visit: Payer: Self-pay | Admitting: Internal Medicine

## 2017-09-04 ENCOUNTER — Other Ambulatory Visit: Payer: Self-pay | Admitting: *Deleted

## 2017-09-04 MED ORDER — APIXABAN 5 MG PO TABS: 5.0000 mg | ORAL_TABLET | Freq: Two times a day (BID) | ORAL | 3 refills | Status: DC

## 2017-09-09 ENCOUNTER — Ambulatory Visit (INDEPENDENT_AMBULATORY_CARE_PROVIDER_SITE_OTHER): Payer: Medicare Other | Admitting: Internal Medicine

## 2017-09-09 ENCOUNTER — Telehealth: Payer: Self-pay | Admitting: Radiology

## 2017-09-09 ENCOUNTER — Other Ambulatory Visit: Payer: Self-pay | Admitting: Internal Medicine

## 2017-09-09 ENCOUNTER — Encounter: Payer: Self-pay | Admitting: Internal Medicine

## 2017-09-09 DIAGNOSIS — E7800 Pure hypercholesterolemia, unspecified: Secondary | ICD-10-CM

## 2017-09-09 DIAGNOSIS — E7849 Other hyperlipidemia: Secondary | ICD-10-CM

## 2017-09-09 DIAGNOSIS — K2100 Gastro-esophageal reflux disease with esophagitis, without bleeding: Secondary | ICD-10-CM

## 2017-09-09 DIAGNOSIS — I25118 Atherosclerotic heart disease of native coronary artery with other forms of angina pectoris: Secondary | ICD-10-CM

## 2017-09-09 DIAGNOSIS — E871 Hypo-osmolality and hyponatremia: Secondary | ICD-10-CM

## 2017-09-09 DIAGNOSIS — I481 Persistent atrial fibrillation: Secondary | ICD-10-CM

## 2017-09-09 DIAGNOSIS — F329 Major depressive disorder, single episode, unspecified: Secondary | ICD-10-CM

## 2017-09-09 DIAGNOSIS — I5032 Chronic diastolic (congestive) heart failure: Secondary | ICD-10-CM

## 2017-09-09 DIAGNOSIS — I4819 Other persistent atrial fibrillation: Secondary | ICD-10-CM

## 2017-09-09 DIAGNOSIS — I2510 Atherosclerotic heart disease of native coronary artery without angina pectoris: Secondary | ICD-10-CM

## 2017-09-09 DIAGNOSIS — G454 Transient global amnesia: Secondary | ICD-10-CM | POA: Diagnosis not present

## 2017-09-09 DIAGNOSIS — F32A Depression, unspecified: Secondary | ICD-10-CM

## 2017-09-09 DIAGNOSIS — M4800 Spinal stenosis, site unspecified: Secondary | ICD-10-CM

## 2017-09-09 DIAGNOSIS — J4520 Mild intermittent asthma, uncomplicated: Secondary | ICD-10-CM

## 2017-09-09 DIAGNOSIS — I10 Essential (primary) hypertension: Secondary | ICD-10-CM | POA: Diagnosis not present

## 2017-09-09 DIAGNOSIS — Z0000 Encounter for general adult medical examination without abnormal findings: Secondary | ICD-10-CM

## 2017-09-09 DIAGNOSIS — R739 Hyperglycemia, unspecified: Secondary | ICD-10-CM

## 2017-09-09 DIAGNOSIS — E559 Vitamin D deficiency, unspecified: Secondary | ICD-10-CM | POA: Diagnosis not present

## 2017-09-09 NOTE — Telephone Encounter (Signed)
Orders placed for labs

## 2017-09-09 NOTE — Assessment & Plan Note (Signed)
Physical today 09/09/17.  Colonoscopy 04/2015 with two 2-75mm polyps hepatic flexure and one 43mm polyp (sigmoid) and non bleeding internal hemorrhoids.  Mammogram 03/28/17 - [facility] I.

## 2017-09-09 NOTE — Progress Notes (Signed)
Order placed for labs.

## 2017-09-09 NOTE — Telephone Encounter (Signed)
Pt coming in for labs tomorrow, please place future orders. Thank you.  

## 2017-09-09 NOTE — Progress Notes (Signed)
Patient ID: Charlotte Wagner, Dr., female   DOB: 06/25/1936, 81 y.o.   MRN: [id number]   Subjective:    Patient ID: Charlotte Wagner, Dr., female    DOB: [birth year], 81 y.o.   MRN: [id number]  HPI  Patient with past history of asthma, CAD, GERD and atrial fib.  She comes in today to follow up on these issues as well as for a complete physical exam.  Recently saw Dr Rockey Situ.  On eliquis.  plavix held.  On zetia for cholesterol.  Felt stable and recommended 12 month f/u from the 06/2017 visit.  She feels things are stable.  Her main complaint is that of back and leg pain.  Saw Dr Sharlet Salina.  Tries to stay as active as possible.  No chest pain.  Heart rate stable.  Breathing stable.  No acid reflux.  No abdominal pain.  Bowels stable.  Handling stress.     Past Medical History:  Diagnosis Date  . Allergy   . Arthritis    s/p bilateral knees and left hip replacement  . Asthma   . CAD (coronary artery disease)    a. 12/1996 s/p CABG x4 ([place]);  b. 2005 Pt reports stress test & cath, which revealed patent grafts.  . Cancer ([place])    melanoma right arm  . Carotid arterial disease ([place])    a. 04/2015 Carotid U/S: <50% bilat ICA stenosis.  . Chronic kidney disease   . Colon polyps    H/O  . Depression   . GERD (gastroesophageal reflux disease)    h/o hiatal hernia  . Headache    migraines in past  . Heart murmur    a. 04/2011 Echo: EF 55-60%, bilat atrial enlargement, mild to mod TR.  [name] History of chicken pox   . History of hiatal hernia   . Hx of migraines    rare now  . Hx: UTI (urinary tract infection)   . Hyperlipidemia    a. Statin intolerant -->on zetia.  . Hypertensive heart disease   . Lichen planus   . Melanoma ([place])   . Palpitations    a. rare PVC's and h/o SVT.  [name] PMR (polymyalgia rheumatica) (HCC)    h/o in setting of crestor usage.  [name] PSVT (paroxysmal supraventricular tachycardia) ([place])   . PUD (peptic ulcer disease)    remote history  . Raynaud's phenomenon   . Spinal  stenosis   . Urine incontinence    H/O  . Vitamin D deficiency    Past Surgical History:  Procedure Laterality Date  . ADENOIDECTOMY     age 47  . BACK SURGERY     L3-L5  . BILATERAL CARPAL TUNNEL RELEASE    . BREAST BIOPSY Right    bx x 3 neg  . BREAST SURGERY Right    biopsy x 3 (all benign)  . CARDIAC CATHETERIZATION N/A 06/01/2016   Procedure: LEFT HEART CATH AND CORS/GRAFTS ANGIOGRAPHY;  Surgeon: Wellington Hampshire, MD;  Location: [facility];  Service: Cardiovascular;  Laterality: N/A;  . CARDIAC CATHETERIZATION N/A 06/01/2016   Procedure: Coronary Stent Intervention;  Surgeon: Wellington Hampshire, MD;  Location: [facility];  Service: Cardiovascular;  Laterality: N/A;  . CARDIOVERSION N/A 03/08/2017   Procedure: CARDIOVERSION;  Surgeon: Wellington Hampshire, MD;  Location: [facility];  Service: Cardiovascular;  Laterality: N/A;  . CATARACT EXTRACTION W/PHACO Right 01/04/2016   Procedure: CATARACT EXTRACTION PHACO AND INTRAOCULAR LENS PLACEMENT ([place]);  Surgeon: Nila Nephew  Brasington, MD;  Location: [facility];  Service: Ophthalmology;  Laterality: Right;  MALYUGIN  . CATARACT EXTRACTION W/PHACO Left 01/25/2016   Procedure: CATARACT EXTRACTION PHACO AND INTRAOCULAR LENS PLACEMENT ([place]) left eye;  Surgeon: Leandrew Koyanagi, MD;  Location: [place];  Service: Ophthalmology;  Laterality: Left;  MALYUGIN SHUGARCAINE  . CHOLECYSTECTOMY  90's  . COLONOSCOPY WITH PROPOFOL N/A 05/03/2015   Procedure: COLONOSCOPY WITH PROPOFOL;  Surgeon: Lollie Sails, MD;  Location: [facility];  Service: Endoscopy;  Laterality: N/A;  . CORONARY ARTERY BYPASS GRAFT  98   4 vessel  . ESOPHAGOGASTRODUODENOSCOPY N/A 03/22/2015   Procedure: ESOPHAGOGASTRODUODENOSCOPY (EGD);  Surgeon: Lollie Sails, MD;  Location: [facility] END[facility]COPY;  Service: Endoscopy;  Laterality: N/A;  . JOINT REPLACEMENT     BILATERAL KNEE REPLACEMENTS  . KNEE ARTHROSCOPY W/ OATS PROCEDURE     Lt knee  (9/01), Rt knee (3/11), Lt hip (5/10)  . TOTAL HIP ARTHROPLASTY     Family History  Problem Relation Age of Onset  . Thyroid disease Mother        graves disease  . Heart disease Father        rheumatic heart  . Alcohol abuse Father   . Depression Father   . Lung cancer Sister   . Depression Daughter   . Thyroid disease Daughter        hashimoto  . Depression Son   . Stroke Maternal Grandmother   . Depression Maternal Grandmother   . Hypertension Maternal Grandfather   . Hypertension Paternal Grandfather   . Seizures Son   . Breast cancer Neg Hx    Social History   Socioeconomic History  . Marital status: Married    Spouse name: None  . Number of children: 3  . Years of education: None  . Highest education level: None  Social Needs  . Financial resource strain: None  . Food insecurity - worry: None  . Food insecurity - inability: None  . Transportation needs - medical: None  . Transportation needs - non-medical: None  Occupational History  . Occupation: Retired Sport and exercise psychologist  Tobacco Use  . Smoking status: Former Research scientist (life sciences)  . Smokeless tobacco: Never Used  . Tobacco comment: quit 44+ yrs ago  Substance and Sexual Activity  . Alcohol use: No    Alcohol/week: 0.0 oz  . Drug use: No  . Sexual activity: No  Other Topics Concern  . None  Social History Narrative   Lives in [place].  Retired Engineer, drilling.  Relatively active.    Outpatient Encounter Medications as of 09/09/2017  Medication Sig  . acetaminophen (TYLENOL) 500 MG tablet Take 1,000 mg by mouth 2 (two) times daily.   [name] ADVAIR DISKUS 250-50 MCG/DOSE AEPB inhale 1 puff INTO THE LUNGS twice a day  . amitriptyline (ELAVIL) 25 MG tablet Take 1-2 tablets (25-50 mg total) by mouth at bedtime. (Patient taking differently: Take 25 mg by mouth at bedtime. )  . apixaban (ELIQUIS) 5 MG TABS tablet Take 1 tablet (5 mg total) by mouth 2 (two) times daily.  . ATROVENT HFA 17 MCG/ACT inhaler inhale 2 puffs INTO THE  LUNGS every 6 hours if needed for wheezing  . b complex vitamins tablet Take 1 tablet by mouth daily.  . Cholecalciferol (VITAMIN D3) 2000 UNITS TABS Take 2,000 Units by mouth daily.   . Coenzyme Q10 (COQ-10) 100 MG CAPS Take 100 mg by mouth daily.   [name] ezetimibe (ZETIA) 10 MG tablet take 1 tablet  by mouth once daily  . folic acid (FOLVITE) 1 MG tablet take 1 tablet by mouth once daily  . furosemide (LASIX) 20 MG tablet Take 0.5 tablets (10 mg total) by mouth daily. (Patient taking differently: Take 20 mg by mouth daily as needed (for weight gain of 2 lbs or more). )  . hydrocortisone 2.5 % cream apply to VULVA twice a day AT 10AM AND 4 PM (Patient taking differently: apply to VULVA at bedtime as needed for with miconazole for lichen planus)  . ketoconazole (NIZORAL) 2 % cream Apply 1 application topically daily. (Patient taking differently: Apply 1 application topically daily as needed (for crack bleeding fingers.). )  . loratadine (CLARITIN) 10 MG tablet Take 10 mg by mouth daily.  . meclizine (ANTIVERT) 25 MG tablet Take 25 mg by mouth 3 (three) times daily as needed for dizziness.  . miconazole (MICOTIN) 2 % cream Apply 1 application topically at bedtime as needed (with hydrocortisone for lichen planus).   . Multiple Vitamins-Minerals (HAIR/SKIN/NAILS PO) Take 1 tablet by mouth daily.  . nitroGLYCERIN (NITROSTAT) 0.4 MG SL tablet Place 1 tablet (0.4 mg total) under the tongue every 5 (five) minutes as needed for chest pain. Place 1 tablet (0.4 mg total) under the tongue every 5 (five) minutes , 3 times as needed for chest pain.  . Omega-3 Fatty Acids (FISH OIL PO) Take 2,000 mg by mouth daily.  . pantoprazole (PROTONIX) 40 MG tablet take 1 tablet by mouth twice a day  . polyethylene glycol powder (GLYCOLAX/MIRALAX) powder Take 8.5 g by mouth once.  . ramipril (ALTACE) 10 MG capsule take 1 capsule by mouth once daily  . spironolactone (ALDACTONE) 25 MG tablet take 1 tablet by mouth every morning    . traMADol (ULTRAM) 50 MG tablet Take 1 tablet (50 mg total) by mouth daily as needed.  . traZODone (DESYREL) 150 MG tablet TAKE 1 AND 2/3 TO 2 TABLETS BY MOUTH AT BEDTIME AS DIRECTED  . verapamil (CALAN-SR) 180 MG CR tablet take 1 tablet by mouth twice a day  . vitamin B-12 (CYANOCOBALAMIN) 1000 MCG tablet Take 1,000 mcg by mouth daily.  . vitamin C (ASCORBIC ACID) 500 MG tablet Take 500 mg by mouth at bedtime.   No facility-administered encounter medications on file as of 09/09/2017.     Review of Systems  Constitutional: Negative for appetite change and unexpected weight change.  HENT: Negative for congestion and sinus pressure.   Eyes: Negative for pain and visual disturbance.  Respiratory: Negative for cough and chest tightness.        Breathing stable.   Cardiovascular: Negative for chest pain, palpitations and leg swelling.  Gastrointestinal: Negative for abdominal pain, diarrhea, nausea and vomiting.  Genitourinary: Negative for difficulty urinating and dysuria.  Musculoskeletal: Positive for back pain. Negative for joint swelling.  Skin: Negative for color change and rash.  Neurological: Negative for dizziness, light-headedness and headaches.  Hematological: Negative for adenopathy. Does not bruise/bleed easily.  Psychiatric/Behavioral: Negative for agitation and dysphoric mood.       Objective:     Blood pressure rechecked by me:  114/68  Physical Exam  Constitutional: She is oriented to person, place, and time. She appears well-developed and well-nourished. No distress.  HENT:  Nose: Nose normal.  Mouth/Throat: Oropharynx is clear and moist.  Eyes: Right eye exhibits no discharge. Left eye exhibits no discharge. No scleral icterus.  Neck: Neck supple. No thyromegaly present.  Cardiovascular: Normal rate.  Irregular.  Rate controlled.  Pulmonary/Chest: Breath sounds normal. No accessory muscle usage. No tachypnea. No respiratory distress. She has no decreased breath  sounds. She has no wheezes. She has no rhonchi. Right breast exhibits no inverted nipple, no mass, no nipple discharge and no tenderness (no axillary adenopathy). Left breast exhibits no inverted nipple, no mass, no nipple discharge and no tenderness (no axilarry adenopathy).  Abdominal: Soft. Bowel sounds are normal. There is no tenderness.  Musculoskeletal: She exhibits no edema or tenderness.  Lymphadenopathy:    She has no cervical adenopathy.  Neurological: She is alert and oriented to person, place, and time.  Skin: Skin is warm. No rash noted. No erythema.  Psychiatric: She has a normal mood and affect. Her behavior is normal.    BP 118/76   Pulse 76   Temp 98.3 F (36.8 C)   Resp 18   Ht 5\' 3"  (1.6 m)   Wt 190 lb 6 oz (86.4 kg)   SpO2 96%   BMI 33.72 kg/m  Wt Readings from Last 3 Encounters:  09/09/17 190 lb 6 oz (86.4 kg)  06/13/17 186 lb 4 oz (84.5 kg)  04/30/17 188 lb 12.8 oz (85.6 kg)     Lab Results  Component Value Date   WBC 5.0 03/13/2017   HGB 13.4 03/13/2017   HCT 39.1 03/13/2017   PLT 200 03/13/2017   GLUCOSE 116 (H) 03/13/2017   CHOL 195 01/07/2017   TRIG 73.0 01/07/2017   HDL 57.20 01/07/2017   LDLDIRECT 126.7 07/03/2013   LDLCALC 124 (H) 01/07/2017   ALT 22 03/12/2017   AST 41 03/12/2017   NA 136 03/13/2017   K 4.0 03/13/2017   CL 104 03/13/2017   CREATININE 0.86 03/13/2017   BUN 20 03/13/2017   CO2 25 03/13/2017   TSH 1.473 03/12/2017   INR 1.13 03/12/2017   HGBA1C 5.6 03/12/2017    Mm Screening Breast Tomo Bilateral  Result Date: 03/28/2017 CLINICAL DATA:  Screening. EXAM: 2D DIGITAL SCREENING BILATERAL MAMMOGRAM WITH CAD AND ADJUNCT TOMO COMPARISON:  Previous exam(s). ACR Breast Density Category b: There are scattered areas of fibroglandular density. FINDINGS: There are no findings suspicious for malignancy. Images were processed with CAD. IMPRESSION: No mammographic evidence of malignancy. A result letter of this screening mammogram will  be mailed directly to the patient. RECOMMENDATION: Screening mammogram in one year. (Code:SM-B-01Y) BI-RADS CATEGORY  1: Negative. Electronically Signed   By: Dorise Bullion III M.D   On: 03/28/2017 16:19       Assessment & Plan:   Problem List Items Addressed This Visit    Asthma    Breathing stable.        CAD (coronary artery disease)    S/p DES - SVG to RCA 05/2016.  Followed by cardiology.  Off plavix.  On eliquis.  Currently stable.        Chronic diastolic heart failure (HCC)    No evidence of volume overload on exam.  Same medication regimen.  Continue f/u with cardiology.        Depression    Stable.  Follow.        Essential hypertension, benign    Blood pressure under good control.  Continue same medication regimen.  Follow pressures.  Follow metabolic panel.        GERD (gastroesophageal reflux disease)    Controlled on current medication regimen.  Follow.        Health care maintenance    Physical today 09/09/17.  Colonoscopy 04/2015 with two  2-29mm polyps hepatic flexure and one 74mm polyp (sigmoid) and non bleeding internal hemorrhoids.  Mammogram 03/28/17 - [facility] I.        Hyperlipidemia    On zetia.  Intolerant to statin medication.  Follow lipid panel.        Persistent atrial fibrillation ([place])    On eliquis.  Rated controlled.  Stable.  Continue f/u with Dr Rockey Situ.        Spinal stenosis    Chronic back pain and leg pain.  Saw Dr Sharlet Salina.  No further intervention.  Follow.        TGA (transient global amnesia)    Saw neurology.  Note reviewed.  No recurrences.  Follow.        Vitamin D deficiency    Follow vitamin D level.            [name], MD

## 2017-09-10 ENCOUNTER — Other Ambulatory Visit (INDEPENDENT_AMBULATORY_CARE_PROVIDER_SITE_OTHER): Payer: Medicare Other

## 2017-09-10 DIAGNOSIS — I481 Persistent atrial fibrillation: Secondary | ICD-10-CM

## 2017-09-10 DIAGNOSIS — E7800 Pure hypercholesterolemia, unspecified: Secondary | ICD-10-CM | POA: Diagnosis not present

## 2017-09-10 DIAGNOSIS — R739 Hyperglycemia, unspecified: Secondary | ICD-10-CM | POA: Diagnosis not present

## 2017-09-10 DIAGNOSIS — I10 Essential (primary) hypertension: Secondary | ICD-10-CM

## 2017-09-10 DIAGNOSIS — I4819 Other persistent atrial fibrillation: Secondary | ICD-10-CM

## 2017-09-10 LAB — BASIC METABOLIC PANEL
BUN: 16 mg/dL (ref 6–23)
CALCIUM: 9.7 mg/dL (ref 8.4–10.5)
CHLORIDE: 101 meq/L (ref 96–112)
CO2: 29 mEq/L (ref 19–32)
CREATININE: 0.9 mg/dL (ref 0.40–1.20)
GFR: 64.13 mL/min (ref >60.00)
Glucose, Bld: 104 mg/dL — ABNORMAL HIGH (ref 70–99)
Potassium: 4.3 mEq/L (ref 3.5–5.1)
Sodium: 138 mEq/L (ref 135–145)

## 2017-09-10 LAB — LIPID PANEL
CHOLESTEROL: 209 mg/dL — AB (ref 0–200)
HDL: 59.4 mg/dL (ref >39.00)
LDL Cholesterol: 131 mg/dL — ABNORMAL HIGH (ref 0–99)
NonHDL: 150.09
Total CHOL/HDL Ratio: 4
Triglycerides: 95 mg/dL (ref 0.0–149.0)
VLDL: 19 mg/dL (ref 0.0–40.0)

## 2017-09-10 LAB — HEPATIC FUNCTION PANEL
ALK PHOS: 56 U/L (ref 39–117)
ALT: 20 U/L (ref 0–35)
AST: 22 U/L (ref 0–37)
Albumin: 4.6 g/dL (ref 3.5–5.2)
BILIRUBIN DIRECT: 0.1 mg/dL (ref 0.0–0.3)
BILIRUBIN TOTAL: 0.7 mg/dL (ref 0.2–1.2)
Total Protein: 6.7 g/dL (ref 6.0–8.3)

## 2017-09-10 LAB — HEMOGLOBIN A1C: Hgb A1c MFr Bld: 5.7 % (ref 4.6–6.5)

## 2017-09-10 LAB — TSH: TSH: 1.09 u[IU]/mL (ref 0.35–4.50)

## 2017-09-10 NOTE — Assessment & Plan Note (Signed)
Blood pressure under good control.  Continue same medication regimen.  Follow pressures.  Follow metabolic panel.   

## 2017-09-10 NOTE — Assessment & Plan Note (Signed)
On eliquis.  Rated controlled.  Stable.  Continue f/u with Dr Rockey Situ.

## 2017-09-10 NOTE — Assessment & Plan Note (Addendum)
S/p DES - SVG to RCA 05/2016.  Followed by cardiology.  Off plavix.  On eliquis.  Currently stable.

## 2017-09-10 NOTE — Assessment & Plan Note (Signed)
On zetia.  Intolerant to statin medication.  Follow lipid panel.

## 2017-09-10 NOTE — Assessment & Plan Note (Signed)
Controlled on current medication regimen.  Follow.   

## 2017-09-10 NOTE — Assessment & Plan Note (Signed)
Stable.  Follow.   

## 2017-09-10 NOTE — Assessment & Plan Note (Signed)
No evidence of volume overload on exam.  Same medication regimen.  Continue f/u with cardiology.

## 2017-09-10 NOTE — Assessment & Plan Note (Signed)
Breathing stable.

## 2017-09-10 NOTE — Assessment & Plan Note (Signed)
Follow vitamin D level.  

## 2017-09-10 NOTE — Assessment & Plan Note (Signed)
Chronic back pain and leg pain.  Saw Dr Sharlet Salina.  No further intervention.  Follow.

## 2017-09-10 NOTE — Assessment & Plan Note (Signed)
Saw neurology.  Note reviewed.  No recurrences.  Follow.

## 2017-09-12 ENCOUNTER — Other Ambulatory Visit: Payer: Self-pay | Admitting: Internal Medicine

## 2017-09-18 ENCOUNTER — Encounter: Payer: Self-pay | Admitting: *Deleted

## 2017-09-24 DIAGNOSIS — H43813 Vitreous degeneration, bilateral: Secondary | ICD-10-CM | POA: Diagnosis not present

## 2017-10-28 ENCOUNTER — Other Ambulatory Visit: Payer: Self-pay | Admitting: Internal Medicine

## 2017-10-29 DIAGNOSIS — I739 Peripheral vascular disease, unspecified: Secondary | ICD-10-CM | POA: Diagnosis not present

## 2017-10-29 DIAGNOSIS — S2231XA Fracture of one rib, right side, initial encounter for closed fracture: Secondary | ICD-10-CM | POA: Diagnosis not present

## 2017-10-29 DIAGNOSIS — S60212D Contusion of left wrist, subsequent encounter: Secondary | ICD-10-CM | POA: Diagnosis not present

## 2017-10-29 DIAGNOSIS — S299XXA Unspecified injury of thorax, initial encounter: Secondary | ICD-10-CM | POA: Diagnosis not present

## 2017-10-29 DIAGNOSIS — S23151D Dislocation of T8/T9 thoracic vertebra, subsequent encounter: Secondary | ICD-10-CM | POA: Diagnosis not present

## 2017-10-29 DIAGNOSIS — E669 Obesity, unspecified: Secondary | ICD-10-CM | POA: Diagnosis present

## 2017-10-29 DIAGNOSIS — I2510 Atherosclerotic heart disease of native coronary artery without angina pectoris: Secondary | ICD-10-CM | POA: Diagnosis present

## 2017-10-29 DIAGNOSIS — R0989 Other specified symptoms and signs involving the circulatory and respiratory systems: Secondary | ICD-10-CM | POA: Diagnosis not present

## 2017-10-29 DIAGNOSIS — R262 Difficulty in walking, not elsewhere classified: Secondary | ICD-10-CM | POA: Diagnosis not present

## 2017-10-29 DIAGNOSIS — J45909 Unspecified asthma, uncomplicated: Secondary | ICD-10-CM | POA: Diagnosis present

## 2017-10-29 DIAGNOSIS — Z96652 Presence of left artificial knee joint: Secondary | ICD-10-CM | POA: Diagnosis not present

## 2017-10-29 DIAGNOSIS — M47812 Spondylosis without myelopathy or radiculopathy, cervical region: Secondary | ICD-10-CM | POA: Diagnosis not present

## 2017-10-29 DIAGNOSIS — M6281 Muscle weakness (generalized): Secondary | ICD-10-CM | POA: Diagnosis not present

## 2017-10-29 DIAGNOSIS — S8001XA Contusion of right knee, initial encounter: Secondary | ICD-10-CM | POA: Diagnosis not present

## 2017-10-29 DIAGNOSIS — K7689 Other specified diseases of liver: Secondary | ICD-10-CM | POA: Diagnosis not present

## 2017-10-29 DIAGNOSIS — S8001XD Contusion of right knee, subsequent encounter: Secondary | ICD-10-CM | POA: Diagnosis not present

## 2017-10-29 DIAGNOSIS — M25062 Hemarthrosis, left knee: Secondary | ICD-10-CM | POA: Diagnosis not present

## 2017-10-29 DIAGNOSIS — S8012XA Contusion of left lower leg, initial encounter: Secondary | ICD-10-CM | POA: Diagnosis not present

## 2017-10-29 DIAGNOSIS — I509 Heart failure, unspecified: Secondary | ICD-10-CM | POA: Diagnosis present

## 2017-10-29 DIAGNOSIS — R079 Chest pain, unspecified: Secondary | ICD-10-CM | POA: Diagnosis not present

## 2017-10-29 DIAGNOSIS — S3991XA Unspecified injury of abdomen, initial encounter: Secondary | ICD-10-CM | POA: Diagnosis not present

## 2017-10-29 DIAGNOSIS — S3992XA Unspecified injury of lower back, initial encounter: Secondary | ICD-10-CM | POA: Diagnosis not present

## 2017-10-29 DIAGNOSIS — N189 Chronic kidney disease, unspecified: Secondary | ICD-10-CM | POA: Diagnosis present

## 2017-10-29 DIAGNOSIS — Z7901 Long term (current) use of anticoagulants: Secondary | ICD-10-CM | POA: Diagnosis not present

## 2017-10-29 DIAGNOSIS — S20219A Contusion of unspecified front wall of thorax, initial encounter: Secondary | ICD-10-CM | POA: Diagnosis present

## 2017-10-29 DIAGNOSIS — G629 Polyneuropathy, unspecified: Secondary | ICD-10-CM | POA: Diagnosis present

## 2017-10-29 DIAGNOSIS — S8011XA Contusion of right lower leg, initial encounter: Secondary | ICD-10-CM | POA: Diagnosis not present

## 2017-10-29 DIAGNOSIS — S22070A Wedge compression fracture of T9-T10 vertebra, initial encounter for closed fracture: Secondary | ICD-10-CM | POA: Diagnosis not present

## 2017-10-29 DIAGNOSIS — Z96642 Presence of left artificial hip joint: Secondary | ICD-10-CM | POA: Diagnosis not present

## 2017-10-29 DIAGNOSIS — I4891 Unspecified atrial fibrillation: Secondary | ICD-10-CM | POA: Diagnosis not present

## 2017-10-29 DIAGNOSIS — M25532 Pain in left wrist: Secondary | ICD-10-CM | POA: Diagnosis not present

## 2017-10-29 DIAGNOSIS — I130 Hypertensive heart and chronic kidney disease with heart failure and stage 1 through stage 4 chronic kidney disease, or unspecified chronic kidney disease: Secondary | ICD-10-CM | POA: Diagnosis present

## 2017-10-29 DIAGNOSIS — S20219D Contusion of unspecified front wall of thorax, subsequent encounter: Secondary | ICD-10-CM | POA: Diagnosis not present

## 2017-10-29 DIAGNOSIS — M5030 Other cervical disc degeneration, unspecified cervical region: Secondary | ICD-10-CM | POA: Diagnosis not present

## 2017-10-29 DIAGNOSIS — E785 Hyperlipidemia, unspecified: Secondary | ICD-10-CM | POA: Diagnosis present

## 2017-10-29 DIAGNOSIS — Z951 Presence of aortocoronary bypass graft: Secondary | ICD-10-CM | POA: Diagnosis not present

## 2017-10-29 DIAGNOSIS — S6292XA Unspecified fracture of left wrist and hand, initial encounter for closed fracture: Secondary | ICD-10-CM | POA: Diagnosis not present

## 2017-10-29 DIAGNOSIS — D649 Anemia, unspecified: Secondary | ICD-10-CM | POA: Diagnosis not present

## 2017-10-29 DIAGNOSIS — R402362 Coma scale, best motor response, obeys commands, at arrival to emergency department: Secondary | ICD-10-CM | POA: Diagnosis present

## 2017-10-29 DIAGNOSIS — J449 Chronic obstructive pulmonary disease, unspecified: Secondary | ICD-10-CM | POA: Diagnosis present

## 2017-10-29 DIAGNOSIS — S2241XA Multiple fractures of ribs, right side, initial encounter for closed fracture: Secondary | ICD-10-CM | POA: Diagnosis not present

## 2017-10-29 DIAGNOSIS — S8002XD Contusion of left knee, subsequent encounter: Secondary | ICD-10-CM | POA: Diagnosis not present

## 2017-10-29 DIAGNOSIS — S8002XA Contusion of left knee, initial encounter: Secondary | ICD-10-CM | POA: Diagnosis not present

## 2017-10-29 DIAGNOSIS — S8992XA Unspecified injury of left lower leg, initial encounter: Secondary | ICD-10-CM | POA: Diagnosis not present

## 2017-10-29 DIAGNOSIS — S63005A Unspecified dislocation of left wrist and hand, initial encounter: Secondary | ICD-10-CM | POA: Diagnosis not present

## 2017-10-29 DIAGNOSIS — S2020XA Contusion of thorax, unspecified, initial encounter: Secondary | ICD-10-CM | POA: Diagnosis not present

## 2017-10-29 DIAGNOSIS — M4854XD Collapsed vertebra, not elsewhere classified, thoracic region, subsequent encounter for fracture with routine healing: Secondary | ICD-10-CM | POA: Diagnosis present

## 2017-10-29 DIAGNOSIS — S199XXA Unspecified injury of neck, initial encounter: Secondary | ICD-10-CM | POA: Diagnosis not present

## 2017-10-29 DIAGNOSIS — M25562 Pain in left knee: Secondary | ICD-10-CM | POA: Diagnosis not present

## 2017-10-29 DIAGNOSIS — Z6832 Body mass index (BMI) 32.0-32.9, adult: Secondary | ICD-10-CM | POA: Diagnosis not present

## 2017-10-29 DIAGNOSIS — R402252 Coma scale, best verbal response, oriented, at arrival to emergency department: Secondary | ICD-10-CM | POA: Diagnosis present

## 2017-10-29 DIAGNOSIS — R402142 Coma scale, eyes open, spontaneous, at arrival to emergency department: Secondary | ICD-10-CM | POA: Diagnosis present

## 2017-10-29 DIAGNOSIS — S3993XA Unspecified injury of pelvis, initial encounter: Secondary | ICD-10-CM | POA: Diagnosis not present

## 2017-10-29 DIAGNOSIS — S2241XD Multiple fractures of ribs, right side, subsequent encounter for fracture with routine healing: Secondary | ICD-10-CM | POA: Diagnosis not present

## 2017-10-29 DIAGNOSIS — M4312 Spondylolisthesis, cervical region: Secondary | ICD-10-CM | POA: Diagnosis not present

## 2017-10-29 DIAGNOSIS — I517 Cardiomegaly: Secondary | ICD-10-CM | POA: Diagnosis not present

## 2017-10-29 DIAGNOSIS — S0990XA Unspecified injury of head, initial encounter: Secondary | ICD-10-CM | POA: Diagnosis not present

## 2017-10-30 DIAGNOSIS — E785 Hyperlipidemia, unspecified: Secondary | ICD-10-CM | POA: Diagnosis present

## 2017-10-30 DIAGNOSIS — S2231XA Fracture of one rib, right side, initial encounter for closed fracture: Secondary | ICD-10-CM | POA: Insufficient documentation

## 2017-10-30 DIAGNOSIS — S22070A Wedge compression fracture of T9-T10 vertebra, initial encounter for closed fracture: Secondary | ICD-10-CM | POA: Insufficient documentation

## 2017-10-30 DIAGNOSIS — I10 Essential (primary) hypertension: Secondary | ICD-10-CM | POA: Insufficient documentation

## 2017-10-30 DIAGNOSIS — I739 Peripheral vascular disease, unspecified: Secondary | ICD-10-CM | POA: Insufficient documentation

## 2017-10-30 DIAGNOSIS — N189 Chronic kidney disease, unspecified: Secondary | ICD-10-CM | POA: Insufficient documentation

## 2017-11-01 DIAGNOSIS — S23151D Dislocation of T8/T9 thoracic vertebra, subsequent encounter: Secondary | ICD-10-CM | POA: Diagnosis not present

## 2017-11-01 DIAGNOSIS — S2020XA Contusion of thorax, unspecified, initial encounter: Secondary | ICD-10-CM | POA: Diagnosis not present

## 2017-11-01 DIAGNOSIS — S80222A Blister (nonthermal), left knee, initial encounter: Secondary | ICD-10-CM | POA: Diagnosis not present

## 2017-11-01 DIAGNOSIS — I4891 Unspecified atrial fibrillation: Secondary | ICD-10-CM | POA: Diagnosis not present

## 2017-11-01 DIAGNOSIS — I2510 Atherosclerotic heart disease of native coronary artery without angina pectoris: Secondary | ICD-10-CM | POA: Diagnosis not present

## 2017-11-01 DIAGNOSIS — M25062 Hemarthrosis, left knee: Secondary | ICD-10-CM | POA: Diagnosis not present

## 2017-11-01 DIAGNOSIS — M4316 Spondylolisthesis, lumbar region: Secondary | ICD-10-CM | POA: Diagnosis not present

## 2017-11-01 DIAGNOSIS — S20219D Contusion of unspecified front wall of thorax, subsequent encounter: Secondary | ICD-10-CM | POA: Diagnosis not present

## 2017-11-01 DIAGNOSIS — S8002XD Contusion of left knee, subsequent encounter: Secondary | ICD-10-CM | POA: Diagnosis not present

## 2017-11-01 DIAGNOSIS — S60212D Contusion of left wrist, subsequent encounter: Secondary | ICD-10-CM | POA: Diagnosis not present

## 2017-11-01 DIAGNOSIS — I482 Chronic atrial fibrillation: Secondary | ICD-10-CM | POA: Diagnosis not present

## 2017-11-01 DIAGNOSIS — R278 Other lack of coordination: Secondary | ICD-10-CM | POA: Diagnosis not present

## 2017-11-01 DIAGNOSIS — M6281 Muscle weakness (generalized): Secondary | ICD-10-CM | POA: Diagnosis not present

## 2017-11-01 DIAGNOSIS — R262 Difficulty in walking, not elsewhere classified: Secondary | ICD-10-CM | POA: Diagnosis not present

## 2017-11-01 DIAGNOSIS — S2231XA Fracture of one rib, right side, initial encounter for closed fracture: Secondary | ICD-10-CM | POA: Diagnosis not present

## 2017-11-01 DIAGNOSIS — I5032 Chronic diastolic (congestive) heart failure: Secondary | ICD-10-CM | POA: Diagnosis not present

## 2017-11-01 DIAGNOSIS — S8001XD Contusion of right knee, subsequent encounter: Secondary | ICD-10-CM | POA: Diagnosis not present

## 2017-11-01 DIAGNOSIS — M4807 Spinal stenosis, lumbosacral region: Secondary | ICD-10-CM | POA: Diagnosis not present

## 2017-11-01 DIAGNOSIS — Z741 Need for assistance with personal care: Secondary | ICD-10-CM | POA: Diagnosis not present

## 2017-11-01 DIAGNOSIS — F39 Unspecified mood [affective] disorder: Secondary | ICD-10-CM | POA: Diagnosis not present

## 2017-11-01 DIAGNOSIS — S2241XD Multiple fractures of ribs, right side, subsequent encounter for fracture with routine healing: Secondary | ICD-10-CM | POA: Diagnosis not present

## 2017-11-01 MED ORDER — BACITRACIN 500 UNIT/GM EX OINT
TOPICAL_OINTMENT | CUTANEOUS | Status: DC
Start: 2017-11-01 — End: 2017-11-01

## 2017-11-01 MED ORDER — VERAPAMIL HCL ER 180 MG PO TBCR
180.00 mg | EXTENDED_RELEASE_TABLET | ORAL | Status: DC
Start: 2017-11-01 — End: 2017-11-01

## 2017-11-01 MED ORDER — SPIRONOLACTONE 25 MG PO TABS
25.00 mg | ORAL_TABLET | ORAL | Status: DC
Start: 2017-11-02 — End: 2017-11-01

## 2017-11-01 MED ORDER — ONDANSETRON 4 MG PO TBDP
4.00 mg | ORAL_TABLET | ORAL | Status: DC
Start: ? — End: 2017-11-01

## 2017-11-01 MED ORDER — ACETAMINOPHEN 325 MG PO TABS
650.00 mg | ORAL_TABLET | ORAL | Status: DC
Start: ? — End: 2017-11-01

## 2017-11-01 MED ORDER — POLYETHYLENE GLYCOL 3350 17 G PO PACK
17.00 | PACK | ORAL | Status: DC
Start: 2017-11-02 — End: 2017-11-01

## 2017-11-01 MED ORDER — APIXABAN 5 MG PO TABS
5.00 mg | ORAL_TABLET | ORAL | Status: DC
Start: 2017-11-01 — End: 2017-11-01

## 2017-11-01 MED ORDER — PANTOPRAZOLE SODIUM 40 MG PO TBEC
40.00 mg | DELAYED_RELEASE_TABLET | ORAL | Status: DC
Start: 2017-11-01 — End: 2017-11-01

## 2017-11-01 MED ORDER — GENERIC EXTERNAL MEDICATION
250.00 mg | Status: DC
Start: 2017-11-01 — End: 2017-11-01

## 2017-11-01 MED ORDER — GENERIC EXTERNAL MEDICATION
Status: DC
Start: ? — End: 2017-11-01

## 2017-11-01 MED ORDER — LISINOPRIL 10 MG PO TABS
10.00 mg | ORAL_TABLET | ORAL | Status: DC
Start: 2017-11-01 — End: 2017-11-01

## 2017-11-01 MED ORDER — AMITRIPTYLINE HCL 25 MG PO TABS
25.00 mg | ORAL_TABLET | ORAL | Status: DC
Start: 2017-11-01 — End: 2017-11-01

## 2017-11-01 MED ORDER — EZETIMIBE 10 MG PO TABS
10.00 mg | ORAL_TABLET | ORAL | Status: DC
Start: 2017-11-01 — End: 2017-11-01

## 2017-11-01 MED ORDER — LORATADINE 10 MG PO TABS
10.00 mg | ORAL_TABLET | ORAL | Status: DC
Start: 2017-11-02 — End: 2017-11-01

## 2017-11-01 MED ORDER — DOCUSATE SODIUM 100 MG PO CAPS
100.00 mg | ORAL_CAPSULE | ORAL | Status: DC
Start: 2017-11-01 — End: 2017-11-01

## 2017-11-04 DIAGNOSIS — I2510 Atherosclerotic heart disease of native coronary artery without angina pectoris: Secondary | ICD-10-CM | POA: Diagnosis not present

## 2017-11-04 DIAGNOSIS — I482 Chronic atrial fibrillation: Secondary | ICD-10-CM | POA: Diagnosis not present

## 2017-11-04 DIAGNOSIS — M4316 Spondylolisthesis, lumbar region: Secondary | ICD-10-CM | POA: Diagnosis not present

## 2017-11-04 DIAGNOSIS — I5032 Chronic diastolic (congestive) heart failure: Secondary | ICD-10-CM | POA: Diagnosis not present

## 2017-11-04 DIAGNOSIS — F39 Unspecified mood [affective] disorder: Secondary | ICD-10-CM | POA: Diagnosis not present

## 2017-11-04 DIAGNOSIS — M4807 Spinal stenosis, lumbosacral region: Secondary | ICD-10-CM | POA: Diagnosis not present

## 2017-11-07 DIAGNOSIS — S80222A Blister (nonthermal), left knee, initial encounter: Secondary | ICD-10-CM

## 2017-11-19 DIAGNOSIS — M6281 Muscle weakness (generalized): Secondary | ICD-10-CM | POA: Diagnosis not present

## 2017-11-19 DIAGNOSIS — R278 Other lack of coordination: Secondary | ICD-10-CM | POA: Diagnosis not present

## 2017-11-19 DIAGNOSIS — Z741 Need for assistance with personal care: Secondary | ICD-10-CM | POA: Diagnosis not present

## 2017-11-20 DIAGNOSIS — Z741 Need for assistance with personal care: Secondary | ICD-10-CM | POA: Diagnosis not present

## 2017-11-20 DIAGNOSIS — R278 Other lack of coordination: Secondary | ICD-10-CM | POA: Diagnosis not present

## 2017-11-20 DIAGNOSIS — M6281 Muscle weakness (generalized): Secondary | ICD-10-CM | POA: Diagnosis not present

## 2017-11-21 DIAGNOSIS — M6281 Muscle weakness (generalized): Secondary | ICD-10-CM | POA: Diagnosis not present

## 2017-11-21 DIAGNOSIS — Z741 Need for assistance with personal care: Secondary | ICD-10-CM | POA: Diagnosis not present

## 2017-11-21 DIAGNOSIS — R278 Other lack of coordination: Secondary | ICD-10-CM | POA: Diagnosis not present

## 2017-11-22 DIAGNOSIS — R278 Other lack of coordination: Secondary | ICD-10-CM | POA: Diagnosis not present

## 2017-11-22 DIAGNOSIS — Z741 Need for assistance with personal care: Secondary | ICD-10-CM | POA: Diagnosis not present

## 2017-11-22 DIAGNOSIS — M6281 Muscle weakness (generalized): Secondary | ICD-10-CM | POA: Diagnosis not present

## 2017-11-25 DIAGNOSIS — Z6834 Body mass index (BMI) 34.0-34.9, adult: Secondary | ICD-10-CM | POA: Diagnosis not present

## 2017-11-25 DIAGNOSIS — S13150D Subluxation of C4/C5 cervical vertebrae, subsequent encounter: Secondary | ICD-10-CM | POA: Diagnosis not present

## 2017-11-26 DIAGNOSIS — Z741 Need for assistance with personal care: Secondary | ICD-10-CM | POA: Diagnosis not present

## 2017-11-26 DIAGNOSIS — R278 Other lack of coordination: Secondary | ICD-10-CM | POA: Diagnosis not present

## 2017-11-26 DIAGNOSIS — M6281 Muscle weakness (generalized): Secondary | ICD-10-CM | POA: Diagnosis not present

## 2017-11-27 DIAGNOSIS — Z741 Need for assistance with personal care: Secondary | ICD-10-CM | POA: Diagnosis not present

## 2017-11-27 DIAGNOSIS — M6281 Muscle weakness (generalized): Secondary | ICD-10-CM | POA: Diagnosis not present

## 2017-11-27 DIAGNOSIS — R278 Other lack of coordination: Secondary | ICD-10-CM | POA: Diagnosis not present

## 2017-11-29 DIAGNOSIS — M6281 Muscle weakness (generalized): Secondary | ICD-10-CM | POA: Diagnosis not present

## 2017-11-29 DIAGNOSIS — Z741 Need for assistance with personal care: Secondary | ICD-10-CM | POA: Diagnosis not present

## 2017-11-29 DIAGNOSIS — R278 Other lack of coordination: Secondary | ICD-10-CM | POA: Diagnosis not present

## 2017-12-03 DIAGNOSIS — M6281 Muscle weakness (generalized): Secondary | ICD-10-CM | POA: Diagnosis not present

## 2017-12-03 DIAGNOSIS — R278 Other lack of coordination: Secondary | ICD-10-CM | POA: Diagnosis not present

## 2017-12-03 DIAGNOSIS — Z741 Need for assistance with personal care: Secondary | ICD-10-CM | POA: Diagnosis not present

## 2017-12-04 ENCOUNTER — Telehealth: Payer: Self-pay | Admitting: Internal Medicine

## 2017-12-04 DIAGNOSIS — R278 Other lack of coordination: Secondary | ICD-10-CM | POA: Diagnosis not present

## 2017-12-04 DIAGNOSIS — Z741 Need for assistance with personal care: Secondary | ICD-10-CM | POA: Diagnosis not present

## 2017-12-04 DIAGNOSIS — M6281 Muscle weakness (generalized): Secondary | ICD-10-CM | POA: Diagnosis not present

## 2017-12-04 NOTE — Telephone Encounter (Signed)
Copied from [place] [id number]. Topic: Quick Communication - Rx Refill/Question >> Dec 04, 2017  4:21 PM Burnis Medin, NT wrote: Medication: traZODone (DESYREL) 150 MG tablet   Has the patient contacted their pharmacy? Yes    (Agent: If no, request that the patient contact the pharmacy for the refill.)  [facility] [address], [place] [phone] (Phone) [phone] (Fax)   Preferred Pharmacy (with phone number or street name):    Agent: Please be advised that RX refills may take up to 3 business days. We ask that you follow-up with your pharmacy.

## 2017-12-04 NOTE — Telephone Encounter (Signed)
Copied from [place]. Topic: Quick Communication - Rx Refill/Question >> Dec 04, 2017  4:20 PM Boyd Kerbs wrote: Medication:   traZODone (DESYREL) 150 MG tablet  She is needing this asap sent high priority  Has the patient contacted their pharmacy? Yes.   [facility] says they sent escript [facility] days ago and sent several times  (Agent: If no, request that the patient contact the pharmacy for the refill.)   Preferred Pharmacy (with phone number or street name): [id number] - [place] [address], [place] [address] [place] [address] Phone: [phone] Fax: [phone]     Agent: Please be advised that RX refills may take up to 3 business days. We ask that you follow-up with your pharmacy.

## 2017-12-05 DIAGNOSIS — R278 Other lack of coordination: Secondary | ICD-10-CM | POA: Diagnosis not present

## 2017-12-05 DIAGNOSIS — M6281 Muscle weakness (generalized): Secondary | ICD-10-CM | POA: Diagnosis not present

## 2017-12-05 DIAGNOSIS — Z741 Need for assistance with personal care: Secondary | ICD-10-CM | POA: Diagnosis not present

## 2017-12-05 MED ORDER — TRAZODONE HCL 150 MG PO TABS: ORAL_TABLET | ORAL | 2 refills | Status: DC

## 2017-12-05 NOTE — Telephone Encounter (Signed)
LOV 09/09/17 with Dr. Nicki Reaper / Refill request for Trazadone / Refilled per protocol  /

## 2017-12-05 NOTE — Telephone Encounter (Signed)
Called pharmacy and med is ready to be picked up- called pt and informed her

## 2017-12-06 DIAGNOSIS — M6281 Muscle weakness (generalized): Secondary | ICD-10-CM | POA: Diagnosis not present

## 2017-12-06 DIAGNOSIS — Z741 Need for assistance with personal care: Secondary | ICD-10-CM | POA: Diagnosis not present

## 2017-12-06 DIAGNOSIS — R278 Other lack of coordination: Secondary | ICD-10-CM | POA: Diagnosis not present

## 2017-12-10 DIAGNOSIS — M6281 Muscle weakness (generalized): Secondary | ICD-10-CM | POA: Diagnosis not present

## 2017-12-10 DIAGNOSIS — Z741 Need for assistance with personal care: Secondary | ICD-10-CM | POA: Diagnosis not present

## 2017-12-10 DIAGNOSIS — R278 Other lack of coordination: Secondary | ICD-10-CM | POA: Diagnosis not present

## 2017-12-12 ENCOUNTER — Ambulatory Visit (INDEPENDENT_AMBULATORY_CARE_PROVIDER_SITE_OTHER): Payer: Medicare Other | Admitting: Internal Medicine

## 2017-12-12 VITALS — BP 128/74 | HR 88 | Temp 98.2°F | Resp 18 | Wt 191.4 lb

## 2017-12-12 DIAGNOSIS — E7800 Pure hypercholesterolemia, unspecified: Secondary | ICD-10-CM

## 2017-12-12 DIAGNOSIS — F329 Major depressive disorder, single episode, unspecified: Secondary | ICD-10-CM

## 2017-12-12 DIAGNOSIS — I10 Essential (primary) hypertension: Secondary | ICD-10-CM

## 2017-12-12 DIAGNOSIS — I5032 Chronic diastolic (congestive) heart failure: Secondary | ICD-10-CM | POA: Diagnosis not present

## 2017-12-12 DIAGNOSIS — R739 Hyperglycemia, unspecified: Secondary | ICD-10-CM | POA: Diagnosis not present

## 2017-12-12 DIAGNOSIS — I481 Persistent atrial fibrillation: Secondary | ICD-10-CM | POA: Diagnosis not present

## 2017-12-12 DIAGNOSIS — I25118 Atherosclerotic heart disease of native coronary artery with other forms of angina pectoris: Secondary | ICD-10-CM | POA: Diagnosis not present

## 2017-12-12 DIAGNOSIS — I4819 Other persistent atrial fibrillation: Secondary | ICD-10-CM

## 2017-12-12 DIAGNOSIS — F32A Depression, unspecified: Secondary | ICD-10-CM
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
## 2017-12-15 ENCOUNTER — Encounter: Payer: Self-pay | Admitting: Internal Medicine

## 2017-12-15 NOTE — Assessment & Plan Note (Signed)
No evidence of volume overload on exam.  Continue current medication regimen.  Followed by cardiology.

## 2017-12-15 NOTE — Assessment & Plan Note (Signed)
On zetia.  Follow lipid panel.  
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
## 2017-12-15 NOTE — Assessment & Plan Note (Signed)
Blood pressure under good control.  Continue same medication regimen.  Follow pressures.  Follow metabolic panel.   

## 2017-12-15 NOTE — Assessment & Plan Note (Signed)
Stable.  Follow.   

## 2017-12-15 NOTE — Assessment & Plan Note (Signed)
On eliquis.  Rate controlled.  Followed by Dr Rockey Situ.

## 2017-12-15 NOTE — Assessment & Plan Note (Signed)
S/P DES - SVG to RCA 05/2016.  Followed by cardiology.  Off plavix.  On eliquis.  Currently stable.

## 2017-12-23 ENCOUNTER — Telehealth: Payer: Self-pay | Admitting: Internal Medicine
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
## 2017-12-23 NOTE — Telephone Encounter (Signed)
See note.  Dr Rockey Situ reviewed EKG - agreed no change.

## 2017-12-30 ENCOUNTER — Telehealth: Payer: Self-pay

## 2017-12-30 NOTE — Telephone Encounter (Signed)
Patient is aware of EKG having no change,

## 2017-12-30 NOTE — Telephone Encounter (Signed)
-----   Message from [name], MD sent at 12/23/2017  5:00 AM EDT ----- Regarding: EKG Please call and notify pt that Dr Rockey Situ did review her EKG and he agreed EKG unchanged.      Dr Nicki Reaper

## 2017-12-31 ENCOUNTER — Telehealth: Payer: Self-pay | Admitting: Internal Medicine

## 2018-01-02 NOTE — Telephone Encounter (Signed)
shane from [facility] calling to confirm we got this med refill request.  ramipril (ALTACE) 10 MG capsule   [id number] - [place], [place] - [address] [phone] (Phone) [phone] (Fax)

## 2018-01-09 ENCOUNTER — Ambulatory Visit: Payer: Medicare Other | Admitting: Internal Medicine

## 2018-01-13 ENCOUNTER — Telehealth: Payer: Self-pay | Admitting: Internal Medicine

## 2018-01-13 ENCOUNTER — Ambulatory Visit: Payer: Self-pay | Admitting: *Deleted

## 2018-01-13 DIAGNOSIS — R0602 Shortness of breath: Secondary | ICD-10-CM

## 2018-01-13 MED ORDER — FOLIC ACID 1 MG PO TABS: 1.0000 mg | ORAL_TABLET | Freq: Every day | ORAL | 5 refills | Status: DC

## 2018-01-13 NOTE — Telephone Encounter (Signed)
Patient advised By nurse  She needs to go to ED patient refuses and refused appt at 11:30 today with Dr. Derrel Nip. Scheduled patient with DR. Tullo for tomorrow at 3. Advised patient again she should be evaluated sooner at ED patient refused, but said if symptoms worsen she will call EMS.

## 2018-01-13 NOTE — Telephone Encounter (Signed)
Folic Acid refilled.

## 2018-01-13 NOTE — Telephone Encounter (Signed)
Copied from [place]. Topic: HIPAA Complaint >> Jan 13, 2018 10:25 AM Oneta Rack wrote: Relation to pt: self Call back number: [phone] Pharmacy: [facility] [place], [place] [phone] (Phone) [phone] (Fax)   Reason for call:  Patient requesting folic acid (FOLVITE) 1 MG tablet, patient states she contacted pharmacy last week, patient states she has 1 pill left, patient aware please allow 48 to 72 hour turn around, please advise  >> Jan 13, 2018 10:28 AM Oneta Rack wrote: Relation to pt: self Call back number: [phone] Pharmacy: [facility] [place], [place] [phone] (Phone) [phone] (Fax)   Reason for call:  Patient requesting folic acid (FOLVITE) 1 MG tablet, patient states she contacted pharmacy last week, patient states she has 1 pill left, patient aware please allow 48 to 72 hour turn around, please advise

## 2018-01-13 NOTE — Telephone Encounter (Signed)
Pt  Has rectal bleeding  X  sev weeks has bleeding - has  Been applying  To the  Area . Pt  Also  Reports  Stable  Shortness  Of  Breath   . She  Is  Taking  eliquis  Has    AFIB .  Pt   Denies  Any  Chest  Pain .  She  Has  Had  The  Shortness  Of  Breath  Since  January. At this  Time  Pt  Is   Alert  And  Oriented. Spoke  With  [place]  Who spoke with Dr Derrel Nip   - DrTullo   Will  See  tomorrow  At  3 pm  Pt advised  If  increase  In bleeding or any  Shortness  Of breath to  [facility]    Reason for Disposition . Taking Coumadin (warfarin) or other strong blood thinner, or known bleeding disorder (e.g., thrombocytopenia)  Answer Assessment - Initial Assessment Questions 1. APPEARANCE of BLOOD: "What color is it?" "Is it passed separately, on the surface of the stool, or mixed in with the stool?"         Bright  Red  2. AMOUNT: "How much blood was passed?"          30  Ml    Twice  Day for  2-3  Weeks   3. FREQUENCY: "How many times has blood been passed with the stools?"             Each time  Has  bm   4. ONSET: "When was the blood first seen in the stools?" (Days or weeks)          2-3  Weeks    5. DIARRHEA: "Is there also some diarrhea?" If so, ask: "How many diarrhea stools were passed in past 24 hours?"          No  Diarrhea  Recently   6. CONSTIPATION: "Do you have constipation?" If so, "How bad is it?"           No  7. RECURRENT SYMPTOMS: "Have you had blood in your stools before?" If so, ask: "When was the last time?" and "What happened that time?"        Has had  Off and  On   For  Several  Years  Worse  Now   8. BLOOD THINNERS: "Do you take any blood thinners?" (e.g., Coumadin/warfarin, Pradaxa/dabigatran, aspirin)     Eliquis  9. OTHER SYMPTOMS: "Do you have any other symptoms?"  (e.g., abdominal pain, vomiting, dizziness, fever)       No   10. PREGNANCY: "Is there any chance you are pregnant?" "When was your last menstrual period?"      N/A  Protocols  used: RECTAL BLEEDING-A-AH

## 2018-01-13 NOTE — Addendum Note (Signed)
Addended by: Crecencio Mc on: 01/13/2018 11:49 AM   Modules accepted: Orders

## 2018-01-13 NOTE — Telephone Encounter (Signed)
Pt requesting refill of Folic Acid (Folvite) 1mg .   LOV:12/12/17 Next OV 4/9 with Dr.Tullo   PCP: Dr. Nicki Reaper  [facility]    [address]    [place]

## 2018-01-13 NOTE — Telephone Encounter (Signed)
She has a cbc ordered along with other labs ,  They should be drawn tomorrow morning .  The cBc is being reordered as a STAT

## 2018-01-14 ENCOUNTER — Other Ambulatory Visit (INDEPENDENT_AMBULATORY_CARE_PROVIDER_SITE_OTHER): Payer: Medicare Other

## 2018-01-14 ENCOUNTER — Encounter: Payer: Self-pay | Admitting: Internal Medicine

## 2018-01-14 ENCOUNTER — Ambulatory Visit (INDEPENDENT_AMBULATORY_CARE_PROVIDER_SITE_OTHER): Payer: Medicare Other | Admitting: Internal Medicine

## 2018-01-14 ENCOUNTER — Other Ambulatory Visit: Payer: Self-pay

## 2018-01-14 VITALS — BP 134/82 | HR 67 | Temp 97.7°F | Wt 187.2 lb

## 2018-01-14 DIAGNOSIS — K649 Unspecified hemorrhoids: Secondary | ICD-10-CM

## 2018-01-14 DIAGNOSIS — I25118 Atherosclerotic heart disease of native coronary artery with other forms of angina pectoris: Secondary | ICD-10-CM | POA: Diagnosis not present

## 2018-01-14 DIAGNOSIS — R0602 Shortness of breath: Secondary | ICD-10-CM | POA: Diagnosis not present

## 2018-01-14 DIAGNOSIS — R739 Hyperglycemia, unspecified: Secondary | ICD-10-CM

## 2018-01-14 DIAGNOSIS — E7800 Pure hypercholesterolemia, unspecified: Secondary | ICD-10-CM | POA: Diagnosis not present

## 2018-01-14 DIAGNOSIS — I10 Essential (primary) hypertension: Secondary | ICD-10-CM

## 2018-01-14 DIAGNOSIS — R0600 Dyspnea, unspecified: Secondary | ICD-10-CM

## 2018-01-14 DIAGNOSIS — K644 Residual hemorrhoidal skin tags: Secondary | ICD-10-CM | POA: Diagnosis not present

## 2018-01-14 LAB — CBC WITH DIFFERENTIAL/PLATELET
Basophils Absolute: 0 10*3/uL (ref 0.0–0.1)
Basophils Relative: 0.5 % (ref 0.0–3.0)
EOS PCT: 1.9 % (ref 0.0–5.0)
Eosinophils Absolute: 0.1 10*3/uL (ref 0.0–0.7)
HCT: 35.7 % — ABNORMAL LOW (ref 36.0–46.0)
Hemoglobin: 11.9 g/dL — ABNORMAL LOW (ref 12.0–15.0)
LYMPHS ABS: 0.7 10*3/uL (ref 0.7–4.0)
Lymphocytes Relative: 23.4 % (ref 12.0–46.0)
MCHC: 33.4 g/dL (ref 30.0–36.0)
MCV: 92.8 fl (ref 78.0–100.0)
MONO ABS: 0.5 10*3/uL (ref 0.1–1.0)
Monocytes Relative: 15.8 % — ABNORMAL HIGH (ref 3.0–12.0)
NEUTROS PCT: 58.4 % (ref 43.0–77.0)
Neutro Abs: 1.7 10*3/uL (ref 1.4–7.7)
PLATELETS: 224 10*3/uL (ref 150.0–400.0)
RBC: 3.85 Mil/uL — AB (ref 3.87–5.11)
RDW: 14.7 % (ref 11.5–15.5)
WBC: 2.9 10*3/uL — ABNORMAL LOW (ref 4.0–10.5)

## 2018-01-14 LAB — HEPATIC FUNCTION PANEL
ALT: 17 U/L (ref 0–35)
AST: 21 U/L (ref 0–37)
Albumin: 4.2 g/dL (ref 3.5–5.2)
Alkaline Phosphatase: 52 U/L (ref 39–117)
Bilirubin, Direct: 0.1 mg/dL (ref 0.0–0.3)
Total Bilirubin: 0.6 mg/dL (ref 0.2–1.2)
Total Protein: 6.7 g/dL (ref 6.0–8.3)

## 2018-01-14 LAB — BASIC METABOLIC PANEL
BUN: 13 mg/dL (ref 6–23)
CO2: 28 mEq/L (ref 19–32)
Calcium: 9.7 mg/dL (ref 8.4–10.5)
Chloride: 102 mEq/L (ref 96–112)
Creatinine, Ser: 0.83 mg/dL (ref 0.40–1.20)
GFR: 70.34 mL/min (ref >60.00)
Glucose, Bld: 104 mg/dL — ABNORMAL HIGH (ref 70–99)
Potassium: 4.3 mEq/L (ref 3.5–5.1)
Sodium: 136 mEq/L (ref 135–145)

## 2018-01-14 LAB — LIPID PANEL
CHOLESTEROL: 189 mg/dL (ref 0–200)
HDL: 62.3 mg/dL (ref >39.00)
LDL Cholesterol: 112 mg/dL — ABNORMAL HIGH (ref 0–99)
NonHDL: 127.09
TRIGLYCERIDES: 77 mg/dL (ref 0.0–149.0)
Total CHOL/HDL Ratio: 3
VLDL: 15.4 mg/dL (ref 0.0–40.0)

## 2018-01-14 LAB — HEMOGLOBIN A1C: Hgb A1c MFr Bld: 5.8 % (ref 4.6–6.5)

## 2018-01-14 NOTE — Patient Instructions (Signed)
I am referring you to Dr Marius Ditch at [facility] for definitive treatment of your bleeding hemorrhoid.  If the procedure requires suspension of your Eliquis ,  We can "bridge " you with Lovenox (twice daily sub Q injections)  prior to the procedure and for a day or two afterward to  minimize your risks of stroke
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
## 2018-01-15 ENCOUNTER — Telehealth: Payer: Self-pay

## 2018-01-15 NOTE — Telephone Encounter (Signed)
Patient was contacted by staff to schedule her colonoscopy.  During triage she expressed that she would like to be seen today as to she said she has been bleeding for the past 3 days.  I asked how much bleeding she was experiencing and she said its filling up the toilet and it is bright red in color and concerned her hgb may have dropped due to the bleeding.  I advised her to go to ER right away, explaining to her that even though she states her bleeding is coming from the hemorrhoids-I can not make this determination and she needs to have this evaluated at the ER.  She then states that she is very disappointed in our office.  I then replied that I'm sorry you are disappointed in our office but I'm advising you to go to the ER because this is what's best for you at this time because you are actively bleeding.  I informed her that we would love the opportunity to take care of her and after going to the ER if she would like to call us back to schedule her colonoscopy or an office visit-we would gladly see her. She said she would not go to the ER because she has an extremely busy day today.  Thanks for the referral.  Sharyn Lull CMA

## 2018-01-15 NOTE — Assessment & Plan Note (Signed)
With persistent bleeding for the last 2 weeks due to use of Eliquis for embolic stroke risk mitigation.  She is requesting definitive therapy . Referral to Dr Marius Ditch for treatment.  Slight drop in hgb noted .  Discussed bridging with lovenox if eliquis needs to be stopped for procedure.

## 2018-01-15 NOTE — Telephone Encounter (Signed)
I don't understand.  I saw the patient yesterday for this problem and identified that she had a bleeding hemorrhoid, so the referral is to see Dr Marius Ditch for hemorrhoid managmenet.  Her hemoglobin HAD NOT DROPPED , I checked it yesterday, so why was she told to go to ER?

## 2018-01-15 NOTE — Telephone Encounter (Signed)
Will hold message to call back after 9 AM tomorrow.

## 2018-01-15 NOTE — Assessment & Plan Note (Signed)
Chronic complaint with prior pulmonary workup deferred by patient.  Will defer to PCP

## 2018-01-15 NOTE — Telephone Encounter (Signed)
Per our conversation earlier, you spoke to pt and she had called GI and was also told by them to go to ER.  Reviewed notes in chart.  Saw Dr Derrel Nip yesterday.  She felt bleeding was due to hemorrhoid.  Just need to f/u with pt and see how she is doing.  Dr Derrel Nip has placed order for GI referral.

## 2018-01-15 NOTE — Telephone Encounter (Signed)
Dr. Delene Loll called back and is asking for a return call either by 430pm or after 9am tomorrow.

## 2018-01-15 NOTE — Telephone Encounter (Signed)
LMTCB

## 2018-01-16 ENCOUNTER — Telehealth: Payer: Self-pay

## 2018-01-16 ENCOUNTER — Other Ambulatory Visit: Payer: Self-pay | Admitting: Internal Medicine

## 2018-01-16 DIAGNOSIS — K649 Unspecified hemorrhoids: Secondary | ICD-10-CM

## 2018-01-16 NOTE — Telephone Encounter (Signed)
Referring to Dr Bary Castilla.  Urgent referral placed.

## 2018-01-16 NOTE — Telephone Encounter (Signed)
Copied from [place] [id number]. Topic: Referral - Request >> Jan 16, 2018  3:49 PM Scherrie Gerlach wrote: Reason for CRM: pt states Dr Marius Ditch will not see her because her hemorrhoids are bleeding. Pt is wanting a referral to a GI or a proctologist.  One local in [place]. Pt states it has slowed down bleeding, but pt prefers to proceed asap to take care of her issues

## 2018-01-16 NOTE — Addendum Note (Signed)
Addended by: Crecencio Mc on: 01/16/2018 04:59 PM   Modules accepted: Orders

## 2018-01-16 NOTE — Telephone Encounter (Signed)
Spoke with patient. Patient stated that she would like to see Dr. Gustavo Lah at [facility] or anyone at his office first available for colonoscopy. Patient stated DR. Vanga's office would not make her an appt with her bleeding. Please change referral to [facility]. Thanks!!

## 2018-01-20 ENCOUNTER — Ambulatory Visit (INDEPENDENT_AMBULATORY_CARE_PROVIDER_SITE_OTHER): Payer: Medicare Other | Admitting: General Surgery

## 2018-01-20 ENCOUNTER — Encounter: Payer: Self-pay | Admitting: General Surgery

## 2018-01-20 VITALS — BP 124/78 | HR 72 | Resp 14 | Ht 60.0 in | Wt 190.0 lb

## 2018-01-20 DIAGNOSIS — I25118 Atherosclerotic heart disease of native coronary artery with other forms of angina pectoris: Secondary | ICD-10-CM | POA: Diagnosis not present

## 2018-01-20 DIAGNOSIS — K648 Other hemorrhoids: Secondary | ICD-10-CM | POA: Diagnosis not present

## 2018-01-20 NOTE — Progress Notes (Signed)
Patient ID: Charlotte Wagner, Dr., female   DOB: [birth year], 82 y.o.   MRN: [id number]  Chief Complaint  Patient presents with  . 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 Patient states she has been bleeding about years off and on. It is bright red blood and she noticed it on the paper and in the bowl. Patient states she always has been constipated, use Mirlax daily. Last colonoscopy 2017.  Lives at [facility]. 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 HPI  Past Medical History:  Diagnosis Date  . Allergy   . Arthritis    s/p bilateral knees and left hip replacement  . Asthma   . CAD (coronary artery disease)    a. 12/1996 s/p CABG x4 ([place]);  b. 2005 Pt reports stress test & cath, which revealed patent grafts.  . Cancer ([place])    melanoma right arm  . Carotid arterial disease ([place])    a. 04/2015 Carotid U/S: <50% bilat ICA stenosis.  . Chronic kidney disease   . Colon polyps    H/O  . Depression   . GERD (gastroesophageal reflux disease)    h/o hiatal hernia  . Headache    migraines in past  . Heart murmur    a. 04/2011 Echo: EF 55-60%, bilat atrial enlargement, mild to mod TR.  [name] History of chicken pox   . History of hiatal hernia   . Hx of migraines    rare now  . Hx: UTI (urinary tract infection)   . Hyperlipidemia    a. Statin intolerant -->on zetia.  . Hypertensive heart disease   . Lichen planus   . Melanoma ([place])   . Palpitations    a. rare PVC's and h/o SVT.  [name] PMR (polymyalgia rheumatica) ([facility])    h/o in setting of crestor usage.  [name] PSVT (paroxysmal supraventricular tachycardia) ([place])   . PUD (peptic ulcer disease)    remote history  . Raynaud's phenomenon   . Spinal stenosis   . Urine incontinence    H/O  . Vitamin D deficiency     Past Surgical History:  Procedure Laterality Date  . ADENOIDECTOMY     age 49  . BACK SURGERY     L3-L5  . BILATERAL CARPAL TUNNEL RELEASE    . BREAST  BIOPSY Right    bx x 3 neg  . BREAST SURGERY Right    biopsy x 3 (all benign)  . CARDIAC CATHETERIZATION N/A 06/01/2016   Procedure: LEFT HEART CATH AND CORS/GRAFTS ANGIOGRAPHY;  Surgeon: Wellington Hampshire, MD;  Location: [place] [facility];  Service: Cardiovascular;  Laterality: N/A;  . CARDIAC CATHETERIZATION N/A 06/01/2016   Procedure: Coronary Stent Intervention;  Surgeon: Wellington Hampshire, MD;  Location: [place] [facility];  Service: Cardiovascular;  Laterality: N/A;  . CARDIOVERSION N/A 03/08/2017   Procedure: CARDIOVERSION;  Surgeon: Wellington Hampshire, MD;  Location: [facility] ORS;  Service: Cardiovascular;  Laterality: N/A;  . CATARACT EXTRACTION W/PHACO Right 01/04/2016   Procedure: CATARACT EXTRACTION PHACO AND INTRAOCULAR LENS PLACEMENT ([place]);  Surgeon: Leandrew Koyanagi, MD;  Location: [place];  Service: Ophthalmology;  Laterality: Right;  MALYUGIN  . CATARACT EXTRACTION W/PHACO Left 01/25/2016   Procedure: CATARACT EXTRACTION PHACO AND INTRAOCULAR LENS PLACEMENT ([place]) left eye;  Surgeon: Leandrew Koyanagi, MD;  Location: [place];  Service: Ophthalmology;  Laterality: Left;  MALYUGIN SHUGARCAINE  . CHOLECYSTECTOMY  90's  . COLONOSCOPY WITH PROPOFOL N/A 05/03/2015   Procedure: COLONOSCOPY WITH PROPOFOL;  Surgeon: Lollie Sails, MD;  Location: [facility] ENDOSCOPY;  Service: Endoscopy;  Laterality: N/A;  . CORONARY ARTERY BYPASS GRAFT  98   4 vessel  . ESOPHAGOGASTRODUODENOSCOPY N/A 03/22/2015   Procedure: ESOPHAGOGASTRODUODENOSCOPY (EGD);  Surgeon: Lollie Sails, MD;  Location: [facility] ENDOSCOPY;  Service: Endoscopy;  Laterality: N/A;  . JOINT REPLACEMENT     BILATERAL KNEE REPLACEMENTS  . KNEE ARTHROSCOPY W/ OATS PROCEDURE     Lt knee (9/01), Rt knee (3/11), Lt hip (5/10)  . TOTAL HIP ARTHROPLASTY      Family History  Problem Relation Age of Onset  . Thyroid disease Mother        graves disease  . Heart disease Father        rheumatic heart  . Alcohol  abuse Father   . Depression Father   . Lung cancer Sister   . Depression Daughter   . Thyroid disease Daughter        hashimoto  . Depression Son   . Stroke Maternal Grandmother   . Depression Maternal Grandmother   . Hypertension Maternal Grandfather   . Hypertension Paternal Grandfather   . Seizures Son   . Breast cancer Neg Hx     Social History Social History   Tobacco Use  . Smoking status: Former Research scientist (life sciences)  . Smokeless tobacco: Never Used  . Tobacco comment: quit 44+ yrs ago  Substance Use Topics  . Alcohol use: No    Alcohol/week: 0.0 oz  . Drug use: No    Allergies  Allergen Reactions  . Beta Adrenergic Blockers Shortness Of Breath    DEPRESSION/HYPOTENSION  . Brilinta [Ticagrelor] Shortness Of Breath    Caused AFIB  . Albuterol     rigors  . Aspirin Other (See Comments)    Heartburn   . Nsaids Other (See Comments)    ULCER HISTORY & CURRENTLY ON BLOOD THINNERS  . Penicillins Hives and Other (See Comments)    Has patient had a PCN reaction causing immediate rash, facial/tongue/throat swelling, SOB or lightheadedness with hypotension: No Has patient had a PCN reaction causing severe rash involving mucus membranes or skin necrosis: No Has patient had a PCN reaction that required hospitalization No Has patient had a PCN reaction occurring within the last 10 years: No If all of the above answers are "NO", then may proceed with Cephalosporin use.   . Statins Other (See Comments)    Muscle weakness  . Demeclocycline Rash  . Sulfa Antibiotics Rash    At mouth  . Sulfasalazine Rash    At mouth  . Tetracycline Rash  . Tetracyclines & Related Rash    Current Outpatient Medications  Medication Sig Dispense Refill  . acetaminophen (TYLENOL) 500 MG tablet Take 1,000 mg by mouth 2 (two) times daily.     [name] ADVAIR DISKUS 250-50 MCG/DOSE AEPB inhale 1 puff INTO THE LUNGS twice a day 60 each 1  . amitriptyline (ELAVIL) 25 MG tablet take 1 to 2 tablets by mouth at  bedtime 60 tablet 5  . apixaban (ELIQUIS) 5 MG TABS tablet Take 1 tablet (5 mg total) by mouth 2 (two) times daily. 60 tablet 3  . ATROVENT HFA 17 MCG/ACT inhaler inhale 2 puffs INTO THE LUNGS every 6 hours if needed for wheezing 12.9 g 2  . b complex vitamins tablet Take 1 tablet by mouth daily.    . Cholecalciferol (VITAMIN D3) 2000  UNITS TABS Take 2,000 Units by mouth daily.     . Coenzyme Q10 (COQ-10) 100 MG CAPS Take 100 mg by mouth daily.     [name] ezetimibe (ZETIA) 10 MG tablet take 1 tablet by mouth once daily 30 tablet 7  . folic acid (FOLVITE) 1 MG tablet Take 1 tablet (1 mg total) by mouth daily. 30 tablet 5  . furosemide (LASIX) 20 MG tablet Take 0.5 tablets (10 mg total) by mouth daily. (Patient taking differently: Take 20 mg by mouth daily as needed (for weight gain of 2 lbs or more). ) 15 tablet 3  . hydrocortisone 2.5 % cream apply to VULVA twice a day AT 10AM AND 4 PM (Patient taking differently: apply to VULVA at bedtime as needed for with miconazole for lichen planus) 28 g 0  . ketoconazole (NIZORAL) 2 % cream Apply 1 application topically daily. (Patient taking differently: Apply 1 application topically daily as needed (for crack bleeding fingers.). ) 30 g 0  . loratadine (CLARITIN) 10 MG tablet Take 10 mg by mouth daily.    . meclizine (ANTIVERT) 25 MG tablet Take 25 mg by mouth 3 (three) times daily as needed for dizziness.    . miconazole (MICOTIN) 2 % cream Apply 1 application topically at bedtime as needed (with hydrocortisone for lichen planus).     . Multiple Vitamins-Minerals (HAIR/SKIN/NAILS PO) Take 1 tablet by mouth daily.    . nitroGLYCERIN (NITROSTAT) 0.4 MG SL tablet Place 1 tablet (0.4 mg total) under the tongue every 5 (five) minutes as needed for chest pain. Place 1 tablet (0.4 mg total) under the tongue every 5 (five) minutes , 3 times as needed for chest pain. 15 tablet 0  . Omega-3 Fatty Acids (FISH OIL PO) Take 2,000 mg by mouth daily.    . pantoprazole  (PROTONIX) 40 MG tablet take 1 tablet by mouth twice a day 60 tablet 5  . polyethylene glycol powder (GLYCOLAX/MIRALAX) powder Take 8.5 g by mouth once.    . ramipril (ALTACE) 10 MG capsule TAKE 1 CAPSULE BY MOUTH ONCE DAILY 30 capsule 3  . spironolactone (ALDACTONE) 25 MG tablet take 1 tablet by mouth every morning 30 tablet 5  . traMADol (ULTRAM) 50 MG tablet Take 1 tablet (50 mg total) by mouth daily as needed. 20 tablet 0  . verapamil (CALAN-SR) 180 MG CR tablet take 1 tablet by mouth twice a day 60 tablet 5  . vitamin B-12 (CYANOCOBALAMIN) 1000 MCG tablet Take 1,000 mcg by mouth daily.    . vitamin C (ASCORBIC ACID) 500 MG tablet Take 500 mg by mouth at bedtime.    . traZODone (DESYREL) 150 MG tablet TAKE 1 AND 2/3 TO 2 TABLETS BY MOUTH AT BEDTIME AS DIRECTED 180 tablet 2   No current facility-administered medications for this visit.     Review of Systems Review of Systems  Constitutional: Negative.   Respiratory: Negative.   Cardiovascular: Negative.   Gastrointestinal: Positive for anal bleeding and constipation.    Blood pressure 124/78, pulse 72, resp. rate 14, height 5' (1.524 m), weight 190 lb (86.2 kg).  Physical Exam Physical Exam  Constitutional: She is oriented to person, place, and time.  Cardiovascular: An irregularly irregular rhythm present.  Genitourinary: Rectal exam shows internal hemorrhoid and fissure.     Genitourinary Comments: Normal sphincter tone.  Neurological: She is alert and oriented to person, place, and time.  Skin: Skin is warm and dry.    Data Reviewed Anoscopy showed  posterior hemorrhoids, internal at the 4-5:00, 7:00 and 11 o'clock position.  Posterior lesions banded.  Assessment    Rectal bleeding secondary to internal hemorrhoids.  Chronic constipation secondary to calcium channel lockers and history of chronic IBS.    Plan  Patient is not able to change from calcium channel blockers to beta-blockers secondary to previous heart  failure and worsening headaches in the past.  No interested in pharmacologic management of her IBS-C issues except with luminal agent such as MiraLAX.  Patient to return in two weeks possible hemorrhoid banding. The patient is aware to call back for any questions or concerns.  Patient is aware she should not experience fever, chills or pain post banding, and to call if any develop.  We will plan to band the residual internal hemorrhoids at her next visit.  HPI, Physical Exam, Assessment and Plan have been scribed under the direction and in the presence of Hervey Ard, MD.  Gaspar Cola, CMA  I have completed the exam and reviewed the above documentation for accuracy and completeness.  I agree with the above.  Haematologist has been used and any errors in dictation or transcription are unintentional.  Hervey Ard, M.D., F.A.C.S.  Forest Gleason Keishawna Carranza 01/20/2018, 10:49 AM

## 2018-01-20 NOTE — Patient Instructions (Addendum)
Return in two weeks and possible banding.  The patient is aware to call back for any questions or concerns.

## 2018-01-29 ENCOUNTER — Telehealth: Payer: Self-pay

## 2018-01-29 ENCOUNTER — Other Ambulatory Visit: Payer: Self-pay | Admitting: Internal Medicine

## 2018-01-29 NOTE — Telephone Encounter (Signed)
FYI, and I have mailed patients lab results again Copied from [place]. Topic: General - Other >> Jan 23, 2018  3:42 PM Synthia Innocent wrote: Reason for CRM: Patient checking to make sure her lab results were mailed to her, she has not received them. Also wanted to let Dr Nicki Reaper now that Dr Bary Castilla is wonderful, help her with her hemorrhoids.

## 2018-01-31 ENCOUNTER — Other Ambulatory Visit: Payer: Self-pay | Admitting: Cardiovascular Disease

## 2018-01-31 NOTE — Telephone Encounter (Signed)
Please review for refill, Thanks !  

## 2018-02-04 ENCOUNTER — Encounter: Payer: Self-pay | Admitting: General Surgery

## 2018-02-04 ENCOUNTER — Ambulatory Visit (INDEPENDENT_AMBULATORY_CARE_PROVIDER_SITE_OTHER): Payer: Medicare Other | Admitting: General Surgery

## 2018-02-04 VITALS — BP 140/78 | HR 72 | Resp 14 | Ht 60.0 in | Wt 187.0 lb

## 2018-02-04 DIAGNOSIS — K648 Other hemorrhoids: Secondary | ICD-10-CM

## 2018-02-04 DIAGNOSIS — I25118 Atherosclerotic heart disease of native coronary artery with other forms of angina pectoris: Secondary | ICD-10-CM | POA: Diagnosis not present

## 2018-02-04 NOTE — Progress Notes (Signed)
Patient ID: Charlotte Wagner, Dr., female   DOB: 20-Apr-1936, 82 y.o.   MRN: [id number]  Chief Complaint  Patient presents with  . 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 Patient states the area is occlusally spots of blood but much better.  She reports a fullness on the right anterior side of the rectum which she relates to hemorrhoids. HPI  Past Medical History:  Diagnosis Date  . Allergy   . Arthritis    s/p bilateral knees and left hip replacement  . Asthma   . CAD (coronary artery disease)    a. 12/1996 s/p CABG x4 ([place]);  b. 2005 Pt reports stress test & cath, which revealed patent grafts.  . Cancer ([place])    melanoma right arm  . Carotid arterial disease ([place])    a. 04/2015 Carotid U/S: <50% bilat ICA stenosis.  . Chronic kidney disease   . Colon polyps    H/O  . Depression   . GERD (gastroesophageal reflux disease)    h/o hiatal hernia  . Headache    migraines in past  . Heart murmur    a. 04/2011 Echo: EF 55-60%, bilat atrial enlargement, mild to mod TR.  [name] History of chicken pox   . History of hiatal hernia   . Hx of migraines    rare now  . Hx: UTI (urinary tract infection)   . Hyperlipidemia    a. Statin intolerant -->on zetia.  . Hypertensive heart disease   . Lichen planus   . Melanoma ([place])   . Palpitations    a. rare PVC's and h/o SVT.  [name] PMR (polymyalgia rheumatica) (HCC)    h/o in setting of crestor usage.  [name] PSVT (paroxysmal supraventricular tachycardia) ([place])   . PUD (peptic ulcer disease)    remote history  . Raynaud's phenomenon   . Spinal stenosis   . Urine incontinence    H/O  . Vitamin D deficiency     Past Surgical History:  Procedure Laterality Date  . ADENOIDECTOMY     age 58  . BACK SURGERY     L3-L5  . BILATERAL CARPAL TUNNEL RELEASE    . BREAST BIOPSY Right    bx x 3 neg  . BREAST SURGERY Right    biopsy x 3 (all benign)  . CARDIAC  CATHETERIZATION N/A 06/01/2016   Procedure: LEFT HEART CATH AND CORS/GRAFTS ANGIOGRAPHY;  Surgeon: Wellington Hampshire, MD;  Location: [facility];  Service: Cardiovascular;  Laterality: N/A;  . CARDIAC CATHETERIZATION N/A 06/01/2016   Procedure: Coronary Stent Intervention;  Surgeon: Wellington Hampshire, MD;  Location: [facility];  Service: Cardiovascular;  Laterality: N/A;  . CARDIOVERSION N/A 03/08/2017   Procedure: CARDIOVERSION;  Surgeon: Wellington Hampshire, MD;  Location: [facility];  Service: Cardiovascular;  Laterality: N/A;  . CATARACT EXTRACTION W/PHACO Right 01/04/2016   Procedure: CATARACT EXTRACTION PHACO AND INTRAOCULAR LENS PLACEMENT ([place]);  Surgeon: Leandrew Koyanagi, MD;  Location: [facility];  Service: Ophthalmology;  Laterality: Right;  MALYUGIN  . CATARACT EXTRACTION W/PHACO Left 01/25/2016   Procedure: CATARACT EXTRACTION PHACO AND INTRAOCULAR LENS PLACEMENT ([place]) left eye;  Surgeon: Leandrew Koyanagi, MD;  Location: [facility];  Service: Ophthalmology;  Laterality: Left;  MALYUGIN SHUGARCAINE  . CHOLECYSTECTOMY  90's  . COLONOSCOPY WITH PROPOFOL N/A 05/03/2015   Procedure: COLONOSCOPY WITH PROPOFOL;  Surgeon: Lollie Sails,  MD;  Location: [facility];  Service: Endoscopy;  Laterality: N/A;  . CORONARY ARTERY BYPASS GRAFT  98   4 vessel  . ESOPHAGOGASTRODUODENOSCOPY N/A 03/22/2015   Procedure: ESOPHAGOGASTRODUODENOSCOPY (EGD);  Surgeon: Lollie Sails, MD;  Location: [facility];  Service: Endoscopy;  Laterality: N/A;  . JOINT REPLACEMENT     BILATERAL KNEE REPLACEMENTS  . KNEE ARTHROSCOPY W/ OATS PROCEDURE     Lt knee (9/01), Rt knee (3/11), Lt hip (5/10)  . TOTAL HIP ARTHROPLASTY      Family History  Problem Relation Age of Onset  . Thyroid disease Mother        graves disease  . Heart disease Father        rheumatic heart  . Alcohol abuse Father   . Depression Father   . Lung cancer Sister   . Depression Daughter   .  Thyroid disease Daughter        hashimoto  . Depression Son   . Stroke Maternal Grandmother   . Depression Maternal Grandmother   . Hypertension Maternal Grandfather   . Hypertension Paternal Grandfather   . Seizures Son   . Breast cancer Neg Hx     Social History Social History   Tobacco Use  . Smoking status: Former Research scientist (life sciences)  . Smokeless tobacco: Never Used  . Tobacco comment: quit 44+ yrs ago  Substance Use Topics  . Alcohol use: No    Alcohol/week: 0.0 oz  . Drug use: No    Allergies  Allergen Reactions  . Beta Adrenergic Blockers Shortness Of Breath    DEPRESSION/HYPOTENSION  . Brilinta [Ticagrelor] Shortness Of Breath    Caused AFIB  . Albuterol     rigors  . Aspirin Other (See Comments)    Heartburn   . Nsaids Other (See Comments)    ULCER HISTORY & CURRENTLY ON BLOOD THINNERS  . Penicillins Hives and Other (See Comments)    Has patient had a PCN reaction causing immediate rash, facial/tongue/throat swelling, SOB or lightheadedness with hypotension: No Has patient had a PCN reaction causing severe rash involving mucus membranes or skin necrosis: No Has patient had a PCN reaction that required hospitalization No Has patient had a PCN reaction occurring within the last 10 years: No If all of the above answers are "NO", then may proceed with Cephalosporin use.   . Statins Other (See Comments)    Muscle weakness  . Demeclocycline Rash  . Sulfa Antibiotics Rash    At mouth  . Sulfasalazine Rash    At mouth  . Tetracycline Rash  . Tetracyclines & Related Rash    Current Outpatient Medications  Medication Sig Dispense Refill  . acetaminophen (TYLENOL) 500 MG tablet Take 1,000 mg by mouth 2 (two) times daily.     [name] ADVAIR DISKUS 250-50 MCG/DOSE AEPB inhale 1 puff INTO THE LUNGS twice a day 60 each 1  . amitriptyline (ELAVIL) 25 MG tablet take 1 to 2 tablets by mouth at bedtime 60 tablet 5  . ATROVENT HFA 17 MCG/ACT inhaler inhale 2 puffs INTO THE LUNGS every 6  hours if needed for wheezing 12.9 g 2  . b complex vitamins tablet Take 1 tablet by mouth daily.    . Cholecalciferol (VITAMIN D3) 2000 UNITS TABS Take 2,000 Units by mouth daily.     . Coenzyme Q10 (COQ-10) 100 MG CAPS Take 100 mg by mouth daily.     [name] ELIQUIS 5 MG TABS tablet TAKE 1 TABLET BY MOUTH TWICE DAILY  60 tablet 0  . ezetimibe (ZETIA) 10 MG tablet TAKE 1 TABLET BY MOUTH ONCE DAILY 30 tablet 2  . folic acid (FOLVITE) 1 MG tablet Take 1 tablet (1 mg total) by mouth daily. 30 tablet 5  . furosemide (LASIX) 20 MG tablet Take 0.5 tablets (10 mg total) by mouth daily. (Patient taking differently: Take 20 mg by mouth daily as needed (for weight gain of 2 lbs or more). ) 15 tablet 3  . hydrocortisone 2.5 % cream apply to VULVA twice a day AT 10AM AND 4 PM (Patient taking differently: apply to VULVA at bedtime as needed for with miconazole for lichen planus) 28 g 0  . ketoconazole (NIZORAL) 2 % cream Apply 1 application topically daily. (Patient taking differently: Apply 1 application topically daily as needed (for crack bleeding fingers.). ) 30 g 0  . loratadine (CLARITIN) 10 MG tablet Take 10 mg by mouth daily.    . meclizine (ANTIVERT) 25 MG tablet Take 25 mg by mouth 3 (three) times daily as needed for dizziness.    . miconazole (MICOTIN) 2 % cream Apply 1 application topically at bedtime as needed (with hydrocortisone for lichen planus).     . Multiple Vitamins-Minerals (HAIR/SKIN/NAILS PO) Take 1 tablet by mouth daily.    . nitroGLYCERIN (NITROSTAT) 0.4 MG SL tablet Place 1 tablet (0.4 mg total) under the tongue every 5 (five) minutes as needed for chest pain. Place 1 tablet (0.4 mg total) under the tongue every 5 (five) minutes , 3 times as needed for chest pain. 15 tablet 0  . Omega-3 Fatty Acids (FISH OIL PO) Take 2,000 mg by mouth daily.    . pantoprazole (PROTONIX) 40 MG tablet take 1 tablet by mouth twice a day 60 tablet 5  . polyethylene glycol powder (GLYCOLAX/MIRALAX) powder Take 8.5  g by mouth once.    . ramipril (ALTACE) 10 MG capsule TAKE 1 CAPSULE BY MOUTH ONCE DAILY 30 capsule 3  . spironolactone (ALDACTONE) 25 MG tablet take 1 tablet by mouth every morning 30 tablet 5  . traMADol (ULTRAM) 50 MG tablet Take 1 tablet (50 mg total) by mouth daily as needed. 20 tablet 0  . traZODone (DESYREL) 150 MG tablet TAKE 1 AND 2/3 TO 2 TABLETS BY MOUTH AT BEDTIME AS DIRECTED 180 tablet 2  . verapamil (CALAN-SR) 180 MG CR tablet take 1 tablet by mouth twice a day 60 tablet 5  . vitamin B-12 (CYANOCOBALAMIN) 1000 MCG tablet Take 1,000 mcg by mouth daily.    . vitamin C (ASCORBIC ACID) 500 MG tablet Take 500 mg by mouth at bedtime.     No current facility-administered medications for this visit.     Review of Systems Review of Systems  Constitutional: Negative.   Respiratory: Negative.   Cardiovascular: Negative.     Blood pressure 140/78, pulse 72, resp. rate 14, height 5' (1.524 m), weight 187 lb (84.8 kg).  Physical Exam Physical Exam  Genitourinary:     Genitourinary Comments: Anoscopy showed the previously banded posterior hemorrhoids have sloughed without incident.  Normal sphincter tone.      Assessment    Doing well post hemorrhoid banding.    Plan Return in ine month.   HPI, Physical Exam, Assessment and Plan have been scribed under the direction and in the presence of Hervey Ard, MD.  Gaspar Cola, CMA  I have completed the exam and reviewed the above documentation for accuracy and completeness.  I agree with the above.  Diplomatic Services operational officer  Technology has been used and any errors in dictation or transcription are unintentional.  Hervey Ard, M.D., F.A.C.S.  Forest Gleason Yarisbel Miranda 02/05/2018, 5:50 AM

## 2018-02-04 NOTE — Patient Instructions (Signed)
Return in one month.  

## 2018-02-07 ENCOUNTER — Other Ambulatory Visit: Payer: Self-pay

## 2018-02-07 MED ORDER — PANTOPRAZOLE SODIUM 40 MG PO TBEC: 40.0000 mg | DELAYED_RELEASE_TABLET | Freq: Two times a day (BID) | ORAL | 1 refills | Status: DC

## 2018-02-07 MED ORDER — SPIRONOLACTONE 25 MG PO TABS: 25.0000 mg | ORAL_TABLET | Freq: Every morning | ORAL | 1 refills | Status: DC

## 2018-02-16 ENCOUNTER — Other Ambulatory Visit: Payer: Self-pay

## 2018-02-16 ENCOUNTER — Inpatient Hospital Stay
Admission: EM | Admit: 2018-02-16 | Discharge: 2018-02-18 | DRG: 349 | Disposition: A | Discharge status: Home / Self Care | Payer: Medicare Other | Attending: Internal Medicine | Admitting: Internal Medicine

## 2018-02-16 DIAGNOSIS — Z886 Allergy status to analgesic agent status: Secondary | ICD-10-CM

## 2018-02-16 DIAGNOSIS — I4891 Unspecified atrial fibrillation: Secondary | ICD-10-CM | POA: Diagnosis present

## 2018-02-16 DIAGNOSIS — Z8679 Personal history of other diseases of the circulatory system: Secondary | ICD-10-CM

## 2018-02-16 DIAGNOSIS — E559 Vitamin D deficiency, unspecified: Secondary | ICD-10-CM | POA: Diagnosis present

## 2018-02-16 DIAGNOSIS — Z85820 Personal history of malignant melanoma of skin: Secondary | ICD-10-CM

## 2018-02-16 DIAGNOSIS — I509 Heart failure, unspecified: Secondary | ICD-10-CM | POA: Diagnosis not present

## 2018-02-16 DIAGNOSIS — K649 Unspecified hemorrhoids: Principal | ICD-10-CM | POA: Diagnosis present

## 2018-02-16 DIAGNOSIS — Z87891 Personal history of nicotine dependence: Secondary | ICD-10-CM

## 2018-02-16 DIAGNOSIS — N189 Chronic kidney disease, unspecified: Secondary | ICD-10-CM | POA: Diagnosis present

## 2018-02-16 DIAGNOSIS — K5790 Diverticulosis of intestine, part unspecified, without perforation or abscess without bleeding: Secondary | ICD-10-CM | POA: Diagnosis not present

## 2018-02-16 DIAGNOSIS — F329 Major depressive disorder, single episode, unspecified: Secondary | ICD-10-CM | POA: Diagnosis present

## 2018-02-16 DIAGNOSIS — I739 Peripheral vascular disease, unspecified: Secondary | ICD-10-CM | POA: Diagnosis present

## 2018-02-16 DIAGNOSIS — Z7951 Long term (current) use of inhaled steroids: Secondary | ICD-10-CM

## 2018-02-16 DIAGNOSIS — Z8711 Personal history of peptic ulcer disease: Secondary | ICD-10-CM

## 2018-02-16 DIAGNOSIS — K625 Hemorrhage of anus and rectum: Secondary | ICD-10-CM

## 2018-02-16 DIAGNOSIS — M4800 Spinal stenosis, site unspecified: Secondary | ICD-10-CM | POA: Diagnosis present

## 2018-02-16 DIAGNOSIS — Z882 Allergy status to sulfonamides status: Secondary | ICD-10-CM

## 2018-02-16 DIAGNOSIS — J45909 Unspecified asthma, uncomplicated: Secondary | ICD-10-CM | POA: Diagnosis present

## 2018-02-16 DIAGNOSIS — I1310 Hypertensive heart and chronic kidney disease without heart failure, with stage 1 through stage 4 chronic kidney disease, or unspecified chronic kidney disease: Secondary | ICD-10-CM | POA: Diagnosis not present

## 2018-02-16 DIAGNOSIS — Z951 Presence of aortocoronary bypass graft: Secondary | ICD-10-CM

## 2018-02-16 DIAGNOSIS — E785 Hyperlipidemia, unspecified: Secondary | ICD-10-CM | POA: Diagnosis not present

## 2018-02-16 DIAGNOSIS — Z9049 Acquired absence of other specified parts of digestive tract: Secondary | ICD-10-CM

## 2018-02-16 DIAGNOSIS — R0902 Hypoxemia: Secondary | ICD-10-CM

## 2018-02-16 DIAGNOSIS — Z881 Allergy status to other antibiotic agents status: Secondary | ICD-10-CM

## 2018-02-16 DIAGNOSIS — F419 Anxiety disorder, unspecified: Secondary | ICD-10-CM | POA: Diagnosis present

## 2018-02-16 DIAGNOSIS — K922 Gastrointestinal hemorrhage, unspecified: Secondary | ICD-10-CM | POA: Diagnosis not present

## 2018-02-16 DIAGNOSIS — K219 Gastro-esophageal reflux disease without esophagitis: Secondary | ICD-10-CM | POA: Diagnosis present

## 2018-02-16 DIAGNOSIS — I7300 Raynaud's syndrome without gangrene: Secondary | ICD-10-CM | POA: Diagnosis present

## 2018-02-16 DIAGNOSIS — Z7901 Long term (current) use of anticoagulants: Secondary | ICD-10-CM

## 2018-02-16 DIAGNOSIS — I10 Essential (primary) hypertension: Secondary | ICD-10-CM | POA: Diagnosis not present

## 2018-02-16 DIAGNOSIS — M1990 Unspecified osteoarthritis, unspecified site: Secondary | ICD-10-CM | POA: Diagnosis present

## 2018-02-16 DIAGNOSIS — G709 Myoneural disorder, unspecified: Secondary | ICD-10-CM | POA: Diagnosis present

## 2018-02-16 DIAGNOSIS — Z8719 Personal history of other diseases of the digestive system: Secondary | ICD-10-CM

## 2018-02-16 DIAGNOSIS — Z96642 Presence of left artificial hip joint: Secondary | ICD-10-CM | POA: Diagnosis present

## 2018-02-16 DIAGNOSIS — Z66 Do not resuscitate: Secondary | ICD-10-CM | POA: Diagnosis present

## 2018-02-16 DIAGNOSIS — I2510 Atherosclerotic heart disease of native coronary artery without angina pectoris: Secondary | ICD-10-CM | POA: Diagnosis present

## 2018-02-16 DIAGNOSIS — Z888 Allergy status to other drugs, medicaments and biological substances status: Secondary | ICD-10-CM

## 2018-02-16 DIAGNOSIS — Z96653 Presence of artificial knee joint, bilateral: Secondary | ICD-10-CM | POA: Diagnosis present

## 2018-02-16 DIAGNOSIS — I340 Nonrheumatic mitral (valve) insufficiency: Secondary | ICD-10-CM | POA: Diagnosis not present

## 2018-02-16 DIAGNOSIS — M353 Polymyalgia rheumatica: Secondary | ICD-10-CM | POA: Diagnosis present

## 2018-02-16 DIAGNOSIS — Z86010 Personal history of colonic polyps: Secondary | ICD-10-CM

## 2018-02-16 DIAGNOSIS — Z880 Allergy status to penicillin: Secondary | ICD-10-CM

## 2018-02-16 DIAGNOSIS — D62 Acute posthemorrhagic anemia: Secondary | ICD-10-CM | POA: Diagnosis not present

## 2018-02-16 LAB — CBC
HEMATOCRIT: 37.2 % (ref 35.0–47.0)
Hemoglobin: 12.5 g/dL (ref 12.0–16.0)
MCH: 30.6 pg (ref 26.0–34.0)
MCHC: 33.6 g/dL (ref 32.0–36.0)
MCV: 91.1 fL (ref 80.0–100.0)
Platelets: 238 10*3/uL (ref 150–440)
RBC: 4.09 MIL/uL (ref 3.80–5.20)
RDW: 16.1 % — AB (ref 11.5–14.5)
WBC: 3.3 10*3/uL — ABNORMAL LOW (ref 3.6–11.0)

## 2018-02-16 LAB — TYPE AND SCREEN
ABO/RH(D): O POS
ANTIBODY SCREEN: NEGATIVE

## 2018-02-16 LAB — COMPREHENSIVE METABOLIC PANEL
ALBUMIN: 4.6 g/dL (ref 3.5–5.0)
ALT: 17 U/L (ref 14–54)
ANION GAP: 6 (ref 5–15)
AST: 29 U/L (ref 15–41)
Alkaline Phosphatase: 64 U/L (ref 38–126)
BILIRUBIN TOTAL: 0.8 mg/dL (ref 0.3–1.2)
BUN: 17 mg/dL (ref 6–20)
CO2: 27 mmol/L (ref 22–32)
Calcium: 9.5 mg/dL (ref 8.9–10.3)
Chloride: 99 mmol/L — ABNORMAL LOW (ref 101–111)
Creatinine, Ser: 0.82 mg/dL (ref 0.44–1.00)
GFR calc Af Amer: >60 mL/min (ref >60)
GFR calc non Af Amer: >60 mL/min (ref >60)
GLUCOSE: 120 mg/dL — AB (ref 65–99)
POTASSIUM: 4.3 mmol/L (ref 3.5–5.1)
Sodium: 132 mmol/L — ABNORMAL LOW (ref 135–145)
TOTAL PROTEIN: 7.2 g/dL (ref 6.5–8.1)

## 2018-02-16 LAB — HEMOGLOBIN
HEMOGLOBIN: 10.2 g/dL — AB (ref 12.0–16.0)
Hemoglobin: 7.7 g/dL — ABNORMAL LOW (ref 12.0–16.0)

## 2018-02-16 LAB — HEMOGLOBIN AND HEMATOCRIT, BLOOD
HEMATOCRIT: 33.1 % — AB (ref 35.0–47.0)
Hemoglobin: 11.2 g/dL — ABNORMAL LOW (ref 12.0–16.0)

## 2018-02-16 LAB — TROPONIN I

## 2018-02-16 MED ORDER — RAMIPRIL 10 MG PO CAPS
10.0000 mg | ORAL_CAPSULE | Freq: Every day | ORAL | Status: DC
  Filled 2018-02-16: qty 1

## 2018-02-16 MED ORDER — TRAZODONE HCL 100 MG PO TABS
200.0000 mg | ORAL_TABLET | Freq: Every day | ORAL | Status: DC
  Administered 2018-02-16 – 2018-02-17 (×2): 200 mg via ORAL
  Filled 2018-02-16 (×2): qty 2

## 2018-02-16 MED ORDER — VITAMIN C 500 MG PO TABS
500.0000 mg | ORAL_TABLET | Freq: Every day | ORAL | Status: DC
  Administered 2018-02-16 – 2018-02-17 (×2): 500 mg via ORAL
  Filled 2018-02-16 (×3): qty 1

## 2018-02-16 MED ORDER — EZETIMIBE 10 MG PO TABS
10.0000 mg | ORAL_TABLET | Freq: Every day | ORAL | Status: DC
  Administered 2018-02-16 – 2018-02-18 (×3): 10 mg via ORAL
  Filled 2018-02-16 (×3): qty 1

## 2018-02-16 MED ORDER — B COMPLEX-C PO TABS
1.0000 | ORAL_TABLET | Freq: Every day | ORAL | Status: DC
  Administered 2018-02-16 – 2018-02-17 (×2): 1 via ORAL
  Filled 2018-02-16 (×3): qty 1

## 2018-02-16 MED ORDER — AMITRIPTYLINE HCL 25 MG PO TABS
25.0000 mg | ORAL_TABLET | Freq: Every day | ORAL | Status: DC
  Administered 2018-02-16 – 2018-02-17 (×2): 25 mg via ORAL
  Filled 2018-02-16 (×3): qty 1

## 2018-02-16 MED ORDER — LORATADINE 10 MG PO TABS
10.0000 mg | ORAL_TABLET | Freq: Every day | ORAL | Status: DC
  Administered 2018-02-16 – 2018-02-18 (×3): 10 mg via ORAL
  Filled 2018-02-16 (×4): qty 1

## 2018-02-16 MED ORDER — PANTOPRAZOLE SODIUM 40 MG PO TBEC
40.0000 mg | DELAYED_RELEASE_TABLET | Freq: Two times a day (BID) | ORAL | Status: DC
  Administered 2018-02-16 – 2018-02-18 (×5): 40 mg via ORAL
  Filled 2018-02-16 (×5): qty 1

## 2018-02-16 MED ORDER — VERAPAMIL HCL ER 180 MG PO TBCR
180.0000 mg | EXTENDED_RELEASE_TABLET | Freq: Two times a day (BID) | ORAL | Status: DC
  Administered 2018-02-16 – 2018-02-18 (×5): 180 mg via ORAL
  Filled 2018-02-16 (×6): qty 1

## 2018-02-16 MED ORDER — VITAMIN D 1000 UNITS PO TABS
2000.0000 [IU] | ORAL_TABLET | ORAL | Status: DC
  Administered 2018-02-17: 2000 [IU] via ORAL
  Filled 2018-02-16 (×2): qty 2

## 2018-02-16 MED ORDER — IPRATROPIUM BROMIDE 0.02 % IN SOLN: 2.5000 mL | RESPIRATORY_TRACT | Status: DC | PRN

## 2018-02-16 MED ORDER — SODIUM CHLORIDE 0.9 % IV BOLUS
1000.0000 mL | Freq: Once | INTRAVENOUS | Status: AC
  Administered 2018-02-16: 1000 mL via INTRAVENOUS

## 2018-02-16 MED ORDER — COQ-10 200 MG PO CAPS: 200.0000 mg | ORAL_CAPSULE | Freq: Every day | ORAL | Status: DC

## 2018-02-16 MED ORDER — FOLIC ACID 1 MG PO TABS
1.0000 mg | ORAL_TABLET | Freq: Every day | ORAL | Status: DC
  Administered 2018-02-16 – 2018-02-18 (×3): 1 mg via ORAL
  Filled 2018-02-16 (×3): qty 1

## 2018-02-16 MED ORDER — NITROGLYCERIN 0.4 MG SL SUBL: 0.4000 mg | SUBLINGUAL_TABLET | SUBLINGUAL | Status: DC | PRN

## 2018-02-16 MED ORDER — SPIRONOLACTONE 25 MG PO TABS
25.0000 mg | ORAL_TABLET | Freq: Every morning | ORAL | Status: DC
  Administered 2018-02-16 – 2018-02-18 (×3): 25 mg via ORAL
  Filled 2018-02-16 (×3): qty 1

## 2018-02-16 MED ORDER — DOCUSATE SODIUM 100 MG PO CAPS
100.0000 mg | ORAL_CAPSULE | Freq: Two times a day (BID) | ORAL | Status: DC | PRN
  Filled 2018-02-16: qty 1

## 2018-02-16 MED ORDER — RAMIPRIL 10 MG PO CAPS
10.0000 mg | ORAL_CAPSULE | Freq: Every day | ORAL | Status: DC
  Administered 2018-02-16 – 2018-02-17 (×2): 10 mg via ORAL
  Filled 2018-02-16 (×3): qty 1

## 2018-02-16 MED ORDER — MOMETASONE FURO-FORMOTEROL FUM 200-5 MCG/ACT IN AERO
2.0000 | INHALATION_SPRAY | Freq: Two times a day (BID) | RESPIRATORY_TRACT | Status: DC
  Administered 2018-02-16 – 2018-02-18 (×2): 2 via RESPIRATORY_TRACT
  Filled 2018-02-16 (×2): qty 8.8

## 2018-02-16 MED ORDER — MECLIZINE HCL 25 MG PO TABS
25.0000 mg | ORAL_TABLET | Freq: Three times a day (TID) | ORAL | Status: DC | PRN
  Filled 2018-02-16: qty 1

## 2018-02-16 MED ORDER — ACETAMINOPHEN 500 MG PO TABS
1000.0000 mg | ORAL_TABLET | Freq: Two times a day (BID) | ORAL | Status: DC
  Administered 2018-02-16 – 2018-02-18 (×5): 1000 mg via ORAL
  Filled 2018-02-16 (×5): qty 2

## 2018-02-16 MED ORDER — SODIUM CHLORIDE 0.9 % IV SOLN
INTRAVENOUS | Status: DC
  Administered 2018-02-16 – 2018-02-17 (×3): via INTRAVENOUS

## 2018-02-16 MED ORDER — VITAMIN B-12 1000 MCG PO TABS
1000.0000 ug | ORAL_TABLET | Freq: Every day | ORAL | Status: DC
  Administered 2018-02-16 – 2018-02-18 (×3): 1000 ug via ORAL
  Filled 2018-02-16 (×3): qty 1

## 2018-02-16 MED ORDER — TRAMADOL HCL 50 MG PO TABS: 50.0000 mg | ORAL_TABLET | Freq: Every day | ORAL | Status: DC | PRN

## 2018-02-16 NOTE — Consult Note (Signed)
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  HPI: Rectal bleeding noted this AM when she was up to void. Recently ( 12 days ago) underwent banding of anterior hemorrhoids. Large volume of blood prompted presentation to the ED.  Past Medical History:  Diagnosis Date  . Allergy   . Arthritis    s/p bilateral knees and left hip replacement  . Asthma   . CAD (coronary artery disease)    a. 12/1996 s/p CABG x4 ([place]);  b. 2005 Pt reports stress test & cath, which revealed patent grafts.  . Cancer ([place])    melanoma right arm  . Carotid arterial disease ([place])    a. 04/2015 Carotid U/S: <50% bilat ICA stenosis.  . Chronic kidney disease   . Colon polyps    H/O  . Depression   . GERD (gastroesophageal reflux disease)    h/o hiatal hernia  . Headache    migraines in past  . Heart murmur    a. 04/2011 Echo: EF 55-60%, bilat atrial enlargement, mild to mod TR.  [name] History of chicken pox   . History of hiatal hernia   . Hx of migraines    rare now  . Hx: UTI (urinary tract infection)   . Hyperlipidemia    a. Statin intolerant -->on zetia.  . Hypertensive heart disease   . Lichen planus   . Melanoma ([place])   . Palpitations    a. rare PVC's and h/o SVT.  [name] PMR (polymyalgia rheumatica) (HCC)    h/o in setting of crestor usage.  [name] PSVT (paroxysmal supraventricular tachycardia) ([place])   . PUD (peptic ulcer disease)    remote history  . Raynaud's phenomenon   . Spinal stenosis   . Urine incontinence    H/O  . Vitamin D deficiency     Past Surgical History:  Procedure Laterality Date  . ADENOIDECTOMY     age 68  . BACK SURGERY     L3-L5  . BILATERAL CARPAL TUNNEL RELEASE    . BREAST BIOPSY Right    bx x 3 neg  . BREAST SURGERY Right    biopsy x 3 (all benign)  . CARDIAC CATHETERIZATION N/A 06/01/2016   Procedure: LEFT HEART CATH AND CORS/GRAFTS ANGIOGRAPHY;  Surgeon: Wellington Hampshire, MD;  Location: [facility];  Service: Cardiovascular;  Laterality: N/A;  . CARDIAC CATHETERIZATION N/A 06/01/2016   Procedure: Coronary Stent Intervention;  Surgeon: Wellington Hampshire, MD;  Location: [facility];  Service: Cardiovascular;  Laterality: N/A;  . CARDIOVERSION N/A 03/08/2017   Procedure: CARDIOVERSION;  Surgeon: Wellington Hampshire, MD;  Location: [facility];  Service: Cardiovascular;  Laterality: N/A;  . CATARACT EXTRACTION W/PHACO Right 01/04/2016   Procedure: CATARACT EXTRACTION PHACO AND INTRAOCULAR LENS PLACEMENT ([place]);  Surgeon: Leandrew Koyanagi, MD;  Location: [facility];  Service: Ophthalmology;  Laterality: Right;  MALYUGIN  . CATARACT EXTRACTION W/PHACO Left 01/25/2016   Procedure: CATARACT EXTRACTION PHACO AND INTRAOCULAR LENS PLACEMENT ([place]) left eye;  Surgeon: Leandrew Koyanagi, MD;  Location: [facility];  Service: Ophthalmology;  Laterality: Left;  MALYUGIN SHUGARCAINE  . CHOLECYSTECTOMY  90's  . COLONOSCOPY WITH PROPOFOL N/A 05/03/2015   Procedure: COLONOSCOPY WITH PROPOFOL;  Surgeon: Lollie Sails, MD;  Location[facility];  Service: Endoscopy;  Laterality: N/A;  . CORONARY ARTERY BYPASS GRAFT  98   4 vessel  . ESOPHAGOGASTRODUODENOSCOPY N/A 03/22/2015   Procedure:  ESOPHAGOGASTRODUODENOSCOPY (EGD);  Surgeon: Lollie Sails, MD;  Location: [facility] END[facility]COPY;  Service: Endoscopy;  Laterality: N/A;  . HEMORRHOID BANDING    . JOINT REPLACEMENT     BILATERAL KNEE REPLACEMENTS  . KNEE ARTHROSCOPY W/ OATS PROCEDURE     Lt knee (9/01), Rt knee (3/11), Lt hip (5/10)  . TOTAL HIP ARTHROPLASTY      Family History  Problem Relation Age of Onset  . Thyroid disease Mother        graves disease  . Heart disease Father        rheumatic heart  . Alcohol abuse Father   . Depression Father   . Lung cancer Sister   . Depression Daughter   . Thyroid disease Daughter        hashimoto  . Depression Son   . Stroke Maternal Grandmother   . Depression Maternal  Grandmother   . Hypertension Maternal Grandfather   . Hypertension Paternal Grandfather   . Seizures Son   . Breast cancer Neg Hx     Social History:  reports that she has quit smoking. She has never used smokeless tobacco. She reports that she does not drink alcohol or use drugs.  Allergies:  Allergies  Allergen Reactions  . Beta Adrenergic Blockers Shortness Of Breath    DEPRESSION/HYPOTENSION  . Brilinta [Ticagrelor] Shortness Of Breath    Caused AFIB  . Albuterol     rigors  . Aspirin Other (See Comments)    Heartburn   . Nsaids Other (See Comments)    ULCER HISTORY & CURRENTLY ON BLOOD THINNERS  . Penicillins Hives and Other (See Comments)    Has patient had a PCN reaction causing immediate rash, facial/tongue/throat swelling, SOB or lightheadedness with hypotension: No Has patient had a PCN reaction causing severe rash involving mucus membranes or skin necrosis: No Has patient had a PCN reaction that required hospitalization No Has patient had a PCN reaction occurring within the last 10 years: No If all of the above answers are "NO", then may proceed with Cephalosporin use.   . Statins Other (See Comments)    Muscle weakness  . Demeclocycline Rash  . Sulfa Antibiotics Rash    At mouth  . Sulfasalazine Rash    At mouth  . Tetracycline Rash  . Tetracyclines & Related Rash    Medications: I have reviewed the patient's current medications.  Results for orders placed or performed during the hospital encounter of 02/16/18 (from the past 48 hour(s))  CBC     Status: Abnormal   Collection Time: 02/16/18  5:50 AM  Result Value Ref Range   WBC 3.3 (L) 3.6 - 11.0 K/uL   RBC 4.09 3.80 - 5.20 MIL/uL   Hemoglobin 12.5 12.0 - 16.0 g/dL   HCT 37.2 35.0 - 47.0 %   MCV 91.1 80.0 - 100.0 fL   MCH 30.6 26.0 - 34.0 pg   MCHC 33.6 32.0 - 36.0 g/dL   RDW 16.1 (H) 11.5 - 14.5 %   Platelets 238 150 - 440 K/uL    Comment: Performed at [facility], [address]  [place], [place] [address]  Comprehensive metabolic panel     Status: Abnormal   Collection Time: 02/16/18  5:50 AM  Result Value Ref Range   Sodium 132 (L) 135 - 145 mmol/L   Potassium 4.3 3.5 - 5.1 mmol/L    Comment: HEMOLYSIS AT THIS LEVEL MAY AFFECT RESULT   Chloride 99 (L) 101 - 111 mmol/L  CO2 27 22 - 32 mmol/L   Glucose, Bld 120 (H) 65 - 99 mg/dL   BUN 17 6 - 20 mg/dL   Creatinine, Ser 0.82 0.44 - 1.00 mg/dL   Calcium 9.5 8.9 - 10.3 mg/dL   Total Protein 7.2 6.5 - 8.1 g/dL   Albumin 4.6 3.5 - 5.0 g/dL   AST 29 15 - 41 U/L   ALT 17 14 - 54 U/L   Alkaline Phosphatase 64 38 - 126 U/L   Total Bilirubin 0.8 0.3 - 1.2 mg/dL   GFR calc non Af Amer >60 >60 mL/min   GFR calc Af Amer >60 >60 mL/min    Comment: (NOTE) The eGFR has been calculated using the CKD EPI equation. This calculation has not been validated in all clinical situations. eGFR's persistently <60 mL/min signify possible Chronic Kidney Disease.    Anion gap 6 5 - 15    Comment: Performed at [facility], [address] [place], [place] [address]  Troponin I     Status: None   Collection Time: 02/16/18  5:50 AM  Result Value Ref Range   Troponin I <0.03 <0.03 ng/mL    Comment: Performed at [facility], [address] [place], [place] [address]  Type and screen Ordered by PROVIDER DEFAULT     Status: None   Collection Time: 02/16/18  8:03 AM  Result Value Ref Range   ABO/RH(D) O POS    Antibody Screen NEG    Sample Expiration      02/19/2018 Performed at [facility], [address] [place], [place] [address]   Hemoglobin and hematocrit, blood     Status: Abnormal   Collection Time: 02/16/18  8:04 AM  Result Value Ref Range   Hemoglobin 11.2 (L) 12.0 - 16.0 g/dL   HCT 33.1 (L) 35.0 - 47.0 %    Comment: Performed at [facility], [address], [place], [place] [address]    No results found.  ROS Blood pressure 113/73, pulse 80, temperature (!) 97.4 F (36.3 C),  temperature source Oral, resp. rate 14, height _0  (1.6 m), weight 184 lb (83.5 kg), SpO2 99 %.  BP/ P stable at this time.   Physical Exam  Genitourinary:       Assessment  Bleeding post band slough.  Eliquis held at present.   History of CHF.  Need to monitor fluids carefully, consider transfusion if rebleeding.  Plan: Area re-banded with cessation of bleeding. Have recommended formal exam under anesthesia and hemorrhoidectomy tomorrow under anesthesia. Procedure reviewed with patient.   Forest Gleason Zoria Rawlinson 02/16/2018, 1:14 PM

## 2018-02-16 NOTE — H&P (Signed)
[facility] NAME: Charlotte Wagner    MR#:  [id number]  DATE OF BIRTH:  [birth year]  DATE OF ADMISSION:  02/16/2018  PRIMARY CARE PHYSICIAN: [name], MD   REQUESTING/REFERRING PHYSICIAN: Dahlia Client  CHIEF COMPLAINT:   Chief Complaint  Patient presents with  . Rectal Bleeding    HISTORY OF PRESENT ILLNESS: Londan Wagner  is a 82 y.o. female with a known history of arthritis, CAD, asthma, melanoma, CKD, depression, GERD, atrial fibrillation, heart murmur with ejection fraction 55-60%, Reynaud's phenomena, spinal stenosis, peptic ulcer disease, on oral anticoagulation and had internal hemorrhoids which where Banded by Dr. Bary Castilla 12 days ago. Last night she woke up to go to the bathroom and noted some fullness in her rectum. And while walking she felt something on her right thigh and leg. When she turned on the light and checked she saw fresh red blood coming out of her rectum and pouring down her thigh and leg. She did not had any abdominal pain or any other symptoms but concerned with this, she decided to come to emergency room. Her hemoglobin was stable on arrival, but because of being on anticoagulation and history of hemorrhoid, she was given to keep on hospitalist service for observation.  PAST MEDICAL HISTORY:   Past Medical History:  Diagnosis Date  . Allergy   . Arthritis    s/p bilateral knees and left hip replacement  . Asthma   . CAD (coronary artery disease)    a. 12/1996 s/p CABG x4 ([place]);  b. 2005 Pt reports stress test & cath, which revealed patent grafts.  . Cancer ([place])    melanoma right arm  . Carotid arterial disease ([place])    a. 04/2015 Carotid U/S: <50% bilat ICA stenosis.  . Chronic kidney disease   . Colon polyps    H/O  . Depression   . GERD (gastroesophageal reflux disease)    h/o hiatal hernia  . Headache    migraines in past  . Heart murmur    a. 04/2011 Echo: EF 55-60%, bilat atrial  enlargement, mild to mod TR.  [name] History of chicken pox   . History of hiatal hernia   . Hx of migraines    rare now  . Hx: UTI (urinary tract infection)   . Hyperlipidemia    a. Statin intolerant -->on zetia.  . Hypertensive heart disease   . Lichen planus   . Melanoma ([place])   . Palpitations    a. rare PVC's and h/o SVT.  [name] PMR (polymyalgia rheumatica) (HCC)    h/o in setting of crestor usage.  [name] PSVT (paroxysmal supraventricular tachycardia) ([place])   . PUD (peptic ulcer disease)    remote history  . Raynaud's phenomenon   . Spinal stenosis   . Urine incontinence    H/O  . Vitamin D deficiency     PAST SURGICAL HISTORY:  Past Surgical History:  Procedure Laterality Date  . ADENOIDECTOMY     age 51  . BACK SURGERY     L3-L5  . BILATERAL CARPAL TUNNEL RELEASE    . BREAST BIOPSY Right    bx x 3 neg  . BREAST SURGERY Right    biopsy x 3 (all benign)  . CARDIAC CATHETERIZATION N/A 06/01/2016   Procedure: LEFT HEART CATH AND CORS/GRAFTS ANGIOGRAPHY;  Surgeon: Wellington Hampshire, MD;  Location: [facility];  Service: Cardiovascular;  Laterality: N/A;  . CARDIAC CATHETERIZATION N/A 06/01/2016  Procedure: Coronary Stent Intervention;  Surgeon: Wellington Hampshire, MD;  Location: [facility];  Service: Cardiovascular;  Laterality: N/A;  . CARDIOVERSION N/A 03/08/2017   Procedure: CARDIOVERSION;  Surgeon: Wellington Hampshire, MD;  Location: [facility];  Service: Cardiovascular;  Laterality: N/A;  . CATARACT EXTRACTION W/PHACO Right 01/04/2016   Procedure: CATARACT EXTRACTION PHACO AND INTRAOCULAR LENS PLACEMENT ([place]);  Surgeon: Leandrew Koyanagi, MD;  Location: [facility];  Service: Ophthalmology;  Laterality: Right;  MALYUGIN  . CATARACT EXTRACTION W/PHACO Left 01/25/2016   Procedure: CATARACT EXTRACTION PHACO AND INTRAOCULAR LENS PLACEMENT ([place]) left eye;  Surgeon: Leandrew Koyanagi, MD;  Location: [facility];  Service: Ophthalmology;  Laterality: Left;   MALYUGIN SHUGARCAINE  . CHOLECYSTECTOMY  90's  . COLONOSCOPY WITH PROPOFOL N/A 05/03/2015   Procedure: COLONOSCOPY WITH PROPOFOL;  Surgeon: Lollie Sails, MD;  Location: [facility];  Service: Endoscopy;  Laterality: N/A;  . CORONARY ARTERY BYPASS GRAFT  98   4 vessel  . ESOPHAGOGASTRODUODENOSCOPY N/A 03/22/2015   Procedure: ESOPHAGOGASTRODUODENOSCOPY (EGD);  Surgeon: Lollie Sails, MD;  Location: [facility] END[facility]COPY;  Service: Endoscopy;  Laterality: N/A;  . HEMORRHOID BANDING    . JOINT REPLACEMENT     BILATERAL KNEE REPLACEMENTS  . KNEE ARTHROSCOPY W/ OATS PROCEDURE     Lt knee (9/01), Rt knee (3/11), Lt hip (5/10)  . TOTAL HIP ARTHROPLASTY      SOCIAL HISTORY:  Social History   Tobacco Use  . Smoking status: Former Research scientist (life sciences)  . Smokeless tobacco: Never Used  . Tobacco comment: quit 44+ yrs ago  Substance Use Topics  . Alcohol use: No    Alcohol/week: 0.0 oz    FAMILY HISTORY:  Family History  Problem Relation Age of Onset  . Thyroid disease Mother        graves disease  . Heart disease Father        rheumatic heart  . Alcohol abuse Father   . Depression Father   . Lung cancer Sister   . Depression Daughter   . Thyroid disease Daughter        hashimoto  . Depression Son   . Stroke Maternal Grandmother   . Depression Maternal Grandmother   . Hypertension Maternal Grandfather   . Hypertension Paternal Grandfather   . Seizures Son   . Breast cancer Neg Hx     DRUG ALLERGIES:  Allergies  Allergen Reactions  . Beta Adrenergic Blockers Shortness Of Breath    DEPRESSION/HYPOTENSION  . Brilinta [Ticagrelor] Shortness Of Breath    Caused AFIB  . Albuterol     rigors  . Aspirin Other (See Comments)    Heartburn   . Nsaids Other (See Comments)    ULCER HISTORY & CURRENTLY ON BLOOD THINNERS  . Penicillins Hives and Other (See Comments)    Has patient had a PCN reaction causing immediate rash, facial/tongue/throat swelling, SOB or lightheadedness with  hypotension: No Has patient had a PCN reaction causing severe rash involving mucus membranes or skin necrosis: No Has patient had a PCN reaction that required hospitalization No Has patient had a PCN reaction occurring within the last 10 years: No If all of the above answers are "NO", then may proceed with Cephalosporin use.   . Statins Other (See Comments)    Muscle weakness  . Demeclocycline Rash  . Sulfa Antibiotics Rash    At mouth  . Sulfasalazine Rash    At mouth  . Tetracycline Rash  . Tetracyclines & Related Rash  REVIEW OF SYSTEMS:   CONSTITUTIONAL: No fever, fatigue or weakness.  EYES: No blurred or double vision.  EARS, NOSE, AND THROAT: No tinnitus or ear pain.  RESPIRATORY: No cough, shortness of breath, wheezing or hemoptysis.  CARDIOVASCULAR: No chest pain, orthopnea, edema.  GASTROINTESTINAL: No nausea, vomiting, diarrhea or abdominal pain.  GENITOURINARY: No dysuria, hematuria.  ENDOCRINE: No polyuria, nocturia,  HEMATOLOGY: No anemia, easy bruising or bleeding SKIN: No rash or lesion. MUSCULOSKELETAL: No joint pain or arthritis.   NEUROLOGIC: No tingling, numbness, weakness.  PSYCHIATRY: No anxiety or depression.   MEDICATIONS AT HOME:  Prior to Admission medications   Medication Sig Start Date End Date Taking? Authorizing Provider  acetaminophen (TYLENOL) 500 MG tablet Take 1,000 mg by mouth 2 (two) times daily.    Yes [provider]  ADVAIR DISKUS 250-50 MCG/DOSE AEPB inhale 1 puff INTO THE LUNGS twice a day 04/25/17  Yes [name], MD  amitriptyline (ELAVIL) 25 MG tablet take 1 to 2 tablets by mouth at bedtime Patient taking differently: Take 25 mg by mouth at bedtime 09/13/17  Yes [name], MD  ATROVENT HFA 17 MCG/ACT inhaler inhale 2 puffs INTO THE LUNGS every 6 hours if needed for wheezing 04/25/17  Yes [name], MD  b complex vitamins tablet Take 1 tablet by mouth daily.   Yes [provider]  Cholecalciferol  (VITAMIN D3) 2000 UNITS TABS Take 2,000 Units by mouth 2 (two) times a week.    Yes [provider]  Coenzyme Q10 (COQ-10) 200 MG CAPS Take 200 mg by mouth daily.    Yes [provider]  ELIQUIS 5 MG TABS tablet TAKE 1 TABLET BY MOUTH TWICE DAILY 02/03/18  Yes Gollan, Kathlene November, MD  ezetimibe (ZETIA) 10 MG tablet TAKE 1 TABLET BY MOUTH ONCE DAILY 01/29/18  Yes [name], MD  folic acid (FOLVITE) 1 MG tablet Take 1 tablet (1 mg total) by mouth daily. 01/13/18  Yes [name], MD  hydrocortisone 2.5 % cream apply to VULVA twice a day AT 10AM AND 4 PM Patient taking differently: Apply to VULVA at bedtime as needed with miconazole for lichen planus 02/12/83  Yes [name], MD  ketoconazole (NIZORAL) 2 % cream Apply 1 application topically daily. Patient taking differently: Apply 1 application topically daily as needed (for crack bleeding fingers.).  06/15/14  Yes [name], MD  loratadine (CLARITIN) 10 MG tablet Take 10 mg by mouth daily.   Yes [provider]  meclizine (ANTIVERT) 25 MG tablet Take 25 mg by mouth 3 (three) times daily as needed for dizziness.   Yes [provider]  miconazole (MICOTIN) 2 % cream Apply 1 application topically at bedtime as needed (with hydrocortisone for lichen planus).    Yes [provider]  Multiple Vitamins-Minerals (HAIR/SKIN/NAILS PO) Take 1 tablet by mouth daily.   Yes [provider]  nitroGLYCERIN (NITROSTAT) 0.4 MG SL tablet Place 1 tablet (0.4 mg total) under the tongue every 5 (five) minutes as needed for chest pain. Place 1 tablet (0.4 mg total) under the tongue every 5 (five) minutes , 3 times as needed for chest pain. 06/07/16  Yes Gouru, Illene Silver, MD  Omega-3 Fatty Acids (FISH OIL) 1000 MG CAPS Take 1,000 mg by mouth daily.    Yes [provider]  pantoprazole (PROTONIX) 40 MG tablet Take 1 tablet (40 mg total) by mouth 2 (two) times daily. 02/07/18  Yes [name], MD   polyethylene glycol powder (GLYCOLAX/MIRALAX) powder Take  8.5 g by mouth daily.    Yes [provider]  ramipril (ALTACE) 10 MG capsule TAKE 1 CAPSULE BY MOUTH ONCE DAILY 01/02/18  Yes [name], MD  spironolactone (ALDACTONE) 25 MG tablet Take 1 tablet (25 mg total) by mouth every morning. 02/07/18  Yes [name], MD  traMADol (ULTRAM) 50 MG tablet Take 1 tablet (50 mg total) by mouth daily as needed. Patient taking differently: Take 50 mg by mouth daily as needed for severe pain.  03/12/17  Yes [name], MD  traZODone (DESYREL) 150 MG tablet TAKE 1 AND 2/3 TO 2 TABLETS BY MOUTH AT BEDTIME AS DIRECTED Patient taking differently: Take 250 mg by mouth at bedtime (1 and 2/3 tablets) 01/20/18  Yes [name], MD  verapamil (CALAN-SR) 180 MG CR tablet take 1 tablet by mouth twice a day 10/31/17  Yes [name], MD  vitamin B-12 (CYANOCOBALAMIN) 1000 MCG tablet Take 1,000 mcg by mouth daily.   Yes [provider]  vitamin C (ASCORBIC ACID) 500 MG tablet Take 500 mg by mouth at bedtime.   Yes [provider]  furosemide (LASIX) 20 MG tablet Take 0.5 tablets (10 mg total) by mouth daily. Patient not taking: Reported on 02/16/2018 10/11/16 03/06/18  Wende Bushy, MD      PHYSICAL EXAMINATION:   VITAL SIGNS: Blood pressure 113/73, pulse 80, temperature (!) 97.4 F (36.3 C), temperature source Oral, resp. rate 14, height 5\' 3"  (1.6 m), weight 83.5 kg (184 lb), SpO2 99 %.  GENERAL:  82 y.o.-year-old patient lying in the bed with no acute distress.  EYES: Pupils equal, round, reactive to light and accommodation. No scleral icterus. Extraocular muscles intact.  HEENT: Head atraumatic, normocephalic. Oropharynx and nasopharynx clear.  NECK:  Supple, no jugular venous distention. No thyroid enlargement, no tenderness.  LUNGS: Normal breath sounds bilaterally, no wheezing, rales,rhonchi or crepitation. No use of accessory muscles of respiration.   CARDIOVASCULAR: S1, S2 normal. No murmurs, rubs, or gallops.  ABDOMEN: Soft, nontender, nondistended. Bowel sounds present. No organomegaly or mass.  EXTREMITIES: No pedal edema, cyanosis, or clubbing.  NEUROLOGIC: Cranial nerves II through XII are intact. Muscle strength 5/5 in all extremities. Sensation intact. Gait not checked.  PSYCHIATRIC: The patient is alert and oriented x 3.  SKIN: No obvious rash, lesion, or ulcer.   LABORATORY PANEL:   CBC Recent Labs  Lab 02/16/18 0550 02/16/18 0804 02/16/18 1437  WBC 3.3*  --   --   HGB 12.5 11.2* 10.2*  HCT 37.2 33.1*  --   PLT 238  --   --   MCV 91.1  --   --   MCH 30.6  --   --   MCHC 33.6  --   --   RDW 16.1*  --   --    ------------------------------------------------------------------------------------------------------------------  Chemistries  Recent Labs  Lab 02/16/18 0550  NA 132*  K 4.3  CL 99*  CO2 27  GLUCOSE 120*  BUN 17  CREATININE 0.82  CALCIUM 9.5  AST 29  ALT 17  ALKPHOS 64  BILITOT 0.8   ------------------------------------------------------------------------------------------------------------------ estimated creatinine clearance is 56.9 mL/min (by C-G formula based on SCr of 0.82 mg/dL). ------------------------------------------------------------------------------------------------------------------ No results for input(s): TSH, T4TOTAL, T3FREE, THYROIDAB in the last 72 hours.  Invalid input(s): FREET3   Coagulation profile No results for input(s): INR, PROTIME in the last 168 hours. ------------------------------------------------------------------------------------------------------------------- No results for input(s): DDIMER in the last 72 hours. -------------------------------------------------------------------------------------------------------------------  Cardiac Enzymes Recent Labs  Lab 02/16/18  [place] <0.03    ------------------------------------------------------------------------------------------------------------------ Invalid input(s): POCBNP  ---------------------------------------------------------------------------------------------------------------  Urinalysis    Component Value Date/Time   COLORURINE YELLOW (A) 03/12/2017 1751   APPEARANCEUR CLEAR (A) 03/12/2017 1751   LABSPEC 1.004 (L) 03/12/2017 1751   PHURINE 7.0 03/12/2017 1751   GLUCOSEU NEGATIVE 03/12/2017 1751   GLUCOSEU NEGATIVE 12/30/2014 1455   HGBUR NEGATIVE 03/12/2017 1751   BILIRUBINUR NEGATIVE 03/12/2017 1751   BILIRUBINUR neg 05/12/2015 1107   KETONESUR NEGATIVE 03/12/2017 1751   PROTEINUR NEGATIVE 03/12/2017 1751   UROBILINOGEN 0.2 05/12/2015 1107   UROBILINOGEN 0.2 12/30/2014 1455   NITRITE NEGATIVE 03/12/2017 1751   LEUKOCYTESUR NEGATIVE 03/12/2017 1751     RADIOLOGY: No results found.  EKG: Orders placed or performed during the hospital encounter of 02/16/18  . EKG 12-Lead  . EKG 12-Lead  . ED EKG  . ED EKG    IMPRESSION AND PLAN:  * GI bleed   Hemoglobin is stable, continue monitoring.   Will transfuse if less than 7 or patient becomes symptomatic.  * Lower GI bleed, hemorrhoidal bleed   Patient had hemorrhoidal banding done 12 days ago.   Currently we will hold anticoagulation and continue monitoring.   I spoke with patient's surgeon on phone, he will follow up patient in hospital and decide if she needs further procedures.  * Atrial fibrillation   Continue verapamil, hold anticoagulation.  * Hypertension   Continue spironolactone, ramipril. Monitor for GI bleed.   All the records are reviewed and case discussed with ED provider. Management plans discussed with the patient, family and they are in agreement.  CODE STATUS: DNR    Code Status Orders  (From admission, onward)        Start     Ordered   02/16/18 1039  Do not attempt resuscitation (DNR)  Continuous     Question Answer Comment  In the event of cardiac or respiratory ARREST Do not call a "code blue"   In the event of cardiac or respiratory ARREST Do not perform Intubation, CPR, defibrillation or ACLS   In the event of cardiac or respiratory ARREST Use medication by any route, position, wound care, and other measures to relive pain and suffering. May use oxygen, suction and manual treatment of airway obstruction as needed for comfort.      02/16/18 1038    Code Status History    Date Active Date Inactive Code Status Order ID Comments User Context   03/12/2017 2113 03/13/2017 1834 Full Code [id number]  Gladstone Lighter, MD Inpatient   10/08/2016 1834 10/10/2016 1526 Full Code [id number]  [name], MD ED   06/07/2016 1022 06/07/2016 1724 Full Code [id number]  Nicholes Mango, MD Inpatient   06/06/2016 2211 06/07/2016 1021 DNR [id number]  Max Sane, MD ED   06/01/2016 0019 06/01/2016 1356 Full Code [id number]  Lance Coon, MD Inpatient    Advance Directive Documentation     Most Recent Value  Type of Advance Directive  Healthcare Power of Attorney, Living will, Out of facility DNR (pink MOST or yellow form)  Pre-existing out of facility DNR order (yellow form or pink MOST form)  -- [Pt states she did not bring her DNR form with her]  "MOST" Form in Place?  -       TOTAL TIME TAKING CARE OF THIS PATIENT: 50 minutes.    Vaughan Basta M.D on 02/16/2018   Between 7am to 6pm - Pager - [phone]  After 6pm go to [email or web address] - password  [email or web address]  [facility]  Office  [phone]  CC: Primary care physician; [name], MD   Note: This dictation was prepared with Dragon dictation along with smaller phrase technology. Any transcriptional errors that result from this process are unintentional.

## 2018-02-16 NOTE — Progress Notes (Signed)
Spoke with Dr.Maier via  Phone about hemoglobin decreased from 10.2 to 7.7. No new orders at this time. Per MD wait to see what 6am results show. Charlotte Wagner

## 2018-02-16 NOTE — ED Triage Notes (Signed)
Pt brought in by [facility] with reports of bleeding from rectum. Pt states hx of hemorrhoids with recent banding. Pt states the amount of "bleeding is uncontrolled" and has "bled through a couple towels." Pt also states hx of diverticulosis. Pt states she is on Eliquis for hx of afib. Pt A&O at this time. Pt denies N/V, reports some rectal pressure.
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
## 2018-02-16 NOTE — Progress Notes (Signed)

## 2018-02-16 NOTE — ED Notes (Signed)
Pt given peri care due to bleeding from rectum, no clots noted at this time. Pt declined wearing a brief and requested to have a towel placed between legs which was done.

## 2018-02-16 NOTE — Progress Notes (Signed)
Came with Lower GI bleed, on Eliquis. Hb stable for now. Spoke to Dr. Bary Castilla for possible hemorrhoidal surgery. Suggest to monitor now, may feed today as no plans for urgent surgery. Hold Eliquis, serial Hb and IV fluids for today.

## 2018-02-17 ENCOUNTER — Observation Stay: Payer: Medicare Other | Admitting: Anesthesiology

## 2018-02-17 ENCOUNTER — Observation Stay: Payer: Medicare Other

## 2018-02-17 ENCOUNTER — Encounter
Admission: EM | Disposition: A | Discharge status: Home / Self Care | Payer: Self-pay | Source: Home / Self Care | Attending: Internal Medicine

## 2018-02-17 DIAGNOSIS — R857 Abnormal histological findings in specimens from digestive organs and abdominal cavity: Secondary | ICD-10-CM | POA: Diagnosis not present

## 2018-02-17 DIAGNOSIS — E785 Hyperlipidemia, unspecified: Secondary | ICD-10-CM | POA: Diagnosis not present

## 2018-02-17 DIAGNOSIS — K649 Unspecified hemorrhoids: Secondary | ICD-10-CM | POA: Diagnosis not present

## 2018-02-17 DIAGNOSIS — D62 Acute posthemorrhagic anemia: Secondary | ICD-10-CM | POA: Diagnosis not present

## 2018-02-17 DIAGNOSIS — I4891 Unspecified atrial fibrillation: Secondary | ICD-10-CM | POA: Diagnosis not present

## 2018-02-17 DIAGNOSIS — I10 Essential (primary) hypertension: Secondary | ICD-10-CM | POA: Diagnosis not present

## 2018-02-17 DIAGNOSIS — I509 Heart failure, unspecified: Secondary | ICD-10-CM | POA: Diagnosis not present

## 2018-02-17 DIAGNOSIS — R0902 Hypoxemia: Secondary | ICD-10-CM | POA: Diagnosis not present

## 2018-02-17 DIAGNOSIS — K922 Gastrointestinal hemorrhage, unspecified: Secondary | ICD-10-CM | POA: Diagnosis not present

## 2018-02-17 HISTORY — PX: HEMORRHOID SURGERY: SHX153

## 2018-02-17 LAB — CBC
HEMATOCRIT: 22 % — AB (ref 35.0–47.0)
HEMOGLOBIN: 7.6 g/dL — AB (ref 12.0–16.0)
MCH: 31.1 pg (ref 26.0–34.0)
MCHC: 34.3 g/dL (ref 32.0–36.0)
MCV: 90.6 fL (ref 80.0–100.0)
Platelets: 174 10*3/uL (ref 150–440)
RBC: 2.43 MIL/uL — AB (ref 3.80–5.20)
RDW: 15.6 % — ABNORMAL HIGH (ref 11.5–14.5)
WBC: 3.6 10*3/uL (ref 3.6–11.0)

## 2018-02-17 LAB — BASIC METABOLIC PANEL
ANION GAP: 6 (ref 5–15)
BUN: 18 mg/dL (ref 6–20)
CHLORIDE: 101 mmol/L (ref 101–111)
CO2: 24 mmol/L (ref 22–32)
Calcium: 8.4 mg/dL — ABNORMAL LOW (ref 8.9–10.3)
Creatinine, Ser: 0.87 mg/dL (ref 0.44–1.00)
GFR calc Af Amer: >60 mL/min (ref >60)
Glucose, Bld: 116 mg/dL — ABNORMAL HIGH (ref 65–99)
POTASSIUM: 4.1 mmol/L (ref 3.5–5.1)
Sodium: 131 mmol/L — ABNORMAL LOW (ref 135–145)

## 2018-02-17 LAB — HEMOGLOBIN: Hemoglobin: 7.4 g/dL — ABNORMAL LOW (ref 12.0–16.0)

## 2018-02-17 SURGERY — HEMORRHOIDECTOMY
Anesthesia: General | Site: Abdomen | Wound class: Clean Contaminated

## 2018-02-17 MED ORDER — FENTANYL CITRATE (PF) 100 MCG/2ML IJ SOLN
INTRAMUSCULAR | Status: AC
  Filled 2018-02-17: qty 2

## 2018-02-17 MED ORDER — ACETAMINOPHEN 325 MG PO TABS
650.0000 mg | ORAL_TABLET | Freq: Four times a day (QID) | ORAL | Status: DC | PRN
  Administered 2018-02-17: 650 mg via ORAL
  Filled 2018-02-17: qty 2

## 2018-02-17 MED ORDER — MIDAZOLAM HCL 2 MG/2ML IJ SOLN
INTRAMUSCULAR | Status: AC
  Filled 2018-02-17: qty 2

## 2018-02-17 MED ORDER — ONDANSETRON HCL 4 MG/2ML IJ SOLN: 4.0000 mg | Freq: Once | INTRAMUSCULAR | Status: DC | PRN

## 2018-02-17 MED ORDER — FENTANYL CITRATE (PF) 100 MCG/2ML IJ SOLN
25.0000 ug | INTRAMUSCULAR | Status: DC | PRN
  Administered 2018-02-17 (×2): 25 ug via INTRAVENOUS

## 2018-02-17 MED ORDER — FENTANYL CITRATE (PF) 100 MCG/2ML IJ SOLN
INTRAMUSCULAR | Status: DC | PRN
  Administered 2018-02-17: 50 ug via INTRAVENOUS
  Administered 2018-02-17 (×3): 25 ug via INTRAVENOUS
  Administered 2018-02-17: 50 ug via INTRAVENOUS
  Administered 2018-02-17: 25 ug via INTRAVENOUS

## 2018-02-17 MED ORDER — DEXMEDETOMIDINE HCL IN NACL 200 MCG/50ML IV SOLN
INTRAVENOUS | Status: DC | PRN
  Administered 2018-02-17: 0.4 ug/kg/h via INTRAVENOUS

## 2018-02-17 MED ORDER — BUPIVACAINE-EPINEPHRINE 0.5% -1:200000 IJ SOLN
INTRAMUSCULAR | Status: DC | PRN
  Administered 2018-02-17: 30 mL

## 2018-02-17 MED ORDER — MORPHINE SULFATE (PF) 2 MG/ML IV SOLN: 2.0000 mg | INTRAVENOUS | Status: DC | PRN

## 2018-02-17 MED ORDER — FENTANYL CITRATE (PF) 100 MCG/2ML IJ SOLN
INTRAMUSCULAR | Status: AC
  Administered 2018-02-17: 25 ug via INTRAVENOUS
  Filled 2018-02-17: qty 2

## 2018-02-17 MED ORDER — LACTATED RINGERS IV SOLN
INTRAVENOUS | Status: DC | PRN
  Administered 2018-02-17: 18:00:00 via INTRAVENOUS

## 2018-02-17 MED ORDER — SODIUM CHLORIDE 0.9 % IV SOLN
1.0000 g | Freq: Once | INTRAVENOUS | Status: AC
  Administered 2018-02-17: 1 g via INTRAVENOUS
  Filled 2018-02-17: qty 1

## 2018-02-17 MED ORDER — BUPIVACAINE LIPOSOME 1.3 % IJ SUSP
INTRAMUSCULAR | Status: DC | PRN
  Administered 2018-02-17: 18:00:00

## 2018-02-17 MED ORDER — ONDANSETRON HCL 4 MG/2ML IJ SOLN
INTRAMUSCULAR | Status: DC | PRN
  Administered 2018-02-17: 4 mg via INTRAVENOUS

## 2018-02-17 MED ORDER — POLYETHYLENE GLYCOL 3350 17 G PO PACK
8.5000 g | PACK | Freq: Every day | ORAL | Status: DC
  Administered 2018-02-17: 8.5 g via ORAL
  Filled 2018-02-17: qty 1

## 2018-02-17 MED ORDER — MIDAZOLAM HCL 2 MG/2ML IJ SOLN
INTRAMUSCULAR | Status: DC | PRN
  Administered 2018-02-17: 2 mg via INTRAVENOUS

## 2018-02-17 SURGICAL SUPPLY — 28 items
BLADE SURG 15 STRL SS SAFETY (BLADE) ×3 IMPLANT
BRIEF STRETCH MATERNITY 2XLG (MISCELLANEOUS) ×3 IMPLANT
CANISTER SUCT 1200ML W/VALVE (MISCELLANEOUS) ×3 IMPLANT
DRAPE LAPAROTOMY 100X77 ABD (DRAPES) ×3 IMPLANT
DRAPE LEGGINS SURG 28X43 STRL (DRAPES) ×3 IMPLANT
DRAPE UNDER BUTTOCK W/FLU (DRAPES) ×3 IMPLANT
DRSG GAUZE PETRO 6X36 STRIP ST (GAUZE/BANDAGES/DRESSINGS) ×3 IMPLANT
DRSG VASELINE 1/2X72 (GAUZE/BANDAGES/DRESSINGS) ×3 IMPLANT
ELECT REM PT RETURN 9FT ADLT (ELECTROSURGICAL) ×3
ELECTRODE REM PT RTRN 9FT ADLT (ELECTROSURGICAL) ×1 IMPLANT
GLOVE BIO SURGEON STRL SZ7.5 (GLOVE) ×3 IMPLANT
GLOVE INDICATOR 8.0 STRL GRN (GLOVE) ×3 IMPLANT
GOWN STRL REUS W/ TWL LRG LVL3 (GOWN DISPOSABLE) ×2 IMPLANT
GOWN STRL REUS W/TWL LRG LVL3 (GOWN DISPOSABLE) ×4
KIT TURNOVER KIT A (KITS) ×3 IMPLANT
LABEL OR SOLS (LABEL) ×3 IMPLANT
NEEDLE HYPO 22GX1.5 SAFETY (NEEDLE) ×3 IMPLANT
NEEDLE HYPO 25X1 1.5 SAFETY (NEEDLE) ×3 IMPLANT
PACK BASIN MINOR ARMC (MISCELLANEOUS) ×3 IMPLANT
PAD OB MATERNITY 4.3X12.25 (PERSONAL CARE ITEMS) ×3 IMPLANT
PAD PREP 24X41 OB/GYN DISP (PERSONAL CARE ITEMS) ×3 IMPLANT
SHEARS FOC LG CVD HARMONIC 17C (MISCELLANEOUS) ×3 IMPLANT
SURGILUBE 2OZ TUBE FLIPTOP (MISCELLANEOUS) ×3 IMPLANT
SUT CHROMIC 3 0 SH 27 (SUTURE) ×6 IMPLANT
SUT SILK 0 CT 1 30 (SUTURE) ×3 IMPLANT
SUT VIC AB 3-0 SH 27 (SUTURE)
SUT VIC AB 3-0 SH 27X BRD (SUTURE) IMPLANT
SYR 10ML LL (SYRINGE) ×6 IMPLANT

## 2018-02-17 NOTE — Anesthesia Post-op Follow-up Note (Signed)
Anesthesia QCDR form completed.        

## 2018-02-17 NOTE — Op Note (Signed)
Preoperative diagnosis: Hemorrhoids with hemorrhage.  Postoperative diagnosis: Same.  Operative procedure: Exam under anesthesia, hemorrhoidectomy.  Operating Surgeon: Hervey Ard, MD.  Anesthesia: Monitored anesthesia care, Marcaine 0.5% with 1 to 200,000 units of epinephrine, 30 cc with Exparel :20 cc.  Estimated blood loss: 15 cc.  Clinical note: This 82 year old woman had presented the morning of May 12 with rectal hemorrhage.  At that time she was 12 days post hemorrhoid banding for recurrent rectal bleeding on Eliquis.  She had previously undergone banding of posterior hemorrhoids approximately 6 weeks earlier without incident.  Examination showed a bleeding site on the anterior rectum and a new hemorrhoid band was placed with control bleeding.  She is brought to the operating at this time for exam under anesthesia and probable hemorrhoidectomy.  Patient received Invanz prior to the procedure.  She is not a candidate for pharmacologic DVT prevention due to her recent bleeding.  Operative note: Patient was placed in dorsolithotomy position and sedation instituted by anesthesia.  The above-mentioned local anesthetics were infiltrated as a perineal block after skin cleansing with Betadine solution.  The rectal musculature was intact.  The posterior rectal wall where her original hemorrhoids were banded about 6 weeks ago was unremarkable.  The anterior rectal wall showed some persistent hemorrhoidal disease at the 2 and 10 o'clock position.  These areas were removed making use of the harmonic scalpel.  The mucosal defect was closed with interrupted 3-0 chromic figure-of-eight sutures.  The original bleeding site which is approximately 11-12 o'clock position was oversewn with a similar 3-0 chromic figure-of-eight suture.  One 3-0 Vicryl suture was used on the left anterior site for final hemostasis.  Vaseline gauze pack was placed in the rectum and the patient taken to recovery room in stable  condition.

## 2018-02-17 NOTE — Transfer of Care (Signed)
Immediate Anesthesia Transfer of Care Note  Patient: Charlotte Wagner, Dr.  Jule Ser) Performed: HEMORRHOIDECTOMY (N/A Abdomen)  Patient Location: PACU  Anesthesia Type:MAC  Level of Consciousness: awake, alert  and oriented  Airway & Oxygen Therapy: Patient connected to face mask oxygen  Post-op Assessment: Post -op Vital signs reviewed and stable  Post vital signs: stable  Last Vitals:  Vitals Value Taken Time  BP 119/48 02/17/2018  6:47 PM  Temp 36.7 C 02/17/2018  6:45 PM  Pulse 72 02/17/2018  6:47 PM  Resp 22 02/17/2018  6:47 PM  SpO2 100 % 02/17/2018  6:47 PM  Vitals shown include unvalidated device data.  Last Pain:  Vitals:   02/17/18 1410  TempSrc: Oral  PainSc:          Complications: No apparent anesthesia complications

## 2018-02-17 NOTE — Anesthesia Preprocedure Evaluation (Signed)
Anesthesia Evaluation  Patient identified by MRN, date of birth, ID band Patient awake    Reviewed: Allergy & Precautions, H&P , NPO status , Patient's Chart, lab work & pertinent test results, reviewed documented beta blocker date and time   Airway Mallampati: II  TM Distance: >3 FB Neck ROM: full    Dental  (+) Teeth Intact   Pulmonary neg pulmonary ROS, shortness of breath and with exertion, asthma , former smoker,    Pulmonary exam normal        Cardiovascular Exercise Tolerance: Poor hypertension, + angina with exertion + CAD and + Peripheral Vascular Disease  negative cardio ROS Normal cardiovascular exam+ Valvular Problems/Murmurs  Rate:Normal     Neuro/Psych  Headaches, PSYCHIATRIC DISORDERS Anxiety Depression  Neuromuscular disease negative neurological ROS  negative psych ROS   GI/Hepatic negative GI ROS, Neg liver ROS, hiatal hernia, PUD, GERD  Medicated,  Endo/Other  negative endocrine ROS  Renal/GU CRFRenal diseasenegative Renal ROS  negative genitourinary   Musculoskeletal   Abdominal   Peds  Hematology negative hematology ROS (+)   Anesthesia Other Findings   Reproductive/Obstetrics negative OB ROS                             Anesthesia Physical Anesthesia Plan  ASA: IV and emergent  Anesthesia Plan: General LMA   Post-op Pain Management:    Induction:   PONV Risk Score and Plan: 4 or greater  Airway Management Planned:   Additional Equipment:   Intra-op Plan:   Post-operative Plan:   Informed Consent: I have reviewed the patients History and Physical, chart, labs and discussed the procedure including the risks, benefits and alternatives for the proposed anesthesia with the patient or authorized representative who has indicated his/her understanding and acceptance.     Plan Discussed with: CRNA  Anesthesia Plan Comments: (Pt is sickly and at moderate to  high risk for any anesthesia.  Pt with recent drop in Hb. Necessitating above procedure.  JA)        Anesthesia Quick Evaluation

## 2018-02-17 NOTE — Progress Notes (Signed)
HGB stable at 7.6 since yesterday. No bowel movements since hemorrhoidal banding yesterday afternoon. BP stable. Off Eliquis at present. Renal function stable.  ECG in ED: Atrial fibrillation. Patient is at risk for stroke of anticoagulation, and for recurrent rectal bleeding with her hemorrhoids on anticoagulation. She had a bleeding episode 12 days after the rubber band ligation, long after I would have expected vessel closure.  Bleeding could have been from another source (diverticular), but time line of cessation speaks to anorectal source.   Will complete an exam under anesthesia this afternoon and consider hemorrhoidectomy vs stapled hemorrhoidectomy.  Patient will need to remain OFF anticoagulation (Eliquis,etc) for at least 1-2 weeks, but consideration could be give to bridging therapy to lower the risk of embolic stroke.

## 2018-02-17 NOTE — Care Management Obs Status (Signed)
[place] NOTIFICATION   Patient Details  Name: Charlotte Wagner, Dr. MRN: [id number] Date of Birth: Oct 10, 1935   Medicare Observation Status Notification Given:  Georga Bora, RN 02/17/2018, 12:28 PM

## 2018-02-17 NOTE — Progress Notes (Signed)
[facility] [place] NAME: Charlotte Wagner    MR#:  [id number]  DATE OF BIRTH:  [birth year]  SUBJECTIVE:  CHIEF COMPLAINT:   Chief Complaint  Patient presents with  . Rectal Bleeding   Came with rectal bleed on anticoagulation. Her bleeding stopped yesterday after trying banding by Dr. Bary Castilla. Hb dropped significantly till evening, but stable since the, denies any BM or bleed since last evening. REVIEW OF SYSTEMS:  CONSTITUTIONAL: No fever, positive for fatigue or weakness.  EYES: No blurred or double vision.  EARS, NOSE, AND THROAT: No tinnitus or ear pain.  RESPIRATORY: No cough, shortness of breath, wheezing or hemoptysis.  CARDIOVASCULAR: No chest pain, orthopnea, edema.  GASTROINTESTINAL: No nausea, vomiting, diarrhea or abdominal pain.  GENITOURINARY: No dysuria, hematuria.  ENDOCRINE: No polyuria, nocturia,  HEMATOLOGY: No anemia, easy bruising or bleeding SKIN: No rash or lesion. MUSCULOSKELETAL: No joint pain or arthritis.   NEUROLOGIC: No tingling, numbness, weakness.  PSYCHIATRY: No anxiety or depression.   ROS  DRUG ALLERGIES:   Allergies  Allergen Reactions  . Beta Adrenergic Blockers Shortness Of Breath    DEPRESSION/HYPOTENSION  . Brilinta [Ticagrelor] Shortness Of Breath    Caused AFIB  . Albuterol     rigors  . Aspirin Other (See Comments)    Heartburn   . Nsaids Other (See Comments)    ULCER HISTORY & CURRENTLY ON BLOOD THINNERS  . Penicillins Hives and Other (See Comments)    Has patient had a PCN reaction causing immediate rash, facial/tongue/throat swelling, SOB or lightheadedness with hypotension: No Has patient had a PCN reaction causing severe rash involving mucus membranes or skin necrosis: No Has patient had a PCN reaction that required hospitalization No Has patient had a PCN reaction occurring within the last 10 years: No If all of the above answers are "NO", then may proceed with Cephalosporin use.    . Statins Other (See Comments)    Muscle weakness  . Demeclocycline Rash  . Sulfa Antibiotics Rash    At mouth  . Sulfasalazine Rash    At mouth  . Tetracycline Rash  . Tetracyclines & Related Rash    VITALS:  Blood pressure 118/67, pulse 70, temperature 98.7 F (37.1 C), temperature source Oral, resp. rate 16, height 5\' 3"  (1.6 m), weight 83.5 kg (184 lb), SpO2 96 %.  PHYSICAL EXAMINATION:  GENERAL:  82 y.o.-year-old patient lying in the bed with no acute distress.  EYES: Pupils equal, round, reactive to light and accommodation. No scleral icterus. Extraocular muscles intact.  HEENT: Head atraumatic, normocephalic. Oropharynx and nasopharynx clear.  NECK:  Supple, no jugular venous distention. No thyroid enlargement, no tenderness.  LUNGS: Normal breath sounds bilaterally, no wheezing, rales,rhonchi or crepitation. No use of accessory muscles of respiration.  CARDIOVASCULAR: S1, S2 normal. No murmurs, rubs, or gallops.  ABDOMEN: Soft, nontender, nondistended. Bowel sounds present. No organomegaly or mass.  EXTREMITIES: No pedal edema, cyanosis, or clubbing.  NEUROLOGIC: Cranial nerves II through XII are intact. Muscle strength 5/5 in all extremities. Sensation intact. Gait not checked.  PSYCHIATRIC: The patient is alert and oriented x 3.  SKIN: No obvious rash, lesion, or ulcer.   Physical Exam LABORATORY PANEL:   CBC Recent Labs  Lab 02/17/18 0529 02/17/18 1403  WBC 3.6  --   HGB 7.6* 7.4*  HCT 22.0*  --   PLT 174  --    ------------------------------------------------------------------------------------------------------------------  Chemistries  Recent Labs  Lab 02/16/18 0550 02/17/18  0529  NA 132* 131*  K 4.3 4.1  CL 99* 101  CO2 27 24  GLUCOSE 120* 116*  BUN 17 18  CREATININE 0.82 0.87  CALCIUM 9.5 8.4*  AST 29  --   ALT 17  --   ALKPHOS 64  --   BILITOT 0.8  --     ------------------------------------------------------------------------------------------------------------------  Cardiac Enzymes Recent Labs  Lab 02/16/18 0550  TROPONINI <0.03   ------------------------------------------------------------------------------------------------------------------  RADIOLOGY:  Dg Chest 1 View  Result Date: 02/17/2018 CLINICAL DATA:  Hypoxia EXAM: CHEST  1 VIEW COMPARISON:  10/08/2016 FINDINGS: Prior CABG. Heart is upper limits normal in size. Lungs are clear. No effusions or acute bony abnormality. IMPRESSION: No active disease. Electronically Signed   By: Rolm Baptise M.D.   On: 02/17/2018 08:27    ASSESSMENT AND PLAN:   Principal Problem:   GI bleed  * GI bleed   Hemoglobin is dropping, but stable since last night and no new bleed, continue monitoring.   Will transfuse if less than 7 or patient becomes symptomatic.   Pt said, she does not want to have transfusion until absolutely needed.  * Lower GI bleed, hemorrhoidal bleed   Patient had hemorrhoidal banding done 12 days ago.   Currently we will hold anticoagulation and continue monitoring.   Dr.Byrnett to doexam under anesthesia today and may do hemorrhoidectomy.  * Atrial fibrillation   Continue verapamil, hold anticoagulation.  * Hypertension   Continue spironolactone, ramipril. Monitor for GI bleed.   All the records are reviewed and case discussed with Care Management/Social Workerr. Management plans discussed with the patient, family and they are in agreement.  CODE STATUS: DNR  TOTAL TIME TAKING CARE OF THIS PATIENT: 35 minutes.     POSSIBLE D/C IN 1-2 DAYS, DEPENDING ON CLINICAL CONDITION.   Vaughan Basta M.D on 02/17/2018   Between 7am to 6pm - Pager - [phone]  After 6pm go to [email or web address] - password [email or web address] [facility]  Office  [phone]  CC: Primary care physician; [name], MD  Note: This dictation was  prepared with Dragon dictation along with smaller phrase technology. Any transcriptional errors that result from this process are unintentional.

## 2018-02-18 ENCOUNTER — Encounter: Payer: Self-pay | Admitting: General Surgery

## 2018-02-18 ENCOUNTER — Telehealth: Payer: Self-pay | Admitting: Internal Medicine

## 2018-02-18 DIAGNOSIS — M353 Polymyalgia rheumatica: Secondary | ICD-10-CM | POA: Diagnosis present

## 2018-02-18 DIAGNOSIS — M4800 Spinal stenosis, site unspecified: Secondary | ICD-10-CM | POA: Diagnosis present

## 2018-02-18 DIAGNOSIS — I2510 Atherosclerotic heart disease of native coronary artery without angina pectoris: Secondary | ICD-10-CM | POA: Diagnosis present

## 2018-02-18 DIAGNOSIS — M1990 Unspecified osteoarthritis, unspecified site: Secondary | ICD-10-CM | POA: Diagnosis present

## 2018-02-18 DIAGNOSIS — K625 Hemorrhage of anus and rectum: Secondary | ICD-10-CM | POA: Diagnosis not present

## 2018-02-18 DIAGNOSIS — I7300 Raynaud's syndrome without gangrene: Secondary | ICD-10-CM | POA: Diagnosis present

## 2018-02-18 DIAGNOSIS — I1310 Hypertensive heart and chronic kidney disease without heart failure, with stage 1 through stage 4 chronic kidney disease, or unspecified chronic kidney disease: Secondary | ICD-10-CM | POA: Diagnosis present

## 2018-02-18 DIAGNOSIS — I340 Nonrheumatic mitral (valve) insufficiency: Secondary | ICD-10-CM | POA: Diagnosis present

## 2018-02-18 DIAGNOSIS — I4891 Unspecified atrial fibrillation: Secondary | ICD-10-CM | POA: Diagnosis present

## 2018-02-18 DIAGNOSIS — F329 Major depressive disorder, single episode, unspecified: Secondary | ICD-10-CM | POA: Diagnosis present

## 2018-02-18 DIAGNOSIS — K219 Gastro-esophageal reflux disease without esophagitis: Secondary | ICD-10-CM | POA: Diagnosis present

## 2018-02-18 DIAGNOSIS — Z66 Do not resuscitate: Secondary | ICD-10-CM | POA: Diagnosis present

## 2018-02-18 DIAGNOSIS — I10 Essential (primary) hypertension: Secondary | ICD-10-CM | POA: Diagnosis not present

## 2018-02-18 DIAGNOSIS — Z7901 Long term (current) use of anticoagulants: Secondary | ICD-10-CM | POA: Diagnosis not present

## 2018-02-18 DIAGNOSIS — K922 Gastrointestinal hemorrhage, unspecified: Secondary | ICD-10-CM | POA: Diagnosis not present

## 2018-02-18 DIAGNOSIS — E785 Hyperlipidemia, unspecified: Secondary | ICD-10-CM | POA: Diagnosis present

## 2018-02-18 DIAGNOSIS — N189 Chronic kidney disease, unspecified: Secondary | ICD-10-CM | POA: Diagnosis present

## 2018-02-18 DIAGNOSIS — Z951 Presence of aortocoronary bypass graft: Secondary | ICD-10-CM | POA: Diagnosis not present

## 2018-02-18 DIAGNOSIS — J45909 Unspecified asthma, uncomplicated: Secondary | ICD-10-CM | POA: Diagnosis present

## 2018-02-18 DIAGNOSIS — G709 Myoneural disorder, unspecified: Secondary | ICD-10-CM | POA: Diagnosis present

## 2018-02-18 DIAGNOSIS — F419 Anxiety disorder, unspecified: Secondary | ICD-10-CM | POA: Diagnosis present

## 2018-02-18 DIAGNOSIS — I509 Heart failure, unspecified: Secondary | ICD-10-CM | POA: Diagnosis not present

## 2018-02-18 DIAGNOSIS — I739 Peripheral vascular disease, unspecified: Secondary | ICD-10-CM | POA: Diagnosis present

## 2018-02-18 DIAGNOSIS — D62 Acute posthemorrhagic anemia: Secondary | ICD-10-CM | POA: Diagnosis not present

## 2018-02-18 DIAGNOSIS — E559 Vitamin D deficiency, unspecified: Secondary | ICD-10-CM | POA: Diagnosis present

## 2018-02-18 DIAGNOSIS — Z8679 Personal history of other diseases of the circulatory system: Secondary | ICD-10-CM | POA: Diagnosis not present

## 2018-02-18 DIAGNOSIS — Z96642 Presence of left artificial hip joint: Secondary | ICD-10-CM | POA: Diagnosis present

## 2018-02-18 DIAGNOSIS — Z96653 Presence of artificial knee joint, bilateral: Secondary | ICD-10-CM | POA: Diagnosis present

## 2018-02-18 DIAGNOSIS — K649 Unspecified hemorrhoids: Secondary | ICD-10-CM | POA: Diagnosis present

## 2018-02-18 LAB — CBC
HEMATOCRIT: 21 % — AB (ref 35.0–47.0)
Hemoglobin: 7.2 g/dL — ABNORMAL LOW (ref 12.0–16.0)
MCH: 31.7 pg (ref 26.0–34.0)
MCHC: 34.6 g/dL (ref 32.0–36.0)
MCV: 91.7 fL (ref 80.0–100.0)
Platelets: 156 10*3/uL (ref 150–440)
RBC: 2.29 MIL/uL — ABNORMAL LOW (ref 3.80–5.20)
RDW: 15.8 % — AB (ref 11.5–14.5)
WBC: 5.8 10*3/uL (ref 3.6–11.0)

## 2018-02-18 MED ORDER — FERROUS SULFATE 325 (65 FE) MG PO TABS
325.0000 mg | ORAL_TABLET | Freq: Two times a day (BID) | ORAL | Status: DC
  Administered 2018-02-18: 325 mg via ORAL
  Filled 2018-02-18: qty 1

## 2018-02-18 MED ORDER — FERROUS SULFATE 325 (65 FE) MG PO TABS: 325.0000 mg | ORAL_TABLET | Freq: Two times a day (BID) | ORAL | 3 refills | Status: DC

## 2018-02-18 NOTE — Anesthesia Postprocedure Evaluation (Signed)
Anesthesia Post Note  Patient: DEJANEE THIBEAUX, Dr.  Jule Ser) Performed: HEMORRHOIDECTOMY (N/A Abdomen)  Patient location during evaluation: PACU Anesthesia Type: General Level of consciousness: awake and alert Pain management: pain level controlled Vital Signs Assessment: post-procedure vital signs reviewed and stable Respiratory status: spontaneous breathing, nonlabored ventilation, respiratory function stable and patient connected to nasal cannula oxygen Cardiovascular status: blood pressure returned to baseline and stable Postop Assessment: no apparent nausea or vomiting Anesthetic complications: no     Last Vitals:  Vitals:   02/17/18 2118 02/17/18 2123  BP: 112/71 112/71  Pulse: (!) 58 68  Resp: 14 16  Temp: 36.7 C 36.7 C  SpO2: 100% 100%    Last Pain:  Vitals:   02/17/18 2123  TempSrc: Oral  PainSc:                  Molli Barrows

## 2018-02-18 NOTE — Progress Notes (Signed)
[name], Dr. to be D/C'd home per MD order.  Discussed prescriptions and follow up appointments with the patient. Prescriptions given to patient, medication list explained in detail. Pt verbalized understanding.  Allergies as of 02/18/2018      Reactions   Beta Adrenergic Blockers Shortness Of Breath   DEPRESSION/HYPOTENSION   Brilinta [ticagrelor] Shortness Of Breath   Caused AFIB   Albuterol    rigors   Aspirin Other (See Comments)   Heartburn    Nsaids Other (See Comments)   ULCER HISTORY & CURRENTLY ON BLOOD THINNERS   Penicillins Hives, Other (See Comments)   Has patient had a PCN reaction causing immediate rash, facial/tongue/throat swelling, SOB or lightheadedness with hypotension: No Has patient had a PCN reaction causing severe rash involving mucus membranes or skin necrosis: No Has patient had a PCN reaction that required hospitalization No Has patient had a PCN reaction occurring within the last 10 years: No If all of the above answers are "NO", then may proceed with Cephalosporin use.   Statins Other (See Comments)   Muscle weakness   Demeclocycline Rash   Sulfa Antibiotics Rash   At mouth   Sulfasalazine Rash   At mouth   Tetracycline Rash   Tetracyclines & Related Rash      Medication List    STOP taking these medications   ELIQUIS 5 MG Tabs tablet Generic drug:  apixaban   furosemide 20 MG tablet Commonly known as:  LASIX   ramipril 10 MG capsule Commonly known as:  ALTACE     TAKE these medications   acetaminophen 500 MG tablet Commonly known as:  TYLENOL Take 1,000 mg by mouth 2 (two) times daily.   ADVAIR DISKUS 250-50 MCG/DOSE Aepb Generic drug:  Fluticasone-Salmeterol inhale 1 puff INTO THE LUNGS twice a day   amitriptyline 25 MG tablet Commonly known as:  ELAVIL take 1 to 2 tablets by mouth at bedtime What changed:    how much to take  how to take this  when to take this   ATROVENT HFA 17 MCG/ACT inhaler Generic drug:   ipratropium inhale 2 puffs INTO THE LUNGS every 6 hours if needed for wheezing   b complex vitamins tablet Take 1 tablet by mouth daily.   CoQ-10 200 MG Caps Take 200 mg by mouth daily.   ezetimibe 10 MG tablet Commonly known as:  ZETIA TAKE 1 TABLET BY MOUTH ONCE DAILY   ferrous sulfate 325 (65 FE) MG tablet Take 1 tablet (325 mg total) by mouth 2 (two) times daily with a meal.   Fish Oil 1000 MG Caps Take 1,000 mg by mouth daily.   folic acid 1 MG tablet Commonly known as:  FOLVITE Take 1 tablet (1 mg total) by mouth daily.   HAIR/SKIN/NAILS PO Take 1 tablet by mouth daily.   hydrocortisone 2.5 % cream apply to VULVA twice a day AT 10AM AND 4 PM What changed:  See the new instructions.   ketoconazole 2 % cream Commonly known as:  NIZORAL Apply 1 application topically daily. What changed:    when to take this  reasons to take this   loratadine 10 MG tablet Commonly known as:  CLARITIN Take 10 mg by mouth daily.   meclizine 25 MG tablet Commonly known as:  ANTIVERT Take 25 mg by mouth 3 (three) times daily as needed for dizziness.   miconazole 2 % cream Commonly known as:  MICOTIN Apply 1 application topically at bedtime as needed (  with hydrocortisone for lichen planus).   nitroGLYCERIN 0.4 MG SL tablet Commonly known as:  NITROSTAT Place 1 tablet (0.4 mg total) under the tongue every 5 (five) minutes as needed for chest pain. Place 1 tablet (0.4 mg total) under the tongue every 5 (five) minutes , 3 times as needed for chest pain.   pantoprazole 40 MG tablet Commonly known as:  PROTONIX Take 1 tablet (40 mg total) by mouth 2 (two) times daily.   polyethylene glycol powder powder Commonly known as:  GLYCOLAX/MIRALAX Take 8.5 g by mouth daily.   spironolactone 25 MG tablet Commonly known as:  ALDACTONE Take 1 tablet (25 mg total) by mouth every morning.   traMADol 50 MG tablet Commonly known as:  ULTRAM Take 1 tablet (50 mg total) by mouth daily as  needed. What changed:  reasons to take this   traZODone 150 MG tablet Commonly known as:  DESYREL TAKE 1 AND 2/3 TO 2 TABLETS BY MOUTH AT BEDTIME AS DIRECTED What changed:  See the new instructions.   verapamil 180 MG CR tablet Commonly known as:  CALAN-SR take 1 tablet by mouth twice a day   vitamin B-12 1000 MCG tablet Commonly known as:  CYANOCOBALAMIN Take 1,000 mcg by mouth daily.   vitamin C 500 MG tablet Commonly known as:  ASCORBIC ACID Take 500 mg by mouth at bedtime.   Vitamin D3 2000 units Tabs Take 2,000 Units by mouth 2 (two) times a week.            Durable Medical Equipment  (From admission, onward)        Start     Ordered   02/18/18 1041  For home use only DME Walker  Once    Question:  Patient needs a walker to treat with the following condition  Answer:  Symptomatic anemia   02/18/18 1040      Vitals:   02/18/18 0830 02/18/18 1307  BP: 118/67 (!) 101/55  Pulse: 86 86  Resp:    Temp: 98.1 F (36.7 C)   SpO2: 92% 95%    Skin clean, dry and intact without evidence of skin break down, no evidence of skin tears noted. IV catheter discontinued intact. Site without signs and symptoms of complications. Dressing and pressure applied. Pt denies pain at this time. No complaints noted.  An After Visit Summary was printed and given to the patient. Patient escorted via [place], and D/C home via private auto.  Shah Insley A

## 2018-02-18 NOTE — Progress Notes (Signed)
Patient had been visited by the [facility] chaplain earlier today. The patient communicates easily and technically about her condition. Chaplain listened actively and offered to return as needed.

## 2018-02-18 NOTE — Progress Notes (Signed)
Comfortable. Small amount of spotting on peripad reported. Minimal pain. HGB: 7.2. BP slightly low. No CP. Will ambulate this AM, if she does well I see no problem with D/C home. Patient has been on bed rest x 48 hours, and may have some component of deconditioning.  Discussed anti-coagulation with Dr. Rockey Situ yesterday:  Bridging therapy w/ Lovenox not required. Will hold Eliquis until office f/u next week.

## 2018-02-18 NOTE — Care Management (Signed)
Patient to discharge home today.  Order had been placed for [facility] PT and RW.  Patient declined [facility] and states that she rather use the outpatient PT on the campus at [facility].  Patient already has a RW.  Patient is from [place], and is requesting to use her respite days at [facility].  CSW has notified Seth Bake at [facility], and after discharge patient will be brought in under respite.  RNCM signing off.
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
## 2018-02-19 ENCOUNTER — Telehealth: Payer: Self-pay | Admitting: Internal Medicine

## 2018-02-19 NOTE — Telephone Encounter (Signed)
It sounds like she needs to be in SNF.  Please let me know what I need to do.

## 2018-02-19 NOTE — Telephone Encounter (Signed)
Thank you.  Do you mind touching base with her tomorrow and seeing if everything ok.

## 2018-02-19 NOTE — Telephone Encounter (Signed)
Please advise 

## 2018-02-19 NOTE — Telephone Encounter (Signed)
Patient stated she tried to call this morning, to let us know she has received the order for PT that she needed and will be starting on Saturday when she returns to independent living. Patient stated she is fine with respite care for 3 days and feels she can return home on Saturday.  Advised patient to let us know by tomorrow if she feels she need skilled nursing patient stated she would but is confident she will not need killed nursing.  Patient stated she will follow with Dr. Bary Castilla HFU and see PCP 03/18/18

## 2018-02-19 NOTE — Telephone Encounter (Signed)
Please advise whether patient needs appointment with [place] or if you would like for me to call [facility].

## 2018-02-20 ENCOUNTER — Ambulatory Visit: Payer: Self-pay

## 2018-02-20 DIAGNOSIS — R0602 Shortness of breath: Secondary | ICD-10-CM | POA: Diagnosis not present

## 2018-02-20 NOTE — Telephone Encounter (Signed)
Please advise 

## 2018-02-20 NOTE — Addendum Note (Signed)
Addended by: Corky Sox E on: 02/20/2018 05:39 PM   Modules accepted: Level of Service, SmartSet

## 2018-02-20 NOTE — Telephone Encounter (Signed)
Tried too reach patient by phone to get update on how patient is feeling , see if patient feels confident about return to independent living. PEC nurse may advise patient.

## 2018-02-20 NOTE — Telephone Encounter (Signed)
Pt does not feel like she can return to independent living at this time. She stated she is concerned why she is so SOB. She stated that her SOB is worse with activity.  She plans on pay $284.00 per night to get the care she feels she needs. She is requesting a pulmonary function test to determine why she is still so SOB. She is wanting it to be a close as possible to [place].

## 2018-02-20 NOTE — Telephone Encounter (Signed)
This encounter was created in error - please disregard.

## 2018-02-21 LAB — SURGICAL PATHOLOGY

## 2018-02-21 NOTE — Telephone Encounter (Signed)
Thank you.  Let me know if I need to do anything.  (I am out of office today and Monday).  Will not be accessing my computer soon.   See staff note.

## 2018-02-21 NOTE — Telephone Encounter (Signed)
Talked with patient says has been worked out because she is getting PT Medicare will pay for patient to stay in respite care until Monday. Patient says she would like to discuss with PCP seeing pulmonology if SOB does not improve with hemoglobin. Patient is going to request Dr. Silvio Pate pa to test hemoglobin level again today to make sure it is improving.

## 2018-02-22 DIAGNOSIS — D6489 Other specified anemias: Secondary | ICD-10-CM | POA: Diagnosis not present

## 2018-02-22 DIAGNOSIS — M6281 Muscle weakness (generalized): Secondary | ICD-10-CM | POA: Diagnosis not present

## 2018-02-22 DIAGNOSIS — R2689 Other abnormalities of gait and mobility: Secondary | ICD-10-CM | POA: Diagnosis not present

## 2018-02-22 DIAGNOSIS — K922 Gastrointestinal hemorrhage, unspecified: Secondary | ICD-10-CM | POA: Diagnosis not present

## 2018-02-22 DIAGNOSIS — R2681 Unsteadiness on feet: Secondary | ICD-10-CM | POA: Diagnosis not present

## 2018-02-22 NOTE — Discharge Summary (Signed)
[facility] NAME: Charlotte Wagner    MR#:  [id number]  DATE OF BIRTH:  [birth year]  DATE OF ADMISSION:  02/16/2018 ADMITTING PHYSICIAN: Vaughan Basta, MD  DATE OF DISCHARGE: 02/18/2018  3:17 PM  PRIMARY CARE PHYSICIAN: [name], MD    ADMISSION DIAGNOSIS:  Diverticulosis [K57.90] Rectal bleeding [K62.5]  DISCHARGE DIAGNOSIS:  Principal Problem:   GI bleed   SECONDARY DIAGNOSIS:   Past Medical History:  Diagnosis Date  . Allergy   . Arthritis    s/p bilateral knees and left hip replacement  . Asthma   . CAD (coronary artery disease)    a. 12/1996 s/p CABG x4 ([place]);  b. 2005 Pt reports stress test & cath, which revealed patent grafts.  . Cancer ([place])    melanoma right arm  . Carotid arterial disease ([place])    a. 04/2015 Carotid U/S: <50% bilat ICA stenosis.  . Chronic kidney disease   . Colon polyps    H/O  . Depression   . GERD (gastroesophageal reflux disease)    h/o hiatal hernia  . Headache    migraines in past  . Heart murmur    a. 04/2011 Echo: EF 55-60%, bilat atrial enlargement, mild to mod TR.  [name] History of chicken pox   . History of hiatal hernia   . Hx of migraines    rare now  . Hx: UTI (urinary tract infection)   . Hyperlipidemia    a. Statin intolerant -->on zetia.  . Hypertensive heart disease   . Lichen planus   . Melanoma ([place])   . Palpitations    a. rare PVC's and h/o SVT.  [name] PMR (polymyalgia rheumatica) (H[facility])    h/o in setting of crestor usage.  [name] PSVT (paroxysmal supraventricular tachycardia) ([place])   . PUD (peptic ulcer disease)    remote history  . Raynaud's phenomenon   . Spinal stenosis   . Urine incontinence    H/O  . Vitamin D deficiency     HOSPITAL COURSE:    *GI bleed Hemoglobin is dropping, but stable since 36 and no new bleed, continue monitoring. Will transfuse if less than 7 or patient becomes symptomatic.   Pt said, she does not  want to have transfusion until absolutely needed.  *Lower GI bleed,hemorrhoidal bleed Patient had hemorrhoidal banding done 12 days ago. Currently we will hold anticoagulation and continue monitoring.  Dr.Byrnett did hemorhoidectomy and pt remaind stable.    *Atrial fibrillation Continue verapamil, hold anticoagulation.  Dr. Fleet Contras suggest to hold anticoagulant till next week and he confirmed with Dr. Rockey Situ.  *Hypertension Continue spironolactone, ramipril.Monitor for GI bleed.    DISCHARGE CONDITIONS:   Stable.  CONSULTS OBTAINED:  Treatment Team:  Robert Bellow, MD  DRUG ALLERGIES:   Allergies  Allergen Reactions  . Beta Adrenergic Blockers Shortness Of Breath    DEPRESSION/HYPOTENSION  . Brilinta [Ticagrelor] Shortness Of Breath    Caused AFIB  . Albuterol     rigors  . Aspirin Other (See Comments)    Heartburn   . Nsaids Other (See Comments)    ULCER HISTORY & CURRENTLY ON BLOOD THINNERS  . Penicillins Hives and Other (See Comments)    Has patient had a PCN reaction causing immediate rash, facial/tongue/throat swelling, SOB or lightheadedness with hypotension: No Has patient had a PCN reaction causing severe rash involving mucus membranes or skin necrosis: No Has patient had a PCN reaction that required hospitalization  No Has patient had a PCN reaction occurring within the last 10 years: No If all of the above answers are "NO", then may proceed with Cephalosporin use.   . Statins Other (See Comments)    Muscle weakness  . Demeclocycline Rash  . Sulfa Antibiotics Rash    At mouth  . Sulfasalazine Rash    At mouth  . Tetracycline Rash  . Tetracyclines & Related Rash    DISCHARGE MEDICATIONS:   Allergies as of 02/18/2018      Reactions   Beta Adrenergic Blockers Shortness Of Breath   DEPRESSION/HYPOTENSION   Brilinta [ticagrelor] Shortness Of Breath   Caused AFIB   Albuterol    rigors   Aspirin Other (See Comments)    Heartburn    Nsaids Other (See Comments)   ULCER HISTORY & CURRENTLY ON BLOOD THINNERS   Penicillins Hives, Other (See Comments)   Has patient had a PCN reaction causing immediate rash, facial/tongue/throat swelling, SOB or lightheadedness with hypotension: No Has patient had a PCN reaction causing severe rash involving mucus membranes or skin necrosis: No Has patient had a PCN reaction that required hospitalization No Has patient had a PCN reaction occurring within the last 10 years: No If all of the above answers are "NO", then may proceed with Cephalosporin use.   Statins Other (See Comments)   Muscle weakness   Demeclocycline Rash   Sulfa Antibiotics Rash   At mouth   Sulfasalazine Rash   At mouth   Tetracycline Rash   Tetracyclines & Related Rash      Medication List    STOP taking these medications   ELIQUIS 5 MG Tabs tablet Generic drug:  apixaban   furosemide 20 MG tablet Commonly known as:  LASIX   ramipril 10 MG capsule Commonly known as:  ALTACE     TAKE these medications   acetaminophen 500 MG tablet Commonly known as:  TYLENOL Take 1,000 mg by mouth 2 (two) times daily.   ADVAIR DISKUS 250-50 MCG/DOSE Aepb Generic drug:  Fluticasone-Salmeterol inhale 1 puff INTO THE LUNGS twice a day   amitriptyline 25 MG tablet Commonly known as:  ELAVIL take 1 to 2 tablets by mouth at bedtime What changed:    how much to take  how to take this  when to take this   ATROVENT HFA 17 MCG/ACT inhaler Generic drug:  ipratropium inhale 2 puffs INTO THE LUNGS every 6 hours if needed for wheezing   b complex vitamins tablet Take 1 tablet by mouth daily.   CoQ-10 200 MG Caps Take 200 mg by mouth daily.   ezetimibe 10 MG tablet Commonly known as:  ZETIA TAKE 1 TABLET BY MOUTH ONCE DAILY   ferrous sulfate 325 (65 FE) MG tablet Take 1 tablet (325 mg total) by mouth 2 (two) times daily with a meal.   Fish Oil 1000 MG Caps Take 1,000 mg by mouth daily.   folic  acid 1 MG tablet Commonly known as:  FOLVITE Take 1 tablet (1 mg total) by mouth daily.   HAIR/SKIN/NAILS PO Take 1 tablet by mouth daily.   hydrocortisone 2.5 % cream apply to VULVA twice a day AT 10AM AND 4 PM What changed:  See the new instructions.   ketoconazole 2 % cream Commonly known as:  NIZORAL Apply 1 application topically daily. What changed:    when to take this  reasons to take this   loratadine 10 MG tablet Commonly known as:  CLARITIN Take 10  mg by mouth daily.   meclizine 25 MG tablet Commonly known as:  ANTIVERT Take 25 mg by mouth 3 (three) times daily as needed for dizziness.   miconazole 2 % cream Commonly known as:  MICOTIN Apply 1 application topically at bedtime as needed (with hydrocortisone for lichen planus).   nitroGLYCERIN 0.4 MG SL tablet Commonly known as:  NITROSTAT Place 1 tablet (0.4 mg total) under the tongue every 5 (five) minutes as needed for chest pain. Place 1 tablet (0.4 mg total) under the tongue every 5 (five) minutes , 3 times as needed for chest pain.   pantoprazole 40 MG tablet Commonly known as:  PROTONIX Take 1 tablet (40 mg total) by mouth 2 (two) times daily.   polyethylene glycol powder powder Commonly known as:  GLYCOLAX/MIRALAX Take 8.5 g by mouth daily.   spironolactone 25 MG tablet Commonly known as:  ALDACTONE Take 1 tablet (25 mg total) by mouth every morning.   traMADol 50 MG tablet Commonly known as:  ULTRAM Take 1 tablet (50 mg total) by mouth daily as needed. What changed:  reasons to take this   traZODone 150 MG tablet Commonly known as:  DESYREL TAKE 1 AND 2/3 TO 2 TABLETS BY MOUTH AT BEDTIME AS DIRECTED What changed:  See the new instructions.   verapamil 180 MG CR tablet Commonly known as:  CALAN-SR take 1 tablet by mouth twice a day   vitamin B-12 1000 MCG tablet Commonly known as:  CYANOCOBALAMIN Take 1,000 mcg by mouth daily.   vitamin C 500 MG tablet Commonly known as:  ASCORBIC  ACID Take 500 mg by mouth at bedtime.   Vitamin D3 2000 units Tabs Take 2,000 Units by mouth 2 (two) times a week.        DISCHARGE INSTRUCTIONS:    Follow with PMD in 1-2 weeks.  If you experience worsening of your admission symptoms, develop shortness of breath, life threatening emergency, suicidal or homicidal thoughts you must seek medical attention immediately by calling 911 or calling your MD immediately  if symptoms less severe.  You Must read complete instructions/literature along with all the possible adverse reactions/side effects for all the Medicines you take and that have been prescribed to you. Take any new Medicines after you have completely understood and accept all the possible adverse reactions/side effects.   Please note  You were cared for by a hospitalist during your hospital stay. If you have any questions about your discharge medications or the care you received while you were in the hospital after you are discharged, you can call the unit and asked to speak with the hospitalist on call if the hospitalist that took care of you is not available. Once you are discharged, your primary care physician will handle any further medical issues. Please note that NO REFILLS for any discharge medications will be authorized once you are discharged, as it is imperative that you return to your primary care physician (or establish a relationship with a primary care physician if you do not have one) for your aftercare needs so that they can reassess your need for medications and monitor your lab values.    Today   CHIEF COMPLAINT:   Chief Complaint  Patient presents with  . Rectal Bleeding    HISTORY OF PRESENT ILLNESS:  Charlotte Wagner  is a 82 y.o. female with a known history of arthritis, CAD, asthma, melanoma, CKD, depression, GERD, atrial fibrillation, heart murmur with ejection fraction 55-60%, Reynaud's phenomena, spinal  stenosis, peptic ulcer disease, on oral  anticoagulation and had internal hemorrhoids which where Banded by Dr. Bary Castilla 12 days ago. Last night she woke up to go to the bathroom and noted some fullness in her rectum. And while walking she felt something on her right thigh and leg. When she turned on the light and checked she saw fresh red blood coming out of her rectum and pouring down her thigh and leg. She did not had any abdominal pain or any other symptoms but concerned with this, she decided to come to emergency room. Her hemoglobin was stable on arrival, but because of being on anticoagulation and history of hemorrhoid, she was given to keep on hospitalist service for observation.   VITAL SIGNS:  Blood pressure (!) 101/55, pulse 86, temperature 98.1 F (36.7 C), temperature source Oral, resp. rate 16, height 5\' 3"  (1.6 m), weight 83.5 kg (184 lb), SpO2 95 %.  I/O:  No intake or output data in the 24 hours ending 02/22/18 1444  PHYSICAL EXAMINATION:   GENERAL:  82 y.o.-year-old patient lying in the bed with no acute distress.  EYES: Pupils equal, round, reactive to light and accommodation. No scleral icterus. Extraocular muscles intact.  HEENT: Head atraumatic, normocephalic. Oropharynx and nasopharynx clear.  NECK:  Supple, no jugular venous distention. No thyroid enlargement, no tenderness.  LUNGS: Normal breath sounds bilaterally, no wheezing, rales,rhonchi or crepitation. No use of accessory muscles of respiration.  CARDIOVASCULAR: S1, S2 normal. No murmurs, rubs, or gallops.  ABDOMEN: Soft, nontender, nondistended. Bowel sounds present. No organomegaly or mass.  EXTREMITIES: No pedal edema, cyanosis, or clubbing.  NEUROLOGIC: Cranial nerves II through XII are intact. Muscle strength 5/5 in all extremities. Sensation intact. Gait not checked.  PSYCHIATRIC: The patient is alert and oriented x 3.  SKIN: No obvious rash, lesion, or ulcer.     DATA REVIEW:   CBC Recent Labs  Lab 02/18/18 0526  WBC 5.8  HGB 7.2*  HCT  21.0*  PLT 156    Chemistries  Recent Labs  Lab 02/16/18 0550 02/17/18 0529  NA 132* 131*  K 4.3 4.1  CL 99* 101  CO2 27 24  GLUCOSE 120* 116*  BUN 17 18  CREATININE 0.82 0.87  CALCIUM 9.5 8.4*  AST 29  --   ALT 17  --   ALKPHOS 64  --   BILITOT 0.8  --     Cardiac Enzymes Recent Labs  Lab 02/16/18 0550  TROPONINI <0.03    Microbiology Results  Results for orders placed or performed during the hospital encounter of 10/08/16  MRSA PCR Screening     Status: None   Collection Time: 10/08/16 10:24 PM  Result Value Ref Range Status   MRSA by PCR NEGATIVE NEGATIVE Final    Comment:        The GeneXpert MRSA Assay (FDA approved for NASAL specimens only), is one component of a comprehensive MRSA colonization surveillance program. It is not intended to diagnose MRSA infection nor to guide or monitor treatment for MRSA infections.     RADIOLOGY:  No results found.  EKG:   Orders placed or performed during the hospital encounter of 02/16/18  . EKG 12-Lead  . EKG 12-Lead  . ED EKG  . ED EKG      Management plans discussed with the patient, family and they are in agreement.  CODE STATUS:  Code Status History    Date Active Date Inactive Code Status Order ID Comments User Context  02/16/2018 1039 02/18/2018 1823 DNR [id number]  Vaughan Basta, MD Inpatient   03/12/2017 2113 03/13/2017 1834 Full Code [id number]  Gladstone Lighter, MD Inpatient   10/08/2016 1834 10/10/2016 1526 Full Code [id number]  [name], MD ED   06/07/2016 1022 06/07/2016 1724 Full Code [id number]  Nicholes Mango, MD Inpatient   06/06/2016 2211 06/07/2016 1021 DNR [id number]  Max Sane, MD ED   06/01/2016 0019 06/01/2016 1356 Full Code [id number]  Lance Coon, MD Inpatient    Questions for Most Recent Historical Code Status (Order [id number])    Question Answer Comment   In the event of cardiac or respiratory ARREST Do not call a "code blue"    In the event of cardiac or respiratory  ARREST Do not perform Intubation, CPR, defibrillation or ACLS    In the event of cardiac or respiratory ARREST Use medication by any route, position, wound care, and other measures to relive pain and suffering. May use oxygen, suction and manual treatment of airway obstruction as needed for comfort.         Advance Directive Documentation     Most Recent Value  Type of Advance Directive  Healthcare Power of Attorney, Living will, Out of facility DNR (pink MOST or yellow form)  Pre-existing out of facility DNR order (yellow form or pink MOST form)  -- [Pt states she did not bring her DNR form with her]  "MOST" Form in Place?  -      TOTAL TIME TAKING CARE OF THIS PATIENT: 35 minutes.    Vaughan Basta M.D on 02/22/2018 at 2:44 PM  Between 7am to 6pm - Pager - [phone]  After 6pm go to [email or web address] - password [email or web address] [facility]  Office  [phone]  CC: Primary care physician; [name], MD   Note: This dictation was prepared with Dragon dictation along with smaller phrase technology. Any transcriptional errors that result from this process are unintentional.

## 2018-02-24 ENCOUNTER — Other Ambulatory Visit: Payer: Self-pay | Admitting: Internal Medicine

## 2018-02-24 DIAGNOSIS — D6489 Other specified anemias: Secondary | ICD-10-CM | POA: Diagnosis not present

## 2018-02-24 DIAGNOSIS — R2681 Unsteadiness on feet: Secondary | ICD-10-CM | POA: Diagnosis not present

## 2018-02-24 DIAGNOSIS — K922 Gastrointestinal hemorrhage, unspecified: Secondary | ICD-10-CM | POA: Diagnosis not present

## 2018-02-24 DIAGNOSIS — M6281 Muscle weakness (generalized): Secondary | ICD-10-CM | POA: Diagnosis not present

## 2018-02-24 DIAGNOSIS — R2689 Other abnormalities of gait and mobility: Secondary | ICD-10-CM | POA: Diagnosis not present

## 2018-02-25 ENCOUNTER — Ambulatory Visit (INDEPENDENT_AMBULATORY_CARE_PROVIDER_SITE_OTHER): Payer: Medicare Other | Admitting: General Surgery

## 2018-02-25 ENCOUNTER — Other Ambulatory Visit
Admission: RE | Admit: 2018-02-25 | Discharge: 2018-02-25 | Disposition: A | Discharge status: Home / Self Care | Payer: Medicare Other | Source: Ambulatory Visit | Attending: General Surgery | Admitting: General Surgery

## 2018-02-25 ENCOUNTER — Encounter: Payer: Self-pay | Admitting: General Surgery

## 2018-02-25 VITALS — BP 130/74 | HR 86 | Resp 14 | Ht 64.0 in | Wt 190.0 lb

## 2018-02-25 DIAGNOSIS — K649 Unspecified hemorrhoids: Secondary | ICD-10-CM | POA: Diagnosis not present

## 2018-02-25 DIAGNOSIS — D500 Iron deficiency anemia secondary to blood loss (chronic): Secondary | ICD-10-CM

## 2018-02-25 DIAGNOSIS — K648 Other hemorrhoids: Secondary | ICD-10-CM

## 2018-02-25 LAB — CBC WITH DIFFERENTIAL/PLATELET
BASOS ABS: 0 10*3/uL (ref 0–0.1)
BASOS PCT: 0 %
Eosinophils Absolute: 0 10*3/uL (ref 0–0.7)
Eosinophils Relative: 1 %
HEMATOCRIT: 23.4 % — AB (ref 35.0–47.0)
HEMOGLOBIN: 7.9 g/dL — AB (ref 12.0–16.0)
LYMPHS PCT: 13 %
Lymphs Abs: 0.6 10*3/uL — ABNORMAL LOW (ref 1.0–3.6)
MCH: 31.4 pg (ref 26.0–34.0)
MCHC: 33.6 g/dL (ref 32.0–36.0)
MCV: 93.3 fL (ref 80.0–100.0)
Monocytes Absolute: 0.7 10*3/uL (ref 0.2–0.9)
Monocytes Relative: 14 %
NEUTROS ABS: 3.4 10*3/uL (ref 1.4–6.5)
NEUTROS PCT: 72 %
PLATELETS: 311 10*3/uL (ref 150–440)
RBC: 2.51 MIL/uL — AB (ref 3.80–5.20)
RDW: 17.5 % — ABNORMAL HIGH (ref 11.5–14.5)
WBC: 4.7 10*3/uL (ref 3.6–11.0)

## 2018-02-25 LAB — BASIC METABOLIC PANEL
Anion gap: 11 (ref 5–15)
BUN: 12 mg/dL (ref 6–20)
CHLORIDE: 96 mmol/L — AB (ref 101–111)
CO2: 23 mmol/L (ref 22–32)
CREATININE: 0.81 mg/dL (ref 0.44–1.00)
Calcium: 9 mg/dL (ref 8.9–10.3)
GFR calc Af Amer: >60 mL/min (ref >60)
GLUCOSE: 105 mg/dL — AB (ref 65–99)
POTASSIUM: 4.3 mmol/L (ref 3.5–5.1)
SODIUM: 130 mmol/L — AB (ref 135–145)

## 2018-02-25 NOTE — Progress Notes (Signed)
Patient ID: Charlotte Wagner, Dr., female   DOB: 1936-07-02, 82 y.o.   MRN: [id number]  Chief Complaint  Patient presents with  . 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 Patient states she is doing well, no bleeding. Moving her bowels.   Past Medical History:  Diagnosis Date  . Allergy   . Arthritis    s/p bilateral knees and left hip replacement  . Asthma   . CAD (coronary artery disease)    a. 12/1996 s/p CABG x4 ([place]);  b. 2005 Pt reports stress test & cath, which revealed patent grafts.  . Cancer ([place])    melanoma right arm  . Carotid arterial disease ([place])    a. 04/2015 Carotid U/S: <50% bilat ICA stenosis.  . Chronic kidney disease   . Colon polyps    H/O  . Depression   . GERD (gastroesophageal reflux disease)    h/o hiatal hernia  . Headache    migraines in past  . Heart murmur    a. 04/2011 Echo: EF 55-60%, bilat atrial enlargement, mild to mod TR.  [name] History of chicken pox   . History of hiatal hernia   . Hx of migraines    rare now  . Hx: UTI (urinary tract infection)   . Hyperlipidemia    a. Statin intolerant -->on zetia.  . Hypertensive heart disease   . Lichen planus   . Melanoma ([place])   . Palpitations    a. rare PVC's and h/o SVT.  [name] PMR (polymyalgia rheumatica) (H[facility])    h/o in setting of crestor usage.  [name] PSVT (paroxysmal supraventricular tachycardia) ([place])   . PUD (peptic ulcer disease)    remote history  . Raynaud's phenomenon   . Spinal stenosis   . Urine incontinence    H/O  . Vitamin D deficiency     Past Surgical History:  Procedure Laterality Date  . ADENOIDECTOMY     age 70  . BACK SURGERY     L3-L5  . BILATERAL CARPAL TUNNEL RELEASE    . BREAST BIOPSY Right    bx x 3 neg  . BREAST SURGERY Right    biopsy x 3 (all benign)  . CARDIAC CATHETERIZATION N/A 06/01/2016   Procedure: LEFT HEART CATH AND CORS/GRAFTS ANGIOGRAPHY;  Surgeon: Wellington Hampshire, MD;  Location: [facility];  Service: Cardiovascular;  Laterality: N/A;  . CARDIAC CATHETERIZATION N/A 06/01/2016   Procedure: Coronary Stent Intervention;  Surgeon: Wellington Hampshire, MD;  Location: [facility];  Service: Cardiovascular;  Laterality: N/A;  . CARDIOVERSION N/A 03/08/2017   Procedure: CARDIOVERSION;  Surgeon: Wellington Hampshire, MD;  Location: [facility];  Service: Cardiovascular;  Laterality: N/A;  . CATARACT EXTRACTION W/PHACO Right 01/04/2016   Procedure: CATARACT EXTRACTION PHACO AND INTRAOCULAR LENS PLACEMENT ([place]);  Surgeon: Leandrew Koyanagi, MD;  Location: [facility];  Service: Ophthalmology;  Laterality: Right;  MALYUGIN  . CATARACT EXTRACTION W/PHACO Left 01/25/2016   Procedure: CATARACT EXTRACTION PHACO AND INTRAOCULAR LENS PLACEMENT ([place]) left eye;  Surgeon: Leandrew Koyanagi, MD;  Location: [facility];  Service: Ophthalmology;  Laterality: Left;  MALYUGIN SHUGARCAINE  . CHOLECYSTECTOMY  90's  . COLONOSCOPY WITH PROPOFOL N/A 05/03/2015   Procedure: COLONOSCOPY WITH PROPOFOL;  Surgeon: Lollie Sails, MD;  Location: [facility];  Service: Endoscopy;  Laterality: N/A;  . CORONARY ARTERY BYPASS GRAFT  98  4 vessel  . ESOPHAGOGASTRODUODENOSCOPY N/A 03/22/2015   Procedure: ESOPHAGOGASTRODUODENOSCOPY (EGD);  Surgeon: Lollie Sails, MD;  Location: [facility];  Service: Endoscopy;  Laterality: N/A;  . HEMORRHOID BANDING    . HEMORRHOID SURGERY N/A 02/17/2018   Procedure: HEMORRHOIDECTOMY;  Surgeon: Robert Bellow, MD;  Location: [facility];  Service: General;  Laterality: N/A;  . JOINT REPLACEMENT     BILATERAL KNEE REPLACEMENTS  . KNEE ARTHROSCOPY W/ OATS PROCEDURE     Lt knee (9/01), Rt knee (3/11), Lt hip (5/10)  . TOTAL HIP ARTHROPLASTY      Family History  Problem Relation Age of Onset  . Thyroid disease Mother        graves disease  . Heart disease Father        rheumatic heart  . Alcohol abuse Father   .  Depression Father   . Lung cancer Sister   . Depression Daughter   . Thyroid disease Daughter        hashimoto  . Depression Son   . Stroke Maternal Grandmother   . Depression Maternal Grandmother   . Hypertension Maternal Grandfather   . Hypertension Paternal Grandfather   . Seizures Son   . Breast cancer Neg Hx     Social History Social History   Tobacco Use  . Smoking status: Former Research scientist (life sciences)  . Smokeless tobacco: Never Used  . Tobacco comment: quit 44+ yrs ago  Substance Use Topics  . Alcohol use: No    Alcohol/week: 0.0 oz  . Drug use: No    Allergies  Allergen Reactions  . Beta Adrenergic Blockers Shortness Of Breath    DEPRESSION/HYPOTENSION  . Brilinta [Ticagrelor] Shortness Of Breath    Caused AFIB  . Albuterol     rigors  . Aspirin Other (See Comments)    Heartburn   . Nsaids Other (See Comments)    ULCER HISTORY & CURRENTLY ON BLOOD THINNERS  . Penicillins Hives and Other (See Comments)    Has patient had a PCN reaction causing immediate rash, facial/tongue/throat swelling, SOB or lightheadedness with hypotension: No Has patient had a PCN reaction causing severe rash involving mucus membranes or skin necrosis: No Has patient had a PCN reaction that required hospitalization No Has patient had a PCN reaction occurring within the last 10 years: No If all of the above answers are "NO", then may proceed with Cephalosporin use.   . Statins Other (See Comments)    Muscle weakness  . Demeclocycline Rash  . Sulfa Antibiotics Rash    At mouth  . Sulfasalazine Rash    At mouth  . Tetracycline Rash  . Tetracyclines & Related Rash    Current Outpatient Medications  Medication Sig Dispense Refill  . acetaminophen (TYLENOL) 500 MG tablet Take 1,000 mg by mouth 2 (two) times daily.     [name] ADVAIR DISKUS 250-50 MCG/DOSE AEPB INHALE 1 PUFF BY MOUTH TWICE DAILY 14 each 0  . amitriptyline (ELAVIL) 25 MG tablet take 1 to 2 tablets by mouth at bedtime (Patient taking  differently: Take 25 mg by mouth at bedtime) 60 tablet 5  . ATROVENT HFA 17 MCG/ACT inhaler inhale 2 puffs INTO THE LUNGS every 6 hours if needed for wheezing 12.9 g 2  . b complex vitamins tablet Take 1 tablet by mouth daily.    . Cholecalciferol (VITAMIN D3) 2000 UNITS TABS Take 2,000 Units by mouth 2 (two) times a week.     . Coenzyme Q10 (COQ-10) 200 MG CAPS  Take 200 mg by mouth daily.     [name] ezetimibe (ZETIA) 10 MG tablet TAKE 1 TABLET BY MOUTH ONCE DAILY 30 tablet 2  . ferrous sulfate 325 (65 FE) MG tablet Take 1 tablet (325 mg total) by mouth 2 (two) times daily with a meal. 60 tablet 3  . folic acid (FOLVITE) 1 MG tablet Take 1 tablet (1 mg total) by mouth daily. 30 tablet 5  . hydrocortisone 2.5 % cream apply to VULVA twice a day AT 10AM AND 4 PM (Patient taking differently: Apply to VULVA at bedtime as needed with miconazole for lichen planus) 28 g 0  . ketoconazole (NIZORAL) 2 % cream Apply 1 application topically daily. (Patient taking differently: Apply 1 application topically daily as needed (for crack bleeding fingers.). ) 30 g 0  . loratadine (CLARITIN) 10 MG tablet Take 10 mg by mouth daily.    . meclizine (ANTIVERT) 25 MG tablet Take 25 mg by mouth 3 (three) times daily as needed for dizziness.    . miconazole (MICOTIN) 2 % cream Apply 1 application topically at bedtime as needed (with hydrocortisone for lichen planus).     . Multiple Vitamins-Minerals (HAIR/SKIN/NAILS PO) Take 1 tablet by mouth daily.    . nitroGLYCERIN (NITROSTAT) 0.4 MG SL tablet Place 1 tablet (0.4 mg total) under the tongue every 5 (five) minutes as needed for chest pain. Place 1 tablet (0.4 mg total) under the tongue every 5 (five) minutes , 3 times as needed for chest pain. 15 tablet 0  . Omega-3 Fatty Acids (FISH OIL) 1000 MG CAPS Take 1,000 mg by mouth daily.     . pantoprazole (PROTONIX) 40 MG tablet Take 1 tablet (40 mg total) by mouth 2 (two) times daily. 180 tablet 1  . polyethylene glycol powder  (GLYCOLAX/MIRALAX) powder Take 8.5 g by mouth daily.     [name] spironolactone (ALDACTONE) 25 MG tablet Take 1 tablet (25 mg total) by mouth every morning. 90 tablet 1  . traMADol (ULTRAM) 50 MG tablet Take 1 tablet (50 mg total) by mouth daily as needed. (Patient taking differently: Take 50 mg by mouth daily as needed for severe pain. ) 20 tablet 0  . traZODone (DESYREL) 150 MG tablet TAKE 1 AND 2/3 TO 2 TABLETS BY MOUTH AT BEDTIME AS DIRECTED (Patient taking differently: Take 250 mg by mouth at bedtime (1 and 2/3 tablets)) 180 tablet 2  . verapamil (CALAN-SR) 180 MG CR tablet take 1 tablet by mouth twice a day 60 tablet 5  . vitamin B-12 (CYANOCOBALAMIN) 1000 MCG tablet Take 1,000 mcg by mouth daily.    . vitamin C (ASCORBIC ACID) 500 MG tablet Take 500 mg by mouth at bedtime.     No current facility-administered medications for this visit.     Review of Systems Review of Systems  Blood pressure 130/74, pulse 86, resp. rate 14, height 5\' 4"  (1.626 m), weight 190 lb (86.2 kg), SpO2 95 %.  Physical Exam Physical Exam  Genitourinary:       Data Reviewed DIAGNOSIS:  A. ANORECTUM; HEMORRHOIDECTOMY:  - HEMORRHOIDS SURFACED BY SQUAMOUS AND COLUMNAR EPITHELIUM.  - FOCAL ATYPICAL SQUAMOUS METAPLASIA, INFLAMED, POSITIVE FOR P16, SEE  COMMENT.   Comment:  This case underwent intradepartmental review. The area of atypical  squamous metaplasia shows marked active inflammation.  Immunohistochemistry for p16 (CINtec) was performed for further  evaluation, and there is focal strong block positivity. The morphology  and p16 positivity raise concern for a high-grade squamous  intraepithelial lesion (severe dysplasia, AIN 3), related to HPV  infection.   It would be reasonable to pursue this finding with careful gynecologic  exam including vulva and perineum, with sampling for HPV, as clinically  indicated, to be sure there is no more serious process. Follow-up anal  exam is recommended.     At the time of discharge hemoglobin reached a nadir of 7.2. On recheck today had risen to 7.9.  Basic metabolic panel today is notable for a mild hyponatremia.  Renal function is normal.   Assessment    Improved anorectal exam status post hemorrhoidectomy.  Findings of squamous metaplasia.  The patient has not had a GYN exam in over 5 years.  Will determine if she should be seen by gynecology or GYN oncology for formal assessment.    Plan Return in one month.  The patient was provided a copy of her pathology report.     HPI, Physical Exam, Assessment and Plan have been scribed under the direction and in the presence of Hervey Ard, MD.  Gaspar Cola, CMA  I have completed the exam and reviewed the above documentation for accuracy and completeness.  I agree with the above.  Haematologist has been used and any errors in dictation or transcription are unintentional.  Hervey Ard, M.D., F.A.C.S.  Forest Gleason Lexander Tremblay 02/25/2018, 8:45 PM

## 2018-02-25 NOTE — Patient Instructions (Signed)
Return one month.

## 2018-02-26 DIAGNOSIS — R2681 Unsteadiness on feet: Secondary | ICD-10-CM | POA: Diagnosis not present

## 2018-02-26 DIAGNOSIS — D6489 Other specified anemias: Secondary | ICD-10-CM | POA: Diagnosis not present

## 2018-02-26 DIAGNOSIS — K648 Other hemorrhoids: Secondary | ICD-10-CM | POA: Diagnosis not present

## 2018-02-26 DIAGNOSIS — M6281 Muscle weakness (generalized): Secondary | ICD-10-CM | POA: Diagnosis not present

## 2018-03-04 ENCOUNTER — Other Ambulatory Visit: Payer: Self-pay

## 2018-03-04 ENCOUNTER — Ambulatory Visit: Payer: Self-pay | Admitting: *Deleted

## 2018-03-04 ENCOUNTER — Ambulatory Visit: Payer: Medicare Other | Admitting: General Surgery

## 2018-03-04 DIAGNOSIS — K648 Other hemorrhoids: Secondary | ICD-10-CM

## 2018-03-04 NOTE — Telephone Encounter (Signed)
Patient phoned reporting she has had hoarseness periodically since February. It had improved until May. 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 She reports she does not have a problem swallowing solids. She is calling for an ENT referral. No availability with Dr. Nicki Reaper. Scheduled an appointment for tomorrow.  Reason for Disposition . Hoarseness persists > 2 weeks  Answer Assessment - Initial Assessment Questions 1. DESCRIPTION: "Describe your voice."     raspy 2. ONSET: "When did the hoarseness begin?"     Began experiencing laryngitis in February. There was improvement for awhile until May when she was with a friend who had laryngitis. So she began with laryngitis shortly after that.  3. COUGH: "Is there a cough?" If so, ask: "How bad?"     no 4. FEVER: "Do you have a fever?" If so, ask: "What is your temperature, how was it measured, and when did it start?"     no 5. RESPIRATORY STATUS: "Describe your breathing."      normal 6. ALLERGIES: "Any allergy symptoms?" If so, ask: "What are they?"     no 7. IRRITANTS: "Do you smoke?" "Have you been exposed to any irritating fumes?"     no 8. CAUSE: "What do you think is causing the hoarseness?"     Unsure. She is concerned she may have polyps. 9. OTHER SYMPTOMS: "Do you have any other symptoms?" (e.g., sore throat, swelling, foreign body, rash)     She stated she has been "aspirating liquids for approximately one month" 10. PREGNANCY: "Is there any chance you are pregnant?" "When was your last menstrual period?"       no  Protocols used: HOARSENESS-A-AH

## 2018-03-05 ENCOUNTER — Encounter: Payer: Self-pay | Admitting: Internal Medicine

## 2018-03-05 ENCOUNTER — Ambulatory Visit (INDEPENDENT_AMBULATORY_CARE_PROVIDER_SITE_OTHER): Payer: Medicare Other | Admitting: Internal Medicine

## 2018-03-05 VITALS — BP 114/64 | HR 74 | Temp 97.5°F | Ht 64.0 in | Wt 184.0 lb

## 2018-03-05 DIAGNOSIS — K648 Other hemorrhoids: Secondary | ICD-10-CM | POA: Diagnosis not present

## 2018-03-05 DIAGNOSIS — J040 Acute laryngitis: Secondary | ICD-10-CM

## 2018-03-05 DIAGNOSIS — I481 Persistent atrial fibrillation: Secondary | ICD-10-CM | POA: Diagnosis not present

## 2018-03-05 DIAGNOSIS — R2681 Unsteadiness on feet: Secondary | ICD-10-CM | POA: Diagnosis not present

## 2018-03-05 DIAGNOSIS — D6489 Other specified anemias: Secondary | ICD-10-CM | POA: Diagnosis not present

## 2018-03-05 DIAGNOSIS — M6281 Muscle weakness (generalized): Secondary | ICD-10-CM | POA: Diagnosis not present

## 2018-03-05 DIAGNOSIS — I4819 Other persistent atrial fibrillation: Secondary | ICD-10-CM

## 2018-03-05 NOTE — Patient Instructions (Signed)
Hoarseness Hoarseness is any abnormal change in your voice.Hoarseness can make it difficult to speak. Your voice may sound raspy, breathy, or strained. Hoarseness is caused by a problem with the vocal cords. The vocal cords are two bands of tissue inside your voice box (larynx). When you speak, your vocal cords move back and forth to create sound. The surfaces of your vocal cords need to be smooth for your voice to sound clear. Swelling or lumps on the vocal cords can cause hoarseness. Common causes of vocal cord problems include:  Upper airway infection.  A long-term cough.  Straining or overusing your voice.  Smoking.  Allergies.  Vocal cord growths.  Stomach acids that flow up from your stomach and irritate your vocal cords (gastroesophageal reflux).  Follow these instructions at home: Watch your condition for any changes. To ease any discomfort that you feel:  Rest your voice. Do not whisper. Whispering can cause muscle strain.  Do not speak in a loud or harsh voice that makes your hoarseness worse.  Do not use any tobacco products, including cigarettes, chewing tobacco, or electronic cigarettes. If you need help quitting, ask your health care provider.  Avoid secondhand smoke.  Do not eat foods that give you heartburn. Heartburn can make gastroesophageal reflux worse.  Do not drink coffee.  Do not drink alcohol.  Drink enough fluids to keep your urine clear or pale yellow.  Use a humidifier if the air in your home is dry.  Contact a health care provider if:  You have hoarseness that lasts longer than 3 weeks.  You almost lose or completelylose your voice for longer than 3 days.  You have pain when you swallow or try to talk.  You feel a lump in your neck. Get help right away if:  You have trouble swallowing.  You feel as though you are choking when you swallow.  You cough up blood or vomit blood.  You have trouble breathing. This information is not  intended to replace advice given to you by your health care provider. Make sure you discuss any questions you have with your health care provider. Document Released: 09/07/2005 Document Revised: 03/01/2016 Document Reviewed: 09/15/2014 Elsevier Interactive Patient Education  2018 [employer]  Laryngitis Laryngitis is inflammation of your vocal cords. This causes hoarseness, coughing, loss of voice, sore throat, or a dry throat. Your vocal cords are two bands of muscles that are found in your throat. When you speak, these cords come together and vibrate. These vibrations come out through your mouth as sound. When your vocal cords are inflamed, your voice sounds different. Laryngitis can be temporary (acute) or long-term (chronic). Most cases of acute laryngitis improve with time. Chronic laryngitis is laryngitis that lasts for more than three weeks. What are the causes? Acute laryngitis may be caused by:  A viral infection.  Lots of talking, yelling, or singing. This is also called vocal strain.  Bacterial infections.  Chronic laryngitis may be caused by:  Vocal strain.  Injury to your vocal cords.  Acid reflux (gastroesophageal reflux disease or GERD).  Allergies.  Sinus infection.  Smoking.  Alcohol abuse.  Breathing in chemicals or dust.  Growths on the vocal cords.  What increases the risk? Risk factors for laryngitis include:  Smoking.  Alcohol abuse.  Having allergies.  What are the signs or symptoms? Symptoms of laryngitis may include:  Low, hoarse voice.  Loss of voice.  Dry cough.  Sore throat.  Stuffy nose.  How is  this diagnosed? Laryngitis may be diagnosed by:  Physical exam.  Throat culture.  Blood test.  Laryngoscopy. This procedure allows your health care provider to look at your vocal cords with a mirror or viewing tube.  How is this treated? Treatment for laryngitis depends on what is causing it. Usually, treatment involves  resting your voice and using medicines to soothe your throat. However, if your laryngitis is caused by a bacterial infection, you may need to take antibiotic medicine. If your laryngitis is caused by a growth, you may need to have a procedure to remove it. Follow these instructions at home:  Drink enough fluid to keep your urine clear or pale yellow.  Breathe in moist air. Use a humidifier if you live in a dry climate.  Take medicines only as directed by your health care provider.  If you were prescribed an antibiotic medicine, finish it all even if you start to feel better.  Do not smoke cigarettes or electronic cigarettes. If you need help quitting, ask your health care provider.  Talk as little as possible. Also avoid whispering, which can cause vocal strain.  Write instead of talking. Do this until your voice is back to normal. Contact a health care provider if:  You have a fever.  You have increasing pain.  You have difficulty swallowing. Get help right away if:  You cough up blood.  You have trouble breathing. This information is not intended to replace advice given to you by your health care provider. Make sure you discuss any questions you have with your health care provider. Document Released: 09/24/2005 Document Revised: 03/01/2016 Document Reviewed: 03/09/2014 Elsevier Interactive Patient Education  [employer].
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
## 2018-03-05 NOTE — Progress Notes (Signed)
Pre visit review using our clinic review tool, if applicable. No additional management support is needed unless otherwise documented below in the visit note. 

## 2018-03-07 ENCOUNTER — Telehealth: Payer: Self-pay

## 2018-03-07 DIAGNOSIS — K648 Other hemorrhoids: Secondary | ICD-10-CM | POA: Diagnosis not present

## 2018-03-07 DIAGNOSIS — M6281 Muscle weakness (generalized): Secondary | ICD-10-CM | POA: Diagnosis not present

## 2018-03-07 DIAGNOSIS — D6489 Other specified anemias: Secondary | ICD-10-CM | POA: Diagnosis not present

## 2018-03-07 DIAGNOSIS — R2681 Unsteadiness on feet: Secondary | ICD-10-CM | POA: Diagnosis not present

## 2018-03-07 NOTE — Telephone Encounter (Signed)
Signed and placed on your desk.

## 2018-03-07 NOTE — Telephone Encounter (Signed)
Regarding paperwork, patient is taking ramipril but is not taking advair. She stopped ramipril d/t blood pressures being low but they are back to normal now and she has been taking again. Placed paperwork in your folder for signature so I can mail back to [facility].

## 2018-03-07 NOTE — Telephone Encounter (Signed)
Copied from [place] [id number]. Topic: Inquiry >> Mar 07, 2018  3:13 PM Charlotte Wagner wrote: Reason for CRM: pt states Larena Glassman called her from Dr Nicki Reaper office. I do not see any notes. Please call back

## 2018-03-10 DIAGNOSIS — D6489 Other specified anemias: Secondary | ICD-10-CM | POA: Diagnosis not present

## 2018-03-10 DIAGNOSIS — K648 Other hemorrhoids: Secondary | ICD-10-CM | POA: Diagnosis not present

## 2018-03-10 DIAGNOSIS — M6281 Muscle weakness (generalized): Secondary | ICD-10-CM | POA: Diagnosis not present

## 2018-03-10 DIAGNOSIS — R2681 Unsteadiness on feet: Secondary | ICD-10-CM | POA: Diagnosis not present

## 2018-03-10 DIAGNOSIS — R490 Dysphonia: Secondary | ICD-10-CM | POA: Diagnosis not present

## 2018-03-10 DIAGNOSIS — J3801 Paralysis of vocal cords and larynx, unilateral: Secondary | ICD-10-CM | POA: Diagnosis not present

## 2018-03-12 ENCOUNTER — Ambulatory Visit: Payer: Medicare Other

## 2018-03-12 ENCOUNTER — Other Ambulatory Visit: Payer: Self-pay | Admitting: Unknown Physician Specialty

## 2018-03-12 DIAGNOSIS — K648 Other hemorrhoids: Secondary | ICD-10-CM | POA: Diagnosis not present

## 2018-03-12 DIAGNOSIS — J3801 Paralysis of vocal cords and larynx, unilateral: Secondary | ICD-10-CM | POA: Diagnosis not present

## 2018-03-12 DIAGNOSIS — R490 Dysphonia: Secondary | ICD-10-CM

## 2018-03-12 DIAGNOSIS — D6489 Other specified anemias: Secondary | ICD-10-CM | POA: Diagnosis not present

## 2018-03-12 DIAGNOSIS — R498 Other voice and resonance disorders: Secondary | ICD-10-CM

## 2018-03-12 DIAGNOSIS — G8929 Other chronic pain: Secondary | ICD-10-CM

## 2018-03-12 DIAGNOSIS — R05 Cough: Secondary | ICD-10-CM

## 2018-03-12 DIAGNOSIS — R51 Headache: Principal | ICD-10-CM

## 2018-03-12 DIAGNOSIS — R059 Cough, unspecified: Secondary | ICD-10-CM

## 2018-03-12 DIAGNOSIS — J3800 Paralysis of vocal cords and larynx, unspecified: Secondary | ICD-10-CM

## 2018-03-12 DIAGNOSIS — M6281 Muscle weakness (generalized): Secondary | ICD-10-CM | POA: Diagnosis not present

## 2018-03-12 DIAGNOSIS — R2681 Unsteadiness on feet: Secondary | ICD-10-CM | POA: Diagnosis not present

## 2018-03-14 DIAGNOSIS — D6489 Other specified anemias: Secondary | ICD-10-CM | POA: Diagnosis not present

## 2018-03-14 DIAGNOSIS — J3801 Paralysis of vocal cords and larynx, unilateral: Secondary | ICD-10-CM | POA: Diagnosis not present

## 2018-03-14 DIAGNOSIS — R490 Dysphonia: Secondary | ICD-10-CM | POA: Diagnosis not present

## 2018-03-14 DIAGNOSIS — R2681 Unsteadiness on feet: Secondary | ICD-10-CM | POA: Diagnosis not present

## 2018-03-14 DIAGNOSIS — K648 Other hemorrhoids: Secondary | ICD-10-CM | POA: Diagnosis not present

## 2018-03-14 DIAGNOSIS — M6281 Muscle weakness (generalized): Secondary | ICD-10-CM | POA: Diagnosis not present

## 2018-03-18 ENCOUNTER — Ambulatory Visit (INDEPENDENT_AMBULATORY_CARE_PROVIDER_SITE_OTHER): Payer: Medicare Other | Admitting: Internal Medicine

## 2018-03-18 ENCOUNTER — Ambulatory Visit (INDEPENDENT_AMBULATORY_CARE_PROVIDER_SITE_OTHER): Payer: Medicare Other

## 2018-03-18 VITALS — BP 118/64 | Temp 98.4°F | Wt 180.1 lb

## 2018-03-18 VITALS — BP 118/64 | HR 60 | Temp 98.4°F | Resp 15 | Ht 63.0 in | Wt 180.1 lb

## 2018-03-18 DIAGNOSIS — J4520 Mild intermittent asthma, uncomplicated: Secondary | ICD-10-CM | POA: Diagnosis not present

## 2018-03-18 DIAGNOSIS — E871 Hypo-osmolality and hyponatremia: Secondary | ICD-10-CM | POA: Diagnosis not present

## 2018-03-18 DIAGNOSIS — J040 Acute laryngitis: Secondary | ICD-10-CM | POA: Diagnosis not present

## 2018-03-18 DIAGNOSIS — Z0000 Encounter for general adult medical examination without abnormal findings: Secondary | ICD-10-CM | POA: Diagnosis not present

## 2018-03-18 DIAGNOSIS — K648 Other hemorrhoids: Secondary | ICD-10-CM

## 2018-03-18 DIAGNOSIS — D649 Anemia, unspecified: Secondary | ICD-10-CM | POA: Diagnosis not present

## 2018-03-18 DIAGNOSIS — R350 Frequency of micturition: Secondary | ICD-10-CM

## 2018-03-18 DIAGNOSIS — I25118 Atherosclerotic heart disease of native coronary artery with other forms of angina pectoris: Secondary | ICD-10-CM | POA: Diagnosis not present

## 2018-03-18 DIAGNOSIS — F32A Depression, unspecified: Secondary | ICD-10-CM

## 2018-03-18 DIAGNOSIS — I10 Essential (primary) hypertension: Secondary | ICD-10-CM | POA: Diagnosis not present

## 2018-03-18 DIAGNOSIS — F329 Major depressive disorder, single episode, unspecified: Secondary | ICD-10-CM

## 2018-03-18 DIAGNOSIS — I5032 Chronic diastolic (congestive) heart failure: Secondary | ICD-10-CM

## 2018-03-18 NOTE — Progress Notes (Signed)
Patient ID: Charlotte Wagner, Dr., female   DOB: 1935-11-20, 82 y.o.   MRN: [id number]   Subjective:    Patient ID: Charlotte Wagner, Dr., female    DOB: September 14, 1936, 82 y.o.   MRN: [id number]  HPI  Patient here for a scheduled follow up.  She reports she is doing better.  She was recently admitted 02/16/18 with lower GI bleeding - hemorrhoidal bleed.  S/p hemorrhoidectomy.  Has not had any bleeding recently.  She is on iron.  Taking bid.  Requested f/u cbc and ferritin.  Has f/u with Dr Bary Castilla.  Taking miralax for her bowels.  Most recent issues has been with her voice - laryngitis - since med May.  Saw ENT.  Found to have paralyzed vocal cord. Due to have CT head/neck and chest.   Scheduled for tomorrow.  She feels her breathing is overall stable.  No increased heart rate or palpitations.  No chest pain.  No abdominal pian.  Bowels moving.  Has noticed some urine incontinence.  Discussed checking urine to confirm no infection.      Past Medical History:  Diagnosis Date  . Allergy   . Arthritis    s/p bilateral knees and left hip replacement  . Asthma   . CAD (coronary artery disease)    a. 12/1996 s/p CABG x4 ([place]);  b. 2005 Pt reports stress test & cath, which revealed patent grafts.  . Cancer ([place])    melanoma right arm  . Carotid arterial disease ([place])    a. 04/2015 Carotid U/S: <50% bilat ICA stenosis.  . Chronic kidney disease   . Colon polyps    H/O  . Depression   . GERD (gastroesophageal reflux disease)    h/o hiatal hernia  . Headache    migraines in past  . Heart murmur    a. 04/2011 Echo: EF 55-60%, bilat atrial enlargement, mild to mod TR.  [name] History of chicken pox   . History of hiatal hernia   . Hx of migraines    rare now  . Hx: UTI (urinary tract infection)   . Hyperlipidemia    a. Statin intolerant -->on zetia.  . Hypertension   . Hypertensive heart disease   . Lichen planus   . Melanoma ([place])   . Palpitations    a. rare PVC's and h/o SVT.  [name] PMR  (polymyalgia rheumatica) (HCC)    h/o in setting of crestor usage.  [name] PSVT (paroxysmal supraventricular tachycardia) ([place])   . PUD (peptic ulcer disease)    remote history  . Raynaud's phenomenon   . Spinal stenosis   . Urine incontinence    H/O  . Vitamin D deficiency    Past Surgical History:  Procedure Laterality Date  . ADENOIDECTOMY     age 11  . BACK SURGERY     L3-L5  . BILATERAL CARPAL TUNNEL RELEASE    . BREAST BIOPSY Right    bx x 3 neg  . BREAST SURGERY Right    biopsy x 3 (all benign)  . CARDIAC CATHETERIZATION N/A 06/01/2016   Procedure: LEFT HEART CATH AND CORS/GRAFTS ANGIOGRAPHY;  Surgeon: [place] Hampshire, MD;  Location: [place];  Service: Cardiovascular;  Laterality: N/A;  . CARDIAC CATHETERIZATION N/A 06/01/2016   Procedure: Coronary Stent Intervention;  Surgeon: [place] Hampshire, MD;  Location: [place] [facility];  Service: Cardiovascular;  Laterality: N/A;  . CARDIOVERSION N/A 03/08/2017   Procedure: CARDIOVERSION;  Surgeon: Kathlyn Sacramento  A, MD;  Location: [facility];  Service: Cardiovascular;  Laterality: N/A;  . CATARACT EXTRACTION W/PHACO Right 01/04/2016   Procedure: CATARACT EXTRACTION PHACO AND INTRAOCULAR LENS PLACEMENT (IOC);  Surgeon: Leandrew Koyanagi, MD;  Location: [place];  Service: Ophthalmology;  Laterality: Right;  MALYUGIN  . CATARACT EXTRACTION W/PHACO Left 01/25/2016   Procedure: CATARACT EXTRACTION PHACO AND INTRAOCULAR LENS PLACEMENT ([place]) left eye;  Surgeon: Leandrew Koyanagi, MD;  Location: [place];  Service: Ophthalmology;  Laterality: Left;  MALYUGIN SHUGARCAINE  . CHOLECYSTECTOMY  90's  . COLONOSCOPY WITH PROPOFOL N/A 05/03/2015   Procedure: COLONOSCOPY WITH PROPOFOL;  Surgeon: Lollie Sails, MD;  Location[facility];  Service: Endoscopy;  Laterality: N/A;  . CORONARY ARTERY BYPASS GRAFT  98   4 vessel  . ESOPHAGOGASTRODUODENOSCOPY N/A 03/22/2015   Procedure: ESOPHAGOGASTRODUODENOSCOPY  (EGD);  Surgeon: Lollie Sails, MD;  Location: [facility] ENDOSCOPY;  Service: Endoscopy;  Laterality: N/A;  . HEMORRHOID BANDING    . HEMORRHOID SURGERY N/A 02/17/2018   Procedure: HEMORRHOIDECTOMY;  Surgeon: Robert Bellow, MD;  Location: [facility];  Service: General;  Laterality: N/A;  . JOINT REPLACEMENT     BILATERAL KNEE REPLACEMENTS  . KNEE ARTHROSCOPY W/ OATS PROCEDURE     Lt knee (9/01), Rt knee (3/11), Lt hip (5/10)  . TOTAL HIP ARTHROPLASTY     Family History  Problem Relation Age of Onset  . Thyroid disease Mother        graves disease  . Heart disease Father        rheumatic heart  . Alcohol abuse Father   . Depression Father   . Lung cancer Sister   . Depression Daughter   . Thyroid disease Daughter        hashimoto  . Depression Son   . Stroke Maternal Grandmother   . Depression Maternal Grandmother   . Hypertension Maternal Grandfather   . Hypertension Paternal Grandfather   . Seizures Son   . Breast cancer Neg Hx    Social History   Socioeconomic History  . Marital status: Married    Spouse name: Not on file  . Number of children: 3  . Years of education: Not on file  . Highest education level: Not on file  Occupational History  . Occupation: Retired Sport and exercise psychologist  Social Needs  . Financial resource strain: Not on file  . Food insecurity:    Worry: Not on file    Inability: Not on file  . Transportation needs:    Medical: Not on file    Non-medical: Not on file  Tobacco Use  . Smoking status: Former Research scientist (life sciences)  . Smokeless tobacco: Never Used  . Tobacco comment: quit 44+ yrs ago  Substance and Sexual Activity  . Alcohol use: No    Alcohol/week: 0.0 oz  . Drug use: No  . Sexual activity: Never  Lifestyle  . Physical activity:    Days per week: 5 days    Minutes per session: 30 min  . Stress: Not at all  Relationships  . Social connections:    Talks on phone: Not on file    Gets together: Not on file    Attends religious service: Not on  file    Active member of club or organization: Not on file    Attends meetings of clubs or organizations: Not on file    Relationship status: Not on file  Other Topics Concern  . Not on file  Social History Narrative  Lives in [place].  Retired Engineer, drilling.  Relatively active.   Uses a Rollator to ambulate    No facility-administered encounter medications on file as of 03/18/2018.    Outpatient Encounter Medications as of 03/18/2018  Medication Sig  . acetaminophen (TYLENOL) 500 MG tablet Take 1,000 mg by mouth 2 (two) times daily.   [name] ADVAIR DISKUS 250-50 MCG/DOSE AEPB INHALE 1 PUFF BY MOUTH TWICE DAILY  . ATROVENT HFA 17 MCG/ACT inhaler inhale 2 puffs INTO THE LUNGS every 6 hours if needed for wheezing  . b complex vitamins tablet Take 1 tablet by mouth daily.  . Cholecalciferol (VITAMIN D3) 2000 UNITS TABS Take 2,000 Units by mouth 2 (two) times a week.   . Coenzyme Q10 (COQ-10) 200 MG CAPS Take 200 mg by mouth daily.   [name] ezetimibe (ZETIA) 10 MG tablet TAKE 1 TABLET BY MOUTH ONCE DAILY  . ferrous sulfate 325 (65 FE) MG tablet Take 1 tablet (325 mg total) by mouth 2 (two) times daily with a meal.  . folic acid (FOLVITE) 1 MG tablet Take 1 tablet (1 mg total) by mouth daily.  [name] loratadine (CLARITIN) 10 MG tablet Take 10 mg by mouth daily.  . meclizine (ANTIVERT) 25 MG tablet Take 25 mg by mouth 3 (three) times daily as needed for dizziness.  . miconazole (MICOTIN) 2 % cream Apply 1 application topically at bedtime as needed (with hydrocortisone for lichen planus).   . Multiple Vitamins-Minerals (HAIR/SKIN/NAILS PO) Take 1 tablet by mouth daily.  . pantoprazole (PROTONIX) 40 MG tablet Take 1 tablet (40 mg total) by mouth 2 (two) times daily.  . polyethylene glycol powder (GLYCOLAX/MIRALAX) powder Take 8.5 g by mouth daily.   [name] spironolactone (ALDACTONE) 25 MG tablet Take 1 tablet (25 mg total) by mouth every morning.  . traZODone (DESYREL) 150 MG tablet TAKE 1 AND 2/3 TO 2 TABLETS BY  MOUTH AT BEDTIME AS DIRECTED (Patient taking differently: Take 250 mg by mouth at bedtime (1 and 2/3 tablets))  . verapamil (CALAN-SR) 180 MG CR tablet take 1 tablet by mouth twice a day  . vitamin B-12 (CYANOCOBALAMIN) 1000 MCG tablet Take 1,000 mcg by mouth daily.  . vitamin C (ASCORBIC ACID) 500 MG tablet Take 500 mg by mouth at bedtime.  . [DISCONTINUED] amitriptyline (ELAVIL) 25 MG tablet take 1 to 2 tablets by mouth at bedtime (Patient taking differently: Take 25 mg by mouth at bedtime)  . [DISCONTINUED] hydrocortisone 2.5 % cream apply to VULVA twice a day AT 10AM AND 4 PM (Patient taking differently: Apply to VULVA at bedtime as needed with miconazole for lichen planus)  . [DISCONTINUED] ketoconazole (NIZORAL) 2 % cream Apply 1 application topically daily. (Patient taking differently: Apply 1 application topically daily as needed (for crack bleeding fingers.). )  . [DISCONTINUED] nitroGLYCERIN (NITROSTAT) 0.4 MG SL tablet Place 1 tablet (0.4 mg total) under the tongue every 5 (five) minutes as needed for chest pain. Place 1 tablet (0.4 mg total) under the tongue every 5 (five) minutes , 3 times as needed for chest pain. (Patient not taking: Reported on 03/18/2018)  . [DISCONTINUED] Omega-3 Fatty Acids (FISH OIL) 1000 MG CAPS Take 1,000 mg by mouth daily.   . [DISCONTINUED] traMADol (ULTRAM) 50 MG tablet Take 1 tablet (50 mg total) by mouth daily as needed. (Patient taking differently: Take 50 mg by mouth daily as needed for severe pain. )    Review of Systems  Constitutional: Negative for appetite change and fever.  HENT:  Positive for voice change. Negative for congestion and sinus pressure.   Respiratory: Negative for cough and chest tightness.        Breathing stable.   Cardiovascular: Negative for chest pain, palpitations and leg swelling.  Gastrointestinal: Negative for abdominal pain, diarrhea, nausea and vomiting.  Genitourinary: Negative for difficulty urinating.       Some urine  incontinence as outlined.   Musculoskeletal: Negative for joint swelling and myalgias.  Skin: Negative for color change and rash.  Neurological: Negative for dizziness, light-headedness and headaches.  Psychiatric/Behavioral: Negative for agitation and dysphoric mood.       Objective:    Physical Exam  Constitutional: She appears well-developed and well-nourished. No distress.  HENT:  Nose: Nose normal.  Mouth/Throat: Oropharynx is clear and moist.  Neck: Neck supple. No thyromegaly present.  Cardiovascular: Normal rate.  Rate controlled.   Pulmonary/Chest: Breath sounds normal. No respiratory distress. She has no wheezes.  Abdominal: Soft. Bowel sounds are normal. There is no tenderness.  Musculoskeletal: She exhibits no edema or tenderness.  Lymphadenopathy:    She has no cervical adenopathy.  Skin: No rash noted. No erythema.  Psychiatric: She has a normal mood and affect. Her behavior is normal.    BP 118/64 (BP Location: Left Arm, Patient Position: Sitting, Cuff Size: Normal)   Temp 98.4 F (36.9 C) (Oral)   Wt 180 lb 1.9 oz (81.7 kg)   SpO2 95%   BMI 31.91 kg/m  Wt Readings from Last 3 Encounters:  03/21/18 182 lb 4.8 oz (82.7 kg)  03/18/18 180 lb 1.9 oz (81.7 kg)  03/18/18 180 lb 1.9 oz (81.7 kg)     Lab Results  Component Value Date   WBC 7.8 03/22/2018   HGB 10.6 (L) 03/22/2018   HCT 31.1 (L) 03/22/2018   PLT 137 (L) 03/22/2018   GLUCOSE 122 (H) 03/23/2018   CHOL 189 01/14/2018   TRIG 77.0 01/14/2018   HDL 62.30 01/14/2018   LDLDIRECT 126.7 07/03/2013   LDLCALC 112 (H) 01/14/2018   ALT 17 03/21/2018   AST 27 03/21/2018   NA 129 (L) 03/23/2018   K 4.0 03/23/2018   CL 103 03/23/2018   CREATININE 0.89 03/23/2018   BUN 27 (H) 03/23/2018   CO2 18 (L) 03/23/2018   TSH 0.787 03/21/2018   INR 1.07 03/21/2018   HGBA1C 5.8 01/14/2018       Assessment & Plan:   Problem List Items Addressed This Visit    Anemia    On iron bid now.  Recheck cbc and  ferritin.        Relevant Orders   CBC with Differential/Platelet (Completed)   Ferritin (Completed)   IBC panel (Completed)   Asthma    Breathing stable.       CAD (coronary artery disease)    S/p DES - SVG to [facility] 05/2016.  Followed by cardiology.        Chronic diastolic heart failure (HCC)    No evidence of volume overload on exam.  Feels stable.  Followed by cardiology.       Depression    Stable on current regimen.  Follow.       Essential hypertension, benign    Blood pressure under good control.  Continue same medication regimen.  Follow pressures.  Follow metabolic panel.        Hemorrhoids, internal    S/p hemorrhoidectomy.  Followed by Dr Bary Castilla.  Doing better. No bleeding.  Hyponatremia - Primary    Previously noted to have decreased sodium.  Recheck today to confirm improved.        Relevant Orders   Basic metabolic panel (Completed)   Laryngitis    Recently evaluated for persistent laryngitis.  Evaluated by ENT. Paralyzed vocal cords.  Planning for CT head, neck and chest.  Continue f/u with ENT.        Other Visit Diagnoses    Urinary frequency       With some incontinence.  Check urinalyis and culture to confirm if infection present.     Relevant Orders   Urinalysis, Routine w reflex microscopic (Completed)   Urine Culture (Completed)       [name], MD

## 2018-03-18 NOTE — Patient Instructions (Addendum)
  Ms. Yandell , Thank you for taking time to come for your Medicare Wellness Visit. I appreciate your ongoing commitment to your health goals. Please review the following plan we discussed and let me know if I can assist you in the future.   These are the goals we discussed: Goals    . Increase physical activity     Core and leg strengthening exercises          This is a list of the screening recommended for you and due dates:  Health Maintenance  Topic Date Due  . DEXA scan (bone density measurement)  04/08/2003  . Mammogram  03/28/2018  . Flu Shot  05/08/2018  . Tetanus Vaccine  02/23/2020  . Colon Cancer Screening  05/02/2020  . Pneumonia vaccines  Completed

## 2018-03-18 NOTE — Progress Notes (Addendum)
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  Review of Systems:  No ROS.  Medicare Wellness Visit. Additional risk factors are reflected in the social history. Cardiac Risk Factors include: advanced age (>59men, >63 women);hypertension     Objective:     Vitals: BP 118/64 (BP Location: Left Arm, Patient Position: Sitting, Cuff Size: Normal)   Pulse 60   Temp 98.4 F (36.9 C) (Oral)   Resp 15   Ht 5\' 3"  (1.6 m)   Wt 180 lb 1.9 oz (81.7 kg)   SpO2 95%   BMI 31.91 kg/m   Body mass index is 31.91 kg/m.  Advanced Directives 03/18/2018 02/16/2018 03/12/2017 03/12/2017 03/11/2017 03/08/2017 10/19/2016  Does Patient Have a Medical Advance Directive? Yes Yes Yes Yes Yes No Yes  Type of Paramedic of [place];Living will [place];Living will;Out of facility DNR (pink MOST or yellow form) Living will [place];Living will  Does patient want to make changes to medical advance directive? No - Patient declined No - Patient declined No - Patient declined - No - Patient declined - -  Copy of [place] in Chart? No - copy requested No - copy requested No - copy requested No - copy requested No - copy requested - -  Would patient like information on creating a medical advance directive? - - No - Patient declined No - Patient declined - No - Patient declined -  Pre-existing out of facility DNR order (yellow form or pink MOST form) - (No Data) - - - - -    Tobacco Social History   Tobacco Use  Smoking Status Former Smoker  Smokeless Tobacco Never Used  Tobacco Comment   quit 44+ yrs ago     Counseling given: Not Answered Comment: quit 44+ yrs ago   Clinical Intake:  Pre-visit preparation completed: Yes  Pain : No/denies pain     Nutritional Status: BMI > 30   Obese Diabetes: No  How often do you need to have someone help you when you read instructions, pamphlets, or other written materials from your doctor or pharmacy?: 1 - Never  Interpreter Needed?: No     Past Medical History:  Diagnosis Date  . Allergy   . Arthritis    s/p bilateral knees and left hip replacement  . Asthma   . CAD (coronary artery disease)    a. 12/1996 s/p CABG x4 ([place]);  b. 2005 Pt reports stress test & cath, which revealed patent grafts.  . Cancer ([place])    melanoma right arm  . Carotid arterial disease ([place])    a. 04/2015 Carotid U/S: <50% bilat ICA stenosis.  . Chronic kidney disease   . Colon polyps    H/O  . Depression   . GERD (gastroesophageal reflux disease)    h/o hiatal hernia  . Headache    migraines in past  . Heart murmur    a. 04/2011 Echo: EF 55-60%, bilat atrial enlargement, mild to mod TR.  [name] History of chicken pox   . History of hiatal hernia   . Hx of migraines    rare now  . Hx: UTI (urinary tract infection)   . Hyperlipidemia    a. Statin intolerant -->on zetia.  . Hypertensive heart disease   . Lichen planus   . Melanoma ([place])   .  Palpitations    a. rare PVC's and h/o SVT.  [name] PMR (polymyalgia rheumatica) (H[facility])    h/o in setting of crestor usage.  [name] PSVT (paroxysmal supraventricular tachycardia) ([place])   . PUD (peptic ulcer disease)    remote history  . Raynaud's phenomenon   . Spinal stenosis   . Urine incontinence    H/O  . Vitamin D deficiency    Past Surgical History:  Procedure Laterality Date  . ADENOIDECTOMY     age 18  . BACK SURGERY     L3-L5  . BILATERAL CARPAL TUNNEL RELEASE    . BREAST BIOPSY Right    bx x 3 neg  . BREAST SURGERY Right    biopsy x 3 (all benign)  . CARDIAC CATHETERIZATION N/A 06/01/2016   Procedure: LEFT HEART CATH AND CORS/GRAFTS ANGIOGRAPHY;  Surgeon: Wellington Hampshire, MD;  Location: [facility];  Service: Cardiovascular;  Laterality: N/A;  . CARDIAC CATHETERIZATION N/A  06/01/2016   Procedure: Coronary Stent Intervention;  Surgeon: Wellington Hampshire, MD;  Location: [facility];  Service: Cardiovascular;  Laterality: N/A;  . CARDIOVERSION N/A 03/08/2017   Procedure: CARDIOVERSION;  Surgeon: Wellington Hampshire, MD;  Location: [facility];  Service: Cardiovascular;  Laterality: N/A;  . CATARACT EXTRACTION W/PHACO Right 01/04/2016   Procedure: CATARACT EXTRACTION PHACO AND INTRAOCULAR LENS PLACEMENT ([place]);  Surgeon: Leandrew Koyanagi, MD;  Location: [facility];  Service: Ophthalmology;  Laterality: Right;  MALYUGIN  . CATARACT EXTRACTION W/PHACO Left 01/25/2016   Procedure: CATARACT EXTRACTION PHACO AND INTRAOCULAR LENS PLACEMENT ([place]) left eye;  Surgeon: Leandrew Koyanagi, MD;  Location: [facility];  Service: Ophthalmology;  Laterality: Left;  MALYUGIN SHUGARCAINE  . CHOLECYSTECTOMY  90's  . COLONOSCOPY WITH PROPOFOL N/A 05/03/2015   Procedure: COLONOSCOPY WITH PROPOFOL;  Surgeon: Lollie Sails, MD;  [facility];  Service: Endoscopy;  Laterality: N/A;  . CORONARY ARTERY BYPASS GRAFT  98   4 vessel  . ESOPHAGOGASTRODUODENOSCOPY N/A 03/22/2015   Procedure: ESOPHAGOGASTRODUODENOSCOPY (EGD);  Surgeon: Lollie Sails, MD;  Location: [facility] ENDOSCOPY;  Service: Endoscopy;  Laterality: N/A;  . HEMORRHOID BANDING    . HEMORRHOID SURGERY N/A 02/17/2018   Procedure: HEMORRHOIDECTOMY;  Surgeon: Robert Bellow, MD;  Location: [facility] ORS;  Service: General;  Laterality: N/A;  . JOINT REPLACEMENT     BILATERAL KNEE REPLACEMENTS  . KNEE ARTHROSCOPY W/ OATS PROCEDURE     Lt knee (9/01), Rt knee (3/11), Lt hip (5/10)  . TOTAL HIP ARTHROPLASTY     Family History  Problem Relation Age of Onset  . Thyroid disease Mother        graves disease  . Heart disease Father        rheumatic heart  . Alcohol abuse Father   . Depression Father   . Lung cancer Sister   . Depression Daughter   . Thyroid disease Daughter        hashimoto  .  Depression Son   . Stroke Maternal Grandmother   . Depression Maternal Grandmother   . Hypertension Maternal Grandfather   . Hypertension Paternal Grandfather   . Seizures Son   . Breast cancer Neg Hx    Social History   Socioeconomic History  . Marital status: Married    Spouse name: Not on file  . Number of children: 3  . Years of education: Not on file  . Highest education level: Not on file  Occupational History  . 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 Financial resource strain: Not on file  . Food insecurity:    Worry: Not on file    Inability: Not on file  . Transportation needs:    Medical: Not on file    Non-medical: Not on file  Tobacco Use  . Smoking status: Former Research scientist (life sciences)  . Smokeless tobacco: Never Used  . Tobacco comment: quit 44+ yrs ago  Substance and Sexual Activity  . Alcohol use: No    Alcohol/week: 0.0 oz  . Drug use: No  . Sexual activity: Never  Lifestyle  . Physical activity:    Days per week: 5 days    Minutes per session: 30 min  . Stress: Not at all  Relationships  . Social connections:    Talks on phone: Not on file    Gets together: Not on file    Attends religious service: Not on file    Active member of club or organization: Not on file    Attends meetings of clubs or organizations: Not on file    Relationship status: Not on file  Other Topics Concern  . Not on file  Social History Narrative   Lives in [place].  Retired Engineer, drilling.  Relatively active.    Outpatient Encounter Medications as of 03/18/2018  Medication Sig  . acetaminophen (TYLENOL) 500 MG tablet Take 1,000 mg by mouth 2 (two) times daily.   [name] ADVAIR DISKUS 250-50 MCG/DOSE AEPB INHALE 1 PUFF BY MOUTH TWICE DAILY  . amitriptyline (ELAVIL) 25 MG tablet take 1 to 2 tablets by mouth at bedtime (Patient taking differently: Take 25 mg by mouth at bedtime)  . ATROVENT HFA 17 MCG/ACT inhaler inhale 2 puffs INTO THE LUNGS every 6 hours if needed for wheezing    . b complex vitamins tablet Take 1 tablet by mouth daily.  . Cholecalciferol (VITAMIN D3) 2000 UNITS TABS Take 2,000 Units by mouth 2 (two) times a week.   . Coenzyme Q10 (COQ-10) 200 MG CAPS Take 200 mg by mouth daily.   [name] ezetimibe (ZETIA) 10 MG tablet TAKE 1 TABLET BY MOUTH ONCE DAILY  . ferrous sulfate 325 (65 FE) MG tablet Take 1 tablet (325 mg total) by mouth 2 (two) times daily with a meal.  . folic acid (FOLVITE) 1 MG tablet Take 1 tablet (1 mg total) by mouth daily.  . hydrocortisone 2.5 % cream apply to VULVA twice a day AT 10AM AND 4 PM (Patient taking differently: Apply to VULVA at bedtime as needed with miconazole for lichen planus)  . ketoconazole (NIZORAL) 2 % cream Apply 1 application topically daily. (Patient taking differently: Apply 1 application topically daily as needed (for crack bleeding fingers.). )  . loratadine (CLARITIN) 10 MG tablet Take 10 mg by mouth daily.  . meclizine (ANTIVERT) 25 MG tablet Take 25 mg by mouth 3 (three) times daily as needed for dizziness.  . miconazole (MICOTIN) 2 % cream Apply 1 application topically at bedtime as needed (with hydrocortisone for lichen planus).   . Multiple Vitamins-Minerals (HAIR/SKIN/NAILS PO) Take 1 tablet by mouth daily.  . Omega-3 Fatty Acids (FISH OIL) 1000 MG CAPS Take 1,000 mg by mouth daily.   . pantoprazole (PROTONIX) 40 MG tablet Take 1 tablet (40 mg total) by mouth 2 (two) times daily.  . polyethylene glycol powder (GLYCOLAX/MIRALAX) powder Take 8.5 g by mouth daily.   [name] spironolactone (ALDACTONE) 25 MG tablet Take 1 tablet (25 mg total) by mouth every morning.  [name]  traMADol (ULTRAM) 50 MG tablet Take 1 tablet (50 mg total) by mouth daily as needed. (Patient taking differently: Take 50 mg by mouth daily as needed for severe pain. )  . traZODone (DESYREL) 150 MG tablet TAKE 1 AND 2/3 TO 2 TABLETS BY MOUTH AT BEDTIME AS DIRECTED (Patient taking differently: Take 250 mg by mouth at bedtime (1 and 2/3 tablets))  .  verapamil (CALAN-SR) 180 MG CR tablet take 1 tablet by mouth twice a day  . vitamin B-12 (CYANOCOBALAMIN) 1000 MCG tablet Take 1,000 mcg by mouth daily.  . vitamin C (ASCORBIC ACID) 500 MG tablet Take 500 mg by mouth at bedtime.  . nitroGLYCERIN (NITROSTAT) 0.4 MG SL tablet Place 1 tablet (0.4 mg total) under the tongue every 5 (five) minutes as needed for chest pain. Place 1 tablet (0.4 mg total) under the tongue every 5 (five) minutes , 3 times as needed for chest pain. (Patient not taking: Reported on 03/18/2018)   No facility-administered encounter medications on file as of 03/18/2018.     Activities of Daily Living In your present state of health, do you have any difficulty performing the following activities: 03/18/2018 02/16/2018  Hearing? N N  Vision? N N  Difficulty concentrating or making decisions? N N  Walking or climbing stairs? Y N  Comment Unsteady gait. Cane in use.  -  Dressing or bathing? N N  Doing errands, shopping? Y N  Comment She does not drive.  -  Preparing Food and eating ? N -  Using the Toilet? N -  In the past six months, have you accidently leaked urine? Y -  Comment Managed with a daily brief. Followed by pcp.  -  Do you have problems with loss of bowel control? N -  Comment Soft stools.  No bleeding.  -  Managing your Medications? N -  Managing your Finances? N -  Housekeeping or managing your Housekeeping? Y -  Comment Housekeeping assists -  Some recent data might be hidden    Patient Care Team: [name], MD as PCP - General (Internal Medicine) Wende Bushy, MD as Consulting Physician (Cardiology)    Assessment:   This is a routine wellness examination for [place].  The goal of the wellness visit is to assist the patient how to close the gaps in care and create a preventative care plan for the patient.   The roster of all physicians providing medical care to patient is listed in the Snapshot section of the chart.  Taking calcium VIT D as  appropriate/Osteoporosis risk reviewed.  Dexa scan discussed.   Safety issues reviewed; Smoke and carbon monoxide detectors in the home. No firearms in the home. Wears seatbelts when riding with others. No violence in the home.  They do not have excessive sun exposure.  Discussed the need for sun protection: hats, long sleeves and the use of sunscreen if there is significant sun exposure.  Patient is alert, normal appearance, oriented to person/place/and time. Correctly identified the president of the [place] and recalls of 5/5 words. Performs simple calculations and can read correct time from watch face. Displays appropriate judgement.  No new identified risk were noted.  No failures at ADL's or IADL's.  Ambulates with cane or rolling walker.   BMI- discussed the importance of a healthy diet, water intake and the benefits of aerobic exercise.  Dental- UTD.  Eye- Visual acuity not assessed per patient preference since they have regular follow up with the ophthalmologist.  Wears  corrective lenses.  Sleep patterns- Nocturia x2. Naps during the day as needed.   Patient Concerns: Urine incontinence x2 days. Request repeat of cbc and clean catch urine. Deferred to pcp.   Exercise Activities and Dietary recommendations Current Exercise Habits: Structured exercise class, Type of exercise: walking;stretching(Nu step), Time (Minutes): 30, Frequency (Times/Week): 5, Weekly Exercise (Minutes/Week): 150, Intensity: Mild  Goals    . Increase physical activity     Core and leg strengthening exercises          Fall Risk Fall Risk  03/05/2018 03/11/2017 10/19/2016 09/06/2016 06/21/2016  Falls in the past year? No No No No Yes  Number falls in past yr: - - - - 1  Injury with Fall? - - - - Yes  Risk Factor Category  - - - - High Fall Risk  Risk for fall due to : - - Impaired balance/gait - Impaired balance/gait  Follow up - - - - Education provided   Depression Screen PHQ 2/9 Scores 03/05/2018  03/11/2017 11/19/2016 10/19/2016  PHQ - 2 Score 0 1 1 1   PHQ- 9 Score - 3 4 -     Cognitive Function MMSE - Mini Mental State Exam 03/11/2017 02/23/2016  Orientation to time 5 5  Orientation to Place 5 5  Registration 3 3  Attention/ Calculation 5 5  Recall 3 3  Language- name 2 objects 2 2  Language- repeat 1 1  Language- follow 3 step command 3 3  Language- read & follow direction 1 1  Write a sentence 1 1  Copy design 1 1  Total score 30 30     6CIT Screen 03/18/2018  What Year? 0 points  What month? 0 points  What time? 0 points  Count back from 20 0 points  Months in reverse 0 points  Repeat phrase 0 points  Total Score 0    Immunization History  Administered Date(s) Administered  . Influenza, High Dose Seasonal PF 06/14/2016  . Influenza,inj,Quad PF,6+ Mos 06/15/2014  . Influenza-Unspecified 07/13/2015  . Pneumococcal Conjugate-13 01/13/2015  . Pneumococcal Polysaccharide-23 06/21/2009  . Tdap 02/22/2010  . Zoster 11/15/2010   Screening Tests Health Maintenance  Topic Date Due  . DEXA SCAN  04/08/2003  . MAMMOGRAM  03/28/2018  . INFLUENZA VACCINE  05/08/2018  . TETANUS/TDAP  02/23/2020  . COLONOSCOPY  05/02/2020  . PNA vac Low Risk Adult  Completed      Plan:    End of life planning; Advance aging; Advanced directives discussed. Copy of current HCPOA/Living Will requested.    I have personally reviewed and noted the following in the patient's chart:   . Medical and social history . Use of alcohol, tobacco or illicit drugs  . Current medications and supplements . Functional ability and status . Nutritional status . Physical activity . Advanced directives . List of other physicians . Hospitalizations, surgeries, and ER visits in previous 12 months . Vitals . Screenings to include cognitive, depression, and falls . Referrals and appointments  In addition, I have reviewed and discussed with patient certain preventive protocols, quality metrics, and  best practice recommendations. A written personalized care plan for preventive services as well as general preventive health recommendations were provided to patient.     Varney Biles, LPN  [id number]/5732   Reviewed above information.  Agree with assessment and plan.  Cbc and urine obtained at appt.    Dr Nicki Reaper

## 2018-03-19 ENCOUNTER — Ambulatory Visit
Admission: RE | Admit: 2018-03-19 | Discharge: 2018-03-19 | Disposition: A | Discharge status: Home / Self Care | Payer: Medicare Other | Source: Ambulatory Visit | Attending: Unknown Physician Specialty | Admitting: Unknown Physician Specialty

## 2018-03-19 DIAGNOSIS — J3800 Paralysis of vocal cords and larynx, unspecified: Secondary | ICD-10-CM

## 2018-03-19 DIAGNOSIS — I700 Atherosclerosis of aorta: Secondary | ICD-10-CM

## 2018-03-19 DIAGNOSIS — R059 Cough, unspecified: Secondary | ICD-10-CM

## 2018-03-19 DIAGNOSIS — J3801 Paralysis of vocal cords and larynx, unilateral: Secondary | ICD-10-CM

## 2018-03-19 DIAGNOSIS — E871 Hypo-osmolality and hyponatremia: Secondary | ICD-10-CM | POA: Diagnosis not present

## 2018-03-19 DIAGNOSIS — S2241XA Multiple fractures of ribs, right side, initial encounter for closed fracture: Secondary | ICD-10-CM

## 2018-03-19 DIAGNOSIS — X58XXXA Exposure to other specified factors, initial encounter: Secondary | ICD-10-CM | POA: Insufficient documentation

## 2018-03-19 DIAGNOSIS — Q446 Cystic disease of liver: Secondary | ICD-10-CM | POA: Insufficient documentation

## 2018-03-19 DIAGNOSIS — A419 Sepsis, unspecified organism: Secondary | ICD-10-CM | POA: Diagnosis not present

## 2018-03-19 DIAGNOSIS — R05 Cough: Secondary | ICD-10-CM | POA: Insufficient documentation

## 2018-03-19 DIAGNOSIS — A4151 Sepsis due to Escherichia coli [E. coli]: Secondary | ICD-10-CM | POA: Diagnosis not present

## 2018-03-19 DIAGNOSIS — I481 Persistent atrial fibrillation: Secondary | ICD-10-CM | POA: Diagnosis not present

## 2018-03-19 DIAGNOSIS — J984 Other disorders of lung: Secondary | ICD-10-CM | POA: Insufficient documentation

## 2018-03-19 DIAGNOSIS — G8929 Other chronic pain: Secondary | ICD-10-CM

## 2018-03-19 DIAGNOSIS — R51 Headache: Secondary | ICD-10-CM | POA: Insufficient documentation

## 2018-03-19 DIAGNOSIS — R0602 Shortness of breath: Secondary | ICD-10-CM | POA: Diagnosis not present

## 2018-03-19 DIAGNOSIS — E042 Nontoxic multinodular goiter: Secondary | ICD-10-CM

## 2018-03-19 DIAGNOSIS — D71 Functional disorders of polymorphonuclear neutrophils: Secondary | ICD-10-CM | POA: Insufficient documentation

## 2018-03-19 DIAGNOSIS — Z96642 Presence of left artificial hip joint: Secondary | ICD-10-CM | POA: Diagnosis not present

## 2018-03-19 DIAGNOSIS — N390 Urinary tract infection, site not specified: Secondary | ICD-10-CM | POA: Diagnosis not present

## 2018-03-19 DIAGNOSIS — Z96653 Presence of artificial knee joint, bilateral: Secondary | ICD-10-CM | POA: Diagnosis not present

## 2018-03-19 DIAGNOSIS — I517 Cardiomegaly: Secondary | ICD-10-CM | POA: Diagnosis not present

## 2018-03-19 LAB — CBC WITH DIFFERENTIAL/PLATELET
Basophils Absolute: 0.1 10*3/uL (ref 0.0–0.1)
Basophils Relative: 1.1 % (ref 0.0–3.0)
Eosinophils Absolute: 0 10*3/uL (ref 0.0–0.7)
Eosinophils Relative: 0.4 % (ref 0.0–5.0)
HCT: 34.3 % — ABNORMAL LOW (ref 36.0–46.0)
Hemoglobin: 11.6 g/dL — ABNORMAL LOW (ref 12.0–15.0)
Lymphocytes Relative: 13.7 % (ref 12.0–46.0)
Lymphs Abs: 0.6 10*3/uL — ABNORMAL LOW (ref 0.7–4.0)
MCHC: 33.8 g/dL (ref 30.0–36.0)
MCV: 99.6 fl (ref 78.0–100.0)
Monocytes Absolute: 0.6 10*3/uL (ref 0.1–1.0)
Monocytes Relative: 11.9 % (ref 3.0–12.0)
Neutro Abs: 3.4 10*3/uL (ref 1.4–7.7)
Neutrophils Relative %: 72.9 % (ref 43.0–77.0)
Platelets: 229 10*3/uL (ref 150.0–400.0)
RBC: 3.44 Mil/uL — ABNORMAL LOW (ref 3.87–5.11)
RDW: 19 % — ABNORMAL HIGH (ref 11.5–15.5)
WBC: 4.7 10*3/uL (ref 4.0–10.5)

## 2018-03-19 LAB — FERRITIN: Ferritin: 41.2 ng/mL (ref 10.0–291.0)

## 2018-03-19 LAB — IBC PANEL
Iron: 40 ug/dL — ABNORMAL LOW (ref 42–145)
Saturation Ratios: 9.4 % — ABNORMAL LOW (ref 20.0–50.0)
Transferrin: 303 mg/dL (ref 212.0–360.0)

## 2018-03-19 LAB — BASIC METABOLIC PANEL
BUN: 14 mg/dL (ref 6–23)
CALCIUM: 9.7 mg/dL (ref 8.4–10.5)
CO2: 25 mEq/L (ref 19–32)
Chloride: 98 mEq/L (ref 96–112)
Creatinine, Ser: 0.91 mg/dL (ref 0.40–1.20)
GFR: 63.23 mL/min (ref >60.00)
Glucose, Bld: 95 mg/dL (ref 70–99)
POTASSIUM: 4.3 meq/L (ref 3.5–5.1)
SODIUM: 132 meq/L — AB (ref 135–145)

## 2018-03-19 LAB — URINALYSIS, ROUTINE W REFLEX MICROSCOPIC
Bilirubin Urine: NEGATIVE
Ketones, ur: NEGATIVE
Nitrite: POSITIVE — AB
Specific Gravity, Urine: 1.02 (ref 1.000–1.030)
Total Protein, Urine: NEGATIVE
Urine Glucose: NEGATIVE
Urobilinogen, UA: 1 (ref 0.0–1.0)
pH: 6.5 (ref 5.0–8.0)

## 2018-03-19 MED ORDER — IOPAMIDOL (ISOVUE-300) INJECTION 61%
75.0000 mL | Freq: Once | INTRAVENOUS | Status: AC | PRN
  Administered 2018-03-19: 75 mL via INTRAVENOUS

## 2018-03-20 ENCOUNTER — Telehealth: Payer: Self-pay

## 2018-03-20 LAB — URINE CULTURE
MICRO NUMBER: 90699514
SPECIMEN QUALITY: ADEQUATE

## 2018-03-20 NOTE — Telephone Encounter (Signed)
Copied from [place] [phone]. Topic: Inquiry >> Mar 20, 2018  3:04 PM Pricilla Handler wrote: Reason for CRM: Patient called requesting Lab Results. Please call patient.       Thank You!!!

## 2018-03-21 ENCOUNTER — Encounter: Payer: Self-pay | Admitting: Internal Medicine

## 2018-03-21 ENCOUNTER — Other Ambulatory Visit: Payer: Self-pay | Admitting: Internal Medicine

## 2018-03-21 ENCOUNTER — Inpatient Hospital Stay
Admission: EM | Admit: 2018-03-21 | Discharge: 2018-03-23 | DRG: 872 | Disposition: A | Discharge status: Home / Self Care | Payer: Medicare Other | Attending: Internal Medicine | Admitting: Internal Medicine

## 2018-03-21 ENCOUNTER — Other Ambulatory Visit: Payer: Self-pay

## 2018-03-21 ENCOUNTER — Emergency Department: Payer: Medicare Other

## 2018-03-21 DIAGNOSIS — M353 Polymyalgia rheumatica: Secondary | ICD-10-CM | POA: Diagnosis present

## 2018-03-21 DIAGNOSIS — Z96642 Presence of left artificial hip joint: Secondary | ICD-10-CM | POA: Diagnosis present

## 2018-03-21 DIAGNOSIS — R490 Dysphonia: Secondary | ICD-10-CM | POA: Diagnosis not present

## 2018-03-21 DIAGNOSIS — A419 Sepsis, unspecified organism: Secondary | ICD-10-CM | POA: Diagnosis not present

## 2018-03-21 DIAGNOSIS — K219 Gastro-esophageal reflux disease without esophagitis: Secondary | ICD-10-CM | POA: Diagnosis present

## 2018-03-21 DIAGNOSIS — E785 Hyperlipidemia, unspecified: Secondary | ICD-10-CM | POA: Diagnosis present

## 2018-03-21 DIAGNOSIS — Z66 Do not resuscitate: Secondary | ICD-10-CM | POA: Diagnosis present

## 2018-03-21 DIAGNOSIS — I129 Hypertensive chronic kidney disease with stage 1 through stage 4 chronic kidney disease, or unspecified chronic kidney disease: Secondary | ICD-10-CM | POA: Diagnosis present

## 2018-03-21 DIAGNOSIS — N189 Chronic kidney disease, unspecified: Secondary | ICD-10-CM | POA: Diagnosis present

## 2018-03-21 DIAGNOSIS — I7300 Raynaud's syndrome without gangrene: Secondary | ICD-10-CM | POA: Diagnosis present

## 2018-03-21 DIAGNOSIS — Z96653 Presence of artificial knee joint, bilateral: Secondary | ICD-10-CM | POA: Diagnosis present

## 2018-03-21 DIAGNOSIS — R0602 Shortness of breath: Secondary | ICD-10-CM | POA: Diagnosis not present

## 2018-03-21 DIAGNOSIS — Z760 Encounter for issue of repeat prescription: Secondary | ICD-10-CM

## 2018-03-21 DIAGNOSIS — K648 Other hemorrhoids: Secondary | ICD-10-CM | POA: Diagnosis not present

## 2018-03-21 DIAGNOSIS — A4151 Sepsis due to Escherichia coli [E. coli]: Principal | ICD-10-CM | POA: Diagnosis present

## 2018-03-21 DIAGNOSIS — E861 Hypovolemia: Secondary | ICD-10-CM | POA: Diagnosis present

## 2018-03-21 DIAGNOSIS — J449 Chronic obstructive pulmonary disease, unspecified: Secondary | ICD-10-CM | POA: Diagnosis present

## 2018-03-21 DIAGNOSIS — Z7951 Long term (current) use of inhaled steroids: Secondary | ICD-10-CM | POA: Diagnosis not present

## 2018-03-21 DIAGNOSIS — I2510 Atherosclerotic heart disease of native coronary artery without angina pectoris: Secondary | ICD-10-CM | POA: Diagnosis present

## 2018-03-21 DIAGNOSIS — I4891 Unspecified atrial fibrillation: Secondary | ICD-10-CM | POA: Diagnosis not present

## 2018-03-21 DIAGNOSIS — R7881 Bacteremia: Secondary | ICD-10-CM | POA: Diagnosis not present

## 2018-03-21 DIAGNOSIS — J3801 Paralysis of vocal cords and larynx, unilateral: Secondary | ICD-10-CM | POA: Diagnosis not present

## 2018-03-21 DIAGNOSIS — N390 Urinary tract infection, site not specified: Secondary | ICD-10-CM | POA: Diagnosis present

## 2018-03-21 DIAGNOSIS — I481 Persistent atrial fibrillation: Secondary | ICD-10-CM | POA: Diagnosis present

## 2018-03-21 DIAGNOSIS — D6489 Other specified anemias: Secondary | ICD-10-CM | POA: Diagnosis not present

## 2018-03-21 DIAGNOSIS — I517 Cardiomegaly: Secondary | ICD-10-CM | POA: Diagnosis not present

## 2018-03-21 DIAGNOSIS — Z79899 Other long term (current) drug therapy: Secondary | ICD-10-CM

## 2018-03-21 DIAGNOSIS — E871 Hypo-osmolality and hyponatremia: Secondary | ICD-10-CM | POA: Diagnosis present

## 2018-03-21 DIAGNOSIS — Z951 Presence of aortocoronary bypass graft: Secondary | ICD-10-CM

## 2018-03-21 DIAGNOSIS — Z85820 Personal history of malignant melanoma of skin: Secondary | ICD-10-CM

## 2018-03-21 DIAGNOSIS — R509 Fever, unspecified: Secondary | ICD-10-CM | POA: Diagnosis not present

## 2018-03-21 DIAGNOSIS — E878 Other disorders of electrolyte and fluid balance, not elsewhere classified: Secondary | ICD-10-CM | POA: Diagnosis present

## 2018-03-21 DIAGNOSIS — I10 Essential (primary) hypertension: Secondary | ICD-10-CM | POA: Diagnosis not present

## 2018-03-21 DIAGNOSIS — R2681 Unsteadiness on feet: Secondary | ICD-10-CM | POA: Diagnosis not present

## 2018-03-21 DIAGNOSIS — Z87891 Personal history of nicotine dependence: Secondary | ICD-10-CM | POA: Diagnosis not present

## 2018-03-21 DIAGNOSIS — M6281 Muscle weakness (generalized): Secondary | ICD-10-CM | POA: Diagnosis not present

## 2018-03-21 LAB — CBC WITH DIFFERENTIAL/PLATELET
Basophils Absolute: 0 10*3/uL (ref 0–0.1)
Basophils Relative: 0 %
Eosinophils Absolute: 0 10*3/uL (ref 0–0.7)
Eosinophils Relative: 0 %
HCT: 37.1 % (ref 35.0–47.0)
HEMOGLOBIN: 12.4 g/dL (ref 12.0–16.0)
LYMPHS ABS: 0.2 10*3/uL — AB (ref 1.0–3.6)
LYMPHS PCT: 4 %
MCH: 33.1 pg (ref 26.0–34.0)
MCHC: 33.5 g/dL (ref 32.0–36.0)
MCV: 98.6 fL (ref 80.0–100.0)
Monocytes Absolute: 0.1 10*3/uL — ABNORMAL LOW (ref 0.2–0.9)
Monocytes Relative: 3 %
NEUTROS PCT: 93 %
Neutro Abs: 4.5 10*3/uL (ref 1.4–6.5)
Platelets: 185 10*3/uL (ref 150–440)
RBC: 3.76 MIL/uL — AB (ref 3.80–5.20)
RDW: 17.4 % — ABNORMAL HIGH (ref 11.5–14.5)
WBC: 4.8 10*3/uL (ref 3.6–11.0)

## 2018-03-21 LAB — COMPREHENSIVE METABOLIC PANEL
ALBUMIN: 4.2 g/dL (ref 3.5–5.0)
ALK PHOS: 59 U/L (ref 38–126)
ALT: 17 U/L (ref 14–54)
ANION GAP: 10 (ref 5–15)
AST: 27 U/L (ref 15–41)
BUN: 17 mg/dL (ref 6–20)
CALCIUM: 9.3 mg/dL (ref 8.9–10.3)
CHLORIDE: 95 mmol/L — AB (ref 101–111)
CO2: 23 mmol/L (ref 22–32)
Creatinine, Ser: 0.95 mg/dL (ref 0.44–1.00)
GFR calc Af Amer: >60 mL/min (ref >60)
GFR calc non Af Amer: 55 mL/min — ABNORMAL LOW (ref >60)
GLUCOSE: 136 mg/dL — AB (ref 65–99)
POTASSIUM: 4.1 mmol/L (ref 3.5–5.1)
SODIUM: 128 mmol/L — AB (ref 135–145)
Total Bilirubin: 1.4 mg/dL — ABNORMAL HIGH (ref 0.3–1.2)
Total Protein: 6.9 g/dL (ref 6.5–8.1)

## 2018-03-21 LAB — LACTIC ACID, PLASMA: Lactic Acid, Venous: 1.7 mmol/L (ref 0.5–1.9)

## 2018-03-21 LAB — PROCALCITONIN: Procalcitonin: 0.47 ng/mL

## 2018-03-21 LAB — TROPONIN I: Troponin I: <0.03 ng/mL (ref <0.03)

## 2018-03-21 LAB — LIPASE, BLOOD: Lipase: 27 U/L (ref 11–51)

## 2018-03-21 LAB — PROTIME-INR
INR: 1.07
Prothrombin Time: 13.8 seconds (ref 11.4–15.2)

## 2018-03-21 MED ORDER — IPRATROPIUM BROMIDE 0.02 % IN SOLN
2.0000 mL | Freq: Four times a day (QID) | RESPIRATORY_TRACT | Status: DC | PRN
Start: 2018-03-21 — End: 2018-03-23

## 2018-03-21 MED ORDER — CEPHALEXIN 500 MG PO CAPS: 500.0000 mg | ORAL_CAPSULE | Freq: Three times a day (TID) | ORAL | 0 refills | Status: DC

## 2018-03-21 MED ORDER — FOLIC ACID 1 MG PO TABS
1.0000 mg | ORAL_TABLET | Freq: Every day | ORAL | Status: DC
  Administered 2018-03-22 – 2018-03-23 (×2): 1 mg via ORAL
  Filled 2018-03-21 (×2): qty 1

## 2018-03-21 MED ORDER — VERAPAMIL HCL 2.5 MG/ML IV SOLN
2.5000 mg | Freq: Four times a day (QID) | INTRAVENOUS | Status: DC | PRN
  Filled 2018-03-21: qty 1

## 2018-03-21 MED ORDER — AMITRIPTYLINE HCL 25 MG PO TABS
25.0000 mg | ORAL_TABLET | Freq: Every day | ORAL | Status: DC
  Administered 2018-03-22 (×2): 25 mg via ORAL
  Filled 2018-03-21 (×2): qty 1

## 2018-03-21 MED ORDER — VERAPAMIL HCL ER 180 MG PO TBCR
180.0000 mg | EXTENDED_RELEASE_TABLET | Freq: Two times a day (BID) | ORAL | Status: DC
  Filled 2018-03-21: qty 1

## 2018-03-21 MED ORDER — TRAMADOL HCL 50 MG PO TABS: 50.0000 mg | ORAL_TABLET | Freq: Four times a day (QID) | ORAL | Status: DC | PRN

## 2018-03-21 MED ORDER — FERROUS SULFATE 325 (65 FE) MG PO TABS
325.0000 mg | ORAL_TABLET | Freq: Two times a day (BID) | ORAL | Status: DC
  Administered 2018-03-22 – 2018-03-23 (×3): 325 mg via ORAL
  Filled 2018-03-21 (×3): qty 1

## 2018-03-21 MED ORDER — TRAZODONE HCL 50 MG PO TABS: 150.0000 mg | ORAL_TABLET | Freq: Every day | ORAL | Status: DC

## 2018-03-21 MED ORDER — POLYETHYLENE GLYCOL 3350 17 G PO PACK: 17.0000 g | PACK | Freq: Every day | ORAL | Status: DC

## 2018-03-21 MED ORDER — RAMIPRIL 10 MG PO CAPS
10.0000 mg | ORAL_CAPSULE | Freq: Every day | ORAL | Status: DC
  Administered 2018-03-22: 10 mg via ORAL
  Filled 2018-03-21: qty 1

## 2018-03-21 MED ORDER — RAMIPRIL 10 MG PO CAPS
10.0000 mg | ORAL_CAPSULE | Freq: Every day | ORAL | Status: DC
Start: 2018-03-21 — End: 2018-03-21
  Filled 2018-03-21: qty 1

## 2018-03-21 MED ORDER — ONDANSETRON HCL 4 MG PO TABS: 4.0000 mg | ORAL_TABLET | Freq: Four times a day (QID) | ORAL | Status: DC | PRN

## 2018-03-21 MED ORDER — SODIUM CHLORIDE 0.9 % IV SOLN
1.0000 g | Freq: Once | INTRAVENOUS | Status: AC
  Administered 2018-03-21: 1 g via INTRAVENOUS
  Filled 2018-03-21: qty 10

## 2018-03-21 MED ORDER — ONDANSETRON HCL 4 MG/2ML IJ SOLN: 4.0000 mg | Freq: Four times a day (QID) | INTRAMUSCULAR | Status: DC | PRN

## 2018-03-21 MED ORDER — DOCUSATE SODIUM 100 MG PO CAPS: 100.0000 mg | ORAL_CAPSULE | Freq: Two times a day (BID) | ORAL | Status: DC

## 2018-03-21 MED ORDER — ACETAMINOPHEN 500 MG PO TABS
1000.0000 mg | ORAL_TABLET | Freq: Once | ORAL | Status: AC
  Administered 2018-03-21: 1000 mg via ORAL
  Filled 2018-03-21: qty 2

## 2018-03-21 MED ORDER — VITAMIN B-12 1000 MCG PO TABS
1000.0000 ug | ORAL_TABLET | Freq: Every day | ORAL | Status: DC
  Administered 2018-03-22 – 2018-03-23 (×2): 1000 ug via ORAL
  Filled 2018-03-21 (×2): qty 1

## 2018-03-21 MED ORDER — SODIUM CHLORIDE 0.9 % IV BOLUS
2000.0000 mL | Freq: Once | INTRAVENOUS | Status: AC
  Administered 2018-03-21: 2000 mL via INTRAVENOUS

## 2018-03-21 MED ORDER — SPIRONOLACTONE 25 MG PO TABS: 25.0000 mg | ORAL_TABLET | Freq: Every morning | ORAL | Status: DC

## 2018-03-21 MED ORDER — PANTOPRAZOLE SODIUM 40 MG PO TBEC
40.0000 mg | DELAYED_RELEASE_TABLET | Freq: Two times a day (BID) | ORAL | Status: DC
  Administered 2018-03-22 – 2018-03-23 (×4): 40 mg via ORAL
  Filled 2018-03-21 (×4): qty 1

## 2018-03-21 MED ORDER — ACETAMINOPHEN 650 MG RE SUPP: 650.0000 mg | Freq: Four times a day (QID) | RECTAL | Status: DC | PRN

## 2018-03-21 MED ORDER — VITAMIN D3 25 MCG (1000 UNIT) PO TABS: 2000.0000 [IU] | ORAL_TABLET | ORAL | Status: DC

## 2018-03-21 MED ORDER — RENA-VITE PO TABS
1.0000 | ORAL_TABLET | Freq: Every day | ORAL | Status: DC
  Administered 2018-03-22 – 2018-03-23 (×2): 1 via ORAL
  Filled 2018-03-21 (×2): qty 1

## 2018-03-21 MED ORDER — TRAZODONE HCL 100 MG PO TABS: 250.0000 mg | ORAL_TABLET | Freq: Every day | ORAL | Status: DC

## 2018-03-21 MED ORDER — EZETIMIBE 10 MG PO TABS: 10.0000 mg | ORAL_TABLET | Freq: Every day | ORAL | Status: DC

## 2018-03-21 MED ORDER — ENOXAPARIN SODIUM 40 MG/0.4ML ~~LOC~~ SOLN: 40.0000 mg | SUBCUTANEOUS | Status: DC

## 2018-03-21 MED ORDER — OMEGA-3-ACID ETHYL ESTERS 1 G PO CAPS: 1000.0000 mg | ORAL_CAPSULE | Freq: Every day | ORAL | Status: DC

## 2018-03-21 MED ORDER — MOMETASONE FURO-FORMOTEROL FUM 200-5 MCG/ACT IN AERO
2.0000 | INHALATION_SPRAY | Freq: Two times a day (BID) | RESPIRATORY_TRACT | Status: DC
  Administered 2018-03-22 – 2018-03-23 (×3): 2 via RESPIRATORY_TRACT
  Filled 2018-03-21: qty 8.8

## 2018-03-21 MED ORDER — SODIUM CHLORIDE 0.9 % IV SOLN
INTRAVENOUS | Status: AC
  Administered 2018-03-22 (×2): via INTRAVENOUS

## 2018-03-21 MED ORDER — SODIUM CHLORIDE 0.9 % IV SOLN
1.0000 g | INTRAVENOUS | Status: DC
  Filled 2018-03-21: qty 10

## 2018-03-21 MED ORDER — MECLIZINE HCL 25 MG PO TABS
25.0000 mg | ORAL_TABLET | Freq: Three times a day (TID) | ORAL | Status: DC | PRN
  Filled 2018-03-21: qty 1

## 2018-03-21 MED ORDER — LORATADINE 10 MG PO TABS
10.0000 mg | ORAL_TABLET | Freq: Every day | ORAL | Status: DC
  Administered 2018-03-22 – 2018-03-23 (×2): 10 mg via ORAL
  Filled 2018-03-21 (×2): qty 1

## 2018-03-21 MED ORDER — EZETIMIBE 10 MG PO TABS
10.0000 mg | ORAL_TABLET | Freq: Every day | ORAL | Status: DC
  Administered 2018-03-22 (×2): 10 mg via ORAL
  Filled 2018-03-21 (×2): qty 1

## 2018-03-21 MED ORDER — COQ-10 200 MG PO CAPS: 200.0000 mg | ORAL_CAPSULE | Freq: Every day | ORAL | Status: DC

## 2018-03-21 MED ORDER — VITAMIN C 500 MG PO TABS
500.0000 mg | ORAL_TABLET | Freq: Every day | ORAL | Status: DC
  Administered 2018-03-22 (×2): 500 mg via ORAL
  Filled 2018-03-21 (×3): qty 1

## 2018-03-21 MED ORDER — VERAPAMIL HCL ER 180 MG PO TBCR
180.0000 mg | EXTENDED_RELEASE_TABLET | ORAL | Status: AC
  Administered 2018-03-22: 180 mg via ORAL
  Filled 2018-03-21: qty 1

## 2018-03-21 MED ORDER — ACETAMINOPHEN 325 MG PO TABS: 650.0000 mg | ORAL_TABLET | Freq: Four times a day (QID) | ORAL | Status: DC | PRN

## 2018-03-21 NOTE — Telephone Encounter (Signed)
I do not have results on this pt. Did you speak with her?

## 2018-03-21 NOTE — Telephone Encounter (Signed)
Pt aware.

## 2018-03-21 NOTE — H&P (Signed)
[facility] [place] NAME: Charlotte Wagner    MR#:  [id number]  DATE OF BIRTH:  05/26/1936  DATE OF ADMISSION:  03/21/2018  PRIMARY CARE PHYSICIAN: [name], MD   REQUESTING/REFERRING PHYSICIAN: Dr. Darel Hong  CHIEF COMPLAINT:   Chief Complaint  Patient presents with  . Shortness of Breath    HISTORY OF PRESENT ILLNESS:  Charlotte Wagner  is a 82 y.o. female with multiple medical problems including CAD status post CABG, A. fib Eliquis has been held for a month due to GI bleed, CKD, GERD, depression, carotid disease, arthritis, 3 of skin cancer who is from [facility] independent living is brought in secondary to chills, weakness. Patient had hemorrhoid banding done couple months ago, admitted last month for lower GI bleed.  Her Eliquis has been on hold since then.  She was doing well up until last week when she started having dysuria and increased frequency of urination.  She has seen her PCP and urine cultures were done.  She has been having nausea and ongoing chills for the last couple of days.  Keflex prescription was called in and patient had just taken 2 doses with no improvement.  Urine cultures came back with E. coli resistant to most oral antibiotics including Keflex.  It is sensitive to nitrofurantoin and IV antibiotics.  Patient complains of extreme weakness, has been tachycardic here in the emergency room and has elevated procalcitonin.  She is being admitted for UTI.  PAST MEDICAL HISTORY:   Past Medical History:  Diagnosis Date  . Allergy   . Arthritis    s/p bilateral knees and left hip replacement  . Asthma   . CAD (coronary artery disease)    a. 12/1996 s/p CABG x4 ([facility]);  b. 2005 Pt reports stress test & cath, which revealed patent grafts.  . Cancer ([place])    melanoma right arm  . Carotid arterial disease ([place])    a. 04/2015 Carotid U/S: <50% bilat ICA stenosis.  . Chronic kidney disease   . Colon polyps    H/O  . Depression   . GERD (gastroesophageal reflux disease)    h/o hiatal hernia  . Headache    migraines in past  . Heart murmur    a. 04/2011 Echo: EF 55-60%, bilat atrial enlargement, mild to mod TR.  [name] History of chicken pox   . History of hiatal hernia   . Hx of migraines    rare now  . Hx: UTI (urinary tract infection)   . Hyperlipidemia    a. Statin intolerant -->on zetia.  . Hypertension   . Hypertensive heart disease   . Lichen planus   . Melanoma ([place])   . Palpitations    a. rare PVC's and h/o SVT.  [name] PMR (polymyalgia rheumatica) (H[facility])    h/o in setting of crestor usage.  [name] PSVT (paroxysmal supraventricular tachycardia) ([place])   . PUD (peptic ulcer disease)    remote history  . Raynaud's phenomenon   . Spinal stenosis   . Urine incontinence    H/O  . Vitamin D deficiency     PAST SURGICAL HISTORY:   Past Surgical History:  Procedure Laterality Date  . ADENOIDECTOMY     age 51  . BACK SURGERY     L3-L5  . BILATERAL CARPAL TUNNEL RELEASE    . BREAST BIOPSY Right    bx x 3 neg  . BREAST SURGERY Right  biopsy x 3 (all benign)  . CARDIAC CATHETERIZATION N/A 06/01/2016   Procedure: LEFT HEART CATH AND CORS/GRAFTS ANGIOGRAPHY;  Surgeon: Wellington Hampshire, MD;  Location: [place] [facility];  Service: Cardiovascular;  Laterality: N/A;  . CARDIAC CATHETERIZATION N/A 06/01/2016   Procedure: Coronary Stent Intervention;  Surgeon: Wellington Hampshire, MD;  Location: [place] [facility];  Service: Cardiovascular;  Laterality: N/A;  . CARDIOVERSION N/A 03/08/2017   Procedure: CARDIOVERSION;  Surgeon: Wellington Hampshire, MD;  Location: [facility] ORS;  Service: Cardiovascular;  Laterality: N/A;  . CATARACT EXTRACTION W/PHACO Right 01/04/2016   Procedure: CATARACT EXTRACTION PHACO AND INTRAOCULAR LENS PLACEMENT ([place]);  Surgeon: Leandrew Koyanagi, MD;  Location: [place];  Service: Ophthalmology;  Laterality: Right;  MALYUGIN  . CATARACT EXTRACTION W/PHACO Left 01/25/2016    Procedure: CATARACT EXTRACTION PHACO AND INTRAOCULAR LENS PLACEMENT ([place]) left eye;  Surgeon: Leandrew Koyanagi, MD;  Location: [place];  Service: Ophthalmology;  Laterality: Left;  MALYUGIN SHUGARCAINE  . CHOLECYSTECTOMY  90's  . COLONOSCOPY WITH PROPOFOL N/A 05/03/2015   Procedure: COLONOSCOPY WITH PROPOFOL;  Surgeon: Lollie Sails, MD;  Location: [facility] ENDOSCOPY;  Service: Endoscopy;  Laterality: N/A;  . CORONARY ARTERY BYPASS GRAFT  98   4 vessel  . ESOPHAGOGASTRODUODENOSCOPY N/A 03/22/2015   Procedure: ESOPHAGOGASTRODUODENOSCOPY ([facility]);  Surgeon: Lollie Sails, MD;  Location[facility] ENDOSCOPY;  Service: Endoscopy;  Laterality: N/A;  . HEMORRHOID BANDING    . HEMORRHOID SURGERY N/A 02/17/2018   Procedure: HEMORRHOIDECTOMY;  Surgeon: Robert Bellow, MD;  Location: [facility] ORS;  Service: General;  Laterality: N/A;  . JOINT REPLACEMENT     BILATERAL KNEE REPLACEMENTS  . KNEE ARTHROSCOPY W/ OATS PROCEDURE     Lt knee (9/01), Rt knee (3/11), Lt hip (5/10)  . TOTAL HIP ARTHROPLASTY      SOCIAL HISTORY:   Social History   Tobacco Use  . Smoking status: Former Research scientist (life sciences)  . Smokeless tobacco: Never Used  . Tobacco comment: quit 44+ yrs ago  Substance Use Topics  . Alcohol use: No    Alcohol/week: 0.0 oz    FAMILY HISTORY:   Family History  Problem Relation Age of Onset  . Thyroid disease Mother        graves disease  . Heart disease Father        rheumatic heart  . Alcohol abuse Father   . Depression Father   . Lung cancer Sister   . Depression Daughter   . Thyroid disease Daughter        hashimoto  . Depression Son   . Stroke Maternal Grandmother   . Depression Maternal Grandmother   . Hypertension Maternal Grandfather   . Hypertension Paternal Grandfather   . Seizures Son   . Breast cancer Neg Hx     DRUG ALLERGIES:   Allergies  Allergen Reactions  . Beta Adrenergic Blockers Shortness Of Breath    DEPRESSION/HYPOTENSION  . Brilinta [Ticagrelor]  Shortness Of Breath    Caused AFIB  . Albuterol     rigors  . Aspirin Other (See Comments)    Heartburn   . Nsaids Other (See Comments)    ULCER HISTORY & CURRENTLY ON BLOOD THINNERS  . Penicillins Hives and Other (See Comments)    Has patient had a PCN reaction causing immediate rash, facial/tongue/throat swelling, SOB or lightheadedness with hypotension: No Has patient had a PCN reaction causing severe rash involving mucus membranes or skin necrosis: No Has patient had a PCN reaction that  required hospitalization No Has patient had a PCN reaction occurring within the last 10 years: No If all of the above answers are "NO", then may proceed with Cephalosporin use.   . Statins Other (See Comments)    Muscle weakness  . Sulfa Antibiotics Rash    At mouth  . Sulfasalazine Rash    At mouth  . Tetracycline Rash  . Tetracyclines & Related Rash    REVIEW OF SYSTEMS:   Review of Systems  Constitutional: Positive for chills and malaise/fatigue. Negative for fever and weight loss.  HENT: Negative for ear discharge, ear pain, hearing loss and nosebleeds.   Eyes: Negative for blurred vision, double vision and photophobia.  Respiratory: Negative for cough, hemoptysis, shortness of breath and wheezing.   Cardiovascular: Negative for chest pain, palpitations, orthopnea and leg swelling.  Gastrointestinal: Positive for abdominal pain. Negative for constipation, diarrhea, heartburn, melena, nausea and vomiting.  Genitourinary: Positive for dysuria and urgency. Negative for frequency and hematuria.  Musculoskeletal: Positive for myalgias. Negative for back pain and neck pain.  Skin: Negative for rash.  Neurological: Negative for dizziness, tingling, sensory change, speech change, focal weakness and headaches.  Endo/Heme/Allergies: Does not bruise/bleed easily.  Psychiatric/Behavioral: Negative for depression.    MEDICATIONS AT HOME:   Prior to Admission medications   Medication Sig Start  Date End Date Taking? Authorizing Provider  acetaminophen (TYLENOL) 500 MG tablet Take 1,000 mg by mouth 2 (two) times daily.     [provider]  ADVAIR DISKUS 250-50 MCG/DOSE AEPB INHALE 1 PUFF BY MOUTH TWICE DAILY 02/25/18   [name], MD  amitriptyline (ELAVIL) 25 MG tablet take 1 to 2 tablets by mouth at bedtime Patient taking differently: Take 25 mg by mouth at bedtime 09/13/17   [name], MD  ATROVENT [facility] 17 MCG/ACT inhaler inhale 2 puffs INTO THE LUNGS every 6 hours if needed for wheezing 04/25/17   [name], MD  b complex vitamins tablet Take 1 tablet by mouth daily.    [provider]  cephALEXin (KEFLEX) 500 MG capsule Take 1 capsule (500 mg total) by mouth 3 (three) times daily. 03/21/18   [name], MD  Cholecalciferol (VITAMIN D3) 2000 UNITS TABS Take 2,000 Units by mouth 2 (two) times a week.     [provider]  Coenzyme Q10 (COQ-10) 200 MG CAPS Take 200 mg by mouth daily.     [provider]  ezetimibe (ZETIA) 10 MG tablet TAKE 1 TABLET BY MOUTH ONCE DAILY 01/29/18   [name], MD  ferrous sulfate 325 (65 FE) MG tablet Take 1 tablet (325 mg total) by mouth 2 (two) times daily with a meal. 02/18/18   Vaughan Basta, MD  folic acid (FOLVITE) 1 MG tablet Take 1 tablet (1 mg total) by mouth daily. 01/13/18   [name], MD  hydrocortisone 2.5 % cream apply to VULVA twice a day AT 10AM AND 4 PM Patient taking differently: Apply to VULVA at bedtime as needed with miconazole for lichen planus 0/2/54   [name], MD  ketoconazole (NIZORAL) 2 % cream Apply 1 application topically daily. Patient taking differently: Apply 1 application topically daily as needed (for crack bleeding fingers.).  06/15/14   [name], MD  loratadine (CLARITIN) 10 MG tablet Take 10 mg by mouth daily.    [provider]  meclizine (ANTIVERT) 25 MG tablet Take 25 mg by mouth 3 (three) times daily as needed for dizziness.     [provider]  miconazole (MICOTIN) 2 % cream Apply 1 application topically at bedtime as needed (with hydrocortisone for lichen planus).     [provider]  Multiple Vitamins-Minerals (HAIR/SKIN/NAILS PO) Take 1 tablet by mouth daily.    [provider]  nitroGLYCERIN (NITROSTAT) 0.4 MG SL tablet Place 1 tablet (0.4 mg total) under the tongue every 5 (five) minutes as needed for chest pain. Place 1 tablet (0.4 mg total) under the tongue every 5 (five) minutes , 3 times as needed for chest pain. Patient not taking: Reported on 03/18/2018 06/07/16   Nicholes Mango, MD  Omega-3 Fatty Acids (FISH OIL) 1000 MG CAPS Take 1,000 mg by mouth daily.     [provider]  pantoprazole (PROTONIX) 40 MG tablet Take 1 tablet (40 mg total) by mouth 2 (two) times daily. 02/07/18   [name], MD  polyethylene glycol powder (GLYCOLAX/MIRALAX) powder Take 8.5 g by mouth daily.     [provider]  ramipril (ALTACE) 10 MG capsule Take 10 mg by mouth daily. 03/05/18   [provider]  spironolactone (ALDACTONE) 25 MG tablet Take 1 tablet (25 mg total) by mouth every morning. 02/07/18   [name], MD  traMADol (ULTRAM) 50 MG tablet Take 1 tablet (50 mg total) by mouth daily as needed. Patient taking differently: Take 50 mg by mouth daily as needed for severe pain.  03/12/17   [name], MD  traZODone (DESYREL) 150 MG tablet TAKE 1 AND 2/3 TO 2 TABLETS BY MOUTH AT BEDTIME AS DIRECTED Patient taking differently: Take 250 mg by mouth at bedtime (1 and 2/3 tablets) 01/20/18   [name], MD  verapamil (CALAN-SR) 180 MG CR tablet take 1 tablet by mouth twice a day 10/31/17   [name], MD  vitamin B-12 (CYANOCOBALAMIN) 1000 MCG tablet Take 1,000 mcg by mouth daily.    [provider]  vitamin C (ASCORBIC ACID) 500 MG tablet Take 500 mg by mouth at bedtime.    [provider]      VITAL SIGNS:  Blood pressure 120/64, pulse (!) 154,  temperature (!) 101.2 F (38.4 C), temperature source Oral, height 5\' 3"  (1.6 m), weight 81.6 kg (180 lb), SpO2 97 %.  PHYSICAL EXAMINATION:   Physical Exam  GENERAL:  82 y.o.-year-old patient lying in the bed with no acute distress.  EYES: Pupils equal, round, reactive to light and accommodation. No scleral icterus. Extraocular muscles intact.  HEENT: Head atraumatic, normocephalic. Oropharynx and nasopharynx clear.  NECK:  Supple, no jugular venous distention. No thyroid enlargement, no tenderness.  LUNGS: Normal breath sounds bilaterally, no wheezing, rales,rhonchi or crepitation. No use of accessory muscles of respiration. Decreased bibasilar breath sounds noted. CARDIOVASCULAR: S1, S2 rapid and irregular. No  rubs, or gallops. 2/6 soft systolic murmur present. ABDOMEN: Soft, mild discomfort in the lower abdomen, otherwise nontender, nondistended. Bowel sounds present. No organomegaly or mass.  EXTREMITIES: No pedal edema, cyanosis, or clubbing.  NEUROLOGIC: Cranial nerves II through XII are intact. Speech is slow and soft. Muscle strength 5/5 in all extremities. Sensation intact. Gait not checked. Global weakness noted. PSYCHIATRIC: The patient is alert and oriented x 3.  SKIN: No obvious rash, lesion, or ulcer.   LABORATORY PANEL:   CBC Recent Labs  Lab 03/21/18 1849  WBC 4.8  HGB 12.4  HCT 37.1  PLT 185   ------------------------------------------------------------------------------------------------------------------  Chemistries  Recent Labs  Lab 03/21/18 1849  NA 128*  K 4.1  CL 95*  CO2 23  GLUCOSE  136*  BUN 17  CREATININE 0.95  CALCIUM 9.3  AST 27  ALT 17  ALKPHOS 59  BILITOT 1.4*   ------------------------------------------------------------------------------------------------------------------  Cardiac Enzymes Recent Labs  Lab 03/21/18 1849  TROPONINI <0.03    ------------------------------------------------------------------------------------------------------------------  RADIOLOGY:  Dg Chest Port 1 View  Result Date: 03/21/2018 CLINICAL DATA:  Code sepsis. EXAM: PORTABLE CHEST 1 VIEW COMPARISON:  CT chest 03/19/2017. Single-view of the chest 02/17/2017. FINDINGS: Lung volumes are low. Lungs are clear. There is cardiomegaly. The patient is status post CABG. No pneumothorax or pleural effusion. Aortic atherosclerosis noted. IMPRESSION: Cardiomegaly without acute disease. Atherosclerosis. Electronically Signed   By: Inge Rise M.D.   On: 03/21/2018 19:12    EKG:   Orders placed or performed during the hospital encounter of 03/21/18  . ED EKG 12-Lead  . ED EKG 12-Lead  . EKG 12-Lead  . EKG 12-Lead    IMPRESSION AND PLAN:   Charlotte Wagner  is a 82 y.o. female with multiple medical problems including CAD status post CABG, A. fib Eliquis has been held for a month due to GI bleed, CKD, GERD, depression, carotid disease, arthritis, 3 of skin cancer who is from [facility] independent living is brought in secondary to chills, weakness.  1. Acute cystitis- repeat UA and cultures ordered -due to rigors, blood cultures have been drawn. -Urine cultures from 3 days ago growing E. coli resistant to Keflex but sensitive to several IV antibiotics and also Macrobid. -Until cultures are back, will start Rocephin.  Patient has tolerated cephalosporins well in the past according to PCP though listed as allergy.  2.  A. fib with RVR-likely worsened due to underlying infection.  IV fluids -Continue verapamil and PRN IV calcium channel blockers. -Was on Eliquis up until last month, held due to GI bleed.  3.  Hyponatremia and hypochloremia-secondary to hypovolemia.  IV fluids and monitor  4.  CAD-stable.  Continue home medications.  5.  COPD-continue inhalers.  No wheezing, no indication for systemic steroids.  6.  GERD-on Protonix  7.  DVT  prophylaxis-on Lovenox  Physical therapy consulted   All the records are reviewed and case discussed with ED provider. Management plans discussed with the patient, family and they are in agreement.  CODE STATUS: Full Code  TOTAL TIME TAKING CARE OF THIS PATIENT: 50 minutes.    Gladstone Lighter M.D on 03/21/2018 at 8:09 PM  Between 7am to 6pm - Pager - [phone]  After 6pm go to [email or web address] - password [facility]  Office  [phone]  CC: Primary care physician; [name], MD

## 2018-03-21 NOTE — ED Notes (Signed)
Labs sent at this time. This RN attempted IV 3x will hand off the need for 2nd IV

## 2018-03-21 NOTE — Progress Notes (Signed)

## 2018-03-21 NOTE — ED Triage Notes (Signed)
Pt arrives via [facility] from [facility] independent living. PT has 20 g lAC on arrival. Pt is A/O x4.

## 2018-03-21 NOTE — Telephone Encounter (Signed)
I notified her of her urine culture and sent in abx.  Please also notify her that her hgb is better.  Continue iron.  Sodium stable.  Still slightly decreased, but stable.  We will follow.
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
## 2018-03-21 NOTE — ED Notes (Signed)
Pt on bed pan but urine went into brief.  Pt had a suction catheter Lm edt

## 2018-03-21 NOTE — Progress Notes (Signed)
rx sent in for keflex x 1 week.  Instructed to take probiotic with abx and for two weeks after completing the abx.  She has taken cephalosporins previously and tolerated.  No allergy.

## 2018-03-21 NOTE — Progress Notes (Signed)
CODE SEPSIS - PHARMACY COMMUNICATION  **Broad Spectrum Antibiotics should be administered within 1 hour of Sepsis diagnosis**  Time Code Sepsis Called/Page Received: 1847  Antibiotics Ordered: Rocephin  Time of 1st antibiotic administration: 1940  Additional action taken by pharmacy: none required  If necessary, Name of Provider/Nurse Contacted: N/A    Dallie Piles ,PharmD Clinical Pharmacist  03/21/2018  7:41 PM

## 2018-03-22 LAB — BASIC METABOLIC PANEL
Anion gap: 9 (ref 5–15)
BUN: 23 mg/dL — AB (ref 6–20)
CALCIUM: 8.3 mg/dL — AB (ref 8.9–10.3)
CO2: 19 mmol/L — AB (ref 22–32)
Chloride: 99 mmol/L — ABNORMAL LOW (ref 101–111)
Creatinine, Ser: 1.29 mg/dL — ABNORMAL HIGH (ref 0.44–1.00)
GFR calc Af Amer: 44 mL/min — ABNORMAL LOW (ref >60)
GFR, EST NON AFRICAN AMERICAN: 38 mL/min — AB (ref >60)
GLUCOSE: 176 mg/dL — AB (ref 65–99)
Potassium: 4.1 mmol/L (ref 3.5–5.1)
Sodium: 127 mmol/L — ABNORMAL LOW (ref 135–145)

## 2018-03-22 LAB — CBC
HEMATOCRIT: 31.1 % — AB (ref 35.0–47.0)
HEMOGLOBIN: 10.6 g/dL — AB (ref 12.0–16.0)
MCH: 33.8 pg (ref 26.0–34.0)
MCHC: 34 g/dL (ref 32.0–36.0)
MCV: 99.4 fL (ref 80.0–100.0)
Platelets: 137 10*3/uL — ABNORMAL LOW (ref 150–440)
RBC: 3.13 MIL/uL — ABNORMAL LOW (ref 3.80–5.20)
RDW: 18.1 % — AB (ref 11.5–14.5)
WBC: 7.8 10*3/uL (ref 3.6–11.0)

## 2018-03-22 LAB — BLOOD CULTURE ID PANEL (REFLEXED)
ACINETOBACTER BAUMANNII: NOT DETECTED
CANDIDA ALBICANS: NOT DETECTED
CANDIDA GLABRATA: NOT DETECTED
CANDIDA TROPICALIS: NOT DETECTED
CARBAPENEM RESISTANCE: NOT DETECTED
Candida krusei: NOT DETECTED
Candida parapsilosis: NOT DETECTED
ENTEROBACTERIACEAE SPECIES: DETECTED — AB
ENTEROCOCCUS SPECIES: NOT DETECTED
Enterobacter cloacae complex: NOT DETECTED
Escherichia coli: DETECTED — AB
HAEMOPHILUS INFLUENZAE: NOT DETECTED
KLEBSIELLA OXYTOCA: NOT DETECTED
KLEBSIELLA PNEUMONIAE: NOT DETECTED
Listeria monocytogenes: NOT DETECTED
Neisseria meningitidis: NOT DETECTED
PSEUDOMONAS AERUGINOSA: NOT DETECTED
Proteus species: NOT DETECTED
SERRATIA MARCESCENS: NOT DETECTED
STREPTOCOCCUS PNEUMONIAE: NOT DETECTED
Staphylococcus aureus (BCID): NOT DETECTED
Staphylococcus species: NOT DETECTED
Streptococcus agalactiae: NOT DETECTED
Streptococcus pyogenes: NOT DETECTED
Streptococcus species: NOT DETECTED

## 2018-03-22 LAB — TSH: TSH: 0.787 u[IU]/mL (ref 0.350–4.500)

## 2018-03-22 MED ORDER — RISAQUAD PO CAPS
2.0000 | ORAL_CAPSULE | Freq: Three times a day (TID) | ORAL | Status: DC
  Administered 2018-03-22 – 2018-03-23 (×3): 2 via ORAL
  Filled 2018-03-22 (×3): qty 2

## 2018-03-22 MED ORDER — TRAZODONE HCL 100 MG PO TABS
250.0000 mg | ORAL_TABLET | Freq: Every day | ORAL | Status: DC
  Administered 2018-03-22 (×2): 250 mg via ORAL
  Filled 2018-03-22 (×2): qty 3

## 2018-03-22 MED ORDER — SODIUM CHLORIDE 0.9 % IV SOLN
2.0000 g | INTRAVENOUS | Status: DC
  Administered 2018-03-22 – 2018-03-23 (×2): 2 g via INTRAVENOUS
  Filled 2018-03-22: qty 2
  Filled 2018-03-22: qty 20

## 2018-03-22 MED ORDER — SODIUM CHLORIDE 0.9 % IV BOLUS
500.0000 mL | Freq: Once | INTRAVENOUS | Status: AC
  Administered 2018-03-22: 500 mL via INTRAVENOUS

## 2018-03-22 MED ORDER — CEFDINIR 300 MG PO CAPS
300.0000 mg | ORAL_CAPSULE | Freq: Two times a day (BID) | ORAL | Status: DC
  Filled 2018-03-22 (×2): qty 1

## 2018-03-22 NOTE — Progress Notes (Signed)
PHARMACY - PHYSICIAN COMMUNICATION CRITICAL VALUE ALERT - BLOOD CULTURE IDENTIFICATION (BCID)  No results found for this or any previous visit.   09:58 Lab calls to report BCID 2 of 4 bottles (single set) with enterobacteriaceae E. Coli, KPC not detected. Patient also has UCx from 6/11 with E. Coli and is on rocephin for UTI.   Name of physician (or Provider) Contacted: Dr. Benjie Karvonen x 1 at 10:00  Changes to prescribed antibiotics required: The E. Coli in the blood likely has same resistance pattern as in the urine - continue Rocephin but increase to 2 gm IV Q24H for bacteremia (indication based dosing).   Laural Benes, Pharm.D., BCPS Clinical Pharmacist 03/22/2018  10:00 AM

## 2018-03-22 NOTE — Evaluation (Signed)
Physical Therapy Evaluation Patient Details Name: Charlotte Wagner, Dr. MRN: [id number] DOB: 1936/01/15 Today's Date: 03/22/2018   History of Present Illness  82 y.o. female with multiple medical problems including CAD status post CABG, A. fib Eliquis has been held for a month due to GI bleed, CKD, GERD, depression, carotid disease, arthritis, 3 of skin cancer who is from [facility] independent living is brought in secondary to chills, weakness. Admitted with UTI.   Clinical Impression  Pt did well with PT exam showing good confidence with mobility, transfers and ambulation.  She did have relatively slow and labored ambulation with increased reliance on walker and forward flexed posture which she was able to improve transiently with ~10 minutes of gait training apart from the PT exam.  Pt confident that she can go home safely and continue using the NewStep and doing some of the exercises she has previously been taught and will not require HHPT, etc once she is able to go home.     Follow Up Recommendations No PT follow up, will maintain on PT caseload in hospital to insure maximizing strength/activity tolerance before returning home    Equipment Recommendations  None recommended by PT    Recommendations for Other Services       Precautions / Restrictions Precautions Precautions: Fall Restrictions Weight Bearing Restrictions: No      Mobility  Bed Mobility Overal bed mobility: Independent                Transfers Overall transfer level: Independent Equipment used: Rolling walker (2 wheeled)             General transfer comment: Pt able to safely get to standing w/o assist, reliant on UEs, but good safety and confidence  Ambulation/Gait Ambulation/Gait assistance: Supervision Gait Distance (Feet): 100 Feet Assistive device: Rolling walker (2 wheeled)       General Gait Details: Pt with forward flexed posture and reliance on UEs/walker but ultimately was safe and  reports being near her baseline.   Stairs            Wheelchair Mobility    Modified Rankin (Stroke Patients Only)       Balance Overall balance assessment: Modified Independent                                           Pertinent Vitals/Pain Pain Assessment: (chronic pain, especially in low back. No acute pain.)    Home Living Family/patient expects to be discharged to:: Private residence(independent living at [facility] ) Living Arrangements: Alone Available Help at Discharge: Neighbor Type of Home: Apartment         Home Equipment: Walker - 4 wheels      Prior Function Level of Independence: Independent         Comments: Pt reports using NewStep and doing exercises at least 3x/wk, generally has been able to stay active, drive, etc     Hand Dominance        Extremity/Trunk Assessment   Upper Extremity Assessment Upper Extremity Assessment: Generalized weakness(age appropriate limitations)    Lower Extremity Assessment Lower Extremity Assessment: Generalized weakness(age appropriate limitations)       Communication   Communication: No difficulties  Cognition Arousal/Alertness: Awake/alert Behavior During Therapy: WFL for tasks assessed/performed Overall Cognitive Status: Within Functional Limits for tasks assessed  General Comments      Exercises     Assessment/Plan    PT Assessment Patient needs continued PT services  PT Problem List Decreased strength;Decreased activity tolerance;Decreased range of motion;Decreased mobility       PT Treatment Interventions DME instruction;Gait training;Functional mobility training;Therapeutic activities;Therapeutic exercise;Balance training;Neuromuscular re-education;Patient/family education    PT Goals (Current goals can be found in the Care Plan section)  Acute Rehab PT Goals Patient Stated Goal: go home PT Goal  Formulation: With patient Time For Goal Achievement: 04/05/18 Potential to Achieve Goals: Good    Frequency Min 2X/week   Barriers to discharge        Co-evaluation               AM-PAC PT "6 Clicks" Daily Activity  Outcome Measure Difficulty turning over in bed (including adjusting bedclothes, sheets and blankets)?: None Difficulty moving from lying on back to sitting on the side of the bed? : None Difficulty sitting down on and standing up from a chair with arms (e.g., wheelchair, bedside commode, etc,.)?: None Help needed moving to and from a bed to chair (including a wheelchair)?: None Help needed walking in hospital room?: None Help needed climbing 3-5 steps with a railing? : A Little 6 Click Score: 23    End of Session Equipment Utilized During Treatment: Gait belt Activity Tolerance: Patient tolerated treatment well Patient left: with chair alarm set;with call bell/phone within reach Nurse Communication: Mobility status PT Visit Diagnosis: Muscle weakness (generalized) (M62.81)    Time: 0623-7628 PT Time Calculation (min) (ACUTE ONLY): 29 min   Charges:   PT Evaluation $PT Eval Low Complexity: 1 Low PT Treatments $Gait Training: 8-22 mins   PT G Codes:        Kreg Shropshire, DPT 03/22/2018, 12:20 PM

## 2018-03-22 NOTE — Clinical Social Work Note (Signed)
CSW received consult that the patient is from independent living. The CSW is not necessary for discharge planning unless the patient is planning to transfer to SNF. CSW is signing off. Please consult should the patient wish to discharge to SNF.  Santiago Bumpers, MSW, Latanya Presser [phone]

## 2018-03-22 NOTE — Progress Notes (Signed)
[facility] [place] NAME: Charlotte Wagner    MR#:  [id number]  DATE OF BIRTH:  1935-11-23  SUBJECTIVE:   Patient doing ok this am    REVIEW OF SYSTEMS:    Review of Systems  Constitutional: Negative for fever, chills weight loss HENT: Negative for ear pain, nosebleeds, congestion, facial swelling, rhinorrhea, neck pain, neck stiffness and ear discharge.   Respiratory: Negative for cough, shortness of breath, wheezing  Cardiovascular: Negative for chest pain, palpitations and leg swelling.  Gastrointestinal: Negative for heartburn, abdominal pain, vomiting, diarrhea or consitpation Genitourinary: Negative for dysuria, urgency, frequency, hematuria Musculoskeletal: Negative for back pain or joint pain Neurological: Negative for dizziness, seizures, syncope, focal weakness,  numbness and headaches.  Hematological: Does not bruise/bleed easily.  Psychiatric/Behavioral: Negative for hallucinations, confusion, dysphoric mood    Tolerating Diet:yes      DRUG ALLERGIES:   Allergies  Allergen Reactions  . Beta Adrenergic Blockers Shortness Of Breath    DEPRESSION/HYPOTENSION  . Brilinta [Ticagrelor] Shortness Of Breath    Caused AFIB  . Albuterol     rigors  . Aspirin Other (See Comments)    Heartburn   . Nsaids Other (See Comments)    ULCER HISTORY & CURRENTLY ON BLOOD THINNERS  . Penicillins Hives and Other (See Comments)    Has patient had a PCN reaction causing immediate rash, facial/tongue/throat swelling, SOB or lightheadedness with hypotension: No Has patient had a PCN reaction causing severe rash involving mucus membranes or skin necrosis: No Has patient had a PCN reaction that required hospitalization No Has patient had a PCN reaction occurring within the last 10 years: No If all of the above answers are "NO", then may proceed with Cephalosporin use.   . Statins Other (See Comments)    Muscle weakness  . Sulfa Antibiotics  Rash    At mouth  . Sulfasalazine Rash    At mouth  . Tetracycline Rash  . Tetracyclines & Related Rash    VITALS:  Blood pressure (!) 84/59, pulse 64, temperature (!) 97.3 F (36.3 C), temperature source Oral, resp. rate 18, height 5\' 3"  (1.6 m), weight 82.7 kg (182 lb 4.8 oz), SpO2 97 %.  PHYSICAL EXAMINATION:  Constitutional: Appears well-developed and well-nourished. No distress. HENT: Normocephalic. [name] Oropharynx is clear and moist.  Eyes: Conjunctivae and EOM are normal. PERRLA, no scleral icterus.  Neck: Normal ROM. Neck supple. No JVD. No tracheal deviation. CVS: RRR, S1/S2 +, 2/6 murmurs, no gallops, no carotid bruit.  Pulmonary: Effort and breath sounds normal, no stridor, rhonchi, wheezes, rales.  Abdominal: Soft. BS +,  no distension, tenderness, rebound or guarding.  Musculoskeletal: Normal range of motion. No edema and no tenderness.  Neuro: Alert. CN 2-12 grossly intact. No focal deficits. Skin: Skin is warm and dry. No rash noted. Psychiatric: Normal mood and affect.      LABORATORY PANEL:   CBC Recent Labs  Lab 03/22/18 0444  WBC 7.8  HGB 10.6*  HCT 31.1*  PLT 137*   ------------------------------------------------------------------------------------------------------------------  Chemistries  Recent Labs  Lab 03/21/18 1849 03/22/18 0444  NA 128* 127*  K 4.1 4.1  CL 95* 99*  CO2 23 19*  GLUCOSE 136* 176*  BUN 17 23*  CREATININE 0.95 1.29*  CALCIUM 9.3 8.3*  AST 27  --   ALT 17  --   ALKPHOS 59  --   BILITOT 1.4*  --    ------------------------------------------------------------------------------------------------------------------  Cardiac Enzymes Recent Labs  Lab 03/21/18 1849  TROPONINI <0.03   ------------------------------------------------------------------------------------------------------------------  RADIOLOGY:  Dg Chest Port 1 View  Result Date: 03/21/2018 CLINICAL DATA:  Code sepsis. EXAM: PORTABLE CHEST 1 VIEW  COMPARISON:  CT chest 03/19/2017. Single-view of the chest 02/17/2017. FINDINGS: Lung volumes are low. Lungs are clear. There is cardiomegaly. The patient is status post CABG. No pneumothorax or pleural effusion. Aortic atherosclerosis noted. IMPRESSION: Cardiomegaly without acute disease. Atherosclerosis. Electronically Signed   By: Inge Rise M.D.   On: 03/21/2018 19:12     ASSESSMENT AND PLAN:   82 year old female with history of CAD/CABG, persistent atrial fibrillation not on on anticoagulation due to recent GI bleed who presents to the emergency room due to fevers.  1.  E. coli sepsis: Patient presents with fever, tachycardia and tachypnea.  Sepsis is due to E. coli bacteremia from E. coli UTI. Increase IV fluids and continue IV ceftriaxone  2.  Persistent atrial fibrillation with low heart rate and blood pressure this morning I have stopped verapamil for now.  There is a PRN verapamil for heart rate control if this is an issue. With recent GI bleed patient is off of anticoagulation now   3.  Hypotension due to sepsis: Stop all blood pressure medications and increase IV fluids Monitor blood pressure.  4 hyponatremia from hypovolemia and sepsis Increase IV fluids and repeat sodium level TSH was within normal limits  5.  COPD without signs of exacerbation Continue inhalers 6.  History of CAD/CABG: Holding blood pressure medications due to hypotension  Continue Zetia  7.  GERD: Continue PPI   Management plans discussed with the patient and she is in agreement.  CODE STATUS: full  TOTAL TIME TAKING CARE OF THIS PATIENT: 30 minutes.     POSSIBLE D/C tomorrow, DEPENDING ON CLINICAL CONDITION.   Izyan Ezzell M.D on 03/22/2018 at 10:08 AM  Between 7am to 6pm - Pager - [phone] After 6pm go to [email or web address] - password [email or web address]  Office  [phone]  CC: Primary care physician; [name], MD  Note: This dictation was  prepared with Dragon dictation along with smaller phrase technology. Any transcriptional errors that result from this process are unintentional.

## 2018-03-23 ENCOUNTER — Encounter: Payer: Self-pay | Admitting: Internal Medicine

## 2018-03-23 LAB — BASIC METABOLIC PANEL
ANION GAP: 8 (ref 5–15)
BUN: 27 mg/dL — ABNORMAL HIGH (ref 6–20)
CALCIUM: 8.4 mg/dL — AB (ref 8.9–10.3)
CO2: 18 mmol/L — AB (ref 22–32)
Chloride: 103 mmol/L (ref 101–111)
Creatinine, Ser: 0.89 mg/dL (ref 0.44–1.00)
Glucose, Bld: 122 mg/dL — ABNORMAL HIGH (ref 65–99)
Potassium: 4 mmol/L (ref 3.5–5.1)
SODIUM: 129 mmol/L — AB (ref 135–145)

## 2018-03-23 MED ORDER — KETOCONAZOLE 2 % EX CREA: 1.0000 "application " | TOPICAL_CREAM | Freq: Every day | CUTANEOUS | Status: DC | PRN

## 2018-03-23 MED ORDER — CEFDINIR 300 MG PO CAPS: 300.0000 mg | ORAL_CAPSULE | Freq: Two times a day (BID) | ORAL | 0 refills | Status: DC

## 2018-03-23 MED ORDER — HYDROCORTISONE 2.5 % EX CREA: TOPICAL_CREAM | CUTANEOUS | 0 refills | Status: DC

## 2018-03-23 MED ORDER — TRAMADOL HCL 50 MG PO TABS: 50.0000 mg | ORAL_TABLET | Freq: Every day | ORAL | Status: DC | PRN

## 2018-03-23 MED ORDER — AMITRIPTYLINE HCL 25 MG PO TABS: ORAL_TABLET | ORAL | 5 refills | Status: DC

## 2018-03-23 MED ORDER — VERAPAMIL HCL ER 180 MG PO TBCR
180.0000 mg | EXTENDED_RELEASE_TABLET | Freq: Two times a day (BID) | ORAL | Status: DC
  Administered 2018-03-23: 180 mg via ORAL
  Filled 2018-03-23 (×2): qty 1

## 2018-03-23 NOTE — Progress Notes (Signed)
Family Meeting Note  Advance Directive:yes  Today a meeting took place with the Patient.  The following clinical team members were present during this meeting:MD and RN  The following were discussed:Patient's diagnosis: Sepsis due to E. coli, Patient's progosis: > 12 months and Goals for treatment: DNR  Additional follow-up to be provided: DNR ordered in patient's electronic chart  Time spent during discussion: 16 minutes  Adaiah Jaskot, MD

## 2018-03-23 NOTE — Assessment & Plan Note (Signed)
Previously noted to have decreased sodium.  Recheck today to confirm improved.

## 2018-03-23 NOTE — Assessment & Plan Note (Signed)
No evidence of volume overload on exam.  Feels stable.  Followed by cardiology.

## 2018-03-23 NOTE — Assessment & Plan Note (Signed)
On iron bid now.  Recheck cbc and ferritin.

## 2018-03-23 NOTE — Assessment & Plan Note (Signed)
Breathing stable.

## 2018-03-23 NOTE — Assessment & Plan Note (Signed)
S/p hemorrhoidectomy.  Followed by Dr Bary Castilla.  Doing better. No bleeding.

## 2018-03-23 NOTE — Assessment & Plan Note (Signed)
Blood pressure under good control.  Continue same medication regimen.  Follow pressures.  Follow metabolic panel.   

## 2018-03-23 NOTE — Assessment & Plan Note (Signed)
Stable on current regimen.  Follow.   

## 2018-03-23 NOTE — Assessment & Plan Note (Signed)
Recently evaluated for persistent laryngitis.  Evaluated by ENT. Paralyzed vocal cords.  Planning for CT head, neck and chest.  Continue f/u with ENT.

## 2018-03-23 NOTE — Discharge Summary (Signed)
[facility] [place] NAME: Charlotte Wagner    MR#:  [id number]  DATE OF BIRTH:  [birth year]  DATE OF ADMISSION:  03/21/2018 ADMITTING PHYSICIAN: Gladstone Lighter, MD  DATE OF DISCHARGE: March 23, 2018  PRIMARY CARE PHYSICIAN: [name], MD    ADMISSION DIAGNOSIS:  Sepsis, due to unspecified organism [facility]) [A41.9]  DISCHARGE DIAGNOSIS:  Active Problems:   UTI (urinary tract infection)   SECONDARY DIAGNOSIS:   Past Medical History:  Diagnosis Date  . Allergy   . Arthritis    s/p bilateral knees and left hip replacement  . Asthma   . CAD (coronary artery disease)    a. 12/1996 s/p CABG x4 ([place]);  b. 2005 Pt reports stress test & cath, which revealed patent grafts.  . Cancer ([place])    melanoma right arm  . Carotid arterial disease ([place])    a. 04/2015 Carotid U/S: <50% bilat ICA stenosis.  . Chronic kidney disease   . Colon polyps    H/O  . Depression   . GERD (gastroesophageal reflux disease)    h/o hiatal hernia  . Headache    migraines in past  . Heart murmur    a. 04/2011 Echo: EF 55-60%, bilat atrial enlargement, mild to mod TR.  [name] History of chicken pox   . History of hiatal hernia   . Hx of migraines    rare now  . Hx: UTI (urinary tract infection)   . Hyperlipidemia    a. Statin intolerant -->on zetia.  . Hypertension   . Hypertensive heart disease   . Lichen planus   . Melanoma ([place])   . Palpitations    a. rare PVC's and h/o SVT.  [name] PMR (polymyalgia rheumatica) (H[facility])    h/o in setting of crestor usage.  [name] PSVT (paroxysmal supraventricular tachycardia) ([place])   . PUD (peptic ulcer disease)    remote history  . Raynaud's phenomenon   . Spinal stenosis   . Urine incontinence    H/O  . Vitamin D deficiency     HOSPITAL COURSE:  82 year old female with history of CAD/CABG, persistent atrial fibrillation not on on anticoagulation due to recent GI bleed who presents to the emergency room due to  fevers.  1.  E. coli sepsis: Patient presentedwith fever, tachycardia and tachypnea.  Sepsis is due to E. coli bacteremia from E. coli UTI. Sepsis has resolved.  2.  Persistent atrial fibrillation with low heart rate and blood pressure  Blood pressure has responded to IV fluids.  She will restart verapamil.  She will start Aldactone tomorrow.    With recent GI bleed she is currently ice to stay off of anticoagulation now   3.  Hypotension due to sepsis: This improved with IV fluids and treatment for sepsis.  She may resume outpatient hypertensive medications.  4 hyponatremia from hypovolemia and sepsis Sodium level has improved with IV fluids.  She needs repeat BMP next week her PCP. TSH was within normal limits  5.  COPD without signs of exacerbation Continue inhalers 6.  History of CAD/CABG: Holding blood pressure medications due to hypotension  Continue Zetia  7.  GERD: Continue PPI     DISCHARGE CONDITIONS AND DIET:   Stable for discharge on heart healthy diet  CONSULTS OBTAINED:    DRUG ALLERGIES:   Allergies  Allergen Reactions  . Beta Adrenergic Blockers Shortness Of Breath    DEPRESSION/HYPOTENSION  . Brilinta [Ticagrelor] Shortness Of Breath  Caused AFIB  . Albuterol     rigors  . Aspirin Other (See Comments)    Heartburn   . Nsaids Other (See Comments)    ULCER HISTORY & CURRENTLY ON BLOOD THINNERS  . Penicillins Hives and Other (See Comments)    Has patient had a PCN reaction causing immediate rash, facial/tongue/throat swelling, SOB or lightheadedness with hypotension: No Has patient had a PCN reaction causing severe rash involving mucus membranes or skin necrosis: No Has patient had a PCN reaction that required hospitalization No Has patient had a PCN reaction occurring within the last 10 years: No If all of the above answers are "NO", then may proceed with Cephalosporin use.   . Statins Other (See Comments)    Muscle weakness  . Sulfa  Antibiotics Rash    At mouth  . Sulfasalazine Rash    At mouth  . Tetracycline Rash  . Tetracyclines & Related Rash    DISCHARGE MEDICATIONS:   Allergies as of 03/23/2018      Reactions   Beta Adrenergic Blockers Shortness Of Breath   DEPRESSION/HYPOTENSION   Brilinta [ticagrelor] Shortness Of Breath   Caused AFIB   Albuterol    rigors   Aspirin Other (See Comments)   Heartburn    Nsaids Other (See Comments)   ULCER HISTORY & CURRENTLY ON BLOOD THINNERS   Penicillins Hives, Other (See Comments)   Has patient had a PCN reaction causing immediate rash, facial/tongue/throat swelling, SOB or lightheadedness with hypotension: No Has patient had a PCN reaction causing severe rash involving mucus membranes or skin necrosis: No Has patient had a PCN reaction that required hospitalization No Has patient had a PCN reaction occurring within the last 10 years: No If all of the above answers are "NO", then may proceed with Cephalosporin use.   Statins Other (See Comments)   Muscle weakness   Sulfa Antibiotics Rash   At mouth   Sulfasalazine Rash   At mouth   Tetracycline Rash   Tetracyclines & Related Rash      Medication List    STOP taking these medications   cephALEXin 500 MG capsule Commonly known as:  KEFLEX     TAKE these medications   acetaminophen 500 MG tablet Commonly known as:  TYLENOL Take 1,000 mg by mouth 2 (two) times daily.   ADVAIR DISKUS 250-50 MCG/DOSE Aepb Generic drug:  Fluticasone-Salmeterol INHALE 1 PUFF BY MOUTH TWICE DAILY   amitriptyline 25 MG tablet Commonly known as:  ELAVIL Take 25 mg by mouth at bedtime What changed:    how much to take  how to take this  when to take this  additional instructions   ATROVENT HFA 17 MCG/ACT inhaler Generic drug:  ipratropium inhale 2 puffs INTO THE LUNGS every 6 hours if needed for wheezing   b complex vitamins tablet Take 1 tablet by mouth daily.   cefdinir 300 MG capsule Commonly known as:   OMNICEF Take 1 capsule (300 mg total) by mouth 2 (two) times daily for 8 days.   CoQ-10 200 MG Caps Take 200 mg by mouth daily.   ezetimibe 10 MG tablet Commonly known as:  ZETIA TAKE 1 TABLET BY MOUTH ONCE DAILY   ferrous sulfate 325 (65 FE) MG tablet Take 1 tablet (325 mg total) by mouth 2 (two) times daily with a meal.   folic acid 1 MG tablet Commonly known as:  FOLVITE Take 1 tablet (1 mg total) by mouth daily.   HAIR/SKIN/NAILS PO Take  1 tablet by mouth daily.   hydrocortisone 2.5 % cream Apply to VULVA at bedtime as needed with miconazole for lichen planus   ketoconazole 2 % cream Commonly known as:  NIZORAL Apply 1 application topically daily as needed (for crack bleeding fingers.).   loratadine 10 MG tablet Commonly known as:  CLARITIN Take 10 mg by mouth daily.   meclizine 25 MG tablet Commonly known as:  ANTIVERT Take 25 mg by mouth 3 (three) times daily as needed for dizziness.   miconazole 2 % cream Commonly known as:  MICOTIN Apply 1 application topically at bedtime as needed (with hydrocortisone for lichen planus).   pantoprazole 40 MG tablet Commonly known as:  PROTONIX Take 1 tablet (40 mg total) by mouth 2 (two) times daily.   polyethylene glycol powder powder Commonly known as:  GLYCOLAX/MIRALAX Take 8.5 g by mouth daily.   spironolactone 25 MG tablet Commonly known as:  ALDACTONE Take 1 tablet (25 mg total) by mouth every morning.   traMADol 50 MG tablet Commonly known as:  ULTRAM Take 1 tablet (50 mg total) by mouth daily as needed for severe pain.   traZODone 150 MG tablet Commonly known as:  DESYREL TAKE 1 AND 2/3 TO 2 TABLETS BY MOUTH AT BEDTIME AS DIRECTED What changed:  See the new instructions.   verapamil 180 MG CR tablet Commonly known as:  CALAN-SR take 1 tablet by mouth twice a day   vitamin B-12 1000 MCG tablet Commonly known as:  CYANOCOBALAMIN Take 1,000 mcg by mouth daily.   vitamin C 500 MG tablet Commonly known  as:  ASCORBIC ACID Take 500 mg by mouth at bedtime.   Vitamin D3 2000 units Tabs Take 2,000 Units by mouth 2 (two) times a week.         Today   CHIEF COMPLAINT:   Patient doing well this morning.   VITAL SIGNS:  Blood pressure 129/84, pulse (!) 114, temperature 98.2 F (36.8 C), temperature source Oral, resp. rate 16, height 5\' 3"  (1.6 m), weight 82.7 kg (182 lb 4.8 oz), SpO2 95 %.   REVIEW OF SYSTEMS:  Review of Systems  Constitutional: Negative.  Negative for chills, fever and malaise/fatigue.  HENT: Negative.  Negative for ear discharge, ear pain, hearing loss, nosebleeds and sore throat.   Eyes: Negative.  Negative for blurred vision and pain.  Respiratory: Negative.  Negative for cough, hemoptysis, shortness of breath and wheezing.   Cardiovascular: Negative.  Negative for chest pain, palpitations and leg swelling.  Gastrointestinal: Negative.  Negative for abdominal pain, blood in stool, diarrhea, nausea and vomiting.  Genitourinary: Negative.  Negative for dysuria.  Musculoskeletal: Negative.  Negative for back pain.  Skin: Negative.   Neurological: Negative for dizziness, tremors, speech change, focal weakness, seizures and headaches.  Endo/Heme/Allergies: Negative.  Does not bruise/bleed easily.  Psychiatric/Behavioral: Negative.  Negative for depression, hallucinations and suicidal ideas.     PHYSICAL EXAMINATION:  GENERAL:  82 y.o.-year-old patient lying in the bed with no acute distress.  NECK:  Supple, no jugular venous distention. No thyroid enlargement, no tenderness.  LUNGS: Normal breath sounds bilaterally, no wheezing, rales,rhonchi  No use of accessory muscles of respiration.  CARDIOVASCULAR: Irregular, irregular normal heart rate. No murmurs, rubs, or gallops.  ABDOMEN: Soft, non-tender, non-distended. Bowel sounds present. No organomegaly or mass.  EXTREMITIES: No pedal edema, cyanosis, or clubbing.  PSYCHIATRIC: The patient is alert and oriented  x 3.  SKIN: No obvious rash, lesion, or ulcer.  DATA REVIEW:   CBC Recent Labs  Lab 03/22/18 0444  WBC 7.8  HGB 10.6*  HCT 31.1*  PLT 137*    Chemistries  Recent Labs  Lab 03/21/18 1849  03/23/18 0622  NA 128*   < > 129*  K 4.1   < > 4.0  CL 95*   < > 103  CO2 23   < > 18*  GLUCOSE 136*   < > 122*  BUN 17   < > 27*  CREATININE 0.95   < > 0.89  CALCIUM 9.3   < > 8.4*  AST 27  --   --   ALT 17  --   --   ALKPHOS 59  --   --   BILITOT 1.4*  --   --    < > = values in this interval not displayed.    Cardiac Enzymes Recent Labs  Lab 03/21/18 1849  TROPONINI <0.03    Microbiology Results  @MICRORSLT48 @  RADIOLOGY:  Dg Chest Port 1 View  Result Date: 03/21/2018 CLINICAL DATA:  Code sepsis. EXAM: PORTABLE CHEST 1 VIEW COMPARISON:  CT chest 03/19/2017. Single-view of the chest 02/17/2017. FINDINGS: Lung volumes are low. Lungs are clear. There is cardiomegaly. The patient is status post CABG. No pneumothorax or pleural effusion. Aortic atherosclerosis noted. IMPRESSION: Cardiomegaly without acute disease. Atherosclerosis. Electronically Signed   By: Inge Rise M.D.   On: 03/21/2018 19:12      Allergies as of 03/23/2018      Reactions   Beta Adrenergic Blockers Shortness Of Breath   DEPRESSION/HYPOTENSION   Brilinta [ticagrelor] Shortness Of Breath   Caused AFIB   Albuterol    rigors   Aspirin Other (See Comments)   Heartburn    Nsaids Other (See Comments)   ULCER HISTORY & CURRENTLY ON BLOOD THINNERS   Penicillins Hives, Other (See Comments)   Has patient had a PCN reaction causing immediate rash, facial/tongue/throat swelling, SOB or lightheadedness with hypotension: No Has patient had a PCN reaction causing severe rash involving mucus membranes or skin necrosis: No Has patient had a PCN reaction that required hospitalization No Has patient had a PCN reaction occurring within the last 10 years: No If all of the above answers are "NO", then may  proceed with Cephalosporin use.   Statins Other (See Comments)   Muscle weakness   Sulfa Antibiotics Rash   At mouth   Sulfasalazine Rash   At mouth   Tetracycline Rash   Tetracyclines & Related Rash      Medication List    STOP taking these medications   cephALEXin 500 MG capsule Commonly known as:  KEFLEX     TAKE these medications   acetaminophen 500 MG tablet Commonly known as:  TYLENOL Take 1,000 mg by mouth 2 (two) times daily.   ADVAIR DISKUS 250-50 MCG/DOSE Aepb Generic drug:  Fluticasone-Salmeterol INHALE 1 PUFF BY MOUTH TWICE DAILY   amitriptyline 25 MG tablet Commonly known as:  ELAVIL Take 25 mg by mouth at bedtime What changed:    how much to take  how to take this  when to take this  additional instructions   ATROVENT HFA 17 MCG/ACT inhaler Generic drug:  ipratropium inhale 2 puffs INTO THE LUNGS every 6 hours if needed for wheezing   b complex vitamins tablet Take 1 tablet by mouth daily.   cefdinir 300 MG capsule Commonly known as:  OMNICEF Take 1 capsule (300 mg total) by mouth 2 (two) times  daily for 8 days.   CoQ-10 200 MG Caps Take 200 mg by mouth daily.   ezetimibe 10 MG tablet Commonly known as:  ZETIA TAKE 1 TABLET BY MOUTH ONCE DAILY   ferrous sulfate 325 (65 FE) MG tablet Take 1 tablet (325 mg total) by mouth 2 (two) times daily with a meal.   folic acid 1 MG tablet Commonly known as:  FOLVITE Take 1 tablet (1 mg total) by mouth daily.   HAIR/SKIN/NAILS PO Take 1 tablet by mouth daily.   hydrocortisone 2.5 % cream Apply to VULVA at bedtime as needed with miconazole for lichen planus   ketoconazole 2 % cream Commonly known as:  NIZORAL Apply 1 application topically daily as needed (for crack bleeding fingers.).   loratadine 10 MG tablet Commonly known as:  CLARITIN Take 10 mg by mouth daily.   meclizine 25 MG tablet Commonly known as:  ANTIVERT Take 25 mg by mouth 3 (three) times daily as needed for dizziness.    miconazole 2 % cream Commonly known as:  MICOTIN Apply 1 application topically at bedtime as needed (with hydrocortisone for lichen planus).   pantoprazole 40 MG tablet Commonly known as:  PROTONIX Take 1 tablet (40 mg total) by mouth 2 (two) times daily.   polyethylene glycol powder powder Commonly known as:  GLYCOLAX/MIRALAX Take 8.5 g by mouth daily.   spironolactone 25 MG tablet Commonly known as:  ALDACTONE Take 1 tablet (25 mg total) by mouth every morning.   traMADol 50 MG tablet Commonly known as:  ULTRAM Take 1 tablet (50 mg total) by mouth daily as needed for severe pain.   traZODone 150 MG tablet Commonly known as:  DESYREL TAKE 1 AND 2/3 TO 2 TABLETS BY MOUTH AT BEDTIME AS DIRECTED What changed:  See the new instructions.   verapamil 180 MG CR tablet Commonly known as:  CALAN-SR take 1 tablet by mouth twice a day   vitamin B-12 1000 MCG tablet Commonly known as:  CYANOCOBALAMIN Take 1,000 mcg by mouth daily.   vitamin C 500 MG tablet Commonly known as:  ASCORBIC ACID Take 500 mg by mouth at bedtime.   Vitamin D3 2000 units Tabs Take 2,000 Units by mouth 2 (two) times a week.          Management plans discussed with the patient and she is in agreement. Stable for discharge home  Patient should follow up with pcp  CODE STATUS:     Code Status Orders  (From admission, onward)        Start     Ordered   03/21/18 2157  Full code  Continuous     03/21/18 2156    Code Status History    Date Active Date Inactive Code Status Order ID Comments User Context   02/16/2018 1039 02/18/2018 1823 DNR [id number]  Vaughan Basta, MD Inpatient   03/12/2017 2113 03/13/2017 1834 Full Code [id number]  Gladstone Lighter, MD Inpatient   10/08/2016 1834 10/10/2016 1526 Full Code [id number]  [name], MD ED   06/07/2016 1022 06/07/2016 1724 Full Code [id number]  Nicholes Mango, MD Inpatient   06/06/2016 2211 06/07/2016 1021 DNR [id number]  Max Sane, MD ED    06/01/2016 0019 06/01/2016 1356 Full Code [id number]  Lance Coon, MD Inpatient    Advance Directive Documentation     Most Recent Value  Type of Advance Directive  Healthcare Power of Attorney, Living will  Pre-existing out of facility DNR order (yellow form or pink  MOST form)  -  "MOST" Form in Place?  -      TOTAL TIME TAKING CARE OF THIS PATIENT: 38 minutes.    Note: This dictation was prepared with Dragon dictation along with smaller phrase technology. Any transcriptional errors that result from this process are unintentional.  Tarry Fountain M.D on 03/23/2018 at 9:05 AM  Between 7am to 6pm - Pager - [phone] After 6pm go to [email or web address] - password [email or web address]  Office  [phone]  CC: Primary care physician; [name], MD

## 2018-03-23 NOTE — Assessment & Plan Note (Signed)
S/p DES - SVG to [facility] 05/2016.  Followed by cardiology.

## 2018-03-24 ENCOUNTER — Telehealth: Payer: Self-pay | Admitting: Internal Medicine

## 2018-03-24 ENCOUNTER — Telehealth: Payer: Self-pay

## 2018-03-24 LAB — CULTURE, BLOOD (ROUTINE X 2)

## 2018-03-24 NOTE — Telephone Encounter (Signed)
Copied from [place] [id number]. Topic: Appointment Scheduling - Scheduling Inquiry for Clinic >> Mar 24, 2018 10:44 AM Synthia Innocent wrote: Reason for CRM: Needing hospital follow up within 4 days, Dr Nicki Reaper, out of office. Please advise

## 2018-03-24 NOTE — Telephone Encounter (Signed)
Dr. Nicki Reaper is out of the office this week.. What should the next step be

## 2018-03-24 NOTE — Telephone Encounter (Signed)
Copied from [place] [id number]. Topic: Appointment Scheduling - Scheduling Inquiry for Clinic >> Mar 24, 2018  6:18 PM Cecelia Byars, NT wrote: Reason for CRM: Patient called to follow up on original call concerning  HFU  and which provider is able to see her as soon as possible please call her at  [phone]

## 2018-03-25 ENCOUNTER — Inpatient Hospital Stay
Admission: EM | Admit: 2018-03-25 | Discharge: 2018-04-01 | DRG: 247 | Disposition: A | Discharge status: Home Health | Payer: Medicare Other | Attending: Internal Medicine | Admitting: Internal Medicine

## 2018-03-25 ENCOUNTER — Other Ambulatory Visit: Payer: Self-pay

## 2018-03-25 ENCOUNTER — Telehealth: Payer: Self-pay

## 2018-03-25 ENCOUNTER — Emergency Department: Payer: Medicare Other

## 2018-03-25 DIAGNOSIS — Z882 Allergy status to sulfonamides status: Secondary | ICD-10-CM

## 2018-03-25 DIAGNOSIS — I25118 Atherosclerotic heart disease of native coronary artery with other forms of angina pectoris: Secondary | ICD-10-CM | POA: Diagnosis present

## 2018-03-25 DIAGNOSIS — I255 Ischemic cardiomyopathy: Secondary | ICD-10-CM | POA: Diagnosis not present

## 2018-03-25 DIAGNOSIS — E871 Hypo-osmolality and hyponatremia: Secondary | ICD-10-CM | POA: Diagnosis present

## 2018-03-25 DIAGNOSIS — R2681 Unsteadiness on feet: Secondary | ICD-10-CM | POA: Diagnosis not present

## 2018-03-25 DIAGNOSIS — I481 Persistent atrial fibrillation: Secondary | ICD-10-CM | POA: Diagnosis not present

## 2018-03-25 DIAGNOSIS — B9620 Unspecified Escherichia coli [E. coli] as the cause of diseases classified elsewhere: Secondary | ICD-10-CM | POA: Diagnosis present

## 2018-03-25 DIAGNOSIS — Z9181 History of falling: Secondary | ICD-10-CM

## 2018-03-25 DIAGNOSIS — R0902 Hypoxemia: Secondary | ICD-10-CM | POA: Diagnosis not present

## 2018-03-25 DIAGNOSIS — D649 Anemia, unspecified: Secondary | ICD-10-CM | POA: Diagnosis not present

## 2018-03-25 DIAGNOSIS — Z951 Presence of aortocoronary bypass graft: Secondary | ICD-10-CM | POA: Diagnosis not present

## 2018-03-25 DIAGNOSIS — I480 Paroxysmal atrial fibrillation: Secondary | ICD-10-CM | POA: Diagnosis not present

## 2018-03-25 DIAGNOSIS — I10 Essential (primary) hypertension: Secondary | ICD-10-CM | POA: Diagnosis present

## 2018-03-25 DIAGNOSIS — E782 Mixed hyperlipidemia: Secondary | ICD-10-CM | POA: Diagnosis not present

## 2018-03-25 DIAGNOSIS — S20219D Contusion of unspecified front wall of thorax, subsequent encounter: Secondary | ICD-10-CM | POA: Diagnosis not present

## 2018-03-25 DIAGNOSIS — I4891 Unspecified atrial fibrillation: Secondary | ICD-10-CM | POA: Diagnosis present

## 2018-03-25 DIAGNOSIS — S2241XD Multiple fractures of ribs, right side, subsequent encounter for fracture with routine healing: Secondary | ICD-10-CM | POA: Diagnosis not present

## 2018-03-25 DIAGNOSIS — Z9049 Acquired absence of other specified parts of digestive tract: Secondary | ICD-10-CM

## 2018-03-25 DIAGNOSIS — Z8249 Family history of ischemic heart disease and other diseases of the circulatory system: Secondary | ICD-10-CM

## 2018-03-25 DIAGNOSIS — M353 Polymyalgia rheumatica: Secondary | ICD-10-CM | POA: Diagnosis present

## 2018-03-25 DIAGNOSIS — F329 Major depressive disorder, single episode, unspecified: Secondary | ICD-10-CM | POA: Diagnosis present

## 2018-03-25 DIAGNOSIS — Z886 Allergy status to analgesic agent status: Secondary | ICD-10-CM

## 2018-03-25 DIAGNOSIS — Q211 Atrial septal defect: Secondary | ICD-10-CM

## 2018-03-25 DIAGNOSIS — Z961 Presence of intraocular lens: Secondary | ICD-10-CM | POA: Diagnosis present

## 2018-03-25 DIAGNOSIS — R0602 Shortness of breath: Secondary | ICD-10-CM | POA: Diagnosis not present

## 2018-03-25 DIAGNOSIS — E559 Vitamin D deficiency, unspecified: Secondary | ICD-10-CM | POA: Diagnosis present

## 2018-03-25 DIAGNOSIS — Z8719 Personal history of other diseases of the digestive system: Secondary | ICD-10-CM

## 2018-03-25 DIAGNOSIS — Z86010 Personal history of colonic polyps: Secondary | ICD-10-CM

## 2018-03-25 DIAGNOSIS — I429 Cardiomyopathy, unspecified: Secondary | ICD-10-CM | POA: Diagnosis not present

## 2018-03-25 DIAGNOSIS — I509 Heart failure, unspecified: Secondary | ICD-10-CM | POA: Diagnosis not present

## 2018-03-25 DIAGNOSIS — G2581 Restless legs syndrome: Secondary | ICD-10-CM | POA: Diagnosis not present

## 2018-03-25 DIAGNOSIS — Z8619 Personal history of other infectious and parasitic diseases: Secondary | ICD-10-CM

## 2018-03-25 DIAGNOSIS — Z66 Do not resuscitate: Secondary | ICD-10-CM | POA: Diagnosis not present

## 2018-03-25 DIAGNOSIS — Z9841 Cataract extraction status, right eye: Secondary | ICD-10-CM

## 2018-03-25 DIAGNOSIS — Z760 Encounter for issue of repeat prescription: Secondary | ICD-10-CM

## 2018-03-25 DIAGNOSIS — I2721 Secondary pulmonary arterial hypertension: Secondary | ICD-10-CM | POA: Diagnosis present

## 2018-03-25 DIAGNOSIS — I700 Atherosclerosis of aorta: Secondary | ICD-10-CM | POA: Diagnosis present

## 2018-03-25 DIAGNOSIS — R7989 Other specified abnormal findings of blood chemistry: Secondary | ICD-10-CM | POA: Diagnosis not present

## 2018-03-25 DIAGNOSIS — Z9089 Acquired absence of other organs: Secondary | ICD-10-CM

## 2018-03-25 DIAGNOSIS — I4819 Other persistent atrial fibrillation: Secondary | ICD-10-CM

## 2018-03-25 DIAGNOSIS — S8002XD Contusion of left knee, subsequent encounter: Secondary | ICD-10-CM | POA: Diagnosis not present

## 2018-03-25 DIAGNOSIS — Z881 Allergy status to other antibiotic agents status: Secondary | ICD-10-CM

## 2018-03-25 DIAGNOSIS — Z888 Allergy status to other drugs, medicaments and biological substances status: Secondary | ICD-10-CM

## 2018-03-25 DIAGNOSIS — N179 Acute kidney failure, unspecified: Secondary | ICD-10-CM | POA: Diagnosis not present

## 2018-03-25 DIAGNOSIS — Z85820 Personal history of malignant melanoma of skin: Secondary | ICD-10-CM

## 2018-03-25 DIAGNOSIS — I5033 Acute on chronic diastolic (congestive) heart failure: Secondary | ICD-10-CM | POA: Diagnosis present

## 2018-03-25 DIAGNOSIS — S23151D Dislocation of T8/T9 thoracic vertebra, subsequent encounter: Secondary | ICD-10-CM | POA: Diagnosis not present

## 2018-03-25 DIAGNOSIS — I25110 Atherosclerotic heart disease of native coronary artery with unstable angina pectoris: Secondary | ICD-10-CM | POA: Diagnosis not present

## 2018-03-25 DIAGNOSIS — Z8349 Family history of other endocrine, nutritional and metabolic diseases: Secondary | ICD-10-CM

## 2018-03-25 DIAGNOSIS — Z801 Family history of malignant neoplasm of trachea, bronchus and lung: Secondary | ICD-10-CM

## 2018-03-25 DIAGNOSIS — S60212D Contusion of left wrist, subsequent encounter: Secondary | ICD-10-CM | POA: Diagnosis not present

## 2018-03-25 DIAGNOSIS — Z96642 Presence of left artificial hip joint: Secondary | ICD-10-CM | POA: Diagnosis present

## 2018-03-25 DIAGNOSIS — I200 Unstable angina: Secondary | ICD-10-CM

## 2018-03-25 DIAGNOSIS — Z862 Personal history of diseases of the blood and blood-forming organs and certain disorders involving the immune mechanism: Secondary | ICD-10-CM

## 2018-03-25 DIAGNOSIS — M25062 Hemarthrosis, left knee: Secondary | ICD-10-CM | POA: Diagnosis not present

## 2018-03-25 DIAGNOSIS — Z87440 Personal history of urinary (tract) infections: Secondary | ICD-10-CM

## 2018-03-25 DIAGNOSIS — Z79899 Other long term (current) drug therapy: Secondary | ICD-10-CM

## 2018-03-25 DIAGNOSIS — Z820 Family history of epilepsy and other diseases of the nervous system: Secondary | ICD-10-CM

## 2018-03-25 DIAGNOSIS — Z96653 Presence of artificial knee joint, bilateral: Secondary | ICD-10-CM | POA: Diagnosis present

## 2018-03-25 DIAGNOSIS — E7849 Other hyperlipidemia: Secondary | ICD-10-CM | POA: Diagnosis not present

## 2018-03-25 DIAGNOSIS — I5043 Acute on chronic combined systolic (congestive) and diastolic (congestive) heart failure: Secondary | ICD-10-CM | POA: Diagnosis not present

## 2018-03-25 DIAGNOSIS — J45909 Unspecified asthma, uncomplicated: Secondary | ICD-10-CM | POA: Diagnosis present

## 2018-03-25 DIAGNOSIS — Z880 Allergy status to penicillin: Secondary | ICD-10-CM

## 2018-03-25 DIAGNOSIS — Z8679 Personal history of other diseases of the circulatory system: Secondary | ICD-10-CM

## 2018-03-25 DIAGNOSIS — I110 Hypertensive heart disease with heart failure: Secondary | ICD-10-CM | POA: Diagnosis not present

## 2018-03-25 DIAGNOSIS — I2510 Atherosclerotic heart disease of native coronary artery without angina pectoris: Secondary | ICD-10-CM | POA: Diagnosis not present

## 2018-03-25 DIAGNOSIS — K219 Gastro-esophageal reflux disease without esophagitis: Secondary | ICD-10-CM | POA: Diagnosis present

## 2018-03-25 DIAGNOSIS — E876 Hypokalemia: Secondary | ICD-10-CM | POA: Diagnosis not present

## 2018-03-25 DIAGNOSIS — J9811 Atelectasis: Secondary | ICD-10-CM | POA: Diagnosis not present

## 2018-03-25 DIAGNOSIS — Z9861 Coronary angioplasty status: Secondary | ICD-10-CM | POA: Diagnosis not present

## 2018-03-25 DIAGNOSIS — Z87891 Personal history of nicotine dependence: Secondary | ICD-10-CM

## 2018-03-25 DIAGNOSIS — Z7951 Long term (current) use of inhaled steroids: Secondary | ICD-10-CM

## 2018-03-25 DIAGNOSIS — I081 Rheumatic disorders of both mitral and tricuspid valves: Secondary | ICD-10-CM | POA: Diagnosis present

## 2018-03-25 DIAGNOSIS — R748 Abnormal levels of other serum enzymes: Secondary | ICD-10-CM | POA: Diagnosis not present

## 2018-03-25 DIAGNOSIS — M549 Dorsalgia, unspecified: Secondary | ICD-10-CM | POA: Diagnosis present

## 2018-03-25 DIAGNOSIS — E785 Hyperlipidemia, unspecified: Secondary | ICD-10-CM | POA: Diagnosis present

## 2018-03-25 DIAGNOSIS — Z741 Need for assistance with personal care: Secondary | ICD-10-CM | POA: Diagnosis not present

## 2018-03-25 DIAGNOSIS — N390 Urinary tract infection, site not specified: Secondary | ICD-10-CM | POA: Diagnosis present

## 2018-03-25 DIAGNOSIS — Z823 Family history of stroke: Secondary | ICD-10-CM

## 2018-03-25 DIAGNOSIS — Z9842 Cataract extraction status, left eye: Secondary | ICD-10-CM

## 2018-03-25 DIAGNOSIS — Z818 Family history of other mental and behavioral disorders: Secondary | ICD-10-CM

## 2018-03-25 DIAGNOSIS — R278 Other lack of coordination: Secondary | ICD-10-CM | POA: Diagnosis not present

## 2018-03-25 DIAGNOSIS — S8001XD Contusion of right knee, subsequent encounter: Secondary | ICD-10-CM | POA: Diagnosis not present

## 2018-03-25 DIAGNOSIS — I340 Nonrheumatic mitral (valve) insufficiency: Secondary | ICD-10-CM | POA: Diagnosis not present

## 2018-03-25 DIAGNOSIS — R293 Abnormal posture: Secondary | ICD-10-CM | POA: Diagnosis not present

## 2018-03-25 DIAGNOSIS — R778 Other specified abnormalities of plasma proteins: Secondary | ICD-10-CM

## 2018-03-25 LAB — BASIC METABOLIC PANEL
ANION GAP: 10 (ref 5–15)
BUN: 17 mg/dL (ref 6–20)
CALCIUM: 9.2 mg/dL (ref 8.9–10.3)
CHLORIDE: 99 mmol/L — AB (ref 101–111)
CO2: 20 mmol/L — AB (ref 22–32)
CREATININE: 0.76 mg/dL (ref 0.44–1.00)
GFR calc Af Amer: >60 mL/min (ref >60)
GFR calc non Af Amer: >60 mL/min (ref >60)
Glucose, Bld: 120 mg/dL — ABNORMAL HIGH (ref 65–99)
Potassium: 4.1 mmol/L (ref 3.5–5.1)
SODIUM: 129 mmol/L — AB (ref 135–145)

## 2018-03-25 LAB — CBC WITH DIFFERENTIAL/PLATELET
BASOS ABS: 0 10*3/uL (ref 0–0.1)
BASOS PCT: 0 %
EOS ABS: 0 10*3/uL (ref 0–0.7)
Eosinophils Relative: 1 %
HEMATOCRIT: 32.1 % — AB (ref 35.0–47.0)
HEMOGLOBIN: 10.9 g/dL — AB (ref 12.0–16.0)
Lymphocytes Relative: 13 %
Lymphs Abs: 0.6 10*3/uL — ABNORMAL LOW (ref 1.0–3.6)
MCH: 33.7 pg (ref 26.0–34.0)
MCHC: 34.1 g/dL (ref 32.0–36.0)
MCV: 98.9 fL (ref 80.0–100.0)
Monocytes Absolute: 0.7 10*3/uL (ref 0.2–0.9)
Monocytes Relative: 15 %
NEUTROS ABS: 3.5 10*3/uL (ref 1.4–6.5)
NEUTROS PCT: 71 %
Platelets: 238 10*3/uL (ref 150–440)
RBC: 3.25 MIL/uL — ABNORMAL LOW (ref 3.80–5.20)
RDW: 17.2 % — AB (ref 11.5–14.5)
WBC: 4.9 10*3/uL (ref 3.6–11.0)

## 2018-03-25 LAB — TROPONIN I: TROPONIN I: 0.08 ng/mL — AB (ref <0.03)

## 2018-03-25 LAB — BRAIN NATRIURETIC PEPTIDE: B Natriuretic Peptide: 1912 pg/mL — ABNORMAL HIGH (ref 0.0–100.0)

## 2018-03-25 MED ORDER — FUROSEMIDE 10 MG/ML IJ SOLN
40.0000 mg | Freq: Once | INTRAMUSCULAR | Status: AC
  Administered 2018-03-25: 40 mg via INTRAVENOUS
  Filled 2018-03-25: qty 4
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
## 2018-03-25 NOTE — ED Triage Notes (Signed)
Pt from home by [facility] with reports by pt of "I'm in failure." Pt states she has had swelling to BLE as well as increased SHOB when ambulating. Pt states she was here recently with urosepsis. Pt A&O at this time. Pt states hx of CHF, afib and COPD.

## 2018-03-25 NOTE — Telephone Encounter (Signed)
Scheduled with Dr. Derrel Nip on Thursday.

## 2018-03-25 NOTE — Telephone Encounter (Signed)
Transition Care Management Follow-up Telephone Call   Date discharged? 03/23/18   How have you been since you were released from the hospital? SOMEWHAT BETTER. TIRED. 2+ PITTING EDEMA BOTH FEET.  DRINKING FLUIDS IN MODERATION. SOB WITH EXERTION, SELF PACING SLOW WITH MOVEMENT. SLEEPING 2-3 HOURS AT A TIME THROUGH THE NIGHT. SMALL BITES WHEN EATING TO AVOID ASPIRATING. VOIDING/BM WITHOUT ISSUES.  NO BLEEDING.  NO CHEST PAIN.    Do you understand why you were in the hospital? YES, UTI WITH E.COLI SEPSIS.   Do you understand the discharge instructions? YES, FOLLOW UP WITH PCP.    Where were you discharged to? HOME.    Items Reviewed:  Medications reviewed: YES, STOP CEPHALEXIN 500MG . HOLDING ELIQUIS AND FISH OIL. TAKE ALL SCHEDULED MEDICATIONS DIRECTED. TAKING OMNICEF BID AS DIRECTED.  Allergies reviewed: YES.  Dietary changes reviewed: HEART HEALTHY/LOW SODIUM DIET.   Referrals reviewed: YES. NONE FURTHER AT THIS TIME.    Functional Questionnaire:   Activities of Daily Living (ADLs):   She states they are independent in the following: INDEPENDENT IN ALL ADLS.  SELF PACE.  States they require assistance with the following: USES WALKER WHEN AMBULATING.     Any transportation issues/concerns?: NO.  DRIVING ARRANGEMENTS MADE WITH [facility].    Any patient concerns? NONE ADDITIONAL.    Confirmed importance and date/time of follow-up visits scheduled YES, APPOINTMENT SCHEDULED 03/27/18 AT 11:00.   Provider Appointment booked with DR. Derrel Nip.   Confirmed with patient if condition begins to worsen call PCP or go to the ER.  Patient was given the office number and encouraged to call back with question or concerns.  :YES.

## 2018-03-25 NOTE — Telephone Encounter (Signed)
Please advise 

## 2018-03-25 NOTE — H&P (Signed)
[facility] NAME: Charlotte Wagner    MR#:  [id number]  DATE OF BIRTH:  10/05/1936  DATE OF ADMISSION:  03/25/2018  PRIMARY CARE PHYSICIAN: [name], MD   REQUESTING/REFERRING PHYSICIAN: Cherylann Banas, MD  CHIEF COMPLAINT:   Chief Complaint  Patient presents with  . Shortness of Breath    HISTORY OF PRESENT ILLNESS:  Charlotte Wagner  is a 82 y.o. female who presents recently admitted here for UTI with hyponatremia.  She was discharged home and soon thereafter began feeling worse again with some dyspnea and lower extremity edema.  She returns today with several days of progression of the symptoms.  She came in because she started to become hypoxic when trying to move around her home.  Here in the ED her work-up is consistent with acute on chronic heart failure.  She was given diuresis in the ED and hospitalist were called for admission  PAST MEDICAL HISTORY:   Past Medical History:  Diagnosis Date  . Allergy   . Arthritis    s/p bilateral knees and left hip replacement  . Asthma   . CAD (coronary artery disease)    a. 12/1996 s/p CABG x4 ([place]);  b. 2005 Pt reports stress test & cath, which revealed patent grafts.  . Cancer ([place])    melanoma right arm  . Carotid arterial disease ([place])    a. 04/2015 Carotid U/S: <50% bilat ICA stenosis.  . Chronic kidney disease   . Colon polyps    H/O  . Depression   . GERD (gastroesophageal reflux disease)    h/o hiatal hernia  . Headache    migraines in past  . Heart murmur    a. 04/2011 Echo: EF 55-60%, bilat atrial enlargement, mild to mod TR.  [name] History of chicken pox   . History of hiatal hernia   . Hx of migraines    rare now  . Hx: UTI (urinary tract infection)   . Hyperlipidemia    a. Statin intolerant -->on zetia.  . Hypertension   . Hypertensive heart disease   . Lichen planus   . Melanoma ([place])   . Palpitations    a. rare PVC's and h/o SVT.  [name] PMR  (polymyalgia rheumatica) (HCC)    h/o in setting of crestor usage.  [name] PSVT (paroxysmal supraventricular tachycardia) ([place])   . PUD (peptic ulcer disease)    remote history  . Raynaud's phenomenon   . Spinal stenosis   . Urine incontinence    H/O  . Vitamin D deficiency      PAST SURGICAL HISTORY:   Past Surgical History:  Procedure Laterality Date  . ADENOIDECTOMY     age 40  . BACK SURGERY     L3-L5  . BILATERAL CARPAL TUNNEL RELEASE    . BREAST BIOPSY Right    bx x 3 neg  . BREAST SURGERY Right    biopsy x 3 (all benign)  . CARDIAC CATHETERIZATION N/A 06/01/2016   Procedure: LEFT HEART CATH AND CORS/GRAFTS ANGIOGRAPHY;  Surgeon: Wellington Hampshire, MD;  Location: [facility];  Service: Cardiovascular;  Laterality: N/A;  . CARDIAC CATHETERIZATION N/A 06/01/2016   Procedure: Coronary Stent Intervention;  Surgeon: Wellington Hampshire, MD;  Location: [facility];  Service: Cardiovascular;  Laterality: N/A;  . CARDIOVERSION N/A 03/08/2017   Procedure: CARDIOVERSION;  Surgeon: Wellington Hampshire, MD;  Location: [facility];  Service: Cardiovascular;  Laterality: N/A;  .  CATARACT EXTRACTION W/PHACO Right 01/04/2016   Procedure: CATARACT EXTRACTION PHACO AND INTRAOCULAR LENS PLACEMENT (IOC);  Surgeon: Leandrew Koyanagi, MD;  Location: [facility];  Service: Ophthalmology;  Laterality: Right;  MALYUGIN  . CATARACT EXTRACTION W/PHACO Left 01/25/2016   Procedure: CATARACT EXTRACTION PHACO AND INTRAOCULAR LENS PLACEMENT ([place]) left eye;  Surgeon: Leandrew Koyanagi, MD;  Location: [facility];  Service: Ophthalmology;  Laterality: Left;  MALYUGIN SHUGARCAINE  . CHOLECYSTECTOMY  90's  . COLONOSCOPY WITH PROPOFOL N/A 05/03/2015   Procedure: COLONOSCOPY WITH PROPOFOL;  Surgeon: Lollie Sails, MD;  Location: [facility];  Service: Endoscopy;  Laterality: N/A;  . CORONARY ARTERY BYPASS GRAFT  98   4 vessel  . ESOPHAGOGASTRODUODENOSCOPY N/A 03/22/2015   Procedure:  ESOPHAGOGASTRODUODENOSCOPY (EGD);  Surgeon: Lollie Sails, MD;  Location: [facility] ENDOSCOPY;  Service: Endoscopy;  Laterality: N/A;  . HEMORRHOID BANDING    . HEMORRHOID SURGERY N/A 02/17/2018   Procedure: HEMORRHOIDECTOMY;  Surgeon: Robert Bellow, MD;  Location: [facility];  Service: General;  Laterality: N/A;  . JOINT REPLACEMENT     BILATERAL KNEE REPLACEMENTS  . KNEE ARTHROSCOPY W/ OATS PROCEDURE     Lt knee (9/01), Rt knee (3/11), Lt hip (5/10)  . TOTAL HIP ARTHROPLASTY       SOCIAL HISTORY:   Social History   Tobacco Use  . Smoking status: Former Research scientist (life sciences)  . Smokeless tobacco: Never Used  . Tobacco comment: quit 44+ yrs ago  Substance Use Topics  . Alcohol use: No    Alcohol/week: 0.0 oz     FAMILY HISTORY:   Family History  Problem Relation Age of Onset  . Thyroid disease Mother        graves disease  . Heart disease Father        rheumatic heart  . Alcohol abuse Father   . Depression Father   . Lung cancer Sister   . Depression Daughter   . Thyroid disease Daughter        hashimoto  . Depression Son   . Stroke Maternal Grandmother   . Depression Maternal Grandmother   . Hypertension Maternal Grandfather   . Hypertension Paternal Grandfather   . Seizures Son   . Breast cancer Neg Hx      DRUG ALLERGIES:   Allergies  Allergen Reactions  . Beta Adrenergic Blockers Shortness Of Breath    DEPRESSION/HYPOTENSION  . Brilinta [Ticagrelor] Shortness Of Breath    Caused AFIB  . Albuterol     rigors  . Aspirin Other (See Comments)    Heartburn   . Nsaids Other (See Comments)    ULCER HISTORY & CURRENTLY ON BLOOD THINNERS  . Penicillins Hives and Other (See Comments)    Has patient had a PCN reaction causing immediate rash, facial/tongue/throat swelling, SOB or lightheadedness with hypotension: No Has patient had a PCN reaction causing severe rash involving mucus membranes or skin necrosis: No Has patient had a PCN reaction that required hospitalization  No Has patient had a PCN reaction occurring within the last 10 years: No If all of the above answers are "NO", then may proceed with Cephalosporin use.   . Statins Other (See Comments)    Muscle weakness  . Sulfa Antibiotics Rash    At mouth  . Sulfasalazine Rash    At mouth  . Tetracycline Rash  . Tetracyclines & Related Rash    MEDICATIONS AT HOME:   Prior to Admission medications   Medication Sig Start Date End Date Taking?  Authorizing Provider  acetaminophen (TYLENOL) 500 MG tablet Take 1,000 mg by mouth 2 (two) times daily.    Yes [provider]  ADVAIR DISKUS 250-50 MCG/DOSE AEPB INHALE 1 PUFF BY MOUTH TWICE DAILY 02/25/18  Yes [name], MD  amitriptyline (ELAVIL) 25 MG tablet Take 25 mg by mouth at bedtime 03/23/18  Yes Bettey Costa, MD  ATROVENT HFA 17 MCG/ACT inhaler inhale 2 puffs INTO THE LUNGS every 6 hours if needed for wheezing 04/25/17  Yes [name], MD  b complex vitamins tablet Take 1 tablet by mouth daily.   Yes [provider]  cefdinir (OMNICEF) 300 MG capsule Take 1 capsule (300 mg total) by mouth 2 (two) times daily for 8 days. 03/23/18 03/31/18 Yes Bettey Costa, MD  Cholecalciferol (VITAMIN D3) 2000 UNITS TABS Take 2,000 Units by mouth 2 (two) times a week.    Yes [provider]  Coenzyme Q10 (COQ-10) 200 MG CAPS Take 200 mg by mouth daily.    Yes [provider]  ezetimibe (ZETIA) 10 MG tablet TAKE 1 TABLET BY MOUTH ONCE DAILY 01/29/18  Yes [name], MD  ferrous sulfate 325 (65 FE) MG tablet Take 1 tablet (325 mg total) by mouth 2 (two) times daily with a meal. 02/18/18  Yes Vaughan Basta, MD  folic acid (FOLVITE) 1 MG tablet Take 1 tablet (1 mg total) by mouth daily. 01/13/18  Yes [name], MD  hydrocortisone 2.5 % cream Apply to VULVA at bedtime as needed with miconazole for lichen planus 0/86/57  Yes Mody, Ulice Bold, MD  ketoconazole (NIZORAL) 2 % cream Apply 1 application topically daily as needed  (for crack bleeding fingers.). 03/23/18  Yes Mody, Ulice Bold, MD  loratadine (CLARITIN) 10 MG tablet Take 10 mg by mouth daily.   Yes [provider]  meclizine (ANTIVERT) 25 MG tablet Take 25 mg by mouth 3 (three) times daily as needed for dizziness.   Yes [provider]  miconazole (MICOTIN) 2 % cream Apply 1 application topically at bedtime as needed (with hydrocortisone for lichen planus).    Yes [provider]  Multiple Vitamins-Minerals (HAIR/SKIN/NAILS PO) Take 1 tablet by mouth daily.   Yes [provider]  pantoprazole (PROTONIX) 40 MG tablet Take 1 tablet (40 mg total) by mouth 2 (two) times daily. 02/07/18  Yes [name], MD  polyethylene glycol powder (GLYCOLAX/MIRALAX) powder Take 8.5 g by mouth daily.    Yes [provider]  spironolactone (ALDACTONE) 25 MG tablet Take 1 tablet (25 mg total) by mouth every morning. 02/07/18  Yes [name], MD  traMADol (ULTRAM) 50 MG tablet Take 1 tablet (50 mg total) by mouth daily as needed for severe pain. 03/23/18  Yes Bettey Costa, MD  traZODone (DESYREL) 150 MG tablet TAKE 1 AND 2/3 TO 2 TABLETS BY MOUTH AT BEDTIME AS DIRECTED Patient taking differently: Take 250 mg by mouth at bedtime (1 and 2/3 tablets) 01/20/18  Yes [name], MD  verapamil (CALAN-SR) 180 MG CR tablet take 1 tablet by mouth twice a day 10/31/17  Yes [name], MD  vitamin B-12 (CYANOCOBALAMIN) 1000 MCG tablet Take 1,000 mcg by mouth daily.   Yes [provider]  vitamin C (ASCORBIC ACID) 500 MG tablet Take 500 mg by mouth at bedtime.   Yes [provider]    REVIEW OF SYSTEMS:  Review of Systems  Constitutional: Negative for chills, fever, malaise/fatigue and weight loss.  HENT: Negative for ear pain, hearing loss and tinnitus.  Eyes: Negative for blurred vision, double vision, pain and redness.  Respiratory: Positive for shortness of breath. Negative for cough and hemoptysis.   Cardiovascular:  Positive for orthopnea and leg swelling. Negative for chest pain and palpitations.  Gastrointestinal: Negative for abdominal pain, constipation, diarrhea, nausea and vomiting.  Genitourinary: Negative for dysuria, frequency and hematuria.  Musculoskeletal: Negative for back pain, joint pain and neck pain.  Skin:       No acne, rash, or lesions  Neurological: Negative for dizziness, tremors, focal weakness and weakness.  Endo/Heme/Allergies: Negative for polydipsia. Does not bruise/bleed easily.  Psychiatric/Behavioral: Negative for depression. The patient is not nervous/anxious and does not have insomnia.      VITAL SIGNS:   Vitals:   03/25/18 2028 03/25/18 2130 03/25/18 2200 03/25/18 2230  BP:  (!) 124/100 125/71 124/88  Pulse:  95 91 95  Resp:   (!) 21 (!) 22  Temp:      TempSrc:      SpO2:  96% 96% 97%  Weight: 87.1 kg (192 lb)     Height: 5\' 3"  (1.6 m)      Wt Readings from Last 3 Encounters:  03/25/18 87.1 kg (192 lb)  03/21/18 82.7 kg (182 lb 4.8 oz)  03/18/18 81.7 kg (180 lb 1.9 oz)    PHYSICAL EXAMINATION:  Physical Exam  Vitals reviewed. Constitutional: She is oriented to person, place, and time. She appears well-developed and well-nourished. No distress.  HENT:  Head: Normocephalic and atraumatic.  Mouth/Throat: Oropharynx is clear and moist.  Eyes: Pupils are equal, round, and reactive to light. Conjunctivae and EOM are normal. No scleral icterus.  Neck: Normal range of motion. Neck supple. No JVD present. No thyromegaly present.  Cardiovascular: Normal rate, regular rhythm and intact distal pulses. Exam reveals no gallop and no friction rub.  No murmur heard. Respiratory: She is in respiratory distress (Mild). She has no wheezes. She has rales.  GI: Soft. Bowel sounds are normal. She exhibits no distension. There is no tenderness.  Musculoskeletal: Normal range of motion. She exhibits edema.  No arthritis, no gout  Lymphadenopathy:    She has no cervical  adenopathy.  Neurological: She is alert and oriented to person, place, and time. No cranial nerve deficit.  No dysarthria, no aphasia  Skin: Skin is warm and dry. No rash noted. No erythema.  Psychiatric: She has a normal mood and affect. Her behavior is normal. Judgment and thought content normal.    LABORATORY PANEL:   CBC Recent Labs  Lab 03/25/18 2034  WBC 4.9  HGB 10.9*  HCT 32.1*  PLT 238   ------------------------------------------------------------------------------------------------------------------  Chemistries  Recent Labs  Lab 03/21/18 1849  03/25/18 2034  NA 128*   < > 129*  K 4.1   < > 4.1  CL 95*   < > 99*  CO2 23   < > 20*  GLUCOSE 136*   < > 120*  BUN 17   < > 17  CREATININE 0.95   < > 0.76  CALCIUM 9.3   < > 9.2  AST 27  --   --   ALT 17  --   --   ALKPHOS 59  --   --   BILITOT 1.4*  --   --    < > = values in this interval not displayed.   ------------------------------------------------------------------------------------------------------------------  Cardiac Enzymes Recent Labs  Lab 03/25/18 2034  TROPONINI 0.08*   ------------------------------------------------------------------------------------------------------------------  RADIOLOGY:  Dg Chest 2 View  Result Date: 03/25/2018 CLINICAL DATA:  Swelling of the lower extremities with increasing dyspnea. EXAM: CHEST - 2 VIEW COMPARISON:  03/21/2018 FINDINGS: Stable cardiomegaly with post CABG change. Atherosclerosis of the thoracic arch without aneurysmal dilatation. Left basilar atelectasis is noted. No overt pulmonary edema, effusion or pneumothorax. No pulmonary consolidations. Axillary loose body noted of the left glenohumeral joint. Chronic mild degenerative change of the thoracic spine. Spinal fixation rods are seen of the included upper lumbar spine. IMPRESSION: Stable cardiomegaly with aortic atherosclerosis. Left basilar atelectasis. Electronically Signed   By: Ashley Royalty M.D.   On:  03/25/2018 21:27    EKG:   Orders placed or performed during the hospital encounter of 03/25/18  . EKG 12-Lead  . EKG 12-Lead  . EKG 12-Lead  . EKG 12-Lead  . ED EKG  . ED EKG    IMPRESSION AND PLAN:  Principal Problem:   Acute on chronic diastolic CHF (congestive heart failure) (HCC) -diuresis given in the ED with good effect so far, patient symptoms have improved some.  We will continue diuresis, continue home meds, get a cardiology consult the repeat echocardiogram tomorrow.  We will also trend the patient's cardiac enzymes tonight as her troponin was mildly elevated. Active Problems:   CAD (coronary artery disease) -continue home meds, work-up as above   Essential hypertension, benign -continue home medications   Persistent atrial fibrillation (HCC) -continue home rate controlling meds   GERD (gastroesophageal reflux disease) -home dose PPI   Hyperlipidemia -home dose antilipid  Chart review performed and case discussed with ED provider. Labs, imaging and/or ECG reviewed by provider and discussed with patient/family. Management plans discussed with the patient and/or family.  DVT PROPHYLAXIS: SubQ lovenox  GI PROPHYLAXIS: PPI  ADMISSION STATUS: Inpatient  CODE STATUS: DNR Code Status History    Date Active Date Inactive Code Status Order ID Comments User Context   03/23/2018 0912 03/23/2018 1801 DNR [id number]  Bettey Costa, MD Inpatient   03/21/2018 2157 03/23/2018 0912 Full Code [id number]  Gladstone Lighter, MD Inpatient   02/16/2018 1039 02/18/2018 1823 DNR [id number]  Vaughan Basta, MD Inpatient   03/12/2017 2113 03/13/2017 1834 Full Code [id number]  Gladstone Lighter, MD Inpatient   10/08/2016 1834 10/10/2016 1526 Full Code [id number]  [name], MD ED   06/07/2016 1022 06/07/2016 1724 Full Code [id number]  Nicholes Mango, MD Inpatient   06/06/2016 2211 06/07/2016 1021 DNR [id number]  Max Sane, MD ED   06/01/2016 0019 06/01/2016 1356 Full Code [id number]  Lance Coon, MD  Inpatient    Questions for Most Recent Historical Code Status (Order [id number])    Question Answer Comment   In the event of cardiac or respiratory ARREST Do not call a "code blue"    In the event of cardiac or respiratory ARREST Do not perform Intubation, CPR, defibrillation or ACLS    In the event of cardiac or respiratory ARREST Use medication by any route, position, wound care, and other measures to relive pain and suffering. May use oxygen, suction and manual treatment of airway obstruction as needed for comfort.         Advance Directive Documentation     Most Recent Value  Type of Advance Directive  Healthcare Power of Attorney, Living will, Out of facility DNR (pink MOST or yellow form)  Pre-existing out of facility DNR order (yellow form or pink MOST form)  Yellow form placed in chart (order not valid for inpatient use)  "MOST" Form in Place?  -  TOTAL TIME TAKING CARE OF THIS PATIENT: 45 minutes.   Ruford Dudzinski [place] 03/25/2018, 11:58 PM  [facility]  Office  [phone]  CC: Primary care physician; [name], MD  Note:  This document was prepared using Dragon voice recognition software and may include unintentional dictation errors.

## 2018-03-26 ENCOUNTER — Inpatient Hospital Stay (HOSPITAL_COMMUNITY)
Admit: 2018-03-26 | Discharge: 2018-03-26 | Disposition: A | Discharge status: Home / Self Care | Payer: Medicare Other | Attending: Internal Medicine | Admitting: Internal Medicine

## 2018-03-26 DIAGNOSIS — I340 Nonrheumatic mitral (valve) insufficiency: Secondary | ICD-10-CM

## 2018-03-26 DIAGNOSIS — D649 Anemia, unspecified: Secondary | ICD-10-CM

## 2018-03-26 DIAGNOSIS — I10 Essential (primary) hypertension: Secondary | ICD-10-CM

## 2018-03-26 DIAGNOSIS — E7849 Other hyperlipidemia: Secondary | ICD-10-CM

## 2018-03-26 DIAGNOSIS — I481 Persistent atrial fibrillation: Secondary | ICD-10-CM

## 2018-03-26 DIAGNOSIS — I5033 Acute on chronic diastolic (congestive) heart failure: Secondary | ICD-10-CM

## 2018-03-26 DIAGNOSIS — I25118 Atherosclerotic heart disease of native coronary artery with other forms of angina pectoris: Secondary | ICD-10-CM

## 2018-03-26 DIAGNOSIS — R748 Abnormal levels of other serum enzymes: Secondary | ICD-10-CM

## 2018-03-26 LAB — BASIC METABOLIC PANEL
ANION GAP: 7 (ref 5–15)
BUN: 19 mg/dL (ref 6–20)
CALCIUM: 8.7 mg/dL — AB (ref 8.9–10.3)
CO2: 23 mmol/L (ref 22–32)
CREATININE: 0.84 mg/dL (ref 0.44–1.00)
Chloride: 101 mmol/L (ref 101–111)
GFR calc Af Amer: >60 mL/min (ref >60)
GLUCOSE: 109 mg/dL — AB (ref 65–99)
Potassium: 4 mmol/L (ref 3.5–5.1)
Sodium: 131 mmol/L — ABNORMAL LOW (ref 135–145)

## 2018-03-26 LAB — URINALYSIS, COMPLETE (UACMP) WITH MICROSCOPIC
Bilirubin Urine: NEGATIVE
Glucose, UA: NEGATIVE mg/dL
Hgb urine dipstick: NEGATIVE
Ketones, ur: NEGATIVE mg/dL
Leukocytes, UA: NEGATIVE
Nitrite: NEGATIVE
PROTEIN: NEGATIVE mg/dL
SPECIFIC GRAVITY, URINE: 1.003 — AB (ref 1.005–1.030)
SQUAMOUS EPITHELIAL / LPF: NONE SEEN (ref 0–5)
pH: 6 (ref 5.0–8.0)

## 2018-03-26 LAB — TROPONIN I
TROPONIN I: 0.07 ng/mL — AB (ref <0.03)
TROPONIN I: 0.07 ng/mL — AB (ref <0.03)
Troponin I: 0.09 ng/mL (ref <0.03)

## 2018-03-26 LAB — MRSA PCR SCREENING: MRSA by PCR: NEGATIVE

## 2018-03-26 LAB — CULTURE, BLOOD (ROUTINE X 2)
Culture: NO GROWTH
SPECIAL REQUESTS: ADEQUATE

## 2018-03-26 LAB — CBC
HCT: 29.8 % — ABNORMAL LOW (ref 35.0–47.0)
Hemoglobin: 10.2 g/dL — ABNORMAL LOW (ref 12.0–16.0)
MCH: 33.5 pg (ref 26.0–34.0)
MCHC: 34.3 g/dL (ref 32.0–36.0)
MCV: 97.5 fL (ref 80.0–100.0)
PLATELETS: 238 10*3/uL (ref 150–440)
RBC: 3.05 MIL/uL — ABNORMAL LOW (ref 3.80–5.20)
RDW: 16.7 % — AB (ref 11.5–14.5)
WBC: 4.7 10*3/uL (ref 3.6–11.0)

## 2018-03-26 LAB — ECHOCARDIOGRAM COMPLETE
HEIGHTINCHES: 63 in
WEIGHTICAEL: 3008 [oz_av]

## 2018-03-26 MED ORDER — SPIRONOLACTONE 25 MG PO TABS
25.0000 mg | ORAL_TABLET | Freq: Every morning | ORAL | Status: DC
Start: 2018-03-26 — End: 2018-04-01
  Administered 2018-03-26 – 2018-04-01 (×7): 25 mg via ORAL
  Filled 2018-03-26 (×8): qty 1

## 2018-03-26 MED ORDER — FERROUS SULFATE 325 (65 FE) MG PO TABS
325.0000 mg | ORAL_TABLET | Freq: Two times a day (BID) | ORAL | Status: DC
  Administered 2018-03-26 – 2018-04-01 (×11): 325 mg via ORAL
  Filled 2018-03-26 (×11): qty 1

## 2018-03-26 MED ORDER — ONDANSETRON HCL 4 MG/2ML IJ SOLN
4.0000 mg | Freq: Four times a day (QID) | INTRAMUSCULAR | Status: DC | PRN
Start: 2018-03-26 — End: 2018-04-01

## 2018-03-26 MED ORDER — FUROSEMIDE 10 MG/ML IJ SOLN
20.0000 mg | Freq: Every day | INTRAMUSCULAR | Status: DC
  Administered 2018-03-26: 20 mg via INTRAVENOUS
  Filled 2018-03-26: qty 2

## 2018-03-26 MED ORDER — TRAZODONE HCL 50 MG PO TABS
250.0000 mg | ORAL_TABLET | Freq: Every day | ORAL | Status: DC
  Administered 2018-03-26 – 2018-03-31 (×7): 250 mg via ORAL
  Filled 2018-03-26 (×8): qty 1

## 2018-03-26 MED ORDER — VERAPAMIL HCL ER 180 MG PO TBCR
180.0000 mg | EXTENDED_RELEASE_TABLET | Freq: Two times a day (BID) | ORAL | Status: DC
  Administered 2018-03-26 – 2018-04-01 (×13): 180 mg via ORAL
  Filled 2018-03-26 (×14): qty 1

## 2018-03-26 MED ORDER — EZETIMIBE 10 MG PO TABS
10.0000 mg | ORAL_TABLET | Freq: Every day | ORAL | Status: DC
  Administered 2018-03-26 – 2018-04-01 (×7): 10 mg via ORAL
  Filled 2018-03-26 (×9): qty 1

## 2018-03-26 MED ORDER — SODIUM CHLORIDE 0.9% FLUSH
3.0000 mL | Freq: Two times a day (BID) | INTRAVENOUS | Status: DC
  Administered 2018-03-26 – 2018-03-31 (×6): 3 mL via INTRAVENOUS

## 2018-03-26 MED ORDER — FUROSEMIDE 10 MG/ML IJ SOLN
40.0000 mg | Freq: Two times a day (BID) | INTRAMUSCULAR | Status: DC
  Administered 2018-03-26 – 2018-03-27 (×3): 40 mg via INTRAVENOUS
  Filled 2018-03-26 (×3): qty 4

## 2018-03-26 MED ORDER — ENOXAPARIN SODIUM 40 MG/0.4ML ~~LOC~~ SOLN
40.0000 mg | SUBCUTANEOUS | Status: DC
  Filled 2018-03-26: qty 0.4

## 2018-03-26 MED ORDER — ACETAMINOPHEN 650 MG RE SUPP: 650.0000 mg | Freq: Four times a day (QID) | RECTAL | Status: DC | PRN

## 2018-03-26 MED ORDER — ONDANSETRON HCL 4 MG PO TABS: 4.0000 mg | ORAL_TABLET | Freq: Four times a day (QID) | ORAL | Status: DC | PRN

## 2018-03-26 MED ORDER — CEFDINIR 300 MG PO CAPS
300.0000 mg | ORAL_CAPSULE | Freq: Two times a day (BID) | ORAL | Status: DC
  Administered 2018-03-26 – 2018-04-01 (×13): 300 mg via ORAL
  Filled 2018-03-26 (×15): qty 1

## 2018-03-26 MED ORDER — ACETAMINOPHEN 325 MG PO TABS
650.0000 mg | ORAL_TABLET | Freq: Four times a day (QID) | ORAL | Status: DC | PRN
  Administered 2018-03-28: 650 mg via ORAL
  Filled 2018-03-26: qty 2

## 2018-03-26 MED ORDER — MOMETASONE FURO-FORMOTEROL FUM 200-5 MCG/ACT IN AERO
2.0000 | INHALATION_SPRAY | Freq: Two times a day (BID) | RESPIRATORY_TRACT | Status: DC
  Administered 2018-03-26 – 2018-04-01 (×13): 2 via RESPIRATORY_TRACT
  Filled 2018-03-26: qty 8.8

## 2018-03-26 MED ORDER — APIXABAN 5 MG PO TABS
5.0000 mg | ORAL_TABLET | Freq: Two times a day (BID) | ORAL | Status: DC
  Administered 2018-03-26 – 2018-03-27 (×4): 5 mg via ORAL
  Filled 2018-03-26 (×4): qty 1

## 2018-03-26 MED ORDER — PANTOPRAZOLE SODIUM 40 MG PO TBEC
40.0000 mg | DELAYED_RELEASE_TABLET | Freq: Two times a day (BID) | ORAL | Status: DC
  Administered 2018-03-26 – 2018-04-01 (×14): 40 mg via ORAL
  Filled 2018-03-26 (×14): qty 1

## 2018-03-26 MED ORDER — AMITRIPTYLINE HCL 25 MG PO TABS
25.0000 mg | ORAL_TABLET | Freq: Every day | ORAL | Status: DC
  Administered 2018-03-26 – 2018-03-31 (×7): 25 mg via ORAL
  Filled 2018-03-26 (×7): qty 1

## 2018-03-26 NOTE — Consult Note (Signed)
Cardiology Consult    Patient ID: Charlotte Wagner, Dr. MRN: [id number], DOB/AGE: March 28, 1936   Admit date: 03/25/2018 Date of Consult: 03/26/2018  Primary Physician: [name], MD Primary Cardiologist: No primary care provider on file. Requesting Provider: Dr. 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 female with a history of Afib (previously on eliquis), Chronic diastolic Heart Failure EF 60-65%, CABG x4 in 1998, Cath with stent 05/2016, nuclear study 08/2016 with no significant ischemia, hyperlipidemia, HTN, and GI bleed 02/16/2018 who is being seen today for the evaluation of SOB and bilateral leg edema x 2 days at the request of Dr. Tressia Miners.  Past Medical History   Past Medical History:  Diagnosis Date  . Allergy   . Arthritis    s/p bilateral knees and left hip replacement  . Asthma   . CAD (coronary artery disease)    a. 12/1996 s/p CABG x4 ([place]);  b. 2005 Pt reports stress test & cath, which revealed patent grafts.  . Cancer ([place])    melanoma right arm  . Carotid arterial disease ([place])    a. 04/2015 Carotid U/S: <50% bilat ICA stenosis.  . Chronic kidney disease   . Colon polyps    H/O  . Depression   . GERD (gastroesophageal reflux disease)    h/o hiatal hernia  . Headache    migraines in past  . Heart murmur    a. 04/2011 Echo: EF 55-60%, bilat atrial enlargement, mild to mod TR.  [name] History of chicken pox   . History of hiatal hernia   . Hx of migraines    rare now  . Hx: UTI (urinary tract infection)   . Hyperlipidemia    a. Statin intolerant -->on zetia.  . Hypertension   . Hypertensive heart disease   . Lichen planus   . Melanoma ([place])   . Palpitations    a. rare PVC's and h/o SVT.  [name] PMR (polymyalgia rheumatica) (H[facility])    h/o in setting of crestor usage.  [name] PSVT (paroxysmal supraventricular tachycardia) ([place])   . PUD (peptic ulcer disease)    remote history  .  Raynaud's phenomenon   . Spinal stenosis   . Urine incontinence    H/O  . Vitamin D deficiency     Past Surgical History:  Procedure Laterality Date  . ADENOIDECTOMY     age 65  . BACK SURGERY     L3-L5  . BILATERAL CARPAL TUNNEL RELEASE    . BREAST BIOPSY Right    bx x 3 neg  . BREAST SURGERY Right    biopsy x 3 (all benign)  . CARDIAC CATHETERIZATION N/A 06/01/2016   Procedure: LEFT HEART CATH AND CORS/GRAFTS ANGIOGRAPHY;  Surgeon: Wellington Hampshire, MD;  Location: [facility];  Service: Cardiovascular;  Laterality: N/A;  . CARDIAC CATHETERIZATION N/A 06/01/2016   Procedure: Coronary Stent Intervention;  Surgeon: Wellington Hampshire, MD;  Location: [facility];  Service: Cardiovascular;  Laterality: N/A;  . CARDIOVERSION N/A 03/08/2017   Procedure: CARDIOVERSION;  Surgeon: Wellington Hampshire, MD;  Location: [facility];  Service: Cardiovascular;  Laterality: N/A;  . CATARACT EXTRACTION W/PHACO Right 01/04/2016   Procedure: CATARACT EXTRACTION PHACO AND INTRAOCULAR LENS PLACEMENT ([place]);  Surgeon: Leandrew Koyanagi, MD;  Location: [facility];  Service: Ophthalmology;  Laterality: Right;  MALYUGIN  . CATARACT EXTRACTION W/PHACO Left 01/25/2016   Procedure:  CATARACT EXTRACTION PHACO AND INTRAOCULAR LENS PLACEMENT ([place]) left eye;  Surgeon: Leandrew Koyanagi, MD;  Location: [facility];  Service: Ophthalmology;  Laterality: Left;  MALYUGIN SHUGARCAINE  . CHOLECYSTECTOMY  90's  . COLONOSCOPY WITH PROPOFOL N/A 05/03/2015   Procedure: COLONOSCOPY WITH PROPOFOL;  Surgeon: Lollie Sails, MD;  Location: [facility];  Service: Endoscopy;  Laterality: N/A;  . CORONARY ARTERY BYPASS GRAFT  98   4 vessel  . ESOPHAGOGASTRODUODENOSCOPY N/A 03/22/2015   Procedure: ESOPHAGOGASTRODUODENOSCOPY (EGD);  Surgeon: Lollie Sails, MD;  Location: [facility];  Service: Endoscopy;  Laterality: N/A;  . HEMORRHOID BANDING    . HEMORRHOID SURGERY N/A 02/17/2018   Procedure:  HEMORRHOIDECTOMY;  Surgeon: Robert Bellow, MD;  Location: [facility];  Service: General;  Laterality: N/A;  . JOINT REPLACEMENT     BILATERAL KNEE REPLACEMENTS  . KNEE ARTHROSCOPY W/ OATS PROCEDURE     Lt knee (9/01), Rt knee (3/11), Lt hip (5/10)  . TOTAL HIP ARTHROPLASTY       Allergies  Allergies  Allergen Reactions  . Beta Adrenergic Blockers Shortness Of Breath    DEPRESSION/HYPOTENSION  . Brilinta [Ticagrelor] Shortness Of Breath    Caused AFIB  . Albuterol     rigors  . Aspirin Other (See Comments)    Heartburn   . Nsaids Other (See Comments)    ULCER HISTORY & CURRENTLY ON BLOOD THINNERS  . Penicillins Hives and Other (See Comments)    Has patient had a PCN reaction causing immediate rash, facial/tongue/throat swelling, SOB or lightheadedness with hypotension: No Has patient had a PCN reaction causing severe rash involving mucus membranes or skin necrosis: No Has patient had a PCN reaction that required hospitalization No Has patient had a PCN reaction occurring within the last 10 years: No If all of the above answers are "NO", then may proceed with Cephalosporin use.   . Statins Other (See Comments)    Muscle weakness  . Sulfa Antibiotics Rash    At mouth  . Sulfasalazine Rash    At mouth  . Tetracycline Rash  . Tetracyclines & Related Rash    History of Present Illness    82 y.o. Female with history of chronic diastolic Heart Failure,Chronic Afib, CABGx4 1998, and Cath with stent 2017 is being evaluated for progressive SOB and edema x 2 days. Patient had been admitted previously on 6/14 for UTI and hyponatremia and discharged 2 days later with oral abx. The next day, 6/17 patient noticed increasing shortness of breath on exertion, leg swelling, orthopnea, and early satiety. Patient tried to get an appointment with her PCP that day, but soonest was Thursday, so she decided to wait it out. She reports that night was a horrible night with severe SOB. Tuesday  afternoon she was so short of breath she couldn't walk or prepare her food and decided to call EMS. Patient lives in her own apartment in [place]. They brought her to the ER, BNP was 1,912, troponin 0.08, Na 128. EKG with Afib, HR 103, and no acute ST/T changes. CXR revealed stable cardiomegaly with aortic atherosclerosis and left basilar atelectasis. Her weight was 192lbs (baseline 185lbs). They began IV lasix and admitted patient. Denies fever, chills, dizziness, lightheadedness, chest pain/pressure/tightness, headache, or pleuritic chest pain. Patient is a former Primary care doctor and was closely watching her HR and O2, which she reports HR had been fluctuating from 40-120's and the lowest her O2 dropped was in the mid 80's. Patient says she has  not taken eliquis since her GI bleed in May, 5/12, due to hemorrhoids, that were successfully repaired.  Today, patients weight is down to 188lbs, and SOB has improved slightly, but she is still gasping in between words.   Inpatient Medications    . amitriptyline  25 mg Oral QHS  . cefdinir  300 mg Oral BID  . enoxaparin (LOVENOX) injection  40 mg Subcutaneous Q24H  . ezetimibe  10 mg Oral Daily  . ferrous sulfate  325 mg Oral BID WC  . furosemide  20 mg Intravenous Daily  . mometasone-formoterol  2 puff Inhalation BID  . pantoprazole  40 mg Oral BID  . sodium chloride flush  3 mL Intravenous Q12H  . spironolactone  25 mg Oral q morning - 10a  . traZODone  250 mg Oral QHS  . verapamil  180 mg Oral BID    Family History    Family History  Problem Relation Age of Onset  . Thyroid disease Mother        graves disease  . Heart disease Father        rheumatic heart  . Alcohol abuse Father   . Depression Father   . Lung cancer Sister   . Depression Daughter   . Thyroid disease Daughter        hashimoto  . Depression Son   . Stroke Maternal Grandmother   . Depression Maternal Grandmother   . Hypertension Maternal Grandfather   .  Hypertension Paternal Grandfather   . Seizures Son   . Breast cancer Neg Hx    indicated that her mother is deceased. She indicated that her father is deceased. She indicated that the status of her sister is unknown. She indicated that the status of her maternal grandmother is unknown. She indicated that the status of her maternal grandfather is unknown. She indicated that the status of her paternal grandfather is unknown. She indicated that the status of her daughter is unknown. She indicated that both of her sons are alive. She indicated that the status of her neg hx is unknown.   Social History    Social History   Socioeconomic History  . Marital status: Married    Spouse name: Not on file  . Number of children: 3  . Years of education: Not on file  . Highest education level: Not on file  Occupational History  . Occupation: Retired Sport and exercise psychologist  Social Needs  . Financial resource strain: Not on file  . Food insecurity:    Worry: Not on file    Inability: Not on file  . Transportation needs:    Medical: Not on file    Non-medical: Not on file  Tobacco Use  . Smoking status: Former Research scientist (life sciences)  . Smokeless tobacco: Never Used  . Tobacco comment: quit 44+ yrs ago  Substance and Sexual Activity  . Alcohol use: No    Alcohol/week: 0.0 oz  . Drug use: No  . Sexual activity: Never  Lifestyle  . Physical activity:    Days per week: 5 days    Minutes per session: 30 min  . Stress: Not at all  Relationships  . Social connections:    Talks on phone: Not on file    Gets together: Not on file    Attends religious service: Not on file    Active member of club or organization: Not on file    Attends meetings of clubs or organizations: Not on file    Relationship status:  Not on file  . Intimate partner violence:    Fear of current or ex partner: Not on file    Emotionally abused: Not on file    Physically abused: Not on file    Forced sexual activity: Not on file  Other Topics  Concern  . Not on file  Social History Narrative   Lives in [place].  Retired Engineer, drilling.  Relatively active.   Uses a Rollator to ambulate     Review of Systems    General:  No chills, fever, night sweats or +weight changes.  Cardiovascular:  No chest pain, +dyspnea on exertion, +edema, +orthopnea, +palpitations, paroxysmal nocturnal dyspnea. Dermatological: No rash, lesions/masses Respiratory: No cough, +dyspnea Urologic: No hematuria, dysuria Abdominal:   No nausea, vomiting, +diarrhea, bright red blood per rectum, melena, or hematemesis Neurologic:  No visual changes, wkns, changes in mental status. All other systems reviewed and are otherwise negative except as noted above.  Physical Exam    Blood pressure 119/79, pulse (!) 113, temperature 98 F (36.7 C), temperature source Oral, resp. rate 18, height 5\' 3"  (1.6 m), weight 188 lb (85.3 kg), SpO2 98 %.  General: Pleasant, NAD Psych: Normal affect. Neuro: Alert and oriented X 3. Moves all extremities spontaneously. HEENT: Normal  Neck: Supple without bruits or JVD. Lungs:  Resp regular and unlabored, bibasilar crackles. Heart: irregularly irregular rhythm, no s3, s4, or murmurs. Abdomen: Soft, non-tender, non-distended, BS + x 4.  Extremities: No clubbing, cyanosis, 1+bilateral lower extremity edema. DP/PT/Radials 2+ and equal bilaterally.  Labs    Troponin (Point of Care Test) No results for input(s): TROPIPOC in the last 72 hours. Recent Labs    03/25/18 2034 03/26/18 0156 03/26/18 0730  TROPONINI 0.08* 0.09* 0.07*   Lab Results  Component Value Date   WBC 4.7 03/26/2018   HGB 10.2 (L) 03/26/2018   HCT 29.8 (L) 03/26/2018   MCV 97.5 03/26/2018   PLT 238 03/26/2018    Recent Labs  Lab 03/21/18 1849  03/26/18 0730  NA 128*   < > 131*  K 4.1   < > 4.0  CL 95*   < > 101  CO2 23   < > 23  BUN 17   < > 19  CREATININE 0.95   < > 0.84  CALCIUM 9.3   < > 8.7*  PROT 6.9  --   --   BILITOT 1.4*  --   --     ALKPHOS 59  --   --   ALT 17  --   --   AST 27  --   --   GLUCOSE 136*   < > 109*   < > = values in this interval not displayed.   Lab Results  Component Value Date   CHOL 189 01/14/2018   HDL 62.30 01/14/2018   LDLCALC 112 (H) 01/14/2018   TRIG 77.0 01/14/2018   No results found for: [facility]   Radiology Studies    Dg Chest 2 View  Result Date: 03/25/2018 CLINICAL DATA:  Swelling of the lower extremities with increasing dyspnea. EXAM: CHEST - 2 VIEW COMPARISON:  03/21/2018 FINDINGS: Stable cardiomegaly with post CABG change. Atherosclerosis of the thoracic arch without aneurysmal dilatation. Left basilar atelectasis is noted. No overt pulmonary edema, effusion or pneumothorax. No pulmonary consolidations. Axillary loose body noted of the left glenohumeral joint. Chronic mild degenerative change of the thoracic spine. Spinal fixation rods are seen of the included upper lumbar spine. IMPRESSION: Stable cardiomegaly with  aortic atherosclerosis. Left basilar atelectasis. Electronically Signed   By: Ashley Royalty M.D.   On: 03/25/2018 21:27   Ct Head Wo Contrast  Result Date: 03/19/2018 CLINICAL DATA:  82 year old female with left paralyzed vocal cord after laryngitis. Remote former smoker. EXAM: CT HEAD WITHOUT CONTRAST TECHNIQUE: Contiguous axial images were obtained from the base of the skull through the vertex without intravenous contrast. COMPARISON:  Neck CT today reported separately. Brain MRI 03/13/2017. FINDINGS: Brain: Stable cerebral volume. Chronic partially empty sella. No midline shift, ventriculomegaly, mass effect, evidence of mass lesion, intracranial hemorrhage or evidence of cortically based acute infarction. Gray-white matter differentiation is within normal limits throughout the brain. Vascular: Calcified atherosclerosis at the skull base. No suspicious intracranial vascular hyperdensity. Skull: No osseous abnormality identified. Sinuses/Orbits: Visualized paranasal sinuses and  mastoids are clear. Other: Negative visible orbit and scalp soft tissues. IMPRESSION: Normal for age non contrast CT appearance of the brain. Electronically Signed   By: Genevie Ann M.D.   On: 03/19/2018 12:11   Ct Soft Tissue Neck W Contrast  Result Date: 03/19/2018 CLINICAL DATA:  82 year old female with left paralyzed vocal cord after laryngitis. Remote former smoker. EXAM: CT NECK WITH CONTRAST TECHNIQUE: Multidetector CT imaging of the neck was performed using the standard protocol following the bolus administration of intravenous contrast. CONTRAST:  92mL ISOVUE-300 IOPAMIDOL (ISOVUE-300) INJECTION 61% COMPARISON:  Brain MRI 03/13/2017.  Chest CT today. FINDINGS: Pharynx and larynx: Medial deviation of the left vocal cord with associated asymmetric enlargement of the left laryngeal ventricle. The piriform sinuses remain fairly symmetric. No superimposed laryngeal mass. The epiglottis appears normal. Pharyngeal soft tissue contours are within normal limits, mild motion artifact at the soft palate. Negative parapharyngeal and retropharyngeal spaces. Salivary glands: Negative sublingual space. The submandibular glands and parotid glands are within normal limits. Thyroid: Mild thyromegaly. Multiple small bilateral thyroid nodules, the largest are 8 millimeter hypodense and enhancing or partially calcified nodes in the right lobe (series 20, image 85) which do not meet size criteria for ultrasound follow-up. Lymph nodes: Negative.  No cervical lymphadenopathy. Vascular: The major vascular structures in the neck and at the skull base are enhancing. There is bulky left cervical carotid calcified plaque but this primarily affects the left ECA. Calcified atherosclerosis at the skull base. Limited intracranial: Negative, see also head CT from today reported separately. Visualized orbits: Negative aside from postoperative changes to both globes. Mastoids and visualized paranasal sinuses: Clear. Skeleton: Degenerative  spondylolisthesis at C4-C5. Widespread cervical facet hypertrophy. Lower cervical advanced disc and endplate degeneration. No acute osseous abnormality identified. Upper chest: Reported separately today. No thoracic inlet or superior mediastinal mass or lymphadenopathy. IMPRESSION: 1. Evidence of left vocal cord paralysis but no causative lesion identified in the neck, or Chest (Chest CT reported separately today). 2. Benign multinodular thyroid goiter suspected. Small right thyroid nodules do not meet size criteria for ultrasound follow-up. Electronically Signed   By: Genevie Ann M.D.   On: 03/19/2018 12:23   Ct Chest W Contrast  Result Date: 03/19/2018 CLINICAL DATA:  82 year old female with left paralyzed vocal cord after laryngitis. Remote former smoker. EXAM: CT CHEST WITH CONTRAST TECHNIQUE: Multidetector CT imaging of the chest was performed during intravenous contrast administration. CONTRAST:  24mL ISOVUE-300 IOPAMIDOL (ISOVUE-300) INJECTION 61% COMPARISON:  Neck CT today reported separately. High-resolution chest CT 10/09/2016. FINDINGS: Cardiovascular: Extensive Calcified aortic atherosclerosis. Prior CABG. Extensive calcified coronary artery atherosclerosis and/or stents. Mild to moderate cardiomegaly, stable to mildly increased since 2018. No aneurysm or  dissection of the visible aorta. The other central mediastinal vascular structures appear patent. Mediastinum/Nodes: Negative mediastinum. No mass or lymphadenopathy; chronic calcified left posterior mediastinal lymph nodes appear to be post granulomatous and benign. No hilar lymphadenopathy. Lungs/Pleura: Trace retained secretions along the ventral wall of the trachea on series 4, image 50. Otherwise the major vascular structures are patent. Stable lung volumes since 2018. Mild gas trapping again suspected in both lungs. No overt emphysema. Chronic left lower lobe posterior basal segment volume loss and scarring is stable. There is a left lower lobe  posterior basal segment benign calcified granuloma. Stable mild chronic scarring in the anterior left upper lobe. No acute pulmonary opacity. No pleural effusion. Upper Abdomen: Stable visible liver with multiple circumscribed cystic appearing lesions, most with simple fluid density. Surgically absent gallbladder. Negative visible spleen, pancreas, adrenal glands, kidneys, and bowel in the upper abdomen. Musculoskeletal: Subacute to chronic right lateral 3rd through 5th rib fractures are new since October. These demonstrate little to no displacement and periosteal healing. Prior sternotomy. Exaggerated thoracic kyphosis with chronic thoracic disc and endplate degeneration. No acute osseous abnormality identified. IMPRESSION: 1. Stable CT appearance of the lungs and mediastinum since the high-resolution exam January 2018, negative for mediastinal mass or lymphadenopathy. 2. Chronic left lung scarring. Mild pulmonary gas trapping. Remote left lower lobe granulomatous disease. 3. Extensive Aortic Atherosclerosis (ICD10-I70.0). 4. Nondisplaced right lateral 3rd through 5th rib fractures are new since 2018 but appears subacute to chronic. 5. Stable and benign appearing polycystic liver disease. Electronically Signed   By: Genevie Ann M.D.   On: 03/19/2018 12:21   Dg Chest Port 1 View  Result Date: 03/21/2018 CLINICAL DATA:  Code sepsis. EXAM: PORTABLE CHEST 1 VIEW COMPARISON:  CT chest 03/19/2017. Single-view of the chest 02/17/2017. FINDINGS: Lung volumes are low. Lungs are clear. There is cardiomegaly. The patient is status post CABG. No pneumothorax or pleural effusion. Aortic atherosclerosis noted. IMPRESSION: Cardiomegaly without acute disease. Atherosclerosis. Electronically Signed   By: Inge Rise M.D.   On: 03/21/2018 19:12    ECG & Cardiac Imaging    EKG: afib, left axis, HR 103, poor R wave progression, no acute ST/T changes  10/2016 Echo Study Conclusions   - Left ventricle: The cavity size was  normal. Systolic function was   normal. The estimated ejection fraction was in the range of 60%   to 65%. Wall motion was normal; there were no regional wall   motion abnormalities. The study is not technically sufficient to   allow evaluation of LV diastolic function. - Mitral valve: There was mild regurgitation. - Left atrium: The atrium was moderately dilated. - Right ventricle: The cavity size was mildly dilated. Wall   thickness was normal. Systolic function was mildly reduced. - Pulmonary arteries: Systolic pressure was mild to moderately   elevated. PA peak pressure: 49 mm Hg (S).  Coronary Stent Intervention 05/2016  LEFT HEART CATH AND CORS/GRAFTS ANGIOGRAPHY      Prox LAD lesion, 100 %stenosed.  Ost Cx to Prox Cx lesion, 50 %stenosed.  Ost LM to LM lesion, 70 %stenosed.  Mid RCA lesion, 100 %stenosed.  Prox RCA lesion, 60 %stenosed.  SVG graft was visualized by angiography and is normal in caliber.  Post Atrio lesion, 60 %stenosed.  SVG graft was visualized by angiography and is normal in caliber.  The graft exhibits mild .  The flow in the graft is reversed.  Origin lesion, 30 %stenosed.  SVG graft was visualized by angiography.  Prox Graft lesion, 30 %stenosed.  LIMA graft was visualized by angiography and is normal in caliber and anatomically normal.  The left ventricular systolic function is normal.  LV end diastolic pressure is mildly elevated.  The left ventricular ejection fraction is 55-65% by visual estimate.  A STENT XIENCE ALPINE RX 3.0X15 drug eluting stent was successfully placed, and does not overlap previously placed stent.  Prox Graft lesion, 90 %stenosed.  Post intervention, there is a 0% residual stenosis.   1. Severe underlying three-vessel coronary artery disease with patent grafts including LIMA to LAD, SVG to diagonal, SVG to left circumflex and SVG to RCA. Severe 90% stenosis and SVG to RCA is likely the culprit for unstable  angina.  2. Normal LV systolic function and mildly elevated left ventricular end-diastolic pressure.  3. Successful drug-eluting stent placement to the SVG to RCA using a distal protection device.  Recommendations: The patient is severely intolerant to aspirin and she reports inability to take the medication. Thus, I elected to treat her with Brilinta monotherapy. Continue aggressive treatment for coronary artery disease.    Assessment & Plan    Acute on Chronic diastolic Heart Failure/SOB Progressive SOB and edema x 2 days. In ED BNP 1,912, troponin 0.08->0.09->0.07-likely due to demand ischemia  Patient weighs herself daily, reports baseline weight around 185lbs. On admission patient was 192lbs. Received IV diuresis and last night was 188lbs SOB and edema improved today, but still SOB when talking.  Still has 1+ edema Denies chest pain/tightness/pressure, pain on inspiration, lightheadedness, or dizziness Last echo 10/2016 revealed 60-65% Repeat Echo Continue IV lasix  Chronic Afib Baseline rate around 70's. Over the last couple days patient reports HR from 40-140. HR has been 90-113 due to fluid overload Patient had a GI bleed/hemerrhoids in May that was successfully repaired. Eliquis was, held but never restarted Restart eliquis  UTI Patient was admitted Friday-Sunday for urosepsis Continue abx  Signed, Cadence  Ninfa Meeker, PA-S 03/26/2018, 8:39 AM   Attending Note Patient seen and examined, agree with detailed note above,  Patient presentation and plan discussed on rounds.  Cardiology consult placed by Dr.  Tressia Miners for shortness of breath acute on chronic diastolic   EKG lab work, chest x-ray, echocardiogram reviewed independently by myself  Charlotte Wagner is a 82 year old woman with past medical history of chronic diastolic CHF normal ejection fraction 60%,coronary artery disease history of bypass surgery and prior stenting,  recent hospital admissions GI bleed  in May 2019 hemorrhoid banding, subsequent anemia, followed by urosepsis bacteremia, presenting with worsening shortness of breath  Reports that she was home for several days after recent hospital admission during which she received IV fluids. Became  progressively short of breath at home ,could not find her Lasix, presented to the emergency room Also reported having tachycardia at home on any movement rate up to 130s eliquis held after previous GI bleed in May in the setting of anemia She is concerned about hyponatremia  Other details as above On physical examination elevated JVD 12+ lungs with rales at the bases poor inspiratory effort, heart sounds irregularly irregular rapid, no murmurs appreciated, abdomen soft nontender, trace lower extremity edema  Lab work reviewed showing sodium 131, troponin 0.09 trending down, hemoglobin 10.2  EKG personally reviewed by myself showing atrial fibrillation with ventricular rate 103 bpm  A/P: Acute on chronic diastolic CHF Multifactorial including anemia, recent IV fluids in the setting of urosepsis, prior history of diastolic CHF, currently not taking her Lasix BNP  previously in the 500 range one year ago now 1900, clearly fluid overloaded Suspect some dietary indiscretion -Recommend we increase Lasix up to 40 twice a day with close monitoring of electrolytes ontinues peroneal lactone -close monitoring of blood pressure  Atrial fibrillation Would continue verapamil 180 twice a day) monitoring of heart rate  CAD/CABG Minimally elevated troponin  demand ischemia No plan for ischemic workup at this time Echocardiogram pending  Greater than 50% was spent in counseling and coordination of care with patient Total encounter time 110 minutes or more   Signed: Esmond Plants  M.D., Ph.D. [facility]

## 2018-03-26 NOTE — Progress Notes (Signed)
*  PRELIMINARY RESULTS* Echocardiogram 2D Echocardiogram has been performed.  Wallie Char Zethan Alfieri 03/26/2018, 1:02 PM

## 2018-03-26 NOTE — Progress Notes (Addendum)
[place] [facility] [place] NAME: Charlotte Wagner    MR#:  [id number]  DATE OF BIRTH:  1936/07/05  SUBJECTIVE:  CHIEF COMPLAINT:   Chief Complaint  Patient presents with  . Shortness of Breath   -Patient was just admitted to the hospital for E. coli bacteremia and was discharged on antibiotics.  Comes with worsening shortness of breath. -Treated for CHF at this time.  Breathing is slightly improved today.  Off oxygen  REVIEW OF SYSTEMS:  Review of Systems  Constitutional: Negative for chills, fever and malaise/fatigue.  HENT: Negative for congestion, ear discharge, hearing loss and nosebleeds.   Eyes: Negative for blurred vision and double vision.  Respiratory: Positive for cough, shortness of breath and wheezing.   Cardiovascular: Negative for chest pain and palpitations.  Gastrointestinal: Negative for abdominal pain, constipation, diarrhea, nausea and vomiting.  Genitourinary: Negative for dysuria.  Musculoskeletal: Positive for back pain. Negative for myalgias.  Neurological: Negative for dizziness, focal weakness, seizures, weakness and headaches.  Psychiatric/Behavioral: Negative for depression.    DRUG ALLERGIES:   Allergies  Allergen Reactions  . Beta Adrenergic Blockers Shortness Of Breath    DEPRESSION/HYPOTENSION  . Brilinta [Ticagrelor] Shortness Of Breath    Caused AFIB  . Albuterol     rigors  . Aspirin Other (See Comments)    Heartburn   . Nsaids Other (See Comments)    ULCER HISTORY & CURRENTLY ON BLOOD THINNERS  . Penicillins Hives and Other (See Comments)    Has patient had a PCN reaction causing immediate rash, facial/tongue/throat swelling, SOB or lightheadedness with hypotension: No Has patient had a PCN reaction causing severe rash involving mucus membranes or skin necrosis: No Has patient had a PCN reaction that required hospitalization No Has patient had a PCN reaction occurring within the last 10 years: No If  all of the above answers are "NO", then may proceed with Cephalosporin use.   . Statins Other (See Comments)    Muscle weakness  . Sulfa Antibiotics Rash    At mouth  . Sulfasalazine Rash    At mouth  . Tetracycline Rash  . Tetracyclines & Related Rash    VITALS:  Blood pressure 119/79, pulse (!) 113, temperature 98 F (36.7 C), temperature source Oral, resp. rate 18, height 5\' 3"  (1.6 m), weight 85.3 kg (188 lb), SpO2 98 %.  PHYSICAL EXAMINATION:  Physical Exam  GENERAL:  82 y.o.-year-old patient lying in the bed with no acute distress.  EYES: Pupils equal, round, reactive to light and accommodation. No scleral icterus. Extraocular muscles intact.  HEENT: Head atraumatic, normocephalic. Oropharynx and nasopharynx clear.  NECK:  Supple, no jugular venous distention. No thyroid enlargement, no tenderness.  LUNGS: Normal breath sounds bilaterally, no wheezing, rales,rhonchi or crepitation. No use of accessory muscles of respiration.  Diminished bibasilar breath sounds CARDIOVASCULAR: S1, S2 normal. No rubs, or gallops.  3/6 systolic murmur is present ABDOMEN: Soft, nontender, nondistended. Bowel sounds present. No organomegaly or mass.  EXTREMITIES: No cyanosis, or clubbing.  1+ pedal edema noted NEUROLOGIC: Cranial nerves II through XII are intact. Muscle strength 5/5 in all extremities. Sensation intact. Gait not checked.  PSYCHIATRIC: The patient is alert and oriented x 3.  SKIN: No obvious rash, lesion, or ulcer.    LABORATORY PANEL:   CBC Recent Labs  Lab 03/26/18 0730  WBC 4.7  HGB 10.2*  HCT 29.8*  PLT 238   ------------------------------------------------------------------------------------------------------------------  Chemistries  Recent Labs  Lab  03/21/18 1849  03/26/18 0730  NA 128*   < > 131*  K 4.1   < > 4.0  CL 95*   < > 101  CO2 23   < > 23  GLUCOSE 136*   < > 109*  BUN 17   < > 19  CREATININE 0.95   < > 0.84  CALCIUM 9.3   < > 8.7*  AST 27  --    --   ALT 17  --   --   ALKPHOS 59  --   --   BILITOT 1.4*  --   --    < > = values in this interval not displayed.   ------------------------------------------------------------------------------------------------------------------  Cardiac Enzymes Recent Labs  Lab 03/26/18 0730  TROPONINI 0.07*   ------------------------------------------------------------------------------------------------------------------  RADIOLOGY:  Dg Chest 2 View  Result Date: 03/25/2018 CLINICAL DATA:  Swelling of the lower extremities with increasing dyspnea. EXAM: CHEST - 2 VIEW COMPARISON:  03/21/2018 FINDINGS: Stable cardiomegaly with post CABG change. Atherosclerosis of the thoracic arch without aneurysmal dilatation. Left basilar atelectasis is noted. No overt pulmonary edema, effusion or pneumothorax. No pulmonary consolidations. Axillary loose body noted of the left glenohumeral joint. Chronic mild degenerative change of the thoracic spine. Spinal fixation rods are seen of the included upper lumbar spine. IMPRESSION: Stable cardiomegaly with aortic atherosclerosis. Left basilar atelectasis. Electronically Signed   By: Ashley Royalty M.D.   On: 03/25/2018 21:27    EKG:   Orders placed or performed during the hospital encounter of 03/25/18  . EKG 12-Lead  . EKG 12-Lead  . EKG 12-Lead  . EKG 12-Lead  . ED EKG  . ED EKG    ASSESSMENT AND PLAN:   82 year old female with past medical history significant for CAD s/p CABG and PCI,  carotid artery disease, spinal stenosis, hypertension, history of permanent atrial fibrillation off of anticoagulation, polymyalgia rheumatica presents to hospital from [facility] independent living due to worsening shortness of breath  1.  Acute on chronic diastolic heart failure exacerbation-recent admission requiring IV fluids could have triggered this episode. -Admit to telemetry, started bid Lasix daily. -Off oxygen.  Echocardiogram ordered -Cardiology consult  requested  2.  Recent E. coli bacteremia-continue Omnicef for now  3.  Atrial fibrillation-on verapamil.  Eliquis has been discontinued last month due to admission for GI bleed and acute anemia. -since hemoglobin is stable, consider restarting eliquis..  Will await for cardiology input  4.  GERD-Protonix  5.  Hyponatremia-secondary to CHF.  Continue IV Lasix at this time and monitor.  6.  DVT prophylaxis-Lovenox   All the records are reviewed and case discussed with Care Management/Social Workerr. Management plans discussed with the patient, family and they are in agreement.  CODE STATUS: Full code  TOTAL TIME TAKING CARE OF THIS PATIENT: 37 minutes.   POSSIBLE D/C IN 1-2 DAYS, DEPENDING ON CLINICAL CONDITION.   Gladstone Lighter M.D on 03/26/2018 at 9:07 AM  Between 7am to 6pm - Pager - [phone]  After 6pm go to [email or web address] - password [facility]  Office  [phone]  CC: Primary care physician; [name], MD

## 2018-03-26 NOTE — ED Notes (Signed)
Adm. Dr in rm 

## 2018-03-26 NOTE — Progress Notes (Signed)
Pharmacy Heart Failure Counseling  Spoke to patient about the disease state and medications. 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 Patient has excellent insight into her disease state and medications.   Ulice Dash, PharmD Clinical Pharmacist

## 2018-03-26 NOTE — Telephone Encounter (Signed)
Clinic team lead or health coach will follow.

## 2018-03-26 NOTE — Care Management Note (Addendum)
Case Management Note  Patient Details  Name: Charlotte Wagner, Dr. MRN: [id number] Date of Birth: 10-01-1936  Subjective/Objective:    Admitted to [facility] with the diagnosis of acute on chronic diastolic congestive heart failure. Lives alone in an apartment at [facility]. Retired Environmental health practitioner. Last seen Dr. [name] 03/18/18 Daughter is Benjamine Mola [phone]). Prescriptions are filled at [facility]. Outpatient physical and occupational therapy at the [facility] x 1. No home oxygen. Rolling walker and cane in the home.  Takes care of all basic activities of daily living herself. One fall a long time ago. Good appetite. Friend will transport               Action/Plan: No follow-up needs identified at this time.   Expected Discharge Date:                  Expected Discharge Plan:     In-House Referral:     Discharge planning Services     Post Acute Care Choice:    Choice offered to:     DME Arranged:    DME Agency:     HH Arranged:    HH Agency:     Status of Service:     If discussed at [employer], dates discussed:    Additional Comments:  [place] Management [phone] 03/26/2018, 1:19 PM

## 2018-03-26 NOTE — Telephone Encounter (Signed)
Patient was re- admitted on 03/25/18 FYI.

## 2018-03-26 NOTE — Telephone Encounter (Signed)
FYI.  Reviewed.  Pt is back in hospital now.  She went in with sob, etc.  Was admitted 03/25/18.

## 2018-03-27 ENCOUNTER — Telehealth: Payer: Self-pay | Admitting: *Deleted

## 2018-03-27 ENCOUNTER — Inpatient Hospital Stay: Payer: Medicare Other | Admitting: Internal Medicine

## 2018-03-27 DIAGNOSIS — I255 Ischemic cardiomyopathy: Secondary | ICD-10-CM

## 2018-03-27 DIAGNOSIS — I5043 Acute on chronic combined systolic (congestive) and diastolic (congestive) heart failure: Secondary | ICD-10-CM

## 2018-03-27 LAB — BASIC METABOLIC PANEL
ANION GAP: 8 (ref 5–15)
BUN: 19 mg/dL (ref 6–20)
CO2: 23 mmol/L (ref 22–32)
Calcium: 8.9 mg/dL (ref 8.9–10.3)
Chloride: 101 mmol/L (ref 101–111)
Creatinine, Ser: 0.96 mg/dL (ref 0.44–1.00)
GFR calc Af Amer: >60 mL/min (ref >60)
GFR, EST NON AFRICAN AMERICAN: 55 mL/min — AB (ref >60)
GLUCOSE: 126 mg/dL — AB (ref 65–99)
POTASSIUM: 3.6 mmol/L (ref 3.5–5.1)
Sodium: 132 mmol/L — ABNORMAL LOW (ref 135–145)

## 2018-03-27 MED ORDER — RISAQUAD PO CAPS
2.0000 | ORAL_CAPSULE | Freq: Three times a day (TID) | ORAL | Status: DC
  Administered 2018-03-27 – 2018-04-01 (×15): 2 via ORAL
  Filled 2018-03-27 (×15): qty 2

## 2018-03-27 MED ORDER — BACID PO TABS
2.0000 | ORAL_TABLET | Freq: Three times a day (TID) | ORAL | Status: DC
  Filled 2018-03-27: qty 2

## 2018-03-27 MED ORDER — POLYETHYLENE GLYCOL 3350 17 G PO PACK
17.0000 g | PACK | Freq: Every day | ORAL | Status: DC
  Administered 2018-03-27 – 2018-04-01 (×5): 17 g via ORAL
  Filled 2018-03-27 (×7): qty 1

## 2018-03-27 MED ORDER — RAMIPRIL 5 MG PO CAPS
5.0000 mg | ORAL_CAPSULE | Freq: Every day | ORAL | Status: DC
  Administered 2018-03-27 – 2018-04-01 (×6): 5 mg via ORAL
  Filled 2018-03-27 (×6): qty 1

## 2018-03-27 NOTE — Progress Notes (Addendum)
Progress Note  Patient Name: Charlotte Wagner, Dr. Date of Encounter: 03/27/2018  Primary Cardiologist: Rockey Situ  Subjective   SOB improving. Lower extremity swelling improving. Ambulated in her room without difficulty. No chest pain. Documented UOP of 1.5 L for the past 24 hours and a net - 3.6 L for the admission. Weight 192-->188-->183 pounds this morning. On IV Lasix 40 mg bid. Renal function 0.86-->0.96, K+ 4.0-->3.6, Na 131-->131. Echo showed a newly reduced EF of 30-35%, anteroseptal, anterior, and apical HK with basal wall motion best preserved, mild MR, mildly to moderately dilated left atrium, moderately dilated RV cavity size with moderately to severely reduced RV systolic function, mildly dilated right atrium, PFO noted, moderate to severe TR, PASP 58 mmHg.   Inpatient Medications    Scheduled Meds: . amitriptyline  25 mg Oral QHS  . apixaban  5 mg Oral BID  . cefdinir  300 mg Oral BID  . ezetimibe  10 mg Oral Daily  . ferrous sulfate  325 mg Oral BID WC  . furosemide  40 mg Intravenous BID  . mometasone-formoterol  2 puff Inhalation BID  . pantoprazole  40 mg Oral BID  . sodium chloride flush  3 mL Intravenous Q12H  . spironolactone  25 mg Oral q morning - 10a  . traZODone  250 mg Oral QHS  . verapamil  180 mg Oral BID   Continuous Infusions:  PRN Meds: acetaminophen **OR** acetaminophen, ondansetron **OR** ondansetron (ZOFRAN) IV   Vital Signs    Vitals:   03/26/18 1946 03/26/18 2220 03/27/18 0402 03/27/18 0751  BP: (!) 121/93 (!) 110/95 115/75 127/74  Pulse: 100 (!) 102 86 94  Resp: 16  18 18   Temp: 97.7 F (36.5 C)  97.7 F (36.5 C) 98.2 F (36.8 C)  TempSrc: Oral  Oral Oral  SpO2: 98%  95% 96%  Weight:   183 lb 4.8 oz (83.1 kg)   Height:        Intake/Output Summary (Last 24 hours) at 03/27/2018 0753 Last data filed at 03/27/2018 0402 Gross per 24 hour  Intake 360 ml  Output 1900 ml  Net -1540 ml   Filed Weights   03/25/18 2028 03/26/18 0109  03/27/18 0402  Weight: 192 lb (87.1 kg) 188 lb (85.3 kg) 183 lb 4.8 oz (83.1 kg)    Telemetry    Afib, 80s to 90s bpm mostly with a rare reading into the 110s to 120s bpm, occasional PVCs at times in a pattern of couplets - Personally Reviewed  ECG    n/a - Personally Reviewed  Physical Exam   GEN: No acute distress.   Neck: JVD elevated ~ 12 cm. Cardiac: Irregularly irregular, II/VI systolic murmur LUSB, no rubs, or gallops.  Respiratory: Diminished breath sounds bilaterally with faint bibasilar crackles and expiratory wheezing.  GI: Soft, nontender, non-distended.   MS: 1+ bilateral lower extremity edema; No deformity. Neuro:  Alert and oriented x 3; Nonfocal.  Psych: Normal affect.  Labs    Chemistry Recent Labs  Lab 03/21/18 1849  03/25/18 2034 03/26/18 0730 03/27/18 0431  NA 128*   < > 129* 131* 132*  K 4.1   < > 4.1 4.0 3.6  CL 95*   < > 99* 101 101  CO2 23   < > 20* 23 23  GLUCOSE 136*   < > 120* 109* 126*  BUN 17   < > 17 19 19   CREATININE 0.95   < > 0.76 0.84 0.96  CALCIUM 9.3   < > 9.2 8.7* 8.9  PROT 6.9  --   --   --   --   ALBUMIN 4.2  --   --   --   --   AST 27  --   --   --   --   ALT 17  --   --   --   --   ALKPHOS 59  --   --   --   --   BILITOT 1.4*  --   --   --   --   GFRNONAA 55*   < > >60 >60 55*  GFRAA >60   < > >60 >60 >60  ANIONGAP 10   < > 10 7 8    < > = values in this interval not displayed.     Hematology Recent Labs  Lab 03/22/18 0444 03/25/18 2034 03/26/18 0730  WBC 7.8 4.9 4.7  RBC 3.13* 3.25* 3.05*  HGB 10.6* 10.9* 10.2*  HCT 31.1* 32.1* 29.8*  MCV 99.4 98.9 97.5  MCH 33.8 33.7 33.5  MCHC 34.0 34.1 34.3  RDW 18.1* 17.2* 16.7*  PLT 137* 238 238    Cardiac Enzymes Recent Labs  Lab 03/25/18 2034 03/26/18 0156 03/26/18 0730 03/26/18 1336  TROPONINI 0.08* 0.09* 0.07* 0.07*   No results for input(s): TROPIPOC in the last 168 hours.   BNP Recent Labs  Lab 03/25/18 2034  BNP 1,912.0*     DDimer No results for  input(s): DDIMER in the last 168 hours.   Radiology    Dg Chest 2 View  Result Date: 03/25/2018 IMPRESSION: Stable cardiomegaly with aortic atherosclerosis. Left basilar atelectasis. Electronically Signed   By: Ashley Royalty M.D.   On: 03/25/2018 21:27    Cardiac Studies   TTE 03/26/2018: Study Conclusions  - Left ventricle: The cavity size was normal. Systolic function was   moderately to severely reduced. The estimated ejection fraction   was in the range of 30% to 35%. Hypokinesis of the anteroseptal   myocardium. Hypokinesis of the anterior myocardium. Basal wall   motion best preserved. Hypokinesis of the apical myocardium. - Mitral valve: There was mild regurgitation. - Left atrium: The atrium was mildly to moderately dilated. - Right ventricle: The cavity size was moderately dilated. Wall   thickness was normal. Systolic function was moderately to   severely reduced. - Right atrium: The atrium was mildly dilated. - Atrial septum: There was a patent foramen ovale. - Tricuspid valve: There was moderate-severe regurgitation. - Pulmonary arteries: Systolic pressure was moderate to severely   elevated PA peak pressure: 58 mm Hg (S).  Impressions:  - rhythm is atrial fibrillation.  Patient Profile     82 y.o. female with history of CAD s/p 4-vessel CABG in 1998 s/p PCI to the SVG-RCA in 05/2016, persistent Afib previously on Eliquis, chronic diastolic CHF, HTN, HLD, GI bleed in 02/2018 who was admitted with acute on chronic diastolic CHF.   Assessment & Plan    1. Acute systolic CHF/acute on chronic diastolic CHF/pulmonary hypertension: -She remains volume up -Continue IV Lasix 40 mg bid with KCl repletion -Will need R/LHC prior to discharge -Would ideally like to stop verapamil given her new cardiomyopathy, though she is intolerant of beta blockers secondary to SOB, hypotension, bradycardia, and depression -Restart ramipril 5 mg daily -Continue spironolactone   2.  Persistent Afib: -Ventricular rates reasonably controlled as above -DOAC held 02/2018 in the setting of GI bleed, restarted 6/19 -Intolerance of  beta blockers as above, she prefers to remain on verapamil (understands not ideal given her cardiomyopathy)  3. Elevated troponin/CAD as above: -Troponin minimally elevated and flat trending with a peak of 0.09 -New cardiomyopathy as above -Plan for [facility] prior to discharge   4. Anemia: -Stable -Now back on DOAC  5. HTN: -BP stable  6. Recent GI bleed: -Felt to be secondary to hemorrhoids -Anticoagulation resumed 03/26/2018  For questions or updates, please contact [place] Please consult [email or web address] for contact info under Cardiology/STEMI.    Signed, Christell Faith, PA-C [place] Pager: [phone] 03/27/2018, 7:53 AM

## 2018-03-27 NOTE — Telephone Encounter (Signed)
FYI

## 2018-03-27 NOTE — Telephone Encounter (Signed)
Was scheduled with Tull[facility] at 51 patient is currently admitted.

## 2018-03-27 NOTE — Telephone Encounter (Signed)
Copied from [place]. Topic: Quick Communication - Appointment Cancellation >> Mar 27, 2018  7:50 AM Lennox Solders wrote: Patient called to cancel appointment scheduled for tullo Patient  HAS [id number]} rescheduled their appointment. Pt is back in hospital [facility]  Route to department's PEC pool.

## 2018-03-27 NOTE — Evaluation (Signed)
Physical Therapy Evaluation Patient Details Name: Charlotte Wagner, Dr. MRN: [id number] DOB: 24-Oct-1935 Today's Date: 03/27/2018   History of Present Illness  82 yo female with onset of LE weakness, declining cardiac function, SOB and LE edema, recent GI bleed with low Na+, EF 30-35% and recent Ecoli sepsis was admitted.  Has atelectasis and cardiomegaly, low O2 sats with effort.  PMHx:  CAD, CABG, weakness, ischemic cardiomyopathy, CHF,   Clinical Impression  Pt was seen for evaluation of mobility and noted a significant amount of difficulty controlling her vitals with gait, pulses up to 118 and O2 sats are lower with effort.  Pt can walk with supervised gait and will benefit ultimately from cardiac rehab outpatient when HHPT is finished.  Follow pt for strengthening and control of standing balance until dc to home.    Follow Up Recommendations Home health PT    Equipment Recommendations  Rolling walker with 5" wheels    Recommendations for Other Services       Precautions / Restrictions Precautions Precautions: Fall(telemetry noting a-fib) Restrictions Weight Bearing Restrictions: No      Mobility  Bed Mobility Overal bed mobility: Needs Assistance Bed Mobility: Supine to Sit;Sit to Supine     Supine to sit: Supervision Sit to supine: Supervision   General bed mobility comments: uses bed rail to maneuver  Transfers Overall transfer level: Needs assistance Equipment used: Rolling walker (2 wheeled);1 person hand held assist Transfers: Sit to/from [facility] Sit to Stand: Supervision Stand pivot transfers: Supervision          Ambulation/Gait Ambulation/Gait assistance: Min guard Gait Distance (Feet): 100 Feet Assistive device: Rolling walker (2 wheeled) Gait Pattern/deviations: Step-through pattern;Step-to pattern;Decreased stride length;Narrow base of support;Trunk flexed Gait velocity: reduced Gait velocity interpretation: <1.31 ft/sec,  indicative of household ambulator General Gait Details: Minimal reliance on walker and reminded her about management of her low EF with smaller walks  Stairs            Wheelchair Mobility    Modified Rankin (Stroke Patients Only)       Balance Overall balance assessment: Modified Independent                                           Pertinent Vitals/Pain Pain Assessment: No/denies pain    Home Living Family/patient expects to be discharged to:: Private residence Living Arrangements: Alone Available Help at Discharge: Neighbor Type of Home: Apartment Home Access: Level entry     Home Layout: One level Home Equipment: Environmental consultant - 4 wheels      Prior Function Level of Independence: Independent with assistive device(s)         Comments: has tried to be active and exercising at [employer]        Extremity/Trunk Assessment   Upper Extremity Assessment Upper Extremity Assessment: Overall WFL for tasks assessed    Lower Extremity Assessment Lower Extremity Assessment: Generalized weakness    Cervical / Trunk Assessment Cervical / Trunk Assessment: Normal  Communication   Communication: No difficulties  Cognition Arousal/Alertness: Awake/alert Behavior During Therapy: WFL for tasks assessed/performed Overall Cognitive Status: Within Functional Limits for tasks assessed  General Comments      Exercises     Assessment/Plan    PT Assessment Patient needs continued PT services  PT Problem List Decreased strength;Decreased range of motion;Decreased activity tolerance;Decreased mobility;Decreased balance;Decreased coordination;Decreased knowledge of use of DME;Decreased safety awareness;Cardiopulmonary status limiting activity       PT Treatment Interventions DME instruction;Gait training;Functional mobility training;Therapeutic activities;Therapeutic exercise;Balance  training;Neuromuscular re-education;Patient/family education    PT Goals (Current goals can be found in the Care Plan section)  Acute Rehab PT Goals Patient Stated Goal: go home PT Goal Formulation: With patient Time For Goal Achievement: 04/10/18 Potential to Achieve Goals: Good    Frequency Min 2X/week   Barriers to discharge Decreased caregiver support home alone    Co-evaluation               AM-PAC PT "6 Clicks" Daily Activity  Outcome Measure Difficulty turning over in bed (including adjusting bedclothes, sheets and blankets)?: None Difficulty moving from lying on back to sitting on the side of the bed? : None Difficulty sitting down on and standing up from a chair with arms (e.g., wheelchair, bedside commode, etc,.)?: A Little Help needed moving to and from a bed to chair (including a wheelchair)?: A Little Help needed walking in hospital room?: A Little Help needed climbing 3-5 steps with a railing? : A Little 6 Click Score: 20    End of Session Equipment Utilized During Treatment: Gait belt Activity Tolerance: Patient tolerated treatment well Patient left: in bed;with call bell/phone within reach;with bed alarm set Nurse Communication: Mobility status(pulses up to 118) PT Visit Diagnosis: Unsteadiness on feet (R26.81);Muscle weakness (generalized) (M62.81);Difficulty in walking, not elsewhere classified (R26.2)    Time: 9767-3419 PT Time Calculation (min) (ACUTE ONLY): 45 min   Charges:   PT Evaluation $PT Eval Moderate Complexity: 1 Mod PT Treatments $Gait Training: 8-22 mins $Therapeutic Exercise: 8-22 mins   PT G Codes:   PT G-Codes **NOT FOR INPATIENT CLASS** Functional Assessment Tool Used: AM-PAC 6 Clicks Basic Mobility    Ramond Dial 03/27/2018, 11:29 PM   Mee Hives, PT MS Acute Rehab Dept. Number: [place] [facility] [place]

## 2018-03-27 NOTE — Progress Notes (Signed)
[facility] [place] NAME: Charlotte Wagner    MR#:  [id number]  DATE OF BIRTH:  09/15/1936  SUBJECTIVE:  CHIEF COMPLAINT:   Chief Complaint  Patient presents with  . Shortness of Breath   - still has JVD and short of breathing, diuresing well - requesting for probiotics  REVIEW OF SYSTEMS:  Review of Systems  Constitutional: Negative for chills, fever and malaise/fatigue.  HENT: Negative for congestion, ear discharge, hearing loss and nosebleeds.   Eyes: Negative for blurred vision and double vision.  Respiratory: Positive for shortness of breath and wheezing. Negative for cough.   Cardiovascular: Negative for chest pain and palpitations.  Gastrointestinal: Negative for abdominal pain, constipation, diarrhea, nausea and vomiting.  Genitourinary: Negative for dysuria.  Musculoskeletal: Positive for back pain. Negative for myalgias.  Neurological: Negative for dizziness, focal weakness, seizures, weakness and headaches.  Psychiatric/Behavioral: Negative for depression.    DRUG ALLERGIES:   Allergies  Allergen Reactions  . Beta Adrenergic Blockers Shortness Of Breath    DEPRESSION/HYPOTENSION  . Brilinta [Ticagrelor] Shortness Of Breath    Caused AFIB  . Albuterol     rigors  . Aspirin Other (See Comments)    Heartburn   . Nsaids Other (See Comments)    ULCER HISTORY & CURRENTLY ON BLOOD THINNERS  . Penicillins Hives and Other (See Comments)    Has patient had a PCN reaction causing immediate rash, facial/tongue/throat swelling, SOB or lightheadedness with hypotension: No Has patient had a PCN reaction causing severe rash involving mucus membranes or skin necrosis: No Has patient had a PCN reaction that required hospitalization No Has patient had a PCN reaction occurring within the last 10 years: No If all of the above answers are "NO", then may proceed with Cephalosporin use.   . Statins Other (See Comments)    Muscle weakness   . Sulfa Antibiotics Rash    At mouth  . Sulfasalazine Rash    At mouth  . Tetracycline Rash  . Tetracyclines & Related Rash    VITALS:  Blood pressure 127/74, pulse 94, temperature 98.2 F (36.8 C), temperature source Oral, resp. rate 18, height 5\' 3"  (1.6 m), weight 83.1 kg (183 lb 4.8 oz), SpO2 96 %.  PHYSICAL EXAMINATION:  Physical Exam  GENERAL:  82 y.o.-year-old patient lying in the bed with no acute distress.  EYES: Pupils equal, round, reactive to light and accommodation. No scleral icterus. Extraocular muscles intact.  HEENT: Head atraumatic, normocephalic. Oropharynx and nasopharynx clear.  NECK:  Supple, no jugular venous distention. No thyroid enlargement, no tenderness.  LUNGS: Normal breath sounds bilaterally, no wheezing, rales,rhonchi or crepitation. No use of accessory muscles of respiration.  Diminished bibasilar breath sounds CARDIOVASCULAR: S1, S2 normal. No rubs, or gallops.  3/6 systolic murmur is present ABDOMEN: Soft, nontender, nondistended. Bowel sounds present. No organomegaly or mass.  EXTREMITIES: No cyanosis, or clubbing.  1+ pedal edema noted NEUROLOGIC: Cranial nerves II through XII are intact. Muscle strength 5/5 in all extremities. Sensation intact. Gait not checked.  PSYCHIATRIC: The patient is alert and oriented x 3.  SKIN: No obvious rash, lesion, or ulcer.    LABORATORY PANEL:   CBC Recent Labs  Lab 03/26/18 0730  WBC 4.7  HGB 10.2*  HCT 29.8*  PLT 238   ------------------------------------------------------------------------------------------------------------------  Chemistries  Recent Labs  Lab 03/21/18 1849  03/27/18 0431  NA 128*   < > 132*  K 4.1   < > 3.6  CL 95*   < > 101  CO2 23   < > 23  GLUCOSE 136*   < > 126*  BUN 17   < > 19  CREATININE 0.95   < > 0.96  CALCIUM 9.3   < > 8.9  AST 27  --   --   ALT 17  --   --   ALKPHOS 59  --   --   BILITOT 1.4*  --   --    < > = values in this interval not displayed.    ------------------------------------------------------------------------------------------------------------------  Cardiac Enzymes Recent Labs  Lab 03/26/18 1336  TROPONINI 0.07*   ------------------------------------------------------------------------------------------------------------------  RADIOLOGY:  Dg Chest 2 View  Result Date: 03/25/2018 CLINICAL DATA:  Swelling of the lower extremities with increasing dyspnea. EXAM: CHEST - 2 VIEW COMPARISON:  03/21/2018 FINDINGS: Stable cardiomegaly with post CABG change. Atherosclerosis of the thoracic arch without aneurysmal dilatation. Left basilar atelectasis is noted. No overt pulmonary edema, effusion or pneumothorax. No pulmonary consolidations. Axillary loose body noted of the left glenohumeral joint. Chronic mild degenerative change of the thoracic spine. Spinal fixation rods are seen of the included upper lumbar spine. IMPRESSION: Stable cardiomegaly with aortic atherosclerosis. Left basilar atelectasis. Electronically Signed   By: Ashley Royalty M.D.   On: 03/25/2018 21:27    EKG:   Orders placed or performed during the hospital encounter of 03/25/18  . EKG 12-Lead  . EKG 12-Lead  . EKG 12-Lead  . EKG 12-Lead  . ED EKG  . ED EKG    ASSESSMENT AND PLAN:   82 year old female with past medical history significant for CAD s/p CABG and PCI,  carotid artery disease, spinal stenosis, hypertension, history of permanent atrial fibrillation off of anticoagulation, polymyalgia rheumatica presents to hospital from [facility] independent living due to worsening shortness of breath  1.  Acute on chronic diastolic heart failure exacerbation-  - ECHO with EF dropped down to 30-35%.could be stress induced cradiomyopathy -on telemetry, continue IV bid Lasix. -Off oxygen  -Cardiology consult appreciated - on ramipril. On verapamil as allergic to beta blockers  2.  Recent E. coli bacteremia-continue Omnicef for now Probiotics added  3.   Atrial fibrillation-on verapamil.  Eliquis has been discontinued last month due to admission for GI bleed and acute anemia. -since hemoglobin is stable, restarted eliquis.  4.  GERD-Protonix  5.  Hyponatremia-secondary to CHF.  Continue IV Lasix  - sodium is improving  6.  DVT prophylaxis-Lovenox  PT consult today   All the records are reviewed and case discussed with Care Management/Social Worker. Management plans discussed with the patient, family and they are in agreement.  CODE STATUS: Full code  TOTAL TIME TAKING CARE OF THIS PATIENT: 36 minutes.   POSSIBLE D/C IN 1-2 DAYS, DEPENDING ON CLINICAL CONDITION.   Zoe Creasman M.D on 03/27/2018 at 10:09 AM  Between 7am to 6pm - Pager - [phone]  After 6pm go to [email or web address] - password [facility]  Office  [phone]  CC: Primary care physician; [name], MD

## 2018-03-27 NOTE — Progress Notes (Deleted)
Spoke to [employer] [phone]).  [facility] is on diversion right now and they state that it is unlikely that patient will be transferred today.    Patient is stable at this time, sinus brady ~50bpm.

## 2018-03-28 DIAGNOSIS — E782 Mixed hyperlipidemia: Secondary | ICD-10-CM

## 2018-03-28 LAB — BASIC METABOLIC PANEL
Anion gap: 13 (ref 5–15)
BUN: 23 mg/dL — AB (ref 6–20)
CHLORIDE: 97 mmol/L — AB (ref 101–111)
CO2: 23 mmol/L (ref 22–32)
CREATININE: 1.1 mg/dL — AB (ref 0.44–1.00)
Calcium: 8.6 mg/dL — ABNORMAL LOW (ref 8.9–10.3)
GFR calc Af Amer: 54 mL/min — ABNORMAL LOW (ref >60)
GFR calc non Af Amer: 46 mL/min — ABNORMAL LOW (ref >60)
GLUCOSE: 149 mg/dL — AB (ref 65–99)
Potassium: 3.4 mmol/L — ABNORMAL LOW (ref 3.5–5.1)
SODIUM: 133 mmol/L — AB (ref 135–145)

## 2018-03-28 LAB — APTT
APTT: 34 s (ref 24–36)
aPTT: 62 seconds — ABNORMAL HIGH (ref 24–36)

## 2018-03-28 LAB — CBC
HEMATOCRIT: 30.8 % — AB (ref 35.0–47.0)
Hemoglobin: 10.7 g/dL — ABNORMAL LOW (ref 12.0–16.0)
MCH: 34 pg (ref 26.0–34.0)
MCHC: 34.7 g/dL (ref 32.0–36.0)
MCV: 97.9 fL (ref 80.0–100.0)
Platelets: 279 10*3/uL (ref 150–440)
RBC: 3.14 MIL/uL — AB (ref 3.80–5.20)
RDW: 16.9 % — ABNORMAL HIGH (ref 11.5–14.5)
WBC: 6.7 10*3/uL (ref 3.6–11.0)

## 2018-03-28 LAB — HEMOGLOBIN A1C
HEMOGLOBIN A1C: 4.8 % (ref 4.8–5.6)
MEAN PLASMA GLUCOSE: 91.06 mg/dL

## 2018-03-28 LAB — PROTIME-INR
INR: 1.34
Prothrombin Time: 16.5 seconds — ABNORMAL HIGH (ref 11.4–15.2)

## 2018-03-28 LAB — HEPARIN LEVEL (UNFRACTIONATED): Heparin Unfractionated: >3.6 IU/mL — ABNORMAL HIGH (ref 0.30–0.70)

## 2018-03-28 MED ORDER — HEPARIN (PORCINE) IN NACL 100-0.45 UNIT/ML-% IJ SOLN
1150.0000 [IU]/h | INTRAMUSCULAR | Status: DC
  Administered 2018-03-28: 1000 [IU]/h via INTRAVENOUS
  Administered 2018-03-28 – 2018-03-31 (×4): 1150 [IU]/h via INTRAVENOUS
  Filled 2018-03-28 (×5): qty 250

## 2018-03-28 MED ORDER — HEPARIN BOLUS VIA INFUSION
1050.0000 [IU] | Freq: Once | INTRAVENOUS | Status: AC
  Administered 2018-03-28: 1050 [IU] via INTRAVENOUS
  Filled 2018-03-28: qty 1050

## 2018-03-28 MED ORDER — POTASSIUM CHLORIDE CRYS ER 20 MEQ PO TBCR
20.0000 meq | EXTENDED_RELEASE_TABLET | Freq: Once | ORAL | Status: AC
  Administered 2018-03-28: 20 meq via ORAL
  Filled 2018-03-28: qty 1

## 2018-03-28 MED ORDER — FUROSEMIDE 10 MG/ML IJ SOLN: 20.0000 mg | Freq: Every day | INTRAMUSCULAR | Status: DC

## 2018-03-28 MED ORDER — NITROGLYCERIN 0.4 MG SL SUBL
SUBLINGUAL_TABLET | SUBLINGUAL | Status: AC
  Filled 2018-03-28: qty 1

## 2018-03-28 MED ORDER — NITROGLYCERIN 0.4 MG SL SUBL
0.4000 mg | SUBLINGUAL_TABLET | SUBLINGUAL | Status: DC | PRN
  Administered 2018-03-28 (×2): 0.4 mg via SUBLINGUAL
  Filled 2018-03-28: qty 1

## 2018-03-28 NOTE — Care Management Important Message (Signed)
Copy of signed IM left with patient in room.  

## 2018-03-28 NOTE — Plan of Care (Signed)
Continues to diurese.  Mild dyspnea on exertion continues.
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
## 2018-03-28 NOTE — Progress Notes (Signed)
Progress Note  Patient Name: Charlotte Wagner, Dr. Date of Encounter: 03/28/2018  Primary Cardiologist: Rockey Situ  Subjective   SOB and lower extremity swelling much improved. Continues to note mild low back pain. Documented UOP of 1.5 L for the past 24 hours and a net - 5.2 L for the admission. Weight 183-->182 pounds. Slight bump in BUN/SCr from 19/0.96-->23/1.10. Potassium 3.6-->3.4. Na 132-->133. Planning for [facility] on 6/24 given new cardiomyopathy noted on TTE this admission as below.   Inpatient Medications    Scheduled Meds: . acidophilus  2 capsule Oral TID  . amitriptyline  25 mg Oral QHS  . apixaban  5 mg Oral BID  . cefdinir  300 mg Oral BID  . ezetimibe  10 mg Oral Daily  . ferrous sulfate  325 mg Oral BID WC  . furosemide  20 mg Intravenous Daily  . mometasone-formoterol  2 puff Inhalation BID  . pantoprazole  40 mg Oral BID  . polyethylene glycol  17 g Oral Daily  . ramipril  5 mg Oral Daily  . sodium chloride flush  3 mL Intravenous Q12H  . spironolactone  25 mg Oral q morning - 10a  . traZODone  250 mg Oral QHS  . verapamil  180 mg Oral BID   Continuous Infusions:  PRN Meds: acetaminophen **OR** acetaminophen, ondansetron **OR** ondansetron (ZOFRAN) IV   Vital Signs    Vitals:   03/27/18 1702 03/27/18 2031 03/28/18 0341 03/28/18 0733  BP: 116/77 98/65 109/69 108/72  Pulse: 83 73 78 78  Resp: 18 17 18 14   Temp: 98 F (36.7 C) 98.8 F (37.1 C) 98 F (36.7 C) 97.7 F (36.5 C)  TempSrc: Oral Oral Oral Oral  SpO2: 97% 98% 95% 93%  Weight:   182 lb (82.6 kg)   Height:        Intake/Output Summary (Last 24 hours) at 03/28/2018 0813 Last data filed at 03/28/2018 0341 Gross per 24 hour  Intake 120 ml  Output 1700 ml  Net -1580 ml   Filed Weights   03/26/18 0109 03/27/18 0402 03/28/18 0341  Weight: 188 lb (85.3 kg) 183 lb 4.8 oz (83.1 kg) 182 lb (82.6 kg)    Telemetry    Afib - Personally Reviewed  ECG    NSR - Personally Reviewed  Physical  Exam   GEN: No acute distress.   Neck: No JVD. Cardiac: Irregularly irregular, II/VI systolic murmur LUSB, no rubs, or gallops.  Respiratory: Improved breath sounds.  GI: Soft, nontender, non-distended.   MS: Improved lower extremity edema; No deformity. Neuro:  Alert and oriented x 3; Nonfocal.  Psych: Normal affect.  Labs    Chemistry Recent Labs  Lab 03/21/18 1849  03/26/18 0730 03/27/18 0431 03/28/18 0443  NA 128*   < > 131* 132* 133*  K 4.1   < > 4.0 3.6 3.4*  CL 95*   < > 101 101 97*  CO2 23   < > 23 23 23   GLUCOSE 136*   < > 109* 126* 149*  BUN 17   < > 19 19 23*  CREATININE 0.95   < > 0.84 0.96 1.10*  CALCIUM 9.3   < > 8.7* 8.9 8.6*  PROT 6.9  --   --   --   --   ALBUMIN 4.2  --   --   --   --   AST 27  --   --   --   --   ALT  17  --   --   --   --   ALKPHOS 59  --   --   --   --   BILITOT 1.4*  --   --   --   --   GFRNONAA 55*   < > >60 55* 46*  GFRAA >60   < > >60 >60 54*  ANIONGAP 10   < > 7 8 13    < > = values in this interval not displayed.     Hematology Recent Labs  Lab 03/22/18 0444 03/25/18 2034 03/26/18 0730  WBC 7.8 4.9 4.7  RBC 3.13* 3.25* 3.05*  HGB 10.6* 10.9* 10.2*  HCT 31.1* 32.1* 29.8*  MCV 99.4 98.9 97.5  MCH 33.8 33.7 33.5  MCHC 34.0 34.1 34.3  RDW 18.1* 17.2* 16.7*  PLT 137* 238 238    Cardiac Enzymes Recent Labs  Lab 03/25/18 2034 03/26/18 0156 03/26/18 0730 03/26/18 1336  TROPONINI 0.08* 0.09* 0.07* 0.07*   No results for input(s): TROPIPOC in the last 168 hours.   BNP Recent Labs  Lab 03/25/18 2034  BNP 1,912.0*     DDimer No results for input(s): DDIMER in the last 168 hours.   Radiology    No results found.  Cardiac Studies   TTE 03/26/2018: Study Conclusions  - Left ventricle: The cavity size was normal. Systolic function was moderately to severely reduced. The estimated ejection fraction was in the range of 30% to 35%. Hypokinesis of the anteroseptal myocardium. Hypokinesis of the anterior  myocardium. Basal wall motion best preserved. Hypokinesis of the apical myocardium. - Mitral valve: There was mild regurgitation. - Left atrium: The atrium was mildly to moderately dilated. - Right ventricle: The cavity size was moderately dilated. Wall thickness was normal. Systolic function was moderately to severely reduced. - Right atrium: The atrium was mildly dilated. - Atrial septum: There was a patent foramen ovale. - Tricuspid valve: There was moderate-severe regurgitation. - Pulmonary arteries: Systolic pressure was moderate to severely elevated PA peak pressure: 58 mm Hg (S).  Impressions:  - rhythm is atrial fibrillation.  Patient Profile     82 y.o. female with history of CAD s/p 4-vessel CABG in 1998 s/p PCI to the SVG-RCA in 05/2016, persistent Afib previously on Eliquis, chronic diastolic CHF, HTN, HLD, GI bleed in 02/2018 who was admitted with acute on chronic diastolic CHF.   Assessment & Plan    1. Acute systolic CHF/acute on chronic diastolic CHF/pulmonary hypertension: -Good diuresis this admission, now appears mostly euvolemic with bump in BUN/SCr -Hold IV Lasix this morning given bump in SCr -Likely resume PO Lasix on 6/22 -Will need LHC prior to discharge, scheduled for 6/24 -Would ideally like to stop verapamil given her new cardiomyopathy, though she is intolerant of beta blockers secondary to SOB, hypotension, bradycardia, and depression -Continue ramipril 5 mg daily (PTA 10 mg daily) -Continue spironolactone   2. Persistent Afib: -Ventricular rates reasonably controlled as above -DOAC held 02/2018 in the setting of GI bleed, restarted 6/19 -Will stop Eliquis today and place on heparin gtt in preparation for LHC on 6/24 -Intolerance of beta blockers as above, she prefers to remain on verapamil (understands not ideal given her cardiomyopathy)  3. Elevated troponin/CAD as above: -Troponin minimally elevated and flat trending with a peak of  0.09 -New cardiomyopathy as above -Plan for LHC prior to discharge -Placed on heparin gtt as above  4. Anemia: -Stable  5. HTN: -BP stable  6. Recent GI bleed: -Felt  to be secondary to hemorrhoids -Anticoagulation resumed 03/26/2018  7. Hypokalemia: -Replete to goal > 4.0  8. AKI: -Stop IV Lasix -Resume PO Lasix following diuretic holiday    For questions or updates, please contact [facility] Please consult [email or web address] for contact info under Cardiology/STEMI.    Signed, Christell Faith, PA-C [place] Pager: [phone] 03/28/2018, 8:13 AM

## 2018-03-28 NOTE — Progress Notes (Signed)
ANTICOAGULATION CONSULT NOTE - Initial Consult  Pharmacy Consult for Heparin Indication: atrial fibrillation  Allergies  Allergen Reactions  . Beta Adrenergic Blockers Shortness Of Breath    DEPRESSION/HYPOTENSION  . Brilinta [Ticagrelor] Shortness Of Breath    Caused AFIB  . Albuterol     rigors  . Aspirin Other (See Comments)    Heartburn   . Nsaids Other (See Comments)    ULCER HISTORY & CURRENTLY ON BLOOD THINNERS  . Penicillins Hives and Other (See Comments)    Has patient had a PCN reaction causing immediate rash, facial/tongue/throat swelling, SOB or lightheadedness with hypotension: No Has patient had a PCN reaction causing severe rash involving mucus membranes or skin necrosis: No Has patient had a PCN reaction that required hospitalization No Has patient had a PCN reaction occurring within the last 10 years: No If all of the above answers are "NO", then may proceed with Cephalosporin use.   . Statins Other (See Comments)    Muscle weakness  . Sulfa Antibiotics Rash    At mouth  . Sulfasalazine Rash    At mouth  . Tetracycline Rash  . Tetracyclines & Related Rash    Patient Measurements: Height: 5\' 3"  (160 cm) Weight: 182 lb (82.6 kg) IBW/kg (Calculated) : 52.4 Heparin Dosing Weight: 70.6 kg  Vital Signs: Temp: 98.7 F (37.1 C) (06/21 1627) Temp Source: Oral (06/21 1627) BP: 113/69 (06/21 1627) Pulse Rate: 86 (06/21 1627)  Labs: Recent Labs    03/25/18 2034 03/26/18 0156 03/26/18 0730 03/26/18 1336 03/27/18 0431 03/28/18 0443 03/28/18 0841 03/28/18 1732  HGB 10.9*  --  10.2*  --   --   --  10.7*  --   HCT 32.1*  --  29.8*  --   --   --  30.8*  --   PLT 238  --  238  --   --   --  279  --   APTT  --   --   --   --   --   --  34 62*  LABPROT  --   --   --   --   --   --  16.5*  --   INR  --   --   --   --   --   --  1.34  --   HEPARINUNFRC  --   --   --   --   --   --  >3.60*  --   CREATININE 0.76  --  0.84  --  0.96 1.10*  --   --   TROPONINI  0.08* 0.09* 0.07* 0.07*  --   --   --   --     Estimated Creatinine Clearance: 42.2 mL/min (A) (by C-G formula based on SCr of 1.1 mg/dL (H)).   Assessment: 82 yo female on Eliquis for AFib to transition to heparin drip in prep for cath on Monday. Last dose of apixaban charted as 6/20 at 2141.  Goal of Therapy:  Heparin level 0.3-0.7 units/ml Monitor platelets by anticoagulation protocol: Yes   Plan:  APTT low at 62. Will bolus 1050 units and increase rate to 1150 units/hr. Recheck level in 8 hours.   Ramond Dial 03/28/2018,6:13 PM

## 2018-03-28 NOTE — Progress Notes (Signed)
Lab called reporting a heparin level of 4. RN paused the heparin and spoke with Nicki Reaper (Phamacist) and the MD.  Dr. Tressia Miners also confirmed it is okay to restart the heparin.  Patient's INR was < 2.  RN restarted heparin IV at 29mL/hr.  Phillis Knack, RN

## 2018-03-28 NOTE — Progress Notes (Signed)
[place] [facility] [place] NAME: Charlotte Wagner    MR#:  [id number]  DATE OF BIRTH:  03-Oct-1936  SUBJECTIVE:  CHIEF COMPLAINT:   Chief Complaint  Patient presents with  . Shortness of Breath   -Breathing is about the same.  Complains of significant weakness -Lasix held today.  Being started on heparin drip in anticipation of cardiac catheterization this Monday  REVIEW OF SYSTEMS:  Review of Systems  Constitutional: Negative for chills, fever and malaise/fatigue.  HENT: Negative for congestion, ear discharge, hearing loss and nosebleeds.   Eyes: Negative for blurred vision and double vision.  Respiratory: Positive for shortness of breath and wheezing. Negative for cough.   Cardiovascular: Negative for chest pain and palpitations.  Gastrointestinal: Negative for abdominal pain, constipation, diarrhea, nausea and vomiting.  Genitourinary: Negative for dysuria.  Musculoskeletal: Positive for back pain. Negative for myalgias.  Neurological: Negative for dizziness, focal weakness, seizures, weakness and headaches.  Psychiatric/Behavioral: Negative for depression.    DRUG ALLERGIES:   Allergies  Allergen Reactions  . Beta Adrenergic Blockers Shortness Of Breath    DEPRESSION/HYPOTENSION  . Brilinta [Ticagrelor] Shortness Of Breath    Caused AFIB  . Albuterol     rigors  . Aspirin Other (See Comments)    Heartburn   . Nsaids Other (See Comments)    ULCER HISTORY & CURRENTLY ON BLOOD THINNERS  . Penicillins Hives and Other (See Comments)    Has patient had a PCN reaction causing immediate rash, facial/tongue/throat swelling, SOB or lightheadedness with hypotension: No Has patient had a PCN reaction causing severe rash involving mucus membranes or skin necrosis: No Has patient had a PCN reaction that required hospitalization No Has patient had a PCN reaction occurring within the last 10 years: No If all of the above answers are "NO", then may  proceed with Cephalosporin use.   . Statins Other (See Comments)    Muscle weakness  . Sulfa Antibiotics Rash    At mouth  . Sulfasalazine Rash    At mouth  . Tetracycline Rash  . Tetracyclines & Related Rash    VITALS:  Blood pressure 108/72, pulse 78, temperature 97.7 F (36.5 C), temperature source Oral, resp. rate 14, height 5\' 3"  (1.6 m), weight 82.6 kg (182 lb), SpO2 93 %.  PHYSICAL EXAMINATION:  Physical Exam  GENERAL:  82 y.o.-year-old patient lying in the bed with no acute distress.  EYES: Pupils equal, round, reactive to light and accommodation. No scleral icterus. Extraocular muscles intact.  HEENT: Head atraumatic, normocephalic. Oropharynx and nasopharynx clear.  NECK:  Supple, no jugular venous distention. No thyroid enlargement, no tenderness.  LUNGS: Normal breath sounds bilaterally, no wheezing, rales,rhonchi or crepitation. No use of accessory muscles of respiration.  Diminished bibasilar breath sounds CARDIOVASCULAR: S1, S2 normal. No rubs, or gallops.  3/6 systolic murmur is present ABDOMEN: Soft, nontender, nondistended. Bowel sounds present. No organomegaly or mass.  EXTREMITIES: No cyanosis, or clubbing.  1+ pedal edema noted NEUROLOGIC: Cranial nerves II through XII are intact. Muscle strength 5/5 in all extremities. Sensation intact. Gait not checked.  PSYCHIATRIC: The patient is alert and oriented x 3.  SKIN: No obvious rash, lesion, or ulcer.    LABORATORY PANEL:   CBC Recent Labs  Lab 03/28/18 0841  WBC 6.7  HGB 10.7*  HCT 30.8*  PLT 279   ------------------------------------------------------------------------------------------------------------------  Chemistries  Recent Labs  Lab 03/21/18 1849  03/28/18 0443  NA 128*   < >  133*  K 4.1   < > 3.4*  CL 95*   < > 97*  CO2 23   < > 23  GLUCOSE 136*   < > 149*  BUN 17   < > 23*  CREATININE 0.95   < > 1.10*  CALCIUM 9.3   < > 8.6*  AST 27  --   --   ALT 17  --   --   ALKPHOS 59  --    --   BILITOT 1.4*  --   --    < > = values in this interval not displayed.   ------------------------------------------------------------------------------------------------------------------  Cardiac Enzymes Recent Labs  Lab 03/26/18 1336  TROPONINI 0.07*   ------------------------------------------------------------------------------------------------------------------  RADIOLOGY:  No results found.  EKG:   Orders placed or performed during the hospital encounter of 03/25/18  . EKG 12-Lead  . EKG 12-Lead  . EKG 12-Lead  . EKG 12-Lead  . ED EKG  . ED EKG    ASSESSMENT AND PLAN:   82 year old female with past medical history significant for CAD s/p CABG and PCI,  carotid artery disease, spinal stenosis, hypertension, history of permanent atrial fibrillation off of anticoagulation, polymyalgia rheumatica presents to hospital from [facility] independent living due to worsening shortness of breath  1.  Acute on chronic diastolic heart failure exacerbation-  - ECHO with EF dropped down to 30-35%.could be stress induced cardiomyopathy -on telemetry, received Lasix. On hold today as cr rising -Off oxygen  -Cardiology consult appreciated - on ramipril. On verapamil - as allergic to beta blockers  2. Ischemic cardiomyopathy-drop in fraction from normal to 30 to 35% noted on recent echo.  Also has significant anteroseptal wall motion abnormality. -Concern for acute coronary syndrome. -Eliquis held, started on heparin drip. -Possible cardiac catheterization on Monday  3.  Atrial fibrillation-on verapamil.  Restarted on eliquis this adm- on hold today as heparin drip started. Hb stable -Eliquis has been discontinued last month due to admission for GI bleed and acute anemia.  4.  GERD-Protonix  5.  Hyponatremia-secondary to CHF. Improved with lasix   6.  DVT prophylaxis- heparin drip  7. Recent E. coli bacteremia-continue Omnicef for now Probiotics added  PT consulted-  recommended HH   All the records are reviewed and case discussed with Care Management/Social Worker. Management plans discussed with the patient, family and they are in agreement.  CODE STATUS: Full code  TOTAL TIME TAKING CARE OF THIS PATIENT: 35 minutes.   POSSIBLE D/C IN 2-3  DAYS, DEPENDING ON CLINICAL CONDITION.   Gladstone Lighter M.D on 03/28/2018 at 11:08 AM  Between 7am to 6pm - Pager - [phone]  After 6pm go to [email or web address] - password [email or web address]  Office  [phone]  CC: Primary care physician; [name], MD

## 2018-03-28 NOTE — Care Management (Addendum)
Physical therapy has recommended home health physical therapy- which patient declined at last discharge.   She would benefit from home health nurse follow up for heart failure.  CM made attempt to speak directly with patient but she was not available.  she is currently on room air. [facility] referral. patient's Eliquis is on hold - heparin drip and heart cath pending.   9:57- Spoke directly to patient and she feel;s she needs to pursue to the health Care Building at [facility] at discharge.  Updated CSW

## 2018-03-28 NOTE — Clinical Social Work Note (Signed)
CSW spoke with patient and she stated she was wanting to go to SNF for short term rehab at [facility].  CSW contacted [facility], and since patient is a resident of the community they are able to accept her once she is medically ready for discharge and orders have been received.  CSW was informed that patient will be having a procedure on Monday, so she will not be ready for discharge till some time next week.  CSW to continue to follow patient's progress throughout discharge planning.  Jones Broom. Norval Morton, MSW, [place]  03/28/2018 4:46 PM

## 2018-03-28 NOTE — Clinical Social Work Note (Signed)
Clinical Social Work Assessment  Patient Details  Name: Charlotte Wagner, Dr. MRN: [id number] Date of Birth: 1936/03/14  Date of referral:  03/28/18               Reason for consult:  Facility Placement                Permission sought to share information with:  Family Supports, Customer service manager Permission granted to share information::  Yes, Verbal Permission Granted  Name::     [facility] Daughter [phone] or [place], Elza [phone]   Agency::  SNF admissions  Relationship::     Contact Information:     Housing/Transportation Living arrangements for the past 2 months:  Independent Living Facility([facility]) Source of Information:  Patient Patient Interpreter Needed:  None Criminal Activity/Legal Involvement Pertinent to Current Situation/Hospitalization:  No - Comment as needed Significant Relationships:  Adult Children Lives with:  Self, Facility Resident([facility]) Do you feel safe going back to the place where you live?  No Need for family participation in patient care:  No (Coment)  Care giving concerns:  Patient feels she needs short term rehab before she is able to return back home.   Social Worker assessment / plan:  Patient is a 82 year old female who is alert and oriented x4.  Patient is a resident at [facility].  Patient was explained role of CSW and process for looking for placement at [facility].  CSW explained how insurance will pay for stay and what to expect day of discharge.  Patient was originally recommended home health by PT, however, patient feels she needs rehab before she is able to return back home.  Patient did not express any other questions or concerns, and gave CSW permission to send clinicals to [facility].  Employment status:  Retired Forensic scientist:  Medicare PT Recommendations:  Home with [place] / Referral to community resources:  [place]  Patient/Family's Response to care:  Patient is in agreement to going to SNF at [facility].  Patient/Family's Understanding of and Emotional Response to Diagnosis, Current Treatment, and Prognosis:  Patient expressed she has her 82th birthday on July 1 and her family are coming in town.  She is hopeful she will be out of rehab before her birthday.  Emotional Assessment Appearance:  Appears stated age Attitude/Demeanor/Rapport:    Affect (typically observed):  Appropriate, Stable, Pleasant Orientation:  Oriented to Self, Oriented to Place, Oriented to  Time, Oriented to Situation Alcohol / Substance use:  Not Applicable Psych involvement (Current and /or in the community):  No (Comment)  Discharge Needs  Concerns to be addressed:  Care Coordination, Lack of Support Readmission within the last 30 days:  No Current discharge risk:  Lack of support system, Lives alone Barriers to Discharge:  Continued Medical Work up   Ross Ludwig, [place] 03/28/2018, 5:00 PM

## 2018-03-28 NOTE — Progress Notes (Signed)
Patient reported shortness of breath "extreme case" while walking to the bathroom.  Her O2 was 95%.  She asked for BP lying and standing.  BP standing was slightly higher than lying in bed.  Patient was satisfied with her vital signs and requested to be repositioned and her compression stockings (from home) removed.  Stockings are tied to the end of her bed.  There is edema in her toes.  Yellow safety socks were put on patient and then RN tucked her into her blankets.  Phillis Knack, RN

## 2018-03-28 NOTE — Progress Notes (Signed)
ANTICOAGULATION CONSULT NOTE - Initial Consult  Pharmacy Consult for Heparin Indication: atrial fibrillation  Allergies  Allergen Reactions  . Beta Adrenergic Blockers Shortness Of Breath    DEPRESSION/HYPOTENSION  . Brilinta [Ticagrelor] Shortness Of Breath    Caused AFIB  . Albuterol     rigors  . Aspirin Other (See Comments)    Heartburn   . Nsaids Other (See Comments)    ULCER HISTORY & CURRENTLY ON BLOOD THINNERS  . Penicillins Hives and Other (See Comments)    Has patient had a PCN reaction causing immediate rash, facial/tongue/throat swelling, SOB or lightheadedness with hypotension: No Has patient had a PCN reaction causing severe rash involving mucus membranes or skin necrosis: No Has patient had a PCN reaction that required hospitalization No Has patient had a PCN reaction occurring within the last 10 years: No If all of the above answers are "NO", then may proceed with Cephalosporin use.   . Statins Other (See Comments)    Muscle weakness  . Sulfa Antibiotics Rash    At mouth  . Sulfasalazine Rash    At mouth  . Tetracycline Rash  . Tetracyclines & Related Rash    Patient Measurements: Height: 5\' 3"  (160 cm) Weight: 182 lb (82.6 kg) IBW/kg (Calculated) : 52.4 Heparin Dosing Weight: 70.6 kg  Vital Signs: Temp: 97.7 F (36.5 C) (06/21 0733) Temp Source: Oral (06/21 0733) BP: 108/72 (06/21 0733) Pulse Rate: 78 (06/21 0733)  Labs: Recent Labs    03/25/18 2034 03/26/18 0156 03/26/18 0730 03/26/18 1336 03/27/18 0431 03/28/18 0443  HGB 10.9*  --  10.2*  --   --   --   HCT 32.1*  --  29.8*  --   --   --   PLT 238  --  238  --   --   --   CREATININE 0.76  --  0.84  --  0.96 1.10*  TROPONINI 0.08* 0.09* 0.07* 0.07*  --   --     Estimated Creatinine Clearance: 42.2 mL/min (A) (by C-G formula based on SCr of 1.1 mg/dL (H)).   Assessment: 82 yo female on Eliquis for AFib to transition to heparin drip in prep for cath on Monday. Last dose of apixaban  charted as 6/20 at 2141.  Goal of Therapy:  Heparin level 0.3-0.7 units/ml Monitor platelets by anticoagulation protocol: Yes   Plan:  Baseline labs ordered stat Will start heparin drip without bolus (transitioning from apixaban) at 1000 units/hr (=10 ml/hr) Will check aPTT in ~8h. HL and CBC in AM.    Charlotte Wagner L 03/28/2018,8:45 AM

## 2018-03-29 LAB — RETICULOCYTES
RBC.: 2.99 MIL/uL — AB (ref 3.80–5.20)
RETIC CT PCT: 2.1 % (ref 0.4–3.1)
Retic Count, Absolute: 62.8 10*3/uL (ref 19.0–183.0)

## 2018-03-29 LAB — CBC
HCT: 29.3 % — ABNORMAL LOW (ref 35.0–47.0)
Hemoglobin: 10.1 g/dL — ABNORMAL LOW (ref 12.0–16.0)
MCH: 33.6 pg (ref 26.0–34.0)
MCHC: 34.3 g/dL (ref 32.0–36.0)
MCV: 98 fL (ref 80.0–100.0)
Platelets: 246 10*3/uL (ref 150–440)
RBC: 2.99 MIL/uL — ABNORMAL LOW (ref 3.80–5.20)
RDW: 16.9 % — AB (ref 11.5–14.5)
WBC: 5.9 10*3/uL (ref 3.6–11.0)

## 2018-03-29 LAB — BASIC METABOLIC PANEL
Anion gap: 10 (ref 5–15)
BUN: 20 mg/dL (ref 6–20)
CO2: 25 mmol/L (ref 22–32)
CREATININE: 0.84 mg/dL (ref 0.44–1.00)
Calcium: 8.7 mg/dL — ABNORMAL LOW (ref 8.9–10.3)
Chloride: 97 mmol/L — ABNORMAL LOW (ref 101–111)
GFR calc Af Amer: >60 mL/min (ref >60)
GLUCOSE: 127 mg/dL — AB (ref 65–99)
Potassium: 3.2 mmol/L — ABNORMAL LOW (ref 3.5–5.1)
Sodium: 132 mmol/L — ABNORMAL LOW (ref 135–145)

## 2018-03-29 LAB — IRON AND TIBC
Iron: 47 ug/dL (ref 28–170)
SATURATION RATIOS: 14 % (ref 10.4–31.8)
TIBC: 337 ug/dL (ref 250–450)
UIBC: 290 ug/dL

## 2018-03-29 LAB — VITAMIN B12: Vitamin B-12: 869 pg/mL (ref 180–914)

## 2018-03-29 LAB — HEPARIN LEVEL (UNFRACTIONATED): HEPARIN UNFRACTIONATED: 2.66 [IU]/mL — AB (ref 0.30–0.70)

## 2018-03-29 LAB — FERRITIN: FERRITIN: 29 ng/mL (ref 11–307)

## 2018-03-29 LAB — FOLATE: Folate: 39 ng/mL (ref >5.9)

## 2018-03-29 LAB — APTT: APTT: 87 s — AB (ref 24–36)

## 2018-03-29 MED ORDER — FUROSEMIDE 40 MG PO TABS
40.0000 mg | ORAL_TABLET | Freq: Every day | ORAL | Status: DC
  Administered 2018-03-29 – 2018-03-31 (×3): 40 mg via ORAL
  Filled 2018-03-29 (×4): qty 1

## 2018-03-29 MED ORDER — SODIUM CHLORIDE 0.9 % IV SOLN: INTRAVENOUS | Status: DC

## 2018-03-29 MED ORDER — POTASSIUM CHLORIDE CRYS ER 20 MEQ PO TBCR
40.0000 meq | EXTENDED_RELEASE_TABLET | Freq: Once | ORAL | Status: AC
  Administered 2018-03-29: 40 meq via ORAL
  Filled 2018-03-29: qty 2

## 2018-03-29 MED ORDER — SODIUM CHLORIDE 0.9% FLUSH: 3.0000 mL | INTRAVENOUS | Status: DC | PRN

## 2018-03-29 MED ORDER — POTASSIUM CHLORIDE CRYS ER 20 MEQ PO TBCR
20.0000 meq | EXTENDED_RELEASE_TABLET | Freq: Every day | ORAL | Status: DC
  Administered 2018-03-29 – 2018-04-01 (×4): 20 meq via ORAL
  Filled 2018-03-29 (×5): qty 1

## 2018-03-29 MED ORDER — SODIUM CHLORIDE 0.9 % IV SOLN: 250.0000 mL | INTRAVENOUS | Status: DC | PRN

## 2018-03-29 MED ORDER — SODIUM CHLORIDE 0.9% FLUSH
3.0000 mL | Freq: Two times a day (BID) | INTRAVENOUS | Status: DC
  Administered 2018-03-31: 3 mL via INTRAVENOUS

## 2018-03-29 NOTE — Plan of Care (Signed)
  Problem: Education: Goal: Ability to demonstrate management of disease process will improve Outcome: Progressing Goal: Ability to verbalize understanding of medication therapies will improve Outcome: Progressing   

## 2018-03-29 NOTE — Progress Notes (Addendum)
ANTICOAGULATION CONSULT NOTE - Initial Consult  Pharmacy Consult for Heparin Indication: atrial fibrillation  Allergies  Allergen Reactions  . Beta Adrenergic Blockers Shortness Of Breath    DEPRESSION/HYPOTENSION  . Brilinta [Ticagrelor] Shortness Of Breath    Caused AFIB  . Albuterol     rigors  . Aspirin Other (See Comments)    Heartburn   . Nsaids Other (See Comments)    ULCER HISTORY & CURRENTLY ON BLOOD THINNERS  . Penicillins Hives and Other (See Comments)    Has patient had a PCN reaction causing immediate rash, facial/tongue/throat swelling, SOB or lightheadedness with hypotension: No Has patient had a PCN reaction causing severe rash involving mucus membranes or skin necrosis: No Has patient had a PCN reaction that required hospitalization No Has patient had a PCN reaction occurring within the last 10 years: No If all of the above answers are "NO", then may proceed with Cephalosporin use.   . Statins Other (See Comments)    Muscle weakness  . Sulfa Antibiotics Rash    At mouth  . Sulfasalazine Rash    At mouth  . Tetracycline Rash  . Tetracyclines & Related Rash    Patient Measurements: Height: 5\' 3"  (160 cm) Weight: 182 lb (82.6 kg) IBW/kg (Calculated) : 52.4 Heparin Dosing Weight: 70.6 kg  Vital Signs: Temp: 98.2 F (36.8 C) (06/21 1941) Temp Source: Oral (06/21 1941) BP: 116/72 (06/21 1941) Pulse Rate: 81 (06/21 1941)  Labs: Recent Labs    03/26/18 0730 03/26/18 1336 03/27/18 0431 03/28/18 0443 03/28/18 0841 03/28/18 1732 03/29/18 0233  HGB 10.2*  --   --   --  10.7*  --  10.1*  HCT 29.8*  --   --   --  30.8*  --  29.3*  PLT 238  --   --   --  279  --  246  APTT  --   --   --   --  34 62* 87*  LABPROT  --   --   --   --  16.5*  --   --   INR  --   --   --   --  1.34  --   --   HEPARINUNFRC  --   --   --   --  >3.60*  --   --   CREATININE 0.84  --  0.96 1.10*  --   --   --   TROPONINI 0.07* 0.07*  --   --   --   --   --     Estimated  Creatinine Clearance: 42.2 mL/min (A) (by C-G formula based on SCr of 1.1 mg/dL (H)).   Assessment: 82 yo female on Eliquis for AFib to transition to heparin drip in prep for cath on Monday. Last dose of apixaban charted as 6/20 at 2141.  Goal of Therapy:  Heparin level 0.3-0.7 units/ml Monitor platelets by anticoagulation protocol: Yes   Plan:  06/22 @ 0230 aPTT 87 seconds therapeutic. HL 2.66 will continue to dose off aPTT for now and continue current rate, will recheck aPTT w/ am labs. CBC stable.  Tobie Lords, PharmD, BCPS Clinical Pharmacist 03/29/2018

## 2018-03-29 NOTE — Progress Notes (Signed)
[place] at [place] NAME: Charlotte Wagner    MR#:  [id number]  DATE OF BIRTH:  1936/01/09  SUBJECTIVE:  CHIEF COMPLAINT:   Chief Complaint  Patient presents with  . Shortness of Breath   -Episode of dyspnea last evening, episode of chest pain last night relieved with nitro -Lasix restarted by cardiology this morning.  Continue on heparin drip -Plan for cardiac catheterization on Monday  REVIEW OF SYSTEMS:  Review of Systems  Constitutional: Negative for chills, fever and malaise/fatigue.  HENT: Negative for congestion, ear discharge, hearing loss and nosebleeds.   Eyes: Negative for blurred vision and double vision.  Respiratory: Positive for shortness of breath and wheezing. Negative for cough.   Cardiovascular: Negative for chest pain and palpitations.  Gastrointestinal: Negative for abdominal pain, constipation, diarrhea, nausea and vomiting.  Genitourinary: Negative for dysuria.  Musculoskeletal: Positive for back pain. Negative for myalgias.  Neurological: Negative for dizziness, focal weakness, seizures, weakness and headaches.  Psychiatric/Behavioral: Negative for depression.    DRUG ALLERGIES:   Allergies  Allergen Reactions  . Beta Adrenergic Blockers Shortness Of Breath    DEPRESSION/HYPOTENSION  . Brilinta [Ticagrelor] Shortness Of Breath    Caused AFIB  . Albuterol     rigors  . Aspirin Other (See Comments)    Heartburn   . Nsaids Other (See Comments)    ULCER HISTORY & CURRENTLY ON BLOOD THINNERS  . Penicillins Hives and Other (See Comments)    Has patient had a PCN reaction causing immediate rash, facial/tongue/throat swelling, SOB or lightheadedness with hypotension: No Has patient had a PCN reaction causing severe rash involving mucus membranes or skin necrosis: No Has patient had a PCN reaction that required hospitalization No Has patient had a PCN reaction occurring within the last 10 years: No If all of the  above answers are "NO", then may proceed with Cephalosporin use.   . Statins Other (See Comments)    Muscle weakness  . Sulfa Antibiotics Rash    At mouth  . Sulfasalazine Rash    At mouth  . Tetracycline Rash  . Tetracyclines & Related Rash    VITALS:  Blood pressure 125/77, pulse 89, temperature 98 F (36.7 C), temperature source Oral, resp. rate 18, height 5\' 3"  (1.6 m), weight 82.6 kg (182 lb), SpO2 97 %.  PHYSICAL EXAMINATION:  Physical Exam  GENERAL:  82 y.o.-year-old patient lying in the bed with no acute distress.  EYES: Pupils equal, round, reactive to light and accommodation. No scleral icterus. Extraocular muscles intact.  HEENT: Head atraumatic, normocephalic. Oropharynx and nasopharynx clear.  NECK:  Supple, no jugular venous distention. No thyroid enlargement, no tenderness.  LUNGS: Normal breath sounds bilaterally, no wheezing, rales,rhonchi or crepitation. No use of accessory muscles of respiration.  Diminished bibasilar breath sounds CARDIOVASCULAR: S1, S2 normal. No rubs, or gallops.  3/6 systolic murmur is present ABDOMEN: Soft, nontender, nondistended. Bowel sounds present. No organomegaly or mass.  EXTREMITIES: No cyanosis, or clubbing.  1+ pedal edema noted NEUROLOGIC: Cranial nerves II through XII are intact. Muscle strength 5/5 in all extremities. Sensation intact. Gait not checked.  PSYCHIATRIC: The patient is alert and oriented x 3.  SKIN: No obvious rash, lesion, or ulcer.    LABORATORY PANEL:   CBC Recent Labs  Lab 03/29/18 0233  WBC 5.9  HGB 10.1*  HCT 29.3*  PLT 246   ------------------------------------------------------------------------------------------------------------------  Chemistries  Recent Labs  Lab 03/29/18 0233  NA 132*  K 3.2*  CL 97*  CO2 25  GLUCOSE 127*  BUN 20  CREATININE 0.84  CALCIUM 8.7*    ------------------------------------------------------------------------------------------------------------------  Cardiac Enzymes Recent Labs  Lab 03/26/18 1336  TROPONINI 0.07*   ------------------------------------------------------------------------------------------------------------------  RADIOLOGY:  No results found.  EKG:   Orders placed or performed during the hospital encounter of 03/25/18  . EKG 12-Lead  . EKG 12-Lead  . EKG 12-Lead  . EKG 12-Lead  . ED EKG  . ED EKG    ASSESSMENT AND PLAN:   82 year old female with past medical history significant for CAD s/p CABG and PCI,  carotid artery disease, spinal stenosis, hypertension, history of permanent atrial fibrillation off of anticoagulation, polymyalgia rheumatica presents to hospital from [facility] independent living due to worsening shortness of breath  1.  Acute on chronic diastolic heart failure exacerbation-  - ECHO with EF dropped down to 30-35%.could be stress induced cardiomyopathy -on telemetry, received iv lasix- changed to oral daily today -Off oxygen  -Cardiology consult appreciated - on ramipril. On verapamil - as allergic to beta blockers  2. Ischemic cardiomyopathy-drop in fraction from normal to 30 to 35% noted on recent echo.  Also has significant anteroseptal wall motion abnormality. -Concern for acute coronary syndrome. -Eliquis held, on heparin drip. -Possible cardiac catheterization on Monday  3.  Atrial fibrillation-on verapamil.  Restarted on eliquis this adm- on hold now as heparin drip started. Hb stable -Eliquis has been discontinued last month due to admission for GI bleed and acute anemia.  4.  GERD-Protonix  5.  Hyponatremia-secondary to CHF. Improved with lasix   6.  DVT prophylaxis- heparin drip  7. Recent E. coli bacteremia-continue Omnicef for now Probiotics added  PT consulted- recommended HH   All the records are reviewed and case discussed with Care  Management/Social Worker. Management plans discussed with the patient, family and they are in agreement.  CODE STATUS: Full code  TOTAL TIME TAKING CARE OF THIS PATIENT: 35 minutes.   POSSIBLE D/C IN 2  DAYS, DEPENDING ON CLINICAL CONDITION.   Gladstone Lighter M.D on 03/29/2018 at 11:34 AM  Between 7am to 6pm - Pager - [phone]  After 6pm go to [email or web address] - password [facility]  Office  [phone]  CC: Primary care physician; [name], MD

## 2018-03-29 NOTE — Progress Notes (Addendum)
Progress Note  Patient Name: Charlotte Wagner, Dr. Date of Encounter: 03/29/2018  Primary Cardiologist: Rockey Situ  Subjective   More SOB this morning. Episode of chest pain overnight that required SL NTG x 2. Currently chest pain free. SCr has improved to 0.84 from 1.10 following holding of IV Lasix on 6/21. Potassium low at 3.2 this morning. Documented UOP of 651 mL for the past 24 hours and a net - 5.8 L for the admission. No weight this morning. HGB stable at 10.1. Remains on heparin gtt. BP well controlled. Back pain slightly improved.   Inpatient Medications    Scheduled Meds: . acidophilus  2 capsule Oral TID  . amitriptyline  25 mg Oral QHS  . cefdinir  300 mg Oral BID  . ezetimibe  10 mg Oral Daily  . ferrous sulfate  325 mg Oral BID WC  . mometasone-formoterol  2 puff Inhalation BID  . nitroGLYCERIN      . pantoprazole  40 mg Oral BID  . polyethylene glycol  17 g Oral Daily  . potassium chloride  40 mEq Oral Once  . ramipril  5 mg Oral Daily  . sodium chloride flush  3 mL Intravenous Q12H  . spironolactone  25 mg Oral q morning - 10a  . traZODone  250 mg Oral QHS  . verapamil  180 mg Oral BID   Continuous Infusions: . heparin 1,150 Units/hr (03/29/18 0615)   PRN Meds: acetaminophen **OR** acetaminophen, nitroGLYCERIN, ondansetron **OR** ondansetron (ZOFRAN) IV   Vital Signs    Vitals:   03/28/18 1627 03/28/18 1941 03/29/18 0406 03/29/18 0744  BP: 113/69 116/72 122/72 125/77  Pulse: 86 81 84 89  Resp: 16 18 20 18   Temp: 98.7 F (37.1 C) 98.2 F (36.8 C) 97.9 F (36.6 C) 98 F (36.7 C)  TempSrc: Oral Oral Oral Oral  SpO2: 97% 96% 93% 97%  Weight:      Height:        Intake/Output Summary (Last 24 hours) at 03/29/2018 0758 Last data filed at 03/29/2018 0454 Gross per 24 hour  Intake 649 ml  Output 1000 ml  Net -351 ml   Filed Weights   03/26/18 0109 03/27/18 0402 03/28/18 0341  Weight: 188 lb (85.3 kg) 183 lb 4.8 oz (83.1 kg) 182 lb (82.6 kg)     Telemetry    Afib, 70s to 80s bpm - Personally Reviewed  ECG    n/a - Personally Reviewed  Physical Exam   GEN: No acute distress.   Neck: JVD elevated ~ 8 cm. Cardiac: Irregularly irregular, II/VI systolic murmur LUSB, no rubs, or gallops.  Respiratory: Faint crackles at the bilateral bases.  GI: Soft, nontender, non-distended.   MS: Trace bilateral pre-tibial edema; No deformity. Neuro:  Alert and oriented x 3; Nonfocal.  Psych: Normal affect.  Labs    Chemistry Recent Labs  Lab 03/27/18 0431 03/28/18 0443 03/29/18 0233  NA 132* 133* 132*  K 3.6 3.4* 3.2*  CL 101 97* 97*  CO2 23 23 25   GLUCOSE 126* 149* 127*  BUN 19 23* 20  CREATININE 0.96 1.10* 0.84  CALCIUM 8.9 8.6* 8.7*  GFRNONAA 55* 46* >60  GFRAA >60 54* >60  ANIONGAP 8 13 10      Hematology Recent Labs  Lab 03/26/18 0730 03/28/18 0841 03/29/18 0233  WBC 4.7 6.7 5.9  RBC 3.05* 3.14* 2.99*  2.99*  HGB 10.2* 10.7* 10.1*  HCT 29.8* 30.8* 29.3*  MCV 97.5 97.9 98.0  MCH  33.5 34.0 33.6  MCHC 34.3 34.7 34.3  RDW 16.7* 16.9* 16.9*  PLT 238 279 246    Cardiac Enzymes Recent Labs  Lab 03/25/18 2034 03/26/18 0156 03/26/18 0730 03/26/18 1336  TROPONINI 0.08* 0.09* 0.07* 0.07*   No results for input(s): TROPIPOC in the last 168 hours.   BNP Recent Labs  Lab 03/25/18 2034  BNP 1,912.0*     DDimer No results for input(s): DDIMER in the last 168 hours.   Radiology    No results found.  Cardiac Studies   TTE 03/26/2018: Study Conclusions  - Left ventricle: The cavity size was normal. Systolic function was moderately to severely reduced. The estimated ejection fraction was in the range of 30% to 35%. Hypokinesis of the anteroseptal myocardium. Hypokinesis of the anterior myocardium. Basal wall motion best preserved. Hypokinesis of the apical myocardium. - Mitral valve: There was mild regurgitation. - Left atrium: The atrium was mildly to moderately dilated. - Right  ventricle: The cavity size was moderately dilated. Wall thickness was normal. Systolic function was moderately to severely reduced. - Right atrium: The atrium was mildly dilated. - Atrial septum: There was a patent foramen ovale. - Tricuspid valve: There was moderate-severe regurgitation. - Pulmonary arteries: Systolic pressure was moderate to severely elevated PA peak pressure: 58 mm Hg (S).  Impressions:  - rhythm is atrial fibrillation.  Patient Profile     82 y.o. female with history of CAD s/p 4-vessel CABG in 1998 s/p PCI to the SVG-RCA in 05/2016, persistent Afib previously on Eliquis, chronic diastolic CHF, HTN, HLD, GI bleed in 02/2018 who was admitted with acute on chronic diastolic CHF.  Assessment & Plan    1. Acute systolic CHF/acute on chronic diastolic CHF/pulmonary hypertension: -Good diuresis this admission, now appears mostly euvolemic with bump in BUN/SCr -IV Lasix held on 6/21 given AKI -More SOB and slightly volume up this morning -Resume PO Lasix 40 mg daily with KCl repletion  -Will need LHC prior to discharge, scheduled for 6/24 -Would ideally like to stop verapamil given her new cardiomyopathy, though she is intolerant of beta blockers secondary to SOB, hypotension, bradycardia, and depression -Continue ramipril5mg  daily (PTA 10 mg daily) -Continue spironolactone   2. Persistent Afib: -Ventricular rates well controlled as above -DOAC held 02/2018 in the setting of GI bleed, restarted 6/19 -Eliquis held on 6/21 in preparation for LHC on 6/24, started on heparin gtt 6/21 -Will resume DOAC following LHC when able -Intolerance of beta blockersas above, she prefers to remain on verapamil (understands not ideal given her cardiomyopathy)  3. Elevated troponin/CAD as above: -Troponin minimally elevated and flat trending with a peak of 0.09 -New cardiomyopathy as above -Plan for [facility] 6/24 -Placed on heparin gtt as above  4. Anemia: -Stable  5.  HTN: -BP stable  6. Recent GI bleed: -Felt to be secondary to hemorrhoids -Anticoagulation resumed 03/26/2018  7. Hypokalemia: -Replete to goal > 4.0  8. AKI: -IV Lasix held on 6/21 -Start PO Lasix 40 mg daily with KCl repletion    For questions or updates, please contact [place] Please consult [email or web address] for contact info under Cardiology/STEMI.    Signed, Christell Faith, PA-C [facility] Pager: [phone] 03/29/2018, 7:58 AM   I have seen, examined the patient, and reviewed the above assessment and plan.  Changes to above are made where necessary.  On exam, iRRR.  Decreased BS at bases.  Speaks in short breathless sentences.  Replete K and continue gentle diuresis.  Planned for  cath on Monday.  Co Sign: Thompson Grayer, MD 03/29/2018 3:45 PM

## 2018-03-30 LAB — CBC
HCT: 30 % — ABNORMAL LOW (ref 35.0–47.0)
Hemoglobin: 10.2 g/dL — ABNORMAL LOW (ref 12.0–16.0)
MCH: 33.5 pg (ref 26.0–34.0)
MCHC: 34 g/dL (ref 32.0–36.0)
MCV: 98.6 fL (ref 80.0–100.0)
PLATELETS: 252 10*3/uL (ref 150–440)
RBC: 3.04 MIL/uL — AB (ref 3.80–5.20)
RDW: 16.7 % — AB (ref 11.5–14.5)
WBC: 4.5 10*3/uL (ref 3.6–11.0)

## 2018-03-30 LAB — BASIC METABOLIC PANEL
Anion gap: 9 (ref 5–15)
BUN: 19 mg/dL (ref 6–20)
CALCIUM: 8.9 mg/dL (ref 8.9–10.3)
CHLORIDE: 100 mmol/L — AB (ref 101–111)
CO2: 23 mmol/L (ref 22–32)
CREATININE: 0.91 mg/dL (ref 0.44–1.00)
GFR calc non Af Amer: 58 mL/min — ABNORMAL LOW (ref >60)
GLUCOSE: 125 mg/dL — AB (ref 65–99)
Potassium: 3.9 mmol/L (ref 3.5–5.1)
Sodium: 132 mmol/L — ABNORMAL LOW (ref 135–145)

## 2018-03-30 LAB — APTT: aPTT: 108 seconds — ABNORMAL HIGH (ref 24–36)

## 2018-03-30 LAB — TSH: TSH: 2.962 u[IU]/mL (ref 0.350–4.500)

## 2018-03-30 LAB — MAGNESIUM: Magnesium: 2.1 mg/dL (ref 1.7–2.4)

## 2018-03-30 LAB — HEPARIN LEVEL (UNFRACTIONATED): Heparin Unfractionated: 1.71 IU/mL — ABNORMAL HIGH (ref 0.30–0.70)

## 2018-03-30 MED ORDER — TRAMADOL HCL 50 MG PO TABS
50.0000 mg | ORAL_TABLET | Freq: Every evening | ORAL | Status: DC | PRN
  Administered 2018-03-30 – 2018-03-31 (×2): 50 mg via ORAL
  Filled 2018-03-30 (×2): qty 1

## 2018-03-30 MED ORDER — BISACODYL 10 MG RE SUPP
10.0000 mg | Freq: Once | RECTAL | Status: AC
  Administered 2018-03-30: 10 mg via RECTAL
  Filled 2018-03-30: qty 1

## 2018-03-30 MED ORDER — POTASSIUM CHLORIDE CRYS ER 10 MEQ PO TBCR
10.0000 meq | EXTENDED_RELEASE_TABLET | Freq: Once | ORAL | Status: AC
Start: 2018-03-30 — End: 2018-03-30
  Administered 2018-03-30: 10 meq via ORAL
  Filled 2018-03-30: qty 1

## 2018-03-30 NOTE — Progress Notes (Signed)
Notified MD of pt requesting medication for legs. Orders placed. Will continue to monitor and assess.

## 2018-03-30 NOTE — H&P (View-Only) (Signed)
Progress Note  Patient Name: Charlotte Wagner, Dr. Date of Encounter: 03/30/2018  Primary Cardiologist: Rockey Situ  Subjective   She did not sleep well last night. Cramps in her legs. No chest pain or increased SOB. Renal function stable at 0.84-->0.91. Potassium improved to 3.9. HGB stable at 10.2. BP well controlled. Documented UOP of 1.3 L for the past 24 hours following resumption of PO Lasix 40 mg daily on 6/22 with a net - of 7.1 L for the admission. Weight 182 pounds which is stable when compared to 6/21. For [place] on 6/24. Remains on heparin gtt (Eliquis held 6/21).   Inpatient Medications    Scheduled Meds: . acidophilus  2 capsule Oral TID  . amitriptyline  25 mg Oral QHS  . cefdinir  300 mg Oral BID  . ezetimibe  10 mg Oral Daily  . ferrous sulfate  325 mg Oral BID WC  . furosemide  40 mg Oral Daily  . mometasone-formoterol  2 puff Inhalation BID  . pantoprazole  40 mg Oral BID  . polyethylene glycol  17 g Oral Daily  . potassium chloride  20 mEq Oral Daily  . ramipril  5 mg Oral Daily  . sodium chloride flush  3 mL Intravenous Q12H  . sodium chloride flush  3 mL Intravenous Q12H  . spironolactone  25 mg Oral q morning - 10a  . traZODone  250 mg Oral QHS  . verapamil  180 mg Oral BID   Continuous Infusions: . sodium chloride    . sodium chloride    . heparin 1,150 Units/hr (03/29/18 0615)   PRN Meds: sodium chloride, acetaminophen **OR** acetaminophen, nitroGLYCERIN, ondansetron **OR** ondansetron (ZOFRAN) IV, sodium chloride flush, traMADol   Vital Signs    Vitals:   03/29/18 0744 03/29/18 1627 03/29/18 1926 03/30/18 0330  BP: 125/77 111/72 (!) 116/59 119/64  Pulse: 89 76 81 80  Resp: 18 16 18 18   Temp: 98 F (36.7 C) 97.8 F (36.6 C) 97.9 F (36.6 C) 98 F (36.7 C)  TempSrc: Oral Oral Oral Oral  SpO2: 97% 95% 97% 95%  Weight:    182 lb 1.6 oz (82.6 kg)  Height:        Intake/Output Summary (Last 24 hours) at 03/30/2018 0726 Last data filed at  03/30/2018 0647 Gross per 24 hour  Intake 886 ml  Output 2200 ml  Net -1314 ml   Filed Weights   03/27/18 0402 03/28/18 0341 03/30/18 0330  Weight: 183 lb 4.8 oz (83.1 kg) 182 lb (82.6 kg) 182 lb 1.6 oz (82.6 kg)    Telemetry    Afib, 60s bpm, 9 beats NSVT vs Afib with aberrancy  - Personally Reviewed  ECG    n/a - Personally Reviewed  Physical Exam   GEN: No acute distress.   Neck: No JVD. Cardiac: Irregularly irregular, II/VI systolic murmur LUSB, no rubs, or gallops.  Respiratory: Improved breath sounds bilaterally.  GI: Soft, nontender, non-distended.   MS: Trace bilateral pre-tibial edema; No deformity. Neuro:  Alert and oriented x 3; Nonfocal.  Psych: Normal affect.  Labs    Chemistry Recent Labs  Lab 03/28/18 0443 03/29/18 0233 03/30/18 0401  NA 133* 132* 132*  K 3.4* 3.2* 3.9  CL 97* 97* 100*  CO2 23 25 23   GLUCOSE 149* 127* 125*  BUN 23* 20 19  CREATININE 1.10* 0.84 0.91  CALCIUM 8.6* 8.7* 8.9  GFRNONAA 46* >60 58*  GFRAA 54* >60 >60  ANIONGAP 13 10  9     Hematology Recent Labs  Lab 03/28/18 0841 03/29/18 0233 03/30/18 0401  WBC 6.7 5.9 4.5  RBC 3.14* 2.99*  2.99* 3.04*  HGB 10.7* 10.1* 10.2*  HCT 30.8* 29.3* 30.0*  MCV 97.9 98.0 98.6  MCH 34.0 33.6 33.5  MCHC 34.7 34.3 34.0  RDW 16.9* 16.9* 16.7*  PLT 279 246 252    Cardiac Enzymes Recent Labs  Lab 03/25/18 2034 03/26/18 0156 03/26/18 0730 03/26/18 1336  TROPONINI 0.08* 0.09* 0.07* 0.07*   No results for input(s): TROPIPOC in the last 168 hours.   BNP Recent Labs  Lab 03/25/18 2034  BNP 1,912.0*     DDimer No results for input(s): DDIMER in the last 168 hours.   Radiology    No results found.  Cardiac Studies   TTE 03/26/2018: Study Conclusions  - Left ventricle: The cavity size was normal. Systolic function was moderately to severely reduced. The estimated ejection fraction was in the range of 30% to 35%. Hypokinesis of the anteroseptal myocardium.  Hypokinesis of the anterior myocardium. Basal wall motion best preserved. Hypokinesis of the apical myocardium. - Mitral valve: There was mild regurgitation. - Left atrium: The atrium was mildly to moderately dilated. - Right ventricle: The cavity size was moderately dilated. Wall thickness was normal. Systolic function was moderately to severely reduced. - Right atrium: The atrium was mildly dilated. - Atrial septum: There was a patent foramen ovale. - Tricuspid valve: There was moderate-severe regurgitation. - Pulmonary arteries: Systolic pressure was moderate to severely elevated PA peak pressure: 58 mm Hg (S).  Impressions:  - rhythm is atrial fibrillation.  Patient Profile     82 y.o. female with history of CAD s/p 4-vessel CABG in 1998 s/p PCI to the SVG-RCA in 05/2016, persistent Afib previously on Eliquis, chronic diastolic CHF, HTN, HLD, GI bleed in 02/2018 who was admitted with acute on chronic diastolic CHF.  Assessment & Plan    1. Acute systolic CHF/acute on chronic diastolic CHF/pulmonary hypertension: -Good diuresis this admission, now appears mostly euvolemic with bump in BUN/SCr -IV Lasix held on 6/21 given AKI -More SOB and slightly volume up this morning -Continue PO Lasix 40 mg daily with KCl repletion  -Planning for [facility] 6/24, orders placed (cath lab aware) -Would ideally like to stop verapamil given her new cardiomyopathy, though she is intolerant of beta blockers secondary to SOB, hypotension, bradycardia, and depression -Continueramipril5mg  daily(PTA 10 mg daily) -Continue spironolactone   2. Persistent Afib: -Ventricular rates well controlled as above -DOAC held 02/2018 in the setting of GI bleed, restarted 6/19 -Eliquis held on 6/21 in preparation for LHC on 6/24, started on heparin gtt 6/21 -Will resume DOAC following LHC when able -Intolerance of beta blockersas above, she prefers to remain on verapamil (understands not ideal given her  cardiomyopathy)  3. Elevated troponin/CAD as above: -Troponin minimally elevated and flat trending with a peak of 0.09 -New cardiomyopathy as above -Plan for [facility] 6/24 -Placed on heparin gtt as above -NPO at midnight  -Risks and benefits of cardiac catheterization have been discussed with the patient including risks of bleeding, bruising, infection, kidney damage, stroke, heart attack, and death. The patient understands these risks and is willing to proceed with the procedure. All questions have been answered and concerns listened to  4. Anemia: -Stable  5. HTN: -BP stable  6. Recent GI bleed: -Felt to be secondary to hemorrhoids -Anticoagulation resumed 03/26/2018 -Stable HGB as above  7. Hypokalemia: -Replete to goal > 4.0  8. AKI: -Improved -IV Lasix held on 6/21 -Continue PO Lasix 40 mg daily with KCl repletion   9. Possible NSVT vs Afib with aberrancy: -Asymptomatic  -Potassium 3.9, replete to goal > 4.0 -Check magnesium and TSH with recommendation to replete magnesium to goal > 2.0 as indicated  -Intolerant of beta blockers as above -No indication for amiodarone at this time -Monitor   10. Cramps: -Check magnesium as above -Replete potassium as above  For questions or updates, please contact [place] Please consult [email or web address] for contact info under Cardiology/STEMI.    Signed, Christell Faith, PA-C [facility] Pager: [phone] 03/30/2018, 7:26 AM   I have seen, examined the patient, and reviewed the above assessment and plan.  Changes to above are made where necessary.  On exam, iRRR.  Remains SOB, though improved.  Will proceed with cath tomorrow.  Co Sign: Thompson Grayer, MD 03/30/2018 1:44 PM

## 2018-03-30 NOTE — Progress Notes (Addendum)
Progress Note  Patient Name: Charlotte Wagner, Dr. Date of Encounter: 03/30/2018  Primary Cardiologist: Rockey Situ  Subjective   She did not sleep well last night. Cramps in her legs. No chest pain or increased SOB. Renal function stable at 0.84-->0.91. Potassium improved to 3.9. HGB stable at 10.2. BP well controlled. Documented UOP of 1.3 L for the past 24 hours following resumption of PO Lasix 40 mg daily on 6/22 with a net - of 7.1 L for the admission. Weight 182 pounds which is stable when compared to 6/21. For [place] on 6/24. Remains on heparin gtt (Eliquis held 6/21).   Inpatient Medications    Scheduled Meds: . acidophilus  2 capsule Oral TID  . amitriptyline  25 mg Oral QHS  . cefdinir  300 mg Oral BID  . ezetimibe  10 mg Oral Daily  . ferrous sulfate  325 mg Oral BID WC  . furosemide  40 mg Oral Daily  . mometasone-formoterol  2 puff Inhalation BID  . pantoprazole  40 mg Oral BID  . polyethylene glycol  17 g Oral Daily  . potassium chloride  20 mEq Oral Daily  . ramipril  5 mg Oral Daily  . sodium chloride flush  3 mL Intravenous Q12H  . sodium chloride flush  3 mL Intravenous Q12H  . spironolactone  25 mg Oral q morning - 10a  . traZODone  250 mg Oral QHS  . verapamil  180 mg Oral BID   Continuous Infusions: . sodium chloride    . sodium chloride    . heparin 1,150 Units/hr (03/29/18 0615)   PRN Meds: sodium chloride, acetaminophen **OR** acetaminophen, nitroGLYCERIN, ondansetron **OR** ondansetron (ZOFRAN) IV, sodium chloride flush, traMADol   Vital Signs    Vitals:   03/29/18 0744 03/29/18 1627 03/29/18 1926 03/30/18 0330  BP: 125/77 111/72 (!) 116/59 119/64  Pulse: 89 76 81 80  Resp: 18 16 18 18   Temp: 98 F (36.7 C) 97.8 F (36.6 C) 97.9 F (36.6 C) 98 F (36.7 C)  TempSrc: Oral Oral Oral Oral  SpO2: 97% 95% 97% 95%  Weight:    182 lb 1.6 oz (82.6 kg)  Height:        Intake/Output Summary (Last 24 hours) at 03/30/2018 0726 Last data filed at  03/30/2018 0647 Gross per 24 hour  Intake 886 ml  Output 2200 ml  Net -1314 ml   Filed Weights   03/27/18 0402 03/28/18 0341 03/30/18 0330  Weight: 183 lb 4.8 oz (83.1 kg) 182 lb (82.6 kg) 182 lb 1.6 oz (82.6 kg)    Telemetry    Afib, 60s bpm, 9 beats NSVT vs Afib with aberrancy  - Personally Reviewed  ECG    n/a - Personally Reviewed  Physical Exam   GEN: No acute distress.   Neck: No JVD. Cardiac: Irregularly irregular, II/VI systolic murmur LUSB, no rubs, or gallops.  Respiratory: Improved breath sounds bilaterally.  GI: Soft, nontender, non-distended.   MS: Trace bilateral pre-tibial edema; No deformity. Neuro:  Alert and oriented x 3; Nonfocal.  Psych: Normal affect.  Labs    Chemistry Recent Labs  Lab 03/28/18 0443 03/29/18 0233 03/30/18 0401  NA 133* 132* 132*  K 3.4* 3.2* 3.9  CL 97* 97* 100*  CO2 23 25 23   GLUCOSE 149* 127* 125*  BUN 23* 20 19  CREATININE 1.10* 0.84 0.91  CALCIUM 8.6* 8.7* 8.9  GFRNONAA 46* >60 58*  GFRAA 54* >60 >60  ANIONGAP 13 10  9     Hematology Recent Labs  Lab 03/28/18 0841 03/29/18 0233 03/30/18 0401  WBC 6.7 5.9 4.5  RBC 3.14* 2.99*  2.99* 3.04*  HGB 10.7* 10.1* 10.2*  HCT 30.8* 29.3* 30.0*  MCV 97.9 98.0 98.6  MCH 34.0 33.6 33.5  MCHC 34.7 34.3 34.0  RDW 16.9* 16.9* 16.7*  PLT 279 246 252    Cardiac Enzymes Recent Labs  Lab 03/25/18 2034 03/26/18 0156 03/26/18 0730 03/26/18 1336  TROPONINI 0.08* 0.09* 0.07* 0.07*   No results for input(s): TROPIPOC in the last 168 hours.   BNP Recent Labs  Lab 03/25/18 2034  BNP 1,912.0*     DDimer No results for input(s): DDIMER in the last 168 hours.   Radiology    No results found.  Cardiac Studies   TTE 03/26/2018: Study Conclusions  - Left ventricle: The cavity size was normal. Systolic function was moderately to severely reduced. The estimated ejection fraction was in the range of 30% to 35%. Hypokinesis of the anteroseptal myocardium.  Hypokinesis of the anterior myocardium. Basal wall motion best preserved. Hypokinesis of the apical myocardium. - Mitral valve: There was mild regurgitation. - Left atrium: The atrium was mildly to moderately dilated. - Right ventricle: The cavity size was moderately dilated. Wall thickness was normal. Systolic function was moderately to severely reduced. - Right atrium: The atrium was mildly dilated. - Atrial septum: There was a patent foramen ovale. - Tricuspid valve: There was moderate-severe regurgitation. - Pulmonary arteries: Systolic pressure was moderate to severely elevated PA peak pressure: 58 mm Hg (S).  Impressions:  - rhythm is atrial fibrillation.  Patient Profile     82 y.o. female with history of CAD s/p 4-vessel CABG in 1998 s/p PCI to the SVG-RCA in 05/2016, persistent Afib previously on Eliquis, chronic diastolic CHF, HTN, HLD, GI bleed in 02/2018 who was admitted with acute on chronic diastolic CHF.  Assessment & Plan    1. Acute systolic CHF/acute on chronic diastolic CHF/pulmonary hypertension: -Good diuresis this admission, now appears mostly euvolemic with bump in BUN/SCr -IV Lasix held on 6/21 given AKI -More SOB and slightly volume up this morning -Continue PO Lasix 40 mg daily with KCl repletion  -Planning for [facility] 6/24, orders placed (cath lab aware) -Would ideally like to stop verapamil given her new cardiomyopathy, though she is intolerant of beta blockers secondary to SOB, hypotension, bradycardia, and depression -Continueramipril5mg  daily(PTA 10 mg daily) -Continue spironolactone   2. Persistent Afib: -Ventricular rates well controlled as above -DOAC held 02/2018 in the setting of GI bleed, restarted 6/19 -Eliquis held on 6/21 in preparation for LHC on 6/24, started on heparin gtt 6/21 -Will resume DOAC following LHC when able -Intolerance of beta blockersas above, she prefers to remain on verapamil (understands not ideal given her  cardiomyopathy)  3. Elevated troponin/CAD as above: -Troponin minimally elevated and flat trending with a peak of 0.09 -New cardiomyopathy as above -Plan for [facility] 6/24 -Placed on heparin gtt as above -NPO at midnight  -Risks and benefits of cardiac catheterization have been discussed with the patient including risks of bleeding, bruising, infection, kidney damage, stroke, heart attack, and death. The patient understands these risks and is willing to proceed with the procedure. All questions have been answered and concerns listened to  4. Anemia: -Stable  5. HTN: -BP stable  6. Recent GI bleed: -Felt to be secondary to hemorrhoids -Anticoagulation resumed 03/26/2018 -Stable HGB as above  7. Hypokalemia: -Replete to goal > 4.0  8. AKI: -Improved -IV Lasix held on 6/21 -Continue PO Lasix 40 mg daily with KCl repletion   9. Possible NSVT vs Afib with aberrancy: -Asymptomatic  -Potassium 3.9, replete to goal > 4.0 -Check magnesium and TSH with recommendation to replete magnesium to goal > 2.0 as indicated  -Intolerant of beta blockers as above -No indication for amiodarone at this time -Monitor   10. Cramps: -Check magnesium as above -Replete potassium as above  For questions or updates, please contact [place] Please consult [email or web address] for contact info under Cardiology/STEMI.    Signed, Christell Faith, PA-C [facility] Pager: [phone] 03/30/2018, 7:26 AM   I have seen, examined the patient, and reviewed the above assessment and plan.  Changes to above are made where necessary.  On exam, iRRR.  Remains SOB, though improved.  Will proceed with cath tomorrow.  Co Sign: Thompson Grayer, MD 03/30/2018 1:44 PM

## 2018-03-30 NOTE — Plan of Care (Signed)
  Problem: Safety: Goal: Ability to remain free from injury will improve Outcome: Progressing   

## 2018-03-30 NOTE — Progress Notes (Signed)
[place] [facility] [place] NAME: Charlotte Wagner    MR#:  [id number]  DATE OF BIRTH:  06/08/1936  SUBJECTIVE:  CHIEF COMPLAINT:   Chief Complaint  Patient presents with  . Shortness of Breath   -complains of restless legs last night.  Much better today.  Relieved with tramadol. -Remains on heparin drip.  For cardiac cath tomorrow  REVIEW OF SYSTEMS:  Review of Systems  Constitutional: Negative for chills, fever and malaise/fatigue.  HENT: Negative for congestion, ear discharge, hearing loss and nosebleeds.   Eyes: Negative for blurred vision and double vision.  Respiratory: Positive for shortness of breath and wheezing. Negative for cough.   Cardiovascular: Negative for chest pain and palpitations.  Gastrointestinal: Negative for abdominal pain, constipation, diarrhea, nausea and vomiting.  Genitourinary: Negative for dysuria.  Musculoskeletal: Positive for back pain. Negative for myalgias.  Neurological: Negative for dizziness, focal weakness, seizures, weakness and headaches.  Psychiatric/Behavioral: Negative for depression.    DRUG ALLERGIES:   Allergies  Allergen Reactions  . Beta Adrenergic Blockers Shortness Of Breath    DEPRESSION/HYPOTENSION  . Brilinta [Ticagrelor] Shortness Of Breath    Caused AFIB  . Albuterol     rigors  . Aspirin Other (See Comments)    Heartburn   . Nsaids Other (See Comments)    ULCER HISTORY & CURRENTLY ON BLOOD THINNERS  . Penicillins Hives and Other (See Comments)    Has patient had a PCN reaction causing immediate rash, facial/tongue/throat swelling, SOB or lightheadedness with hypotension: No Has patient had a PCN reaction causing severe rash involving mucus membranes or skin necrosis: No Has patient had a PCN reaction that required hospitalization No Has patient had a PCN reaction occurring within the last 10 years: No If all of the above answers are "NO", then may proceed with Cephalosporin  use.   . Statins Other (See Comments)    Muscle weakness  . Sulfa Antibiotics Rash    At mouth  . Sulfasalazine Rash    At mouth  . Tetracycline Rash  . Tetracyclines & Related Rash    VITALS:  Blood pressure 115/80, pulse 68, temperature (!) 97.3 F (36.3 C), temperature source Oral, resp. rate 16, height 5\' 3"  (1.6 m), weight 82.6 kg (182 lb 1.6 oz), SpO2 94 %.  PHYSICAL EXAMINATION:  Physical Exam  GENERAL:  82 y.o.-year-old patient lying in the bed with no acute distress.  EYES: Pupils equal, round, reactive to light and accommodation. No scleral icterus. Extraocular muscles intact.  HEENT: Head atraumatic, normocephalic. Oropharynx and nasopharynx clear.  NECK:  Supple, no jugular venous distention. No thyroid enlargement, no tenderness.  LUNGS: Normal breath sounds bilaterally, no wheezing, rales,rhonchi or crepitation. No use of accessory muscles of respiration.  Diminished bibasilar breath sounds CARDIOVASCULAR: S1, S2 normal. No rubs, or gallops.  3/6 systolic murmur is present ABDOMEN: Soft, nontender, nondistended. Bowel sounds present. No organomegaly or mass.  EXTREMITIES: No cyanosis, or clubbing.  no pedal edema noted NEUROLOGIC: Cranial nerves II through XII are intact. Muscle strength 5/5 in all extremities. Sensation intact. Gait not checked.  PSYCHIATRIC: The patient is alert and oriented x 3.  SKIN: No obvious rash, lesion, or ulcer.    LABORATORY PANEL:   CBC Recent Labs  Lab 03/30/18 0401  WBC 4.5  HGB 10.2*  HCT 30.0*  PLT 252   ------------------------------------------------------------------------------------------------------------------  Chemistries  Recent Labs  Lab 03/30/18 0401  NA 132*  K 3.9  CL 100*  CO2 23  GLUCOSE 125*  BUN 19  CREATININE 0.91  CALCIUM 8.9  MG 2.1   ------------------------------------------------------------------------------------------------------------------  Cardiac Enzymes Recent Labs  Lab  03/26/18 1336  TROPONINI 0.07*   ------------------------------------------------------------------------------------------------------------------  RADIOLOGY:  No results found.  EKG:   Orders placed or performed during the hospital encounter of 03/25/18  . EKG 12-Lead  . EKG 12-Lead  . EKG 12-Lead  . EKG 12-Lead  . ED EKG  . ED EKG    ASSESSMENT AND PLAN:   82 year old female with past medical history significant for CAD s/p CABG and PCI,  carotid artery disease, spinal stenosis, hypertension, history of permanent atrial fibrillation off of anticoagulation, polymyalgia rheumatica presents to hospital from [facility] independent living due to worsening shortness of breath  1.  Acute on chronic diastolic heart failure exacerbation-  - ECHO with EF dropped down to 30-35%. -on telemetry, off iv lasix- changed to oral daily now -Off oxygen  -Cardiology consult appreciated - on ramipril. On verapamil - as allergic to beta blockers  2. Ischemic cardiomyopathy-drop in fraction from normal to 30 to 35% noted on recent echo.  Also has significant anteroseptal wall motion abnormality. -Concern for acute coronary syndrome. -Eliquis held, on heparin drip. -for  cardiac catheterization on Monday  3.  Atrial fibrillation-on verapamil.  Restarted on eliquis this adm- on hold now as heparin drip started. Hb stable -Eliquis has been discontinued last month due to admission for GI bleed and acute anemia.  4.  GERD-Protonix  5.  Hyponatremia-secondary to CHF. Improved with lasix   6.  DVT prophylaxis- heparin drip  7. Recent E. coli bacteremia-continue Omnicef for now for a total 14 day trt Probiotics added  PT consulted- recommended [place]- patient will go to [place]   All the records are reviewed and case discussed with Care Management/Social Worker. Management plans discussed with the patient, family and they are in agreement.  CODE STATUS: Full code  TOTAL TIME TAKING CARE OF  THIS PATIENT: 37 minutes.   POSSIBLE D/C IN 2  DAYS, DEPENDING ON CLINICAL CONDITION.   Gladstone Lighter M.D on 03/30/2018 at 12:07 PM  Between 7am to 6pm - Pager - [phone]  After 6pm go to [email or web address] - password [email or web address]  Office  [phone]  CC: Primary care physician; [name], MD

## 2018-03-30 NOTE — Progress Notes (Signed)
ANTICOAGULATION CONSULT NOTE - Initial Consult  Pharmacy Consult for Heparin Indication: atrial fibrillation  Allergies  Allergen Reactions  . Beta Adrenergic Blockers Shortness Of Breath    DEPRESSION/HYPOTENSION  . Brilinta [Ticagrelor] Shortness Of Breath    Caused AFIB  . Albuterol     rigors  . Aspirin Other (See Comments)    Heartburn   . Nsaids Other (See Comments)    ULCER HISTORY & CURRENTLY ON BLOOD THINNERS  . Penicillins Hives and Other (See Comments)    Has patient had a PCN reaction causing immediate rash, facial/tongue/throat swelling, SOB or lightheadedness with hypotension: No Has patient had a PCN reaction causing severe rash involving mucus membranes or skin necrosis: No Has patient had a PCN reaction that required hospitalization No Has patient had a PCN reaction occurring within the last 10 years: No If all of the above answers are "NO", then may proceed with Cephalosporin use.   . Statins Other (See Comments)    Muscle weakness  . Sulfa Antibiotics Rash    At mouth  . Sulfasalazine Rash    At mouth  . Tetracycline Rash  . Tetracyclines & Related Rash    Patient Measurements: Height: 5\' 3"  (160 cm) Weight: 182 lb (82.6 kg) IBW/kg (Calculated) : 52.4 Heparin Dosing Weight: 70.6 kg  Vital Signs: Temp: 98 F (36.7 C) (06/23 0330) Temp Source: Oral (06/23 0330) BP: 119/64 (06/23 0330) Pulse Rate: 80 (06/23 0330)  Labs: Recent Labs    03/28/18 0443  03/28/18 0841 03/28/18 1732 03/29/18 0233 03/30/18 0401  HGB  --    < > 10.7*  --  10.1* 10.2*  HCT  --   --  30.8*  --  29.3* 30.0*  PLT  --   --  279  --  246 252  APTT  --    < > 34 62* 87* 108*  LABPROT  --   --  16.5*  --   --   --   INR  --   --  1.34  --   --   --   HEPARINUNFRC  --   --  >3.60*  --  2.66* 1.71*  CREATININE 1.10*  --   --   --  0.84 0.91   < > = values in this interval not displayed.    Estimated Creatinine Clearance: 51 mL/min (by C-G formula based on SCr of 0.91  mg/dL).   Assessment: 82 yo female on Eliquis for AFib to transition to heparin drip in prep for cath on Monday. Last dose of apixaban charted as 6/20 at 2141.  Goal of Therapy:  Heparin level 0.3-0.7 units/ml Monitor platelets by anticoagulation protocol: Yes   Plan:  06/22 @ 0230 aPTT 87 seconds therapeutic. HL 2.66 will continue to dose off aPTT for now and continue current rate, will recheck aPTT w/ am labs. CBC stable.  06/23  @ 0400:   APTT = 108,  HL = 1.71 Will continue this pt on current rate and recheck aPTT and HL on 6/24 with AM labs.   Saraland D Clinical Pharmacist 03/30/2018

## 2018-03-31 ENCOUNTER — Encounter
Admission: EM | Disposition: A | Discharge status: Home Health | Payer: Self-pay | Source: Home / Self Care | Attending: Internal Medicine

## 2018-03-31 DIAGNOSIS — I25110 Atherosclerotic heart disease of native coronary artery with unstable angina pectoris: Secondary | ICD-10-CM

## 2018-03-31 HISTORY — PX: LEFT HEART CATH AND CORONARY ANGIOGRAPHY: CATH118249

## 2018-03-31 HISTORY — PX: CORONARY STENT INTERVENTION: CATH118234

## 2018-03-31 LAB — CBC
HCT: 30.3 % — ABNORMAL LOW (ref 35.0–47.0)
Hemoglobin: 10.3 g/dL — ABNORMAL LOW (ref 12.0–16.0)
MCH: 33.7 pg (ref 26.0–34.0)
MCHC: 34 g/dL (ref 32.0–36.0)
MCV: 99.3 fL (ref 80.0–100.0)
Platelets: 246 10*3/uL (ref 150–440)
RBC: 3.05 MIL/uL — ABNORMAL LOW (ref 3.80–5.20)
RDW: 16.7 % — ABNORMAL HIGH (ref 11.5–14.5)
WBC: 4.3 10*3/uL (ref 3.6–11.0)

## 2018-03-31 LAB — BASIC METABOLIC PANEL
Anion gap: 9 (ref 5–15)
BUN: 19 mg/dL (ref 6–20)
CO2: 23 mmol/L (ref 22–32)
CREATININE: 1.06 mg/dL — AB (ref 0.44–1.00)
Calcium: 8.6 mg/dL — ABNORMAL LOW (ref 8.9–10.3)
Chloride: 97 mmol/L — ABNORMAL LOW (ref 101–111)
GFR calc Af Amer: 56 mL/min — ABNORMAL LOW (ref >60)
GFR calc non Af Amer: 49 mL/min — ABNORMAL LOW (ref >60)
Glucose, Bld: 116 mg/dL — ABNORMAL HIGH (ref 65–99)
Potassium: 4.2 mmol/L (ref 3.5–5.1)
SODIUM: 129 mmol/L — AB (ref 135–145)

## 2018-03-31 LAB — APTT: APTT: 99 s — AB (ref 24–36)

## 2018-03-31 LAB — HEPARIN LEVEL (UNFRACTIONATED): Heparin Unfractionated: 1.05 IU/mL — ABNORMAL HIGH (ref 0.30–0.70)

## 2018-03-31 LAB — POCT ACTIVATED CLOTTING TIME: ACTIVATED CLOTTING TIME: 252 s

## 2018-03-31 SURGERY — LEFT HEART CATH AND CORONARY ANGIOGRAPHY
Anesthesia: Moderate Sedation

## 2018-03-31 MED ORDER — FUROSEMIDE 40 MG PO TABS: 40.0000 mg | ORAL_TABLET | Freq: Two times a day (BID) | ORAL | Status: DC

## 2018-03-31 MED ORDER — SODIUM CHLORIDE 0.9 % IV SOLN: 250.0000 mL | INTRAVENOUS | Status: DC | PRN

## 2018-03-31 MED ORDER — BIVALIRUDIN TRIFLUOROACETATE 250 MG IV SOLR
0.2500 mg/kg/h | INTRAVENOUS | Status: AC
  Filled 2018-03-31 (×2): qty 250

## 2018-03-31 MED ORDER — FENTANYL CITRATE (PF) 100 MCG/2ML IJ SOLN
INTRAMUSCULAR | Status: DC | PRN
  Administered 2018-03-31: 25 ug via INTRAVENOUS

## 2018-03-31 MED ORDER — FENTANYL CITRATE (PF) 100 MCG/2ML IJ SOLN
INTRAMUSCULAR | Status: AC
  Filled 2018-03-31: qty 2

## 2018-03-31 MED ORDER — BIVALIRUDIN BOLUS VIA INFUSION - CUPID
INTRAVENOUS | Status: DC | PRN
  Administered 2018-03-31: 63.75 mg via INTRAVENOUS

## 2018-03-31 MED ORDER — LIDOCAINE HCL (PF) 1 % IJ SOLN
INTRAMUSCULAR | Status: AC
  Filled 2018-03-31: qty 30

## 2018-03-31 MED ORDER — NITROGLYCERIN 5 MG/ML IV SOLN
INTRAVENOUS | Status: AC
  Filled 2018-03-31: qty 10

## 2018-03-31 MED ORDER — HEPARIN SODIUM (PORCINE) 1000 UNIT/ML IJ SOLN
INTRAMUSCULAR | Status: AC
  Filled 2018-03-31: qty 1

## 2018-03-31 MED ORDER — BIVALIRUDIN TRIFLUOROACETATE 250 MG IV SOLR
INTRAVENOUS | Status: AC
  Filled 2018-03-31: qty 250

## 2018-03-31 MED ORDER — CLOPIDOGREL BISULFATE 75 MG PO TABS
ORAL_TABLET | ORAL | Status: AC
  Filled 2018-03-31: qty 8

## 2018-03-31 MED ORDER — SODIUM CHLORIDE 0.9% FLUSH: 3.0000 mL | Freq: Two times a day (BID) | INTRAVENOUS | Status: DC

## 2018-03-31 MED ORDER — MIDAZOLAM HCL 2 MG/2ML IJ SOLN
INTRAMUSCULAR | Status: AC
  Filled 2018-03-31: qty 2

## 2018-03-31 MED ORDER — IOPAMIDOL (ISOVUE-300) INJECTION 61%
INTRAVENOUS | Status: DC | PRN
  Administered 2018-03-31: 140 mL via INTRA_ARTERIAL

## 2018-03-31 MED ORDER — HEPARIN (PORCINE) IN NACL 1000-0.9 UT/500ML-% IV SOLN
INTRAVENOUS | Status: AC
  Filled 2018-03-31: qty 1000

## 2018-03-31 MED ORDER — SODIUM CHLORIDE 0.9 % IV SOLN
INTRAVENOUS | Status: AC | PRN
  Administered 2018-03-31: 1.75 mg/kg/h via INTRAVENOUS

## 2018-03-31 MED ORDER — CLOPIDOGREL BISULFATE 75 MG PO TABS
75.0000 mg | ORAL_TABLET | Freq: Every day | ORAL | Status: DC
  Administered 2018-04-01: 75 mg via ORAL
  Filled 2018-03-31: qty 1

## 2018-03-31 MED ORDER — SODIUM CHLORIDE 0.9 % WEIGHT BASED INFUSION
1.0000 mL/kg/h | INTRAVENOUS | Status: AC
  Administered 2018-03-31: 1 mL/kg/h via INTRAVENOUS

## 2018-03-31 MED ORDER — VERAPAMIL HCL 2.5 MG/ML IV SOLN
INTRAVENOUS | Status: AC
  Filled 2018-03-31: qty 2

## 2018-03-31 MED ORDER — FUROSEMIDE 40 MG PO TABS
40.0000 mg | ORAL_TABLET | Freq: Every day | ORAL | Status: DC
  Administered 2018-03-31 – 2018-04-01 (×2): 40 mg via ORAL
  Filled 2018-03-31: qty 1

## 2018-03-31 MED ORDER — SODIUM CHLORIDE 0.9% FLUSH: 3.0000 mL | INTRAVENOUS | Status: DC | PRN

## 2018-03-31 MED ORDER — MIDAZOLAM HCL 2 MG/2ML IJ SOLN
INTRAMUSCULAR | Status: DC | PRN
  Administered 2018-03-31: 1 mg via INTRAVENOUS

## 2018-03-31 SURGICAL SUPPLY — 20 items
CATH INFINITI 5 FR IM (CATHETERS) ×3 IMPLANT
CATH INFINITI 5FR ANG PIGTAIL (CATHETERS) ×3 IMPLANT
CATH INFINITI 5FR JL4 (CATHETERS) ×3 IMPLANT
CATH INFINITI JR4 5F (CATHETERS) ×3 IMPLANT
CATH VISTA GUIDE 6FR LCB (CATHETERS) ×3 IMPLANT
DEVICE CLOSURE MYNXGRIP 6/7F (Vascular Products) ×3 IMPLANT
DEVICE INFLAT 30 PLUS (MISCELLANEOUS) ×3 IMPLANT
DEVICE SPIDERFX EMB PROT 5MM (WIRE) ×3 IMPLANT
GUIDEWIRE 3MM J TIP .035 145 (WIRE) ×3 IMPLANT
KIT MANI 3VAL PERCEP (MISCELLANEOUS) ×3 IMPLANT
NEEDLE PERC 18GX7CM (NEEDLE) ×3 IMPLANT
NEEDLE PERC 21GX4CM (NEEDLE) ×3 IMPLANT
PACK CARDIAC CATH (CUSTOM PROCEDURE TRAY) ×3 IMPLANT
SHEATH AVANTI 5FR X 11CM (SHEATH) ×3 IMPLANT
SHEATH AVANTI 6FR X 11CM (SHEATH) ×3 IMPLANT
SHEATH RAIN RADIAL 21G 6FR (SHEATH) ×3 IMPLANT
STENT RESOLUTE ONYX 4.0X15 (Permanent Stent) ×3 IMPLANT
WIRE EMERALD 3MM-J .035X260CM (WIRE) ×3 IMPLANT
WIRE INTUITION PROPEL ST 180CM (WIRE) ×3 IMPLANT
WIRE ROSEN-J .035X260CM (WIRE) ×3 IMPLANT

## 2018-03-31 NOTE — Plan of Care (Signed)
  Problem: Cardiac: Goal: Ability to achieve and maintain adequate cardiopulmonary perfusion will improve Outcome: Progressing   

## 2018-03-31 NOTE — Care Management Important Message (Signed)
Copy of signed IM left with patient in room.  

## 2018-03-31 NOTE — Progress Notes (Signed)
[name] at [place] NAME: Charlotte Wagner    MR#:  [id number]  DATE OF BIRTH:  10-19-1935  SUBJECTIVE:   Patient doing ok this am some with some SOB this am No chest pain  REVIEW OF SYSTEMS:    Review of Systems  Constitutional: Negative for fever, chills weight loss HENT: Negative for ear pain, nosebleeds, congestion, facial swelling, rhinorrhea, neck pain, neck stiffness and ear discharge.   Respiratory: Negative for cough, ++ mild shortness of breath, NO wheezing  Cardiovascular: Negative for chest pain, palpitations and leg swelling.  Gastrointestinal: Negative for heartburn, abdominal pain, vomiting, diarrhea or consitpation Genitourinary: Negative for dysuria, urgency, frequency, hematuria Musculoskeletal: Negative for back pain or joint pain Neurological: Negative for dizziness, seizures, syncope, focal weakness,  numbness and headaches.  Hematological: Does not bruise/bleed easily.  Psychiatric/Behavioral: Negative for hallucinations, confusion, dysphoric mood    Tolerating Diet: npo      DRUG ALLERGIES:   Allergies  Allergen Reactions  . Beta Adrenergic Blockers Shortness Of Breath    DEPRESSION/HYPOTENSION  . Brilinta [Ticagrelor] Shortness Of Breath    Caused AFIB  . Albuterol     rigors  . Aspirin Other (See Comments)    Heartburn   . Nsaids Other (See Comments)    ULCER HISTORY & CURRENTLY ON BLOOD THINNERS  . Penicillins Hives and Other (See Comments)    Has patient had a PCN reaction causing immediate rash, facial/tongue/throat swelling, SOB or lightheadedness with hypotension: No Has patient had a PCN reaction causing severe rash involving mucus membranes or skin necrosis: No Has patient had a PCN reaction that required hospitalization No Has patient had a PCN reaction occurring within the last 10 years: No If all of the above answers are "NO", then may proceed with Cephalosporin use.   . Statins Other (See  Comments)    Muscle weakness  . Sulfa Antibiotics Rash    At mouth  . Sulfasalazine Rash    At mouth  . Tetracycline Rash  . Tetracyclines & Related Rash    VITALS:  Blood pressure (!) 129/97, pulse 96, temperature 97.7 F (36.5 C), temperature source Oral, resp. rate 14, height 5\' 3"  (1.6 m), weight 85 kg (187 lb 8 oz), SpO2 95 %.  PHYSICAL EXAMINATION:  Constitutional: Appears well-developed and well-nourished. No distress. HENT: Normocephalic. [name] Oropharynx is clear and moist.  Eyes: Conjunctivae and EOM are normal. PERRLA, no scleral icterus.  Neck: Normal ROM. Neck supple. No JVD. No tracheal deviation. CVS: RRR, S1/S2 +, no murmurs, no gallops, no carotid bruit.  Pulmonary: Effort and breath sounds normal, no stridor, rhonchi, wheezes, rales.  Abdominal: Soft. BS +,  no distension, tenderness, rebound or guarding.  Musculoskeletal: Normal range of motion. No edema and no tenderness.  Neuro: Alert. CN 2-12 grossly intact. No focal deficits. Skin: Skin is warm and dry. No rash noted. Psychiatric: Normal mood and affect.      LABORATORY PANEL:   CBC Recent Labs  Lab 03/31/18 0428  WBC 4.3  HGB 10.3*  HCT 30.3*  PLT 246   ------------------------------------------------------------------------------------------------------------------  Chemistries  Recent Labs  Lab 03/30/18 0401 03/31/18 0428  NA 132* 129*  K 3.9 4.2  CL 100* 97*  CO2 23 23  GLUCOSE 125* 116*  BUN 19 19  CREATININE 0.91 1.06*  CALCIUM 8.9 8.6*  MG 2.1  --    ------------------------------------------------------------------------------------------------------------------  Cardiac Enzymes Recent Labs  Lab 03/26/18 0156 03/26/18 0730 03/26/18 1336  TROPONINI 0.09* 0.07* 0.07*   ------------------------------------------------------------------------------------------------------------------  RADIOLOGY:  No results found.   ASSESSMENT AND PLAN:   82 year old female with  history of CAD status post CABG and PCI, essential hypertension and permanent atrial fibrillation who presented due to shortness of breath.  1.  Acute systolic heart failure and acute on  chronic diastolic heart failure with pulmonary hypertension: Continue Lasix, ACE inhibitor , Aldactone  monitor intake and output with daily weight. CHF clinic upon discharge  2.  Persistent atrial fibrillation with heart rate controlled Eliquis on hold due to cardiac catheterization Continue verapamil for heart rate control  3.  New cardiomyopathy EF 30 to 35% with elevated troponin: Plan for cardiac catheterization today  4.  Recent E. coli bacteremia: Continue Omnicef  5.  Hyponatremia due to congestive heart failure which improved with Lasix  Physical therapy is recommending home with home health once stable for discharge Management plans discussed with the patient and she is in agreement.  CODE STATUS: full  TOTAL TIME TAKING CARE OF THIS PATIENT: 22 minutes.     POSSIBLE D/C tomorrow, DEPENDING ON CLINICAL CONDITION.   [name] M.D on 03/31/2018 at 11:50 AM  Between 7am to 6pm - Pager - [phone] After 6pm go to [email or web address] - password [email or web address] [facility]  Office  [phone]  CC: Primary care physician; [name], MD  Note: This dictation was prepared with Dragon dictation along with smaller phrase technology. Any transcriptional errors that result from this process are unintentional.

## 2018-03-31 NOTE — Progress Notes (Addendum)
Progress Note  Patient Name: Charlotte Wagner, Dr. Date of Encounter: 03/31/2018  Primary Cardiologist: Ida Rogue, MD  Subjective   No chest pain. Feels like breathing is getting worse.  Wt rising since coming off of IV lasix.  On for cath this afternoon.  Inpatient Medications    Scheduled Meds: . acidophilus  2 capsule Oral TID  . amitriptyline  25 mg Oral QHS  . cefdinir  300 mg Oral BID  . ezetimibe  10 mg Oral Daily  . ferrous sulfate  325 mg Oral BID WC  . furosemide  40 mg Oral Daily  . mometasone-formoterol  2 puff Inhalation BID  . pantoprazole  40 mg Oral BID  . polyethylene glycol  17 g Oral Daily  . potassium chloride  20 mEq Oral Daily  . ramipril  5 mg Oral Daily  . sodium chloride flush  3 mL Intravenous Q12H  . sodium chloride flush  3 mL Intravenous Q12H  . spironolactone  25 mg Oral q morning - 10a  . traZODone  250 mg Oral QHS  . verapamil  180 mg Oral BID   Continuous Infusions: . sodium chloride    . sodium chloride    . heparin 1,150 Units/hr (03/31/18 0256)   PRN Meds: sodium chloride, acetaminophen **OR** acetaminophen, nitroGLYCERIN, ondansetron **OR** ondansetron (ZOFRAN) IV, sodium chloride flush, traMADol   Vital Signs    Vitals:   03/30/18 1744 03/30/18 2046 03/31/18 0505 03/31/18 0834  BP: 112/75 118/70 109/74 (!) 129/97  Pulse: 75 91 77 96  Resp: 14   14  Temp: 98.7 F (37.1 C) (!) 97.4 F (36.3 C) 97.6 F (36.4 C) 97.7 F (36.5 C)  TempSrc: Oral Oral Oral Oral  SpO2: 95% 94% 95% 95%  Weight:   187 lb 8 oz (85 kg)   Height:        Intake/Output Summary (Last 24 hours) at 03/31/2018 1041 Last data filed at 03/31/2018 0918 Gross per 24 hour  Intake 332 ml  Output 800 ml  Net -468 ml   Filed Weights   03/28/18 0341 03/30/18 0330 03/31/18 0505  Weight: 182 lb (82.6 kg) 182 lb 1.6 oz (82.6 kg) 187 lb 8 oz (85 kg)    Physical Exam   GEN: Well nourished, well developed, in no acute distress.  HEENT: Grossly normal.  Soft spoken in setting laryngitis  Neck: Supple, JVP to jaw, no carotid bruits, or masses. Cardiac: IR, IR, no murmurs, rubs, or gallops. No clubbing, cyanosis, trace bilat ankle edema.  Radials/DP/PT 2+ and equal bilaterally.  Respiratory:  Respirations regular and unlabored, faint crackles bilat bases GI: Soft, nontender, nondistended, BS + x 4. MS: no deformity or atrophy. Skin: warm and dry, no rash. Neuro:  Strength and sensation are intact. Psych: AAOx3.  Normal affect.  Labs    Chemistry Recent Labs  Lab 03/29/18 0233 03/30/18 0401 03/31/18 0428  NA 132* 132* 129*  K 3.2* 3.9 4.2  CL 97* 100* 97*  CO2 25 23 23   GLUCOSE 127* 125* 116*  BUN 20 19 19   CREATININE 0.84 0.91 1.06*  CALCIUM 8.7* 8.9 8.6*  GFRNONAA >60 58* 49*  GFRAA >60 >60 56*  ANIONGAP 10 9 9      Hematology Recent Labs  Lab 03/29/18 0233 03/30/18 0401 03/31/18 0428  WBC 5.9 4.5 4.3  RBC 2.99*  2.99* 3.04* 3.05*  HGB 10.1* 10.2* 10.3*  HCT 29.3* 30.0* 30.3*  MCV 98.0 98.6 99.3  MCH 33.6 33.5  33.7  MCHC 34.3 34.0 34.0  RDW 16.9* 16.7* 16.7*  PLT 246 252 246    Cardiac Enzymes Recent Labs  Lab 03/25/18 2034 03/26/18 0156 03/26/18 0730 03/26/18 1336  TROPONINI 0.08* 0.09* 0.07* 0.07*      BNP Recent Labs  Lab 03/25/18 2034  BNP 1,912.0*      Radiology    No results found.  Telemetry    Afib, 70-90. - Personally Reviewed  Cardiac Studies   TTE 03/26/2018: Study Conclusions   - Left ventricle: The cavity size was normal. Systolic function was   moderately to severely reduced. The estimated ejection fraction   was in the range of 30% to 35%. Hypokinesis of the anteroseptal   myocardium. Hypokinesis of the anterior myocardium. Basal wall   motion best preserved. Hypokinesis of the apical myocardium. - Mitral valve: There was mild regurgitation. - Left atrium: The atrium was mildly to moderately dilated. - Right ventricle: The cavity size was moderately dilated. Wall    thickness was normal. Systolic function was moderately to   severely reduced. - Right atrium: The atrium was mildly dilated. - Atrial septum: There was a patent foramen ovale. - Tricuspid valve: There was moderate-severe regurgitation. - Pulmonary arteries: Systolic pressure was moderate to severely   elevated PA peak pressure: 58 mm Hg (S).   Impressions:   - rhythm is atrial fibrillation.  Patient Profile     82 y.o. female with history of CAD s/p 4-vessel CABG in 1998 s/p PCI to the SVG-RCA in 05/2016, persistent Afib previously on Eliquis, chronic diastolic CHF, HTN, HLD, GI bleed in 02/2018 who was admitted with acute on chronic diastolic CHF and subsequently found to have new LV dysfxn.  Assessment & Plan    1.  Acute systolic CHF/PAH:  Prior h/o diast CHF. Admitted with dyspnea and volume overload and found to have new LV dysfxn, EF 30-35% w/ anterior, anteroseptal, and apical HK.  Minus 6.5L since admission.  Wt down to 182 on 6/23, listed @ 187 this AM, though only +42 yesterday on PO lasix. She feels more dyspneic over past 2 days.  JVD noted w/ faint crackles on exam.  I suspect she will require a retrial of IV lasix and offered to start this now.  She is scheduled for cath this afternoon and does not want to have to void throughout procedure - she prefers to wt to see what EDP is and then plan on diuresis post-cath, which is reasonable.  Cont spiro, acei.  No  blocker given prior intolerances (dyspnea, fatigue, hypotension).  She wishes to stay on verapamil. Given poor response to lasix 40 daily, she will likely either req a higher dose or torsemide.  2.  Persistent Afib:  Rate controlled - 70's to 90's. Remains on verapamil despite LV dysfxn 2/2 prior intolerance of  blockers.  This is her preference.  Eliquis on hold given plan for cath.  3.  Elevated troponin: minimal elevation w/ flat trend, peak 0.09. No chest pain.  Plan for cath today in light of new LV dysfxn.  4.  Anemia:   Stable.  5.  Essential HTN:  Stable.  6.  Recent GIB:  2/2 hemorrhoids.  Tolerating heparin.  H/H stable.  7.  Hypokalemia: stable.  8.  AKI:  Creat stable.   Signed, Murray Hodgkins, NP  03/31/2018, 10:41 AM    For questions or updates, please contact   Please consult [email or web address] for contact info under Cardiology/STEMI.

## 2018-03-31 NOTE — Progress Notes (Signed)
PT Cancellation Note  Patient Details Name: Charlotte Wagner, Dr. MRN: [id number] DOB: [birth year]   Cancelled Treatment:    Reason Eval/Treat Not Completed: Patient declined, no reason specified;Other (comment). Pt refuses at this time, noting feeling terrible with difficulty breathing (pt whispering to talk). Pt is going for a cardiac cath procedure today. Re attempt tomorrow.    Larae Grooms, PTA 03/31/2018, 12:52 PM

## 2018-03-31 NOTE — Progress Notes (Signed)
Pt returned from cath lab. She is a/o. Rt groin site is clean. Pos pedal pulses.  Pure wick applid. Pt not to be up until 2205. angiomax infusing at 4.2 mls /hr. Until 2005

## 2018-03-31 NOTE — Progress Notes (Addendum)
Patient stable. minimial complaints of pain. Relieved by medication. Angiomax stopped at post-procedure protocol. No signs of bleeding or hematoma. PAD deflated, removed and dressing placed. Patient tolerating ambulation with supervision. Will monitor

## 2018-03-31 NOTE — Interval H&P Note (Signed)
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 After consideration of risks, benefits and other options for treatment, the patient has consented to  Procedure(s): LEFT HEART CATH AND CORONARY ANGIOGRAPHY (N/A) as a surgical intervention .  The patient's history has been reviewed, patient examined, no change in status, stable for surgery.  I have reviewed the patient's chart and labs.  Questions were answered to the patient's satisfaction.     Kathlyn Sacramento

## 2018-03-31 NOTE — Progress Notes (Signed)
ANTICOAGULATION CONSULT NOTE - Initial Consult  Pharmacy Consult for Heparin Indication: atrial fibrillation  Allergies  Allergen Reactions  . Beta Adrenergic Blockers Shortness Of Breath    DEPRESSION/HYPOTENSION  . Brilinta [Ticagrelor] Shortness Of Breath    Caused AFIB  . Albuterol     rigors  . Aspirin Other (See Comments)    Heartburn   . Nsaids Other (See Comments)    ULCER HISTORY & CURRENTLY ON BLOOD THINNERS  . Penicillins Hives and Other (See Comments)    Has patient had a PCN reaction causing immediate rash, facial/tongue/throat swelling, SOB or lightheadedness with hypotension: No Has patient had a PCN reaction causing severe rash involving mucus membranes or skin necrosis: No Has patient had a PCN reaction that required hospitalization No Has patient had a PCN reaction occurring within the last 10 years: No If all of the above answers are "NO", then may proceed with Cephalosporin use.   . Statins Other (See Comments)    Muscle weakness  . Sulfa Antibiotics Rash    At mouth  . Sulfasalazine Rash    At mouth  . Tetracycline Rash  . Tetracyclines & Related Rash    Patient Measurements: Height: 5\' 3"  (160 cm) Weight: 187 lb 8 oz (85 kg) IBW/kg (Calculated) : 52.4 Heparin Dosing Weight: 70.6 kg  Vital Signs: Temp: 97.6 F (36.4 C) (06/24 0505) Temp Source: Oral (06/24 0505) BP: 109/74 (06/24 0505) Pulse Rate: 77 (06/24 0505)  Labs: Recent Labs    03/28/18 0841  03/29/18 0233 03/30/18 0401 03/31/18 0428  HGB 10.7*  --  10.1* 10.2* 10.3*  HCT 30.8*  --  29.3* 30.0* 30.3*  PLT 279  --  246 252 246  APTT 34   < > 87* 108* 99*  LABPROT 16.5*  --   --   --   --   INR 1.34  --   --   --   --   HEPARINUNFRC >3.60*  --  2.66* 1.71* 1.05*  CREATININE  --   --  0.84 0.91  --    < > = values in this interval not displayed.    Estimated Creatinine Clearance: 51.8 mL/min (by C-G formula based on SCr of 0.91 mg/dL).   Assessment: 82 yo female on Eliquis  for AFib to transition to heparin drip in prep for cath on Monday. Last dose of apixaban charted as 6/20 at 2141.  Goal of Therapy:  Heparin level 0.3-0.7 units/ml Monitor platelets by anticoagulation protocol: Yes   Plan:  06/22 @ 0230 aPTT 87 seconds therapeutic. HL 2.66 will continue to dose off aPTT for now and continue current rate, will recheck aPTT w/ am labs. CBC stable.  06/23  @ 0400:   APTT = 108,  HL = 1.71 Will continue this pt on current rate and recheck aPTT and HL on 6/24 with AM labs.   06/24 @ 0500 aPTT 99 seconds, HL 1.05. APTT therapeutic will continue current rate and will recheck aPTT/HL w/ am labs.  Tobie Lords, PharmD, BCPS Clinical Pharmacist 03/31/2018

## 2018-03-31 NOTE — Progress Notes (Signed)
[place] for Angiomax Indication: chest pain/ACS  Allergies  Allergen Reactions  . Beta Adrenergic Blockers Shortness Of Breath    DEPRESSION/HYPOTENSION  . Brilinta [Ticagrelor] Shortness Of Breath    Caused AFIB  . Albuterol     rigors  . Antihistamines, Diphenhydramine-Type     Muscle spasms  . Aspirin Other (See Comments)    Heartburn   . Nsaids Other (See Comments)    ULCER HISTORY & CURRENTLY ON BLOOD THINNERS  . Penicillins Hives and Other (See Comments)    Has patient had a PCN reaction causing immediate rash, facial/tongue/throat swelling, SOB or lightheadedness with hypotension: No Has patient had a PCN reaction causing severe rash involving mucus membranes or skin necrosis: No Has patient had a PCN reaction that required hospitalization No Has patient had a PCN reaction occurring within the last 10 years: No If all of the above answers are "NO", then may proceed with Cephalosporin use.   . Statins Other (See Comments)    Muscle weakness  . Sulfa Antibiotics Rash    At mouth  . Sulfasalazine Rash    At mouth  . Tetracycline Rash  . Tetracyclines & Related Rash    Patient Measurements: Height: 5\' 3"  (160 cm) Weight: 187 lb 8 oz (85 kg) IBW/kg (Calculated) : 52.4  Vital Signs: Temp: 98.1 F (36.7 C) (06/24 1349) Temp Source: Oral (06/24 1349) BP: 124/75 (06/24 1645) Pulse Rate: 85 (06/24 1349)  Labs: Recent Labs    03/29/18 0233 03/30/18 0401 03/31/18 0428  HGB 10.1* 10.2* 10.3*  HCT 29.3* 30.0* 30.3*  PLT 246 252 246  APTT 87* 108* 99*  HEPARINUNFRC 2.66* 1.71* 1.05*  CREATININE 0.84 0.91 1.06*    Estimated Creatinine Clearance: 44.4 mL/min (A) (by C-G formula based on SCr of 1.06 mg/dL (H)).   Medical History: Past Medical History:  Diagnosis Date  . Allergy   . Arthritis    s/p bilateral knees and left hip replacement  . Asthma   . CAD (coronary artery disease)    a. 12/1996 s/p CABG x4  ([place]);  b. 2005 Pt reports stress test & cath, which revealed patent grafts.  . Cancer ([place])    melanoma right arm  . Carotid arterial disease ([place])    a. 04/2015 Carotid U/S: <50% bilat ICA stenosis.  . Chronic kidney disease   . Colon polyps    H/O  . Depression   . GERD (gastroesophageal reflux disease)    h/o hiatal hernia  . Headache    migraines in past  . Heart murmur    a. 04/2011 Echo: EF 55-60%, bilat atrial enlargement, mild to mod TR.  [name] History of chicken pox   . History of hiatal hernia   . Hx of migraines    rare now  . Hx: UTI (urinary tract infection)   . Hyperlipidemia    a. Statin intolerant -->on zetia.  . Hypertension   . Hypertensive heart disease   . Lichen planus   . Melanoma ([place])   . Palpitations    a. rare PVC's and h/o SVT.  [name] PMR (polymyalgia rheumatica) ([facility])    h/o in setting of crestor usage.  [name] PSVT (paroxysmal supraventricular tachycardia) ([place])   . PUD (peptic ulcer disease)    remote history  . Raynaud's phenomenon   . Spinal stenosis   . Urine incontinence    H/O  . Vitamin D deficiency    Assessment: 82 y/o F ordered  Angiomax x 4 hour post-PCI. Renal function is adequate.    Plan:  Will order Angiomax 0.25 mg/kg/hr x 4 hours.   Ulice Dash D 03/31/2018,4:58 PM

## 2018-04-01 ENCOUNTER — Ambulatory Visit: Payer: Medicare Other | Admitting: General Surgery

## 2018-04-01 ENCOUNTER — Telehealth: Payer: Self-pay | Admitting: *Deleted

## 2018-04-01 ENCOUNTER — Encounter: Payer: Self-pay | Admitting: Cardiovascular Disease

## 2018-04-01 DIAGNOSIS — M25062 Hemarthrosis, left knee: Secondary | ICD-10-CM | POA: Diagnosis not present

## 2018-04-01 DIAGNOSIS — I509 Heart failure, unspecified: Secondary | ICD-10-CM | POA: Diagnosis not present

## 2018-04-01 DIAGNOSIS — I481 Persistent atrial fibrillation: Secondary | ICD-10-CM | POA: Diagnosis not present

## 2018-04-01 DIAGNOSIS — S20219D Contusion of unspecified front wall of thorax, subsequent encounter: Secondary | ICD-10-CM | POA: Diagnosis not present

## 2018-04-01 DIAGNOSIS — I10 Essential (primary) hypertension: Secondary | ICD-10-CM | POA: Diagnosis not present

## 2018-04-01 DIAGNOSIS — S60212D Contusion of left wrist, subsequent encounter: Secondary | ICD-10-CM | POA: Diagnosis not present

## 2018-04-01 DIAGNOSIS — R748 Abnormal levels of other serum enzymes: Secondary | ICD-10-CM | POA: Diagnosis not present

## 2018-04-01 DIAGNOSIS — E7800 Pure hypercholesterolemia, unspecified: Secondary | ICD-10-CM | POA: Diagnosis not present

## 2018-04-01 DIAGNOSIS — S2241XD Multiple fractures of ribs, right side, subsequent encounter for fracture with routine healing: Secondary | ICD-10-CM | POA: Diagnosis not present

## 2018-04-01 DIAGNOSIS — I429 Cardiomyopathy, unspecified: Secondary | ICD-10-CM | POA: Diagnosis not present

## 2018-04-01 DIAGNOSIS — I5043 Acute on chronic combined systolic (congestive) and diastolic (congestive) heart failure: Secondary | ICD-10-CM | POA: Diagnosis not present

## 2018-04-01 DIAGNOSIS — S8001XD Contusion of right knee, subsequent encounter: Secondary | ICD-10-CM | POA: Diagnosis not present

## 2018-04-01 DIAGNOSIS — I480 Paroxysmal atrial fibrillation: Secondary | ICD-10-CM | POA: Diagnosis not present

## 2018-04-01 DIAGNOSIS — I5022 Chronic systolic (congestive) heart failure: Secondary | ICD-10-CM | POA: Diagnosis not present

## 2018-04-01 DIAGNOSIS — S23151D Dislocation of T8/T9 thoracic vertebra, subsequent encounter: Secondary | ICD-10-CM | POA: Diagnosis not present

## 2018-04-01 DIAGNOSIS — F39 Unspecified mood [affective] disorder: Secondary | ICD-10-CM | POA: Diagnosis not present

## 2018-04-01 DIAGNOSIS — S8002XD Contusion of left knee, subsequent encounter: Secondary | ICD-10-CM | POA: Diagnosis not present

## 2018-04-01 DIAGNOSIS — I25119 Atherosclerotic heart disease of native coronary artery with unspecified angina pectoris: Secondary | ICD-10-CM | POA: Diagnosis not present

## 2018-04-01 DIAGNOSIS — R0600 Dyspnea, unspecified: Secondary | ICD-10-CM | POA: Diagnosis not present

## 2018-04-01 DIAGNOSIS — Z741 Need for assistance with personal care: Secondary | ICD-10-CM | POA: Diagnosis not present

## 2018-04-01 DIAGNOSIS — R293 Abnormal posture: Secondary | ICD-10-CM | POA: Diagnosis not present

## 2018-04-01 DIAGNOSIS — R2681 Unsteadiness on feet: Secondary | ICD-10-CM | POA: Diagnosis not present

## 2018-04-01 DIAGNOSIS — I119 Hypertensive heart disease without heart failure: Secondary | ICD-10-CM | POA: Diagnosis not present

## 2018-04-01 DIAGNOSIS — M4800 Spinal stenosis, site unspecified: Secondary | ICD-10-CM | POA: Diagnosis not present

## 2018-04-01 DIAGNOSIS — I482 Chronic atrial fibrillation: Secondary | ICD-10-CM | POA: Diagnosis not present

## 2018-04-01 DIAGNOSIS — I5033 Acute on chronic diastolic (congestive) heart failure: Secondary | ICD-10-CM | POA: Diagnosis not present

## 2018-04-01 DIAGNOSIS — I25118 Atherosclerotic heart disease of native coronary artery with other forms of angina pectoris: Secondary | ICD-10-CM | POA: Diagnosis not present

## 2018-04-01 DIAGNOSIS — K219 Gastro-esophageal reflux disease without esophagitis: Secondary | ICD-10-CM | POA: Diagnosis not present

## 2018-04-01 DIAGNOSIS — R278 Other lack of coordination: Secondary | ICD-10-CM | POA: Diagnosis not present

## 2018-04-01 LAB — BASIC METABOLIC PANEL
Anion gap: 11 (ref 5–15)
BUN: 17 mg/dL (ref 8–23)
CALCIUM: 9 mg/dL (ref 8.9–10.3)
CO2: 25 mmol/L (ref 22–32)
CREATININE: 1 mg/dL (ref 0.44–1.00)
Chloride: 99 mmol/L (ref 98–111)
GFR calc Af Amer: >60 mL/min (ref >60)
GFR calc non Af Amer: 52 mL/min — ABNORMAL LOW (ref >60)
GLUCOSE: 108 mg/dL — AB (ref 70–99)
POTASSIUM: 3.8 mmol/L (ref 3.5–5.1)
Sodium: 135 mmol/L (ref 135–145)

## 2018-04-01 LAB — CBC
HCT: 30.9 % — ABNORMAL LOW (ref 35.0–47.0)
Hemoglobin: 10.6 g/dL — ABNORMAL LOW (ref 12.0–16.0)
MCH: 34 pg (ref 26.0–34.0)
MCHC: 34.2 g/dL (ref 32.0–36.0)
MCV: 99.2 fL (ref 80.0–100.0)
PLATELETS: 267 10*3/uL (ref 150–440)
RBC: 3.12 MIL/uL — ABNORMAL LOW (ref 3.80–5.20)
RDW: 17.3 % — AB (ref 11.5–14.5)
WBC: 5.9 10*3/uL (ref 3.6–11.0)

## 2018-04-01 LAB — APTT: aPTT: 26 seconds (ref 24–36)

## 2018-04-01 MED ORDER — APIXABAN 5 MG PO TABS
5.0000 mg | ORAL_TABLET | Freq: Two times a day (BID) | ORAL | Status: DC
  Administered 2018-04-01: 5 mg via ORAL
  Filled 2018-04-01: qty 1

## 2018-04-01 MED ORDER — CEFDINIR 300 MG PO CAPS: 300.0000 mg | ORAL_CAPSULE | Freq: Two times a day (BID) | ORAL | 0 refills | Status: DC

## 2018-04-01 MED ORDER — NITROGLYCERIN 0.4 MG SL SUBL: 0.4000 mg | SUBLINGUAL_TABLET | SUBLINGUAL | 0 refills | Status: DC | PRN

## 2018-04-01 MED ORDER — TRAMADOL HCL 50 MG PO TABS: 50.0000 mg | ORAL_TABLET | Freq: Every day | ORAL | 0 refills | Status: AC | PRN

## 2018-04-01 MED ORDER — APIXABAN 5 MG PO TABS: 5.0000 mg | ORAL_TABLET | Freq: Two times a day (BID) | ORAL | Status: DC

## 2018-04-01 MED ORDER — RAMIPRIL 5 MG PO CAPS: 5.0000 mg | ORAL_CAPSULE | Freq: Every day | ORAL | 0 refills | Status: DC

## 2018-04-01 MED ORDER — FUROSEMIDE 40 MG PO TABS: 40.0000 mg | ORAL_TABLET | Freq: Every day | ORAL | 0 refills | Status: DC

## 2018-04-01 MED ORDER — CLOPIDOGREL BISULFATE 75 MG PO TABS: 75.0000 mg | ORAL_TABLET | Freq: Every day | ORAL | 0 refills | Status: DC

## 2018-04-01 MED ORDER — APIXABAN 5 MG PO TABS: 5.0000 mg | ORAL_TABLET | Freq: Two times a day (BID) | ORAL | 0 refills | Status: DC

## 2018-04-01 NOTE — Telephone Encounter (Signed)
-----   Message from Blain Pais sent at 04/01/2018  9:52 AM EDT ----- Regarding: tcm/ph 7/3 3:20 Dr. Rockey Situ

## 2018-04-01 NOTE — Care Management (Signed)
Discharge today per Dr. Benjie Karvonen. Would like Dr. Delene Loll to go home with Home Health/physical therapy. Spoke with Dr. Delene Loll at the bedside. "Seth Bake is expecting me at the [facility]."  Spoke with Mel Almond, Clinical Social Worker. Will be arranging for Dr. Delene Loll to go to the [facility]. Will update Dr. Mirna Mires RN MSN CCM Care Management [phone]

## 2018-04-01 NOTE — Progress Notes (Addendum)
Physical Therapy Treatment Patient Details Name: Charlotte Wagner, Dr. MRN: [id number] DOB: 1936/06/15 Today's Date: 04/01/2018    History of Present Illness 82 yo female with onset of LE weakness, declining cardiac function, SOB and LE edema, recent GI bleed with low Na+, EF 30-35% and recent Ecoli sepsis was admitted.  Has atelectasis and cardiomegaly, low O2 sats with effort.  PMHx:  CAD, CABG, weakness, ischemic cardiomyopathy, CHF,     PT Comments    Pt in recliner, ready to walk.  Stood and was able to ambulate x 2 around unit with walker and generally steady gait.  No buckling or LOB.  Flexed posture which she stated is her baseline from spinal stenosis.  Pt reported feeling good and ready to return to her apartment at [facility].  Discussed with MD.  Will recommend HHPT then transition to OPPT at [facility] when appropriate.  Has rollator at home.  HR and O2 remained in normal expected limits during session.   Follow Up Recommendations  Home health PT     Equipment Recommendations       Recommendations for Other Services       Precautions / Restrictions Precautions Precautions: Fall Restrictions Weight Bearing Restrictions: No    Mobility  Bed Mobility Overal bed mobility: Modified Independent                Transfers Overall transfer level: Modified independent   Transfers: Sit to/from Stand Sit to Stand: Modified independent (Device/Increase time)            Ambulation/Gait Ambulation/Gait assistance: Supervision;Min guard Gait Distance (Feet): 340 Feet Assistive device: Rolling walker (2 wheeled) Gait Pattern/deviations: Step-through pattern;Decreased step length - right;Decreased step length - left;Trunk flexed Gait velocity: reduced   General Gait Details: generally steady - uses rollator at home which she states is easier than RW   Stairs             Wheelchair Mobility    Modified Rankin (Stroke Patients Only)       Balance  Overall balance assessment: Modified Independent                                          Cognition Arousal/Alertness: Awake/alert Behavior During Therapy: WFL for tasks assessed/performed Overall Cognitive Status: Within Functional Limits for tasks assessed                                        Exercises      General Comments        Pertinent Vitals/Pain Pain Assessment: No/denies pain    Home Living                      Prior Function            PT Goals (current goals can now be found in the care plan section) Progress towards PT goals: Progressing toward goals    Frequency    Min 2X/week      PT Plan Current plan remains appropriate    Co-evaluation              AM-PAC PT "6 Clicks" Daily Activity  Outcome Measure  Difficulty turning over in bed (including adjusting bedclothes, sheets and blankets)?: None Difficulty moving from lying on  back to sitting on the side of the bed? : None Difficulty sitting down on and standing up from a chair with arms (e.g., wheelchair, bedside commode, etc,.)?: None Help needed moving to and from a bed to chair (including a wheelchair)?: None Help needed walking in hospital room?: A Little Help needed climbing 3-5 steps with a railing? : A Little 6 Click Score: 22    End of Session Equipment Utilized During Treatment: Gait belt Activity Tolerance: Patient tolerated treatment well Patient left: in bed;with call bell/phone within reach;with bed alarm set Nurse Communication: Mobility status       Time: 5830-9407 PT Time Calculation (min) (ACUTE ONLY): 18 min  Charges:  $Gait Training: 8-22 mins                    G Codes:       Chesley Noon, PTA 04/01/18, 9:23 AM

## 2018-04-01 NOTE — Progress Notes (Addendum)
Patient is medically stable for D/C to [facility] today. Per Seth Bake admissions coordinator at [facility] patient will have to come to the healthcare SNF building before she can return to independent living. Patient will go to room 319. RN will call report and patient's friend Lelon Frohlich will transport. Clinical Education officer, museum (CSW) sent D/C orders to [facility] via [facility]. Patient is aware of above. CSW left patient's daughter Benjamine Mola a voicemail making her aware of above. Please reconsult if future social work needs arise. CSW signing off.   Patient's daughter Benjamine Mola called CSW back and was made aware of above.   [employer], LCSW [phone]

## 2018-04-01 NOTE — Clinical Social Work Placement (Addendum)
   CLINICAL SOCIAL WORK PLACEMENT  NOTE  Date:  04/01/2018  Patient Details  Name: Charlotte Wagner, Dr. MRN: [id number] Date of Birth: 1935/12/29  Clinical Social Work is seeking post-discharge placement for this patient at the [place] level of care (*CSW will initial, date and re-position this form in  chart as items are completed):  Yes   Patient/family provided with [place] Work Department's list of facilities offering this level of care within the geographic area requested by the patient (or if unable, by the patient's family).  Yes   Patient/family informed of their freedom to choose among providers that offer the needed level of care, that participate in Medicare, Medicaid or managed care program needed by the patient, have an available bed and are willing to accept the patient.  Yes   Patient/family informed of [facility]'s ownership interest in [facility] and [facility], as well as of the fact that they are under no obligation to receive care at these facilities.  PASRR submitted to EDS on 03/28/18     PASRR number received on 03/28/18     Existing PASRR number confirmed on       FL2 transmitted to all facilities in geographic area requested by pt/family on 03/28/18     FL2 transmitted to all facilities within larger geographic area on       Patient informed that his/her managed care company has contracts with or will negotiate with certain facilities, including the following:        Yes   Patient/family informed of bed offers received.  Patient chooses bed at [facility]     Physician recommends and patient chooses bed at      Patient to be transferred to [facility] on 04/01/18.  Patient to be transferred to facility by (Patient's friend Lelon Frohlich will transport. )     Patient family notified on 04/01/18 of transfer.  Name of family member notified:  (CSW left patient's daughter Benjamine Mola a Advertising account executive.  ) Patient's daughter  Benjamine Mola called CSW back and was made aware of D/C today.   PHYSICIAN       Additional Comment:    _______________________________________________ Wyvonne Carda, Veronia Beets, LCSW 04/01/2018, 12:17 PM

## 2018-04-01 NOTE — Telephone Encounter (Signed)
Patient currently admitted at this time. 

## 2018-04-01 NOTE — Progress Notes (Signed)
Progress Note  Patient Name: Charlotte Wagner, Dr. Date of Encounter: 04/01/2018  Primary Cardiologist: Ida Rogue, MD  Subjective   Status post PCI of the vein graft to the diagonal yesterday.  No chest pain overnight.  Significant improvement in dyspnea.  She has yet to ambulate but physical therapy is ordered.  She feels very well this morning and is excited at the prospect of discharge.  Inpatient Medications    Scheduled Meds: . acidophilus  2 capsule Oral TID  . amitriptyline  25 mg Oral QHS  . cefdinir  300 mg Oral BID  . clopidogrel  75 mg Oral Q breakfast  . ezetimibe  10 mg Oral Daily  . ferrous sulfate  325 mg Oral BID WC  . furosemide  40 mg Oral Daily  . mometasone-formoterol  2 puff Inhalation BID  . pantoprazole  40 mg Oral BID  . polyethylene glycol  17 g Oral Daily  . potassium chloride  20 mEq Oral Daily  . ramipril  5 mg Oral Daily  . sodium chloride flush  3 mL Intravenous Q12H  . sodium chloride flush  3 mL Intravenous Q12H  . spironolactone  25 mg Oral q morning - 10a  . traZODone  250 mg Oral QHS  . verapamil  180 mg Oral BID   Continuous Infusions: . sodium chloride     PRN Meds: sodium chloride, acetaminophen **OR** acetaminophen, nitroGLYCERIN, ondansetron **OR** ondansetron (ZOFRAN) IV, sodium chloride flush, traMADol   Vital Signs    Vitals:   03/31/18 1845 03/31/18 1946 04/01/18 0410 04/01/18 0756  BP: (!) 117/57 98/74 111/60 118/61  Pulse: 77 74 64 66  Resp: 14 18 17    Temp: 97.8 F (36.6 C) 98.5 F (36.9 C) 97.9 F (36.6 C) 97.9 F (36.6 C)  TempSrc: Oral Oral Oral Oral  SpO2: 95% 95% 97% 96%  Weight:   176 lb 1.6 oz (79.9 kg)   Height:        Intake/Output Summary (Last 24 hours) at 04/01/2018 0827 Last data filed at 04/01/2018 0801 Gross per 24 hour  Intake 952 ml  Output 4601 ml  Net -3649 ml   Filed Weights   03/30/18 0330 03/31/18 0505 04/01/18 0410  Weight: 182 lb 1.6 oz (82.6 kg) 187 lb 8 oz (85 kg) 176 lb 1.6  oz (79.9 kg)    Physical Exam   GEN: Well nourished, well developed, in no acute distress.  HEENT: Grossly normal.  Neck: Supple, no significant JVD, carotid bruits, or masses. Cardiac: Irregularly irregular, no murmurs, rubs, or gallops. No clubbing, cyanosis, edema.  Radials/DP/PT 2+ and equal bilaterally.  Right groin catheterization site without bleeding, bruit, hematoma. Respiratory:  Respirations regular and unlabored, clear to auscultation bilaterally. GI: Soft, nontender, nondistended, BS + x 4. MS: no deformity or atrophy. Skin: warm and dry, no rash. Neuro:  Strength and sensation are intact. Psych: AAOx3.  Normal affect.  Labs    Chemistry Recent Labs  Lab 03/30/18 0401 03/31/18 0428 04/01/18 0441  NA 132* 129* 135  K 3.9 4.2 3.8  CL 100* 97* 99  CO2 23 23 25   GLUCOSE 125* 116* 108*  BUN 19 19 17   CREATININE 0.91 1.06* 1.00  CALCIUM 8.9 8.6* 9.0  GFRNONAA 58* 49* 52*  GFRAA >60 56* >60  ANIONGAP 9 9 11      Hematology Recent Labs  Lab 03/30/18 0401 03/31/18 0428 04/01/18 0441  WBC 4.5 4.3 5.9  RBC 3.04* 3.05* 3.12*  HGB  10.2* 10.3* 10.6*  HCT 30.0* 30.3* 30.9*  MCV 98.6 99.3 99.2  MCH 33.5 33.7 34.0  MCHC 34.0 34.0 34.2  RDW 16.7* 16.7* 17.3*  PLT 252 246 267    Cardiac Enzymes Recent Labs  Lab 03/25/18 2034 03/26/18 0156 03/26/18 0730 03/26/18 1336  TROPONINI 0.08* 0.09* 0.07* 0.07*      BNP Recent Labs  Lab 03/25/18 2034  BNP 1,912.0*      Radiology    No results found.  Telemetry    Atrial fibrillation in the 70s to 90s. - Personally Reviewed With Cardiac Studies   TTE 03/26/2018: Study Conclusions  - Left ventricle: The cavity size was normal. Systolic function was moderately to severely reduced. The estimated ejection fraction was in the range of 30% to 35%. Hypokinesis of the anteroseptal myocardium. Hypokinesis of the anterior myocardium. Basal wall motion best preserved. Hypokinesis of the apical  myocardium. - Mitral valve: There was mild regurgitation. - Left atrium: The atrium was mildly to moderately dilated. - Right ventricle: The cavity size was moderately dilated. Wall thickness was normal. Systolic function was moderately to severely reduced. - Right atrium: The atrium was mildly dilated. - Atrial septum: There was a patent foramen ovale. - Tricuspid valve: There was moderate-severe regurgitation. - Pulmonary arteries: Systolic pressure was moderate to severely elevated PA peak pressure: 58 mm Hg (S).  Impressions:  - rhythm is atrial fibrillation. _____________   Cardiac Catheterization and Percutaneous Coronary Intervention 6.24.2019   Ost LM to Mid LM lesion is 80% stenosed.  Ost LAD to Prox LAD lesion is 100% stenosed.  Prox Cx lesion is 80% stenosed.  Prox RCA to Mid RCA lesion is 80% stenosed.  Mid RCA to Dist RCA lesion is 100% stenosed.  Prox Graft lesion is 30% stenosed.  Post Atrio lesion is 70% stenosed.  Prox Graft lesion is 95% stenosed.  Origin lesion is 30% stenosed.  LIMA and is normal in caliber.  The graft exhibits no disease.  Post intervention, there is a 0% residual stenosis.  A drug-eluting stent was successfully placed using a STENT RESOLUTE ONYX 4.0X15.   1.  Significant underlying three-vessel coronary artery disease with patent grafts including LIMA to LAD, SVG to RCA, SVG to OM and SVG to diagonal.  Patent stent in the SVG to RCA.  Severe new stenosis and SVG to diagonal is likely the culprit. 2.  Left ventricular angiography was not performed. 3.  High normal left ventricular end-diastolic pressure at 12 mmHg. 4.  Successful direct stenting of SVG to diagonal using a distal protection device.  Recommendations: Eliquis can be resumed tomorrow if no bleeding complications.  Use Plavix monotherapy without aspirin for 1 year. The patient appears to be euvolemic based on LVEDP.  Continue oral  furosemide. _____________    Patient Profile     82 y.o.femalewith history of CAD s/p 4-vessel CABG in 1998 s/p PCI to the SVG-RCA in 05/2016, persistent Afib previously on Eliquis, chronic diastolic CHF, HTN, HLD, GI bleed in 02/2018 who was admitted with acute on chronic diastolic CHF and subsequently found to have new LV dysfxn.  Assessment & Plan    1.  Acute systolic congestive heart failure/pulmonary arterial hypertension: Admitted with dyspnea and volume overload in the setting of prior history of diastolic heart failure however echo showed new LV dysfunction with an EF of 30 to 35% with inferior, anteroseptal, and apical hypokinesis.  She received an extra dose of Lasix yesterday and was -2.8 L.  -7.8  L for the entire admission.  Breathing much improved.  LVEDP was 10 on catheterization yesterday.  Catheterization also revealed vein graft to diagonal disease which required PCI.  Suspect dyspnea and heart failure largely ischemic in nature.  Continue Lasix 40 mg daily, Spironolactone, and ramipril.  She refuses beta-blocker and prefers to stay on verapamil despite contraindication in LV dysfunction.  2.  Coronary artery disease/elevated troponin: Status post prior CABG.  Catheterization yesterday revealed significant disease in the vein graft to the diagonal.  This was successfully treated with a drug-eluting stent.  No chest pain or dyspnea this morning.  Ambulate.  She will benefit from cardiac rehab.  Continue Plavix.  No aspirin in the setting of prior intolerance and also chronic Eliquis therapy.  Resume Eliquis today.  She is intolerant to statins and remains on Zetia.  3.  Permanent atrial fibrillation: Rate controlled on verapamil therapy.  She prefers to stay on verapamil instead of switching to beta-blocker in the setting of above.  Resume Eliquis today.  4.  Anemia: Stable.  5.  Essential hypertension: Stable.  6.  Recent GI bleed: Secondary to hemorrhoids.  H&H stable on  heparin throughout admission.  Resuming Eliquis today.  7.  Hypokalemia: Stable on supplementation.  8.  Acute kidney injury: Creatinine stable.  Continue Lasix and ACE inhibitor.  Signed, Murray Hodgkins, NP  04/01/2018, 8:27 AM    For questions or updates, please contact   Please consult [email or web address] for contact info under Cardiology/STEMI.

## 2018-04-01 NOTE — Discharge Summary (Addendum)
[facility] NAME: Charlotte Wagner    MR#:  [id number]  DATE OF BIRTH:  [birth year]  DATE OF ADMISSION:  03/25/2018 ADMITTING PHYSICIAN: Lance Coon, MD  DATE OF DISCHARGE: 04/01/2018  PRIMARY CARE PHYSICIAN: [name], MD    ADMISSION DIAGNOSIS:  Elevated troponin [R74.8] Acute on chronic congestive heart failure, unspecified heart failure type ([place]) [I50.9]  DISCHARGE DIAGNOSIS:  Principal Problem:   Acute on chronic diastolic CHF (congestive heart failure) ([place]) Active Problems:   CAD (coronary artery disease)   Essential hypertension, benign   GERD (gastroesophageal reflux disease)   Unstable angina (HCC)   Hyperlipidemia   Persistent atrial fibrillation ([place])   SECONDARY DIAGNOSIS:   Past Medical History:  Diagnosis Date  . Allergy   . Arthritis    s/p bilateral knees and left hip replacement  . Asthma   . CAD (coronary artery disease)    a. 12/1996 s/p CABG x4 ([place]);  b. 2005 Pt reports stress test & cath, which revealed patent grafts.  . Cancer ([place])    melanoma right arm  . Carotid arterial disease ([place])    a. 04/2015 Carotid U/S: <50% bilat ICA stenosis.  . Chronic kidney disease   . Colon polyps    H/O  . Depression   . GERD (gastroesophageal reflux disease)    h/o hiatal hernia  . Headache    migraines in past  . Heart murmur    a. 04/2011 Echo: EF 55-60%, bilat atrial enlargement, mild to mod TR.  [name] History of chicken pox   . History of hiatal hernia   . Hx of migraines    rare now  . Hx: UTI (urinary tract infection)   . Hyperlipidemia    a. Statin intolerant -->on zetia.  . Hypertension   . Hypertensive heart disease   . Lichen planus   . Melanoma ([place])   . Palpitations    a. rare PVC's and h/o SVT.  [name] PMR (polymyalgia rheumatica) (HCC)    h/o in setting of crestor usage.  [name] PSVT (paroxysmal supraventricular tachycardia) ([place])   . PUD (peptic ulcer disease)    remote history   . Raynaud's phenomenon   . Spinal stenosis   . Urine incontinence    H/O  . Vitamin D deficiency     HOSPITAL COURSE:   82 year old female with history of CAD status post CABG and PCI, essential hypertension and permanent atrial fibrillation who presented due to shortness of breath.  1.  Acute systolic heart failure and acute on  chronic diastolic heart failure with pulmonary hypertension: She will continue Lasix, ACE inhibitor , Aldactone  She cannot tolerate BB She has a referral for CHF clinic upon discharge  2.  Persistent atrial fibrillation with heart rate controlled She will continue Eliquis and verapamil.   3.  New cardiomyopathy EF 30 to 35% with elevated troponin: Patient underwent cardiac catheterization whoch shows  Ost LM to Mid LM lesion is 80% stenosed.  Ost LAD to Prox LAD lesion is 100% stenosed.  Prox Cx lesion is 80% stenosed.  Prox RCA to Mid RCA lesion is 80% stenosed.  Mid RCA to Dist RCA lesion is 100% stenosed.  Prox Graft lesion is 30% stenosed.  Post Atrio lesion is 70% stenosed.  Prox Graft lesion is 95% stenosed.  Origin lesion is 30% stenosed.  LIMA and is normal in caliber.  The graft exhibits no disease.  Post intervention, there is a  0% residual stenosis.  A drug-eluting stent was successfully placed using a STENT RESOLUTE ONYX 4.0X15.  Recommendations are to restart Eliquis and Plavix for 1 year.  No aspirin.  She will continue PPI.  4.  Recent E. coli bacteremia: Continue Omnicef for total 2 weeks.  5.  Hyponatremia due to congestive heart failure which improved with Lasix     DISCHARGE CONDITIONS AND DIET:    Stable for discharge on heart healthy diet  CONSULTS OBTAINED:  Treatment Team:  Minna Merritts, MD Bettey Costa, MD  DRUG ALLERGIES:   Allergies  Allergen Reactions  . Beta Adrenergic Blockers Shortness Of Breath    DEPRESSION/HYPOTENSION  . Brilinta [Ticagrelor] Shortness Of Breath    Caused  AFIB  . Albuterol     rigors  . Antihistamines, Diphenhydramine-Type     Muscle spasms  . Aspirin Other (See Comments)    Heartburn   . Nsaids Other (See Comments)    ULCER HISTORY & CURRENTLY ON BLOOD THINNERS  . Penicillins Hives and Other (See Comments)    Has patient had a PCN reaction causing immediate rash, facial/tongue/throat swelling, SOB or lightheadedness with hypotension: No Has patient had a PCN reaction causing severe rash involving mucus membranes or skin necrosis: No Has patient had a PCN reaction that required hospitalization No Has patient had a PCN reaction occurring within the last 10 years: No If all of the above answers are "NO", then may proceed with Cephalosporin use.   . Statins Other (See Comments)    Muscle weakness  . Sulfa Antibiotics Rash    At mouth  . Sulfasalazine Rash    At mouth  . Tetracycline Rash  . Tetracyclines & Related Rash    DISCHARGE MEDICATIONS:   Allergies as of 04/01/2018      Reactions   Beta Adrenergic Blockers Shortness Of Breath   DEPRESSION/HYPOTENSION   Brilinta [ticagrelor] Shortness Of Breath   Caused AFIB   Albuterol    rigors   Antihistamines, Diphenhydramine-type    Muscle spasms   Aspirin Other (See Comments)   Heartburn    Nsaids Other (See Comments)   ULCER HISTORY & CURRENTLY ON BLOOD THINNERS   Penicillins Hives, Other (See Comments)   Has patient had a PCN reaction causing immediate rash, facial/tongue/throat swelling, SOB or lightheadedness with hypotension: No Has patient had a PCN reaction causing severe rash involving mucus membranes or skin necrosis: No Has patient had a PCN reaction that required hospitalization No Has patient had a PCN reaction occurring within the last 10 years: No If all of the above answers are "NO", then may proceed with Cephalosporin use.   Statins Other (See Comments)   Muscle weakness   Sulfa Antibiotics Rash   At mouth   Sulfasalazine Rash   At mouth   Tetracycline  Rash   Tetracyclines & Related Rash      Medication List    TAKE these medications   acetaminophen 500 MG tablet Commonly known as:  TYLENOL Take 1,000 mg by mouth 2 (two) times daily.   ADVAIR DISKUS 250-50 MCG/DOSE Aepb Generic drug:  Fluticasone-Salmeterol INHALE 1 PUFF BY MOUTH TWICE DAILY   amitriptyline 25 MG tablet Commonly known as:  ELAVIL Take 25 mg by mouth at bedtime   apixaban 5 MG Tabs tablet Commonly known as:  ELIQUIS Take 1 tablet (5 mg total) by mouth 2 (two) times daily.   ATROVENT HFA 17 MCG/ACT inhaler Generic drug:  ipratropium inhale 2 puffs INTO THE  LUNGS every 6 hours if needed for wheezing   b complex vitamins tablet Take 1 tablet by mouth daily.   cefdinir 300 MG capsule Commonly known as:  OMNICEF Take 1 capsule (300 mg total) by mouth 2 (two) times daily for 8 days.   clopidogrel 75 MG tablet Commonly known as:  PLAVIX Take 1 tablet (75 mg total) by mouth daily with breakfast. Start taking on:  04/02/2018   CoQ-10 200 MG Caps Take 200 mg by mouth daily.   ezetimibe 10 MG tablet Commonly known as:  ZETIA TAKE 1 TABLET BY MOUTH ONCE DAILY   ferrous sulfate 325 (65 FE) MG tablet Take 1 tablet (325 mg total) by mouth 2 (two) times daily with a meal.   folic acid 1 MG tablet Commonly known as:  FOLVITE Take 1 tablet (1 mg total) by mouth daily.   furosemide 40 MG tablet Commonly known as:  LASIX Take 1 tablet (40 mg total) by mouth daily.   HAIR/SKIN/NAILS PO Take 1 tablet by mouth daily.   hydrocortisone 2.5 % cream Apply to VULVA at bedtime as needed with miconazole for lichen planus   ketoconazole 2 % cream Commonly known as:  NIZORAL Apply 1 application topically daily as needed (for crack bleeding fingers.).   loratadine 10 MG tablet Commonly known as:  CLARITIN Take 10 mg by mouth daily.   meclizine 25 MG tablet Commonly known as:  ANTIVERT Take 25 mg by mouth 3 (three) times daily as needed for dizziness.    miconazole 2 % cream Commonly known as:  MICOTIN Apply 1 application topically at bedtime as needed (with hydrocortisone for lichen planus).   nitroGLYCERIN 0.4 MG SL tablet Commonly known as:  NITROSTAT Place 1 tablet (0.4 mg total) under the tongue every 5 (five) minutes as needed for chest pain.   pantoprazole 40 MG tablet Commonly known as:  PROTONIX Take 1 tablet (40 mg total) by mouth 2 (two) times daily.   polyethylene glycol powder powder Commonly known as:  GLYCOLAX/MIRALAX Take 8.5 g by mouth daily.   ramipril 5 MG capsule Commonly known as:  ALTACE Take 1 capsule (5 mg total) by mouth daily.   spironolactone 25 MG tablet Commonly known as:  ALDACTONE Take 1 tablet (25 mg total) by mouth every morning.   traMADol 50 MG tablet Commonly known as:  ULTRAM Take 1 tablet (50 mg total) by mouth daily as needed for severe pain.   traZODone 150 MG tablet Commonly known as:  DESYREL TAKE 1 AND 2/3 TO 2 TABLETS BY MOUTH AT BEDTIME AS DIRECTED What changed:  See the new instructions.   verapamil 180 MG CR tablet Commonly known as:  CALAN-SR take 1 tablet by mouth twice a day   vitamin B-12 1000 MCG tablet Commonly known as:  CYANOCOBALAMIN Take 1,000 mcg by mouth daily.   vitamin C 500 MG tablet Commonly known as:  ASCORBIC ACID Take 500 mg by mouth at bedtime.   Vitamin D3 2000 units Tabs Take 2,000 Units by mouth 2 (two) times a week.         Today   CHIEF COMPLAINT:  Patient is doing very well this morning.  Ambulated with physical therapy without shortness of breath or chest pain   VITAL SIGNS:  Blood pressure 118/61, pulse 66, temperature 97.9 F (36.6 C), temperature source Oral, resp. rate 17, height 5\' 3"  (1.6 m), weight 79.9 kg (176 lb 1.6 oz), SpO2 96 %.   REVIEW OF SYSTEMS:  Review of Systems  Constitutional: Negative.  Negative for chills, fever and malaise/fatigue.  HENT: Negative.  Negative for ear discharge, ear pain, hearing loss,  nosebleeds and sore throat.   Eyes: Negative.  Negative for blurred vision and pain.  Respiratory: Negative.  Negative for cough, hemoptysis, shortness of breath and wheezing.   Cardiovascular: Negative.  Negative for chest pain, palpitations and leg swelling.  Gastrointestinal: Negative.  Negative for abdominal pain, blood in stool, diarrhea, nausea and vomiting.  Genitourinary: Negative.  Negative for dysuria.  Musculoskeletal: Negative.  Negative for back pain.  Skin: Negative.   Neurological: Negative for dizziness, tremors, speech change, focal weakness, seizures and headaches.  Endo/Heme/Allergies: Negative.  Does not bruise/bleed easily.  Psychiatric/Behavioral: Negative.  Negative for depression, hallucinations and suicidal ideas.     PHYSICAL EXAMINATION:  GENERAL:  82 y.o.-year-old patient lying in the bed with no acute distress.  NECK:  Supple, no jugular venous distention. No thyroid enlargement, no tenderness.  LUNGS: Normal breath sounds bilaterally, no wheezing, rales,rhonchi  No use of accessory muscles of respiration.  CARDIOVASCULAR: S1, S2 normal. No murmurs, rubs, or gallops.  ABDOMEN: Soft, non-tender, non-distended. Bowel sounds present. No organomegaly or mass.  EXTREMITIES: No pedal edema, cyanosis, or clubbing.  PSYCHIATRIC: The patient is alert and oriented x 3.  SKIN: No obvious rash, lesion, or ulcer.   DATA REVIEW:   CBC Recent Labs  Lab 04/01/18 0441  WBC 5.9  HGB 10.6*  HCT 30.9*  PLT 267    Chemistries  Recent Labs  Lab 03/30/18 0401  04/01/18 0441  NA 132*   < > 135  K 3.9   < > 3.8  CL 100*   < > 99  CO2 23   < > 25  GLUCOSE 125*   < > 108*  BUN 19   < > 17  CREATININE 0.91   < > 1.00  CALCIUM 8.9   < > 9.0  MG 2.1  --   --    < > = values in this interval not displayed.    Cardiac Enzymes Recent Labs  Lab 03/26/18 0156 03/26/18 0730 03/26/18 1336  TROPONINI 0.09* 0.07* 0.07*    Microbiology Results   @MICRORSLT48 @  RADIOLOGY:  No results found.    Allergies as of 04/01/2018      Reactions   Beta Adrenergic Blockers Shortness Of Breath   DEPRESSION/HYPOTENSION   Brilinta [ticagrelor] Shortness Of Breath   Caused AFIB   Albuterol    rigors   Antihistamines, Diphenhydramine-type    Muscle spasms   Aspirin Other (See Comments)   Heartburn    Nsaids Other (See Comments)   ULCER HISTORY & CURRENTLY ON BLOOD THINNERS   Penicillins Hives, Other (See Comments)   Has patient had a PCN reaction causing immediate rash, facial/tongue/throat swelling, SOB or lightheadedness with hypotension: No Has patient had a PCN reaction causing severe rash involving mucus membranes or skin necrosis: No Has patient had a PCN reaction that required hospitalization No Has patient had a PCN reaction occurring within the last 10 years: No If all of the above answers are "NO", then may proceed with Cephalosporin use.   Statins Other (See Comments)   Muscle weakness   Sulfa Antibiotics Rash   At mouth   Sulfasalazine Rash   At mouth   Tetracycline Rash   Tetracyclines & Related Rash      Medication List    TAKE these medications   acetaminophen 500 MG tablet Commonly  known as:  TYLENOL Take 1,000 mg by mouth 2 (two) times daily.   ADVAIR DISKUS 250-50 MCG/DOSE Aepb Generic drug:  Fluticasone-Salmeterol INHALE 1 PUFF BY MOUTH TWICE DAILY   amitriptyline 25 MG tablet Commonly known as:  ELAVIL Take 25 mg by mouth at bedtime   apixaban 5 MG Tabs tablet Commonly known as:  ELIQUIS Take 1 tablet (5 mg total) by mouth 2 (two) times daily.   ATROVENT HFA 17 MCG/ACT inhaler Generic drug:  ipratropium inhale 2 puffs INTO THE LUNGS every 6 hours if needed for wheezing   b complex vitamins tablet Take 1 tablet by mouth daily.   cefdinir 300 MG capsule Commonly known as:  OMNICEF Take 1 capsule (300 mg total) by mouth 2 (two) times daily for 8 days.   clopidogrel 75 MG tablet Commonly  known as:  PLAVIX Take 1 tablet (75 mg total) by mouth daily with breakfast. Start taking on:  04/02/2018   CoQ-10 200 MG Caps Take 200 mg by mouth daily.   ezetimibe 10 MG tablet Commonly known as:  ZETIA TAKE 1 TABLET BY MOUTH ONCE DAILY   ferrous sulfate 325 (65 FE) MG tablet Take 1 tablet (325 mg total) by mouth 2 (two) times daily with a meal.   folic acid 1 MG tablet Commonly known as:  FOLVITE Take 1 tablet (1 mg total) by mouth daily.   furosemide 40 MG tablet Commonly known as:  LASIX Take 1 tablet (40 mg total) by mouth daily.   HAIR/SKIN/NAILS PO Take 1 tablet by mouth daily.   hydrocortisone 2.5 % cream Apply to VULVA at bedtime as needed with miconazole for lichen planus   ketoconazole 2 % cream Commonly known as:  NIZORAL Apply 1 application topically daily as needed (for crack bleeding fingers.).   loratadine 10 MG tablet Commonly known as:  CLARITIN Take 10 mg by mouth daily.   meclizine 25 MG tablet Commonly known as:  ANTIVERT Take 25 mg by mouth 3 (three) times daily as needed for dizziness.   miconazole 2 % cream Commonly known as:  MICOTIN Apply 1 application topically at bedtime as needed (with hydrocortisone for lichen planus).   nitroGLYCERIN 0.4 MG SL tablet Commonly known as:  NITROSTAT Place 1 tablet (0.4 mg total) under the tongue every 5 (five) minutes as needed for chest pain.   pantoprazole 40 MG tablet Commonly known as:  PROTONIX Take 1 tablet (40 mg total) by mouth 2 (two) times daily.   polyethylene glycol powder powder Commonly known as:  GLYCOLAX/MIRALAX Take 8.5 g by mouth daily.   ramipril 5 MG capsule Commonly known as:  ALTACE Take 1 capsule (5 mg total) by mouth daily.   spironolactone 25 MG tablet Commonly known as:  ALDACTONE Take 1 tablet (25 mg total) by mouth every morning.   traMADol 50 MG tablet Commonly known as:  ULTRAM Take 1 tablet (50 mg total) by mouth daily as needed for severe pain.   traZODone  150 MG tablet Commonly known as:  DESYREL TAKE 1 AND 2/3 TO 2 TABLETS BY MOUTH AT BEDTIME AS DIRECTED What changed:  See the new instructions.   verapamil 180 MG CR tablet Commonly known as:  CALAN-SR take 1 tablet by mouth twice a day   vitamin B-12 1000 MCG tablet Commonly known as:  CYANOCOBALAMIN Take 1,000 mcg by mouth daily.   vitamin C 500 MG tablet Commonly known as:  ASCORBIC ACID Take 500 mg by mouth at bedtime.  Vitamin D3 2000 units Tabs Take 2,000 Units by mouth 2 (two) times a week.        Aspirin prescribed at discharge?  Yes High Intensity Statin Prescribed? (Lipitor 40-80mg  or Crestor 20-40mg ): Yes Beta Blocker Prescribed? Yes For EF <40%, was ACEI/ARB Prescribed? Yes ADP Receptor Inhibitor Prescribed? (i.e. Plavix etc.-Includes Medically Managed Patients): Yes For EF <40%, Aldosterone Inhibitor Prescribed? No:  Was EF assessed during THIS hospitalization? Yes Was Cardiac Rehab II ordered? (Included Medically managed Patients): Yes   Management plans discussed with the patient and she is in agreement. Stable for discharge Patient should follow up with cardiology  CODE STATUS:     Code Status Orders  (From admission, onward)        Start     Ordered   03/26/18 0130  Do not attempt resuscitation (DNR)  Continuous    Question Answer Comment  In the event of cardiac or respiratory ARREST Do not call a "code blue"   In the event of cardiac or respiratory ARREST Do not perform Intubation, CPR, defibrillation or ACLS   In the event of cardiac or respiratory ARREST Use medication by any route, position, wound care, and other measures to relive pain and suffering. May use oxygen, suction and manual treatment of airway obstruction as needed for comfort.      03/26/18 0129    Code Status History    Date Active Date Inactive Code Status Order ID Comments User Context   03/23/2018 0912 03/23/2018 1801 DNR [id number]  Bettey Costa, MD Inpatient   03/21/2018  2157 03/23/2018 0912 Full Code [id number]  Gladstone Lighter, MD Inpatient   02/16/2018 1039 02/18/2018 1823 DNR [id number]  Vaughan Basta, MD Inpatient   03/12/2017 2113 03/13/2017 1834 Full Code [id number]  Gladstone Lighter, MD Inpatient   10/08/2016 1834 10/10/2016 1526 Full Code [id number]  [name], MD ED   06/07/2016 1022 06/07/2016 1724 Full Code [id number]  Nicholes Mango, MD Inpatient   06/06/2016 2211 06/07/2016 1021 DNR [id number]  Max Sane, MD ED   06/01/2016 0019 06/01/2016 1356 Full Code [id number]  Lance Coon, MD Inpatient    Advance Directive Documentation     Most Recent Value  Type of Advance Directive  Healthcare Power of Attorney, Living will, Out of facility DNR (pink MOST or yellow form)  Pre-existing out of facility DNR order (yellow form or pink MOST form)  Yellow form placed in chart (order not valid for inpatient use)  "MOST" Form in Place?  -      TOTAL TIME TAKING CARE OF THIS PATIENT: 38 minutes.    Note: This dictation was prepared with Dragon dictation along with smaller phrase technology. Any transcriptional errors that result from this process are unintentional.  Srinika Delone M.D on 04/01/2018 at 9:23 AM  Between 7am to 6pm - Pager - [phone] After 6pm go to [email or web address] - password [email or web address]  Office  [phone]  CC: Primary care physician; [name], MD

## 2018-04-02 ENCOUNTER — Inpatient Hospital Stay: Payer: Medicare Other | Attending: Obstetrics and Gynecology

## 2018-04-02 NOTE — Telephone Encounter (Signed)
Patient contacted regarding discharge from [facility] on 04/01/18.   Patient understands to follow up with provider ? On 04/09/18 at 3:20pm at [place].  Patient understands discharge instructions? Yes  Patient understands medications and regiment? Yes Patient understands to bring all medications to this visit? Yes

## 2018-04-03 DIAGNOSIS — I5022 Chronic systolic (congestive) heart failure: Secondary | ICD-10-CM | POA: Diagnosis not present

## 2018-04-03 DIAGNOSIS — I482 Chronic atrial fibrillation: Secondary | ICD-10-CM | POA: Diagnosis not present

## 2018-04-03 DIAGNOSIS — M4800 Spinal stenosis, site unspecified: Secondary | ICD-10-CM | POA: Diagnosis not present

## 2018-04-03 DIAGNOSIS — I25119 Atherosclerotic heart disease of native coronary artery with unspecified angina pectoris: Secondary | ICD-10-CM | POA: Diagnosis not present

## 2018-04-03 DIAGNOSIS — F39 Unspecified mood [affective] disorder: Secondary | ICD-10-CM | POA: Diagnosis not present

## 2018-04-03 DIAGNOSIS — K219 Gastro-esophageal reflux disease without esophagitis: Secondary | ICD-10-CM

## 2018-04-06 NOTE — Progress Notes (Signed)
Cardiology Office Note  Date:  04/09/2018   ID:  [name], MD, DOB Jul 11, 1936, MRN [id number]  PCP:  [name], MD   Chief Complaint  Patient presents with  . OTHER    Hospital F/u CHF and cardiac cath c/o sob. Meds reviewed verbally with pt.    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 female atrial fibrillation accompanied by bradycardia.  coronary artery disease  prior bypass surgery stent to  graft 8/17  Echo EF 1/18 with EF 60-65%   moderate to severe left atrial enlargement cardioversion that did not hold,  reverted to atrial fibrillation.  Who presents for routine follow-up of her chronic stable angina, atrial fibrillation   Recent hospital admission for acute on chronic diastolic and systolic CHF Hospital records reviewed with the patient in detail Cath 03/31/2018 .  Significant underlying three-vessel coronary artery disease with patent grafts including LIMA to LAD, SVG to RCA, SVG to OM and SVG to diagonal.  Patent stent in the SVG to RCA.  Severe new stenosis and SVG to diagonal is likely the culprit. 2.  Left ventricular angiography was not performed. 3.  High normal left ventricular end-diastolic pressure at 12 mmHg. 4.  Successful direct stenting of SVG to diagonal using a distal protection device.  Aggressive diuresis in the hospital In follow-up today reports that she is slowly getting her strength back,  Continues on Lasix 40 mg daily, lower dose ramipril, iron She is not taking potassium Denies significant leg swelling,  Reports weight is down 4 pounds from hospital discharge  Echo from the hospital reviewed with her Prior to cardiac catheterization and stent placement ejection fraction was in the range of 30% to 35%. Hypokinesis of the anteroseptal myocardium, apical,  anterior myocardium.  - Mitral valve: There was mild regurgitation. - Left atrium: The atrium was mildly to moderately dilated. - Right ventricle: Systolic function was moderately to    severely reduced. - Right atrium: The atrium was mildly dilated.  patent foramen ovale. - Tricuspid valve: moderate-severe regurgitation. - Pulmonary arteries: Systolic pressure was moderate to severely elevated PA peak pressure: 58 mm Hg (S).   long history of Statin intolerance, tried 4 different medications, had severe myalgias,  Lipitor,crestor, simvastatin all had severe side effects  on Zetia, tolerating this Cholesterol still above goal, LDL 112 on Zetia alone  History of bleeding from hemorrhoids. Stable recently  Episode of amnesia 03/12/2017 Workup including EEG No further episodes since that time  Carotid ultrasound reviewed with her in detail Moderate atherosclerotic plaque over the left carotid bulb   EKG personally reviewed by myself on todays visit Shows atrial fibrillation rate 85 bpm T-wave abnormality anterior precordial leads 1 and aVL   PMH:   has a past medical history of Allergy, Arthritis, Asthma, CAD (coronary artery disease), Cancer ([place]), Carotid arterial disease ([place]), Chronic kidney disease, Colon polyps, Depression, GERD (gastroesophageal reflux disease), Headache, Heart murmur, History of chicken pox, History of hiatal hernia, migraines, UTI (urinary tract infection), Hyperlipidemia, Hypertension, Hypertensive heart disease, Lichen planus, Melanoma ([place]), Palpitations, PMR (polymyalgia rheumatica) ([place]), PSVT (paroxysmal supraventricular tachycardia) ([place]), PUD (peptic ulcer disease), Raynaud's phenomenon, Spinal stenosis, Urine incontinence, and Vitamin D deficiency.  PSH:    Past Surgical History:  Procedure Laterality Date  . ADENOIDECTOMY     age 85  . BACK SURGERY     L3-L5  . BILATERAL CARPAL TUNNEL RELEASE    . BREAST BIOPSY Right    bx x 3  neg  . BREAST SURGERY Right    biopsy x 3 (all benign)  . CARDIAC CATHETERIZATION N/A 06/01/2016   Procedure: LEFT HEART CATH AND CORS/GRAFTS ANGIOGRAPHY;  Surgeon: Wellington Hampshire, MD;  Location: [facility];  Service: Cardiovascular;  Laterality: N/A;  . CARDIAC CATHETERIZATION N/A 06/01/2016   Procedure: Coronary Stent Intervention;  Surgeon: Wellington Hampshire, MD;  Location: [facility];  Service: Cardiovascular;  Laterality: N/A;  . CARDIOVERSION N/A 03/08/2017   Procedure: CARDIOVERSION;  Surgeon: Wellington Hampshire, MD;  Location: [facility];  Service: Cardiovascular;  Laterality: N/A;  . CATARACT EXTRACTION W/PHACO Right 01/04/2016   Procedure: CATARACT EXTRACTION PHACO AND INTRAOCULAR LENS PLACEMENT ([place]);  Surgeon: Leandrew Koyanagi, MD;  Location: [facility];  Service: Ophthalmology;  Laterality: Right;  MALYUGIN  . CATARACT EXTRACTION W/PHACO Left 01/25/2016   Procedure: CATARACT EXTRACTION PHACO AND INTRAOCULAR LENS PLACEMENT ([place]) left eye;  Surgeon: Leandrew Koyanagi, MD;  Location: [facility];  Service: Ophthalmology;  Laterality: Left;  MALYUGIN SHUGARCAINE  . CHOLECYSTECTOMY  90's  . COLONOSCOPY WITH PROPOFOL N/A 05/03/2015   Procedure: COLONOSCOPY WITH PROPOFOL;  Surgeon: Lollie Sails, MD;  Location: [facility];  Service: Endoscopy;  Laterality: N/A;  . CORONARY ARTERY BYPASS GRAFT  98   4 vessel  . CORONARY STENT INTERVENTION N/A 03/31/2018   Procedure: CORONARY STENT INTERVENTION;  Surgeon: Wellington Hampshire, MD;  Location: [facility];  Service: Cardiovascular;  Laterality: N/A;  . ESOPHAGOGASTRODUODENOSCOPY N/A 03/22/2015   Procedure: ESOPHAGOGASTRODUODENOSCOPY (EGD);  Surgeon: Lollie Sails, MD;  Location[facility];  Service: Endoscopy;  Laterality: N/A;  . HEMORRHOID BANDING    . HEMORRHOID SURGERY N/A 02/17/2018   Procedure: HEMORRHOIDECTOMY;  Surgeon: Robert Bellow, MD;  Location: [facility];  Service: General;  Laterality: N/A;  . JOINT REPLACEMENT     BILATERAL KNEE REPLACEMENTS  . KNEE ARTHROSCOPY W/ OATS PROCEDURE     Lt knee (9/01), Rt knee (3/11), Lt hip (5/10)  . LEFT HEART CATH AND CORONARY  ANGIOGRAPHY N/A 03/31/2018   Procedure: LEFT HEART CATH AND CORONARY ANGIOGRAPHY;  Surgeon: Wellington Hampshire, MD;  Location: [place];  Service: Cardiovascular;  Laterality: N/A;  . TOTAL HIP ARTHROPLASTY      Current Outpatient Medications  Medication Sig Dispense Refill  . acetaminophen (TYLENOL) 500 MG tablet Take 1,000 mg by mouth 2 (two) times daily.     [name] ADVAIR DISKUS 250-50 MCG/DOSE AEPB INHALE 1 PUFF BY MOUTH TWICE DAILY 14 each 0  . amitriptyline (ELAVIL) 25 MG tablet Take 25 mg by mouth at bedtime 60 tablet 5  . apixaban (ELIQUIS) 5 MG TABS tablet Take 1 tablet (5 mg total) by mouth 2 (two) times daily. 60 tablet 0  . ATROVENT HFA 17 MCG/ACT inhaler inhale 2 puffs INTO THE LUNGS every 6 hours if needed for wheezing 12.9 g 2  . b complex vitamins tablet Take 1 tablet by mouth daily.    . Cholecalciferol (VITAMIN D3) 2000 UNITS TABS Take 2,000 Units by mouth 2 (two) times a week.     . clopidogrel (PLAVIX) 75 MG tablet Take 1 tablet (75 mg total) by mouth daily with breakfast. 30 tablet 0  . Coenzyme Q10 (COQ-10) 200 MG CAPS Take 200 mg by mouth daily.     [name] ezetimibe (ZETIA) 10 MG tablet TAKE 1 TABLET BY MOUTH ONCE DAILY 30 tablet 2  . ferrous sulfate 325 (65 FE) MG tablet Take 1  tablet (325 mg total) by mouth 2 (two) times daily with a meal. 60 tablet 3  . folic acid (FOLVITE) 1 MG tablet Take 1 tablet (1 mg total) by mouth daily. 30 tablet 5  . furosemide (LASIX) 40 MG tablet Take 1 tablet (40 mg total) by mouth daily. 30 tablet 0  . hydrocortisone 2.5 % cream Apply to VULVA at bedtime as needed with miconazole for lichen planus 28 g 0  . ketoconazole (NIZORAL) 2 % cream Apply 1 application topically daily as needed (for crack bleeding fingers.).    [name] loratadine (CLARITIN) 10 MG tablet Take 10 mg by mouth daily.    . meclizine (ANTIVERT) 25 MG tablet Take 25 mg by mouth 3 (three) times daily as needed for dizziness.    . miconazole (MICOTIN) 2 % cream Apply 1  application topically at bedtime as needed (with hydrocortisone for lichen planus).     . Multiple Vitamins-Minerals (HAIR/SKIN/NAILS PO) Take 1 tablet by mouth daily.    . nitroGLYCERIN (NITROSTAT) 0.4 MG SL tablet Place 1 tablet (0.4 mg total) under the tongue every 5 (five) minutes as needed for chest pain. 30 tablet 0  . pantoprazole (PROTONIX) 40 MG tablet Take 1 tablet (40 mg total) by mouth 2 (two) times daily. 180 tablet 1  . polyethylene glycol powder (GLYCOLAX/MIRALAX) powder Take 8.5 g by mouth daily.     . ramipril (ALTACE) 5 MG capsule Take 1 capsule (5 mg total) by mouth daily. 30 capsule 0  . spironolactone (ALDACTONE) 25 MG tablet Take 1 tablet (25 mg total) by mouth every morning. 90 tablet 1  . traZODone (DESYREL) 150 MG tablet TAKE 1 AND 2/3 TO 2 TABLETS BY MOUTH AT BEDTIME AS DIRECTED (Patient taking differently: Take 250 mg by mouth at bedtime (1 and 2/3 tablets)) 180 tablet 2  . verapamil (CALAN-SR) 180 MG CR tablet take 1 tablet by mouth twice a day 60 tablet 5  . vitamin B-12 (CYANOCOBALAMIN) 1000 MCG tablet Take 1,000 mcg by mouth daily.    . vitamin C (ASCORBIC ACID) 500 MG tablet Take 500 mg by mouth at bedtime.     No current facility-administered medications for this visit.      Allergies:   Beta adrenergic blockers; Brilinta [ticagrelor]; Albuterol; Antihistamines, diphenhydramine-type; Aspirin; Nsaids; Penicillins; Statins; Sulfa antibiotics; Sulfasalazine; Tetracycline; and Tetracyclines & related   Social History:  The patient  reports that she has quit smoking. She has never used smokeless tobacco. She reports that she does not drink alcohol or use drugs.   Family History:   family history includes Alcohol abuse in her father; Depression in her daughter, father, maternal grandmother, and son; Heart disease in her father; Hypertension in her maternal grandfather and paternal grandfather; Lung cancer in her sister; Seizures in her son; Stroke in her maternal  grandmother; Thyroid disease in her daughter and mother.    Review of Systems: Review of Systems  Constitutional: Negative.   Respiratory: Negative.   Cardiovascular: Negative.   Gastrointestinal: Negative.   Musculoskeletal: Negative.   Neurological: Negative.   Psychiatric/Behavioral: Negative.   All other systems reviewed and are negative.    PHYSICAL EXAM: VS:  BP 110/68 (BP Location: Left Arm, Patient Position: Sitting, Cuff Size: Normal)   Pulse 85   Ht 5\' 3"  (1.6 m)   Wt 180 lb 12 oz (82 kg)   BMI 32.02 kg/m  , BMI Body mass index is 32.02 kg/m. Constitutional:  oriented to person, place, and time. No  distress. appears pale,  with a walker HENT:  Head: Normocephalic and atraumatic.  Eyes:  no discharge. No scleral icterus.  Neck: Normal range of motion. Neck supple. No JVD present.  Cardiovascular: Ireg irreg, normal heart sounds and intact distal pulses. Exam reveals no gallop and no friction rub. Trace nonpitting leg edema left leg greater than right compression hose in place No murmur heard. Pulmonary/Chest: Effort normal and breath sounds normal. No stridor. No respiratory distress.  no wheezes.  no rales.  no tenderness.  Abdominal: Soft.  no distension.  no tenderness.  Musculoskeletal: Normal range of motion.  no  tenderness or deformity.  Neurological:  normal muscle tone. Coordination normal. No atrophy Skin: Skin is warm and dry. No rash noted. not diaphoretic.  Psychiatric:  normal mood and affect. behavior is normal. Thought content normal.    Recent Labs: 03/21/2018: ALT 17 03/25/2018: B Natriuretic Peptide 1,912.0 03/30/2018: Magnesium 2.1; TSH 2.962 04/01/2018: BUN 17; Creatinine, Ser 1.00; Hemoglobin 10.6; Platelets 267; Potassium 3.8; Sodium 135    Lipid Panel Lab Results  Component Value Date   CHOL 189 01/14/2018   HDL 62.30 01/14/2018   LDLCALC 112 (H) 01/14/2018   TRIG 77.0 01/14/2018      Wt Readings from Last 3 Encounters:  04/09/18  180 lb 12 oz (82 kg)  04/01/18 176 lb 1.6 oz (79.9 kg)  03/21/18 182 lb 4.8 oz (82.7 kg)       ASSESSMENT AND PLAN:  Persistent atrial fibrillation (HCC) - Plan: EKG 12-Lead Rate well controlled on verapamil Tolerating anticoagulation No changes to her medications. Previously did not want to stop her verapamil. She does not want beta blockers given previous side effects On eliquis  Coronary artery disease of native artery of native heart with stable angina pectoris ([place]) unable to tolerate aspirin. Tolerating Plavix Recent stent to vein graft to the diagonal vessel, feels better  Essential hypertension, benign Blood pressure is well controlled on today's visit. No changes made to the medications. stable  Hypercholesterolemia Unable to tolerate statins,evere myalgias. Tried 4 different statins She is tolerating Zetia We have placed a pharmacy request for consideration of repatha or praluent Goal LDL less than 70 only 597  Chronic diastolic heart failure (HCC) Weight is stable, monitoring weight daily Nonpitting leg swelling left leg greater than right, exacerbated by a venous insufficiency We'll continue Lasix 40 daily  TGA (transient global amnesia) Unable to exclude TIA Currently on eliquis No further episodes  Carotid stenosis Moderate in severity Periodic ultrasound, will work on more aggressive lipid management  Disposition:   F/U  12 months   Total encounter time more than 45 minutes  Greater than 50% was spent in counseling and coordination of care with the patient    Orders Placed This Encounter  Procedures  . EKG 12-Lead     Signed, Esmond Plants, M.D., Ph.D. 04/09/2018  [place], [place]

## 2018-04-08 ENCOUNTER — Telehealth: Payer: Self-pay

## 2018-04-08 NOTE — Telephone Encounter (Signed)
Transition Care Management Follow-up Telephone Call  How have you been since you were released from the hospital:       Patient feeling well since DC from hospital per nurse from [facility] and patient stated she feels better.   Do you understand why you were in the hospital? yes   Do you understand the discharge instrcutions? yes  Items Reviewed:  Medications reviewed: yes  Allergies reviewed: yes  Dietary changes reviewed: yes  Referrals reviewed: yes   Functional Questionnaire:   Activities of Daily Living (ADLs):   She states they are independent in the following: bathing and hygiene, feeding, continence, grooming, toileting and dressing States they require assistance with the following: ambulation patient needs assistance with walker to walk but able to ambulate freely with walker.   Any transportation issues/concerns?: no   Any patient concerns? no   Confirmed importance and date/time of follow-up visits scheduled: yes   Confirmed with patient if condition begins to worsen call PCP or go to the ER.  Patient was given the Call-a-Nurse line [phone]: yes

## 2018-04-08 NOTE — Telephone Encounter (Signed)
Copied from [place] [id number]. Topic: General - Other >> Apr 08, 2018 11:14 AM Keene Breath wrote: Reason for CRM: Malachy Mood from [facility] called to request a follow up appt. for patient being d/c on 04/09/18.  Would like to know if she can be worked into Dr. Lars Mage schedule in the next one to two weeks for this appt.  Please advise and CB # [phone].

## 2018-04-08 NOTE — Telephone Encounter (Signed)
I can see her at 1:00 on 04/16/18. Thanks

## 2018-04-08 NOTE — Telephone Encounter (Signed)
Will this be a TCM ?

## 2018-04-08 NOTE — Telephone Encounter (Addendum)
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 Needs appointment with in 14 days.

## 2018-04-08 NOTE — Telephone Encounter (Signed)
Where would you like to put patient on schedule?

## 2018-04-09 ENCOUNTER — Encounter: Payer: Self-pay | Admitting: Cardiovascular Disease

## 2018-04-09 ENCOUNTER — Ambulatory Visit (INDEPENDENT_AMBULATORY_CARE_PROVIDER_SITE_OTHER): Payer: Medicare Other | Admitting: Cardiovascular Disease

## 2018-04-09 VITALS — BP 110/68 | HR 85 | Ht 63.0 in | Wt 180.8 lb

## 2018-04-09 DIAGNOSIS — I5033 Acute on chronic diastolic (congestive) heart failure: Secondary | ICD-10-CM

## 2018-04-09 DIAGNOSIS — R0600 Dyspnea, unspecified: Secondary | ICD-10-CM | POA: Diagnosis not present

## 2018-04-09 DIAGNOSIS — I119 Hypertensive heart disease without heart failure: Secondary | ICD-10-CM

## 2018-04-09 DIAGNOSIS — E7800 Pure hypercholesterolemia, unspecified: Secondary | ICD-10-CM

## 2018-04-09 DIAGNOSIS — I25118 Atherosclerotic heart disease of native coronary artery with other forms of angina pectoris: Secondary | ICD-10-CM

## 2018-04-09 DIAGNOSIS — I10 Essential (primary) hypertension: Secondary | ICD-10-CM

## 2018-04-09 MED ORDER — RAMIPRIL 5 MG PO CAPS: 5.0000 mg | ORAL_CAPSULE | Freq: Every day | ORAL | 3 refills | Status: DC

## 2018-04-09 MED ORDER — APIXABAN 5 MG PO TABS: 5.0000 mg | ORAL_TABLET | Freq: Two times a day (BID) | ORAL | 11 refills | Status: DC

## 2018-04-09 MED ORDER — SPIRONOLACTONE 25 MG PO TABS: 25.0000 mg | ORAL_TABLET | Freq: Every morning | ORAL | 3 refills | Status: DC

## 2018-04-09 MED ORDER — EZETIMIBE 10 MG PO TABS: 10.0000 mg | ORAL_TABLET | Freq: Every day | ORAL | 3 refills | Status: DC

## 2018-04-09 MED ORDER — FUROSEMIDE 40 MG PO TABS: 40.0000 mg | ORAL_TABLET | Freq: Every day | ORAL | 3 refills | Status: DC

## 2018-04-09 MED ORDER — NITROGLYCERIN 0.4 MG SL SUBL: 0.4000 mg | SUBLINGUAL_TABLET | SUBLINGUAL | 6 refills | Status: DC | PRN

## 2018-04-09 MED ORDER — CLOPIDOGREL BISULFATE 75 MG PO TABS: 75.0000 mg | ORAL_TABLET | Freq: Every day | ORAL | 3 refills | Status: DC

## 2018-04-09 MED ORDER — VERAPAMIL HCL ER 180 MG PO TBCR: 180.0000 mg | EXTENDED_RELEASE_TABLET | Freq: Two times a day (BID) | ORAL | 4 refills | Status: DC

## 2018-04-09 NOTE — Patient Instructions (Addendum)
We will place a pharmacy request for repatha/praluent   Medication Instructions:   No medication changes made  Labwork:  No new labs needed  Testing/Procedures:  No further testing at this time   Follow-Up: It was a pleasure seeing you in the office today. Please call us if you have new issues that need to be addressed before your next appt.  [phone]  Your physician wants you to follow-up in: 3 months.  You will receive a reminder letter in the mail two months in advance. If you don't receive a letter, please call our office to schedule the follow-up appointment.  If you need a refill on your cardiac medications before your next appointment, please call your pharmacy.  For educational health videos Log in to : [email or web address] Or : [email or web address], password : [email or web address]

## 2018-04-09 NOTE — Telephone Encounter (Signed)
Patient scheduled for 04/16/18

## 2018-04-10 DIAGNOSIS — R2681 Unsteadiness on feet: Secondary | ICD-10-CM | POA: Diagnosis not present

## 2018-04-10 DIAGNOSIS — M6281 Muscle weakness (generalized): Secondary | ICD-10-CM | POA: Diagnosis not present

## 2018-04-10 DIAGNOSIS — R278 Other lack of coordination: Secondary | ICD-10-CM | POA: Diagnosis not present

## 2018-04-10 DIAGNOSIS — Z741 Need for assistance with personal care: Secondary | ICD-10-CM | POA: Diagnosis not present

## 2018-04-14 DIAGNOSIS — Z741 Need for assistance with personal care: Secondary | ICD-10-CM | POA: Diagnosis not present

## 2018-04-14 DIAGNOSIS — M6281 Muscle weakness (generalized): Secondary | ICD-10-CM | POA: Diagnosis not present

## 2018-04-14 DIAGNOSIS — R278 Other lack of coordination: Secondary | ICD-10-CM | POA: Diagnosis not present

## 2018-04-14 DIAGNOSIS — R2681 Unsteadiness on feet: Secondary | ICD-10-CM | POA: Diagnosis not present

## 2018-04-15 DIAGNOSIS — Z741 Need for assistance with personal care: Secondary | ICD-10-CM | POA: Diagnosis not present

## 2018-04-15 DIAGNOSIS — R2681 Unsteadiness on feet: Secondary | ICD-10-CM | POA: Diagnosis not present

## 2018-04-15 DIAGNOSIS — M6281 Muscle weakness (generalized): Secondary | ICD-10-CM | POA: Diagnosis not present

## 2018-04-15 DIAGNOSIS — R278 Other lack of coordination: Secondary | ICD-10-CM | POA: Diagnosis not present

## 2018-04-16 ENCOUNTER — Encounter: Payer: Self-pay | Admitting: Internal Medicine

## 2018-04-16 ENCOUNTER — Ambulatory Visit (INDEPENDENT_AMBULATORY_CARE_PROVIDER_SITE_OTHER): Payer: Medicare Other | Admitting: Internal Medicine

## 2018-04-16 VITALS — BP 118/78 | HR 77 | Temp 98.1°F | Resp 18 | Ht 63.0 in | Wt 178.2 lb

## 2018-04-16 DIAGNOSIS — I481 Persistent atrial fibrillation: Secondary | ICD-10-CM

## 2018-04-16 DIAGNOSIS — I25118 Atherosclerotic heart disease of native coronary artery with other forms of angina pectoris: Secondary | ICD-10-CM

## 2018-04-16 DIAGNOSIS — E7800 Pure hypercholesterolemia, unspecified: Secondary | ICD-10-CM | POA: Diagnosis not present

## 2018-04-16 DIAGNOSIS — D649 Anemia, unspecified: Secondary | ICD-10-CM | POA: Diagnosis not present

## 2018-04-16 DIAGNOSIS — A419 Sepsis, unspecified organism: Secondary | ICD-10-CM | POA: Diagnosis not present

## 2018-04-16 DIAGNOSIS — J4520 Mild intermittent asthma, uncomplicated: Secondary | ICD-10-CM | POA: Diagnosis not present

## 2018-04-16 DIAGNOSIS — I5033 Acute on chronic diastolic (congestive) heart failure: Secondary | ICD-10-CM

## 2018-04-16 DIAGNOSIS — I10 Essential (primary) hypertension: Secondary | ICD-10-CM

## 2018-04-16 DIAGNOSIS — F329 Major depressive disorder, single episode, unspecified: Secondary | ICD-10-CM | POA: Diagnosis not present

## 2018-04-16 DIAGNOSIS — E871 Hypo-osmolality and hyponatremia: Secondary | ICD-10-CM

## 2018-04-16 DIAGNOSIS — K2100 Gastro-esophageal reflux disease with esophagitis, without bleeding: Secondary | ICD-10-CM

## 2018-04-16 DIAGNOSIS — I4819 Other persistent atrial fibrillation: Secondary | ICD-10-CM

## 2018-04-16 DIAGNOSIS — F32A Depression, unspecified: Secondary | ICD-10-CM

## 2018-04-16 LAB — CBC WITH DIFFERENTIAL/PLATELET
BASOS ABS: 0 10*3/uL (ref 0.0–0.1)
Basophils Relative: 0.4 % (ref 0.0–3.0)
EOS ABS: 0 10*3/uL (ref 0.0–0.7)
Eosinophils Relative: 0.7 % (ref 0.0–5.0)
HEMATOCRIT: 33.1 % — AB (ref 36.0–46.0)
HEMOGLOBIN: 11.3 g/dL — AB (ref 12.0–15.0)
Lymphocytes Relative: 10.5 % — ABNORMAL LOW (ref 12.0–46.0)
Lymphs Abs: 0.5 10*3/uL — ABNORMAL LOW (ref 0.7–4.0)
MCHC: 34.1 g/dL (ref 30.0–36.0)
MCV: 97.9 fl (ref 78.0–100.0)
MONOS PCT: 12.7 % — AB (ref 3.0–12.0)
Monocytes Absolute: 0.6 10*3/uL (ref 0.1–1.0)
Neutro Abs: 3.6 10*3/uL (ref 1.4–7.7)
Neutrophils Relative %: 75.7 % (ref 43.0–77.0)
Platelets: 273 10*3/uL (ref 150.0–400.0)
RBC: 3.38 Mil/uL — AB (ref 3.87–5.11)
RDW: 15.8 % — AB (ref 11.5–15.5)
WBC: 4.7 10*3/uL (ref 4.0–10.5)

## 2018-04-16 LAB — BASIC METABOLIC PANEL
BUN: 14 mg/dL (ref 6–23)
CHLORIDE: 98 meq/L (ref 96–112)
CO2: 24 meq/L (ref 19–32)
Calcium: 9.7 mg/dL (ref 8.4–10.5)
Creatinine, Ser: 0.95 mg/dL (ref 0.40–1.20)
GFR: 60.15 mL/min (ref >60.00)
GLUCOSE: 125 mg/dL — AB (ref 70–99)
POTASSIUM: 4.2 meq/L (ref 3.5–5.1)
SODIUM: 132 meq/L — AB (ref 135–145)

## 2018-04-16 LAB — IBC PANEL
Iron: 59 ug/dL (ref 42–145)
SATURATION RATIOS: 15.2 % — AB (ref 20.0–50.0)
TRANSFERRIN: 277 mg/dL (ref 212.0–360.0)

## 2018-04-16 LAB — FERRITIN: FERRITIN: 57.1 ng/mL (ref 10.0–291.0)

## 2018-04-16 NOTE — Progress Notes (Signed)
Patient ID: Charlotte Sessions, MD, female   DOB: [birth year], 82 y.o.   MRN: [id number]   Subjective:    Patient ID: Charlotte Sessions, MD, female    DOB: [birth year], 82 y.o.   MRN: [id number]  HPI  Patient here for hospital follow up.  She was admitted 03/25/18 with acute systolic hear failure with pulmonary hypertension.  Recommended her to continue on lasix, ace inhibitor and aldactone.  She has has persistent afib.  Recommended continuing on eliquis and verapamil.  Also diagnosed with new cardiomyopathy with EF 30-35% with elevated troponein.  Had cardiac cath.  S/p stent placement and recommended to remain on eliquis and plavix for one year.  Also recommended continue omnicef for two weeks after being hospitalized for E.coli bacteremia.  Was also found to be hyponatremic - which improved with lasix.  She was discharged to skilled nursing where she received therapy, etc.  Discharged from [facility] 04/09/18.  Since being home, she has been doing better.  Breathing better.  No chest pain.  No acid reflux.  No abdominal pain.  Bowels moving.  Eating.  Energy improving.     Past Medical History:  Diagnosis Date  . Allergy   . Arthritis    s/p bilateral knees and left hip replacement  . Asthma   . CAD (coronary artery disease)    a. 12/1996 s/p CABG x4 ([place]);  b. 2005 Pt reports stress test & cath, which revealed patent grafts.  . Cancer ([place])    melanoma right arm  . Carotid arterial disease ([place])    a. 04/2015 Carotid U/S: <50% bilat ICA stenosis.  . Chronic kidney disease   . Colon polyps    H/O  . Depression   . GERD (gastroesophageal reflux disease)    h/o hiatal hernia  . Headache    migraines in past  . Heart murmur    a. 04/2011 Echo: EF 55-60%, bilat atrial enlargement, mild to mod TR.  [name] History of chicken pox   . History of hiatal hernia   . Hx of migraines    rare now  . Hx: UTI (urinary tract infection)   . Hyperlipidemia    a. Statin intolerant -->on zetia.  . Hypertension   .  Hypertensive heart disease   . Lichen planus   . Melanoma ([place])   . Palpitations    a. rare PVC's and h/o SVT.  [name] PMR (polymyalgia rheumatica) (HCC)    h/o in setting of crestor usage.  [name] PSVT (paroxysmal supraventricular tachycardia) ([place])   . PUD (peptic ulcer disease)    remote history  . Raynaud's phenomenon   . Spinal stenosis   . Urine incontinence    H/O  . Vitamin D deficiency    Past Surgical History:  Procedure Laterality Date  . ADENOIDECTOMY     age 55  . BACK SURGERY     L3-L5  . BILATERAL CARPAL TUNNEL RELEASE    . BREAST BIOPSY Right    bx x 3 neg  . BREAST SURGERY Right    biopsy x 3 (all benign)  . CARDIAC CATHETERIZATION N/A 06/01/2016   Procedure: LEFT HEART CATH AND CORS/GRAFTS ANGIOGRAPHY;  Surgeon: Wellington Hampshire, MD;  Location: [place] [facility];  Service: Cardiovascular;  Laterality: N/A;  . CARDIAC CATHETERIZATION N/A 06/01/2016   Procedure: Coronary Stent Intervention;  Surgeon: Wellington Hampshire, MD;  Location: [place] [facility];  Service: Cardiovascular;  Laterality: N/A;  . CARDIOVERSION N/A 03/08/2017  Procedure: CARDIOVERSION;  Surgeon: Wellington Hampshire, MD;  Location: [facility];  Service: Cardiovascular;  Laterality: N/A;  . CATARACT EXTRACTION W/PHACO Right 01/04/2016   Procedure: CATARACT EXTRACTION PHACO AND INTRAOCULAR LENS PLACEMENT ([place]);  Surgeon: Leandrew Koyanagi, MD;  Location: [place];  Service: Ophthalmology;  Laterality: Right;  MALYUGIN  . CATARACT EXTRACTION W/PHACO Left 01/25/2016   Procedure: CATARACT EXTRACTION PHACO AND INTRAOCULAR LENS PLACEMENT ([place]) left eye;  Surgeon: Leandrew Koyanagi, MD;  Location: [place];  Service: Ophthalmology;  Laterality: Left;  MALYUGIN SHUGARCAINE  . CHOLECYSTECTOMY  90's  . COLONOSCOPY WITH PROPOFOL N/A 05/03/2015   Procedure: COLONOSCOPY WITH PROPOFOL;  Surgeon: Lollie Sails, MD;  Location: [place] [facility];  Service: Endoscopy;  Laterality: N/A;  . CORONARY  ARTERY BYPASS GRAFT  98   4 vessel  . CORONARY STENT INTERVENTION N/A 03/31/2018   Procedure: CORONARY STENT INTERVENTION;  Surgeon: Wellington Hampshire, MD;  Location: [place] [facility];  Service: Cardiovascular;  Laterality: N/A;  . ESOPHAGOGASTRODUODENOSCOPY N/A 03/22/2015   Procedure: ESOPHAGOGASTRODUODENOSCOPY (EGD);  Surgeon: Lollie Sails, MD;  Location: [facility] ENDOSCOPY;  Service: Endoscopy;  Laterality: N/A;  . HEMORRHOID BANDING    . HEMORRHOID SURGERY N/A 02/17/2018   Procedure: HEMORRHOIDECTOMY;  Surgeon: Robert Bellow, MD;  Location: [facility];  Service: General;  Laterality: N/A;  . JOINT REPLACEMENT     BILATERAL KNEE REPLACEMENTS  . KNEE ARTHROSCOPY W/ OATS PROCEDURE     Lt knee (9/01), Rt knee (3/11), Lt hip (5/10)  . LEFT HEART CATH AND CORONARY ANGIOGRAPHY N/A 03/31/2018   Procedure: LEFT HEART CATH AND CORONARY ANGIOGRAPHY;  Surgeon: Wellington Hampshire, MD;  Location: [place] [facility];  Service: Cardiovascular;  Laterality: N/A;  . TOTAL HIP ARTHROPLASTY     Family History  Problem Relation Age of Onset  . Thyroid disease Mother        graves disease  . Heart disease Father        rheumatic heart  . Alcohol abuse Father   . Depression Father   . Lung cancer Sister   . Depression Daughter   . Thyroid disease Daughter        hashimoto  . Depression Son   . Stroke Maternal Grandmother   . Depression Maternal Grandmother   . Hypertension Maternal Grandfather   . Hypertension Paternal Grandfather   . Seizures Son   . Breast cancer Neg Hx    Social History   Socioeconomic History  . Marital status: Married    Spouse name: Not on file  . Number of children: 3  . Years of education: Not on file  . Highest education level: Not on file  Occupational History  . Occupation: Retired Sport and exercise psychologist  Social Needs  . Financial resource strain: Not on file  . Food insecurity:    Worry: Not on file    Inability: Not on file  . Transportation needs:     Medical: Not on file    Non-medical: Not on file  Tobacco Use  . Smoking status: Former Research scientist (life sciences)  . Smokeless tobacco: Never Used  . Tobacco comment: quit 44+ yrs ago  Substance and Sexual Activity  . Alcohol use: No    Alcohol/week: 0.0 oz  . Drug use: No  . Sexual activity: Never  Lifestyle  . Physical activity:    Days per week: 5 days    Minutes per session: 30 min  . Stress: Not at all  Relationships  . Social  connections:    Talks on phone: Not on file    Gets together: Not on file    Attends religious service: Not on file    Active member of club or organization: Not on file    Attends meetings of clubs or organizations: Not on file    Relationship status: Not on file  Other Topics Concern  . Not on file  Social History Narrative   [place] in [place].  Retired Engineer, drilling.  Relatively active.   Uses a Rollator to ambulate    Outpatient Encounter Medications as of 04/16/2018  Medication Sig  . acetaminophen (TYLENOL) 500 MG tablet Take 1,000 mg by mouth 2 (two) times daily.   [name] ADVAIR DISKUS 250-50 MCG/DOSE AEPB INHALE 1 PUFF BY MOUTH TWICE DAILY  . amitriptyline (ELAVIL) 25 MG tablet Take 25 mg by mouth at bedtime  . apixaban (ELIQUIS) 5 MG TABS tablet Take 1 tablet (5 mg total) by mouth 2 (two) times daily.  . ATROVENT HFA 17 MCG/ACT inhaler inhale 2 puffs INTO THE LUNGS every 6 hours if needed for wheezing  . b complex vitamins tablet Take 1 tablet by mouth daily.  . Cholecalciferol (VITAMIN D3) 2000 UNITS TABS Take 2,000 Units by mouth 2 (two) times a week.   . clopidogrel (PLAVIX) 75 MG tablet Take 1 tablet (75 mg total) by mouth daily with breakfast.  . Coenzyme Q10 (COQ-10) 200 MG CAPS Take 200 mg by mouth daily.   [name] ezetimibe (ZETIA) 10 MG tablet Take 1 tablet (10 mg total) by mouth daily.  . ferrous sulfate 325 (65 FE) MG tablet Take 1 tablet (325 mg total) by mouth 2 (two) times daily with a meal.  . folic acid (FOLVITE) 1 MG tablet Take 1 tablet (1 mg total)  by mouth daily.  . furosemide (LASIX) 40 MG tablet Take 1 tablet (40 mg total) by mouth daily.  . hydrocortisone 2.5 % cream Apply to VULVA at bedtime as needed with miconazole for lichen planus  . ketoconazole (NIZORAL) 2 % cream Apply 1 application topically daily as needed (for crack bleeding fingers.).  [name] loratadine (CLARITIN) 10 MG tablet Take 10 mg by mouth daily.  . meclizine (ANTIVERT) 25 MG tablet Take 25 mg by mouth 3 (three) times daily as needed for dizziness.  . miconazole (MICOTIN) 2 % cream Apply 1 application topically at bedtime as needed (with hydrocortisone for lichen planus).   . Multiple Vitamins-Minerals (HAIR/SKIN/NAILS PO) Take 1 tablet by mouth daily.  . nitroGLYCERIN (NITROSTAT) 0.4 MG SL tablet Place 1 tablet (0.4 mg total) under the tongue every 5 (five) minutes as needed for chest pain.  . pantoprazole (PROTONIX) 40 MG tablet Take 1 tablet (40 mg total) by mouth 2 (two) times daily.  . polyethylene glycol powder (GLYCOLAX/MIRALAX) powder Take 8.5 g by mouth daily.   . ramipril (ALTACE) 5 MG capsule Take 1 capsule (5 mg total) by mouth daily.  [name] spironolactone (ALDACTONE) 25 MG tablet Take 1 tablet (25 mg total) by mouth every morning.  . traZODone (DESYREL) 150 MG tablet TAKE 1 AND 2/3 TO 2 TABLETS BY MOUTH AT BEDTIME AS DIRECTED (Patient taking differently: Take 250 mg by mouth at bedtime (1 and 2/3 tablets))  . verapamil (CALAN-SR) 180 MG CR tablet Take 1 tablet (180 mg total) by mouth 2 (two) times daily.  . vitamin B-12 (CYANOCOBALAMIN) 1000 MCG tablet Take 1,000 mcg by mouth daily.  . vitamin C (ASCORBIC ACID) 500 MG tablet Take 500 mg  by mouth at bedtime.   No facility-administered encounter medications on file as of 04/16/2018.     Review of Systems  Constitutional: Negative for appetite change and unexpected weight change.  HENT: Negative for congestion and sinus pressure.   Respiratory: Negative for cough and chest tightness.        Breathing stable.      Cardiovascular: Negative for chest pain and palpitations.       No increased swelling.    Gastrointestinal: Negative for abdominal pain, diarrhea, nausea and vomiting.  Genitourinary: Negative for difficulty urinating and dysuria.  Musculoskeletal: Negative for joint swelling and myalgias.       Chronic back pain.    Skin: Negative for color change and rash.  Neurological: Negative for dizziness, light-headedness and headaches.  Psychiatric/Behavioral: Negative for agitation and dysphoric mood.       Objective:    Physical Exam  Constitutional: She appears well-developed and well-nourished. No distress.  HENT:  Nose: Nose normal.  Mouth/Throat: Oropharynx is clear and moist.  Neck: Neck supple. No thyromegaly present.  Cardiovascular: Normal rate and regular rhythm.  Pulmonary/Chest: Breath sounds normal. No respiratory distress. She has no wheezes.  Abdominal: Soft. Bowel sounds are normal. There is no tenderness.  Musculoskeletal: She exhibits no edema or tenderness.  Lymphadenopathy:    She has no cervical adenopathy.  Skin: No rash noted. No erythema.  Psychiatric: She has a normal mood and affect. Her behavior is normal.    BP 118/78 (BP Location: Left Arm, Patient Position: Sitting, Cuff Size: Normal)   Pulse 77   Temp 98.1 F (36.7 C) (Oral)   Resp 18   Ht 5\' 3"  (1.6 m)   Wt 178 lb 4 oz (80.9 kg)   SpO2 97%   BMI 31.58 kg/m  Wt Readings from Last 3 Encounters:  04/16/18 178 lb 4 oz (80.9 kg)  04/09/18 180 lb 12 oz (82 kg)  04/01/18 176 lb 1.6 oz (79.9 kg)     Lab Results  Component Value Date   WBC 4.7 04/16/2018   HGB 11.3 (L) 04/16/2018   HCT 33.1 (L) 04/16/2018   PLT 273.0 04/16/2018   GLUCOSE 125 (H) 04/16/2018   CHOL 189 01/14/2018   TRIG 77.0 01/14/2018   HDL 62.30 01/14/2018   LDLDIRECT 126.7 07/03/2013   LDLCALC 112 (H) 01/14/2018   ALT 17 03/21/2018   AST 27 03/21/2018   NA 132 (L) 04/16/2018   K 4.2 04/16/2018   CL 98 04/16/2018    CREATININE 0.95 04/16/2018   BUN 14 04/16/2018   CO2 24 04/16/2018   TSH 2.962 03/30/2018   INR 1.34 03/28/2018   HGBA1C 4.8 03/28/2018       Assessment & Plan:   Problem List Items Addressed This Visit    Acute on chronic diastolic CHF (congestive heart failure) ([place])    Recently admitted and diagnosed with acute on chronic heart failure.  Diagnosed with new cardiomyopathy with EF 30-35%.  Recommended continuing ace inhibitor, lasix and spironolactone.  Since her discharge she is doing better.  Breathing better.  Volume status stable.  Planning to f/u with CHF clinic.  Continue f/u with cardiology.       Anemia - Primary    On iron.  Follow cbc.  Recheck cbc and ferritin today.        Relevant Orders   CBC with Differential/Platelet (Completed)   Ferritin (Completed)   IBC panel (Completed)   Asthma    Breathing stable.  CAD (coronary artery disease)    S/p DES placement with recent hospitalization.  Recommended continuing eliquis and plavix for one year.  Followed by cardiology.  Feels better.        Depression    Stable on current regimen.  Follow.        Essential hypertension, benign    Blood pressure under good control.  Continue same medication regimen.  Follow pressures.  Follow metabolic panel.        GERD (gastroesophageal reflux disease)    Controlled on current regimen.  Follow.        Hypercholesterolemia    On zetia.  Follow lipid panel.        Hyponatremia    Recheck sodium today.        Relevant Orders   Basic metabolic panel (Completed)   Persistent atrial fibrillation ([place])    On eliquis.  Stable.  Followed by cardiology.       Sepsis [facility])    Recently admitted with sepsis.  On omnicef.  Recommended completing a two weeks course of abx.            [name], MD

## 2018-04-17 ENCOUNTER — Other Ambulatory Visit: Payer: Self-pay | Admitting: Internal Medicine

## 2018-04-17 DIAGNOSIS — R278 Other lack of coordination: Secondary | ICD-10-CM | POA: Diagnosis not present

## 2018-04-17 DIAGNOSIS — Z741 Need for assistance with personal care: Secondary | ICD-10-CM | POA: Diagnosis not present

## 2018-04-17 DIAGNOSIS — M6281 Muscle weakness (generalized): Secondary | ICD-10-CM | POA: Diagnosis not present

## 2018-04-17 DIAGNOSIS — R2681 Unsteadiness on feet: Secondary | ICD-10-CM | POA: Diagnosis not present

## 2018-04-17 DIAGNOSIS — E871 Hypo-osmolality and hyponatremia: Secondary | ICD-10-CM

## 2018-04-17 NOTE — Progress Notes (Signed)
Order placed for f/u met b 

## 2018-04-18 DIAGNOSIS — R278 Other lack of coordination: Secondary | ICD-10-CM | POA: Diagnosis not present

## 2018-04-18 DIAGNOSIS — Z741 Need for assistance with personal care: Secondary | ICD-10-CM | POA: Diagnosis not present

## 2018-04-18 DIAGNOSIS — R2681 Unsteadiness on feet: Secondary | ICD-10-CM | POA: Diagnosis not present

## 2018-04-18 DIAGNOSIS — M6281 Muscle weakness (generalized): Secondary | ICD-10-CM | POA: Diagnosis not present

## 2018-04-22 DIAGNOSIS — Z741 Need for assistance with personal care: Secondary | ICD-10-CM | POA: Diagnosis not present

## 2018-04-22 DIAGNOSIS — M6281 Muscle weakness (generalized): Secondary | ICD-10-CM | POA: Diagnosis not present

## 2018-04-22 DIAGNOSIS — R2681 Unsteadiness on feet: Secondary | ICD-10-CM | POA: Diagnosis not present

## 2018-04-22 DIAGNOSIS — R278 Other lack of coordination: Secondary | ICD-10-CM | POA: Diagnosis not present

## 2018-04-23 DIAGNOSIS — J3801 Paralysis of vocal cords and larynx, unilateral: Secondary | ICD-10-CM | POA: Diagnosis not present

## 2018-04-23 DIAGNOSIS — R490 Dysphonia: Secondary | ICD-10-CM | POA: Diagnosis not present

## 2018-04-24 ENCOUNTER — Encounter: Payer: Self-pay | Admitting: Internal Medicine

## 2018-04-24 ENCOUNTER — Ambulatory Visit: Payer: Medicare Other | Admitting: Family

## 2018-04-24 DIAGNOSIS — A419 Sepsis, unspecified organism: Secondary | ICD-10-CM | POA: Insufficient documentation

## 2018-04-24 NOTE — Assessment & Plan Note (Signed)
On iron.  Follow cbc.  Recheck cbc and ferritin today.

## 2018-04-24 NOTE — Assessment & Plan Note (Signed)
S/p DES placement with recent hospitalization.  Recommended continuing eliquis and plavix for one year.  Followed by cardiology.  Feels better.

## 2018-04-24 NOTE — Assessment & Plan Note (Signed)
On eliquis.  Stable.  Followed by cardiology.   

## 2018-04-24 NOTE — Assessment & Plan Note (Signed)
Controlled on current regimen.  Follow.  

## 2018-04-24 NOTE — Assessment & Plan Note (Signed)
Recently admitted and diagnosed with acute on chronic heart failure.  Diagnosed with new cardiomyopathy with EF 30-35%.  Recommended continuing ace inhibitor, lasix and spironolactone.  Since her discharge she is doing better.  Breathing better.  Volume status stable.  Planning to f/u with CHF clinic.  Continue f/u with cardiology.

## 2018-04-24 NOTE — Assessment & Plan Note (Signed)
On zetia.  Follow lipid panel.  

## 2018-04-24 NOTE — Assessment & Plan Note (Signed)
Recheck sodium today.  

## 2018-04-24 NOTE — Assessment & Plan Note (Signed)
Stable on current regimen.  Follow.   

## 2018-04-24 NOTE — Assessment & Plan Note (Signed)
Breathing stable.

## 2018-04-24 NOTE — Assessment & Plan Note (Signed)
Blood pressure under good control.  Continue same medication regimen.  Follow pressures.  Follow metabolic panel.   

## 2018-04-24 NOTE — Assessment & Plan Note (Signed)
Recently admitted with sepsis.  On omnicef.  Recommended completing a two weeks course of abx.

## 2018-04-27 IMAGING — MR MR HEAD W/O CM
10 series · 48 of 48 positions shown · non-contrast
Comparison: Head CT 03/12/2017

CLINICAL DATA: Altered mental status yesterday. Repeating phrases.
Chronic atrial fibrillation.

EXAM:
MRI HEAD WITHOUT CONTRAST
TECHNIQUE: Multiplanar, multiecho pulse sequences of the brain and surrounding
structures were obtained without intravenous contrast.

[Series 2: T1 · sagittal · 5.0mm · 0.45mm/px · 3 of 25 slices shown (1 of 2)]
[im 1/25]
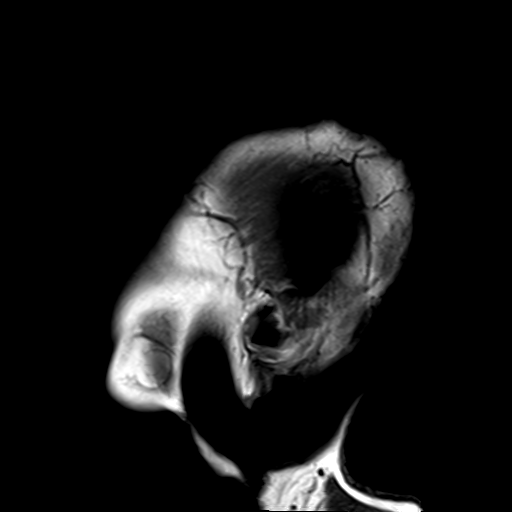
[im 13/25]
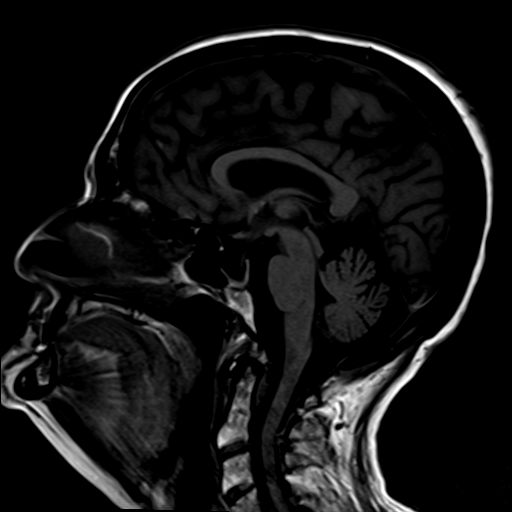
[im 25/25]
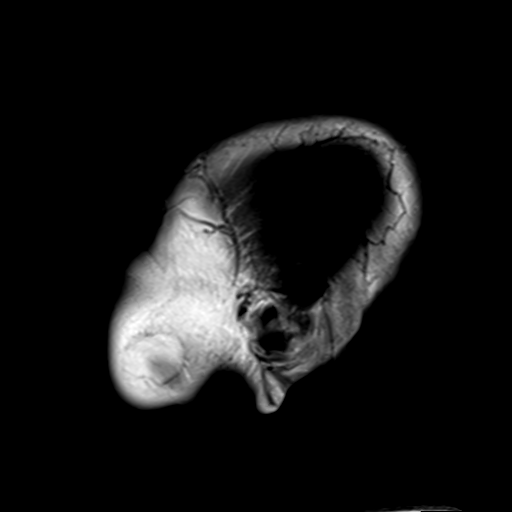

[Series 4: DWI · coronal · 3.0mm · 1.80mm/px · 4 of 45 slices shown (1 of 2)]
[im 1/45]
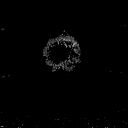
[im 15/45]
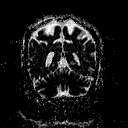
[im 30/45]
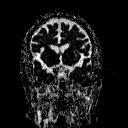
[im 45/45]
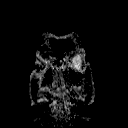

[Series 6: DWI · axial · 3.0mm · 1.80mm/px · z∈[-43,+117]mm · 5 of 50 slices shown (2 of 2)]
[im 1/50]
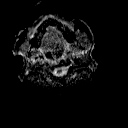
[im 13/50]
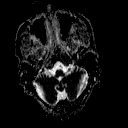
[im 25/50]
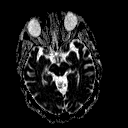
[im 37/50]
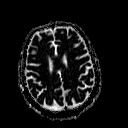
[im 50/50]
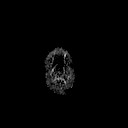

[Series 7: T2 · axial · 5.0mm · 0.60mm/px · z∈[-27,+124]mm · 2 of 25 slices shown (1 of 3)]
[im 1/25]
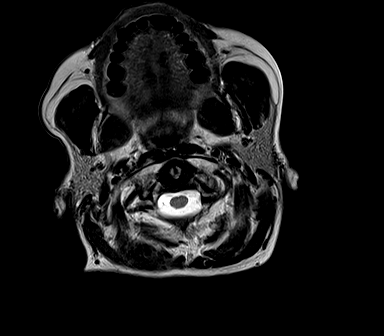
[im 25/25]
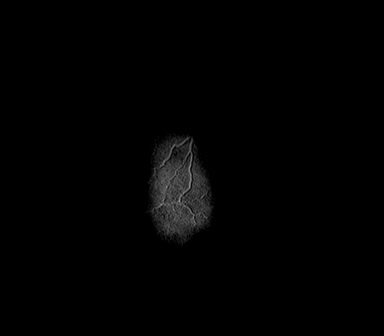

[Series 8: FLAIR · axial · 3.0mm · 0.45mm/px · z∈[-24,+128]mm · 5 of 53 slices shown]
[im 1/53]
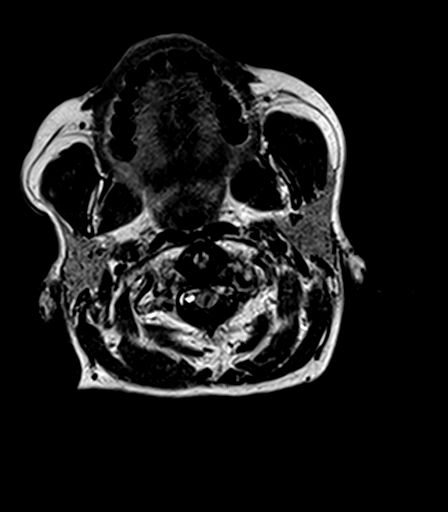
[im 14/53]
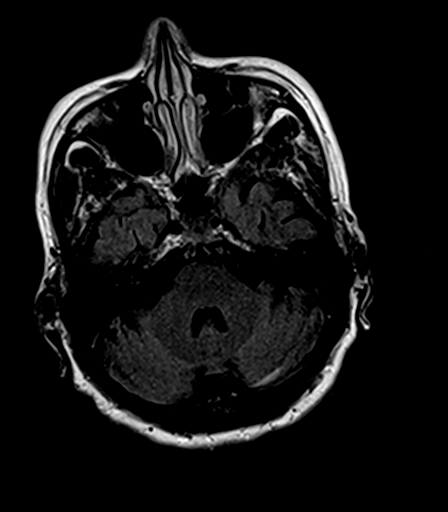
[im 27/53]
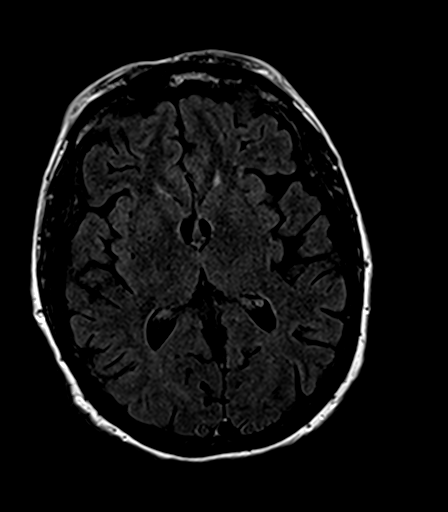
[im 40/53]
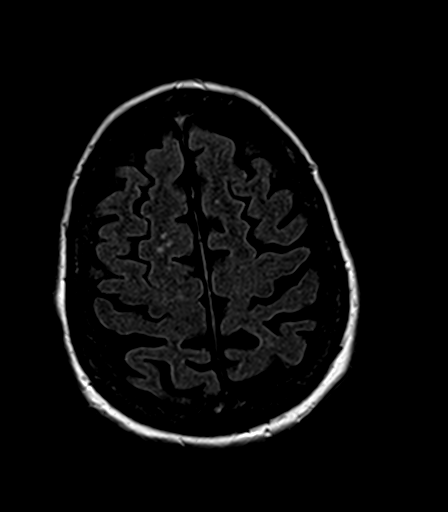
[im 53/53]
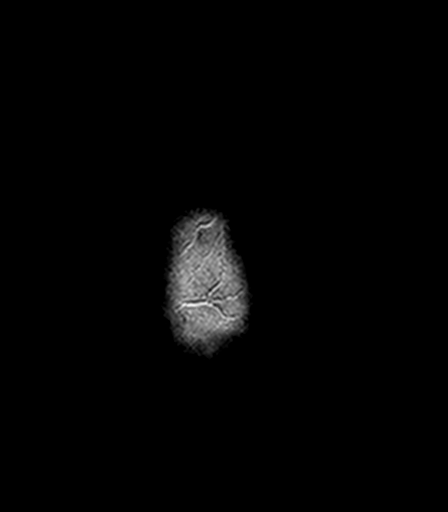

[Series 9: T2 · axial · 5.0mm · 0.45mm/px · z∈[-24,+128]mm · 2 of 25 slices shown (2 of 3)]
[im 1/25]
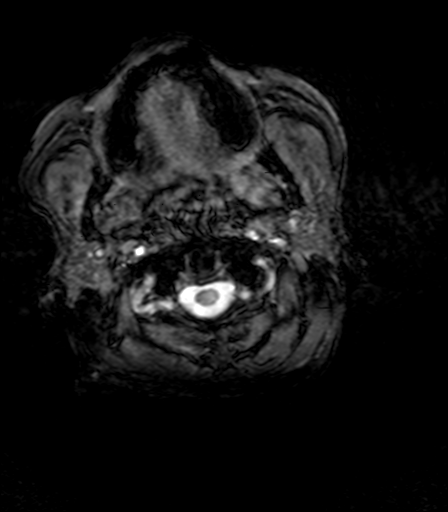
[im 25/25]
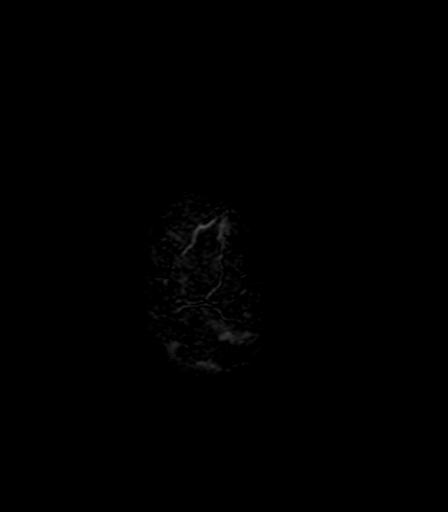

[Series 10: T1 · axial · 1.0mm · 1.00mm/px · z∈[-30,+140]mm · 16 of 176 slices shown (2 of 2)]
[im 1/176]
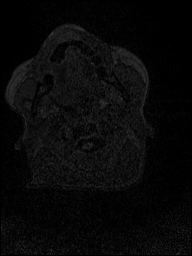
[im 12/176]
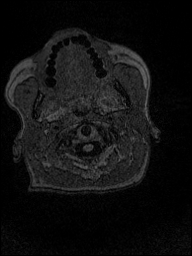
[im 24/176]
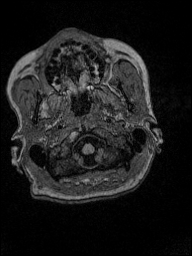
[im 36/176]
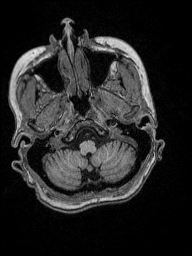
[im 47/176]
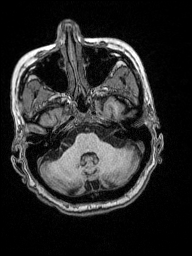
[im 59/176]
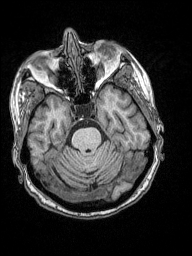
[im 71/176]
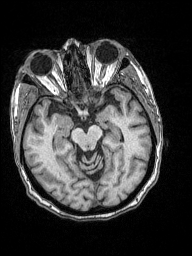
[im 82/176]
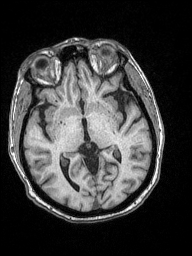
[im 94/176]
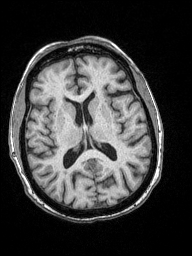
[im 106/176]
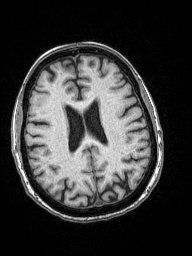
[im 117/176]
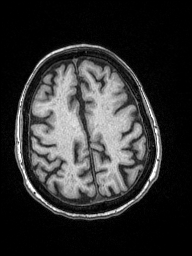
[im 129/176]
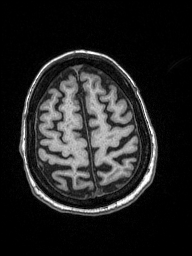
[im 141/176]
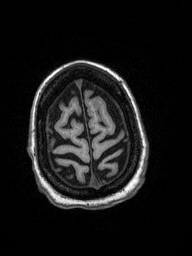
[im 152/176]
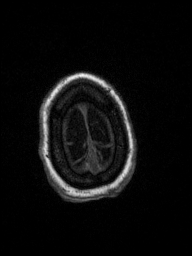
[im 164/176]
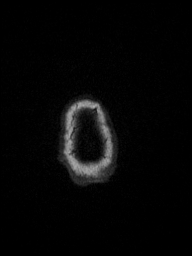
[im 176/176]
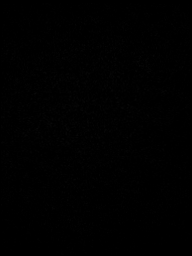

[Series 12: T2 · coronal · 5.0mm · 0.49mm/px · 2 of 27 slices shown (3 of 3)]
[im 1/27]
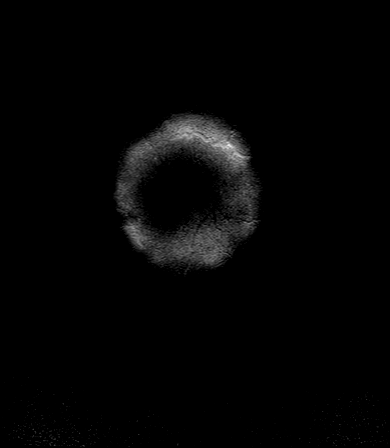
[im 27/27]
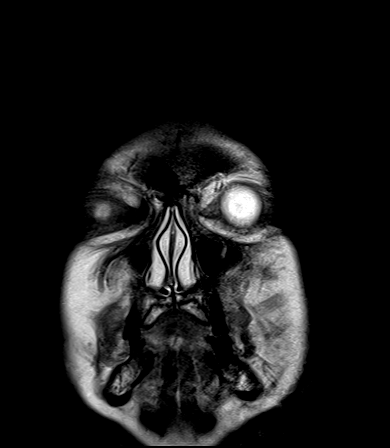

[Series 100: cor (id) · coronal · 3.0mm · 1.80mm/px · 4 of 45 slices shown]
[im 1/45]
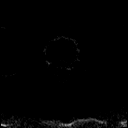
[im 15/45]
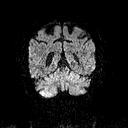
[im 30/45]
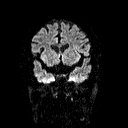
[im 45/45]
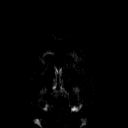

[Series 101: ax (id) · axial · 3.0mm · 1.80mm/px · z∈[-43,+117]mm · 5 of 54 slices shown]
[im 1/54]
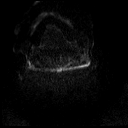
[im 14/54]
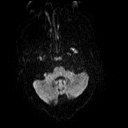
[im 27/54]
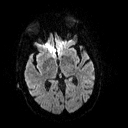
[im 40/54]
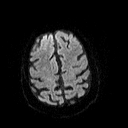
[im 54/54]
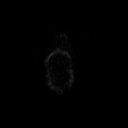

[48 of 48 positions shown; findings below may reference images not displayed]

FINDINGS: Brain: Diffusion imaging does not show any acute or subacute
infarction. The brainstem and cerebellum are normal. Cerebral
hemispheres show mild age related volume loss with minimal small
vessel change in the white matter, frontal lobe predominant. No
cortical or large vessel territory infarction. No mass lesion,
hemorrhage, hydrocephalus or extra-axial collection. No pituitary
mass.

Vascular: Major vessels at the base of the brain show flow.

Skull and upper cervical spine: Negative

Sinuses/Orbits: Clear/normal

Other: None significant
IMPRESSION: Minimal brain volume loss, typical of age. Minimal small vessel
change of the hemispheric white matter, less than often seen in
healthy individuals of this age. No acute or reversible finding.

## 2018-04-28 ENCOUNTER — Other Ambulatory Visit (INDEPENDENT_AMBULATORY_CARE_PROVIDER_SITE_OTHER): Payer: Medicare Other

## 2018-04-28 DIAGNOSIS — E871 Hypo-osmolality and hyponatremia: Secondary | ICD-10-CM | POA: Diagnosis not present

## 2018-04-28 LAB — BASIC METABOLIC PANEL
BUN: 19 mg/dL (ref 6–23)
CHLORIDE: 95 meq/L — AB (ref 96–112)
CO2: 26 mEq/L (ref 19–32)
Calcium: 9.4 mg/dL (ref 8.4–10.5)
Creatinine, Ser: 1 mg/dL (ref 0.40–1.20)
GFR: 56.69 mL/min — ABNORMAL LOW (ref >60.00)
GLUCOSE: 102 mg/dL — AB (ref 70–99)
POTASSIUM: 4.4 meq/L (ref 3.5–5.1)
SODIUM: 130 meq/L — AB (ref 135–145)

## 2018-04-29 ENCOUNTER — Telehealth: Payer: Self-pay | Admitting: *Deleted

## 2018-04-29 ENCOUNTER — Other Ambulatory Visit: Payer: Medicare Other

## 2018-04-29 DIAGNOSIS — R2681 Unsteadiness on feet: Secondary | ICD-10-CM | POA: Diagnosis not present

## 2018-04-29 DIAGNOSIS — R399 Unspecified symptoms and signs involving the genitourinary system: Secondary | ICD-10-CM | POA: Diagnosis not present

## 2018-04-29 DIAGNOSIS — M6281 Muscle weakness (generalized): Secondary | ICD-10-CM | POA: Diagnosis not present

## 2018-04-29 DIAGNOSIS — Z741 Need for assistance with personal care: Secondary | ICD-10-CM | POA: Diagnosis not present

## 2018-04-29 DIAGNOSIS — R278 Other lack of coordination: Secondary | ICD-10-CM | POA: Diagnosis not present

## 2018-04-29 NOTE — Telephone Encounter (Signed)
This has been submitted to insurance. Await results of approval.

## 2018-04-29 NOTE — Telephone Encounter (Signed)
Spoke with patient and reviewed that we need her prescription drug card information for qualification for [place] or [place]. She provided the following information over the phone.  [employer] Medicare Rx Member ID [id number] Rx Kara Dies [id number] Rx PCN [facility] [id number]  Group ID # [id number]  [id number] that I would send this information over to pharmacy team and would be in touch if they need any further documentation.

## 2018-04-29 NOTE — Telephone Encounter (Signed)
-----   Message from Erskine Emery, [employer] sent at 04/14/2018  1:14 PM EDT ----- HI Pam, She should qualify for Repatha or Praluent. I do not see a Part D Medicare plan scanned in the system. Does she have prescription insurance? Have you tried to get a prior auth approved? If she does not have insurance, she will need to just fill out the patient assistance paperwork.  Hope this helps. Let me know if you need anything else or need me to submit through insurance.   Thanks, Georgina Peer   ----- Message ----- From: Valora Corporal, RN Sent: 04/11/2018   5:55 PM To: Erskine Emery, [facility]  Dr. Rockey Situ wanted to see if this patient would be eligible for any assistance and work up for repatha or praluent.   Thanks

## 2018-04-30 ENCOUNTER — Telehealth: Payer: Self-pay

## 2018-04-30 MED ORDER — EVOLOCUMAB 140 MG/ML ~~LOC~~ SOAJ: 1.0000 "pen " | SUBCUTANEOUS | 11 refills | Status: DC

## 2018-04-30 NOTE — Telephone Encounter (Signed)
Copied from [place] [phone]. Topic: General - Other >> Apr 30, 2018  4:47 PM Ivar Drape wrote: Reason for CRM:   Patient wants to know if it is okay to come tomorrow between 1:30pm - 2:00pm for a Sodium Check

## 2018-04-30 NOTE — Addendum Note (Signed)
Addended by: Gilverto Dileonardo E on: 04/30/2018 01:03 PM   Modules accepted: Orders

## 2018-04-30 NOTE — Telephone Encounter (Signed)
Voicemail from [facility] was left today stating that prior authorization has been approved for 1 year through 04/30/19. Rx sent to [facility] to assess copay.

## 2018-05-01 ENCOUNTER — Other Ambulatory Visit (INDEPENDENT_AMBULATORY_CARE_PROVIDER_SITE_OTHER): Payer: Medicare Other

## 2018-05-01 ENCOUNTER — Other Ambulatory Visit: Payer: Self-pay

## 2018-05-01 DIAGNOSIS — R4922 Hyponasality: Secondary | ICD-10-CM

## 2018-05-01 DIAGNOSIS — E871 Hypo-osmolality and hyponatremia: Secondary | ICD-10-CM

## 2018-05-01 LAB — SODIUM: Sodium: 133 mEq/L — ABNORMAL LOW (ref 135–145)

## 2018-05-01 NOTE — Telephone Encounter (Signed)
Patient calling stating she's been seeing commercials for Reptha and noticed some warnings on this medications  Would like make sure Dr Charlotte Wagner is aware of those warnings and is okay for her to take  Please call back

## 2018-05-01 NOTE — Telephone Encounter (Signed)
Pt scheduled  

## 2018-05-01 NOTE — Telephone Encounter (Signed)
Dr. Rockey Situ- thoughts?

## 2018-05-01 NOTE — Telephone Encounter (Signed)
Repatha copay is $116 per month. Please see if pt is comfortable with this copay. If not, she can fill out the Safety Net Application through [employer] to see if she qualifies for patient assistance.

## 2018-05-02 ENCOUNTER — Ambulatory Visit: Payer: Self-pay

## 2018-05-02 NOTE — Telephone Encounter (Signed)
Charted in result notes. 

## 2018-05-04 NOTE — Telephone Encounter (Signed)
Have not had any significnat reactions Benefits outweight the risks

## 2018-05-05 NOTE — Telephone Encounter (Signed)
Preferably 1500 cc a day No more than 2000 cc Closely monitor weight We'll try to avoid congestive heart failure

## 2018-05-05 NOTE — Telephone Encounter (Signed)
There is no interaction or contraindication between ACEi and Repatha.

## 2018-05-05 NOTE — Telephone Encounter (Signed)
S/w patient. She verbalized understanding of Dr Donivan Scull recommendations. She is also concerned about taking an ace-inhibitor (her Ramipril) with the Repatha as she saw on the commercial advertisement that it is contraindicated.   She also wanted to know how much clear liquids Dr Rockey Situ recommend she drink a day concerning her furosemide and decreased serum sodium.  Advised I will route to Dr Rockey Situ for advice on the amount of fluid intake and to the pharmacy concerning the [place].

## 2018-05-06 NOTE — Telephone Encounter (Signed)
S/w patient. She verbalized understanding of pharmacist response as well as Dr Donivan Scull recommendations. No further questions at this time.

## 2018-05-07 ENCOUNTER — Other Ambulatory Visit
Admission: RE | Admit: 2018-05-07 | Discharge: 2018-05-07 | Disposition: A | Discharge status: Home / Self Care | Payer: Medicare Other | Source: Ambulatory Visit | Attending: Obstetrics and Gynecology | Admitting: Obstetrics and Gynecology

## 2018-05-07 ENCOUNTER — Encounter (INDEPENDENT_AMBULATORY_CARE_PROVIDER_SITE_OTHER): Payer: Self-pay

## 2018-05-07 ENCOUNTER — Inpatient Hospital Stay: Payer: Medicare Other | Attending: Obstetrics and Gynecology | Admitting: Obstetrics and Gynecology

## 2018-05-07 VITALS — BP 113/78 | HR 79 | Temp 98.1°F | Resp 18 | Ht 63.0 in | Wt 174.0 lb

## 2018-05-07 DIAGNOSIS — L989 Disorder of the skin and subcutaneous tissue, unspecified: Secondary | ICD-10-CM

## 2018-05-07 MED ORDER — MICONAZOLE NITRATE 2 % EX POWD: CUTANEOUS | 0 refills | Status: DC | PRN

## 2018-05-07 MED ORDER — NITROFURANTOIN MONOHYD MACRO 100 MG PO CAPS: 100.0000 mg | ORAL_CAPSULE | Freq: Two times a day (BID) | ORAL | 0 refills | Status: AC

## 2018-05-07 NOTE — Progress Notes (Signed)
Pt has only had 1 partner her husband and she has never had anal sex. She did divorce her husband and he was a homosexual.  If pt had HPV she feels it would have came from her husband and she had sex very little with her husband because he always told her she was not pretty.
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
## 2018-05-09 ENCOUNTER — Telehealth: Payer: Self-pay | Admitting: Cardiovascular Disease

## 2018-05-09 ENCOUNTER — Telehealth: Payer: Self-pay | Admitting: *Deleted

## 2018-05-09 DIAGNOSIS — R490 Dysphonia: Secondary | ICD-10-CM | POA: Diagnosis not present

## 2018-05-09 NOTE — Telephone Encounter (Signed)
Please call regarding Repatha rx.

## 2018-05-09 NOTE — Telephone Encounter (Signed)
Spoke with patient and she was approved for the medication but just needed the education on injecting. Advised that I would have someone call her first thing Monday (after 10 am per her request) to arrange time for her to come in so we can review. She verbalized understanding with no further questions at this time. Will route to scheduling to set up time for education on pen use.

## 2018-05-09 NOTE — Telephone Encounter (Signed)
Requested records have been sent electronically

## 2018-05-09 NOTE — Telephone Encounter (Signed)
Patient calling to check on status  Please call to discuss  

## 2018-05-09 NOTE — Telephone Encounter (Signed)
Copied from [place] [id number]. Topic: General - Other >> May 09, 2018 11:53 AM Yvette Rack wrote: Reason for CRM: Verdis Frederickson from Dr Tula Nakayama office  [phone] calling for recent CPE of notes and lab etc pt had an appt yesterday and need the most recent physical on her with everything that was done ([phone]

## 2018-05-12 NOTE — Telephone Encounter (Signed)
Scheduled 8/13 at 2 PM

## 2018-05-13 DIAGNOSIS — R490 Dysphonia: Secondary | ICD-10-CM | POA: Diagnosis not present

## 2018-05-13 LAB — HPV, LOW VOLUME (REFLEX): HPV, LOW VOL REFLEX: NEGATIVE

## 2018-05-13 LAB — PAP LB AND HPV HIGH-RISK: PAP SMEAR COMMENT: 0

## 2018-05-16 ENCOUNTER — Telehealth: Payer: Self-pay

## 2018-05-16 NOTE — Telephone Encounter (Signed)
-----   Message from Clent Jacks, RN sent at 05/16/2018 10:01 AM EDT ----- Regarding: pap results Can you call her pap results to her? Her pap is normal. They were unable to test HPV due to not having enough cells in the specimen.

## 2018-05-16 NOTE — Telephone Encounter (Signed)
Spoke with the patient to inform her that there was not enough cell to test for HPV, but her PAP was normal. The patient was understanding.

## 2018-05-20 ENCOUNTER — Ambulatory Visit (INDEPENDENT_AMBULATORY_CARE_PROVIDER_SITE_OTHER): Payer: Medicare Other | Admitting: *Deleted

## 2018-05-20 DIAGNOSIS — E7800 Pure hypercholesterolemia, unspecified: Secondary | ICD-10-CM

## 2018-05-20 NOTE — Progress Notes (Signed)
Patient in today for education on use of repatha pen. Reviewed pen information along with injection sites and how to use the pen. She also requested pen PDR printout which I provided for her. She was very appreciative with no further questions at this time.

## 2018-05-22 ENCOUNTER — Telehealth: Payer: Self-pay | Admitting: *Deleted

## 2018-05-22 DIAGNOSIS — D649 Anemia, unspecified: Secondary | ICD-10-CM

## 2018-05-22 DIAGNOSIS — E7800 Pure hypercholesterolemia, unspecified: Secondary | ICD-10-CM

## 2018-05-22 DIAGNOSIS — I25118 Atherosclerotic heart disease of native coronary artery with other forms of angina pectoris: Secondary | ICD-10-CM

## 2018-05-22 DIAGNOSIS — E871 Hypo-osmolality and hyponatremia: Secondary | ICD-10-CM

## 2018-05-22 NOTE — Telephone Encounter (Signed)
-----   Message from Leeroy Bock, [facility] sent at 05/22/2018  7:45 AM EDT ----- Regarding: RE: Repatha The specialty [facility] pharmacy that has her prescription usually has a quick turnaround. Has she tried calling them to coordinate her shipment?   Thanks, Jinny Blossom  ----- Message ----- From: Valora Corporal, RN Sent: 05/22/2018   7:41 AM EDT To: Leeroy Bock, RPH Subject: RE: Repatha                                    So this patient has not started her Repatha. So I will check with her to see when she starts to please let us know so we can order repeat labs.  Thanks ----- Message ----- From: Leeroy Bock, [facility] Sent: 05/21/2018   1:34 PM EDT To: Valora Corporal, RN Subject: Eilene Ghazi,  I'm going through my stack of [id number] papers and saw that Ms Marchese has received her Repatha and started injections. We usually recheck lipid panels after they have had 3-4 injections to assess efficacy. I was hoping you could get this scheduled with pt in the [place] office.  Thanks, [employer]

## 2018-05-22 NOTE — Telephone Encounter (Signed)
Spoke with patient and reviewed that we would like to have repeat labs done after her 4th injection of Repatha. She verbalized understanding and prefers to have her labs done at Dr. Bary Leriche office because it is easier for her to get transportation there. Advised that I would enter orders to be done there and will reach out to them to see if appointment is needed. She was appreciative for the call and had no further questions at this time. She did state that her medication was being sent to her and she should have it today or tomorrow.

## 2018-05-22 NOTE — Telephone Encounter (Signed)
Left message for patient to call back and schedule.

## 2018-05-22 NOTE — Telephone Encounter (Signed)
Patient needs fasting labs in Oct per Dr. Rockey Situ. Her appt on 10/8 is not until 2 pm

## 2018-05-22 NOTE — Telephone Encounter (Signed)
Please place future orders for lab appt next week.

## 2018-05-22 NOTE — Telephone Encounter (Signed)
Just noted that patient has appointment with Dr. Nicki Reaper on 07/15/18 and that if she had labs done at that time it would help reduce any multiple visits.

## 2018-05-23 NOTE — Telephone Encounter (Signed)
Orders placed for f/u cbc, ferritin and met b.

## 2018-05-27 ENCOUNTER — Telehealth: Payer: Self-pay | Admitting: Internal Medicine

## 2018-05-27 ENCOUNTER — Other Ambulatory Visit (INDEPENDENT_AMBULATORY_CARE_PROVIDER_SITE_OTHER): Payer: Medicare Other

## 2018-05-27 DIAGNOSIS — E871 Hypo-osmolality and hyponatremia: Secondary | ICD-10-CM

## 2018-05-27 DIAGNOSIS — D649 Anemia, unspecified: Secondary | ICD-10-CM

## 2018-05-27 DIAGNOSIS — E7800 Pure hypercholesterolemia, unspecified: Secondary | ICD-10-CM

## 2018-05-27 DIAGNOSIS — I25118 Atherosclerotic heart disease of native coronary artery with other forms of angina pectoris: Secondary | ICD-10-CM

## 2018-05-27 LAB — CBC WITH DIFFERENTIAL/PLATELET
BASOS ABS: 0 10*3/uL (ref 0.0–0.1)
BASOS PCT: 0.5 % (ref 0.0–3.0)
Eosinophils Absolute: 0.1 10*3/uL (ref 0.0–0.7)
Eosinophils Relative: 1.5 % (ref 0.0–5.0)
HEMATOCRIT: 37.6 % (ref 36.0–46.0)
Hemoglobin: 12.7 g/dL (ref 12.0–15.0)
LYMPHS ABS: 0.7 10*3/uL (ref 0.7–4.0)
LYMPHS PCT: 19.2 % (ref 12.0–46.0)
MCHC: 33.8 g/dL (ref 30.0–36.0)
MCV: 98.1 fl (ref 78.0–100.0)
MONOS PCT: 14.8 % — AB (ref 3.0–12.0)
Monocytes Absolute: 0.5 10*3/uL (ref 0.1–1.0)
NEUTROS ABS: 2.4 10*3/uL (ref 1.4–7.7)
NEUTROS PCT: 64 % (ref 43.0–77.0)
PLATELETS: 215 10*3/uL (ref 150.0–400.0)
RBC: 3.83 Mil/uL — ABNORMAL LOW (ref 3.87–5.11)
RDW: 14.9 % (ref 11.5–15.5)
WBC: 3.7 10*3/uL — ABNORMAL LOW (ref 4.0–10.5)

## 2018-05-27 LAB — BASIC METABOLIC PANEL
BUN: 21 mg/dL (ref 6–23)
CALCIUM: 9.8 mg/dL (ref 8.4–10.5)
CO2: 27 meq/L (ref 19–32)
Chloride: 99 mEq/L (ref 96–112)
Creatinine, Ser: 1.06 mg/dL (ref 0.40–1.20)
GFR: 53 mL/min — AB (ref >60.00)
GLUCOSE: 117 mg/dL — AB (ref 70–99)
Potassium: 4.2 mEq/L (ref 3.5–5.1)
Sodium: 133 mEq/L — ABNORMAL LOW (ref 135–145)

## 2018-05-27 LAB — FERRITIN: Ferritin: 61.7 ng/mL (ref 10.0–291.0)

## 2018-05-27 NOTE — Telephone Encounter (Signed)
Pt dropped a renewal for her handicapp sign. Paper is up front in color folder.

## 2018-05-27 NOTE — Addendum Note (Signed)
Addended by: Arby Barrette on: 05/27/2018 11:07 AM   Modules accepted: Orders

## 2018-05-28 ENCOUNTER — Other Ambulatory Visit: Payer: Self-pay | Admitting: Internal Medicine

## 2018-05-28 DIAGNOSIS — E7800 Pure hypercholesterolemia, unspecified: Secondary | ICD-10-CM

## 2018-05-28 DIAGNOSIS — E871 Hypo-osmolality and hyponatremia: Secondary | ICD-10-CM

## 2018-05-28 DIAGNOSIS — D649 Anemia, unspecified: Secondary | ICD-10-CM

## 2018-05-28 NOTE — Telephone Encounter (Signed)
Dr. Nicki Reaper has signed form and placed in box

## 2018-05-28 NOTE — Telephone Encounter (Signed)
Can you reorder labs for patient? I have scheduled her here to have labs done in October but the labs got cancelled yesterday when she came in for labs here. Thank you!

## 2018-05-28 NOTE — Progress Notes (Signed)
Orders placed for labs

## 2018-05-29 DIAGNOSIS — R399 Unspecified symptoms and signs involving the genitourinary system: Secondary | ICD-10-CM | POA: Diagnosis not present

## 2018-05-29 NOTE — Telephone Encounter (Signed)
Orders placed again for future labs that patient will have done at her PCP office.

## 2018-05-29 NOTE — Telephone Encounter (Signed)
Handicap form placed in mail

## 2018-05-29 NOTE — Addendum Note (Signed)
Addended by: Valora Corporal on: 05/29/2018 05:10 PM   Modules accepted: Orders

## 2018-05-30 ENCOUNTER — Other Ambulatory Visit: Payer: Self-pay | Admitting: Internal Medicine

## 2018-05-30 DIAGNOSIS — D649 Anemia, unspecified: Secondary | ICD-10-CM

## 2018-05-30 NOTE — Progress Notes (Signed)
Order placed for f/u lab.   

## 2018-06-03 ENCOUNTER — Other Ambulatory Visit: Payer: Self-pay | Admitting: Internal Medicine

## 2018-06-03 DIAGNOSIS — N390 Urinary tract infection, site not specified: Secondary | ICD-10-CM

## 2018-06-03 NOTE — Progress Notes (Signed)
Order placed for urology referral.  

## 2018-06-24 ENCOUNTER — Encounter: Payer: Self-pay | Admitting: Urology

## 2018-06-24 ENCOUNTER — Ambulatory Visit (INDEPENDENT_AMBULATORY_CARE_PROVIDER_SITE_OTHER): Payer: Medicare Other | Admitting: Urology

## 2018-06-24 VITALS — BP 127/75 | HR 105 | Ht 63.0 in | Wt 174.5 lb

## 2018-06-24 DIAGNOSIS — I25118 Atherosclerotic heart disease of native coronary artery with other forms of angina pectoris: Secondary | ICD-10-CM

## 2018-06-24 DIAGNOSIS — N952 Postmenopausal atrophic vaginitis: Secondary | ICD-10-CM | POA: Diagnosis not present

## 2018-06-24 DIAGNOSIS — N390 Urinary tract infection, site not specified: Secondary | ICD-10-CM

## 2018-06-24 LAB — BLADDER SCAN AMB NON-IMAGING: SCAN RESULT: 61

## 2018-06-24 LAB — URINALYSIS, COMPLETE
BILIRUBIN UA: NEGATIVE
GLUCOSE, UA: NEGATIVE
KETONES UA: NEGATIVE
Leukocytes, UA: NEGATIVE
NITRITE UA: NEGATIVE
PROTEIN UA: NEGATIVE
RBC, UA: NEGATIVE
SPEC GRAV UA: 1.015 (ref 1.005–1.030)
Urobilinogen, Ur: 0.2 mg/dL (ref 0.2–1.0)
pH, UA: 5.5 (ref 5.0–7.5)

## 2018-06-24 LAB — MICROSCOPIC EXAMINATION
Bacteria, UA: NONE SEEN
Epithelial Cells (non renal): NONE SEEN /hpf (ref 0–10)
RBC, UA: NONE SEEN /hpf (ref 0–2)
WBC UA: NONE SEEN /HPF (ref 0–5)

## 2018-06-24 MED ORDER — ESTROGENS, CONJUGATED 0.625 MG/GM VA CREA: TOPICAL_CREAM | VAGINAL | 12 refills | Status: DC

## 2018-06-24 MED ORDER — NITROFURANTOIN MONOHYD MACRO 100 MG PO CAPS: 100.0000 mg | ORAL_CAPSULE | Freq: Two times a day (BID) | ORAL | 0 refills | Status: DC

## 2018-06-24 NOTE — Progress Notes (Signed)
06/24/2018 12:08 PM   [name], MD [birth year] [id number]  Referring provider: [name], [address] [place], [place] [address]  Chief Complaint  Patient presents with  . Recurrent UTI    HPI: Patient is a 82 -year-old Caucasian female who is referred to [place] by Dr. [name] for recurrent urinary tract infections.  She states that she has been suffering with frequent urinary tract infections for years.  She states they started when she became sexually active after marriage, but the infections continued after she was no longer married.  She states that she has not had any sexual intercourse for the last 30 years.    She had been on suppressive Macrobid for a number of years, but it was discontinued in 2014 after she moved here from [place].  She stated that she had done well over the last 4 years with about 1 UTI yearly.  The UTIs started occurring frequently again in June of this year.  She states with 1 of these infections she developed sepsis and was admitted to the hospital.  This was on March 21, 2018.  Reviewing her records,  she has had two documented positive urine cultures.   + E. Coli resistant to ampicillin, ciprofloxacin and trimethoprim/sulfamethoxazole and on March 18, 2018 -hospitalized for sepsis + Proteus Mirabella's resistant to nitrofurantoin and tetracycline  Her symptoms with a urinary tract infection consist of increased urgency, explosive incontinence and bladder spasms.    Her baseline symptoms consists of frequency (due to Lasix), nocturia, urge incontinence (wears incontinence pads), straining to urinate and a weak urinary stream.  Patient denies any gross hematuria, dysuria or suprapubic/flank pain.  Patient denies any fevers, chills, nausea or vomiting.   She does not have a history of nephrolithiasis, GU surgery or GU trauma.   She is not sexually active.  She is postmenopausal.   She admits to constipation  and/or diarrhea due to IBS.    She does engage in good perineal hygiene. She does not take tub baths.    Contrast CT in 10/2017 revealed no renal masses, hydronephrosis or ureteral calculi.  Her bladder was unremarkable.  She is drinking a good amount of water daily.     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 Jerlyn Ly in [place] researching recurrent urinary tract infections.    Her UA today was negative.  Her PVR is 61 mL.  She is wanting to restart her Macrobid suppression at this time.  PMH: Past Medical History:  Diagnosis Date  . Allergy   . Arthritis    s/p bilateral knees and left hip replacement  . Asthma   . CAD (coronary artery disease)    a. 12/1996 s/p CABG x4 ([facility]);  b. 2005 Pt reports stress test & cath, which revealed patent grafts.  . Cancer ([facility])    melanoma right arm  . Carotid arterial disease ([facility])    a. 04/2015 Carotid U/S: <50% bilat ICA stenosis.  . Chronic kidney disease   . Colon polyps    H/O  . Depression   . GERD (gastroesophageal reflux disease)    h/o hiatal hernia  . Headache    migraines in past  . Heart murmur    a. 04/2011 Echo: EF 55-60%, bilat atrial enlargement, mild to mod TR.  [name] History of chicken pox   . History of hiatal hernia   . Hx of migraines  rare now  . Hx: UTI (urinary tract infection)   . Hyperlipidemia    a. Statin intolerant -->on zetia.  . Hypertension   . Hypertensive heart disease   . Lichen planus   . Melanoma ([facility])   . Palpitations    a. rare PVC's and h/o SVT.  [name] PMR (polymyalgia rheumatica) ([facility])    h/o in setting of crestor usage.  [name] PSVT (paroxysmal supraventricular tachycardia) ([facility])   . PUD (peptic ulcer disease)    remote history  . Raynaud's phenomenon   . Spinal stenosis   . Urine incontinence    H/O  . Vitamin D deficiency     Surgical History: Past Surgical History:  Procedure Laterality Date  . ADENOIDECTOMY     age 12  . BACK  SURGERY     L3-L5  . BILATERAL CARPAL TUNNEL RELEASE    . BREAST BIOPSY Right    bx x 3 neg  . BREAST SURGERY Right    biopsy x 3 (all benign)  . CARDIAC CATHETERIZATION N/A 06/01/2016   Procedure: LEFT HEART CATH AND CORS/GRAFTS ANGIOGRAPHY;  Surgeon: Wellington Hampshire, MD;  Location: [facility];  Service: Cardiovascular;  Laterality: N/A;  . CARDIAC CATHETERIZATION N/A 06/01/2016   Procedure: Coronary Stent Intervention;  Surgeon: Wellington Hampshire, MD;  Location: [facility];  Service: Cardiovascular;  Laterality: N/A;  . CARDIOVERSION N/A 03/08/2017   Procedure: CARDIOVERSION;  Surgeon: Wellington Hampshire, MD;  Location: [facility];  Service: Cardiovascular;  Laterality: N/A;  . CATARACT EXTRACTION W/PHACO Right 01/04/2016   Procedure: CATARACT EXTRACTION PHACO AND INTRAOCULAR LENS PLACEMENT ([facility]);  Surgeon: Leandrew Koyanagi, MD;  Location: [facility];  Service: Ophthalmology;  Laterality: Right;  MALYUGIN  . CATARACT EXTRACTION W/PHACO Left 01/25/2016   Procedure: CATARACT EXTRACTION PHACO AND INTRAOCULAR LENS PLACEMENT ([facility]) left eye;  Surgeon: Leandrew Koyanagi, MD;  Location: [facility];  Service: Ophthalmology;  Laterality: Left;  MALYUGIN SHUGARCAINE  . CHOLECYSTECTOMY  90's  . COLONOSCOPY WITH PROPOFOL N/A 05/03/2015   Procedure: COLONOSCOPY WITH PROPOFOL;  Surgeon: Lollie Sails, MD;  Location: [facility];  Service: Endoscopy;  Laterality: N/A;  . CORONARY ARTERY BYPASS GRAFT  98   4 vessel  . CORONARY STENT INTERVENTION N/A 03/31/2018   Procedure: CORONARY STENT INTERVENTION;  Surgeon: Wellington Hampshire, MD;  Location: [facility];  Service: Cardiovascular;  Laterality: N/A;  . ESOPHAGOGASTRODUODENOSCOPY N/A 03/22/2015   Procedure: ESOPHAGOGASTRODUODENOSCOPY (EGD);  Surgeon: Lollie Sails, MD;  Location: [facility];  Service: Endoscopy;  Laterality: N/A;  . HEMORRHOID BANDING    . HEMORRHOID SURGERY N/A 02/17/2018    Procedure: HEMORRHOIDECTOMY;  Surgeon: Robert Bellow, MD;  Location: [facility];  Service: General;  Laterality: N/A;  . JOINT REPLACEMENT     BILATERAL KNEE REPLACEMENTS  . KNEE ARTHROSCOPY W/ OATS PROCEDURE     Lt knee (9/01), Rt knee (3/11), Lt hip (5/10)  . LEFT HEART CATH AND CORONARY ANGIOGRAPHY N/A 03/31/2018   Procedure: LEFT HEART CATH AND CORONARY ANGIOGRAPHY;  Surgeon: Wellington Hampshire, MD;  Location: [facility];  Service: Cardiovascular;  Laterality: N/A;  . TOTAL HIP ARTHROPLASTY      Home Medications:  Allergies as of 06/24/2018      Reactions   Beta Adrenergic Blockers Shortness Of Breath   DEPRESSION/HYPOTENSION   Brilinta [ticagrelor] Shortness Of Breath   Caused AFIB   Albuterol    rigors   Antihistamines, Diphenhydramine-type  Muscle spasms   Aspirin Other (See Comments)   Heartburn    Nsaids Other (See Comments)   ULCER HISTORY & CURRENTLY ON BLOOD THINNERS   Penicillins Hives, Other (See Comments)   Has patient had a PCN reaction causing immediate rash, facial/tongue/throat swelling, SOB or lightheadedness with hypotension: No Has patient had a PCN reaction causing severe rash involving mucus membranes or skin necrosis: No Has patient had a PCN reaction that required hospitalization No Has patient had a PCN reaction occurring within the last 10 years: No If all of the above answers are "NO", then may proceed with Cephalosporin use.   Statins Other (See Comments)   Muscle weakness   Sulfa Antibiotics Rash   At mouth   Sulfasalazine Rash   At mouth   Tetracycline Rash   Tetracyclines & Related Rash      Medication List        Accurate as of 06/24/18 12:08 PM. Always use your most recent med list.          acetaminophen 500 MG tablet Commonly known as:  TYLENOL Take 1,000 mg by mouth 2 (two) times daily.   ADVAIR DISKUS 250-50 MCG/DOSE Aepb Generic drug:  Fluticasone-Salmeterol INHALE 1 PUFF BY MOUTH TWICE DAILY   amitriptyline  25 MG tablet Commonly known as:  ELAVIL Take 25 mg by mouth at bedtime   apixaban 5 MG Tabs tablet Commonly known as:  ELIQUIS Take 1 tablet (5 mg total) by mouth 2 (two) times daily.   ATROVENT HFA 17 MCG/ACT inhaler Generic drug:  ipratropium inhale 2 puffs INTO THE LUNGS every 6 hours if needed for wheezing   b complex vitamins tablet Take 1 tablet by mouth daily.   clopidogrel 75 MG tablet Commonly known as:  PLAVIX Take 1 tablet (75 mg total) by mouth daily with breakfast.   conjugated estrogens vaginal cream Commonly known as:  PREMARIN Apply 0.2m (pea-sized amount)  just inside the vaginal introitus with a finger-tip on  Monday, Wednesday and Friday nights.   CoQ-10 200 MG Caps Take 200 mg by mouth daily.   Evolocumab 140 MG/ML Soaj Inject 1 pen into the skin every 14 (fourteen) days.   ezetimibe 10 MG tablet Commonly known as:  ZETIA Take 1 tablet (10 mg total) by mouth daily.   ferrous sulfate 325 (65 FE) MG tablet Take 1 tablet (325 mg total) by mouth 2 (two) times daily with a meal.   folic acid 1 MG tablet Commonly known as:  FOLVITE Take 1 tablet (1 mg total) by mouth daily.   furosemide 40 MG tablet Commonly known as:  LASIX Take 1 tablet (40 mg total) by mouth daily.   HAIR/SKIN/NAILS PO Take 1 tablet by mouth daily.   hydrocortisone 2.5 % cream Apply to VULVA at bedtime as needed with miconazole for lichen planus   ketoconazole 2 % cream Commonly known as:  NIZORAL Apply 1 application topically daily as needed (for crack bleeding fingers.).   loratadine 10 MG tablet Commonly known as:  CLARITIN Take 10 mg by mouth daily.   meclizine 25 MG tablet Commonly known as:  ANTIVERT Take 25 mg by mouth 3 (three) times daily as needed for dizziness.   miconazole 2 % cream Commonly known as:  MICOTIN Apply 1 application topically at bedtime as needed (with hydrocortisone for lichen planus).   miconazole 2 % powder Commonly known as:   MICOTIN Apply topically as needed for itching.   nitrofurantoin (macrocrystal-monohydrate) 100 MG capsule  Commonly known as:  MACROBID Take 1 capsule (100 mg total) by mouth every 12 (twelve) hours.   nitroGLYCERIN 0.4 MG SL tablet Commonly known as:  NITROSTAT Place 1 tablet (0.4 mg total) under the tongue every 5 (five) minutes as needed for chest pain.   pantoprazole 40 MG tablet Commonly known as:  PROTONIX Take 1 tablet (40 mg total) by mouth 2 (two) times daily.   polyethylene glycol powder powder Commonly known as:  GLYCOLAX/MIRALAX Take 8.5 g by mouth daily as needed.   ramipril 5 MG capsule Commonly known as:  ALTACE Take 1 capsule (5 mg total) by mouth daily.   spironolactone 25 MG tablet Commonly known as:  ALDACTONE Take 1 tablet (25 mg total) by mouth every morning.   traZODone 150 MG tablet Commonly known as:  DESYREL TAKE 1 AND 2/3 TO 2 TABLETS BY MOUTH AT BEDTIME AS DIRECTED   verapamil 180 MG CR tablet Commonly known as:  CALAN-SR Take 1 tablet (180 mg total) by mouth 2 (two) times daily.   vitamin B-12 1000 MCG tablet Commonly known as:  CYANOCOBALAMIN Take 1,000 mcg by mouth daily.   vitamin C 500 MG tablet Commonly known as:  ASCORBIC ACID Take 500 mg by mouth at bedtime.   Vitamin D3 2000 units Tabs Take 2,000 Units by mouth 2 (two) times a week.       Allergies:  Allergies  Allergen Reactions  . Beta Adrenergic Blockers Shortness Of Breath    DEPRESSION/HYPOTENSION  . Brilinta [Ticagrelor] Shortness Of Breath    Caused AFIB  . Albuterol     rigors  . Antihistamines, Diphenhydramine-Type     Muscle spasms  . Aspirin Other (See Comments)    Heartburn   . Nsaids Other (See Comments)    ULCER HISTORY & CURRENTLY ON BLOOD THINNERS  . Penicillins Hives and Other (See Comments)    Has patient had a PCN reaction causing immediate rash, facial/tongue/throat swelling, SOB or lightheadedness with hypotension: No Has patient had a PCN  reaction causing severe rash involving mucus membranes or skin necrosis: No Has patient had a PCN reaction that required hospitalization No Has patient had a PCN reaction occurring within the last 10 years: No If all of the above answers are "NO", then may proceed with Cephalosporin use.   . Statins Other (See Comments)    Muscle weakness  . Sulfa Antibiotics Rash    At mouth  . Sulfasalazine Rash    At mouth  . Tetracycline Rash  . Tetracyclines & Related Rash    Family History: Family History  Problem Relation Age of Onset  . Thyroid disease Mother        graves disease  . Heart disease Father        rheumatic heart  . Alcohol abuse Father   . Depression Father   . Lung cancer Sister   . Depression Daughter   . Thyroid disease Daughter        hashimoto  . Depression Son   . Stroke Maternal Grandmother   . Depression Maternal Grandmother   . Hypertension Maternal Grandfather   . Hypertension Paternal Grandfather   . Seizures Son   . Breast cancer Neg Hx     Social History:  reports that she has quit smoking. She has never used smokeless tobacco. She reports that she does not drink alcohol or use drugs.  ROS: UROLOGY Frequent Urination?: Yes Hard to postpone urination?: No Burning/pain with urination?: No Get up  at night to urinate?: Yes Leakage of urine?: Yes Urine stream starts and stops?: No Trouble starting stream?: No Do you have to strain to urinate?: Yes Blood in urine?: No Urinary tract infection?: Yes Sexually transmitted disease?: No Injury to kidneys or bladder?: Yes Painful intercourse?: No Weak stream?: Yes Currently pregnant?: No Vaginal bleeding?: No Last menstrual period?: na(Early 30's)  Gastrointestinal Nausea?: No Vomiting?: No Indigestion/heartburn?: Yes Diarrhea?: No Constipation?: Yes  Constitutional Fever: No Night sweats?: Yes Weight loss?: Yes Fatigue?: Yes  Skin Skin rash/lesions?: No Itching?: No  Eyes Blurred  vision?: No Double vision?: No  Ears/Nose/Throat Sore throat?: No Sinus problems?: No  Hematologic/Lymphatic Swollen glands?: No Easy bruising?: Yes  Cardiovascular Leg swelling?: Yes Chest pain?: No  Respiratory Cough?: Yes Shortness of breath?: Yes  Endocrine Excessive thirst?: No  Musculoskeletal Back pain?: Yes Joint pain?: Yes  Neurological Headaches?: No Dizziness?: No  Psychologic Depression?: Yes Anxiety?: No  Physical Exam: BP 127/75 (BP Location: Left Arm, Patient Position: Sitting, Cuff Size: Normal)   Pulse (!) 105   Ht _0  (1.6 m)   Wt 174 lb 8 oz (79.2 kg)   BMI 30.91 kg/m   Constitutional:  Well nourished. Alert and oriented, No acute distress. HEENT: [place] AT, moist mucus membranes.  Trachea midline, no masses. Cardiovascular: No clubbing, cyanosis, or edema. Respiratory: Normal respiratory effort, no increased work of breathing. GI: Abdomen is soft, non tender, non distended, no abdominal masses. Liver and spleen not palpable.  No hernias appreciated.  Stool sample for occult testing is not indicated.   GU: No CVA tenderness.  No bladder fullness or masses.  Atrophic external genitalia, normal pubic hair distribution, no lesions.  Normal urethral meatus, no lesions, no prolapse, no discharge.   No urethral masses, tenderness and/or tenderness. No bladder fullness, tenderness or masses.  Narrow introitus, pale vagina mucosa, poor estrogen effect, no discharge, no lesions, good pelvic support, no cystocele or rectocele noted.  No cervical motion tenderness.  Uterus is freely mobile and non-fixed.  No adnexal/parametria masses or tenderness noted.  Anus and perineum are without rashes or lesions.    Skin: No rashes, bruises or suspicious lesions. Lymph: No cervical or inguinal adenopathy. Neurologic: Grossly intact, no focal deficits, moving all 4 extremities. Psychiatric: Normal mood and affect.  Laboratory Data: Lab Results  Component Value Date    WBC 3.7 (L) 05/27/2018   HGB 12.7 05/27/2018   HCT 37.6 05/27/2018   MCV 98.1 05/27/2018   PLT 215.0 05/27/2018    Lab Results  Component Value Date   CREATININE 1.06 05/27/2018    No results found for: PSA  No results found for: TESTOSTERONE  Lab Results  Component Value Date   HGBA1C 4.8 03/28/2018    Lab Results  Component Value Date   TSH 2.962 03/30/2018       Component Value Date/Time   CHOL 189 01/14/2018 0940   HDL 62.30 01/14/2018 0940   CHOLHDL 3 01/14/2018 0940   VLDL 15.4 01/14/2018 0940   LDLCALC 112 (H) 01/14/2018 0940    Lab Results  Component Value Date   AST 27 03/21/2018   Lab Results  Component Value Date   ALT 17 03/21/2018   No components found for: ALKALINEPHOPHATASE No components found for: BILIRUBINTOTAL  No results found for: ESTRADIOL  Urinalysis Negative.  See Epic.    I have reviewed the labs.   Pertinent Imaging: Results for LATIKA, KRONICK, MD (MRN [id number]) as of 06/29/2018 17:58  Ref.  Range 06/24/2018 12:10  Scan Result Unknown 61     Assessment & Plan:   1. rUTI's Criteria for recurrent UTI has been met with 2 or more infections in 6 months or 3 or greater infections in one year  She is to continue her water intake until the urine is pale yellow or clear (10 to 12 cups daily)  Patient is instructed to take probiotics (yogurt, oral pills or vaginal suppositories), take cranberry pills or drink the juice Avoid soaking in tubs and wipe front to back after urinating  UA is negative, but will send for culture as she states she occasionally gets urinary tract infections after pelvic exams She is given 3 days of Macrobid 100 mg twice daily to take at this time as I did perform a pelvic exam on the patient We will add the vaginal estrogen cream and have her continue conservative measures and also obtain a renal ultrasound to rule out any nidus for infection before restarting prophylactic Macrobid We may also have her see  ID if conservative measures do not prove to stave off her UTIs  2. Vaginal atrophy I explained to the patient that when women go through menopause and her estrogen levels are severely diminished, the normal vaginal flora will change.  This is due to an increase of the vaginal canal's pH. Because of this, the vaginal canal may be colonized by bacteria from the rectum instead of the protective lactobacillus.  This, accompanied by the loss of the mucus barrier with vaginal atrophy, is a cause of recurrent urinary tract infections. In some studies, the use of vaginal estrogen cream has been demonstrated to reduce  recurrent urinary tract infections to one a year.  Patient was given a sample of vaginal estrogen cream (Premarin vaginal cream) and instructed to apply 0.65m (pea-sized amount)  just inside the vaginal introitus with a finger-tip on Monday, Wednesday and Friday nights.  I explained to the patient that vaginally administered estrogen, which causes only a slight increase in the blood estrogen levels, have fewer contraindications and adverse systemic effects that oral HT. I have also given prescriptions for Premarin cream,if she finds the medication cost prohibitive, she is instructed to call the office.  We can then call in a compounded vaginal estrogen cream for the patient that may be more affordable.   She will follow up in three months for an exam.                                                        Return for RUS report .  These notes generated with voice recognition software. I apologize for typographical errors.  SZara Council PA-C  [facility] [address] [place] [phone]

## 2018-06-27 ENCOUNTER — Telehealth: Payer: Self-pay | Admitting: *Deleted

## 2018-06-27 LAB — CULTURE, URINE COMPREHENSIVE

## 2018-06-27 NOTE — Telephone Encounter (Signed)
Left results on phone

## 2018-06-27 NOTE — Telephone Encounter (Signed)
-----   Message from Nori Riis, PA-C sent at 06/27/2018  6:16 AM EDT ----- Please let Dr. Delene Loll know that her urine culture was negative.

## 2018-07-01 ENCOUNTER — Ambulatory Visit
Admission: RE | Admit: 2018-07-01 | Discharge: 2018-07-01 | Disposition: A | Discharge status: Home / Self Care | Payer: Medicare Other | Source: Ambulatory Visit | Attending: Urology | Admitting: Urology

## 2018-07-01 DIAGNOSIS — N390 Urinary tract infection, site not specified: Secondary | ICD-10-CM | POA: Diagnosis not present

## 2018-07-03 ENCOUNTER — Telehealth: Payer: Self-pay | Admitting: Family Medicine

## 2018-07-03 NOTE — Telephone Encounter (Signed)
-----   Message from Nori Riis, PA-C sent at 07/03/2018  7:27 AM EDT ----- Please let Dr. Delene Loll know that her renal ultrasound was normal.  Since her urine culture was negative as well, I would not recommend restarting the suppressive antibiotic at this time.  We will have outpatient ID available after October 1st here in [place] and I will put her on the waiting list for an appointment to get their input as well.

## 2018-07-03 NOTE — Telephone Encounter (Signed)
Patient notified and voiced understanding.

## 2018-07-08 DIAGNOSIS — Z23 Encounter for immunization: Secondary | ICD-10-CM | POA: Diagnosis not present

## 2018-07-15 ENCOUNTER — Ambulatory Visit: Payer: Medicare Other | Admitting: Internal Medicine

## 2018-07-15 ENCOUNTER — Encounter: Payer: Self-pay | Admitting: Internal Medicine

## 2018-07-15 ENCOUNTER — Ambulatory Visit (INDEPENDENT_AMBULATORY_CARE_PROVIDER_SITE_OTHER): Payer: Medicare Other | Admitting: Internal Medicine

## 2018-07-15 VITALS — BP 126/74 | HR 76 | Temp 98.2°F | Resp 16 | Ht 63.0 in | Wt 173.4 lb

## 2018-07-15 DIAGNOSIS — J4520 Mild intermittent asthma, uncomplicated: Secondary | ICD-10-CM

## 2018-07-15 DIAGNOSIS — M25511 Pain in right shoulder: Secondary | ICD-10-CM

## 2018-07-15 DIAGNOSIS — E7800 Pure hypercholesterolemia, unspecified: Secondary | ICD-10-CM | POA: Diagnosis not present

## 2018-07-15 DIAGNOSIS — N390 Urinary tract infection, site not specified: Secondary | ICD-10-CM | POA: Diagnosis not present

## 2018-07-15 DIAGNOSIS — F329 Major depressive disorder, single episode, unspecified: Secondary | ICD-10-CM | POA: Diagnosis not present

## 2018-07-15 DIAGNOSIS — D649 Anemia, unspecified: Secondary | ICD-10-CM | POA: Diagnosis not present

## 2018-07-15 DIAGNOSIS — R739 Hyperglycemia, unspecified: Secondary | ICD-10-CM

## 2018-07-15 DIAGNOSIS — I4819 Other persistent atrial fibrillation: Secondary | ICD-10-CM

## 2018-07-15 DIAGNOSIS — I25118 Atherosclerotic heart disease of native coronary artery with other forms of angina pectoris: Secondary | ICD-10-CM | POA: Diagnosis not present

## 2018-07-15 DIAGNOSIS — M4800 Spinal stenosis, site unspecified: Secondary | ICD-10-CM

## 2018-07-15 DIAGNOSIS — E871 Hypo-osmolality and hyponatremia: Secondary | ICD-10-CM

## 2018-07-15 DIAGNOSIS — F32A Depression, unspecified: Secondary | ICD-10-CM

## 2018-07-15 DIAGNOSIS — I10 Essential (primary) hypertension: Secondary | ICD-10-CM | POA: Diagnosis not present

## 2018-07-15 DIAGNOSIS — K2100 Gastro-esophageal reflux disease with esophagitis, without bleeding: Secondary | ICD-10-CM

## 2018-07-15 DIAGNOSIS — I5033 Acute on chronic diastolic (congestive) heart failure: Secondary | ICD-10-CM | POA: Diagnosis not present

## 2018-07-15 DIAGNOSIS — M25512 Pain in left shoulder: Secondary | ICD-10-CM | POA: Diagnosis not present

## 2018-07-15 DIAGNOSIS — E559 Vitamin D deficiency, unspecified: Secondary | ICD-10-CM

## 2018-07-15 LAB — FERRITIN: Ferritin: 60.6 ng/mL (ref 10.0–291.0)

## 2018-07-15 LAB — LIPID PANEL
CHOL/HDL RATIO: 2
CHOLESTEROL: 128 mg/dL (ref 0–200)
HDL: 60.1 mg/dL (ref >39.00)
LDL CALC: 48 mg/dL (ref 0–99)
NonHDL: 68.37
Triglycerides: 104 mg/dL (ref 0.0–149.0)
VLDL: 20.8 mg/dL (ref 0.0–40.0)

## 2018-07-15 LAB — CBC WITH DIFFERENTIAL/PLATELET
BASOS ABS: 0 10*3/uL (ref 0.0–0.1)
BASOS PCT: 0.5 % (ref 0.0–3.0)
Eosinophils Absolute: 0.1 10*3/uL (ref 0.0–0.7)
Eosinophils Relative: 1.6 % (ref 0.0–5.0)
HEMATOCRIT: 39.2 % (ref 36.0–46.0)
HEMOGLOBIN: 13.2 g/dL (ref 12.0–15.0)
LYMPHS PCT: 18.1 % (ref 12.0–46.0)
Lymphs Abs: 0.7 10*3/uL (ref 0.7–4.0)
MCHC: 33.6 g/dL (ref 30.0–36.0)
MCV: 101.3 fl — AB (ref 78.0–100.0)
MONOS PCT: 12 % (ref 3.0–12.0)
Monocytes Absolute: 0.4 10*3/uL (ref 0.1–1.0)
Neutro Abs: 2.5 10*3/uL (ref 1.4–7.7)
Neutrophils Relative %: 67.8 % (ref 43.0–77.0)
Platelets: 225 10*3/uL (ref 150.0–400.0)
RBC: 3.87 Mil/uL (ref 3.87–5.11)
RDW: 13.9 % (ref 11.5–15.5)
WBC: 3.7 10*3/uL — AB (ref 4.0–10.5)

## 2018-07-15 LAB — HEPATIC FUNCTION PANEL
ALT: 19 U/L (ref 0–35)
AST: 21 U/L (ref 0–37)
Albumin: 4.3 g/dL (ref 3.5–5.2)
Alkaline Phosphatase: 55 U/L (ref 39–117)
BILIRUBIN DIRECT: 0.2 mg/dL (ref 0.0–0.3)
BILIRUBIN TOTAL: 0.8 mg/dL (ref 0.2–1.2)
Total Protein: 6.9 g/dL (ref 6.0–8.3)

## 2018-07-15 LAB — BASIC METABOLIC PANEL
BUN: 22 mg/dL (ref 6–23)
CO2: 27 meq/L (ref 19–32)
CREATININE: 1 mg/dL (ref 0.40–1.20)
Calcium: 9.9 mg/dL (ref 8.4–10.5)
Chloride: 100 mEq/L (ref 96–112)
GFR: 56.66 mL/min — ABNORMAL LOW (ref >60.00)
GLUCOSE: 103 mg/dL — AB (ref 70–99)
POTASSIUM: 4.4 meq/L (ref 3.5–5.1)
Sodium: 135 mEq/L (ref 135–145)

## 2018-07-15 LAB — GLUCOSE, POCT (MANUAL RESULT ENTRY): POC Glucose: 92 mg/dl (ref 70–99)

## 2018-07-15 LAB — SEDIMENTATION RATE: SED RATE: 17 mm/h (ref 0–30)

## 2018-07-15 NOTE — Progress Notes (Signed)
Patient ID: Charlotte Sessions, MD, female   DOB: April 15, 1936, 82 y.o.   MRN: [id number]   Subjective:    Patient ID: Charlotte Sessions, MD, female    DOB: [birth year], 82 y.o.   MRN: [id number]  HPI  Patient here for a scheduled follow up.  Has had issues with recurrent uti.  Saw urology.  Had normal renal ultrasound.  Taking cranberry pills and using premarin cream.  Doing well on this regimen.  No urinary symptoms.  No chest pain.  Breathing stable.  She is trying to stay active.  Walking.  Takes miralax to help her bowels.  Has had her flu shot.  Left deltoid swollen and tight.  Better now.  Some tenderness.  Better.  Desires no further evaluation.  Overall she feels things are stable.  Has f/u with Dr Rockey Situ next month.     Past Medical History:  Diagnosis Date  . Allergy   . Arthritis    s/p bilateral knees and left hip replacement  . Asthma   . CAD (coronary artery disease)    a. 12/1996 s/p CABG x4 ([place]);  b. 2005 Pt reports stress test & cath, which revealed patent grafts.  . Cancer ([place])    melanoma right arm  . Carotid arterial disease ([place])    a. 04/2015 Carotid U/S: <50% bilat ICA stenosis.  . Chronic kidney disease   . Colon polyps    H/O  . Depression   . GERD (gastroesophageal reflux disease)    h/o hiatal hernia  . Headache    migraines in past  . Heart murmur    a. 04/2011 Echo: EF 55-60%, bilat atrial enlargement, mild to mod TR.  [name] History of chicken pox   . History of hiatal hernia   . Hx of migraines    rare now  . Hx: UTI (urinary tract infection)   . Hyperlipidemia    a. Statin intolerant -->on zetia.  . Hypertension   . Hypertensive heart disease   . Lichen planus   . Melanoma ([place])   . Palpitations    a. rare PVC's and h/o SVT.  [name] PMR (polymyalgia rheumatica) (H[facility])    h/o in setting of crestor usage.  [name] PSVT (paroxysmal supraventricular tachycardia) ([place])   . PUD (peptic ulcer disease)    remote history  . Raynaud's phenomenon   . Spinal stenosis    . Urine incontinence    H/O  . Vitamin D deficiency    Past Surgical History:  Procedure Laterality Date  . ADENOIDECTOMY     age 52  . BACK SURGERY     L3-L5  . BILATERAL CARPAL TUNNEL RELEASE    . BREAST BIOPSY Right    bx x 3 neg  . BREAST SURGERY Right    biopsy x 3 (all benign)  . CARDIAC CATHETERIZATION N/A 06/01/2016   Procedure: LEFT HEART CATH AND CORS/GRAFTS ANGIOGRAPHY;  Surgeon: Wellington Hampshire, MD;  Location: [place] [facility];  Service: Cardiovascular;  Laterality: N/A;  . CARDIAC CATHETERIZATION N/A 06/01/2016   Procedure: Coronary Stent Intervention;  Surgeon: Wellington Hampshire, MD;  Location: [place] [facility];  Service: Cardiovascular;  Laterality: N/A;  . CARDIOVERSION N/A 03/08/2017   Procedure: CARDIOVERSION;  Surgeon: Wellington Hampshire, MD;  Location: [facility];  Service: Cardiovascular;  Laterality: N/A;  . CATARACT EXTRACTION W/PHACO Right 01/04/2016   Procedure: CATARACT EXTRACTION PHACO AND INTRAOCULAR LENS PLACEMENT ([place]);  Surgeon: Leandrew Koyanagi, MD;  Location: [facility];  Service: Ophthalmology;  Laterality: Right;  MALYUGIN  . CATARACT EXTRACTION W/PHACO Left 01/25/2016   Procedure: CATARACT EXTRACTION PHACO AND INTRAOCULAR LENS PLACEMENT ([place]) left eye;  Surgeon: Leandrew Koyanagi, MD;  Location: [place];  Service: Ophthalmology;  Laterality: Left;  MALYUGIN SHUGARCAINE  . CHOLECYSTECTOMY  90's  . COLONOSCOPY WITH PROPOFOL N/A 05/03/2015   Procedure: COLONOSCOPY WITH PROPOFOL;  Surgeon: Lollie Sails, MD;  Location: [facility];  Service: Endoscopy;  Laterality: N/A;  . CORONARY ARTERY BYPASS GRAFT  98   4 vessel  . CORONARY STENT INTERVENTION N/A 03/31/2018   Procedure: CORONARY STENT INTERVENTION;  Surgeon: Wellington Hampshire, MD;  Location: [place] [facility];  Service: Cardiovascular;  Laterality: N/A;  . ESOPHAGOGASTRODUODENOSCOPY N/A 03/22/2015   Procedure: ESOPHAGOGASTRODUODENOSCOPY (EGD);  Surgeon: Lollie Sails, MD;  Location: [facility] END[facility];  Service: Endoscopy;  Laterality: N/A;  . HEMORRHOID BANDING    . HEMORRHOID SURGERY N/A 02/17/2018   Procedure: HEMORRHOIDECTOMY;  Surgeon: Robert Bellow, MD;  Location: [facility] ORS;  Service: General;  Laterality: N/A;  . JOINT REPLACEMENT     BILATERAL KNEE REPLACEMENTS  . KNEE ARTHROSCOPY W/ [place]ATS PROCEDURE     Lt knee (9/01), Rt knee (3/11), Lt hip (5/10)  . LEFT HEART CATH AND CORONARY ANGIOGRAPHY N/A 03/31/2018   Procedure: LEFT HEART CATH AND CORONARY ANGIOGRAPHY;  Surgeon: Wellington Hampshire, MD;  Location: [place] [facility];  Service: Cardiovascular;  Laterality: N/A;  . TOTAL HIP ARTHROPLASTY     Family History  Problem Relation Age of Onset  . Thyroid disease Mother        graves disease  . Heart disease Father        rheumatic heart  . Alcohol abuse Father   . Depression Father   . Lung cancer Sister   . Depression Daughter   . Thyroid disease Daughter        hashimoto  . Depression Son   . Stroke Maternal Grandmother   . Depression Maternal Grandmother   . Hypertension Maternal Grandfather   . Hypertension Paternal Grandfather   . Seizures Son   . Breast cancer Neg Hx    Social History   Socioeconomic History  . Marital status: Married    Spouse name: Not on file  . Number of children: 3  . Years of education: Not on file  . Highest education level: Not on file  Occupational History  . Occupation: Retired Sport and exercise psychologist  Social Needs  . Financial resource strain: Not on file  . Food insecurity:    Worry: Not on file    Inability: Not on file  . Transportation needs:    Medical: Not on file    Non-medical: Not on file  Tobacco Use  . Smoking status: Former Research scientist (life sciences)  . Smokeless tobacco: Never Used  . Tobacco comment: quit 47+ yrs ago  Substance and Sexual Activity  . Alcohol use: No    Alcohol/week: 0.0 standard drinks  . Drug use: No  . Sexual activity: Never  Lifestyle  . Physical activity:    Days  per week: 5 days    Minutes per session: 30 min  . Stress: Not at all  Relationships  . Social connections:    Talks on phone: Not on file    Gets together: Not on file    Attends religious service: Not on file    Active member of club or organization: Not on file    Attends  meetings of clubs or organizations: Not on file    Relationship status: Not on file  Other Topics Concern  . Not on file  Social History Narrative   Lives in [place].  Retired Engineer, drilling.  Relatively active.   Uses a Rollator to ambulate    Outpatient Encounter Medications as of 07/15/2018  Medication Sig  . acetaminophen (TYLENOL) 500 MG tablet Take 1,000 mg by mouth 2 (two) times daily.   [name] ADVAIR DISKUS 250-50 MCG/DOSE AEPB INHALE 1 PUFF BY MOUTH TWICE DAILY (Patient taking differently: INHALE 1 PUFF BY MOUTH TWICE DAILY as needed when she gets allergies or bad colds)  . amitriptyline (ELAVIL) 25 MG tablet Take 25 mg by mouth at bedtime  . apixaban (ELIQUIS) 5 MG TABS tablet Take 1 tablet (5 mg total) by mouth 2 (two) times daily.  . ATROVENT HFA 17 MCG/ACT inhaler inhale 2 puffs INTO THE LUNGS every 6 hours if needed for wheezing  . b complex vitamins tablet Take 1 tablet by mouth daily.  . Cholecalciferol (VITAMIN D3) 2000 UNITS TABS Take 2,000 Units by mouth 2 (two) times a week.   . clopidogrel (PLAVIX) 75 MG tablet Take 1 tablet (75 mg total) by mouth daily with breakfast.  . Coenzyme Q10 (COQ-10) 200 MG CAPS Take 200 mg by mouth daily.   [name] conjugated estrogens (PREMARIN) vaginal cream Apply 0.3m (pea-sized amount)  just inside the vaginal introitus with a finger-tip on  Monday, Wednesday and Friday nights.  . Evolocumab (REPATHA SURECLICK) 1885MG/ML SOAJ Inject 1 pen into the skin every 14 (fourteen) days.  .Marland Kitchenezetimibe (ZETIA) 10 MG tablet Take 1 tablet (10 mg total) by mouth daily.  . ferrous sulfate 325 (65 FE) MG tablet Take 1 tablet (325 mg total) by mouth 2 (two) times daily with a meal.  . folic  acid (FOLVITE) 1 MG tablet Take 1 tablet (1 mg total) by mouth daily.  . furosemide (LASIX) 40 MG tablet Take 1 tablet (40 mg total) by mouth daily.  . hydrocortisone 2.5 % cream Apply to VULVA at bedtime as needed with miconazole for lichen planus  . ketoconazole (NIZORAL) 2 % cream Apply 1 application topically daily as needed (for crack bleeding fingers.).  .Marland Kitchenloratadine (CLARITIN) 10 MG tablet Take 10 mg by mouth daily.  . meclizine (ANTIVERT) 25 MG tablet Take 25 mg by mouth 3 (three) times daily as needed for dizziness.  . miconazole (MICOTIN) 2 % cream Apply 1 application topically at bedtime as needed (with hydrocortisone for lichen planus).   . miconazole (MICOTIN) 2 % powder Apply topically as needed for itching.  . Multiple Vitamins-Minerals (HAIR/SKIN/NAILS PO) Take 1 tablet by mouth daily.  . nitroGLYCERIN (NITROSTAT) 0.4 MG SL tablet Place 1 tablet (0.4 mg total) under the tongue every 5 (five) minutes as needed for chest pain.  . pantoprazole (PROTONIX) 40 MG tablet Take 1 tablet (40 mg total) by mouth 2 (two) times daily.  . polyethylene glycol powder (GLYCOLAX/MIRALAX) powder Take 8.5 g by mouth daily as needed.   . ramipril (ALTACE) 5 MG capsule Take 1 capsule (5 mg total) by mouth daily.  .Marland Kitchenspironolactone (ALDACTONE) 25 MG tablet Take 1 tablet (25 mg total) by mouth every morning.  . traZODone (DESYREL) 150 MG tablet TAKE 1 AND 2/3 TO 2 TABLETS BY MOUTH AT BEDTIME AS DIRECTED (Patient taking differently: Take 250 mg by mouth at bedtime (1 and 2/3 tablets))  . verapamil (CALAN-SR) 180 MG CR tablet Take 1  tablet (180 mg total) by mouth 2 (two) times daily.  . vitamin B-12 (CYANOCOBALAMIN) 1000 MCG tablet Take 1,000 mcg by mouth daily.  . vitamin C (ASCORBIC ACID) 500 MG tablet Take 500 mg by mouth at bedtime.  . [DISCONTINUED] nitrofurantoin, macrocrystal-monohydrate, (MACROBID) 100 MG capsule Take 1 capsule (100 mg total) by mouth every 12 (twelve) hours. (Patient not taking:  Reported on 07/15/2018)   No facility-administered encounter medications on file as of 07/15/2018.     Review of Systems  Constitutional: Negative for appetite change and unexpected weight change.  HENT: Negative for congestion and sinus pressure.   Respiratory: Negative for cough and chest tightness.        Breathing stable.    Cardiovascular: Negative for chest pain, palpitations and leg swelling.  Gastrointestinal: Negative for abdominal pain, diarrhea, nausea and vomiting.  Genitourinary: Negative for difficulty urinating and dysuria.  Musculoskeletal: Negative for myalgias.       Left deltoid swollen and tender - better.    Skin: Negative for color change and rash.  Neurological: Negative for dizziness, light-headedness and headaches.  Psychiatric/Behavioral: Negative for agitation and dysphoric mood.       Objective:    Physical Exam  Constitutional: She appears well-developed and well-nourished. No distress.  HENT:  Nose: Nose normal.  Mouth/Throat: Oropharynx is clear and moist.  Neck: Neck supple. No thyromegaly present.  Cardiovascular: Normal rate.  Rate controlled.    Pulmonary/Chest: Breath sounds normal. No respiratory distress. She has no wheezes.  Abdominal: Soft. Bowel sounds are normal. There is no tenderness.  Musculoskeletal: She exhibits no edema or tenderness.  Increased soft tissue swelling - left upper arm.  Some bruising.  Minimal tenderness.    Lymphadenopathy:    She has no cervical adenopathy.  Skin: No rash noted. No erythema.  Psychiatric: She has a normal mood and affect. Her behavior is normal.    BP 126/74 (BP Location: Left Arm, Patient Position: Sitting, Cuff Size: Normal)   Pulse 76   Temp 98.2 F (36.8 C) (Oral)   Resp 16   Ht 5' 3" (1.6 m)   Wt 173 lb 6 oz (78.6 kg)   SpO2 97%   BMI 30.71 kg/m  Wt Readings from Last 3 Encounters:  07/15/18 173 lb 6 oz (78.6 kg)  06/24/18 174 lb 8 oz (79.2 kg)  05/07/18 174 lb (78.9 kg)      Lab Results  Component Value Date   WBC 3.7 (L) 07/15/2018   HGB 13.2 07/15/2018   HCT 39.2 07/15/2018   PLT 225.0 07/15/2018   GLUCOSE 103 (H) 07/15/2018   CHOL 128 07/15/2018   TRIG 104.0 07/15/2018   HDL 60.10 07/15/2018   LDLDIRECT 126.7 07/03/2013   LDLCALC 48 07/15/2018   ALT 19 07/15/2018   AST 21 07/15/2018   NA 135 07/15/2018   K 4.4 07/15/2018   CL 100 07/15/2018   CREATININE 1.00 07/15/2018   BUN 22 07/15/2018   CO2 27 07/15/2018   TSH 2.962 03/30/2018   INR 1.34 03/28/2018   HGBA1C 4.8 03/28/2018    Us Renal  Result Date: 07/02/2018 CLINICAL DATA:  Recurring urinary tract infection EXAM: RENAL / URINARY TRACT ULTRASOUND COMPLETE COMPARISON:  None. FINDINGS: Right Kidney: Length: 10.5 cm. Echogenicity within normal limits. No mass or hydronephrosis visualized. Left Kidney: Length: 9.6 cm. Echogenicity within normal limits. No mass or hydronephrosis visualized. Bladder: Appears normal for degree of bladder distention. Left ureteral jet is not seen. IMPRESSION: Normal renal ultrasound.   Electronically Signed   By: Abelardo Diesel M.D.   On: 07/02/2018 08:35       Assessment & Plan:   Problem List Items Addressed This Visit    Acute on chronic diastolic CHF (congestive heart failure) ([place])    Followed by cardiology.  Appears to be euvolemic.  Continue current medication regimen.  Stable.        Anemia    Follow cbc and ferritin.        Asthma    Breathing stable.        CAD (coronary artery disease)    S/p DES placement. Continues on plavix and eliquis.  Followed by cardiology.  Stable.        Depression    Stable on current regimen.  Follow.        Essential hypertension, benign    Blood pressure under good control.  Continue same medication regimen.  Follow pressures.  Follow metabolic panel.        Frequent UTI    Saw urology.  Renal ultrasound ok.  Taking cranberry pills.  Using estrogen cream.  Currently doing well.        GERD  (gastroesophageal reflux disease)    Controlled on current regimen.        Hypercholesterolemia    On zetia.  Low cholesterol diet and exercise.  Follow lipid panel.        Hyponatremia    Recheck sodium level today.  Appears to be euvolemic.        Persistent atrial fibrillation    On eliquis.  Currently doing well.  Followed by cardiology.        Shoulder pain    Previous left deltoid swelling and tenderness.  Better.  Still with bilateral shoulder pain. May be related to her back/neck.  Request esr.  Desires no further intervention.  Follow.        Relevant Orders   Sedimentation rate (Completed)   Spinal stenosis    Chronic back and leg pain.  Saw Dr Sharlet Salina.  Currently stable.        Vitamin D deficiency    Follow vitamin D level.         Other Visit Diagnoses    Hyperglycemia    -  Primary   Relevant Orders   POCT Glucose (CBG) (Completed)       [name], MD

## 2018-07-16 ENCOUNTER — Encounter: Payer: Self-pay | Admitting: *Deleted

## 2018-07-17 ENCOUNTER — Telehealth: Payer: Self-pay | Admitting: Internal Medicine

## 2018-07-17 NOTE — Telephone Encounter (Signed)
rx request 

## 2018-07-17 NOTE — Telephone Encounter (Signed)
Copied from [place] [id number]. Topic: Quick Communication - Rx Refill/Question >> Jul 17, 2018  1:00 PM Gardiner Ramus wrote: Medication: tramadol, Patient called and stated that she seen Dr. Nicki Reaper on 07/15/18. Pt states that Dr. Nicki Reaper was going to send in tramadol. Pt would like to know if this can be called into [id number] [place], [place] - [address] [phone] (Phone) [phone] (Fax)

## 2018-07-20 ENCOUNTER — Encounter: Payer: Self-pay | Admitting: Internal Medicine

## 2018-07-20 DIAGNOSIS — M25519 Pain in unspecified shoulder: Secondary | ICD-10-CM | POA: Insufficient documentation

## 2018-07-20 NOTE — Assessment & Plan Note (Signed)
Previous left deltoid swelling and tenderness.  Better.  Still with bilateral shoulder pain. May be related to her back/neck.  Request esr.  Desires no further intervention.  Follow.

## 2018-07-20 NOTE — Assessment & Plan Note (Signed)
Saw urology.  Renal ultrasound ok.  Taking cranberry pills.  Using estrogen cream.  Currently doing well.

## 2018-07-20 NOTE — Assessment & Plan Note (Signed)
On zetia.  Low cholesterol diet and exercise.  Follow lipid panel.  

## 2018-07-20 NOTE — Assessment & Plan Note (Signed)
Chronic back and leg pain.  Saw Dr Sharlet Salina.  Currently stable.

## 2018-07-20 NOTE — Assessment & Plan Note (Signed)
Follow vitamin D level.  

## 2018-07-20 NOTE — Assessment & Plan Note (Signed)
Recheck sodium level today.  Appears to be euvolemic.

## 2018-07-20 NOTE — Assessment & Plan Note (Signed)
Blood pressure under good control.  Continue same medication regimen.  Follow pressures.  Follow metabolic panel.   

## 2018-07-20 NOTE — Assessment & Plan Note (Signed)
S/p DES placement.  Continues on plavix and eliquis.  Followed by cardiology.  Stable.   

## 2018-07-20 NOTE — Assessment & Plan Note (Signed)
Breathing stable.

## 2018-07-20 NOTE — Assessment & Plan Note (Signed)
Follow cbc and ferritin.  

## 2018-07-20 NOTE — Assessment & Plan Note (Signed)
Controlled on current regimen.   

## 2018-07-20 NOTE — Assessment & Plan Note (Signed)
On eliquis.  Currently doing well.  Followed by cardiology.

## 2018-07-20 NOTE — Assessment & Plan Note (Signed)
Followed by cardiology.  Appears to be euvolemic.  Continue current medication regimen.  Stable.

## 2018-07-20 NOTE — Assessment & Plan Note (Signed)
Stable on current regimen.  Follow.   

## 2018-07-21 MED ORDER — TRAMADOL HCL 50 MG PO TABS: 50.0000 mg | ORAL_TABLET | Freq: Two times a day (BID) | ORAL | 0 refills | Status: DC | PRN

## 2018-07-21 NOTE — Telephone Encounter (Signed)
Left detailed message on patient voice mail per DPR.

## 2018-07-21 NOTE — Telephone Encounter (Signed)
Let her know that I did not get the message.  I have sent in #30 tablets for ultram.  We do not have down that she is taking this regularly.  I sent in a temporary supply.  Let me know if problems

## 2018-07-21 NOTE — Telephone Encounter (Signed)
Copied from [place] [id number]. Topic: General - Other >> Jul 18, 2018  5:30 PM Yvette Rack wrote: Reason for CRM: Pt states that she is very disappointed that the tramadol 50 mg was not called in to her pharmacy after speaking with Dr. Nicki Reaper on 07/15/18. Pt states she would like to make Dr Nicki Reaper aware that she has to go through the weekend with no medication for her Sciatica

## 2018-07-22 ENCOUNTER — Ambulatory Visit: Payer: Medicare Other | Attending: Infectious Diseases | Admitting: Infectious Diseases

## 2018-07-22 ENCOUNTER — Other Ambulatory Visit: Payer: Self-pay | Admitting: Internal Medicine

## 2018-07-22 VITALS — BP 116/78 | HR 96 | Temp 97.6°F

## 2018-07-22 DIAGNOSIS — Z886 Allergy status to analgesic agent status: Secondary | ICD-10-CM | POA: Diagnosis not present

## 2018-07-22 DIAGNOSIS — I2510 Atherosclerotic heart disease of native coronary artery without angina pectoris: Secondary | ICD-10-CM

## 2018-07-22 DIAGNOSIS — R350 Frequency of micturition: Secondary | ICD-10-CM

## 2018-07-22 DIAGNOSIS — Z955 Presence of coronary angioplasty implant and graft: Secondary | ICD-10-CM | POA: Diagnosis not present

## 2018-07-22 DIAGNOSIS — R32 Unspecified urinary incontinence: Secondary | ICD-10-CM | POA: Diagnosis not present

## 2018-07-22 DIAGNOSIS — Z87891 Personal history of nicotine dependence: Secondary | ICD-10-CM | POA: Diagnosis not present

## 2018-07-22 DIAGNOSIS — L900 Lichen sclerosus et atrophicus: Secondary | ICD-10-CM | POA: Diagnosis not present

## 2018-07-22 DIAGNOSIS — K5900 Constipation, unspecified: Secondary | ICD-10-CM

## 2018-07-22 DIAGNOSIS — K219 Gastro-esophageal reflux disease without esophagitis: Secondary | ICD-10-CM | POA: Insufficient documentation

## 2018-07-22 DIAGNOSIS — Z79899 Other long term (current) drug therapy: Secondary | ICD-10-CM | POA: Diagnosis not present

## 2018-07-22 DIAGNOSIS — N189 Chronic kidney disease, unspecified: Secondary | ICD-10-CM | POA: Diagnosis not present

## 2018-07-22 DIAGNOSIS — I471 Supraventricular tachycardia: Secondary | ICD-10-CM | POA: Diagnosis not present

## 2018-07-22 DIAGNOSIS — Z96653 Presence of artificial knee joint, bilateral: Secondary | ICD-10-CM | POA: Insufficient documentation

## 2018-07-22 DIAGNOSIS — Z87440 Personal history of urinary (tract) infections: Secondary | ICD-10-CM | POA: Diagnosis not present

## 2018-07-22 DIAGNOSIS — R300 Dysuria: Secondary | ICD-10-CM | POA: Diagnosis not present

## 2018-07-22 DIAGNOSIS — Z880 Allergy status to penicillin: Secondary | ICD-10-CM

## 2018-07-22 DIAGNOSIS — M353 Polymyalgia rheumatica: Secondary | ICD-10-CM | POA: Insufficient documentation

## 2018-07-22 DIAGNOSIS — F329 Major depressive disorder, single episode, unspecified: Secondary | ICD-10-CM | POA: Diagnosis not present

## 2018-07-22 DIAGNOSIS — Z881 Allergy status to other antibiotic agents status: Secondary | ICD-10-CM | POA: Diagnosis not present

## 2018-07-22 DIAGNOSIS — R05 Cough: Secondary | ICD-10-CM | POA: Diagnosis not present

## 2018-07-22 DIAGNOSIS — E559 Vitamin D deficiency, unspecified: Secondary | ICD-10-CM | POA: Diagnosis not present

## 2018-07-22 DIAGNOSIS — N3289 Other specified disorders of bladder: Secondary | ICD-10-CM

## 2018-07-22 DIAGNOSIS — I4891 Unspecified atrial fibrillation: Secondary | ICD-10-CM

## 2018-07-22 DIAGNOSIS — I1310 Hypertensive heart and chronic kidney disease without heart failure, with stage 1 through stage 4 chronic kidney disease, or unspecified chronic kidney disease: Secondary | ICD-10-CM | POA: Diagnosis not present

## 2018-07-22 DIAGNOSIS — E785 Hyperlipidemia, unspecified: Secondary | ICD-10-CM | POA: Diagnosis not present

## 2018-07-22 DIAGNOSIS — K449 Diaphragmatic hernia without obstruction or gangrene: Secondary | ICD-10-CM | POA: Insufficient documentation

## 2018-07-22 DIAGNOSIS — J45909 Unspecified asthma, uncomplicated: Secondary | ICD-10-CM | POA: Diagnosis not present

## 2018-07-22 DIAGNOSIS — Z7901 Long term (current) use of anticoagulants: Secondary | ICD-10-CM | POA: Insufficient documentation

## 2018-07-22 DIAGNOSIS — Z882 Allergy status to sulfonamides status: Secondary | ICD-10-CM | POA: Diagnosis not present

## 2018-07-22 DIAGNOSIS — Z951 Presence of aortocoronary bypass graft: Secondary | ICD-10-CM | POA: Diagnosis not present

## 2018-07-22 DIAGNOSIS — Z888 Allergy status to other drugs, medicaments and biological substances status: Secondary | ICD-10-CM

## 2018-07-22 DIAGNOSIS — Z8619 Personal history of other infectious and parasitic diseases: Secondary | ICD-10-CM

## 2018-07-22 DIAGNOSIS — R0602 Shortness of breath: Secondary | ICD-10-CM | POA: Diagnosis not present

## 2018-07-22 DIAGNOSIS — N952 Postmenopausal atrophic vaginitis: Secondary | ICD-10-CM | POA: Insufficient documentation

## 2018-07-22 DIAGNOSIS — N390 Urinary tract infection, site not specified: Secondary | ICD-10-CM

## 2018-07-22 NOTE — Progress Notes (Signed)
NAME: Charlotte Sessions, MD  DOB: 1936/06/06  MRN: [id number]  Date/Time: 07/22/2018 12:01 PM Subjective:  REASON FOR CONSULT: Recurrent UTI ? Charlotte Sessions, MD is a 82 y.o. with a history of CAD, status post CABG, A. Fib, is referred to me for discussion regarding recurrent urinary tract infections. 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  She states she has had a UTI since the time she first had sex.  She has not had any sexual intercourse since 1977.  She usually has symptoms of urgency, increased frequency and sometimes explosive incontinence and  bladder spasms.  She used to live in [place] and was on [place] as a suppressive therapy for many years.  Since moving here she has not been on any medication.  In June 2019 she was hospitalized with E. coli bacteremia and was treated.  After that she has had a Proteus mirabilis in the urine which was resistant to nitrofurantoin and tetracycline.  She has been seen by urology team in September 2019 and a PVR did not show any residual urine except for 60 cc.  During that visit she was given estrogen cream to be applied topically and also to use cranberry supplements .  Since then she has been feeling better.  She has not had any symptoms of dysuria or urgency now.  She does wear incontinence pads  Past Medical History:  Diagnosis Date  . Allergy   . Arthritis    s/p bilateral knees and left hip replacement  . Asthma   . CAD (coronary artery disease)    a. 12/1996 s/p CABG x4 ([place]);  b. 2005 Pt reports stress test & cath, which revealed patent grafts.  . Cancer ([place])    melanoma right arm  . Carotid arterial disease ([place])    a. 04/2015 Carotid U/S: <50% bilat ICA stenosis.  . Chronic kidney disease   . Colon polyps    H/O  . Depression   . GERD (gastroesophageal reflux disease)    h/o hiatal hernia  . Headache    migraines in past  . Heart murmur    a. 04/2011 Echo: EF 55-60%, bilat atrial  enlargement, mild to mod TR.  [name] History of chicken pox   . History of hiatal hernia   . Hx of migraines    rare now  . Hx: UTI (urinary tract infection)   . Hyperlipidemia    a. Statin intolerant -->on zetia.  . Hypertension   . Hypertensive heart disease   . Lichen planus   . Melanoma ([place])   . Palpitations    a. rare PVC's and h/o SVT.  [name] PMR (polymyalgia rheumatica) (H[facility])    h/o in setting of crestor usage.  [name] PSVT (paroxysmal supraventricular tachycardia) ([place])   . PUD (peptic ulcer disease)    remote history  . Raynaud's phenomenon   . Spinal stenosis   . Urine incontinence    H/O  . Vitamin D deficiency     Past Surgical History:  Procedure Laterality Date  . ADENOIDECTOMY     age 63  . BACK SURGERY     L3-L5  . BILATERAL CARPAL TUNNEL RELEASE    . BREAST BIOPSY Right    bx x 3 neg  . BREAST SURGERY Right    biopsy x 3 (all benign)  . CARDIAC CATHETERIZATION N/A 06/01/2016   Procedure: LEFT HEART CATH AND CORS/GRAFTS ANGIOGRAPHY;  Surgeon: Wellington Hampshire, MD;  Location: [facility];  Service: Cardiovascular;  Laterality: N/A;  . CARDIAC CATHETERIZATION N/A 06/01/2016   Procedure: Coronary Stent Intervention;  Surgeon: Wellington Hampshire, MD;  Location: [facility];  Service: Cardiovascular;  Laterality: N/A;  . CARDIOVERSION N/A 03/08/2017   Procedure: CARDIOVERSION;  Surgeon: Wellington Hampshire, MD;  Location: [facility];  Service: Cardiovascular;  Laterality: N/A;  . CATARACT EXTRACTION W/PHACO Right 01/04/2016   Procedure: CATARACT EXTRACTION PHACO AND INTRAOCULAR LENS PLACEMENT ([place]);  Surgeon: Leandrew Koyanagi, MD;  Location: [facility];  Service: Ophthalmology;  Laterality: Right;  MALYUGIN  . CATARACT EXTRACTION W/PHACO Left 01/25/2016   Procedure: CATARACT EXTRACTION PHACO AND INTRAOCULAR LENS PLACEMENT ([place]) left eye;  Surgeon: Leandrew Koyanagi, MD;  Location: [facility];  Service: Ophthalmology;  Laterality: Left;  MALYUGIN  SHUGARCAINE  . CHOLECYSTECTOMY  90's  . COLONOSCOPY WITH PROPOFOL N/A 05/03/2015   Procedure: COLONOSCOPY WITH PROPOFOL;  Surgeon: Lollie Sails, MD;  Location: [facility];  Service: Endoscopy;  Laterality: N/A;  . CORONARY ARTERY BYPASS GRAFT  98   4 vessel  . CORONARY STENT INTERVENTION N/A 03/31/2018   Procedure: CORONARY STENT INTERVENTION;  Surgeon: Wellington Hampshire, MD;  Location: [place] [facility];  Service: Cardiovascular;  Laterality: N/A;  . ESOPHAGOGASTRODUODENOSCOPY N/A 03/22/2015   Procedure: ESOPHAGOGASTRODUODENOSCOPY (EGD);  Surgeon: Lollie Sails, MD;  Location: [facility] ENDOSCOPY;  Service: Endoscopy;  Laterality: N/A;  . HEMORRHOID BANDING    . HEMORRHOID SURGERY N/A 02/17/2018   Procedure: HEMORRHOIDECTOMY;  Surgeon: Robert Bellow, MD;  Location: [facility];  Service: General;  Laterality: N/A;  . JOINT REPLACEMENT     BILATERAL KNEE REPLACEMENTS  . KNEE ARTHROSCOPY W/ [place]ATS PROCEDURE     Lt knee (9/01), Rt knee (3/11), Lt hip (5/10)  . LEFT HEART CATH AND CORONARY ANGIOGRAPHY N/A 03/31/2018   Procedure: LEFT HEART CATH AND CORONARY ANGIOGRAPHY;  Surgeon: Wellington Hampshire, MD;  Location: [place] [facility];  Service: Cardiovascular;  Laterality: N/A;  . TOTAL HIP ARTHROPLASTY      Social History   Socioeconomic History  . Marital status: Married    Spouse name: Not on file  . Number of children: 3  . Years of education: Not on file  . Highest education level: Not on file  Occupational History  . Occupation: Retired Sport and exercise psychologist  Social Needs  . Financial resource strain: Not on file  . Food insecurity:    Worry: Not on file    Inability: Not on file  . Transportation needs:    Medical: Not on file    Non-medical: Not on file  Tobacco Use  . Smoking status: Former Research scientist (life sciences)  . Smokeless tobacco: Never Used  . Tobacco comment: quit 47+ yrs ago  Substance and Sexual Activity  . Alcohol use: No    Alcohol/week: 0.0 standard drinks  . Drug use:  No  . Sexual activity: Never  Lifestyle  . Physical activity:    Days per week: 5 days    Minutes per session: 30 min  . Stress: Not at all  Relationships  . Social connections:    Talks on phone: Not on file    Gets together: Not on file    Attends religious service: Not on file    Active member of club or organization: Not on file    Attends meetings of clubs or organizations: Not on file    Relationship status: Not on file  . Intimate partner violence:  Fear of current or ex partner: Not on file    Emotionally abused: Not on file    Physically abused: Not on file    Forced sexual activity: Not on file  Other Topics Concern  . Not on file  Social History Narrative   Lives in [place].  Retired Engineer, drilling.  Relatively active.   Uses a Rollator to ambulate    Family History  Problem Relation Age of Onset  . Thyroid disease Mother        graves disease  . Heart disease Father        rheumatic heart  . Alcohol abuse Father   . Depression Father   . Lung cancer Sister   . Depression Daughter   . Thyroid disease Daughter        hashimoto  . Depression Son   . Stroke Maternal Grandmother   . Depression Maternal Grandmother   . Hypertension Maternal Grandfather   . Hypertension Paternal Grandfather   . Seizures Son   . Breast cancer Neg Hx    Allergies  Allergen Reactions  . Beta Adrenergic Blockers Shortness Of Breath    DEPRESSION/HYPOTENSION  . Brilinta [Ticagrelor] Shortness Of Breath    Caused AFIB  . Albuterol     rigors  . Antihistamines, Diphenhydramine-Type     Muscle spasms  . Aspirin Other (See Comments)    Heartburn   . Nsaids Other (See Comments)    ULCER HISTORY & CURRENTLY ON BLOOD THINNERS  . Penicillins Hives and Other (See Comments)    Has patient had a PCN reaction causing immediate rash, facial/tongue/throat swelling, SOB or lightheadedness with hypotension: No Has patient had a PCN reaction causing severe rash involving mucus membranes  or skin necrosis: No Has patient had a PCN reaction that required hospitalization No Has patient had a PCN reaction occurring within the last 10 years: No If all of the above answers are "NO", then may proceed with Cephalosporin use.   . Statins Other (See Comments)    Muscle weakness  . Sulfa Antibiotics Rash    At mouth  . Sulfasalazine Rash    At mouth  . Tetracycline Rash  . Tetracyclines & Related Rash   ? Current Outpatient Medications  Medication Sig Dispense Refill  . acetaminophen (TYLENOL) 500 MG tablet Take 1,000 mg by mouth 2 (two) times daily.     [name] ADVAIR DISKUS 250-50 MCG/DOSE AEPB INHALE 1 PUFF BY MOUTH TWICE DAILY (Patient taking differently: INHALE 1 PUFF BY MOUTH TWICE DAILY as needed when she gets allergies or bad colds) 14 each 0  . amitriptyline (ELAVIL) 25 MG tablet Take 25 mg by mouth at bedtime 60 tablet 5  . apixaban (ELIQUIS) 5 MG TABS tablet Take 1 tablet (5 mg total) by mouth 2 (two) times daily. 60 tablet 11  . ATROVENT HFA 17 MCG/ACT inhaler inhale 2 puffs INTO THE LUNGS every 6 hours if needed for wheezing 12.9 g 2  . b complex vitamins tablet Take 1 tablet by mouth daily.    . Cholecalciferol (VITAMIN D3) 2000 UNITS TABS Take 2,000 Units by mouth 2 (two) times a week.     . clopidogrel (PLAVIX) 75 MG tablet Take 1 tablet (75 mg total) by mouth daily with breakfast. 90 tablet 3  . Coenzyme Q10 (COQ-10) 200 MG CAPS Take 200 mg by mouth daily.     [name] conjugated estrogens (PREMARIN) vaginal cream Apply 0.5mg  (pea-sized amount)  just inside the vaginal introitus with a  finger-tip on  Monday, Wednesday and Friday nights. 30 g 12  . Evolocumab (REPATHA SURECLICK) 299 MG/ML SOAJ Inject 1 pen into the skin every 14 (fourteen) days. 2 pen 11  . ezetimibe (ZETIA) 10 MG tablet Take 1 tablet (10 mg total) by mouth daily. 90 tablet 3  . ferrous sulfate 325 (65 FE) MG tablet Take 1 tablet (325 mg total) by mouth 2 (two) times daily with a meal. 60 tablet 3  . folic acid  (FOLVITE) 1 MG tablet Take 1 tablet (1 mg total) by mouth daily. 30 tablet 5  . furosemide (LASIX) 40 MG tablet Take 1 tablet (40 mg total) by mouth daily. 90 tablet 3  . hydrocortisone 2.5 % cream Apply to VULVA at bedtime as needed with miconazole for lichen planus 28 g 0  . ketoconazole (NIZORAL) 2 % cream Apply 1 application topically daily as needed (for crack bleeding fingers.).    [name] loratadine (CLARITIN) 10 MG tablet Take 10 mg by mouth daily.    . meclizine (ANTIVERT) 25 MG tablet Take 25 mg by mouth 3 (three) times daily as needed for dizziness.    . miconazole (MICOTIN) 2 % cream Apply 1 application topically at bedtime as needed (with hydrocortisone for lichen planus).     . miconazole (MICOTIN) 2 % powder Apply topically as needed for itching. 70 g 0  . Multiple Vitamins-Minerals (HAIR/SKIN/NAILS PO) Take 1 tablet by mouth daily.    . nitroGLYCERIN (NITROSTAT) 0.4 MG SL tablet Place 1 tablet (0.4 mg total) under the tongue every 5 (five) minutes as needed for chest pain. 25 tablet 6  . pantoprazole (PROTONIX) 40 MG tablet Take 1 tablet (40 mg total) by mouth 2 (two) times daily. 180 tablet 1  . polyethylene glycol powder (GLYCOLAX/MIRALAX) powder Take 8.5 g by mouth daily as needed.     . ramipril (ALTACE) 5 MG capsule Take 1 capsule (5 mg total) by mouth daily. 90 capsule 3  . spironolactone (ALDACTONE) 25 MG tablet Take 1 tablet (25 mg total) by mouth every morning. 90 tablet 3  . traMADol (ULTRAM) 50 MG tablet Take 1 tablet (50 mg total) by mouth 2 (two) times daily as needed. 30 tablet 0  . traZODone (DESYREL) 150 MG tablet TAKE 1 AND 2/3 TO 2 TABLETS BY MOUTH AT BEDTIME AS DIRECTED (Patient taking differently: Take 250 mg by mouth at bedtime (1 and 2/3 tablets)) 180 tablet 2  . verapamil (CALAN-SR) 180 MG CR tablet Take 1 tablet (180 mg total) by mouth 2 (two) times daily. 180 tablet 4  . vitamin B-12 (CYANOCOBALAMIN) 1000 MCG tablet Take 1,000 mcg by mouth daily.    . vitamin C  (ASCORBIC ACID) 500 MG tablet Take 500 mg by mouth at bedtime.     No current facility-administered medications for this visit.      Abtx:  Anti-infectives (From admission, onward)   None      REVIEW OF SYSTEMS:  Const: negative fever, negative chills, negative weight loss Eyes: negative diplopia or visual changes, negative eye pain ENT: negative coryza, negative sore throat Resp: Cough and shortness of breath Cards: negative for chest pain, has palpitations, lower extremity edema GU: As above GI: Negative for abdominal pain, diarrhea, bleeding, has constipation and takes MiraLAX Skin: Negative for rash or itching  excessive bruising skin MS: Has myalgias, arthralgias, back pain and muscle weakness Neurolo:negative for headaches, dizziness, vertigo, memory problems  Psych: negative for feelings of anxiety, depression  Endocrine: No diabetes  Allergy/Immunology-multiple medication allergies Objective:  VITALS:  BP 116/78 (BP Location: Left Arm, Patient Position: Sitting, Cuff Size: Normal)   Pulse 96   Temp 97.6 F (36.4 C) (Oral)  PHYSICAL EXAM:  General: Alert, cooperative, no distress, appears stated age.  Pale has a walker Head: Normocephalic, without obvious abnormality, atraumatic. Eyes: Conjunctivae clear, anicteric sclerae. Pupils are equal ENT Nares normal. No drainage or sinus tenderness. Moist Neck: Supple, symmetrical, no adenopathy, thyroid: non tender no carotid bruit and no JVD. Lungs: Clear to auscultation bilaterally. No Wheezing or Rhonchi. No rales. Heart: irregular rhythm abdomen:  Soft, non-tender,not distended. Bowel sounds normal. No masses Extremities: atraumatic, no cyanosis. No edema. No clubbing, bilateral knee scars Skin:  bruising Lymph: Cervical, supraclavicular normal. Neurologic: Grossly non-focal Pertinent Labs Reviewed urine culture and blood culture reports from the past   IMAGING RESULTS: ?None  recently  Impression/Recommendation ?82 y.o. with a history of CAD, status post CABG, A. Fib, is referred to me for discussion regarding recurrent urinary tract infections.  ?Genitourinary syndrome of menopause with symptoms of increased frequency, burning and incontinence.  Also has bladder spasm. The symptoms could be due to atrophic mucus of the genital and urinary tract, could also be due to overactive bladder, or urinary tract infection.  She is at risk for urinary tract infection especially because of the atrophic vaginitis. Since starting Premarin topical application by the urologist she is doing better.  I asked her to continue to apply it 3 times a week. She has a history of lichen sclerosis of the vagina .  ? Reinforced proper hygiene after defecation, avoiding constipation, use of cranberry supplements or juice and staying hydrated. He has a history of E. coli bacteremia and E. coli in the urine.  The E. coli has been resistant to Cipro, Bactrim and amoxicillin. She had an episode of Proteus mirabilis in the urine. We will avoid routine testing of the urine during office visits.  This is because she is found to have pyuria and bacteriuria and treating her with antibiotics for asymptomatic bacteriuria can lead to multidrug resistant organisms. It may be useful  to do a cystoscopy in the future if symptoms recur or persist to look for any interstitial cystitis or for any bladder pathology. She did not have significant residual volume the first time.  But it may be worth repeating it if the symptoms persist.  Will not treat asymptomatic bacteriuria.   Coronary artery disease status post CABG and recently stents.  Is on appropriate management   A. Fib Management as per cardiology  _________________________________________________ Discussed management with the patient in great detail.

## 2018-07-22 NOTE — Patient Instructions (Signed)
You have been referred to me for recurrent UTI. You had in June E.coli bacteremia. Currently you dont have any symptoms You likely has genitourinary menopausal symptoms- the last bladder scan did not show any residual urine.there is no evidence of renal stone You may have an overactive bladder . Would not recommend suppressive therapy with antibiotics. You started  Premarin last week / cranrx/proper hygiene/hydration/avoiding constipation. If your symptoms recur or persistent then a cystoscopy may help.

## 2018-07-23 DIAGNOSIS — R490 Dysphonia: Secondary | ICD-10-CM | POA: Diagnosis not present

## 2018-07-23 DIAGNOSIS — H6120 Impacted cerumen, unspecified ear: Secondary | ICD-10-CM | POA: Diagnosis not present

## 2018-07-23 DIAGNOSIS — J3801 Paralysis of vocal cords and larynx, unilateral: Secondary | ICD-10-CM | POA: Diagnosis not present

## 2018-08-07 DIAGNOSIS — H6122 Impacted cerumen, left ear: Secondary | ICD-10-CM | POA: Diagnosis not present

## 2018-08-07 DIAGNOSIS — H60332 Swimmer's ear, left ear: Secondary | ICD-10-CM | POA: Diagnosis not present

## 2018-08-21 DIAGNOSIS — D485 Neoplasm of uncertain behavior of skin: Secondary | ICD-10-CM | POA: Diagnosis not present

## 2018-08-21 DIAGNOSIS — Z85828 Personal history of other malignant neoplasm of skin: Secondary | ICD-10-CM | POA: Diagnosis not present

## 2018-08-21 DIAGNOSIS — C44311 Basal cell carcinoma of skin of nose: Secondary | ICD-10-CM | POA: Diagnosis not present

## 2018-08-21 DIAGNOSIS — Z85820 Personal history of malignant melanoma of skin: Secondary | ICD-10-CM | POA: Diagnosis not present

## 2018-08-21 DIAGNOSIS — Z08 Encounter for follow-up examination after completed treatment for malignant neoplasm: Secondary | ICD-10-CM | POA: Diagnosis not present

## 2018-08-22 NOTE — Telephone Encounter (Signed)
See previous note

## 2018-08-24 ENCOUNTER — Other Ambulatory Visit: Payer: Self-pay | Admitting: Internal Medicine

## 2018-08-24 NOTE — Progress Notes (Signed)
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 other    3 mo  f/u. Medications reviewed verbally.     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 female atrial fibrillation accompanied by bradycardia.  coronary artery disease  prior bypass surgery 1998 stent to  graft 8/17  Echo EF 1/18 with EF 60-65%   moderate to severe left atrial enlargement cardioversion that did not hold,  reverted to atrial fibrillation Did not feel well on b-blocker, better on ca channel blocker Statin intolerance , on Repatha Who presents for routine follow-up of her chronic stable angina, atrial fibrillation   In follow-up she reports that she is trying to do her daily exercises Recent fall on the machines, hurt her left arm, bruising Lives at [facility] [place], trying to do her leg strengthening PT  Weight down 6 pounds in one year  Left lower extremity edema, wears compression hose, stable Minimal swelling right lower extremity Trying to moderate her fluid intake, salt intake Lasix 40 mg daily Has not had to take extra Lasix  Lab work reviewed Total chol 128, LDL 48  EKG personally reviewed by myself on todays visit Shows atrial fibrillation ventricular rate 66 bpm  hospital admission for acute on chronic diastolic and systolic CHF Cath 5/42/7062 .  Significant underlying three-vessel coronary artery disease with patent grafts including LIMA to LAD, SVG to RCA, SVG to OM and SVG to diagonal.  Patent stent in the SVG to RCA.  Severe new stenosis and SVG to diagonal is likely the culprit. 2.  Left ventricular angiography was not performed. 3.  High normal left ventricular end-diastolic pressure at 12 mmHg. 4.  Successful direct stenting of SVG to diagonal using a distal protection device.   Prior to cardiac catheterization and stent placement ejection fraction was in the range  of 30% to 35%. Hypokinesis of the anteroseptal myocardium, apical,  anterior myocardium.  - Mitral valve: There was mild regurgitation. - Left atrium: The atrium was mildly to moderately dilated. - Right ventricle: Systolic function was moderately to   severely reduced. - Right atrium: The atrium was mildly dilated.  patent foramen ovale. - Tricuspid valve: moderate-severe regurgitation. - Pulmonary arteries: Systolic pressure was moderate to severely elevated PA peak pressure: 58 mm Hg (S).  long history of Statin intolerance, tried 4 different medications, had severe myalgias,  Lipitor,crestor, simvastatin all had severe side effects  on Zetia, tolerating this Cholesterol still above goal, LDL 112 on Zetia alone  History of bleeding from hemorrhoids. Stable recently  Episode of amnesia 03/12/2017 Workup including EEG No further episodes since that time  Carotid ultrasound reviewed with her in detail Moderate atherosclerotic plaque over the left carotid bulb    PMH:   has a past medical history of Allergy, Arthritis, Asthma, CAD (coronary artery disease), Cancer ([place]), Carotid arterial disease ([place]), Chronic kidney disease, Colon polyps, Depression, GERD (gastroesophageal reflux disease), Headache, Heart murmur, History of chicken pox, History of hiatal hernia, migraines, UTI (urinary tract infection), Hyperlipidemia, Hypertension, Hypertensive heart disease, Lichen planus, Melanoma ([place]), Palpitations, PMR (polymyalgia rheumatica) ([place]), PSVT (paroxysmal supraventricular tachycardia) ([place]), PUD (peptic ulcer disease), Raynaud's phenomenon, Spinal stenosis, Urine incontinence, and Vitamin D deficiency.  PSH:    Past Surgical History:  Procedure Laterality Date  . ADENOIDECTOMY     age 81  . BACK  SURGERY     L3-L5  . BILATERAL CARPAL TUNNEL RELEASE    . BREAST BIOPSY Right    bx x 3 neg  . BREAST SURGERY Right    biopsy x 3 (all benign)  . CARDIAC CATHETERIZATION N/A 06/01/2016    Procedure: LEFT HEART CATH AND CORS/GRAFTS ANGIOGRAPHY;  Surgeon: Wellington Hampshire, MD;  Location: [facility];  Service: Cardiovascular;  Laterality: N/A;  . CARDIAC CATHETERIZATION N/A 06/01/2016   Procedure: Coronary Stent Intervention;  Surgeon: Wellington Hampshire, MD;  Location: [facility];  Service: Cardiovascular;  Laterality: N/A;  . CARDIOVERSION N/A 03/08/2017   Procedure: CARDIOVERSION;  Surgeon: Wellington Hampshire, MD;  Location: [facility];  Service: Cardiovascular;  Laterality: N/A;  . CATARACT EXTRACTION W/PHACO Right 01/04/2016   Procedure: CATARACT EXTRACTION PHACO AND INTRAOCULAR LENS PLACEMENT ([place]);  Surgeon: Leandrew Koyanagi, MD;  Location: [facility];  Service: Ophthalmology;  Laterality: Right;  MALYUGIN  . CATARACT EXTRACTION W/PHACO Left 01/25/2016   Procedure: CATARACT EXTRACTION PHACO AND INTRAOCULAR LENS PLACEMENT ([place]) left eye;  Surgeon: Leandrew Koyanagi, MD;  Location: [facility];  Service: Ophthalmology;  Laterality: Left;  MALYUGIN SHUGARCAINE  . CHOLECYSTECTOMY  90's  . COLONOSCOPY WITH PROPOFOL N/A 05/03/2015   Procedure: COLONOSCOPY WITH PROPOFOL;  Surgeon: Lollie Sails, MD;  Location[facility];  Service: Endoscopy;  Laterality: N/A;  . CORONARY ARTERY BYPASS GRAFT  98   4 vessel  . CORONARY STENT INTERVENTION N/A 03/31/2018   Procedure: CORONARY STENT INTERVENTION;  Surgeon: Wellington Hampshire, MD;  Location: [facility];  Service: Cardiovascular;  Laterality: N/A;  . ESOPHAGOGASTRODUODENOSCOPY N/A 03/22/2015   Procedure: ESOPHAGOGASTRODUODENOSCOPY (EGD);  Surgeon: Lollie Sails, MD;  Location: [facility];  Service: Endoscopy;  Laterality: N/A;  . HEMORRHOID BANDING    . HEMORRHOID SURGERY N/A 02/17/2018   Procedure: HEMORRHOIDECTOMY;  Surgeon: Robert Bellow, MD;  Location: [facility];  Service: General;  Laterality: N/A;  . JOINT REPLACEMENT     BILATERAL KNEE REPLACEMENTS  . KNEE ARTHROSCOPY W/  [place]ATS PROCEDURE     Lt knee (9/01), Rt knee (3/11), Lt hip (5/10)  . LEFT HEART CATH AND CORONARY ANGIOGRAPHY N/A 03/31/2018   Procedure: LEFT HEART CATH AND CORONARY ANGIOGRAPHY;  Surgeon: Wellington Hampshire, MD;  Location: [facility];  Service: Cardiovascular;  Laterality: N/A;  . TOTAL HIP ARTHROPLASTY      Current Outpatient Medications  Medication Sig Dispense Refill  . acetaminophen (TYLENOL) 500 MG tablet Take 1,000 mg by mouth 2 (two) times daily.     [name] ADVAIR DISKUS 250-50 MCG/DOSE AEPB INHALE 1 PUFF BY MOUTH TWICE DAILY (Patient taking differently: INHALE 1 PUFF BY MOUTH TWICE DAILY as needed when she gets allergies or bad colds) 14 each 0  . amitriptyline (ELAVIL) 25 MG tablet Take 25 mg by mouth at bedtime 60 tablet 5  . apixaban (ELIQUIS) 5 MG TABS tablet Take 1 tablet (5 mg total) by mouth 2 (two) times daily. 60 tablet 11  . ATROVENT HFA 17 MCG/ACT inhaler inhale 2 puffs INTO THE LUNGS every 6 hours if needed for wheezing 12.9 g 2  . b complex vitamins tablet Take 1 tablet by mouth daily.    . Cholecalciferol (VITAMIN D3) 2000 UNITS TABS Take 2,000 Units by mouth 2 (two) times a week.     . clopidogrel (PLAVIX) 75 MG tablet Take 1 tablet (75 mg total) by mouth daily with breakfast. 90 tablet 3  .  Coenzyme Q10 (COQ-10) 200 MG CAPS Take 200 mg by mouth daily.     [name] conjugated estrogens (PREMARIN) vaginal cream Apply 0.5mg  (pea-sized amount)  just inside the vaginal introitus with a finger-tip on  Monday, Wednesday and Friday nights. 30 g 12  . Evolocumab (REPATHA SURECLICK) 267 MG/ML SOAJ Inject 1 pen into the skin every 14 (fourteen) days. 2 pen 11  . ezetimibe (ZETIA) 10 MG tablet Take 1 tablet (10 mg total) by mouth daily. 90 tablet 3  . ferrous sulfate 325 (65 FE) MG tablet Take 1 tablet (325 mg total) by mouth 2 (two) times daily with a meal. 60 tablet 3  . folic acid (FOLVITE) 1 MG tablet TAKE 1 TABLET(1 MG) BY MOUTH DAILY 30 tablet 0  . furosemide (LASIX) 40 MG  tablet Take 1 tablet (40 mg total) by mouth daily. 90 tablet 3  . hydrocortisone 2.5 % cream Apply to VULVA at bedtime as needed with miconazole for lichen planus 28 g 0  . ketoconazole (NIZORAL) 2 % cream Apply 1 application topically daily as needed (for crack bleeding fingers.).    [name] loratadine (CLARITIN) 10 MG tablet Take 10 mg by mouth daily.    . meclizine (ANTIVERT) 25 MG tablet Take 25 mg by mouth 3 (three) times daily as needed for dizziness.    . miconazole (MICOTIN) 2 % cream Apply 1 application topically at bedtime as needed (with hydrocortisone for lichen planus).     . miconazole (MICOTIN) 2 % powder Apply topically as needed for itching. 70 g 0  . Multiple Vitamins-Minerals (HAIR/SKIN/NAILS PO) Take 1 tablet by mouth daily.    . nitroGLYCERIN (NITROSTAT) 0.4 MG SL tablet Place 1 tablet (0.4 mg total) under the tongue every 5 (five) minutes as needed for chest pain. 25 tablet 6  . pantoprazole (PROTONIX) 40 MG tablet Take 1 tablet (40 mg total) by mouth 2 (two) times daily. 180 tablet 1  . polyethylene glycol powder (GLYCOLAX/MIRALAX) powder Take 8.5 g by mouth daily as needed.     . ramipril (ALTACE) 5 MG capsule Take 1 capsule (5 mg total) by mouth daily. 90 capsule 3  . spironolactone (ALDACTONE) 25 MG tablet Take 1 tablet (25 mg total) by mouth every morning. 90 tablet 3  . traMADol (ULTRAM) 50 MG tablet Take 1 tablet (50 mg total) by mouth 2 (two) times daily as needed. 30 tablet 0  . traZODone (DESYREL) 150 MG tablet TAKE 1 AND 2/3 TO 2 TABLETS BY MOUTH AT BEDTIME AS DIRECTED (Patient taking differently: Take 250 mg by mouth at bedtime (1 and 2/3 tablets)) 180 tablet 2  . verapamil (CALAN-SR) 180 MG CR tablet Take 1 tablet (180 mg total) by mouth 2 (two) times daily. 180 tablet 4  . vitamin B-12 (CYANOCOBALAMIN) 1000 MCG tablet Take 1,000 mcg by mouth daily.    . vitamin C (ASCORBIC ACID) 500 MG tablet Take 500 mg by mouth at bedtime.     No current facility-administered  medications for this visit.      Allergies:   Beta adrenergic blockers; Brilinta [ticagrelor]; Albuterol; Antihistamines, diphenhydramine-type; Aspirin; Nsaids; Penicillins; Statins; Sulfa antibiotics; Sulfasalazine; Tetracycline; and Tetracyclines & related   Social History:  The patient  reports that she has quit smoking. She has never used smokeless tobacco. She reports that she does not drink alcohol or use drugs.   Family History:   family history includes Alcohol abuse in her father; Depression in her daughter, father, maternal grandmother, and son; Heart  disease in her father; Hypertension in her maternal grandfather and paternal grandfather; Lung cancer in her sister; Seizures in her son; Stroke in her maternal grandmother; Thyroid disease in her daughter and mother.    Review of Systems: Review of Systems  Constitutional: Negative.   Respiratory: Negative.   Cardiovascular: Negative.   Gastrointestinal: Negative.   Musculoskeletal: Negative.   Neurological: Negative.   Psychiatric/Behavioral: Negative.   All other systems reviewed and are negative.    PHYSICAL EXAM: VS:  BP 140/80 (BP Location: Left Arm, Patient Position: Sitting, Cuff Size: Normal)   Pulse 66   Ht 5\' 3"  (1.6 m)   Wt 179 lb (81.2 kg)   BMI 31.71 kg/m  , BMI Body mass index is 31.71 kg/m. Constitutional:  oriented to person, place, and time. No distress.  HENT:  Head: Grossly normal Eyes:  no discharge. No scleral icterus.  Neck: No JVD, no carotid bruits  Cardiovascular: Regular rate and rhythm, no murmurs appreciated Trace lower extremity edema left lower extremity, compression hose in place Pulmonary/Chest: Clear to auscultation bilaterally, no wheezes or rails Abdominal: Soft.  no distension.  no tenderness.  Musculoskeletal: Normal range of motion Neurological:  normal muscle tone. Coordination normal. No atrophy Skin: Skin warm and dry Psychiatric: normal affect, pleasant   Recent  Labs: 03/25/2018: B Natriuretic Peptide 1,912.0 03/30/2018: Magnesium 2.1; TSH 2.962 07/15/2018: ALT 19; BUN 22; Creatinine, Ser 1.00; Hemoglobin 13.2; Platelets 225.0; Potassium 4.4; Sodium 135    Lipid Panel Lab Results  Component Value Date   CHOL 128 07/15/2018   HDL 60.10 07/15/2018   LDLCALC 48 07/15/2018   TRIG 104.0 07/15/2018      Wt Readings from Last 3 Encounters:  08/26/18 179 lb (81.2 kg)  07/15/18 173 lb 6 oz (78.6 kg)  06/24/18 174 lb 8 oz (79.2 kg)       ASSESSMENT AND PLAN:  Persistent atrial fibrillation (HCC) - Plan: EKG 12-Lead Rate well controlled on verapamil She does not want beta-blockers previous side effects Tolerating anticoagulation on Eliquis  Coronary artery disease of native artery of native heart with stable angina pectoris (HCC) unable to tolerate aspirin. Tolerating Plavix  stent to vein graft to the diagonal vessel, feels better  Recommend she continue Eliquis and Plavix  Essential hypertension, benign Blood pressure is well controlled on today's visit. No changes made to the medications. stable  Hypercholesterolemia Unable to tolerate statins,evere myalgias. Tried 4 different statins She is tolerating Zetia We have placed a pharmacy request for consideration of repatha or praluent Goal LDL less than 70 only 384  Chronic diastolic heart failure (HCC) Weight is stable, monitoring weight daily Nonpitting leg swelling left leg greater than right, exacerbated by a venous insufficiency We'll continue Lasix 40 daily  TGA (transient global amnesia) No further episodes,  Carotid stenosis Moderate in severity Aggressive lipid management, numbers currently at goal  Disposition:   F/U  12 months   Total encounter time more than 25 minutes  Greater than 50% was spent in counseling and coordination of care with the patient    Orders Placed This Encounter  Procedures  . EKG 12-Lead     Signed, Esmond Plants, M.D.,  Ph.D. 08/26/2018  [place], [place]

## 2018-08-26 ENCOUNTER — Ambulatory Visit (INDEPENDENT_AMBULATORY_CARE_PROVIDER_SITE_OTHER): Payer: Medicare Other | Admitting: Cardiovascular Disease

## 2018-08-26 ENCOUNTER — Encounter: Payer: Self-pay | Admitting: Cardiovascular Disease

## 2018-08-26 VITALS — BP 140/80 | HR 66 | Ht 63.0 in | Wt 179.0 lb

## 2018-08-26 DIAGNOSIS — I5032 Chronic diastolic (congestive) heart failure: Secondary | ICD-10-CM

## 2018-08-26 DIAGNOSIS — I10 Essential (primary) hypertension: Secondary | ICD-10-CM

## 2018-08-26 DIAGNOSIS — I25118 Atherosclerotic heart disease of native coronary artery with other forms of angina pectoris: Secondary | ICD-10-CM

## 2018-08-26 DIAGNOSIS — E7800 Pure hypercholesterolemia, unspecified: Secondary | ICD-10-CM | POA: Diagnosis not present

## 2018-08-26 DIAGNOSIS — I4819 Other persistent atrial fibrillation: Secondary | ICD-10-CM

## 2018-08-26 DIAGNOSIS — I119 Hypertensive heart disease without heart failure: Secondary | ICD-10-CM | POA: Diagnosis not present

## 2018-08-26 NOTE — Patient Instructions (Signed)

## 2018-09-09 ENCOUNTER — Other Ambulatory Visit: Payer: Self-pay | Admitting: Internal Medicine

## 2018-09-12 ENCOUNTER — Other Ambulatory Visit: Payer: Self-pay | Admitting: Internal Medicine

## 2018-09-12 MED ORDER — PANTOPRAZOLE SODIUM 40 MG PO TBEC: 40.0000 mg | DELAYED_RELEASE_TABLET | Freq: Two times a day (BID) | ORAL | 2 refills | Status: DC

## 2018-09-12 NOTE — Telephone Encounter (Signed)
Copied from [place] [id number]. Topic: Quick Communication - Rx Refill/Question >> Sep 12, 2018  6:22 PM Blase Mess A wrote: Medication: pantoprazole (PROTONIX) 40 MG tablet [[id number]]  Has the patient contacted their pharmacy? Yes  (Agent: If no, request that the patient contact the pharmacy for the refill.) (Agent: If yes, when and what did the pharmacy advise?)  Preferred Pharmacy (with phone number or street name): [id number] - [place], [place] [address] [address] [place] [address] Phone: [phone] Fax: [phone]    Agent: Please be advised that RX refills may take up to 3 business days. We ask that you follow-up with your pharmacy.

## 2018-09-19 ENCOUNTER — Other Ambulatory Visit: Payer: Self-pay | Admitting: Internal Medicine

## 2018-11-11 ENCOUNTER — Ambulatory Visit (INDEPENDENT_AMBULATORY_CARE_PROVIDER_SITE_OTHER): Payer: Medicare Other | Admitting: Internal Medicine

## 2018-11-11 ENCOUNTER — Encounter: Payer: Self-pay | Admitting: Internal Medicine

## 2018-11-11 VITALS — BP 110/62 | HR 102 | Temp 97.4°F | Resp 16 | Ht 63.0 in | Wt 180.8 lb

## 2018-11-11 DIAGNOSIS — Z0000 Encounter for general adult medical examination without abnormal findings: Secondary | ICD-10-CM

## 2018-11-11 DIAGNOSIS — I10 Essential (primary) hypertension: Secondary | ICD-10-CM | POA: Diagnosis not present

## 2018-11-11 DIAGNOSIS — I5032 Chronic diastolic (congestive) heart failure: Secondary | ICD-10-CM

## 2018-11-11 DIAGNOSIS — E559 Vitamin D deficiency, unspecified: Secondary | ICD-10-CM

## 2018-11-11 DIAGNOSIS — J4520 Mild intermittent asthma, uncomplicated: Secondary | ICD-10-CM

## 2018-11-11 DIAGNOSIS — R739 Hyperglycemia, unspecified: Secondary | ICD-10-CM | POA: Diagnosis not present

## 2018-11-11 DIAGNOSIS — K2100 Gastro-esophageal reflux disease with esophagitis, without bleeding: Secondary | ICD-10-CM

## 2018-11-11 DIAGNOSIS — E7800 Pure hypercholesterolemia, unspecified: Secondary | ICD-10-CM | POA: Diagnosis not present

## 2018-11-11 DIAGNOSIS — M4800 Spinal stenosis, site unspecified: Secondary | ICD-10-CM

## 2018-11-11 DIAGNOSIS — F329 Major depressive disorder, single episode, unspecified: Secondary | ICD-10-CM | POA: Diagnosis not present

## 2018-11-11 DIAGNOSIS — I25118 Atherosclerotic heart disease of native coronary artery with other forms of angina pectoris: Secondary | ICD-10-CM

## 2018-11-11 DIAGNOSIS — D649 Anemia, unspecified: Secondary | ICD-10-CM

## 2018-11-11 DIAGNOSIS — F32A Depression, unspecified: Secondary | ICD-10-CM

## 2018-11-11 MED ORDER — MUPIROCIN 2 % EX OINT: TOPICAL_OINTMENT | CUTANEOUS | 0 refills | Status: DC

## 2018-11-11 MED ORDER — KETOCONAZOLE 2 % EX CREA: 1.0000 "application " | TOPICAL_CREAM | Freq: Every day | CUTANEOUS | Status: DC | PRN

## 2018-11-11 NOTE — Progress Notes (Signed)
Patient ID: Charlotte Sessions, MD, female   DOB: October 03, 1936, 83 y.o.   MRN: [id number]   Subjective:    Patient ID: Charlotte Sessions, MD, female    DOB: 01/11/1936, 83 y.o.   MRN: [id number]  HPI  Patient with past history of CAD, hypertension, afib and depression.  She comes in today to follow up on these issues as well as for a complete physical exam.  She reports things are relatively stable.  Saw Dr Rockey Situ 08/2018.  Stable. States has f/up planned 02/2019.  No chest pain.  Breathing stable.  No acid reflux.  No abdominal pain.  Bowels moving.  Persistent back pain.  Stable.  No urinary symptoms.  Using premarin cream and taking cranberry tablets.  Trying to stay active.     Past Medical History:  Diagnosis Date  . Allergy   . Arthritis    s/p bilateral knees and left hip replacement  . Asthma   . CAD (coronary artery disease)    a. 12/1996 s/p CABG x4 ([place]);  b. 2005 Pt reports stress test & cath, which revealed patent grafts.  . Cancer ([place])    melanoma right arm  . Carotid arterial disease ([place])    a. 04/2015 Carotid U/S: <50% bilat ICA stenosis.  . Chronic kidney disease   . Colon polyps    H/O  . Depression   . GERD (gastroesophageal reflux disease)    h/o hiatal hernia  . Headache    migraines in past  . Heart murmur    a. 04/2011 Echo: EF 55-60%, bilat atrial enlargement, mild to mod TR.  [name] History of chicken pox   . History of hiatal hernia   . Hx of migraines    rare now  . Hx: UTI (urinary tract infection)   . Hyperlipidemia    a. Statin intolerant -->on zetia.  . Hypertension   . Hypertensive heart disease   . Lichen planus   . Melanoma ([place])   . Palpitations    a. rare PVC's and h/o SVT.  [name] PMR (polymyalgia rheumatica) (HCC)    h/o in setting of crestor usage.  [name] PSVT (paroxysmal supraventricular tachycardia) ([place])   . PUD (peptic ulcer disease)    remote history  . Raynaud's phenomenon   . Spinal stenosis   . Urine incontinence    H/O  . Vitamin D  deficiency    Past Surgical History:  Procedure Laterality Date  . ADENOIDECTOMY     age 23  . BACK SURGERY     L3-L5  . BILATERAL CARPAL TUNNEL RELEASE    . BREAST BIOPSY Right    bx x 3 neg  . BREAST SURGERY Right    biopsy x 3 (all benign)  . CARDIAC CATHETERIZATION N/A 06/01/2016   Procedure: LEFT HEART CATH AND CORS/GRAFTS ANGIOGRAPHY;  Surgeon: Wellington Hampshire, MD;  Location: [place] [facility];  Service: Cardiovascular;  Laterality: N/A;  . CARDIAC CATHETERIZATION N/A 06/01/2016   Procedure: Coronary Stent Intervention;  Surgeon: Wellington Hampshire, MD;  Location: [place] [facility];  Service: Cardiovascular;  Laterality: N/A;  . CARDIOVERSION N/A 03/08/2017   Procedure: CARDIOVERSION;  Surgeon: Wellington Hampshire, MD;  Location: [facility];  Service: Cardiovascular;  Laterality: N/A;  . CATARACT EXTRACTION W/PHACO Right 01/04/2016   Procedure: CATARACT EXTRACTION PHACO AND INTRAOCULAR LENS PLACEMENT ([employer]);  Surgeon: Leandrew Koyanagi, MD;  Location: [place];  Service: Ophthalmology;  Laterality: Right;  MALYUGIN  . CATARACT EXTRACTION  W/PHACO Left 01/25/2016   Procedure: CATARACT EXTRACTION PHACO AND INTRAOCULAR LENS PLACEMENT (IOC) left eye;  Surgeon: Leandrew Koyanagi, MD;  Location: [place];  Service: Ophthalmology;  Laterality: Left;  MALYUGIN SHUGARCAINE  . CHOLECYSTECTOMY  90's  . COLONOSCOPY WITH PROPOFOL N/A 05/03/2015   Procedure: COLONOSCOPY WITH PROPOFOL;  Surgeon: Lollie Sails, MD;  Location[facility];  Service: Endoscopy;  Laterality: N/A;  . CORONARY ARTERY BYPASS GRAFT  98   4 vessel  . CORONARY STENT INTERVENTION N/A 03/31/2018   Procedure: CORONARY STENT INTERVENTION;  Surgeon: Wellington Hampshire, MD;  Location: [place] [facility];  Service: Cardiovascular;  Laterality: N/A;  . ESOPHAGOGASTRODUODENOSCOPY N/A 03/22/2015   Procedure: ESOPHAGOGASTRODUODENOSCOPY (EGD);  Surgeon: Lollie Sails, MD;  Location: [facility] ENDOSCOPY;   Service: Endoscopy;  Laterality: N/A;  . HEMORRHOID BANDING    . HEMORRHOID SURGERY N/A 02/17/2018   Procedure: HEMORRHOIDECTOMY;  Surgeon: Robert Bellow, MD;  Location: [facility];  Service: General;  Laterality: N/A;  . JOINT REPLACEMENT     BILATERAL KNEE REPLACEMENTS  . KNEE ARTHROSCOPY W/ OATS PROCEDURE     Lt knee (9/01), Rt knee (3/11), Lt hip (5/10)  . LEFT HEART CATH AND CORONARY ANGIOGRAPHY N/A 03/31/2018   Procedure: LEFT HEART CATH AND CORONARY ANGIOGRAPHY;  Surgeon: Wellington Hampshire, MD;  Location: [place] [facility];  Service: Cardiovascular;  Laterality: N/A;  . TOTAL HIP ARTHROPLASTY     Family History  Problem Relation Age of Onset  . Thyroid disease Mother        graves disease  . Heart disease Father        rheumatic heart  . Alcohol abuse Father   . Depression Father   . Lung cancer Sister   . Depression Daughter   . Thyroid disease Daughter        hashimoto  . Depression Son   . Stroke Maternal Grandmother   . Depression Maternal Grandmother   . Hypertension Maternal Grandfather   . Hypertension Paternal Grandfather   . Seizures Son   . Breast cancer Neg Hx    Social History   Socioeconomic History  . Marital status: Married    Spouse name: Not on file  . Number of children: 3  . Years of education: Not on file  . Highest education level: Not on file  Occupational History  . Occupation: Retired Sport and exercise psychologist  Social Needs  . Financial resource strain: Not on file  . Food insecurity:    Worry: Not on file    Inability: Not on file  . Transportation needs:    Medical: Not on file    Non-medical: Not on file  Tobacco Use  . Smoking status: Former Research scientist (life sciences)  . Smokeless tobacco: Never Used  . Tobacco comment: quit 47+ yrs ago  Substance and Sexual Activity  . Alcohol use: No    Alcohol/week: 0.0 standard drinks  . Drug use: No  . Sexual activity: Never  Lifestyle  . Physical activity:    Days per week: 5 days    Minutes per session:  30 min  . Stress: Not at all  Relationships  . Social connections:    Talks on phone: Not on file    Gets together: Not on file    Attends religious service: Not on file    Active member of club or organization: Not on file    Attends meetings of clubs or organizations: Not on file    Relationship status: Not  on file  Other Topics Concern  . Not on file  Social History Narrative   Lives in [place].  Retired Engineer, drilling.  Relatively active.   Uses a Rollator to ambulate    Outpatient Encounter Medications as of 11/11/2018  Medication Sig  . acetaminophen (TYLENOL) 500 MG tablet Take 1,000 mg by mouth 2 (two) times daily.   [name] ADVAIR DISKUS 250-50 MCG/DOSE AEPB INHALE 1 PUFF BY MOUTH TWICE DAILY (Patient taking differently: INHALE 1 PUFF BY MOUTH TWICE DAILY as needed when she gets allergies or bad colds)  . amitriptyline (ELAVIL) 25 MG tablet Take 25 mg by mouth at bedtime  . apixaban (ELIQUIS) 5 MG TABS tablet Take 1 tablet (5 mg total) by mouth 2 (two) times daily.  . ATROVENT HFA 17 MCG/ACT inhaler inhale 2 puffs INTO THE LUNGS every 6 hours if needed for wheezing  . b complex vitamins tablet Take 1 tablet by mouth daily.  . Cholecalciferol (VITAMIN D3) 2000 UNITS TABS Take 2,000 Units by mouth 2 (two) times a week.   . clopidogrel (PLAVIX) 75 MG tablet Take 1 tablet (75 mg total) by mouth daily with breakfast.  . Coenzyme Q10 (COQ-10) 200 MG CAPS Take 200 mg by mouth daily.   [name] conjugated estrogens (PREMARIN) vaginal cream Apply 0.5mg  (pea-sized amount)  just inside the vaginal introitus with a finger-tip on  Monday, Wednesday and Friday nights.  . Evolocumab (REPATHA SURECLICK) 737 MG/ML SOAJ Inject 1 pen into the skin every 14 (fourteen) days.  [name] ezetimibe (ZETIA) 10 MG tablet Take 1 tablet (10 mg total) by mouth daily.  . ferrous sulfate 325 (65 FE) MG tablet Take 1 tablet (325 mg total) by mouth 2 (two) times daily with a meal.  . folic acid (FOLVITE) 1 MG tablet TAKE 1 TABLET(1  MG) BY MOUTH DAILY  . furosemide (LASIX) 40 MG tablet Take 1 tablet (40 mg total) by mouth daily.  . hydrocortisone 2.5 % cream Apply to VULVA at bedtime as needed with miconazole for lichen planus  . ketoconazole (NIZORAL) 2 % cream Apply 1 application topically daily as needed (for crack bleeding fingers.).  [name] loratadine (CLARITIN) 10 MG tablet Take 10 mg by mouth daily.  . meclizine (ANTIVERT) 25 MG tablet Take 25 mg by mouth 3 (three) times daily as needed for dizziness.  . miconazole (MICOTIN) 2 % cream Apply 1 application topically at bedtime as needed (with hydrocortisone for lichen planus).   . miconazole (MICOTIN) 2 % powder Apply topically as needed for itching.  . Multiple Vitamins-Minerals (HAIR/SKIN/NAILS PO) Take 1 tablet by mouth daily.  . nitroGLYCERIN (NITROSTAT) 0.4 MG SL tablet Place 1 tablet (0.4 mg total) under the tongue every 5 (five) minutes as needed for chest pain.  . pantoprazole (PROTONIX) 40 MG tablet Take 1 tablet (40 mg total) by mouth 2 (two) times daily.  . polyethylene glycol powder (GLYCOLAX/MIRALAX) powder Take 8.5 g by mouth daily as needed.   . ramipril (ALTACE) 5 MG capsule Take 1 capsule (5 mg total) by mouth daily.  [name] spironolactone (ALDACTONE) 25 MG tablet Take 1 tablet (25 mg total) by mouth every morning.  . traMADol (ULTRAM) 50 MG tablet Take 1 tablet (50 mg total) by mouth 2 (two) times daily as needed.  . traZODone (DESYREL) 150 MG tablet TAKE 1 AND 2/3 TO 2 TABLETS BY MOUTH AT BEDTIME AS DIRECTED (Patient taking differently: Take 250 mg by mouth at bedtime (1 and 2/3 tablets))  . traZODone (DESYREL) 150  MG tablet TAKE 1 AND 2/3 TO 2 TABLETS BY MOUTH AT BEDTIME AS DIRECTED  . verapamil (CALAN-SR) 180 MG CR tablet Take 1 tablet (180 mg total) by mouth 2 (two) times daily.  . vitamin B-12 (CYANOCOBALAMIN) 1000 MCG tablet Take 1,000 mcg by mouth daily.  . vitamin C (ASCORBIC ACID) 500 MG tablet Take 500 mg by mouth at bedtime.  . [DISCONTINUED]  ketoconazole (NIZORAL) 2 % cream Apply 1 application topically daily as needed (for crack bleeding fingers.).  [name] mupirocin ointment (BACTROBAN) 2 % Apply to affected area bid   No facility-administered encounter medications on file as of 11/11/2018.     Review of Systems  Constitutional: Negative for appetite change and unexpected weight change.  HENT: Negative for congestion and sinus pressure.   Eyes: Negative for pain and visual disturbance.  Respiratory: Negative for cough and chest tightness.        Breathing stable.   Cardiovascular: Negative for chest pain, palpitations and leg swelling.  Gastrointestinal: Negative for abdominal pain, diarrhea, nausea and vomiting.  Genitourinary: Negative for difficulty urinating and dysuria.  Musculoskeletal: Positive for back pain. Negative for joint swelling.  Skin: Negative for color change and rash.  Neurological: Negative for dizziness, light-headedness and headaches.  Hematological: Negative for adenopathy. Does not bruise/bleed easily.  Psychiatric/Behavioral: Negative for agitation and dysphoric mood.       Objective:    Physical Exam Constitutional:      General: She is not in acute distress.    Appearance: Normal appearance. She is well-developed.  HENT:     Nose: Nose normal. No congestion.     Mouth/Throat:     Pharynx: No oropharyngeal exudate or posterior oropharyngeal erythema.  Eyes:     General: No scleral icterus.       Right eye: No discharge.        Left eye: No discharge.  Neck:     Musculoskeletal: Neck supple. No muscular tenderness.     Thyroid: No thyromegaly.  Cardiovascular:     Rate and Rhythm: Normal rate.  Pulmonary:     Effort: No tachypnea, accessory muscle usage or respiratory distress.     Breath sounds: Normal breath sounds. No decreased breath sounds or wheezing.  Chest:     Breasts:        Right: No inverted nipple, mass, nipple discharge or tenderness (no axillary adenopathy).        Left:  No inverted nipple, mass, nipple discharge or tenderness (no axilarry adenopathy).  Abdominal:     General: Bowel sounds are normal.     Palpations: Abdomen is soft.     Tenderness: There is no abdominal tenderness.  Musculoskeletal:        General: No swelling or tenderness.  Lymphadenopathy:     Cervical: No cervical adenopathy.  Skin:    Findings: No erythema or rash.  Neurological:     Mental Status: She is alert and oriented to person, place, and time.  Psychiatric:        Mood and Affect: Mood normal.        Behavior: Behavior normal.     BP 110/62 (BP Location: Left Arm, Patient Position: Sitting, Cuff Size: Normal)   Pulse (!) 102   Temp (!) 97.4 F (36.3 C) (Oral)   Resp 16   Ht 5\' 3"  (1.6 m)   Wt 180 lb 12.8 oz (82 kg)   SpO2 97%   BMI 32.03 kg/m  Wt Readings from Last 3  Encounters:  11/11/18 180 lb 12.8 oz (82 kg)  08/26/18 179 lb (81.2 kg)  07/15/18 173 lb 6 oz (78.6 kg)     Lab Results  Component Value Date   WBC 3.7 (L) 07/15/2018   HGB 13.2 07/15/2018   HCT 39.2 07/15/2018   PLT 225.0 07/15/2018   GLUCOSE 103 (H) 07/15/2018   CHOL 128 07/15/2018   TRIG 104.0 07/15/2018   HDL 60.10 07/15/2018   LDLDIRECT 126.7 07/03/2013   LDLCALC 48 07/15/2018   ALT 19 07/15/2018   AST 21 07/15/2018   NA 135 07/15/2018   K 4.4 07/15/2018   CL 100 07/15/2018   CREATININE 1.00 07/15/2018   BUN 22 07/15/2018   CO2 27 07/15/2018   TSH 2.962 03/30/2018   INR 1.34 03/28/2018   HGBA1C 4.8 03/28/2018    US Renal  Result Date: 07/02/2018 CLINICAL DATA:  Recurring urinary tract infection EXAM: RENAL / URINARY TRACT ULTRASOUND COMPLETE COMPARISON:  None. FINDINGS: Right Kidney: Length: 10.5 cm. Echogenicity within normal limits. No mass or hydronephrosis visualized. Left Kidney: Length: 9.6 cm. Echogenicity within normal limits. No mass or hydronephrosis visualized. Bladder: Appears normal for degree of bladder distention. Left ureteral jet is not seen. IMPRESSION:  Normal renal ultrasound. Electronically Signed   By: Abelardo Diesel M.D.   On: 07/02/2018 08:35       Assessment & Plan:   Problem List Items Addressed This Visit    Anemia    Follow cbc.        Relevant Orders   CBC with Differential/Platelet   Asthma    Breathing stable.       CAD (coronary artery disease)    S/p DES placement.  Continues on plavix and eliquis.  Followed by cardiology.  Stable.        Chronic diastolic heart failure (HCC)    No evidence of volume overload on exam.  Followed by cardiology. Stable.       Depression    Doing well on current regimen.  Follow.        Essential hypertension, benign    Blood pressure under good control.  Continue same medication regimen.  Follow pressures.  Follow metabolic panel.        Relevant Orders   Basic metabolic panel   GERD (gastroesophageal reflux disease)    Controlled on current regimen.        Health care maintenance    Physical today 11/11/18.  Colonoscopy 2016      Hypercholesterolemia    On zetia.  Low cholesterol diet and exercise.  Follow lipid panel.       Relevant Orders   Hepatic function panel   Lipid panel   Spinal stenosis    Chronic back and leg pain.  Overall feels is stable.  Follow.        Vitamin D deficiency    Follow vitamin D level.         Other Visit Diagnoses    Routine general medical examination at a health care facility    -  Primary   Hyperglycemia       Relevant Orders   Hemoglobin A1c       [name], MD

## 2018-11-11 NOTE — Assessment & Plan Note (Signed)
Physical today 11/11/18.  Colonoscopy 2016

## 2018-11-16 ENCOUNTER — Encounter: Payer: Self-pay | Admitting: Internal Medicine

## 2018-11-16 NOTE — Assessment & Plan Note (Signed)
Chronic back and leg pain.  Overall feels is stable.  Follow.

## 2018-11-16 NOTE — Assessment & Plan Note (Signed)
Blood pressure under good control.  Continue same medication regimen.  Follow pressures.  Follow metabolic panel.   

## 2018-11-16 NOTE — Assessment & Plan Note (Signed)
S/p DES placement.  Continues on plavix and eliquis.  Followed by cardiology.  Stable.   

## 2018-11-16 NOTE — Assessment & Plan Note (Signed)
Follow vitamin D level.  

## 2018-11-16 NOTE — Assessment & Plan Note (Signed)
Doing well on current regimen.  Follow.   

## 2018-11-16 NOTE — Assessment & Plan Note (Signed)
No evidence of volume overload on exam.  Followed by cardiology. Stable.

## 2018-11-16 NOTE — Assessment & Plan Note (Signed)
On zetia.  Low cholesterol diet and exercise.  Follow lipid panel.  

## 2018-11-16 NOTE — Assessment & Plan Note (Signed)
Follow cbc.  

## 2018-11-16 NOTE — Assessment & Plan Note (Signed)
Controlled on current regimen.   

## 2018-11-16 NOTE — Assessment & Plan Note (Signed)
Breathing stable.

## 2018-11-17 ENCOUNTER — Other Ambulatory Visit: Payer: Self-pay

## 2018-11-17 ENCOUNTER — Telehealth: Payer: Self-pay | Admitting: Internal Medicine

## 2018-11-17 MED ORDER — KETOCONAZOLE 2 % EX CREA: 1.0000 "application " | TOPICAL_CREAM | Freq: Every day | CUTANEOUS | 0 refills | Status: DC | PRN

## 2018-11-17 NOTE — Telephone Encounter (Signed)
Left message for patient letting her know that I resent her prescription

## 2018-11-17 NOTE — Telephone Encounter (Signed)
Copied from [place] [id number]. Topic: Quick Communication - See Telephone Encounter >> Nov 17, 2018  1:14 PM Bea Graff, NT wrote: CRM for notification. See Telephone encounter for: 11/17/18. Pt states that [facility] did not receive the refill for ketoconazole (NIZORAL) 2 % cream and she would like to see if this can be resent.

## 2018-11-27 ENCOUNTER — Other Ambulatory Visit: Payer: Self-pay | Admitting: Internal Medicine

## 2018-11-27 DIAGNOSIS — Z1231 Encounter for screening mammogram for malignant neoplasm of breast: Secondary | ICD-10-CM

## 2018-11-28 DIAGNOSIS — R350 Frequency of micturition: Secondary | ICD-10-CM | POA: Diagnosis not present

## 2018-11-28 DIAGNOSIS — N390 Urinary tract infection, site not specified: Secondary | ICD-10-CM | POA: Diagnosis not present

## 2018-11-28 DIAGNOSIS — A499 Bacterial infection, unspecified: Secondary | ICD-10-CM | POA: Diagnosis not present

## 2018-12-02 ENCOUNTER — Other Ambulatory Visit (INDEPENDENT_AMBULATORY_CARE_PROVIDER_SITE_OTHER): Payer: Medicare Other

## 2018-12-02 DIAGNOSIS — R739 Hyperglycemia, unspecified: Secondary | ICD-10-CM

## 2018-12-02 DIAGNOSIS — I25118 Atherosclerotic heart disease of native coronary artery with other forms of angina pectoris: Secondary | ICD-10-CM

## 2018-12-02 DIAGNOSIS — I10 Essential (primary) hypertension: Secondary | ICD-10-CM

## 2018-12-02 DIAGNOSIS — D649 Anemia, unspecified: Secondary | ICD-10-CM

## 2018-12-02 DIAGNOSIS — E7800 Pure hypercholesterolemia, unspecified: Secondary | ICD-10-CM

## 2018-12-02 LAB — CBC WITH DIFFERENTIAL/PLATELET
BASOS ABS: 0 10*3/uL (ref 0.0–0.1)
Basophils Relative: 0.5 % (ref 0.0–3.0)
Eosinophils Absolute: 0.1 10*3/uL (ref 0.0–0.7)
Eosinophils Relative: 2.7 % (ref 0.0–5.0)
HCT: 41.2 % (ref 36.0–46.0)
HEMOGLOBIN: 13.9 g/dL (ref 12.0–15.0)
LYMPHS ABS: 0.6 10*3/uL — AB (ref 0.7–4.0)
Lymphocytes Relative: 20.6 % (ref 12.0–46.0)
MCHC: 33.7 g/dL (ref 30.0–36.0)
MCV: 100.8 fl — ABNORMAL HIGH (ref 78.0–100.0)
MONO ABS: 0.4 10*3/uL (ref 0.1–1.0)
Monocytes Relative: 14.2 % — ABNORMAL HIGH (ref 3.0–12.0)
NEUTROS PCT: 62 % (ref 43.0–77.0)
Neutro Abs: 1.9 10*3/uL (ref 1.4–7.7)
Platelets: 228 10*3/uL (ref 150.0–400.0)
RBC: 4.09 Mil/uL (ref 3.87–5.11)
RDW: 12.8 % (ref 11.5–15.5)
WBC: 3 10*3/uL — AB (ref 4.0–10.5)

## 2018-12-02 LAB — HEPATIC FUNCTION PANEL
ALK PHOS: 61 U/L (ref 39–117)
ALT: 22 U/L (ref 0–35)
AST: 26 U/L (ref 0–37)
Albumin: 4.5 g/dL (ref 3.5–5.2)
BILIRUBIN DIRECT: 0.2 mg/dL (ref 0.0–0.3)
Total Bilirubin: 0.8 mg/dL (ref 0.2–1.2)
Total Protein: 7 g/dL (ref 6.0–8.3)

## 2018-12-02 LAB — LIPID PANEL
CHOL/HDL RATIO: 2
Cholesterol: 142 mg/dL (ref 0–200)
HDL: 73.7 mg/dL (ref >39.00)
LDL CALC: 53 mg/dL (ref 0–99)
NonHDL: 68.04
TRIGLYCERIDES: 76 mg/dL (ref 0.0–149.0)
VLDL: 15.2 mg/dL (ref 0.0–40.0)

## 2018-12-02 LAB — BASIC METABOLIC PANEL
BUN: 19 mg/dL (ref 6–23)
CHLORIDE: 98 meq/L (ref 96–112)
CO2: 29 mEq/L (ref 19–32)
Calcium: 9.8 mg/dL (ref 8.4–10.5)
Creatinine, Ser: 0.96 mg/dL (ref 0.40–1.20)
GFR: 55.83 mL/min — ABNORMAL LOW (ref >60.00)
GLUCOSE: 103 mg/dL — AB (ref 70–99)
POTASSIUM: 4.2 meq/L (ref 3.5–5.1)
SODIUM: 134 meq/L — AB (ref 135–145)

## 2018-12-02 LAB — HEMOGLOBIN A1C: Hgb A1c MFr Bld: 5.6 % (ref 4.6–6.5)

## 2018-12-02 NOTE — Progress Notes (Unsigned)
Hepatic  

## 2018-12-02 NOTE — Addendum Note (Signed)
Addended by: Arby Barrette on: 12/02/2018 10:06 AM   Modules accepted: Orders

## 2018-12-18 ENCOUNTER — Other Ambulatory Visit: Payer: Self-pay

## 2018-12-18 ENCOUNTER — Ambulatory Visit
Admission: RE | Admit: 2018-12-18 | Discharge: 2018-12-18 | Disposition: A | Discharge status: Home / Self Care | Payer: Medicare Other | Source: Ambulatory Visit | Attending: Internal Medicine | Admitting: Internal Medicine

## 2018-12-18 DIAGNOSIS — Z1231 Encounter for screening mammogram for malignant neoplasm of breast: Secondary | ICD-10-CM | POA: Diagnosis not present

## 2019-01-05 ENCOUNTER — Other Ambulatory Visit: Payer: Self-pay | Admitting: Internal Medicine

## 2019-01-05 NOTE — Telephone Encounter (Signed)
rx ok'd for tramadol #30 with no refills.  

## 2019-01-06 NOTE — Telephone Encounter (Signed)
Patient is requesting tramadol. To be sent to [facility].  Nina,cma

## 2019-01-06 NOTE — Telephone Encounter (Signed)
Refilled tramadol yesterday.  Need to confirm pharmacy received if this is a new request.

## 2019-01-26 ENCOUNTER — Telehealth: Payer: Self-pay

## 2019-01-26 NOTE — Telephone Encounter (Signed)
Virtual Visit Pre-Appointment Phone Call  Steps For Call:  1. Confirm consent - "In the setting of the current Covid19 crisis, you are scheduled for a phone visit with your provider on Feb 24, 2019 at 2:20PM.  Just as we do with many in-office visits, in order for you to participate in this visit, we must obtain consent.  If you'd like, I can send this to your mychart (if signed up) or email for you to review.  Otherwise, I can obtain your verbal consent now.  All virtual visits are billed to your insurance company just like a normal visit would be.  By agreeing to a virtual visit, we'd like you to understand that the technology does not allow for your provider to perform an examination, and thus may limit your provider's ability to fully assess your condition. If your provider identifies any concerns that need to be evaluated in person, we will make arrangements to do so.  Finally, though the technology is pretty good, we cannot assure that it will always work on either your or our end, and in the setting of a video visit, we may have to convert it to a phone-only visit.  In either situation, we cannot ensure that we have a secure connection.  Are you willing to proceed?" STAFF: Did the patient verbally acknowledge consent to telehealth visit? Document YES/NO here: YES  2. Confirm the BEST phone number to call the day of the visit by including in appointment notes  3. Give patient instructions for WebEx/MyChart download to smartphone as below or [email or web address] if video visit (depending on what platform provider is using)  4. Advise patient to be prepared with their blood pressure, heart rate, weight, any heart rhythm information, their current medicines, and a piece of paper and pen handy for any instructions they may receive the day of their visit  5. Inform patient they will receive a phone call 15 minutes prior to their appointment time (may be from unknown caller ID) so they should be  prepared to answer  6. 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 I spoke with the patient via phone to ensure availability of phone/video source, confirm preferred email & phone number, and discuss instructions and expectations.  I reminded [name], MD to be prepared with any vital sign and/or heart rhythm information that could potentially be obtained via home monitoring, at the time of her visit. I reminded [name], MD to expect a phone call at the time of her visit if her visit.  Rene Paci McClain 01/26/2019 11:21 AM    FULL LENGTH CONSENT FOR TELE-HEALTH VISIT   I hereby voluntarily request, consent and authorize [facility] and its employed or contracted physicians, physician assistants, nurse practitioners or other licensed health care professionals (the Practitioner), to provide me with telemedicine health care services (the "Services") as deemed necessary by the treating Practitioner. I acknowledge and consent to receive the Services by the Practitioner via telemedicine. I understand that the telemedicine visit will involve communicating with the Practitioner through live audiovisual communication technology and the disclosure of certain medical information by electronic transmission. I acknowledge that I have been given the opportunity to request an in-person assessment or other available alternative prior to the telemedicine visit and  am voluntarily participating in the telemedicine visit.  I understand that I have the right to withhold or withdraw my consent to the use of telemedicine in the course of my care at any time, without affecting my right to future care or treatment, and that the Practitioner or I may terminate the telemedicine visit at any time. I  understand that I have the right to inspect all information obtained and/or recorded in the course of the telemedicine visit and may receive copies of available information for a reasonable fee.  I understand that some of the potential risks of receiving the Services via telemedicine include:  [name] Delay or interruption in medical evaluation due to technological equipment failure or disruption; . Information transmitted may not be sufficient (e.g. poor resolution of images) to allow for appropriate medical decision making by the Practitioner; and/or  . In rare instances, security protocols could fail, causing a breach of personal health information.  Furthermore, I acknowledge that it is my responsibility to provide information about my medical history, conditions and care that is complete and accurate to the best of my ability. I acknowledge that Practitioner's advice, recommendations, and/or decision may be based on factors not within their control, such as incomplete or inaccurate data provided by me or distortions of diagnostic images or specimens that may result from electronic transmissions. I understand that the practice of medicine is not an exact science and that Practitioner makes no warranties or guarantees regarding treatment outcomes. I acknowledge that I will receive a copy of this consent concurrently upon execution via email to the email address I last provided but may also request a printed copy by calling the office of [facility].    I understand that my insurance will be billed for this visit.   I have read or had this consent read to me. . I understand the contents of this consent, which adequately explains the benefits and risks of the Services being provided via telemedicine.  . I have been provided ample opportunity to ask questions regarding this consent and the Services and have had my questions answered to my satisfaction. . I give my informed consent for the services to be  provided through the use of telemedicine in my medical care  By participating in this telemedicine visit I agree to the above.

## 2019-01-29 ENCOUNTER — Other Ambulatory Visit: Payer: Self-pay | Admitting: Internal Medicine

## 2019-02-23 NOTE — Progress Notes (Signed)
Virtual Visit via Telephone Note   This visit type was conducted due to national recommendations for restrictions regarding the COVID-19 Pandemic (e.g. social distancing) in an effort to limit this patient's exposure and mitigate transmission in our community.  Due to her co-morbid illnesses, this patient is at least at moderate risk for complications without adequate follow up.  This format is felt to be most appropriate for this patient at this time.  The patient did not have access to video technology/had technical difficulties with video requiring transitioning to audio format only (telephone).  All issues noted in this document were discussed and addressed.  No physical exam could be performed with this format.  Please refer to the patient's chart for her  consent to telehealth for [facility].   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 I discussed the limitations of evaluation and management by telemedicine. The patient expressed understanding and agreed to proceed.   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   The patient does not symptoms concerning for COVID-19 infection (fever, chills, cough, or new SHORTNESS OF BREATH).   Patient has a past medical history of atrial fibrillation accompanied by bradycardia.  coronary artery disease  prior bypass surgery 1998 stent to  graft 8/17  Echo EF 1/18 with EF  60-65%   moderate to severe left atrial enlargement cardioversion that did not hold,  reverted to atrial fibrillation Did not feel well on b-blocker, better on ca channel blocker Statin intolerance , on Repatha Who presents for routine follow-up of her chronic stable angina, atrial fibrillation   Larynx paralyzed Walks daily, around the complex at [facility] Leg swollen in the PM, better by the AM This is been stable "poor stamina", but continues to do her regular activities Walks with walker on a regular basis No falls  Vitals discussed with her Pulse 68   125/74 resp 16 Sat 97%  Cath 03/31/2018, prior results discussed with her Significant underlying three-vessel coronary artery disease with patent grafts including LIMA to LAD, SVG to RCA, SVG to OM and SVG to diagonal.  Patent stent in the SVG to RCA.  Severe new stenosis and SVG to diagonal is likely the culprit.  STENT RESOLUTE ONYX 4.0X15. to SVG to diag   wears compression hose, stable Lasix 40 mg daily Weight 182 is stable   Other labs discussed with her Total chol 142, LDL 53 HBA1C 5.6  other past medical hx reviewed hospital admission for acute on chronic diastolic and systolic CHF Cath 0/96/2836 . Significant underlying three-vessel coronary artery disease with patent grafts including LIMA to LAD, SVG to RCA, SVG to OM and SVG to diagonal. Patent stent in the SVG to RCA. Severe new stenosis and SVG to diagonal is likely the culprit. 2. Left ventricular angiography was not performed. 3. High normal left ventricular end-diastolic pressure at  12 mmHg. 4. Successful direct stenting of SVG to diagonal using a distal protection device.  ECHO Prior to cardiac catheterization and stent placement ejection fraction was in the range of 30% to 35%. Hypokinesis of the anteroseptalmyocardium, apical,  anterior myocardium.  - Mitral valve: There was mild regurgitation. - Left atrium: The atrium was mildly to  moderately dilated. - Right ventricle: Systolic function was moderately to severely reduced. - Right atrium: The atrium was mildly dilated.  patent foramen ovale. - Tricuspid valve: moderate-severe regurgitation. - Pulmonary arteries: Systolic pressure was moderate to severely elevated PA peak pressure: 58 mm Hg (S).  long history of Statin intolerance, tried 4 different medications, had severe myalgias,  Lipitor,crestor, simvastatin all had severe side effects  on Zetia, tolerating this Cholesterol still above goal, LDL 112 on Zetia alone  History of bleeding from hemorrhoids. Stable recently  Episode of amnesia 03/12/2017 Workup including EEG No further episodes since that time  Carotid ultrasound reviewed with her in detail Moderate atherosclerotic plaque over the left carotid bulb    Prior CV studies:   The following studies were reviewed today:   Past Medical History:  Diagnosis Date  . Allergy   . Arthritis    s/p bilateral knees and left hip replacement  . Asthma   . CAD (coronary artery disease)    a. 12/1996 s/p CABG x4 ([place]);  b. 2005 Pt reports stress test & cath, which revealed patent grafts.  . Cancer ([place])    melanoma right arm  . Carotid arterial disease ([place])    a. 04/2015 Carotid U/S: <50% bilat ICA stenosis.  . Chronic kidney disease   . Colon polyps    H/O  . Depression   . GERD (gastroesophageal reflux disease)    h/o hiatal hernia  . Headache    migraines in past  . Heart murmur    a. 04/2011 Echo: EF 55-60%, bilat atrial enlargement, mild to mod TR.  [name] History of chicken pox   . History of hiatal hernia   . Hx of migraines    rare now  . Hx: UTI (urinary tract infection)   . Hyperlipidemia    a. Statin intolerant -->on zetia.  . Hypertension   . Hypertensive heart disease   . Lichen planus   . Melanoma ([place])   . Palpitations    a. rare PVC's and h/o SVT.  [name] PMR (polymyalgia rheumatica) (HCC)    h/o in setting of crestor  usage.  [name] PSVT (paroxysmal supraventricular tachycardia) ([place])   . PUD (peptic ulcer disease)    remote history  . Raynaud's phenomenon   . Spinal stenosis   . Urine incontinence    H/O  . Vitamin D deficiency    Past Surgical History:  Procedure Laterality Date  . ADENOIDECTOMY     age 27  . BACK SURGERY     L3-L5  . BILATERAL CARPAL TUNNEL RELEASE    . BREAST BIOPSY Right    bx x 3 neg  . BREAST SURGERY Right    biopsy x 3 (all benign)  . CARDIAC CATHETERIZATION N/A 06/01/2016   Procedure: LEFT HEART CATH AND CORS/GRAFTS ANGIOGRAPHY;  Surgeon: Wellington Hampshire, MD;  Location: [place] [facility];  Service: Cardiovascular;  Laterality: N/A;  . CARDIAC CATHETERIZATION N/A 06/01/2016   Procedure: Coronary Stent Intervention;  Surgeon: Wellington Hampshire, MD;  Location: [place] [facility];  Service: Cardiovascular;  Laterality: N/A;  . CARDIOVERSION N/A 03/08/2017   Procedure:  CARDIOVERSION;  Surgeon: Wellington Hampshire, MD;  Location: [facility] ORS;  Service: Cardiovascular;  Laterality: N/A;  . CATARACT EXTRACTION W/PHACO Right 01/04/2016   Procedure: CATARACT EXTRACTION PHACO AND INTRAOCULAR LENS PLACEMENT ([place]);  Surgeon: Leandrew Koyanagi, MD;  Location: [place];  Service: Ophthalmology;  Laterality: Right;  MALYUGIN  . CATARACT EXTRACTION W/PHACO Left 01/25/2016   Procedure: CATARACT EXTRACTION PHACO AND INTRAOCULAR LENS PLACEMENT ([place]) left eye;  Surgeon: Leandrew Koyanagi, MD;  Location: [place];  Service: Ophthalmology;  Laterality: Left;  MALYUGIN SHUGARCAINE  . CHOLECYSTECTOMY  90's  . COLONOSCOPY WITH PROPOFOL N/A 05/03/2015   Procedure: COLONOSCOPY WITH PROPOFOL;  Surgeon: Lollie Sails, MD;  [facility] END[facility];  Service: Endoscopy;  Laterality: N/A;  . CORONARY ARTERY BYPASS GRAFT  98   4 vessel  . CORONARY STENT INTERVENTION N/A 03/31/2018   Procedure: CORONARY STENT INTERVENTION;  Surgeon: Wellington Hampshire, MD;  Location: [place] CV  LAB;  Service: Cardiovascular;  Laterality: N/A;  . ESOPHAGOGASTRODUODENOSCOPY N/A 03/22/2015   Procedure: ESOPHAGOGASTRODUODENOSCOPY (EGD);  Surgeon: Lollie Sails, MD;  [facility];  Service: Endoscopy;  Laterality: N/A;  . HEMORRHOID BANDING    . HEMORRHOID SURGERY N/A 02/17/2018   Procedure: HEMORRHOIDECTOMY;  Surgeon: Robert Bellow, MD;  Location: [facility] ORS;  Service: General;  Laterality: N/A;  . JOINT REPLACEMENT     BILATERAL KNEE REPLACEMENTS  . KNEE ARTHROSCOPY W/ OATS PROCEDURE     Lt knee (9/01), Rt knee (3/11), Lt hip (5/10)  . LEFT HEART CATH AND CORONARY ANGIOGRAPHY N/A 03/31/2018   Procedure: LEFT HEART CATH AND CORONARY ANGIOGRAPHY;  Surgeon: Wellington Hampshire, MD;  Location: [place] [facility];  Service: Cardiovascular;  Laterality: N/A;  . TOTAL HIP ARTHROPLASTY       Current Meds  Medication Sig  . acetaminophen (TYLENOL) 500 MG tablet Take 1,000 mg by mouth 2 (two) times daily.   [name] ADVAIR DISKUS 250-50 MCG/DOSE AEPB INHALE 1 PUFF BY MOUTH TWICE DAILY (Patient taking differently: INHALE 1 PUFF BY MOUTH TWICE DAILY as needed when she gets allergies or bad colds)  . amitriptyline (ELAVIL) 25 MG tablet Take 25 mg by mouth at bedtime  . apixaban (ELIQUIS) 5 MG TABS tablet Take 1 tablet (5 mg total) by mouth 2 (two) times daily.  . ATROVENT HFA 17 MCG/ACT inhaler inhale 2 puffs INTO THE LUNGS every 6 hours if needed for wheezing  . b complex vitamins tablet Take 1 tablet by mouth daily.  . Cholecalciferol (VITAMIN D3) 2000 UNITS TABS Take 2,000 Units by mouth 2 (two) times a week.   . clopidogrel (PLAVIX) 75 MG tablet Take 1 tablet (75 mg total) by mouth daily with breakfast.  . Coenzyme Q10 (COQ-10) 200 MG CAPS Take 200 mg by mouth daily.   [name] conjugated estrogens (PREMARIN) vaginal cream Apply 0.5mg  (pea-sized amount)  just inside the vaginal introitus with a finger-tip on  Monday, Wednesday and Friday nights.  . Evolocumab (REPATHA SURECLICK) 127 MG/ML  SOAJ Inject 1 pen into the skin every 14 (fourteen) days.  [name] ezetimibe (ZETIA) 10 MG tablet Take 1 tablet (10 mg total) by mouth daily.  . ferrous sulfate 325 (65 FE) MG tablet Take 1 tablet (325 mg total) by mouth 2 (two) times daily with a meal.  . folic acid (FOLVITE) 1 MG tablet TAKE 1 TABLET(1 MG) BY MOUTH DAILY  . furosemide (LASIX) 40 MG tablet Take 1 tablet (40 mg total) by mouth daily.  . hydrocortisone  2.5 % cream Apply to VULVA at bedtime as needed with miconazole for lichen planus  . ketoconazole (NIZORAL) 2 % cream Apply 1 application topically daily as needed (for crack bleeding fingers.).  [name] loratadine (CLARITIN) 10 MG tablet Take 10 mg by mouth daily.  . meclizine (ANTIVERT) 25 MG tablet Take 25 mg by mouth 3 (three) times daily as needed for dizziness.  . miconazole (MICOTIN) 2 % cream Apply 1 application topically at bedtime as needed (with hydrocortisone for lichen planus).   . miconazole (MICOTIN) 2 % powder Apply topically as needed for itching.  . Multiple Vitamins-Minerals (HAIR/SKIN/NAILS PO) Take 1 tablet by mouth daily.  . mupirocin ointment (BACTROBAN) 2 % Apply to affected area bid  . nitroGLYCERIN (NITROSTAT) 0.4 MG SL tablet Place 1 tablet (0.4 mg total) under the tongue every 5 (five) minutes as needed for chest pain.  . pantoprazole (PROTONIX) 40 MG tablet Take 1 tablet (40 mg total) by mouth 2 (two) times daily.  . polyethylene glycol powder (GLYCOLAX/MIRALAX) powder Take 8.5 g by mouth daily as needed.   . ramipril (ALTACE) 5 MG capsule Take 1 capsule (5 mg total) by mouth daily.  [name] spironolactone (ALDACTONE) 25 MG tablet Take 1 tablet (25 mg total) by mouth every morning.  . traMADol (ULTRAM) 50 MG tablet TAKE 1 TABLET(50 MG) BY MOUTH TWICE DAILY AS NEEDED  . traZODone (DESYREL) 150 MG tablet TAKE 1 AND 2/3 TO 2 TABLETS BY MOUTH AT BEDTIME AS DIRECTED (Patient taking differently: Take 250 mg by mouth at bedtime (1 and 2/3 tablets))  . traZODone (DESYREL) 150 MG  tablet TAKE 1 AND 2/3 TO 2 TABLETS BY MOUTH AT BEDTIME AS DIRECTED  . verapamil (CALAN-SR) 180 MG CR tablet Take 1 tablet (180 mg total) by mouth 2 (two) times daily.  . vitamin B-12 (CYANOCOBALAMIN) 1000 MCG tablet Take 1,000 mcg by mouth daily.  . vitamin C (ASCORBIC ACID) 500 MG tablet Take 500 mg by mouth at bedtime.     Allergies:   Beta adrenergic blockers; Brilinta [ticagrelor]; Albuterol; Antihistamines, diphenhydramine-type; Aspirin; Nsaids; Penicillins; Statins; Sulfa antibiotics; Sulfasalazine; Tetracycline; and Tetracyclines & related   Social History   Tobacco Use  . Smoking status: Former Research scientist (life sciences)  . Smokeless tobacco: Never Used  . Tobacco comment: quit 47+ yrs ago  Substance Use Topics  . Alcohol use: No    Alcohol/week: 0.0 standard drinks  . Drug use: No     Current Outpatient Medications on File Prior to Visit  Medication Sig Dispense Refill  . acetaminophen (TYLENOL) 500 MG tablet Take 1,000 mg by mouth 2 (two) times daily.     [name] ADVAIR DISKUS 250-50 MCG/DOSE AEPB INHALE 1 PUFF BY MOUTH TWICE DAILY (Patient taking differently: INHALE 1 PUFF BY MOUTH TWICE DAILY as needed when she gets allergies or bad colds) 14 each 0  . amitriptyline (ELAVIL) 25 MG tablet Take 25 mg by mouth at bedtime 60 tablet 5  . apixaban (ELIQUIS) 5 MG TABS tablet Take 1 tablet (5 mg total) by mouth 2 (two) times daily. 60 tablet 11  . ATROVENT HFA 17 MCG/ACT inhaler inhale 2 puffs INTO THE LUNGS every 6 hours if needed for wheezing 12.9 g 2  . b complex vitamins tablet Take 1 tablet by mouth daily.    . Cholecalciferol (VITAMIN D3) 2000 UNITS TABS Take 2,000 Units by mouth 2 (two) times a week.     . clopidogrel (PLAVIX) 75 MG tablet Take 1 tablet (75 mg total) by  mouth daily with breakfast. 90 tablet 3  . Coenzyme Q10 (COQ-10) 200 MG CAPS Take 200 mg by mouth daily.     [name] conjugated estrogens (PREMARIN) vaginal cream Apply 0.5mg  (pea-sized amount)  just inside the vaginal introitus with a  finger-tip on  Monday, Wednesday and Friday nights. 30 g 12  . Evolocumab (REPATHA SURECLICK) 253 MG/ML SOAJ Inject 1 pen into the skin every 14 (fourteen) days. 2 pen 11  . ezetimibe (ZETIA) 10 MG tablet Take 1 tablet (10 mg total) by mouth daily. 90 tablet 3  . ferrous sulfate 325 (65 FE) MG tablet Take 1 tablet (325 mg total) by mouth 2 (two) times daily with a meal. 60 tablet 3  . folic acid (FOLVITE) 1 MG tablet TAKE 1 TABLET(1 MG) BY MOUTH DAILY 30 tablet 0  . furosemide (LASIX) 40 MG tablet Take 1 tablet (40 mg total) by mouth daily. 90 tablet 3  . hydrocortisone 2.5 % cream Apply to VULVA at bedtime as needed with miconazole for lichen planus 28 g 0  . ketoconazole (NIZORAL) 2 % cream Apply 1 application topically daily as needed (for crack bleeding fingers.). 15 g 0  . loratadine (CLARITIN) 10 MG tablet Take 10 mg by mouth daily.    . meclizine (ANTIVERT) 25 MG tablet Take 25 mg by mouth 3 (three) times daily as needed for dizziness.    . miconazole (MICOTIN) 2 % cream Apply 1 application topically at bedtime as needed (with hydrocortisone for lichen planus).     . miconazole (MICOTIN) 2 % powder Apply topically as needed for itching. 70 g 0  . Multiple Vitamins-Minerals (HAIR/SKIN/NAILS PO) Take 1 tablet by mouth daily.    . mupirocin ointment (BACTROBAN) 2 % Apply to affected area bid 22 g 0  . nitroGLYCERIN (NITROSTAT) 0.4 MG SL tablet Place 1 tablet (0.4 mg total) under the tongue every 5 (five) minutes as needed for chest pain. 25 tablet 6  . pantoprazole (PROTONIX) 40 MG tablet Take 1 tablet (40 mg total) by mouth 2 (two) times daily. 180 tablet 2  . polyethylene glycol powder (GLYCOLAX/MIRALAX) powder Take 8.5 g by mouth daily as needed.     . ramipril (ALTACE) 5 MG capsule Take 1 capsule (5 mg total) by mouth daily. 90 capsule 3  . spironolactone (ALDACTONE) 25 MG tablet Take 1 tablet (25 mg total) by mouth every morning. 90 tablet 3  . traMADol (ULTRAM) 50 MG tablet TAKE 1  TABLET(50 MG) BY MOUTH TWICE DAILY AS NEEDED 30 tablet 0  . traZODone (DESYREL) 150 MG tablet TAKE 1 AND 2/3 TO 2 TABLETS BY MOUTH AT BEDTIME AS DIRECTED (Patient taking differently: Take 250 mg by mouth at bedtime (1 and 2/3 tablets)) 180 tablet 2  . traZODone (DESYREL) 150 MG tablet TAKE 1 AND 2/3 TO 2 TABLETS BY MOUTH AT BEDTIME AS DIRECTED 180 tablet 0  . verapamil (CALAN-SR) 180 MG CR tablet Take 1 tablet (180 mg total) by mouth 2 (two) times daily. 180 tablet 4  . vitamin B-12 (CYANOCOBALAMIN) 1000 MCG tablet Take 1,000 mcg by mouth daily.    . vitamin C (ASCORBIC ACID) 500 MG tablet Take 500 mg by mouth at bedtime.     No current facility-administered medications on file prior to visit.      Family Hx: The patient's family history includes Alcohol abuse in her father; Depression in her daughter, father, maternal grandmother, and son; Heart disease in her father; Hypertension in her maternal grandfather and  paternal grandfather; Lung cancer in her sister; Seizures in her son; Stroke in her maternal grandmother; Thyroid disease in her daughter and mother. There is no history of Breast cancer.  ROS:   Please see the history of present illness.    Review of Systems  Constitutional: Negative.   HENT: Negative.   Respiratory: Positive for shortness of breath.   Cardiovascular: Negative.   Gastrointestinal: Negative.   Musculoskeletal: Negative.        Gait instability  Neurological: Negative.   Psychiatric/Behavioral: Negative.   All other systems reviewed and are negative.    Labs/Other Tests and Data Reviewed:    Recent Labs: 03/25/2018: B Natriuretic Peptide 1,912.0 03/30/2018: Magnesium 2.1; TSH 2.962 12/02/2018: ALT 22; BUN 19; Creatinine, Ser 0.96; Hemoglobin 13.9; Platelets 228.0; Potassium 4.2; Sodium 134   Recent Lipid Panel Lab Results  Component Value Date/Time   CHOL 142 12/02/2018 02:50 PM   TRIG 76.0 12/02/2018 02:50 PM   HDL 73.70 12/02/2018 02:50 PM   CHOLHDL 2  12/02/2018 02:50 PM   LDLCALC 53 12/02/2018 02:50 PM   LDLDIRECT 126.7 07/03/2013 09:37 AM    Wt Readings from Last 3 Encounters:  11/11/18 180 lb 12.8 oz (82 kg)  08/26/18 179 lb (81.2 kg)  07/15/18 173 lb 6 oz (78.6 kg)     Exam:    Vital Signs: Vital signs may also be detailed in the HPI There were no vitals taken for this visit.  Wt Readings from Last 3 Encounters:  11/11/18 180 lb 12.8 oz (82 kg)  08/26/18 179 lb (81.2 kg)  07/15/18 173 lb 6 oz (78.6 kg)   Temp Readings from Last 3 Encounters:  11/11/18 (!) 97.4 F (36.3 C) (Oral)  07/22/18 97.6 F (36.4 C) (Oral)  07/15/18 98.2 F (36.8 C) (Oral)   BP Readings from Last 3 Encounters:  11/11/18 110/62  08/26/18 140/80  07/22/18 116/78   Pulse Readings from Last 3 Encounters:  11/11/18 (!) 102  08/26/18 66  07/22/18 96    Pulse 68 to  125/74 resp 16 Sat 97%  Well nourished, well developed female in no acute distress. Constitutional:  oriented to person, place, and time. No distress.  Head: Normocephalic and atraumatic.  Eyes:  no discharge. No scleral icterus.  Neck: Normal range of motion. Neck supple.  Pulmonary/Chest: No audible wheezing, no distress, appears comfortable Musculoskeletal: Normal range of motion.  no  tenderness or deformity.  Neurological:   Coordination normal. Full exam not performed Skin:  No rash Psychiatric:  normal mood and affect. behavior is normal. Thought content normal.    ASSESSMENT & PLAN:    Persistent atrial fibrillation On eliquis, rate well controlled  Chronic diastolic heart failure (HCC) Euvolemic,  Hypertensive heart disease without heart failure  Coronary artery disease of native artery of native heart with stable angina pectoris (HCC) Currently with no symptoms of angina. No further workup at this time. Continue current medication regimen.  Hypercholesterolemia Cholesterol is at goal on the current lipid regimen. No changes to the medications were made.   Essential hypertension, benign Blood pressure is well controlled on today's visit. No changes made to the medications.  Dyspnea, unspecified type Stable, exercising On lasix daily  TGA (transient global amnesia) No sx, stable   COVID-19 Education: The signs and symptoms of COVID-19 were discussed with the patient and how to seek care for testing (follow up with PCP or arrange E-visit).  The importance of social distancing was discussed today.  Patient Risk:  After full review of this patients clinical status, I feel that they are at least moderate risk at this time.  Time:   Today, I have spent 25 minutes with the patient with telehealth technology discussing the cardiac and medical problems/diagnoses detailed above   10 min spent reviewing the chart prior to patient visit today   Medication Adjustments/Labs and Tests Ordered: Current medicines are reviewed at length with the patient today.  Concerns regarding medicines are outlined above.   Tests Ordered: No tests ordered   Medication Changes: No changes made   Disposition: Follow-up in 6 months   Signed, Ida Rogue, MD  02/24/2019 2:44 PM    [place] Office [address] [id number], [place], [place] [address]

## 2019-02-24 ENCOUNTER — Other Ambulatory Visit: Payer: Self-pay

## 2019-02-24 ENCOUNTER — Telehealth (INDEPENDENT_AMBULATORY_CARE_PROVIDER_SITE_OTHER): Payer: Medicare Other | Admitting: Cardiovascular Disease

## 2019-02-24 DIAGNOSIS — I5032 Chronic diastolic (congestive) heart failure: Secondary | ICD-10-CM | POA: Diagnosis not present

## 2019-02-24 DIAGNOSIS — E7800 Pure hypercholesterolemia, unspecified: Secondary | ICD-10-CM

## 2019-02-24 DIAGNOSIS — I10 Essential (primary) hypertension: Secondary | ICD-10-CM | POA: Diagnosis not present

## 2019-02-24 DIAGNOSIS — I4819 Other persistent atrial fibrillation: Secondary | ICD-10-CM

## 2019-02-24 DIAGNOSIS — G454 Transient global amnesia: Secondary | ICD-10-CM | POA: Diagnosis not present

## 2019-02-24 DIAGNOSIS — I119 Hypertensive heart disease without heart failure: Secondary | ICD-10-CM | POA: Diagnosis not present

## 2019-02-24 DIAGNOSIS — R0600 Dyspnea, unspecified: Secondary | ICD-10-CM

## 2019-02-24 DIAGNOSIS — I25118 Atherosclerotic heart disease of native coronary artery with other forms of angina pectoris: Secondary | ICD-10-CM | POA: Diagnosis not present

## 2019-02-24 NOTE — Patient Instructions (Signed)

## 2019-03-16 ENCOUNTER — Telehealth: Payer: Self-pay | Admitting: *Deleted

## 2019-03-16 NOTE — Telephone Encounter (Signed)
Cancelled appt  Unable to lm to sched annual appt

## 2019-03-16 NOTE — Telephone Encounter (Signed)
Copied from [place] [id number]. Topic: Quick Communication - Appointment Cancellation >> Mar 16, 2019  1:23 PM Nils Flack wrote: Patient called to cancel appointment scheduled for 03/19/19. Patient has not rescheduled their appointment. Pt Is stable and only wants to come for annual visits.  She saw dr Rockey Situ and he was very impressed with her.  Route to department's PEC pool.

## 2019-03-19 ENCOUNTER — Ambulatory Visit: Payer: Medicare Other | Admitting: Internal Medicine

## 2019-04-01 ENCOUNTER — Telehealth: Payer: Self-pay

## 2019-04-01 NOTE — Telephone Encounter (Signed)
Started PA for Repatha through [employer] (Key: [id number])  Repatha SureClick 140MG /ML auto-injectors  Awaiting response.

## 2019-04-08 NOTE — Telephone Encounter (Signed)
Effective from 04/01/2019 through 03/31/2020.

## 2019-04-12 ENCOUNTER — Other Ambulatory Visit: Payer: Self-pay | Admitting: Cardiovascular Disease

## 2019-04-13 ENCOUNTER — Other Ambulatory Visit: Payer: Self-pay | Admitting: Cardiovascular Disease

## 2019-04-15 ENCOUNTER — Other Ambulatory Visit: Payer: Self-pay | Admitting: Cardiovascular Disease

## 2019-04-15 NOTE — Telephone Encounter (Signed)
Pt's wt 82 kg, age 83, SCr 096, CrCl 59.5.

## 2019-04-15 NOTE — Telephone Encounter (Signed)
Refill Request.  

## 2019-04-16 ENCOUNTER — Telehealth: Payer: Self-pay | Admitting: Cardiovascular Disease

## 2019-04-16 NOTE — Telephone Encounter (Signed)
Patient calling Patient went to pick up Eliquis and the medication was too expensive  Would like to discuss patient assistance options  Please call to discuss

## 2019-04-17 ENCOUNTER — Other Ambulatory Visit: Payer: Self-pay | Admitting: Cardiovascular Disease

## 2019-04-17 NOTE — Telephone Encounter (Signed)
Patient notified per the pharmacist Audelia Acton) with [facility] there is no prior authorization required for the Eliquis. The cost after insurance is $365.00 and without insurance is $1,700.00. The patient was notified she could try and apply for patient assistance but she stated, she would not qualify due to she makes too much each month. The patient will just continue to pay the $365.00 until the first of the year.

## 2019-04-17 NOTE — Telephone Encounter (Signed)
Spoke with the pt. Pt sts that she went to pick up her Eliquis yesterday and her out of pocket cost was $365 for her 90 day supply. Pt sts that she has never paid that amount before and cannot afford it. Pt sts that we normally "normally work something out" with [employer]. Reviewed the pt chart. There is no past documentation for patient assistance for Eliquis. The pt may be referring to a prior auth. Adv the pt that we will ck to see if a prior auth is required and call her back with more info.  Pt wanted to make sure that we had her Medicare part D info. Plan # [id number] Member ID # [id number]

## 2019-04-18 ENCOUNTER — Other Ambulatory Visit: Payer: Self-pay | Admitting: Cardiovascular Disease

## 2019-05-03 ENCOUNTER — Other Ambulatory Visit: Payer: Self-pay | Admitting: Cardiovascular Disease

## 2019-05-07 ENCOUNTER — Other Ambulatory Visit: Payer: Self-pay

## 2019-05-12 ENCOUNTER — Other Ambulatory Visit: Payer: Self-pay | Admitting: Cardiovascular Disease

## 2019-05-20 ENCOUNTER — Other Ambulatory Visit: Payer: Self-pay

## 2019-05-20 ENCOUNTER — Encounter: Payer: Self-pay | Admitting: Internal Medicine

## 2019-05-20 ENCOUNTER — Ambulatory Visit (INDEPENDENT_AMBULATORY_CARE_PROVIDER_SITE_OTHER): Payer: Medicare Other | Admitting: Internal Medicine

## 2019-05-20 DIAGNOSIS — R531 Weakness: Secondary | ICD-10-CM | POA: Diagnosis not present

## 2019-05-20 DIAGNOSIS — R739 Hyperglycemia, unspecified: Secondary | ICD-10-CM

## 2019-05-20 DIAGNOSIS — E7800 Pure hypercholesterolemia, unspecified: Secondary | ICD-10-CM

## 2019-05-20 DIAGNOSIS — K2100 Gastro-esophageal reflux disease with esophagitis, without bleeding: Secondary | ICD-10-CM

## 2019-05-20 DIAGNOSIS — J4520 Mild intermittent asthma, uncomplicated: Secondary | ICD-10-CM

## 2019-05-20 DIAGNOSIS — F329 Major depressive disorder, single episode, unspecified: Secondary | ICD-10-CM

## 2019-05-20 DIAGNOSIS — I5032 Chronic diastolic (congestive) heart failure: Secondary | ICD-10-CM

## 2019-05-20 DIAGNOSIS — I25118 Atherosclerotic heart disease of native coronary artery with other forms of angina pectoris: Secondary | ICD-10-CM | POA: Diagnosis not present

## 2019-05-20 DIAGNOSIS — D649 Anemia, unspecified: Secondary | ICD-10-CM | POA: Diagnosis not present

## 2019-05-20 DIAGNOSIS — R2681 Unsteadiness on feet: Secondary | ICD-10-CM | POA: Diagnosis not present

## 2019-05-20 DIAGNOSIS — I10 Essential (primary) hypertension: Secondary | ICD-10-CM | POA: Diagnosis not present

## 2019-05-20 DIAGNOSIS — I4891 Unspecified atrial fibrillation: Secondary | ICD-10-CM | POA: Diagnosis not present

## 2019-05-20 DIAGNOSIS — F32A Depression, unspecified: Secondary | ICD-10-CM

## 2019-05-20 NOTE — Progress Notes (Signed)
Patient ID: Charlotte Sessions, MD, female   DOB: 1936/03/14, 83 y.o.   MRN: [id number]   Virtual Visit via  Note  This visit type was conducted due to national recommendations for restrictions regarding the COVID-19 pandemic (e.g. social distancing).  This format is felt to be most appropriate for this patient at this time.  All issues noted in this document were discussed and addressed.  No physical exam was performed (except for noted visual exam findings with Video Visits).   I connected with [name] by telephone and verified that I am speaking with the correct person using two identifiers. Location patient: home Location provider: work Persons participating in the telephone visit: patient, provider  I discussed the limitations, risks, security and privacy concerns of performing an evaluation and management service by telephone and the availability of in person appointments. The patient expressed understanding and agreed to proceed.   Reason for visit: scheduled follow up.   HPI: She reports she is doing relatively well.  Breathing is stable.  Saw Dr Rockey Situ 02/24/19 for f/u afib. And chronic diastolic heart failure.  Felt stable.  No changes made.  No chest pain.  Eating well.  Blood pressure doing well.  No acid reflux.  No abdominal pain.  Bowels moving.  Did aggravate her right shoulder - heavy door.  Is better.  She does report weakness - anterior thighs and lower extremities.  Some unsteadiness.  Discussed therapy.  She desires physical therapy.  Staying in due to covid restrictions.  Handling stress.     ROS: See pertinent positives and negatives per HPI.  Past Medical History:  Diagnosis Date   Allergy    Arthritis    s/p bilateral knees and left hip replacement   Asthma    CAD (coronary artery disease)    a. 12/1996 s/p CABG x4 [facility], [place]);  b. 2005 Pt reports stress test & cath, which revealed patent grafts.   Cancer [facility])    melanoma right arm   Carotid arterial  disease ([place])    a. 04/2015 Carotid U/S: <50% bilat ICA stenosis.   Chronic kidney disease    Colon polyps    H/O   Depression    GERD (gastroesophageal reflux disease)    h/o hiatal hernia   Headache    migraines in past   Heart murmur    a. 04/2011 Echo: EF 55-60%, bilat atrial enlargement, mild to mod TR.   History of chicken pox    History of hiatal hernia    Hx of migraines    rare now   Hx: UTI (urinary tract infection)    Hyperlipidemia    a. Statin intolerant -->on zetia.   Hypertension    Hypertensive heart disease    Lichen planus    Melanoma (HCC)    Palpitations    a. rare PVC's and h/o SVT.   PMR (polymyalgia rheumatica) (HCC)    h/o in setting of crestor usage.   PSVT (paroxysmal supraventricular tachycardia) (HCC)    PUD (peptic ulcer disease)    remote history   Raynaud's phenomenon    Spinal stenosis    Urine incontinence    H/O   Vitamin D deficiency     Past Surgical History:  Procedure Laterality Date   ADENOIDECTOMY     age 26   BACK SURGERY     L3-L5   BILATERAL CARPAL TUNNEL RELEASE     BREAST BIOPSY Right    bx x 3 neg  BREAST SURGERY Right    biopsy x 3 (all benign)   CARDIAC CATHETERIZATION N/A 06/01/2016   Procedure: LEFT HEART CATH AND CORS/GRAFTS ANGIOGRAPHY;  Surgeon: Wellington Hampshire, MD;  Location: [place] [facility];  Service: Cardiovascular;  Laterality: N/A;   CARDIAC CATHETERIZATION N/A 06/01/2016   Procedure: Coronary Stent Intervention;  Surgeon: Wellington Hampshire, MD;  Location: [place] [facility];  Service: Cardiovascular;  Laterality: N/A;   CARDIOVERSION N/A 03/08/2017   Procedure: CARDIOVERSION;  Surgeon: Wellington Hampshire, MD;  Location: [facility];  Service: Cardiovascular;  Laterality: N/A;   CATARACT EXTRACTION W/PHACO Right 01/04/2016   Procedure: CATARACT EXTRACTION PHACO AND INTRAOCULAR LENS PLACEMENT ([place]);  Surgeon: Leandrew Koyanagi, MD;  Location: [place];  Service:  Ophthalmology;  Laterality: Right;  MALYUGIN   CATARACT EXTRACTION W/PHACO Left 01/25/2016   Procedure: CATARACT EXTRACTION PHACO AND INTRAOCULAR LENS PLACEMENT ([place]) left eye;  Surgeon: Leandrew Koyanagi, MD;  Location: [place];  Service: Ophthalmology;  Laterality: Left;  MALYUGIN SHUGARCAINE   CHOLECYSTECTOMY  90's   COLONOSCOPY WITH PROPOFOL N/A 05/03/2015   Procedure: COLONOSCOPY WITH PROPOFOL;  Surgeon: Lollie Sails, MD;  Location: [facility];  Service: Endoscopy;  Laterality: N/A;   CORONARY ARTERY BYPASS GRAFT  98   4 vessel   CORONARY STENT INTERVENTION N/A 03/31/2018   Procedure: CORONARY STENT INTERVENTION;  Surgeon: Wellington Hampshire, MD;  Location: [place] [facility];  Service: Cardiovascular;  Laterality: N/A;   ESOPHAGOGASTRODUODENOSCOPY N/A 03/22/2015   Procedure: ESOPHAGOGASTRODUODENOSCOPY (EGD);  Surgeon: Lollie Sails, MD;  Location: [facility];  Service: Endoscopy;  Laterality: N/A;   HEMORRHOID BANDING     HEMORRHOID SURGERY N/A 02/17/2018   Procedure: HEMORRHOIDECTOMY;  Surgeon: Robert Bellow, MD;  Location: [facility];  Service: General;  Laterality: N/A;   JOINT REPLACEMENT     BILATERAL KNEE REPLACEMENTS   KNEE ARTHROSCOPY W/ OATS PROCEDURE     Lt knee (9/01), Rt knee (3/11), Lt hip (5/10)   LEFT HEART CATH AND CORONARY ANGIOGRAPHY N/A 03/31/2018   Procedure: LEFT HEART CATH AND CORONARY ANGIOGRAPHY;  Surgeon: Wellington Hampshire, MD;  Location: [place] [facility];  Service: Cardiovascular;  Laterality: N/A;   TOTAL HIP ARTHROPLASTY      Family History  Problem Relation Age of Onset   Thyroid disease Mother        graves disease   Heart disease Father        rheumatic heart   Alcohol abuse Father    Depression Father    Lung cancer Sister    Depression Daughter    Thyroid disease Daughter        hashimoto   Depression Son    Stroke Maternal Grandmother    Depression Maternal Grandmother     Hypertension Maternal Grandfather    Hypertension Paternal Grandfather    Seizures Son    Breast cancer Neg Hx     SOCIAL HX: reviewed.    Current Outpatient Medications:    acetaminophen (TYLENOL) 500 MG tablet, Take 1,000 mg by mouth 2 (two) times daily. , Disp: , Rfl:    ADVAIR DISKUS 250-50 MCG/DOSE AEPB, INHALE 1 PUFF BY MOUTH TWICE DAILY (Patient taking differently: INHALE 1 PUFF BY MOUTH TWICE DAILY as needed when she gets allergies or bad colds), Disp: 14 each, Rfl: 0   amitriptyline (ELAVIL) 25 MG tablet, Take 25 mg by mouth at bedtime, Disp: 60 tablet, Rfl: 5   ATROVENT HFA 17 MCG/ACT inhaler, inhale  2 puffs INTO THE LUNGS every 6 hours if needed for wheezing, Disp: 12.9 g, Rfl: 2   b complex vitamins tablet, Take 1 tablet by mouth daily., Disp: , Rfl:    Cholecalciferol (VITAMIN D3) 2000 UNITS TABS, Take 2,000 Units by mouth 2 (two) times a week. , Disp: , Rfl:    clopidogrel (PLAVIX) 75 MG tablet, TAKE 1 TABLET(75 MG) BY MOUTH DAILY WITH BREAKFAST, Disp: 90 tablet, Rfl: 3   Coenzyme Q10 (COQ-10) 200 MG CAPS, Take 200 mg by mouth daily. , Disp: , Rfl:    conjugated estrogens (PREMARIN) vaginal cream, Apply 0.39m (pea-sized amount)  just inside the vaginal introitus with a finger-tip on  Monday, Wednesday and Friday nights., Disp: 30 g, Rfl: 12   ELIQUIS 5 MG TABS tablet, TAKE 1 TABLET(5 MG) BY MOUTH TWICE DAILY, Disp: 180 tablet, Rfl: 1   ezetimibe (ZETIA) 10 MG tablet, TAKE 1 TABLET(10 MG) BY MOUTH DAILY, Disp: 90 tablet, Rfl: 0   ferrous sulfate 325 (65 FE) MG tablet, Take 1 tablet (325 mg total) by mouth 2 (two) times daily with a meal., Disp: 60 tablet, Rfl: 3   folic acid (FOLVITE) 1 MG tablet, TAKE 1 TABLET(1 MG) BY MOUTH DAILY, Disp: 30 tablet, Rfl: 0   furosemide (LASIX) 40 MG tablet, TAKE 1 TABLET(40 MG) BY MOUTH DAILY, Disp: 90 tablet, Rfl: 3   hydrocortisone 2.5 % cream, Apply to VULVA at bedtime as needed with miconazole for lichen planus, Disp: 28 g,  Rfl: 0   ketoconazole (NIZORAL) 2 % cream, Apply 1 application topically daily as needed (for crack bleeding fingers.)., Disp: 15 g, Rfl: 0   loratadine (CLARITIN) 10 MG tablet, Take 10 mg by mouth daily., Disp: , Rfl:    meclizine (ANTIVERT) 25 MG tablet, Take 25 mg by mouth 3 (three) times daily as needed for dizziness., Disp: , Rfl:    miconazole (MICOTIN) 2 % cream, Apply 1 application topically at bedtime as needed (with hydrocortisone for lichen planus). , Disp: , Rfl:    miconazole (MICOTIN) 2 % powder, Apply topically as needed for itching., Disp: 70 g, Rfl: 0   Multiple Vitamins-Minerals (HAIR/SKIN/NAILS PO), Take 1 tablet by mouth daily., Disp: , Rfl:    mupirocin ointment (BACTROBAN) 2 %, Apply to affected area bid, Disp: 22 g, Rfl: 0   nitroGLYCERIN (NITROSTAT) 0.4 MG SL tablet, Place 1 tablet (0.4 mg total) under the tongue every 5 (five) minutes as needed for chest pain., Disp: 25 tablet, Rfl: 6   pantoprazole (PROTONIX) 40 MG tablet, Take 1 tablet (40 mg total) by mouth 2 (two) times daily., Disp: 180 tablet, Rfl: 2   polyethylene glycol powder (GLYCOLAX/MIRALAX) powder, Take 8.5 g by mouth daily as needed. , Disp: , Rfl:    ramipril (ALTACE) 5 MG capsule, TAKE 1 CAPSULE(5 MG) BY MOUTH DAILY, Disp: 90 capsule, Rfl: 1   REPATHA SURECLICK 1476MG/ML SOAJ, INJECT 1 PEN INTO THE SKIN EVERY 14 DAYS, Disp: 2 mL, Rfl: 2   spironolactone (ALDACTONE) 25 MG tablet, TAKE 1 TABLET(25 MG) BY MOUTH EVERY MORNING, Disp: 90 tablet, Rfl: 3   traMADol (ULTRAM) 50 MG tablet, TAKE 1 TABLET(50 MG) BY MOUTH TWICE DAILY AS NEEDED, Disp: 30 tablet, Rfl: 0   traZODone (DESYREL) 150 MG tablet, TAKE 1 AND 2/3 TO 2 TABLETS BY MOUTH AT BEDTIME AS DIRECTED, Disp: 180 tablet, Rfl: 0   verapamil (CALAN-SR) 180 MG CR tablet, TAKE 1 TABLET(180 MG) BY MOUTH TWICE DAILY, Disp: 180 tablet,  Rfl: 3   vitamin B-12 (CYANOCOBALAMIN) 1000 MCG tablet, Take 1,000 mcg by mouth daily., Disp: , Rfl:    vitamin C  (ASCORBIC ACID) 500 MG tablet, Take 500 mg by mouth at bedtime., Disp: , Rfl:   EXAM:  VITALS per patient if applicable: 751/02, 88, 97%  GENERAL: alert.  Sounds to be in no acute distress.  Answering questions appropriately.    PSYCH/NEURO: pleasant and cooperative, no obvious depression or anxiety, speech and thought processing grossly intact  ASSESSMENT AND PLAN:  Discussed the following assessment and plan:  Anemia Follow cbc.   Asthma Breathing stable.   CAD (coronary artery disease) S/p DES placement.  Continues on plavix and eliquis.  Followed by cardiology.  Stable.    Chronic diastolic heart failure (HCC) Does not sound to be volume overloaded.  Reports weight is stable.  Followed by cardiology.  Stable.    Depression Doing well on current regimen.  Stable.   Essential hypertension, benign Blood pressure under good control.  Continue same medication regimen.  Follow pressures.  Follow metabolic panel.    GERD (gastroesophageal reflux disease) Controlled.    Hypercholesterolemia On zetia.  Low cholesterol diet and exercise.  Follow lipid panel.    Atrial fibrillation ([place]) On eliquis.  Stable.  Followed by cardiology.   Weakness Reports leg weakness and unsteadiness.  Refer to physical therapy to evaluate and treat.    Unsteadiness Reports unsteadiness and weakness.  Refer to physical therapy to evaluate and treat.    Hyperglycemia Follow  Met b and a1c.     I discussed the assessment and treatment plan with the patient. The patient was provided an opportunity to ask questions and all were answered. The patient agreed with the plan and demonstrated an understanding of the instructions.   The patient was advised to call back or seek an in-person evaluation if the symptoms worsen or if the condition fails to improve as anticipated.  I provided 18 minutes of non-face-to-face time during this encounter.   [name], MD

## 2019-05-24 ENCOUNTER — Encounter: Payer: Self-pay | Admitting: Internal Medicine

## 2019-05-24 DIAGNOSIS — R739 Hyperglycemia, unspecified: Secondary | ICD-10-CM | POA: Insufficient documentation

## 2019-05-24 NOTE — Assessment & Plan Note (Signed)
S/p DES placement.  Continues on plavix and eliquis.  Followed by cardiology.  Stable.

## 2019-05-24 NOTE — Assessment & Plan Note (Signed)
Blood pressure under good control.  Continue same medication regimen.  Follow pressures.  Follow metabolic panel.   

## 2019-05-24 NOTE — Assessment & Plan Note (Signed)
Breathing stable.

## 2019-05-24 NOTE — Assessment & Plan Note (Signed)
Reports unsteadiness and weakness.  Refer to physical therapy to evaluate and treat.

## 2019-05-24 NOTE — Assessment & Plan Note (Signed)
Does not sound to be volume overloaded.  Reports weight is stable.  Followed by cardiology.  Stable.

## 2019-05-24 NOTE — Assessment & Plan Note (Signed)
On eliquis.  Stable.  Followed by cardiology.   

## 2019-05-24 NOTE — Assessment & Plan Note (Signed)
Doing well on current regimen.  Stable.

## 2019-05-24 NOTE — Assessment & Plan Note (Signed)
On zetia.  Low cholesterol diet and exercise.  Follow lipid panel.  

## 2019-05-24 NOTE — Assessment & Plan Note (Signed)
Controlled.  

## 2019-05-24 NOTE — Assessment & Plan Note (Signed)
Follow  Met b and a1c.

## 2019-05-24 NOTE — Assessment & Plan Note (Signed)
Reports leg weakness and unsteadiness.  Refer to physical therapy to evaluate and treat.

## 2019-05-24 NOTE — Assessment & Plan Note (Signed)
Follow cbc.  

## 2019-05-26 ENCOUNTER — Telehealth: Payer: Self-pay

## 2019-05-26 ENCOUNTER — Other Ambulatory Visit: Payer: Self-pay

## 2019-05-26 ENCOUNTER — Ambulatory Visit (INDEPENDENT_AMBULATORY_CARE_PROVIDER_SITE_OTHER): Payer: Medicare Other

## 2019-05-26 DIAGNOSIS — Z0000 Encounter for general adult medical examination without abnormal findings: Secondary | ICD-10-CM

## 2019-05-26 NOTE — Telephone Encounter (Signed)
Called patient for scheduled annual wellness visit. No answer. Left message asking for a call back during appointment window or reschedule as appropriate.

## 2019-05-26 NOTE — Progress Notes (Signed)
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  Review of Systems:  No ROS.  Medicare Wellness Virtual Visit.  Visual/audio telehealth visit, UTA vital signs.   See social history for additional risk factors.   Cardiac Risk Factors include: advanced age (>42mn, >>64women);hypertension     Objective:     Vitals: There were no vitals taken for this visit.  There is no height or weight on file to calculate BMI.  Advanced Directives 05/26/2019 05/07/2018 03/31/2018 03/26/2018 03/25/2018 03/21/2018 03/18/2018  Does Patient Have a Medical Advance Directive? Yes Yes - Yes Yes Yes Yes  Type of AParamedicof AMississippi Valley State UniversityLiving will - HLeetoniaLiving will;Out of facility DNR (pink MOST or yellow form) HMinaLiving will;Out of facility DNR (pink MOST or yellow form) HSouthern PinesLiving will HUnion ParkLiving will  Does patient want to make changes to medical advance directive? No - Patient declined - No - Patient declined No - Patient declined No - Patient declined No - Patient declined No - Patient declined  Copy of HClevelandin Chart? No - copy requested Yes - No - copy requested No - copy requested No - copy requested No - copy requested  Would patient like information on creating a medical advance directive? - - - - - - -  Pre-existing out of facility DNR order (yellow form or pink MOST form) - - - - Yellow form placed in chart (order not valid for inpatient use) - -    Tobacco Social History   Tobacco Use  Smoking Status Former Smoker  Smokeless Tobacco Never Used  Tobacco Comment   quit 47+ yrs ago     Counseling given: Not Answered Comment: quit 47+ yrs ago   Clinical Intake:  Pre-visit preparation completed: Yes        Diabetes: No  How often do you need  to have someone help you when you read instructions, pamphlets, or other written materials from your doctor or pharmacy?: 1 - Never  Interpreter Needed?: No     Past Medical History:  Diagnosis Date  . Allergy   . Arthritis    s/p bilateral knees and left hip replacement  . Asthma   . CAD (coronary artery disease)    a. 12/1996 s/p CABG x4 ([place];  b. 2005 Pt reports stress test & cath, which revealed patent grafts.  . Cancer ([place]    melanoma right arm  . Carotid arterial disease ([place]ur[place]    a. 04/2015 Carotid U/S: <50% bilat ICA stenosis.  . Chronic kidney disease   . Colon polyps    H/O  . Depression   . GERD (gastroesophageal reflux disease)    h/o hiatal hernia  . Headache    migraines in past  . Heart murmur    a. 04/2011 Echo: EF 55-60%, bilat atrial enlargement, mild to mod TR.  .Marland KitchenHistory of chicken pox   . History of hiatal hernia   . Hx of migraines    rare now  . Hx: UTI (urinary tract infection)   . Hyperlipidemia    a. Statin intolerant -->on zetia.  . Hypertension   . Hypertensive heart disease   . Lichen planus   . Melanoma (HTattnall   . Palpitations    a. rare PVC's and h/o SVT.  .Marland KitchenPMR (polymyalgia rheumatica) (HCC)  h/o in setting of crestor usage.  [name] PSVT (paroxysmal supraventricular tachycardia) ([place])   . PUD (peptic ulcer disease)    remote history  . Raynaud's phenomenon   . Spinal stenosis   . Urine incontinence    H/O  . Vitamin D deficiency    Past Surgical History:  Procedure Laterality Date  . ADENOIDECTOMY     age 77  . BACK SURGERY     L3-L5  . BILATERAL CARPAL TUNNEL RELEASE    . BREAST BIOPSY Right    bx x 3 neg  . BREAST SURGERY Right    biopsy x 3 (all benign)  . CARDIAC CATHETERIZATION N/A 06/01/2016   Procedure: LEFT HEART CATH AND CORS/GRAFTS ANGIOGRAPHY;  Surgeon: Wellington Hampshire, MD;  Location: [place] [facility];  Service: Cardiovascular;  Laterality: N/A;  . CARDIAC CATHETERIZATION N/A 06/01/2016   Procedure:  Coronary Stent Intervention;  Surgeon: Wellington Hampshire, MD;  Location: [place] [facility];  Service: Cardiovascular;  Laterality: N/A;  . CARDIOVERSION N/A 03/08/2017   Procedure: CARDIOVERSION;  Surgeon: Wellington Hampshire, MD;  Location: [facility];  Service: Cardiovascular;  Laterality: N/A;  . CATARACT EXTRACTION W/PHACO Right 01/04/2016   Procedure: CATARACT EXTRACTION PHACO AND INTRAOCULAR LENS PLACEMENT ([place]);  Surgeon: Leandrew Koyanagi, MD;  Location: [place];  Service: Ophthalmology;  Laterality: Right;  MALYUGIN  . CATARACT EXTRACTION W/PHACO Left 01/25/2016   Procedure: CATARACT EXTRACTION PHACO AND INTRAOCULAR LENS PLACEMENT ([place]) left eye;  Surgeon: Leandrew Koyanagi, MD;  Location: [place];  Service: Ophthalmology;  Laterality: Left;  MALYUGIN SHUGARCAINE  . CHOLECYSTECTOMY  90's  . COLONOSCOPY WITH PROPOFOL N/A 05/03/2015   Procedure: COLONOSCOPY WITH PROPOFOL;  Surgeon: Lollie Sails, MD;  Location: [facility];  Service: Endoscopy;  Laterality: N/A;  . CORONARY ARTERY BYPASS GRAFT  98   4 vessel  . CORONARY STENT INTERVENTION N/A 03/31/2018   Procedure: CORONARY STENT INTERVENTION;  Surgeon: Wellington Hampshire, MD;  Location: [place] [facility];  Service: Cardiovascular;  Laterality: N/A;  . ESOPHAGOGASTRODUODENOSCOPY N/A 03/22/2015   Procedure: ESOPHAGOGASTRODUODENOSCOPY (EGD);  Surgeon: Lollie Sails, MD;  [facility] END[facility];  Service: Endoscopy;  Laterality: N/A;  . HEMORRHOID BANDING    . HEMORRHOID SURGERY N/A 02/17/2018   Procedure: HEMORRHOIDECTOMY;  Surgeon: Robert Bellow, MD;  Location: [facility];  Service: General;  Laterality: N/A;  . JOINT REPLACEMENT     BILATERAL KNEE REPLACEMENTS  . KNEE ARTHROSCOPY W/ OATS PROCEDURE     Lt knee (9/01), Rt knee (3/11), Lt hip (5/10)  . LEFT HEART CATH AND CORONARY ANGIOGRAPHY N/A 03/31/2018   Procedure: LEFT HEART CATH AND CORONARY ANGIOGRAPHY;  Surgeon: Wellington Hampshire, MD;   Location: [place] [facility];  Service: Cardiovascular;  Laterality: N/A;  . TOTAL HIP ARTHROPLASTY     Family History  Problem Relation Age of Onset  . Thyroid disease Mother        graves disease  . Heart disease Father        rheumatic heart  . Alcohol abuse Father   . Depression Father   . Lung cancer Sister   . Depression Daughter   . Thyroid disease Daughter        hashimoto  . Depression Son   . Stroke Maternal Grandmother   . Depression Maternal Grandmother   . Hypertension Maternal Grandfather   . Hypertension Paternal Grandfather   . Seizures Son   . Breast cancer Neg Hx  Social History   Socioeconomic History  . Marital status: Married    Spouse name: Not on file  . Number of children: 3  . Years of education: Not on file  . Highest education level: Not on file  Occupational History  . Occupation: Retired Sport and exercise psychologist  Social Needs  . Financial resource strain: Not on file  . Food insecurity    Worry: Not on file    Inability: Not on file  . Transportation needs    Medical: Not on file    Non-medical: Not on file  Tobacco Use  . Smoking status: Former Research scientist (life sciences)  . Smokeless tobacco: Never Used  . Tobacco comment: quit 47+ yrs ago  Substance and Sexual Activity  . Alcohol use: No    Alcohol/week: 0.0 standard drinks  . Drug use: No  . Sexual activity: Never  Lifestyle  . Physical activity    Days per week: 5 days    Minutes per session: 30 min  . Stress: Not at all  Relationships  . Social Herbalist on phone: Not on file    Gets together: Not on file    Attends religious service: Not on file    Active member of club or organization: Not on file    Attends meetings of clubs or organizations: Not on file    Relationship status: Not on file  Other Topics Concern  . Not on file  Social History Narrative   Lives in [place].  Retired Engineer, drilling.  Relatively active.   Uses a Rollator to ambulate    Outpatient Encounter  Medications as of 05/26/2019  Medication Sig  . acetaminophen (TYLENOL) 500 MG tablet Take 1,000 mg by mouth 2 (two) times daily.   [name] ADVAIR DISKUS 250-50 MCG/DOSE AEPB INHALE 1 PUFF BY MOUTH TWICE DAILY (Patient taking differently: INHALE 1 PUFF BY MOUTH TWICE DAILY as needed when she gets allergies or bad colds)  . amitriptyline (ELAVIL) 25 MG tablet Take 25 mg by mouth at bedtime  . ATROVENT HFA 17 MCG/ACT inhaler inhale 2 puffs INTO THE LUNGS every 6 hours if needed for wheezing  . b complex vitamins tablet Take 1 tablet by mouth daily.  . Cholecalciferol (VITAMIN D3) 2000 UNITS TABS Take 2,000 Units by mouth 2 (two) times a week.   . clopidogrel (PLAVIX) 75 MG tablet TAKE 1 TABLET(75 MG) BY MOUTH DAILY WITH BREAKFAST  . Coenzyme Q10 (COQ-10) 200 MG CAPS Take 200 mg by mouth daily.   [name] conjugated estrogens (PREMARIN) vaginal cream Apply 0.'5mg'$  (pea-sized amount)  just inside the vaginal introitus with a finger-tip on  Monday, Wednesday and Friday nights.  . CRANBERRY PO Take 1 capsule by mouth daily.  [name] ELIQUIS 5 MG TABS tablet TAKE 1 TABLET(5 MG) BY MOUTH TWICE DAILY  . ezetimibe (ZETIA) 10 MG tablet TAKE 1 TABLET(10 MG) BY MOUTH DAILY  . ferrous sulfate 325 (65 FE) MG tablet Take 1 tablet (325 mg total) by mouth 2 (two) times daily with a meal.  . folic acid (FOLVITE) 1 MG tablet TAKE 1 TABLET(1 MG) BY MOUTH DAILY  . furosemide (LASIX) 40 MG tablet TAKE 1 TABLET(40 MG) BY MOUTH DAILY  . hydrocortisone 2.5 % cream Apply to VULVA at bedtime as needed with miconazole for lichen planus  . ketoconazole (NIZORAL) 2 % cream Apply 1 application topically daily as needed (for crack bleeding fingers.).  [name] loratadine (CLARITIN) 10 MG tablet Take 10 mg by mouth daily.  [name]  meclizine (ANTIVERT) 25 MG tablet Take 25 mg by mouth 3 (three) times daily as needed for dizziness.  . miconazole (MICOTIN) 2 % cream Apply 1 application topically at bedtime as needed (with hydrocortisone for lichen planus).   .  miconazole (MICOTIN) 2 % powder Apply topically as needed for itching.  . Multiple Vitamins-Minerals (HAIR/SKIN/NAILS PO) Take 1 tablet by mouth daily.  . mupirocin ointment (BACTROBAN) 2 % Apply to affected area bid  . nitroGLYCERIN (NITROSTAT) 0.4 MG SL tablet Place 1 tablet (0.4 mg total) under the tongue every 5 (five) minutes as needed for chest pain.  . pantoprazole (PROTONIX) 40 MG tablet Take 1 tablet (40 mg total) by mouth 2 (two) times daily.  . polyethylene glycol powder (GLYCOLAX/MIRALAX) powder Take 8.5 g by mouth daily as needed.   . ramipril (ALTACE) 5 MG capsule TAKE 1 CAPSULE(5 MG) BY MOUTH DAILY  . REPATHA SURECLICK 161 MG/ML SOAJ INJECT 1 PEN INTO THE SKIN EVERY 14 DAYS  . spironolactone (ALDACTONE) 25 MG tablet TAKE 1 TABLET(25 MG) BY MOUTH EVERY MORNING  . traMADol (ULTRAM) 50 MG tablet TAKE 1 TABLET(50 MG) BY MOUTH TWICE DAILY AS NEEDED  . traZODone (DESYREL) 150 MG tablet TAKE 1 AND 2/3 TO 2 TABLETS BY MOUTH AT BEDTIME AS DIRECTED  . verapamil (CALAN-SR) 180 MG CR tablet TAKE 1 TABLET(180 MG) BY MOUTH TWICE DAILY  . vitamin B-12 (CYANOCOBALAMIN) 1000 MCG tablet Take 1,000 mcg by mouth daily.  . vitamin C (ASCORBIC ACID) 500 MG tablet Take 500 mg by mouth at bedtime.   No facility-administered encounter medications on file as of 05/26/2019.     Activities of Daily Living In your present state of health, do you have any difficulty performing the following activities: 05/26/2019  Hearing? N  Vision? N  Difficulty concentrating or making decisions? N  Walking or climbing stairs? Y  Comment Rollator walker in use when ambulating. Unsteady gait.  Dressing or bathing? N  Doing errands, shopping? Y  Comment She does not drive.  Preparing Food and eating ? N  Using the Toilet? N  In the past six months, have you accidently leaked urine? Y  Comment Pad worn at bedtime  Do you have problems with loss of bowel control? N  Managing your Medications? N  Managing your  Finances? N  Housekeeping or managing your Housekeeping? Y  Comment Maid assist  Some recent data might be hidden    Patient Care Team: [name], MD as PCP - General (Internal Medicine) Rockey Situ Kathlene November, MD as PCP - Cardiology (Cardiology) Wende Bushy, MD as Consulting Physician (Cardiology) Clent Jacks, RN as Registered Nurse    Assessment:   This is a routine wellness examination for [place].  I connected with patient 05/26/19 at  2:30 PM EDT by an audio enabled telemedicine application and verified that I am speaking with the correct person using two identifiers. Patient stated full name and DOB. Patient gave permission to continue with virtual visit. Patient's location was at home and Nurse's location was at [place] office.   Health Maintenance Due: Influenza vaccine 2020- discussed; to be completed in season with doctor or local pharmacy.   Dexa Scan- deferred/postponed per patient preference.  Update all pending maintenance due as appropriate.   See completed HM at the end of note.   Eye: Visual acuity not assessed. Virtual visit. Wears corrective lenses. Followed by their ophthalmologist every 12 months.   Dental: Visits every 6 months.    Hearing: Demonstrates  normal hearing during visit.  Safety:  Patient feels safe at home- yes Patient does have smoke detectors at home- yes Patient does wear sunscreen or protective clothing when in direct sunlight - yes Patient does wear seat belt when in a moving vehicle - yes Patient drives- no Adequate lighting in walkways free from debris- yes Grab bars and handrails used as appropriate- yes Ambulates with an assistive device- rollator walker Cell phone or lifeline/life alert/medic alert on person when ambulating outside of the home- yes, wears wrist pendent. Pull cords in the home.   Social: Alcohol intake - no       Smoking history- former   Smokers in home? none Illicit drug use? None Watches a movies  and drive through dinner weekly for socialism with friends. Covid -19 precautions in place.   Depression: PHQ 2 &9 complete. See screening below. Denies irritability, anhedonia, sadness/tearfullness.  Stable.   Falls: See screening below.  None in the last 12 months.   Medication: Taking as directed and without issues.   Covid-19: Precautions and sickness symptoms discussed. Wears mask, social distancing, hand hygiene as appropriate. [facility] care services with [facility] completes prescription pick up, grocery shopping and banking/atm deposit/withdrawals.   Activities of Daily Living Patient denies needing assistance with: feeding themselves, getting from bed to chair, getting to the toilet, bathing/showering, dressing, managing money, or preparing meals.  Assisted by maid assist with household chores.    Memory: Patient is alert. Patient denies difficulty focusing or concentrating. Correctly identified the president of the [place], season and recall. Patient likes to read, play complete jumble puzzles and sodoku for brain stimulation.  BMI- discussed the importance of a healthy diet, water intake and the benefits of aerobic exercise.  Educational material provided.  Physical activity- walking up/down hallway daily 20-25 minutes with rollator walker.   Bed exercises- no longer doing due to back spasms. Sleeps more comfortably in recliner.   Compression hose worn daily. Monitoring edema.   Diet: regular; appetite is good.  Water: good intake; just a little less than 64 ounces daily  Advanced Directive: End of life planning; Advance aging; Advanced directives discussed.  Copy of current HCPOA/Living Will requested.  Son is HCPOA.   Other Providers Patient Care Team: [name], MD as PCP - General (Internal Medicine) Minna Merritts, MD as PCP - Cardiology (Cardiology) Wende Bushy, MD as Consulting Physician (Cardiology) Clent Jacks, RN as Registered Nurse   Exercise Activities and Dietary recommendations Current Exercise Habits: Home exercise routine, Type of exercise: walking, Time (Minutes): 20, Intensity: Mild  Goals      Patient Stated   . Monitor sodium intake (pt-stated)       Fall Risk Fall Risk  05/26/2019 03/05/2018 03/11/2017 10/19/2016 09/06/2016  Falls in the past year? 0 No No No No  Number falls in past yr: - - - - -  Injury with Fall? - - - - -  Risk Factor Category  - - - - -  Risk for fall due to : - - - Impaired balance/gait -  Follow up - - - - -   Depression Screen PHQ 2/9 Scores 05/26/2019 03/05/2018 03/11/2017 11/19/2016  PHQ - 2 Score 0 0 1 1  PHQ- 9 Score - - 3 4     Cognitive Function MMSE - Mini Mental State Exam 03/11/2017 02/23/2016  Orientation to time 5 5  Orientation to Place 5 5  Registration 3 3  Attention/ Calculation 5 5  Recall 3 3  Language- name 2 objects 2 2  Language- repeat 1 1  Language- follow 3 step command 3 3  Language- read & follow direction 1 1  Write a sentence 1 1  Copy design 1 1  Total score 30 30     6CIT Screen 05/26/2019 03/18/2018  What Year? 0 points 0 points  What month? 0 points 0 points  What time? 0 points 0 points  Count back from 20 0 points 0 points  Months in reverse 0 points 0 points  Repeat phrase 0 points 0 points  Total Score 0 0    Immunization History  Administered Date(s) Administered  . Influenza, High Dose Seasonal PF 06/14/2016, 07/08/2018  . Influenza,inj,Quad PF,6+ Mos 06/15/2014  . Influenza-Unspecified 07/13/2015  . Pneumococcal Conjugate-13 01/13/2015  . Pneumococcal Polysaccharide-23 06/21/2009  . Tdap 02/22/2010  . Zoster 11/15/2010    Screening Tests Health Maintenance  Topic Date Due  . INFLUENZA VACCINE  05/09/2019  . DEXA SCAN  06/26/2019 (Originally 04/08/2003)  . MAMMOGRAM  12/18/2019  . TETANUS/TDAP  02/23/2020  . COLONOSCOPY  05/02/2020  . PNA vac Low Risk Adult  Completed        Plan:    Keep all routine maintenance  appointments. Follow up as needed.   Medicare Attestation I have personally reviewed: The patient's medical and social history Their use of alcohol, tobacco or illicit drugs Their current medications and supplements The patient's functional ability including ADLs,fall risks, home safety risks, cognitive, and hearing and visual impairment Diet and physical activities Evidence for depression   In addition, I have reviewed and discussed with patient certain preventive protocols, quality metrics, and best practice recommendations. A written personalized care plan for preventive services as well as general preventive health recommendations were provided to patient.     Varney Biles, LPN  9/48/0165   Reviewed above information.  Agree with assessment and plan.    Dr Nicki Reaper

## 2019-05-26 NOTE — Patient Instructions (Addendum)
  Charlotte Wagner , Thank you for taking time to come for your Medicare Wellness Visit. I appreciate your ongoing commitment to your health goals. Please review the following plan we discussed and let me know if I can assist you in the future.   These are the goals we discussed: Goals      Patient Stated   . Monitor sodium intake (pt-stated)       This is a list of the screening recommended for you and due dates:  Health Maintenance  Topic Date Due  . Flu Shot  05/09/2019  . DEXA scan (bone density measurement)  06/26/2019*  . Mammogram  12/18/2019  . Tetanus Vaccine  02/23/2020  . Colon Cancer Screening  05/02/2020  . Pneumonia vaccines  Completed  *Topic was postponed. The date shown is not the original due date.

## 2019-05-28 DIAGNOSIS — R2681 Unsteadiness on feet: Secondary | ICD-10-CM | POA: Diagnosis not present

## 2019-05-28 DIAGNOSIS — R531 Weakness: Secondary | ICD-10-CM | POA: Diagnosis not present

## 2019-06-02 DIAGNOSIS — R531 Weakness: Secondary | ICD-10-CM | POA: Diagnosis not present

## 2019-06-02 DIAGNOSIS — R2681 Unsteadiness on feet: Secondary | ICD-10-CM | POA: Diagnosis not present

## 2019-06-04 DIAGNOSIS — R2681 Unsteadiness on feet: Secondary | ICD-10-CM | POA: Diagnosis not present

## 2019-06-04 DIAGNOSIS — R531 Weakness: Secondary | ICD-10-CM | POA: Diagnosis not present

## 2019-06-09 ENCOUNTER — Telehealth: Payer: Self-pay | Admitting: Internal Medicine

## 2019-06-09 NOTE — Telephone Encounter (Signed)
Pt is requesting a Rx for Dulera-called in to her pharmacy. Pt says the directions are  2 puffs BID  Pt says that she also need Atracent  Directions  2 puffs 4-6 hours    Pharmacy:  [facility] [place], [place] [phone] (Phone) [phone] (Fax)

## 2019-06-10 ENCOUNTER — Other Ambulatory Visit: Payer: Self-pay

## 2019-06-10 MED ORDER — ATROVENT HFA 17 MCG/ACT IN AERS: INHALATION_SPRAY | RESPIRATORY_TRACT | 2 refills | Status: DC

## 2019-06-10 MED ORDER — FLUTICASONE-SALMETEROL 250-50 MCG/DOSE IN AEPB: 1.0000 | INHALATION_SPRAY | Freq: Two times a day (BID) | RESPIRATORY_TRACT | 0 refills | Status: DC

## 2019-06-10 NOTE — Telephone Encounter (Signed)
Charlotte Wagner was DC looks like 03/2018 , I refilled the atrovent, looks like she was started on advair?

## 2019-06-10 NOTE — Telephone Encounter (Signed)
Spoke with patient. She stated she got mixed up. Charlotte Wagner was d/c'd last year. I have sent in the advair for her. Patient is aware

## 2019-06-10 NOTE — Telephone Encounter (Signed)
Need to clarify what she is taking.  See phone note.

## 2019-06-10 NOTE — Telephone Encounter (Signed)
LMTCB

## 2019-06-11 ENCOUNTER — Other Ambulatory Visit: Payer: Self-pay | Admitting: Internal Medicine

## 2019-06-11 DIAGNOSIS — R531 Weakness: Secondary | ICD-10-CM | POA: Diagnosis not present

## 2019-06-11 DIAGNOSIS — R2681 Unsteadiness on feet: Secondary | ICD-10-CM | POA: Diagnosis not present

## 2019-06-12 NOTE — Telephone Encounter (Signed)
Pt called to check status. Please advise    Pt states that she would also like medication traMADol (ULTRAM) 50 MG tablet [id number]   Pt states that the pharmacy faxed this over    Cb#[phone]

## 2019-06-16 DIAGNOSIS — R2681 Unsteadiness on feet: Secondary | ICD-10-CM | POA: Diagnosis not present

## 2019-06-16 DIAGNOSIS — R531 Weakness: Secondary | ICD-10-CM | POA: Diagnosis not present

## 2019-06-18 ENCOUNTER — Telehealth: Payer: Self-pay | Admitting: Internal Medicine

## 2019-06-18 DIAGNOSIS — R2681 Unsteadiness on feet: Secondary | ICD-10-CM | POA: Diagnosis not present

## 2019-06-18 DIAGNOSIS — R531 Weakness: Secondary | ICD-10-CM | POA: Diagnosis not present

## 2019-06-18 MED ORDER — AMITRIPTYLINE HCL 25 MG PO TABS: ORAL_TABLET | ORAL | 2 refills | Status: DC

## 2019-06-18 MED ORDER — TRAMADOL HCL 50 MG PO TABS: ORAL_TABLET | ORAL | 0 refills | Status: DC

## 2019-06-18 NOTE — Telephone Encounter (Signed)
Medication Refill - Medication: amitriptyline (ELAVIL) 25 MG tablet  traMADol (ULTRAM) 50 MG tablet    Has the patient contacted their pharmacy? Yes.   (Agent: If no, request that the patient contact the pharmacy for the refill.) (Agent: If yes, when and what did the pharmacy advise?) fax sent to office with no response   Preferred Pharmacy (with phone number or street name):  [id number] - [place], [place] - [place] [phone] (Phone) [phone] (Fax)     Agent: Please be advised that RX refills may take up to 3 business days. We ask that you follow-up with your pharmacy.

## 2019-06-18 NOTE — Telephone Encounter (Signed)
Amitriptyline was filled by Dr Benjie Karvonen. Tramadol has not been filled since 01/05/19. Pt was just seen recently. OK to refill?

## 2019-06-18 NOTE — Telephone Encounter (Signed)
rx sent in for tramadol and amitriptyline.

## 2019-06-23 DIAGNOSIS — R2681 Unsteadiness on feet: Secondary | ICD-10-CM | POA: Diagnosis not present

## 2019-06-23 DIAGNOSIS — R531 Weakness: Secondary | ICD-10-CM | POA: Diagnosis not present

## 2019-06-25 DIAGNOSIS — R531 Weakness: Secondary | ICD-10-CM | POA: Diagnosis not present

## 2019-06-25 DIAGNOSIS — R2681 Unsteadiness on feet: Secondary | ICD-10-CM | POA: Diagnosis not present

## 2019-07-06 ENCOUNTER — Other Ambulatory Visit: Payer: Self-pay | Admitting: Cardiovascular Disease

## 2019-07-16 DIAGNOSIS — D0472 Carcinoma in situ of skin of left lower limb, including hip: Secondary | ICD-10-CM | POA: Diagnosis not present

## 2019-07-16 DIAGNOSIS — Z85820 Personal history of malignant melanoma of skin: Secondary | ICD-10-CM | POA: Diagnosis not present

## 2019-07-16 DIAGNOSIS — L821 Other seborrheic keratosis: Secondary | ICD-10-CM | POA: Diagnosis not present

## 2019-07-16 DIAGNOSIS — D485 Neoplasm of uncertain behavior of skin: Secondary | ICD-10-CM | POA: Diagnosis not present

## 2019-07-16 DIAGNOSIS — Z08 Encounter for follow-up examination after completed treatment for malignant neoplasm: Secondary | ICD-10-CM | POA: Diagnosis not present

## 2019-07-16 DIAGNOSIS — Z85828 Personal history of other malignant neoplasm of skin: Secondary | ICD-10-CM | POA: Diagnosis not present

## 2019-07-22 ENCOUNTER — Telehealth: Payer: Self-pay | Admitting: Internal Medicine

## 2019-07-22 NOTE — Telephone Encounter (Signed)
traZODone (DESYREL) 150 MG tablet   Patient requesting a refill but inquired if she can start taking 1/3 of this medication. Patient states she has spoken with pharmacy and they are supposed to call as well.

## 2019-07-23 MED ORDER — TRAZODONE HCL 150 MG PO TABS: ORAL_TABLET | ORAL | 1 refills | Status: DC

## 2019-07-23 NOTE — Telephone Encounter (Signed)
rx sent in for trazodone with new directions.

## 2019-07-23 NOTE — Telephone Encounter (Signed)
Called patient to clarify. She is wanting to know if we can change the directions on her trazodone to: 1 to 1 1/3 tablets q hs. She has not needed anymore than that in awhile.

## 2019-07-24 NOTE — Telephone Encounter (Signed)
Patient aware.

## 2019-08-03 ENCOUNTER — Other Ambulatory Visit: Payer: Self-pay | Admitting: Cardiovascular Disease

## 2019-08-05 ENCOUNTER — Other Ambulatory Visit: Payer: Self-pay | Admitting: Cardiovascular Disease

## 2019-08-06 ENCOUNTER — Other Ambulatory Visit: Payer: Self-pay | Admitting: Internal Medicine

## 2019-08-06 NOTE — Telephone Encounter (Signed)
Medication Refill - Medication: hydrocortisone 2.5 % cream   Has the patient contacted their pharmacy? No. (Agent: If no, request that the patient contact the pharmacy for the refill.) (Agent: If yes, when and what did the pharmacy advise?)  Preferred Pharmacy (with phone number or street name):  [id number] - [place], [place] - [place] [address]  [address] [place] [address]  Phone: [phone] Fax: [phone]  Not a 24 hour pharmacy; exact hours not known.     Agent: Please be advised that RX refills may take up to 3 business days. We ask that you follow-up with your pharmacy.

## 2019-08-06 NOTE — Telephone Encounter (Signed)
Requested medication (s) are due for refill today: yes  Requested medication (s) are on the active medication list: yes  Last refill: 03/23/18 historical provider  Future visit scheduled:yes  Notes to clinic: off protocol historic provider    Requested Prescriptions  Pending Prescriptions Disp Refills   hydrocortisone 2.5 % cream 28 g 0    Sig: Apply to VULVA at bedtime as needed with miconazole for lichen planus     Off-Protocol Failed - 08/06/2019  4:18 PM      Failed - Medication not assigned to a protocol, review manually.      Passed - Valid encounter within last 12 months    Recent Outpatient Visits          2 months ago Weakness   [place], Randell Patient, MD   8 months ago Routine general medical examination at a health care facility   [facility], Randell Patient, MD   1 year ago Hyperglycemia   [facility], Randell Patient, MD   1 year ago Anemia, unspecified type   [facility], MD   1 year ago Hyponatremia   [place], Randell Patient, MD      Future Appointments            In 9 months O'Brien-Blaney, Bryson [place]ona, LPN [place], [place]

## 2019-08-10 NOTE — Telephone Encounter (Signed)
hydrocortisone 2.5 % cream  Pt states that  Uses this daily and is wanting a refill, pls refill or if she can be called if not.  [id number] - [place], [place] [phone] (Phone) [phone] (Fax)

## 2019-08-11 NOTE — Telephone Encounter (Signed)
Last OV: 05/20/19

## 2019-08-12 MED ORDER — HYDROCORTISONE 2.5 % EX CREA: TOPICAL_CREAM | CUTANEOUS | 0 refills | Status: DC

## 2019-09-07 ENCOUNTER — Other Ambulatory Visit: Payer: Self-pay

## 2019-09-07 MED ORDER — REPATHA SURECLICK 140 MG/ML ~~LOC~~ SOAJ: 1.0000 "pen " | SUBCUTANEOUS | 0 refills | Status: DC

## 2019-09-18 ENCOUNTER — Other Ambulatory Visit: Payer: Self-pay

## 2019-09-18 ENCOUNTER — Telehealth: Payer: Self-pay | Admitting: Internal Medicine

## 2019-09-18 ENCOUNTER — Ambulatory Visit (INDEPENDENT_AMBULATORY_CARE_PROVIDER_SITE_OTHER): Payer: Medicare Other | Admitting: Internal Medicine

## 2019-09-18 ENCOUNTER — Telehealth: Payer: Self-pay | Admitting: *Deleted

## 2019-09-18 ENCOUNTER — Telehealth: Payer: Self-pay

## 2019-09-18 VITALS — Ht 63.0 in | Wt 191.0 lb

## 2019-09-18 DIAGNOSIS — R300 Dysuria: Secondary | ICD-10-CM | POA: Diagnosis not present

## 2019-09-18 MED ORDER — REPATHA SURECLICK 140 MG/ML ~~LOC~~ SOAJ: 1.0000 "pen " | SUBCUTANEOUS | 0 refills | Status: DC

## 2019-09-18 MED ORDER — CEFDINIR 300 MG PO CAPS: 300.0000 mg | ORAL_CAPSULE | Freq: Two times a day (BID) | ORAL | 0 refills | Status: DC

## 2019-09-18 MED ORDER — AMITRIPTYLINE HCL 25 MG PO TABS: ORAL_TABLET | ORAL | 5 refills | Status: DC

## 2019-09-18 NOTE — Telephone Encounter (Signed)
Copied from [place] [id number]. Topic: Quick Communication - Rx Refill/Question >> Sep 18, 2019 10:16 AM Leward Quan A wrote: Medication: amitriptyline (ELAVIL) 25 MG tablet    Has the patient contacted their pharmacy? Yes.   (Agent: If no, request that the patient contact the pharmacy for the refill.) (Agent: If yes, when and what did the pharmacy advise?)  Preferred Pharmacy (with phone number or street name): [id number] - [place], [place] - [address]  Phone:  [phone] Fax:  [phone]     Agent: Please be advised that RX refills may take up to 3 business days. We ask that you follow-up with your pharmacy.

## 2019-09-18 NOTE — Telephone Encounter (Signed)
Copied from [place] [id number]. Topic: General - Other >> Sep 18, 2019 10:06 AM Jodie Echevaria wrote: Reason for CRM: Patient called to let Dr Nicki Reaper know that she is having a bladder infection. Having urgency and bladder spasms. She states that she need an Rx called in to her pharmacy say that cefdinir (OMNICEF) 300 MG capsule was last used to treat her UTI and it worked. She does not want to wait on a urine culture in fear of going into urinary sepsis. Please call patient at Ph# [phone]

## 2019-09-18 NOTE — Telephone Encounter (Signed)
Called patient to get a little more info. Patient stated that she is having bladder spasms and urgency. No other sx noted. Asked if there was any way she could leave a urine sample and she says that she does not have a car and the facility is on lockdown due to [place]. Offered to schedule an appt with you at 4:30 and she said that was fine but if she waits that late she will not have anyone to go pick up her abx. She is concerned with sx getting worse over the weekend, she has became septic in the past.

## 2019-09-18 NOTE — Telephone Encounter (Signed)
Medication sent to pharmacy  

## 2019-09-18 NOTE — Telephone Encounter (Signed)
Urine was collected.  Will be sent out tomorrow for urinalysis and culture.  Pt was able to get her abx.  Has taken first dose and feeling better.  See note.

## 2019-09-18 NOTE — Progress Notes (Signed)
Patient ID: Charlotte Sessions, MD, female   DOB: Sep 06, 1936, 83 y.o.   MRN: [id number]   Virtual Visit via telephone Note This visit type was conducted due to national recommendations for restrictions regarding the COVID-19 pandemic (e.g. social distancing).  This format is felt to be most appropriate for this patient at this time.  All issues noted in this document were discussed and addressed.  No physical exam was performed (except for noted visual exam findings with Video Visits).   I connected with Charlotte Wagner by telephone and verified that I am speaking with the correct person using two identifiers. Location patient: home Location provider: work  Persons participating in the telephone visit: patient, provider  I discussed the limitations, risks, security and privacy concerns of performing an evaluation and management service by telephone and the availability of in person appointments. The patient expressed understanding and agreed to proceed.   Reason for visit: work in appt  HPI: She reports symptoms started 2-3 days ago.  Noticed dysuria.  Noticed spasm after urinating.  Some urgency and incontinence.  Last pm, noticed aching in her bladder.  No nausea or vomiting.  No diarrhea.  Eating and drinking.  No fever.  No hematuria.  Symptoms feel similar to previous urinary tract infections.  She was able to submit a urine sample, but because of covid restrictions, we are unable to send to the lab until tomorrow.  She has chronic constipation.  titrates miralax.  This works for her.  Still with sob, stable - over the last several months.    ROS: See pertinent positives and negatives per HPI.  Past Medical History:  Diagnosis Date  . Allergy   . Arthritis    s/p bilateral knees and left hip replacement  . Asthma   . CAD (coronary artery disease)    a. 12/1996 s/p CABG x4 ([place]);  b. 2005 Pt reports stress test & cath, which revealed patent grafts.  . Cancer ([place])    melanoma right  arm  . Carotid arterial disease ([place])    a. 04/2015 Carotid U/S: <50% bilat ICA stenosis.  . Chronic kidney disease   . Colon polyps    H/O  . Depression   . GERD (gastroesophageal reflux disease)    h/o hiatal hernia  . Headache    migraines in past  . Heart murmur    a. 04/2011 Echo: EF 55-60%, bilat atrial enlargement, mild to mod TR.  [name] History of chicken pox   . History of hiatal hernia   . Hx of migraines    rare now  . Hx: UTI (urinary tract infection)   . Hyperlipidemia    a. Statin intolerant -->on zetia.  . Hypertension   . Hypertensive heart disease   . Lichen planus   . Melanoma ([place])   . Palpitations    a. rare PVC's and h/o SVT.  [name] PMR (polymyalgia rheumatica) ([facility])    h/o in setting of crestor usage.  [name] PSVT (paroxysmal supraventricular tachycardia) ([place])   . PUD (peptic ulcer disease)    remote history  . Raynaud's phenomenon   . Spinal stenosis   . Urine incontinence    H/O  . Vitamin D deficiency     Past Surgical History:  Procedure Laterality Date  . ADENOIDECTOMY     age 22  . BACK SURGERY     L3-L5  . BILATERAL CARPAL TUNNEL RELEASE    . BREAST BIOPSY Right    bx x  3 neg  . BREAST SURGERY Right    biopsy x 3 (all benign)  . CARDIAC CATHETERIZATION N/A 06/01/2016   Procedure: LEFT HEART CATH AND CORS/GRAFTS ANGIOGRAPHY;  Surgeon: Wellington Hampshire, MD;  Location: [facility];  Service: Cardiovascular;  Laterality: N/A;  . CARDIAC CATHETERIZATION N/A 06/01/2016   Procedure: Coronary Stent Intervention;  Surgeon: Wellington Hampshire, MD;  Location: [place];  Service: Cardiovascular;  Laterality: N/A;  . CARDIOVERSION N/A 03/08/2017   Procedure: CARDIOVERSION;  Surgeon: Wellington Hampshire, MD;  Location: [facility];  Service: Cardiovascular;  Laterality: N/A;  . CATARACT EXTRACTION W/PHACO Right 01/04/2016   Procedure: CATARACT EXTRACTION PHACO AND INTRAOCULAR LENS PLACEMENT ([place]);  Surgeon: Leandrew Koyanagi, MD;  Location: [facility];  Service: Ophthalmology;  Laterality: Right;  MALYUGIN  . CATARACT EXTRACTION W/PHACO Left 01/25/2016   Procedure: CATARACT EXTRACTION PHACO AND INTRAOCULAR LENS PLACEMENT ([place]) left eye;  Surgeon: Leandrew Koyanagi, MD;  Location: [facility];  Service: Ophthalmology;  Laterality: Left;  MALYUGIN SHUGARCAINE  . CHOLECYSTECTOMY  90's  . COLONOSCOPY WITH PROPOFOL N/A 05/03/2015   Procedure: COLONOSCOPY WITH PROPOFOL;  Surgeon: Lollie Sails, MD;  Location[facility];  Service: Endoscopy;  Laterality: N/A;  . CORONARY ARTERY BYPASS GRAFT  98   4 vessel  . CORONARY STENT INTERVENTION N/A 03/31/2018   Procedure: CORONARY STENT INTERVENTION;  Surgeon: Wellington Hampshire, MD;  Location: [place] [facility];  Service: Cardiovascular;  Laterality: N/A;  . ESOPHAGOGASTRODUODENOSCOPY N/A 03/22/2015   Procedure: ESOPHAGOGASTRODUODENOSCOPY (EGD);  Surgeon: Lollie Sails, MD;  Location: [facility];  Service: Endoscopy;  Laterality: N/A;  . HEMORRHOID BANDING    . HEMORRHOID SURGERY N/A 02/17/2018   Procedure: HEMORRHOIDECTOMY;  Surgeon: Robert Bellow, MD;  Location: [facility];  Service: General;  Laterality: N/A;  . JOINT REPLACEMENT     BILATERAL KNEE REPLACEMENTS  . KNEE ARTHROSCOPY W/ OATS PROCEDURE     Lt knee (9/01), Rt knee (3/11), Lt hip (5/10)  . LEFT HEART CATH AND CORONARY ANGIOGRAPHY N/A 03/31/2018   Procedure: LEFT HEART CATH AND CORONARY ANGIOGRAPHY;  Surgeon: Wellington Hampshire, MD;  Location: [place] [facility];  Service: Cardiovascular;  Laterality: N/A;  . TOTAL HIP ARTHROPLASTY      Family History  Problem Relation Age of Onset  . Thyroid disease Mother        graves disease  . Heart disease Father        rheumatic heart  . Alcohol abuse Father   . Depression Father   . Lung cancer Sister   . Depression Daughter   . Thyroid disease Daughter        hashimoto  . Depression Son   . Stroke Maternal Grandmother   . Depression Maternal  Grandmother   . Hypertension Maternal Grandfather   . Hypertension Paternal Grandfather   . Seizures Son   . Breast cancer Neg Hx     SOCIAL HX: reviewed.    Current Outpatient Medications:  .  acetaminophen (TYLENOL) 500 MG tablet, Take 1,000 mg by mouth 2 (two) times daily. , Disp: , Rfl:  .  amitriptyline (ELAVIL) 25 MG tablet, Take 25 mg by mouth at bedtime, Disp: 30 tablet, Rfl: 5 .  b complex vitamins tablet, Take 1 tablet by mouth daily., Disp: , Rfl:  .  cefdinir (OMNICEF) 300 MG capsule, Take 1 capsule (300 mg total) by mouth 2 (two) times daily., Disp: 10 capsule, Rfl: 0 .  Cholecalciferol (VITAMIN D3) 2000 UNITS TABS, Take 2,000 Units by mouth 2 (two) times a week. , Disp: , Rfl:  .  clopidogrel (PLAVIX) 75 MG tablet, TAKE 1 TABLET(75 MG) BY MOUTH DAILY WITH BREAKFAST, Disp: 90 tablet, Rfl: 3 .  Coenzyme Q10 (COQ-10) 200 MG CAPS, Take 200 mg by mouth daily. , Disp: , Rfl:  .  conjugated estrogens (PREMARIN) vaginal cream, Apply 0.5mg  (pea-sized amount)  just inside the vaginal introitus with a finger-tip on  Monday, Wednesday and Friday nights., Disp: 30 g, Rfl: 12 .  CRANBERRY PO, Take 1 capsule by mouth daily., Disp: , Rfl:  .  ELIQUIS 5 MG TABS tablet, TAKE 1 TABLET(5 MG) BY MOUTH TWICE DAILY, Disp: 180 tablet, Rfl: 1 .  Evolocumab (REPATHA SURECLICK) XX123456 MG/ML SOAJ, Inject 1 pen as directed every 14 (fourteen) days., Disp: 2 mL, Rfl: 0 .  ezetimibe (ZETIA) 10 MG tablet, TAKE 1 TABLET(10 MG) BY MOUTH DAILY, Disp: 90 tablet, Rfl: 0 .  Fluticasone-Salmeterol (ADVAIR DISKUS) 250-50 MCG/DOSE AEPB, Inhale 1 puff into the lungs 2 (two) times daily., Disp: 14 each, Rfl: 0 .  folic acid (FOLVITE) 1 MG tablet, TAKE 1 TABLET(1 MG) BY MOUTH DAILY, Disp: 30 tablet, Rfl: 0 .  furosemide (LASIX) 40 MG tablet, TAKE 1 TABLET(40 MG) BY MOUTH DAILY, Disp: 90 tablet, Rfl: 3 .  hydrocortisone 2.5 % cream, Apply to VULVA at bedtime as needed with miconazole for lichen planus, Disp: 28 g, Rfl: 0 .   ipratropium (ATROVENT HFA) 17 MCG/ACT inhaler, inhale 2 puffs INTO THE LUNGS every 6 hours if needed for wheezing, Disp: 12.9 g, Rfl: 2 .  ketoconazole (NIZORAL) 2 % cream, Apply 1 application topically daily as needed (for crack bleeding fingers.)., Disp: 15 g, Rfl: 0 .  loratadine (CLARITIN) 10 MG tablet, Take 10 mg by mouth daily., Disp: , Rfl:  .  meclizine (ANTIVERT) 25 MG tablet, Take 25 mg by mouth 3 (three) times daily as needed for dizziness., Disp: , Rfl:  .  miconazole (MICOTIN) 2 % cream, Apply 1 application topically at bedtime as needed (with hydrocortisone for lichen planus). , Disp: , Rfl:  .  miconazole (MICOTIN) 2 % powder, Apply topically as needed for itching., Disp: 70 g, Rfl: 0 .  Multiple Vitamins-Minerals (HAIR/SKIN/NAILS PO), Take 1 tablet by mouth daily., Disp: , Rfl:  .  mupirocin ointment (BACTROBAN) 2 %, Apply to affected area bid, Disp: 22 g, Rfl: 0 .  nitroGLYCERIN (NITROSTAT) 0.4 MG SL tablet, Place 1 tablet (0.4 mg total) under the tongue every 5 (five) minutes as needed for chest pain., Disp: 25 tablet, Rfl: 6 .  pantoprazole (PROTONIX) 40 MG tablet, TAKE 1 TABLET(40 MG) BY MOUTH TWICE DAILY, Disp: 180 tablet, Rfl: 2 .  polyethylene glycol powder (GLYCOLAX/MIRALAX) powder, Take 8.5 g by mouth daily as needed. , Disp: , Rfl:  .  PREVIDENT 5000 BOOSTER PLUS 1.1 % PSTE, USE TID AFTER MEALS . DO NOT RINSE, Disp: , Rfl:  .  ramipril (ALTACE) 5 MG capsule, TAKE 1 CAPSULE(5 MG) BY MOUTH DAILY, Disp: 90 capsule, Rfl: 1 .  spironolactone (ALDACTONE) 25 MG tablet, TAKE 1 TABLET(25 MG) BY MOUTH EVERY MORNING, Disp: 90 tablet, Rfl: 3 .  traMADol (ULTRAM) 50 MG tablet, TAKE 1 TABLET(50 MG) BY MOUTH TWICE DAILY AS NEEDED, Disp: 30 tablet, Rfl: 0 .  traZODone (DESYREL) 150 MG tablet, Take 1 tablet to  1and 1/3 tablets q hs, Disp: 90 tablet, Rfl: 1 .  verapamil (CALAN-SR) 180  MG CR tablet, TAKE 1 TABLET(180 MG) BY MOUTH TWICE DAILY, Disp: 180 tablet, Rfl: 3 .  vitamin B-12  (CYANOCOBALAMIN) 1000 MCG tablet, Take 1,000 mcg by mouth daily., Disp: , Rfl:  .  vitamin C (ASCORBIC ACID) 500 MG tablet, Take 500 mg by mouth at bedtime., Disp: , Rfl:   EXAM:  GENERAL: alert.  Sounds to be in no acute distress.  Answering questions appropriately.    PSYCH/NEURO: pleasant and cooperative, no obvious depression or anxiety, speech and thought processing grossly intact  ASSESSMENT AND PLAN:  Discussed the following assessment and plan:  Dysuria Symptoms c/w UTI.  She was able to submit a urine sample, but not able to send for testing until tomorrow.  Given her history of previous hospitalization with sepsis from UTI and given symptoms characteristic of UTI, will go ahead and treat with omnicef as directed.  Follow up with urine culture.  Stay hydrated.  Keep me posted on how she is doing.      I discussed the assessment and treatment plan with the patient. The patient was provided an opportunity to ask questions and all were answered. The patient agreed with the plan and demonstrated an understanding of the instructions.   The patient was advised to call back or seek an in-person evaluation if the symptoms worsen or if the condition fails to improve as anticipated.  I provided 12 minutes of non-face-to-face time during this encounter.   [name], MD

## 2019-09-18 NOTE — Telephone Encounter (Signed)
Requested Prescriptions   Signed Prescriptions Disp Refills  . Evolocumab (REPATHA SURECLICK) [id number] MG/ML SOAJ 2 mL 0    Sig: Inject 1 pen as directed every 14 (fourteen) days.    Authorizing Provider: Minna Merritts    Ordering User: Raelene Bott, BRANDY L

## 2019-09-18 NOTE — Telephone Encounter (Signed)
Discussed with you. I have given verbals to Crystal at [facility] to send urine for a UA & culture/sensitivity. Urine will be sent out on Monday AM. Gave fax # for them to send results. Patient is aware. Abx was sent in to pharmacy requested so she could have someone pick it up for her. Pt scheduled for virtual today at 4:30.

## 2019-09-20 ENCOUNTER — Encounter: Payer: Self-pay | Admitting: Internal Medicine

## 2019-09-20 NOTE — Assessment & Plan Note (Signed)
Symptoms c/w UTI.  She was able to submit a urine sample, but not able to send for testing until tomorrow.  Given her history of previous hospitalization with sepsis from UTI and given symptoms characteristic of UTI, will go ahead and treat with omnicef as directed.  Follow up with urine culture.  Stay hydrated.  Keep me posted on how she is doing.

## 2019-09-23 ENCOUNTER — Telehealth: Payer: Self-pay

## 2019-09-23 NOTE — Telephone Encounter (Signed)
Called patient. Confirmed doing ok. Symptoms have resolved.

## 2019-09-23 NOTE — Telephone Encounter (Signed)
U/A received but no results of culture yet. Urine was clear. Placed in result folder for your review

## 2019-09-23 NOTE — Telephone Encounter (Signed)
Please notify pt urine clear.  No culture performed.  Please confirm doing ok.

## 2019-09-24 ENCOUNTER — Other Ambulatory Visit: Payer: Self-pay | Admitting: Internal Medicine

## 2019-09-24 MED ORDER — TRAMADOL HCL 50 MG PO TABS: ORAL_TABLET | ORAL | 0 refills | Status: DC

## 2019-09-24 NOTE — Telephone Encounter (Signed)
Pt is out of Tramadol 50mg  and needs a refill sent to [facility]

## 2019-09-24 NOTE — Telephone Encounter (Signed)
Last OV 09/18/2019 Next OV none scheduled.  Last refill 06/18/2019

## 2019-09-24 NOTE — Telephone Encounter (Signed)
rx ok'd for tramadol #30 with no refills.  

## 2019-10-11 ENCOUNTER — Other Ambulatory Visit: Payer: Self-pay | Admitting: Cardiovascular Disease

## 2019-10-12 NOTE — Telephone Encounter (Signed)
Pt's age 84, wt 86.6 kg, SCr 0.96, CrCl 62.83, last ov w/ TG 02/24/19.

## 2019-10-12 NOTE — Telephone Encounter (Signed)
Refill Request.  

## 2019-10-12 NOTE — Progress Notes (Signed)
Virtual Visit via Telephone Note   This visit type was conducted due to national recommendations for restrictions regarding the COVID-19 Pandemic (e.g. social distancing) in an effort to limit this patient's exposure and mitigate transmission in our community.  Due to her co-morbid illnesses, this patient is at least at moderate risk for complications without adequate follow up.  This format is felt to be most appropriate for this patient at this time.  The patient did not have access to video technology/had technical difficulties with video requiring transitioning to audio format only (telephone).  All issues noted in this document were discussed and addressed.  No physical exam could be performed with this format.  Please refer to the patient's chart for her  consent to telehealth for [facility].   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 fib accompanied by bradycardia status post unsuccessful DCCV with beta-blocker intolerance diagnosed in 10/2016, pulmonary hypertension, HLD with statin intolerance on Repatha, HTN, along with multiple other comorbidities as outlined below in the [place] who presents for follow-up of her CAD, HFrEF, and A. Fib.  Patient was previously followed by Dr. Yvone Neu though has subsequently established with Dr. Rockey Situ.  LHC in 05/2014 showed severe underlying three-vessel disease with patent grafts including LIMA to LAD, SVG to diagonal, SVG to LCx, and SVG to RCA.  There was  severe 90% stenosis in the SVG to RCA which was felt to be the culprit for the patient's unstable angina.  She underwent successful PCI/DES to the SVG to RCA using distal protection device.  Echo at that time showed an EF of 60 to 65%, no RWMA, mild MR, normal RV systolic function, PASP 40 mmHg.  During admission in 10/2016 for newly diagnosed A. Fib, echo showed an EF of 60 to 65%, normal wall motion, mild MR, moderately dilated LA, mildly dilated RV cavity size with mildly reduced RV systolic function, PASP 49 mmHg.  Rhythm was A. fib.  Following this, she underwent briefly successful DCCV in 03/2017, redeveloping A. fib several days later.  She was subsequently admitted in 03/2017 with transient global amnesia with work-up being unrevealing including EEG, CT head, MRI brain and carotid artery ultrasound demonstrating moderate atherosclerotic plaque bilaterally and normal antegrade flow within the bilateral vertebral arteries.  Most recent LHC from 03/2018 showed significant underlying three-vessel CAD with patent grafts including LIMA to LAD, SVG to RCA, SVG to OM, and SVG to diagonal.  The stent in the SVG to RCA was patent.  There was severe new stenosis in the SVG to diagonal which was felt to be the culprit lesion.  She underwent successful direct stenting of the SVG to diagonal using distal protection device.  Echo at that time showed a new cardiomyopathy with an EF of 30 to 35%, hypokinesis of the anteroseptal, anterior, and apical myocardium with basal wall motion best preserved, mild MR, mildly to moderately dilated left atrium, moderately dilated RV cavity size with moderately  reduced RV systolic function, mildly dilated right atrium, PFO was noted, moderate to severe TR, PASP 58 mmHg, rhythm of A. Fib.  She was last seen by her primary cardiologist virtually in 02/2019 noting poor stamina though continued to do her regular activities.  She was ambulating with a walker on a regular basis.  She denied any  falls.  Vital signs were stable.  She noted some lower extremity swelling in the evenings that was typically better in the morning.  Patient feels about the same today as when she was last seen virtually, just slightly more deconditioned.  She has essentially not left her apartment since the COVID-19 lockdown began in early 2020.  She does ambulate approximately 2/10 of a mile on almost a daily basis.  Her main limiting factor in this is bilateral lower extremity pain secondary to known spinal stenosis.  She has had her groceries delivered to her in the setting of the COVID-19 pandemic and historically has been eating a diet consisting mostly of lean cuisine frozen meals.  However, she is transitioning over to healthy choice frozen meals in an effort to decrease her sodium intake.  She has noted an increase in her weight over the past 12 months gradually and attributes this to significantly reduced activities leading to further reduction in functional status and increase caloric intake.  She now indicates her baseline weight is approximately 190 to 193 pounds.  She notes when her weight will go up 2 or 3 pounds overnight she will be certain to have a lower sodium load the following day.  She has intermittently taken an extra half dose of Lasix over the past 12 months.  She is tolerating Eliquis and Plavix without issues.  She has not had any falls.  No BRBPR or melena.  She sleeps in a recliner at baseline secondary to lumbar spinal stenosis.  She notes she is more deconditioned though states things are overall stable and prefers to avoid coming on for in person visits at this time.  She has auscultated her lungs herself and indicates they are clear to auscultation bilaterally.   Labs independently reviewed: 11/2018 - albumin 4.5, AST/ALT normal, TC 142, TG 76, HDL 73, LDL 53, potassium 4.2, BUN 19, serum creatinine 0.96, Hgb 13.9, PLT 220, A1c 5.6 03/2018 - TSH normal  The patient does not have symptoms  concerning for COVID-19 infection (fever, chills, cough, or new shortness of breath).    Past Medical History:  Diagnosis Date  . Allergy   . Arthritis    s/p bilateral knees and left hip replacement  . Asthma   . CAD (coronary artery disease)    a. 12/1996 s/p CABG x4 ([place]);  b. 2005 Pt reports stress test & cath, which revealed patent grafts.  . Cancer ([place])    melanoma right arm  . Carotid arterial disease ([place])    a. 04/2015 Carotid U/S: <50% bilat ICA stenosis.  . Chronic kidney disease   . Colon polyps    H/O  . Depression   . GERD (gastroesophageal reflux disease)    h/o hiatal hernia  . Headache    migraines in past  . Heart murmur    a. 04/2011 Echo: EF 55-60%, bilat atrial enlargement, mild to mod TR.  [name] History of chicken pox   . History of hiatal hernia   . Hx of migraines    rare now  . Hx: UTI (urinary tract infection)   . Hyperlipidemia    a.  Statin intolerant -->on zetia.  . Hypertension   . Hypertensive heart disease   . Lichen planus   . Melanoma ([place])   . Palpitations    a. rare PVC's and h/o SVT.  [name] PMR (polymyalgia rheumatica) (HCC)    h/o in setting of crestor usage.  [name] PSVT (paroxysmal supraventricular tachycardia) ([place])   . PUD (peptic ulcer disease)    remote history  . Raynaud's phenomenon   . Spinal stenosis   . Urine incontinence    H/O  . Vitamin D deficiency    Past Surgical History:  Procedure Laterality Date  . ADENOIDECTOMY     age 51  . BACK SURGERY     L3-L5  . BILATERAL CARPAL TUNNEL RELEASE    . BREAST BIOPSY Right    bx x 3 neg  . BREAST SURGERY Right    biopsy x 3 (all benign)  . CARDIAC CATHETERIZATION N/A 06/01/2016   Procedure: LEFT HEART CATH AND CORS/GRAFTS ANGIOGRAPHY;  Surgeon: Wellington Hampshire, MD;  Location: [place] [facility];  Service: Cardiovascular;  Laterality: N/A;  . CARDIAC CATHETERIZATION N/A 06/01/2016   Procedure: Coronary Stent Intervention;  Surgeon: Wellington Hampshire, MD;  Location: [place] [facility];  Service: Cardiovascular;  Laterality: N/A;  . CARDIOVERSION N/A 03/08/2017   Procedure: CARDIOVERSION;  Surgeon: Wellington Hampshire, MD;  Location: [facility];  Service: Cardiovascular;  Laterality: N/A;  . CATARACT EXTRACTION W/PHACO Right 01/04/2016   Procedure: CATARACT EXTRACTION PHACO AND INTRAOCULAR LENS PLACEMENT ([place]);  Surgeon: Leandrew Koyanagi, MD;  Location: [place];  Service: Ophthalmology;  Laterality: Right;  MALYUGIN  . CATARACT EXTRACTION W/PHACO Left 01/25/2016   Procedure: CATARACT EXTRACTION PHACO AND INTRAOCULAR LENS PLACEMENT ([place]) left eye;  Surgeon: Leandrew Koyanagi, MD;  Location: [place];  Service: Ophthalmology;  Laterality: Left;  MALYUGIN SHUGARCAINE  . CHOLECYSTECTOMY  90's  . COLONOSCOPY WITH PROPOFOL N/A 05/03/2015   Procedure: COLONOSCOPY WITH PROPOFOL;  Surgeon: Lollie Sails, MD;  Location: [facility];  Service: Endoscopy;  Laterality: N/A;  . CORONARY ARTERY BYPASS GRAFT  98   4 vessel  . CORONARY STENT INTERVENTION N/A 03/31/2018   Procedure: CORONARY STENT INTERVENTION;  Surgeon: Wellington Hampshire, MD;  Location: [place] [facility];  Service: Cardiovascular;  Laterality: N/A;  . ESOPHAGOGASTRODUODENOSCOPY N/A 03/22/2015   Procedure: ESOPHAGOGASTRODUODENOSCOPY (EGD);  Surgeon: Lollie Sails, MD;  Location: [facility];  Service: Endoscopy;  Laterality: N/A;  . HEMORRHOID BANDING    . HEMORRHOID SURGERY N/A 02/17/2018   Procedure: HEMORRHOIDECTOMY;  Surgeon: Robert Bellow, MD;  Location: [facility];  Service: General;  Laterality: N/A;  . JOINT REPLACEMENT     BILATERAL KNEE REPLACEMENTS  . KNEE ARTHROSCOPY W/ OATS PROCEDURE     Lt knee (9/01), Rt knee (3/11), Lt hip (5/10)  . LEFT HEART CATH AND CORONARY ANGIOGRAPHY N/A 03/31/2018   Procedure: LEFT HEART CATH AND CORONARY ANGIOGRAPHY;  Surgeon: Wellington Hampshire, MD;  Location: [place] [facility];  Service: Cardiovascular;  Laterality: N/A;  . TOTAL  HIP ARTHROPLASTY       Current Meds  Medication Sig  . acetaminophen (TYLENOL) 500 MG tablet Take 1,000 mg by mouth 2 (two) times daily.   [name] amitriptyline (ELAVIL) 25 MG tablet Take 25 mg by mouth at bedtime  . b complex vitamins tablet Take 1 tablet by mouth daily.  . Cholecalciferol (VITAMIN D3) 2000 UNITS TABS Take 2,000 Units by mouth 2 (two) times a week.   [name]  clopidogrel (PLAVIX) 75 MG tablet TAKE 1 TABLET(75 MG) BY MOUTH DAILY WITH BREAKFAST  . Coenzyme Q10 (COQ-10) 200 MG CAPS Take 200 mg by mouth daily.   [name] conjugated estrogens (PREMARIN) vaginal cream Apply 0.5mg  (pea-sized amount)  just inside the vaginal introitus with a finger-tip on  Monday, Wednesday and Friday nights. (Patient taking differently: as needed. Apply 0.5mg  (pea-sized amount)  just inside the vaginal introitus with a finger-tip on  Monday, Wednesday and Friday nights.)  . CRANBERRY PO Take 1 capsule by mouth daily.  [name] ELIQUIS 5 MG TABS tablet TAKE 1 TABLET(5 MG) BY MOUTH TWICE DAILY  . Evolocumab (REPATHA SURECLICK) [id number] MG/ML SOAJ Inject 1 pen as directed every 14 (fourteen) days.  [name] ezetimibe (ZETIA) 10 MG tablet TAKE 1 TABLET(10 MG) BY MOUTH DAILY  . folic acid (FOLVITE) 1 MG tablet TAKE 1 TABLET(1 MG) BY MOUTH DAILY  . furosemide (LASIX) 40 MG tablet TAKE 1 TABLET(40 MG) BY MOUTH DAILY  . hydrocortisone 2.5 % cream Apply to VULVA at bedtime as needed with miconazole for lichen planus  . ipratropium (ATROVENT HFA) 17 MCG/ACT inhaler inhale 2 puffs INTO THE LUNGS every 6 hours if needed for wheezing  . ketoconazole (NIZORAL) 2 % cream Apply 1 application topically daily as needed (for crack bleeding fingers.).  [name] loratadine (CLARITIN) 10 MG tablet Take 10 mg by mouth daily.  . meclizine (ANTIVERT) 25 MG tablet Take 25 mg by mouth 3 (three) times daily as needed for dizziness.  . miconazole (MICOTIN) 2 % cream Apply 1 application topically at bedtime as needed (with hydrocortisone for lichen planus).   . miconazole  (MICOTIN) 2 % powder Apply topically as needed for itching.  . Multiple Vitamins-Minerals (HAIR/SKIN/NAILS PO) Take 1 tablet by mouth daily.  . mupirocin ointment (BACTROBAN) 2 % Apply to affected area bid (Patient taking differently: Apply to affected area bid as needed)  . nitroGLYCERIN (NITROSTAT) 0.4 MG SL tablet Place 1 tablet (0.4 mg total) under the tongue every 5 (five) minutes as needed for chest pain.  . pantoprazole (PROTONIX) 40 MG tablet TAKE 1 TABLET(40 MG) BY MOUTH TWICE DAILY  . polyethylene glycol powder (GLYCOLAX/MIRALAX) powder Take 8.5 g by mouth daily as needed.   [name] PREVIDENT 5000 BOOSTER PLUS 1.1 % PSTE USE TID AFTER MEALS . DO NOT RINSE  . ramipril (ALTACE) 5 MG capsule TAKE 1 CAPSULE(5 MG) BY MOUTH DAILY  . spironolactone (ALDACTONE) 25 MG tablet TAKE 1 TABLET(25 MG) BY MOUTH EVERY MORNING  . traMADol (ULTRAM) 50 MG tablet TAKE 1 TABLET(50 MG) BY MOUTH TWICE DAILY AS NEEDED  . traZODone (DESYREL) 150 MG tablet Take 1 tablet to  1and 1/3 tablets q hs  . verapamil (CALAN-SR) 180 MG CR tablet TAKE 1 TABLET(180 MG) BY MOUTH TWICE DAILY  . vitamin B-12 (CYANOCOBALAMIN) 1000 MCG tablet Take 1,000 mcg by mouth daily.  . vitamin C (ASCORBIC ACID) 500 MG tablet Take 500 mg by mouth at bedtime.     Allergies:   Beta adrenergic blockers; Brilinta [ticagrelor]; Albuterol; Antihistamines, diphenhydramine-type; Aspirin; Nsaids; Penicillins; Statins; Sulfa antibiotics; Sulfasalazine; Tetracycline; and Tetracyclines & related   Social History   Tobacco Use  . Smoking status: Former Research scientist (life sciences)  . Smokeless tobacco: Never Used  . Tobacco comment: quit 47+ yrs ago  Substance Use Topics  . Alcohol use: No    Alcohol/week: 0.0 standard drinks  . Drug use: No     Family Hx: The patient's family history includes Alcohol abuse in her father;  Depression in her daughter, father, maternal grandmother, and son; Heart disease in her father; Hypertension in her maternal grandfather and paternal  grandfather; Lung cancer in her sister; Seizures in her son; Stroke in her maternal grandmother; Thyroid disease in her daughter and mother. There is no history of Breast cancer.  ROS:   Please see the history of present illness.     All other systems reviewed and are negative.   Prior CV studies:   The following studies were reviewed today:  LHC 03/2018:  Ost LM to Mid LM lesion is 80% stenosed.  Ost LAD to Prox LAD lesion is 100% stenosed.  Prox Cx lesion is 80% stenosed.  Prox RCA to Mid RCA lesion is 80% stenosed.  Mid RCA to Dist RCA lesion is 100% stenosed.  Prox Graft lesion is 30% stenosed.  Post Atrio lesion is 70% stenosed.  Prox Graft lesion is 95% stenosed.  Origin lesion is 30% stenosed.  LIMA and is normal in caliber.  The graft exhibits no disease.  Post intervention, there is a 0% residual stenosis.  A drug-eluting stent was successfully placed using a STENT RESOLUTE ONYX 4.0X15.   1.  Significant underlying three-vessel coronary artery disease with patent grafts including LIMA to LAD, SVG to RCA, SVG to OM and SVG to diagonal.  Patent stent in the SVG to RCA.  Severe new stenosis and SVG to diagonal is likely the culprit. 2.  Left ventricular angiography was not performed. 3.  High normal left ventricular end-diastolic pressure at 12 mmHg. 4.  Successful direct stenting of SVG to diagonal using a distal protection device.  Recommendations: Eliquis can be resumed tomorrow if no bleeding complications.  Use Plavix monotherapy without aspirin for 1 year. The patient appears to be euvolemic based on LVEDP.  Continue oral furosemide. __________  2D echo 03/2018: - Left ventricle: The cavity size was normal. Systolic function was   moderately to severely reduced. The estimated ejection fraction   was in the range of 30% to 35%. Hypokinesis of the anteroseptal   myocardium. Hypokinesis of the anterior myocardium. Basal wall   motion best preserved.  Hypokinesis of the apical myocardium. - Mitral valve: There was mild regurgitation. - Left atrium: The atrium was mildly to moderately dilated. - Right ventricle: The cavity size was moderately dilated. Wall   thickness was normal. Systolic function was moderately to   severely reduced. - Right atrium: The atrium was mildly dilated. - Atrial septum: There was a patent foramen ovale. - Tricuspid valve: There was moderate-severe regurgitation. - Pulmonary arteries: Systolic pressure was moderate to severely   elevated PA peak pressure: 58 mm Hg (S).  Impressions:  - rhythm is atrial fibrillation.   Labs/Other Tests and Data Reviewed:    EKG:  No ECG reviewed.  Recent Labs: 12/02/2018: ALT 22; BUN 19; Creatinine, Ser 0.96; Hemoglobin 13.9; Platelets 228.0; Potassium 4.2; Sodium 134   Recent Lipid Panel Lab Results  Component Value Date/Time   CHOL 142 12/02/2018 02:50 PM   TRIG 76.0 12/02/2018 02:50 PM   HDL 73.70 12/02/2018 02:50 PM   CHOLHDL 2 12/02/2018 02:50 PM   LDLCALC 53 12/02/2018 02:50 PM   LDLDIRECT 126.7 07/03/2013 09:37 AM    Wt Readings from Last 3 Encounters:  10/15/19 193 lb (87.5 kg)  09/18/19 191 lb (86.6 kg)  11/11/18 180 lb 12.8 oz (82 kg)     Objective:    Vital Signs:  BP 124/64 (BP Location: Left Arm, Patient Position: Sitting)  Pulse 72   Ht 5\' 3"  (1.6 m)   Wt 193 lb (87.5 kg)   BMI 34.19 kg/m    VITAL SIGNS:  reviewed  ASSESSMENT & PLAN:    1. CAD status post CABG status post subsequent PCI without angina: She denies any symptoms concerning for angina.  She remains on Plavix given prior bypass with subsequent PCIs along with the vein grafts as outlined above.  No symptoms concerning for bleeding.  Intolerant to beta-blocker and high intensity statin as outlined below.  Continue PCSK9 inhibitor and Zetia.  No plans for ischemic evaluation at this time.  2. Chronic combined systolic and diastolic CHF/pulmonary hypertension: She denies any  symptoms of decompensation and appears to be euvolemic based on symptoms and history.  She is eating a diet high in sodium though is planning to transition from lean cuisine frozen meals to healthy choice frozen meals.  She does note an increase in her weight gradually over the past 12 months though attributes this to sedentary lifestyle, physical deconditioning, and increased caloric intake.  She indicates she is at her approximate noon dry weight and will decrease her sodium load the following day if she notes significant weight gain overnight.  She has no longer on beta-blocker therapy secondary to hypotension and fatigue.  Remains on ramipril, spironolactone, and furosemide.  Recent labs demonstrated stable potassium and renal function as outlined above.  In the setting of COVID-19 pandemic and patient preference, repeat labs have been deferred at this time.  CHF education discussed in detail.  After she has received both of her COVID-19 vaccinations she is interested in coming in for an in person visit at which time we will have her undertake repeat echocardiogram following percutaneous revascularization to see if there has been an improvement in her LV systolic function.  She has chronic stable dyspnea in the setting of paralyzed vocal cord.  3. Persistent A. Fib: She denies any symptoms of tachypalpitations with controlled heart rate at home.  Intolerant to beta-blocker secondary to profound fatigue and hypotension.  Remains on rate control with verapamil.  In the setting of CHADS2VASc of 6 (CHF, HTN, age x 2, vascular disease, female) she remains on Eliquis 5 mg twice daily, she does not meet reduced dosing criteria.  No symptoms concerning for bleeding.  Recent hemoglobin stable as above.  4. HTN: Blood pressure remains well controlled.  Continue current medications.  5. HLD: Intolerant to high intensity statins.  Tolerating Zetia and Repatha with LDL at goal as above.  6. Physical deconditioning:  This appears to be her greatest limiting factor at this time.  She has previously undergone physical therapy though did not tolerate this secondary to severe low back pain and leg pain.  She will continue current activity status as tolerated.  COVID-19 Education: The signs and symptoms of COVID-19 were discussed with the patient and how to seek care for testing (follow up with PCP or arrange E-visit).  The importance of social distancing was discussed today.  Time:   Today, I have spent 18 minutes with the patient with telehealth technology discussing the above problems.     Medication Adjustments/Labs and Tests Ordered: Current medicines are reviewed at length with the patient today.  Concerns regarding medicines are outlined above.   Tests Ordered: No orders of the defined types were placed in this encounter.   Medication Changes: No orders of the defined types were placed in this encounter.   Follow Up:  In Person in 4  month(s)  Signed, Christell Faith, PA-C  10/15/2019 11:33 AM    [facility]

## 2019-10-15 ENCOUNTER — Other Ambulatory Visit: Payer: Self-pay

## 2019-10-15 ENCOUNTER — Telehealth (INDEPENDENT_AMBULATORY_CARE_PROVIDER_SITE_OTHER): Payer: Medicare Other | Admitting: Physician Assistant

## 2019-10-15 ENCOUNTER — Other Ambulatory Visit: Payer: Self-pay | Admitting: Cardiovascular Disease

## 2019-10-15 VITALS — BP 124/64 | HR 72 | Ht 63.0 in | Wt 193.0 lb

## 2019-10-15 DIAGNOSIS — I10 Essential (primary) hypertension: Secondary | ICD-10-CM

## 2019-10-15 DIAGNOSIS — I5042 Chronic combined systolic (congestive) and diastolic (congestive) heart failure: Secondary | ICD-10-CM | POA: Diagnosis not present

## 2019-10-15 DIAGNOSIS — E785 Hyperlipidemia, unspecified: Secondary | ICD-10-CM

## 2019-10-15 DIAGNOSIS — I2510 Atherosclerotic heart disease of native coronary artery without angina pectoris: Secondary | ICD-10-CM | POA: Diagnosis not present

## 2019-10-15 DIAGNOSIS — I4819 Other persistent atrial fibrillation: Secondary | ICD-10-CM | POA: Diagnosis not present

## 2019-10-15 DIAGNOSIS — R5381 Other malaise: Secondary | ICD-10-CM

## 2019-10-15 NOTE — Patient Instructions (Signed)
Medication Instructions:  Your physician recommends that you continue on your current medications as directed. Please refer to the Current Medication list given to you today.  *If you need a refill on your cardiac medications before your next appointment, please call your pharmacy*  Lab Work: None ordered  If you have labs (blood work) drawn today and your tests are completely normal, you will receive your results only by: [name] MyChart Message (if you have MyChart) OR . A paper copy in the mail If you have any lab test that is abnormal or we need to change your treatment, we will call you to review the results.  Testing/Procedures: 1- Echo  Please return to [facility] on _(4-5 months)_____________ at _______________ AM/PM for an Echocardiogram. Your physician has requested that you have an echocardiogram. Echocardiography is a painless test that uses sound waves to create images of your heart. It provides your doctor with information about the size and shape of your heart and how well your heart's chambers and valves are working. This procedure takes approximately one hour. There are no restrictions for this procedure. Please note; depending on visual quality an IV may need to be placed.    Follow-Up: At [facility], you and your health needs are our priority.  As part of our continuing mission to provide you with exceptional heart care, we have created designated Provider Care Teams.  These Care Teams include your primary Cardiologist (physician) and Advanced Practice Providers (APPs -  Physician Assistants and Nurse Practitioners) who all work together to provide you with the care you need, when you need it.  Your next appointment:   4 month(s)  The format for your next appointment:   In Person  Provider:    You may see Ida Rogue, MD or Christell Faith, PA-C.

## 2019-10-22 DIAGNOSIS — Z23 Encounter for immunization: Secondary | ICD-10-CM | POA: Diagnosis not present

## 2019-10-29 ENCOUNTER — Other Ambulatory Visit: Payer: Self-pay | Admitting: Internal Medicine

## 2019-10-29 NOTE — Telephone Encounter (Signed)
Pt needs refill on traMADol (ULTRAM) 50 MG tablet.

## 2019-10-29 NOTE — Telephone Encounter (Signed)
Last OV 09/18/19 Next OV none scheduled Last refill 09/24/19

## 2019-10-30 MED ORDER — TRAMADOL HCL 50 MG PO TABS: ORAL_TABLET | ORAL | 1 refills | Status: DC

## 2019-10-30 NOTE — Telephone Encounter (Signed)
rx sent in for tramadol.

## 2019-11-03 ENCOUNTER — Other Ambulatory Visit: Payer: Self-pay | Admitting: *Deleted

## 2019-11-03 MED ORDER — REPATHA SURECLICK 140 MG/ML ~~LOC~~ SOAJ: 1.0000 "pen " | SUBCUTANEOUS | 3 refills | Status: DC

## 2019-11-08 ENCOUNTER — Other Ambulatory Visit: Payer: Self-pay | Admitting: Cardiovascular Disease

## 2019-11-19 DIAGNOSIS — Z23 Encounter for immunization: Secondary | ICD-10-CM | POA: Diagnosis not present

## 2019-12-22 ENCOUNTER — Other Ambulatory Visit: Payer: Self-pay | Admitting: Internal Medicine

## 2019-12-23 NOTE — Telephone Encounter (Signed)
Last OV 09/18/2019 Next OV none Last refill 10/30/19

## 2020-01-29 ENCOUNTER — Telehealth: Payer: Self-pay | Admitting: Internal Medicine

## 2020-01-29 NOTE — Telephone Encounter (Signed)
Faxed over to [facility]

## 2020-01-29 NOTE — Telephone Encounter (Signed)
Charlotte Wagner with [facility] needs notes from PCP for nursing assessment. Please call her back at [phone]

## 2020-02-02 ENCOUNTER — Other Ambulatory Visit: Payer: Self-pay | Admitting: Cardiovascular Disease

## 2020-02-03 ENCOUNTER — Telehealth: Payer: Self-pay | Admitting: Cardiovascular Disease

## 2020-02-03 ENCOUNTER — Other Ambulatory Visit
Admission: RE | Admit: 2020-02-03 | Discharge: 2020-02-03 | Disposition: A | Discharge status: Home / Self Care | Payer: Medicare Other | Source: Ambulatory Visit | Attending: Internal Medicine | Admitting: Internal Medicine

## 2020-02-03 DIAGNOSIS — R739 Hyperglycemia, unspecified: Secondary | ICD-10-CM | POA: Diagnosis not present

## 2020-02-03 DIAGNOSIS — I10 Essential (primary) hypertension: Secondary | ICD-10-CM | POA: Insufficient documentation

## 2020-02-03 DIAGNOSIS — D649 Anemia, unspecified: Secondary | ICD-10-CM | POA: Diagnosis not present

## 2020-02-03 DIAGNOSIS — E7800 Pure hypercholesterolemia, unspecified: Secondary | ICD-10-CM | POA: Diagnosis not present

## 2020-02-03 LAB — BASIC METABOLIC PANEL
Anion gap: 9 (ref 5–15)
BUN: 17 mg/dL (ref 8–23)
CO2: 27 mmol/L (ref 22–32)
Calcium: 9.5 mg/dL (ref 8.9–10.3)
Chloride: 99 mmol/L (ref 98–111)
Creatinine, Ser: 0.92 mg/dL (ref 0.44–1.00)
GFR calc Af Amer: >60 mL/min (ref >60)
GFR calc non Af Amer: 58 mL/min — ABNORMAL LOW (ref >60)
Glucose, Bld: 103 mg/dL — ABNORMAL HIGH (ref 70–99)
Potassium: 4.1 mmol/L (ref 3.5–5.1)
Sodium: 135 mmol/L (ref 135–145)

## 2020-02-03 LAB — HEPATIC FUNCTION PANEL
ALT: 21 U/L (ref 0–44)
AST: 25 U/L (ref 15–41)
Albumin: 4.4 g/dL (ref 3.5–5.0)
Alkaline Phosphatase: 65 U/L (ref 38–126)
Bilirubin, Direct: 0.2 mg/dL (ref 0.0–0.2)
Indirect Bilirubin: 0.9 mg/dL (ref 0.3–0.9)
Total Bilirubin: 1.1 mg/dL (ref 0.3–1.2)
Total Protein: 7 g/dL (ref 6.5–8.1)

## 2020-02-03 LAB — CBC WITH DIFFERENTIAL/PLATELET
Abs Immature Granulocytes: 0.02 10*3/uL (ref 0.00–0.07)
Basophils Absolute: 0 10*3/uL (ref 0.0–0.1)
Basophils Relative: 0 %
Eosinophils Absolute: 0.1 10*3/uL (ref 0.0–0.5)
Eosinophils Relative: 2 %
HCT: 37.7 % (ref 36.0–46.0)
Hemoglobin: 12.5 g/dL (ref 12.0–15.0)
Immature Granulocytes: 1 %
Lymphocytes Relative: 23 %
Lymphs Abs: 0.9 10*3/uL (ref 0.7–4.0)
MCH: 33.7 pg (ref 26.0–34.0)
MCHC: 33.2 g/dL (ref 30.0–36.0)
MCV: 101.6 fL — ABNORMAL HIGH (ref 80.0–100.0)
Monocytes Absolute: 0.6 10*3/uL (ref 0.1–1.0)
Monocytes Relative: 14 %
Neutro Abs: 2.4 10*3/uL (ref 1.7–7.7)
Neutrophils Relative %: 60 %
Platelets: 192 10*3/uL (ref 150–400)
RBC: 3.71 MIL/uL — ABNORMAL LOW (ref 3.87–5.11)
RDW: 12.7 % (ref 11.5–15.5)
WBC: 4 10*3/uL (ref 4.0–10.5)
nRBC: 0 % (ref 0.0–0.2)

## 2020-02-03 LAB — LIPID PANEL
Cholesterol: 120 mg/dL (ref 0–200)
HDL: 55 mg/dL (ref >40)
LDL Cholesterol: 48 mg/dL (ref 0–99)
Total CHOL/HDL Ratio: 2.2 RATIO
Triglycerides: 86 mg/dL (ref <150)
VLDL: 17 mg/dL (ref 0–40)

## 2020-02-03 LAB — HEMOGLOBIN A1C
Hgb A1c MFr Bld: 5.7 % — ABNORMAL HIGH (ref 4.8–5.6)
Mean Plasma Glucose: 116.89 mg/dL

## 2020-02-03 LAB — TSH: TSH: 2.004 u[IU]/mL (ref 0.350–4.500)

## 2020-02-03 NOTE — Addendum Note (Signed)
Addended by: Santiago Bur on: 02/03/2020 09:00 AM   Modules accepted: Orders

## 2020-02-03 NOTE — Telephone Encounter (Signed)
Patient had labs today over at [facility] and she wanted you to review them when possible.

## 2020-02-03 NOTE — Addendum Note (Signed)
Addended by: Santiago Bur on: 02/03/2020 09:01 AM   Modules accepted: Orders

## 2020-02-03 NOTE — Telephone Encounter (Signed)
Patient calling  States that she went to [facility] today to have labs completed for Dr Rockey Situ Lab said they had no orders from Dr Rockey Situ but many orders from Dr Nicki Reaper Wanted to make office aware to review labs and make sure all that was needed was completed  Please call if needed

## 2020-02-04 ENCOUNTER — Other Ambulatory Visit: Payer: Self-pay | Admitting: Internal Medicine

## 2020-02-04 DIAGNOSIS — D7589 Other specified diseases of blood and blood-forming organs: Secondary | ICD-10-CM

## 2020-02-04 DIAGNOSIS — Z862 Personal history of diseases of the blood and blood-forming organs and certain disorders involving the immune mechanism: Secondary | ICD-10-CM

## 2020-02-07 NOTE — Telephone Encounter (Signed)
Labs reviewed, all fantastic Don't change a thing

## 2020-02-08 NOTE — Telephone Encounter (Signed)
Spoke with patient and reviewed provider recommendations. She verbalized understanding and then wanted to confirm her upcoming appointments. Reviewed those dates, times, and location with her in detail. She was appreciative for the call with no further questions at this time.

## 2020-02-16 ENCOUNTER — Telehealth: Payer: Self-pay | Admitting: Internal Medicine

## 2020-02-16 ENCOUNTER — Other Ambulatory Visit: Payer: Self-pay

## 2020-02-16 ENCOUNTER — Ambulatory Visit (INDEPENDENT_AMBULATORY_CARE_PROVIDER_SITE_OTHER): Payer: Medicare Other

## 2020-02-16 DIAGNOSIS — I5042 Chronic combined systolic (congestive) and diastolic (congestive) heart failure: Secondary | ICD-10-CM

## 2020-02-16 NOTE — Telephone Encounter (Signed)
Charlotte Wagner with [facility] called to see if we received a FL2 form. Please call her at [phone]

## 2020-02-17 ENCOUNTER — Telehealth: Payer: Self-pay

## 2020-02-17 ENCOUNTER — Other Ambulatory Visit: Payer: Self-pay | Admitting: Internal Medicine

## 2020-02-17 NOTE — Telephone Encounter (Signed)
Refill request for tramadol, last seen 09-18-19, last filled 12-24-19.  Please advise.

## 2020-02-17 NOTE — Telephone Encounter (Signed)
Call to patient to review echo results.    Pt verbalized understanding and has no further questions at this time.    Advised pt to call for any further questions or concerns.  No further orders.  Confirmed appt next week.  

## 2020-02-17 NOTE — Telephone Encounter (Signed)
rx ok'd for tramadol #30 with one refill.

## 2020-02-17 NOTE — Telephone Encounter (Signed)
Called Debbie to let her know we have not received FL-2. Left detailed VM with fax number to resend and call back number if any questions.

## 2020-02-17 NOTE — Telephone Encounter (Signed)
-----   Message from Rise Mu, PA-C sent at 02/17/2020  7:39 AM EDT ----- Pump function is much improved and normal with normal wall motion. Moderate thickening of the heart. Moderately reduced right-side pump function with severely elevated pressure in the right side of the heart. Moderate enlargement of the bilateral atria. Moderate to severe mitral regurgitation. Severe tricuspid regurgitation. Trivial aortic regurgitation with aortic sclerosis without stenosis noted. Mildly dilated pulmonary artery consistent with elevated pulmonary pressures as mentioned above.   Recommendations: -Pump function has normalized -Weakened right side is stable -Can discuss mitral valve findings with possible evaluation by Dr. Burt Knack at her up coming appointment with Dr. Rockey Situ -Reassess volume status at up coming appointment

## 2020-02-21 NOTE — Progress Notes (Signed)
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 office visit    Pt having some concerns with O2. pt also having some edema. Meds verbally reviewed w/ pt.     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  coronary artery disease  prior bypass surgery 1998 stent to  graft 8/17  Echo EF 1/18 with EF 60-65%   moderate to severe left atrial enlargement cardioversion that did not hold,  reverted to atrial fibrillation Did not feel well on b-blocker, better on ca channel blocker Statin intolerance , on Repatha Who presents for routine follow-up of her chronic stable angina, atrial fibrillation   Moving to [facility], Legs weaker, walks with a walker Oxygen dropping with exertion, high 90s at rest Low 90s with exertion Does not think that she needs oxygen  On lasix 40 daily, spironolactone 25 daily Continues to have significant leg swelling Tramadol at night for leg pain  Weigh up 179 to 186 since 2019  Larynx paralyzed  Echo last week 02/2020, results reviewed with her in detail Left ventricular ejection fraction, by estimation, is 60 to 65%. The  left ventricle has normal function. right ventricular size is moderately enlarged. severely elevated  pulmonary artery systolic pressure.  TR Peak grad:  61.8 mmHg  TR Vmax:    393.00 cm/s   EKG personally reviewed by myself on todays visit  atrial fib rate 71   Cath 03/31/2018, prior results discussed with her Significant underlying three-vessel coronary artery disease with patent grafts including LIMA to LAD, SVG to RCA, SVG to OM and SVG to  diagonal.  Patent stent in the SVG to RCA.  Severe new stenosis and SVG to diagonal is likely the culprit.  STENT RESOLUTE ONYX 4.0X15. to SVG to diag   wears compression hose, stable Lasix 40 mg daily Weight 182 is stable   Other labs discussed with her Total chol 142, LDL 53 HBA1C 5.6  other past medical hx reviewed hospital admission for acute on chronic diastolic and systolic CHF Cath [id number] . Significant underlying three-vessel coronary artery disease with patent grafts including LIMA to LAD, SVG to RCA, SVG to OM and SVG to diagonal. Patent stent in the SVG to RCA. Severe new stenosis and SVG to diagonal is likely the culprit. 2. Left ventricular angiography was not performed. 3. High normal left ventricular end-diastolic pressure at 12 mmHg. 4. Successful direct stenting of SVG to diagonal using a distal protection device.  ECHO Prior to cardiac catheterization and stent placement ejection fraction was in the range of 30% to 35%. Hypokinesis of the anteroseptalmyocardium, apical,  anterior myocardium.  - Mitral valve: There was mild regurgitation. - Left atrium: The atrium was mildly to moderately dilated. - Right ventricle: Systolic function was moderately to severely reduced. - Right atrium: The atrium was mildly dilated.  patent foramen ovale. - Tricuspid valve: moderate-severe regurgitation. - Pulmonary arteries: Systolic pressure was moderate to severely elevated PA peak pressure: 58 mm Hg (S).  long history of Statin intolerance, tried 4 different medications, had severe myalgias,  Lipitor,crestor, simvastatin all had severe side effects  on Zetia, tolerating this Cholesterol still above goal, LDL 112 on Zetia alone  History of bleeding from hemorrhoids. Stable recently  Episode of amnesia 03/12/2017 Workup including EEG No further episodes since that time  Carotid ultrasound reviewed with her in detail Moderate atherosclerotic plaque over the  left carotid bulb    Prior CV studies:   The following studies were reviewed today:   Past Medical History:  Diagnosis Date  . Allergy   . Arthritis    s/p bilateral knees and left hip replacement  . Asthma   . CAD (coronary artery disease)    a. 12/1996 s/p CABG x4 ([place]);  b. 2005 Pt reports stress test & cath, which revealed patent grafts.  . Cancer ([place])    melanoma right arm  . Carotid arterial disease ([place])    a. 04/2015 Carotid U/S: <50% bilat ICA stenosis.  . Chronic kidney disease   . Colon polyps    H/O  . Depression   . GERD (gastroesophageal reflux disease)    h/o hiatal hernia  . Headache    migraines in past  . Heart murmur    a. 04/2011 Echo: EF 55-60%, bilat atrial enlargement, mild to mod TR.  [name] History of chicken pox   . History of hiatal hernia   . Hx of migraines    rare now  . Hx: UTI (urinary tract infection)   . Hyperlipidemia    a. Statin intolerant -->on zetia.  . Hypertension   . Hypertensive heart disease   . Lichen planus   . Melanoma ([place])   . Palpitations    a. rare PVC's and h/o SVT.  [name] PMR (polymyalgia rheumatica) (HCC)    h/o in setting of crestor usage.  [name] PSVT (paroxysmal supraventricular tachycardia) ([place])   . PUD (peptic ulcer disease)    remote history  . Raynaud's phenomenon   . Spinal stenosis   . Urine incontinence    H/O  . Vitamin D deficiency    Past Surgical History:  Procedure Laterality Date  . ADENOIDECTOMY     age 23  . BACK SURGERY     L3-L5  . BILATERAL CARPAL TUNNEL RELEASE    . BREAST BIOPSY Right    bx x 3 neg  . BREAST SURGERY Right    biopsy x 3 (all benign)  . CARDIAC CATHETERIZATION N/A 06/01/2016   Procedure: LEFT HEART CATH AND CORS/GRAFTS ANGIOGRAPHY;  Surgeon: Wellington Hampshire, MD;  Location: [facility];  Service: Cardiovascular;  Laterality: N/A;  . CARDIAC CATHETERIZATION N/A 06/01/2016   Procedure: Coronary Stent Intervention;  Surgeon: Wellington Hampshire, MD;  Location:  [place] [facility];  Service: Cardiovascular;  Laterality: N/A;  . CARDIOVERSION N/A 03/08/2017   Procedure: CARDIOVERSION;  Surgeon: Wellington Hampshire, MD;  Location: [facility];  Service: Cardiovascular;  Laterality: N/A;  . CATARACT EXTRACTION W/PHACO Right 01/04/2016   Procedure: CATARACT EXTRACTION PHACO AND INTRAOCULAR LENS PLACEMENT ([place]);  Surgeon: Leandrew Koyanagi, MD;  Location: [facility];  Service: Ophthalmology;  Laterality: Right;  MALYUGIN  . CATARACT EXTRACTION W/PHACO Left 01/25/2016   Procedure: CATARACT EXTRACTION PHACO AND INTRAOCULAR LENS PLACEMENT ([place]) left eye;  Surgeon: Leandrew Koyanagi, MD;  Location: [facility];  Service: Ophthalmology;  Laterality: Left;  MALYUGIN SHUGARCAINE  . CHOLECYSTECTOMY  90's  . COLONOSCOPY WITH PROPOFOL N/A 05/03/2015   Procedure: COLONOSCOPY WITH PROPOFOL;  Surgeon: Lollie Sails, MD;  Location:  [facility];  Service: Endoscopy;  Laterality: N/A;  . CORONARY ARTERY BYPASS GRAFT  98   4 vessel  . CORONARY STENT INTERVENTION N/A 03/31/2018   Procedure: CORONARY STENT INTERVENTION;  Surgeon: Wellington Hampshire, MD;  Location: [place] [facility];  Service: Cardiovascular;  Laterality: N/A;  . ESOPHAGOGASTRODUODENOSCOPY N/A 03/22/2015   Procedure: ESOPHAGOGASTRODUODENOSCOPY (EGD);  Surgeon: Lollie Sails, MD;  [facility];  Service: Endoscopy;  Laterality: N/A;  . HEMORRHOID BANDING    . HEMORRHOID SURGERY N/A 02/17/2018   Procedure: HEMORRHOIDECTOMY;  Surgeon: Robert Bellow, MD;  Location: [facility] OR[facility];  Service: General;  Laterality: N/A;  . JOINT REPLACEMENT     BILATERAL KNEE REPLACEMENTS  . KNEE ARTHROSCOPY W/ OATS PROCEDURE     Lt knee (9/01), Rt knee (3/11), Lt hip (5/10)  . LEFT HEART CATH AND CORONARY ANGIOGRAPHY N/A 03/31/2018   Procedure: LEFT HEART CATH AND CORONARY ANGIOGRAPHY;  Surgeon: Wellington Hampshire, MD;  Location: [place];  Service: Cardiovascular;  Laterality: N/A;  .  TOTAL HIP ARTHROPLASTY       Current Meds  Medication Sig  . acetaminophen (TYLENOL) 500 MG tablet Take 1,000 mg by mouth 2 (two) times daily.   [name] amitriptyline (ELAVIL) 25 MG tablet Take 25 mg by mouth at bedtime  . b complex vitamins tablet Take 1 tablet by mouth daily.  . cefdinir (OMNICEF) 300 MG capsule Take 1 capsule (300 mg total) by mouth 2 (two) times daily.  . Cholecalciferol (VITAMIN D3) 2000 UNITS TABS Take 2,000 Units by mouth 2 (two) times a week.   . clopidogrel (PLAVIX) 75 MG tablet TAKE 1 TABLET(75 MG) BY MOUTH DAILY WITH BREAKFAST  . Coenzyme Q10 (COQ-10) 200 MG CAPS Take 200 mg by mouth daily.   [name] conjugated estrogens (PREMARIN) vaginal cream Apply 0.5mg  (pea-sized amount)  just inside the vaginal introitus with a finger-tip on  Monday, Wednesday and Friday nights. (Patient taking differently: as needed. Apply 0.5mg  (pea-sized amount)  just inside the vaginal introitus with a finger-tip on  Monday, Wednesday and Friday nights.)  . CRANBERRY PO Take 1 capsule by mouth daily.  [name] ELIQUIS 5 MG TABS tablet TAKE 1 TABLET(5 MG) BY MOUTH TWICE DAILY  . Evolocumab (REPATHA SURECLICK) [id number] MG/ML SOAJ Inject 1 pen as directed every 14 (fourteen) days.  [name] ezetimibe (ZETIA) 10 MG tablet TAKE 1 TABLET(10 MG) BY MOUTH DAILY  . folic acid (FOLVITE) 1 MG tablet TAKE 1 TABLET(1 MG) BY MOUTH DAILY  . furosemide (LASIX) 40 MG tablet TAKE 1 TABLET(40 MG) BY MOUTH DAILY  . hydrocortisone 2.5 % cream Apply to VULVA at bedtime as needed with miconazole for lichen planus  . ipratropium (ATROVENT HFA) 17 MCG/ACT inhaler inhale 2 puffs INTO THE LUNGS every 6 hours if needed for wheezing  . ketoconazole (NIZORAL) 2 % cream Apply 1 application topically daily as needed (for crack bleeding fingers.).  [name] loratadine (CLARITIN) 10 MG tablet Take 10 mg by mouth daily.  . meclizine (ANTIVERT) 25 MG tablet Take 25 mg by mouth 3 (three) times daily as needed for dizziness.  . miconazole (MICOTIN) 2 % cream  Apply 1 application topically at bedtime as needed (with hydrocortisone for lichen planus).   . miconazole (MICOTIN) 2 % powder Apply topically as needed for itching.  . Multiple Vitamins-Minerals (HAIR/SKIN/NAILS PO) Take 1 tablet by mouth daily.  . mupirocin ointment (BACTROBAN) 2 % Apply to affected area bid (Patient taking differently: Apply to affected area bid  as needed)  . nitroGLYCERIN (NITROSTAT) 0.4 MG SL tablet Place 1 tablet (0.4 mg total) under the tongue every 5 (five) minutes as needed for chest pain.  . pantoprazole (PROTONIX) 40 MG tablet TAKE 1 TABLET(40 MG) BY MOUTH TWICE DAILY  . polyethylene glycol powder (GLYCOLAX/MIRALAX) powder Take 8.5 g by mouth daily as needed.   [name] PREVIDENT 5000 BOOSTER PLUS 1.1 % PSTE USE TID AFTER MEALS . DO NOT RINSE  . ramipril (ALTACE) 5 MG capsule TAKE 1 CAPSULE(5 MG) BY MOUTH DAILY  . spironolactone (ALDACTONE) 25 MG tablet TAKE 1 TABLET(25 MG) BY MOUTH EVERY MORNING  . traMADol (ULTRAM) 50 MG tablet TAKE 1 TABLET(50 MG) BY MOUTH TWICE DAILY AS NEEDED  . traZODone (DESYREL) 150 MG tablet Take 1 tablet to  1and 1/3 tablets q hs  . verapamil (CALAN-SR) 180 MG CR tablet TAKE 1 TABLET(180 MG) BY MOUTH TWICE DAILY  . vitamin B-12 (CYANOCOBALAMIN) 1000 MCG tablet Take 1,000 mcg by mouth daily.  . vitamin C (ASCORBIC ACID) 500 MG tablet Take 500 mg by mouth at bedtime.     Allergies:   Beta adrenergic blockers; Brilinta [ticagrelor]; Albuterol; Antihistamines, diphenhydramine-type; Aspirin; Nsaids; Penicillins; Statins; Sulfa antibiotics; Sulfasalazine; Tetracycline; and Tetracyclines & related   Social History   Tobacco Use  . Smoking status: Former Research scientist (life sciences)  . Smokeless tobacco: Never Used  . Tobacco comment: quit 47+ yrs ago  Substance Use Topics  . Alcohol use: No    Alcohol/week: 0.0 standard drinks  . Drug use: No       Family Hx: The patient's family history includes Alcohol abuse in her father; Depression in her daughter, father,  maternal grandmother, and son; Heart disease in her father; Hypertension in her maternal grandfather and paternal grandfather; Lung cancer in her sister; Seizures in her son; Stroke in her maternal grandmother; Thyroid disease in her daughter and mother. There is no history of Breast cancer.  ROS:   Please see the history of present illness.    Review of Systems  Constitutional: Negative.   HENT: Negative.   Respiratory: Positive for shortness of breath.   Cardiovascular: Negative.   Gastrointestinal: Negative.   Musculoskeletal: Negative.        Gait instability  Neurological: Negative.   Psychiatric/Behavioral: Negative.   All other systems reviewed and are negative.    Labs/Other Tests and Data Reviewed:    Recent Labs: 02/03/2020: ALT 21; BUN 17; Creatinine, Ser 0.92; Hemoglobin 12.5; Platelets 192; Potassium 4.1; Sodium 135; TSH 2.004   Recent Lipid Panel Lab Results  Component Value Date/Time   CHOL 120 02/03/2020 09:03 AM   TRIG 86 02/03/2020 09:03 AM   HDL 55 02/03/2020 09:03 AM   CHOLHDL 2.2 02/03/2020 09:03 AM   LDLCALC 48 02/03/2020 09:03 AM   LDLDIRECT 126.7 07/03/2013 09:37 AM    Wt Readings from Last 3 Encounters:  02/22/20 186 lb (84.4 kg)  10/15/19 193 lb (87.5 kg)  09/18/19 191 lb (86.6 kg)     Exam:    Vital Signs: Vital signs may also be detailed in the HPI BP 118/66 (BP Location: Left Arm, Patient Position: Sitting, Cuff Size: Normal)   Pulse 71   Ht 5\' 3"  (1.6 m)   Wt 186 lb (84.4 kg)   SpO2 96%   BMI 32.95 kg/m    Constitutional:  oriented to person, place, and time. No distress.  HENT:  Head: Grossly normal Eyes:  no discharge. No scleral icterus.  Neck: No JVD, no carotid  bruits  Cardiovascular: Irregularly irregular, no murmurs appreciated 1+ pitting lower extremity edema, compression hose in place Pulmonary/Chest: Clear to auscultation bilaterally, no wheezes or rails Abdominal: Soft.  no distension.  no tenderness.  Musculoskeletal:  Normal range of motion Neurological:  normal muscle tone. Coordination normal. No atrophy Skin: Skin warm and dry Psychiatric: normal affect, pleasant   ASSESSMENT & PLAN:    Persistent atrial fibrillation On eliquis, rate well controlled Contributing to her pulmonary hypertension  Pulmonary hypertension Markedly elevated pressures on echocardiogram She has shortness of breath, hypoxia, leg swelling Recommend we increase Lasix up to 40 twice daily  With spironolactone Weight is up 6-7 pounds  Chronic diastolic heart failure (HCC) High fluid, leg swelling, pulmonary hypertension, plan as above  Coronary artery disease of native artery of native heart with stable angina pectoris (HCC) Currently with no symptoms of angina. No further workup at this time. Continue current medication regimen. Stable  Hypercholesterolemia Cholesterol at goal, no medication changes made  Essential hypertension, benign Blood pressure is well controlled on today's visit. No changes made to the medications.  Dyspnea, unspecified type Secondary to pulmonary hypertension, high-dose diuretic as above  TGA (transient global amnesia) No sx, stable     Disposition: Follow-up in 6 months   Signed, Ida Rogue, MD  02/22/2020 3:18 PM    [place] Office [address] [place] [address], [place], [place] [address]

## 2020-02-22 ENCOUNTER — Telehealth: Payer: Self-pay | Admitting: Internal Medicine

## 2020-02-22 ENCOUNTER — Other Ambulatory Visit: Payer: Self-pay

## 2020-02-22 ENCOUNTER — Ambulatory Visit (INDEPENDENT_AMBULATORY_CARE_PROVIDER_SITE_OTHER): Payer: Medicare Other | Admitting: Cardiovascular Disease

## 2020-02-22 VITALS — BP 118/66 | HR 71 | Ht 63.0 in | Wt 186.0 lb

## 2020-02-22 DIAGNOSIS — I10 Essential (primary) hypertension: Secondary | ICD-10-CM

## 2020-02-22 DIAGNOSIS — I119 Hypertensive heart disease without heart failure: Secondary | ICD-10-CM | POA: Diagnosis not present

## 2020-02-22 DIAGNOSIS — I5042 Chronic combined systolic (congestive) and diastolic (congestive) heart failure: Secondary | ICD-10-CM

## 2020-02-22 DIAGNOSIS — I25118 Atherosclerotic heart disease of native coronary artery with other forms of angina pectoris: Secondary | ICD-10-CM | POA: Diagnosis not present

## 2020-02-22 DIAGNOSIS — E785 Hyperlipidemia, unspecified: Secondary | ICD-10-CM | POA: Diagnosis not present

## 2020-02-22 DIAGNOSIS — E7800 Pure hypercholesterolemia, unspecified: Secondary | ICD-10-CM | POA: Diagnosis not present

## 2020-02-22 DIAGNOSIS — I2510 Atherosclerotic heart disease of native coronary artery without angina pectoris: Secondary | ICD-10-CM | POA: Diagnosis not present

## 2020-02-22 DIAGNOSIS — I4819 Other persistent atrial fibrillation: Secondary | ICD-10-CM

## 2020-02-22 MED ORDER — FUROSEMIDE 40 MG PO TABS: 40.0000 mg | ORAL_TABLET | Freq: Two times a day (BID) | ORAL | 3 refills | Status: DC

## 2020-02-22 NOTE — Telephone Encounter (Signed)
[facility] called back wanting to know why patient needed an appointment for the [facility] Please contact them at [phone]

## 2020-02-22 NOTE — Patient Instructions (Addendum)
Medication Instructions:  Please increase the lasix  To 40 twice a day 8 Am and 2 pm Or 10 am and 2 pm  Goal weight about 175 to 176 pounds Around that weight we will check labs   If you need a refill on your cardiac medications before your next appointment, please call your pharmacy.    Lab work: No new labs needed   If you have labs (blood work) drawn today and your tests are completely normal, you will receive your results only by: [name] MyChart Message (if you have MyChart) OR . A paper copy in the mail If you have any lab test that is abnormal or we need to change your treatment, we will call you to review the results.   Testing/Procedures: No new testing needed   Follow-Up: At [facility], you and your health needs are our priority.  As part of our continuing mission to provide you with exceptional heart care, we have created designated Provider Care Teams.  These Care Teams include your primary Cardiologist (physician) and Advanced Practice Providers (APPs -  Physician Assistants and Nurse Practitioners) who all work together to provide you with the care you need, when you need it.  . You will need a follow up appointment in 3 months   . Providers on your designated Care Team:   . Murray Hodgkins, NP . Christell Faith, PA-C . Marrianne Mood, PA-C  Any Other Special Instructions Will Be Listed Below (If Applicable).  For educational health videos Log in to : [email or web address] Or : [email or web address], password : [email or web address]

## 2020-02-22 NOTE — Telephone Encounter (Signed)
Nurse from [facility] called asking about a fax she sent twice requesting a FL2 . Her number isd [phone]

## 2020-02-22 NOTE — Telephone Encounter (Signed)
Left message for patient letting her know that we did receive fax and pt will have to have visit to finish completing form.

## 2020-02-23 ENCOUNTER — Other Ambulatory Visit: Payer: Self-pay | Admitting: Cardiovascular Disease

## 2020-02-23 ENCOUNTER — Other Ambulatory Visit: Payer: Self-pay

## 2020-02-23 ENCOUNTER — Telehealth: Payer: Self-pay | Admitting: Internal Medicine

## 2020-02-23 DIAGNOSIS — Z0279 Encounter for issue of other medical certificate: Secondary | ICD-10-CM

## 2020-02-23 NOTE — Telephone Encounter (Signed)
Faxed to Debbie.

## 2020-02-23 NOTE — Telephone Encounter (Signed)
I will be going over [place] with patient to and will fax this form over as soon as it is completed. Patient is not moving until Thursday.

## 2020-02-23 NOTE — Telephone Encounter (Signed)
Debbie from [facility] needs a FL2 for patient. Debbie's number is [phone].

## 2020-02-23 NOTE — Telephone Encounter (Signed)
Signed and placed in box.   

## 2020-02-23 NOTE — Telephone Encounter (Signed)
FL-2 completed with patient. Confirmed with Jackelyn Poling that problem list and meds could be printed as long as Dr Nicki Reaper signs each page. Placed out beside my desk for you to sign so I can fax to her this PM. Pt is moving Thursday

## 2020-02-29 ENCOUNTER — Other Ambulatory Visit: Payer: Self-pay | Admitting: Internal Medicine

## 2020-03-01 ENCOUNTER — Telehealth: Payer: Self-pay | Admitting: Internal Medicine

## 2020-03-01 NOTE — Telephone Encounter (Signed)
[name] from [place], fax [phone], She needs the [facility] and medication list that was faxed over last week and any orders. Any questions call her at [phone].

## 2020-03-01 NOTE — Telephone Encounter (Signed)
Fl2 form and medication list faxed over this morning; fax was confirmed as sent.

## 2020-03-03 ENCOUNTER — Telehealth: Payer: Self-pay

## 2020-03-03 NOTE — Telephone Encounter (Signed)
Fax received from [employer] requesting Prior Authorization for [place]. PA initiated and clinical questions answered. Sent to plan at 11:10:27 (confirmed by Dorann Lodge. at [email or web address]). Waiting for plan determination. KEY: [id number]

## 2020-03-04 NOTE — Telephone Encounter (Signed)
[place] calling - states Repatha has received an approval  You may call [phone] with any questions

## 2020-03-09 ENCOUNTER — Telehealth: Payer: Self-pay | Admitting: Cardiovascular Disease

## 2020-03-09 ENCOUNTER — Other Ambulatory Visit: Payer: Self-pay

## 2020-03-09 MED ORDER — APIXABAN 5 MG PO TABS: ORAL_TABLET | ORAL | 1 refills | Status: DC

## 2020-03-09 NOTE — Telephone Encounter (Signed)
Patient has moved to assisted living from independent living at [facility] and is calling to make [place] aware she is [facility]. Patient states she is on repatha assistance and it will need to be updated to [facility] to [facility], she will call next week to call that in. Ebony Hail is a pharmacist there who is aware of her situation

## 2020-03-09 NOTE — Telephone Encounter (Signed)
[facility] updated in the patient's chart.  We will wait to send in a refill per the patient request when the patient contacts our office next week.

## 2020-03-16 ENCOUNTER — Encounter: Payer: Self-pay | Admitting: Intensive Care

## 2020-03-16 ENCOUNTER — Inpatient Hospital Stay
Admission: EM | Admit: 2020-03-16 | Discharge: 2020-03-19 | DRG: 378 | Disposition: A | Discharge status: Home Health | Payer: Medicare Other | Attending: Internal Medicine | Admitting: Internal Medicine

## 2020-03-16 ENCOUNTER — Telehealth: Payer: Self-pay | Admitting: Internal Medicine

## 2020-03-16 ENCOUNTER — Other Ambulatory Visit: Payer: Self-pay

## 2020-03-16 DIAGNOSIS — Z96653 Presence of artificial knee joint, bilateral: Secondary | ICD-10-CM | POA: Diagnosis present

## 2020-03-16 DIAGNOSIS — I25118 Atherosclerotic heart disease of native coronary artery with other forms of angina pectoris: Secondary | ICD-10-CM

## 2020-03-16 DIAGNOSIS — L439 Lichen planus, unspecified: Secondary | ICD-10-CM | POA: Diagnosis present

## 2020-03-16 DIAGNOSIS — I482 Chronic atrial fibrillation, unspecified: Secondary | ICD-10-CM

## 2020-03-16 DIAGNOSIS — F329 Major depressive disorder, single episode, unspecified: Secondary | ICD-10-CM | POA: Diagnosis present

## 2020-03-16 DIAGNOSIS — R58 Hemorrhage, not elsewhere classified: Secondary | ICD-10-CM | POA: Diagnosis not present

## 2020-03-16 DIAGNOSIS — Z79899 Other long term (current) drug therapy: Secondary | ICD-10-CM | POA: Diagnosis not present

## 2020-03-16 DIAGNOSIS — Z881 Allergy status to other antibiotic agents status: Secondary | ICD-10-CM

## 2020-03-16 DIAGNOSIS — I480 Paroxysmal atrial fibrillation: Secondary | ICD-10-CM | POA: Diagnosis present

## 2020-03-16 DIAGNOSIS — Z96642 Presence of left artificial hip joint: Secondary | ICD-10-CM | POA: Diagnosis present

## 2020-03-16 DIAGNOSIS — K648 Other hemorrhoids: Secondary | ICD-10-CM | POA: Diagnosis present

## 2020-03-16 DIAGNOSIS — Z801 Family history of malignant neoplasm of trachea, bronchus and lung: Secondary | ICD-10-CM

## 2020-03-16 DIAGNOSIS — K5731 Diverticulosis of large intestine without perforation or abscess with bleeding: Secondary | ICD-10-CM | POA: Diagnosis present

## 2020-03-16 DIAGNOSIS — Z880 Allergy status to penicillin: Secondary | ICD-10-CM

## 2020-03-16 DIAGNOSIS — Z7901 Long term (current) use of anticoagulants: Secondary | ICD-10-CM | POA: Diagnosis not present

## 2020-03-16 DIAGNOSIS — K279 Peptic ulcer, site unspecified, unspecified as acute or chronic, without hemorrhage or perforation: Secondary | ICD-10-CM | POA: Diagnosis present

## 2020-03-16 DIAGNOSIS — M4800 Spinal stenosis, site unspecified: Secondary | ICD-10-CM | POA: Diagnosis present

## 2020-03-16 DIAGNOSIS — I4891 Unspecified atrial fibrillation: Secondary | ICD-10-CM | POA: Diagnosis present

## 2020-03-16 DIAGNOSIS — Z882 Allergy status to sulfonamides status: Secondary | ICD-10-CM

## 2020-03-16 DIAGNOSIS — K922 Gastrointestinal hemorrhage, unspecified: Secondary | ICD-10-CM

## 2020-03-16 DIAGNOSIS — Z8349 Family history of other endocrine, nutritional and metabolic diseases: Secondary | ICD-10-CM

## 2020-03-16 DIAGNOSIS — Z20822 Contact with and (suspected) exposure to covid-19: Secondary | ICD-10-CM | POA: Diagnosis present

## 2020-03-16 DIAGNOSIS — Z886 Allergy status to analgesic agent status: Secondary | ICD-10-CM

## 2020-03-16 DIAGNOSIS — Z66 Do not resuscitate: Secondary | ICD-10-CM | POA: Diagnosis present

## 2020-03-16 DIAGNOSIS — K5900 Constipation, unspecified: Secondary | ICD-10-CM | POA: Diagnosis present

## 2020-03-16 DIAGNOSIS — I2510 Atherosclerotic heart disease of native coronary artery without angina pectoris: Secondary | ICD-10-CM | POA: Diagnosis present

## 2020-03-16 DIAGNOSIS — D500 Iron deficiency anemia secondary to blood loss (chronic): Secondary | ICD-10-CM | POA: Diagnosis present

## 2020-03-16 DIAGNOSIS — Z7902 Long term (current) use of antithrombotics/antiplatelets: Secondary | ICD-10-CM | POA: Diagnosis not present

## 2020-03-16 DIAGNOSIS — I7300 Raynaud's syndrome without gangrene: Secondary | ICD-10-CM | POA: Diagnosis present

## 2020-03-16 DIAGNOSIS — K5730 Diverticulosis of large intestine without perforation or abscess without bleeding: Secondary | ICD-10-CM | POA: Diagnosis not present

## 2020-03-16 DIAGNOSIS — K219 Gastro-esophageal reflux disease without esophagitis: Secondary | ICD-10-CM | POA: Diagnosis present

## 2020-03-16 DIAGNOSIS — K3189 Other diseases of stomach and duodenum: Secondary | ICD-10-CM | POA: Diagnosis not present

## 2020-03-16 DIAGNOSIS — Z888 Allergy status to other drugs, medicaments and biological substances status: Secondary | ICD-10-CM

## 2020-03-16 DIAGNOSIS — Z823 Family history of stroke: Secondary | ICD-10-CM

## 2020-03-16 DIAGNOSIS — Z8249 Family history of ischemic heart disease and other diseases of the circulatory system: Secondary | ICD-10-CM

## 2020-03-16 DIAGNOSIS — D62 Acute posthemorrhagic anemia: Secondary | ICD-10-CM | POA: Diagnosis not present

## 2020-03-16 DIAGNOSIS — F32A Depression, unspecified: Secondary | ICD-10-CM | POA: Diagnosis present

## 2020-03-16 DIAGNOSIS — E559 Vitamin D deficiency, unspecified: Secondary | ICD-10-CM | POA: Diagnosis present

## 2020-03-16 DIAGNOSIS — R1013 Epigastric pain: Secondary | ICD-10-CM | POA: Diagnosis not present

## 2020-03-16 DIAGNOSIS — I5032 Chronic diastolic (congestive) heart failure: Secondary | ICD-10-CM | POA: Diagnosis present

## 2020-03-16 DIAGNOSIS — I5042 Chronic combined systolic (congestive) and diastolic (congestive) heart failure: Secondary | ICD-10-CM | POA: Diagnosis present

## 2020-03-16 DIAGNOSIS — L538 Other specified erythematous conditions: Secondary | ICD-10-CM | POA: Diagnosis not present

## 2020-03-16 DIAGNOSIS — I110 Hypertensive heart disease with heart failure: Secondary | ICD-10-CM | POA: Diagnosis present

## 2020-03-16 DIAGNOSIS — E785 Hyperlipidemia, unspecified: Secondary | ICD-10-CM | POA: Diagnosis present

## 2020-03-16 DIAGNOSIS — I4819 Other persistent atrial fibrillation: Secondary | ICD-10-CM | POA: Diagnosis not present

## 2020-03-16 DIAGNOSIS — Z811 Family history of alcohol abuse and dependence: Secondary | ICD-10-CM

## 2020-03-16 DIAGNOSIS — Z87891 Personal history of nicotine dependence: Secondary | ICD-10-CM

## 2020-03-16 DIAGNOSIS — Z818 Family history of other mental and behavioral disorders: Secondary | ICD-10-CM

## 2020-03-16 DIAGNOSIS — R000 Tachycardia, unspecified: Secondary | ICD-10-CM | POA: Diagnosis not present

## 2020-03-16 DIAGNOSIS — K921 Melena: Secondary | ICD-10-CM | POA: Diagnosis not present

## 2020-03-16 DIAGNOSIS — Z85820 Personal history of malignant melanoma of skin: Secondary | ICD-10-CM

## 2020-03-16 DIAGNOSIS — K6389 Other specified diseases of intestine: Secondary | ICD-10-CM | POA: Diagnosis not present

## 2020-03-16 DIAGNOSIS — Z951 Presence of aortocoronary bypass graft: Secondary | ICD-10-CM

## 2020-03-16 DIAGNOSIS — D649 Anemia, unspecified: Secondary | ICD-10-CM | POA: Diagnosis not present

## 2020-03-16 HISTORY — DX: Gastrointestinal hemorrhage, unspecified: K92.2

## 2020-03-16 LAB — CBC WITH DIFFERENTIAL/PLATELET
Abs Immature Granulocytes: 0.03 10*3/uL (ref 0.00–0.07)
Basophils Absolute: 0 10*3/uL (ref 0.0–0.1)
Basophils Relative: 0 %
Eosinophils Absolute: 0.1 10*3/uL (ref 0.0–0.5)
Eosinophils Relative: 1 %
HCT: 36.6 % (ref 36.0–46.0)
Hemoglobin: 12.1 g/dL (ref 12.0–15.0)
Immature Granulocytes: 1 %
Lymphocytes Relative: 14 %
Lymphs Abs: 0.6 10*3/uL — ABNORMAL LOW (ref 0.7–4.0)
MCH: 33.3 pg (ref 26.0–34.0)
MCHC: 33.1 g/dL (ref 30.0–36.0)
MCV: 100.8 fL — ABNORMAL HIGH (ref 80.0–100.0)
Monocytes Absolute: 0.6 10*3/uL (ref 0.1–1.0)
Monocytes Relative: 14 %
Neutro Abs: 3.1 10*3/uL (ref 1.7–7.7)
Neutrophils Relative %: 70 %
Platelets: 228 10*3/uL (ref 150–400)
RBC: 3.63 MIL/uL — ABNORMAL LOW (ref 3.87–5.11)
RDW: 12.8 % (ref 11.5–15.5)
WBC: 4.5 10*3/uL (ref 4.0–10.5)
nRBC: 0 % (ref 0.0–0.2)

## 2020-03-16 LAB — TYPE AND SCREEN
ABO/RH(D): O POS
Antibody Screen: NEGATIVE

## 2020-03-16 LAB — COMPREHENSIVE METABOLIC PANEL
ALT: 20 U/L (ref 0–44)
AST: 27 U/L (ref 15–41)
Albumin: 4.4 g/dL (ref 3.5–5.0)
Alkaline Phosphatase: 69 U/L (ref 38–126)
Anion gap: 13 (ref 5–15)
BUN: 22 mg/dL (ref 8–23)
CO2: 27 mmol/L (ref 22–32)
Calcium: 9.7 mg/dL (ref 8.9–10.3)
Chloride: 96 mmol/L — ABNORMAL LOW (ref 98–111)
Creatinine, Ser: 1.1 mg/dL — ABNORMAL HIGH (ref 0.44–1.00)
GFR calc Af Amer: 55 mL/min — ABNORMAL LOW (ref >60)
GFR calc non Af Amer: 47 mL/min — ABNORMAL LOW (ref >60)
Glucose, Bld: 123 mg/dL — ABNORMAL HIGH (ref 70–99)
Potassium: 3.9 mmol/L (ref 3.5–5.1)
Sodium: 136 mmol/L (ref 135–145)
Total Bilirubin: 0.9 mg/dL (ref 0.3–1.2)
Total Protein: 6.9 g/dL (ref 6.5–8.1)

## 2020-03-16 LAB — URINALYSIS, COMPLETE (UACMP) WITH MICROSCOPIC
Bacteria, UA: NONE SEEN
Bilirubin Urine: NEGATIVE
Glucose, UA: NEGATIVE mg/dL
Hgb urine dipstick: NEGATIVE
Ketones, ur: NEGATIVE mg/dL
Leukocytes,Ua: NEGATIVE
Nitrite: NEGATIVE
Protein, ur: NEGATIVE mg/dL
Specific Gravity, Urine: 1.004 — ABNORMAL LOW (ref 1.005–1.030)
Squamous Epithelial / HPF: NONE SEEN (ref 0–5)
pH: 8 (ref 5.0–8.0)

## 2020-03-16 LAB — HEMOGLOBIN AND HEMATOCRIT, BLOOD
HCT: 36.3 % (ref 36.0–46.0)
Hemoglobin: 12.1 g/dL (ref 12.0–15.0)

## 2020-03-16 LAB — SARS CORONAVIRUS 2 BY RT PCR (HOSPITAL ORDER, PERFORMED IN ~~LOC~~ HOSPITAL LAB): SARS Coronavirus 2: NEGATIVE

## 2020-03-16 MED ORDER — SODIUM CHLORIDE 0.9 % IV SOLN: INTRAVENOUS | Status: DC

## 2020-03-16 MED ORDER — ONDANSETRON HCL 4 MG/2ML IJ SOLN: 4.0000 mg | Freq: Four times a day (QID) | INTRAMUSCULAR | Status: DC | PRN

## 2020-03-16 MED ORDER — PANTOPRAZOLE SODIUM 40 MG IV SOLR
40.0000 mg | INTRAVENOUS | Status: DC
  Administered 2020-03-16: 40 mg via INTRAVENOUS
  Filled 2020-03-16: qty 40

## 2020-03-16 MED ORDER — HYDROCORTISONE 1 % EX CREA
TOPICAL_CREAM | Freq: Two times a day (BID) | CUTANEOUS | Status: DC | PRN
  Filled 2020-03-16: qty 28

## 2020-03-16 MED ORDER — ONDANSETRON HCL 4 MG PO TABS: 4.0000 mg | ORAL_TABLET | Freq: Four times a day (QID) | ORAL | Status: DC | PRN
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
## 2020-03-16 NOTE — ED Notes (Signed)
Pt assisted to bedside toilet with one person assist.

## 2020-03-16 NOTE — H&P (Addendum)
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  Patient has lichen planus in her mouth and vulvar area and is concerned that it has extended into her rectum resulting in rectal bleeding.  Patient had one episode of rectal bleeding at the assisted living facility but had multiple episodes in the emergency room that was witnessed by her nurse.  Stools are described as rust colored. Labs reveal hemoglobin of 12.1g/dl.  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  Patient had requested transfer to [facility] but there are no beds available at this time so she will be admitted here at [facility] with GI consult  Review of Systems: As per HPI otherwise 10 point review of systems negative.    Past Medical History:  Diagnosis Date   Allergy    Arthritis    s/p bilateral knees and left hip replacement   Asthma    CAD (coronary artery disease)    a. 12/1996 s/p CABG x4 [facility], [place]);  b. 2005 Pt reports stress test & cath, which revealed patent grafts.   Cancer [facility])    melanoma right arm   Carotid arterial disease ([place])    a. 04/2015 Carotid U/S: <50% bilat ICA stenosis.   Chronic kidney disease    Colon polyps    H/O   Depression    GERD (gastroesophageal reflux disease)    h/o hiatal hernia   Headache     migraines in past   Heart murmur    a. 04/2011 Echo: EF 55-60%, bilat atrial enlargement, mild to mod TR.   History of chicken pox    History of hiatal hernia    Hx of migraines    rare now   Hx: UTI (urinary tract infection)    Hyperlipidemia    a. Statin intolerant -->on zetia.   Hypertension    Hypertensive heart disease    Lichen planus    Melanoma (HCC)    Palpitations    a. rare PVC's and h/o SVT.   PMR (polymyalgia rheumatica) (HCC)    h/o in setting of crestor usage.   PSVT (paroxysmal supraventricular tachycardia) (HCC)    PUD (peptic ulcer disease)    remote history   Raynaud's phenomenon    Spinal stenosis    Urine incontinence    H/O   Vitamin D deficiency     Past Surgical History:  Procedure Laterality Date   ADENOIDECTOMY     age 27   BACK SURGERY     L3-L5   BILATERAL CARPAL TUNNEL RELEASE     BREAST BIOPSY Right    bx x 3 neg   BREAST SURGERY Right    biopsy x 3 (all benign)   CARDIAC CATHETERIZATION N/A 06/01/2016   Procedure: LEFT HEART CATH AND CORS/GRAFTS ANGIOGRAPHY;  Surgeon: Wellington Hampshire, MD;  Location: [place] CV [facility];  Service:  Cardiovascular;  Laterality: N/A;   CARDIAC CATHETERIZATION N/A 06/01/2016   Procedure: Coronary Stent Intervention;  Surgeon: Wellington Hampshire, MD;  Location: [place] CV [facility];  Service: Cardiovascular;  Laterality: N/A;   CARDIOVERSION N/A 03/08/2017   Procedure: CARDIOVERSION;  Surgeon: Wellington Hampshire, MD;  Location: [facility] ORS;  Service: Cardiovascular;  Laterality: N/A;   CATARACT EXTRACTION W/PHACO Right 01/04/2016   Procedure: CATARACT EXTRACTION PHACO AND INTRAOCULAR LENS PLACEMENT ([place]);  Surgeon: Leandrew Koyanagi, MD;  Location: [address];  Service: Ophthalmology;  Laterality: Right;  MALYUGIN   CATARACT EXTRACTION W/PHACO Left 01/25/2016   Procedure: CATARACT EXTRACTION PHACO AND INTRAOCULAR LENS PLACEMENT ([place]) left eye;  Surgeon: Leandrew Koyanagi, MD;   Location: [place];  Service: Ophthalmology;  Laterality: Left;  MALYUGIN SHUGARCAINE   CHOLECYSTECTOMY  90's   COLONOSCOPY WITH PROPOFOL N/A 05/03/2015   Procedure: COLONOSCOPY WITH PROPOFOL;  Surgeon: Lollie Sails, MD;  Location: [facility] ENDOSCOPY;  Service: Endoscopy;  Laterality: N/A;   CORONARY ARTERY BYPASS GRAFT  98   4 vessel   CORONARY STENT INTERVENTION N/A 03/31/2018   Procedure: CORONARY STENT INTERVENTION;  Surgeon: Wellington Hampshire, MD;  Location: [place] [facility];  Service: Cardiovascular;  Laterality: N/A;   ESOPHAGOGASTRODUODENOSCOPY N/A 03/22/2015   Procedure: ESOPHAGOGASTRODUODENOSCOPY (EGD);  Surgeon: Lollie Sails, MD;  Location: [facility] ENDOSCOPY;  Service: Endoscopy;  Laterality: N/A;   HEMORRHOID BANDING     HEMORRHOID SURGERY N/A 02/17/2018   Procedure: HEMORRHOIDECTOMY;  Surgeon: Robert Bellow, MD;  Location: [facility] ORS;  Service: General;  Laterality: N/A;   JOINT REPLACEMENT     BILATERAL KNEE REPLACEMENTS   KNEE ARTHROSCOPY W/ OATS PROCEDURE     Lt knee (9/01), Rt knee (3/11), Lt hip (5/10)   LEFT HEART CATH AND CORONARY ANGIOGRAPHY N/A 03/31/2018   Procedure: LEFT HEART CATH AND CORONARY ANGIOGRAPHY;  Surgeon: Wellington Hampshire, MD;  Location: [place] [facility];  Service: Cardiovascular;  Laterality: N/A;   TOTAL HIP ARTHROPLASTY       reports that she has quit smoking. Her smoking use included cigarettes. She has never used smokeless tobacco. She reports that she does not drink alcohol or use drugs.  Allergies  Allergen Reactions   Beta Adrenergic Blockers Shortness Of Breath    DEPRESSION/HYPOTENSION   Brilinta [Ticagrelor] Shortness Of Breath    Caused AFIB   Albuterol     rigors   Antihistamines, Diphenhydramine-Type     Muscle spasms   Aspirin Other (See Comments)    Heartburn    Nsaids Other (See Comments)    ULCER HISTORY & CURRENTLY ON BLOOD THINNERS   Penicillins Hives and Other (See Comments)    Has  patient had a PCN reaction causing immediate rash, facial/tongue/throat swelling, SOB or lightheadedness with hypotension: No Has patient had a PCN reaction causing severe rash involving mucus membranes or skin necrosis: No Has patient had a PCN reaction that required hospitalization No Has patient had a PCN reaction occurring within the last 10 years: No If all of the above answers are "NO", then may proceed with Cephalosporin use.    Statins Other (See Comments)    Muscle weakness   Sulfa Antibiotics Rash    At mouth   Sulfasalazine Rash    At mouth   Tetracycline Rash   Tetracyclines & Related Rash    Family History  Problem Relation Age of Onset   Thyroid disease Mother        graves disease  Heart disease Father        rheumatic heart   Alcohol abuse Father    Depression Father    Lung cancer Sister    Depression Daughter    Thyroid disease Daughter        hashimoto   Depression Son    Stroke Maternal Grandmother    Depression Maternal Grandmother    Hypertension Maternal Grandfather    Hypertension Paternal Grandfather    Seizures Son    Breast cancer Neg Hx      Prior to Admission medications   Medication Sig Start Date End Date Taking? Authorizing Provider  acetaminophen (TYLENOL) 500 MG tablet Take 1,000 mg by mouth 2 (two) times daily.    Yes [provider]  amitriptyline (ELAVIL) 25 MG tablet Take 25 mg by mouth at bedtime 09/18/19  Yes [name], MD  apixaban (ELIQUIS) 5 MG TABS tablet TAKE 1 TABLET(5 MG) BY MOUTH TWICE DAILY 03/09/20  Yes Gollan, Kathlene November, MD  b complex vitamins tablet Take 1 tablet by mouth daily.   Yes [provider]  Biotin w/ Vitamins C & E 1250-7.5-7.5 MCG-MG-UNT CHEW Chew by mouth.   Yes [provider]  Cholecalciferol (VITAMIN D3) 2000 UNITS TABS Take 2,000 Units by mouth 2 (two) times a week.    Yes [provider]  clopidogrel (PLAVIX) 75 MG tablet TAKE 1 TABLET(75 MG)  BY MOUTH DAILY WITH BREAKFAST 04/20/19  Yes Gollan, Kathlene November, MD  clotrimazole (MYCELEX) 10 MG troche SMARTSIG:1 Lozenge(s) By Mouth 5 Times Daily 03/08/20  Yes [provider]  Coenzyme Q10 (COQ-10) 200 MG CAPS Take 200 mg by mouth daily.    Yes [provider]  conjugated estrogens (PREMARIN) vaginal cream Apply 0.5mg  (pea-sized amount)  just inside the vaginal introitus with a finger-tip on  Monday, Wednesday and Friday nights. Patient taking differently: as needed. Apply 0.5mg  (pea-sized amount)  just inside the vaginal introitus with a finger-tip on  Monday, Wednesday and Friday nights. 06/24/18  Yes McGowan, Larene [place] A, PA-C  CRANBERRY PO Take 1 capsule by mouth daily.   Yes [provider]  ezetimibe (ZETIA) 10 MG tablet TAKE 1 TABLET(10 MG) BY MOUTH DAILY 02/02/20  Yes Gollan, Kathlene November, MD  furosemide (LASIX) 40 MG tablet Take 1 tablet (40 mg total) by mouth 2 (two) times daily. 02/22/20  Yes Minna Merritts, MD  hydrocortisone 2.5 % cream Apply to VULVA at bedtime as needed with miconazole for lichen planus 16/1/09  Yes [name], MD  loratadine (CLARITIN) 10 MG tablet Take 10 mg by mouth daily.   Yes [provider]  Multiple Vitamins-Minerals (HAIR/SKIN/NAILS PO) Take 1 tablet by mouth daily.   Yes [provider]  mupirocin ointment (BACTROBAN) 2 % Apply to affected area bid Patient taking differently: Apply to affected area bid as needed 11/11/18  Yes [name], MD  pantoprazole (PROTONIX) 40 MG tablet TAKE 1 TABLET(40 MG) BY MOUTH TWICE DAILY 02/29/20  Yes [name], MD  polyethylene glycol powder (GLYCOLAX/MIRALAX) powder Take 8.5 g by mouth daily as needed.    Yes [provider]  PREVIDENT 5000 BOOSTER PLUS 1.1 % PSTE USE TID AFTER MEALS . DO NOT RINSE 07/03/19  Yes [provider]  ramipril (ALTACE) 5 MG capsule TAKE 1 CAPSULE(5 MG) BY MOUTH DAILY 10/15/19  Yes Dunn, Ryan M, PA-C  REPATHA SURECLICK 604 MG/ML  SOAJ INJECT 1 PEN AS DIRECTED EVERY 14 DAYS 02/23/20  Yes Minna Merritts, MD  spironolactone (ALDACTONE) 25 MG tablet TAKE 1 TABLET(25 MG) BY MOUTH EVERY MORNING 04/13/19  Yes Gollan, Kathlene November, MD  traMADol (ULTRAM) 50 MG tablet TAKE 1 TABLET(50 MG) BY MOUTH TWICE DAILY AS NEEDED 02/17/20  Yes [name], MD  traZODone (DESYREL) 150 MG tablet Take 1 tablet to  1and 1/3 tablets q hs 07/23/19  Yes [name], MD  verapamil (CALAN-SR) 180 MG CR tablet TAKE 1 TABLET(180 MG) BY MOUTH TWICE DAILY 05/04/19  Yes Gollan, Kathlene November, MD  vitamin B-12 (CYANOCOBALAMIN) 1000 MCG tablet Take 1,000 mcg by mouth daily.   Yes [provider]  vitamin C (ASCORBIC ACID) 500 MG tablet Take 500 mg by mouth at bedtime.   Yes [provider]    Physical Exam: Vitals:   03/16/20 0917 03/16/20 0936 03/16/20 1258  BP: (!) 135/93  131/86  Pulse: 93  73  Resp: 20  20  Temp: 98.2 F (36.8 C)    TempSrc: Oral    SpO2: (!) 20% 96% 98%  Weight:  83 kg   Height:  5\' 3"  (1.6 m)      Vitals:   03/16/20 0917 03/16/20 0936 03/16/20 1258  BP: (!) 135/93  131/86  Pulse: 93  73  Resp: 20  20  Temp: 98.2 F (36.8 C)    TempSrc: Oral    SpO2: (!) 20% 96% 98%  Weight:  83 kg   Height:  5\' 3"  (1.6 m)     Constitutional: NAD, alert and oriented x 3. Frail Eyes: PERRL, lids and conjunctivae pallor ENMT: Mucous membranes are moist.  Neck: normal, supple, no masses, no thyromegaly Respiratory: clear to auscultation bilaterally, no wheezing, no crackles. Normal respiratory effort. No accessory muscle use.  Cardiovascular: Regular rate and rhythm,no murmurs / rubs / gallops. No extremity edema. 2+ pedal pulses. No carotid bruits.  Abdomen: no tenderness, no masses palpated. No hepatosplenomegaly. Bowel sounds positive.  Musculoskeletal: no clubbing / cyanosis. No joint deformity upper and lower extremities.  Skin: no rashes, lesions, ulcers.  Neurologic: No gross focal neurologic  deficit. Psychiatric: Normal mood and affect.   Labs on Admission: I have personally reviewed following labs and imaging studies  CBC: Recent Labs  Lab 03/16/20 0943  WBC 4.5  NEUTROABS 3.1  HGB 12.1  HCT 36.6  MCV 100.8*  PLT 154   Basic Metabolic Panel: Recent Labs  Lab 03/16/20 0943  NA 136  K 3.9  CL 96*  CO2 27  GLUCOSE 123*  BUN 22  CREATININE 1.10*  CALCIUM 9.7   GFR: Estimated Creatinine Clearance: 40.9 mL/min (A) (by C-G formula based on SCr of 1.1 mg/dL (H)). Liver Function Tests: Recent Labs  Lab 03/16/20 0943  AST 27  ALT 20  ALKPHOS 69  BILITOT 0.9  PROT 6.9  ALBUMIN 4.4   No results for input(s): LIPASE, AMYLASE in the last 168 hours. No results for input(s): AMMONIA in the last 168 hours. Coagulation Profile: No results for input(s): INR, PROTIME in the last 168 hours. Cardiac Enzymes: No results for input(s): CKTOTAL, CKMB, CKMBINDEX, TROPONINI in the last 168 hours. BNP (last 3 results) No results for input(s): PROBNP in the last 8760 hours. HbA1C: No results for input(s): HGBA1C in the last 72 hours. CBG: No results for input(s): GLUCAP in the last 168 hours. Lipid Profile: No results for input(s): CHOL, HDL, LDLCALC, TRIG, CHOLHDL, LDLDIRECT in the last 72 hours. Thyroid Function Tests: No results for input(s): TSH, T4TOTAL, FREET4, T3FREE, THYROIDAB in the  last 72 hours. Anemia Panel: No results for input(s): VITAMINB12, FOLATE, FERRITIN, TIBC, IRON, RETICCTPCT in the last 72 hours. Urine analysis:    Component Value Date/Time   COLORURINE YELLOW (A) 03/16/2020 0943   APPEARANCEUR HAZY (A) 03/16/2020 0943   APPEARANCEUR Clear 06/24/2018 1205   LABSPEC 1.004 (L) 03/16/2020 0943   PHURINE 8.0 03/16/2020 [place] 03/16/2020 0943   GLUCOSEU NEGATIVE 03/18/2018 1542   HGBUR NEGATIVE 03/16/2020 0943   BILIRUBINUR NEGATIVE 03/16/2020 0943   BILIRUBINUR Negative 06/24/2018 1205   KETONESUR NEGATIVE 03/16/2020 0943    PROTEINUR NEGATIVE 03/16/2020 0943   UROBILINOGEN 1.0 03/18/2018 1542   NITRITE NEGATIVE 03/16/2020 0943   LEUKOCYTESUR NEGATIVE 03/16/2020 0943    Radiological Exams on Admission: No results found.  EKG: Independently reviewed.   Assessment/Plan Principal Problem:   Acute GI bleeding Active Problems:   CAD (coronary artery disease)   GERD (gastroesophageal reflux disease)   Lichen planus   Depression   Atrial fibrillation (HCC)   Chronic diastolic heart failure ([place])    Acute GI bleed Unclear etiology ?? Diverticular bleed Patient has had multiple episodes of rust colored stools in the ER Hemoglobin is 12g/dl We will obtain serial H&H We will type and crossmatch and will transfuse for hemoglobin less than 8g/dl We will request GI consult Patient was on Eliquis Plavix which will be placed on hold   History of coronary artery disease status post CABG Stable Hold beta-blockers for now since patient is normotensive    History of atrial fibrillation on long-term anticoagulation therapy Patient is rate controlled Hold Eliquis for now   Chronic diastolic dysfunction CHF\ Hold diuretics Judicious IV fluid hydration Monitor closely for signs and symptoms of fluid overload  DVT prophylaxis: SCD Code Status: DO NOT RESUSCITATE Family Communication: Greater than 50% of time spent discussing plan of care with patient at the bedside, all questions and concerns have been addressed, she verbalizes understanding and agrees with plan. Disposition Plan: Back to previous home environment Consults called: GI    Nicki Furlan MD [facility]     03/16/2020, 1:13 PM

## 2020-03-16 NOTE — Telephone Encounter (Signed)
EMT are currently with patient to take her to ED.   [place] [facility] [place] TELEPHONE ADVICE RECORD Access[facility]urse Patient Name: Charlotte Wagner Gender: Female DOB: [birth year] Age: 84 Y 60 M 15 D Return Phone Number: [phone] (Primary), [phone] (Secondary) Address: City/State/Zip: [place] [address] Client [facility] [place] [facility] Client Site [place] - Day Physician [name] - MD Contact Type Call Who Is Calling Patient / Member / Family / Caregiver Call Type Triage / Clinical Relationship To Patient Self Return Phone Number [phone] (Primary) Chief Complaint Rectal Bleeding Reason for Call Symptomatic / Request for [place] states she has rust colored stool, pain, and wondering what to do. Rectal bleeding as well. Translation No Nurse Assessment Nurse: Gildardo Pounds, RN, Amy Date/Time (Eastern Time): 03/16/2020 8:41:43 AM Confirm and document reason for call. If symptomatic, describe symptoms. ---Caller states she has rust colored stool, rectal bleeding & abdominal pain. She is concerned about her autoimmune response. The EMT's are there checking her vitals. It started this morning. She has diarrhea. The blood is all through the stool, not just on the outside. No pain right now. Has the patient had close contact with a person known or suspected to have the novel coronavirus illness OR traveled / lives in area with major community spread (including international travel) in the last 14 days from the onset of symptoms? * If Asymptomatic, screen for exposure and travel within the last 14 days. ---No Does the patient have any new or worsening symptoms? ---Yes Will a triage be completed? ---Yes Related visit to physician within the last 2 weeks? ---No Does the PT have any chronic conditions? (i.e. diabetes, asthma, this includes High risk factors for  pregnancy, etc.) ---Yes List chronic conditions. ---autoimmune disorder- Lichen planus, diphragmatic hernia, Is this a behavioral health or substance abuse call? ---No Guidelines Guideline Title Affirmed Question Affirmed Notes Nurse Date/Time (Eastern Time) Rectal Bleeding Taking Coumadin (warfarin) or other strong blood thinner, or known Lovelace, RN, Amy 03/16/2020 8:44:03 AMPLEASE NOTE: All timestamps contained within this report are represented as [place] Standard Time. CONFIDENTIALTY NOTICE: This fax transmission is intended only for the addressee. It contains information that is legally privileged, confidential or otherwise protected from use or disclosure. If you are not the intended recipient, you are strictly prohibited from reviewing, disclosing, copying using or disseminating any of this information or taking any action in reliance on or regarding this information. If you have received this fax in error, please notify us immediately by telephone so that we can arrange for its return to [place]. Phone: [phone], Toll-Free: [phone], Fax: [phone] Page: 2 of 2 Call Id: [id number] Guidelines Guideline Title Affirmed Question Affirmed Notes Nurse Date/Time Eilene Ghazi Time) bleeding disorder (e.g., thrombocytopenia) Disp. Time Eilene Ghazi Time) Disposition Final User 03/16/2020 8:34:28 AM Attempt made - message left Lovelace, RN, Amy 03/16/2020 8:48:55 AM Go to ED Now (or PCP triage) Yes Lovelace, RN, Amy Caller Disagree/Comply Comply Caller Understands Yes PreDisposition InappropriateToAsk Care Advice Given Per Guideline GO TO ED NOW (OR PCP TRIAGE): * IF NO PCP (PRIMARY CARE PROVIDER) SECOND-LEVEL TRIAGE: You need to be seen within the next hour. Go to the [facility] at _____________ [place] as soon as you can. BRING MEDICINES: * Please bring a list of your current medicines when you go to see the doctor. * It is also a good idea to bring the pill bottles too. This will help the  doctor  to make certain you are taking the right medicines and the right dose. CARE ADVICE given per Rectal Bleeding (Adult) guideline.
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
## 2020-03-16 NOTE — ED Triage Notes (Signed)
Patient reports one episode of GI bleed this AM. Arrived by EMS from assisted living [facility]. Patient has unsteady gait

## 2020-03-16 NOTE — ED Notes (Signed)
Assisted pt to restroom. Pt stated she had black tar stools. Nt noticed stool being a dark red. Reported to Joellen Jersey, Therapist, sports.

## 2020-03-16 NOTE — Telephone Encounter (Signed)
Agree. Pt needs to go to ER.

## 2020-03-16 NOTE — Telephone Encounter (Signed)
Rust colored stool/pain in abdomen and rectum. Pt asked to be transferred to triage. Transferred to [facility] at [facility].

## 2020-03-17 ENCOUNTER — Encounter: Payer: Self-pay | Admitting: Anesthesiology

## 2020-03-17 ENCOUNTER — Other Ambulatory Visit: Payer: Self-pay | Admitting: Internal Medicine

## 2020-03-17 ENCOUNTER — Encounter
Admission: EM | Disposition: A | Discharge status: Home Health | Payer: Self-pay | Source: Home / Self Care | Attending: Internal Medicine

## 2020-03-17 DIAGNOSIS — I4819 Other persistent atrial fibrillation: Secondary | ICD-10-CM

## 2020-03-17 DIAGNOSIS — I5032 Chronic diastolic (congestive) heart failure: Secondary | ICD-10-CM

## 2020-03-17 LAB — URINALYSIS, COMPLETE (UACMP) WITH MICROSCOPIC
Bacteria, UA: NONE SEEN
Bilirubin Urine: NEGATIVE
Glucose, UA: NEGATIVE mg/dL
Ketones, ur: NEGATIVE mg/dL
Nitrite: NEGATIVE
Protein, ur: NEGATIVE mg/dL
Specific Gravity, Urine: 1.004 — ABNORMAL LOW (ref 1.005–1.030)
pH: 7 (ref 5.0–8.0)

## 2020-03-17 LAB — CBC
HCT: 33.4 % — ABNORMAL LOW (ref 36.0–46.0)
Hemoglobin: 11.1 g/dL — ABNORMAL LOW (ref 12.0–15.0)
MCH: 33.9 pg (ref 26.0–34.0)
MCHC: 33.2 g/dL (ref 30.0–36.0)
MCV: 102.1 fL — ABNORMAL HIGH (ref 80.0–100.0)
Platelets: 228 10*3/uL (ref 150–400)
RBC: 3.27 MIL/uL — ABNORMAL LOW (ref 3.87–5.11)
RDW: 13.1 % (ref 11.5–15.5)
WBC: 4.7 10*3/uL (ref 4.0–10.5)
nRBC: 0 % (ref 0.0–0.2)

## 2020-03-17 LAB — BASIC METABOLIC PANEL
Anion gap: 8 (ref 5–15)
BUN: 17 mg/dL (ref 8–23)
CO2: 27 mmol/L (ref 22–32)
Calcium: 9.2 mg/dL (ref 8.9–10.3)
Chloride: 102 mmol/L (ref 98–111)
Creatinine, Ser: 0.93 mg/dL (ref 0.44–1.00)
GFR calc Af Amer: >60 mL/min (ref >60)
GFR calc non Af Amer: 58 mL/min — ABNORMAL LOW (ref >60)
Glucose, Bld: 98 mg/dL (ref 70–99)
Potassium: 3.4 mmol/L — ABNORMAL LOW (ref 3.5–5.1)
Sodium: 137 mmol/L (ref 135–145)

## 2020-03-17 LAB — HEMOGLOBIN AND HEMATOCRIT, BLOOD
HCT: 31.5 % — ABNORMAL LOW (ref 36.0–46.0)
Hemoglobin: 10.6 g/dL — ABNORMAL LOW (ref 12.0–15.0)

## 2020-03-17 LAB — TROPONIN I (HIGH SENSITIVITY)
Troponin I (High Sensitivity): 6 ng/L (ref <18)
Troponin I (High Sensitivity): 5 ng/L (ref <18)

## 2020-03-17 LAB — URINE CULTURE: Culture: <10000 — AB

## 2020-03-17 SURGERY — EGD (ESOPHAGOGASTRODUODENOSCOPY)
Anesthesia: Monitor Anesthesia Care

## 2020-03-17 MED ORDER — VERAPAMIL HCL ER 120 MG PO TBCR
120.0000 mg | EXTENDED_RELEASE_TABLET | Freq: Two times a day (BID) | ORAL | Status: DC
  Administered 2020-03-17: 120 mg via ORAL
  Filled 2020-03-17 (×2): qty 1

## 2020-03-17 MED ORDER — EZETIMIBE 10 MG PO TABS
10.0000 mg | ORAL_TABLET | Freq: Every day | ORAL | Status: DC
  Administered 2020-03-17 – 2020-03-18 (×2): 10 mg via ORAL
  Filled 2020-03-17 (×3): qty 1

## 2020-03-17 MED ORDER — AMITRIPTYLINE HCL 25 MG PO TABS
25.0000 mg | ORAL_TABLET | Freq: Every day | ORAL | Status: DC
  Administered 2020-03-17 – 2020-03-18 (×2): 25 mg via ORAL
  Filled 2020-03-17 (×3): qty 1

## 2020-03-17 MED ORDER — ALUM & MAG HYDROXIDE-SIMETH 200-200-20 MG/5ML PO SUSP
30.0000 mL | Freq: Four times a day (QID) | ORAL | Status: DC | PRN
  Administered 2020-03-17: 30 mL via ORAL
  Filled 2020-03-17: qty 30

## 2020-03-17 MED ORDER — PANTOPRAZOLE SODIUM 40 MG IV SOLR
40.0000 mg | Freq: Two times a day (BID) | INTRAVENOUS | Status: DC
  Administered 2020-03-17 – 2020-03-18 (×3): 40 mg via INTRAVENOUS
  Filled 2020-03-17 (×3): qty 40

## 2020-03-17 MED ORDER — PANTOPRAZOLE SODIUM 40 MG IV SOLR
40.0000 mg | Freq: Once | INTRAVENOUS | Status: AC
  Administered 2020-03-17: 40 mg via INTRAVENOUS
  Filled 2020-03-17: qty 40

## 2020-03-17 MED ORDER — PEG 3350-KCL-NA BICARB-NACL 420 G PO SOLR
4000.0000 mL | Freq: Once | ORAL | Status: AC
  Administered 2020-03-18: 4000 mL via ORAL
  Filled 2020-03-17: qty 4000

## 2020-03-17 MED ORDER — VERAPAMIL HCL ER 180 MG PO TBCR
180.0000 mg | EXTENDED_RELEASE_TABLET | Freq: Two times a day (BID) | ORAL | Status: DC
  Administered 2020-03-17 – 2020-03-19 (×4): 180 mg via ORAL
  Filled 2020-03-17 (×5): qty 1

## 2020-03-17 MED ORDER — ACETAMINOPHEN 500 MG PO TABS
1000.0000 mg | ORAL_TABLET | Freq: Two times a day (BID) | ORAL | Status: DC
  Administered 2020-03-17 – 2020-03-19 (×5): 1000 mg via ORAL
  Filled 2020-03-17 (×5): qty 2

## 2020-03-17 MED ORDER — CALCIUM CARBONATE ANTACID 500 MG PO CHEW
1.0000 | CHEWABLE_TABLET | Freq: Two times a day (BID) | ORAL | Status: DC | PRN
  Administered 2020-03-17 – 2020-03-19 (×2): 200 mg via ORAL
  Filled 2020-03-17 (×2): qty 1

## 2020-03-17 MED ORDER — CALCIUM CARBONATE ANTACID 500 MG PO CHEW
1.0000 | CHEWABLE_TABLET | Freq: Once | ORAL | Status: AC
  Administered 2020-03-17: 200 mg via ORAL
  Filled 2020-03-17: qty 1

## 2020-03-17 MED ORDER — PEG 3350-KCL-NA BICARB-NACL 420 G PO SOLR
4000.0000 mL | Freq: Once | ORAL | Status: DC
  Filled 2020-03-17: qty 4000

## 2020-03-17 MED ORDER — CRANBERRY 250 MG PO TABS: ORAL_TABLET | Freq: Every day | ORAL | Status: DC

## 2020-03-17 MED ORDER — TRAMADOL HCL 50 MG PO TABS
50.0000 mg | ORAL_TABLET | Freq: Two times a day (BID) | ORAL | Status: DC | PRN
  Administered 2020-03-17: 50 mg via ORAL
  Filled 2020-03-17: qty 1

## 2020-03-17 MED ORDER — DEXTROSE IN LACTATED RINGERS 5 % IV SOLN: INTRAVENOUS | Status: AC

## 2020-03-17 MED ORDER — TRAZODONE HCL 50 MG PO TABS
150.0000 mg | ORAL_TABLET | Freq: Every day | ORAL | Status: DC
  Administered 2020-03-17: 150 mg via ORAL
  Filled 2020-03-17 (×2): qty 1

## 2020-03-17 MED ORDER — PANTOPRAZOLE SODIUM 40 MG PO TBEC: 40.0000 mg | DELAYED_RELEASE_TABLET | Freq: Two times a day (BID) | ORAL | Status: DC

## 2020-03-17 NOTE — Progress Notes (Signed)
PHARMACIST - PHYSICIAN ORDER COMMUNICATION  CONCERNING: P&T Medication Policy on Herbal Medications  DESCRIPTION:  This patient's order for:  Cranberry tabs  has been noted.  This product(s) is classified as an "herbal" or natural product. Due to a lack of definitive safety studies or FDA approval, nonstandard manufacturing practices, plus the potential risk of unknown drug-drug interactions while on inpatient medications, the Pharmacy and Therapeutics Committee does not permit the use of "herbal" or natural products of this type within [facility].   ACTION TAKEN: The pharmacy department is unable to verify this order at this time and your patient has been informed of this safety policy. Please reevaluate patient's clinical condition at discharge and address if the herbal or natural product(s) should be resumed at that time.

## 2020-03-17 NOTE — Progress Notes (Signed)
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  Patient has lichen planus in her mouth and vulvar area and is concerned that it has extended into her rectum resulting in rectal bleeding.  Patient had one episode of rectal bleeding at the assisted living facility but had multiple episodes in the emergency room that was witnessed by her nurse.  Stools are described as rust colored. Labs reveal hemoglobin of 12.1g/dl.  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  Patient had requested transfer to [facility] but there are no beds available at this time so she will be admitted here at [facility] with GI consult  Subjective:  Continue to have blood in the stool,  Complaining of heartburn  Assessment & Plan:   Principal Problem:   Acute GI bleeding Active Problems:   CAD (coronary artery disease)   GERD (gastroesophageal reflux disease)   Lichen planus   Depression   Atrial fibrillation (HCC)   Chronic diastolic heart failure (HCC)  GI bleed Hemoglobin on presentation is 12 this morning is 11 IV PPI twice a day Last dose of Eliquis and plavix on June 9, clear liquid diet today, EGD and colonoscopy expected in 2 days will follow GI recommendation  PAF Continue verapamil with holding parameters, hold Eliquis  CAD status post CABG, chronic combined CHF, last EF 30 to 35% Continue Zetia Appears dry, hold Lasix and  spironolactone, change IV fluids from normal saline 100 cc/h to D5 LR at 50 cc/h, close monitor volume status Hold Plavix Hold ramipril   DVT prophylaxis: SCDs Code Status: DNR Family Communication: Patient Disposition:   Status is: Inpatient     Dispo: The patient is from: Patient from [facility] assisted [facility]               Anticipated d/c is to:               Anticipated d/c date is:               Patient currently   Consultants:   GI  Procedures:   None  Antimicrobials:   None     Objective: Vitals:   03/16/20 2300 03/16/20 2330 03/17/20 0100 03/17/20 0439  BP: 122/72 (!) 111/52 140/81 126/62  Pulse: 81 80 93 70  Resp:  16  16  Temp:   98.1 F (36.7 C) 98.4 F (36.9 C)  TempSrc:   Oral Oral  SpO2: 95% 94% 98% 98%  Weight:   79.8 kg   Height:   5\' 3"  (1.6 m)     Intake/Output Summary (Last 24 hours) at 03/17/2020 0824 Last data filed at 03/17/2020 0300 Gross per 24 hour  Intake 631.6 ml  Output 1200 ml  Net -568.4 ml   Filed Weights   03/16/20 0936 03/17/20 0100  Weight: 83 kg 79.8 kg    Examination:  General exam: calm, NAD, pleasant Respiratory system: Clear to auscultation. Respiratory  effort normal. Cardiovascular system:  IRRR. No JVD, no murmur, No pedal edema. Gastrointestinal system: Abdomen is nondistended, soft and nontender. No organomegaly or masses felt. Normal bowel sounds heard. Central nervous system: Alert and oriented. No focal neurological deficits. Extremities: Symmetric 5 x 5 power. Skin: No rashes, lesions or ulcers Psychiatry: Judgement and insight appear normal. Mood & affect appropriate.     Data Reviewed: I have personally reviewed following labs and imaging studies  CBC: Recent Labs  Lab 03/16/20 0943 03/16/20 2011 03/17/20 0527  WBC 4.5  --  4.7  NEUTROABS 3.1  --   --   HGB 12.1 12.1 11.1*  HCT 36.6 36.3 33.4*  MCV 100.8*  --  102.1*  PLT 228  --  875    Basic Metabolic Panel: Recent Labs  Lab  03/16/20 0943 03/17/20 0527  NA 136 137  K 3.9 3.4*  CL 96* 102  CO2 27 27  GLUCOSE 123* 98  BUN 22 17  CREATININE 1.10* 0.93  CALCIUM 9.7 9.2    GFR: Estimated Creatinine Clearance: 47.5 mL/min (by C-G formula based on SCr of 0.93 mg/dL).  Liver Function Tests: Recent Labs  Lab 03/16/20 0943  AST 27  ALT 20  ALKPHOS 69  BILITOT 0.9  PROT 6.9  ALBUMIN 4.4    CBG: No results for input(s): GLUCAP in the last 168 hours.   Recent Results (from the past 240 hour(s))  SARS Coronavirus 2 by RT PCR (hospital order, performed in [facility] hospital lab) Nasopharyngeal Nasopharyngeal Swab     Status: None   Collection Time: 03/16/20  1:02 PM   Specimen: Nasopharyngeal Swab  Result Value Ref Range Status   SARS Coronavirus 2 NEGATIVE NEGATIVE Final    Comment: (NOTE) SARS-CoV-2 target nucleic acids are NOT DETECTED. The SARS-CoV-2 RNA is generally detectable in upper and lower respiratory specimens during the acute phase of infection. The lowest concentration of SARS-CoV-2 viral copies this assay can detect is 250 copies / mL. A negative result does not preclude SARS-CoV-2 infection and should not be used as the sole basis for treatment or other patient management decisions.  A negative result may occur with improper specimen collection / handling, submission of specimen other than nasopharyngeal swab, presence of viral mutation(s) within the areas targeted by this assay, and inadequate number of viral copies (<250 copies / mL). A negative result must be combined with clinical observations, patient history, and epidemiological information. Fact Sheet for Patients:   StrictlyIdeas.no Fact Sheet for Healthcare Providers: BankingDealers.co.za This test is not yet approved or cleared  by the [place] FDA and has been authorized for detection and/or diagnosis of SARS-CoV-2 by FDA under an Emergency Use Authorization (EUA).   This EUA will remain in effect (meaning this test can be used) for the duration of the COVID-19 declaration under Section 564(b)(1) of the Act, 21 U.S.C. section 360bbb-3(b)(1), unless the authorization is terminated or revoked sooner. Performed at [facility], [address], [place], [place] [address]          Radiology Studies: No results found.      Scheduled Meds: . pantoprazole (PROTONIX) IV  40 mg Intravenous Q24H   Continuous Infusions: . sodium chloride 100 mL/hr at 03/17/20 0300     LOS: 1 day     Time spent: 18mins I have personally reviewed and interpreted on  03/17/2020 daily labs, tele strips, imagings as discussed above under date review session and assessment and plans.  I reviewed  all nursing notes, pharmacy notes, consultant notes,  vitals, pertinent old records  I have discussed plan of care as described above with RN , patient  on 03/17/2020  Voice Recognition /Dragon dictation system was used to create this note, attempts have been made to correct errors. Please contact the author with questions and/or clarifications.   Florencia Reasons, MD PhD FACP [facility]  Available via Epic secure chat 7am-7pm for nonurgent issues Please page for urgent issues To page the attending provider between 7A-7P or the covering provider during after hours 7P-7A, please log into the web site [email or web address] and access using universal [place] password for that web site. If you do not have the password, please call the hospital operator.    03/17/2020, 8:24 AM

## 2020-03-17 NOTE — Progress Notes (Addendum)
The patient notified Dr. Alice Reichert of 2 bowel movement mixed with dark blood. Consent for EGD & colonoscopy has been placed in chart. EKG, Troponin lab obtained for heart burn. Tums and Maalox ordered. On full liquids diet currently. Bowel prep reorder for tomorrow 6/11 to be started at 1600. Per MD patient to be on clear liquid diet tomorrow. UA and urine cultured order as well and to be collected at night shift when the patient voids. The patient is aware of urine samples needed.

## 2020-03-17 NOTE — Progress Notes (Signed)
[facility] Inpatient Progress Note  Subjective: Patient seen for follow up of GI bleeding, anemia. Patient Hgb dropped to 11.1 from 12.5 yesterday. Bleeding not regular, but had one small "black stool" this AM.  Now complains of "heartburn" and epigastric discomfort, nausea.  Objective: Vital signs in last 24 hours: Temp:  [98.1 F (36.7 C)-98.7 F (37.1 C)] 98.7 F (37.1 C) (06/10 1443) Pulse Rate:  [69-93] 69 (06/10 1443) Resp:  [16-18] 18 (06/10 1443) BP: (109-140)/(52-83) 110/56 (06/10 1443) SpO2:  [94 %-98 %] 98 % (06/10 1443) Weight:  [79.8 kg] 79.8 kg (06/10 0100) Blood pressure (!) 110/56, pulse 69, temperature 98.7 F (37.1 C), temperature source Oral, resp. rate 18, height 5\' 3"  (1.6 m), weight 79.8 kg, SpO2 98 %.    Intake/Output from previous day: 06/09 0701 - 06/10 0700 In: 631.6 [I.V.:631.6] Out: 1200 [Urine:1200]  Intake/Output this shift: Total I/O In: 480 [P.O.:480] Out: 300 [Urine:300]   General appearance:  Alert, NAD Resp:  Some coarse rhonchi Cardio: RRR GI:  Soft, minimal tenderness in epigastrium Extremities: Trace edema.   Lab Results: Results for orders placed or performed during the hospital encounter of 03/16/20 (from the past 24 hour(s))  Hemoglobin and hematocrit, blood     Status: None   Collection Time: 03/16/20  8:11 PM  Result Value Ref Range   Hemoglobin 12.1 12.0 - 15.0 g/dL   HCT 36.3 36 - 46 %  Basic metabolic panel     Status: Abnormal   Collection Time: 03/17/20  5:27 AM  Result Value Ref Range   Sodium 137 135 - 145 mmol/L   Potassium 3.4 (L) 3.5 - 5.1 mmol/L   Chloride 102 98 - 111 mmol/L   CO2 27 22 - 32 mmol/L   Glucose, Bld 98 70 - 99 mg/dL   BUN 17 8 - 23 mg/dL   Creatinine, Ser 0.93 0.44 - 1.00 mg/dL   Calcium 9.2 8.9 - 10.3 mg/dL   GFR calc non Af Amer 58 (L) >60 mL/min   GFR calc Af Amer >60 >60 mL/min   Anion gap 8 5 - 15  CBC     Status: Abnormal   Collection Time: 03/17/20  5:27 AM   Result Value Ref Range   WBC 4.7 4.0 - 10.5 K/uL   RBC 3.27 (L) 3.87 - 5.11 MIL/uL   Hemoglobin 11.1 (L) 12.0 - 15.0 g/dL   HCT 33.4 (L) 36 - 46 %   MCV 102.1 (H) 80.0 - 100.0 fL   MCH 33.9 26.0 - 34.0 pg   MCHC 33.2 30.0 - 36.0 g/dL   RDW 13.1 11.5 - 15.5 %   Platelets 228 150 - 400 K/uL   nRBC 0.0 0.0 - 0.2 %     Recent Labs    03/16/20 0943 03/16/20 2011 03/17/20 0527  WBC 4.5  --  4.7  HGB 12.1 12.1 11.1*  HCT 36.6 36.3 33.4*  PLT 228  --  228   BMET Recent Labs    03/16/20 0943 03/17/20 0527  NA 136 137  K 3.9 3.4*  CL 96* 102  CO2 27 27  GLUCOSE 123* 98  BUN 22 17  CREATININE 1.10* 0.93  CALCIUM 9.7 9.2   LFT Recent Labs    03/16/20 0943  PROT 6.9  ALBUMIN 4.4  AST 27  ALT 20  ALKPHOS 69  BILITOT 0.9   PT/INR No results for input(s): LABPROT, INR in the last 72 hours. Hepatitis Panel No results for  input(s): HEPBSAG, HCVAB, HEPAIGM, HEPBIGM in the last 72 hours. C-Diff No results for input(s): CDIFFTOX in the last 72 hours. No results for input(s): CDIFFPCR in the last 72 hours.   Studies/Results: No results found.  Scheduled Inpatient Medications:   . acetaminophen  1,000 mg Oral BID  . amitriptyline  25 mg Oral QHS  . ezetimibe  10 mg Oral QHS  . pantoprazole (PROTONIX) IV  40 mg Intravenous Q12H  . pantoprazole (PROTONIX) IV  40 mg Intravenous Once  . [START ON 03/18/2020] polyethylene glycol-electrolytes  4,000 mL Oral Once  . traZODone  150 mg Oral QHS  . verapamil  180 mg Oral BID    Continuous Inpatient Infusions:   . dextrose 5% lactated ringers 50 mL/hr at 03/17/20 1008    PRN Inpatient Medications:  alum & mag hydroxide-simeth, hydrocortisone cream, ondansetron **OR** ondansetron (ZOFRAN) IV, traMADol    Assessment:  1. Melena. Stable. 2. Epigastric pain - New. Suggests possible PUD. 3. Anemia secondary to GI blood loss. Procedures canceled today due to no bowel prep given by floor night shift nursing Almyra Free, RN?)  as per orders.  4. Colonic diverticulosis.  Plan:  1. Continue pantoprazole. 2. Full liquids tonight. Prep again for EGD and colonoscopy. 3. Serial H/H.  4. Following.  Zaylee Cornia K. Alice Reichert, M.D. 03/17/2020, 4:04 PM

## 2020-03-18 LAB — CBC
HCT: 32 % — ABNORMAL LOW (ref 36.0–46.0)
Hemoglobin: 10.4 g/dL — ABNORMAL LOW (ref 12.0–15.0)
MCH: 33.7 pg (ref 26.0–34.0)
MCHC: 32.5 g/dL (ref 30.0–36.0)
MCV: 103.6 fL — ABNORMAL HIGH (ref 80.0–100.0)
Platelets: 200 10*3/uL (ref 150–400)
RBC: 3.09 MIL/uL — ABNORMAL LOW (ref 3.87–5.11)
RDW: 12.8 % (ref 11.5–15.5)
WBC: 4.2 10*3/uL (ref 4.0–10.5)
nRBC: 0 % (ref 0.0–0.2)

## 2020-03-18 LAB — BASIC METABOLIC PANEL
Anion gap: 5 (ref 5–15)
BUN: 8 mg/dL (ref 8–23)
CO2: 27 mmol/L (ref 22–32)
Calcium: 9.1 mg/dL (ref 8.9–10.3)
Chloride: 105 mmol/L (ref 98–111)
Creatinine, Ser: 0.82 mg/dL (ref 0.44–1.00)
GFR calc Af Amer: >60 mL/min (ref >60)
GFR calc non Af Amer: >60 mL/min (ref >60)
Glucose, Bld: 122 mg/dL — ABNORMAL HIGH (ref 70–99)
Potassium: 3.6 mmol/L (ref 3.5–5.1)
Sodium: 137 mmol/L (ref 135–145)

## 2020-03-18 LAB — MAGNESIUM: Magnesium: 2 mg/dL (ref 1.7–2.4)

## 2020-03-18 LAB — HEMOGLOBIN AND HEMATOCRIT, BLOOD
HCT: 34.1 % — ABNORMAL LOW (ref 36.0–46.0)
Hemoglobin: 11.7 g/dL — ABNORMAL LOW (ref 12.0–15.0)

## 2020-03-18 MED ORDER — TRAZODONE HCL 100 MG PO TABS: 100.0000 mg | ORAL_TABLET | Freq: Every day | ORAL | Status: DC

## 2020-03-18 MED ORDER — TRAMADOL HCL 50 MG PO TABS
50.0000 mg | ORAL_TABLET | Freq: Three times a day (TID) | ORAL | Status: DC | PRN
  Administered 2020-03-18 – 2020-03-19 (×2): 50 mg via ORAL
  Filled 2020-03-18 (×2): qty 1

## 2020-03-18 MED ORDER — TRAZODONE HCL 100 MG PO TABS
100.0000 mg | ORAL_TABLET | Freq: Once | ORAL | Status: AC
  Administered 2020-03-18: 100 mg via ORAL

## 2020-03-18 NOTE — Progress Notes (Signed)
GI Inpatient Follow-up Note  Subjective:  Patient seen in follow-up for GI bleeding and anemia. No acute events overnight. Hemoglobin this morning 10.4 and recheck this afternoon was 11.7. She had one BM last night which was black per pt report. She does still endorse mild epigastric pain and mild nausea. She has started bowel prep and tolerating well. No BMs today. No fever, chills, vomiting, or lower abdominal pain. No new complaints.   Scheduled Inpatient Medications:  . acetaminophen  1,000 mg Oral BID  . amitriptyline  25 mg Oral QHS  . ezetimibe  10 mg Oral QHS  . pantoprazole (PROTONIX) IV  40 mg Intravenous Q12H  . polyethylene glycol-electrolytes  4,000 mL Oral Once  . traZODone  150 mg Oral QHS  . verapamil  180 mg Oral BID    Continuous Inpatient Infusions:    PRN Inpatient Medications:  alum & mag hydroxide-simeth, calcium carbonate, hydrocortisone cream, ondansetron **OR** ondansetron (ZOFRAN) IV, traMADol  Review of Systems: Constitutional: Weight is stable.  Eyes: No changes in vision. ENT: No oral lesions, sore throat.  GI: see HPI.  Heme/Lymph: No easy bruising.  CV: No chest pain.  GU: No hematuria.  Integumentary: No rashes.  Neuro: No headaches.  Psych: No depression/anxiety.  Endocrine: No heat/cold intolerance.  Allergic/Immunologic: No urticaria.  Resp: No cough, SOB.  Musculoskeletal: No joint swelling.    Physical Examination: BP 117/67 (BP Location: Left Arm)   Pulse 61   Temp 98.1 F (36.7 C) (Oral)   Resp 16   Ht 5\' 3"  (1.6 m)   Wt 79.8 kg   SpO2 97%   BMI 31.16 kg/m  Gen: NAD, alert and oriented x 4 HEENT: PEERLA, EOMI, Neck: supple, no JVD or thyromegaly Chest: CTA bilaterally, no wheezes, crackles, or other adventitious sounds CV: RRR, no m/g/c/r Abd: soft, NT, ND, +BS in all four quadrants; no HSM, guarding, ridigity, or rebound tenderness Ext: no edema, well perfused with 2+ pulses, Skin: no rash or lesions noted Lymph: no  LAD  Data: Lab Results  Component Value Date   WBC 4.2 03/18/2020   HGB 11.7 (L) 03/18/2020   HCT 34.1 (L) 03/18/2020   MCV 103.6 (H) 03/18/2020   PLT 200 03/18/2020   Recent Labs  Lab 03/17/20 1655 03/18/20 0422 03/18/20 1712  HGB 10.6* 10.4* 11.7*   Lab Results  Component Value Date   NA 137 03/18/2020   K 3.6 03/18/2020   CL 105 03/18/2020   CO2 27 03/18/2020   BUN 8 03/18/2020   CREATININE 0.82 03/18/2020   Lab Results  Component Value Date   ALT 20 03/16/2020   AST 27 03/16/2020   ALKPHOS 69 03/16/2020   BILITOT 0.9 03/16/2020   No results for input(s): APTT, INR, PTT in the last 168 hours.   Assessment:  1. Melena. Stable. 2. Epigastric pain - New. Suggests possible PUD. 3. Anemia secondary to GI blood loss. Hemoglobin 11.7 this afternoon. No precipitous drop in hemoglobin.  4. Colonic diverticulosis.  Plan: 1. Continue pantoprazole. 2. Clear liquid diet. NPO after midnight 3. Serial H/H.  4. EGD and colonsocopy tomorrow with Dr. Bonna Gains 5. See procedure note for findings and further recommendations  Dr. Bonna Gains will take over patient's care over the weekend  I reviewed the risks (including bleeding, perforation, infection, anesthesia complications, cardiac/respiratory complications), benefits and alternatives of EGD and colonoscopy. Patient consents to proceed.   Please call with questions or concerns.    Octavia Bruckner, PA-C [facility]  [phone] (Cell)

## 2020-03-18 NOTE — Evaluation (Signed)
Physical Therapy Evaluation Patient Details Name: Charlotte MOHIUDDIN, MD MRN: [id number] DOB: 1936-06-09 Today's Date: 03/18/2020   History of Present Illness  presented to ER secondary to rectal bleeding; admitted for management of upper GIB.  Clinical Impression  Upon evaluation, patient alert and oriented; follows commands and agreeable to session to maintain mobility while hospitalized (though insistent that she "doesn't need PT" formally).  Generally hyperverbal throughout evaluation, requiring frequent redirection to maintain topic of conversation and complete assessment.  Bilat UE/LE strength and ROM grossly symmetrical and WFL for basic transfers and mobility; no focal weakness appreciated.  Able to complete bed mobility with mod indep; sit/stand, basic transfers and gait (200') with RW, cga/min assist.  Demonstrates forward flexed posture (further deteriorating with distance) adn downward gaze, decreased step height/length bilat; decreased cadence.  Does require 2-3 standing rest breaks (propping forearms on RW) to complete distance due to back pain. Would benefit from skilled PT to address above deficits and promote optimal return to PLOF.; Recommend transition to HHPT upon discharge from acute hospitalization.     Follow Up Recommendations Home health PT    Equipment Recommendations       Recommendations for Other Services       Precautions / Restrictions Precautions Precautions: Fall Restrictions Weight Bearing Restrictions: No      Mobility  Bed Mobility Overal bed mobility: Modified Independent                Transfers Overall transfer level: Needs assistance Equipment used: Rolling walker (2 wheeled) Transfers: Sit to/from Stand Sit to Stand: Min guard         General transfer comment: slow and deliberate, but able to complete with minimal physical assist required  Ambulation/Gait Ambulation/Gait assistance: Min guard Gait Distance (Feet): 200  Feet Assistive device: Rolling walker (2 wheeled)       General Gait Details: forward flexed posture (further deteriorating with distance) adn downward gaze, decreased step height/length bilat; decreased cadence.  Does require 2-3 standing rest breaks (propping forearms on RW) to complete distance due to back pain.  Stairs            Wheelchair Mobility    Modified Rankin (Stroke Patients Only)       Balance Overall balance assessment: Needs assistance Sitting-balance support: No upper extremity supported;Feet supported Sitting balance-Leahy Scale: Good     Standing balance support: Bilateral upper extremity supported Standing balance-Leahy Scale: Fair                               Pertinent Vitals/Pain Pain Assessment: Faces Faces Pain Scale: Hurts little more Pain Location: low back Pain Descriptors / Indicators: Aching Pain Intervention(s): Limited activity within patient's tolerance;Monitored during session;Repositioned    Home Living Family/patient expects to be discharged to:: Assisted living               Home Equipment: Walker - 4 wheels      Prior Function Level of Independence: Independent with assistive device(s)         Comments: Mod indep with ADLs, household and community mobilization with [phone]; denies recent fall history.     Hand Dominance        Extremity/Trunk Assessment   Upper Extremity Assessment Upper Extremity Assessment: Overall WFL for tasks assessed    Lower Extremity Assessment Lower Extremity Assessment: Overall WFL for tasks assessed (grossly at least 4- to 4/5 throughout)  Communication   Communication: No difficulties (generally hyperverbal)  Cognition Arousal/Alertness: Awake/alert Behavior During Therapy: WFL for tasks assessed/performed Overall Cognitive Status: Within Functional Limits for tasks assessed                                        General Comments       Exercises Other Exercises Other Exercises: Toilet transfer, SPT without assist device, cga/min assist-requires UE support throughout transfer.  Sit/stand from [facility] without assist device (using bedrails, armrests), min assist-maintains broad BOS, limited balance reactions evident.   Assessment/Plan    PT Assessment Patient needs continued PT services  PT Problem List Decreased strength;Decreased activity tolerance;Decreased balance;Decreased mobility;Cardiopulmonary status limiting activity;Decreased knowledge of use of DME;Decreased safety awareness;Decreased knowledge of precautions       PT Treatment Interventions DME instruction;Gait training;Functional mobility training;Therapeutic activities;Therapeutic exercise;Balance training;Patient/family education    PT Goals (Current goals can be found in the Care Plan section)  Acute Rehab PT Goals Patient Stated Goal: [place] Goal Formulation: With patient Time For Goal Achievement: 04/01/20 Potential to Achieve Goals: Good    Frequency Min 2X/week   Barriers to discharge        Co-evaluation               AM-PAC PT "6 Clicks" Mobility  Outcome Measure Help needed turning from your back to your side while in a flat bed without using bedrails?: None Help needed moving from lying on your back to sitting on the side of a flat bed without using bedrails?: None Help needed moving to and from a bed to a chair (including a wheelchair)?: A Little Help needed standing up from a chair using your arms (e.g., wheelchair or bedside chair)?: A Little Help needed to walk in hospital room?: A Little Help needed climbing 3-5 steps with a railing? : A Little 6 Click Score: 20    End of Session Equipment Utilized During Treatment: Gait belt Activity Tolerance: Patient tolerated treatment well Patient left: with call bell/phone within reach;in bed;with bed alarm set Nurse Communication: Mobility  status PT Visit Diagnosis: Muscle weakness (generalized) (M62.81);Difficulty in walking, not elsewhere classified (R26.2)    Time: 1010-1036 PT Time Calculation (min) (ACUTE ONLY): 26 min   Charges:   PT Evaluation $PT Eval Moderate Complexity: 1 Mod PT Treatments $Therapeutic Activity: 8-22 mins        Treyvion Durkee H. Owens Shark, PT, DPT, NCS 03/18/20, 2:24 PM [phone]

## 2020-03-18 NOTE — Care Management Important Message (Signed)
Important Message  Patient Details  Name: Charlotte HOVLAND, MD MRN: [id number] Date of Birth: 15-Sep-1936   Medicare Important Message Given:  Yes     Dannette Barbara 03/18/2020, 12:14 PM
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
## 2020-03-18 NOTE — Progress Notes (Signed)
Pt requested that she have AZO (cranberry) pills. She states that she takes them at home prophylactically for her UTI's. Spoke with pharmacy who said we do not carry AZO pills d/t it being considered an herbal supplement, and that pt could bring in home supply. I spoke with pt about this, and she feels that it is malpractice. However, pt does understand, and will try to obtain her home AZO pills tomorrow.

## 2020-03-18 NOTE — Progress Notes (Signed)
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  Patient has lichen planus in her mouth and vulvar area and is concerned that it has extended into her rectum resulting in rectal bleeding.  Patient had one episode of rectal bleeding at the assisted living facility but had multiple episodes in the emergency room that was witnessed by her nurse.  Stools are described as rust colored. Labs reveal hemoglobin of 12.1g/dl.  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  Patient had requested transfer to [facility] but there are no beds available at this time so she will be admitted here at [facility] with GI consult  Subjective:  No bm today, reports chronic back pain Reports some edema in her legs   Assessment & Plan:   Principal Problem:   Acute GI bleeding Active Problems:   CAD (coronary artery disease)   GERD (gastroesophageal reflux disease)   Lichen planus   Depression   Atrial fibrillation (HCC)   Chronic diastolic heart failure (HCC)  GI bleed Hemoglobin on presentation is 12 this morning is 11 IV PPI twice a day Last dose of Eliquis and plavix on June 9,  EGD and colonoscopy planned tomorrow will follow GI recommendation  PAF Rate controlled Continue verapamil with holding parameters, hold Eliquis  CAD status post CABG, chronic combined CHF, last EF 30 to 35% Continue Zetia Appears dry on presentation  , received ivf, today has some leg edema will Stop ivf today, continue hold diuretics Close  monitor volume status Hold Plavix Hold ramipril   DVT prophylaxis: SCDs Code Status: DNR Family Communication: Patient Disposition:   Status is: Inpatient     Dispo: The patient is from: Patient from [facility]               Anticipated d/c is to: back to [facility]              Anticipated d/c date is: 24-48hrs              Patient currently needs endoscope for gi bleed  Consultants:   GI  Procedures:   None  Antimicrobials:   None     Objective: Vitals:   03/17/20 2004 03/18/20 0441 03/18/20 0959 03/18/20 1202  BP: 139/74 (!) 141/74 (!) 141/67 117/67  Pulse: 94 65 75 61  Resp: 20 16 18 16   Temp: 97.8 F (36.6 C) 97.8 F (36.6 C) 98.9 F (37.2 C) 98.1 F (36.7 C)  TempSrc: Oral Oral Oral Oral  SpO2: 98% 98% 100% 97%  Weight:      Height:        Intake/Output Summary (Last 24 hours) at 03/18/2020 1251 Last data filed at 03/18/2020 0900 Gross per 24 hour  Intake 1961.44 ml  Output 1300 ml  Net 661.44 ml   Filed Weights   03/16/20 0936 03/17/20 0100  Weight: 83 kg 79.8 kg    Examination:  General  exam: calm, NAD, pleasant Respiratory system: Clear to auscultation. Respiratory effort normal. Cardiovascular system:  IRRR. No JVD, no murmur, No pedal edema. Gastrointestinal system: Abdomen is nondistended, soft and nontender. No organomegaly or masses felt. Normal bowel sounds heard. Central nervous system: Alert and oriented. No focal neurological deficits. Extremities: Symmetric 5 x 5 power. Mild pedal edema Skin: No rashes, lesions or ulcers Psychiatry: Judgement and insight appear normal. Mood & affect appropriate.     Data Reviewed: I have personally reviewed following labs and imaging studies  CBC: Recent Labs  Lab 03/16/20 0943 03/16/20 2011 03/17/20 0527 03/17/20 1655 03/18/20 0422  WBC 4.5  --  4.7  --  4.2  NEUTROABS  3.1  --   --   --   --   HGB 12.1 12.1 11.1* 10.6* 10.4*  HCT 36.6 36.3 33.4* 31.5* 32.0*  MCV 100.8*  --  102.1*  --  103.6*  PLT 228  --  228  --  657    Basic Metabolic Panel: Recent Labs  Lab 03/16/20 0943 03/17/20 0527 03/18/20 0422  NA 136 137 137  K 3.9 3.4* 3.6  CL 96* 102 105  CO2 27 27 27   GLUCOSE 123* 98 122*  BUN 22 17 8   CREATININE 1.10* 0.93 0.82  CALCIUM 9.7 9.2 9.1  MG  --   --  2.0    GFR: Estimated Creatinine Clearance: 53.9 mL/min (by C-G formula based on SCr of 0.82 mg/dL).  Liver Function Tests: Recent Labs  Lab 03/16/20 0943  AST 27  ALT 20  ALKPHOS 69  BILITOT 0.9  PROT 6.9  ALBUMIN 4.4    CBG: No results for input(s): GLUCAP in the last 168 hours.   Recent Results (from the past 240 hour(s))  Urine culture     Status: Abnormal   Collection Time: 03/16/20  9:43 AM   Specimen: Urine, Random  Result Value Ref Range Status   Specimen Description   Final    URINE, RANDOM Performed at [facility], [address], [place], [place] [address]    Special Requests   Final    NONE Performed at [facility], [address], [place], [place] [address]    Culture (A)  Final    <10,000 COLONIES/mL INSIGNIFICANT GROWTH Performed at [place] [address], [place], [place] [address]    Report Status 03/17/2020 FINAL  Final  SARS Coronavirus 2 by RT PCR (hospital order, performed in [facility] hospital lab) Nasopharyngeal Nasopharyngeal Swab     Status: None   Collection Time: 03/16/20  1:02 PM   Specimen: Nasopharyngeal Swab  Result Value Ref Range Status   SARS Coronavirus 2 NEGATIVE NEGATIVE Final    Comment: (NOTE) SARS-CoV-2 target nucleic acids are NOT DETECTED. The SARS-CoV-2 RNA is generally detectable in upper and lower respiratory specimens during the acute phase of infection. The lowest concentration of SARS-CoV-2 viral copies this assay can detect is 250 copies / mL. A negative result does not  preclude SARS-CoV-2 infection and should not be used as the sole basis for treatment or other patient management decisions.  A negative result may occur with improper specimen collection / handling, submission of specimen other than nasopharyngeal swab, presence of viral mutation(s) within the areas targeted by this assay, and inadequate number of viral copies (<250 copies / mL). A negative result must be combined with clinical observations, patient history, and epidemiological information. Fact Sheet for Patients:   StrictlyIdeas.no Fact Sheet  for Healthcare Providers: BankingDealers.co.za This test is not yet approved or cleared  by the [place] and has been authorized for detection and/or diagnosis of SARS-CoV-2 by FDA under an Emergency Use Authorization (EUA).  This EUA will remain in effect (meaning this test can be used) for the duration of the COVID-19 declaration under Section 564(b)(1) of the Act, 21 U.S.C. section 360bbb-3(b)(1), unless the authorization is terminated or revoked sooner. Performed at [facility], [address] [place], [place] [address]          Radiology Studies: No results found.      Scheduled Meds: . acetaminophen  1,000 mg Oral BID  . amitriptyline  25 mg Oral QHS  . ezetimibe  10 mg Oral QHS  . pantoprazole (PROTONIX) IV  40 mg Intravenous Q12H  . polyethylene glycol-electrolytes  4,000 mL Oral Once  . traZODone  150 mg Oral QHS  . verapamil  180 mg Oral BID   Continuous Infusions:    LOS: 2 days     Time spent: 77mins I have personally reviewed and interpreted on  03/18/2020 daily labs, tele strips, imagings as discussed above under date review session and assessment and plans.  I reviewed all nursing notes, pharmacy notes, consultant notes,  vitals, pertinent old records  I have discussed plan of care as described above with RN , patient  on 03/18/2020  Voice  Recognition /Dragon dictation system was used to create this note, attempts have been made to correct errors. Please contact the author with questions and/or clarifications.   Florencia Reasons, MD PhD FACP [facility]  Available via Epic secure chat 7am-7pm for nonurgent issues Please page for urgent issues To page the attending provider between 7A-7P or the covering provider during after hours 7P-7A, please log into the web site [email or web address] and access using universal [place] password for that web site. If you do not have the password, please call the hospital operator.    03/18/2020, 12:51 PM

## 2020-03-19 ENCOUNTER — Encounter: Payer: Self-pay | Admitting: Internal Medicine

## 2020-03-19 ENCOUNTER — Encounter
Admission: EM | Disposition: A | Discharge status: Home Health | Payer: Self-pay | Source: Home / Self Care | Attending: Internal Medicine

## 2020-03-19 ENCOUNTER — Inpatient Hospital Stay: Payer: Medicare Other | Admitting: Anesthesiology

## 2020-03-19 DIAGNOSIS — K5730 Diverticulosis of large intestine without perforation or abscess without bleeding: Secondary | ICD-10-CM

## 2020-03-19 DIAGNOSIS — K3189 Other diseases of stomach and duodenum: Secondary | ICD-10-CM

## 2020-03-19 DIAGNOSIS — K921 Melena: Secondary | ICD-10-CM

## 2020-03-19 DIAGNOSIS — K922 Gastrointestinal hemorrhage, unspecified: Secondary | ICD-10-CM

## 2020-03-19 DIAGNOSIS — K6389 Other specified diseases of intestine: Secondary | ICD-10-CM

## 2020-03-19 HISTORY — PX: ESOPHAGOGASTRODUODENOSCOPY: SHX5428

## 2020-03-19 HISTORY — PX: COLONOSCOPY WITH PROPOFOL: SHX5780

## 2020-03-19 HISTORY — PX: COLONOSCOPY: SHX5424

## 2020-03-19 LAB — BASIC METABOLIC PANEL
Anion gap: 9 (ref 5–15)
BUN: 6 mg/dL — ABNORMAL LOW (ref 8–23)
CO2: 25 mmol/L (ref 22–32)
Calcium: 9.1 mg/dL (ref 8.9–10.3)
Chloride: 102 mmol/L (ref 98–111)
Creatinine, Ser: 0.74 mg/dL (ref 0.44–1.00)
GFR calc Af Amer: >60 mL/min (ref >60)
GFR calc non Af Amer: >60 mL/min (ref >60)
Glucose, Bld: 122 mg/dL — ABNORMAL HIGH (ref 70–99)
Potassium: 4.1 mmol/L (ref 3.5–5.1)
Sodium: 136 mmol/L (ref 135–145)

## 2020-03-19 LAB — GLUCOSE, CAPILLARY: Glucose-Capillary: 106 mg/dL — ABNORMAL HIGH (ref 70–99)

## 2020-03-19 LAB — HEMOGLOBIN AND HEMATOCRIT, BLOOD
HCT: 34.6 % — ABNORMAL LOW (ref 36.0–46.0)
HCT: 35.4 % — ABNORMAL LOW (ref 36.0–46.0)
Hemoglobin: 11.1 g/dL — ABNORMAL LOW (ref 12.0–15.0)
Hemoglobin: 11.4 g/dL — ABNORMAL LOW (ref 12.0–15.0)

## 2020-03-19 LAB — SARS CORONAVIRUS 2 BY RT PCR (HOSPITAL ORDER, PERFORMED IN ~~LOC~~ HOSPITAL LAB): SARS Coronavirus 2: NEGATIVE

## 2020-03-19 SURGERY — EGD (ESOPHAGOGASTRODUODENOSCOPY)
Anesthesia: General

## 2020-03-19 MED ORDER — BISACODYL 5 MG PO TBEC: 10.0000 mg | DELAYED_RELEASE_TABLET | Freq: Once | ORAL | Status: DC

## 2020-03-19 MED ORDER — POLYETHYLENE GLYCOL 3350 17 GM/SCOOP PO POWD: 8.5000 g | Freq: Every day | ORAL | 0 refills | Status: DC

## 2020-03-19 MED ORDER — ACETAMINOPHEN 10 MG/ML IV SOLN
INTRAVENOUS | Status: AC
  Filled 2020-03-19: qty 100

## 2020-03-19 MED ORDER — SODIUM CHLORIDE 0.9 % IV SOLN
INTRAVENOUS | Status: DC
  Administered 2020-03-19: 1000 mL via INTRAVENOUS

## 2020-03-19 MED ORDER — SODIUM CHLORIDE 0.9% IV SOLUTION: Freq: Once | INTRAVENOUS | Status: DC

## 2020-03-19 MED ORDER — FENTANYL CITRATE (PF) 100 MCG/2ML IJ SOLN
INTRAMUSCULAR | Status: AC
  Filled 2020-03-19: qty 2

## 2020-03-19 MED ORDER — PROPOFOL 10 MG/ML IV BOLUS
INTRAVENOUS | Status: DC | PRN
  Administered 2020-03-19: 30 mg via INTRAVENOUS

## 2020-03-19 MED ORDER — PROPOFOL 500 MG/50ML IV EMUL
INTRAVENOUS | Status: AC
  Filled 2020-03-19: qty 50

## 2020-03-19 MED ORDER — SODIUM CHLORIDE 0.9 % IV SOLN: INTRAVENOUS | Status: DC

## 2020-03-19 MED ORDER — PROPOFOL 500 MG/50ML IV EMUL
INTRAVENOUS | Status: DC | PRN
  Administered 2020-03-19: 100 ug/kg/min via INTRAVENOUS

## 2020-03-19 NOTE — Discharge Summary (Addendum)
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   Recommendations for Outpatient Follow-up:  1. F/u with PCP within a week  for hospital discharge follow up, repeat cbc/bmp at follow up. pcp to follow up on final urine culture result, urine culture result pending at time of discharge 2. Follow-up with GI in 2 weeks 3. F/u with cardiology  4. Facility to arrange outpatient  PT   Discharge Diagnoses:  Active Hospital Problems   Diagnosis Date Noted  . Acute GI bleeding 03/16/2020  . Chronic diastolic heart failure ([place]) 10/19/2016  . Atrial fibrillation ([place]) 10/09/2016  . CAD (coronary artery disease) 06/21/2013  . GERD (gastroesophageal reflux disease) 06/21/2013  . Lichen planus 07/62/2633  . Depression 06/21/2013    Resolved Hospital Problems  No resolved problems to display.    Discharge Condition: stable  Diet recommendation: heart healthy  Filed Weights   03/16/20 0936 03/17/20 0100 03/19/20 0814  Weight: 83 kg 79.8 kg 83 kg    History of present illness: (Per admitting MD Dr. 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  Patient has lichen planus in her mouth and vulvar area and is concerned that it has extended into her rectum resulting in rectal bleeding.  Patient had one episode of rectal bleeding at the assisted living  facility but had multiple episodes in the emergency room that was witnessed by her nurse.  Stools are described as rust colored. Labs reveal hemoglobin of 12.1g/dl.  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  Patient had requested transfer to [facility] but there are no beds available at this time so she will be admitted here at [facility] with GI consult  Hospital Course:  Principal Problem:   Acute GI bleeding Active Problems:   CAD (coronary artery disease)   GERD (gastroesophageal reflux disease)   Lichen planus   Depression   Atrial fibrillation (HCC)   Chronic diastolic heart failure (HCC)   GI bleed -Hemoglobin on presentation is 12, hgb nadir at 10.4, hgb 11.1 at discharge, she did not require any blood transfusion  -She received IV PPI twice a day, discharged on oral twice daily -Last dose of Eliquis and plavix on June 9, resume Eliquis at discharge, continue hold Plavix(as per last cardiac catheterization done in June 2019 Plavix supposed to be a year only, I have discussed with patient regarding holding Plavix, she is in agreement, she is advised to follow-up with cardiology to discuss Plavix use)  EGD on June 12:  Normal esophagus.                        - Erythematous mucosa in the antrum.                        -  Normal duodenal bulb, second portion of the duodenum                         and examined duodenum.                        - No specimens collected. Recommendation:        - Perform an H. pylori serology    Colonoscopy on June 12: Hemorrhoids found on perianal exam.                        - Altered vascular and thickened folds of the mucosa                         in the ascending colon.                        - Erythematous mucosa in the sigmoid colon. Biopsied.                        - Diverticulosis in the sigmoid colon. There was no                          evidence of diverticular bleeding.                        - The entire examined colon and rectum are normal.                         Biopsied.                        - The distal rectum and anal verge are normal on                         retroflexion view.                        - The erythematous mucosa in the sigmoid colon is the                         likely source of patient's recent GI bleeding. There                         is no signs of active bleeding present at this time.  F/u with  GI  In two weeks  PAF Rate controlled Continue verapamil,  Eliquis  CAD status post CABG, chronic combined CHF, last EF 30 to 35% -Continue Hold Plavix ((as per last cardiac catheterization done in June 2019 Plavix supposed to be a year only, I have discussed with patient regarding holding Plavix, she is in agreement, she is advised to follow-up with cardiology to discuss Plavix use) -Patient initially presented with dehydration, borderline high blood pressure, Lasix and ramipril held in the hospital. -Blood pressure improved, Lasix ramipril resumed at discharge -euvolemic at discharge, no chest pain  DVT prophylaxis: SCDs Code Status: DNR Family Communication: Patient Disposition:   Dispo: The patient is from: Patient from [facility]  discharge back to [facility] with home health   Consultants:   GI  Procedures:  EGD and colonoscopy on 6/12  Antimicrobials:   None     Discharge Exam: BP 121/63 (BP Location: Left Arm)   Pulse 84   Temp 97.6 F (36.4 C) (Oral)   Resp 14   Ht 5\' 3"  (1.6 m)   Wt 83 kg   SpO2 99%   BMI 32.42 kg/m   General: NAD, pleseant  Cardiovascular: IRRR Respiratory: CTABL  Discharge Instructions You were cared for by a hospitalist during your hospital stay. If you have any questions about your discharge medications or the care you received while you were in the hospital after you  are discharged, you can call the unit and asked to speak with the hospitalist on call if the hospitalist that took care of you is not available. Once you are discharged, your primary care physician will handle any further medical issues. Please note that NO REFILLS for any discharge medications will be authorized once you are discharged, as it is imperative that you return to your primary care physician (or establish a relationship with a primary care physician if you do not have one) for your aftercare needs so that they can reassess your need for medications and monitor your lab values.  Discharge Instructions    Diet - low sodium heart healthy   Complete by: As directed    Increase activity slowly   Complete by: As directed      Allergies as of 03/19/2020      Reactions   Beta Adrenergic Blockers Shortness Of Breath   DEPRESSION/HYPOTENSION   Brilinta [ticagrelor] Shortness Of Breath   Caused AFIB   Albuterol    rigors   Antihistamines, Diphenhydramine-type    Muscle spasms   Aspirin Other (See Comments)   Heartburn    Nsaids Other (See Comments)   ULCER HISTORY & CURRENTLY ON BLOOD THINNERS   Penicillins Hives, Other (See Comments)   Has patient had a PCN reaction causing immediate rash, facial/tongue/throat swelling, SOB or lightheadedness with hypotension: No Has patient had a PCN reaction causing severe rash involving mucus membranes or skin necrosis: No Has patient had a PCN reaction that required hospitalization No Has patient had a PCN reaction occurring within the last 10 years: No If all of the above answers are "NO", then may proceed with Cephalosporin use.   Statins Other (See Comments)   Muscle weakness   Sulfa Antibiotics Rash   At mouth   Sulfasalazine Rash   At mouth   Tetracycline Rash   Tetracyclines & Related Rash      Medication List    STOP taking these medications   clopidogrel 75 MG tablet Commonly known as: PLAVIX     TAKE these medications     acetaminophen 500 MG tablet Commonly known as: TYLENOL Take 1,000 mg by mouth 2 (two) times daily.   amitriptyline 25 MG tablet Commonly known as: ELAVIL TAKE 1 TABLET BY MOUTH AT BEDTIME What changed: additional instructions   apixaban 5 MG Tabs tablet Commonly known as: Eliquis TAKE 1 TABLET(5 MG) BY MOUTH TWICE DAILY   b complex vitamins tablet Take 1 tablet by mouth daily.   Biotin w/ Vitamins C & E 1250-7.5-7.5 MCG-MG-UNT Chew Chew by mouth.   clotrimazole 10 MG troche Commonly known as: MYCELEX SMARTSIG:1 Lozenge(s) By Mouth 5 Times Daily   conjugated estrogens vaginal cream Commonly known as: Premarin Apply 0.5mg  (pea-sized amount)  just inside the vaginal introitus with a finger-tip on  Monday, Wednesday and Friday nights. What changed:  when to take this  reasons to take this   CoQ-10 200 MG Caps Take 200 mg by mouth daily.   CRANBERRY PO Take 1 capsule by mouth daily.   ezetimibe 10 MG tablet Commonly known as: ZETIA TAKE 1 TABLET(10 MG) BY MOUTH DAILY   furosemide 40 MG tablet Commonly known as: LASIX Take 1 tablet (40 mg total) by mouth 2 (two) times daily.   HAIR/SKIN/NAILS PO Take 1 tablet by mouth daily.   hydrocortisone 2.5 % cream Apply to VULVA at bedtime as needed with miconazole for lichen planus   loratadine 10 MG tablet Commonly known as: CLARITIN Take 10 mg by mouth daily.   mupirocin ointment 2 % Commonly known as: Bactroban Apply to affected area bid What changed: additional instructions   pantoprazole 40 MG tablet Commonly known as: PROTONIX TAKE 1 TABLET(40 MG) BY MOUTH TWICE DAILY   polyethylene glycol powder 17 GM/SCOOP powder Commonly known as: GLYCOLAX/MIRALAX Take 8.5 g by mouth daily. Hold if diarrhea What changed:   when to take this  reasons to take this  additional instructions   PreviDent 5000 Booster Plus 1.1 % Pste Generic drug: Sodium Fluoride USE TID AFTER MEALS . DO NOT RINSE   ramipril 5 MG  capsule Commonly known as: ALTACE TAKE 1 CAPSULE(5 MG) BY MOUTH DAILY   Repatha SureClick 778 MG/ML Soaj Generic drug: Evolocumab INJECT 1 PEN AS DIRECTED EVERY 14 DAYS   spironolactone 25 MG tablet Commonly known as: ALDACTONE TAKE 1 TABLET(25 MG) BY MOUTH EVERY MORNING   traMADol 50 MG tablet Commonly known as: ULTRAM TAKE 1 TABLET(50 MG) BY MOUTH TWICE DAILY AS NEEDED   traZODone 150 MG tablet Commonly known as: DESYREL Take 1 tablet to  1and 1/3 tablets q hs   verapamil 180 MG CR tablet Commonly known as: CALAN-SR TAKE 1 TABLET(180 MG) BY MOUTH TWICE DAILY   vitamin B-12 1000 MCG tablet Commonly known as: CYANOCOBALAMIN Take 1,000 mcg by mouth daily.   vitamin C 500 MG tablet Commonly known as: ASCORBIC ACID Take 500 mg by mouth at bedtime.   Vitamin D3 50 MCG (2000 UT) Tabs Take 2,000 Units by mouth 2 (two) times a week.      Allergies  Allergen Reactions  . Beta Adrenergic Blockers Shortness Of Breath    DEPRESSION/HYPOTENSION  . Brilinta [Ticagrelor] Shortness Of Breath    Caused AFIB  . Albuterol     rigors  . Antihistamines, Diphenhydramine-Type     Muscle spasms  . Aspirin Other (See Comments)    Heartburn   . Nsaids Other (See Comments)    ULCER HISTORY & CURRENTLY ON BLOOD THINNERS  . Penicillins Hives and Other (See Comments)    Has patient had a PCN reaction causing immediate rash, facial/tongue/throat swelling, SOB or lightheadedness with hypotension: No Has patient had a PCN reaction causing severe rash involving mucus membranes or skin necrosis: No Has patient had a PCN reaction that required hospitalization No Has patient had a PCN reaction occurring within the last 10 years: No If all of the above answers are "NO", then may proceed with Cephalosporin use.   . Statins Other (See Comments)    Muscle weakness  . Sulfa Antibiotics Rash    At mouth  . Sulfasalazine Rash    At mouth  . Tetracycline Rash  . Tetracyclines & Related Rash     Follow-up Information    [name], MD Follow up in 1 week(s).   Specialty: Internal Medicine Why:  Hospital discharge follow-up, repeat CBC BMP at follow-up Contact information: [address] [place] [address] [phone]        Minna Merritts, MD Follow up in 1 week(s).   Specialty: Cardiology Why: please discuss with Dr Rockey Situ regarding plavix use Contact information: [address] [place] [address] [phone]        Efrain Sella, MD Follow up in 1 week(s).   Specialty: Gastroenterology Why: for GI bleed, follow up on biopsy result,  need to repeat colonoscopy in three months Contact information: [place] [address] [phone]                The results of significant diagnostics from this hospitalization (including imaging, microbiology, ancillary and laboratory) are listed below for reference.    Significant Diagnostic Studies: No results found.  Microbiology: Recent Results (from the past 240 hour(s))  Urine culture     Status: Abnormal   Collection Time: 03/16/20  9:43 AM   Specimen: Urine, Random  Result Value Ref Range Status   Specimen Description   Final    URINE, RANDOM Performed at [facility], [address], [place], [place] [address]    Special Requests   Final    NONE Performed at [facility] [address] [place], [place] [address]    Culture (A)  Final    <10,000 COLONIES/mL INSIGNIFICANT GROWTH Performed at [facility], [place] [address] [place], [place] [address]    Report Status 03/17/2020 FINAL  Final  SARS Coronavirus 2 by RT PCR (hospital order, performed in [facility] hospital lab) Nasopharyngeal Nasopharyngeal Swab     Status: None   Collection Time: 03/16/20  1:02 PM   Specimen: Nasopharyngeal Swab  Result Value Ref Range Status   SARS Coronavirus 2 NEGATIVE NEGATIVE Final    Comment: (NOTE) SARS-CoV-2  target nucleic acids are NOT DETECTED. The SARS-CoV-2 RNA is generally detectable in upper and lower respiratory specimens during the acute phase of infection. The lowest concentration of SARS-CoV-2 viral copies this assay can detect is 250 copies / mL. A negative result does not preclude SARS-CoV-2 infection and should not be used as the sole basis for treatment or other patient management decisions.  A negative result may occur with improper specimen collection / handling, submission of specimen other than nasopharyngeal swab, presence of viral mutation(s) within the areas targeted by this assay, and inadequate number of viral copies (<250 copies / mL). A negative result must be combined with clinical observations, patient history, and epidemiological information. Fact Sheet for Patients:   StrictlyIdeas.no Fact Sheet for Healthcare Providers: BankingDealers.co.za This test is not yet approved or cleared  by the [place] FDA and has been authorized for detection and/or diagnosis of SARS-CoV-2 by FDA under an Emergency Use Authorization (EUA).  This EUA will remain in effect (meaning this test can be used) for the duration of the COVID-19 declaration under Section 564(b)(1) of the Act, 21 U.S.C. section 360bbb-3(b)(1), unless the authorization is terminated or revoked sooner. Performed at [facility], [address], [place], [place] [address]   Urine Culture     Status: Abnormal (Preliminary result)   Collection Time: 03/17/20  9:29 PM   Specimen: Urine, Random  Result Value Ref Range Status   Specimen Description   Final    URINE, RANDOM Performed at [facility], [address], [place],  [address]  Special Requests   Final    NONE Performed at [facility], [place], [place] [address]    Culture 30,000 COLONIES/mL PROTEUS MIRABILIS (A)  Final   Report Status PENDING   Incomplete     Labs: Basic Metabolic Panel: Recent Labs  Lab 03/16/20 0943 03/17/20 0527 03/18/20 0422 03/19/20 0457  NA 136 137 137 136  K 3.9 3.4* 3.6 4.1  CL 96* 102 105 102  CO2 27 27 27 25   GLUCOSE 123* 98 122* 122*  BUN 22 17 8  6*  CREATININE 1.10* 0.93 0.82 0.74  CALCIUM 9.7 9.2 9.1 9.1  MG  --   --  2.0  --    Liver Function Tests: Recent Labs  Lab 03/16/20 0943  AST 27  ALT 20  ALKPHOS 69  BILITOT 0.9  PROT 6.9  ALBUMIN 4.4   No results for input(s): LIPASE, AMYLASE in the last 168 hours. No results for input(s): AMMONIA in the last 168 hours. CBC: Recent Labs  Lab 03/16/20 0943 03/16/20 2011 03/17/20 0527 03/17/20 1655 03/18/20 0422 03/18/20 1712 03/19/20 0457  WBC 4.5  --  4.7  --  4.2  --   --   NEUTROABS 3.1  --   --   --   --   --   --   HGB 12.1   < > 11.1* 10.6* 10.4* 11.7* 11.1*  HCT 36.6   < > 33.4* 31.5* 32.0* 34.1* 34.6*  MCV 100.8*  --  102.1*  --  103.6*  --   --   PLT 228  --  228  --  200  --   --    < > = values in this interval not displayed.   Cardiac Enzymes: No results for input(s): CKTOTAL, CKMB, CKMBINDEX, TROPONINI in the last 168 hours. BNP: BNP (last 3 results) No results for input(s): BNP in the last 8760 hours.  ProBNP (last 3 results) No results for input(s): PROBNP in the last 8760 hours.  CBG: Recent Labs  Lab 03/19/20 0736  GLUCAP 106*       Signed:  Florencia Reasons MD, PhD, FACP  [facility] 03/19/2020, 2:30 PM

## 2020-03-19 NOTE — Transfer of Care (Signed)
Immediate Anesthesia Transfer of Care Note  [name], MD  Procedure(s) Performed: ESOPHAGOGASTRODUODENOSCOPY (EGD) (N/A ) COLONOSCOPY WITH PROPOFOL (N/A ) COLONOSCOPY  Patient Location: PACU  Anesthesia Type:MAC  Level of Consciousness: awake and alert   Airway & Oxygen Therapy: Patient Spontanous Breathing  Post-op Assessment: Report given to RN  Post vital signs: Reviewed and stable  Last Vitals:  Vitals Value Taken Time  BP 95/57 03/19/20 0858  Temp 36.8 C 03/19/20 0858  Pulse 70 03/19/20 0858  Resp 20 03/19/20 0858  SpO2 100 % 03/19/20 0858    Last Pain:  Vitals:   03/19/20 0411  TempSrc: Oral  PainSc:          Complications: No complications documented.

## 2020-03-19 NOTE — TOC Transition Note (Signed)
Transition of Care [facility]) - CM/SW Discharge Note   Patient Details  Name: Charlotte KOOPMAN, MD MRN: [id number] Date of Birth: 1936-09-02  [facility] of [facility]) CM/SW Contact:  Meriel Flavors, LCSW Phone Number: 03/19/2020, 3:51 PM   Clinical Narrative:    6/12:Patient discharging back to [facility] with outpatient PT. CSW spoke with Daughter, Benjamine Mola. She arranged for patient to be transported by [facility]. Patient has OP PT orders and [facility] will provide.          Patient Goals and CMS Choice        Discharge Placement                       Discharge Plan and Services   Discharge Planning Services: CM Consult                      HH Arranged: Patient Refused [facility]          Social Determinants of Health (SDOH) Interventions     Readmission Risk Interventions No flowsheet data found.

## 2020-03-19 NOTE — Progress Notes (Signed)
Discharge order received. Patient mental status is at baseline. Vital signs stable . No signs of acute distress. Discharge instructions given. Patient verbalized understanding. No other issues noted at this time. Pt was picked up by [facility].

## 2020-03-19 NOTE — Op Note (Signed)
[facility] Patient Name: Josilyn Shippee Procedure Date: 03/19/2020 8:02 AM MRN: [id number] Account #: [id number] Date of Birth: 08/26/1936 Admit Type: Inpatient Age: 84 Room: [facility] ENDO ROOM 4 Gender: Female Note Status: Finalized Procedure:             Colonoscopy Indications:           Melena Providers:             Dereka Lueras B. Bonna Gains MD, MD Medicines:             Monitored Anesthesia Care Complications:         No immediate complications. Procedure:             Pre-Anesthesia Assessment:                        - Prior to the procedure, a History and Physical was                         performed, and patient medications, allergies and                         sensitivities were reviewed. The patient's tolerance                         of previous anesthesia was reviewed.                        - The risks and benefits of the procedure and the                         sedation options and risks were discussed with the                         patient. All questions were answered and informed                         consent was obtained.                        - Patient identification and proposed procedure were                         verified prior to the procedure by the physician, the                         nurse, the anesthesiologist, the anesthetist and the                         technician. The procedure was verified in the                         pre-procedure area in the procedure room in the                         endoscopy suite.                        - Prophylactic Antibiotics: The patient does not  require prophylactic antibiotics.                        - ASA Grade Assessment: II - A patient with mild                         systemic disease.                        - After reviewing the risks and benefits, the patient                         was deemed in satisfactory condition to undergo the                          procedure.                        - Monitored anesthesia care was determined to be                         medically necessary for this procedure based on review                         of the patient's medical history, medications, and                         prior anesthesia history.                        - The anesthesia plan was to use monitored anesthesia                         care (MAC).                        After obtaining informed consent, the colonoscope was                         passed under direct vision. Throughout the procedure,                         the patient's blood pressure, pulse, and oxygen                         saturations were monitored continuously. The                         Colonoscope was introduced through the anus and                         advanced to the the cecum, identified by appendiceal                         orifice and ileocecal valve. The colonoscopy was                         performed with ease. The patient tolerated the  procedure well. The quality of the bowel preparation                         was fair. Findings:      Hemorrhoids were found on perianal exam.      A localized area of mildly altered vascular and thickened folds of the       mucosa was found in the ascending colon. Due to recent bleeding this was       not biopsied      A localized area of moderately erythematous mucosa was found in the       sigmoid colon. Biopsies were taken with a cold forceps for histology.       This is the likely source of patient's recent bleeding      Multiple small and large-mouthed diverticula were found in the sigmoid       colon. There was no evidence of diverticular bleeding.      The exam was otherwise normal throughout the examined colon.      The entire examined colon and rectum appeared normal. Biopsies were       taken with a cold forceps for histology.      The retroflexed view of the distal rectum  and anal verge was normal and       showed no anal or rectal abnormalities. Impression:            - Preparation of the colon was fair.                        - Hemorrhoids found on perianal exam.                        - Altered vascular and thickened folds of the mucosa                         in the ascending colon.                        - Erythematous mucosa in the sigmoid colon. Biopsied.                        - Diverticulosis in the sigmoid colon. There was no                         evidence of diverticular bleeding.                        - The entire examined colon and rectum are normal.                         Biopsied.                        - The distal rectum and anal verge are normal on                         retroflexion view.                        - The erythematous mucosa in the sigmoid colon is the  likely source of patient's recent GI bleeding. There                         is no signs of active bleeding present at this time. Recommendation:        - Await pathology results.                        - Return to GI clinic in 2 weeks.                        - Continue present medications.                        - Return to primary care physician in 2 weeks.                        - The findings and recommendations were discussed with                         the patient.                        - Repeat colonoscopy within 3 months, to reassess                         ascending colon thickened fold.                        - Avoid constipation. High fiber diet. Pt should have                         1-2 soft BMs daily. Use miralax daily if BMs are not                         at that goal. Follow up in GI clinic to assess for                         treatment response to miralax or change medication as                         necessary Procedure Code(s):     --- Professional ---                        [phone], Colonoscopy, flexible; with biopsy, single  or                         multiple Diagnosis Code(s):     --- Professional ---                        K64.9, Unspecified hemorrhoids                        K63.89, Other specified diseases of intestine                        K92.1, Melena (includes Hematochezia)                        K57.30, Diverticulosis  of large intestine without                         perforation or abscess without bleeding CPT copyright 2019 American [employer] Association. All rights reserved. The codes documented in this report are preliminary and upon coder review may  be revised to meet current compliance requirements.  Vonda Antigua, MD Margretta Sidle B. Bonna Gains MD, MD 03/19/2020 9:16:11 AM This report has been signed electronically. Number of Addenda: 0 Note Initiated On: 03/19/2020 8:02 AM Scope Withdrawal Time: 0 hours 11 minutes 2 seconds  Total Procedure Duration: 0 hours 20 minutes 14 seconds  Estimated Blood Loss:  Estimated blood loss: none.      [facility]

## 2020-03-19 NOTE — Anesthesia Preprocedure Evaluation (Addendum)
Anesthesia Evaluation  Patient identified by MRN, date of birth, ID band Patient awake    Reviewed: Allergy & Precautions, H&P , NPO status , Patient's Chart, lab work & pertinent test results, reviewed documented beta blocker date and time   Airway Mallampati: II   Neck ROM: full    Dental  (+) Poor Dentition   Pulmonary shortness of breath and with exertion, asthma , former smoker,    Pulmonary exam normal        Cardiovascular Exercise Tolerance: Poor hypertension, + angina with exertion + CAD and +CHF  Normal cardiovascular exam+ Valvular Problems/Murmurs  Rhythm:regular Rate:Normal     Neuro/Psych  Headaches, PSYCHIATRIC DISORDERS Anxiety Depression    GI/Hepatic Neg liver ROS, hiatal hernia, PUD, GERD  Medicated,  Endo/Other  negative endocrine ROS  Renal/GU Renal disease  negative genitourinary   Musculoskeletal   Abdominal   Peds  Hematology  (+) Blood dyscrasia, anemia ,   Anesthesia Other Findings Past Medical History: No date: Allergy No date: Arthritis     Comment:  s/p bilateral knees and left hip replacement No date: Asthma No date: CAD (coronary artery disease)     Comment:  a. 12/1996 s/p CABG x4 [facility] [place]);  b. 2005 Pt               reports stress test & cath, which revealed patent grafts. No date: Cancer [facility])     Comment:  melanoma right arm No date: Carotid arterial disease ([place])     Comment:  a. 04/2015 Carotid U/S: <50% bilat ICA stenosis. No date: Chronic kidney disease No date: Colon polyps     Comment:  H/O No date: Depression No date: GERD (gastroesophageal reflux disease)     Comment:  h/o hiatal hernia No date: Headache     Comment:  migraines in past No date: Heart murmur     Comment:  a. 04/2011 Echo: EF 55-60%, bilat atrial enlargement,               mild to mod TR. No date: History of chicken pox No date: History of hiatal hernia No date: Hx of migraines     Comment:   rare now No date: Hx: UTI (urinary tract infection) No date: Hyperlipidemia     Comment:  a. Statin intolerant -->on zetia. No date: Hypertension No date: Hypertensive heart disease No date: Lichen planus No date: Melanoma ([place]) No date: Palpitations     Comment:  a. rare PVC's and h/o SVT. No date: PMR (polymyalgia rheumatica) (HCC)     Comment:  h/o in setting of crestor usage. No date: PSVT (paroxysmal supraventricular tachycardia) (HCC) No date: PUD (peptic ulcer disease)     Comment:  remote history No date: Raynaud's phenomenon No date: Spinal stenosis No date: Urine incontinence     Comment:  H/O No date: Vitamin D deficiency Past Surgical History: No date: ADENOIDECTOMY     Comment:  age 59 No date: BACK SURGERY     Comment:  L3-L5 No date: BILATERAL CARPAL TUNNEL RELEASE No date: BREAST BIOPSY; Right     Comment:  bx x 3 neg No date: BREAST SURGERY; Right     Comment:  biopsy x 3 (all benign) 06/01/2016: CARDIAC CATHETERIZATION; N/A     Comment:  Procedure: LEFT HEART CATH AND CORS/GRAFTS ANGIOGRAPHY;               Surgeon: Wellington Hampshire, MD;  Location: [place]  CV LAB;  Service: Cardiovascular;  Laterality: N/A; 06/01/2016: CARDIAC CATHETERIZATION; N/A     Comment:  Procedure: Coronary Stent Intervention;  Surgeon:               Wellington Hampshire, MD;  Location: [place] CV LAB;                Service: Cardiovascular;  Laterality: N/A; 03/08/2017: CARDIOVERSION; N/A     Comment:  Procedure: CARDIOVERSION;  Surgeon: Wellington Hampshire,               MD;  Location: [facility] ORS;  Service: Cardiovascular;                Laterality: N/A; 01/04/2016: CATARACT EXTRACTION W/PHACO; Right     Comment:  Procedure: CATARACT EXTRACTION PHACO AND INTRAOCULAR               LENS PLACEMENT ([place]);  Surgeon: Leandrew Koyanagi, MD;               Location: [place];  Service: Ophthalmology;                Laterality: Right;  MALYUGIN 01/25/2016:  CATARACT EXTRACTION W/PHACO; Left     Comment:  Procedure: CATARACT EXTRACTION PHACO AND INTRAOCULAR               LENS PLACEMENT ([place]) left eye;  Surgeon: Leandrew Koyanagi, MD;  Location: [place];  Service:              Ophthalmology;  Laterality: Left;  MALYUGIN SHUGARCAINE 90's: CHOLECYSTECTOMY 05/03/2015: COLONOSCOPY WITH PROPOFOL; N/A     Comment:  Procedure: COLONOSCOPY WITH PROPOFOL;  Surgeon: Lollie Sails, MD;  Location: [facility] ENDOSCOPY;  Service:               Endoscopy;  Laterality: N/A; 98: CORONARY ARTERY BYPASS GRAFT     Comment:  4 vessel 03/31/2018: CORONARY STENT INTERVENTION; N/A     Comment:  Procedure: CORONARY STENT INTERVENTION;  Surgeon: Wellington Hampshire, MD;  Location: [place] CV LAB;                Service: Cardiovascular;  Laterality: N/A; 03/22/2015: ESOPHAGOGASTRODUODENOSCOPY; N/A     Comment:  Procedure: ESOPHAGOGASTRODUODENOSCOPY (EGD);  Surgeon:               Lollie Sails, MD;  Location: [facility] ENDOSCOPY;                Service: Endoscopy;  Laterality: N/A; No date: HEMORRHOID BANDING 02/17/2018: HEMORRHOID SURGERY; N/A     Comment:  Procedure: HEMORRHOIDECTOMY;  Surgeon: Robert Bellow, MD;  Location: [facility] ORS;  Service: General;                Laterality: N/A; No date: JOINT REPLACEMENT     Comment:  BILATERAL KNEE REPLACEMENTS No date: KNEE ARTHROSCOPY W/ OATS PROCEDURE     Comment:  Lt knee (9/01), Rt knee (3/11), Lt hip (5/10) 03/31/2018: LEFT HEART CATH AND CORONARY ANGIOGRAPHY; N/A     Comment:  Procedure: LEFT HEART CATH AND CORONARY ANGIOGRAPHY;  Surgeon: Wellington Hampshire, MD;  Location: [place]               CV LAB;  Service: Cardiovascular;  Laterality: N/A; No date: TOTAL HIP ARTHROPLASTY BMI    Body Mass Index: 31.16 kg/m     Reproductive/Obstetrics negative OB ROS                             Anesthesia  Physical Anesthesia Plan  ASA: IV  Anesthesia Plan: General   Post-op Pain Management:    Induction:   PONV Risk Score and Plan:   Airway Management Planned:   Additional Equipment:   Intra-op Plan:   Post-operative Plan:   Informed Consent: I have reviewed the patients History and Physical, chart, labs and discussed the procedure including the risks, benefits and alternatives for the proposed anesthesia with the patient or authorized representative who has indicated his/her understanding and acceptance.     Dental Advisory Given  Plan Discussed with: CRNA  Anesthesia Plan Comments:        Anesthesia Quick Evaluation

## 2020-03-19 NOTE — Op Note (Signed)
[facility] Patient Name: Charlotte Wagner Procedure Date: 03/19/2020 8:02 AM MRN: [id number] Account #: [id number] Date of Birth: September 27, 1936 Admit Type: Inpatient Age: 84 Room: [facility] ENDO ROOM 4 Gender: Female Note Status: Finalized Procedure:             Upper GI endoscopy Indications:           Melena Providers:             Pradyun Ishman B. Bonna Gains MD, MD Medicines:             Monitored Anesthesia Care Complications:         No immediate complications. Procedure:             Pre-Anesthesia Assessment:                        - Prior to the procedure, a History and Physical was                         performed, and patient medications, allergies and                         sensitivities were reviewed. The patient's tolerance                         of previous anesthesia was reviewed.                        - The risks and benefits of the procedure and the                         sedation options and risks were discussed with the                         patient. All questions were answered and informed                         consent was obtained.                        - Patient identification and proposed procedure were                         verified prior to the procedure by the physician, the                         nurse, the anesthesiologist, the anesthetist and the                         technician. The procedure was verified in the                         procedure room.                        - ASA Grade Assessment: II - A patient with mild                         systemic disease.  After obtaining informed consent, the endoscope was                         passed under direct vision. Throughout the procedure,                         the patient's blood pressure, pulse, and oxygen                         saturations were monitored continuously. The Endoscope                         was introduced through the mouth, and  advanced to the                         second part of duodenum. The upper GI endoscopy was                         accomplished with ease. The patient tolerated the                         procedure well. Findings:      The examined esophagus was normal.      Patchy mildly erythematous mucosa without bleeding was found in the       gastric antrum.      The exam of the stomach was otherwise normal.      The duodenal bulb, second portion of the duodenum and examined duodenum       were normal. Impression:            - Normal esophagus.                        - Erythematous mucosa in the antrum.                        - Normal duodenal bulb, second portion of the duodenum                         and examined duodenum.                        - No specimens collected. Recommendation:        - Perform an H. pylori serology today.                        - Continue present medications.                        - Patient has a contact number available for                         emergencies. The signs and symptoms of potential                         delayed complications were discussed with the patient.                         Return to normal activities tomorrow. Written  discharge instructions were provided to the patient.                        - The findings and recommendations were discussed with                         the patient.                        - Return to primary care physician in 2 weeks. Procedure Code(s):     --- Professional ---                        [phone], Esophagogastroduodenoscopy, flexible,                         transoral; diagnostic, including collection of                         specimen(s) by brushing or washing, when performed                         (separate procedure) Diagnosis Code(s):     --- Professional ---                        K31.89, Other diseases of stomach and duodenum                        K92.1, Melena (includes  Hematochezia) CPT copyright 2019 American Medical Association. All rights reserved. The codes documented in this report are preliminary and upon coder review may  be revised to meet current compliance requirements.  Vonda Antigua, MD Margretta Sidle B. Bonna Gains MD, MD 03/19/2020 8:34:19 AM This report has been signed electronically. Number of Addenda: 0 Note Initiated On: 03/19/2020 8:02 AM Total Procedure Duration: 0 hours 5 minutes 26 seconds       [facility]

## 2020-03-20 LAB — URINE CULTURE: Culture: 30000 — AB

## 2020-03-20 NOTE — Anesthesia Postprocedure Evaluation (Signed)
Anesthesia Post Note  [name], MD  Procedure(s) Performed: ESOPHAGOGASTRODUODENOSCOPY (EGD) (N/A ) COLONOSCOPY WITH PROPOFOL (N/A ) COLONOSCOPY  Patient location during evaluation: PACU Anesthesia Type: General Level of consciousness: awake and alert Pain management: pain level controlled Vital Signs Assessment: post-procedure vital signs reviewed and stable Respiratory status: spontaneous breathing, nonlabored ventilation, respiratory function stable and patient connected to nasal cannula oxygen Cardiovascular status: blood pressure returned to baseline and stable Postop Assessment: no apparent nausea or vomiting Anesthetic complications: no   No complications documented.   Last Vitals:  Vitals:   03/19/20 0955 03/19/20 1235  BP: (!) 143/74 121/63  Pulse: 65 84  Resp: 15 14  Temp: 36.8 C 36.4 C  SpO2: 98% 99%    Last Pain:  Vitals:   03/19/20 1235  TempSrc: Oral  PainSc:                  Molli Barrows

## 2020-03-21 ENCOUNTER — Encounter: Payer: Self-pay | Admitting: Gastroenterology

## 2020-03-21 ENCOUNTER — Telehealth: Payer: Self-pay | Admitting: Internal Medicine

## 2020-03-21 ENCOUNTER — Telehealth: Payer: Self-pay | Admitting: Cardiovascular Disease

## 2020-03-21 MED ORDER — TRAZODONE HCL 150 MG PO TABS: ORAL_TABLET | ORAL | 1 refills | Status: DC

## 2020-03-21 MED ORDER — AMITRIPTYLINE HCL 25 MG PO TABS: ORAL_TABLET | ORAL | 5 refills | Status: DC

## 2020-03-21 NOTE — Telephone Encounter (Signed)
Spoke with patient and she had some bleeding in the sigmoid area but it had resolved. She states that they took her off of the plavix and has been taking fluid pill furosemide 40 mg twice a day. She states this was in response to echo that showed some heart failure. She would like to know if she needs twice a day still due to increased output. She is now in assisted living at [facility] and no longer at home. She was getting some things mixed up and with her gait being unsteady she needed more assistance. So in summary she wants to know if she should stay off of plavix and if she still needs to be on Furosemide twice a day. Let her know that I would run by you and be in touch with recommendations. She verbalized understanding with no further questions at this time.

## 2020-03-21 NOTE — Addendum Note (Signed)
Addended byElpidio Galea T on: 03/21/2020 02:25 PM   Modules accepted: Orders

## 2020-03-21 NOTE — Telephone Encounter (Signed)
Patient reports taking lasix and is now down to 177    Patient also concerned about recent visit for rectal bleeding and currently taking anti coag Eliquis .    Please call to discuss.

## 2020-03-21 NOTE — Telephone Encounter (Signed)
Pt needs refills traZODone (DESYREL) 150 MG tablet  and amitriptyline (ELAVIL) 25 MG tablet sent to [facility]

## 2020-03-22 ENCOUNTER — Telehealth: Payer: Self-pay

## 2020-03-22 LAB — SURGICAL PATHOLOGY

## 2020-03-22 NOTE — Telephone Encounter (Signed)
Transition Care Management Follow-up Telephone Call  Date of discharge and from where: 03/19/20 from [facility]  How have you been since you were released from the hospital? " I have had no bleeding since returning home, taking Miralax, BM appropriate. Staying hydrated. Denies abd pain, chest pain, blood in stool, headache, dizziness and all other symptoms. Uncertain if she will accept PT. Currently walking a quarter mile daily which takes her about 20 minutes now.   Any questions or concerns? None.  Items Reviewed:  Did the pt receive and understand the discharge instructions provided? Increase activity as tolerated.   Medications obtained and verified? Yes, taking all medications as directed.  Any new allergies since your discharge? No  Dietary orders reviewed? Heart healthy.  Do you have support at home? Yes, assisted living at [facility].  Functional Questionnaire: (I = Independent and D = Dependent)  Bathing/Dressing- d- assist with compression stockings  Meal Prep- d- staff assist  Eating- i  Maintaining continence- i  Transferring/Ambulation- Rollator  Managing Meds- d- staff assists   Follow up appointments reviewed:   PCP Hospital f/u appt confirmed? Scheduled to see Dr. Nicki Reaper on 04/01/20 @ 12:00.  [facility] f/u appt confirmed? Scheduled to see GI and Cardiologist 05/2020 but plans schedule for a sooner date if possible. Offered to assist in rescheduling, declined. Encouraged to call the office if we can assist in any way.   Are transportation arrangements needed? Yes, she will contact [facility].  If their condition worsens, is the pt aware to call PCP or go to the Emergency Dept.? Yes  Was the patient provided with contact information for the PCP's office or ED? Yes  Was to pt encouraged to call back with questions or concerns? Yes

## 2020-03-24 ENCOUNTER — Encounter: Payer: Self-pay | Admitting: Internal Medicine

## 2020-03-24 LAB — COMPREHENSIVE METABOLIC PANEL
Albumin: 4.2 (ref 3.5–5.0)
Calcium: 9.6 (ref 8.7–10.7)
GFR calc non Af Amer: 56
Globulin: 2

## 2020-03-24 LAB — CBC AND DIFFERENTIAL
HCT: 35 — AB (ref 36–46)
Hemoglobin: 11.7 — AB (ref 12.0–16.0)
Neutrophils Absolute: 2470
Platelets: 268 (ref 150–399)
WBC: 4.2

## 2020-03-24 LAB — HEPATIC FUNCTION PANEL
ALT: 20 (ref 7–35)
AST: 20 (ref 13–35)
Alkaline Phosphatase: 78 (ref 25–125)
Bilirubin, Total: 0.6

## 2020-03-24 LAB — BASIC METABOLIC PANEL
BUN: 19 (ref 4–21)
CO2: 31 — AB (ref 13–22)
Chloride: 96 — AB (ref 99–108)
Creatinine: 1 (ref 0.5–1.1)
Glucose: 99
Potassium: 4 (ref 3.4–5.3)
Sodium: 134 — AB (ref 137–147)

## 2020-03-24 LAB — CBC: RBC: 3.38 — AB (ref 3.87–5.11)

## 2020-03-25 LAB — H PYLORI, IGM, IGG, IGA AB
H Pylori IgG: 0.44 Index Value (ref 0.00–0.79)
H. Pylogi, Iga Abs: <9 units (ref 0.0–8.9)
H. Pylogi, Igm Abs: <9 units (ref 0.0–8.9)

## 2020-03-27 NOTE — Telephone Encounter (Signed)
Stay on the Lasix twice a day Renal function 1 week ago normal Okay to hold the Plavix but stay on Eliquis

## 2020-03-29 ENCOUNTER — Telehealth: Payer: Self-pay

## 2020-03-29 NOTE — Telephone Encounter (Signed)
Called patient to inform her that her H Pylori test was negative. However, if she had any questions, to contact her physician Dr. Alice Reichert.

## 2020-03-29 NOTE — Telephone Encounter (Signed)
Called patient and left her a detailed message with her results. I told her to call me back if she had any questions.

## 2020-03-29 NOTE — Telephone Encounter (Signed)
Reviewed provider recommendations to patient and she verbalized understanding with no further questions at this time.

## 2020-03-29 NOTE — Telephone Encounter (Signed)
-----   Message from Virgel Manifold, MD sent at 03/28/2020  8:13 AM EDT ----- Charlotte Wagner please let the patient know, her H pylori serology was negative. Follow up with Dr. Alice Reichert in clinic

## 2020-03-29 NOTE — Telephone Encounter (Signed)
-----   Message from Virgel Manifold, MD sent at 03/25/2020  9:48 AM EDT ----- Charlotte Wagner results to her outpatient GI, Gerarda Gunther. Pt is following up with them.   [place] Coldren please let Dr. Delene Loll know her biopsy results showed focal inflammation in the sigmoid colon. This can happen with medications like NSAIDs and she should avoid these. Follow up with Dr. Ricky Stabs clinic for any follow up needed for this

## 2020-03-29 NOTE — Telephone Encounter (Signed)
Left voicemail message to call back  

## 2020-03-31 DIAGNOSIS — L438 Other lichen planus: Secondary | ICD-10-CM | POA: Insufficient documentation

## 2020-03-31 DIAGNOSIS — Z8719 Personal history of other diseases of the digestive system: Secondary | ICD-10-CM

## 2020-04-01 ENCOUNTER — Ambulatory Visit (INDEPENDENT_AMBULATORY_CARE_PROVIDER_SITE_OTHER): Payer: Medicare Other | Admitting: Internal Medicine

## 2020-04-01 ENCOUNTER — Other Ambulatory Visit: Payer: Self-pay

## 2020-04-01 DIAGNOSIS — I10 Essential (primary) hypertension: Secondary | ICD-10-CM

## 2020-04-01 DIAGNOSIS — R739 Hyperglycemia, unspecified: Secondary | ICD-10-CM

## 2020-04-01 DIAGNOSIS — M4800 Spinal stenosis, site unspecified: Secondary | ICD-10-CM

## 2020-04-01 DIAGNOSIS — E782 Mixed hyperlipidemia: Secondary | ICD-10-CM | POA: Diagnosis not present

## 2020-04-01 DIAGNOSIS — Z860100 Personal history of colon polyps, unspecified: Secondary | ICD-10-CM

## 2020-04-01 DIAGNOSIS — L439 Lichen planus, unspecified: Secondary | ICD-10-CM | POA: Diagnosis not present

## 2020-04-01 DIAGNOSIS — I5032 Chronic diastolic (congestive) heart failure: Secondary | ICD-10-CM

## 2020-04-01 DIAGNOSIS — K2100 Gastro-esophageal reflux disease with esophagitis, without bleeding: Secondary | ICD-10-CM | POA: Diagnosis not present

## 2020-04-01 DIAGNOSIS — R21 Rash and other nonspecific skin eruption: Secondary | ICD-10-CM

## 2020-04-01 DIAGNOSIS — I4819 Other persistent atrial fibrillation: Secondary | ICD-10-CM | POA: Diagnosis not present

## 2020-04-01 DIAGNOSIS — D649 Anemia, unspecified: Secondary | ICD-10-CM

## 2020-04-01 NOTE — Progress Notes (Signed)
Patient ID: Charlotte Sessions, MD, female   DOB: Apr 29, 1936, 84 y.o.   MRN: [id number]   Subjective:    Patient ID: Charlotte Sessions, MD, female    DOB: [birth year], 84 y.o.   MRN: [id number]  HPI This visit occurred during the SARS-CoV-2 public health emergency.  Safety protocols were in place, including screening questions prior to the visit, additional usage of staff PPE, and extensive cleaning of exam room while observing appropriate contact time as indicated for disinfecting solutions.  Patient here for a scheduled hospital follow up. Admitted 03/16/20 - 03/19/20 with GI bleed.  Received IV PPI twice a day and was changed to oral on discharge.  plavix stopped.  eliquis restarted prior to discharge.  EGD 03/19/20 - erythematous mucosa in the antrum.  H. Pylori negative.  Colonoscopy 03/19/20 - altered vascular and thickened folds of the mucosa of the ascending colon and erythematous mucosa in the sigmoid colon.  Diverticulosis.  No evidence of diverticular bleeding.  Initially presented with dehydration.  Lasix and ramipril initially held.  Blood pressure improved.  Lasix and ramipril resumed at discharge.  Reports since her discharge, she has had no further bleeding.  She is ambulating with her walker.  [place]  No chest pain.  Breathing overall relatively stable.  Eating.  No nausea or vomiting.  No abdominal pain.  Bowels moving.  She has had increased itching.  Described as all over.  Rash scalp and neck.  Circular lesions - back.  Erythematous skin lesions.  Mouth irritated.  Request refill for clotrimazole troches. Sees Dr Kellie Moor.    Past Medical History:  Diagnosis Date  . Allergy   . Arthritis    s/p bilateral knees and left hip replacement  . Asthma   . CAD (coronary artery disease)    a. 12/1996 s/p CABG x4 ([place]);  b. 2005 Pt reports stress test & cath, which revealed patent grafts.  . Cancer ([place])    melanoma right arm  . Carotid arterial disease ([place])     a. 04/2015 Carotid U/S: <50% bilat ICA stenosis.  . Chronic kidney disease   . Colon polyps    H/O  . Depression   . GERD (gastroesophageal reflux disease)    h/o hiatal hernia  . Headache    migraines in past  . Heart murmur    a. 04/2011 Echo: EF 55-60%, bilat atrial enlargement, mild to mod TR.  [name] History of chicken pox   . History of hiatal hernia   . Hx of migraines    rare now  . Hx: UTI (urinary tract infection)   . Hyperlipidemia    a. Statin intolerant -->on zetia.  . Hypertension   . Hypertensive heart disease   . Lichen planus   . Melanoma ([place])   . Palpitations    a. rare PVC's and h/o SVT.  [name] PMR (polymyalgia rheumatica) (HCC)    h/o in setting of crestor usage.  [name] PSVT (paroxysmal supraventricular tachycardia) ([place])   . PUD (peptic ulcer disease)    remote history  . Raynaud's phenomenon   . Spinal stenosis   . Urine incontinence    H/O  . Vitamin D deficiency    Past Surgical History:  Procedure Laterality Date  . ADENOIDECTOMY     age 52  . BACK SURGERY     L3-L5  . BILATERAL CARPAL TUNNEL RELEASE    . BREAST BIOPSY Right    bx  x 3 neg  . BREAST SURGERY Right    biopsy x 3 (all benign)  . CARDIAC CATHETERIZATION N/A 06/01/2016   Procedure: LEFT HEART CATH AND CORS/GRAFTS ANGIOGRAPHY;  Surgeon: Wellington Hampshire, MD;  Location: [place] [facility];  Service: Cardiovascular;  Laterality: N/A;  . CARDIAC CATHETERIZATION N/A 06/01/2016   Procedure: Coronary Stent Intervention;  Surgeon: Wellington Hampshire, MD;  Location: [place] [facility];  Service: Cardiovascular;  Laterality: N/A;  . CARDIOVERSION N/A 03/08/2017   Procedure: CARDIOVERSION;  Surgeon: Wellington Hampshire, MD;  Location: [facility];  Service: Cardiovascular;  Laterality: N/A;  . CATARACT EXTRACTION W/PHACO Right 01/04/2016   Procedure: CATARACT EXTRACTION PHACO AND INTRAOCULAR LENS PLACEMENT ([place]);  Surgeon: Leandrew Koyanagi, MD;  Location: [place];  Service: Ophthalmology;   Laterality: Right;  MALYUGIN  . CATARACT EXTRACTION W/PHACO Left 01/25/2016   Procedure: CATARACT EXTRACTION PHACO AND INTRAOCULAR LENS PLACEMENT ([place]) left eye;  Surgeon: Leandrew Koyanagi, MD;  Location: [place];  Service: Ophthalmology;  Laterality: Left;  MALYUGIN SHUGARCAINE  . CHOLECYSTECTOMY  90's  . COLONOSCOPY  03/19/2020   Procedure: COLONOSCOPY;  Surgeon: Virgel Manifold, MD;  Location: [facility] ENDOSCOPY;  Service: Gastroenterology;;  . COLONOSCOPY WITH PROPOFOL N/A 05/03/2015   Procedure: COLONOSCOPY WITH PROPOFOL;  Surgeon: Lollie Sails, MD;  Location[facility];  Service: Endoscopy;  Laterality: N/A;  . COLONOSCOPY WITH PROPOFOL N/A 03/19/2020   Procedure: COLONOSCOPY WITH PROPOFOL;  Surgeon: Virgel Manifold, MD;  Location: [facility] ENDOSCOPY;  Service: Endoscopy;  Laterality: N/A;  . CORONARY ARTERY BYPASS GRAFT  98   4 vessel  . CORONARY STENT INTERVENTION N/A 03/31/2018   Procedure: CORONARY STENT INTERVENTION;  Surgeon: Wellington Hampshire, MD;  Location: [place] [facility];  Service: Cardiovascular;  Laterality: N/A;  . ESOPHAGOGASTRODUODENOSCOPY N/A 03/22/2015   Procedure: ESOPHAGOGASTRODUODENOSCOPY (EGD);  Surgeon: Lollie Sails, MD;  Location[facility] END[facility]COPY;  Service: Endoscopy;  Laterality: N/A;  . ESOPHAGOGASTRODUODENOSCOPY N/A 03/19/2020   Procedure: ESOPHAGOGASTRODUODENOSCOPY (EGD);  Surgeon: Virgel Manifold, MD;  Location[facility] END[facility]COPY;  Service: Endoscopy;  Laterality: N/A;  . HEMORRHOID BANDING    . HEMORRHOID SURGERY N/A 02/17/2018   Procedure: HEMORRHOIDECTOMY;  Surgeon: Robert Bellow, MD;  Location: [facility];  Service: General;  Laterality: N/A;  . JOINT REPLACEMENT     BILATERAL KNEE REPLACEMENTS  . KNEE ARTHROSCOPY W/ OATS PROCEDURE     Lt knee (9/01), Rt knee (3/11), Lt hip (5/10)  . LEFT HEART CATH AND CORONARY ANGIOGRAPHY N/A 03/31/2018   Procedure: LEFT HEART CATH AND CORONARY ANGIOGRAPHY;  Surgeon: Wellington Hampshire,  MD;  Location: [place] [facility];  Service: Cardiovascular;  Laterality: N/A;  . TOTAL HIP ARTHROPLASTY     Family History  Problem Relation Age of Onset  . Thyroid disease Mother        graves disease  . Heart disease Father        rheumatic heart  . Alcohol abuse Father   . Depression Father   . Lung cancer Sister   . Depression Daughter   . Thyroid disease Daughter        hashimoto  . Depression Son   . Stroke Maternal Grandmother   . Depression Maternal Grandmother   . Hypertension Maternal Grandfather   . Hypertension Paternal Grandfather   . Seizures Son   . Breast cancer Neg Hx    Social History   Socioeconomic History  . Marital status: Divorced    Spouse name:  Not on file  . Number of children: 3  . Years of education: Not on file  . Highest education level: Not on file  Occupational History  . Occupation: Retired Sport and exercise psychologist  Tobacco Use  . Smoking status: Former Smoker    Types: Cigarettes  . Smokeless tobacco: Never Used  . Tobacco comment: quit 47+ yrs ago  Vaping Use  . Vaping Use: Never used  Substance and Sexual Activity  . Alcohol use: No    Alcohol/week: 0.0 standard drinks  . Drug use: No  . Sexual activity: Never  Other Topics Concern  . Not on file  Social History Narrative   Lives in [place].  Retired Engineer, drilling.  Relatively active.   Uses a Rollator to ambulate   Social Determinants of Health   Financial Resource Strain:   . Difficulty of Paying Living Expenses:   Food Insecurity:   . Worried About Charity fundraiser in the Last Year:   . Arboriculturist in the Last Year:   Transportation Needs:   . Film/video editor (Medical):   [name] Lack of Transportation (Non-Medical):   Physical Activity:   . Days of Exercise per Week:   . Minutes of Exercise per Session:   Stress:   . Feeling of Stress :   Social Connections:   . Frequency of Communication with Friends and Family:   . Frequency of Social Gatherings with  Friends and Family:   . Attends Religious Services:   . Active Member of Clubs or Organizations:   . Attends Archivist Meetings:   [name] Marital Status:     Outpatient Encounter Medications as of 04/01/2020  Medication Sig  . acetaminophen (TYLENOL) 500 MG tablet Take 1,000 mg by mouth 2 (two) times daily.   [name] amitriptyline (ELAVIL) 25 MG tablet TAKE 1 TABLET BY MOUTH AT BEDTIME  . apixaban (ELIQUIS) 5 MG TABS tablet TAKE 1 TABLET(5 MG) BY MOUTH TWICE DAILY  . b complex vitamins tablet Take 1 tablet by mouth daily.  . Biotin w/ Vitamins C & E 1250-7.5-7.5 MCG-MG-UNT CHEW Chew by mouth.  . Cholecalciferol (VITAMIN D3) 2000 UNITS TABS Take 2,000 Units by mouth 2 (two) times a week.   . clotrimazole (MYCELEX) 10 MG troche SMARTSIG:1 Lozenge(s) By Mouth 5 Times Daily  . Coenzyme Q10 (COQ-10) 200 MG CAPS Take 200 mg by mouth daily.   [name] conjugated estrogens (PREMARIN) vaginal cream Apply 0.'5mg'$  (pea-sized amount)  just inside the vaginal introitus with a finger-tip on  Monday, Wednesday and Friday nights. (Patient taking differently: as needed. Apply 0.'5mg'$  (pea-sized amount)  just inside the vaginal introitus with a finger-tip on  Monday, Wednesday and Friday nights.)  . CRANBERRY PO Take 1 capsule by mouth daily.  [name] ezetimibe (ZETIA) 10 MG tablet TAKE 1 TABLET(10 MG) BY MOUTH DAILY  . furosemide (LASIX) 40 MG tablet Take 1 tablet (40 mg total) by mouth 2 (two) times daily.  . hydrocortisone 2.5 % cream Apply to VULVA at bedtime as needed with miconazole for lichen planus  . loratadine (CLARITIN) 10 MG tablet Take 10 mg by mouth daily.  . Multiple Vitamins-Minerals (HAIR/SKIN/NAILS PO) Take 1 tablet by mouth daily.  . mupirocin ointment (BACTROBAN) 2 % Apply to affected area bid (Patient taking differently: Apply to affected area bid as needed)  . pantoprazole (PROTONIX) 40 MG tablet TAKE 1 TABLET(40 MG) BY MOUTH TWICE DAILY  . polyethylene glycol powder (GLYCOLAX/MIRALAX) 17 GM/SCOOP powder  Take 8.5  g by mouth daily. Hold if diarrhea  . PREVIDENT 5000 BOOSTER PLUS 1.1 % PSTE USE TID AFTER MEALS . DO NOT RINSE  . ramipril (ALTACE) 5 MG capsule TAKE 1 CAPSULE(5 MG) BY MOUTH DAILY  . REPATHA SURECLICK 106 MG/ML SOAJ INJECT 1 PEN AS DIRECTED EVERY 14 DAYS  . spironolactone (ALDACTONE) 25 MG tablet TAKE 1 TABLET(25 MG) BY MOUTH EVERY MORNING  . traMADol (ULTRAM) 50 MG tablet TAKE 1 TABLET(50 MG) BY MOUTH TWICE DAILY AS NEEDED  . traZODone (DESYREL) 150 MG tablet Take 1 tablet to  1and 1/3 tablets q hs  . verapamil (CALAN-SR) 180 MG CR tablet TAKE 1 TABLET(180 MG) BY MOUTH TWICE DAILY  . vitamin B-12 (CYANOCOBALAMIN) 1000 MCG tablet Take 1,000 mcg by mouth daily.  . vitamin C (ASCORBIC ACID) 500 MG tablet Take 500 mg by mouth at bedtime.   No facility-administered encounter medications on file as of 04/01/2020.    Review of Systems  Constitutional: Negative for appetite change and unexpected weight change.  HENT: Negative for congestion and sinus pressure.   Respiratory: Negative for cough and chest tightness.        Breathing stable.    Cardiovascular: Negative for chest pain, palpitations and leg swelling.  Gastrointestinal: Negative for abdominal pain, nausea and vomiting.       No further bleeding.   Genitourinary: Negative for difficulty urinating and dysuria.  Musculoskeletal: Negative for joint swelling and myalgias.  Skin: Positive for rash. Negative for color change.  Neurological: Negative for dizziness, light-headedness and headaches.  Psychiatric/Behavioral: Negative for agitation and dysphoric mood.       Objective:    Physical Exam Vitals reviewed.  Constitutional:      General: She is not in acute distress.    Appearance: Normal appearance.  HENT:     Head: Normocephalic and atraumatic.     Right Ear: External ear normal.     Left Ear: External ear normal.  Eyes:     General:        Right eye: No discharge.        Left eye: No discharge.      Conjunctiva/sclera: Conjunctivae normal.  Neck:     Thyroid: No thyromegaly.  Cardiovascular:     Rate and Rhythm: Normal rate and regular rhythm.  Pulmonary:     Effort: No respiratory distress.     Breath sounds: Normal breath sounds. No wheezing.  Abdominal:     General: Bowel sounds are normal.     Palpations: Abdomen is soft.     Tenderness: There is no abdominal tenderness.  Musculoskeletal:        General: No swelling or tenderness.     Cervical back: Neck supple. No tenderness.  Lymphadenopathy:     Cervical: No cervical adenopathy.  Skin:    Findings: Rash present. No erythema.     Comments: Lesions - scalp, neck, back.    Neurological:     Mental Status: She is alert.  Psychiatric:        Mood and Affect: Mood normal.        Behavior: Behavior normal.     BP 100/62   Pulse 96   Temp 98.3 F (36.8 C)   Resp 16   Ht '5\' 3"'$  (1.6 m)   Wt 181 lb 6.4 oz (82.3 kg)   SpO2 98%   BMI 32.13 kg/m  Wt Readings from Last 3 Encounters:  04/01/20 181 lb 6.4 oz (82.3 kg)  03/19/20 183  lb (83 kg)  02/22/20 186 lb (84.4 kg)     Lab Results  Component Value Date   WBC 4.2 03/18/2020   HGB 11.4 (L) 03/19/2020   HCT 35.4 (L) 03/19/2020   PLT 200 03/18/2020   GLUCOSE 122 (H) 03/19/2020   CHOL 120 02/03/2020   TRIG 86 02/03/2020   HDL 55 02/03/2020   LDLDIRECT 126.7 07/03/2013   LDLCALC 48 02/03/2020   ALT 20 03/16/2020   AST 27 03/16/2020   NA 136 03/19/2020   K 4.1 03/19/2020   CL 102 03/19/2020   CREATININE 0.74 03/19/2020   BUN 6 (L) 03/19/2020   CO2 25 03/19/2020   TSH 2.004 02/03/2020   INR 1.34 03/28/2018   HGBA1C 5.7 (H) 02/03/2020       Assessment & Plan:   Problem List Items Addressed This Visit    Anemia    Recent admission for GI bleed as outlined.  No bleeding since discharge.  Recent hgb check after discharge 11.7.  Follow.       Atrial fibrillation ([place])    Followed by cardiology.  Remains on eliquis.  Stable.       Chronic diastolic  heart failure (HCC)    No evidence of volume overload on exam.  Continue ramipril, lasix and spironolactone. Follow.        Essential hypertension, benign    Blood pressure has been ok.  Continue verapamil, aldactone, lasix and ramipril.  Follow pressures.  Follow metabolic panel.        GERD (gastroesophageal reflux disease)    No upper symptoms reported.  On protonix.        History of colonic polyps    Recent colonoscopy 03/19/20 - as outlined.  No further bleeding.  Follow.  hgb just rechecked 03/24/20 - 11.7.  Follow.        Hyperglycemia    Low carb diet and exercise.  Follow met b and a1c.        Hyperlipidemia    Intolerant to statin medication.  On zetia and repatha.   Low cholesterol diet and exercise.  Follow lipid panel.        Lichen planus    Reports increased issues.  Request earlier appt with Dr Kellie Moor.        Relevant Orders   Ambulatory referral to Dermatology   Rash    Rash as outlined.  Sees Dr Kellie Moor.  Get earlier appt for evaluation.        Relevant Orders   Ambulatory referral to Dermatology   Spinal stenosis    Chronic back and leg pain.  Uses her walker to ambulate.  [place]  Desires not to have PT.  Follow.            [name], MD

## 2020-04-03 ENCOUNTER — Encounter: Payer: Self-pay | Admitting: Internal Medicine

## 2020-04-03 DIAGNOSIS — R21 Rash and other nonspecific skin eruption: Secondary | ICD-10-CM | POA: Insufficient documentation

## 2020-04-03 NOTE — Assessment & Plan Note (Signed)
Reports increased issues.  Request earlier appt with Dr Kellie Moor.

## 2020-04-03 NOTE — Assessment & Plan Note (Signed)
Followed by cardiology.  Remains on eliquis.  Stable.

## 2020-04-03 NOTE — Assessment & Plan Note (Signed)
No evidence of volume overload on exam.  Continue ramipril, lasix and spironolactone. Follow.

## 2020-04-03 NOTE — Assessment & Plan Note (Signed)
Low carb diet and exercise.  Follow met b and a1c.   

## 2020-04-03 NOTE — Assessment & Plan Note (Signed)
Blood pressure has been ok.  Continue verapamil, aldactone, lasix and ramipril.  Follow pressures.  Follow metabolic panel.

## 2020-04-03 NOTE — Assessment & Plan Note (Signed)
No upper symptoms reported.  On protonix.   

## 2020-04-03 NOTE — Assessment & Plan Note (Signed)
Rash as outlined.  Sees Dr Kellie Moor.  Get earlier appt for evaluation.

## 2020-04-03 NOTE — Assessment & Plan Note (Signed)
Chronic back and leg pain.  Uses her walker to ambulate.  [place]  Desires not to have PT.  Follow.

## 2020-04-03 NOTE — Assessment & Plan Note (Signed)
Recent admission for GI bleed as outlined.  No bleeding since discharge.  Recent hgb check after discharge 11.7.  Follow.

## 2020-04-03 NOTE — Assessment & Plan Note (Addendum)
Recent colonoscopy 03/19/20 - as outlined.  No further bleeding.  Follow.  hgb just rechecked 03/24/20 - 11.7.  Follow.

## 2020-04-03 NOTE — Assessment & Plan Note (Addendum)
Intolerant to statin medication.  On zetia and repatha.   Low cholesterol diet and exercise.  Follow lipid panel.

## 2020-04-07 ENCOUNTER — Other Ambulatory Visit: Payer: Self-pay | Admitting: Cardiovascular Disease

## 2020-04-07 MED ORDER — SPIRONOLACTONE 25 MG PO TABS: ORAL_TABLET | ORAL | 0 refills | Status: DC

## 2020-04-07 MED ORDER — EZETIMIBE 10 MG PO TABS: ORAL_TABLET | ORAL | 0 refills | Status: DC

## 2020-04-07 NOTE — Telephone Encounter (Signed)
*  STAT* If patient is at the pharmacy, call can be transferred to refill team.   1. Which medications need to be refilled? (please list name of each medication and dose if known)  ezetimibe (ZETIA) 10 MG 1 tablet daily  Spironolactone (ALDACTONE) 25 MG 1 tablet every morning    2. Which pharmacy/location (including street and city if local pharmacy) is medication to be sent to? [facility]   3. Do they need a 30 day or 90 day supply? 90 day

## 2020-04-07 NOTE — Telephone Encounter (Signed)
Requested Prescriptions   Signed Prescriptions Disp Refills   ezetimibe (ZETIA) 10 MG tablet 90 tablet 0    Sig: TAKE 1 TABLET(10 MG) BY MOUTH DAILY    Authorizing Provider: Minna Merritts    Ordering User: NEWCOMER MCCLAIN, Kiari Hosmer L   spironolactone (ALDACTONE) 25 MG tablet 90 tablet 0    Sig: TAKE 1 TABLET(25 MG) BY MOUTH EVERY MORNING    Authorizing Provider: Minna Merritts    Ordering User: NEWCOMER MCCLAIN, Kenetra Hildenbrand L

## 2020-04-11 ENCOUNTER — Other Ambulatory Visit: Payer: Self-pay | Admitting: Cardiovascular Disease

## 2020-04-19 ENCOUNTER — Other Ambulatory Visit: Payer: Self-pay | Admitting: Internal Medicine

## 2020-04-19 NOTE — Telephone Encounter (Signed)
Refill request for tramadol, last seen 04-01-20, last filled 02-17-20.  Please advise.

## 2020-04-20 ENCOUNTER — Other Ambulatory Visit: Payer: Self-pay | Admitting: Internal Medicine

## 2020-04-20 ENCOUNTER — Other Ambulatory Visit: Payer: Self-pay

## 2020-04-20 MED ORDER — RAMIPRIL 5 MG PO CAPS: ORAL_CAPSULE | ORAL | 3 refills | Status: DC

## 2020-04-20 NOTE — Telephone Encounter (Signed)
Refills on Tramadol

## 2020-04-20 NOTE — Telephone Encounter (Signed)
*  STAT* If patient is at the pharmacy, call can be transferred to refill team.   1. Which medications need to be refilled? (please list name of each medication and dose if known) Ramipril  2. Which pharmacy/location (including street and city if local pharmacy) is medication to be sent to? [facility]  3. Do they need a 30 day or 90 day supply? [place]

## 2020-04-20 NOTE — Telephone Encounter (Signed)
Refill request for tramadol, last seen 04-01-20.  Please advise.

## 2020-04-21 NOTE — Telephone Encounter (Signed)
Ordered by Derrel Nip 04/20/20

## 2020-05-10 ENCOUNTER — Other Ambulatory Visit: Payer: Self-pay

## 2020-05-10 MED ORDER — VERAPAMIL HCL ER 180 MG PO TBCR: EXTENDED_RELEASE_TABLET | ORAL | 3 refills | Status: DC

## 2020-05-12 ENCOUNTER — Other Ambulatory Visit: Payer: Self-pay | Admitting: Internal Medicine

## 2020-05-12 NOTE — Telephone Encounter (Signed)
Refill request for tramadol, last seen 04-01-20, last filled 04-20-20.  Please advise.

## 2020-05-13 NOTE — Telephone Encounter (Signed)
rx sent in for tramadol #30 with one refill.

## 2020-05-25 ENCOUNTER — Other Ambulatory Visit: Payer: Self-pay | Admitting: Internal Medicine

## 2020-05-26 ENCOUNTER — Ambulatory Visit (INDEPENDENT_AMBULATORY_CARE_PROVIDER_SITE_OTHER): Payer: Medicare Other

## 2020-05-26 VITALS — Ht 63.0 in | Wt 181.0 lb

## 2020-05-26 DIAGNOSIS — Z0000 Encounter for general adult medical examination without abnormal findings: Secondary | ICD-10-CM

## 2020-05-26 NOTE — Patient Instructions (Addendum)
Charlotte Wagner , Thank you for taking time to come for your Medicare Wellness Visit. I appreciate your ongoing commitment to your health goals. Please review the following plan we discussed and let me know if I can assist you in the future.   These are the goals we discussed: Goals      Patient Stated   .  Monitor sodium intake (pt-stated)       This is a list of the screening recommended for you and due dates:  Health Maintenance  Topic Date Due  . Flu Shot  08/26/2020*  . Mammogram  05/26/2021*  . DEXA scan (bone density measurement)  05/26/2021*  . Tetanus Vaccine  05/26/2021*  . Colon Cancer Screening  03/19/2025  . COVID-19 Vaccine  Completed  . Pneumonia vaccines  Completed  *Topic was postponed. The date shown is not the original due date.    Immunizations Immunization History  Administered Date(s) Administered  . Influenza, High Dose Seasonal PF 06/14/2016, 07/08/2018, 07/16/2019  . Influenza,inj,Quad PF,6+ Mos 06/15/2014  . Influenza-Unspecified 07/13/2015  . Moderna SARS-COVID-2 Vaccination 10/22/2019, 11/19/2019  . Pneumococcal Conjugate-13 01/13/2015  . Pneumococcal Polysaccharide-23 06/21/2009  . Tdap 02/22/2010  . Zoster 11/15/2010   Keep all routine maintenance appointments.   Follow up 07/04/20 @ 2:30  Advanced directives: End of life planning; Advance aging; Advanced directives discussed.  Copy of current HCPOA/Living Will requested.    Conditions/risks identified: none new  Follow up in one year for your annual wellness visit.   Preventive Care 82 Years and Older, Female Preventive care refers to lifestyle choices and visits with your health care provider that can promote health and wellness. What does preventive care include?  A yearly physical exam. This is also called an annual well check.  Dental exams once or twice a year.  Routine eye exams. Ask your health care provider how often you should have your eyes checked.  Personal lifestyle  choices, including:  Daily care of your teeth and gums.  Regular physical activity.  Eating a healthy diet.  Avoiding tobacco and drug use.  Limiting alcohol use.  Practicing safe sex.  Taking low-dose aspirin every day.  Taking vitamin and mineral supplements as recommended by your health care provider. What happens during an annual well check? The services and screenings done by your health care provider during your annual well check will depend on your age, overall health, lifestyle risk factors, and family history of disease. Counseling  Your health care provider may ask you questions about your:  Alcohol use.  Tobacco use.  Drug use.  Emotional well-being.  Home and relationship well-being.  Sexual activity.  Eating habits.  History of falls.  Memory and ability to understand (cognition).  Work and work Statistician.  Reproductive health. Screening  You may have the following tests or measurements:  Height, weight, and BMI.  Blood pressure.  Lipid and cholesterol levels. These may be checked every 5 years, or more frequently if you are over 82 years old.  Skin check.  Lung cancer screening. You may have this screening every year starting at age 28 if you have a 30-pack-year history of smoking and currently smoke or have quit within the past 15 years.  Fecal occult blood test (FOBT) of the stool. You may have this test every year starting at age 82.  Flexible sigmoidoscopy or colonoscopy. You may have a sigmoidoscopy every 5 years or a colonoscopy every 10 years starting at age 9.  Hepatitis C blood test.  Hepatitis B blood test.  Sexually transmitted disease (STD) testing.  Diabetes screening. This is done by checking your blood sugar (glucose) after you have not eaten for a while (fasting). You may have this done every 1-3 years.  Bone density scan. This is done to screen for osteoporosis. You may have this done starting at age  82.  Mammogram. This may be done every 1-2 years. Talk to your health care provider about how often you should have regular mammograms. Talk with your health care provider about your test results, treatment options, and if necessary, the need for more tests. Vaccines  Your health care provider may recommend certain vaccines, such as:  Influenza vaccine. This is recommended every year.  Tetanus, diphtheria, and acellular pertussis (Tdap, Td) vaccine. You may need a Td booster every 10 years.  Zoster vaccine. You may need this after age 82.  Pneumococcal 13-valent conjugate (PCV13) vaccine. One dose is recommended after age 82.  Pneumococcal polysaccharide (PPSV23) vaccine. One dose is recommended after age 82. Talk to your health care provider about which screenings and vaccines you need and how often you need them. This information is not intended to replace advice given to you by your health care provider. Make sure you discuss any questions you have with your health care provider. Document Released: 10/21/2015 Document Revised: 06/13/2016 Document Reviewed: 07/26/2015 Elsevier Interactive Patient Education  2017 [employer] Prevention in the Home Falls can cause injuries. They can happen to people of all ages. There are many things you can do to make your home safe and to help prevent falls. What can I do on the outside of my home?  Regularly fix the edges of walkways and driveways and fix any cracks.  Remove anything that might make you trip as you walk through a door, such as a raised step or threshold.  Trim any bushes or trees on the path to your home.  Use bright outdoor lighting.  Clear any walking paths of anything that might make someone trip, such as rocks or tools.  Regularly check to see if handrails are loose or broken. Make sure that both sides of any steps have handrails.  Any raised decks and porches should have guardrails on the edges.  Have any  leaves, snow, or ice cleared regularly.  Use sand or salt on walking paths during winter.  Clean up any spills in your garage right away. This includes oil or grease spills. What can I do in the bathroom?  Use night lights.  Install grab bars by the toilet and in the tub and shower. Do not use towel bars as grab bars.  Use non-skid mats or decals in the tub or shower.  If you need to sit down in the shower, use a plastic, non-slip stool.  Keep the floor dry. Clean up any water that spills on the floor as soon as it happens.  Remove soap buildup in the tub or shower regularly.  Attach bath mats securely with double-sided non-slip rug tape.  Do not have throw rugs and other things on the floor that can make you trip. What can I do in the bedroom?  Use night lights.  Make sure that you have a light by your bed that is easy to reach.  Do not use any sheets or blankets that are too big for your bed. They should not hang down onto the floor.  Have a firm chair that has side arms. You can use this  for support while you get dressed.  Do not have throw rugs and other things on the floor that can make you trip. What can I do in the kitchen?  Clean up any spills right away.  Avoid walking on wet floors.  Keep items that you use a lot in easy-to-reach places.  If you need to reach something above you, use a strong step stool that has a grab bar.  Keep electrical cords out of the way.  Do not use floor polish or wax that makes floors slippery. If you must use wax, use non-skid floor wax.  Do not have throw rugs and other things on the floor that can make you trip. What can I do with my stairs?  Do not leave any items on the stairs.  Make sure that there are handrails on both sides of the stairs and use them. Fix handrails that are broken or loose. Make sure that handrails are as long as the stairways.  Check any carpeting to make sure that it is firmly attached to the stairs.  Fix any carpet that is loose or worn.  Avoid having throw rugs at the top or bottom of the stairs. If you do have throw rugs, attach them to the floor with carpet tape.  Make sure that you have a light switch at the top of the stairs and the bottom of the stairs. If you do not have them, ask someone to add them for you. What else can I do to help prevent falls?  Wear shoes that:  Do not have high heels.  Have rubber bottoms.  Are comfortable and fit you well.  Are closed at the toe. Do not wear sandals.  If you use a stepladder:  Make sure that it is fully opened. Do not climb a closed stepladder.  Make sure that both sides of the stepladder are locked into place.  Ask someone to hold it for you, if possible.  Clearly mark and make sure that you can see:  Any grab bars or handrails.  First and last steps.  Where the edge of each step is.  Use tools that help you move around (mobility aids) if they are needed. These include:  Canes.  Walkers.  Scooters.  Crutches.  Turn on the lights when you go into a dark area. Replace any light bulbs as soon as they burn out.  Set up your furniture so you have a clear path. Avoid moving your furniture around.  If any of your floors are uneven, fix them.  If there are any pets around you, be aware of where they are.  Review your medicines with your doctor. Some medicines can make you feel dizzy. This can increase your chance of falling. Ask your doctor what other things that you can do to help prevent falls. This information is not intended to replace advice given to you by your health care provider. Make sure you discuss any questions you have with your health care provider. Document Released: 07/21/2009 Document Revised: 03/01/2016 Document Reviewed: 10/29/2014 Elsevier Interactive Patient Education  2017 [employer].

## 2020-05-26 NOTE — Progress Notes (Addendum)
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  Review of Systems    No ROS.  Medicare Wellness Virtual Visit.   Cardiac Risk Factors include: advanced age (>66men, >14 women);hypertension     Objective:    Today's Vitals   05/26/20 1236  Weight: 181 lb (82.1 kg)  Height: 5\' 3"  (1.6 m)   Body mass index is 32.06 kg/m.  Advanced Directives 05/26/2020 03/19/2020 03/17/2020 03/16/2020 05/26/2019 05/07/2018 03/31/2018  Does Patient Have a Medical Advance Directive? Yes Yes Yes Yes Yes Yes -  Type of Paramedic of [place];Living will - Out of facility DNR (pink MOST or yellow form) Out of facility DNR (pink MOST or yellow form) Healthcare Power of [place];Living will -  Does patient want to make changes to medical advance directive? No - Patient declined - No - Patient declined - No - Patient declined - No - Patient declined  Copy of [place] in Chart? No - copy requested - - - No - copy requested Yes -  Would patient like information on creating a medical advance directive? - - - - - - -  Pre-existing out of facility DNR order (yellow form or pink MOST form) - - - - - - -    Current Medications (verified) Outpatient Encounter Medications as of 05/26/2020  Medication Sig  . acetaminophen (TYLENOL) 500 MG tablet Take 1,000 mg by mouth 2 (two) times daily.   [name] amitriptyline (ELAVIL) 25 MG tablet TAKE 1 TABLET BY MOUTH AT BEDTIME  . apixaban (ELIQUIS) 5 MG TABS tablet TAKE 1 TABLET(5 MG) BY MOUTH TWICE DAILY  . b complex vitamins tablet Take 1 tablet by mouth daily.  . Biotin w/ Vitamins C & E 1250-7.5-7.5 MCG-MG-UNT CHEW Chew by mouth.  . Cholecalciferol (VITAMIN D3) 2000 UNITS TABS Take 2,000 Units by mouth 2 (two) times a week.   . clotrimazole (MYCELEX) 10 MG troche DISSLOVE 1 TABLET IN MOUTH 5 TIMES A DAYUNTIL FINISHED  . Coenzyme Q10  (COQ-10) 200 MG CAPS Take 200 mg by mouth daily.   [name] conjugated estrogens (PREMARIN) vaginal cream Apply 0.5mg  (pea-sized amount)  just inside the vaginal introitus with a finger-tip on  Monday, Wednesday and Friday nights. (Patient taking differently: as needed. Apply 0.5mg  (pea-sized amount)  just inside the vaginal introitus with a finger-tip on  Monday, Wednesday and Friday nights.)  . CRANBERRY PO Take 1 capsule by mouth daily.  [name] ezetimibe (ZETIA) 10 MG tablet TAKE 1 TABLET BY MOUTH DAILY  . furosemide (LASIX) 40 MG tablet Take 1 tablet (40 mg total) by mouth 2 (two) times daily.  . hydrocortisone 2.5 % cream Apply to VULVA at bedtime as needed with miconazole for lichen planus  . loratadine (CLARITIN) 10 MG tablet Take 10 mg by mouth daily.  . Multiple Vitamins-Minerals (HAIR/SKIN/NAILS PO) Take 1 tablet by mouth daily.  . mupirocin ointment (BACTROBAN) 2 % Apply to affected area bid (Patient taking differently: Apply to affected area bid as needed)  . pantoprazole (PROTONIX) 40 MG tablet TAKE 1 TABLET(40 MG) BY MOUTH TWICE DAILY  . polyethylene glycol powder (GLYCOLAX/MIRALAX) 17 GM/SCOOP powder Take 8.5 g by mouth daily. Hold if diarrhea  . PREVIDENT 5000 BOOSTER PLUS 1.1 % PSTE USE TID AFTER MEALS . DO NOT RINSE  . ramipril (ALTACE) 5 MG capsule TAKE 1 CAPSULE(5 MG) BY MOUTH DAILY  . REPATHA  SURECLICK 591 MG/ML SOAJ INJECT 1 PEN AS DIRECTED EVERY 14 DAYS  . spironolactone (ALDACTONE) 25 MG tablet TAKE 1 TABLET(25 MG) BY MOUTH EVERY MORNING  . traMADol (ULTRAM) 50 MG tablet TAKE ONE (1) TABLET BY MOUTH TWO TIMES PER DAY AS NEEDED  . traZODone (DESYREL) 150 MG tablet Take 1 tablet to  1and 1/3 tablets q hs  . verapamil (CALAN-SR) 180 MG CR tablet TAKE 1 TABLET(180 MG) BY MOUTH TWICE DAILY  . vitamin B-12 (CYANOCOBALAMIN) 1000 MCG tablet Take 1,000 mcg by mouth daily.  . vitamin C (ASCORBIC ACID) 500 MG tablet Take 500 mg by mouth at bedtime.   No facility-administered encounter  medications on file as of 05/26/2020.    Allergies (verified) Beta adrenergic blockers; Brilinta [ticagrelor]; Albuterol; Antihistamines, diphenhydramine-type; Aspirin; Nsaids; Penicillins; Statins; Sulfa antibiotics; Sulfasalazine; Tetracycline; and Tetracyclines & related   History: Past Medical History:  Diagnosis Date  . Allergy   . Arthritis    s/p bilateral knees and left hip replacement  . Asthma   . CAD (coronary artery disease)    a. 12/1996 s/p CABG x4 ([place]);  b. 2005 Pt reports stress test & cath, which revealed patent grafts.  . Cancer ([place])    melanoma right arm  . Carotid arterial disease ([place])    a. 04/2015 Carotid U/S: <50% bilat ICA stenosis.  . Chronic kidney disease   . Colon polyps    H/O  . Depression   . GERD (gastroesophageal reflux disease)    h/o hiatal hernia  . Headache    migraines in past  . Heart murmur    a. 04/2011 Echo: EF 55-60%, bilat atrial enlargement, mild to mod TR.  [name] History of chicken pox   . History of hiatal hernia   . Hx of migraines    rare now  . Hx: UTI (urinary tract infection)   . Hyperlipidemia    a. Statin intolerant -->on zetia.  . Hypertension   . Hypertensive heart disease   . Lichen planus   . Melanoma ([place])   . Palpitations    a. rare PVC's and h/o SVT.  [name] PMR (polymyalgia rheumatica) (HCC)    h/o in setting of crestor usage.  [name] PSVT (paroxysmal supraventricular tachycardia) ([place])   . PUD (peptic ulcer disease)    remote history  . Raynaud's phenomenon   . Spinal stenosis   . Urine incontinence    H/O  . Vitamin D deficiency    Past Surgical History:  Procedure Laterality Date  . ADENOIDECTOMY     age 16  . BACK SURGERY     L3-L5  . BILATERAL CARPAL TUNNEL RELEASE    . BREAST BIOPSY Right    bx x 3 neg  . BREAST SURGERY Right    biopsy x 3 (all benign)  . CARDIAC CATHETERIZATION N/A 06/01/2016   Procedure: LEFT HEART CATH AND CORS/GRAFTS ANGIOGRAPHY;  Surgeon: Wellington Hampshire, MD;  Location:  [facility];  Service: Cardiovascular;  Laterality: N/A;  . CARDIAC CATHETERIZATION N/A 06/01/2016   Procedure: Coronary Stent Intervention;  Surgeon: Wellington Hampshire, MD;  Location: [facility];  Service: Cardiovascular;  Laterality: N/A;  . CARDIOVERSION N/A 03/08/2017   Procedure: CARDIOVERSION;  Surgeon: Wellington Hampshire, MD;  Location: [facility];  Service: Cardiovascular;  Laterality: N/A;  . CATARACT EXTRACTION W/PHACO Right 01/04/2016   Procedure: CATARACT EXTRACTION PHACO AND INTRAOCULAR LENS PLACEMENT ([place]);  Surgeon: Leandrew Koyanagi, MD;  Location: [facility];  Service: Ophthalmology;  Laterality: Right;  MALYUGIN  . CATARACT EXTRACTION W/PHACO Left 01/25/2016   Procedure: CATARACT EXTRACTION PHACO AND INTRAOCULAR LENS PLACEMENT ([place]) left eye;  Surgeon: Leandrew Koyanagi, MD;  Location: [facility];  Service: Ophthalmology;  Laterality: Left;  MALYUGIN SHUGARCAINE  . CHOLECYSTECTOMY  90's  . COLONOSCOPY  03/19/2020   Procedure: COLONOSCOPY;  Surgeon: Virgel Manifold, MD;  Location: [facility] ENDOSCOPY;  Service: Gastroenterology;;  . COLONOSCOPY WITH PROPOFOL N/A 05/03/2015   Procedure: COLONOSCOPY WITH PROPOFOL;  Surgeon: Lollie Sails, MD;  Location[facility] END[facility];  Service: Endoscopy;  Laterality: N/A;  . COLONOSCOPY WITH PROPOFOL N/A 03/19/2020   Procedure: COLONOSCOPY WITH PROPOFOL;  Surgeon: Virgel Manifold, MD;  Location: [facility] ENDOSCOPY;  Service: Endoscopy;  Laterality: N/A;  . CORONARY ARTERY BYPASS GRAFT  98   4 vessel  . CORONARY STENT INTERVENTION N/A 03/31/2018   Procedure: CORONARY STENT INTERVENTION;  Surgeon: Wellington Hampshire, MD;  Location: [facility];  Service: Cardiovascular;  Laterality: N/A;  . ESOPHAGOGASTRODUODENOSCOPY N/A 03/22/2015   Procedure: ESOPHAGOGASTRODUODENOSCOPY (EGD);  Surgeon: Lollie Sails, MD;  Location: [facility] ENDOSCOPY;  Service: Endoscopy;  Laterality: N/A;  . ESOPHAGOGASTRODUODENOSCOPY  N/A 03/19/2020   Procedure: ESOPHAGOGASTRODUODENOSCOPY (EGD);  Surgeon: Virgel Manifold, MD;  Location: [facility] ENDOSCOPY;  Service: Endoscopy;  Laterality: N/A;  . HEMORRHOID BANDING    . HEMORRHOID SURGERY N/A 02/17/2018   Procedure: HEMORRHOIDECTOMY;  Surgeon: Robert Bellow, MD;  Location: [facility];  Service: General;  Laterality: N/A;  . JOINT REPLACEMENT     BILATERAL KNEE REPLACEMENTS  . KNEE ARTHROSCOPY W/ OATS PROCEDURE     Lt knee (9/01), Rt knee (3/11), Lt hip (5/10)  . LEFT HEART CATH AND CORONARY ANGIOGRAPHY N/A 03/31/2018   Procedure: LEFT HEART CATH AND CORONARY ANGIOGRAPHY;  Surgeon: Wellington Hampshire, MD;  Location: [place] [facility];  Service: Cardiovascular;  Laterality: N/A;  . TOTAL HIP ARTHROPLASTY     Family History  Problem Relation Age of Onset  . Thyroid disease Mother        graves disease  . Heart disease Father        rheumatic heart  . Alcohol abuse Father   . Depression Father   . Lung cancer Sister   . Depression Daughter   . Thyroid disease Daughter        hashimoto  . Depression Son   . Stroke Maternal Grandmother   . Depression Maternal Grandmother   . Hypertension Maternal Grandfather   . Hypertension Paternal Grandfather   . Seizures Son   . Breast cancer Neg Hx    Social History   Socioeconomic History  . Marital status: Divorced    Spouse name: Not on file  . Number of children: 3  . Years of education: Not on file  . Highest education level: Not on file  Occupational History  . Occupation: Retired Sport and exercise psychologist  Tobacco Use  . Smoking status: Former Smoker    Types: Cigarettes  . Smokeless tobacco: Never Used  . Tobacco comment: quit 47+ yrs ago  Vaping Use  . Vaping Use: Never used  Substance and Sexual Activity  . Alcohol use: No    Alcohol/week: 0.0 standard drinks  . Drug use: No  . Sexual activity: Never  Other Topics Concern  . Not on file  Social History Narrative   Lives in [place].  Retired  Engineer, drilling.  Relatively active.   Uses a Rollator to ambulate   Social  Determinants of Health   Financial Resource Strain: Low Risk   . Difficulty of Paying Living Expenses: Not hard at all  Food Insecurity: No Food Insecurity  . Worried About Charity fundraiser in the Last Year: Never true  . Ran Out of Food in the Last Year: Never true  Transportation Needs: No Transportation Needs  . Lack of Transportation (Medical): No  . Lack of Transportation (Non-Medical): No  Physical Activity:   . Days of Exercise per Week: Not on file  . Minutes of Exercise per Session: Not on file  Stress: No Stress Concern Present  . Feeling of Stress : Not at all  Social Connections: Unknown  . Frequency of Communication with Friends and Family: More than three times a week  . Frequency of Social Gatherings with Friends and Family: Twice a week  . Attends Religious Services: Not on file  . Active Member of Clubs or Organizations: Not on file  . Attends Archivist Meetings: Not on file  . Marital Status: Not on file    Tobacco Counseling Counseling given: Not Answered Comment: quit 47+ yrs ago   Clinical Intake:  Pre-visit preparation completed: Yes        Diabetes: No  How often do you need to have someone help you when you read instructions, pamphlets, or other written materials from your doctor or pharmacy?: 1 - Never  Interpreter Needed?: No      Activities of Daily Living In your present state of health, do you have any difficulty performing the following activities: 05/26/2020 03/17/2020  Hearing? N N  Vision? N N  Difficulty concentrating or making decisions? N N  Walking or climbing stairs? Y Y  Comment Unsteady gait. Walker in use. -  Dressing or bathing? N Y  Doing errands, shopping? Y Y  Comment Patient does not drive. -  Preparing Food and eating ? Y -  Comment Staff prepares meals. Self feeds. -  Using the Toilet? N -  In the past six months, have you  accidently leaked urine? N -  Do you have problems with loss of bowel control? N -  Managing your Medications? Y -  Comment Staff assist -  Managing your Finances? Y -  Comment Managed by son, POA -  Housekeeping or managing your Housekeeping? Y -  Comment Maid assist -  Some recent data might be hidden    Patient Care Team: [name], MD as PCP - General (Internal Medicine) Minna Merritts, MD as PCP - Cardiology (Cardiology) Wende Bushy, MD as Consulting Physician (Cardiology) Clent Jacks, RN as Registered Nurse  Indicate any recent Medical Services you may have received from other than [facility] providers in the past year (date may be approximate).     Assessment:   This is a routine wellness examination for [place].  I connected with [place] today by telephone and verified that I am speaking with the correct person using two identifiers. Location patient: home Location provider: work Persons participating in the virtual visit: patient, Marine scientist.    I discussed the limitations, risks, security and privacy concerns of performing an evaluation and management service by telephone and the availability of in person appointments. The patient expressed understanding and verbally consented to this telephonic visit.    Interactive audio and video telecommunications were attempted between this provider and patient, however failed, due to patient having technical difficulties OR patient did not have access to video capability.  We continued and completed  visit with audio only.  Some vital signs may be absent or patient reported.   Hearing/Vision screen  Hearing Screening   125Hz  250Hz  500Hz  1000Hz  2000Hz  3000Hz  4000Hz  6000Hz  8000Hz   Right ear:           Left ear:           Comments: Patient is able to hear conversational tones without difficulty. No issues reported.  Vision Screening Comments: Wears corrective lenses Cataract extraction, bilateral Visual acuity not assessed,  virtual visit.  Dietary issues and exercise activities discussed: Current Exercise Habits: Home exercise routine, Type of exercise: walking, Intensity: Mild  Goals      Patient Stated   .  Monitor sodium intake (pt-stated)      Depression Screen PHQ 2/9 Scores 05/26/2020 05/26/2019 03/05/2018 03/11/2017 11/19/2016 10/19/2016 09/06/2016  PHQ - 2 Score 0 0 0 1 1 1  0  PHQ- 9 Score - - - 3 4 - -    Fall Risk Fall Risk  05/26/2020 05/26/2019 05/07/2019 03/05/2018 03/11/2017  Falls in the past year? 0 0 0 No No  Comment - - [place] Telephone Survey: data to providers prior to load - -  Number falls in past yr: 0 - - - -  Injury with Fall? - - - - -  Risk Factor Category  - - - - -  Risk for fall due to : - - - - -  Follow up Falls evaluation completed - - - -   Handrails in use when climbing stairs? Yes  Home free of loose throw rugs in walkways, pet beds, electrical cords, etc? Yes  Adequate lighting in your home to reduce risk of falls? Yes   ASSISTIVE DEVICES UTILIZED TO PREVENT FALLS:  Life alert? Yes  Use of a cane, walker or w/c? Yes , walker. Compression hose worn.  Grab bars in the bathroom? Yes  Shower chair or bench in shower? Yes  Elevated toilet seat or a handicapped toilet? Yes   TIMED UP AND GO:  Was the test performed? No . Virtual visit.   Cognitive Function: Patient is alert and oriented x3.  Denies difficulty with focusing, concentrating, making decisions.  Enjoys playing puzzles.  MMSE - Mini Mental State Exam 03/11/2017 02/23/2016  Orientation to time 5 5  Orientation to Place 5 5  Registration 3 3  Attention/ Calculation 5 5  Recall 3 3  Language- name 2 objects 2 2  Language- repeat 1 1  Language- follow 3 step command 3 3  Language- read & follow direction 1 1  Write a sentence 1 1  Copy design 1 1  Total score 30 30     6CIT Screen 05/26/2020 05/26/2019 03/18/2018  What Year? 0 points 0 points 0 points  What month? 0 points 0 points 0 points  What time? 0  points 0 points 0 points  Count back from 20 - 0 points 0 points  Months in reverse - 0 points 0 points  Repeat phrase - 0 points 0 points  Total Score - 0 0    Immunizations Immunization History  Administered Date(s) Administered  . Influenza, High Dose Seasonal PF 06/14/2016, 07/08/2018, 07/16/2019  . Influenza,inj,Quad PF,6+ Mos 06/15/2014  . Influenza-Unspecified 07/13/2015  . Moderna SARS-COVID-2 Vaccination 10/22/2019, 11/19/2019  . Pneumococcal Conjugate-13 01/13/2015  . Pneumococcal Polysaccharide-23 06/21/2009  . Tdap 02/22/2010  . Zoster 11/15/2010    TDAP status: Due, Education has been provided regarding the importance of this vaccine. Advised may receive this  vaccine at local pharmacy or Health Dept. Aware to provide a copy of the vaccination record if obtained from local pharmacy or Health Dept. Verbalized acceptance and understanding. Deferred per patient.    Health Maintenance Health Maintenance  Topic Date Due  . INFLUENZA VACCINE  08/26/2020 (Originally 05/08/2020)  . MAMMOGRAM  05/26/2021 (Originally 12/18/2019)  . DEXA SCAN  05/26/2021 (Originally 04/08/2003)  . TETANUS/TDAP  05/26/2021 (Originally 02/23/2020)  . COLONOSCOPY  03/19/2025  . COVID-19 Vaccine  Completed  . PNA vac Low Risk Adult  Completed   Mammogram- declined  Bone density- declined  Dental Screening: Recommended annual dental exams for proper oral hygiene. Visits every 6 months.   Community Resource Referral / Chronic Care Management: CRR required this visit?  No   CCM required this visit?  No      Plan:   Keep all routine maintenance appointments.   Follow up 07/04/20 @ 2:30  I have personally reviewed and noted the following in the patient's chart:   . Medical and social history . Use of alcohol, tobacco or illicit drugs  . Current medications and supplements . Functional ability and status . Nutritional status . Physical activity . Advanced directives . List of other  physicians . Hospitalizations, surgeries, and ER visits in previous 12 months . Vitals . Screenings to include cognitive, depression, and falls . Referrals and appointments  In addition, I have reviewed and discussed with patient certain preventive protocols, quality metrics, and best practice recommendations. A written personalized care plan for preventive services as well as general preventive health recommendations were provided to patient via mail.     Varney Biles, LPN   [id number]     Reviewed above information.  Agree with assessment and plan.   Dr Nicki Reaper

## 2020-05-30 DIAGNOSIS — L438 Other lichen planus: Secondary | ICD-10-CM | POA: Diagnosis not present

## 2020-05-30 DIAGNOSIS — L408 Other psoriasis: Secondary | ICD-10-CM | POA: Diagnosis not present

## 2020-06-04 NOTE — Progress Notes (Signed)
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 other    3 month follow up. Meds reviewed by the pt. verbally. Pt. c/o shortness of breath with little to no exertion.     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  coronary artery disease  prior bypass surgery 1998 stent to  graft 8/17  Echo EF 1/18 with EF 60-65%   moderate to severe left atrial enlargement cardioversion that did not hold,  reverted to atrial fibrillation Did not feel well on b-blocker, better on ca channel blocker Statin intolerance , on Repatha Who presents for routine follow-up of her chronic stable angina, atrial fibrillation   Ms. Vessels reports overall doing well Feels she is getting more weak, chronic back issues, legs are weaker Wears compression hose, swelling slightly worse on the left than the right Continues on the Lasix 40 twice daily Eating less secondary to sensitivity in her mouth, weight down 7 pounds  Continues to use a walker Some drop in her oxygenation with ambulation down to the low 90s Not on oxygen  Larynx paralyzed, difficulty talking  Prior echocardiogram again reviewed with her from 02/2020 Left ventricular ejection fraction, by estimation, is 60 to 65%. The  left ventricle has normal function. right ventricular size is moderately enlarged. severely elevated  pulmonary artery systolic pressure.  TR Peak grad:  61.8 mmHg  TR Vmax:    393.00 cm/s   Total cholesterol 120 LDL 48  EKG personally reviewed by myself on todays visit  atrial fib rate 75 PVCs poor R wave progression to the anterior  precordial leads  Other past medical history reviewed Cath 03/31/2018 Significant underlying three-vessel coronary artery disease with patent grafts including LIMA to LAD, SVG to RCA, SVG to OM and SVG to diagonal.  Patent stent in the SVG to RCA.  Severe new stenosis and SVG to diagonal is likely the culprit.  STENT RESOLUTE ONYX 4.0X15. to SVG to diag   hospital admission for acute on chronic diastolic and systolic CHF Cath 7/78/2423 . Significant underlying three-vessel coronary artery disease with patent grafts including LIMA to LAD, SVG to RCA, SVG to OM and SVG to diagonal. Patent stent in the SVG to RCA. Severe new stenosis and SVG to diagonal is likely the culprit. 2. Left ventricular angiography was not performed. 3. High normal left ventricular end-diastolic pressure at 12 mmHg. 4. Successful direct stenting of SVG to diagonal using a distal protection device.  ECHO Prior to cardiac catheterization and stent placement ejection fraction was in the range of 30% to 35%. Hypokinesis of the anteroseptalmyocardium, apical,  anterior myocardium.  - Mitral valve: There was mild regurgitation. - Left atrium: The atrium was mildly to moderately dilated. - Right ventricle: Systolic function was moderately to severely reduced. - Right atrium: The atrium was mildly dilated.  patent foramen ovale. - Tricuspid valve: moderate-severe regurgitation. - Pulmonary arteries: Systolic pressure was moderate to severely elevated PA peak pressure: 58 mm Hg (S).  long history of Statin intolerance, tried 4 different medications, had severe myalgias,  Lipitor,crestor, simvastatin  all had severe side effects  on Zetia, tolerating this Cholesterol still above goal, LDL 112 on Zetia alone  History of bleeding from hemorrhoids. Stable recently  Episode of amnesia 03/12/2017 Workup including EEG No further episodes since that time  Carotid ultrasound reviewed with her in detail Moderate  atherosclerotic plaque over the left carotid bulb    Prior CV studies:   The following studies were reviewed today:   Past Medical History:  Diagnosis Date  . Allergy   . Arthritis    s/p bilateral knees and left hip replacement  . Asthma   . CAD (coronary artery disease)    a. 12/1996 s/p CABG x4 ([place]);  b. 2005 Pt reports stress test & cath, which revealed patent grafts.  . Cancer ([place])    melanoma right arm  . Carotid arterial disease ([place])    a. 04/2015 Carotid U/S: <50% bilat ICA stenosis.  . Chronic kidney disease   . Colon polyps    H/O  . Depression   . GERD (gastroesophageal reflux disease)    h/o hiatal hernia  . Headache    migraines in past  . Heart murmur    a. 04/2011 Echo: EF 55-60%, bilat atrial enlargement, mild to mod TR.  [name] History of chicken pox   . History of hiatal hernia   . Hx of migraines    rare now  . Hx: UTI (urinary tract infection)   . Hyperlipidemia    a. Statin intolerant -->on zetia.  . Hypertension   . Hypertensive heart disease   . Lichen planus   . Melanoma ([place])   . Palpitations    a. rare PVC's and h/o SVT.  [name] PMR (polymyalgia rheumatica) ([facility])    h/o in setting of crestor usage.  [name] PSVT (paroxysmal supraventricular tachycardia) ([place])   . PUD (peptic ulcer disease)    remote history  . Raynaud's phenomenon   . Spinal stenosis   . Urine incontinence    H/O  . Vitamin D deficiency    Past Surgical History:  Procedure Laterality Date  . ADENOIDECTOMY     age 63  . BACK SURGERY     L3-L5  . BILATERAL CARPAL TUNNEL RELEASE    . BREAST BIOPSY Right    bx x 3 neg  . BREAST SURGERY Right    biopsy x 3 (all benign)  . CARDIAC CATHETERIZATION N/A 06/01/2016   Procedure: LEFT HEART CATH AND CORS/GRAFTS ANGIOGRAPHY;  Surgeon: Wellington Hampshire, MD;  Location: [place] [facility];  Service: Cardiovascular;  Laterality: N/A;  . CARDIAC CATHETERIZATION N/A 06/01/2016   Procedure: Coronary Stent Intervention;  Surgeon:  Wellington Hampshire, MD;  Location: [place] [facility];  Service: Cardiovascular;  Laterality: N/A;  . CARDIOVERSION N/A 03/08/2017   Procedure: CARDIOVERSION;  Surgeon: Wellington Hampshire, MD;  Location: [facility];  Service: Cardiovascular;  Laterality: N/A;  . CATARACT EXTRACTION W/PHACO Right 01/04/2016   Procedure: CATARACT EXTRACTION PHACO AND INTRAOCULAR LENS PLACEMENT ([place]);  Surgeon: Leandrew Koyanagi, MD;  Location: [facility];  Service: Ophthalmology;  Laterality: Right;  MALYUGIN  . CATARACT EXTRACTION W/PHACO Left 01/25/2016   Procedure: CATARACT EXTRACTION PHACO AND INTRAOCULAR LENS PLACEMENT ([place]) left eye;  Surgeon: Leandrew Koyanagi, MD;  Location: [facility];  Service: Ophthalmology;  Laterality: Left;  MALYUGIN SHUGARCAINE  . CHOLECYSTECTOMY  90's  . COLONOSCOPY  03/19/2020   Procedure: COLONOSCOPY;  Surgeon: Virgel Manifold, MD;  Location: [facility] END[facility]COPY;  Service: Gastroenterology;;  .  COLONOSCOPY WITH PROPOFOL N/A 05/03/2015   Procedure: COLONOSCOPY WITH PROPOFOL;  Surgeon: Lollie Sails, MD;  Location: [facility]COPY;  Service: Endoscopy;  Laterality: N/A;  . COLONOSCOPY WITH PROPOFOL N/A 03/19/2020   Procedure: COLONOSCOPY WITH PROPOFOL;  Surgeon: Virgel Manifold, MD;  Location: [facility] ENDOSCOPY;  Service: Endoscopy;  Laterality: N/A;  . CORONARY ARTERY BYPASS GRAFT  98   4 vessel  . CORONARY STENT INTERVENTION N/A 03/31/2018   Procedure: CORONARY STENT INTERVENTION;  Surgeon: Wellington Hampshire, MD;  Location: [place] [facility];  Service: Cardiovascular;  Laterality: N/A;  . ESOPHAGOGASTRODUODENOSCOPY N/A 03/22/2015   Procedure: ESOPHAGOGASTRODUODENOSCOPY (EGD);  Surgeon: Lollie Sails, MD;  Location: [facility] ENDOSCOPY;  Service: Endoscopy;  Laterality: N/A;  . ESOPHAGOGASTRODUODENOSCOPY N/A 03/19/2020   Procedure: ESOPHAGOGASTRODUODENOSCOPY (EGD);  Surgeon: Virgel Manifold, MD;  Location: [facility] ENDOSCOPY;  Service: Endoscopy;  Laterality: N/A;   . HEMORRHOID BANDING    . HEMORRHOID SURGERY N/A 02/17/2018   Procedure: HEMORRHOIDECTOMY;  Surgeon: Robert Bellow, MD;  Location: [facility] OR[facility];  Service: General;  Laterality: N/A;  . JOINT REPLACEMENT     BILATERAL KNEE REPLACEMENTS  . KNEE ARTHROSCOPY W/ OATS PROCEDURE     Lt knee (9/01), Rt knee (3/11), Lt hip (5/10)  . LEFT HEART CATH AND CORONARY ANGIOGRAPHY N/A 03/31/2018   Procedure: LEFT HEART CATH AND CORONARY ANGIOGRAPHY;  Surgeon: Wellington Hampshire, MD;  Location: [place] [facility];  Service: Cardiovascular;  Laterality: N/A;  . TOTAL HIP ARTHROPLASTY       Current Meds  Medication Sig  . acetaminophen (TYLENOL) 500 MG tablet Take 1,000 mg by mouth 2 (two) times daily.   [name] amitriptyline (ELAVIL) 25 MG tablet TAKE 1 TABLET BY MOUTH AT BEDTIME  . apixaban (ELIQUIS) 5 MG TABS tablet TAKE 1 TABLET(5 MG) BY MOUTH TWICE DAILY  . b complex vitamins tablet Take 1 tablet by mouth daily.  . Cholecalciferol (VITAMIN D3) 2000 UNITS TABS Take 2,000 Units by mouth 2 (two) times a week.   . clotrimazole (MYCELEX) 10 MG troche DISSLOVE 1 TABLET IN MOUTH 5 TIMES A DAYUNTIL FINISHED  . Coenzyme Q10 (COQ-10) 200 MG CAPS Take 200 mg by mouth daily.   [name] conjugated estrogens (PREMARIN) vaginal cream Apply 0.5mg  (pea-sized amount)  just inside the vaginal introitus with a finger-tip on  Monday, Wednesday and Friday nights. (Patient taking differently: as needed. Apply 0.5mg  (pea-sized amount)  just inside the vaginal introitus with a finger-tip on  Monday, Wednesday and Friday nights.)  . CRANBERRY PO Take 1 capsule by mouth daily.  [name] ezetimibe (ZETIA) 10 MG tablet TAKE 1 TABLET BY MOUTH DAILY  . furosemide (LASIX) 40 MG tablet Take 1 tablet (40 mg total) by mouth 2 (two) times daily.  . hydrocortisone 2.5 % cream Apply to VULVA at bedtime as needed with miconazole for lichen planus  . loratadine (CLARITIN) 10 MG tablet Take 10 mg by mouth daily.  . Multiple Vitamins-Minerals (HAIR/SKIN/NAILS PO)  Take 1 tablet by mouth daily.  . mupirocin ointment (BACTROBAN) 2 % Apply to affected area bid (Patient taking differently: Apply to affected area bid as needed)  . pantoprazole (PROTONIX) 40 MG tablet TAKE 1 TABLET(40 MG) BY MOUTH TWICE DAILY  . polyethylene glycol powder (GLYCOLAX/MIRALAX) 17 GM/SCOOP powder Take 8.5 g by mouth daily. Hold if diarrhea  . PREVIDENT 5000 BOOSTER PLUS 1.1 % PSTE USE TID AFTER MEALS . DO NOT RINSE  . ramipril (ALTACE) 5 MG capsule TAKE 1 CAPSULE(5 MG) BY  MOUTH DAILY  . REPATHA SURECLICK 588 MG/ML SOAJ INJECT 1 PEN AS DIRECTED EVERY 14 DAYS  . spironolactone (ALDACTONE) 25 MG tablet TAKE 1 TABLET(25 MG) BY MOUTH EVERY MORNING  . traMADol (ULTRAM) 50 MG tablet TAKE ONE (1) TABLET BY MOUTH TWO TIMES PER DAY AS NEEDED  . traZODone (DESYREL) 150 MG tablet Take 1 tablet to  1and 1/3 tablets q hs  . verapamil (CALAN-SR) 180 MG CR tablet TAKE 1 TABLET(180 MG) BY MOUTH TWICE DAILY  . vitamin B-12 (CYANOCOBALAMIN) 1000 MCG tablet Take 1,000 mcg by mouth daily.  . vitamin C (ASCORBIC ACID) 500 MG tablet Take 500 mg by mouth at bedtime.     Allergies:   Beta adrenergic blockers; Brilinta [ticagrelor]; Albuterol; Antihistamines, diphenhydramine-type; Aspirin; Nsaids; Penicillins; Statins; Sulfa antibiotics; Sulfasalazine; Tetracycline; and Tetracyclines & related   Social History   Tobacco Use  . Smoking status: Former Smoker    Types: Cigarettes  . Smokeless tobacco: Never Used  . Tobacco comment: quit 47+ yrs ago  Vaping Use  . Vaping Use: Never used  Substance Use Topics  . Alcohol use: No    Alcohol/week: 0.0 standard drinks  . Drug use: No       Family Hx: The patient's family history includes Alcohol abuse in her father; Depression in her daughter, father, maternal grandmother, and son; Heart disease in her father; Hypertension in her maternal grandfather and paternal grandfather; Lung cancer in her sister; Seizures in her son; Stroke in her maternal  grandmother; Thyroid disease in her daughter and mother. There is no history of Breast cancer.  ROS:   Please see the history of present illness.    Review of Systems  Constitutional: Negative.   HENT: Negative.   Respiratory: Positive for shortness of breath.   Cardiovascular: Negative.   Gastrointestinal: Negative.   Musculoskeletal: Negative.        Gait instability  Neurological: Negative.   Psychiatric/Behavioral: Negative.   All other systems reviewed and are negative.    Labs/Other Tests and Data Reviewed:    Recent Labs: 02/03/2020: TSH 2.004 03/18/2020: Magnesium 2.0 03/24/2020: ALT 20; BUN 19; Creatinine 1.0; Hemoglobin 11.7; Platelets 268; Potassium 4.0; Sodium 134   Recent Lipid Panel Lab Results  Component Value Date/Time   CHOL 120 02/03/2020 09:03 AM   TRIG 86 02/03/2020 09:03 AM   HDL 55 02/03/2020 09:03 AM   CHOLHDL 2.2 02/03/2020 09:03 AM   LDLCALC 48 02/03/2020 09:03 AM   LDLDIRECT 126.7 07/03/2013 09:37 AM    Wt Readings from Last 3 Encounters:  06/06/20 179 lb 2 oz (81.3 kg)  05/26/20 181 lb (82.1 kg)  04/01/20 181 lb 6.4 oz (82.3 kg)     Exam:    Vital Signs: Vital signs may also be detailed in the HPI BP 110/60 (BP Location: Left Arm, Patient Position: Sitting, Cuff Size: Normal)   Pulse 75   Ht 5\' 3"  (1.6 m)   Wt 179 lb 2 oz (81.3 kg)   SpO2 97%   BMI 31.73 kg/m    Constitutional:  oriented to person, place, and time. No distress.  Walking with a walker HENT:  Head: Grossly normal Eyes:  no discharge. No scleral icterus.  Neck: No JVD, no carotid bruits  Cardiovascular: Regular rate and rhythm, no murmurs appreciated Pulmonary/Chest: Clear to auscultation bilaterally, no wheezes or rails Abdominal: Soft.  no distension.  no tenderness.  Musculoskeletal: Normal range of motion, kyphosis Neurological:  normal muscle tone. Coordination normal. No atrophy Skin:  Skin warm and dry Psychiatric: normal affect, pleasant  ASSESSMENT & PLAN:     Persistent atrial fibrillation Tolerating anticoagulation, rate relatively well controlled No changes to her medications  Pulmonary hypertension Discussed prior echocardiogram findings, Continue lower extremity edema Discussed various treatment options for her diuretics She would prefer to continue Lasix up to 40 twice daily  With spironolactone Weight is down 7 pounds We did discuss metolazone for any worsening shortness of breath, even a change to torsemide if needed.  She prefers to keep it as it is for now  Chronic diastolic heart failure ([place]) Plan as above, Lasix 40 twice daily She reports that she will have lab work with primary care next month  Coronary artery disease of native artery of native heart with stable angina pectoris ([place]) Currently with no symptoms of angina. No further workup at this time. Continue current medication regimen.  Hypercholesterolemia Cholesterol is at goal on the current lipid regimen. No changes to the medications were made.  Essential hypertension, benign Blood pressure is well controlled on today's visit. No changes made to the medications.  Dyspnea, unspecified type Combination of pulmonary hypertension, kyphosis, deconditioning, atrial fibrillation Unable to exercise secondary to limitations  TGA (transient global amnesia) Denies any further episodes, cognition stable   Disposition: Follow-up in 6 months   Signed, Ida Rogue, MD  06/06/2020 3:00 PM    [place] Office [address] [place] [address], [place], [place] [address]

## 2020-06-06 ENCOUNTER — Other Ambulatory Visit: Payer: Self-pay

## 2020-06-06 ENCOUNTER — Ambulatory Visit (INDEPENDENT_AMBULATORY_CARE_PROVIDER_SITE_OTHER): Payer: Medicare Other | Admitting: Cardiovascular Disease

## 2020-06-06 ENCOUNTER — Encounter: Payer: Self-pay | Admitting: Cardiovascular Disease

## 2020-06-06 VITALS — BP 110/60 | HR 75 | Ht 63.0 in | Wt 179.1 lb

## 2020-06-06 DIAGNOSIS — I2510 Atherosclerotic heart disease of native coronary artery without angina pectoris: Secondary | ICD-10-CM

## 2020-06-06 DIAGNOSIS — I10 Essential (primary) hypertension: Secondary | ICD-10-CM

## 2020-06-06 DIAGNOSIS — I4819 Other persistent atrial fibrillation: Secondary | ICD-10-CM | POA: Diagnosis not present

## 2020-06-06 DIAGNOSIS — I25118 Atherosclerotic heart disease of native coronary artery with other forms of angina pectoris: Secondary | ICD-10-CM

## 2020-06-06 DIAGNOSIS — E785 Hyperlipidemia, unspecified: Secondary | ICD-10-CM | POA: Diagnosis not present

## 2020-06-06 DIAGNOSIS — I5042 Chronic combined systolic (congestive) and diastolic (congestive) heart failure: Secondary | ICD-10-CM

## 2020-06-06 DIAGNOSIS — I119 Hypertensive heart disease without heart failure: Secondary | ICD-10-CM

## 2020-06-06 NOTE — Patient Instructions (Signed)
Medication Instructions:  No changes  If you need a refill on your cardiac medications before your next appointment, please call your pharmacy.    Lab work: No new labs needed   If you have labs (blood work) drawn today and your tests are completely normal, you will receive your results only by: . MyChart Message (if you have MyChart) OR . A paper copy in the mail If you have any lab test that is abnormal or we need to change your treatment, we will call you to review the results.   Testing/Procedures: No new testing needed   Follow-Up: At [facility], you and your health needs are our priority.  As part of our continuing mission to provide you with exceptional heart care, we have created designated Provider Care Teams.  These Care Teams include your primary Cardiologist (physician) and Advanced Practice Providers (APPs -  Physician Assistants and Nurse Practitioners) who all work together to provide you with the care you need, when you need it.  . You will need a follow up appointment in 6 months  . Providers on your designated Care Team:   . Christopher Berge, NP . Ryan Dunn, PA-C . Jacquelyn Visser, PA-C  Any Other Special Instructions Will Be Listed Below (If Applicable).  COVID-19 Vaccine Information can be found at: [email or web address] For questions related to vaccine distribution or appointments, please email [email or web address] or call [phone].     

## 2020-06-27 ENCOUNTER — Other Ambulatory Visit: Payer: Self-pay | Admitting: Internal Medicine

## 2020-06-28 NOTE — Telephone Encounter (Signed)
rx ok'd tramadol #30 with 1 refill.

## 2020-07-04 ENCOUNTER — Ambulatory Visit (INDEPENDENT_AMBULATORY_CARE_PROVIDER_SITE_OTHER): Payer: Medicare Other | Admitting: Internal Medicine

## 2020-07-04 ENCOUNTER — Other Ambulatory Visit: Payer: Self-pay

## 2020-07-04 VITALS — BP 118/70 | HR 68 | Temp 98.2°F | Resp 16 | Ht 63.0 in | Wt 180.0 lb

## 2020-07-04 DIAGNOSIS — J4520 Mild intermittent asthma, uncomplicated: Secondary | ICD-10-CM

## 2020-07-04 DIAGNOSIS — E7800 Pure hypercholesterolemia, unspecified: Secondary | ICD-10-CM

## 2020-07-04 DIAGNOSIS — E782 Mixed hyperlipidemia: Secondary | ICD-10-CM

## 2020-07-04 DIAGNOSIS — D649 Anemia, unspecified: Secondary | ICD-10-CM

## 2020-07-04 DIAGNOSIS — R739 Hyperglycemia, unspecified: Secondary | ICD-10-CM

## 2020-07-04 DIAGNOSIS — I25118 Atherosclerotic heart disease of native coronary artery with other forms of angina pectoris: Secondary | ICD-10-CM | POA: Diagnosis not present

## 2020-07-04 DIAGNOSIS — I10 Essential (primary) hypertension: Secondary | ICD-10-CM | POA: Diagnosis not present

## 2020-07-04 DIAGNOSIS — M4800 Spinal stenosis, site unspecified: Secondary | ICD-10-CM

## 2020-07-04 DIAGNOSIS — K2100 Gastro-esophageal reflux disease with esophagitis, without bleeding: Secondary | ICD-10-CM | POA: Diagnosis not present

## 2020-07-04 DIAGNOSIS — E559 Vitamin D deficiency, unspecified: Secondary | ICD-10-CM | POA: Diagnosis not present

## 2020-07-04 DIAGNOSIS — I5032 Chronic diastolic (congestive) heart failure: Secondary | ICD-10-CM

## 2020-07-04 DIAGNOSIS — L439 Lichen planus, unspecified: Secondary | ICD-10-CM | POA: Diagnosis not present

## 2020-07-04 DIAGNOSIS — I2510 Atherosclerotic heart disease of native coronary artery without angina pectoris: Secondary | ICD-10-CM

## 2020-07-04 DIAGNOSIS — Z86010 Personal history of colonic polyps: Secondary | ICD-10-CM

## 2020-07-04 DIAGNOSIS — I4819 Other persistent atrial fibrillation: Secondary | ICD-10-CM

## 2020-07-04 NOTE — Progress Notes (Signed)
Patient ID: Charlotte Sessions, MD, female   DOB: November 07, 1935, 85 y.o.   MRN: [id number]   Subjective:    Patient ID: Charlotte Sessions, MD, female    DOB: [birth year], 84 y.o.   MRN: [id number]  HPI This visit occurred during the SARS-CoV-2 public health emergency.  Safety protocols were in place, including screening questions prior to the visit, additional usage of staff PPE, and extensive cleaning of exam room while observing appropriate contact time as indicated for disinfecting solutions.  Patient here for a scheduled follow up. Has a history of pulmonary hypertension and afib.  Sees Dr Rockey Situ.  Last evaluated 06/06/20.  On lasix and aldactone.  Some lower extremity swelling.  Discussed compression hose.  Breathing stable. On eliquis.  No changes made.  Recommended f/u in 6 months.  Sees GI.  Last evaluated 03/31/20.  History of rectal prolapse.  Overall stable.  No bleeding.  Chronic constipation.  Is in assisted living now. Using her walker to ambulate.  Has lichen planus and yeast - recently affecting her mouth.  Treated.  Blood pressure ok.    Past Medical History:  Diagnosis Date  . Allergy   . Arthritis    s/p bilateral knees and left hip replacement  . Asthma   . CAD (coronary artery disease)    a. 12/1996 s/p CABG x4 ([place]);  b. 2005 Pt reports stress test & cath, which revealed patent grafts.  . Cancer ([place])    melanoma right arm  . Carotid arterial disease ([place])    a. 04/2015 Carotid U/S: <50% bilat ICA stenosis.  . Chronic kidney disease   . Colon polyps    H/O  . Depression   . GERD (gastroesophageal reflux disease)    h/o hiatal hernia  . Headache    migraines in past  . Heart murmur    a. 04/2011 Echo: EF 55-60%, bilat atrial enlargement, mild to mod TR.  [name] History of chicken pox   . History of hiatal hernia   . Hx of migraines    rare now  . Hx: UTI (urinary tract infection)   . Hyperlipidemia    a. Statin intolerant -->on zetia.  . Hypertension   . Hypertensive  heart disease   . Lichen planus   . Melanoma ([place])   . Palpitations    a. rare PVC's and h/o SVT.  [name] PMR (polymyalgia rheumatica) (HCC)    h/o in setting of crestor usage.  [name] PSVT (paroxysmal supraventricular tachycardia) ([place])   . PUD (peptic ulcer disease)    remote history  . Raynaud's phenomenon   . Spinal stenosis   . Urine incontinence    H/O  . Vitamin D deficiency    Past Surgical History:  Procedure Laterality Date  . ADENOIDECTOMY     age 61  . BACK SURGERY     L3-L5  . BILATERAL CARPAL TUNNEL RELEASE    . BREAST BIOPSY Right    bx x 3 neg  . BREAST SURGERY Right    biopsy x 3 (all benign)  . CARDIAC CATHETERIZATION N/A 06/01/2016   Procedure: LEFT HEART CATH AND CORS/GRAFTS ANGIOGRAPHY;  Surgeon: Wellington Hampshire, MD;  Location: [place] [facility];  Service: Cardiovascular;  Laterality: N/A;  . CARDIAC CATHETERIZATION N/A 06/01/2016   Procedure: Coronary Stent Intervention;  Surgeon: Wellington Hampshire, MD;  Location: [place] [facility];  Service: Cardiovascular;  Laterality: N/A;  . CARDIOVERSION N/A 03/08/2017   Procedure: CARDIOVERSION;  Surgeon:  Wellington Hampshire, MD;  Location: [facility];  Service: Cardiovascular;  Laterality: N/A;  . CATARACT EXTRACTION W/PHACO Right 01/04/2016   Procedure: CATARACT EXTRACTION PHACO AND INTRAOCULAR LENS PLACEMENT ([place]);  Surgeon: Leandrew Koyanagi, MD;  Location: [place];  Service: Ophthalmology;  Laterality: Right;  MALYUGIN  . CATARACT EXTRACTION W/PHACO Left 01/25/2016   Procedure: CATARACT EXTRACTION PHACO AND INTRAOCULAR LENS PLACEMENT ([place]) left eye;  Surgeon: Leandrew Koyanagi, MD;  Location: [place];  Service: Ophthalmology;  Laterality: Left;  MALYUGIN SHUGARCAINE  . CHOLECYSTECTOMY  90's  . COLONOSCOPY  03/19/2020   Procedure: COLONOSCOPY;  Surgeon: Virgel Manifold, MD;  Location: [facility] END[facility];  Service: Gastroenterology;;  . COLONOSCOPY WITH PROPOFOL N/A 05/03/2015   Procedure:  COLONOSCOPY WITH PROPOFOL;  Surgeon: Lollie Sails, MD;  Location: [facility] ENDOSCOPY;  Service: Endoscopy;  Laterality: N/A;  . COLONOSCOPY WITH PROPOFOL N/A 03/19/2020   Procedure: COLONOSCOPY WITH PROPOFOL;  Surgeon: Virgel Manifold, MD;  Location: [facility] ENDOSCOPY;  Service: Endoscopy;  Laterality: N/A;  . CORONARY ARTERY BYPASS GRAFT  98   4 vessel  . CORONARY STENT INTERVENTION N/A 03/31/2018   Procedure: CORONARY STENT INTERVENTION;  Surgeon: Wellington Hampshire, MD;  Location: [place] [facility];  Service: Cardiovascular;  Laterality: N/A;  . ESOPHAGOGASTRODUODENOSCOPY N/A 03/22/2015   Procedure: ESOPHAGOGASTRODUODENOSCOPY (EGD);  Surgeon: Lollie Sails, MD;  Location: [facility] ENDOSCOPY;  Service: Endoscopy;  Laterality: N/A;  . ESOPHAGOGASTRODUODENOSCOPY N/A 03/19/2020   Procedure: ESOPHAGOGASTRODUODENOSCOPY (EGD);  Surgeon: Virgel Manifold, MD;  Location: [facility] ENDOSCOPY;  Service: Endoscopy;  Laterality: N/A;  . HEMORRHOID BANDING    . HEMORRHOID SURGERY N/A 02/17/2018   Procedure: HEMORRHOIDECTOMY;  Surgeon: Robert Bellow, MD;  Location: [facility];  Service: General;  Laterality: N/A;  . JOINT REPLACEMENT     BILATERAL KNEE REPLACEMENTS  . KNEE ARTHROSCOPY W/ OATS PROCEDURE     Lt knee (9/01), Rt knee (3/11), Lt hip (5/10)  . LEFT HEART CATH AND CORONARY ANGIOGRAPHY N/A 03/31/2018   Procedure: LEFT HEART CATH AND CORONARY ANGIOGRAPHY;  Surgeon: Wellington Hampshire, MD;  Location: [place] [facility];  Service: Cardiovascular;  Laterality: N/A;  . TOTAL HIP ARTHROPLASTY     Family History  Problem Relation Age of Onset  . Thyroid disease Mother        graves disease  . Heart disease Father        rheumatic heart  . Alcohol abuse Father   . Depression Father   . Lung cancer Sister   . Depression Daughter   . Thyroid disease Daughter        hashimoto  . Depression Son   . Stroke Maternal Grandmother   . Depression Maternal Grandmother   . Hypertension Maternal  Grandfather   . Hypertension Paternal Grandfather   . Seizures Son   . Breast cancer Neg Hx    Social History   Socioeconomic History  . Marital status: Divorced    Spouse name: Not on file  . Number of children: 3  . Years of education: Not on file  . Highest education level: Not on file  Occupational History  . Occupation: Retired Sport and exercise psychologist  Tobacco Use  . Smoking status: Former Smoker    Types: Cigarettes  . Smokeless tobacco: Never Used  . Tobacco comment: quit 47+ yrs ago  Vaping Use  . Vaping Use: Never used  Substance and Sexual Activity  . Alcohol use: No    Alcohol/week: 0.0 standard drinks  .  Drug use: No  . Sexual activity: Never  Other Topics Concern  . Not on file  Social History Narrative   Lives in [place].  Retired Engineer, drilling.  Relatively active.   Uses a Rollator to ambulate   Social Determinants of Health   Financial Resource Strain: Low Risk   . Difficulty of Paying Living Expenses: Not hard at all  Food Insecurity: No Food Insecurity  . Worried About Charity fundraiser in the Last Year: Never true  . Ran Out of Food in the Last Year: Never true  Transportation Needs: No Transportation Needs  . Lack of Transportation (Medical): No  . Lack of Transportation (Non-Medical): No  Physical Activity:   . Days of Exercise per Week: Not on file  . Minutes of Exercise per Session: Not on file  Stress: No Stress Concern Present  . Feeling of Stress : Not at all  Social Connections: Unknown  . Frequency of Communication with Friends and Family: More than three times a week  . Frequency of Social Gatherings with Friends and Family: Twice a week  . Attends Religious Services: Not on file  . Active Member of Clubs or Organizations: Not on file  . Attends Archivist Meetings: Not on file  . Marital Status: Not on file    Outpatient Encounter Medications as of 07/04/2020  Medication Sig  . acetaminophen (TYLENOL) 500 MG tablet Take 1,000  mg by mouth 2 (two) times daily.   [name] amitriptyline (ELAVIL) 25 MG tablet TAKE 1 TABLET BY MOUTH AT BEDTIME  . apixaban (ELIQUIS) 5 MG TABS tablet TAKE 1 TABLET(5 MG) BY MOUTH TWICE DAILY  . b complex vitamins tablet Take 1 tablet by mouth daily.  . Cholecalciferol (VITAMIN D3) 2000 UNITS TABS Take 2,000 Units by mouth 2 (two) times a week.   . clotrimazole (MYCELEX) 10 MG troche DISSLOVE 1 TABLET IN MOUTH 5 TIMES A DAYUNTIL FINISHED  . Coenzyme Q10 (COQ-10) 200 MG CAPS Take 200 mg by mouth daily.   [name] conjugated estrogens (PREMARIN) vaginal cream Apply 0.$RemoveBefor'5mg'ZlGLFGTvUvde$  (pea-sized amount)  just inside the vaginal introitus with a finger-tip on  Monday, Wednesday and Friday nights. (Patient taking differently: as needed. Apply 0.$RemoveBeforeD'5mg'IyPbAZSEOQlZok$  (pea-sized amount)  just inside the vaginal introitus with a finger-tip on  Monday, Wednesday and Friday nights.)  . CRANBERRY PO Take 1 capsule by mouth daily.  [name] ezetimibe (ZETIA) 10 MG tablet TAKE 1 TABLET BY MOUTH DAILY  . furosemide (LASIX) 40 MG tablet Take 1 tablet (40 mg total) by mouth 2 (two) times daily.  . hydrocortisone 2.5 % cream Apply to VULVA at bedtime as needed with miconazole for lichen planus  . loratadine (CLARITIN) 10 MG tablet Take 10 mg by mouth daily.  . Multiple Vitamins-Minerals (HAIR/SKIN/NAILS PO) Take 1 tablet by mouth daily.  . mupirocin ointment (BACTROBAN) 2 % Apply to affected area bid (Patient taking differently: Apply to affected area bid as needed)  . pantoprazole (PROTONIX) 40 MG tablet TAKE 1 TABLET(40 MG) BY MOUTH TWICE DAILY  . polyethylene glycol powder (GLYCOLAX/MIRALAX) 17 GM/SCOOP powder Take 8.5 g by mouth daily. Hold if diarrhea  . PREVIDENT 5000 BOOSTER PLUS 1.1 % PSTE USE TID AFTER MEALS . DO NOT RINSE  . ramipril (ALTACE) 5 MG capsule TAKE 1 CAPSULE(5 MG) BY MOUTH DAILY  . REPATHA SURECLICK 423 MG/ML SOAJ INJECT 1 PEN AS DIRECTED EVERY 14 DAYS  . traMADol (ULTRAM) 50 MG tablet TAKE ONE (1) TABLET BY MOUTH TWO TIMES  PER DAY AS  NEEDED  . traZODone (DESYREL) 150 MG tablet Take 1 tablet to  1and 1/3 tablets q hs  . triamcinolone cream (KENALOG) 0.1 % Apply topically 2 (two) times daily.  . verapamil (CALAN-SR) 180 MG CR tablet TAKE 1 TABLET(180 MG) BY MOUTH TWICE DAILY  . vitamin B-12 (CYANOCOBALAMIN) 1000 MCG tablet Take 1,000 mcg by mouth daily.  . vitamin C (ASCORBIC ACID) 500 MG tablet Take 500 mg by mouth at bedtime.  . [DISCONTINUED] spironolactone (ALDACTONE) 25 MG tablet TAKE 1 TABLET(25 MG) BY MOUTH EVERY MORNING   No facility-administered encounter medications on file as of 07/04/2020.    Review of Systems  Constitutional: Positive for fatigue. Negative for appetite change and unexpected weight change.  HENT: Negative for congestion and sinus pressure.   Respiratory: Negative for cough, chest tightness and shortness of breath.   Cardiovascular: Negative for chest pain and palpitations.  Gastrointestinal: Negative for abdominal pain, nausea and vomiting.  Genitourinary: Negative for difficulty urinating and dysuria.  Musculoskeletal: Positive for back pain. Negative for joint swelling and myalgias.  Skin: Negative for color change and rash.  Neurological: Negative for dizziness, light-headedness and headaches.  Psychiatric/Behavioral: Negative for agitation and dysphoric mood.       Objective:    Physical Exam Vitals reviewed.  Constitutional:      General: She is not in acute distress.    Appearance: Normal appearance.  HENT:     Head: Normocephalic and atraumatic.     Right Ear: External ear normal.     Left Ear: External ear normal.  Eyes:     General: No scleral icterus.       Right eye: No discharge.        Left eye: No discharge.     Conjunctiva/sclera: Conjunctivae normal.  Neck:     Thyroid: No thyromegaly.  Cardiovascular:     Rate and Rhythm: Normal rate.     Comments: Rate controlled.  Pulmonary:     Effort: No respiratory distress.     Breath sounds: Normal breath sounds. No  wheezing.  Abdominal:     General: Bowel sounds are normal.     Palpations: Abdomen is soft.     Tenderness: There is no abdominal tenderness.  Musculoskeletal:     Cervical back: Neck supple. No tenderness.     Comments: Pedal/lower extremity swelling - no increase.    Lymphadenopathy:     Cervical: No cervical adenopathy.  Skin:    Findings: No erythema or rash.  Neurological:     Mental Status: She is alert.  Psychiatric:        Mood and Affect: Mood normal.        Behavior: Behavior normal.     BP 118/70   Pulse 68   Temp 98.2 F (36.8 C) (Oral)   Resp 16   Ht $R'5\' 3"'LK$  (1.6 m)   Wt 180 lb (81.6 kg)   SpO2 98%   BMI 31.89 kg/m  Wt Readings from Last 3 Encounters:  07/04/20 180 lb (81.6 kg)  06/06/20 179 lb 2 oz (81.3 kg)  05/26/20 181 lb (82.1 kg)     Lab Results  Component Value Date   WBC 5.5 07/04/2020   HGB 11.5 (L) 07/04/2020   HCT 34.5 (L) 07/04/2020   PLT 263.0 07/04/2020   GLUCOSE 98 07/04/2020   CHOL 120 02/03/2020   TRIG 86 02/03/2020   HDL 55 02/03/2020   LDLDIRECT 126.7 07/03/2013   LDLCALC 48  02/03/2020   ALT 17 07/04/2020   AST 20 07/04/2020   NA 133 (L) 07/04/2020   K 4.1 07/04/2020   CL 95 (L) 07/04/2020   CREATININE 1.09 07/04/2020   BUN 28 (H) 07/04/2020   CO2 30 07/04/2020   TSH 2.004 02/03/2020   INR 1.34 03/28/2018   HGBA1C 5.7 (H) 02/03/2020       Assessment & Plan:   Problem List Items Addressed This Visit    Vitamin D deficiency    Follow vitamin D level.       Spinal stenosis    Chronic back and leg pain.  Using her walker to ambulate.        Lichen planus    Persistent issues.  F/u with dermatology.       Hyperlipidemia    Intolerant to statin medication.  On zetia and repatha.  Follow lipid panel.  Low cholesterol diet and exercise.        Hyperglycemia    Low carb diet and exercise.  Follow met b and a1c.       Hypercholesterolemia - Primary   History of colonic polyps    Had colonoscopy 03/19/20.  No  further bleeding.  Follow.       GERD (gastroesophageal reflux disease)    No upper symptoms reported.  On protonix.       Essential hypertension, benign    Blood pressure doing well.  Continue verapamil, aldactone, lasix and altace.  Follow pressures.  Follow metabolic panel.       Relevant Orders   Comprehensive metabolic panel (Completed)   Chronic diastolic heart failure (HCC)    Breathing stable.  On altace, lasix and aldactone.  Appears to be stable.  Follow.        CAD (coronary artery disease)    Followed by cardiology.  On eliquis.  Just evaluated 06/06/20 .  Stable.  No changes made.       Atrial fibrillation ([place])    Followed by cardiology.  On eliquis.  Just evaluated 06/06/20 .  Stable.  No changes made.        Asthma    Breathing over stable.  No acute sob currently.       Anemia    Follow cbc.       Relevant Orders   CBC with Differential/Platelet (Completed)   IBC + Ferritin (Completed)       [name], MD

## 2020-07-05 LAB — IBC + FERRITIN
Ferritin: 20.3 ng/mL (ref 10.0–291.0)
Iron: 35 ug/dL — ABNORMAL LOW (ref 42–145)
Saturation Ratios: 7.5 % — ABNORMAL LOW (ref 20.0–50.0)
Transferrin: 335 mg/dL (ref 212.0–360.0)

## 2020-07-05 LAB — CBC WITH DIFFERENTIAL/PLATELET
Basophils Absolute: 0 10*3/uL (ref 0.0–0.1)
Basophils Relative: 0.4 % (ref 0.0–3.0)
Eosinophils Absolute: 0.1 10*3/uL (ref 0.0–0.7)
Eosinophils Relative: 0.9 % (ref 0.0–5.0)
HCT: 34.5 % — ABNORMAL LOW (ref 36.0–46.0)
Hemoglobin: 11.5 g/dL — ABNORMAL LOW (ref 12.0–15.0)
Lymphocytes Relative: 15.2 % (ref 12.0–46.0)
Lymphs Abs: 0.8 10*3/uL (ref 0.7–4.0)
MCHC: 33.2 g/dL (ref 30.0–36.0)
MCV: 93.6 fl (ref 78.0–100.0)
Monocytes Absolute: 0.8 10*3/uL (ref 0.1–1.0)
Monocytes Relative: 14.3 % — ABNORMAL HIGH (ref 3.0–12.0)
Neutro Abs: 3.8 10*3/uL (ref 1.4–7.7)
Neutrophils Relative %: 69.2 % (ref 43.0–77.0)
Platelets: 263 10*3/uL (ref 150.0–400.0)
RBC: 3.68 Mil/uL — ABNORMAL LOW (ref 3.87–5.11)
RDW: 14.3 % (ref 11.5–15.5)
WBC: 5.5 10*3/uL (ref 4.0–10.5)

## 2020-07-05 LAB — COMPREHENSIVE METABOLIC PANEL
ALT: 17 U/L (ref 0–35)
AST: 20 U/L (ref 0–37)
Albumin: 4.4 g/dL (ref 3.5–5.2)
Alkaline Phosphatase: 61 U/L (ref 39–117)
BUN: 28 mg/dL — ABNORMAL HIGH (ref 6–23)
CO2: 30 mEq/L (ref 19–32)
Calcium: 9.7 mg/dL (ref 8.4–10.5)
Chloride: 95 mEq/L — ABNORMAL LOW (ref 96–112)
Creatinine, Ser: 1.09 mg/dL (ref 0.40–1.20)
GFR: 48.03 mL/min — ABNORMAL LOW (ref >60.00)
Glucose, Bld: 98 mg/dL (ref 70–99)
Potassium: 4.1 mEq/L (ref 3.5–5.1)
Sodium: 133 mEq/L — ABNORMAL LOW (ref 135–145)
Total Bilirubin: 0.5 mg/dL (ref 0.2–1.2)
Total Protein: 6.6 g/dL (ref 6.0–8.3)

## 2020-07-06 ENCOUNTER — Other Ambulatory Visit: Payer: Self-pay | Admitting: Cardiovascular Disease

## 2020-07-17 ENCOUNTER — Encounter: Payer: Self-pay | Admitting: Internal Medicine

## 2020-07-17 NOTE — Assessment & Plan Note (Signed)
Chronic back and leg pain.  Using her walker to ambulate.

## 2020-07-17 NOTE — Assessment & Plan Note (Signed)
Breathing stable.  On altace, lasix and aldactone.  Appears to be stable.  Follow.   

## 2020-07-17 NOTE — Assessment & Plan Note (Signed)
Breathing over stable.  No acute sob currently.

## 2020-07-17 NOTE — Assessment & Plan Note (Signed)
Follow cbc.  

## 2020-07-17 NOTE — Assessment & Plan Note (Signed)
Blood pressure doing well.  Continue verapamil, aldactone, lasix and altace.  Follow pressures.  Follow metabolic panel.

## 2020-07-17 NOTE — Assessment & Plan Note (Signed)
Intolerant to statin medication.  On zetia and repatha.  Follow lipid panel.  Low cholesterol diet and exercise.   

## 2020-07-17 NOTE — Assessment & Plan Note (Signed)
Low carb diet and exercise.  Follow met b and a1c.  

## 2020-07-17 NOTE — Assessment & Plan Note (Signed)
Persistent issues.  F/u with dermatology.

## 2020-07-17 NOTE — Assessment & Plan Note (Signed)
Had colonoscopy 03/19/20.  No further bleeding.  Follow.

## 2020-07-17 NOTE — Assessment & Plan Note (Signed)
No upper symptoms reported.  On protonix.   

## 2020-07-17 NOTE — Assessment & Plan Note (Signed)
Followed by cardiology.  On eliquis.  Just evaluated 06/06/20 .  Stable.  No changes made.

## 2020-07-17 NOTE — Assessment & Plan Note (Signed)
Follow vitamin D level.  

## 2020-07-18 ENCOUNTER — Telehealth: Payer: Self-pay

## 2020-07-18 NOTE — Telephone Encounter (Signed)
Pt said she has not received her lab results in the mail yet. She would like for you to send her a copy in the mail.

## 2020-07-18 NOTE — Telephone Encounter (Signed)
Labs mailed to patient.

## 2020-07-21 ENCOUNTER — Telehealth: Payer: Self-pay | Admitting: Cardiovascular Disease

## 2020-07-21 NOTE — Telephone Encounter (Signed)
Patient wanted to make Dr. Rockey Situ aware of the lab work she did back on 7/27 with Dr. Lars Mage office. Patient is concerned about BUN level that went from 19 to 28. Patient is concerned something should be done with her lasix as well  Please advise

## 2020-07-24 NOTE — Telephone Encounter (Signed)
I am actually not worried about slightly elevated bun, With her pulmonary HTN, I find folks get slightly better breathing when running a little bit "dry" If she is concerned about the BUN and corned about over-diuresis, she could cut back on PM lasix two days a week. Up to her, but I am ok with it either way

## 2020-07-25 NOTE — Telephone Encounter (Signed)
Spoke with patient and reviewed providers comments. She was agreeable and would just continue on. She had no further questions at this time.

## 2020-07-26 DIAGNOSIS — Z23 Encounter for immunization: Secondary | ICD-10-CM | POA: Diagnosis not present

## 2020-08-02 DIAGNOSIS — L89322 Pressure ulcer of left buttock, stage 2: Secondary | ICD-10-CM | POA: Diagnosis not present

## 2020-08-02 DIAGNOSIS — L89312 Pressure ulcer of right buttock, stage 2: Secondary | ICD-10-CM | POA: Diagnosis not present

## 2020-08-04 ENCOUNTER — Other Ambulatory Visit: Payer: Self-pay | Admitting: Internal Medicine

## 2020-08-05 NOTE — Telephone Encounter (Signed)
rx ok'd for tramadol #60 with one refill.

## 2020-08-15 ENCOUNTER — Other Ambulatory Visit: Payer: Self-pay | Admitting: Cardiovascular Disease

## 2020-08-19 DIAGNOSIS — Z23 Encounter for immunization: Secondary | ICD-10-CM | POA: Diagnosis not present

## 2020-09-01 ENCOUNTER — Emergency Department
Admission: EM | Admit: 2020-09-01 | Discharge: 2020-09-01 | Disposition: A | Discharge status: Home / Self Care | Payer: Medicare Other | Attending: Emergency Medicine | Admitting: Emergency Medicine

## 2020-09-01 ENCOUNTER — Other Ambulatory Visit: Payer: Self-pay

## 2020-09-01 ENCOUNTER — Encounter: Payer: Self-pay | Admitting: Emergency Medicine

## 2020-09-01 DIAGNOSIS — Z85820 Personal history of malignant melanoma of skin: Secondary | ICD-10-CM | POA: Insufficient documentation

## 2020-09-01 DIAGNOSIS — K623 Rectal prolapse: Secondary | ICD-10-CM | POA: Insufficient documentation

## 2020-09-01 DIAGNOSIS — Z87891 Personal history of nicotine dependence: Secondary | ICD-10-CM | POA: Insufficient documentation

## 2020-09-01 DIAGNOSIS — I130 Hypertensive heart and chronic kidney disease with heart failure and stage 1 through stage 4 chronic kidney disease, or unspecified chronic kidney disease: Secondary | ICD-10-CM | POA: Insufficient documentation

## 2020-09-01 DIAGNOSIS — Z96643 Presence of artificial hip joint, bilateral: Secondary | ICD-10-CM | POA: Insufficient documentation

## 2020-09-01 DIAGNOSIS — Z7901 Long term (current) use of anticoagulants: Secondary | ICD-10-CM | POA: Insufficient documentation

## 2020-09-01 DIAGNOSIS — R5381 Other malaise: Secondary | ICD-10-CM | POA: Diagnosis not present

## 2020-09-01 DIAGNOSIS — I5033 Acute on chronic diastolic (congestive) heart failure: Secondary | ICD-10-CM | POA: Insufficient documentation

## 2020-09-01 DIAGNOSIS — N189 Chronic kidney disease, unspecified: Secondary | ICD-10-CM | POA: Insufficient documentation

## 2020-09-01 DIAGNOSIS — J45909 Unspecified asthma, uncomplicated: Secondary | ICD-10-CM | POA: Diagnosis not present

## 2020-09-01 DIAGNOSIS — I2510 Atherosclerotic heart disease of native coronary artery without angina pectoris: Secondary | ICD-10-CM | POA: Diagnosis not present

## 2020-09-01 NOTE — ED Triage Notes (Signed)
Patient arrives via EMS from [facility] with a rectal prolapse. 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 Patient is AOx4.
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
## 2020-09-12 DIAGNOSIS — D649 Anemia, unspecified: Secondary | ICD-10-CM | POA: Diagnosis not present

## 2020-09-12 DIAGNOSIS — I10 Essential (primary) hypertension: Secondary | ICD-10-CM | POA: Diagnosis not present

## 2020-09-12 DIAGNOSIS — E7800 Pure hypercholesterolemia, unspecified: Secondary | ICD-10-CM | POA: Diagnosis not present

## 2020-09-12 LAB — LIPID PANEL
Cholesterol: 154 (ref 0–200)
HDL: 73 — AB (ref 35–70)
LDL Cholesterol: 63
Triglycerides: 93 (ref 40–160)

## 2020-09-12 LAB — COMPREHENSIVE METABOLIC PANEL
Albumin: 4.3 (ref 3.5–5.0)
Calcium: 9.8 (ref 8.7–10.7)
GFR calc Af Amer: 52
GFR calc non Af Amer: 45
Globulin: 2.5

## 2020-09-12 LAB — CBC AND DIFFERENTIAL
HCT: 36 (ref 36–46)
Hemoglobin: 11.6 — AB (ref 12.0–16.0)
Neutrophils Absolute: 2530
Platelets: 276 (ref 150–399)
WBC: 4.4

## 2020-09-12 LAB — BASIC METABOLIC PANEL
BUN: 24 — AB (ref 4–21)
CO2: 29 — AB (ref 13–22)
Chloride: 96 — AB (ref 99–108)
Creatinine: 1.1 (ref <=1.1)
Glucose: 101
Potassium: 3.6 (ref 3.4–5.3)
Sodium: 136 — AB (ref 137–147)

## 2020-09-12 LAB — IRON,TIBC AND FERRITIN PANEL
%SAT: 10
Ferritin: 17
Iron: 49

## 2020-09-12 LAB — HEPATIC FUNCTION PANEL
ALT: 17 (ref 7–35)
AST: 21 (ref 13–35)
Alkaline Phosphatase: 66 (ref 25–125)
Bilirubin, Direct: 0.2
Bilirubin, Total: 0.7

## 2020-09-12 LAB — CBC: RBC: 3.73 — AB (ref 3.87–5.11)

## 2020-09-13 DIAGNOSIS — K623 Rectal prolapse: Secondary | ICD-10-CM | POA: Diagnosis not present

## 2020-09-13 DIAGNOSIS — L89311 Pressure ulcer of right buttock, stage 1: Secondary | ICD-10-CM | POA: Diagnosis not present

## 2020-09-13 DIAGNOSIS — L89321 Pressure ulcer of left buttock, stage 1: Secondary | ICD-10-CM | POA: Diagnosis not present

## 2020-09-15 ENCOUNTER — Other Ambulatory Visit: Payer: Self-pay | Admitting: Internal Medicine

## 2020-09-15 NOTE — Telephone Encounter (Signed)
Please check with pharmacy.  In reviewing records, she was given (on 08/05/20) - tramadol #60 with one refill.  In reviewing PDMP, she refilled on 08/05/20.  Should have one refill left. Please clarify with pharmacy.

## 2020-09-19 ENCOUNTER — Telehealth: Payer: Self-pay | Admitting: Internal Medicine

## 2020-09-19 NOTE — Telephone Encounter (Signed)
LM for patient to call back.

## 2020-09-19 NOTE — Telephone Encounter (Signed)
Confirm doing ok and no other acute issues.  Ok to order.  Can do virtual.

## 2020-09-19 NOTE — Telephone Encounter (Signed)
Are you okay with ordering this for her?

## 2020-09-19 NOTE — Telephone Encounter (Signed)
Pt would like a order for a urine culture to be faxed to [facility]  Pt is having urgency and a odor   Luellen Pucker at fax [phone]

## 2020-09-20 ENCOUNTER — Telehealth: Payer: Self-pay | Admitting: Internal Medicine

## 2020-09-20 DIAGNOSIS — R3915 Urgency of urination: Secondary | ICD-10-CM | POA: Diagnosis not present

## 2020-09-20 NOTE — Telephone Encounter (Signed)
Faxed to [facility]

## 2020-09-20 NOTE — Telephone Encounter (Signed)
Order received. Placed out for signature

## 2020-09-20 NOTE — Telephone Encounter (Signed)
Venora Maples, from [facility], [phone]. She will be faxing over orders for urine and medication refills and patient has changed pharmacy to [facility].

## 2020-09-20 NOTE — Telephone Encounter (Signed)
Confirmed nothing acute. Will fax once order has been received.

## 2020-09-20 NOTE — Telephone Encounter (Signed)
Order signed and placed in box. Please make note  (or hold) to follow up on results.

## 2020-09-20 NOTE — Telephone Encounter (Signed)
Verbal given to Luellen Pucker for urine Will fax back once orders received.

## 2020-09-22 ENCOUNTER — Telehealth: Payer: Self-pay | Admitting: Internal Medicine

## 2020-09-22 ENCOUNTER — Other Ambulatory Visit: Payer: Self-pay | Admitting: Internal Medicine

## 2020-09-22 NOTE — Telephone Encounter (Signed)
See previous phone message.  Had received previous notification that pt was concerned about UTI.  Gave orders for urine.  Need results and please confirm pt doing ok.

## 2020-09-22 NOTE — Telephone Encounter (Signed)
Preliminary results only show trace of leukocytes. She is having frequency but thinks that could be coming from her lasix. Would like to see what culture shows once it is resulted.

## 2020-09-22 NOTE — Telephone Encounter (Signed)
Noted.  Please hold until get cx results.  If no results to [place] tomorrow, please call and see if they have a preliminary result.

## 2020-09-23 NOTE — Telephone Encounter (Signed)
Left detailed message for patient.

## 2020-09-23 NOTE — Telephone Encounter (Signed)
Culture results placed out for review. Shows no growth.

## 2020-09-23 NOTE — Telephone Encounter (Signed)
Notify pt that her urine culture is negative.  Let us know if any problems.

## 2020-10-13 ENCOUNTER — Other Ambulatory Visit: Payer: Self-pay | Admitting: Cardiovascular Disease

## 2020-10-13 NOTE — Telephone Encounter (Signed)
Refill Request.  

## 2020-10-13 NOTE — Telephone Encounter (Signed)
Pt last saw Dr Mariah Milling 06/06/20, last labs 07/04/20 Creat 1.09, age 85, weight 81.6kg, based on specified criteria pt is on appropriate dosage of Eliquis 5mg  BID.  Will refill rx.

## 2020-10-24 ENCOUNTER — Other Ambulatory Visit: Payer: Self-pay | Admitting: Internal Medicine

## 2020-10-24 NOTE — Telephone Encounter (Signed)
rx ok'd for tramadol #60 with one refill.  

## 2020-11-02 ENCOUNTER — Other Ambulatory Visit: Payer: Self-pay | Admitting: Cardiovascular Disease

## 2020-11-08 ENCOUNTER — Ambulatory Visit: Payer: Medicare Other | Admitting: Internal Medicine

## 2020-11-09 ENCOUNTER — Encounter: Payer: Self-pay | Admitting: Internal Medicine

## 2020-11-15 ENCOUNTER — Encounter: Payer: Self-pay | Admitting: Internal Medicine

## 2020-11-15 DIAGNOSIS — K623 Rectal prolapse: Secondary | ICD-10-CM | POA: Insufficient documentation

## 2020-11-17 ENCOUNTER — Telehealth: Payer: Self-pay

## 2020-11-17 DIAGNOSIS — R300 Dysuria: Secondary | ICD-10-CM

## 2020-11-17 DIAGNOSIS — R350 Frequency of micturition: Secondary | ICD-10-CM | POA: Diagnosis not present

## 2020-11-17 DIAGNOSIS — R3915 Urgency of urination: Secondary | ICD-10-CM | POA: Diagnosis not present

## 2020-11-17 NOTE — Addendum Note (Signed)
Addended byElpidio Galea T on: 11/17/2020 04:35 PM   Modules accepted: Orders

## 2020-11-17 NOTE — Telephone Encounter (Signed)
Patient feels better. Pt provided the number for the RN at [facility], [phone]. Spoke with Luellen Pucker ,RN from [facility], she stated we will need to send in a DME order and fax to [phone] to have her collect and run the urine. Once she recieves the order then she can collect the urine.

## 2020-11-17 NOTE — Telephone Encounter (Signed)
[facility] order has been sent to [place].

## 2020-11-17 NOTE — Telephone Encounter (Signed)
She is having increased frequency in urination as well as burning when she urinates.         [place] Night - Cl TELEPHONE ADVICE RECORD [facility] Patient Name: Charlotte Wagner Gender: Female DOB: 1935-12-09 Age: 85 Y 17 M 39 D Return Phone Number: [phone] (Primary) Address: City/State/Zip: [place] [address] Client [facility] [place] Night - Cl Client Site [place] Physician [name] - MD Contact Type Call Who Is Calling Patient / Member / Family / Caregiver Call Type Triage / Clinical Relationship To Patient Self Return Phone Number [phone] (Primary) Chief Complaint Urination Pain Reason for Call Symptomatic / Request for [name] states that she is having symptoms of a UTI and she also has covid. She would like to have a urine test done. Caller states that she has a paralyzed vocal chord and it makes her sounds like she is struggling to breath, however that is not the case. She is having increased frequency in urination as well as burning when she urinates. Translation No Nurse Assessment Nurse: Doyle Askew, RN, Beth Date/Time (Eastern Time): 11/16/2020 5:15:28 PM Confirm and document reason for call. If symptomatic, describe symptoms. ---Caller is calling from assisted living facility. Caller states she thinks she has a UTI and has frequency with voiding, also has bladder spasms. No other symptoms at this time. Caller does take cranberry pills regularly for hx of UTI's. Does the patient have any new or worsening symptoms? ---Yes Will a triage be completed? ---Yes Related visit to physician within the last 2 weeks? ---No Does the PT have any chronic conditions? (i.e. diabetes, asthma, this includes High risk factors for pregnancy, etc.) ---Yes List chronic conditions. ---HTN, paralyzed vocal cord, hx of UTIs, rectal prolapse Is this  a behavioral health or substance abuse call? ---No Guidelines Guideline Title Affirmed Question Affirmed Notes Nurse Date/Time (Eastern Time) Urinary Symptoms Urinating more frequently than usual (i.e., frequency) Doyle Askew, RN, Beth 11/16/2020 5:19:50 PM Disp. Time Eilene Ghazi Time) Disposition Final User 11/16/2020 5:24:38 PM See PCP within 24 Hours Yes Doyle Askew, RN, Beth PLEASE NOTE: All timestamps contained within this report are represented as [place] Standard Time. CONFIDENTIALTY NOTICE: This fax transmission is intended only for the addressee. It contains information that is legally privileged, confidential or otherwise protected from use or disclosure. If you are not the intended recipient, you are strictly prohibited from reviewing, disclosing, copying using or disseminating any of this information or taking any action in reliance on or regarding this information. If you have received this fax in error, please notify us immediately by telephone so that we can arrange for its return to [place]. Phone: [phone], Toll-Free: [phone], Fax: [phone] Page: 2 of 2 Call Id: [id number] [place] Disagree/Comply Comply Caller Understands Yes PreDisposition Call another nurse Care Advice Given Per Guideline SEE PCP WITHIN 24 HOURS: * IF OFFICE WILL BE OPEN: You need to be examined within the next 24 hours. Call your doctor (or NP/PA) when the office opens and make an appointment. CALL BACK IF: * Fever occurs * Unable to urinate and bladder feels full * You become worse CARE ADVICE given per Urinary Symptoms (Adult) guideline. Comments User: Melene Muller, RN Date/Time Eilene Ghazi Time): 11/16/2020 5:18:17 PM Caller states nurse at assisted living facility is also notifying dr. Referrals REFERRED TO PCP OFFICE

## 2020-11-17 NOTE — Telephone Encounter (Signed)
Please see how we can get a urine checked and ok to put her on the schedule for virtual visit tomorrow.  May need to do a 4:30 appt.

## 2020-11-17 NOTE — Telephone Encounter (Signed)
Spoke to [place].  Pt feeling better. With covid and urinary symptoms, please schedule virtual visit.  Thanks.

## 2020-11-18 ENCOUNTER — Telehealth (INDEPENDENT_AMBULATORY_CARE_PROVIDER_SITE_OTHER): Payer: Medicare Other | Admitting: Internal Medicine

## 2020-11-18 ENCOUNTER — Other Ambulatory Visit: Payer: Self-pay

## 2020-11-18 DIAGNOSIS — R350 Frequency of micturition: Secondary | ICD-10-CM | POA: Diagnosis not present

## 2020-11-18 DIAGNOSIS — I10 Essential (primary) hypertension: Secondary | ICD-10-CM

## 2020-11-18 DIAGNOSIS — R5383 Other fatigue: Secondary | ICD-10-CM | POA: Diagnosis not present

## 2020-11-18 NOTE — Progress Notes (Signed)
Patient ID: Charlotte Sessions, MD, female   DOB: 10-03-1936, 85 y.o.   MRN: [id number]   Virtual Visit via telephone Note  This visit type was conducted due to national recommendations for restrictions regarding the COVID-19 pandemic (e.g. social distancing).  This format is felt to be most appropriate for this patient at this time.  All issues noted in this document were discussed and addressed.  No physical exam was performed (except for noted visual exam findings with Video Visits).   I connected with Charlotte Wagner by telephone and verified that I am speaking with the correct person using two identifiers. Location patient: home Location provider: work Persons participating in the telephone visit: patient, provider  The limitations, risks, security and privacy concerns of performing an evaluation and management service by telephone and the availability of in person appointments have been discussed.  It has also been discussed with the patient that there may be a patient responsible charge related to this service. The patient expressed understanding and agreed to proceed.   Reason for visit: work in appt  HPI: Work in appt with concerns regarding a possible urinary tract infection.  Symptoms began Tuesday 11/15/20.  Noticed feeling shaky and fatigue.  No fever.  Did not feel like walking.  No respiratory symptoms.  Not orthostatic.  Some diarrhea.  Did have some urinary symptoms - sense of urgency, small amount voided, increased frequency.  No nausea or vomiting.  Occurs intermittently.  Given she was feeling bad, was covid tested.  covid test - negative.  She is feeling better now.  Back to walking.  Walked 1/4 mile yesterday around the campus.  No urinary symptoms now.  Eating.  Breathing stable.  Urinalysis per her report - trace leukocyte esterase, otherwise ok.  Culture pending.  Feels back to her baseline now.    ROS: See pertinent positives and negatives per HPI.  Past Medical History:   Diagnosis Date  . Allergy   . Arthritis    s/p bilateral knees and left hip replacement  . Asthma   . CAD (coronary artery disease)    a. 12/1996 s/p CABG x4 ([place]);  b. 2005 Pt reports stress test & cath, which revealed patent grafts.  . Cancer ([place])    melanoma right arm  . Carotid arterial disease ([place])    a. 04/2015 Carotid U/S: <50% bilat ICA stenosis.  . Chronic kidney disease   . Colon polyps    H/O  . Depression   . GERD (gastroesophageal reflux disease)    h/o hiatal hernia  . Headache    migraines in past  . Heart murmur    a. 04/2011 Echo: EF 55-60%, bilat atrial enlargement, mild to mod TR.  [name] History of chicken pox   . History of hiatal hernia   . Hx of migraines    rare now  . Hx: UTI (urinary tract infection)   . Hyperlipidemia    a. Statin intolerant -->on zetia.  . Hypertension   . Hypertensive heart disease   . Lichen planus   . Melanoma ([place])   . Palpitations    a. rare PVC's and h/o SVT.  [name] PMR (polymyalgia rheumatica) ([facility])    h/o in setting of crestor usage.  [name] PSVT (paroxysmal supraventricular tachycardia) ([place])   . PUD (peptic ulcer disease)    remote history  . Raynaud's phenomenon   . Spinal stenosis   . Urine incontinence    H/O  . Vitamin D deficiency  Past Surgical History:  Procedure Laterality Date  . ADENOIDECTOMY     age 65  . BACK SURGERY     L3-L5  . BILATERAL CARPAL TUNNEL RELEASE    . BREAST BIOPSY Right    bx x 3 neg  . BREAST SURGERY Right    biopsy x 3 (all benign)  . CARDIAC CATHETERIZATION N/A 06/01/2016   Procedure: LEFT HEART CATH AND CORS/GRAFTS ANGIOGRAPHY;  Surgeon: Wellington Hampshire, MD;  Location: [place] [facility];  Service: Cardiovascular;  Laterality: N/A;  . CARDIAC CATHETERIZATION N/A 06/01/2016   Procedure: Coronary Stent Intervention;  Surgeon: Wellington Hampshire, MD;  Location: [place] [facility];  Service: Cardiovascular;  Laterality: N/A;  . CARDIOVERSION N/A 03/08/2017   Procedure:  CARDIOVERSION;  Surgeon: Wellington Hampshire, MD;  Location: [facility];  Service: Cardiovascular;  Laterality: N/A;  . CATARACT EXTRACTION W/PHACO Right 01/04/2016   Procedure: CATARACT EXTRACTION PHACO AND INTRAOCULAR LENS PLACEMENT ([place]);  Surgeon: Leandrew Koyanagi, MD;  Location: [place];  Service: Ophthalmology;  Laterality: Right;  MALYUGIN  . CATARACT EXTRACTION W/PHACO Left 01/25/2016   Procedure: CATARACT EXTRACTION PHACO AND INTRAOCULAR LENS PLACEMENT ([place]) left eye;  Surgeon: Leandrew Koyanagi, MD;  Location: [place];  Service: Ophthalmology;  Laterality: Left;  MALYUGIN SHUGARCAINE  . CHOLECYSTECTOMY  90's  . COLONOSCOPY  03/19/2020   Procedure: COLONOSCOPY;  Surgeon: Virgel Manifold, MD;  [facility];  Service: Gastroenterology;;  . COLONOSCOPY WITH PROPOFOL N/A 05/03/2015   Procedure: COLONOSCOPY WITH PROPOFOL;  Surgeon: Lollie Sails, MD;  Location: [facility] END[facility]COPY;  Service: Endoscopy;  Laterality: N/A;  . COLONOSCOPY WITH PROPOFOL N/A 03/19/2020   Procedure: COLONOSCOPY WITH PROPOFOL;  Surgeon: Virgel Manifold, MD;  Location: [facility] ENDOSCOPY;  Service: Endoscopy;  Laterality: N/A;  . CORONARY ARTERY BYPASS GRAFT  98   4 vessel  . CORONARY STENT INTERVENTION N/A 03/31/2018   Procedure: CORONARY STENT INTERVENTION;  Surgeon: Wellington Hampshire, MD;  Location: [place] [facility];  Service: Cardiovascular;  Laterality: N/A;  . ESOPHAGOGASTRODUODENOSCOPY N/A 03/22/2015   Procedure: ESOPHAGOGASTRODUODENOSCOPY (EGD);  Surgeon: Lollie Sails, MD;  Location: [facility] ENDOSCOPY;  Service: Endoscopy;  Laterality: N/A;  . ESOPHAGOGASTRODUODENOSCOPY N/A 03/19/2020   Procedure: ESOPHAGOGASTRODUODENOSCOPY (EGD);  Surgeon: Virgel Manifold, MD;  Location: [facility] ENDOSCOPY;  Service: Endoscopy;  Laterality: N/A;  . HEMORRHOID BANDING    . HEMORRHOID SURGERY N/A 02/17/2018   Procedure: HEMORRHOIDECTOMY;  Surgeon: Robert Bellow, MD;  Location: [facility]  ORS;  Service: General;  Laterality: N/A;  . JOINT REPLACEMENT     BILATERAL KNEE REPLACEMENTS  . KNEE ARTHROSCOPY W/ OATS PROCEDURE     Lt knee (9/01), Rt knee (3/11), Lt hip (5/10)  . LEFT HEART CATH AND CORONARY ANGIOGRAPHY N/A 03/31/2018   Procedure: LEFT HEART CATH AND CORONARY ANGIOGRAPHY;  Surgeon: Wellington Hampshire, MD;  Location: [place] [facility];  Service: Cardiovascular;  Laterality: N/A;  . TOTAL HIP ARTHROPLASTY      Family History  Problem Relation Age of Onset  . Thyroid disease Mother        graves disease  . Heart disease Father        rheumatic heart  . Alcohol abuse Father   . Depression Father   . Lung cancer Sister   . Depression Daughter   . Thyroid disease Daughter        hashimoto  . Depression Son   . Stroke Maternal Grandmother   . Depression Maternal Grandmother   .  Hypertension Maternal Grandfather   . Hypertension Paternal Grandfather   . Seizures Son   . Breast cancer Neg Hx     SOCIAL HX: reviewed.    Current Outpatient Medications:  .  acetaminophen (TYLENOL) 500 MG tablet, Take 1,000 mg by mouth 2 (two) times daily., Disp: , Rfl:  .  amitriptyline (ELAVIL) 25 MG tablet, TAKE 1 TABLET BY MOUTH AT BEDTIME, Disp: 30 tablet, Rfl: 5 .  b complex vitamins tablet, Take 1 tablet by mouth daily., Disp: , Rfl:  .  Cholecalciferol (VITAMIN D3) 2000 UNITS TABS, Take 2,000 Units by mouth 2 (two) times a week. , Disp: , Rfl:  .  clotrimazole (MYCELEX) 10 MG troche, DISSLOVE 1 TABLET IN MOUTH 5 TIMES A DAYUNTIL FINISHED, Disp: 60 tablet, Rfl: 0 .  Coenzyme Q10 (COQ-10) 200 MG CAPS, Take 200 mg by mouth daily. , Disp: , Rfl:  .  conjugated estrogens (PREMARIN) vaginal cream, Apply 0.5mg  (pea-sized amount)  just inside the vaginal introitus with a finger-tip on  Monday, Wednesday and Friday nights. (Patient taking differently: as needed. Apply 0.5mg  (pea-sized amount)  just inside the vaginal introitus with a finger-tip on  Monday, Wednesday and Friday  nights.), Disp: 30 g, Rfl: 12 .  CRANBERRY PO, Take 1 capsule by mouth daily., Disp: , Rfl:  .  ELIQUIS 5 MG TABS tablet, TAKE 1 TABLET BY MOUTH TWICE DAILY, Disp: 180 tablet, Rfl: 1 .  ezetimibe (ZETIA) 10 MG tablet, TAKE 1 TABLET BY MOUTH DAILY, Disp: 90 tablet, Rfl: 3 .  furosemide (LASIX) 40 MG tablet, Take 1 tablet (40 mg total) by mouth 2 (two) times daily., Disp: 180 tablet, Rfl: 3 .  hydrocortisone 2.5 % cream, Apply to VULVA at bedtime as needed with miconazole for lichen planus, Disp: 28 g, Rfl: 0 .  loratadine (CLARITIN) 10 MG tablet, Take 10 mg by mouth daily., Disp: , Rfl:  .  Multiple Vitamins-Minerals (HAIR/SKIN/NAILS PO), Take 1 tablet by mouth daily., Disp: , Rfl:  .  mupirocin ointment (BACTROBAN) 2 %, Apply to affected area bid (Patient taking differently: Apply to affected area bid as needed), Disp: 22 g, Rfl: 0 .  pantoprazole (PROTONIX) 40 MG tablet, TAKE 1 TABLET(40 MG) BY MOUTH TWICE DAILY, Disp: 180 tablet, Rfl: 2 .  polyethylene glycol powder (GLYCOLAX/MIRALAX) 17 GM/SCOOP powder, Take 8.5 g by mouth daily. Hold if diarrhea, Disp: 255 g, Rfl: 0 .  PREVIDENT 5000 BOOSTER PLUS 1.1 % PSTE, USE TID AFTER MEALS . DO NOT RINSE, Disp: , Rfl:  .  ramipril (ALTACE) 5 MG capsule, TAKE 1 CAPSULE(5 MG) BY MOUTH DAILY, Disp: 90 capsule, Rfl: 3 .  REPATHA SURECLICK 024 MG/ML SOAJ, INJECT 1 PEN EVERY 14 DAYS AS DIRECTED, Disp: 2 mL, Rfl: 5 .  spironolactone (ALDACTONE) 25 MG tablet, TAKE 1 TABLET BY MOUTH EVERY MORNING, Disp: 90 tablet, Rfl: 1 .  traMADol (ULTRAM) 50 MG tablet, TAKE ONE (1) TABLET BY MOUTH TWO TIMES PER DAY AS NEEDED, Disp: 60 tablet, Rfl: 1 .  traZODone (DESYREL) 150 MG tablet, TAKE 1 TO 1 & 1/3 TABLETS EACH NIGHT AT BEDTIME., Disp: 90 tablet, Rfl: 1 .  triamcinolone cream (KENALOG) 0.1 %, Apply topically 2 (two) times daily., Disp: , Rfl:  .  verapamil (CALAN-SR) 180 MG CR tablet, TAKE 1 TABLET(180 MG) BY MOUTH TWICE DAILY, Disp: 180 tablet, Rfl: 3 .  vitamin B-12  (CYANOCOBALAMIN) 1000 MCG tablet, Take 1,000 mcg by mouth daily., Disp: , Rfl:  .  vitamin C (  ASCORBIC ACID) 500 MG tablet, Take 500 mg by mouth at bedtime., Disp: , Rfl:   EXAM:  VITALS per patient if applicable: 436/06  GENERAL: alert. Sounds to be in no acute distress.  Answering questions appropriately.   PSYCH/NEURO: pleasant and cooperative, no obvious depression or anxiety, speech and thought processing grossly intact  ASSESSMENT AND PLAN:  Discussed the following assessment and plan:  Problem List Items Addressed This Visit    Essential hypertension, benign    Blood pressure doing well as outlined.  Continue verapamil, aldactone, lasix and altace.        Fatigue    Increased fatigue as outlined.  Better now.  covid test negative.  Hold on abx.  Await culture results.  She feels back to baseline.        Urinary frequency    Recent increased urgency and increased urinary frequency.  Concern over possible uti.  Initial urine with trace leukocyte esterase per pt.  Need copy of lab.  Waiting for urine culture results. Since she is doing better, hold on abx.  Follow closely.  Obtain culture results.            I discussed the assessment and treatment plan with the patient. The patient was provided an opportunity to ask questions and all were answered. The patient agreed with the plan and demonstrated an understanding of the instructions.   The patient was advised to call back or seek an in-person evaluation if the symptoms worsen or if the condition fails to improve as anticipated.  I provided 23 minutes of non-face-to-face time during this encounter.   [name], MD

## 2020-11-18 NOTE — Telephone Encounter (Signed)
Pt scheduled  

## 2020-11-18 NOTE — Telephone Encounter (Signed)
LM to call back.

## 2020-11-20 ENCOUNTER — Telehealth: Payer: Self-pay | Admitting: Internal Medicine

## 2020-11-20 ENCOUNTER — Encounter: Payer: Self-pay | Admitting: Internal Medicine

## 2020-11-20 DIAGNOSIS — R5383 Other fatigue: Secondary | ICD-10-CM | POA: Insufficient documentation

## 2020-11-20 DIAGNOSIS — R350 Frequency of micturition: Secondary | ICD-10-CM | POA: Insufficient documentation

## 2020-11-20 NOTE — Assessment & Plan Note (Signed)
Increased fatigue as outlined.  Better now.  covid test negative.  Hold on abx.  Await culture results.  She feels back to baseline.

## 2020-11-20 NOTE — Assessment & Plan Note (Signed)
Blood pressure doing well as outlined.  Continue verapamil, aldactone, lasix and altace.   

## 2020-11-20 NOTE — Telephone Encounter (Signed)
Need urinalysis and culture results.  We ordered and faxed to [facility] end of last week.

## 2020-11-20 NOTE — Assessment & Plan Note (Signed)
Recent increased urgency and increased urinary frequency.  Concern over possible uti.  Initial urine with trace leukocyte esterase per pt.  Need copy of lab.  Waiting for urine culture results. Since she is doing better, hold on abx.  Follow closely.  Obtain culture results.

## 2020-11-21 NOTE — Telephone Encounter (Signed)
Called and LM for Luellen Pucker to send over urine results or call if there is a problem.

## 2020-11-23 ENCOUNTER — Telehealth: Payer: Self-pay | Admitting: Internal Medicine

## 2020-11-23 NOTE — Telephone Encounter (Signed)
Called and spoke with Charlotte Wagner. She is going to fax over results to my attention. Stated that urine results were clear.

## 2020-11-23 NOTE — Telephone Encounter (Signed)
Left detailed message for patient.

## 2020-11-23 NOTE — Telephone Encounter (Signed)
Reviewed urine and labs.  Notify pt that her urine culture is negative for infection. Please confirm she is doing ok.

## 2020-12-01 ENCOUNTER — Other Ambulatory Visit: Payer: Self-pay | Admitting: Internal Medicine

## 2020-12-05 NOTE — Progress Notes (Signed)
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 Other    6 month f/u c/o sob. Meds reviewed verbally with pt.    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  coronary artery disease  prior bypass surgery 1998 stent to  graft 8/17  Echo EF 1/18 with EF 60-65%   moderate to severe left atrial enlargement cardioversion that did not hold,  reverted to atrial fibrillation Did not feel well on b-blocker, better on ca channel blocker Statin intolerance , on Repatha Who presents for routine follow-up of her chronic stable angina, atrial fibrillation   Ms. Boal reports overall doing well Some orthostasis,  Lives at [facility], assisted living  Continues to use a walker Wears compression hose, swelling slightly worse on the left than the right Continues on the Lasix 40 twice daily Weight stable 180 pounds  legs are weaker ("weaker and weaker")  "feels breathless sometimes" Not on oxygen  Larynx paralyzed, difficulty talking  Total cholesterol 154 LDL 63  EKG personally reviewed by myself on todays visit  atrial fib rate 64 PVCs poor R wave progression to the anterior precordial leads  Other past medical history reviewed  Prior echocardiogram again reviewed with her from 02/2020 Left ventricular ejection fraction, by estimation, is 60 to 65%. The  left ventricle has normal function. right ventricular size is moderately enlarged. severely elevated  pulmonary artery systolic pressure.  TR Peak grad:  61.8 mmHg  TR Vmax:    393.00 cm/s   Cath 03/31/2018 Significant underlying three-vessel  coronary artery disease with patent grafts including LIMA to LAD, SVG to RCA, SVG to OM and SVG to diagonal.  Patent stent in the SVG to RCA.  Severe new stenosis and SVG to diagonal is likely the culprit.  STENT RESOLUTE ONYX 4.0X15. to SVG to diag  hospital admission for acute on chronic diastolic and systolic CHF Cath 7/61/9509 . Significant underlying three-vessel coronary artery disease with patent grafts including LIMA to LAD, SVG to RCA, SVG to OM and SVG to diagonal. Patent stent in the SVG to RCA. Severe new stenosis and SVG to diagonal is likely the culprit. 2. Left ventricular angiography was not performed. 3. High normal left ventricular end-diastolic pressure at 12 mmHg. 4. Successful direct stenting of SVG to diagonal using a distal protection device.  ECHO Prior to cardiac catheterization and stent placement ejection fraction was in the range of 30% to 35%. Hypokinesis of the anteroseptalmyocardium, apical,  anterior myocardium.  - Mitral valve: There was mild regurgitation. - Left atrium: The atrium was mildly to moderately dilated. - Right ventricle: Systolic function was moderately to severely reduced. - Right atrium: The atrium was mildly dilated.  patent foramen ovale. - Tricuspid valve: moderate-severe regurgitation. - Pulmonary arteries: Systolic pressure was moderate to severely elevated PA peak pressure: 58 mm Hg (S).  long history of Statin intolerance, tried 4 different medications, had severe myalgias,  Lipitor,crestor, simvastatin all had severe side effects  on Zetia, tolerating this Cholesterol still above goal, LDL 112 on Zetia alone  History of bleeding  from hemorrhoids. Stable recently  Episode of amnesia 03/12/2017 Workup including EEG No further episodes since that time  Carotid ultrasound reviewed with her in detail Moderate atherosclerotic plaque over the left carotid bulb    Prior CV studies:   The following studies were  reviewed today:   Past Medical History:  Diagnosis Date  . Allergy   . Arthritis    s/p bilateral knees and left hip replacement  . Asthma   . CAD (coronary artery disease)    a. 12/1996 s/p CABG x4 ([place]);  b. 2005 Pt reports stress test & cath, which revealed patent grafts.  . Cancer ([place])    melanoma right arm  . Carotid arterial disease ([place])    a. 04/2015 Carotid U/S: <50% bilat ICA stenosis.  . Chronic kidney disease   . Colon polyps    H/O  . Depression   . GERD (gastroesophageal reflux disease)    h/o hiatal hernia  . Headache    migraines in past  . Heart murmur    a. 04/2011 Echo: EF 55-60%, bilat atrial enlargement, mild to mod TR.  [name] History of chicken pox   . History of hiatal hernia   . Hx of migraines    rare now  . Hx: UTI (urinary tract infection)   . Hyperlipidemia    a. Statin intolerant -->on zetia.  . Hypertension   . Hypertensive heart disease   . Lichen planus   . Melanoma ([place])   . Palpitations    a. rare PVC's and h/o SVT.  [name] PMR (polymyalgia rheumatica) (HCC)    h/o in setting of crestor usage.  [name] PSVT (paroxysmal supraventricular tachycardia) ([place])   . PUD (peptic ulcer disease)    remote history  . Raynaud's phenomenon   . Spinal stenosis   . Urine incontinence    H/O  . Vitamin D deficiency    Past Surgical History:  Procedure Laterality Date  . ADENOIDECTOMY     age 70  . BACK SURGERY     L3-L5  . BILATERAL CARPAL TUNNEL RELEASE    . BREAST BIOPSY Right    bx x 3 neg  . BREAST SURGERY Right    biopsy x 3 (all benign)  . CARDIAC CATHETERIZATION N/A 06/01/2016   Procedure: LEFT HEART CATH AND CORS/GRAFTS ANGIOGRAPHY;  Surgeon: Wellington Hampshire, MD;  Location: [place] [facility];  Service: Cardiovascular;  Laterality: N/A;  . CARDIAC CATHETERIZATION N/A 06/01/2016   Procedure: Coronary Stent Intervention;  Surgeon: Wellington Hampshire, MD;  Location: [place] [facility];  Service: Cardiovascular;  Laterality: N/A;  .  CARDIOVERSION N/A 03/08/2017   Procedure: CARDIOVERSION;  Surgeon: Wellington Hampshire, MD;  Location: [facility];  Service: Cardiovascular;  Laterality: N/A;  . CATARACT EXTRACTION W/PHACO Right 01/04/2016   Procedure: CATARACT EXTRACTION PHACO AND INTRAOCULAR LENS PLACEMENT ([place]);  Surgeon: Leandrew Koyanagi, MD;  Location: [place];  Service: Ophthalmology;  Laterality: Right;  MALYUGIN  . CATARACT EXTRACTION W/PHACO Left 01/25/2016   Procedure: CATARACT EXTRACTION PHACO AND INTRAOCULAR LENS PLACEMENT ([place]) left eye;  Surgeon: Leandrew Koyanagi, MD;  Location: [place];  Service: Ophthalmology;  Laterality: Left;  MALYUGIN SHUGARCAINE  . CHOLECYSTECTOMY  90's  . COLONOSCOPY  03/19/2020   Procedure: COLONOSCOPY;  Surgeon: Virgel Manifold, MD;  Location: [facility];  Service: Gastroenterology;;  . COLONOSCOPY WITH PROPOFOL N/A 05/03/2015   Procedure: COLONOSCOPY WITH PROPOFOL;  Surgeon: Lollie Sails, MD;  Location: [facility] ENDOSCOPY;  Service: Endoscopy;  Laterality: N/A;  . COLONOSCOPY WITH PROPOFOL N/A 03/19/2020   Procedure: COLONOSCOPY WITH PROPOFOL;  Surgeon: Virgel Manifold, MD;  Location: [facility] ENDOSCOPY;  Service: Endoscopy;  Laterality: N/A;  . CORONARY ARTERY BYPASS GRAFT  98   4 vessel  . CORONARY STENT INTERVENTION N/A 03/31/2018   Procedure: CORONARY STENT INTERVENTION;  Surgeon: Wellington Hampshire, MD;  Location: [place] [facility];  Service: Cardiovascular;  Laterality: N/A;  . ESOPHAGOGASTRODUODENOSCOPY N/A 03/22/2015   Procedure: ESOPHAGOGASTRODUODENOSCOPY (EGD);  Surgeon: Lollie Sails, MD;  Location: [facility] ENDOSCOPY;  Service: Endoscopy;  Laterality: N/A;  . ESOPHAGOGASTRODUODENOSCOPY N/A 03/19/2020   Procedure: ESOPHAGOGASTRODUODENOSCOPY (EGD);  Surgeon: Virgel Manifold, MD;  Location: [facility] ENDOSCOPY;  Service: Endoscopy;  Laterality: N/A;  . HEMORRHOID BANDING    . HEMORRHOID SURGERY N/A 02/17/2018   Procedure: HEMORRHOIDECTOMY;   Surgeon: Robert Bellow, MD;  Location: [facility];  Service: General;  Laterality: N/A;  . JOINT REPLACEMENT     BILATERAL KNEE REPLACEMENTS  . KNEE ARTHROSCOPY W/ [place]ATS PROCEDURE     Lt knee (9/01), Rt knee (3/11), Lt hip (5/10)  . LEFT HEART CATH AND CORONARY ANGIOGRAPHY N/A 03/31/2018   Procedure: LEFT HEART CATH AND CORONARY ANGIOGRAPHY;  Surgeon: Wellington Hampshire, MD;  Location: [place] [facility];  Service: Cardiovascular;  Laterality: N/A;  . TOTAL HIP ARTHROPLASTY       Current Meds  Medication Sig  . acetaminophen (TYLENOL) 500 MG tablet Take 1,000 mg by mouth 2 (two) times daily.  [name] amitriptyline (ELAVIL) 25 MG tablet TAKE 1 TABLET BY MOUTH AT BEDTIME  . b complex vitamins tablet Take 1 tablet by mouth daily.  . Cholecalciferol (VITAMIN D3) 2000 UNITS TABS Take 2,000 Units by mouth 2 (two) times a week. Tuesday and Saturdays.  . clotrimazole (MYCELEX) 10 MG troche Take 10 mg by mouth as needed.  . Coenzyme Q10 (COQ-10) 200 MG CAPS Take 200 mg by mouth daily.   [name] conjugated estrogens (PREMARIN) vaginal cream Apply 0.5mg  (pea-sized amount)  just inside the vaginal introitus with a finger-tip on  Monday, Wednesday and Friday nights. (Patient taking differently: Apply 0.5mg  (pea-sized amount)  just inside the vaginal introitus with a finger-tip on  Monday, Wednesday and Friday nights.)  . CRANBERRY PO Take 1 capsule by mouth daily.  [name] ELIQUIS 5 MG TABS tablet TAKE 1 TABLET BY MOUTH TWICE DAILY  . ezetimibe (ZETIA) 10 MG tablet TAKE 1 TABLET BY MOUTH DAILY  . hydrocortisone 2.5 % cream Apply to VULVA at bedtime as needed with miconazole for lichen planus  . loratadine (CLARITIN) 10 MG tablet Take 10 mg by mouth daily.  . Multiple Vitamins-Minerals (HAIR/SKIN/NAILS PO) Take 1 tablet by mouth daily.  . mupirocin ointment (BACTROBAN) 2 % Apply to affected area bid  . pantoprazole (PROTONIX) 40 MG tablet TAKE 1 TABLET BY MOUTH TWICE DAILY  . polyethylene glycol powder  (GLYCOLAX/MIRALAX) 17 GM/SCOOP powder Take 8.5 g by mouth daily. Hold if diarrhea  . PREVIDENT 5000 BOOSTER PLUS 1.1 % PSTE USE TID AFTER MEALS . DO NOT RINSE  . ramipril (ALTACE) 5 MG capsule TAKE 1 CAPSULE(5 MG) BY MOUTH DAILY  . REPATHA SURECLICK 631 MG/ML SOAJ INJECT 1 PEN EVERY 14 DAYS AS DIRECTED  . spironolactone (ALDACTONE) 25 MG tablet TAKE 1 TABLET BY MOUTH EVERY MORNING  . traMADol (ULTRAM) 50 MG tablet TAKE ONE (1) TABLET BY MOUTH TWO TIMES PER DAY AS NEEDED  . traZODone (DESYREL) 150 MG tablet TAKE  1 TO 1 & 1/3 TABLETS EACH NIGHT AT BEDTIME.  [name] triamcinolone cream (KENALOG) 0.1 % Apply topically 2 (two) times daily.  . verapamil (CALAN-SR) 180 MG CR tablet TAKE 1 TABLET(180 MG) BY MOUTH TWICE DAILY  . vitamin B-12 (CYANOCOBALAMIN) 1000 MCG tablet Take 1,000 mcg by mouth daily.  . vitamin C (ASCORBIC ACID) 500 MG tablet Take 500 mg by mouth at bedtime.  . [DISCONTINUED] furosemide (LASIX) 40 MG tablet Take 1 tablet (40 mg total) by mouth 2 (two) times daily.     Allergies:   Beta adrenergic blockers; Brilinta [ticagrelor]; Albuterol; Antihistamines, diphenhydramine-type; Aspirin; Nsaids; Penicillins; Statins; Sulfa antibiotics; Sulfasalazine; Tetracycline; and Tetracyclines & related   Social History   Tobacco Use  . Smoking status: Former Smoker    Types: Cigarettes  . Smokeless tobacco: Never Used  . Tobacco comment: quit 47+ yrs ago  Vaping Use  . Vaping Use: Never used  Substance Use Topics  . Alcohol use: No    Alcohol/week: 0.0 standard drinks  . Drug use: No      Family Hx: The patient's family history includes Alcohol abuse in her father; Depression in her daughter, father, maternal grandmother, and son; Heart disease in her father; Hypertension in her maternal grandfather and paternal grandfather; Lung cancer in her sister; Seizures in her son; Stroke in her maternal grandmother; Thyroid disease in her daughter and mother. There is no history of Breast  cancer.  ROS:   Please see the history of present illness.    Review of Systems  Constitutional: Negative.   HENT: Negative.   Respiratory: Positive for shortness of breath.   Cardiovascular: Negative.   Gastrointestinal: Negative.   Musculoskeletal: Negative.        Gait instability  Neurological: Negative.   Psychiatric/Behavioral: Negative.   All other systems reviewed and are negative.    Labs/Other Tests and Data Reviewed:    Recent Labs: 02/03/2020: TSH 2.004 03/18/2020: Magnesium 2.0 09/12/2020: ALT 17; BUN 24; Creatinine 1.1; Hemoglobin 11.6; Platelets 276; Potassium 3.6; Sodium 136   Recent Lipid Panel Lab Results  Component Value Date/Time   CHOL 154 09/12/2020 12:00 AM   TRIG 93 09/12/2020 12:00 AM   HDL 73 (A) 09/12/2020 12:00 AM   CHOLHDL 2.2 02/03/2020 09:03 AM   LDLCALC 63 09/12/2020 12:00 AM   LDLDIRECT 126.7 07/03/2013 09:37 AM    Wt Readings from Last 3 Encounters:  12/06/20 181 lb 2 oz (82.2 kg)  09/01/20 179 lb 14.3 oz (81.6 kg)  07/04/20 180 lb (81.6 kg)     Exam:    Vital Signs: Vital signs may also be detailed in the HPI BP 110/70 (BP Location: Right Arm, Patient Position: Sitting, Cuff Size: Normal)   Pulse 64   Ht 5' 2.5" (1.588 m)   Wt 181 lb 2 oz (82.2 kg)   SpO2 98%   BMI 32.60 kg/m    Constitutional:  oriented to person, place, and time. No distress.  HENT:  Head: Grossly normal Eyes:  no discharge. No scleral icterus.  Neck: No JVD, no carotid bruits  Cardiovascular: Regular rate and rhythm, no murmurs appreciated Pulmonary/Chest: Clear to auscultation bilaterally, no wheezes or rails Abdominal: Soft.  no distension.  no tenderness.  Musculoskeletal: Normal range of motion Neurological:  normal muscle tone. Coordination normal. No atrophy Skin: Skin warm and dry Psychiatric: normal affect, pleasant  ASSESSMENT & PLAN:    Persistent atrial fibrillation Rate controlled, on anticoagulation No medication changes  made  Pulmonary hypertension  Lasix  40 in Am, 20 in PM She is continued about low GFR, will drop lasix back a little, was on lasix 40 BID Appears euvolemic Continue spironolactone Weight stable  Chronic diastolic heart failure (HCC)  Lasix 40 in the mornings Prefers to sometimes take 20 mg in the p.m. up to 40 mg Concerned about low GFR on recent lab work  Coronary artery disease of native artery of native heart with stable angina pectoris ([place]) Currently with no symptoms of angina. No further workup at this time. Continue current medication regimen.  Hypercholesterolemia Cholesterol is at goal on the current lipid regimen. No changes to the medications were made.  Essential hypertension, benign Blood pressure is well controlled on today's visit. No changes made to the medications.  Dyspnea, unspecified type  pulmonary hypertension, kyphosis, deconditioning, atrial fibrillation Legs weak, limited exercise Thinks things are stable  TGA (transient global amnesia) Denies any further episodes, cognition stable stable   Total encounter time more than 25 minutes  Greater than 50% was spent in counseling and coordination of care with the patient    Signed, Ida Rogue, MD  12/06/2020 3:37 PM    [place] Office [address], [place], [place] [address]

## 2020-12-06 ENCOUNTER — Other Ambulatory Visit: Payer: Self-pay

## 2020-12-06 ENCOUNTER — Encounter: Payer: Self-pay | Admitting: Cardiovascular Disease

## 2020-12-06 ENCOUNTER — Ambulatory Visit (INDEPENDENT_AMBULATORY_CARE_PROVIDER_SITE_OTHER): Payer: Medicare Other | Admitting: Cardiovascular Disease

## 2020-12-06 VITALS — BP 110/70 | HR 64 | Ht 62.5 in | Wt 181.1 lb

## 2020-12-06 DIAGNOSIS — I4819 Other persistent atrial fibrillation: Secondary | ICD-10-CM

## 2020-12-06 DIAGNOSIS — E785 Hyperlipidemia, unspecified: Secondary | ICD-10-CM | POA: Diagnosis not present

## 2020-12-06 DIAGNOSIS — I25118 Atherosclerotic heart disease of native coronary artery with other forms of angina pectoris: Secondary | ICD-10-CM

## 2020-12-06 DIAGNOSIS — I119 Hypertensive heart disease without heart failure: Secondary | ICD-10-CM

## 2020-12-06 DIAGNOSIS — R0600 Dyspnea, unspecified: Secondary | ICD-10-CM

## 2020-12-06 DIAGNOSIS — I10 Essential (primary) hypertension: Secondary | ICD-10-CM | POA: Diagnosis not present

## 2020-12-06 DIAGNOSIS — I5042 Chronic combined systolic (congestive) and diastolic (congestive) heart failure: Secondary | ICD-10-CM | POA: Diagnosis not present

## 2020-12-06 MED ORDER — FUROSEMIDE 40 MG PO TABS: 40.0000 mg | ORAL_TABLET | Freq: Two times a day (BID) | ORAL | 3 refills | Status: DC

## 2020-12-06 NOTE — Patient Instructions (Addendum)
Medication Instructions:   Lasix 40 mg BID (morning and evening)  May give 20 mg in the evening if pt appears dry (dehydrated), instead of the 40 mg   Lab work: No new labs needed  Testing/Procedures: No new testing needed  Follow-Up:   . You will need a follow up appointment in 6 months  . Providers on your designated Care Team:   . Murray Hodgkins, NP . Christell Faith, PA-C . Marrianne Mood, PA-C    COVID-19 Vaccine Information can be found at: ShippingScam.co.uk For questions related to vaccine distribution or appointments, please email vaccine@[facility] .com or call [phone].

## 2020-12-08 DIAGNOSIS — D225 Melanocytic nevi of trunk: Secondary | ICD-10-CM | POA: Diagnosis not present

## 2020-12-08 DIAGNOSIS — L89321 Pressure ulcer of left buttock, stage 1: Secondary | ICD-10-CM | POA: Diagnosis not present

## 2020-12-08 DIAGNOSIS — Z85820 Personal history of malignant melanoma of skin: Secondary | ICD-10-CM | POA: Diagnosis not present

## 2020-12-08 DIAGNOSIS — Z85828 Personal history of other malignant neoplasm of skin: Secondary | ICD-10-CM | POA: Diagnosis not present

## 2020-12-08 DIAGNOSIS — L89311 Pressure ulcer of right buttock, stage 1: Secondary | ICD-10-CM | POA: Diagnosis not present

## 2020-12-08 DIAGNOSIS — L821 Other seborrheic keratosis: Secondary | ICD-10-CM | POA: Diagnosis not present

## 2020-12-08 DIAGNOSIS — L03011 Cellulitis of right finger: Secondary | ICD-10-CM | POA: Diagnosis not present

## 2020-12-22 ENCOUNTER — Other Ambulatory Visit: Payer: Self-pay | Admitting: Cardiovascular Disease

## 2020-12-30 ENCOUNTER — Other Ambulatory Visit: Payer: Self-pay | Admitting: Internal Medicine

## 2020-12-30 NOTE — Telephone Encounter (Signed)
Pt called yo follow up on refill request

## 2020-12-31 MED ORDER — TRAMADOL HCL 50 MG PO TABS: 50.0000 mg | ORAL_TABLET | Freq: Two times a day (BID) | ORAL | 1 refills | Status: DC | PRN

## 2020-12-31 NOTE — Telephone Encounter (Signed)
rx sent in for tramadol #60 with one refill.

## 2020-12-31 NOTE — Addendum Note (Signed)
Addended by: Alisa Graff on: 12/31/2020 04:46 AM   Modules accepted: Orders

## 2021-01-05 ENCOUNTER — Other Ambulatory Visit: Payer: Self-pay | Admitting: Cardiovascular Disease

## 2021-01-10 ENCOUNTER — Encounter: Payer: Self-pay | Admitting: Internal Medicine

## 2021-01-10 ENCOUNTER — Ambulatory Visit (INDEPENDENT_AMBULATORY_CARE_PROVIDER_SITE_OTHER): Payer: Medicare Other | Admitting: Internal Medicine

## 2021-01-10 ENCOUNTER — Other Ambulatory Visit: Payer: Self-pay

## 2021-01-10 VITALS — Temp 97.7°F | Ht 62.0 in | Wt 182.0 lb

## 2021-01-10 DIAGNOSIS — R490 Dysphonia: Secondary | ICD-10-CM

## 2021-01-10 DIAGNOSIS — R42 Dizziness and giddiness: Secondary | ICD-10-CM | POA: Diagnosis not present

## 2021-01-10 DIAGNOSIS — J040 Acute laryngitis: Secondary | ICD-10-CM | POA: Diagnosis not present

## 2021-01-10 DIAGNOSIS — R269 Unspecified abnormalities of gait and mobility: Secondary | ICD-10-CM | POA: Diagnosis not present

## 2021-01-10 DIAGNOSIS — J3800 Paralysis of vocal cords and larynx, unspecified: Secondary | ICD-10-CM | POA: Diagnosis not present

## 2021-01-10 MED ORDER — MECLIZINE HCL 25 MG PO TABS: 25.0000 mg | ORAL_TABLET | Freq: Three times a day (TID) | ORAL | 2 refills | Status: DC | PRN

## 2021-01-10 NOTE — Progress Notes (Signed)
Chief Complaint  Patient presents with  . balance    Off balance, ear check   F/u  1. 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 She tried meclizine 12.5 tid w/o help. She had a fall and is not sore or in any paint from the fall she did have a bruise on right should and buttocks She wants the Epley Maneuver at [facility] PT or with ENT Dr. Tami Ribas given order today to get PT at [facility] for this  Of note she reports h/o C4/5 vertebra issues in the past  Orthostatics lying down 118/78 HR 75 SpO2 97, sitting 138/92 HR 73 spo2 96%, standing 148/78 HR 101 spo2 97% 2. Chronic hoarseness since 2019 and vocal cord is paralyzed at times has to turn her head a certain way to swallow due to trouble with swallowing at times or she will cough  Review of Systems  Constitutional: Negative for weight loss.  HENT:       +hoarse voice  Respiratory: Negative for shortness of breath.   Cardiovascular: Negative for chest pain.  Musculoskeletal: Positive for falls.  Neurological: Positive for dizziness. Negative for headaches.       +off balance  Psychiatric/Behavioral: Negative for memory loss.   Past Medical History:  Diagnosis Date  . Allergy   . Arthritis    s/p bilateral knees and left hip replacement  . Asthma   . CAD (coronary artery disease)    a. 12/1996 s/p CABG x4 ([place]);  b. 2005 Pt reports stress test & cath, which revealed patent grafts.  . Cancer ([place])    melanoma right arm  . Carotid arterial disease ([place])    a. 04/2015 Carotid U/S: <50% bilat ICA stenosis.  . Chronic kidney disease   . Colon polyps    H/O  . Depression   . GERD (gastroesophageal reflux disease)    h/o hiatal hernia  . Headache    migraines in past  . Heart murmur    a. 04/2011 Echo: EF 55-60%,  bilat atrial enlargement, mild to mod TR.  [name] History of chicken pox   . History of hiatal hernia   . Hx of migraines    rare now  . Hx: UTI (urinary tract infection)   . Hyperlipidemia    a. Statin intolerant -->on zetia.  . Hypertension   . Hypertensive heart disease   . Lichen planus   . Melanoma ([place])   . Palpitations    a. rare PVC's and h/o SVT.  [name] PMR (polymyalgia rheumatica) (H[facility])    h/o in setting of crestor usage.  [name] PSVT (paroxysmal supraventricular tachycardia) ([place])   . PUD (peptic ulcer disease)    remote history  . Raynaud's phenomenon   . Spinal stenosis   . Urine incontinence    H/O  . Vitamin D deficiency    Past Surgical History:  Procedure Laterality Date  . ADENOIDECTOMY     age 39  . BACK SURGERY     L3-L5  . BILATERAL CARPAL TUNNEL RELEASE    . BREAST BIOPSY Right    bx x 3 neg  . BREAST SURGERY Right    biopsy x 3 (all benign)  . CARDIAC CATHETERIZATION N/A 06/01/2016   Procedure: LEFT HEART CATH AND CORS/GRAFTS ANGIOGRAPHY;  Surgeon: Wellington Hampshire, MD;  Location: [place] [facility];  Service: Cardiovascular;  Laterality: N/A;  . CARDIAC CATHETERIZATION N/A 06/01/2016   Procedure: Coronary Stent Intervention;  Surgeon: Wellington Hampshire, MD;  Location: [place] [facility];  Service: Cardiovascular;  Laterality: N/A;  . CARDIOVERSION N/A 03/08/2017   Procedure: CARDIOVERSION;  Surgeon: Wellington Hampshire, MD;  Location: [facility] ORS;  Service: Cardiovascular;  Laterality: N/A;  . CATARACT EXTRACTION W/PHACO Right 01/04/2016   Procedure: CATARACT EXTRACTION PHACO AND INTRAOCULAR LENS PLACEMENT ([place]);  Surgeon: Leandrew Koyanagi, MD;  Location: [place];  Service: Ophthalmology;  Laterality: Right;  MALYUGIN  . CATARACT EXTRACTION W/PHACO Left 01/25/2016   Procedure: CATARACT EXTRACTION PHACO AND INTRAOCULAR LENS PLACEMENT ([place]) left eye;  Surgeon: Leandrew Koyanagi, MD;  Location: [place];  Service: Ophthalmology;  Laterality:  Left;  MALYUGIN SHUGARCAINE  . CHOLECYSTECTOMY  90's  . COLONOSCOPY  03/19/2020   Procedure: COLONOSCOPY;  Surgeon: Virgel Manifold, MD;  Location: [facility];  Service: Gastroenterology;;  . COLONOSCOPY WITH PROPOFOL N/A 05/03/2015   Procedure: COLONOSCOPY WITH PROPOFOL;  Surgeon: Lollie Sails, MD;  Location: [facility] ENDOSCOPY;  Service: Endoscopy;  Laterality: N/A;  . COLONOSCOPY WITH PROPOFOL N/A 03/19/2020   Procedure: COLONOSCOPY WITH PROPOFOL;  Surgeon: Virgel Manifold, MD;  Location: [facility] ENDOSCOPY;  Service: Endoscopy;  Laterality: N/A;  . CORONARY ARTERY BYPASS GRAFT  98   4 vessel  . CORONARY ARTERY BYPASS GRAFT     x 4 age 51 per pt   . CORONARY STENT INTERVENTION N/A 03/31/2018   Procedure: CORONARY STENT INTERVENTION;  Surgeon: Wellington Hampshire, MD;  Location: [place] [facility];  Service: Cardiovascular;  Laterality: N/A;  . ESOPHAGOGASTRODUODENOSCOPY N/A 03/22/2015   Procedure: ESOPHAGOGASTRODUODENOSCOPY (EGD);  Surgeon: Lollie Sails, MD;  Location: [facility] ENDOSCOPY;  Service: Endoscopy;  Laterality: N/A;  . ESOPHAGOGASTRODUODENOSCOPY N/A 03/19/2020   Procedure: ESOPHAGOGASTRODUODENOSCOPY (EGD);  Surgeon: Virgel Manifold, MD;  Location: [facility] END[facility]COPY;  Service: Endoscopy;  Laterality: N/A;  . HEMORRHOID BANDING    . HEMORRHOID SURGERY N/A 02/17/2018   Procedure: HEMORRHOIDECTOMY;  Surgeon: Robert Bellow, MD;  Location: [facility] ORS;  Service: General;  Laterality: N/A;  . JOINT REPLACEMENT     BILATERAL KNEE REPLACEMENTS  . KNEE ARTHROSCOPY W/ OATS PROCEDURE     Lt knee (9/01), Rt knee (3/11), Lt hip (5/10)  . LEFT HEART CATH AND CORONARY ANGIOGRAPHY N/A 03/31/2018   Procedure: LEFT HEART CATH AND CORONARY ANGIOGRAPHY;  Surgeon: Wellington Hampshire, MD;  Location: [place] [facility] LAB;  Service: Cardiovascular;  Laterality: N/A;  . TOTAL HIP ARTHROPLASTY     Family History  Problem Relation Age of Onset  . Thyroid disease Mother        graves disease   . Heart disease Father        rheumatic heart  . Alcohol abuse Father   . Depression Father   . Lung cancer Sister   . Depression Daughter   . Thyroid disease Daughter        hashimoto  . Depression Son   . Stroke Maternal Grandmother   . Depression Maternal Grandmother   . Hypertension Maternal Grandfather   . Hypertension Paternal Grandfather   . Seizures Son   . Breast cancer Neg Hx    Social History   Socioeconomic History  . Marital status: Divorced    Spouse name: Not on file  . Number of children: 3  . Years of education: Not  on file  . Highest education level: Not on file  Occupational History  . Occupation: Retired Sport and exercise psychologist  Tobacco Use  . Smoking status: Former Smoker    Types: Cigarettes  . Smokeless tobacco: Never Used  . Tobacco comment: quit 47+ yrs ago  Vaping Use  . Vaping Use: Never used  Substance and Sexual Activity  . Alcohol use: No    Alcohol/week: 0.0 standard drinks  . Drug use: No  . Sexual activity: Never  Other Topics Concern  . Not on file  Social History Narrative   Lives in [place].  Retired Engineer, drilling.  Relatively active.   Uses a Rollator to ambulate   Social Determinants of Health   Financial Resource Strain: Low Risk   . Difficulty of Paying Living Expenses: Not hard at all  Food Insecurity: No Food Insecurity  . Worried About Charity fundraiser in the Last Year: Never true  . Ran Out of Food in the Last Year: Never true  Transportation Needs: No Transportation Needs  . Lack of Transportation (Medical): No  . Lack of Transportation (Non-Medical): No  Physical Activity: Not on file  Stress: No Stress Concern Present  . Feeling of Stress : Not at all  Social Connections: Unknown  . Frequency of Communication with Friends and Family: More than three times a week  . Frequency of Social Gatherings with Friends and Family: Twice a week  . Attends Religious Services: Not on file  . Active Member of Clubs or  Organizations: Not on file  . Attends Archivist Meetings: Not on file  . Marital Status: Not on file  Intimate Partner Violence: Not on file   Current Meds  Medication Sig  . acetaminophen (TYLENOL) 500 MG tablet Take 1,000 mg by mouth 2 (two) times daily.  [name] amitriptyline (ELAVIL) 25 MG tablet TAKE 1 TABLET BY MOUTH AT BEDTIME  . b complex vitamins tablet Take 1 tablet by mouth daily.  . Cholecalciferol (VITAMIN D3) 2000 UNITS TABS Take 2,000 Units by mouth 2 (two) times a week. Tuesday and Saturdays.  . clotrimazole (MYCELEX) 10 MG troche Take 10 mg by mouth as needed.  . Coenzyme Q10 (COQ-10) 200 MG CAPS Take 200 mg by mouth daily.   [name] conjugated estrogens (PREMARIN) vaginal cream Apply 0.5mg  (pea-sized amount)  just inside the vaginal introitus with a finger-tip on  Monday, Wednesday and Friday nights. (Patient taking differently: Apply 0.5mg  (pea-sized amount)  just inside the vaginal introitus with a finger-tip on  Monday, Wednesday and Friday nights.)  . CRANBERRY PO Take 1 capsule by mouth daily.  [name] ELIQUIS 5 MG TABS tablet TAKE 1 TABLET BY MOUTH TWICE DAILY  . ezetimibe (ZETIA) 10 MG tablet TAKE 1 TABLET BY MOUTH DAILY  . furosemide (LASIX) 40 MG tablet Take 1 tablet (40 mg total) by mouth 2 (two) times daily. May give 20 mg at night if dehydrated instead of 40 mg  . hydrocortisone 2.5 % cream Apply to VULVA at bedtime as needed with miconazole for lichen planus  . loratadine (CLARITIN) 10 MG tablet Take 10 mg by mouth daily.  . meclizine (ANTIVERT) 25 MG tablet Take 1 tablet (25 mg total) by mouth 3 (three) times daily as needed for dizziness.  . Multiple Vitamins-Minerals (HAIR/SKIN/NAILS PO) Take 1 tablet by mouth daily.  . mupirocin ointment (BACTROBAN) 2 % Apply to affected area bid  . pantoprazole (PROTONIX) 40 MG tablet TAKE 1 TABLET BY MOUTH TWICE DAILY  .  polyethylene glycol powder (GLYCOLAX/MIRALAX) 17 GM/SCOOP powder Take 8.5 g by mouth daily. Hold if diarrhea   . PREVIDENT 5000 BOOSTER PLUS 1.1 % PSTE USE TID AFTER MEALS . DO NOT RINSE  . ramipril (ALTACE) 5 MG capsule TAKE 1 CAPSULE(5 MG) BY MOUTH DAILY  . REPATHA SURECLICK 226 MG/ML SOAJ INJECT 1 PEN EVERY 14 DAYS AS DIRECTED  . spironolactone (ALDACTONE) 25 MG tablet TAKE 1 TABLET BY MOUTH EVERY MORNING  . traMADol (ULTRAM) 50 MG tablet Take 1 tablet (50 mg total) by mouth 2 (two) times daily as needed.  . traZODone (DESYREL) 150 MG tablet TAKE 1 TO 1 & 1/3 TABLETS EACH NIGHT AT BEDTIME.  [name] triamcinolone cream (KENALOG) 0.1 % Apply topically 2 (two) times daily.  . verapamil (CALAN-SR) 180 MG CR tablet TAKE 1 TABLET(180 MG) BY MOUTH TWICE DAILY  . vitamin B-12 (CYANOCOBALAMIN) 1000 MCG tablet Take 1,000 mcg by mouth daily.  . vitamin C (ASCORBIC ACID) 500 MG tablet Take 500 mg by mouth at bedtime.   Allergies  Allergen Reactions  . Beta Adrenergic Blockers Shortness Of Breath    DEPRESSION/HYPOTENSION  . Brilinta [Ticagrelor] Shortness Of Breath    Caused AFIB  . Albuterol     rigors  . Antihistamines, Diphenhydramine-Type     Muscle spasms  . Aspirin Other (See Comments)    Heartburn   . Nsaids Other (See Comments)    ULCER HISTORY & CURRENTLY ON BLOOD THINNERS  . Penicillins Hives and Other (See Comments)    Has patient had a PCN reaction causing immediate rash, facial/tongue/throat swelling, SOB or lightheadedness with hypotension: No Has patient had a PCN reaction causing severe rash involving mucus membranes or skin necrosis: No Has patient had a PCN reaction that required hospitalization No Has patient had a PCN reaction occurring within the last 10 years: No If all of the above answers are "NO", then may proceed with Cephalosporin use.   . Statins Other (See Comments)    Muscle weakness  . Sulfa Antibiotics Rash    At mouth  . Sulfasalazine Rash    At mouth  . Tetracycline Rash  . Tetracyclines & Related Rash   No results found for this or any previous visit (from the  past 2160 hour(s)). Objective  Body mass index is 33.29 kg/m. Wt Readings from Last 3 Encounters:  01/10/21 182 lb (82.6 kg)  12/06/20 181 lb 2 oz (82.2 kg)  09/01/20 179 lb 14.3 oz (81.6 kg)   Temp Readings from Last 3 Encounters:  01/10/21 97.7 F (36.5 C)  09/01/20 98.2 F (36.8 C) (Oral)  07/04/20 98.2 F (36.8 C) (Oral)   BP Readings from Last 3 Encounters:  12/06/20 110/70  09/01/20 139/90  07/04/20 118/70   Pulse Readings from Last 3 Encounters:  12/06/20 64  09/01/20 84  07/04/20 68    Physical Exam Vitals and nursing note reviewed.  Constitutional:      Appearance: Normal appearance. She is well-developed and well-groomed. She is obese.  HENT:     Head: Normocephalic and atraumatic.  Cardiovascular:     Rate and Rhythm: Normal rate and regular rhythm.     Heart sounds: Normal heart sounds. No murmur heard.   Pulmonary:     Effort: Pulmonary effort is normal.     Breath sounds: Normal breath sounds.  Skin:    General: Skin is warm and dry.  Neurological:     General: No focal deficit present.     Mental Status:  She is alert and oriented to person, place, and time. Mental status is at baseline.     Gait: Gait abnormal.  Psychiatric:        Attention and Perception: Attention and perception normal.        Mood and Affect: Mood and affect normal.        Speech: Speech normal.        Behavior: Behavior normal. Behavior is cooperative.        Thought Content: Thought content normal.        Cognition and Memory: Cognition and memory normal.        Judgment: Judgment normal.     Assessment  Plan  Abnormal gait with dizziness r/o central vs BPPV vs other- Plan: Ambulatory referral to ENT Vertigo - Plan: meclizine (ANTIVERT) 25 MG tablet tid prn she reports she tried 12.5 mg tid prn w/o relief and wants to increase dose, MR Brain Wo Contrast, Ambulatory referral to ENT Dr. Tami Ribas pt interested in Epley maneuver  Will also check to see if [facility] PT  dept does Epley Maneuver she may have to f/u with ENT  If done at [facility] she wants to do there as well  Laryngitis, chronic and hoarseness with vocal cord paralysis at times dypshagia - Plan: Ambulatory referral to ENT Dr. Tami Ribas   Provider: Dr. Olivia Mackie McLean-Scocuzza-Internal Medicine

## 2021-01-10 NOTE — Patient Instructions (Addendum)
Will order MRI at [facility] of your [facility]  Physical therapy at [facility] or ENT Dr. Tami Ribas  How to Perform the Epley Maneuver The Epley maneuver is an exercise that relieves symptoms of vertigo. Vertigo is the feeling that you or your surroundings are moving when they are not. When you feel vertigo, you may feel like the room is spinning and may have trouble walking. The Epley maneuver is used for a type of vertigo caused by a calcium deposit in a part of the inner ear. The maneuver involves changing head positions to help the deposit move out of the area. You can do this maneuver at home whenever you have symptoms of vertigo. You can repeat it in 24 hours if your vertigo has not gone away. Even though the Epley maneuver may relieve your vertigo for a few weeks, it is possible that your symptoms will return. This maneuver relieves vertigo, but it does not relieve dizziness. What are the risks? If it is done correctly, the Epley maneuver is considered safe. Sometimes it can lead to dizziness or nausea that goes away after a short time. If you develop other symptoms--such as changes in vision, weakness, or numbness--stop doing the maneuver and call your health care provider. Supplies needed:  A bed or table.  A pillow. How to do the Epley maneuver 1. Sit on the edge of a bed or table with your back straight and your legs extended or hanging over the edge of the bed or table. 2. Turn your head halfway toward the affected ear or side as told by your health care provider. 3. Lie backward quickly with your head turned until you are lying flat on your back. You may want to position a pillow under your shoulders. 4. Hold this position for at least 30 seconds. If you feel dizzy or have symptoms of vertigo, continue to hold the position until the symptoms stop. 5. Turn your head to the opposite direction until your unaffected ear is facing the floor. 6. Hold this position for at least 30  seconds. If you feel dizzy or have symptoms of vertigo, continue to hold the position until the symptoms stop. 7. Turn your whole body to the same side as your head so that you are positioned on your side. Your head will now be nearly facedown. Hold for at least 30 seconds. If you feel dizzy or have symptoms of vertigo, continue to hold the position until the symptoms stop. 8. Sit back up. You can repeat the maneuver in 24 hours if your vertigo does not go away.      Follow these instructions at home: For 24 hours after doing the Epley maneuver:  Keep your head in an upright position.  When lying down to sleep or rest, keep your head raised (elevated) with two or more pillows.  Avoid excessive neck movements. Activity  Do not drive or use machinery if you feel dizzy.  After doing the Epley maneuver, return to your normal activities as told by your health care provider. Ask your health care provider what activities are safe for you. General instructions  Drink enough fluid to keep your urine pale yellow.  Do not drink alcohol.  Take over-the-counter and prescription medicines only as told by your health care provider.  Keep all follow-up visits as told by your health care provider. This is important. Preventing vertigo symptoms Ask your health care provider if there is anything you should do at home to prevent  vertigo. He or she may recommend that you:  Keep your head elevated with two or more pillows while you sleep.  Do not sleep on the side of your affected ear.  Get up slowly from bed.  Avoid sudden movements during the day.  Avoid extreme head positions or movement, such as looking up or bending over. Contact a health care provider if:  Your vertigo gets worse.  You have other symptoms, including: ? Nausea. ? Vomiting. ? Headache. Get help right away if you:  Have vision changes.  Have a headache or neck pain that is severe or getting worse.  Cannot stop  vomiting.  Have new numbness or weakness in any part of your body. Summary  Vertigo is the feeling that you or your surroundings are moving when they are not.  The Epley maneuver is an exercise that relieves symptoms of vertigo.  If the Epley maneuver is done correctly, it is considered safe and relieves vertigo quickly. This information is not intended to replace advice given to you by your health care provider. Make sure you discuss any questions you have with your health care provider. Document Revised: 07/22/2019 Document Reviewed: 07/22/2019 Elsevier Patient Education  2021 [employer].  Vertigo Vertigo is the feeling that you or your surroundings are moving when they are not. This feeling can come and go at any time. Vertigo often goes away on its own. Vertigo can be dangerous if it occurs while you are doing something that could endanger you or others, such as driving or operating machinery. Your health care provider will do tests to determine the cause of your vertigo. Tests will also help your health care provider decide how best to treat your condition. Follow these instructions at home: Eating and drinking  Drink enough fluid to keep your urine pale yellow.  Do not drink alcohol.      Activity  Return to your normal activities as told by your health care provider. Ask your health care provider what activities are safe for you.  In the morning, first sit up on the side of the bed. When you feel okay, stand slowly while you hold onto something until you know that your balance is fine.  Move slowly. Avoid sudden body or head movements or certain positions, as told by your health care provider.  If you have trouble walking or keeping your balance, try using a cane for stability. If you feel dizzy or unstable, sit down right away.  Avoid doing any tasks that would cause danger to you or others if vertigo occurs.  Avoid bending down if you feel dizzy. Place items in your  home so that they are easy for you to reach without leaning over.  Do not drive or use heavy machinery if you feel dizzy. General instructions  Take over-the-counter and prescription medicines only as told by your health care provider.  Keep all follow-up visits as told by your health care provider. This is important. Contact a health care provider if:  Your medicines do not relieve your vertigo or they make it worse.  You have a fever.  Your condition gets worse or you develop new symptoms.  Your family or friends notice any behavioral changes.  Your nausea or vomiting gets worse.  You have numbness or a prickling and tingling sensation in part of your body. Get help right away if you:  Have difficulty moving or speaking.  Are always dizzy.  Faint.  Develop severe headaches.  Have weakness  in your hands, arms, or legs.  Have changes in your hearing or vision.  Develop a stiff neck.  Develop sensitivity to light. Summary  Vertigo is the feeling that you or your surroundings are moving when they are not.  Your health care provider will do tests to determine the cause of your vertigo.  Follow instructions for home care. You may be told to avoid certain tasks, positions, or movements.  Contact a health care provider if your medicines do not relieve your symptoms, or if you have a fever, nausea, vomiting, or changes in behavior.  Get help right away if you have severe headaches or difficulty speaking, or you develop hearing or vision problems. This information is not intended to replace advice given to you by your health care provider. Make sure you discuss any questions you have with your health care provider. Document Revised: 08/18/2018 Document Reviewed: 08/18/2018 Elsevier Patient Education  2021 [place].  Laryngitis  Laryngitis is inflammation of the vocal cords that causes symptoms such as hoarseness or loss of voice. The vocal cords are two bands of  muscles in your throat. When you speak, these cords come together and vibrate. The vibrations come out through your mouth as sound. When your vocal cords are inflamed, your voice sounds different. Laryngitis can be temporary (acute) or long-term (chronic). Most cases of acute laryngitis improve with time. Chronic laryngitis is laryngitis that lasts for more than 3 weeks. What are the causes? Acute laryngitis may be caused by:  A viral infection.  Lots of talking, yelling, or singing. This is also called vocal strain.  A bacterial infection. Chronic laryngitis may be caused by:  Vocal strain.  Injury to your vocal cords.  Acid reflux (gastroesophageal reflux disease, or GERD).  Allergies.  A sinus infection.  Smoking.  Alcohol abuse.  Breathing in chemicals or dust.  Growths on the vocal cords. What increases the risk? The following factors may make you more likely to develop this condition:  Smoking.  Alcohol abuse.  Having allergies.  Chronic irritants in the workplace, such as toxic fumes. What are the signs or symptoms? Symptoms of this condition may include:  Low, hoarse voice.  Loss of voice.  Dry cough.  Sore or dry throat.  Stuffy nose. How is this diagnosed? This condition may be diagnosed based on:  Your symptoms and a physical exam.  Throat culture.  Blood test.  A procedure in which your health care provider looks at your vocal cords with a mirror or viewing tube (laryngoscopy). How is this treated? Treatment for laryngitis depends on what is causing it.  Usually, treatment involves resting your voice and using medicines to soothe your throat.  If your laryngitis is caused by a bacterial infection, you may need to take antibiotic medicine.  If your laryngitis is caused by a growth, you may need to have a procedure to remove it. Follow these instructions at home: Medicines  Take over-the-counter and prescription medicines only as told  by your health care provider.  If you were prescribed an antibiotic medicine, take it as told by your health care provider. Do not stop taking the antibiotic even if you start to feel better. General instructions  Talk as little as possible. Also avoid whispering, which can cause vocal strain.  Write instead of talking. Do this until your voice is back to normal.  Drink enough fluid to keep your urine pale yellow.  Breathe in moist air. Use a humidifier if you live  in a dry climate.  Do not use any products that contain nicotine or tobacco, such as cigarettes and e-cigarettes. If you need help quitting, ask your health care provider. Contact a health care provider if:  You have a fever.  You have increasing pain.  Your symptoms do not get better in 2 weeks. Get help right away if:  You cough up blood.  You have difficulty swallowing.  You have trouble breathing. Summary  Laryngitis is inflammation of the vocal cords that causes symptoms such as hoarseness or loss of voice.  Laryngitis can be temporary (acute) or long-term (chronic).  Treatment for laryngitis depends on the cause. It often involves resting your voice and using medicine to soothe your throat. This information is not intended to replace advice given to you by your health care provider. Make sure you discuss any questions you have with your health care provider. Document Revised: 09/11/2017 Document Reviewed: 09/11/2017 Elsevier Patient Education  2021 [employer].

## 2021-01-11 ENCOUNTER — Telehealth: Payer: Self-pay

## 2021-01-11 NOTE — Telephone Encounter (Signed)
Faxed PT rx to [facility] @ [phone] on 01/10/21. 2-3x a week for 6-8 weeks

## 2021-01-16 DIAGNOSIS — M6281 Muscle weakness (generalized): Secondary | ICD-10-CM | POA: Diagnosis not present

## 2021-01-16 DIAGNOSIS — W19XXXD Unspecified fall, subsequent encounter: Secondary | ICD-10-CM | POA: Diagnosis not present

## 2021-01-16 DIAGNOSIS — R269 Unspecified abnormalities of gait and mobility: Secondary | ICD-10-CM | POA: Diagnosis not present

## 2021-01-16 DIAGNOSIS — R293 Abnormal posture: Secondary | ICD-10-CM | POA: Diagnosis not present

## 2021-01-16 DIAGNOSIS — R42 Dizziness and giddiness: Secondary | ICD-10-CM | POA: Diagnosis not present

## 2021-01-16 DIAGNOSIS — R2689 Other abnormalities of gait and mobility: Secondary | ICD-10-CM | POA: Diagnosis not present

## 2021-01-17 ENCOUNTER — Telehealth: Payer: Self-pay

## 2021-01-17 ENCOUNTER — Encounter: Payer: Self-pay | Admitting: Internal Medicine

## 2021-01-17 ENCOUNTER — Telehealth: Payer: Self-pay | Admitting: Internal Medicine

## 2021-01-17 DIAGNOSIS — J3800 Paralysis of vocal cords and larynx, unspecified: Secondary | ICD-10-CM | POA: Insufficient documentation

## 2021-01-17 DIAGNOSIS — R490 Dysphonia: Secondary | ICD-10-CM | POA: Insufficient documentation

## 2021-01-17 NOTE — Telephone Encounter (Signed)
pt interested in Epley maneuver  Can you fax or call [facility] to see if PT dept does Epley Maneuver she may have to f/u with ENT  If done at [facility] she wants to do there as well so Rx will need to be sent over I think I gave her one at the visit as well   Below is fax or phone # for [facility] please call and disc epley maneuver with PT [phone] Do they do this there?   Thank you Dr. Olivia Mackie McLean-scocuzza

## 2021-01-17 NOTE — Telephone Encounter (Signed)
Called pt to tell her that per Dr Granjeno f/u 3 weeks in person with PCP. Pt states that the f/u is not needed at this time. FYI

## 2021-01-18 NOTE — Telephone Encounter (Signed)
Called [facility] and received voicemail. Left detailed message to call back with response to procedure inquiry

## 2021-01-19 NOTE — Telephone Encounter (Signed)
[facility] called back returning your call

## 2021-01-19 NOTE — Telephone Encounter (Signed)
Left message to return call. When calling goes straight to voicemail. Please get direct number if I am unavailable to take call when calling back in.

## 2021-01-20 ENCOUNTER — Telehealth: Payer: Self-pay

## 2021-01-20 NOTE — Telephone Encounter (Addendum)
Received fax from [facility] stating Repatha has been approved until 01/20/2022.  Member # [id number]  Call [phone] (Customer Service Line) with any questions.

## 2021-01-20 NOTE — Telephone Encounter (Signed)
Per fax received from [email or web address], Repatha 140mg /mL requires Prior Authorization. Clinical questions completed and sent to plan.  RESPONSE: Charlotte Wagner (Key: [id number])  Your information has been submitted to [place]. [employer] will review the request and notify you of the determination decision directly, typically within 3 business days of your submission and once all necessary information is received.  You will also receive your request decision electronically. To check for an update later, open the request again from your dashboard.  If [employer] has not responded within the specified timeframe or if you have any questions about your PA submission, contact [place] directly at [facility]) [phone] or ([place]) [phone].

## 2021-01-24 ENCOUNTER — Ambulatory Visit
Admission: RE | Admit: 2021-01-24 | Discharge: 2021-01-24 | Disposition: A | Discharge status: Home / Self Care | Payer: Medicare Other | Source: Ambulatory Visit | Attending: Internal Medicine | Admitting: Internal Medicine

## 2021-01-24 ENCOUNTER — Other Ambulatory Visit: Payer: Self-pay

## 2021-01-24 DIAGNOSIS — R42 Dizziness and giddiness: Secondary | ICD-10-CM | POA: Insufficient documentation

## 2021-01-24 DIAGNOSIS — R531 Weakness: Secondary | ICD-10-CM | POA: Diagnosis not present

## 2021-01-24 DIAGNOSIS — R29898 Other symptoms and signs involving the musculoskeletal system: Secondary | ICD-10-CM | POA: Diagnosis not present

## 2021-01-25 ENCOUNTER — Telehealth: Payer: Self-pay | Admitting: Internal Medicine

## 2021-01-25 NOTE — Telephone Encounter (Signed)
Patient was returning call about her scan  She wanted a call back on this number [phone]

## 2021-01-26 NOTE — Telephone Encounter (Signed)
See result note.  

## 2021-02-02 ENCOUNTER — Telehealth: Payer: Self-pay | Admitting: Internal Medicine

## 2021-02-02 ENCOUNTER — Other Ambulatory Visit: Payer: Self-pay | Admitting: Cardiovascular Disease

## 2021-02-02 NOTE — Telephone Encounter (Signed)
Patient called in wanted Dr.Scott to send in a order to [facility] to [place] for a clean catch for urine sample for

## 2021-02-02 NOTE — Telephone Encounter (Signed)
PT called in wanting to request a order for a urine test to be sent in. Would like for it to be fax over to the nurse at [phone].

## 2021-02-02 NOTE — Telephone Encounter (Signed)
LMTCB

## 2021-02-02 NOTE — Telephone Encounter (Signed)
Have not received order from [facility] but patient is wanting to collect urine for frequency. Pt is going to have [facility] refax order in the morning. Confirmed she is not having any other symptoms at this time.

## 2021-02-03 ENCOUNTER — Other Ambulatory Visit: Payer: Self-pay

## 2021-02-03 DIAGNOSIS — R350 Frequency of micturition: Secondary | ICD-10-CM | POA: Diagnosis not present

## 2021-02-03 DIAGNOSIS — R339 Retention of urine, unspecified: Secondary | ICD-10-CM | POA: Diagnosis not present

## 2021-02-03 MED ORDER — RAMIPRIL 5 MG PO CAPS: ORAL_CAPSULE | ORAL | 3 refills | Status: DC

## 2021-02-03 NOTE — Telephone Encounter (Signed)
Gave STAT orders to do urine and culture. Urine will go to [facility]. Orders printed for signature

## 2021-02-03 NOTE — Telephone Encounter (Signed)
Order faxed to [facility]

## 2021-02-03 NOTE — Telephone Encounter (Signed)
Do we need to fax order?  See if they can go ahead and collect urine.  Where are they going to send the urine - with weekend coming - now sure how we will get results.

## 2021-02-24 DIAGNOSIS — Z23 Encounter for immunization: Secondary | ICD-10-CM | POA: Diagnosis not present

## 2021-03-02 ENCOUNTER — Encounter: Payer: Self-pay | Admitting: Cardiovascular Disease

## 2021-03-02 DIAGNOSIS — J45909 Unspecified asthma, uncomplicated: Secondary | ICD-10-CM | POA: Diagnosis not present

## 2021-03-02 DIAGNOSIS — N189 Chronic kidney disease, unspecified: Secondary | ICD-10-CM | POA: Diagnosis not present

## 2021-03-02 DIAGNOSIS — I739 Peripheral vascular disease, unspecified: Secondary | ICD-10-CM | POA: Diagnosis not present

## 2021-03-02 DIAGNOSIS — I259 Chronic ischemic heart disease, unspecified: Secondary | ICD-10-CM | POA: Diagnosis not present

## 2021-03-02 DIAGNOSIS — I10 Essential (primary) hypertension: Secondary | ICD-10-CM | POA: Diagnosis not present

## 2021-03-02 LAB — HEPATIC FUNCTION PANEL
ALT: 14 (ref 7–35)
AST: 18 (ref 13–35)
Alkaline Phosphatase: 48 (ref 25–125)
Bilirubin, Direct: 0.1 (ref 0.01–0.4)
Bilirubin, Total: 0.6

## 2021-03-02 LAB — COMPREHENSIVE METABOLIC PANEL
Albumin: 4.2 (ref 3.5–5.0)
Calcium: 9.8 (ref 8.7–10.7)
Calcium: 9.8 (ref 8.7–10.7)
GFR calc Af Amer: 46
GFR calc non Af Amer: 39
Globulin: 2.3

## 2021-03-02 LAB — BASIC METABOLIC PANEL
BUN: 33 — AB (ref 4–21)
BUN: 33 — AB (ref 4–21)
CO2: 30 — AB (ref 13–22)
CO2: 30 — AB (ref 13–22)
Chloride: 98 — AB (ref 99–108)
Chloride: 98 — AB (ref 99–108)
Creatinine: 1.3 — AB (ref 0.5–1.1)
Creatinine: 1.3 — AB (ref 0.5–1.1)
Glucose: 122
Potassium: 4.1 (ref 3.4–5.3)
Potassium: 4.1 (ref 3.4–5.3)
Sodium: 136 — AB (ref 137–147)
Sodium: 136 — AB (ref 137–147)

## 2021-03-02 LAB — IRON,TIBC AND FERRITIN PANEL
%SAT: 6
Ferritin: 11
Iron: 28
TIBC: 459

## 2021-03-02 LAB — TSH: TSH: 1.56 (ref 0.41–5.90)

## 2021-03-02 LAB — LIPID PANEL
Cholesterol: 146 (ref 0–200)
HDL: 68 (ref 35–70)
LDL Cholesterol: 63
Triglycerides: 71 (ref 40–160)

## 2021-03-02 LAB — CBC AND DIFFERENTIAL
HCT: 31 — AB (ref 36–46)
Hemoglobin: 9.6 — AB (ref 12.0–16.0)
Neutrophils Absolute: 2023
Platelets: 262 (ref 150–399)
WBC: 3.6

## 2021-03-02 LAB — CBC: RBC: 3.61 — AB (ref 3.87–5.11)

## 2021-03-07 ENCOUNTER — Encounter: Payer: Self-pay | Admitting: Cardiovascular Disease

## 2021-03-08 ENCOUNTER — Telehealth: Payer: Self-pay

## 2021-03-08 NOTE — Telephone Encounter (Signed)
Labs received from [facility]. Given to Dr Nicki Reaper and have already been abstracted.

## 2021-03-08 NOTE — Telephone Encounter (Signed)
Reviewed labs.  Cholesterol levels ok.  Kidney function decreased from previous check.  Need to confirm staying hydrated.  Hgb decreased and iron stores low.  Confirm no blood in stool or bleeding.  Would like to start iron if not taking.  Start ferrous sulfate 325mg q day.  Recheck cbc and ferritin in 3-4 weeks.  Will need order sent to [facility].  Liver function tests are wnl.  Will need f/u met b in 3-4 weeks as well.   

## 2021-03-08 NOTE — Progress Notes (Signed)
Labs received from [facility]. Placed in results folder and have already been abstracted.

## 2021-03-09 ENCOUNTER — Encounter: Payer: Self-pay | Admitting: Physician Assistant

## 2021-03-09 ENCOUNTER — Other Ambulatory Visit: Payer: Self-pay

## 2021-03-09 ENCOUNTER — Ambulatory Visit (INDEPENDENT_AMBULATORY_CARE_PROVIDER_SITE_OTHER): Payer: Medicare Other | Admitting: Physician Assistant

## 2021-03-09 VITALS — BP 120/70 | HR 86 | Ht 62.0 in | Wt 184.0 lb

## 2021-03-09 DIAGNOSIS — I5042 Chronic combined systolic (congestive) and diastolic (congestive) heart failure: Secondary | ICD-10-CM

## 2021-03-09 DIAGNOSIS — I4819 Other persistent atrial fibrillation: Secondary | ICD-10-CM | POA: Diagnosis not present

## 2021-03-09 DIAGNOSIS — R490 Dysphonia: Secondary | ICD-10-CM

## 2021-03-09 DIAGNOSIS — E785 Hyperlipidemia, unspecified: Secondary | ICD-10-CM

## 2021-03-09 DIAGNOSIS — Z789 Other specified health status: Secondary | ICD-10-CM

## 2021-03-09 DIAGNOSIS — I272 Pulmonary hypertension, unspecified: Secondary | ICD-10-CM

## 2021-03-09 DIAGNOSIS — I10 Essential (primary) hypertension: Secondary | ICD-10-CM | POA: Diagnosis not present

## 2021-03-09 DIAGNOSIS — I071 Rheumatic tricuspid insufficiency: Secondary | ICD-10-CM | POA: Diagnosis not present

## 2021-03-09 DIAGNOSIS — I25118 Atherosclerotic heart disease of native coronary artery with other forms of angina pectoris: Secondary | ICD-10-CM | POA: Diagnosis not present

## 2021-03-09 DIAGNOSIS — I059 Rheumatic mitral valve disease, unspecified: Secondary | ICD-10-CM | POA: Diagnosis not present

## 2021-03-09 DIAGNOSIS — I5032 Chronic diastolic (congestive) heart failure: Secondary | ICD-10-CM | POA: Diagnosis not present

## 2021-03-09 MED ORDER — IRON (FERROUS SULFATE) 325 (65 FE) MG PO TABS: 325.0000 mg | ORAL_TABLET | Freq: Every day | ORAL | 11 refills | Status: DC

## 2021-03-09 MED ORDER — FUROSEMIDE 40 MG PO TABS: 40.0000 mg | ORAL_TABLET | Freq: Every day | ORAL | 1 refills | Status: DC

## 2021-03-09 NOTE — Telephone Encounter (Signed)
Patient aware of results and has appt with cardiology this afternoon. They are following her kidney function since she is on lasix. Will start ferrous sulfate 325 mg q day. She is going to have labs done in 3-4 weeks at [facility].

## 2021-03-09 NOTE — Progress Notes (Signed)
Office Visit    Patient Name: Charlotte Sessions, MD Date of Encounter: 03/09/2021  PCP:  [name], [place]  Cardiologist:  Ida Rogue, MD  Advanced Practice Provider:  No care team member to display Electrophysiologist:  None  :[phone]}   Chief Complaint    Chief Complaint  Patient presents with  . OTHER    Patient c/o . Medications verbally reviewed with patient.     85 y.o. female with history of CAD s/p four-vessel CABG in 1998 with LIMA to LAD, SVG to RCA s/p PCI/DES 05/2016, SVG to OM, and SVG to diagonal s/p stenting, HFrEF secondary to ischemic cardiomyopathy, permanent atrial fibrillation accompanied by bradycardia s/p unsuccessful cardioversion with beta-blocker intolerance diagnosed 10/2016, pulmonary hypertension, hyperlipidemia with statin intolerance on Repatha, hypertension, and multiple other comorbid's as detailed in her history/EMR.  Past Medical History    Past Medical History:  Diagnosis Date  . Allergy   . Arthritis    s/p bilateral knees and left hip replacement  . Asthma   . CAD (coronary artery disease)    a. 12/1996 s/p CABG x4 ([place]);  b. 2005 Pt reports stress test & cath, which revealed patent grafts.  . Cancer ([place])    melanoma right arm  . Carotid arterial disease ([place])    a. 04/2015 Carotid U/S: <50% bilat ICA stenosis.  . Chronic kidney disease   . Colon polyps    H/O  . Depression   . GERD (gastroesophageal reflux disease)    h/o hiatal hernia  . Headache    migraines in past  . Heart murmur    a. 04/2011 Echo: EF 55-60%, bilat atrial enlargement, mild to mod TR.  [name] History of chicken pox   . History of hiatal hernia   . Hx of migraines    rare now  . Hx: UTI (urinary tract infection)   . Hyperlipidemia    a. Statin intolerant -->on zetia.  . Hypertension   . Hypertensive heart disease   . Lichen planus   . Melanoma ([place])   . Palpitations    a. rare PVC's and h/o SVT.  [name] PMR  (polymyalgia rheumatica) (H[facility])    h/o in setting of crestor usage.  [name] PSVT (paroxysmal supraventricular tachycardia) ([place])   . PUD (peptic ulcer disease)    remote history  . Raynaud's phenomenon   . Spinal stenosis   . Urine incontinence    H/O  . Vitamin D deficiency    Past Surgical History:  Procedure Laterality Date  . ADENOIDECTOMY     age 68  . BACK SURGERY     L3-L5  . BILATERAL CARPAL TUNNEL RELEASE    . BREAST BIOPSY Right    bx x 3 neg  . BREAST SURGERY Right    biopsy x 3 (all benign)  . CARDIAC CATHETERIZATION N/A 06/01/2016   Procedure: LEFT HEART CATH AND CORS/GRAFTS ANGIOGRAPHY;  Surgeon: Wellington Hampshire, MD;  Location: [facility];  Service: Cardiovascular;  Laterality: N/A;  . CARDIAC CATHETERIZATION N/A 06/01/2016   Procedure: Coronary Stent Intervention;  Surgeon: Wellington Hampshire, MD;  Location: [facility];  Service: Cardiovascular;  Laterality: N/A;  . CARDIOVERSION N/A 03/08/2017   Procedure: CARDIOVERSION;  Surgeon: Wellington Hampshire, MD;  Location: [facility];  Service: Cardiovascular;  Laterality: N/A;  . CATARACT EXTRACTION W/PHACO Right 01/04/2016   Procedure: CATARACT EXTRACTION PHACO AND INTRAOCULAR LENS PLACEMENT ([place]);  Surgeon: Leandrew Koyanagi, MD;  Location: [facility];  Service: Ophthalmology;  Laterality: Right;  MALYUGIN  . CATARACT EXTRACTION W/PHACO Left 01/25/2016   Procedure: CATARACT EXTRACTION PHACO AND INTRAOCULAR LENS PLACEMENT ([place]) left eye;  Surgeon: Leandrew Koyanagi, MD;  Location: [facility];  Service: Ophthalmology;  Laterality: Left;  MALYUGIN SHUGARCAINE  . CHOLECYSTECTOMY  90's  . COLONOSCOPY  03/19/2020   Procedure: COLONOSCOPY;  Surgeon: Virgel Manifold, MD;  Location: [facility] ENDOSCOPY;  Service: Gastroenterology;;  . COLONOSCOPY WITH PROPOFOL N/A 05/03/2015   Procedure: COLONOSCOPY WITH PROPOFOL;  Surgeon: Lollie Sails, MD;  Location: [facility] ENDOSCOPY;  Service: Endoscopy;  Laterality:  N/A;  . COLONOSCOPY WITH PROPOFOL N/A 03/19/2020   Procedure: COLONOSCOPY WITH PROPOFOL;  Surgeon: Virgel Manifold, MD;  Location: [facility] ENDOSCOPY;  Service: Endoscopy;  Laterality: N/A;  . CORONARY ARTERY BYPASS GRAFT  98   4 vessel  . CORONARY ARTERY BYPASS GRAFT     x 4 age 60 per pt   . CORONARY STENT INTERVENTION N/A 03/31/2018   Procedure: CORONARY STENT INTERVENTION;  Surgeon: Wellington Hampshire, MD;  Location: [place] [facility];  Service: Cardiovascular;  Laterality: N/A;  . ESOPHAGOGASTRODUODENOSCOPY N/A 03/22/2015   Procedure: ESOPHAGOGASTRODUODENOSCOPY (EGD);  Surgeon: Lollie Sails, MD;  Location: [facility] ENDOSCOPY;  Service: Endoscopy;  Laterality: N/A;  . ESOPHAGOGASTRODUODENOSCOPY N/A 03/19/2020   Procedure: ESOPHAGOGASTRODUODENOSCOPY (EGD);  Surgeon: Virgel Manifold, MD;  Location: [facility] ENDOSCOPY;  Service: Endoscopy;  Laterality: N/A;  . HEMORRHOID BANDING    . HEMORRHOID SURGERY N/A 02/17/2018   Procedure: HEMORRHOIDECTOMY;  Surgeon: Robert Bellow, MD;  Location: [facility];  Service: General;  Laterality: N/A;  . JOINT REPLACEMENT     BILATERAL KNEE REPLACEMENTS  . KNEE ARTHROSCOPY W/ OATS PROCEDURE     Lt knee (9/01), Rt knee (3/11), Lt hip (5/10)  . LEFT HEART CATH AND CORONARY ANGIOGRAPHY N/A 03/31/2018   Procedure: LEFT HEART CATH AND CORONARY ANGIOGRAPHY;  Surgeon: Wellington Hampshire, MD;  Location: [place] [facility];  Service: Cardiovascular;  Laterality: N/A;  . TOTAL HIP ARTHROPLASTY      Allergies  Allergies  Allergen Reactions  . Beta Adrenergic Blockers Shortness Of Breath    DEPRESSION/HYPOTENSION  . Brilinta [Ticagrelor] Shortness Of Breath    Caused AFIB  . Albuterol     rigors  . Antihistamines, Diphenhydramine-Type     Muscle spasms  . Aspirin Other (See Comments)    Heartburn   . Nsaids Other (See Comments)    ULCER HISTORY & CURRENTLY ON BLOOD THINNERS  . Penicillins Hives and Other (See Comments)    Has patient had a PCN  reaction causing immediate rash, facial/tongue/throat swelling, SOB or lightheadedness with hypotension: No Has patient had a PCN reaction causing severe rash involving mucus membranes or skin necrosis: No Has patient had a PCN reaction that required hospitalization No Has patient had a PCN reaction occurring within the last 10 years: No If all of the above answers are "NO", then may proceed with Cephalosporin use.   . Statins Other (See Comments)    Muscle weakness  . Sulfa Antibiotics Rash    At mouth  . Sulfasalazine Rash    At mouth  . Tetracycline Rash  . 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  She was previously followed by Dr. Yvone Neu and subsequently established with Dr. Rockey Situ.  Due to larynx paralysis, she is unable to speak very loudly.  LHC 05/2014 with severe underlying 3V CAD with patent grafts including LIMA to LAD, SVG to diagonal, SVG to left circumflex, SVG to RCA.  Severe 90% stenosis was noted SVG to RCA, felt to be the culprit for the patient's unstable angina.  She underwent PCI/DES to the SVG to RCA including distal protection device.   During admission 10/2016 for newly diagnosed A. fib, echo showed EF 60 to 65%, NR WMA, mild MR, moderate LAE, dilated RV, mild reduction of RV SF, PASP 49 mmHg.    She underwent successful cardioversion 03/2017 with recurrent atrial fibrillation 7 days later.  She was subsequently admitted with transient global amnesia with work-up unrevealing including EEG, head CT, MRI brain, and carotid artery ultrasound demonstrating moderate atherosclerotic plaque bilaterally and normal antegrade flow within the bilateral vertebral arteries.  03/2018 LHC with significant underlying 3V CAD with patent grafts and LIMA to LAD, SVG to RCA, SVG to OM, SVG to diagonal.  The stent in the SVG to RCA was patent.  There was severe new stenosis in the SVG to diagonal, felt to be her  culprit lesion.  She underwent direct stenting to the SVG to diagonal.  Echo showed new cardiomyopathy with EF 30 to 35%, hypokinesis of the anteroseptal, anterior, and apical myocardium with basal wall motion best preserved, mild MR, mildly to moderately dilated left atrium, moderately dilated RV cavity size and moderately reduced RV systolic function, mildly dilated right atrium, PFO, moderate to severe TR, PASP 58 mmHg, atrial fibrillation.  She was last seen by her primary cardiologist 12/06/2020.  To [facility] assisted care.  Her legs were weak, reportedly ambulating with a walker.  She reported oxygen saturations dropping to low 90s with exertion and high 90s at rest.  She did not feel that she needed oxygen.  She was on Lasix 40 mg daily and spironolactone 25 mg daily.  She continued to report significant bilateral lower extremity swelling.  Her most recent echo was reviewed in detail.  EKG also reviewed to show atrial fibrillation with ventricular rate 71 bpm.  Today, 03/09/2021, she returns to clinic and notes her main concern is regarding her ongoing diuresis on her Lasix.  She reports concern regarding her most recent labs and brings them with her today and reviewed them in detail.  She is concerned regarding her kidney function.  She reports sleeping sitting up, though notes this is also due to spinal stenosis.  She reports that she is not sleeping sitting up due to her breathing status.  If rushing around, she feels short of breath.  No shortness of breath at rest.  She reports that each day at approximately 4 PM she feels poorly.  She reports chronic lower extremity edema with left side greater than that of right.  She reports taking Lasix at 7:30 AM and 12 PM.  She reports needing trazodone for sleep.  No current chest pain.  No recent presyncope or syncope.  No other reported signs or symptoms of volume overload.  Reds vest today 29% not consistent with volume overload.  She reports her main concern  is ongoing Lasix 40 mg twice daily and request to go down to Lasix 40 mg daily.  Home Medications   Current Outpatient Medications  Medication Instructions  . acetaminophen (TYLENOL) 1,000 mg, Oral, 2 times daily  . amitriptyline (ELAVIL) 25 MG tablet TAKE 1 TABLET BY MOUTH AT BEDTIME  . b complex  vitamins tablet 1 tablet, Oral, Daily  . clotrimazole (MYCELEX) 10 mg, Oral, As needed  . conjugated estrogens (PREMARIN) vaginal cream Apply 0.5mg  (pea-sized amount)  just inside the vaginal introitus with a finger-tip on  Monday, Wednesday and Friday nights.  . CoQ-10 200 mg, Oral, Daily  . CRANBERRY PO 1 capsule, Oral, Daily  . ELIQUIS 5 MG TABS tablet TAKE 1 TABLET BY MOUTH TWICE DAILY  . ezetimibe (ZETIA) 10 MG tablet TAKE 1 TABLET BY MOUTH DAILY  . furosemide (LASIX) 40 mg, Oral, 2 times daily, May give 20 mg at night if dehydrated instead of 40 mg  . hydrocortisone 2.5 % cream Apply to VULVA at bedtime as needed with miconazole for lichen planus  . Iron (Ferrous Sulfate) 325 mg, Oral, Daily  . loratadine (CLARITIN) 10 mg, Oral, Daily  . meclizine (ANTIVERT) 25 mg, Oral, 3 times daily PRN  . Multiple Vitamins-Minerals (HAIR/SKIN/NAILS PO) 1 tablet, Oral, Daily  . mupirocin ointment (BACTROBAN) 2 % Apply to affected area bid  . pantoprazole (PROTONIX) 40 MG tablet TAKE 1 TABLET BY MOUTH TWICE DAILY  . polyethylene glycol powder (GLYCOLAX/MIRALAX) 8.5 g, Oral, Daily, Hold if diarrhea  . ramipril (ALTACE) 5 MG capsule TAKE 1 CAPSULE(5 MG) BY MOUTH DAILY  . REPATHA SURECLICK 643 MG/ML SOAJ INJECT 1 PEN EVERY 14 DAYS AS DIRECTED  . spironolactone (ALDACTONE) 25 MG tablet TAKE 1 TABLET BY MOUTH EVERY MORNING  . traMADol (ULTRAM) 50 mg, Oral, 2 times daily PRN  . traZODone (DESYREL) 150 MG tablet TAKE 1 TO 1 & 1/3 TABLETS EACH NIGHT AT BEDTIME.  [name] triamcinolone cream (KENALOG) 0.1 % Topical, 2 times daily  . verapamil (CALAN-SR) 180 MG CR tablet TAKE ONE (1) TABLET BY MOUTH TWO TIMES PER DAY   . vitamin B-12 (CYANOCOBALAMIN) 1,000 mcg, Oral, Daily  . vitamin C (ASCORBIC ACID) 500 mg, Oral, Daily at bedtime  . Vitamin D3 2,000 Units, Oral, 2 times weekly, Tuesday and Saturdays.     Review of Systems    She denies chest pain, palpitations, pnd, orthopnea, n, v, dizziness, syncope, weight gain, or early satiety.  She reports chronic lower extremity edema with left greater than right, feeling poorly at 4 PM, dyspnea with heavy activity.   All other systems reviewed and are otherwise negative except as noted above.  Physical Exam    VS:  BP 120/70 (BP Location: Left Arm, Patient Position: Sitting, Cuff Size: Normal)   Pulse 86   Ht 5\' 2"  (1.575 m)   Wt 184 lb (83.5 kg)   SpO2 96%   BMI 33.65 kg/m  , BMI Body mass index is 33.65 kg/m. GEN: Well nourished, well developed, in no acute distress. HEENT: normal. Neck: Supple, no JVD, carotid bruits, or masses. Cardiac: IRIR, no murmurs, rubs, or gallops. No clubbing, cyanosis.  Moderate to 1+ bilateral leg edema with left greater than that of right.  Radials/DP/PT 2+ and equal bilaterally.  Respiratory:  Respirations regular and unlabored, clear to auscultation bilaterally. GI: Soft, nontender, nondistended, BS + x 4. MS: no deformity or atrophy. Skin: warm and dry, no rash. Neuro:  Strength and sensation are intact. Psych: Normal affect.  Accessory Clinical Findings    ECG personally reviewed by me today -atrial fibrillation, ventricular rate 86 bpm, IVCD with QRS 100 ms, low voltage- no acute changes.  VITALS Reviewed today   Temp Readings from Last 3 Encounters:  01/10/21 97.7 F (36.5 C)  09/01/20 98.2 F (36.8 C) (Oral)  07/04/20 98.2  F (36.8 C) (Oral)   BP Readings from Last 3 Encounters:  03/09/21 120/70  12/06/20 110/70  09/01/20 139/90   Pulse Readings from Last 3 Encounters:  03/09/21 86  12/06/20 64  09/01/20 84    Wt Readings from Last 3 Encounters:  03/09/21 184 lb (83.5 kg)  01/10/21 182 lb  (82.6 kg)  12/06/20 181 lb 2 oz (82.2 kg)     LABS  reviewed today   Lab Results  Component Value Date   WBC 3.6 03/02/2021   HGB 9.6 (A) 03/02/2021   HCT 31 (A) 03/02/2021   MCV 93.6 07/04/2020   PLT 262 03/02/2021   Lab Results  Component Value Date   CREATININE 1.3 (A) 03/02/2021   CREATININE 1.3 (A) 03/02/2021   BUN 33 (A) 03/02/2021   BUN 33 (A) 03/02/2021   NA 136 (A) 03/02/2021   NA 136 (A) 03/02/2021   K 4.1 03/02/2021   K 4.1 03/02/2021   CL 98 (A) 03/02/2021   CL 98 (A) 03/02/2021   CO2 30 (A) 03/02/2021   CO2 30 (A) 03/02/2021   Lab Results  Component Value Date   ALT 14 03/02/2021   AST 18 03/02/2021   ALKPHOS 48 03/02/2021   BILITOT 0.5 07/04/2020   Lab Results  Component Value Date   CHOL 146 03/02/2021   HDL 68 03/02/2021   LDLCALC 63 03/02/2021   LDLDIRECT 126.7 07/03/2013   TRIG 71 03/02/2021   CHOLHDL 2.2 02/03/2020    Lab Results  Component Value Date   HGBA1C 5.7 (H) 02/03/2020   Lab Results  Component Value Date   TSH 1.56 03/02/2021     STUDIES/PROCEDURES reviewed today   Echo 02/2020 1. Left ventricular ejection fraction, by estimation, is 60 to 65%. The  left ventricle has normal function. The left ventricle has no regional  wall motion abnormalities. There is moderate left ventricular hypertrophy.  Left ventricular diastolic  parameters are indeterminate.  2. Right ventricular systolic function is moderately reduced. The right  ventricular size is moderately enlarged. There is severely elevated  pulmonary artery systolic pressure.  3. Left atrial size was moderately dilated.  4. Right atrial size was moderately dilated.  5. The mitral valve is degenerative. Moderate to severe mitral valve  regurgitation.  6. Tricuspid valve regurgitation is severe.  7. The aortic valve is tricuspid. Aortic valve regurgitation is trivial.  Mild aortic valve sclerosis is present, with no evidence of aortic valve  stenosis.  8.  Mildly dilated pulmonary artery.   LHC 03/2018  Ost LM to Mid LM lesion is 80% stenosed.  Ost LAD to Prox LAD lesion is 100% stenosed.  Prox Cx lesion is 80% stenosed.  Prox RCA to Mid RCA lesion is 80% stenosed.  Mid RCA to Dist RCA lesion is 100% stenosed.  Prox Graft lesion is 30% stenosed.  Post Atrio lesion is 70% stenosed.  Prox Graft lesion is 95% stenosed.  Origin lesion is 30% stenosed.  LIMA and is normal in caliber.  The graft exhibits no disease.  Post intervention, there is a 0% residual stenosis.  A drug-eluting stent was successfully placed using a STENT RESOLUTE ONYX 4.0X15. 1.  Significant underlying three-vessel coronary artery disease with patent grafts including LIMA to LAD, SVG to RCA, SVG to OM and SVG to diagonal.  Patent stent in the SVG to RCA.  Severe new stenosis and SVG to diagonal is likely the culprit. 2.  Left ventricular angiography was not performed. 3.  High normal left ventricular end-diastolic pressure at 12 mmHg. 4.  Successful direct stenting of SVG to diagonal using a distal protection device.   Assessment & Plan    Chronic combined systolic and diastolic heart failure Pulmonary hypertension -- Appears relatively euvolemic and well compensated on exam.  She does report gradual weight increase, attributed to sedentary lifestyle.  She reports dyspnea with heavy exertion, attributed to sedentary lifestyle and deconditioning.  She notes concern over taking her Lasix 40 mg twice daily and request today to trial Lasix 40 mg daily.  Recommendation is for her to closely monitor BP and weights at home with this change.  Reds vest performed at 29%.  Labs reviewed with patient and recommendation is to continue Lasix 40 mg twice daily; however, it is reasonable for her to trial Lasix 40 mg daily and restart if weight gain 3 pounds overnight or 5 pounds in a week.  Continue spironolactone 25 mg daily.  She remains on ramipril 5 mg daily.  Given her  valvular dysfunction on 2021 echo, consider repeat echo at RTC.  Valvular dysfunction -- 02/2020 echo with severe tricuspid regurgitation, moderate to severe mitral regurgitation, mild aortic sclerosis without evidence of stenosis.  Consider repeat echo at RTC to reevaluate valvular function, given these findings.  We did discuss the importance of heart rate, blood pressure, and volume control given her valvular function.  Coronary artery disease -- No symptoms concerning for angina.  No indication for further ischemic work-up.  Continue current medications.  She is on Eliquis in lieu of ASA.  Given beta-blocker intolerance, she is continued on verapamil.  Continue Repatha and Zetia for aggressive risk factor modification.  Atrial fibrillation -- Denies tachypalpitations.  Not on beta-blockers secondary to profound fatigue and hypotension..  Continue verapamil 180 mg twice daily.  Given her CHA2DS2-VASc score of at least 6, recommend chronic anticoagulation with Eliquis 5 mg twice daily.  She denies any signs or symptoms of bleeding.   Hyperlipidemia, LDL goal less than 70 Statin intolerance --Continue Repatha and Zetia.  LDL goal of less than 70.  Essential hypertension, goal BP 130/80 or lower -- BP today well controlled.  Recommend she monitor her BP closely with any decrease in diuresis, which she plans to do given concern regarding GFR on recent lab work.  Continue verapamil 180 mg twice daily, spironolactone 25 mg daily, ramipril 5 mg daily.   Disposition: RTC 2 weeks to reassess if decreased diuresis ensure stable volume function  *Please be aware that the above documentation was completed voice recognition software and may contain dictation errors.       Arvil Chaco, PA-C 03/09/2021

## 2021-03-09 NOTE — Patient Instructions (Signed)
Medication Instructions:  Your physician has recommended you make the following change in your medication:   DECREASE Furosemide (Lasix) to 40mg  daily    - Please contact our office if you notice increase in BP and/or shortness of breath after this change  *If you need a refill on your cardiac medications before your next appointment, please call your pharmacy*   Lab Work:  None ordered  Testing/Procedures:  None ordered   Follow-Up: At [facility], you and your health needs are our priority.  As part of our continuing mission to provide you with exceptional heart care, we have created designated Provider Care Teams.  These Care Teams include your primary Cardiologist (physician) and Advanced Practice Providers (APPs -  Physician Assistants and Nurse Practitioners) who all work together to provide you with the care you need, when you need it.  We recommend signing up for the patient portal called "MyChart".  Sign up information is provided on this After Visit Summary.  MyChart is used to connect with patients for Virtual Visits (Telemedicine).  Patients are able to view lab/test results, encounter notes, upcoming appointments, etc.  Non-urgent messages can be sent to your provider as well.   To learn more about what you can do with MyChart, go to [email or web address].    Your next appointment:   2 week(s)  The format for your next appointment:   In Person  Provider:   You may see Ida Rogue, MD or one of the following Advanced Practice Providers on your designated Care Team:     Marrianne Mood, [place]

## 2021-03-09 NOTE — Telephone Encounter (Signed)
LMTCB

## 2021-03-10 NOTE — Telephone Encounter (Signed)
Patient called about taking the ferrous sulfate. See note below. Please call patient at [phone].

## 2021-03-10 NOTE — Telephone Encounter (Signed)
Ferrous sulfate clarified. Patient is going to have labs done with [facility]

## 2021-03-13 DIAGNOSIS — H26491 Other secondary cataract, right eye: Secondary | ICD-10-CM | POA: Diagnosis not present

## 2021-03-13 NOTE — Telephone Encounter (Signed)
Labs printed for signature

## 2021-03-16 ENCOUNTER — Other Ambulatory Visit: Payer: Self-pay | Admitting: Internal Medicine

## 2021-03-16 ENCOUNTER — Telehealth: Payer: Self-pay | Admitting: Internal Medicine

## 2021-03-16 NOTE — Telephone Encounter (Signed)
Patient stated there is a change in her traZODone (DESYREL) 150 MG tablet. 2 pills at bed time

## 2021-03-18 ENCOUNTER — Other Ambulatory Visit: Payer: Self-pay | Admitting: Internal Medicine

## 2021-03-20 NOTE — Telephone Encounter (Signed)
No I do not recommend 300mg  trazodone q hs.  Titration to doses above 150 mg nightly is unlikely to help insomnia symptoms and is not advised due to increased risk of adverse effects.  She needs to titrate back down to doses of maximum of 150mg  q hs.  Let me know if questions.

## 2021-03-20 NOTE — Telephone Encounter (Signed)
Patient called to let me know that she has titrated her trazodone for years and she is doing better since she titrated up to 300 mg qhs. She was just wondering if you were ok with sending in a new script with new directions. She just picked up rx so does not need right now.

## 2021-03-20 NOTE — Telephone Encounter (Signed)
LMTCB

## 2021-03-21 ENCOUNTER — Other Ambulatory Visit: Payer: Self-pay | Admitting: Internal Medicine

## 2021-03-21 NOTE — Telephone Encounter (Signed)
Patient says that she does not take this for insomnia, she uses for depression. Patient states that she has been doing this for almost 50 years and it is her life line. She feels like she cant function without it. The 150 mg dose was not enough. She was previously taking 1 2/3 at night time and titrated up to 300 mg q hs one month ago and it has helped her function.

## 2021-03-21 NOTE — Telephone Encounter (Signed)
RX Refill:tramadol Last Seen:11-18-20 Last ordered:12-31-20

## 2021-03-22 ENCOUNTER — Other Ambulatory Visit: Payer: Self-pay

## 2021-03-22 MED ORDER — TRAZODONE HCL 150 MG PO TABS: ORAL_TABLET | ORAL | 1 refills | Status: DC

## 2021-03-22 NOTE — Telephone Encounter (Signed)
Ok to refill trazodone 150mg  - take two tablets q hs #180 with one refill.  Please change directions on medication list and notify pt being sent in.  Also, it appears she is overdue a f/u appt with me.

## 2021-03-22 NOTE — Telephone Encounter (Signed)
FYI I have sent for patient the 300 mg (2 tablets) total of trazodone at bedtime as needed for sleep. Pt aware & scheduled f/u for September. I advised if she needed sooner to call for work in. Patient stated that September was fine for her.

## 2021-03-22 NOTE — Telephone Encounter (Signed)
Confirm trazodone 150mg  (2 tablets) sent in.

## 2021-03-22 NOTE — Telephone Encounter (Signed)
Rx ok'd for trazodone #60 with one refill.

## 2021-03-29 ENCOUNTER — Telehealth: Payer: Self-pay | Admitting: Internal Medicine

## 2021-03-29 DIAGNOSIS — R399 Unspecified symptoms and signs involving the genitourinary system: Secondary | ICD-10-CM

## 2021-03-29 NOTE — Telephone Encounter (Signed)
LMTCB

## 2021-03-29 NOTE — Telephone Encounter (Signed)
PT called to see if Larena Glassman or Dr.Scott can contact her in regards to having a urine sample done at the location she is at so she can have a nurse help her.

## 2021-03-29 NOTE — Telephone Encounter (Signed)
Pt called returning your call 

## 2021-03-31 ENCOUNTER — Telehealth: Payer: Self-pay | Admitting: Internal Medicine

## 2021-03-31 DIAGNOSIS — R350 Frequency of micturition: Secondary | ICD-10-CM | POA: Diagnosis not present

## 2021-03-31 DIAGNOSIS — N3941 Urge incontinence: Secondary | ICD-10-CM | POA: Diagnosis not present

## 2021-03-31 NOTE — Telephone Encounter (Signed)
Okay for orders? If so can place and call them back

## 2021-03-31 NOTE — Telephone Encounter (Signed)
Orders placed, printed, and faxed. Confirmation received.  Left message to return call for [facility]

## 2021-03-31 NOTE — Telephone Encounter (Signed)
Ok to check urine and please confirm how pt is doing and what symptoms she is currently having.  If any vomiting, increased abdominal pain, fever - she needs to be seen.

## 2021-03-31 NOTE — Telephone Encounter (Signed)
Patient has not heard back about getting an order for urine. Charlotte Wagner from [facility] called and states that patient is still requesting a urine sample. Phone number for Charlotte Wagner is [phone] and fax number is [phone].

## 2021-03-31 NOTE — Telephone Encounter (Signed)
Dr. Nicki Reaper gave permission to send over lab orders for Urine and culture. I have informed Luellen Pucker and Dr. Delene Loll of this information. Yves Dill will fax orders over.

## 2021-03-31 NOTE — Telephone Encounter (Signed)
err

## 2021-03-31 NOTE — Telephone Encounter (Signed)
Charlotte Wagner called back in and informed of the below

## 2021-03-31 NOTE — Addendum Note (Signed)
Addended by: Thressa Sheller on: 03/31/2021 01:57 PM   Modules accepted: Orders

## 2021-04-05 ENCOUNTER — Other Ambulatory Visit: Payer: Self-pay | Admitting: Cardiovascular Disease

## 2021-04-05 ENCOUNTER — Encounter: Payer: Self-pay | Admitting: Cardiovascular Disease

## 2021-04-05 ENCOUNTER — Other Ambulatory Visit: Payer: Self-pay

## 2021-04-05 ENCOUNTER — Ambulatory Visit (INDEPENDENT_AMBULATORY_CARE_PROVIDER_SITE_OTHER): Payer: Medicare Other | Admitting: Cardiovascular Disease

## 2021-04-05 VITALS — BP 115/63 | HR 73 | Ht 62.0 in | Wt 182.0 lb

## 2021-04-05 DIAGNOSIS — I10 Essential (primary) hypertension: Secondary | ICD-10-CM | POA: Diagnosis not present

## 2021-04-05 DIAGNOSIS — I25118 Atherosclerotic heart disease of native coronary artery with other forms of angina pectoris: Secondary | ICD-10-CM

## 2021-04-05 DIAGNOSIS — I272 Pulmonary hypertension, unspecified: Secondary | ICD-10-CM | POA: Diagnosis not present

## 2021-04-05 DIAGNOSIS — I4819 Other persistent atrial fibrillation: Secondary | ICD-10-CM

## 2021-04-05 DIAGNOSIS — E785 Hyperlipidemia, unspecified: Secondary | ICD-10-CM

## 2021-04-05 DIAGNOSIS — I059 Rheumatic mitral valve disease, unspecified: Secondary | ICD-10-CM

## 2021-04-05 DIAGNOSIS — I071 Rheumatic tricuspid insufficiency: Secondary | ICD-10-CM

## 2021-04-05 DIAGNOSIS — I5032 Chronic diastolic (congestive) heart failure: Secondary | ICD-10-CM | POA: Diagnosis not present

## 2021-04-05 NOTE — Progress Notes (Signed)
Called Luellen Pucker over at Dr. Bary Leriche office and they are getting these labs on July 14 th. Confirmed BMP is being done that day.

## 2021-04-05 NOTE — Progress Notes (Signed)
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 Medications reviewed by the patient verbally.     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  coronary artery disease  prior bypass surgery 1998 stent to  graft 8/17  Echo EF 1/18 with EF 60-65%   moderate to severe left atrial enlargement cardioversion that did not hold,  reverted to atrial fibrillation Did not feel well on b-blocker, better on ca channel blocker Statin intolerance , on Repatha Who presents for routine follow-up of her chronic stable angina, atrial fibrillation    [name] reports overall doing well Lives at [facility], assisted living  Uses a walker Compression hose bilaterally Swelling worse on the left than the right, chronic issue  Recent increase in weight up to 186 pounds with increased swelling left lower extremity, etiology unclear Increased her diuretics and over the next several days improvement of her symptoms, weight back down to 180  Blood pressure stable 324 systolic legs are weaker  No falls Not on oxygen  Larynx paralyzed, difficulty talking, very soft-spoken  Total cholesterol 154 LDL 63 Creatinine 1.3  EKG personally reviewed by myself on todays visit  atrial fib rate 73 beats per minute ,poor R wave progression, no change from prior studies  Other past medical history reviewed echocardiogram 02/2020 Left ventricular ejection fraction, by estimation, is 60 to 65%. The  left ventricle has normal function. right ventricular size is moderately enlarged. severely  elevated  pulmonary artery systolic pressure.  TR Peak grad:   61.8 mmHg  TR Vmax:        393.00 cm/s   Cath 03/31/2018 Significant underlying three-vessel coronary artery disease with patent grafts including LIMA to LAD, SVG to RCA, SVG to OM and SVG to diagonal.  Patent stent in the SVG to RCA.  Severe new stenosis and SVG to diagonal is likely the culprit. STENT RESOLUTE ONYX 4.0X15. to SVG to diag  hospital admission for acute on chronic diastolic and systolic CHF Cath 01/07/271 .  Significant underlying three-vessel coronary artery disease with patent grafts including LIMA to LAD, SVG to RCA, SVG to OM and SVG to diagonal.  Patent stent in the SVG to RCA.  Severe new stenosis and SVG to diagonal is likely the culprit. 2.  Left ventricular angiography was not performed. 3.  High normal left ventricular end-diastolic pressure at 12 mmHg. 4.  Successful direct stenting of SVG to diagonal using a distal protection device.   ECHO Prior to cardiac catheterization and stent placement ejection fraction was in the range of 30% to 35%. Hypokinesis of the anteroseptal myocardium, apical,  anterior myocardium.  - Mitral valve: There was mild regurgitation. - Left atrium: The atrium was mildly to moderately dilated. - Right ventricle: Systolic function was moderately to   severely reduced. - Right atrium: The atrium was mildly dilated.  patent foramen ovale. - Tricuspid valve: moderate-severe regurgitation. - Pulmonary arteries: Systolic pressure was moderate to severely elevated PA peak pressure: 58 mm Hg (  S).    long history of Statin intolerance, tried 4 different medications, had severe myalgias,  Lipitor,crestor, simvastatin all had severe side effects  on Zetia, tolerating this Cholesterol still above goal, LDL 112 on Zetia alone   History of bleeding from hemorrhoids. Stable recently   Carotid ultrasound Moderate atherosclerotic plaque over the left carotid bulb     Prior CV  studies:   The following studies were reviewed today:   Past Medical History:  Diagnosis Date   Allergy    Arthritis    s/p bilateral knees and left hip replacement   Asthma    CAD (coronary artery disease)    a. 12/1996 s/p CABG x4 [facility] [place]);  b. 2005 Pt reports stress test & cath, which revealed patent grafts.   Cancer [facility])    melanoma right arm   Carotid arterial disease ([place])    a. 04/2015 Carotid U/S: <50% bilat ICA stenosis.   Chronic kidney disease    Colon polyps    H/O   Depression    GERD (gastroesophageal reflux disease)    h/o hiatal hernia   Headache    migraines in past   Heart murmur    a. 04/2011 Echo: EF 55-60%, bilat atrial enlargement, mild to mod TR.   History of chicken pox    History of hiatal hernia    Hx of migraines    rare now   Hx: UTI (urinary tract infection)    Hyperlipidemia    a. Statin intolerant -->on zetia.   Hypertension    Hypertensive heart disease    Lichen planus    Melanoma (HCC)    Palpitations    a. rare PVC's and h/o SVT.   PMR (polymyalgia rheumatica) (HCC)    h/o in setting of crestor usage.   PSVT (paroxysmal supraventricular tachycardia) (HCC)    PUD (peptic ulcer disease)    remote history   Raynaud's phenomenon    Spinal stenosis    Urine incontinence    H/O   Vitamin D deficiency    Past Surgical History:  Procedure Laterality Date   ADENOIDECTOMY     age 26   BACK SURGERY     L3-L5   BILATERAL CARPAL TUNNEL RELEASE     BREAST BIOPSY Right    bx x 3 neg   BREAST SURGERY Right    biopsy x 3 (all benign)   CARDIAC CATHETERIZATION N/A 06/01/2016   Procedure: LEFT HEART CATH AND CORS/GRAFTS ANGIOGRAPHY;  Surgeon: Wellington Hampshire, MD;  Location: [place] [facility];  Service: Cardiovascular;  Laterality: N/A;   CARDIAC CATHETERIZATION N/A 06/01/2016   Procedure: Coronary Stent Intervention;  Surgeon: Wellington Hampshire, MD;  Location: [place] [facility];  Service: Cardiovascular;  Laterality: N/A;    CARDIOVERSION N/A 03/08/2017   Procedure: CARDIOVERSION;  Surgeon: Wellington Hampshire, MD;  Location: [facility] ORS;  Service: Cardiovascular;  Laterality: N/A;   CATARACT EXTRACTION W/PHACO Right 01/04/2016   Procedure: CATARACT EXTRACTION PHACO AND INTRAOCULAR LENS PLACEMENT ([place]);  Surgeon: Leandrew Koyanagi, MD;  Location: [place];  Service: Ophthalmology;  Laterality: Right;  MALYUGIN   CATARACT EXTRACTION W/PHACO Left 01/25/2016   Procedure: CATARACT EXTRACTION PHACO AND INTRAOCULAR LENS PLACEMENT ([place]) left eye;  Surgeon: Leandrew Koyanagi, MD;  Location: [place];  Service: Ophthalmology;  Laterality: Left;  MALYUGIN SHUGARCAINE   CHOLECYSTECTOMY  90's   COLONOSCOPY  03/19/2020   Procedure: COLONOSCOPY;  Surgeon: Virgel Manifold, MD;  Location: [facility] ENDOSCOPY;  Service: Gastroenterology;;   COLONOSCOPY WITH PROPOFOL N/A 05/03/2015   Procedure: COLONOSCOPY WITH PROPOFOL;  Surgeon: Lollie Sails, MD;  Location: [facility] ENDOSCOPY;  Service: Endoscopy;  Laterality: N/A;   COLONOSCOPY WITH PROPOFOL N/A 03/19/2020   Procedure: COLONOSCOPY WITH PROPOFOL;  Surgeon: Virgel Manifold, MD;  Location: [facility] ENDOSCOPY;  Service: Endoscopy;  Laterality: N/A;   CORONARY ARTERY BYPASS GRAFT  98   4 vessel   CORONARY ARTERY BYPASS GRAFT     x 4 age 14 per pt    CORONARY STENT INTERVENTION N/A 03/31/2018   Procedure: CORONARY STENT INTERVENTION;  Surgeon: Wellington Hampshire, MD;  Location: [place] CV [facility];  Service: Cardiovascular;  Laterality: N/A;   ESOPHAGOGASTRODUODENOSCOPY N/A 03/22/2015   Procedure: ESOPHAGOGASTRODUODENOSCOPY (EGD);  Surgeon: Lollie Sails, MD;  Location: [facility] ENDOSCOPY;  Service: Endoscopy;  Laterality: N/A;   ESOPHAGOGASTRODUODENOSCOPY N/A 03/19/2020   Procedure: ESOPHAGOGASTRODUODENOSCOPY (EGD);  Surgeon: Virgel Manifold, MD;  Location: [facility] ENDOSCOPY;  Service: Endoscopy;  Laterality: N/A;   HEMORRHOID BANDING     HEMORRHOID SURGERY N/A  02/17/2018   Procedure: HEMORRHOIDECTOMY;  Surgeon: Robert Bellow, MD;  Location: [facility] ORS;  Service: General;  Laterality: N/A;   JOINT REPLACEMENT     BILATERAL KNEE REPLACEMENTS   KNEE ARTHROSCOPY W/ OATS PROCEDURE     Lt knee (9/01), Rt knee (3/11), Lt hip (5/10)   LEFT HEART CATH AND CORONARY ANGIOGRAPHY N/A 03/31/2018   Procedure: LEFT HEART CATH AND CORONARY ANGIOGRAPHY;  Surgeon: Wellington Hampshire, MD;  Location: [place] [facility];  Service: Cardiovascular;  Laterality: N/A;   TOTAL HIP ARTHROPLASTY       Current Meds  Medication Sig   acetaminophen (TYLENOL) 500 MG tablet Take 1,000 mg by mouth 2 (two) times daily.   amitriptyline (ELAVIL) 25 MG tablet TAKE 1 TABLET BY MOUTH AT BEDTIME   b complex vitamins tablet Take 1 tablet by mouth daily.   Cholecalciferol (VITAMIN D3) 2000 UNITS TABS Take 2,000 Units by mouth 2 (two) times a week. Tuesday and Saturdays.   clotrimazole (MYCELEX) 10 MG troche Take 10 mg by mouth as needed.   Coenzyme Q10 (COQ-10) 200 MG CAPS Take 200 mg by mouth daily.    conjugated estrogens (PREMARIN) vaginal cream Apply 0.5mg  (pea-sized amount)  just inside the vaginal introitus with a finger-tip on  Monday, Wednesday and Friday nights. (Patient taking differently: Apply 0.5mg  (pea-sized amount)  just inside the vaginal introitus with a finger-tip on  Monday, Wednesday and Friday nights.)   CRANBERRY PO Take 1 capsule by mouth daily.   ELIQUIS 5 MG TABS tablet TAKE 1 TABLET BY MOUTH TWICE DAILY   ezetimibe (ZETIA) 10 MG tablet TAKE 1 TABLET BY MOUTH DAILY   furosemide (LASIX) 40 MG tablet Take 40 mg tablet in the am and 20 mg in the pm   hydrocortisone 2.5 % cream Apply to VULVA at bedtime as needed with miconazole for lichen planus   Iron, Ferrous Sulfate, 325 (65 Fe) MG TABS Take 325 mg by mouth daily.   loratadine (CLARITIN) 10 MG tablet Take 10 mg by mouth daily.   meclizine (ANTIVERT) 25 MG tablet Take 1 tablet (25 mg total) by mouth 3 (three)  times daily as needed for dizziness.   Multiple Vitamins-Minerals (HAIR/SKIN/NAILS PO) Take 1 tablet by mouth daily.   mupirocin ointment (BACTROBAN) 2 % Apply to affected area bid   pantoprazole (PROTONIX) 40 MG tablet TAKE 1 TABLET BY MOUTH TWICE DAILY   polyethylene  glycol powder (GLYCOLAX/MIRALAX) 17 GM/SCOOP powder Take 8.5 g by mouth daily. Hold if diarrhea   ramipril (ALTACE) 5 MG capsule TAKE 1 CAPSULE(5 MG) BY MOUTH DAILY   REPATHA SURECLICK 295 MG/ML SOAJ INJECT 1 PEN EVERY 14 DAYS AS DIRECTED   traMADol (ULTRAM) 50 MG tablet TAKE ONE TABLET TWICE DAILY AS NEEDED   traZODone (DESYREL) 150 MG tablet Take two tablets by mouth at bedtime as needed for sleep.   triamcinolone cream (KENALOG) 0.1 % Apply topically 2 (two) times daily.   verapamil (CALAN-SR) 180 MG CR tablet TAKE ONE (1) TABLET BY MOUTH TWO TIMES PER DAY   vitamin B-12 (CYANOCOBALAMIN) 1000 MCG tablet Take 1,000 mcg by mouth daily.   vitamin C (ASCORBIC ACID) 500 MG tablet Take 500 mg by mouth at bedtime.   [DISCONTINUED] spironolactone (ALDACTONE) 25 MG tablet TAKE 1 TABLET BY MOUTH EVERY MORNING     Allergies:   Beta adrenergic blockers; Brilinta [ticagrelor]; Albuterol; Antihistamines, diphenhydramine-type; Aspirin; Nsaids; Penicillins; Statins; Sulfa antibiotics; Sulfasalazine; Tetracycline; and Tetracyclines & related   Social History   Tobacco Use   Smoking status: Former    Pack years: 0.00    Types: Cigarettes   Smokeless tobacco: Never   Tobacco comments:    quit 47+ yrs ago  Vaping Use   Vaping Use: Never used  Substance Use Topics   Alcohol use: No    Alcohol/week: 0.0 standard drinks   Drug use: No      Family Hx: The patient's family history includes Alcohol abuse in her father; Depression in her daughter, father, maternal grandmother, and son; Heart disease in her father; Hypertension in her maternal grandfather and paternal grandfather; Lung cancer in her sister; Seizures in her son; Stroke in  her maternal grandmother; Thyroid disease in her daughter and mother. There is no history of Breast cancer.  ROS:   Please see the history of present illness.    Review of Systems  Constitutional: Negative.   HENT: Negative.    Respiratory:  Positive for shortness of breath.   Cardiovascular:  Positive for leg swelling.  Gastrointestinal: Negative.   Musculoskeletal: Negative.        Gait instability  Neurological: Negative.   Psychiatric/Behavioral: Negative.    All other systems reviewed and are negative.   Labs/Other Tests and Data Reviewed:    Recent Labs: 03/02/2021: ALT 14; BUN 33; BUN 33; Creatinine 1.3; Creatinine 1.3; Hemoglobin 9.6; Platelets 262; Potassium 4.1; Potassium 4.1; Sodium 136; Sodium 136; TSH 1.56   Recent Lipid Panel Lab Results  Component Value Date/Time   CHOL 146 03/02/2021 12:00 AM   TRIG 71 03/02/2021 12:00 AM   HDL 68 03/02/2021 12:00 AM   CHOLHDL 2.2 02/03/2020 09:03 AM   LDLCALC 63 03/02/2021 12:00 AM   LDLDIRECT 126.7 07/03/2013 09:37 AM    Wt Readings from Last 3 Encounters:  04/05/21 182 lb (82.6 kg)  03/09/21 184 lb (83.5 kg)  01/10/21 182 lb (82.6 kg)     Exam:    Vital Signs: Vital signs may also be detailed in the HPI BP 115/63 (BP Location: Left Arm, Patient Position: Sitting, Cuff Size: Normal)   Pulse 73   Ht 5\' 2"  (1.575 m)   Wt 182 lb (82.6 kg)   SpO2 97%   BMI 33.29 kg/m    Constitutional:  oriented to person, place, and time. No distress.  Soft-spoken HENT:  Head: Grossly normal Eyes:  no discharge. No scleral icterus.  Neck: No JVD, no carotid  bruits  Cardiovascular: Regular rate and rhythm, no murmurs appreciated Trace edema left lower extremity Pulmonary/Chest: Clear to auscultation bilaterally, no wheezes or rales Abdominal: Soft.  no distension.  no tenderness.  Musculoskeletal: Normal range of motion Neurological:  normal muscle tone. Coordination normal. No atrophy Skin: Skin warm and dry Psychiatric:  normal affect, pleasant  ASSESSMENT & PLAN:    Persistent atrial fibrillation Rate controlled, on anticoagulation No medication changes made Contributing to CHF as below  Pulmonary hypertension/diastolic CHF  Lasix  40 in Am, 20 in PM Periodically needing extra doses of Lasix for weight gain Recently treated herself appropriately for weight 186 pounds, now down to 180 and leg edema much improved -Potentially could consider metolazone 2.5 mg as needed if weight does not improve on higher dose Lasix 40 twice daily Creatinine 1.3, high end of the range Repeat BMP ordered for new baseline  Chronic diastolic heart failure (HCC)  Lasix 40 in the mornings, 20 mg in the p.m. BMP ordered  Coronary artery disease of native artery of native heart with stable angina pectoris ([place]) Currently with no symptoms of angina. No further workup at this time. Continue current medication regimen.  Hypercholesterolemia Cholesterol is at goal on the current lipid regimen. No changes to the medications were made.  Essential hypertension, benign Blood pressure is well controlled on today's visit. No changes made to the medications.  Dyspnea, unspecified type  pulmonary hypertension, kyphosis, deconditioning, atrial fibrillation Legs weak, limited exercise Relatively stable  TGA (transient global amnesia) Denies any further episodes, cognition stable stable   Total encounter time more than 25 minutes  Greater than 50% was spent in counseling and coordination of care with the patient    Signed, Ida Rogue, MD  04/05/2021 4:07 PM    [place] [id number], [place], [place] [address]

## 2021-04-05 NOTE — Patient Instructions (Addendum)
We will call Charlotte Wagner: [phone] We will add a BMP to labs (from Dr. Nicki Reaper)  Can we ask pharmacy for help to see if there is something we can do concerning price of repatha, Company assistance Current >$100 a month   Medication Instructions:  No changes  If you need a refill on your cardiac medications before your next appointment, please call your pharmacy.    Lab work: No new labs needed   If you have labs (blood work) drawn today and your tests are completely normal, you will receive your results only by: [place] (if you have MyChart) OR A paper copy in the mail If you have any lab test that is abnormal or we need to change your treatment, we will call you to review the results.   Testing/Procedures: No new testing needed   Follow-Up: At [facility], you and your health needs are our priority.  As part of our continuing mission to provide you with exceptional heart care, we have created designated Provider Care Teams.  These Care Teams include your primary Cardiologist (physician) and Advanced Practice Providers (APPs -  Physician Assistants and Nurse Practitioners) who all work together to provide you with the care you need, when you need it.  You will need a follow up appointment in 6 months  Providers on your designated Care Team:   Murray Hodgkins, NP Christell Faith, PA-C Marrianne Mood, PA-C  Any Other Special Instructions Will Be Listed Below (If Applicable).  COVID-19 Vaccine Information can be found at: ShippingScam.co.uk For questions related to vaccine distribution or appointments, please email [email or web address] or call [phone].

## 2021-04-05 NOTE — Telephone Encounter (Signed)
Prescription refill request for Eliquis received. Indication: Atrial fib Last office visit: 04/05/21  T.Gollan MD Scr: 1.3 on 03/02/21 Age: 85 Weight: 82.6kg  Based on above findings Eliquis 5mg  twice daily is the appropriate dose.  Refill approved.

## 2021-04-05 NOTE — Telephone Encounter (Signed)
Refill request

## 2021-04-13 ENCOUNTER — Other Ambulatory Visit: Payer: Self-pay | Admitting: Internal Medicine

## 2021-04-17 ENCOUNTER — Telehealth: Payer: Self-pay | Admitting: Cardiovascular Disease

## 2021-04-17 NOTE — Telephone Encounter (Signed)
[employer] calling to discuss the assistance forms for Repatha Transferred to [employer]

## 2021-04-17 NOTE — Telephone Encounter (Signed)
Eliezer Lofts called about patient assistance application. They are requesting additional information, they need patient's proof of income, Part D insurance information, a new valid prescription (the one they have does not have the correct date of birth on it) and the PA approval for Repatha. Please fax information to them at [phone]. If you have any questions, you may call them at [phone] Monday thru Friday from 8 AM to 8 PM. They have also left this information on the patient voicemail.

## 2021-04-18 NOTE — Telephone Encounter (Signed)
Attempted to reach out to Charlotte Wagner regarding Repatha PA. [employer] called in yesterday and spoke with Arbie Cookey, CMA (refer to earlier phone note), they are requesting additional information, unsure if there was PA application that has started and sent in with wrong info and missing documentation.   RN that encounter pt at her recent office visit on 6/29 about Repatha is not available to discuss issues at this time. Will await for pt to return call to discuss further or wait to RN returns to fill in the loop holes.

## 2021-04-18 NOTE — Telephone Encounter (Signed)
Incoming call from Mrs. Charlotte Wagner, Mrs. Charlotte Wagner reports has been on Repatha for 2-3 years, at her recent office visit 6/29 with Dr. Bluford Kaufmann, RN had her sign PA application for from [facility] and advised Mrs. Charlotte Wagner she will submit the application.   Explained to Mrs. Charlotte Wagner that the [employer] called yesterday with missing documentation and wrong DOB. Per phone encounter with Arbie Cookey and [facility]   "requesting additional information, they need patient's proof of income, Part D insurance information, a new valid prescription (the one they have does not have the correct date of birth on it) and the PA approval for Repatha"  Advised will discuss with Pam, RN tomorrow, as she is out the office today, to see if she can review the application and send in missing documents and correct pt's DPB.  Mrs. Charlotte Wagner is thankful for the phone encounter and will await for PA to be completed and their response as she has been getting coverage in the past.   Message r/t Charlotte Patella, RN

## 2021-04-19 NOTE — Telephone Encounter (Signed)
Application reviewed and sent via fax with updates.

## 2021-04-25 DIAGNOSIS — H26491 Other secondary cataract, right eye: Secondary | ICD-10-CM | POA: Diagnosis not present

## 2021-04-27 ENCOUNTER — Encounter: Payer: Self-pay | Admitting: Cardiovascular Disease

## 2021-04-27 DIAGNOSIS — I10 Essential (primary) hypertension: Secondary | ICD-10-CM | POA: Diagnosis not present

## 2021-04-27 DIAGNOSIS — D649 Anemia, unspecified: Secondary | ICD-10-CM | POA: Diagnosis not present

## 2021-05-04 ENCOUNTER — Telehealth: Payer: Self-pay | Admitting: Cardiovascular Disease

## 2021-05-04 NOTE — Telephone Encounter (Signed)
Letter received from patient with request for additional information.   Called [employer] to review status of her application. They requested that patient answer a few questions then provide proof of income. Inquired about the soft credit check for the income but they still require proof to compare with that check. Advised I would certainly review with patient and then update her application and fax back to them. I did confirm all needed information to help expedite this application. Will reach out to patient to review this information and needed documents.

## 2021-05-04 NOTE — Telephone Encounter (Signed)
Spoke with patient and reviewed required documents to update application and resend. Advised they are requiring proof of income in order to reprocess. Completed questions that were required and she will send [place] her tax return via mail so that we can update her application for consideration. She was very appreciative for the assistance with no further needs at this time. Will place application in my file box under "Patient Assistance" in folder "Pending Applications" until we received those needed documents to resend her application. Requested she please put on envelope "Attention Jule Ser" to make sure I receive and can follow up on this. She verbalized understanding of our conversation with no further questions at this time.

## 2021-05-05 ENCOUNTER — Other Ambulatory Visit: Payer: Self-pay | Admitting: Internal Medicine

## 2021-05-05 NOTE — Telephone Encounter (Signed)
RX Refill:tramadol Last Seen:11-18-20 Last ordered:03-22-21

## 2021-05-05 NOTE — Telephone Encounter (Signed)
Rx ok'd for tramadol #60 with no refills.

## 2021-05-17 NOTE — Telephone Encounter (Signed)
Application completed and placed in samples file box

## 2021-05-19 ENCOUNTER — Telehealth: Payer: Self-pay

## 2021-05-19 NOTE — Telephone Encounter (Signed)
Charlotte Wagner APPROVED for for PA for Repatha until 10/07/2021  Pt Case ID: [id number]

## 2021-05-29 ENCOUNTER — Ambulatory Visit (INDEPENDENT_AMBULATORY_CARE_PROVIDER_SITE_OTHER): Payer: Medicare Other

## 2021-05-29 VITALS — Ht 62.0 in | Wt 182.0 lb

## 2021-05-29 DIAGNOSIS — Z0000 Encounter for general adult medical examination without abnormal findings: Secondary | ICD-10-CM | POA: Diagnosis not present

## 2021-05-29 NOTE — Patient Instructions (Addendum)
Ms. Boyarsky , Thank you for taking time to come for your Medicare Wellness Visit. I appreciate your ongoing commitment to your health goals. Please review the following plan we discussed and let me know if I can assist you in the future.   These are the goals we discussed:  Goals       Patient Stated     Monitor sodium intake (pt-stated)        This is a list of the screening recommended for you and due dates:  Health Maintenance  Topic Date Due   Flu Shot  07/15/2021*   Zoster (Shingles) Vaccine (1 of 2) 08/29/2021*   Mammogram  05/29/2022*   DEXA scan (bone density measurement)  05/29/2022*   Tetanus Vaccine  05/29/2022*   COVID-19 Vaccine (5 - Booster for Moderna series) 06/27/2021   Colon Cancer Screening  03/19/2025   Pneumonia vaccines  Completed   HPV Vaccine  Aged Out  *Topic was postponed. The date shown is not the original due date.    Advanced directives: End of life planning; Advance aging; Advanced directives discussed.  Copy of current HCPOA/Living Will requested.    Conditions/risks identified: none new.  Follow up in one year for your annual wellness visit   Preventive Care 65 Years and Older, Female Preventive care refers to lifestyle choices and visits with your health care provider that can promote health and wellness. What does preventive care include? A yearly physical exam. This is also called an annual well check. Dental exams once or twice a year. Routine eye exams. Ask your health care provider how often you should have your eyes checked. Personal lifestyle choices, including: Daily care of your teeth and gums. Regular physical activity. Eating a healthy diet. Avoiding tobacco and drug use. Limiting alcohol use. Practicing safe sex. Taking low-dose aspirin every day. Taking vitamin and mineral supplements as recommended by your health care provider. What happens during an annual well check? The services and screenings done by your health care  provider during your annual well check will depend on your age, overall health, lifestyle risk factors, and family history of disease. Counseling  Your health care provider may ask you questions about your: Alcohol use. Tobacco use. Drug use. Emotional well-being. Home and relationship well-being. Sexual activity. Eating habits. History of falls. Memory and ability to understand (cognition). Work and work Statistician. Reproductive health. Screening  You may have the following tests or measurements: Height, weight, and BMI. Blood pressure. Lipid and cholesterol levels. These may be checked every 5 years, or more frequently if you are over 51 years old. Skin check. Lung cancer screening. You may have this screening every year starting at age 86 if you have a 30-pack-year history of smoking and currently smoke or have quit within the past 15 years. Fecal occult blood test (FOBT) of the stool. You may have this test every year starting at age 45. Flexible sigmoidoscopy or colonoscopy. You may have a sigmoidoscopy every 5 years or a colonoscopy every 10 years starting at age 31. Hepatitis C blood test. Hepatitis B blood test. Sexually transmitted disease (STD) testing. Diabetes screening. This is done by checking your blood sugar (glucose) after you have not eaten for a while (fasting). You may have this done every 1-3 years. Bone density scan. This is done to screen for osteoporosis. You may have this done starting at age 23. Mammogram. This may be done every 1-2 years. Talk to your health care provider about how often you  should have regular mammograms. Talk with your health care provider about your test results, treatment options, and if necessary, the need for more tests. Vaccines  Your health care provider may recommend certain vaccines, such as: Influenza vaccine. This is recommended every year. Tetanus, diphtheria, and acellular pertussis (Tdap, Td) vaccine. You may need a Td  booster every 10 years. Zoster vaccine. You may need this after age 62. Pneumococcal 13-valent conjugate (PCV13) vaccine. One dose is recommended after age 76. Pneumococcal polysaccharide (PPSV23) vaccine. One dose is recommended after age 38. Talk to your health care provider about which screenings and vaccines you need and how often you need them. This information is not intended to replace advice given to you by your health care provider. Make sure you discuss any questions you have with your health care provider. Document Released: 10/21/2015 Document Revised: 06/13/2016 Document Reviewed: 07/26/2015 Elsevier Interactive Patient Education  2017 [place] Prevention in the Home Falls can cause injuries. They can happen to people of all ages. There are many things you can do to make your home safe and to help prevent falls. What can I do on the outside of my home? Regularly fix the edges of walkways and driveways and fix any cracks. Remove anything that might make you trip as you walk through a door, such as a raised step or threshold. Trim any bushes or trees on the path to your home. Use bright outdoor lighting. Clear any walking paths of anything that might make someone trip, such as rocks or tools. Regularly check to see if handrails are loose or broken. Make sure that both sides of any steps have handrails. Any raised decks and porches should have guardrails on the edges. Have any leaves, snow, or ice cleared regularly. Use sand or salt on walking paths during winter. Clean up any spills in your garage right away. This includes oil or grease spills. What can I do in the bathroom? Use night lights. Install grab bars by the toilet and in the tub and shower. Do not use towel bars as grab bars. Use non-skid mats or decals in the tub or shower. If you need to sit down in the shower, use a plastic, non-slip stool. Keep the floor dry. Clean up any water that spills on the floor  as soon as it happens. Remove soap buildup in the tub or shower regularly. Attach bath mats securely with double-sided non-slip rug tape. Do not have throw rugs and other things on the floor that can make you trip. What can I do in the bedroom? Use night lights. Make sure that you have a light by your bed that is easy to reach. Do not use any sheets or blankets that are too big for your bed. They should not hang down onto the floor. Have a firm chair that has side arms. You can use this for support while you get dressed. Do not have throw rugs and other things on the floor that can make you trip. What can I do in the kitchen? Clean up any spills right away. Avoid walking on wet floors. Keep items that you use a lot in easy-to-reach places. If you need to reach something above you, use a strong step stool that has a grab bar. Keep electrical cords out of the way. Do not use floor polish or wax that makes floors slippery. If you must use wax, use non-skid floor wax. Do not have throw rugs and other things on the  floor that can make you trip. What can I do with my stairs? Do not leave any items on the stairs. Make sure that there are handrails on both sides of the stairs and use them. Fix handrails that are broken or loose. Make sure that handrails are as long as the stairways. Check any carpeting to make sure that it is firmly attached to the stairs. Fix any carpet that is loose or worn. Avoid having throw rugs at the top or bottom of the stairs. If you do have throw rugs, attach them to the floor with carpet tape. Make sure that you have a light switch at the top of the stairs and the bottom of the stairs. If you do not have them, ask someone to add them for you. What else can I do to help prevent falls? Wear shoes that: Do not have high heels. Have rubber bottoms. Are comfortable and fit you well. Are closed at the toe. Do not wear sandals. If you use a stepladder: Make sure that it is  fully opened. Do not climb a closed stepladder. Make sure that both sides of the stepladder are locked into place. Ask someone to hold it for you, if possible. Clearly mark and make sure that you can see: Any grab bars or handrails. First and last steps. Where the edge of each step is. Use tools that help you move around (mobility aids) if they are needed. These include: Canes. Walkers. Scooters. Crutches. Turn on the lights when you go into a dark area. Replace any light bulbs as soon as they burn out. Set up your furniture so you have a clear path. Avoid moving your furniture around. If any of your floors are uneven, fix them. If there are any pets around you, be aware of where they are. Review your medicines with your doctor. Some medicines can make you feel dizzy. This can increase your chance of falling. Ask your doctor what other things that you can do to help prevent falls. This information is not intended to replace advice given to you by your health care provider. Make sure you discuss any questions you have with your health care provider. Document Released: 07/21/2009 Document Revised: 03/01/2016 Document Reviewed: 10/29/2014 Elsevier Interactive Patient Education  2017 [place].   Opioid Pain Medicine Management Opioids are powerful medicines that are used to treat moderate to severe pain. When used for short periods of time, they can help you: Sleep better. Do better in physical or occupational therapy. Feel better in the first few days after an injury. Recover from surgery. Opioids should be taken with the supervision of a trained health care provider. They should be taken for the shortest period of time possible. This is because opioids can be addictive, and the longer you take opioids, the greater your risk of addiction (opioid use disorder). What are the risks? Using opioid pain medicines for longer than 3 days increases your risk of these side effects. Opioids can  cause side effects, such as: Constipation. Nausea. Vomiting. Drowsiness. Confusion. Opioid use disorder. Breathing difficulties (respiratory depression). Taking opioid pain medicine for a long period of time can affect your ability to do daily tasks. It also puts you at risk for: Motor vehicle accidents. Depression. Suicide. Heart attack. Overdose, which can sometimes lead to death. What is a pain treatment plan? A pain treatment plan is an agreement between you and your health care provider. Pain is unique to each person, and treatments vary depending on your  condition. To manage your pain successfully, you and your health care provider need to understand each other and work together. To help you do this: Discuss the goals of your treatment, including how much pain you might expect to have and how you will manage the pain. Review the risks and benefits of taking opioid medicines for your condition. Remember that a good treatment plan uses more than one approach and minimizes the chance of side effects. Be honest about the amount of medicines you take and about any drug or alcohol use. Get pain medicine prescriptions from only one health care provider. Pain can be managed with many types of alternative treatments. Ask your health care provider to refer you to one or more specialists who can help you manage pain through: Physical or occupational therapy. Counseling (cognitive behavioral therapy). Good nutrition. Biofeedback. Massage. Meditation. Non-opioid medicine. Following a gentle exercise program. Tapering your use of opioids If you have been taking opioid medicine for more than a few weeks, you may need to slowly decrease (taper) how much you take until you stop completely. Tapering your use of opioids can decrease your chances of experiencing withdrawal symptoms, such as: Pain and cramping in the abdomen. Nausea. Sweating. Sleepiness. Restlessness. Uncontrollable shaking  (tremors). Cravings for the medicine. Do not attempt to taper your use of opioids on your own. Talk with your health care provider about how to do this. Your health care provider may prescribe a step-down schedule based on how much medicine you are taking and how long youhave been taking it. Follow these instructions at home: Safety and storage  While you are taking opioid pain medicine: Do not drive. Do not use machinery or power tools. Do not sign legal documents. Do not drink alcohol. Do not take sleeping pills. Do not supervise children by yourself. Do not participate in activities that require climbing or being in high places. Do not enter a body of water, such as a lake, river, ocean, spa, or swimming pool. Keep pain medicine in a locked cabinet or in a secure area where children cannot reach it. Do not share your pain medicine with anyone.  Getting rid of leftover pills Do not save any leftover pills. Get rid of leftover pills safely by: Taking the medicine to a prescription take-back program. This is usually offered by the county or law enforcement. Bringing them to a pharmacy that has a drug disposal container. Flushing them down the toilet. Check the label or package insert of your medicine to see whether this is safe to do. Throwing them out in the trash. Check the label or package insert of your medicine to see whether this is safe to do. If it is safe to throw it out, remove the medicine from the original container, put it into a sealable bag or container, and mix it with used coffee grounds, food scraps, dirt, or cat litter before putting it in the trash. Activity Return to your normal activities as told by your health care provider. Ask your health care provider what activities are safe for you. Avoid activities that make your pain worse. Do exercises as told by your health care provider. General instructions You may need to take these actions to prevent or treat  constipation: Drink enough fluid to keep your urine pale yellow. Take over-the-counter or prescription medicines. Eat foods that are high in fiber, such as beans, whole grains, and fresh fruits and vegetables. Limit foods that are high in fat and processed sugars, such as  fried or sweet foods. Keep all follow-up visits as told by your health care provider. This is important. Where to find support If you have been taking opioids for a long time, you may benefit from receiving support for quitting from a local support group or counselor. Ask your healthcare provider for a referral to these resources in your area. Where to find more information Centers for Disease Control and Prevention (CDC): http://www.wolf.info/ [place] Food and Drug Administration (FDA): [place] Get help right away if: Seek medical care right away if you are taking opioids and you, or people close to you, notice any of the following: Difficulty breathing. Breathing that is slower or more shallow than normal. A very slow heartbeat (pulse). Severe confusion. Unconsciousness. Sleepiness. Slurred speech. Nausea and vomiting. Cold, clammy skin. Blue lips or fingernails. Limpness. Abnormally small pupils. If you think that you or someone else may have taken too much of an opioid medicine, get medical help right away. Call your local emergency services (911 in the [place] Do not drive yourself to the hospital. If you ever feel like you may hurt yourself or others, or have thoughts about taking your own life, get help right away. You can go to your nearest emergency department or call: Your local emergency services (911 in the [place] The hotline of the [facility] 786-849-2392 in the [place] A suicide crisis helpline, such as the [place] at 279 408 1202. This is open 24 hours a day. Summary Opioid medicines can help you manage moderate to severe pain for a short period of time. A  pain treatment plan is an agreement between you and your health care provider. Discuss the goals of your treatment, including how much pain you might expect to have and how you will manage the pain. Pain can be managed with many types of alternative treatments. If you think that you or someone else may have taken too much of an opioid, get medical help right away. This information is not intended to replace advice given to you by your health care provider. Make sure you discuss any questions you have with your healthcare provider. Document Revised: 07/31/2019 Document Reviewed: 10/24/2018 Elsevier Patient Education  [place].

## 2021-05-29 NOTE — Progress Notes (Signed)
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  Review of Systems    No ROS.  Medicare Wellness Virtual Visit.  Visual/audio telehealth visit, UTA vital signs.   See social history for additional risk factors.   Cardiac Risk Factors include: advanced age (>77mn, >>27women)     Objective:    Today's Vitals   05/29/21 1235  Weight: 182 lb (82.6 kg)  Height: '5\' 2"'$  (1.575 m)   Body mass index is 33.29 kg/m.  Advanced Directives 05/29/2021 09/01/2020 05/26/2020 03/19/2020 03/17/2020 03/16/2020 05/26/2019  Does Patient Have a Medical Advance Directive? Yes Yes Yes Yes Yes Yes Yes  Type of Advance Directive Healthcare Power of [place] DNR (pink MOST or yellow form);Healthcare Power of [place] will - Out of facility DNR (pink MOST or yellow form) Out of facility DNR (pink MOST or yellow form) Healthcare Power of Attorney  Does patient want to make changes to medical advance directive? No - Patient declined - No - Patient declined - No - Patient declined - No - Patient declined  Copy of [place]? No - copy requested - No - copy requested - - - No - copy requested  Would patient like information on creating a medical advance directive? - - - - - - -  Pre-existing out of facility DNR order (yellow form or pink MOST form) - Yellow form placed in chart (order not valid for inpatient use) - - - - -    Current Medications (verified) Outpatient Encounter Medications as of 05/29/2021  Medication Sig   acetaminophen (TYLENOL) 500 MG tablet Take 1,000 mg by mouth 2 (two) times daily.   amitriptyline (ELAVIL) 25 MG tablet TAKE 1 TABLET BY MOUTH AT BEDTIME   b complex vitamins tablet Take 1 tablet by mouth daily.   Cholecalciferol (VITAMIN D3) 2000 UNITS TABS Take 2,000 Units by mouth 2 (two) times a week. Tuesday and Saturdays.   clotrimazole  (MYCELEX) 10 MG troche Take 10 mg by mouth as needed.   Coenzyme Q10 (COQ-10) 200 MG CAPS Take 200 mg by mouth daily.    conjugated estrogens (PREMARIN) vaginal cream Apply 0.'5mg'$  (pea-sized amount)  just inside the vaginal introitus with a finger-tip on  Monday, Wednesday and Friday nights. (Patient taking differently: Apply 0.'5mg'$  (pea-sized amount)  just inside the vaginal introitus with a finger-tip on  Monday, Wednesday and Friday nights.)   CRANBERRY PO Take 1 capsule by mouth daily.   ELIQUIS 5 MG TABS tablet TAKE 1 TABLET BY MOUTH TWICE DAILY   ezetimibe (ZETIA) 10 MG tablet TAKE 1 TABLET BY MOUTH DAILY   furosemide (LASIX) 40 MG tablet Take 40 mg tablet in the am and 20 mg in the pm   hydrocortisone 2.5 % cream APPLY TO LICHEN PLANUS AS DIRECTED   Iron, Ferrous Sulfate, 325 (65 Fe) MG TABS Take 325 mg by mouth daily.   loratadine (CLARITIN) 10 MG tablet Take 10 mg by mouth daily.   meclizine (ANTIVERT) 25 MG tablet Take 1 tablet (25 mg total) by mouth 3 (three) times daily as needed for dizziness.   Multiple Vitamins-Minerals (HAIR/SKIN/NAILS PO) Take 1 tablet by mouth daily.   mupirocin ointment (BACTROBAN) 2 % Apply to affected area bid   pantoprazole (PROTONIX) 40 MG tablet TAKE 1 TABLET BY MOUTH TWICE DAILY   polyethylene glycol powder (GLYCOLAX/MIRALAX) 17 GM/SCOOP powder Take  8.5 g by mouth daily. Hold if diarrhea   ramipril (ALTACE) 5 MG capsule TAKE 1 CAPSULE(5 MG) BY MOUTH DAILY   REPATHA SURECLICK [id number] MG/ML SOAJ INJECT 1 PEN EVERY 14 DAYS AS DIRECTED   spironolactone (ALDACTONE) 25 MG tablet TAKE 1 TABLET BY MOUTH EVERY MORNING   traMADol (ULTRAM) 50 MG tablet TAKE ONE TABLET TWICE DAILY AS NEEDED   traZODone (DESYREL) 150 MG tablet Take two tablets by mouth at bedtime as needed for sleep.   triamcinolone cream (KENALOG) 0.1 % Apply topically 2 (two) times daily.   verapamil (CALAN-SR) 180 MG CR tablet TAKE ONE (1) TABLET BY MOUTH TWO TIMES PER DAY   vitamin B-12  (CYANOCOBALAMIN) 1000 MCG tablet Take 1,000 mcg by mouth daily.   vitamin C (ASCORBIC ACID) 500 MG tablet Take 500 mg by mouth at bedtime.   No facility-administered encounter medications on file as of 05/29/2021.    Allergies (verified) Beta adrenergic blockers; Brilinta [ticagrelor]; Albuterol; Antihistamines, diphenhydramine-type; Aspirin; Nsaids; Penicillins; Statins; Sulfa antibiotics; Sulfasalazine; Tetracycline; and Tetracyclines & related   History: Past Medical History:  Diagnosis Date   Allergy    Arthritis    s/p bilateral knees and left hip replacement   Asthma    CAD (coronary artery disease)    a. 12/1996 s/p CABG x4 [facility] [place]);  b. 2005 Pt reports stress test & cath, which revealed patent grafts.   Cancer [facility])    melanoma right arm   Carotid arterial disease ([place])    a. 04/2015 Carotid U/S: <50% bilat ICA stenosis.   Chronic kidney disease    Colon polyps    H/O   Depression    GERD (gastroesophageal reflux disease)    h/o hiatal hernia   Headache    migraines in past   Heart murmur    a. 04/2011 Echo: EF 55-60%, bilat atrial enlargement, mild to mod TR.   History of chicken pox    History of hiatal hernia    Hx of migraines    rare now   Hx: UTI (urinary tract infection)    Hyperlipidemia    a. Statin intolerant -->on zetia.   Hypertension    Hypertensive heart disease    Lichen planus    Melanoma (HCC)    Palpitations    a. rare PVC's and h/o SVT.   PMR (polymyalgia rheumatica) (HCC)    h/o in setting of crestor usage.   PSVT (paroxysmal supraventricular tachycardia) (HCC)    PUD (peptic ulcer disease)    remote history   Raynaud's phenomenon    Spinal stenosis    Urine incontinence    H/O   Vitamin D deficiency    Past Surgical History:  Procedure Laterality Date   ADENOIDECTOMY     age 75   BACK SURGERY     L3-L5   BILATERAL CARPAL TUNNEL RELEASE     BREAST BIOPSY Right    bx x 3 neg   BREAST SURGERY Right    biopsy x 3 (all  benign)   CARDIAC CATHETERIZATION N/A 06/01/2016   Procedure: LEFT HEART CATH AND CORS/GRAFTS ANGIOGRAPHY;  Surgeon: Wellington Hampshire, MD;  Location: [place] CV [facility];  Service: Cardiovascular;  Laterality: N/A;   CARDIAC CATHETERIZATION N/A 06/01/2016   Procedure: Coronary Stent Intervention;  Surgeon: Wellington Hampshire, MD;  Location: [place] [facility];  Service: Cardiovascular;  Laterality: N/A;   CARDIOVERSION N/A 03/08/2017   Procedure: CARDIOVERSION;  Surgeon: Wellington Hampshire, MD;  Location: [facility]  ORS;  Service: Cardiovascular;  Laterality: N/A;   CATARACT EXTRACTION W/PHACO Right 01/04/2016   Procedure: CATARACT EXTRACTION PHACO AND INTRAOCULAR LENS PLACEMENT (IOC);  Surgeon: Leandrew Koyanagi, MD;  Location: [place];  Service: Ophthalmology;  Laterality: Right;  MALYUGIN   CATARACT EXTRACTION W/PHACO Left 01/25/2016   Procedure: CATARACT EXTRACTION PHACO AND INTRAOCULAR LENS PLACEMENT ([place]) left eye;  Surgeon: Leandrew Koyanagi, MD;  Location: [place];  Service: Ophthalmology;  Laterality: Left;  MALYUGIN SHUGARCAINE   CHOLECYSTECTOMY  90's   COLONOSCOPY  03/19/2020   Procedure: COLONOSCOPY;  Surgeon: Virgel Manifold, MD;  Location: [facility] ENDOSCOPY;  Service: Gastroenterology;;   COLONOSCOPY WITH PROPOFOL N/A 05/03/2015   Procedure: COLONOSCOPY WITH PROPOFOL;  Surgeon: Lollie Sails, MD;  Location: [facility] ENDOSCOPY;  Service: Endoscopy;  Laterality: N/A;   COLONOSCOPY WITH PROPOFOL N/A 03/19/2020   Procedure: COLONOSCOPY WITH PROPOFOL;  Surgeon: Virgel Manifold, MD;  Location: [facility] ENDOSCOPY;  Service: Endoscopy;  Laterality: N/A;   CORONARY ARTERY BYPASS GRAFT  98   4 vessel   CORONARY ARTERY BYPASS GRAFT     x 4 age 56 per pt    CORONARY STENT INTERVENTION N/A 03/31/2018   Procedure: CORONARY STENT INTERVENTION;  Surgeon: Wellington Hampshire, MD;  Location: [place] [facility];  Service: Cardiovascular;  Laterality: N/A;    ESOPHAGOGASTRODUODENOSCOPY N/A 03/22/2015   Procedure: ESOPHAGOGASTRODUODENOSCOPY (EGD);  Surgeon: Lollie Sails, MD;  Location: [facility] ENDOSCOPY;  Service: Endoscopy;  Laterality: N/A;   ESOPHAGOGASTRODUODENOSCOPY N/A 03/19/2020   Procedure: ESOPHAGOGASTRODUODENOSCOPY (EGD);  Surgeon: Virgel Manifold, MD;  Location: [facility] ENDOSCOPY;  Service: Endoscopy;  Laterality: N/A;   HEMORRHOID BANDING     HEMORRHOID SURGERY N/A 02/17/2018   Procedure: HEMORRHOIDECTOMY;  Surgeon: Robert Bellow, MD;  Location: [facility] ORS;  Service: General;  Laterality: N/A;   JOINT REPLACEMENT     BILATERAL KNEE REPLACEMENTS   KNEE ARTHROSCOPY W/ OATS PROCEDURE     Lt knee (9/01), Rt knee (3/11), Lt hip (5/10)   LEFT HEART CATH AND CORONARY ANGIOGRAPHY N/A 03/31/2018   Procedure: LEFT HEART CATH AND CORONARY ANGIOGRAPHY;  Surgeon: Wellington Hampshire, MD;  Location: [place] [facility];  Service: Cardiovascular;  Laterality: N/A;   TOTAL HIP ARTHROPLASTY     Family History  Problem Relation Age of Onset   Thyroid disease Mother        graves disease   Heart disease Father        rheumatic heart   Alcohol abuse Father    Depression Father    Lung cancer Sister    Depression Daughter    Thyroid disease Daughter        hashimoto   Depression Son    Stroke Maternal Grandmother    Depression Maternal Grandmother    Hypertension Maternal Grandfather    Hypertension Paternal Grandfather    Seizures Son    Breast cancer Neg Hx    Social History   Socioeconomic History   Marital status: Divorced    Spouse name: Not on file   Number of children: 3   Years of education: Not on file   Highest education level: Not on file  Occupational History   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  Retired Engineer, drilling.  Relatively active.   Uses a Rollator to ambulate   Social Determinants of Health   Financial Resource Strain: Low Risk    Difficulty of Paying Living Expenses: Not hard at all  Food Insecurity: No Food Insecurity   Worried About Charity fundraiser in the Last Year: Never true   Arboriculturist in the Last Year: Never true  Transportation Needs: No Transportation Needs   Lack of Transportation (Medical): No   Lack of Transportation (Non-Medical): No  Physical Activity: Sufficiently Active   Days of Exercise per Week: 5 days   Minutes of Exercise per Session: 30 min  Stress: No Stress Concern Present   Feeling of Stress : Not at all  Social Connections: Unknown   Frequency of Communication with Friends and Family: More than three times a week   Frequency of Social Gatherings with Friends and Family: Twice a week   Attends Religious Services: Not on Electrical engineer or Organizations: Not on file   Attends Archivist Meetings: Not on file   Marital Status: Not on file    Tobacco Counseling Counseling given: Not Answered Tobacco comments: quit 47+ yrs ago   Clinical Intake:  Pre-visit preparation completed: Yes        Diabetes: No  How often do you need to have someone help you when you read instructions, pamphlets, or other written materials from your doctor or pharmacy?: 1 - Never  Interpreter Needed?: No      Activities of Daily Living In your present state of health, do you have any difficulty performing the following activities: 05/29/2021  Hearing? N  Vision? N  Difficulty concentrating or making decisions? N  Walking or climbing stairs? Y  Dressing or bathing? N  Doing errands, shopping? Y  Preparing Food and eating ? Y  Comment Staff assist with meal  prep. Self feeds.  Using the Toilet? N  In the past six months, have you accidently leaked urine? Y  Comment Managed with daily depend brief  Do you have problems with loss of bowel control? N  Managing your Medications? Y  Comment Staff assist  Managing your Finances? Y  Comment Son Writer or managing your Housekeeping? Y  Comment Maid assist  Some recent data might be hidden    Patient Care Team: [name], MD as PCP - General (Internal Medicine) Wende Bushy, MD as Consulting Physician (Cardiology) Clent Jacks, RN as Registered Nurse Rockey Situ, Kathlene November, MD as Consulting Physician (Cardiology)  Indicate any recent Medical Services you may have received from other than [facility] providers in the past year (date may be approximate).     Assessment:   This is a routine wellness examination for [place].  I connected with Rusty today by telephone and verified that I am speaking with the correct person using two identifiers. Location patient: home Location provider: work Persons participating in the virtual visit: patient, Marine scientist.    I discussed the limitations, risks, security and privacy concerns of performing an evaluation and management service by telephone and the availability of in person appointments. The patient expressed understanding and verbally consented to this telephonic visit.    Interactive audio and video telecommunications were attempted  between this provider and patient, however failed, due to patient having technical difficulties OR patient did not have access to video capability.  We continued and completed visit with audio only.  Some vital signs may be absent or patient reported.   Hearing/Vision screen Hearing Screening - Comments:: Patient is able to hear conversational tones without difficulty. No issues reported. Vision Screening - Comments:: Follow[facility]  Wears corrective lenses  Cataract extraction, bilateral   They have regular follow up with the ophthalmologist  Dietary issues and exercise activities discussed: Current Exercise Habits: Home exercise routine, Type of exercise: stretching;walking, Time (Minutes): 30, Frequency (Times/Week): 5, Weekly Exercise (Minutes/Week): 150, Intensity: Mild Low sodium diet Good water intake    Goals Addressed               This Visit's Progress     Patient Stated     Monitor sodium intake (pt-stated)   On track      Depression Screen PHQ 2/9 Scores 05/29/2021 11/18/2020 05/26/2020 05/26/2019 03/05/2018 03/11/2017 11/19/2016  PHQ - 2 Score 0 0 0 0 0 1 1  PHQ- 9 Score - - - - - 3 4    Fall Risk Fall Risk  05/29/2021 11/18/2020 05/26/2020 05/26/2019 05/07/2019  Falls in the past year? 0 0 0 0 0  Comment - - - - Emmi Telephone Survey: data to providers prior to load  Number falls in past yr: - 0 0 - -  Injury with Fall? 0 0 - - -  Risk Factor Category  - - - - -  Risk for fall due to : - History of fall(s) - - -  Follow up Falls evaluation completed Falls evaluation completed Falls evaluation completed - -    FALL RISK PREVENTION PERTAINING TO THE HOME: Adequate lighting in your home to reduce risk of falls? Yes   ASSISTIVE DEVICES UTILIZED TO PREVENT FALLS: Life alert? Yes  Use of a cane, walker or w/c? Yes  Grab bars in the bathroom? Yes  Shower chair or bench in shower? Yes  Elevated toilet seat or a handicapped toilet? Yes   TIMED UP AND GO: Was the test performed? No .   Cognitive Function: Patient is alert and oriented x3.  MMSE - Mini Mental State Exam 03/11/2017 02/23/2016  Orientation to time 5 5  Orientation to Place 5 5  Registration 3 3  Attention/ Calculation 5 5  Recall 3 3  Language- name 2 objects 2 2  Language- repeat 1 1  Language- follow 3 step command 3 3  Language- read & follow direction 1 1  Write a sentence 1 1  Copy design 1 1  Total score 30 30     6CIT Screen 05/29/2021 05/26/2020 05/26/2019 03/18/2018  What  Year? 0 points 0 points 0 points 0 points  What month? 0 points 0 points 0 points 0 points  What time? 0 points 0 points 0 points 0 points  Count back from 20 0 points - 0 points 0 points  Months in reverse 0 points - 0 points 0 points  Repeat phrase - - 0 points 0 points  Total Score - - 0 0    Immunizations Immunization History  Administered Date(s) Administered   Influenza, High Dose Seasonal PF 06/14/2016, 07/08/2018, 07/16/2019   Influenza,inj,Quad PF,6+ Mos 06/15/2014   Influenza-Unspecified 07/13/2015   Moderna Sars-Covid-2 Vaccination 10/22/2019, 11/19/2019, 08/19/2020, 02/24/2021   Pneumococcal Conjugate-13 01/13/2015   Pneumococcal Polysaccharide-23 06/21/2009   Tdap  02/22/2010   Zoster, Live 11/15/2010    TDAP status: Due, Education has been provided regarding the importance of this vaccine. Advised may receive this vaccine at local pharmacy or Health Dept. Aware to provide a copy of the vaccination record if obtained from local pharmacy or Health Dept. Verbalized acceptance and understanding. Deferred.   Health Maintenance Health Maintenance  Topic Date Due   INFLUENZA VACCINE  07/15/2021 (Originally 05/08/2021)   Zoster Vaccines- Shingrix (1 of 2) 08/29/2021 (Originally 04/07/1955)   MAMMOGRAM  05/29/2022 (Originally 12/18/2019)   DEXA SCAN  05/29/2022 (Originally 04/08/2003)   TETANUS/TDAP  05/29/2022 (Originally 02/23/2020)   COVID-19 Vaccine (5 - Booster for Moderna series) 06/27/2021   COLONOSCOPY (Pts 45-68yr Insurance coverage will need to be confirmed)  03/19/2025   PNA vac Low Risk Adult  Completed   HPV VACCINES  Aged Out   Shingles vaccine- deferred per patient.  Mammogram- declined.   Bone density- declined.   Lung Cancer Screening: (Low Dose CT Chest recommended if Age 83-80years, 30 pack-year currently smoking OR have quit w/in 15years.) does not qualify.   Hepatitis C Screening: does not qualify  Vision Screening: Recommended annual ophthalmology  exams for early detection of glaucoma and other disorders of the eye.  Dental Screening: Recommended annual dental exams for proper oral hygiene  Community Resource Referral / Chronic Care Management: CRR required this visit?  No   CCM required this visit?  No      Plan:    I have personally reviewed and noted the following in the patient's chart:   Medical and social history Use of alcohol, tobacco or illicit drugs  Current medications and supplements including opioid prescriptions. Taking tramadol, followed by pcp.  Functional ability and status Nutritional status Physical activity Advanced directives List of other physicians Hospitalizations, surgeries, and ER visits in previous 12 months Vitals Screenings to include cognitive, depression, and falls Referrals and appointments  In addition, I have reviewed and discussed with patient certain preventive protocols, quality metrics, and best practice recommendations. A written personalized care plan for preventive services as well as general preventive health recommendations were provided to patient via mail.     OVarney Biles LPN   [id number]

## 2021-06-07 ENCOUNTER — Other Ambulatory Visit: Payer: Self-pay | Admitting: Cardiovascular Disease

## 2021-06-13 ENCOUNTER — Ambulatory Visit: Payer: Medicare Other | Admitting: Cardiovascular Disease

## 2021-06-19 ENCOUNTER — Other Ambulatory Visit: Payer: Self-pay | Admitting: Internal Medicine

## 2021-06-19 NOTE — Telephone Encounter (Signed)
RX refill: tramadol Last Seen:11-18-20 Last ordered:05-05-21

## 2021-06-20 NOTE — Telephone Encounter (Signed)
Rx ok'd for tramadol #60 with no refills.

## 2021-06-22 ENCOUNTER — Other Ambulatory Visit: Payer: Self-pay

## 2021-06-22 ENCOUNTER — Ambulatory Visit (INDEPENDENT_AMBULATORY_CARE_PROVIDER_SITE_OTHER): Payer: Medicare Other | Admitting: Internal Medicine

## 2021-06-22 VITALS — BP 140/78 | HR 87 | Temp 97.6°F | Resp 16 | Ht 62.0 in | Wt 186.0 lb

## 2021-06-22 DIAGNOSIS — J4520 Mild intermittent asthma, uncomplicated: Secondary | ICD-10-CM

## 2021-06-22 DIAGNOSIS — D649 Anemia, unspecified: Secondary | ICD-10-CM | POA: Diagnosis not present

## 2021-06-22 DIAGNOSIS — Z23 Encounter for immunization: Secondary | ICD-10-CM | POA: Diagnosis not present

## 2021-06-22 DIAGNOSIS — R531 Weakness: Secondary | ICD-10-CM | POA: Diagnosis not present

## 2021-06-22 DIAGNOSIS — G629 Polyneuropathy, unspecified: Secondary | ICD-10-CM | POA: Diagnosis not present

## 2021-06-22 DIAGNOSIS — I4819 Other persistent atrial fibrillation: Secondary | ICD-10-CM

## 2021-06-22 DIAGNOSIS — K623 Rectal prolapse: Secondary | ICD-10-CM | POA: Diagnosis not present

## 2021-06-22 DIAGNOSIS — I5032 Chronic diastolic (congestive) heart failure: Secondary | ICD-10-CM

## 2021-06-22 DIAGNOSIS — R739 Hyperglycemia, unspecified: Secondary | ICD-10-CM | POA: Diagnosis not present

## 2021-06-22 DIAGNOSIS — Z86010 Personal history of colonic polyps: Secondary | ICD-10-CM | POA: Diagnosis not present

## 2021-06-22 DIAGNOSIS — I10 Essential (primary) hypertension: Secondary | ICD-10-CM | POA: Diagnosis not present

## 2021-06-22 DIAGNOSIS — E7800 Pure hypercholesterolemia, unspecified: Secondary | ICD-10-CM

## 2021-06-22 DIAGNOSIS — I25118 Atherosclerotic heart disease of native coronary artery with other forms of angina pectoris: Secondary | ICD-10-CM

## 2021-06-22 LAB — BASIC METABOLIC PANEL
BUN: 27 mg/dL — ABNORMAL HIGH (ref 6–23)
CO2: 30 mEq/L (ref 19–32)
Calcium: 10.1 mg/dL (ref 8.4–10.5)
Chloride: 98 mEq/L (ref 96–112)
Creatinine, Ser: 1.15 mg/dL (ref 0.40–1.20)
GFR: 44.15 mL/min — ABNORMAL LOW (ref >60.00)
Glucose, Bld: 125 mg/dL — ABNORMAL HIGH (ref 70–99)
Potassium: 3.7 mEq/L (ref 3.5–5.1)
Sodium: 137 mEq/L (ref 135–145)

## 2021-06-22 LAB — CBC WITH DIFFERENTIAL/PLATELET
Basophils Absolute: 0 10*3/uL (ref 0.0–0.1)
Basophils Relative: 0.4 % (ref 0.0–3.0)
Eosinophils Absolute: 0.1 10*3/uL (ref 0.0–0.7)
Eosinophils Relative: 1 % (ref 0.0–5.0)
HCT: 39.2 % (ref 36.0–46.0)
Hemoglobin: 12.8 g/dL (ref 12.0–15.0)
Lymphocytes Relative: 14.2 % (ref 12.0–46.0)
Lymphs Abs: 0.7 10*3/uL (ref 0.7–4.0)
MCHC: 32.7 g/dL (ref 30.0–36.0)
MCV: 99.9 fl (ref 78.0–100.0)
Monocytes Absolute: 0.6 10*3/uL (ref 0.1–1.0)
Monocytes Relative: 11.6 % (ref 3.0–12.0)
Neutro Abs: 3.6 10*3/uL (ref 1.4–7.7)
Neutrophils Relative %: 72.8 % (ref 43.0–77.0)
Platelets: 234 10*3/uL (ref 150.0–400.0)
RBC: 3.92 Mil/uL (ref 3.87–5.11)
RDW: 14.3 % (ref 11.5–15.5)
WBC: 5 10*3/uL (ref 4.0–10.5)

## 2021-06-22 LAB — LIPID PANEL
Cholesterol: 143 mg/dL (ref 0–200)
HDL: 60 mg/dL (ref >39.00)
LDL Cholesterol: 44 mg/dL (ref 0–99)
NonHDL: 83.21
Total CHOL/HDL Ratio: 2
Triglycerides: 197 mg/dL — ABNORMAL HIGH (ref 0.0–149.0)
VLDL: 39.4 mg/dL (ref 0.0–40.0)

## 2021-06-22 LAB — HEPATIC FUNCTION PANEL
ALT: 20 U/L (ref 0–35)
AST: 22 U/L (ref 0–37)
Albumin: 4.1 g/dL (ref 3.5–5.2)
Alkaline Phosphatase: 55 U/L (ref 39–117)
Bilirubin, Direct: 0.1 mg/dL (ref 0.0–0.3)
Total Bilirubin: 0.4 mg/dL (ref 0.2–1.2)
Total Protein: 6.5 g/dL (ref 6.0–8.3)

## 2021-06-22 LAB — HEMOGLOBIN A1C: Hgb A1c MFr Bld: 6.3 % (ref 4.6–6.5)

## 2021-06-22 LAB — TSH: TSH: 1.36 u[IU]/mL (ref 0.35–5.50)

## 2021-06-22 LAB — FERRITIN: Ferritin: 29.5 ng/mL (ref 10.0–291.0)

## 2021-06-22 NOTE — Progress Notes (Signed)
Patient ID: Charlotte Sessions, Charlotte Wagner, female   DOB: 08-08-1936, 85 y.o.   MRN: [id number]   Subjective:    Patient ID: Charlotte Sessions, Charlotte Wagner, female    DOB: 1936-02-16, 85 y.o.   MRN: [id number]  This visit occurred during the SARS-CoV-2 public health emergency.  Safety protocols were in place, including screening questions prior to the visit, additional usage of staff PPE, and extensive cleaning of exam room while observing appropriate contact time as indicated for disinfecting solutions.   Patient here for a scheduled follow up.   Chief Complaint  Patient presents with   Hypertension   Gastroesophageal Reflux   Hyperlipidemia   .   HPI Here to follow up regarding her blood pressure, cholesterol, afib and CHF.  On repatha.  Taking lasix. Breathing relatively stable overall.  Legs weak.  Uses her walker.  Does try to walk daily.  Has issues with sciatica.  Eating.  No nausea or vomiting.  Has what sounds like rectal prolapse with BM.     Past Medical History:  Diagnosis Date   Allergy    Arthritis    s/p bilateral knees and left hip replacement   Asthma    CAD (coronary artery disease)    a. 12/1996 s/p CABG x4 [facility] [place]);  b. 2005 Pt reports stress test & cath, which revealed patent grafts.   Cancer [facility])    melanoma right arm   Carotid arterial disease ([place])    a. 04/2015 Carotid U/S: <50% bilat ICA stenosis.   Chronic kidney disease    Colon polyps    H/O   Depression    GERD (gastroesophageal reflux disease)    h/o hiatal hernia   Headache    migraines in past   Heart murmur    a. 04/2011 Echo: EF 55-60%, bilat atrial enlargement, mild to mod TR.   History of chicken pox    History of hiatal hernia    Hx of migraines    rare now   Hx: UTI (urinary tract infection)    Hyperlipidemia    a. Statin intolerant -->on zetia.   Hypertension    Hypertensive heart disease    Lichen planus    Melanoma (HCC)    Palpitations    a. rare PVC's and h/o SVT.   PMR (polymyalgia  rheumatica) (HCC)    h/o in setting of crestor usage.   PSVT (paroxysmal supraventricular tachycardia) (HCC)    PUD (peptic ulcer disease)    remote history   Raynaud's phenomenon    Spinal stenosis    Urine incontinence    H/O   Vitamin D deficiency    Past Surgical History:  Procedure Laterality Date   ADENOIDECTOMY     age 41   BACK SURGERY     L3-L5   BILATERAL CARPAL TUNNEL RELEASE     BREAST BIOPSY Right    bx x 3 neg   BREAST SURGERY Right    biopsy x 3 (all benign)   CARDIAC CATHETERIZATION N/A 06/01/2016   Procedure: LEFT HEART CATH AND CORS/GRAFTS ANGIOGRAPHY;  Surgeon: Wellington Hampshire, Charlotte Wagner;  Location: [place] [facility];  Service: Cardiovascular;  Laterality: N/A;   CARDIAC CATHETERIZATION N/A 06/01/2016   Procedure: Coronary Stent Intervention;  Surgeon: Wellington Hampshire, Charlotte Wagner;  Location: [place] [facility];  Service: Cardiovascular;  Laterality: N/A;   CARDIOVERSION N/A 03/08/2017   Procedure: CARDIOVERSION;  Surgeon: Wellington Hampshire, Charlotte Wagner;  Location: [facility] ORS;  Service: Cardiovascular;  Laterality:  N/A;   CATARACT EXTRACTION W/PHACO Right 01/04/2016   Procedure: CATARACT EXTRACTION PHACO AND INTRAOCULAR LENS PLACEMENT (IOC);  Surgeon: Leandrew Koyanagi, Charlotte Wagner;  Location: [place];  Service: Ophthalmology;  Laterality: Right;  MALYUGIN   CATARACT EXTRACTION W/PHACO Left 01/25/2016   Procedure: CATARACT EXTRACTION PHACO AND INTRAOCULAR LENS PLACEMENT ([place]) left eye;  Surgeon: Leandrew Koyanagi, Charlotte Wagner;  Location: [place];  Service: Ophthalmology;  Laterality: Left;  MALYUGIN SHUGARCAINE   CHOLECYSTECTOMY  90's   COLONOSCOPY  03/19/2020   Procedure: COLONOSCOPY;  Surgeon: Virgel Manifold, Charlotte Wagner;  Location: [facility] ENDOSCOPY;  Service: Gastroenterology;;   COLONOSCOPY WITH PROPOFOL N/A 05/03/2015   Procedure: COLONOSCOPY WITH PROPOFOL;  Surgeon: Lollie Sails, Charlotte Wagner;  Location: [facility] ENDOSCOPY;  Service: Endoscopy;  Laterality: N/A;   COLONOSCOPY WITH  PROPOFOL N/A 03/19/2020   Procedure: COLONOSCOPY WITH PROPOFOL;  Surgeon: Virgel Manifold, Charlotte Wagner;  Location: [facility] ENDOSCOPY;  Service: Endoscopy;  Laterality: N/A;   CORONARY ARTERY BYPASS GRAFT  98   4 vessel   CORONARY ARTERY BYPASS GRAFT     x 4 age 77 per pt    CORONARY STENT INTERVENTION N/A 03/31/2018   Procedure: CORONARY STENT INTERVENTION;  Surgeon: Wellington Hampshire, Charlotte Wagner;  Location: [place] [facility];  Service: Cardiovascular;  Laterality: N/A;   ESOPHAGOGASTRODUODENOSCOPY N/A 03/22/2015   Procedure: ESOPHAGOGASTRODUODENOSCOPY (EGD);  Surgeon: Lollie Sails, Charlotte Wagner;  Location: [facility] ENDOSCOPY;  Service: Endoscopy;  Laterality: N/A;   ESOPHAGOGASTRODUODENOSCOPY N/A 03/19/2020   Procedure: ESOPHAGOGASTRODUODENOSCOPY (EGD);  Surgeon: Virgel Manifold, Charlotte Wagner;  Location: [facility] ENDOSCOPY;  Service: Endoscopy;  Laterality: N/A;   HEMORRHOID BANDING     HEMORRHOID SURGERY N/A 02/17/2018   Procedure: HEMORRHOIDECTOMY;  Surgeon: Robert Bellow, Charlotte Wagner;  Location: [facility];  Service: General;  Laterality: N/A;   JOINT REPLACEMENT     BILATERAL KNEE REPLACEMENTS   KNEE ARTHROSCOPY W/ OATS PROCEDURE     Lt knee (9/01), Rt knee (3/11), Lt hip (5/10)   LEFT HEART CATH AND CORONARY ANGIOGRAPHY N/A 03/31/2018   Procedure: LEFT HEART CATH AND CORONARY ANGIOGRAPHY;  Surgeon: Wellington Hampshire, Charlotte Wagner;  Location: [place] [facility];  Service: Cardiovascular;  Laterality: N/A;   TOTAL HIP ARTHROPLASTY     Family History  Problem Relation Age of Onset   Thyroid disease Mother        graves disease   Heart disease Father        rheumatic heart   Alcohol abuse Father    Depression Father    Lung cancer Sister    Depression Daughter    Thyroid disease Daughter        hashimoto   Depression Son    Stroke Maternal Grandmother    Depression Maternal Grandmother    Hypertension Maternal Grandfather    Hypertension Paternal Grandfather    Seizures Son    Breast cancer Neg Hx    Social History    Socioeconomic History   Marital status: Divorced    Spouse name: Not on file   Number of children: 3   Years of education: Not on file   Highest education level: Not on file  Occupational History   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  Retired Engineer, drilling.  Relatively active.   Uses a Rollator to ambulate   Social Determinants of Health   Financial Resource Strain: Low Risk    Difficulty of Paying Living Expenses: Not hard at all  Food Insecurity: No Food Insecurity   Worried About Charity fundraiser in the Last Year: Never true   Arboriculturist in the Last Year: Never true  Transportation Needs: No Transportation Needs   Lack of Transportation (Medical): No   Lack of Transportation (Non-Medical): No  Physical Activity: Sufficiently Active   Days of Exercise per Week: 5 days   Minutes of Exercise per Session: 30 min  Stress: No Stress Concern Present   Feeling of Stress : Not at all  Social Connections: Unknown   Frequency of Communication with Friends and Family: More than three times a week   Frequency of Social Gatherings with Friends and Family: Twice a week   Attends Religious Services: Not on Electrical engineer or Organizations: Not on file   Attends Archivist Meetings: Not on file   Marital Status: Not on file     Review of Systems  Constitutional:  Positive for fatigue. Negative for appetite change.  HENT:  Negative for congestion and sinus pressure.   Respiratory:  Negative for cough and chest tightness.        Breathing relatively stable.   Cardiovascular:   Negative for chest pain and palpitations.       No increased swelling.  Stable.   Gastrointestinal:  Negative for abdominal pain, diarrhea, nausea and vomiting.  Genitourinary:  Negative for difficulty urinating and dysuria.  Musculoskeletal:  Negative for joint swelling and myalgias.       Chronic pain with back/legs  Skin:  Negative for color change and rash.  Neurological:  Negative for dizziness and headaches.  Psychiatric/Behavioral:  Negative for agitation and dysphoric mood.       Objective:     BP 140/78   Pulse 87   Temp 97.6 F (36.4 C)   Resp 16   Wt 186 lb (84.4 kg)   SpO2 99%   BMI 34.02 kg/m  Wt Readings from Last 3 Encounters:  06/22/21 186 lb (84.4 kg)  05/29/21 182 lb (82.6 kg)  04/05/21 182 lb (82.6 kg)    Physical Exam Vitals reviewed.  Constitutional:      General: She is not in acute distress.    Appearance: Normal appearance.  HENT:     Head: Normocephalic and atraumatic.     Right Ear: External ear normal.     Left Ear: External ear normal.  Eyes:     General: No scleral icterus.       Right eye: No discharge.        Left eye: No discharge.     Conjunctiva/sclera: Conjunctivae normal.  Neck:     Thyroid: No thyromegaly.  Cardiovascular:     Rate and Rhythm: Normal rate and regular rhythm.  Pulmonary:     Effort: No respiratory distress.     Breath sounds: Normal breath sounds. No wheezing.  Abdominal:     General: Bowel sounds are normal.     Palpations: Abdomen is soft.     Tenderness: There is no abdominal tenderness.  Musculoskeletal:  General: No tenderness.     Cervical back: Neck supple. No tenderness.     Comments: No increased swelling.   Lymphadenopathy:     Cervical: No cervical adenopathy.  Skin:    Findings: No erythema or rash.  Neurological:     Mental Status: She is alert.  Psychiatric:        Mood and Affect: Mood normal.        Behavior: Behavior normal.     Outpatient Encounter Medications as of  06/22/2021  Medication Sig   acetaminophen (TYLENOL) 500 MG tablet Take 1,000 mg by mouth 2 (two) times daily.   amitriptyline (ELAVIL) 25 MG tablet TAKE 1 TABLET BY MOUTH AT BEDTIME   b complex vitamins tablet Take 1 tablet by mouth daily.   Cholecalciferol (VITAMIN D3) 2000 UNITS TABS Take 2,000 Units by mouth 2 (two) times a week. Tuesday and Saturdays.   clotrimazole (MYCELEX) 10 MG troche Take 10 mg by mouth as needed.   Coenzyme Q10 (COQ-10) 200 MG CAPS Take 200 mg by mouth daily.    conjugated estrogens (PREMARIN) vaginal cream Apply 0.63m (pea-sized amount)  just inside the vaginal introitus with a finger-tip on  Monday, Wednesday and Friday nights. (Patient taking differently: Apply 0.569m(pea-sized amount)  just inside the vaginal introitus with a finger-tip on  Monday, Wednesday and Friday nights.)   CRANBERRY PO Take 1 capsule by mouth daily.   ELIQUIS 5 MG TABS tablet TAKE 1 TABLET BY MOUTH TWICE DAILY   ezetimibe (ZETIA) 10 MG tablet TAKE 1 TABLET BY MOUTH DAILY   furosemide (LASIX) 40 MG tablet Take 40 mg tablet in the am and 20 mg in the pm   hydrocortisone 2.5 % cream APPLY TO LICHEN PLANUS AS DIRECTED   Iron, Ferrous Sulfate, 325 (65 Fe) MG TABS Take 325 mg by mouth daily.   loratadine (CLARITIN) 10 MG tablet Take 10 mg by mouth daily.   meclizine (ANTIVERT) 25 MG tablet Take 1 tablet (25 mg total) by mouth 3 (three) times daily as needed for dizziness.   Multiple Vitamins-Minerals (HAIR/SKIN/NAILS PO) Take 1 tablet by mouth daily.   mupirocin ointment (BACTROBAN) 2 % Apply to affected area bid   pantoprazole (PROTONIX) 40 MG tablet TAKE 1 TABLET BY MOUTH TWICE DAILY   polyethylene glycol powder (GLYCOLAX/MIRALAX) 17 GM/SCOOP powder Take 8.5 g by mouth daily. Hold if diarrhea   ramipril (ALTACE) 5 MG capsule TAKE 1 CAPSULE(5 MG) BY MOUTH DAILY   REPATHA SURECLICK 14272G/ML SOAJ INJECT 1 PEN EVERY 14 DAYS AS DIRECTED   spironolactone (ALDACTONE) 25 MG tablet TAKE 1 TABLET BY  MOUTH EVERY MORNING   traMADol (ULTRAM) 50 MG tablet TAKE ONE TABLET TWICE DAILY AS NEEDED   traZODone (DESYREL) 150 MG tablet Take two tablets by mouth at bedtime as needed for sleep.   triamcinolone cream (KENALOG) 0.1 % Apply topically 2 (two) times daily.   verapamil (CALAN-SR) 180 MG CR tablet TAKE ONE (1) TABLET BY MOUTH TWO TIMES PER DAY   vitamin B-12 (CYANOCOBALAMIN) 1000 MCG tablet Take 1,000 mcg by mouth daily.   vitamin C (ASCORBIC ACID) 500 MG tablet Take 500 mg by mouth at bedtime.   No facility-administered encounter medications on file as of 06/22/2021.     Lab Results  Component Value Date   WBC 5.0 06/22/2021   HGB 12.8 06/22/2021   HCT 39.2 06/22/2021   PLT 234.0 06/22/2021   GLUCOSE 125 (H) 06/22/2021   CHOL 143 06/22/2021  TRIG 197.0 (H) 06/22/2021   HDL 60.00 06/22/2021   LDLDIRECT 126.7 07/03/2013   LDLCALC 44 06/22/2021   ALT 20 06/22/2021   AST 22 06/22/2021   NA 137 06/22/2021   K 3.7 06/22/2021   CL 98 06/22/2021   CREATININE 1.15 06/22/2021   BUN 27 (H) 06/22/2021   CO2 30 06/22/2021   TSH 1.36 06/22/2021   INR 1.34 03/28/2018   HGBA1C 6.3 06/22/2021    MR Brain Wo Contrast  Result Date: 01/25/2021 CLINICAL DATA:  Dizziness and fall. Bilateral lower extremity weakness. EXAM: MRI HEAD WITHOUT CONTRAST TECHNIQUE: Multiplanar, multiecho pulse sequences of the brain and surrounding structures were obtained without intravenous contrast. COMPARISON:  03/13/2017 FINDINGS: Brain: No acute infarct, mass effect or extra-axial collection. No acute or chronic hemorrhage. There is multifocal hyperintense T2-weighted signal within the white matter. Generalized volume loss without a clear lobar predilection. A partially empty sella is incidentally noted. Vascular: Major flow voids are preserved. Skull and upper cervical spine: Normal calvarium and skull base. Visualized upper cervical spine and soft tissues are normal. Sinuses/Orbits:No paranasal sinus fluid levels  or advanced mucosal thickening. No mastoid or middle ear effusion. Normal orbits. IMPRESSION: 1. No acute intracranial abnormality. 2. Findings of chronic small vessel ischemic disease and generalized volume loss, typical for age. Electronically Signed   By: Ulyses Jarred M.D.   On: 01/25/2021 02:25       Assessment & Plan:   Problem List Items Addressed This Visit     Anemia    Follow cbc.       Relevant Orders   CBC with Differential/Platelet (Completed)   Ferritin (Completed)   Asthma    Breathing relatively stable.       Atrial fibrillation ([place])    Followed by cardiology.  On eliquis.   Stable.  No changes made.        Chronic diastolic heart failure (HCC)    Breathing stable.  On altace, lasix and aldactone.  Appears to be stable.  Follow.  No evidence of volume overload on exam.       Essential hypertension, benign - Primary    Blood pressure doing well as outlined.  Continue verapamil, aldactone, lasix and altace.        Relevant Orders   TSH (Completed)   Basic metabolic panel (Completed)   History of colonic polyps    Had colonoscopy 03/19/20.  No bleeding reported.       Hypercholesterolemia    On repatha.  Follow lipid panel.       Relevant Orders   Lipid panel (Completed)   Hepatic function panel (Completed)   Hyperglycemia    Low carb diet and exercise.  Follow met b and a1c.       Relevant Orders   Hemoglobin A1c (Completed)   Rectal prolapse    Has seen Dr Leighton Ruff [facility]) - follow.       Weakness    Leg weakness.  Has been to PT.  Continue walker and exercise as tolerated.  Follow.       Other Visit Diagnoses     Neuropathy       Need for immunization against influenza       Relevant Orders   Flu Vaccine QUAD High Dose(Fluad) (Completed)        [name], Charlotte Wagner

## 2021-06-23 ENCOUNTER — Ambulatory Visit: Payer: Medicare Other | Admitting: Internal Medicine

## 2021-06-30 DIAGNOSIS — Z23 Encounter for immunization: Secondary | ICD-10-CM | POA: Diagnosis not present

## 2021-07-01 ENCOUNTER — Encounter: Payer: Self-pay | Admitting: Internal Medicine

## 2021-07-01 NOTE — Assessment & Plan Note (Signed)
Had colonoscopy 03/19/20.  No bleeding reported.

## 2021-07-01 NOTE — Assessment & Plan Note (Signed)
Low carb diet and exercise.  Follow met b and a1c.  

## 2021-07-01 NOTE — Assessment & Plan Note (Signed)
Follow cbc.  

## 2021-07-01 NOTE — Assessment & Plan Note (Signed)
Leg weakness.  Has been to PT.  Continue walker and exercise as tolerated.  Follow.

## 2021-07-01 NOTE — Assessment & Plan Note (Signed)
On repatha.  Follow lipid panel.  

## 2021-07-01 NOTE — Assessment & Plan Note (Signed)
Has seen Dr Alicia Thomas ([facility]) - follow.  

## 2021-07-01 NOTE — Assessment & Plan Note (Addendum)
Followed by cardiology.  On eliquis.   Stable.  No changes made.   

## 2021-07-01 NOTE — Assessment & Plan Note (Addendum)
Breathing stable.  On altace, lasix and aldactone.  Appears to be stable.  Follow.  No evidence of volume overload on exam.

## 2021-07-01 NOTE — Assessment & Plan Note (Signed)
Blood pressure doing well as outlined.  Continue verapamil, aldactone, lasix and altace.   

## 2021-07-01 NOTE — Assessment & Plan Note (Signed)
Breathing relatively stable.

## 2021-07-05 ENCOUNTER — Telehealth: Payer: Self-pay | Admitting: Internal Medicine

## 2021-07-05 NOTE — Telephone Encounter (Signed)
LM for patient letting her know that labs were mailed on 9/16. Advised to call back if did not receive

## 2021-07-05 NOTE — Telephone Encounter (Signed)
Patient requesting that you mail her lab results.

## 2021-07-10 ENCOUNTER — Telehealth: Payer: Self-pay | Admitting: Cardiovascular Disease

## 2021-07-10 MED ORDER — REPATHA SURECLICK 140 MG/ML ~~LOC~~ SOAJ: SUBCUTANEOUS | 12 refills | Status: DC

## 2021-07-10 NOTE — Telephone Encounter (Signed)
Called and spoke with pharmacy and they were able to fill until she was accepted in the program. Spoke with program and faxed prescription to number provided. Advised Dr. Delene Loll to answer any strange calls for the next week and a half in case they are calling to verify her shipping address. She verbalized understanding of our conversation, agreement with plan, and had no further questions at this time. Will file information in samples closet if any further assistance is needed.

## 2021-07-10 NOTE — Telephone Encounter (Signed)
Pt c/o medication issue:  1. Name of Medication: Repatha  2. How are you currently taking this medication (dosage and times per day)? Once's every 2 weeks  3. Are you having a reaction (difficulty breathing--STAT)? no  4. What is your medication issue? Patient needs assistants

## 2021-07-10 NOTE — Telephone Encounter (Signed)
Spoke with patient and she requested assistance completing application for assistance. It appears that application was completed and faxed in. She was also approved to receive until 10/07/21. Patient is difficult to understand due to throat problems. [place] and they provided fax number to send updated prescription. Will call patient and update her on information that was needed and information needed for next year.

## 2021-07-10 NOTE — Telephone Encounter (Signed)
Attempted to assist pt.  She requested assistance from Romualdo Bolk, RN. She stated that she did not want to trouble me, and that Pam would know her better. I offered my assistance again, but she stated a preference to speak with Pam. Will route to [facility] for further assistance.

## 2021-07-11 NOTE — Telephone Encounter (Signed)
Patient calling back and states she did not get lab results in  the mail. Verified Patient address and mailed this out again.

## 2021-07-14 NOTE — Telephone Encounter (Signed)
Patient returning call States assistance is still needed for repatha and she is out  Will need to take a shot on Monday  Please call to discuss

## 2021-07-14 NOTE — Telephone Encounter (Signed)
I spoke with the Luellen Pucker at [facility]. I have advised her of Pharm D recommendations as stated below by [facility]. Per Luellen Pucker, she will discuss with the patient. Phone # for the [employer] given to her.

## 2021-07-14 NOTE — Telephone Encounter (Signed)
To Pharm D to see if they can assist with the patient.

## 2021-07-14 NOTE — Telephone Encounter (Signed)
Pam's note states that pt was approved to receive Repatha from the [facility] through 10/07/21. She should call them to deliver her medication. Their number is [phone].

## 2021-07-14 NOTE — Telephone Encounter (Signed)
Returned the patients call. Lmtcb if assistance is still needed. The customer service number for Shirlean Kelly (Ronna Polio)  is [phone].

## 2021-07-14 NOTE — Telephone Encounter (Signed)
Needing information of the discount prescription from Pharmacy for her [place], needs by Monday

## 2021-07-17 NOTE — Telephone Encounter (Signed)
Luellen Pucker with [facility] calling States they are trying to get Repatha from [place] and they are needing a prescription number that starts with F Please call to discuss

## 2021-07-17 NOTE — Telephone Encounter (Signed)
Spoke to [place], notified I do not have a prescription number starting with the letter "F". I did give her the Case ID number for [employer] approval: [id number]. Luellen Pucker will let us know if anything else is needed.

## 2021-07-18 ENCOUNTER — Telehealth: Payer: Self-pay | Admitting: *Deleted

## 2021-07-18 NOTE — Telephone Encounter (Signed)
Patient calling for status/update

## 2021-07-18 NOTE — Telephone Encounter (Signed)
I spoke to pharmacy concerning PA received through [employer] cover[employer]ymeds. She mentioned pt needed to have PA completed in order for insurance to cover medication. PA has been completed and awaiting approval.   Your information has been submitted to [place]. [employer] [place] will review the request and notify you of the determination decision directly, typically within 3 business days of your submission and once all necessary information is received.  You will also receive your request decision electronically. To check for an update later, open the request again from your dashboard.  If [employer] [place] has not responded within the specified timeframe or if you have any questions about your PA submission, contact [place] directly at [facility]) [phone] or ([place]) [phone].

## 2021-07-19 NOTE — Telephone Encounter (Signed)
Attempted to call pt.  No answer. LDM stating that I have given Charlotte Wagner the information needed and she is going to call.  If pt still has questions she may return call to our office to discuss further.

## 2021-07-21 NOTE — Telephone Encounter (Signed)
According to CoverMymeds medication has been approved. Charlotte Wagner will notify patient.  Status: PA Response - Approved  Created: October 11th, 2022 [phone]  Sent: October 11th, 2022

## 2021-07-21 NOTE — Telephone Encounter (Signed)
Attempted to reach out to pt to f/u on her call earlier regarding her Repatha. Unable to make contact, LMTCB on Monday.   According to CoverMymeds medication has been approved. (Reviewed by Marinda Elk)   Status: PA Response - Approved   Created: October 11th, 2022 [phone]   Sent: October 11th, 2022

## 2021-07-21 NOTE — Telephone Encounter (Signed)
Charlotte Wagner calling in to return call, reports her Repatha was suppose to arrive today but it hasn't. Advised the pharmacy should be sending it in, may be a delay a day or two depending on what carrier is shipping her medication as sometimes it may take an extra day or two. If not arrived soon call pharmacy  Pt verbalized understanding and is thankful for the explanation.

## 2021-07-21 NOTE — Telephone Encounter (Signed)
Please call to discuss PA for Repatha

## 2021-07-24 MED ORDER — REPATHA SURECLICK 140 MG/ML ~~LOC~~ SOAJ: 1.0000 "pen " | SUBCUTANEOUS | 12 refills | Status: DC

## 2021-07-24 NOTE — Addendum Note (Signed)
Addended by: Wynema Birch on: 07/24/2021 11:09 AM   Modules accepted: Orders

## 2021-07-24 NOTE — Telephone Encounter (Signed)
Reach back out to [place], who advised pt still has not received her medication for Repatha. Stated she spoke with [employer] who advised her that "we need a new prescription as the other script was not valid".   Was able to find fax that [facility], RN sent in on 10/3 with the printed script signed by Dr. Rockey Situ. This RN resent the fax to [place] with new script signed by Dr. Rockey Situ to   [facility] Fax: [phone]  Patient Case ID: [id number]

## 2021-07-24 NOTE — Telephone Encounter (Signed)
Nurse with [facility] is calling to check status of Repatha. Please call to [facility]

## 2021-07-25 ENCOUNTER — Other Ambulatory Visit: Payer: Self-pay | Admitting: Internal Medicine

## 2021-08-09 ENCOUNTER — Other Ambulatory Visit: Payer: Self-pay | Admitting: Internal Medicine

## 2021-08-14 DIAGNOSIS — I471 Supraventricular tachycardia: Secondary | ICD-10-CM | POA: Diagnosis not present

## 2021-08-14 DIAGNOSIS — I6529 Occlusion and stenosis of unspecified carotid artery: Secondary | ICD-10-CM | POA: Diagnosis not present

## 2021-08-14 DIAGNOSIS — I739 Peripheral vascular disease, unspecified: Secondary | ICD-10-CM | POA: Diagnosis not present

## 2021-08-14 DIAGNOSIS — N189 Chronic kidney disease, unspecified: Secondary | ICD-10-CM | POA: Diagnosis not present

## 2021-08-14 DIAGNOSIS — I10 Essential (primary) hypertension: Secondary | ICD-10-CM | POA: Diagnosis not present

## 2021-08-14 DIAGNOSIS — R309 Painful micturition, unspecified: Secondary | ICD-10-CM | POA: Diagnosis not present

## 2021-08-17 ENCOUNTER — Telehealth: Payer: Self-pay

## 2021-08-17 NOTE — Telephone Encounter (Signed)
Labs received from from [facility]. Will abstract after your review. Placed out for your review.

## 2021-08-18 NOTE — Telephone Encounter (Signed)
Spoke with pt. Pt received results and verbalized understanding.

## 2021-08-18 NOTE — Telephone Encounter (Signed)
LMTCB

## 2021-08-18 NOTE — Telephone Encounter (Signed)
Notify cholesterol levels are ok.  Kidney function stable.  Hgb wnl.

## 2021-08-21 ENCOUNTER — Telehealth: Payer: Self-pay | Admitting: Internal Medicine

## 2021-08-21 NOTE — Telephone Encounter (Signed)
Patient called and is requesting a refill on her Ketoconazole 2%

## 2021-08-22 MED ORDER — KETOCONAZOLE 2 % EX CREA: 1.0000 "application " | TOPICAL_CREAM | Freq: Every day | CUTANEOUS | 0 refills | Status: DC

## 2021-08-22 NOTE — Telephone Encounter (Signed)
Patient stated she has lichen planus on the vulva which is causing a yeast infection. She usually has monostat to tx sx but she is now becoming sensitive to that medication. She tried and old rx of ketoconazole 2% and it worked Engineer, manufacturing. She would like a refill. She ran out and started back on the other and its breaking her out worse.

## 2021-08-22 NOTE — Telephone Encounter (Signed)
Please confirm why needing.

## 2021-08-22 NOTE — Addendum Note (Signed)
Addended by: Alisa Graff on: 08/22/2021 01:32 PM   Modules accepted: Orders

## 2021-08-22 NOTE — Telephone Encounter (Signed)
Rx sent in for ketoconazole cream.  Please notify

## 2021-09-13 ENCOUNTER — Other Ambulatory Visit: Payer: Self-pay | Admitting: Internal Medicine

## 2021-09-13 NOTE — Telephone Encounter (Signed)
Rx ok'd for tramadol #60 with no refills.

## 2021-09-13 NOTE — Telephone Encounter (Signed)
RX Refill: tramadol Last Seen: 06-22-21 Last Ordered: 06-20-21 Next Appt: 10-26-21

## 2021-10-03 ENCOUNTER — Other Ambulatory Visit: Payer: Self-pay | Admitting: Cardiovascular Disease

## 2021-10-03 NOTE — Telephone Encounter (Signed)
Pt last saw Dr Rockey Situ 04/05/21, last labs 06/22/21 Creat 1.15, age 85, weight 84.4kg, based on specified criteria pt is on appropriate dosage of Eliquis 5mg  BID for afib.  Will refill rx.

## 2021-10-03 NOTE — Telephone Encounter (Signed)
Refill request

## 2021-10-09 NOTE — Progress Notes (Deleted)
Date:  10/09/2021   ID:  Charlotte Sessions, MD, DOB [birth year], MRN [id number]  Patient Location:  [address] [place] [address]   Provider location:   [facility], [place] office  PCP:  [name], MD  Cardiologist:  [facility]  No chief complaint on file.   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  coronary artery disease  prior bypass surgery 1998 stent to  graft 8/17  Echo EF 1/18 with EF 60-65%   moderate to severe left atrial enlargement cardioversion that did not hold,  reverted to atrial fibrillation Did not feel well on b-blocker, better on ca channel blocker Statin intolerance , on Repatha Who presents for routine follow-up of her chronic stable angina, atrial fibrillation    Ms. Gosch reports overall doing well Lives at [facility], assisted living  Uses a walker Compression hose bilaterally Swelling worse on the left than the right, chronic issue  Recent increase in weight up to 186 pounds with increased swelling left lower extremity, etiology unclear Increased her diuretics and over the next several days improvement of her symptoms, weight back down to 180  Blood pressure stable 371 systolic legs are weaker  No falls Not on oxygen  Larynx paralyzed, difficulty talking, very soft-spoken  Total cholesterol 154 LDL 63 Creatinine 1.3  EKG personally reviewed by myself on todays visit  atrial fib rate 73 beats per minute ,poor R wave progression, no change from prior studies  Other past medical history reviewed echocardiogram 02/2020 Left ventricular ejection fraction, by estimation, is 60 to 65%. The  left ventricle has normal function. right ventricular size is moderately enlarged. severely elevated  pulmonary artery systolic pressure.  TR Peak grad:   61.8 mmHg  TR Vmax:        393.00 cm/s   Cath  03/31/2018 Significant underlying three-vessel coronary artery disease with patent grafts including LIMA to LAD, SVG to RCA, SVG to OM and SVG to diagonal.  Patent stent in the SVG to RCA.  Severe new stenosis and SVG to diagonal is likely the culprit. STENT RESOLUTE ONYX 4.0X15. to SVG to diag  hospital admission for acute on chronic diastolic and systolic CHF Cath 0/62/6948 .  Significant underlying three-vessel coronary artery disease with patent grafts including LIMA to LAD, SVG to RCA, SVG to OM and SVG to diagonal.  Patent stent in the SVG to RCA.  Severe new stenosis and SVG to diagonal is likely the culprit. 2.  Left ventricular angiography was not performed. 3.  High normal left ventricular end-diastolic pressure at 12 mmHg. 4.  Successful direct stenting of SVG to diagonal using a distal protection device.   ECHO Prior to cardiac catheterization and stent placement ejection fraction was in the range of 30% to 35%. Hypokinesis of the anteroseptal myocardium, apical,  anterior myocardium.  - Mitral valve: There was mild regurgitation. - Left atrium: The atrium was mildly to moderately dilated. - Right ventricle: Systolic function was moderately to   severely reduced. - Right atrium: The atrium was mildly dilated.  patent foramen ovale. - Tricuspid valve: moderate-severe regurgitation. - Pulmonary arteries: Systolic pressure was moderate to severely elevated PA peak pressure: 58 mm Hg (S).    long history of Statin intolerance, tried 4 different medications, had severe myalgias,  Lipitor,crestor, simvastatin all had severe side  effects  on Zetia, tolerating this Cholesterol still above goal, LDL 112 on Zetia alone   History of bleeding from hemorrhoids. Stable recently   Carotid ultrasound Moderate atherosclerotic plaque over the left carotid bulb     Prior CV studies:   The following studies were reviewed today:   Past Medical History:  Diagnosis Date   Allergy     Arthritis    s/p bilateral knees and left hip replacement   Asthma    CAD (coronary artery disease)    a. 12/1996 s/p CABG x4 [facility] [place]);  b. 2005 Pt reports stress test & cath, which revealed patent grafts.   Cancer [facility])    melanoma right arm   Carotid arterial disease ([place])    a. 04/2015 Carotid U/S: <50% bilat ICA stenosis.   Chronic kidney disease    Colon polyps    H/O   Depression    GERD (gastroesophageal reflux disease)    h/o hiatal hernia   Headache    migraines in past   Heart murmur    a. 04/2011 Echo: EF 55-60%, bilat atrial enlargement, mild to mod TR.   History of chicken pox    History of hiatal hernia    Hx of migraines    rare now   Hx: UTI (urinary tract infection)    Hyperlipidemia    a. Statin intolerant -->on zetia.   Hypertension    Hypertensive heart disease    Lichen planus    Melanoma (HCC)    Palpitations    a. rare PVC's and h/o SVT.   PMR (polymyalgia rheumatica) (HCC)    h/o in setting of crestor usage.   PSVT (paroxysmal supraventricular tachycardia) (HCC)    PUD (peptic ulcer disease)    remote history   Raynaud's phenomenon    Spinal stenosis    Urine incontinence    H/O   Vitamin D deficiency    Past Surgical History:  Procedure Laterality Date   ADENOIDECTOMY     age 21   BACK SURGERY     L3-L5   BILATERAL CARPAL TUNNEL RELEASE     BREAST BIOPSY Right    bx x 3 neg   BREAST SURGERY Right    biopsy x 3 (all benign)   CARDIAC CATHETERIZATION N/A 06/01/2016   Procedure: LEFT HEART CATH AND CORS/GRAFTS ANGIOGRAPHY;  Surgeon: Wellington Hampshire, MD;  Location: [place] [facility];  Service: Cardiovascular;  Laterality: N/A;   CARDIAC CATHETERIZATION N/A 06/01/2016   Procedure: Coronary Stent Intervention;  Surgeon: Wellington Hampshire, MD;  Location: [place] [facility];  Service: Cardiovascular;  Laterality: N/A;   CARDIOVERSION N/A 03/08/2017   Procedure: CARDIOVERSION;  Surgeon: Wellington Hampshire, MD;  Location: [facility];   Service: Cardiovascular;  Laterality: N/A;   CATARACT EXTRACTION W/PHACO Right 01/04/2016   Procedure: CATARACT EXTRACTION PHACO AND INTRAOCULAR LENS PLACEMENT ([place]);  Surgeon: Leandrew Koyanagi, MD;  Location: [place];  Service: Ophthalmology;  Laterality: Right;  MALYUGIN   CATARACT EXTRACTION W/PHACO Left 01/25/2016   Procedure: CATARACT EXTRACTION PHACO AND INTRAOCULAR LENS PLACEMENT ([place]) left eye;  Surgeon: Leandrew Koyanagi, MD;  Location: [place];  Service: Ophthalmology;  Laterality: Left;  MALYUGIN SHUGARCAINE   CHOLECYSTECTOMY  90's   COLONOSCOPY  03/19/2020   Procedure: COLONOSCOPY;  Surgeon: Virgel Manifold, MD;  Location: [facility];  Service: Gastroenterology;;   COLONOSCOPY WITH PROPOFOL N/A 05/03/2015   Procedure: COLONOSCOPY WITH PROPOFOL;  Surgeon: Lollie Sails, MD;  Location:  [place] [facility];  Service: Endoscopy;  Laterality: N/A;   COLONOSCOPY WITH PROPOFOL N/A 03/19/2020   Procedure: COLONOSCOPY WITH PROPOFOL;  Surgeon: Virgel Manifold, MD;  Location: [facility];  Service: Endoscopy;  Laterality: N/A;   CORONARY ARTERY BYPASS GRAFT  98   4 vessel   CORONARY ARTERY BYPASS GRAFT     x 4 age 77 per pt    CORONARY STENT INTERVENTION N/A 03/31/2018   Procedure: CORONARY STENT INTERVENTION;  Surgeon: Wellington Hampshire, MD;  Location: [place] [facility];  Service: Cardiovascular;  Laterality: N/A;   ESOPHAGOGASTRODUODENOSCOPY N/A 03/22/2015   Procedure: ESOPHAGOGASTRODUODENOSCOPY (EGD);  Surgeon: Lollie Sails, MD;  Location: [facility];  Service: Endoscopy;  Laterality: N/A;   ESOPHAGOGASTRODUODENOSCOPY N/A 03/19/2020   Procedure: ESOPHAGOGASTRODUODENOSCOPY (EGD);  Surgeon: Virgel Manifold, MD;  Location: [facility];  Service: Endoscopy;  Laterality: N/A;   HEMORRHOID BANDING     HEMORRHOID SURGERY N/A 02/17/2018   Procedure: HEMORRHOIDECTOMY;  Surgeon: Robert Bellow, MD;  Location: [facility];  Service: General;   Laterality: N/A;   JOINT REPLACEMENT     BILATERAL KNEE REPLACEMENTS   KNEE ARTHROSCOPY W/ OATS PROCEDURE     Lt knee (9/01), Rt knee (3/11), Lt hip (5/10)   LEFT HEART CATH AND CORONARY ANGIOGRAPHY N/A 03/31/2018   Procedure: LEFT HEART CATH AND CORONARY ANGIOGRAPHY;  Surgeon: Wellington Hampshire, MD;  Location: [place] [facility];  Service: Cardiovascular;  Laterality: N/A;   TOTAL HIP ARTHROPLASTY       No outpatient medications have been marked as taking for the 10/10/21 encounter (Appointment) with Minna Merritts, MD.     Allergies:   Beta adrenergic blockers; Brilinta [ticagrelor]; Albuterol; Antihistamines, diphenhydramine-type; Aspirin; Nsaids; Penicillins; Statins; Sulfa antibiotics; Sulfasalazine; Tetracycline; and Tetracyclines & related   Social History   Tobacco Use   Smoking status: Former    Types: Cigarettes   Smokeless tobacco: Never   Tobacco comments:    quit 47+ yrs ago  Vaping Use   Vaping Use: Never used  Substance Use Topics   Alcohol use: No    Alcohol/week: 0.0 standard drinks   Drug use: No      Family Hx: The patient's family history includes Alcohol abuse in her father; Depression in her daughter, father, maternal grandmother, and son; Heart disease in her father; Hypertension in her maternal grandfather and paternal grandfather; Lung cancer in her sister; Seizures in her son; Stroke in her maternal grandmother; Thyroid disease in her daughter and mother. There is no history of Breast cancer.  ROS:   Please see the history of present illness.    Review of Systems  Constitutional: Negative.   HENT: Negative.    Respiratory:  Positive for shortness of breath.   Cardiovascular:  Positive for leg swelling.  Gastrointestinal: Negative.   Musculoskeletal: Negative.        Gait instability  Neurological: Negative.   Psychiatric/Behavioral: Negative.    All other systems reviewed and are negative.   Labs/Other Tests and Data Reviewed:    Recent  Labs: 06/22/2021: ALT 20; BUN 27; Creatinine, Ser 1.15; Hemoglobin 12.8; Platelets 234.0; Potassium 3.7; Sodium 137; TSH 1.36   Recent Lipid Panel Lab Results  Component Value Date/Time   CHOL 143 06/22/2021 02:27 PM   TRIG 197.0 (H) 06/22/2021 02:27 PM   HDL 60.00 06/22/2021 02:27 PM   CHOLHDL 2 06/22/2021 02:27 PM   LDLCALC 44 06/22/2021 02:27 PM   LDLDIRECT 126.7 07/03/2013 09:37 AM  Wt Readings from Last 3 Encounters:  06/22/21 186 lb (84.4 kg)  05/29/21 182 lb (82.6 kg)  04/05/21 182 lb (82.6 kg)     Exam:    Vital Signs: Vital signs may also be detailed in the HPI There were no vitals taken for this visit.   Constitutional:  oriented to person, place, and time. No distress.  Soft-spoken HENT:  Head: Grossly normal Eyes:  no discharge. No scleral icterus.  Neck: No JVD, no carotid bruits  Cardiovascular: Regular rate and rhythm, no murmurs appreciated Trace edema left lower extremity Pulmonary/Chest: Clear to auscultation bilaterally, no wheezes or rales Abdominal: Soft.  no distension.  no tenderness.  Musculoskeletal: Normal range of motion Neurological:  normal muscle tone. Coordination normal. No atrophy Skin: Skin warm and dry Psychiatric: normal affect, pleasant  ASSESSMENT & PLAN:    Persistent atrial fibrillation Rate controlled, on anticoagulation No medication changes made Contributing to CHF as below  Pulmonary hypertension/diastolic CHF  Lasix  40 in Am, 20 in PM Periodically needing extra doses of Lasix for weight gain Recently treated herself appropriately for weight 186 pounds, now down to 180 and leg edema much improved -Potentially could consider metolazone 2.5 mg as needed if weight does not improve on higher dose Lasix 40 twice daily Creatinine 1.3, high end of the range Repeat BMP ordered for new baseline  Chronic diastolic heart failure (HCC)  Lasix 40 in the mornings, 20 mg in the p.m. BMP ordered  Coronary artery disease of native  artery of native heart with stable angina pectoris ([place]) Currently with no symptoms of angina. No further workup at this time. Continue current medication regimen.  Hypercholesterolemia Cholesterol is at goal on the current lipid regimen. No changes to the medications were made.  Essential hypertension, benign Blood pressure is well controlled on today's visit. No changes made to the medications.  Dyspnea, unspecified type  pulmonary hypertension, kyphosis, deconditioning, atrial fibrillation Legs weak, limited exercise Relatively stable  TGA (transient global amnesia) Denies any further episodes, cognition stable stable   Total encounter time more than 25 minutes  Greater than 50% was spent in counseling and coordination of care with the patient    Signed, Ida Rogue, MD  10/09/2021 5:38 PM    [place] [facility] [address], [place], [place] [address]

## 2021-10-10 ENCOUNTER — Ambulatory Visit: Payer: Medicare Other | Admitting: Cardiovascular Disease

## 2021-10-10 ENCOUNTER — Other Ambulatory Visit: Payer: Self-pay | Admitting: Internal Medicine

## 2021-10-10 DIAGNOSIS — I25118 Atherosclerotic heart disease of native coronary artery with other forms of angina pectoris: Secondary | ICD-10-CM

## 2021-10-10 DIAGNOSIS — Z789 Other specified health status: Secondary | ICD-10-CM

## 2021-10-10 DIAGNOSIS — I4819 Other persistent atrial fibrillation: Secondary | ICD-10-CM

## 2021-10-10 DIAGNOSIS — I10 Essential (primary) hypertension: Secondary | ICD-10-CM

## 2021-10-10 DIAGNOSIS — I059 Rheumatic mitral valve disease, unspecified: Secondary | ICD-10-CM

## 2021-10-10 DIAGNOSIS — I071 Rheumatic tricuspid insufficiency: Secondary | ICD-10-CM

## 2021-10-10 DIAGNOSIS — E785 Hyperlipidemia, unspecified: Secondary | ICD-10-CM

## 2021-10-10 DIAGNOSIS — I119 Hypertensive heart disease without heart failure: Secondary | ICD-10-CM

## 2021-10-10 DIAGNOSIS — I272 Pulmonary hypertension, unspecified: Secondary | ICD-10-CM

## 2021-10-10 DIAGNOSIS — I5032 Chronic diastolic (congestive) heart failure: Secondary | ICD-10-CM

## 2021-10-23 ENCOUNTER — Telehealth: Payer: Self-pay | Admitting: Internal Medicine

## 2021-10-23 NOTE — Telephone Encounter (Signed)
If persistent cough or feels needs something - let us know.

## 2021-10-23 NOTE — Telephone Encounter (Signed)
Pt called and stated she test positive for covid every two days for the past two weeks. Pt just wanted the provider to know and to update her chart. I asked pt if she wanted to make an appt so she can get some medication. Pt stated the medication only works for the first five days. Pt only has cough right now and she said that the covid is going away

## 2021-10-24 ENCOUNTER — Other Ambulatory Visit: Payer: Self-pay | Admitting: Cardiovascular Disease

## 2021-10-24 ENCOUNTER — Other Ambulatory Visit: Payer: Self-pay | Admitting: Internal Medicine

## 2021-10-25 NOTE — Telephone Encounter (Signed)
Rx ok'd for trazodone #180 with no refills.

## 2021-10-26 ENCOUNTER — Ambulatory Visit: Payer: Medicare Other | Admitting: Internal Medicine

## 2021-10-26 ENCOUNTER — Ambulatory Visit (INDEPENDENT_AMBULATORY_CARE_PROVIDER_SITE_OTHER): Payer: Medicare Other | Admitting: Internal Medicine

## 2021-10-26 DIAGNOSIS — D649 Anemia, unspecified: Secondary | ICD-10-CM

## 2021-10-26 DIAGNOSIS — E7800 Pure hypercholesterolemia, unspecified: Secondary | ICD-10-CM

## 2021-10-26 DIAGNOSIS — I25118 Atherosclerotic heart disease of native coronary artery with other forms of angina pectoris: Secondary | ICD-10-CM | POA: Diagnosis not present

## 2021-10-26 DIAGNOSIS — F32A Depression, unspecified: Secondary | ICD-10-CM | POA: Diagnosis not present

## 2021-10-26 DIAGNOSIS — K2100 Gastro-esophageal reflux disease with esophagitis, without bleeding: Secondary | ICD-10-CM | POA: Diagnosis not present

## 2021-10-26 DIAGNOSIS — J4520 Mild intermittent asthma, uncomplicated: Secondary | ICD-10-CM

## 2021-10-26 DIAGNOSIS — I4819 Other persistent atrial fibrillation: Secondary | ICD-10-CM

## 2021-10-26 DIAGNOSIS — M4800 Spinal stenosis, site unspecified: Secondary | ICD-10-CM | POA: Diagnosis not present

## 2021-10-26 DIAGNOSIS — I10 Essential (primary) hypertension: Secondary | ICD-10-CM

## 2021-10-26 DIAGNOSIS — R739 Hyperglycemia, unspecified: Secondary | ICD-10-CM | POA: Diagnosis not present

## 2021-10-26 DIAGNOSIS — I5032 Chronic diastolic (congestive) heart failure: Secondary | ICD-10-CM

## 2021-10-26 NOTE — Progress Notes (Signed)
Patient ID: Charlotte Sessions, MD, female   DOB: October 15, 1935, 86 y.o.   MRN: [id number]   Virtual Visit via telephone Note  This visit type was conducted due to national recommendations for restrictions regarding the COVID-19 pandemic (e.g. social distancing).  This format is felt to be most appropriate for this patient at this time.  All issues noted in this document were discussed and addressed.  No physical exam was performed (except for noted visual exam findings with Video Visits).   I connected with Charlotte Wagner by telephone and verified that I am speaking with the correct person using two identifiers. Location patient: home Location provider: work  Persons participating in the telephone visit: patient, provider  The limitations, risks, security and privacy concerns of performing an evaluation and management service by telephone and the availability of in person appointments have been discussed.  It has also been discussed with the patient that there may be a patient responsible charge related to this service. The patient expressed understanding and agreed to proceed.  Reason for visit: follow up appt  HPI: Recently tested positive for covid.  Out of quarantine yesterday.  She feels she is back to her baseline.  Using atrovent.  Occasional cough, but overall breathing stable.  Chronic sob.  No chest pain.  [place]  Uses her rollator.  No acid reflux reported.  No abdominal pain.  Bowels moving.  Monitoring her oxygen level.  Room air 95%.  Walking 93%.  Eating.  No abdominal pain reported.  Persistent back pain.  Takes tylenol and tramadol q hs.  Needs to be able to function.  Handling stress.     ROS: See pertinent positives and negatives per HPI.  Past Medical History:  Diagnosis Date   Allergy    Arthritis    s/p bilateral knees and left hip replacement   Asthma    CAD (coronary artery disease)    a. 12/1996 s/p CABG x4 [facility] [place]  b.  2005 Pt reports stress test & cath, which revealed patent grafts.   Cancer [facility])    melanoma right arm   Carotid arterial disease ([place])    a. 04/2015 Carotid U/S: <50% bilat ICA stenosis.   Chronic kidney disease    Colon polyps    H/O   Depression    GERD (gastroesophageal reflux disease)    h/o hiatal hernia   Headache    migraines in past   Heart murmur    a. 04/2011 Echo: EF 55-60%, bilat atrial enlargement, mild to mod TR.   History of chicken pox    History of hiatal hernia    Hx of migraines    rare now   Hx: UTI (urinary tract infection)    Hyperlipidemia    a. Statin intolerant -->on zetia.   Hypertension    Hypertensive heart disease    Lichen planus    Melanoma (HCC)    Palpitations    a. rare PVC's and h/o SVT.   PMR (polymyalgia rheumatica) (HCC)    h/o in setting of crestor usage.   PSVT (paroxysmal supraventricular tachycardia) (HCC)    PUD (peptic ulcer disease)    remote history   Raynaud's phenomenon    Spinal stenosis    Urine incontinence    H/O   Vitamin D deficiency     Past Surgical History:  Procedure Laterality Date   ADENOIDECTOMY     age 52   BACK  SURGERY     L3-L5   BILATERAL CARPAL TUNNEL RELEASE     BREAST BIOPSY Right    bx x 3 neg   BREAST SURGERY Right    biopsy x 3 (all benign)   CARDIAC CATHETERIZATION N/A 06/01/2016   Procedure: LEFT HEART CATH AND CORS/GRAFTS ANGIOGRAPHY;  Surgeon: Iran Ouch, MD;  Location: [facility] INVASIVE CV LAB;  Service: Cardiovascular;  Laterality: N/A;   CARDIAC CATHETERIZATION N/A 06/01/2016   Procedure: Coronary Stent Intervention;  Surgeon: Iran Ouch, MD;  Location: [facility] INVAS[facility] CV LAB;  Service: Cardiovascular;  Laterality: N/A;   CARDIOVERSION N/A 03/08/2017   Procedure: CARDIOVERSION;  Surgeon: Iran Ouch, MD;  Location: [facility] ORS;  Service: Cardiovascular;  Laterality: N/A;   CATARACT EXTRACTION W/PHACO Right 01/04/2016   Procedure: CATARACT EXTRACTION PHACO AND INTRAOCULAR LENS  PLACEMENT (IOC);  Surgeon: Lockie Mola, MD;  Location: [facility];  Service: Ophthalmology;  Laterality: Right;  MALYUGIN   CATARACT EXTRACTION W/PHACO Left 01/25/2016   Procedure: CATARACT EXTRACTION PHACO AND INTRAOCULAR LENS PLACEMENT (IOC) left eye;  Surgeon: Lockie Mola, MD;  Location: [facility];  Service: Ophthalmology;  Laterality: Left;  MALYUGIN SHUGARCAINE   CHOLECYSTECTOMY  90's   COLONOSCOPY  03/19/2020   Procedure: COLONOSCOPY;  Surgeon: Pasty Spillers, MD;  Location: [facility] ENDOSCOPY;  Service: Gastroenterology;;   COLONOSCOPY WITH PROPOFOL N/A 05/03/2015   Procedure: COLONOSCOPY WITH PROPOFOL;  Surgeon: Christena Deem, MD;  Location: [facility] ENDOSCOPY;  Service: Endoscopy;  Laterality: N/A;   COLONOSCOPY WITH PROPOFOL N/A 03/19/2020   Procedure: COLONOSCOPY WITH PROPOFOL;  Surgeon: Pasty Spillers, MD;  Location: [facility] ENDOSCOPY;  Service: Endoscopy;  Laterality: N/A;   CORONARY ARTERY BYPASS GRAFT  98   4 vessel   CORONARY ARTERY BYPASS GRAFT     x 4 age 38 per pt    CORONARY STENT INTERVENTION N/A 03/31/2018   Procedure: CORONARY STENT INTERVENTION;  Surgeon: Iran Ouch, MD;  Location: [facility] INVAS[facility] CV LAB;  Service: Cardiovascular;  Laterality: N/A;   ESOPHAGOGASTRODUODENOSCOPY N/A 03/22/2015   Procedure: ESOPHAGOGASTRODUODENOSCOPY (EGD);  Surgeon: Christena Deem, MD;  Location: [facility] ENDOSCOPY;  Service: Endoscopy;  Laterality: N/A;   ESOPHAGOGASTRODUODENOSCOPY N/A 03/19/2020   Procedure: ESOPHAGOGASTRODUODENOSCOPY (EGD);  Surgeon: Pasty Spillers, MD;  Location: [facility] ENDOSCOPY;  Service: Endoscopy;  Laterality: N/A;   HEMORRHOID BANDING     HEMORRHOID SURGERY N/A 02/17/2018   Procedure: HEMORRHOIDECTOMY;  Surgeon: Earline Mayotte, MD;  Location: [facility] ORS;  Service: General;  Laterality: N/A;   JOINT REPLACEMENT     BILATERAL KNEE REPLACEMENTS   KNEE ARTHROSCOPY W/ OATS PROCEDURE     Lt knee (9/01), Rt knee (3/11), Lt  hip (5/10)   LEFT HEART CATH AND CORONARY ANGIOGRAPHY N/A 03/31/2018   Procedure: LEFT HEART CATH AND CORONARY ANGIOGRAPHY;  Surgeon: Iran Ouch, MD;  Location: [facility] INVASIVE CV LAB;  Service: Cardiovascular;  Laterality: N/A;   TOTAL HIP ARTHROPLASTY      Family History  Problem Relation Age of Onset   Thyroid disease Mother        graves disease   Heart disease Father        rheumatic heart   Alcohol abuse Father    Depression Father    Lung cancer Sister    Depression Daughter    Thyroid disease Daughter        hashimoto   Depression Son    Stroke Maternal Grandmother    Depression Maternal Grandmother  Hypertension Maternal Grandfather    Hypertension Paternal Grandfather    Seizures Son    Breast cancer Neg Hx     SOCIAL HX: reviewed.    Current Outpatient Medications:    acetaminophen (TYLENOL) 500 MG tablet, Take 1,000 mg by mouth 2 (two) times daily., Disp: , Rfl:    amitriptyline (ELAVIL) 25 MG tablet, TAKE 1 TABLET BY MOUTH AT BEDTIME, Disp: 30 tablet, Rfl: 5   ATROVENT HFA 17 MCG/ACT inhaler, INHALE 2 PUFFS INTO THE LUNGS EVERY 6 HOURS AS NEEDED FOR WHEEZING, Disp: 12.9 g, Rfl: 1   b complex vitamins tablet, Take 1 tablet by mouth daily., Disp: , Rfl:    Cholecalciferol (VITAMIN D3) 2000 UNITS TABS, Take 2,000 Units by mouth 2 (two) times a week. Tuesday and Saturdays., Disp: , Rfl:    clotrimazole (MYCELEX) 10 MG troche, Take 10 mg by mouth as needed., Disp: , Rfl:    Coenzyme Q10 (COQ-10) 200 MG CAPS, Take 200 mg by mouth daily. , Disp: , Rfl:    conjugated estrogens (PREMARIN) vaginal cream, Apply 0.5mg  (pea-sized amount)  just inside the vaginal introitus with a finger-tip on  Monday, Wednesday and Friday nights. (Patient taking differently: Apply 0.5mg  (pea-sized amount)  just inside the vaginal introitus with a finger-tip on  Monday, Wednesday and Friday nights.), Disp: 30 g, Rfl: 12   CRANBERRY PO, Take 1 capsule by mouth daily., Disp: , Rfl:     ELIQUIS 5 MG TABS tablet, TAKE 1 TABLET BY MOUTH TWICE DAILY, Disp: 180 tablet, Rfl: 1   Evolocumab (REPATHA SURECLICK) 923 MG/ML SOAJ, Inject 1 pen into the skin every 14 (fourteen) days., Disp: 2 mL, Rfl: 12   ezetimibe (ZETIA) 10 MG tablet, TAKE 1 TABLET BY MOUTH DAILY, Disp: 90 tablet, Rfl: 3   furosemide (LASIX) 40 MG tablet, Take 40 mg tablet in the am and 20 mg in the pm, Disp: , Rfl:    hydrocortisone 2.5 % cream, APPLY TO LICHEN PLANUS AS DIRECTED, Disp: 28.35 g, Rfl: 0   Iron, Ferrous Sulfate, 325 (65 Fe) MG TABS, Take 325 mg by mouth daily., Disp: 30 tablet, Rfl: 11   ketoconazole (NIZORAL) 2 % cream, Apply 1 application topically daily., Disp: 15 g, Rfl: 0   loratadine (CLARITIN) 10 MG tablet, Take 10 mg by mouth daily., Disp: , Rfl:    meclizine (ANTIVERT) 25 MG tablet, Take 1 tablet (25 mg total) by mouth 3 (three) times daily as needed for dizziness., Disp: 100 tablet, Rfl: 2   Multiple Vitamins-Minerals (HAIR/SKIN/NAILS PO), Take 1 tablet by mouth daily., Disp: , Rfl:    mupirocin ointment (BACTROBAN) 2 %, Apply to affected area bid, Disp: 22 g, Rfl: 0   pantoprazole (PROTONIX) 40 MG tablet, TAKE 1 TABLET BY MOUTH TWICE DAILY, Disp: 180 tablet, Rfl: 2   polyethylene glycol powder (GLYCOLAX/MIRALAX) 17 GM/SCOOP powder, Take 8.5 g by mouth daily. Hold if diarrhea, Disp: 255 g, Rfl: 0   ramipril (ALTACE) 5 MG capsule, TAKE 1 CAPSULE(5 MG) BY MOUTH DAILY, Disp: 90 capsule, Rfl: 3   spironolactone (ALDACTONE) 25 MG tablet, TAKE 1 TABLET BY MOUTH EVERY MORNING, Disp: 90 tablet, Rfl: 1   traMADol (ULTRAM) 50 MG tablet, TAKE ONE TABLET TWICE DAILY AS NEEDED, Disp: 60 tablet, Rfl: 0   traZODone (DESYREL) 150 MG tablet, TAKE 2 TABLETS BY MOUTH AT BEDTIME AS NEEDED FOR SLEEP, Disp: 180 tablet, Rfl: 0   triamcinolone cream (KENALOG) 0.1 %, Apply topically 2 (two) times daily., Disp: ,  Rfl:    verapamil (CALAN-SR) 180 MG CR tablet, TAKE ONE (1) TABLET BY MOUTH TWO TIMES PER DAY, Disp: 180  tablet, Rfl: 1   vitamin B-12 (CYANOCOBALAMIN) 1000 MCG tablet, Take 1,000 mcg by mouth daily., Disp: , Rfl:    vitamin C (ASCORBIC ACID) 500 MG tablet, Take 500 mg by mouth at bedtime., Disp: , Rfl:   EXAM:  VITALS per patient if applicable: 536/46, 82  GENERAL: alert. Sounds to be in no acute distress.  Answering questions appropriately.   PSYCH/NEURO: pleasant and cooperative, no obvious depression or anxiety, speech and thought processing grossly intact  ASSESSMENT AND PLAN:  Discussed the following assessment and plan:  Problem List Items Addressed This Visit     Anemia    Follow cbc.       Asthma    Recent covid.  Feels breathing back to baseline.  Follow.       Atrial fibrillation ([place])    Followed by cardiology.  On eliquis.   Stable.  No changes made.        Chronic diastolic heart failure (HCC)    Breathing stable.  On altace, lasix and aldactone.  Appears to be stable.  Follow.        Coronary artery disease of native artery of native heart with stable angina pectoris ([place])    Followed by cardiology.  On eliquis.  Stable.        Depression    Doing well on current regimen.  Trazodone 2 q hs to help her sleep.  Denies any adverse effects. Follow.        Essential hypertension, benign    Blood pressure doing well as outlined.  Continue verapamil, aldactone, lasix and altace.        GERD (gastroesophageal reflux disease)    No upper symptoms.  On prilosec.       Hypercholesterolemia    On repatha.  Follow lipid panel.       Hyperglycemia    Low carb diet and exercise.  Follow met b and a1c.       Spinal stenosis    Chronic back and leg pain.  Requires tramadol and tylenol to function.  Walks with rollator.  Stable.         Return if symptoms worsen or fail to improve, for keep scheduled.   I discussed the assessment and treatment plan with the patient. The patient was provided an opportunity to ask questions and all were answered. The patient  agreed with the plan and demonstrated an understanding of the instructions.   The patient was advised to call back or seek an in-person evaluation if the symptoms worsen or if the condition fails to improve as anticipated.  I provided 23 minutes of non-face-to-face time during this encounter.   [name], MD

## 2021-10-29 ENCOUNTER — Encounter: Payer: Self-pay | Admitting: Internal Medicine

## 2021-10-29 NOTE — Assessment & Plan Note (Signed)
Chronic back and leg pain.  Requires tramadol and tylenol to function.  Walks with rollator.  Stable.   

## 2021-10-29 NOTE — Assessment & Plan Note (Signed)
Followed by cardiology.  On eliquis.  Stable.   

## 2021-10-29 NOTE — Assessment & Plan Note (Signed)
Low carb diet and exercise.  Follow met b and a1c.

## 2021-10-29 NOTE — Assessment & Plan Note (Signed)
Follow cbc.  

## 2021-10-29 NOTE — Assessment & Plan Note (Signed)
Recent covid.  Feels breathing back to baseline.  Follow.

## 2021-10-29 NOTE — Assessment & Plan Note (Signed)
On repatha.  Follow lipid panel.  

## 2021-10-29 NOTE — Assessment & Plan Note (Signed)
Followed by cardiology.  On eliquis.   Stable.  No changes made.   

## 2021-10-29 NOTE — Assessment & Plan Note (Signed)
Breathing stable.  On altace, lasix and aldactone.  Appears to be stable.  Follow.   

## 2021-10-29 NOTE — Assessment & Plan Note (Signed)
Doing well on current regimen.  Trazodone 2 q hs to help her sleep.  Denies any adverse effects. Follow.

## 2021-10-29 NOTE — Assessment & Plan Note (Signed)
No upper symptoms.  On prilosec.  

## 2021-10-29 NOTE — Assessment & Plan Note (Signed)
Blood pressure doing well as outlined.  Continue verapamil, aldactone, lasix and altace.   

## 2021-10-31 ENCOUNTER — Other Ambulatory Visit: Payer: Self-pay | Admitting: Internal Medicine

## 2021-10-31 NOTE — Telephone Encounter (Signed)
Rx ok'd for tramadol #60 with one refill.

## 2021-11-05 NOTE — Progress Notes (Signed)
Date:  11/06/2021   ID:  Charlotte Sessions, Charlotte Wagner, DOB [birth year], MRN [id number]  Patient Location:  [address] [place] [address]   Provider location:   [facility], [place] office  PCP:  [name], Charlotte Wagner  Cardiologist:  Arvid Right [facility]  Chief Complaint  Patient presents with   6 month follow up     Patient c/o shortness of breath with over exertion. Medications reviewed by the patient verbally.     History of Present Illness:    Charlotte Sessions, Charlotte Wagner is a 86 y.o. female  past medical history of atrial fibrillation accompanied by bradycardia.  coronary artery disease  prior bypass surgery 1998 stent to  graft 8/17  Echo EF 1/18 with EF 60-65%   moderate to severe left atrial enlargement cardioversion that did not hold,  reverted to atrial fibrillation Did not feel well on b-blocker, better on ca channel blocker Statin intolerance , on [facility] HTn Who presents for routine follow-up of her chronic stable angina, permanent atrial fibrillation   LOV 03/2021   Ms. Lyles reports having covid early Jan 2023 Lives at [facility], assisted living Fatigue better  Uses walker Compression hose bilaterally Swelling worse on the left than the right, chronic issue, stable  Stable weight On oxygen as needed, denies falls, Walks with a walker Reports blood pressure stable but low reading today, sometimes up to 130 when she checks it  Larynx paralyzed, difficulty talking Lab work reviewed Total cholesterol 143 LDL 44 A1C 6.3 HGB 12.8 GFR 44, CR 1.15  EKG personally reviewed by myself on todays visit  atrial fib rate 87 beats per minute ,poor R wave progression, no change from prior studies  Other past medical history reviewed echocardiogram 02/2020 Left ventricular ejection fraction, by estimation, is 60 to 65%. The  left ventricle has normal function. right ventricular size is moderately enlarged. severely elevated   pulmonary artery systolic pressure.  TR Peak grad:   61.8 mmHg  TR Vmax:        393.00 cm/s   Cath 03/31/2018 Significant underlying three-vessel coronary artery disease with patent grafts including LIMA to LAD, SVG to RCA, SVG to OM and SVG to diagonal.  Patent stent in the SVG to RCA.  Severe new stenosis and SVG to diagonal is likely the culprit. STENT RESOLUTE ONYX 4.0X15. to SVG to diag  hospital admission for acute on chronic diastolic and systolic CHF Cath 4/33/2951 .  Significant underlying three-vessel coronary artery disease with patent grafts including LIMA to LAD, SVG to RCA, SVG to OM and SVG to diagonal.  Patent stent in the SVG to RCA.  Severe new stenosis and SVG to diagonal is likely the culprit. 2.  Left ventricular angiography was not performed. 3.  High normal left ventricular end-diastolic pressure at 12 mmHg. 4.  Successful direct stenting of SVG to diagonal using a distal protection device.   ECHO Prior to cardiac catheterization and stent placement ejection fraction was in the range of 30% to 35%. Hypokinesis of the anteroseptal myocardium, apical,  anterior myocardium.  - Mitral valve: There was mild regurgitation. - Left atrium: The atrium was mildly to moderately dilated. - Right ventricle: Systolic function was moderately to   severely reduced. - Right atrium: The atrium was mildly dilated.  patent foramen ovale. - Tricuspid valve: moderate-severe regurgitation. - Pulmonary arteries: Systolic pressure was moderate to severely elevated PA peak pressure: 58 mm Hg (S).  long history of Statin intolerance, tried 4 different medications, had severe myalgias,  Lipitor,crestor, simvastatin all had severe side effects  on Zetia, tolerating this Cholesterol still above goal, LDL 112 on Zetia alone   History of bleeding from hemorrhoids. Stable recently   Carotid ultrasound Moderate atherosclerotic plaque over the left carotid bulb     Past Medical History:   Diagnosis Date   Allergy    Arthritis    s/p bilateral knees and left hip replacement   Asthma    CAD (coronary artery disease)    a. 12/1996 s/p CABG x4 [facility] [place]);  b. 2005 Pt reports stress test & cath, which revealed patent grafts.   Cancer [facility])    melanoma right arm   Carotid arterial disease ([place])    a. 04/2015 Carotid U/S: <50% bilat ICA stenosis.   Chronic kidney disease    Colon polyps    H/O   Depression    GERD (gastroesophageal reflux disease)    h/o hiatal hernia   Headache    migraines in past   Heart murmur    a. 04/2011 Echo: EF 55-60%, bilat atrial enlargement, mild to mod TR.   History of chicken pox    History of hiatal hernia    Hx of migraines    rare now   Hx: UTI (urinary tract infection)    Hyperlipidemia    a. Statin intolerant -->on zetia.   Hypertension    Hypertensive heart disease    Lichen planus    Melanoma (HCC)    Palpitations    a. rare PVC's and h/o SVT.   PMR (polymyalgia rheumatica) (HCC)    h/o in setting of crestor usage.   PSVT (paroxysmal supraventricular tachycardia) (HCC)    PUD (peptic ulcer disease)    remote history   Raynaud's phenomenon    Spinal stenosis    Urine incontinence    H/O   Vitamin D deficiency    Past Surgical History:  Procedure Laterality Date   ADENOIDECTOMY     age 51   BACK SURGERY     L3-L5   BILATERAL CARPAL TUNNEL RELEASE     BREAST BIOPSY Right    bx x 3 neg   BREAST SURGERY Right    biopsy x 3 (all benign)   CARDIAC CATHETERIZATION N/A 06/01/2016   Procedure: LEFT HEART CATH AND CORS/GRAFTS ANGIOGRAPHY;  Surgeon: Wellington Hampshire, Charlotte Wagner;  Location: [place] CV LAB;  Service: Cardiovascular;  Laterality: N/A;   CARDIAC CATHETERIZATION N/A 06/01/2016   Procedure: Coronary Stent Intervention;  Surgeon: Wellington Hampshire, Charlotte Wagner;  Location: [place] CV LAB;  Service: Cardiovascular;  Laterality: N/A;   CARDIOVERSION N/A 03/08/2017   Procedure: CARDIOVERSION;  Surgeon: Wellington Hampshire,  Charlotte Wagner;  Location: [facility] ORS;  Service: Cardiovascular;  Laterality: N/A;   CATARACT EXTRACTION W/PHACO Right 01/04/2016   Procedure: CATARACT EXTRACTION PHACO AND INTRAOCULAR LENS PLACEMENT ([place]);  Surgeon: Leandrew Koyanagi, Charlotte Wagner;  Location: [place];  Service: Ophthalmology;  Laterality: Right;  MALYUGIN   CATARACT EXTRACTION W/PHACO Left 01/25/2016   Procedure: CATARACT EXTRACTION PHACO AND INTRAOCULAR LENS PLACEMENT ([place]) left eye;  Surgeon: Leandrew Koyanagi, Charlotte Wagner;  Location: [place];  Service: Ophthalmology;  Laterality: Left;  MALYUGIN SHUGARCAINE   CHOLECYSTECTOMY  90's   COLONOSCOPY  03/19/2020   Procedure: COLONOSCOPY;  Surgeon: Virgel Manifold, Charlotte Wagner;  Location: [facility] ENDOSCOPY;  Service: Gastroenterology;;   COLONOSCOPY WITH PROPOFOL N/A 05/03/2015   Procedure: COLONOSCOPY WITH PROPOFOL;  Surgeon:  Lollie Sails, Charlotte Wagner;  Location: [facility] ENDOSCOPY;  Service: Endoscopy;  Laterality: N/A;   COLONOSCOPY WITH PROPOFOL N/A 03/19/2020   Procedure: COLONOSCOPY WITH PROPOFOL;  Surgeon: Virgel Manifold, Charlotte Wagner;  Location: [facility] ENDOSCOPY;  Service: Endoscopy;  Laterality: N/A;   CORONARY ARTERY BYPASS GRAFT  98   4 vessel   CORONARY ARTERY BYPASS GRAFT     x 4 age 81 per pt    CORONARY STENT INTERVENTION N/A 03/31/2018   Procedure: CORONARY STENT INTERVENTION;  Surgeon: Wellington Hampshire, Charlotte Wagner;  Location: [place] CV LAB;  Service: Cardiovascular;  Laterality: N/A;   ESOPHAGOGASTRODUODENOSCOPY N/A 03/22/2015   Procedure: ESOPHAGOGASTRODUODENOSCOPY (EGD);  Surgeon: Lollie Sails, Charlotte Wagner;  Location[facility] ENDOSCOPY;  Service: Endoscopy;  Laterality: N/A;   ESOPHAGOGASTRODUODENOSCOPY N/A 03/19/2020   Procedure: ESOPHAGOGASTRODUODENOSCOPY (EGD);  Surgeon: Virgel Manifold, Charlotte Wagner;  Location: [facility] ENDOSCOPY;  Service: Endoscopy;  Laterality: N/A;   HEMORRHOID BANDING     HEMORRHOID SURGERY N/A 02/17/2018   Procedure: HEMORRHOIDECTOMY;  Surgeon: Robert Bellow, Charlotte Wagner;  Location: [facility]  ORS;  Service: General;  Laterality: N/A;   JOINT REPLACEMENT     BILATERAL KNEE REPLACEMENTS   KNEE ARTHROSCOPY W/ OATS PROCEDURE     Lt knee (9/01), Rt knee (3/11), Lt hip (5/10)   LEFT HEART CATH AND CORONARY ANGIOGRAPHY N/A 03/31/2018   Procedure: LEFT HEART CATH AND CORONARY ANGIOGRAPHY;  Surgeon: Wellington Hampshire, Charlotte Wagner;  Location: [place] CV LAB;  Service: Cardiovascular;  Laterality: N/A;   TOTAL HIP ARTHROPLASTY       Current Meds  Medication Sig   acetaminophen (TYLENOL) 500 MG tablet Take 1,000 mg by mouth 2 (two) times daily.   amitriptyline (ELAVIL) 25 MG tablet TAKE 1 TABLET BY MOUTH AT BEDTIME   ATROVENT HFA 17 MCG/ACT inhaler INHALE 2 PUFFS INTO THE LUNGS EVERY 6 HOURS AS NEEDED FOR WHEEZING   b complex vitamins tablet Take 1 tablet by mouth daily.   Cholecalciferol (VITAMIN D3) 2000 UNITS TABS Take 2,000 Units by mouth 2 (two) times a week. Tuesday and Saturdays.   clotrimazole (MYCELEX) 10 MG troche Take 10 mg by mouth as needed.   Coenzyme Q10 (COQ-10) 200 MG CAPS Take 200 mg by mouth daily.    conjugated estrogens (PREMARIN) vaginal cream Apply 0.5mg  (pea-sized amount)  just inside the vaginal introitus with a finger-tip on  Monday, Wednesday and Friday nights. (Patient taking differently: Apply 0.5mg  (pea-sized amount)  just inside the vaginal introitus with a finger-tip on  Monday, Wednesday and Friday nights.)   CRANBERRY PO Take 1 capsule by mouth in the morning, at noon, and at bedtime.   ELIQUIS 5 MG TABS tablet TAKE 1 TABLET BY MOUTH TWICE DAILY   Evolocumab (REPATHA SURECLICK) 160 MG/ML SOAJ Inject 1 pen into the skin every 14 (fourteen) days.   ezetimibe (ZETIA) 10 MG tablet TAKE 1 TABLET BY MOUTH DAILY   furosemide (LASIX) 40 MG tablet Take 40 mg tablet in the am and 20 mg in the pm   hydrocortisone 2.5 % cream APPLY TO LICHEN PLANUS AS DIRECTED   Iron, Ferrous Sulfate, 325 (65 Fe) MG TABS Take 325 mg by mouth daily.   ketoconazole (NIZORAL) 2 % cream Apply  1 application topically daily.   loratadine (CLARITIN) 10 MG tablet Take 10 mg by mouth daily.   meclizine (ANTIVERT) 25 MG tablet Take 1 tablet (25 mg total) by mouth 3 (three) times daily as needed for dizziness.   Multiple Vitamins-Minerals (HAIR/SKIN/NAILS PO) Take  1 tablet by mouth daily.   mupirocin ointment (BACTROBAN) 2 % Apply to affected area bid   pantoprazole (PROTONIX) 40 MG tablet TAKE 1 TABLET BY MOUTH TWICE DAILY   polyethylene glycol powder (GLYCOLAX/MIRALAX) 17 GM/SCOOP powder Take 8.5 g by mouth daily. Hold if diarrhea   ramipril (ALTACE) 5 MG capsule TAKE 1 CAPSULE(5 MG) BY MOUTH DAILY   spironolactone (ALDACTONE) 25 MG tablet TAKE 1 TABLET BY MOUTH EVERY MORNING   traMADol (ULTRAM) 50 MG tablet TAKE ONE TABLET TWICE DAILY AS NEEDED   traZODone (DESYREL) 150 MG tablet TAKE 2 TABLETS BY MOUTH AT BEDTIME AS NEEDED FOR SLEEP   triamcinolone cream (KENALOG) 0.1 % Apply topically 2 (two) times daily.   verapamil (CALAN-SR) 180 MG CR tablet TAKE ONE (1) TABLET BY MOUTH TWO TIMES PER DAY   vitamin B-12 (CYANOCOBALAMIN) 1000 MCG tablet Take 1,000 mcg by mouth daily.   vitamin C (ASCORBIC ACID) 500 MG tablet Take 500 mg by mouth at bedtime.     Allergies:   Beta adrenergic blockers; Brilinta [ticagrelor]; Albuterol; Antihistamines, diphenhydramine-type; Aspirin; Nsaids; Penicillins; Statins; Sulfa antibiotics; Sulfasalazine; Tetracycline; and Tetracyclines & related   Social History   Tobacco Use   Smoking status: Former    Types: Cigarettes   Smokeless tobacco: Never   Tobacco comments:    quit 47+ yrs ago  Vaping Use   Vaping Use: Never used  Substance Use Topics   Alcohol use: No    Alcohol/week: 0.0 standard drinks   Drug use: No      Family Hx: The patient's family history includes Alcohol abuse in her father; Depression in her daughter, father, maternal grandmother, and son; Heart disease in her father; Hypertension in her maternal grandfather and paternal  grandfather; Lung cancer in her sister; Seizures in her son; Stroke in her maternal grandmother; Thyroid disease in her daughter and mother. There is no history of Breast cancer.  ROS:   Please see the history of present illness.    Review of Systems  Constitutional: Negative.   HENT: Negative.    Respiratory:  Positive for shortness of breath.   Cardiovascular:  Positive for leg swelling.  Gastrointestinal: Negative.   Musculoskeletal: Negative.        Gait instability  Neurological: Negative.   Psychiatric/Behavioral: Negative.    All other systems reviewed and are negative.   Labs/Other Tests and Data Reviewed:    Recent Labs: 06/22/2021: ALT 20; BUN 27; Creatinine, Ser 1.15; Hemoglobin 12.8; Platelets 234.0; Potassium 3.7; Sodium 137; TSH 1.36   Recent Lipid Panel Lab Results  Component Value Date/Time   CHOL 143 06/22/2021 02:27 PM   TRIG 197.0 (H) 06/22/2021 02:27 PM   HDL 60.00 06/22/2021 02:27 PM   CHOLHDL 2 06/22/2021 02:27 PM   LDLCALC 44 06/22/2021 02:27 PM   LDLDIRECT 126.7 07/03/2013 09:37 AM    Wt Readings from Last 3 Encounters:  11/06/21 183 lb 4 oz (83.1 kg)  06/22/21 186 lb (84.4 kg)  05/29/21 182 lb (82.6 kg)     Exam:    Vital Signs: Vital signs may also be detailed in the HPI BP 100/60 (BP Location: Left Arm, Patient Position: Sitting, Cuff Size: Normal)    Pulse 87    Ht 5\' 3"  (1.6 m)    Wt 183 lb 4 oz (83.1 kg)    SpO2 97%    BMI 32.46 kg/m    Constitutional:  oriented to person, place, and time. No distress.  HENT:  Head: Grossly  normal Eyes:  no discharge. No scleral icterus.  Neck: No JVD, no carotid bruits  Cardiovascular: Irregularly irregular no murmurs appreciated Pulmonary/Chest: Clear to auscultation bilaterally, no wheezes or rails Abdominal: Soft.  no distension.  no tenderness.  Musculoskeletal: Normal range of motion Neurological:  normal muscle tone. Coordination normal. No atrophy Skin: Skin warm and dry Psychiatric: normal  affect, pleasant  ASSESSMENT & PLAN:    Persistent atrial fibrillation Rate controlled on anticoagulation, no recent falls, hemoglobin stable Continue current regimen  Pulmonary hypertension/diastolic CHF  Lasix  40 in Am, 20 in PM Periodically takes extra Lasix Discussed climbing right heart pressures over the past 5 to 6 years, started at 66 now up to more than 64 Again she does not want referral for advanced therapies Reports that she feels well, having to take her Lasix  Chronic diastolic heart failure (HCC)  Lasix 40 in the mornings, 20 mg in the p.m. Extra Lasix as needed Recent lab work reviewed, stable renal function  Coronary artery disease of native artery of native heart with stable angina pectoris ([place]) Currently with no symptoms of angina. No further workup at this time. Continue current medication regimen.  Hypercholesterolemia Cholesterol is at goal on the current lipid regimen. No changes to the medications were made.  Essential hypertension, benign Blood pressure is well controlled on today's visit. No changes made to the medications.   Dyspnea, unspecified type  pulmonary hypertension, kyphosis, deconditioning, atrial fibrillation Legs weak, walks with walker Recent COVID but recovered  TGA (transient global amnesia) Denies any further episodes, cognition stable stable   Total encounter time more than 25 minutes  Greater than 50% was spent in counseling and coordination of care with the patient    Signed, Ida Rogue, Charlotte Wagner  11/06/2021 2:09 PM    [place] [facility] [address], [place], [place] [address]

## 2021-11-06 ENCOUNTER — Other Ambulatory Visit: Payer: Self-pay

## 2021-11-06 ENCOUNTER — Encounter: Payer: Self-pay | Admitting: Cardiovascular Disease

## 2021-11-06 ENCOUNTER — Ambulatory Visit (INDEPENDENT_AMBULATORY_CARE_PROVIDER_SITE_OTHER): Payer: Medicare Other | Admitting: Cardiovascular Disease

## 2021-11-06 VITALS — BP 100/60 | HR 87 | Ht 63.0 in | Wt 183.2 lb

## 2021-11-06 DIAGNOSIS — I4819 Other persistent atrial fibrillation: Secondary | ICD-10-CM

## 2021-11-06 DIAGNOSIS — I5032 Chronic diastolic (congestive) heart failure: Secondary | ICD-10-CM | POA: Diagnosis not present

## 2021-11-06 DIAGNOSIS — I272 Pulmonary hypertension, unspecified: Secondary | ICD-10-CM | POA: Diagnosis not present

## 2021-11-06 DIAGNOSIS — I071 Rheumatic tricuspid insufficiency: Secondary | ICD-10-CM

## 2021-11-06 DIAGNOSIS — E785 Hyperlipidemia, unspecified: Secondary | ICD-10-CM

## 2021-11-06 DIAGNOSIS — I059 Rheumatic mitral valve disease, unspecified: Secondary | ICD-10-CM | POA: Diagnosis not present

## 2021-11-06 DIAGNOSIS — I25118 Atherosclerotic heart disease of native coronary artery with other forms of angina pectoris: Secondary | ICD-10-CM | POA: Diagnosis not present

## 2021-11-06 DIAGNOSIS — I10 Essential (primary) hypertension: Secondary | ICD-10-CM | POA: Diagnosis not present

## 2021-11-06 NOTE — Patient Instructions (Signed)
Medication Instructions:  No changes  If you need a refill on your cardiac medications before your next appointment, please call your pharmacy.   Lab work: No new labs needed  Testing/Procedures: No new testing needed  Follow-Up: At [facility], you and your health needs are our priority.  As part of our continuing mission to provide you with exceptional heart care, we have created designated Provider Care Teams.  These Care Teams include your primary Cardiologist (physician) and Advanced Practice Providers (APPs -  Physician Assistants and Nurse Practitioners) who all work together to provide you with the care you need, when you need it.  You will need a follow up appointment in 6 months, App ok  Providers on your designated Care Team:   Murray Hodgkins, NP Christell Faith, PA-C Cadence Kathlen Mody, [place]  COVID-19 Vaccine Information can be found at: [email or web address].uk For questions related to vaccine distribution or appointments, please email vaccine@[email or web address] or call 2497403859.

## 2021-11-07 ENCOUNTER — Other Ambulatory Visit: Payer: Self-pay | Admitting: Cardiovascular Disease

## 2021-11-07 ENCOUNTER — Other Ambulatory Visit: Payer: Self-pay

## 2021-11-07 NOTE — Telephone Encounter (Signed)
*  STAT* If patient is at the pharmacy, call can be transferred to refill team.   1. Which medications need to be refilled? (please list name of each medication and dose if known) Zetia  2. Which pharmacy/location (including street and city if local pharmacy) is medication to be sent to? [facility]  3. Do they need a 30 day or 90 day supply? [place]

## 2021-11-21 ENCOUNTER — Other Ambulatory Visit: Payer: Self-pay | Admitting: Internal Medicine

## 2021-12-05 ENCOUNTER — Other Ambulatory Visit: Payer: Self-pay | Admitting: Internal Medicine

## 2021-12-14 DIAGNOSIS — L89311 Pressure ulcer of right buttock, stage 1: Secondary | ICD-10-CM | POA: Diagnosis not present

## 2021-12-14 DIAGNOSIS — L89321 Pressure ulcer of left buttock, stage 1: Secondary | ICD-10-CM | POA: Diagnosis not present

## 2021-12-14 DIAGNOSIS — L821 Other seborrheic keratosis: Secondary | ICD-10-CM | POA: Diagnosis not present

## 2021-12-26 ENCOUNTER — Other Ambulatory Visit: Payer: Self-pay | Admitting: Cardiovascular Disease

## 2021-12-26 ENCOUNTER — Telehealth: Payer: Self-pay | Admitting: Internal Medicine

## 2021-12-26 DIAGNOSIS — N390 Urinary tract infection, site not specified: Secondary | ICD-10-CM | POA: Diagnosis not present

## 2021-12-26 NOTE — Telephone Encounter (Signed)
Pt called about medication-amitriptyline being sent to [facility]. Pt stated that she is only getting 25 mg tablet and not 50 mg tablet ?

## 2021-12-27 NOTE — Telephone Encounter (Signed)
For years, patient has taken amitriptyline 50 mg qhs. In 2015 it was written 50 mg qhs then at some point directions changed to 25 mg 1-2 tabs qhs. Patient would like to have the 50 mg tablets sent in so she can take 1 qhs. Ok to send to [facility] for her?  ?

## 2021-12-27 NOTE — Telephone Encounter (Signed)
In reviewing chart, it appear that we have '25mg'$  one q hs.  Has she been taking 2 q hs.  (Given interaction with tramadol, etc) - had discussed lower dose. It appears she has only been getting 30 tablets and taking one q hs.   ?

## 2022-01-05 NOTE — Telephone Encounter (Signed)
Spoke with patient. She is doing ok on 25 mg qhs. Apologized for the confusion,. She has appt on Monday. Nothing further needed. ?

## 2022-01-08 ENCOUNTER — Ambulatory Visit (INDEPENDENT_AMBULATORY_CARE_PROVIDER_SITE_OTHER): Payer: Medicare Other | Admitting: Internal Medicine

## 2022-01-08 ENCOUNTER — Telehealth: Payer: Self-pay | Admitting: *Deleted

## 2022-01-08 VITALS — BP 104/70 | HR 94 | Temp 98.0°F | Resp 16 | Ht 63.0 in | Wt 184.6 lb

## 2022-01-08 DIAGNOSIS — R739 Hyperglycemia, unspecified: Secondary | ICD-10-CM

## 2022-01-08 DIAGNOSIS — I5032 Chronic diastolic (congestive) heart failure: Secondary | ICD-10-CM | POA: Diagnosis not present

## 2022-01-08 DIAGNOSIS — D649 Anemia, unspecified: Secondary | ICD-10-CM

## 2022-01-08 DIAGNOSIS — I10 Essential (primary) hypertension: Secondary | ICD-10-CM | POA: Diagnosis not present

## 2022-01-08 DIAGNOSIS — I25118 Atherosclerotic heart disease of native coronary artery with other forms of angina pectoris: Secondary | ICD-10-CM

## 2022-01-08 DIAGNOSIS — I4819 Other persistent atrial fibrillation: Secondary | ICD-10-CM | POA: Diagnosis not present

## 2022-01-08 DIAGNOSIS — Z860100 Personal history of colon polyps, unspecified: Secondary | ICD-10-CM

## 2022-01-08 DIAGNOSIS — J4520 Mild intermittent asthma, uncomplicated: Secondary | ICD-10-CM | POA: Diagnosis not present

## 2022-01-08 DIAGNOSIS — E7800 Pure hypercholesterolemia, unspecified: Secondary | ICD-10-CM | POA: Diagnosis not present

## 2022-01-08 DIAGNOSIS — F32A Depression, unspecified: Secondary | ICD-10-CM

## 2022-01-08 DIAGNOSIS — K2100 Gastro-esophageal reflux disease with esophagitis, without bleeding: Secondary | ICD-10-CM | POA: Diagnosis not present

## 2022-01-08 DIAGNOSIS — R5383 Other fatigue: Secondary | ICD-10-CM | POA: Diagnosis not present

## 2022-01-08 DIAGNOSIS — M4800 Spinal stenosis, site unspecified: Secondary | ICD-10-CM | POA: Diagnosis not present

## 2022-01-08 NOTE — Progress Notes (Signed)
Patient ID: Charlotte Sessions, MD, female   DOB: June 12, 1936, 87 y.o.   MRN: [id number] ? ? ?Subjective:  ? ? Patient ID: Charlotte Sessions, MD, female    DOB: 07-24-1936, 86 y.o.   MRN: [id number] ? ?This visit occurred during the SARS-CoV-2 public health emergency.  Safety protocols were in place, including screening questions prior to the visit, additional usage of staff PPE, and extensive cleaning of exam room while observing appropriate contact time as indicated for disinfecting solutions.  ? ?Patient here for a scheduled follow up .  ? ?HPI ?Here to follow up regarding CAD, afib, hypertension and hypercholesterolemia.  Saw Dr Rockey Situ 11/06/21.  Felt stable.  No changes made.  Taking lasix.  No chest pain.  Still with sob with exertion.  Breathing overall stable.  No increased cough or congestion.  No increased acid reflux.  No abdominal pain.  Handling stress.   ? ? ?Past Medical History:  ?Diagnosis Date  ? Allergy   ? Arthritis   ? s/p bilateral knees and left hip replacement  ? Asthma   ? CAD (coronary artery disease)   ? a. 12/1996 s/p CABG x4 [facility], [place]);  b. 2005 Pt reports stress test & cath, which revealed patent grafts.  ? Cancer ([place])   ? melanoma right arm  ? Carotid arterial disease ([place])   ? a. 04/2015 Carotid U/S: <50% bilat ICA stenosis.  ? Chronic kidney disease   ? Colon polyps   ? H/O  ? Depression   ? GERD (gastroesophageal reflux disease)   ? h/o hiatal hernia  ? Headache   ? migraines in past  ? Heart murmur   ? a. 04/2011 Echo: EF 55-60%, bilat atrial enlargement, mild to mod TR.  ? History of chicken pox   ? History of hiatal hernia   ? Hx of migraines   ? rare now  ? Hx: UTI (urinary tract infection)   ? Hyperlipidemia   ? a. Statin intolerant -->on zetia.  ? Hypertension   ? Hypertensive heart disease   ? Lichen planus   ? Melanoma ([place])   ? Palpitations   ? a. rare PVC's and h/o SVT.  ? PMR (polymyalgia rheumatica) (H[facility])   ? h/o in setting of crestor usage.  ? PSVT (paroxysmal supraventricular  tachycardia) ([place])   ? PUD (peptic ulcer disease)   ? remote history  ? Raynaud's phenomenon   ? Spinal stenosis   ? Urine incontinence   ? H/O  ? Vitamin D deficiency   ? ?Past Surgical History:  ?Procedure Laterality Date  ? ADENOIDECTOMY    ? age 70  ? BACK SURGERY    ? L3-L5  ? BILATERAL CARPAL TUNNEL RELEASE    ? BREAST BIOPSY Right   ? bx x 3 neg  ? BREAST SURGERY Right   ? biopsy x 3 (all benign)  ? CARDIAC CATHETERIZATION N/A 06/01/2016  ? Procedure: LEFT HEART CATH AND CORS/GRAFTS ANGIOGRAPHY;  Surgeon: Wellington Hampshire, MD;  Location: [place];  Service: Cardiovascular;  Laterality: N/A;  ? CARDIAC CATHETERIZATION N/A 06/01/2016  ? Procedure: Coronary Stent Intervention;  Surgeon: Wellington Hampshire, MD;  Location: [place] [facility];  Service: Cardiovascular;  Laterality: N/A;  ? CARDIOVERSION N/A 03/08/2017  ? Procedure: CARDIOVERSION;  Surgeon: Wellington Hampshire, MD;  Location: [facility];  Service: Cardiovascular;  Laterality: N/A;  ? CATARACT EXTRACTION W/PHACO Right 01/04/2016  ? Procedure: CATARACT EXTRACTION PHACO AND INTRAOCULAR LENS PLACEMENT (IOC);  Surgeon: Leandrew Koyanagi, MD;  Location: [facility];  Service: Ophthalmology;  Laterality: Right;  MALYUGIN  ? CATARACT EXTRACTION W/PHACO Left 01/25/2016  ? Procedure: CATARACT EXTRACTION PHACO AND INTRAOCULAR LENS PLACEMENT ([place]) left eye;  Surgeon: Leandrew Koyanagi, MD;  Location: [facility];  Service: Ophthalmology;  Laterality: Left;  MALYUGIN SHUGARCAINE  ? CHOLECYSTECTOMY  90's  ? COLONOSCOPY  03/19/2020  ? Procedure: COLONOSCOPY;  Surgeon: Virgel Manifold, MD;  Location: [facility];  Service: Gastroenterology;;  ? COLONOSCOPY WITH PROPOFOL N/A 05/03/2015  ? Procedure: COLONOSCOPY WITH PROPOFOL;  Surgeon: Lollie Sails, MD;  Location: [facility]COPY;  Service: Endoscopy;  Laterality: N/A;  ? COLONOSCOPY WITH PROPOFOL N/A 03/19/2020  ? Procedure: COLONOSCOPY WITH PROPOFOL;  Surgeon: Virgel Manifold,  MD;  Location: [facility];  Service: Endoscopy;  Laterality: N/A;  ? CORONARY ARTERY BYPASS GRAFT  98  ? 4 vessel  ? CORONARY ARTERY BYPASS GRAFT    ? x 4 age 20 per pt   ? CORONARY STENT INTERVENTION N/A 03/31/2018  ? Procedure: CORONARY STENT INTERVENTION;  Surgeon: Wellington Hampshire, MD;  Location: [place] [facility];  Service: Cardiovascular;  Laterality: N/A;  ? ESOPHAGOGASTRODUODENOSCOPY N/A 03/22/2015  ? Procedure: ESOPHAGOGASTRODUODENOSCOPY (EGD);  Surgeon: Lollie Sails, MD;  Location: [facility];  Service: Endoscopy;  Laterality: N/A;  ? ESOPHAGOGASTRODUODENOSCOPY N/A 03/19/2020  ? Procedure: ESOPHAGOGASTRODUODENOSCOPY (EGD);  Surgeon: Virgel Manifold, MD;  Location: [facility] ENDOSCOPY;  Service: Endoscopy;  Laterality: N/A;  ? HEMORRHOID BANDING    ? HEMORRHOID SURGERY N/A 02/17/2018  ? Procedure: HEMORRHOIDECTOMY;  Surgeon: Robert Bellow, MD;  Location: [facility];  Service: General;  Laterality: N/A;  ? JOINT REPLACEMENT    ? BILATERAL KNEE REPLACEMENTS  ? KNEE ARTHROSCOPY W/ OATS PROCEDURE    ? Lt knee (9/01), Rt knee (3/11), Lt hip (5/10)  ? LEFT HEART CATH AND CORONARY ANGIOGRAPHY N/A 03/31/2018  ? Procedure: LEFT HEART CATH AND CORONARY ANGIOGRAPHY;  Surgeon: Wellington Hampshire, MD;  Location: [place] [facility];  Service: Cardiovascular;  Laterality: N/A;  ? TOTAL HIP ARTHROPLASTY    ? ?Family History  ?Problem Relation Age of Onset  ? Thyroid disease Mother   ?     graves disease  ? Heart disease Father   ?     rheumatic heart  ? Alcohol abuse Father   ? Depression Father   ? Lung cancer Sister   ? Depression Daughter   ? Thyroid disease Daughter   ?     hashimoto  ? Depression Son   ? Stroke Maternal Grandmother   ? Depression Maternal Grandmother   ? Hypertension Maternal Grandfather   ? Hypertension Paternal Grandfather   ? Seizures Son   ? Breast cancer Neg Hx   ? ?Social History  ? ?Socioeconomic History  ? Marital status: Divorced  ?  Spouse name: Not on file  ? Number of  children: 3  ? Years of education: Not on file  ? Highest education level: Not on file  ?Occupational History  ? Occupation: Retired Sport and exercise psychologist  ?Tobacco Use  ? Smoking status: Former  ?  Types: Cigarettes  ? Smokeless tobacco: Never  ? Tobacco comments:  ?  quit 47+ yrs ago  ?Vaping Use  ? Vaping Use: Never used  ?Substance and Sexual Activity  ? Alcohol use: No  ?  Alcohol/week: 0.0 standard drinks  ? Drug use: No  ? Sexual activity: Never  ?Other Topics Concern  ? Not  on file  ?Social History Narrative  ? Lives in [place].  Retired Engineer, drilling.  Relatively active.  ? Uses a Rollator to ambulate  ? ?Social Determinants of Health  ? ?Financial Resource Strain: Low Risk   ? Difficulty of Paying Living Expenses: Not hard at all  ?Food Insecurity: No Food Insecurity  ? Worried About Charity fundraiser in the Last Year: Never true  ? Ran Out of Food in the Last Year: Never true  ?Transportation Needs: No Transportation Needs  ? Lack of Transportation (Medical): No  ? Lack of Transportation (Non-Medical): No  ?Physical Activity: Sufficiently Active  ? Days of Exercise per Week: 5 days  ? Minutes of Exercise per Session: 30 min  ?Stress: No Stress Concern Present  ? Feeling of Stress : Not at all  ?Social Connections: Unknown  ? Frequency of Communication with Friends and Family: More than three times a week  ? Frequency of Social Gatherings with Friends and Family: Twice a week  ? Attends Religious Services: Not on file  ? Active Member of Clubs or Organizations: Not on file  ? Attends Archivist Meetings: Not on file  ? Marital Status: Not on file  ? ? ? ?Review of Systems  ?Constitutional:  Negative for appetite change and unexpected weight change.  ?HENT:  Negative for congestion and sinus pressure.   ?Respiratory:  Negative for cough and chest tightness.   ?     SOB with exertion.  Breathing stable.    ?Cardiovascular:  Negative for chest pain, palpitations and leg swelling.  ?Gastrointestinal:   Negative for abdominal pain, diarrhea, nausea and vomiting.  ?Genitourinary:  Negative for difficulty urinating and dysuria.  ?Musculoskeletal:  Negative for joint swelling and myalgias.  ?Skin:  Negative for colo

## 2022-01-08 NOTE — Telephone Encounter (Signed)
PA required for Repatha 140 mg/ml inj. ?PA has been submitted via covermymeds. ?Awaiting approval. ? ?[employer] [facility] has not replied to your PA request. Turnaround time for review of a PA request is dependent upon insurance plan and can range from 24 hours to 5 calendar days. ? ?You may close this dialog, return to your dashboard, and perform other tasks. To check for an update later, click on the "[employer] Detailed Status" located in the upper right corner of your dashboard. ? ?If this search option is not available or [facility] [facility] has not replied to your request within this timeframe, please contact the number on the back of the patient's insurance card. ?

## 2022-01-09 ENCOUNTER — Telehealth: Payer: Self-pay | Admitting: Emergency Medicine

## 2022-01-09 MED ORDER — REPATHA SURECLICK 140 MG/ML ~~LOC~~ SOAJ: 1.0000 "pen " | SUBCUTANEOUS | 12 refills | Status: DC

## 2022-01-09 NOTE — Telephone Encounter (Signed)
Incoming fax received from [facility]. ? ?Marine scientist for [place] included in fax. Patient portions of application filled out and signed.  ? ?Prescribing physician information filled out and signed by MD. Virgel Gess to [employer] with required information (medications, allergies, and script).  ? ?Application placed in file cabinet.  ?

## 2022-01-11 DIAGNOSIS — I10 Essential (primary) hypertension: Secondary | ICD-10-CM | POA: Diagnosis not present

## 2022-01-11 DIAGNOSIS — R739 Hyperglycemia, unspecified: Secondary | ICD-10-CM | POA: Diagnosis not present

## 2022-01-11 DIAGNOSIS — E7800 Pure hypercholesterolemia, unspecified: Secondary | ICD-10-CM | POA: Diagnosis not present

## 2022-01-11 LAB — HEPATIC FUNCTION PANEL
ALT: 20 U/L (ref 7–35)
AST: 21 (ref 13–35)
Alkaline Phosphatase: 46 (ref 25–125)
Bilirubin, Direct: 0.2 (ref 0.01–0.4)
Bilirubin, Total: 0.7

## 2022-01-11 LAB — COMPREHENSIVE METABOLIC PANEL
Albumin: 4.5 (ref 3.5–5.0)
Calcium: 10.1 (ref 8.7–10.7)
Globulin: 2.1
eGFR: 46

## 2022-01-11 LAB — BASIC METABOLIC PANEL
BUN: 27 — AB (ref 4–21)
CO2: 30 — AB (ref 13–22)
Chloride: 98 — AB (ref 99–108)
Creatinine: 1.2 — AB (ref 0.5–1.1)
Glucose: 97
Potassium: 4.4 mEq/L (ref 3.5–5.1)
Sodium: 134 — AB (ref 137–147)

## 2022-01-11 LAB — TSH: TSH: 1.58 (ref 0.41–5.90)

## 2022-01-11 LAB — HEMOGLOBIN A1C: Hemoglobin A1C: 5.7

## 2022-01-13 ENCOUNTER — Encounter: Payer: Self-pay | Admitting: Internal Medicine

## 2022-01-13 NOTE — Assessment & Plan Note (Signed)
Followed by cardiology.  On eliquis.   Stable.  No changes made.   

## 2022-01-13 NOTE — Assessment & Plan Note (Signed)
Low carb diet and exercise.  Follow met b and a1c.  ?

## 2022-01-13 NOTE — Assessment & Plan Note (Signed)
Follow cbc.  

## 2022-01-13 NOTE — Assessment & Plan Note (Addendum)
Doing well on current regimen.  Continue trazodone.  Denies any adverse effects. Follow.   

## 2022-01-13 NOTE — Assessment & Plan Note (Signed)
Chronic back and leg pain.  Requires tramadol and tylenol to function.  Walks with rollator.  Stable.   

## 2022-01-13 NOTE — Assessment & Plan Note (Signed)
Breathing stable.  On altace, lasix and aldactone.  Appears to be stable.  Follow.   

## 2022-01-13 NOTE — Assessment & Plan Note (Signed)
Had colonoscopy 03/19/20.  No bleeding reported.  ?

## 2022-01-13 NOTE — Assessment & Plan Note (Signed)
Breathing stable.

## 2022-01-13 NOTE — Assessment & Plan Note (Signed)
No upper symptoms.  On prilosec.  

## 2022-01-13 NOTE — Assessment & Plan Note (Signed)
On repatha.  Follow lipid panel.  

## 2022-01-13 NOTE — Assessment & Plan Note (Signed)
Followed by cardiology.  On eliquis.  Stable.   

## 2022-01-13 NOTE — Assessment & Plan Note (Signed)
Blood pressure doing well as outlined.  Continue verapamil, aldactone, lasix and altace.   

## 2022-01-17 ENCOUNTER — Telehealth: Payer: Self-pay | Admitting: Internal Medicine

## 2022-01-17 NOTE — Telephone Encounter (Signed)
Pt need a refill on conjugated estrogens [facility] ?

## 2022-01-18 ENCOUNTER — Other Ambulatory Visit: Payer: Self-pay

## 2022-01-19 ENCOUNTER — Other Ambulatory Visit: Payer: Self-pay

## 2022-01-19 NOTE — Telephone Encounter (Signed)
[facility] ?

## 2022-01-25 ENCOUNTER — Other Ambulatory Visit: Payer: Self-pay | Admitting: Cardiovascular Disease

## 2022-01-25 ENCOUNTER — Other Ambulatory Visit: Payer: Self-pay | Admitting: Internal Medicine

## 2022-01-26 NOTE — Telephone Encounter (Signed)
Rx ok'd for amitriptyline and tramadol.  ?

## 2022-01-29 NOTE — Telephone Encounter (Signed)
Message from Plan ?Repatha 140 mg/ml inj. Has bee approved. ?Effective from 01/08/2022 through 01/09/2023. ?

## 2022-02-14 DIAGNOSIS — I7091 Generalized atherosclerosis: Secondary | ICD-10-CM | POA: Diagnosis not present

## 2022-02-14 DIAGNOSIS — L603 Nail dystrophy: Secondary | ICD-10-CM | POA: Diagnosis not present

## 2022-02-14 DIAGNOSIS — R600 Localized edema: Secondary | ICD-10-CM | POA: Diagnosis not present

## 2022-02-28 ENCOUNTER — Other Ambulatory Visit: Payer: Self-pay | Admitting: Internal Medicine

## 2022-02-28 ENCOUNTER — Other Ambulatory Visit: Payer: Self-pay | Admitting: Cardiovascular Disease

## 2022-02-28 NOTE — Telephone Encounter (Signed)
Pt last saw Dr Rockey Situ 11/06/21, last labs 08/14/21 Creat 1.10, age 86, weight 83.7kg, based on specified criteria pt is on appropriate dosage of Eliquis '5mg'$  BID for afib.  Will refill rx.

## 2022-03-01 DIAGNOSIS — E7800 Pure hypercholesterolemia, unspecified: Secondary | ICD-10-CM | POA: Diagnosis not present

## 2022-03-01 DIAGNOSIS — J45909 Unspecified asthma, uncomplicated: Secondary | ICD-10-CM | POA: Diagnosis not present

## 2022-03-01 DIAGNOSIS — R739 Hyperglycemia, unspecified: Secondary | ICD-10-CM | POA: Diagnosis not present

## 2022-03-01 DIAGNOSIS — D649 Anemia, unspecified: Secondary | ICD-10-CM | POA: Diagnosis not present

## 2022-03-01 DIAGNOSIS — I259 Chronic ischemic heart disease, unspecified: Secondary | ICD-10-CM | POA: Diagnosis not present

## 2022-03-01 DIAGNOSIS — I10 Essential (primary) hypertension: Secondary | ICD-10-CM | POA: Diagnosis not present

## 2022-03-01 DIAGNOSIS — I739 Peripheral vascular disease, unspecified: Secondary | ICD-10-CM | POA: Diagnosis not present

## 2022-03-01 DIAGNOSIS — N189 Chronic kidney disease, unspecified: Secondary | ICD-10-CM | POA: Diagnosis not present

## 2022-03-08 ENCOUNTER — Other Ambulatory Visit: Payer: Self-pay | Admitting: Internal Medicine

## 2022-03-09 NOTE — Telephone Encounter (Signed)
Rx ok'd for tramadol #60 with 2 refills.  PDMP reviewed. Picked up last refill 03/08/22.  Refills to be placed on file.

## 2022-03-13 ENCOUNTER — Other Ambulatory Visit: Payer: Self-pay | Admitting: Internal Medicine

## 2022-03-21 ENCOUNTER — Other Ambulatory Visit: Payer: Self-pay

## 2022-03-21 MED ORDER — TRAZODONE HCL 150 MG PO TABS
ORAL_TABLET | ORAL | 2 refills | Status: DC
Start: 2022-03-21 — End: 2022-09-20

## 2022-03-27 ENCOUNTER — Other Ambulatory Visit: Payer: Self-pay | Admitting: Nurse Practitioner

## 2022-03-27 ENCOUNTER — Other Ambulatory Visit: Payer: Self-pay | Admitting: Cardiovascular Disease

## 2022-03-27 NOTE — Telephone Encounter (Signed)
Please schedule 6 month F/U for 90 day refills. Thank you! 

## 2022-04-02 ENCOUNTER — Telehealth: Payer: Self-pay | Admitting: Internal Medicine

## 2022-04-02 NOTE — Telephone Encounter (Signed)
Charlotte Wagner from [facility] called stating pt need a refill on spironolactone [facility]

## 2022-04-03 ENCOUNTER — Other Ambulatory Visit: Payer: Self-pay

## 2022-04-03 ENCOUNTER — Other Ambulatory Visit: Payer: Self-pay | Admitting: Cardiovascular Disease

## 2022-04-03 MED ORDER — SPIRONOLACTONE 25 MG PO TABS: 25.0000 mg | ORAL_TABLET | Freq: Every morning | ORAL | 0 refills | Status: DC

## 2022-04-06 ENCOUNTER — Other Ambulatory Visit: Payer: Self-pay

## 2022-04-12 DIAGNOSIS — N390 Urinary tract infection, site not specified: Secondary | ICD-10-CM | POA: Diagnosis not present

## 2022-04-16 ENCOUNTER — Telehealth: Payer: Self-pay | Admitting: Internal Medicine

## 2022-04-16 NOTE — Telephone Encounter (Signed)
Pt called in requesting for an urinalysis... Pt stated that Paulla Dolly from [facility] was suppose to reach out to Dr. Nicki Reaper about the urinalysis... Pt stated that she is having frequent urination... Pt requesting callback

## 2022-04-16 NOTE — Telephone Encounter (Signed)
S/w pt - stated that Paulla Dolly, RN for [facility] had requested for urinalysis for pt due to some frequency she was having. Pt was not having any other symptoms - no burning, bladder pressure, fever, chills, etc.  Per Dr Delene Loll, request was sent to wrong physician - a Dr Faylene Million ...  Pt stated she does not need urinalysis, had one with another one of her physicians and it came back normal.

## 2022-04-19 DIAGNOSIS — I7091 Generalized atherosclerosis: Secondary | ICD-10-CM | POA: Diagnosis not present

## 2022-04-25 ENCOUNTER — Other Ambulatory Visit: Payer: Self-pay | Admitting: Cardiovascular Disease

## 2022-05-10 ENCOUNTER — Ambulatory Visit (INDEPENDENT_AMBULATORY_CARE_PROVIDER_SITE_OTHER): Payer: Medicare Other | Admitting: Internal Medicine

## 2022-05-10 ENCOUNTER — Encounter: Payer: Self-pay | Admitting: Internal Medicine

## 2022-05-10 VITALS — BP 108/64 | HR 62 | Temp 97.7°F | Resp 19 | Ht 63.0 in | Wt 180.0 lb

## 2022-05-10 DIAGNOSIS — Z86010 Personal history of colonic polyps: Secondary | ICD-10-CM

## 2022-05-10 DIAGNOSIS — J4520 Mild intermittent asthma, uncomplicated: Secondary | ICD-10-CM

## 2022-05-10 DIAGNOSIS — M4800 Spinal stenosis, site unspecified: Secondary | ICD-10-CM

## 2022-05-10 DIAGNOSIS — I4819 Other persistent atrial fibrillation: Secondary | ICD-10-CM

## 2022-05-10 DIAGNOSIS — R739 Hyperglycemia, unspecified: Secondary | ICD-10-CM

## 2022-05-10 DIAGNOSIS — I5032 Chronic diastolic (congestive) heart failure: Secondary | ICD-10-CM | POA: Diagnosis not present

## 2022-05-10 DIAGNOSIS — E7800 Pure hypercholesterolemia, unspecified: Secondary | ICD-10-CM | POA: Diagnosis not present

## 2022-05-10 DIAGNOSIS — Z7185 Encounter for immunization safety counseling: Secondary | ICD-10-CM

## 2022-05-10 DIAGNOSIS — F32A Depression, unspecified: Secondary | ICD-10-CM

## 2022-05-10 DIAGNOSIS — K623 Rectal prolapse: Secondary | ICD-10-CM | POA: Diagnosis not present

## 2022-05-10 DIAGNOSIS — I25118 Atherosclerotic heart disease of native coronary artery with other forms of angina pectoris: Secondary | ICD-10-CM

## 2022-05-10 DIAGNOSIS — I10 Essential (primary) hypertension: Secondary | ICD-10-CM | POA: Diagnosis not present

## 2022-05-10 DIAGNOSIS — L989 Disorder of the skin and subcutaneous tissue, unspecified: Secondary | ICD-10-CM

## 2022-05-10 DIAGNOSIS — K2100 Gastro-esophageal reflux disease with esophagitis, without bleeding: Secondary | ICD-10-CM

## 2022-05-10 DIAGNOSIS — D649 Anemia, unspecified: Secondary | ICD-10-CM | POA: Diagnosis not present

## 2022-05-10 NOTE — Progress Notes (Signed)
Patient ID: [name], MD, female   DOB: 1936/04/15, 86 y.o.   MRN: [id number]   Subjective:    Patient ID: [name], MD, female    DOB: 07-19-1936, 86 y.o.   MRN: [id number]    Patient here for a scheduled follow up .   HPI Here to follow up regarding CKD, blood pressure and hypercholesterolemia.  Reports breathing relatively stable.  Still with sob with exertion.  She still gets out and walks. Uses her walking.  No chest pin.  No increased cough or congestion.  No acid reflux reported.  No abdominal pain.  Does report two sores - buttock.  Using aquaphor and changing dressings.  Healing.  No surrounding redness.  Sleeping - recliner.  Discussed keeping pressure off the area.  Takes a second tramadol - approximately 4x/week.  Handling stress.     Past Medical History:  Diagnosis Date   Allergy    Arthritis    s/p bilateral knees and left hip replacement   Asthma    CAD (coronary artery disease)    a. 12/1996 s/p CABG x4 [facility], [place]);  b. 2005 Pt reports stress test & cath, which revealed patent grafts.   Cancer [facility])    melanoma right arm   Carotid arterial disease ([place])    a. 04/2015 Carotid U/S: <50% bilat ICA stenosis.   Chronic kidney disease    Colon polyps    H/O   Depression    GERD (gastroesophageal reflux disease)    h/o hiatal hernia   Headache    migraines in past   Heart murmur    a. 04/2011 Echo: EF 55-60%, bilat atrial enlargement, mild to mod TR.   History of chicken pox    History of hiatal hernia    Hx of migraines    rare now   Hx: UTI (urinary tract infection)    Hyperlipidemia    a. Statin intolerant -->on zetia.   Hypertension    Hypertensive heart disease    Lichen planus    Melanoma (HCC)    Palpitations    a. rare PVC's and h/o SVT.   PMR (polymyalgia rheumatica) (HCC)    h/o in setting of crestor usage.   PSVT (paroxysmal supraventricular tachycardia) (HCC)    PUD (peptic ulcer disease)    remote history   Raynaud's phenomenon     Spinal stenosis    Urine incontinence    H/O   Vitamin D deficiency    Past Surgical History:  Procedure Laterality Date   ADENOIDECTOMY     age 8   BACK SURGERY     L3-L5   BILATERAL CARPAL TUNNEL RELEASE     BREAST BIOPSY Right    bx x 3 neg   BREAST SURGERY Right    biopsy x 3 (all benign)   CARDIAC CATHETERIZATION N/A 06/01/2016   Procedure: LEFT HEART CATH AND CORS/GRAFTS ANGIOGRAPHY;  Surgeon: Wellington Hampshire, MD;  Location: [place] [facility];  Service: Cardiovascular;  Laterality: N/A;   CARDIAC CATHETERIZATION N/A 06/01/2016   Procedure: Coronary Stent Intervention;  Surgeon: Wellington Hampshire, MD;  Location: [place] [facility];  Service: Cardiovascular;  Laterality: N/A;   CARDIOVERSION N/A 03/08/2017   Procedure: CARDIOVERSION;  Surgeon: Wellington Hampshire, MD;  Location: [facility];  Service: Cardiovascular;  Laterality: N/A;   CATARACT EXTRACTION W/PHACO Right 01/04/2016   Procedure: CATARACT EXTRACTION PHACO AND INTRAOCULAR LENS PLACEMENT ([place]);  Surgeon: Leandrew Koyanagi, MD;  Location: [place]  [facility];  Service: Ophthalmology;  Laterality: Right;  MALYUGIN   CATARACT EXTRACTION W/PHACO Left 01/25/2016   Procedure: CATARACT EXTRACTION PHACO AND INTRAOCULAR LENS PLACEMENT ([place]) left eye;  Surgeon: Leandrew Koyanagi, MD;  Location: [place];  Service: Ophthalmology;  Laterality: Left;  MALYUGIN SHUGARCAINE   CHOLECYSTECTOMY  90's   COLONOSCOPY  03/19/2020   Procedure: COLONOSCOPY;  Surgeon: Virgel Manifold, MD;  Location: [facility] ENDOSCOPY;  Service: Gastroenterology;;   COLONOSCOPY WITH PROPOFOL N/A 05/03/2015   Procedure: COLONOSCOPY WITH PROPOFOL;  Surgeon: Lollie Sails, MD;  Location: [facility] ENDOSCOPY;  Service: Endoscopy;  Laterality: N/A;   COLONOSCOPY WITH PROPOFOL N/A 03/19/2020   Procedure: COLONOSCOPY WITH PROPOFOL;  Surgeon: Virgel Manifold, MD;  Location: [facility] ENDOSCOPY;  Service: Endoscopy;  Laterality: N/A;   CORONARY ARTERY BYPASS  GRAFT  98   4 vessel   CORONARY ARTERY BYPASS GRAFT     x 4 age 20 per pt    CORONARY STENT INTERVENTION N/A 03/31/2018   Procedure: CORONARY STENT INTERVENTION;  Surgeon: Wellington Hampshire, MD;  Location: [place] [facility];  Service: Cardiovascular;  Laterality: N/A;   ESOPHAGOGASTRODUODENOSCOPY N/A 03/22/2015   Procedure: ESOPHAGOGASTRODUODENOSCOPY (EGD);  Surgeon: Lollie Sails, MD;  Location: [facility] ENDOSCOPY;  Service: Endoscopy;  Laterality: N/A;   ESOPHAGOGASTRODUODENOSCOPY N/A 03/19/2020   Procedure: ESOPHAGOGASTRODUODENOSCOPY (EGD);  Surgeon: Virgel Manifold, MD;  Location: [facility] ENDOSCOPY;  Service: Endoscopy;  Laterality: N/A;   HEMORRHOID BANDING     HEMORRHOID SURGERY N/A 02/17/2018   Procedure: HEMORRHOIDECTOMY;  Surgeon: Robert Bellow, MD;  Location: [facility];  Service: General;  Laterality: N/A;   JOINT REPLACEMENT     BILATERAL KNEE REPLACEMENTS   KNEE ARTHROSCOPY W/ OATS PROCEDURE     Lt knee (9/01), Rt knee (3/11), Lt hip (5/10)   LEFT HEART CATH AND CORONARY ANGIOGRAPHY N/A 03/31/2018   Procedure: LEFT HEART CATH AND CORONARY ANGIOGRAPHY;  Surgeon: Wellington Hampshire, MD;  Location: [place] [facility];  Service: Cardiovascular;  Laterality: N/A;   TOTAL HIP ARTHROPLASTY     Family History  Problem Relation Age of Onset   Thyroid disease Mother        graves disease   Heart disease Father        rheumatic heart   Alcohol abuse Father    Depression Father    Lung cancer Sister    Depression Daughter    Thyroid disease Daughter        hashimoto   Depression Son    Stroke Maternal Grandmother    Depression Maternal Grandmother    Hypertension Maternal Grandfather    Hypertension Paternal Grandfather    Seizures Son    Breast cancer Neg Hx    Social History   Socioeconomic History   Marital status: Divorced    Spouse name: Not on file   Number of children: 3   Years of education: Not on file   Highest education level: Not on file  Occupational  History   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  Retired Engineer, drilling.  Relatively active.   Uses a Rollator to ambulate   Social Determinants of Health   Financial Resource Strain: Low Risk  (05/29/2021)   Overall Financial Resource Strain (CARDIA)    Difficulty of Paying Living Expenses: Not hard at all  Food Insecurity: No Food Insecurity (05/29/2021)   Hunger Vital Sign    Worried About Running Out of Food in the Last Year: Never true    [place] in the Last Year: Never true  Transportation Needs: No Transportation Needs (05/29/2021)   PRAPARE - Hydrologist (Medical): No    Lack of Transportation (Non-Medical): No  Physical Activity: Sufficiently Active (05/29/2021)   Exercise Vital Sign    Days of Exercise per Week: 5 days    Minutes of Exercise per Session: 30 min  Stress: No Stress Concern Present (05/29/2021)   [place]    Feeling of Stress : Not at all  Social Connections: Unknown (05/29/2021)   Social Connection and Isolation Panel [NHANES]    Frequency of Communication with Friends and Family: More than three times a week    Frequency of Social Gatherings with Friends and Family: Twice a week    Attends Religious Services: Not on Advertising copywriter or Organizations: Not on file    Attends Archivist Meetings: Not on file    Marital Status: Not on file     Review of Systems  Constitutional:  Negative for appetite change and unexpected weight change.  HENT:   Negative for congestion and sinus pressure.   Respiratory:  Negative for cough and chest tightness.        SOB with exertion   Cardiovascular:  Negative for chest pain and palpitations.       No increased swelling.   Gastrointestinal:  Negative for abdominal pain, nausea and vomiting.  Genitourinary:  Negative for difficulty urinating and dysuria.  Musculoskeletal:  Positive for back pain. Negative for myalgias.  Skin:  Negative for color change and rash.  Neurological:  Negative for dizziness, light-headedness and headaches.  Psychiatric/Behavioral:  Negative for agitation and dysphoric mood.        Objective:     BP 108/64 (BP Location: Left Arm, Patient Position: Sitting, Cuff Size: Small)   Pulse 62   Temp 97.7 F (36.5 C) (Temporal)   Resp 19   Ht _0  (1.6 m)   Wt 180 lb (81.6 kg)   SpO2 97%   BMI 31.89 kg/m  Wt Readings from Last 3 Encounters:  05/10/22 180 lb (81.6 kg)  01/08/22 184 lb 9.6 oz (83.7 kg)  11/06/21 183 lb 4 oz (83.1 kg)    Physical Exam Vitals reviewed.  Constitutional:      General: She is not in acute distress.    Appearance: Normal appearance.  HENT:     Head: Normocephalic and atraumatic.     Right Ear: External ear normal.     Left Ear: External ear normal.  Eyes:     General: No scleral icterus.       Right eye: No discharge.        Left eye: No discharge.     Conjunctiva/sclera: Conjunctivae normal.  Neck:     Thyroid: No thyromegaly.  Cardiovascular:     Rate and Rhythm: Normal rate and regular rhythm.  Pulmonary:     Effort:  No respiratory distress.     Breath sounds: Normal breath sounds. No wheezing.  Abdominal:     General: Bowel sounds are normal.     Palpations: Abdomen is soft.     Tenderness: There is no abdominal tenderness.  Musculoskeletal:        General: No tenderness.     Cervical back: Neck supple. No tenderness.     Comments: No increased swelling.   Lymphadenopathy:     Cervical: No cervical adenopathy.   Skin:    Findings: No erythema or rash.     Comments: Two small areas - buttock.  No surrounding erythema.  No open lesions/ulcerations.   Neurological:     Mental Status: She is alert.  Psychiatric:        Mood and Affect: Mood normal.        Behavior: Behavior normal.      Outpatient Encounter Medications as of 05/10/2022  Medication Sig   acetaminophen (TYLENOL) 500 MG tablet Take 1,000 mg by mouth 2 (two) times daily.   amitriptyline (ELAVIL) 25 MG tablet TAKE 1 TABLET BY MOUTH AT BEDTIME   ATROVENT HFA 17 MCG/ACT inhaler INHALE 2 PUFFS INTO THE LUNGS EVERY 6 HOURS AS NEEDED FOR WHEEZING   b complex vitamins tablet Take 1 tablet by mouth daily.   Cholecalciferol (VITAMIN D3) 2000 UNITS TABS Take 2,000 Units by mouth 2 (two) times a week. Tuesday and Saturdays.   clotrimazole (MYCELEX) 10 MG troche Take 10 mg by mouth as needed.   Coenzyme Q10 (COQ-10) 200 MG CAPS Take 200 mg by mouth daily.    conjugated estrogens (PREMARIN) vaginal cream Apply 0.39m (pea-sized amount)  just inside the vaginal introitus with a finger-tip on  Monday, Wednesday and Friday nights. (Patient taking differently: Apply 0.546m(pea-sized amount)  just inside the vaginal introitus with a finger-tip on  Monday, Wednesday and Friday nights.)   CRANBERRY PO Take 1 capsule by mouth in the morning, at noon, and at bedtime.   ELIQUIS 5 MG TABS tablet TAKE 1 TABLET BY MOUTH TWICE DAILY   Evolocumab (REPATHA SURECLICK) 14568G/ML SOAJ Inject 1 pen. into the skin every 14 (fourteen) days.   ezetimibe (ZETIA) 10 MG tablet TAKE 1 TABLET BY MOUTH DAILY   FEROSUL 325 (65 Fe) MG tablet TAKE ONE TABLET EVERY DAY   furosemide (LASIX) 40 MG tablet TAKE 1 TABLET (40MG) IN THE MORNING AND TAKE 1/2 TABLET (20MG) IN THE EVENING   hydrocortisone 2.5 % cream APPLY TO LICHEN PLANUS AS DIRECTED   ketoconazole (NIZORAL) 2 % cream Apply 1 application topically daily.   loratadine (CLARITIN) 10 MG tablet Take 10 mg by mouth daily.    meclizine (ANTIVERT) 25 MG tablet Take 1 tablet (25 mg total) by mouth 3 (three) times daily as needed for dizziness.   Multiple Vitamins-Minerals (HAIR/SKIN/NAILS PO) Take 1 tablet by mouth daily.   mupirocin ointment (BACTROBAN) 2 % Apply to affected area bid   pantoprazole (PROTONIX) 40 MG tablet TAKE 1 TABLET BY MOUTH TWICE DAILY   polyethylene glycol powder (GLYCOLAX/MIRALAX) 17 GM/SCOOP powder Take 8.5 g by mouth daily. Hold if diarrhea   ramipril (ALTACE) 5 MG capsule TAKE 1 CAPSULE BY MOUTH ONCE DAILY   spironolactone (ALDACTONE) 25 MG tablet Take 1 tablet (25 mg total) by mouth every morning.   traMADol (ULTRAM) 50 MG tablet TAKE ONE TABLET TWICE DAILY AS NEEDED   traZODone (DESYREL) 150 MG tablet Two tablets by mouth at bedtime as needed for  sleep   triamcinolone cream (KENALOG) 0.1 % Apply topically 2 (two) times daily.   verapamil (CALAN-SR) 180 MG CR tablet TAKE ONE (1) TABLET BY MOUTH TWO TIMES PER DAY   vitamin B-12 (CYANOCOBALAMIN) 1000 MCG tablet Take 1,000 mcg by mouth daily.   vitamin C (ASCORBIC ACID) 500 MG tablet Take 500 mg by mouth at bedtime.   No facility-administered encounter medications on file as of 05/10/2022.     Lab Results  Component Value Date   WBC 5.5 05/10/2022   HGB 13.0 05/10/2022   HCT 38.7 05/10/2022   PLT 242.0 05/10/2022   GLUCOSE 147 (H) 05/10/2022   CHOL 121 05/10/2022   TRIG 120.0 05/10/2022   HDL 56.80 05/10/2022   LDLDIRECT 126.7 07/03/2013   LDLCALC 41 05/10/2022   ALT 18 05/10/2022   AST 21 05/10/2022   NA 138 05/10/2022   K 4.2 05/10/2022   CL 100 05/10/2022   CREATININE 1.33 (H) 05/10/2022   BUN 31 (H) 05/10/2022   CO2 30 05/10/2022   TSH 1.58 01/11/2022   INR 1.34 03/28/2018   HGBA1C 6.1 05/10/2022    MR Brain Wo Contrast  Result Date: 01/25/2021 CLINICAL DATA:  Dizziness and fall. Bilateral lower extremity weakness. EXAM: MRI HEAD WITHOUT CONTRAST TECHNIQUE: Multiplanar, multiecho pulse sequences of the brain and  surrounding structures were obtained without intravenous contrast. COMPARISON:  03/13/2017 FINDINGS: Brain: No acute infarct, mass effect or extra-axial collection. No acute or chronic hemorrhage. There is multifocal hyperintense T2-weighted signal within the white matter. Generalized volume loss without a clear lobar predilection. A partially empty sella is incidentally noted. Vascular: Major flow voids are preserved. Skull and upper cervical spine: Normal calvarium and skull base. Visualized upper cervical spine and soft tissues are normal. Sinuses/Orbits:No paranasal sinus fluid levels or advanced mucosal thickening. No mastoid or middle ear effusion. Normal orbits. IMPRESSION: 1. No acute intracranial abnormality. 2. Findings of chronic small vessel ischemic disease and generalized volume loss, typical for age. Electronically Signed   By: Ulyses Jarred M.D.   On: 01/25/2021 02:25       Assessment & Plan:   Problem List Items Addressed This Visit     Anemia    Follow cbc.       Relevant Orders   CBC w/Diff (Completed)   Asthma    Breathing stable.       Atrial fibrillation ([place])    Followed by cardiology.  On eliquis.   Stable.  No changes made.        Chronic diastolic heart failure (HCC)    Breathing stable.  On altace, lasix and aldactone.  Appears to be stable.  Follow.        Coronary artery disease of native artery of native heart with stable angina pectoris ([place])    Followed by cardiology.  On eliquis.  Stable.        Depression    Doing well on current regimen.  Continue trazodone.  Denies any adverse effects. Follow.        Essential hypertension, benign - Primary    Blood pressure doing well as outlined.  Continue verapamil, aldactone, lasix and altace.        Relevant Orders   TSH   Basic Metabolic Panel (BMET) (Completed)   GERD (gastroesophageal reflux disease)    No upper symptoms.  On prilosec.       History of colonic polyps    Colonoscopy 03/19/20       Hypercholesterolemia  On repatha.  Follow lipid panel.       Relevant Orders   TSH   Hepatic function panel (Completed)   Lipid Profile (Completed)   Hyperglycemia    Low carb diet and exercise.  Follow met b and a1c.       Relevant Orders   HgB A1c (Completed)   Rectal prolapse    Has seen Dr Leighton Ruff [facility]) - follow.       Skin lesions    Has the two places on her buttock as outlined.  Healing.  Discussed the need to keep the pressure off the area.  Follow closely.  Call with update.       Spinal stenosis    Chronic back and leg pain.  Requires tramadol and tylenol to function.  Walks with rollator.  Stable.        Vaccine counseling    Agreeable to get shingles at the pharmacy.         [name], MD

## 2022-05-11 LAB — CBC WITH DIFFERENTIAL/PLATELET
Basophils Absolute: 0 10*3/uL (ref 0.0–0.1)
Basophils Relative: 0.8 % (ref 0.0–3.0)
Eosinophils Absolute: 0.1 10*3/uL (ref 0.0–0.7)
Eosinophils Relative: 1 % (ref 0.0–5.0)
HCT: 38.7 % (ref 36.0–46.0)
Hemoglobin: 13 g/dL (ref 12.0–15.0)
Lymphocytes Relative: 14.1 % (ref 12.0–46.0)
Lymphs Abs: 0.8 10*3/uL (ref 0.7–4.0)
MCHC: 33.5 g/dL (ref 30.0–36.0)
MCV: 103.3 fl — ABNORMAL HIGH (ref 78.0–100.0)
Monocytes Absolute: 0.7 10*3/uL (ref 0.1–1.0)
Monocytes Relative: 12.1 % — ABNORMAL HIGH (ref 3.0–12.0)
Neutro Abs: 3.9 10*3/uL (ref 1.4–7.7)
Neutrophils Relative %: 72 % (ref 43.0–77.0)
Platelets: 242 10*3/uL (ref 150.0–400.0)
RBC: 3.75 Mil/uL — ABNORMAL LOW (ref 3.87–5.11)
RDW: 12.8 % (ref 11.5–15.5)
WBC: 5.5 10*3/uL (ref 4.0–10.5)

## 2022-05-11 LAB — BASIC METABOLIC PANEL
BUN: 31 mg/dL — ABNORMAL HIGH (ref 6–23)
CO2: 30 mEq/L (ref 19–32)
Calcium: 10.3 mg/dL (ref 8.4–10.5)
Chloride: 100 mEq/L (ref 96–112)
Creatinine, Ser: 1.33 mg/dL — ABNORMAL HIGH (ref 0.40–1.20)
GFR: 36.85 mL/min — ABNORMAL LOW (ref >60.00)
Glucose, Bld: 147 mg/dL — ABNORMAL HIGH (ref 70–99)
Potassium: 4.2 mEq/L (ref 3.5–5.1)
Sodium: 138 mEq/L (ref 135–145)

## 2022-05-11 LAB — LIPID PANEL
Cholesterol: 121 mg/dL (ref 0–200)
HDL: 56.8 mg/dL (ref >39.00)
LDL Cholesterol: 41 mg/dL (ref 0–99)
NonHDL: 64.68
Total CHOL/HDL Ratio: 2
Triglycerides: 120 mg/dL (ref 0.0–149.0)
VLDL: 24 mg/dL (ref 0.0–40.0)

## 2022-05-11 LAB — HEPATIC FUNCTION PANEL
ALT: 18 U/L (ref 0–35)
AST: 21 U/L (ref 0–37)
Albumin: 4.4 g/dL (ref 3.5–5.2)
Alkaline Phosphatase: 50 U/L (ref 39–117)
Bilirubin, Direct: 0.1 mg/dL (ref 0.0–0.3)
Total Bilirubin: 0.5 mg/dL (ref 0.2–1.2)
Total Protein: 6.6 g/dL (ref 6.0–8.3)

## 2022-05-11 LAB — HEMOGLOBIN A1C: Hgb A1c MFr Bld: 6.1 % (ref 4.6–6.5)

## 2022-05-12 ENCOUNTER — Encounter: Payer: Self-pay | Admitting: Internal Medicine

## 2022-05-12 DIAGNOSIS — L989 Disorder of the skin and subcutaneous tissue, unspecified: Secondary | ICD-10-CM | POA: Insufficient documentation

## 2022-05-12 DIAGNOSIS — Z7185 Encounter for immunization safety counseling: Secondary | ICD-10-CM | POA: Insufficient documentation

## 2022-05-12 NOTE — Assessment & Plan Note (Signed)
On repatha.  Follow lipid panel.  

## 2022-05-12 NOTE — Assessment & Plan Note (Signed)
No upper symptoms.  On prilosec.  

## 2022-05-12 NOTE — Assessment & Plan Note (Signed)
Blood pressure doing well as outlined.  Continue verapamil, aldactone, lasix and altace.

## 2022-05-12 NOTE — Assessment & Plan Note (Signed)
Has the two places on her buttock as outlined.  Healing.  Discussed the need to keep the pressure off the area.  Follow closely.  Call with update.

## 2022-05-12 NOTE — Assessment & Plan Note (Signed)
Colonoscopy 03/19/20

## 2022-05-12 NOTE — Assessment & Plan Note (Signed)
Has seen Dr Leighton Ruff [facility]) - follow.

## 2022-05-12 NOTE — Assessment & Plan Note (Signed)
Breathing stable.

## 2022-05-12 NOTE — Assessment & Plan Note (Signed)
Followed by cardiology.  On eliquis.   Stable.  No changes made.   

## 2022-05-12 NOTE — Assessment & Plan Note (Signed)
Low carb diet and exercise.  Follow met b and a1c.

## 2022-05-12 NOTE — Assessment & Plan Note (Signed)
Followed by cardiology.  On eliquis.  Stable.   

## 2022-05-12 NOTE — Assessment & Plan Note (Signed)
Doing well on current regimen.  Continue trazodone.  Denies any adverse effects. Follow.   

## 2022-05-12 NOTE — Assessment & Plan Note (Signed)
Breathing stable.  On altace, lasix and aldactone.  Appears to be stable.  Follow.   

## 2022-05-12 NOTE — Assessment & Plan Note (Signed)
Agreeable to get shingles at the pharmacy.

## 2022-05-12 NOTE — Assessment & Plan Note (Signed)
Follow cbc.  

## 2022-05-12 NOTE — Assessment & Plan Note (Signed)
Chronic back and leg pain.  Requires tramadol and tylenol to function.  Walks with rollator.  Stable.

## 2022-05-17 ENCOUNTER — Other Ambulatory Visit: Payer: Self-pay

## 2022-05-17 DIAGNOSIS — R739 Hyperglycemia, unspecified: Secondary | ICD-10-CM

## 2022-05-17 DIAGNOSIS — I10 Essential (primary) hypertension: Secondary | ICD-10-CM

## 2022-05-17 DIAGNOSIS — R5383 Other fatigue: Secondary | ICD-10-CM

## 2022-05-23 ENCOUNTER — Other Ambulatory Visit: Payer: Self-pay | Admitting: Internal Medicine

## 2022-05-29 ENCOUNTER — Telehealth: Payer: Self-pay | Admitting: Internal Medicine

## 2022-05-29 NOTE — Telephone Encounter (Signed)
Copied from [place] [id number]. Topic: Medicare AWV >> May 29, 2022  1:44 PM Devoria Glassing wrote: Reason for CRM: Left message for patient to schedule Annual Wellness Visit.  Please schedule with Nurse Health Advisor Denisa O'Brien-Blaney, LPN at [facility]. This appt can be telephone or office visit.  Please call [phone] ask for [facility]

## 2022-05-30 ENCOUNTER — Ambulatory Visit (INDEPENDENT_AMBULATORY_CARE_PROVIDER_SITE_OTHER): Payer: Medicare Other

## 2022-05-30 ENCOUNTER — Telehealth: Payer: Self-pay

## 2022-05-30 VITALS — Ht 63.0 in | Wt 180.0 lb

## 2022-05-30 DIAGNOSIS — Z0000 Encounter for general adult medical examination without abnormal findings: Secondary | ICD-10-CM | POA: Diagnosis not present

## 2022-05-30 NOTE — Telephone Encounter (Signed)
Patient requests most recent labs. Nurse mailed per request.

## 2022-05-30 NOTE — Progress Notes (Addendum)
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  Review of Systems    No ROS.  Medicare Wellness Virtual Visit.  Visual/audio telehealth visit, UTA vital signs.   See social history for additional risk factors.   Cardiac Risk Factors include: advanced age (>60mn, >>73women)     Objective:    Today's Vitals   05/30/22 1503  Weight: 180 lb (81.6 kg)  Height: '5\' 3"'$  (1.6 m)   Body mass index is 31.89 kg/m.     05/30/2022    3:09 PM 05/29/2021   12:41 PM 09/01/2020    1:54 PM 05/26/2020   12:52 PM 03/19/2020    8:03 AM 03/17/2020    3:18 AM 03/16/2020    9:39 AM  Advanced Directives  Does Patient Have a Medical Advance Directive? Yes Yes Yes Yes Yes Yes Yes  Type of AP[place]amed[place]of [place] will HHodg[place]of facility DNR (pink MOST or yellow form);Healthcare Power of [place], [place] will  Out of facility DNR (pink MOST or yellow form) Out of facility DNR (pink MOST or yellow form)  Does patient want to make changes to medical advance directive? No - Patient declined No - Patient declined  No - Patient declined  No - Patient declined   Copy of [place] Chart? No - copy requested No - copy requested  No - copy requested     Pre-existing out of facility DNR order (yellow form or pink MOST form)   Yellow form placed in chart (order not valid for inpatient use)        Current Medications (verified) Outpatient Encounter Medications as of 05/30/2022  Medication Sig   acetaminophen (TYLENOL) 500 MG tablet Take 1,000 mg by mouth 2 (two) times daily.   amitriptyline (ELAVIL) 25 MG tablet TAKE 1 TABLET BY MOUTH AT BEDTIME   ATROVENT HFA 17 MCG/ACT inhaler INHALE 2 PUFFS INTO THE LUNGS EVERY 6 HOURS AS NEEDED FOR WHEEZING   b complex vitamins tablet Take 1 tablet by mouth daily.   Cholecalciferol (VITAMIN D3) 2000  UNITS TABS Take 2,000 Units by mouth 2 (two) times a week. Tuesday and Saturdays.   clotrimazole (MYCELEX) 10 MG troche Take 10 mg by mouth as needed.   Coenzyme Q10 (COQ-10) 200 MG CAPS Take 200 mg by mouth daily.    conjugated estrogens (PREMARIN) vaginal cream Apply 0.'5mg'$  (pea-sized amount)  just inside the vaginal introitus with a finger-tip on  Monday, Wednesday and Friday nights. (Patient taking differently: Apply 0.'5mg'$  (pea-sized amount)  just inside the vaginal introitus with a finger-tip on  Monday, Wednesday and Friday nights.)   CRANBERRY PO Take 1 capsule by mouth in the morning, at noon, and at bedtime.   ELIQUIS 5 MG TABS tablet TAKE 1 TABLET BY MOUTH TWICE DAILY   Evolocumab (REPATHA SURECLICK) 1106MG/ML SOAJ Inject 1 pen. into the skin every 14 (fourteen) days.   ezetimibe (ZETIA) 10 MG tablet TAKE 1 TABLET BY MOUTH DAILY   FEROSUL 325 (65 Fe) MG tablet TAKE ONE TABLET EVERY DAY   furosemide (LASIX) 40 MG tablet TAKE 1 TABLET ('40MG'$ ) IN THE MORNING AND TAKE 1/2 TABLET ('20MG'$ ) IN THE EVENING   hydrocortisone 2.5 % cream APPLY TO LICHEN PLANUS AS DIRECTED   ketoconazole (NIZORAL) 2 % cream Apply 1 application topically daily.   loratadine (CLARITIN) 10 MG tablet Take  10 mg by mouth daily.   meclizine (ANTIVERT) 25 MG tablet Take 1 tablet (25 mg total) by mouth 3 (three) times daily as needed for dizziness.   Multiple Vitamins-Minerals (HAIR/SKIN/NAILS PO) Take 1 tablet by mouth daily.   mupirocin ointment (BACTROBAN) 2 % Apply to affected area bid   pantoprazole (PROTONIX) 40 MG tablet TAKE 1 TABLET BY MOUTH TWICE DAILY   polyethylene glycol powder (GLYCOLAX/MIRALAX) 17 GM/SCOOP powder Take 8.5 g by mouth daily. Hold if diarrhea   ramipril (ALTACE) 5 MG capsule TAKE 1 CAPSULE BY MOUTH ONCE DAILY   spironolactone (ALDACTONE) 25 MG tablet Take 1 tablet (25 mg total) by mouth every morning.   traMADol (ULTRAM) 50 MG tablet TAKE ONE TABLET TWICE DAILY AS NEEDED   traZODone (DESYREL) 150  MG tablet Two tablets by mouth at bedtime as needed for sleep   triamcinolone cream (KENALOG) 0.1 % Apply topically 2 (two) times daily.   verapamil (CALAN-SR) 180 MG CR tablet TAKE ONE (1) TABLET BY MOUTH TWO TIMES PER DAY   vitamin B-12 (CYANOCOBALAMIN) 1000 MCG tablet Take 1,000 mcg by mouth daily.   vitamin C (ASCORBIC ACID) 500 MG tablet Take 500 mg by mouth at bedtime.   No facility-administered encounter medications on file as of 05/30/2022.    Allergies (verified) Beta adrenergic blockers; Brilinta [ticagrelor]; Albuterol; Antihistamines, diphenhydramine-type; Aspirin; Nsaids; Penicillins; Statins; Sulfa antibiotics; Sulfasalazine; Tetracycline; and Tetracyclines & related   History: Past Medical History:  Diagnosis Date   Allergy    Arthritis    s/p bilateral knees and left hip replacement   Asthma    CAD (coronary artery disease)    a. 12/1996 s/p CABG x4 [facility] [place]);  b. 2005 Pt reports stress test & cath, which revealed patent grafts.   Cancer [facility])    melanoma right arm   Carotid arterial disease ([place])    a. 04/2015 Carotid U/S: <50% bilat ICA stenosis.   Chronic kidney disease    Colon polyps    H/O   Depression    GERD (gastroesophageal reflux disease)    h/o hiatal hernia   Headache    migraines in past   Heart murmur    a. 04/2011 Echo: EF 55-60%, bilat atrial enlargement, mild to mod TR.   History of chicken pox    History of hiatal hernia    Hx of migraines    rare now   Hx: UTI (urinary tract infection)    Hyperlipidemia    a. Statin intolerant -->on zetia.   Hypertension    Hypertensive heart disease    Lichen planus    Melanoma (HCC)    Palpitations    a. rare PVC's and h/o SVT.   PMR (polymyalgia rheumatica) (HCC)    h/o in setting of crestor usage.   PSVT (paroxysmal supraventricular tachycardia) (HCC)    PUD (peptic ulcer disease)    remote history   Raynaud's phenomenon    Spinal stenosis    Urine incontinence    H/O   Vitamin D  deficiency    Past Surgical History:  Procedure Laterality Date   ADENOIDECTOMY     age 6   BACK SURGERY     L3-L5   BILATERAL CARPAL TUNNEL RELEASE     BREAST BIOPSY Right    bx x 3 neg   BREAST SURGERY Right    biopsy x 3 (all benign)   CARDIAC CATHETERIZATION N/A 06/01/2016   Procedure: LEFT HEART CATH AND CORS/GRAFTS ANGIOGRAPHY;  Surgeon: Rogue Jury  Ferne Reus, MD;  Location: [place] [facility];  Service: Cardiovascular;  Laterality: N/A;   CARDIAC CATHETERIZATION N/A 06/01/2016   Procedure: Coronary Stent Intervention;  Surgeon: Wellington Hampshire, MD;  Location: [place] [facility];  Service: Cardiovascular;  Laterality: N/A;   CARDIOVERSION N/A 03/08/2017   Procedure: CARDIOVERSION;  Surgeon: Wellington Hampshire, MD;  Location: [facility] ORS;  Service: Cardiovascular;  Laterality: N/A;   CATARACT EXTRACTION W/PHACO Right 01/04/2016   Procedure: CATARACT EXTRACTION PHACO AND INTRAOCULAR LENS PLACEMENT ([place]);  Surgeon: Leandrew Koyanagi, MD;  Location: [place];  Service: Ophthalmology;  Laterality: Right;  MALYUGIN   CATARACT EXTRACTION W/PHACO Left 01/25/2016   Procedure: CATARACT EXTRACTION PHACO AND INTRAOCULAR LENS PLACEMENT ([place]) left eye;  Surgeon: Leandrew Koyanagi, MD;  Location: [place];  Service: Ophthalmology;  Laterality: Left;  MALYUGIN SHUGARCAINE   CHOLECYSTECTOMY  90's   COLONOSCOPY  03/19/2020   Procedure: COLONOSCOPY;  Surgeon: Virgel Manifold, MD;  Location: [facility] ENDOSCOPY;  Service: Gastroenterology;;   COLONOSCOPY WITH PROPOFOL N/A 05/03/2015   Procedure: COLONOSCOPY WITH PROPOFOL;  Surgeon: Lollie Sails, MD;  Location: [facility] ENDOSCOPY;  Service: Endoscopy;  Laterality: N/A;   COLONOSCOPY WITH PROPOFOL N/A 03/19/2020   Procedure: COLONOSCOPY WITH PROPOFOL;  Surgeon: Virgel Manifold, MD;  Location: [facility] ENDOSCOPY;  Service: Endoscopy;  Laterality: N/A;   CORONARY ARTERY BYPASS GRAFT  98   4 vessel   CORONARY ARTERY BYPASS GRAFT     x 4  age 75 per pt    CORONARY STENT INTERVENTION N/A 03/31/2018   Procedure: CORONARY STENT INTERVENTION;  Surgeon: Wellington Hampshire, MD;  Location: [place] [facility];  Service: Cardiovascular;  Laterality: N/A;   ESOPHAGOGASTRODUODENOSCOPY N/A 03/22/2015   Procedure: ESOPHAGOGASTRODUODENOSCOPY (EGD);  Surgeon: Lollie Sails, MD;  Location: [facility] ENDOSCOPY;  Service: Endoscopy;  Laterality: N/A;   ESOPHAGOGASTRODUODENOSCOPY N/A 03/19/2020   Procedure: ESOPHAGOGASTRODUODENOSCOPY (EGD);  Surgeon: Virgel Manifold, MD;  Location: [facility] END[facility]COPY;  Service: Endoscopy;  Laterality: N/A;   HEMORRHOID BANDING     HEMORRHOID SURGERY N/A 02/17/2018   Procedure: HEMORRHOIDECTOMY;  Surgeon: Robert Bellow, MD;  Location: [facility];  Service: General;  Laterality: N/A;   JOINT REPLACEMENT     BILATERAL KNEE REPLACEMENTS   KNEE ARTHROSCOPY W/ OATS PROCEDURE     Lt knee (9/01), Rt knee (3/11), Lt hip (5/10)   LEFT HEART CATH AND CORONARY ANGIOGRAPHY N/A 03/31/2018   Procedure: LEFT HEART CATH AND CORONARY ANGIOGRAPHY;  Surgeon: Wellington Hampshire, MD;  Location: [place] [facility];  Service: Cardiovascular;  Laterality: N/A;   TOTAL HIP ARTHROPLASTY     Family History  Problem Relation Age of Onset   Thyroid disease Mother        graves disease   Heart disease Father        rheumatic heart   Alcohol abuse Father    Depression Father    Lung cancer Sister    Depression Daughter    Thyroid disease Daughter        hashimoto   Depression Son    Stroke Maternal Grandmother    Depression Maternal Grandmother    Hypertension Maternal Grandfather    Hypertension Paternal Grandfather    Seizures Son    Breast cancer Neg Hx    Social History   Socioeconomic History   Marital status: Divorced    Spouse name: Not on file   Number of children: 3   Years of education: Not on file  Highest education level: Not on file  Occupational History   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  Retired Engineer, drilling.  Relatively active.   Uses a Rollator to ambulate   Social Determinants of Health   Financial Resource Strain: Low Risk  (05/30/2022)   Overall Financial Resource Strain (CARDIA)    Difficulty of Paying Living Expenses: Not hard at all  Food Insecurity: No Food Insecurity (05/30/2022)   Hunger Vital Sign    Worried About Running Out of Food in the Last Year: Never true    Ran Out of Food in the Last Year: Never true  Transportation Needs: No Transportation Needs (05/30/2022)   PRAPARE - Hydrologist (Medical): No    Lack of Transportation (Non-Medical): No  Physical Activity: Sufficiently Active (05/30/2022)   Exercise Vital Sign    Days of Exercise per Week: 7 days    Minutes of Exercise per Session: 40 min  Stress: No Stress Concern Present (05/30/2022)   [place]     Feeling of Stress : Not at all  Social Connections: Unknown (05/30/2022)   Social Connection and Isolation Panel [NHANES]    Frequency of Communication with Friends and Family: More than three times a week    Frequency of Social Gatherings with Friends and Family: More than three times a week    Attends Religious Services: Not on Advertising copywriter or Organizations: Not on file    Attends Archivist Meetings: Not on file    Marital Status: Not on file    Tobacco Counseling Counseling given: Not Answered Tobacco comments: quit 47+ yrs ago   Clinical Intake:  Pre-visit preparation completed: Yes         Diabetes: No  How often do you need to have someone help you when you read instructions, pamphlets, or other written materials from your doctor or pharmacy?: 1 - Never    Interpreter Needed?: No      Activities of Daily Living    05/30/2022    2:53 PM  In your present state of health, do you have any difficulty performing the following activities:  Hearing? 0  Vision? 0  Difficulty concentrating or making decisions? 0  Walking or climbing stairs? 1  Dressing or bathing? 0  Doing errands, shopping? 1  Preparing Food and eating ? N  Comment Staff assist with meal prep. Self feeds.  Using the Toilet? N  In the past six months, have you accidently leaked urine? N  Do you have problems with loss of bowel control? N  Managing your Medications? Y  Comment Staff assist with pill box.  Managing your Finances? N  Housekeeping or managing your Housekeeping? Y  Comment Maid assist    Patient Care Team: [name], MD as PCP - General (Internal Medicine) Wende Bushy, MD as Consulting Physician (Cardiology) Clent Jacks, RN as Registered Nurse Rockey Situ Kathlene November, MD as Consulting  Physician (Cardiology)  Indicate any recent Medical Services you may have received from other than [facility] providers in the past year (date may be approximate).     Assessment:   This is a routine wellness examination for [place].  Virtual Visit via Telephone Note  I connected with  Charlotte Sessions, MD on 05/30/22 at  2:45 PM EDT by telephone and verified that I am speaking with the correct person using two identifiers.  Location: Patient: home Provider: office Persons participating in the virtual visit: patient/Nurse Health Advisor   I discussed the limitations of performing an evaluation and management service by telehealth. We continued and completed visit with audio only. Some vital signs may be absent or patient reported.   Hearing/Vision screen Hearing Screening - Comments:: Patient  is able to hear conversational tones without difficulty.  No issues reported. Vision Screening - Comments:: Followed by [facility] Wears corrective lenses  Cataract extraction, bilateral  They have regular follow up with the ophthalmologist  Dietary issues and exercise activities discussed: Current Exercise Habits: Home exercise routine, Type of exercise: walking, Time (Minutes): 45, Frequency (Times/Week): 7, Weekly Exercise (Minutes/Week): 315, Intensity: Mild   Goals Addressed               This Visit's Progress     Patient Stated     Monitor sodium intake (pt-stated)   On track      Depression Screen    05/30/2022    3:07 PM 05/10/2022    2:57 PM 05/29/2021   12:40 PM 11/18/2020    4:34 PM 05/26/2020   12:49 PM 05/26/2019    2:59 PM 03/05/2018   10:32 AM  PHQ 2/9 Scores  PHQ - 2 Score 0 0 0 0 0 0 0    Fall Risk    05/30/2022    3:10 PM 05/10/2022    2:57 PM 05/29/2021   12:43 PM 11/18/2020    4:34 PM 05/26/2020   12:42 PM  [place] in the past year? 0 0 0 0 0  Number falls in past yr: 0 0  0 0  Injury with Fall? 0 0 0 0   Risk for fall due to :  No Fall Risks  History of fall(s)   Follow up Falls evaluation completed;Falls prevention discussed Falls evaluation completed Falls evaluation completed Falls evaluation completed Falls evaluation completed   ASSISTIVE DEVICES UTILIZED TO PREVENT FALLS: Life alert? Yes  Use of a cane, walker or w/c? Yes  Grab bars in the bathroom? Yes  Shower chair or bench in shower? Yes  Elevated toilet seat or a handicapped toilet? Yes   TIMED UP AND GO: Was the test performed? No .   Cognitive Function: Patient is alert and oriented x3. Enjoys brain health stimulating activities like puzzles.      03/11/2017    3:38 PM 02/23/2016    3:06 PM  MMSE - Mini Mental State Exam  Orientation to time 5 5  Orientation to Place 5 5  Registration 3 3  Attention/ Calculation 5 5  Recall 3 3  Language- name 2 objects 2 2   Language- repeat 1 1  Language- follow 3 step command 3 3  Language- read & follow direction 1 1  Write a sentence 1 1  Copy design 1 1  Total score 30 30        05/30/2022    3:00 PM 05/29/2021   12:57 PM 05/26/2020   12:47  PM 05/26/2019    3:08 PM 03/18/2018    2:41 PM  6CIT Screen  What Year? 0 points 0 points 0 points 0 points 0 points  What month? 0 points 0 points 0 points 0 points 0 points  What time? 0 points 0 points 0 points 0 points 0 points  Count back from 20  0 points  0 points 0 points  Months in reverse 0 points 0 points  0 points 0 points  Repeat phrase 0 points   0 points 0 points  Total Score    0 points 0 points    Immunizations Immunization History  Administered Date(s) Administered   Fluad Quad(high Dose 65+) 06/22/2021   Influenza, High Dose Seasonal PF 06/14/2016, 07/08/2018, 07/16/2019   Influenza,inj,Quad PF,6+ Mos 06/15/2014   Influenza-Unspecified 07/13/2015   Moderna Covid-19 Vaccine Bivalent Booster 42yr & up 06/30/2021   Moderna Sars-Covid-2 Vaccination 10/22/2019, 11/19/2019, 08/19/2020, 02/24/2021   Pneumococcal Conjugate-13 01/13/2015   Pneumococcal Polysaccharide-23 06/21/2009   Tdap 02/22/2010   Zoster, Live 11/15/2010   Screening Tests Health Maintenance  Topic Date Due   INFLUENZA VACCINE  06/08/2022 (Originally 05/08/2022)   COVID-19 Vaccine (6 - Moderna risk series) 06/15/2022 (Originally 08/25/2021)   Zoster Vaccines- Shingrix (1 of 2) 08/03/2022 (Originally 04/07/1955)   MAMMOGRAM  09/07/2022 (Originally 12/18/2019)   DEXA SCAN  09/07/2022 (Originally 04/08/2003)   TETANUS/TDAP  09/07/2022 (Originally 02/23/2020)   COLONOSCOPY (Pts 45-438yrInsurance coverage will need to be confirmed)  03/19/2025   Pneumonia Vaccine 6562Years old  Completed   HPV VACCINES  Aged Out   Health Maintenance There are no preventive care reminders to display for this patient.  Mammogram- deferred per patient preference Bone Density- deferred per  patient preference  Lung Cancer Screening: (Low Dose CT Chest recommended if Age 84-80ears, 30 pack-year currently smoking OR have quit w/in 15years.) does not qualify.   Hepatitis C Screening: does not qualify  Vision Screening: Recommended annual ophthalmology exams for early detection of glaucoma and other disorders of the eye.  Dental Screening: Recommended annual dental exams for proper oral hygiene. Visits every 4 months.   Community Resource Referral / Chronic Care Management: CRR required this visit?  No   CCM required this visit?  No      Plan:     I have personally reviewed and noted the following in the patient's chart:   Medical and social history Use of alcohol, tobacco or illicit drugs  Current medications and supplements including opioid prescriptions. Patient is currently taking opioid prescriptions. Information provided to patient regarding non-opioid alternatives. Patient advised to discuss non-opioid treatment plan with their provider. Taking Tramadol. Followed by PCP.  Functional ability and status Nutritional status Physical activity Advanced directives List of other physicians Hospitalizations, surgeries, and ER visits in previous 12 months Vitals Screenings to include cognitive, depression, and falls Referrals and appointments  In addition, I have reviewed and discussed with patient certain preventive protocols, quality metrics, and best practice recommendations. A written personalized care plan for preventive services as well as general preventive health recommendations were provided to patient.     OBrien-Blaney, Ramel Tobon L, LPN   05/15/57/8502   I have reviewed the above information and agree with above.   TeDeborra MedinaMD

## 2022-05-30 NOTE — Patient Instructions (Addendum)
Charlotte Wagner , Thank you for taking time to come for your Medicare Wellness Visit. I appreciate your ongoing commitment to your health goals. Please review the following plan we discussed and let me know if I can assist you in the future.   These are the goals we discussed:  Goals       Patient Stated     Monitor sodium intake (pt-stated)        This is a list of the screening recommended for you and due dates:  Health Maintenance  Topic Date Due   Flu Shot  06/08/2022*   COVID-19 Vaccine (6 - Moderna risk series) 06/15/2022*   Zoster (Shingles) Vaccine (1 of 2) 08/03/2022*   Mammogram  09/07/2022*   DEXA scan (bone density measurement)  09/07/2022*   Tetanus Vaccine  09/07/2022*   Colon Cancer Screening  03/19/2025   Pneumonia Vaccine  Completed   HPV Vaccine  Aged Out  *Topic was postponed. The date shown is not the original due date.   Opioid Pain Medicine Management Opioids are powerful medicines that are used to treat moderate to severe pain. When used for short periods of time, they can help you to: Sleep better. Do better in physical or occupational therapy. Feel better in the first few days after an injury. Recover from surgery. Opioids should be taken with the supervision of a trained health care provider. They should be taken for the shortest period of time possible. This is because opioids can be addictive, and the longer you take opioids, the greater your risk of addiction. This addiction can also be called opioid use disorder. What are the risks? Using opioid pain medicines for longer than 3 days increases your risk of side effects. Side effects include: Constipation. Nausea and vomiting. Breathing difficulties (respiratory depression). Drowsiness. Confusion. Opioid use disorder. Itching. Taking opioid pain medicine for a long period of time can affect your ability to do daily tasks. It also puts you at risk for: Motor vehicle  crashes. Depression. Suicide. Heart attack. Overdose, which can be life-threatening. What is a pain treatment plan? A pain treatment plan is an agreement between you and your health care provider. Pain is unique to each person, and treatments vary depending on your condition. To manage your pain, you and your health care provider need to work together. To help you do this: Discuss the goals of your treatment, including how much pain you might expect to have and how you will manage the pain. Review the risks and benefits of taking opioid medicines. Remember that a good treatment plan uses more than one approach and minimizes the chance of side effects. Be honest about the amount of medicines you take and about any drug or alcohol use. Get pain medicine prescriptions from only one health care provider. Pain can be managed with many types of alternative treatments. Ask your health care provider to refer you to one or more specialists who can help you manage pain through: Physical or occupational therapy. Counseling (cognitive behavioral therapy). Good nutrition. Biofeedback. Massage. Meditation. Non-opioid medicine. Following a gentle exercise program. How to use opioid pain medicine Taking medicine Take your pain medicine exactly as told by your health care provider. Take it only when you need it. If your pain gets less severe, you may take less than your prescribed dose if your health care provider approves. If you are not having pain, do nottake pain medicine unless your health care provider tells you to take it. If your pain  is severe, do nottry to treat it yourself by taking more pills than instructed on your prescription. Contact your health care provider for help. Write down the times when you take your pain medicine. It is easy to become confused while on pain medicine. Writing the time can help you avoid overdose. Take other over-the-counter or prescription medicines only as told by  your health care provider. Keeping yourself and others safe  While you are taking opioid pain medicine: Do not drive, use machinery, or power tools. Do not sign legal documents. Do not drink alcohol. Do not take sleeping pills. Do not supervise children by yourself. Do not do activities that require climbing or being in high places. Do not go to a lake, river, ocean, spa, or swimming pool. Do not share your pain medicine with anyone. Keep pain medicine in a locked cabinet or in a secure area where pets and children cannot reach it. Stopping your use of opioids If you have been taking opioid medicine for more than a few weeks, you may need to slowly decrease (taper) how much you take until you stop completely. Tapering your use of opioids can decrease your risk of symptoms of withdrawal, such as: Pain and cramping in the abdomen. Nausea. Sweating. Sleepiness. Restlessness. Uncontrollable shaking (tremors). Cravings for the medicine. Do not attempt to taper your use of opioids on your own. Talk with your health care provider about how to do this. Your health care provider may prescribe a step-down schedule based on how much medicine you are taking and how long you have been taking it. Getting rid of leftover pills Do not save any leftover pills. Get rid of leftover pills safely by: Taking the medicine to a prescription take-back program. This is usually offered by the county or law enforcement. Bringing them to a pharmacy that has a drug disposal container. Flushing them down the toilet. Check the label or package insert of your medicine to see whether this is safe to do. Throwing them out in the trash. Check the label or package insert of your medicine to see whether this is safe to do. If it is safe to throw it out, remove the medicine from the original container, put it into a sealable bag or container, and mix it with used coffee grounds, food scraps, dirt, or cat litter before putting  it in the trash. Follow these instructions at home: Activity Do exercises as told by your health care provider. Avoid activities that make your pain worse. Return to your normal activities as told by your health care provider. Ask your health care provider what activities are safe for you. General instructions You may need to take these actions to prevent or treat constipation: Drink enough fluid to keep your urine pale yellow. Take over-the-counter or prescription medicines. Eat foods that are high in fiber, such as beans, whole grains, and fresh fruits and vegetables. Limit foods that are high in fat and processed sugars, such as fried or sweet foods. Keep all follow-up visits. This is important. Where to find support If you have been taking opioids for a long time, you may benefit from receiving support for quitting from a local support group or counselor. Ask your health care provider for a referral to these resources in your area. Where to find more information Centers for Disease Control and Prevention (CDC): http://www.wolf.info/ U.[place]. Food and Drug Administration (FDA): [place].ch Get help right away if: You may have taken too much of an opioid (overdosed). Common symptoms  of an overdose: Your breathing is slower or more shallow than normal. You have a very slow heartbeat (pulse). You have slurred speech. You have nausea and vomiting. Your pupils become very small. You have other potential symptoms: You are very confused. You faint or feel like you will faint. You have cold, clammy skin. You have blue lips or fingernails. You have thoughts of harming yourself or harming others. These symptoms may represent a serious problem that is an emergency. Do not wait to see if the symptoms will go away. Get medical help right away. Call your local emergency services (911 in the [place] Do not drive yourself to the hospital.  If you ever feel like you may hurt yourself or others, or have thoughts  about taking your own life, get help right away. Go to your nearest emergency department or: Call your local emergency services (911 in the [place] Call the [facility] 989-530-7641 in the [place] Call a suicide crisis helpline, such as the [place] at 604-040-0130 or 988 in the [place]. This is open 24 hours a day in the [place] Text the Crisis Text Line at 973-828-7892 (in the [place] Summary Opioid medicines can help you manage moderate to severe pain for a short period of time. A pain treatment plan is an agreement between you and your health care provider. Discuss the goals of your treatment, including how much pain you might expect to have and how you will manage the pain. If you think that you or someone else may have taken too much of an opioid, get medical help right away. This information is not intended to replace advice given to you by your health care provider. Make sure you discuss any questions you have with your health care provider. Document Revised: 04/19/2021 Document Reviewed: 01/04/2021 Elsevier Patient Education  [place].

## 2022-06-07 DIAGNOSIS — I10 Essential (primary) hypertension: Secondary | ICD-10-CM | POA: Diagnosis not present

## 2022-06-07 DIAGNOSIS — R739 Hyperglycemia, unspecified: Secondary | ICD-10-CM | POA: Diagnosis not present

## 2022-06-11 ENCOUNTER — Other Ambulatory Visit: Payer: Self-pay | Admitting: Cardiovascular Disease

## 2022-06-13 ENCOUNTER — Encounter: Payer: Self-pay | Admitting: Emergency Medicine

## 2022-06-13 ENCOUNTER — Emergency Department
Admission: EM | Admit: 2022-06-13 | Discharge: 2022-06-13 | Disposition: A | Discharge status: Skilled Nursing Facility | Payer: Medicare Other | Attending: Emergency Medicine | Admitting: Emergency Medicine

## 2022-06-13 ENCOUNTER — Emergency Department: Payer: Medicare Other

## 2022-06-13 ENCOUNTER — Other Ambulatory Visit: Payer: Self-pay

## 2022-06-13 DIAGNOSIS — I110 Hypertensive heart disease with heart failure: Secondary | ICD-10-CM | POA: Insufficient documentation

## 2022-06-13 DIAGNOSIS — S79912A Unspecified injury of left hip, initial encounter: Secondary | ICD-10-CM | POA: Diagnosis not present

## 2022-06-13 DIAGNOSIS — M4312 Spondylolisthesis, cervical region: Secondary | ICD-10-CM | POA: Diagnosis not present

## 2022-06-13 DIAGNOSIS — W19XXXA Unspecified fall, initial encounter: Secondary | ICD-10-CM

## 2022-06-13 DIAGNOSIS — I509 Heart failure, unspecified: Secondary | ICD-10-CM | POA: Insufficient documentation

## 2022-06-13 DIAGNOSIS — I4891 Unspecified atrial fibrillation: Secondary | ICD-10-CM | POA: Insufficient documentation

## 2022-06-13 DIAGNOSIS — M545 Low back pain, unspecified: Secondary | ICD-10-CM | POA: Diagnosis not present

## 2022-06-13 DIAGNOSIS — Z7901 Long term (current) use of anticoagulants: Secondary | ICD-10-CM | POA: Diagnosis not present

## 2022-06-13 DIAGNOSIS — R519 Headache, unspecified: Secondary | ICD-10-CM | POA: Diagnosis not present

## 2022-06-13 DIAGNOSIS — M5459 Other low back pain: Secondary | ICD-10-CM | POA: Diagnosis not present

## 2022-06-13 DIAGNOSIS — I2510 Atherosclerotic heart disease of native coronary artery without angina pectoris: Secondary | ICD-10-CM | POA: Diagnosis not present

## 2022-06-13 DIAGNOSIS — M549 Dorsalgia, unspecified: Secondary | ICD-10-CM | POA: Diagnosis not present

## 2022-06-13 DIAGNOSIS — M4316 Spondylolisthesis, lumbar region: Secondary | ICD-10-CM | POA: Diagnosis not present

## 2022-06-13 DIAGNOSIS — S0990XA Unspecified injury of head, initial encounter: Secondary | ICD-10-CM | POA: Diagnosis not present

## 2022-06-13 DIAGNOSIS — Z96642 Presence of left artificial hip joint: Secondary | ICD-10-CM | POA: Diagnosis not present

## 2022-06-13 DIAGNOSIS — Z981 Arthrodesis status: Secondary | ICD-10-CM | POA: Diagnosis not present

## 2022-06-13 MED ORDER — CYCLOBENZAPRINE HCL 10 MG PO TABS
5.0000 mg | ORAL_TABLET | Freq: Once | ORAL | Status: AC
  Administered 2022-06-13: 5 mg via ORAL
  Filled 2022-06-13: qty 1

## 2022-06-13 MED ORDER — TRAMADOL HCL 50 MG PO TABS
50.0000 mg | ORAL_TABLET | Freq: Once | ORAL | Status: AC
  Administered 2022-06-13: 50 mg via ORAL
  Filled 2022-06-13: qty 1

## 2022-06-13 NOTE — ED Provider Notes (Signed)
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  Patient reports that just prior to arrival she was attempting to walk when she lost her balance and fell onto her left hip.  She states there was a nearby wooden board that also fell on top of her, striking her head.  She denies losing consciousness and denies significant headache.  She now complains of pain in the middle of her lower back as well as in her left hip area.  She states she is able to move her left leg but it is painful to do so and difficult for her to walk since the fall.  She denies any neck pain, chest pain, abdominal pain, or upper extremity pain.     Physical Exam   Triage Vital Signs: ED Triage Vitals  Enc Vitals Group     BP 06/13/22 1736 135/87     Pulse Rate 06/13/22 1736 84     Resp 06/13/22 1736 16     Temp 06/13/22 1736 98.5 F (36.9 C)     Temp src --      SpO2 06/13/22 1736 98 %     Weight 06/13/22 1734 178 lb (80.7 kg)     Height 06/13/22 1734 '5\' 2"'$  (1.575 m)     Head Circumference --      Peak Flow --      Pain Score 06/13/22 1734 2     Pain Loc --      Pain Edu? --      Excl. in [place]? --     Most recent vital signs: Vitals:   06/13/22 1736  BP: 135/87  Pulse: 84  Resp: 16  Temp: 98.5 F (36.9 C)  SpO2: 98%    Constitutional: Alert and oriented. Eyes: Conjunctivae are normal. Head: Atraumatic. Nose: No congestion/rhinnorhea. Mouth/Throat: Mucous membranes are moist.  Neck: No midline cervical spine tenderness to palpation. Cardiovascular: Normal rate, regular rhythm. Grossly normal heart sounds.  2+ radial pulses bilaterally. Respiratory: Normal respiratory effort.  No  retractions. Lungs CTAB. Gastrointestinal: Soft and nontender. No distention. Musculoskeletal: No lower extremity tenderness nor edema.  Midline lumbar spinal tenderness to palpation noted.  No upper extremity bony tenderness to palpation noted. Neurologic:  Normal speech and language. No gross focal neurologic deficits are appreciated.    ED Results / Procedures / Treatments   Labs (all labs ordered are listed, but only abnormal results are displayed) Labs Reviewed - No data to display  RADIOLOGY CT head reviewed and interpreted by me with no hemorrhage or midline shift.  X-rays of left hip reviewed and interpreted by me with no fracture or dislocation.  PROCEDURES:  Critical Care performed: No  Procedures   MEDICATIONS ORDERED IN ED: Medications  traMADol (ULTRAM) tablet 50 mg (50 mg Oral Given 06/13/22 2000)     IMPRESSION / MDM / ASSESSMENT AND PLAN / ED COURSE  I reviewed the triage vital signs and the nursing notes.                              87 y.o. female with  past medical history of hypertension, CAD, CHF, atrial fibrillation on Eliquis, and GERD who presents to the ED after losing her balance and falling onto her left hip, with wooden board subsequently striking her head.  Patient's presentation is most consistent with acute complicated illness / injury requiring diagnostic workup.  Differential diagnosis includes, but is not limited to, intracranial injury, cervical spine injury, hip fracture, hip dislocation, lumbar spinal injury.  Patient nontoxic-appearing and in no acute distress, vital signs are unremarkable.  She is neurovascular intact to her bilateral lower extremities, does have some midline lumbar spinal tenderness to palpation.  She is able to range her left hip with minimal discomfort, x-ray shows no evidence of fracture or or dislocation, doubt occult injury of her hip.  Lumbar spine CT is unremarkable, CT head and cervical spine are also reassuring.   No evidence of acute injury related to her fall and patient is appropriate for discharge back to her nursing facility.  We will give her usual dose of tramadol and she was counseled to return to the ED for new worsening symptoms, patient agrees with plan.      FINAL CLINICAL IMPRESSION(S) / ED DIAGNOSES   Final diagnoses:  Fall, initial encounter  Acute midline low back pain without sciatica     Rx / DC Orders   ED Discharge Orders     None        Note:  This document was prepared using Dragon voice recognition software and may include unintentional dictation errors.   Blake Divine, MD 06/13/22 2002

## 2022-06-13 NOTE — ED Triage Notes (Signed)
Pt via EMS from [facility]. States that she loss her balance and landed on her L side of her bottom. States also a piece of wood hit her on the top of the head and she is on Eliquis but it does not hurt. States there L side of her sacrum and L thigh is sore. Pt is A&OX4 and NAD

## 2022-06-13 NOTE — ED Notes (Signed)
First Nurse Note: Pt to ED via [facility] from [facility]. Pt fell and hit her head, pt also having lower back pain. Pt is in NAD.

## 2022-06-13 NOTE — ED Notes (Signed)
In patient room to discharge and pt requesting to have [facility] living and request to have skilled nursing for a few days that she doesn't not feel as thought she will be able to get up and walk without assistance. Spoke with Lovena Le at facility who states they do have the ability to put her I in a skilled room but given the time of night he would be unable to do anything until the morning. He stated he would see what he could do and give the hospital a call back. Also aware that pt will need a ride back to facility that discharge orders have been printed.

## 2022-06-13 NOTE — ED Provider Triage Note (Signed)
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  Pt complains of fall. Has spinal stenosis and lost her balance. Landed on her bottom, left sided. Struck her head. On eliquis. No LOC. Has pain in her "left SI joint." Pain is tolerable when she does not move. Also reports pain in her left thigh.   Review of Systems  Positive: Back pain, left thigh pain Negative: Headache, vomiting  Physical Exam  There were no vitals taken for this visit. Gen:   Awake, no distress   Resp:  Normal effort  MSK:   Moves extremities without difficulty  Other:    Medical Decision Making  Medically screening exam initiated at 5:31 PM.  Appropriate orders placed.  [name], MD was informed that the remainder of the evaluation will be completed by another provider, this initial triage assessment does not replace that evaluation, and the importance of remaining in the ED until their evaluation is complete.     Marquette Old, PA-C 06/13/22 1735

## 2022-06-15 ENCOUNTER — Other Ambulatory Visit: Payer: Self-pay | Admitting: Internal Medicine

## 2022-06-18 ENCOUNTER — Telehealth: Payer: Self-pay | Admitting: Internal Medicine

## 2022-06-18 NOTE — Telephone Encounter (Signed)
Charlotte Wagner from [facility] called stating pt is having frequent urination and not feeling well all over. Charlotte Wagner stated they already did a covid test on pt and it was negative. Pt is requesting a urine analysis and culture done. Charlotte Wagner stated that they can do it there

## 2022-06-19 NOTE — Telephone Encounter (Signed)
Lvm for Collier Salina from [facility] to return call to give [place] a good fax # to fax lab orders for pt.

## 2022-06-19 NOTE — Telephone Encounter (Signed)
Ok to fax order for urinalysis and culture.  If worsening symptoms or acute change, needs to be evaluated.

## 2022-06-20 ENCOUNTER — Observation Stay
Admission: EM | Admit: 2022-06-20 | Discharge: 2022-06-22 | Disposition: A | Discharge status: Home / Self Care | Payer: Medicare Other | Attending: Internal Medicine | Admitting: Internal Medicine

## 2022-06-20 ENCOUNTER — Other Ambulatory Visit: Payer: Self-pay

## 2022-06-20 ENCOUNTER — Emergency Department: Payer: Medicare Other

## 2022-06-20 DIAGNOSIS — Y92009 Unspecified place in unspecified non-institutional (private) residence as the place of occurrence of the external cause: Secondary | ICD-10-CM | POA: Diagnosis not present

## 2022-06-20 DIAGNOSIS — W1830XA Fall on same level, unspecified, initial encounter: Secondary | ICD-10-CM | POA: Diagnosis not present

## 2022-06-20 DIAGNOSIS — Z87891 Personal history of nicotine dependence: Secondary | ICD-10-CM | POA: Insufficient documentation

## 2022-06-20 DIAGNOSIS — Z955 Presence of coronary angioplasty implant and graft: Secondary | ICD-10-CM | POA: Insufficient documentation

## 2022-06-20 DIAGNOSIS — I25118 Atherosclerotic heart disease of native coronary artery with other forms of angina pectoris: Secondary | ICD-10-CM | POA: Diagnosis not present

## 2022-06-20 DIAGNOSIS — I130 Hypertensive heart and chronic kidney disease with heart failure and stage 1 through stage 4 chronic kidney disease, or unspecified chronic kidney disease: Secondary | ICD-10-CM | POA: Diagnosis not present

## 2022-06-20 DIAGNOSIS — R2681 Unsteadiness on feet: Secondary | ICD-10-CM

## 2022-06-20 DIAGNOSIS — Z951 Presence of aortocoronary bypass graft: Secondary | ICD-10-CM | POA: Diagnosis not present

## 2022-06-20 DIAGNOSIS — J45909 Unspecified asthma, uncomplicated: Secondary | ICD-10-CM | POA: Insufficient documentation

## 2022-06-20 DIAGNOSIS — Z85820 Personal history of malignant melanoma of skin: Secondary | ICD-10-CM | POA: Diagnosis not present

## 2022-06-20 DIAGNOSIS — M48061 Spinal stenosis, lumbar region without neurogenic claudication: Secondary | ICD-10-CM | POA: Diagnosis not present

## 2022-06-20 DIAGNOSIS — R531 Weakness: Secondary | ICD-10-CM | POA: Diagnosis not present

## 2022-06-20 DIAGNOSIS — W19XXXA Unspecified fall, initial encounter: Secondary | ICD-10-CM

## 2022-06-20 DIAGNOSIS — Z96653 Presence of artificial knee joint, bilateral: Secondary | ICD-10-CM | POA: Diagnosis not present

## 2022-06-20 DIAGNOSIS — N1831 Chronic kidney disease, stage 3a: Secondary | ICD-10-CM | POA: Diagnosis not present

## 2022-06-20 DIAGNOSIS — Z7901 Long term (current) use of anticoagulants: Secondary | ICD-10-CM | POA: Diagnosis not present

## 2022-06-20 DIAGNOSIS — I480 Paroxysmal atrial fibrillation: Secondary | ICD-10-CM | POA: Insufficient documentation

## 2022-06-20 DIAGNOSIS — Z96649 Presence of unspecified artificial hip joint: Secondary | ICD-10-CM | POA: Diagnosis not present

## 2022-06-20 DIAGNOSIS — J9811 Atelectasis: Secondary | ICD-10-CM | POA: Diagnosis not present

## 2022-06-20 DIAGNOSIS — R5381 Other malaise: Secondary | ICD-10-CM | POA: Diagnosis not present

## 2022-06-20 DIAGNOSIS — I10 Essential (primary) hypertension: Secondary | ICD-10-CM | POA: Diagnosis present

## 2022-06-20 DIAGNOSIS — Z79899 Other long term (current) drug therapy: Secondary | ICD-10-CM | POA: Diagnosis not present

## 2022-06-20 DIAGNOSIS — I2510 Atherosclerotic heart disease of native coronary artery without angina pectoris: Secondary | ICD-10-CM | POA: Diagnosis present

## 2022-06-20 DIAGNOSIS — R262 Difficulty in walking, not elsewhere classified: Secondary | ICD-10-CM

## 2022-06-20 DIAGNOSIS — I4891 Unspecified atrial fibrillation: Secondary | ICD-10-CM | POA: Diagnosis present

## 2022-06-20 DIAGNOSIS — R2689 Other abnormalities of gait and mobility: Secondary | ICD-10-CM | POA: Insufficient documentation

## 2022-06-20 DIAGNOSIS — M4800 Spinal stenosis, site unspecified: Secondary | ICD-10-CM | POA: Diagnosis present

## 2022-06-20 DIAGNOSIS — Z8719 Personal history of other diseases of the digestive system: Secondary | ICD-10-CM

## 2022-06-20 DIAGNOSIS — I5032 Chronic diastolic (congestive) heart failure: Secondary | ICD-10-CM | POA: Diagnosis present

## 2022-06-20 DIAGNOSIS — S0990XA Unspecified injury of head, initial encounter: Secondary | ICD-10-CM | POA: Diagnosis not present

## 2022-06-20 LAB — HEPATIC FUNCTION PANEL
ALT: 21 U/L (ref 0–44)
AST: 27 U/L (ref 15–41)
Albumin: 4.2 g/dL (ref 3.5–5.0)
Alkaline Phosphatase: 54 U/L (ref 38–126)
Bilirubin, Direct: 0.2 mg/dL (ref 0.0–0.2)
Indirect Bilirubin: 0.8 mg/dL (ref 0.3–0.9)
Total Bilirubin: 1 mg/dL (ref 0.3–1.2)
Total Protein: 6.9 g/dL (ref 6.5–8.1)

## 2022-06-20 LAB — LACTIC ACID, PLASMA
Lactic Acid, Venous: 1.4 mmol/L (ref 0.5–1.9)
Lactic Acid, Venous: 1.2 mmol/L (ref 0.5–1.9)

## 2022-06-20 LAB — BASIC METABOLIC PANEL
Anion gap: 11 (ref 5–15)
BUN: 26 mg/dL — ABNORMAL HIGH (ref 8–23)
CO2: 25 mmol/L (ref 22–32)
Calcium: 9.7 mg/dL (ref 8.9–10.3)
Chloride: 101 mmol/L (ref 98–111)
Creatinine, Ser: 1.21 mg/dL — ABNORMAL HIGH (ref 0.44–1.00)
GFR, Estimated: 44 mL/min — ABNORMAL LOW (ref >60)
Glucose, Bld: 108 mg/dL — ABNORMAL HIGH (ref 70–99)
Potassium: 4.3 mmol/L (ref 3.5–5.1)
Sodium: 137 mmol/L (ref 135–145)

## 2022-06-20 LAB — CBC
HCT: 37.8 % (ref 36.0–46.0)
Hemoglobin: 12.2 g/dL (ref 12.0–15.0)
MCH: 33.6 pg (ref 26.0–34.0)
MCHC: 32.3 g/dL (ref 30.0–36.0)
MCV: 104.1 fL — ABNORMAL HIGH (ref 80.0–100.0)
Platelets: 224 10*3/uL (ref 150–400)
RBC: 3.63 MIL/uL — ABNORMAL LOW (ref 3.87–5.11)
RDW: 12.2 % (ref 11.5–15.5)
WBC: 6.5 10*3/uL (ref 4.0–10.5)
nRBC: 0 % (ref 0.0–0.2)

## 2022-06-20 LAB — URINALYSIS, ROUTINE W REFLEX MICROSCOPIC
Bilirubin Urine: NEGATIVE
Glucose, UA: NEGATIVE mg/dL
Hgb urine dipstick: NEGATIVE
Ketones, ur: NEGATIVE mg/dL
Leukocytes,Ua: NEGATIVE
Nitrite: NEGATIVE
Protein, ur: NEGATIVE mg/dL
Specific Gravity, Urine: 1.005 (ref 1.005–1.030)
pH: 7 (ref 5.0–8.0)

## 2022-06-20 LAB — LIPASE, BLOOD: Lipase: 43 U/L (ref 11–51)

## 2022-06-20 LAB — TROPONIN I (HIGH SENSITIVITY)
Troponin I (High Sensitivity): 5 ng/L (ref <18)
Troponin I (High Sensitivity): 6 ng/L (ref <18)

## 2022-06-20 NOTE — ED Notes (Signed)
Patient returned from CT

## 2022-06-20 NOTE — ED Triage Notes (Signed)
First Nurse Note:  Pt via EMS from [facility]. Pt c/o generalized weakness, states she has been getting weaker and weaker. Pt having urinary frequency, pt has a hx of urosepsis. Pt is A&Ox4 and NAD  128/75  77 HR  12-lead unremarkable  96% on RA 98.5 oral  154 CBG

## 2022-06-20 NOTE — ED Notes (Signed)
Patient transported to MRI 

## 2022-06-20 NOTE — ED Triage Notes (Addendum)
Pt to ED via [facility] from home. Pt reports increased generalized weakness, increased confusion and fall last Wednesday. Pt has large bruise to left hip and lower back. Pt states dx with spinal stenosis but states mobility has gotten worse. Pt also with hx urosepsis and states she has had increased urinary frequency. Pt on Eliquis for afib

## 2022-06-20 NOTE — ED Provider Notes (Signed)
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  Denies head strike or loss of consciousness.  She is on Eliquis.  She has been having more more trouble over the last week ambulating due to back pain and bilateral lower extremity weakness.  No Incontinence.  History was obtained via patient and review of external medical notes      Physical Exam   Triage Vital Signs: ED Triage Vitals  Enc Vitals Group     BP 06/20/22 1831 (!) 140/86     Pulse Rate 06/20/22 1829 89     Resp 06/20/22 1829 18     Temp 06/20/22 1829 98 F (36.7 C)     Temp Source 06/20/22 1829 Oral     SpO2 06/20/22 1829 97 %     Weight --      Height --      Head Circumference --      Peak Flow --      Pain Score 06/20/22 1829 3     Pain Loc --      Pain Edu? --      Excl. in [place]? --     Most recent vital signs: Vitals:   06/20/22 2300 06/20/22 2330  BP: (!) 150/78 (!) 157/89  Pulse: 89 85  Resp: 17 (!) 27  Temp:    SpO2: 96% 100%    General: Awake, no distress.  Soft spoken, known vocal cord dysfunction. CV:  Good peripheral perfusion.  Resp:  Normal effort.  Abd:  No distention.  Nontender. Other:  No signs of head trauma.  Mild tenderness in the lumbar spine.  No step-offs or obvious deformities.  She has full sensation to bilateral lower extremities.  She is able to lift both of her legs off the bed with no drift.  Neurovascular intact.   ED Results / Procedures / Treatments   Labs (all labs ordered are listed, but only abnormal results  are displayed) Labs Reviewed  BASIC METABOLIC PANEL - Abnormal; Notable for the following components:      Result Value   Glucose, Bld 108 (*)    BUN 26 (*)    Creatinine, Ser 1.21 (*)    GFR, Estimated 44 (*)    All other components within normal limits  CBC - Abnormal; Notable for the following components:   RBC 3.63 (*)    MCV 104.1 (*)    All other components within normal limits  URINALYSIS, ROUTINE W REFLEX MICROSCOPIC - Abnormal; Notable for the following components:   Color, Urine STRAW (*)    APPearance CLEAR (*)    All other components within normal limits  LACTIC ACID, PLASMA  LACTIC ACID, PLASMA  HEPATIC FUNCTION PANEL  LIPASE, BLOOD  CBG MONITORING, ED  TROPONIN I (HIGH SENSITIVITY)  TROPONIN I (HIGH SENSITIVITY)     I reviewed labs and they are notable for Cr 1.21 at baseline  EKG  ED ECG REPORT I, Lucillie Garfinkel, the attending physician, personally viewed and interpreted  this ECG.   Date: 06/20/2022  EKG Time: 1833  Rate: 86  Rhythm: atrial fibrillation, rate 86  Axis: normal  Intervals:no ishemic changes    RADIOLOGY I independently reviewed and interpreted CT of the head and see no obvious hemorrhage or midline shift   PROCEDURES:  Critical Care performed: No  Procedures   MEDICATIONS ORDERED IN ED: Medications - No data to display  Consultants:  I spoke with hospitalist regarding admission and regarding care plan for this patient.   IMPRESSION / MDM / ASSESSMENT AND PLAN / ED COURSE  I reviewed the triage vital signs and the nursing notes.                              Differential diagnosis includes, but is not limited to, lumbar fracture, spinal stenosis worsening, spinal cord compression, intracranial bleeding, infectious.   The patient is on the cardiac monitor to evaluate for evidence of arrhythmia and/or significant heart rate changes.  MDM: Patient with worsening spinal stenosis on MRI, otherwise work-up is largely unremarkable  for infectious etiologies or cardiac.  Patient is stable in the emergency department though with limited resources at her assisted living facility and worsening lower extremity weakness in the setting of progression of spinal stenosis, admit to hospitalist service with PT evaluation and potential rehab placement.   Patient's presentation is most consistent with acute presentation with potential threat to life or bodily function.       FINAL CLINICAL IMPRESSION(S) / ED DIAGNOSES   Final diagnoses:  Weakness  Fall, initial encounter  Spinal stenosis of lumbar region, unspecified whether neurogenic claudication present     Rx / DC Orders   ED Discharge Orders     None        Note:  This document was prepared using Dragon voice recognition software and may include unintentional dictation errors.    Lucillie Garfinkel, MD 06/20/22 [phone]

## 2022-06-20 NOTE — ED Notes (Signed)
Pt returned from MRI °

## 2022-06-21 ENCOUNTER — Other Ambulatory Visit: Payer: Self-pay

## 2022-06-21 ENCOUNTER — Encounter: Payer: Self-pay | Admitting: Internal Medicine

## 2022-06-21 DIAGNOSIS — Z8719 Personal history of other diseases of the digestive system: Secondary | ICD-10-CM

## 2022-06-21 DIAGNOSIS — I10 Essential (primary) hypertension: Secondary | ICD-10-CM | POA: Diagnosis not present

## 2022-06-21 DIAGNOSIS — M4800 Spinal stenosis, site unspecified: Secondary | ICD-10-CM

## 2022-06-21 DIAGNOSIS — W19XXXA Unspecified fall, initial encounter: Secondary | ICD-10-CM

## 2022-06-21 DIAGNOSIS — R350 Frequency of micturition: Secondary | ICD-10-CM

## 2022-06-21 DIAGNOSIS — Z7901 Long term (current) use of anticoagulants: Secondary | ICD-10-CM

## 2022-06-21 DIAGNOSIS — R531 Weakness: Secondary | ICD-10-CM | POA: Diagnosis not present

## 2022-06-21 DIAGNOSIS — Y92009 Unspecified place in unspecified non-institutional (private) residence as the place of occurrence of the external cause: Secondary | ICD-10-CM

## 2022-06-21 LAB — CBC
HCT: 38.8 % (ref 36.0–46.0)
Hemoglobin: 12.9 g/dL (ref 12.0–15.0)
MCH: 33.6 pg (ref 26.0–34.0)
MCHC: 33.2 g/dL (ref 30.0–36.0)
MCV: 101 fL — ABNORMAL HIGH (ref 80.0–100.0)
Platelets: 233 10*3/uL (ref 150–400)
RBC: 3.84 MIL/uL — ABNORMAL LOW (ref 3.87–5.11)
RDW: 12.2 % (ref 11.5–15.5)
WBC: 6.4 10*3/uL (ref 4.0–10.5)
nRBC: 0 % (ref 0.0–0.2)

## 2022-06-21 MED ORDER — RAMIPRIL 5 MG PO CAPS
5.0000 mg | ORAL_CAPSULE | Freq: Every day | ORAL | Status: DC
  Administered 2022-06-21 – 2022-06-22 (×2): 5 mg via ORAL
  Filled 2022-06-21 (×2): qty 1

## 2022-06-21 MED ORDER — ACETAMINOPHEN 325 MG PO TABS: 650.0000 mg | ORAL_TABLET | Freq: Four times a day (QID) | ORAL | Status: DC | PRN

## 2022-06-21 MED ORDER — SPIRONOLACTONE 25 MG PO TABS
25.0000 mg | ORAL_TABLET | Freq: Every morning | ORAL | Status: DC
  Administered 2022-06-21 – 2022-06-22 (×2): 25 mg via ORAL
  Filled 2022-06-21 (×2): qty 1

## 2022-06-21 MED ORDER — FERROUS SULFATE 325 (65 FE) MG PO TABS
325.0000 mg | ORAL_TABLET | Freq: Every day | ORAL | Status: DC
  Administered 2022-06-21 – 2022-06-22 (×2): 325 mg via ORAL
  Filled 2022-06-21 (×2): qty 1

## 2022-06-21 MED ORDER — OXYCODONE HCL 5 MG PO TABS: 5.0000 mg | ORAL_TABLET | ORAL | Status: DC | PRN

## 2022-06-21 MED ORDER — POLYETHYLENE GLYCOL 3350 17 G PO PACK: 8.5000 g | PACK | Freq: Every day | ORAL | Status: DC

## 2022-06-21 MED ORDER — HYDROCODONE-ACETAMINOPHEN 5-325 MG PO TABS: 1.0000 | ORAL_TABLET | ORAL | Status: DC | PRN

## 2022-06-21 MED ORDER — VERAPAMIL HCL ER 180 MG PO TBCR
180.0000 mg | EXTENDED_RELEASE_TABLET | Freq: Two times a day (BID) | ORAL | Status: DC
  Administered 2022-06-21 – 2022-06-22 (×3): 180 mg via ORAL
  Filled 2022-06-21 (×5): qty 1

## 2022-06-21 MED ORDER — POLYETHYLENE GLYCOL 3350 17 G PO PACK
8.5000 g | PACK | Freq: Every day | ORAL | Status: DC
  Administered 2022-06-21: 8.5 g via ORAL
  Filled 2022-06-21: qty 1

## 2022-06-21 MED ORDER — MECLIZINE HCL 25 MG PO TABS: 25.0000 mg | ORAL_TABLET | Freq: Three times a day (TID) | ORAL | Status: DC | PRN

## 2022-06-21 MED ORDER — ONDANSETRON HCL 4 MG/2ML IJ SOLN: 4.0000 mg | Freq: Four times a day (QID) | INTRAMUSCULAR | Status: DC | PRN

## 2022-06-21 MED ORDER — DEXAMETHASONE 4 MG PO TABS
4.0000 mg | ORAL_TABLET | Freq: Every day | ORAL | Status: DC
  Administered 2022-06-21 – 2022-06-22 (×2): 4 mg via ORAL
  Filled 2022-06-21 (×2): qty 1

## 2022-06-21 MED ORDER — HYDROCORTISONE 1 % EX CREA
TOPICAL_CREAM | CUTANEOUS | Status: DC | PRN
  Filled 2022-06-21: qty 28

## 2022-06-21 MED ORDER — SENNOSIDES-DOCUSATE SODIUM 8.6-50 MG PO TABS: 1.0000 | ORAL_TABLET | Freq: Every evening | ORAL | Status: DC | PRN

## 2022-06-21 MED ORDER — ACETAMINOPHEN 650 MG RE SUPP: 650.0000 mg | Freq: Four times a day (QID) | RECTAL | Status: DC | PRN

## 2022-06-21 MED ORDER — ONDANSETRON HCL 4 MG PO TABS: 4.0000 mg | ORAL_TABLET | Freq: Four times a day (QID) | ORAL | Status: DC | PRN

## 2022-06-21 MED ORDER — TRAZODONE HCL 50 MG PO TABS
150.0000 mg | ORAL_TABLET | Freq: Every day | ORAL | Status: DC
  Administered 2022-06-21: 150 mg via ORAL
  Filled 2022-06-21: qty 3

## 2022-06-21 MED ORDER — EZETIMIBE 10 MG PO TABS
10.0000 mg | ORAL_TABLET | Freq: Every day | ORAL | Status: DC
  Administered 2022-06-21 – 2022-06-22 (×2): 10 mg via ORAL
  Filled 2022-06-21 (×2): qty 1

## 2022-06-21 MED ORDER — MORPHINE SULFATE (PF) 2 MG/ML IV SOLN: 2.0000 mg | INTRAVENOUS | Status: DC | PRN

## 2022-06-21 MED ORDER — HYDRALAZINE HCL 20 MG/ML IJ SOLN: 10.0000 mg | INTRAMUSCULAR | Status: DC | PRN

## 2022-06-21 MED ORDER — APIXABAN 5 MG PO TABS
5.0000 mg | ORAL_TABLET | Freq: Two times a day (BID) | ORAL | Status: DC
  Administered 2022-06-21 – 2022-06-22 (×3): 5 mg via ORAL
  Filled 2022-06-21 (×3): qty 1

## 2022-06-21 MED ORDER — ACETAMINOPHEN 500 MG PO TABS
1000.0000 mg | ORAL_TABLET | Freq: Two times a day (BID) | ORAL | Status: DC
  Administered 2022-06-21 – 2022-06-22 (×3): 1000 mg via ORAL
  Filled 2022-06-21 (×3): qty 2

## 2022-06-21 MED ORDER — CYCLOBENZAPRINE HCL 10 MG PO TABS
5.0000 mg | ORAL_TABLET | Freq: Three times a day (TID) | ORAL | Status: DC | PRN
  Administered 2022-06-22: 5 mg via ORAL
  Filled 2022-06-21: qty 1

## 2022-06-21 MED ORDER — HYDROCORTISONE (PERIANAL) 2.5 % EX CREA
TOPICAL_CREAM | CUTANEOUS | Status: DC | PRN
  Filled 2022-06-21: qty 28.35

## 2022-06-21 MED ORDER — TRAMADOL HCL 50 MG PO TABS
50.0000 mg | ORAL_TABLET | Freq: Three times a day (TID) | ORAL | Status: DC | PRN
  Administered 2022-06-22: 50 mg via ORAL
  Filled 2022-06-21: qty 1

## 2022-06-21 NOTE — Assessment & Plan Note (Signed)
Chronic anticoagulation Rate controlled Continue verapamil and continue Eliquis Hemoglobin stable.  Monitor periodically as patient has history of GI bleeding

## 2022-06-21 NOTE — Telephone Encounter (Signed)
LVM for Collier Salina of [facility] to call back and let me know where they are sending the urine order and a fax number.  Traquan Duarte,cma

## 2022-06-21 NOTE — Evaluation (Signed)
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 Imaging:  CT head negative, MRI of lumbar spine, spinal stenosis   Clinical Impression   Pt was seen for OT evaluation this date. Prior to hospital admission, pt was living at [facility]. She uses a rollator at baseline and is generally modified independent with ADL. Pt presents to acute OT demonstrating impaired ADL performance and functional mobility 2/2 decreased strength, activity tolerance, balance, in part resulting from severe stenosis and a flexed trunk (See OT problem list). Pt eager to use the [facility] upon OT's arrival. Pt currently requires CGA for pericare and clothing mgt after toileting in standing at [facility] with RW and SBA for ADL transfers with RW. Pt would benefit from skilled OT services to address noted impairments and functional limitations (see below for any additional details) in order to maximize safety and independence while minimizing falls risk and caregiver burden. Upon hospital discharge, recommend HHOT to maximize pt safety and return to functional independence during meaningful occupations of daily life. Care team notified of pt's wish to discharge tonight.      Recommendations for follow up therapy are one component of a multi-disciplinary discharge planning process, led by the attending physician.  Recommendations may be updated based on  patient status, additional functional criteria and insurance authorization.   Follow Up Recommendations  Home health OT    Assistance Recommended at Discharge Frequent or constant Supervision/Assistance  Patient can return home with the following A little help with walking and/or transfers;A little help with bathing/dressing/bathroom;Assistance with cooking/housework;Help with stairs or ramp for entrance;Assist for transportation    Functional Status Assessment  Patient has had a recent decline in their functional status and demonstrates the ability to make significant improvements in function in a reasonable and predictable amount of time.  Equipment Recommendations  None recommended by OT    Recommendations for Other Services       Precautions / Restrictions Precautions Precautions: Fall Restrictions Weight Bearing Restrictions: No      Mobility Bed Mobility               General bed mobility comments: NT patient received up in recliner    Transfers Overall transfer level: Needs assistance Equipment used: Rolling walker (2 wheels) Transfers: Sit to/from Stand Sit to Stand: Supervision           General transfer comment: SBA, unsteady but likely near baseline      Balance Overall balance assessment: Needs assistance, History of Falls Sitting-balance support: Feet supported Sitting balance-Leahy Scale: Good     Standing balance support: Bilateral upper extremity supported, During functional activity, Reliant on assistive device for balance, Single extremity supported Standing balance-Leahy Scale: Fair                             ADL either performed or assessed with clinical judgement   ADL  General ADL Comments: Pt required SBA for BSC transfers, CGA in standing during pericare and clothing mgt     Vision         Perception     Praxis      Pertinent Vitals/Pain Pain  Assessment Pain Assessment: No/denies pain     Hand Dominance Right   Extremity/Trunk Assessment Upper Extremity Assessment Upper Extremity Assessment: Generalized weakness   Lower Extremity Assessment Lower Extremity Assessment: Generalized weakness   Cervical / Trunk Assessment Cervical / Trunk Assessment: Kyphotic (severe stenosis, flexed trunk)   Communication Communication Communication: Expressive difficulties (voice is hoarse and sometimes difficult to hear/understand)   Cognition Arousal/Alertness: Awake/alert Behavior During Therapy: WFL for tasks assessed/performed, Impulsive Overall Cognitive Status: Within Functional Limits for tasks assessed                                 General Comments: mildly impulsive     General Comments       Exercises     Shoulder Instructions      Home Living Family/patient expects to be discharged to:: Assisted living                             Home Equipment: Rollator (4 wheels)          Prior Functioning/Environment Prior Level of Function : Independent/Modified Independent;History of Falls (last six months)             Mobility Comments: leans over onto rollator at baseline due to stenosis. ADLs Comments: mod I        OT Problem List: Decreased strength;Decreased range of motion;Decreased activity tolerance;Impaired balance (sitting and/or standing);Decreased knowledge of use of DME or AE;Decreased safety awareness      OT Treatment/Interventions: Self-care/ADL training;Therapeutic exercise;Therapeutic activities;Energy conservation;DME and/or AE instruction;Patient/family education;Balance training    OT Goals(Current goals can be found in the care plan section) Acute Rehab OT Goals Patient Stated Goal: go home tonight OT Goal Formulation: With patient Time For Goal Achievement: 07/05/22 Potential to Achieve Goals: Good ADL Goals Pt Will Perform Lower Body Dressing: with  modified independence;sit to/from stand Pt Will Transfer to Toilet: with modified independence;ambulating (LRAD) Pt Will Perform Toileting - Clothing Manipulation and hygiene: with modified independence Additional ADL Goal #1: Pt will verbalize plan to implement at least 1 learned falls prevention strategy to maximize safety.  OT Frequency: Min 2X/week    Co-evaluation              AM-PAC OT "6 Clicks" Daily Activity     Outcome Measure Help from another person eating meals?: None Help from another person taking care of personal grooming?: A Little Help from another person toileting, which includes using toliet, bedpan, or urinal?: A Little Help from another person bathing (including washing, rinsing, drying)?: A Little Help from another person to put on and taking off regular upper body clothing?: None Help from another person to put on and taking off regular lower body clothing?: A Little 6 Click Score: 20   End of Session Equipment Utilized During Treatment: Rolling walker (2 wheels) Nurse Communication: Mobility status  Activity Tolerance: Patient tolerated treatment well Patient left: in chair;with call bell/phone within reach  OT Visit Diagnosis: Unsteadiness on feet (R26.81);Muscle weakness (generalized) (M62.81);History of falling (Z91.81)                Time: 6659-9357  OT Time Calculation (min): 16 min Charges:  OT General Charges $OT Visit: 1 Visit OT Evaluation $OT Eval Moderate Complexity: 1 Mod  Ardeth Perfect., MPH, MS, OTR/L ascom [phone] 06/21/22, 4:57 PM

## 2022-06-21 NOTE — Assessment & Plan Note (Signed)
Compensated Continue furosemide and spironolactone as well as ramipril

## 2022-06-21 NOTE — H&P (Signed)
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  She denies any other symptoms suggestive of infection such as fever or chills, dysuria, cough or chest pain and denies headache, visual disturbance or laterality to her symptoms.  Denies abdominal pain, nausea, vomiting or diarrhea or constipation ED course and data review: BP 140/86 with otherwise normal vitals.  Labs unremarkable and creatinine at 1.21 is at baseline.  CBC, BMP, hepatic function panel, troponin and lipase and lactic acid all unremarkable.  Urinalysis unremarkable.  EKG, personally viewed and interpreted showing atrial fibrillation at 86 with nonspecific ST-T wave changes. Imaging significant for lumbar spinal MRI showing progression of severe spinal canal stenosis at L1-L2, otherwise unchanged.  Chest x-ray stable and CT head nonacute Hospitalist consulted for admission for PT evaluation and possible SNF placement.    Review of Systems: As mentioned in the history of present illness. All other systems reviewed and are negative.  Past Medical History:  Diagnosis Date   Allergy    Arthritis    s/p bilateral knees and left hip replacement   Asthma    CAD (coronary artery disease)    a. 12/1996 s/p CABG x4 [facility]  [place]);  b. 2005 Pt reports stress test & cath, which revealed patent grafts.   Cancer [facility])    melanoma right arm   Carotid arterial disease ([place])    a. 04/2015 Carotid U/S: <50% bilat ICA stenosis.   Chronic kidney disease    Colon polyps    H/O   Depression    GERD (gastroesophageal reflux disease)    h/o hiatal hernia   Headache    migraines in past   Heart murmur    a. 04/2011 Echo: EF 55-60%, bilat atrial enlargement, mild to mod TR.   History of chicken pox    History of hiatal hernia    Hx of migraines    rare now   Hx: UTI (urinary tract infection)    Hyperlipidemia    a. Statin intolerant -->on zetia.   Hypertension    Hypertensive heart disease    Lichen planus    Melanoma (HCC)    Palpitations    a. rare PVC's and h/o SVT.   PMR (polymyalgia rheumatica) (HCC)    h/o in setting of crestor usage.   PSVT (paroxysmal supraventricular tachycardia) (HCC)    PUD (peptic ulcer disease)    remote history   Raynaud's phenomenon    Spinal stenosis    Urine incontinence    H/O   Vitamin D deficiency    Past Surgical History:  Procedure Laterality Date   ADENOIDECTOMY     age 69   BACK SURGERY     L3-L5   BILATERAL CARPAL TUNNEL RELEASE     BREAST BIOPSY Right    bx x  3 neg   BREAST SURGERY Right    biopsy x 3 (all benign)   CARDIAC CATHETERIZATION N/A 06/01/2016   Procedure: LEFT HEART CATH AND CORS/GRAFTS ANGIOGRAPHY;  Surgeon: Wellington Hampshire, MD;  Location: [place] [facility];  Service: Cardiovascular;  Laterality: N/A;   CARDIAC CATHETERIZATION N/A 06/01/2016   Procedure: Coronary Stent Intervention;  Surgeon: Wellington Hampshire, MD;  Location: [place] [facility];  Service: Cardiovascular;  Laterality: N/A;   CARDIOVERSION N/A 03/08/2017   Procedure: CARDIOVERSION;  Surgeon: Wellington Hampshire, MD;  Location: [facility];  Service: Cardiovascular;  Laterality: N/A;   CATARACT EXTRACTION W/PHACO Right 01/04/2016   Procedure: CATARACT EXTRACTION PHACO AND INTRAOCULAR  LENS PLACEMENT ([place]);  Surgeon: Leandrew Koyanagi, MD;  Location: [place];  Service: Ophthalmology;  Laterality: Right;  MALYUGIN   CATARACT EXTRACTION W/PHACO Left 01/25/2016   Procedure: CATARACT EXTRACTION PHACO AND INTRAOCULAR LENS PLACEMENT ([place]) left eye;  Surgeon: Leandrew Koyanagi, MD;  Location: [place];  Service: Ophthalmology;  Laterality: Left;  MALYUGIN SHUGARCAINE   CHOLECYSTECTOMY  90's   COLONOSCOPY  03/19/2020   Procedure: COLONOSCOPY;  Surgeon: Virgel Manifold, MD;  Location: [facility] ENDOSCOPY;  Service: Gastroenterology;;   COLONOSCOPY WITH PROPOFOL N/A 05/03/2015   Procedure: COLONOSCOPY WITH PROPOFOL;  Surgeon: Lollie Sails, MD;  Location: [facility];  Service: Endoscopy;  Laterality: N/A;   COLONOSCOPY WITH PROPOFOL N/A 03/19/2020   Procedure: COLONOSCOPY WITH PROPOFOL;  Surgeon: Virgel Manifold, MD;  Location: [facility]OS[facility];  Service: Endoscopy;  Laterality: N/A;   CORONARY ARTERY BYPASS GRAFT  98   4 vessel   CORONARY ARTERY BYPASS GRAFT     x 4 age 41 per pt    CORONARY STENT INTERVENTION N/A 03/31/2018   Procedure: CORONARY STENT INTERVENTION;  Surgeon: Wellington Hampshire, MD;  Location: [place] [facility];  Service: Cardiovascular;  Laterality: N/A;   ESOPHAGOGASTRODUODENOSCOPY N/A 03/22/2015   Procedure: ESOPHAGOGASTRODUODENOSCOPY (EGD);  Surgeon: Lollie Sails, MD;  Location: [facility] ENDOSCOPY;  Service: Endoscopy;  Laterality: N/A;   ESOPHAGOGASTRODUODENOSCOPY N/A 03/19/2020   Procedure: ESOPHAGOGASTRODUODENOSCOPY (EGD);  Surgeon: Virgel Manifold, MD;  Location: [facility] ENDOSCOPY;  Service: Endoscopy;  Laterality: N/A;   HEMORRHOID BANDING     HEMORRHOID SURGERY N/A 02/17/2018   Procedure: HEMORRHOIDECTOMY;  Surgeon: Robert Bellow, MD;  Location: [facility];  Service: General;  Laterality: N/A;   JOINT REPLACEMENT     BILATERAL KNEE REPLACEMENTS   KNEE ARTHROSCOPY W/ OATS PROCEDURE     Lt knee (9/01), Rt knee (3/11),  Lt hip (5/10)   LEFT HEART CATH AND CORONARY ANGIOGRAPHY N/A 03/31/2018   Procedure: LEFT HEART CATH AND CORONARY ANGIOGRAPHY;  Surgeon: Wellington Hampshire, MD;  Location: [place] [facility];  Service: Cardiovascular;  Laterality: N/A;   TOTAL HIP ARTHROPLASTY     Social History:  reports that she has quit smoking. Her smoking use included cigarettes. She has never used smokeless tobacco. She reports that she does not drink alcohol and does not use drugs.  Allergies  Allergen Reactions   Beta Adrenergic Blockers Shortness Of Breath    DEPRESSION/HYPOTENSION   Brilinta [Ticagrelor] Shortness Of Breath    Caused AFIB   Albuterol     rigors   Antihistamines, Diphenhydramine-Type     Muscle spasms   Aspirin Other (See Comments)    Heartburn    Nsaids Other (See Comments)    ULCER HISTORY & CURRENTLY ON BLOOD THINNERS   Penicillins Hives and Other (See  Comments)    Has patient had a PCN reaction causing immediate rash, facial/tongue/throat swelling, SOB or lightheadedness with hypotension: No Has patient had a PCN reaction causing severe rash involving mucus membranes or skin necrosis: No Has patient had a PCN reaction that required hospitalization No Has patient had a PCN reaction occurring within the last 10 years: No If all of the above answers are "NO", then may proceed with Cephalosporin use.    Statins Other (See Comments)    Muscle weakness   Sulfa Antibiotics Rash    At mouth   Sulfasalazine Rash    At mouth   Tetracycline Rash   Tetracyclines & Related Rash    Family History  Problem Relation Age of Onset   Thyroid disease Mother        graves disease   Heart disease Father        rheumatic heart   Alcohol abuse Father    Depression Father    Lung cancer Sister    Depression Daughter    Thyroid disease Daughter        hashimoto   Depression Son    Stroke Maternal Grandmother    Depression Maternal Grandmother    Hypertension Maternal Grandfather     Hypertension Paternal Grandfather    Seizures Son    Breast cancer Neg Hx     Prior to Admission medications   Medication Sig Start Date End Date Taking? Authorizing Provider  acetaminophen (TYLENOL) 500 MG tablet Take 1,000 mg by mouth 2 (two) times daily.    [provider]  amitriptyline (ELAVIL) 25 MG tablet TAKE 1 TABLET BY MOUTH AT BEDTIME 05/23/22   [name], MD  ATROVENT HFA 17 MCG/ACT inhaler INHALE 2 PUFFS INTO THE LUNGS EVERY 6 HOURS AS NEEDED FOR WHEEZING 10/11/21   [name], MD  b complex vitamins tablet Take 1 tablet by mouth daily.    [provider]  Cholecalciferol (VITAMIN D3) 2000 UNITS TABS Take 2,000 Units by mouth 2 (two) times a week. Tuesday and Saturdays.    [provider]  clotrimazole (MYCELEX) 10 MG troche Take 10 mg by mouth as needed.    [provider]  Coenzyme Q10 (COQ-10) 200 MG CAPS Take 200 mg by mouth daily.     [provider]  conjugated estrogens (PREMARIN) vaginal cream Apply 0.'5mg'$  (pea-sized amount)  just inside the vaginal introitus with a finger-tip on  Monday, Wednesday and Friday nights. Patient taking differently: Apply 0.'5mg'$  (pea-sized amount)  just inside the vaginal introitus with a finger-tip on  Monday, Wednesday and Friday nights. 06/24/18   McGowan, Larene Beach A, PA-C  CRANBERRY PO Take 1 capsule by mouth in the morning, at noon, and at bedtime.    [provider]  ELIQUIS 5 MG TABS tablet TAKE 1 TABLET BY MOUTH TWICE DAILY 02/28/22   Minna Merritts, MD  ezetimibe (ZETIA) 10 MG tablet TAKE 1 TABLET BY MOUTH DAILY 04/25/22   Minna Merritts, MD  FEROSUL 325 (65 Fe) MG tablet TAKE ONE TABLET EVERY DAY 03/01/22   [name], MD  furosemide (LASIX) 40 MG tablet TAKE 1 TABLET ('40MG'$ ) IN THE MORNING AND TAKE 1/2 TABLET ('20MG'$ ) IN THE EVENING 04/04/22   Minna Merritts, MD  hydrocortisone 2.5 % cream APPLY TO LICHEN PLANUS AS DIRECTED 06/15/22   [name], MD  ketoconazole  (NIZORAL) 2 % cream Apply 1 application topically daily. 08/22/21   [name], MD  loratadine (CLARITIN) 10 MG tablet  Take 10 mg by mouth daily.    [provider]  meclizine (ANTIVERT) 25 MG tablet Take 1 tablet (25 mg total) by mouth 3 (three) times daily as needed for dizziness. 01/10/21   McLean-Scocuzza, Nino Glow, MD  Multiple Vitamins-Minerals (HAIR/SKIN/NAILS PO) Take 1 tablet by mouth daily.    [provider]  mupirocin ointment (BACTROBAN) 2 % Apply to affected area bid 11/11/18   [name], MD  pantoprazole (PROTONIX) 40 MG tablet TAKE 1 TABLET BY MOUTH TWICE DAILY 05/23/22   [name], MD  polyethylene glycol powder (GLYCOLAX/MIRALAX) 17 GM/SCOOP powder Take 8.5 g by mouth daily. Hold if diarrhea 03/19/20   Florencia Reasons, MD  ramipril (ALTACE) 5 MG capsule TAKE 1 CAPSULE BY MOUTH ONCE DAILY 03/28/22   Theora Gianotti, NP  REPATHA SURECLICK 703 MG/ML SOAJ INJECT 1 PEN EVERY 14 DAYS AS DIRECTED 06/12/22   Minna Merritts, MD  spironolactone (ALDACTONE) 25 MG tablet Take 1 tablet (25 mg total) by mouth every morning. 04/03/22   [name], MD  traMADol (ULTRAM) 50 MG tablet TAKE ONE TABLET TWICE DAILY AS NEEDED 03/09/22   [name], MD  traZODone (DESYREL) 150 MG tablet Two tablets by mouth at bedtime as needed for sleep 03/21/22   [name], MD  triamcinolone cream (KENALOG) 0.1 % Apply topically 2 (two) times daily. 06/28/20   [provider]  verapamil (CALAN-SR) 180 MG CR tablet TAKE ONE (1) TABLET BY MOUTH TWO TIMES PER DAY 04/25/22   Minna Merritts, MD  vitamin B-12 (CYANOCOBALAMIN) 1000 MCG tablet Take 1,000 mcg by mouth daily.    [provider]  vitamin C (ASCORBIC ACID) 500 MG tablet Take 500 mg by mouth at bedtime.    [provider]    Physical Exam: Vitals:   06/20/22 2300 06/20/22 2330 06/21/22 0000 06/21/22 0030  BP: (!) 150/78 (!) 157/89 139/81 (!) 150/73  Pulse: 89 85 83   Resp: 17 (!) '27 18 17   '$ Temp:      TempSrc:      SpO2: 96% 100% 97%    Physical Exam Vitals and nursing note reviewed.  Constitutional:      General: She is not in acute distress. HENT:     Head: Normocephalic and atraumatic.  Cardiovascular:     Rate and Rhythm: Normal rate and regular rhythm.     Heart sounds: Normal heart sounds.  Pulmonary:     Effort: Pulmonary effort is normal.     Breath sounds: Normal breath sounds.  Abdominal:     Palpations: Abdomen is soft.     Tenderness: There is no abdominal tenderness.  Neurological:     Mental Status: Mental status is at baseline.     Labs on Admission: I have personally reviewed following labs and imaging studies  CBC: Recent Labs  Lab 06/20/22 1831  WBC 6.5  HGB 12.2  HCT 37.8  MCV 104.1*  PLT 500   Basic Metabolic Panel: Recent Labs  Lab 06/20/22 1831  NA 137  K 4.3  CL 101  CO2 25  GLUCOSE 108*  BUN 26*  CREATININE 1.21*  CALCIUM 9.7   GFR: Estimated Creatinine Clearance: 34 mL/min (A) (by C-G formula based on SCr of 1.21 mg/dL (H)). Liver Function Tests: Recent Labs  Lab 06/20/22 2136  AST 27  ALT 21  ALKPHOS 54  BILITOT 1.0  PROT 6.9  ALBUMIN 4.2   Recent Labs  Lab 06/20/22 2136  LIPASE 43  No results for input(s): "AMMONIA" in the last 168 hours. Coagulation Profile: No results for input(s): "INR", "PROTIME" in the last 168 hours. Cardiac Enzymes: No results for input(s): "CKTOTAL", "CKMB", "CKMBINDEX", "TROPONINI" in the last 168 hours. BNP (last 3 results) No results for input(s): "PROBNP" in the last 8760 hours. HbA1C: No results for input(s): "HGBA1C" in the last 72 hours. CBG: No results for input(s): "GLUCAP" in the last 168 hours. Lipid Profile: No results for input(s): "CHOL", "HDL", "LDLCALC", "TRIG", "CHOLHDL", "LDLDIRECT" in the last 72 hours. Thyroid Function Tests: No results for input(s): "TSH", "T4TOTAL", "FREET4", "T3FREE", "THYROIDAB" in the last 72 hours. Anemia Panel: No results  for input(s): "VITAMINB12", "FOLATE", "FERRITIN", "TIBC", "IRON", "RETICCTPCT" in the last 72 hours. Urine analysis:    Component Value Date/Time   COLORURINE STRAW (A) 06/20/2022 1831   APPEARANCEUR CLEAR (A) 06/20/2022 1831   APPEARANCEUR Clear 06/24/2018 1205   LABSPEC 1.005 06/20/2022 1831   PHURINE 7.0 06/20/2022 1831   GLUCOSEU NEGATIVE 06/20/2022 1831   GLUCOSEU NEGATIVE 03/18/2018 1542   HGBUR NEGATIVE 06/20/2022 1831   BILIRUBINUR NEGATIVE 06/20/2022 1831   BILIRUBINUR Negative 06/24/2018 1205   KETONESUR NEGATIVE 06/20/2022 1831   PROTEINUR NEGATIVE 06/20/2022 1831   UROBILINOGEN 1.0 03/18/2018 1542   NITRITE NEGATIVE 06/20/2022 1831   LEUKOCYTESUR NEGATIVE 06/20/2022 1831    Radiological Exams on Admission: MR LUMBAR SPINE WO CONTRAST  Result Date: 06/20/2022 CLINICAL DATA:  SPINAL STENOSIS.  LEFT-SIDED WEAKNESS.  RECENT FALL. EXAM: MRI LUMBAR SPINE WITHOUT CONTRAST TECHNIQUE: Multiplanar, multisequence MR imaging of the lumbar spine was performed. No intravenous contrast was administered. COMPARISON:  03/25/2017 FINDINGS: Segmentation:  Standard. Alignment: Grade 1 retrolisthesis at L1-2 and L2-3. Grade 1 anterolisthesis at L4-5. Vertebrae: Posterior fusion instrumentation extends from L3-L5 with screws at the L3 and L5 levels. There is no acute fracture. The L4 level is posteriorly decompressed. Conus medullaris and cauda equina: Conus extends to the L1 level. Conus and cauda equina appear normal. Paraspinal and other soft tissues: Negative Disc levels: T12-L1: Unchanged small disc bulge without spinal canal stenosis. L1-L2: Mild worsening disc bulge with endplate osteophytes. Progression of severe spinal canal stenosis. No neural foraminal stenosis. L2-L3: Unchanged disc bulge with endplate spurring. Unchanged severe spinal canal stenosis. Unchanged severe right and moderate left neural foraminal stenosis. L3-L4: Interbody fusion with posterior instrumentation connecting L3 to  L5. No spinal canal stenosis. No neural foraminal stenosis. L4-L5: Interbody fusion with posterior instrumentation connecting L5 to L3. No spinal canal stenosis. No neural foraminal stenosis. L5-S1: Unchanged disc bulge. Unchanged moderate spinal canal stenosis. Unchanged severe left neural foraminal stenosis. Visualized sacrum: Normal. IMPRESSION: 1. Progression of severe spinal canal stenosis at L1-L2. 2. Unchanged severe spinal canal stenosis and severe right, moderate left neural foraminal stenosis at L2-L3. 3. Unchanged moderate L5-S1 spinal canal stenosis and severe left neural foraminal stenosis. 4. Posterior fusion instrumentation from L3-L5 without residual spinal canal stenosis. Electronically Signed   By: Ulyses Jarred M.D.   On: 06/20/2022 22:41   DG Chest Port 1 View  Result Date: 06/20/2022 CLINICAL DATA:  Increased weakness; questionable sepsis EXAM: PORTABLE CHEST 1 VIEW COMPARISON:  Radiographs 03/25/2018 FINDINGS: Stable cardiomegaly. Sternotomy and CABG. Aortic atherosclerotic calcification. Elevated left hemidiaphragm and left basilar atelectasis. No focal consolidation, pleural effusion, or pneumothorax. No acute osseous abnormality. IMPRESSION: No change from 03/25/2018. Stable cardiomegaly and aortic atherosclerosis. Elevated left hemidiaphragm with left basilar atelectasis. Electronically Signed   By: Placido Sou M.D.   On: 06/20/2022 21:38  CT Head Wo Contrast  Result Date: 06/20/2022 CLINICAL DATA:  Head trauma, minor (Age >= 65y) EXAM: CT HEAD WITHOUT CONTRAST TECHNIQUE: Contiguous axial images were obtained from the base of the skull through the vertex without intravenous contrast. RADIATION DOSE REDUCTION: This exam was performed according to the departmental dose-optimization program which includes automated exposure control, adjustment of the mA and/or kV according to patient size and/or use of iterative reconstruction technique. COMPARISON:  06/13/2022 FINDINGS: Brain: No  acute intracranial abnormality. Specifically, no hemorrhage, hydrocephalus, mass lesion, acute infarction, or significant intracranial injury. Age related volume loss. Vascular: No hyperdense vessel or unexpected calcification. Skull: No acute calvarial abnormality. Sinuses/Orbits: Clear Other: None IMPRESSION: No acute intracranial abnormality. Electronically Signed   By: Rolm Baptise M.D.   On: 06/20/2022 21:27     Data Reviewed: Relevant notes from primary care and specialist visits, past discharge summaries as available in EHR, including Care Everywhere. Prior diagnostic testing as pertinent to current admission diagnoses Updated medications and problem lists for reconciliation ED course, including vitals, labs, imaging, treatment and response to treatment Triage notes, nursing and pharmacy notes and ED provider's notes Notable results as noted in HPI   Assessment and Plan: * Fall at home, initial encounter Ambulatory dysfunction, ambulant with walker at baseline Spinal stenosis, severe mid bilateral lower extremity weakness History of lumbar fusion L3-L5 PT and TOC consult Fall precautions Pain control  Atrial fibrillation (HCC) Chronic anticoagulation Rate controlled Continue verapamil and continue Eliquis Hemoglobin stable.  Monitor periodically as patient has history of GI bleeding  History of GI bleed No evidence of bleeding and hemoglobin at baseline GI bleed May 2021 while on Eliquis, not requiring transfusion:EGD and colonoscopy positive only for hemorrhoids  Stage 3a chronic kidney disease ([place]) Renal function at baseline  Chronic diastolic heart failure (HCC) Compensated Continue furosemide and spironolactone as well as ramipril  Essential hypertension, benign Continue ramipril, verapamil  Coronary artery disease of native artery of native heart with stable angina pectoris (HCC) No complaints of chest pain and EKG nonacute Continue ramipril, Zetia, Repatha,  Eliquis     DVT prophylaxis: Lovenox  Consults: none  Advance Care Planning:   Code Status: Prior   Family Communication: none  Disposition Plan: Back to previous home environment  Severity of Illness: The appropriate patient status for this patient is OBSERVATION. Observation status is judged to be reasonable and necessary in order to provide the required intensity of service to ensure the patient's safety. The patient's presenting symptoms, physical exam findings, and initial radiographic and laboratory data in the context of their medical condition is felt to place them at decreased risk for further clinical deterioration. Furthermore, it is anticipated that the patient will be medically stable for discharge from the hospital within 2 midnights of admission.   Author: Athena Masse, MD 06/21/2022 12:37 AM  For on call review [email or web address].

## 2022-06-21 NOTE — Assessment & Plan Note (Signed)
Continue ramipril, verapamil

## 2022-06-21 NOTE — Assessment & Plan Note (Signed)
Renal function at baseline 

## 2022-06-21 NOTE — Progress Notes (Signed)
PROGRESS NOTE    Charlotte Sessions, MD  WNI:[id number] DOB: 1936-03-22 DOA: 06/20/2022 PCP: [name], MD   Brief Narrative:  86 y.o. female with medical history significant for Paroxysmal atrial fibrillation on Eliquis, CAD s/p CABG, diastolic CHF, GI bleed May 2021 while on Eliquis (EGD and colonoscopy positive only for hemorrhoids), depression, CKD 3A, spinal stenosis ambulant with a walker at baseline who presents to the ED with protracted weakness and progressively worsening difficulty and pain ambulating from her baseline of using her walker since suffering a fall a week prior. Imaging significant for lumbar spinal MRI showing progression of severe spinal canal stenosis at L1-L2, otherwise unchanged.  Chest x-ray stable and CT head nonacute   Assessment & Plan:  Principal Problem:   Fall at home, initial encounter Active Problems:   Ambulatory dysfunction   Spinal stenosis   Atrial fibrillation ([place])   Coronary artery disease of native artery of native heart with stable angina pectoris ([place])   Essential hypertension, benign   Chronic diastolic heart failure (HCC)   Stage 3a chronic kidney disease (HCC)   History of GI bleed   Chronic anticoagulation     Assessment and Plan: * Fall at home, initial encounter Ambulatory dysfunction, ambulant with walker at baseline Spinal stenosis, severe mid bilateral lower extremity weakness History of lumbar fusion L3-L5 PT and TOC consult Fall precautions Pain control = Tylenol BID, Tramadol prn, flexiril prn. Decadron daily  She wants to avoid surgery.   Atrial fibrillation (HCC) Chronic anticoagulation Currently rate controlled on verapamil 180 mg twice daily.  Continue Eliquis 5 mg twice daily.  Hemoglobin for now has remained stable but does have history of GI bleeding.  History of GI bleed History of GERD No evidence of bleeding and hemoglobin at baseline GI bleed May 2021 while on Eliquis, not requiring transfusion:EGD and  colonoscopy positive only for hemorrhoids  Stage 3a chronic kidney disease ([place]) Renal function at baseline  Chronic diastolic heart failure ([place]); ef 60% in 2021 Currently appears to be compensated.  Continue Aldactone 25 mg daily.  Essential hypertension, benign Continue ramipril and verapamil  Coronary artery disease of native artery of native heart with stable angina pectoris ([place]) Status post CABG 1998 No complaints of chest pain.  Continue Eliquis, Zetia, Aldactone, ramipril and verapamil  Hyperlipidemia - On Zetia      DVT prophylaxis: Eliquis Code Status: DNR Family Communication:  None  Status is: Observation Still having significant back pain. Will need better pain control.   Subjective: Still has quite a bit of low back pain causing ambulatory issues.  Uses walker at home to get around.    Examination:  General exam: Appears calm and comfortable  Respiratory system: Clear to auscultation. Respiratory effort normal. Cardiovascular system: S1 & S2 heard, RRR. No JVD, murmurs, rubs, gallops or clicks. No pedal edema. Gastrointestinal system: Abdomen is nondistended, soft and nontender. No organomegaly or masses felt. Normal bowel sounds heard. Central nervous system: Alert and oriented. No focal neurological deficits. Extremities: Strength 4+/5 of LLE, Strength 4/5 on RLE Skin: No rashes, lesions or ulcers Psychiatry: Judgement and insight appear normal. Mood & affect appropriate.     Objective: Vitals:   06/21/22 0100 06/21/22 0130 06/21/22 0309 06/21/22 0755  BP: (!) 143/82 (!) 133/92  (!) 149/73  Pulse: 97 100  93  Resp: '17 18  16  '$ Temp:  97.9 F (36.6 C)  98 F (36.7 C)  TempSrc:    Oral  SpO2: 98% 95%  95%  Weight:  80.9 kg 80.9 kg   Height:   '5\' 2"'$  (1.575 m)     Intake/Output Summary (Last 24 hours) at 06/21/2022 0811 Last data filed at 06/21/2022 0241 Gross per 24 hour  Intake --  Output 1 ml  Net -1 ml   Filed Weights   06/21/22 0130  06/21/22 0309  Weight: 80.9 kg 80.9 kg     Data Reviewed:   CBC: Recent Labs  Lab 06/20/22 1831  WBC 6.5  HGB 12.2  HCT 37.8  MCV 104.1*  PLT 578   Basic Metabolic Panel: Recent Labs  Lab 06/20/22 1831  NA 137  K 4.3  CL 101  CO2 25  GLUCOSE 108*  BUN 26*  CREATININE 1.21*  CALCIUM 9.7   GFR: Estimated Creatinine Clearance: 34.1 mL/min (A) (by C-G formula based on SCr of 1.21 mg/dL (H)). Liver Function Tests: Recent Labs  Lab 06/20/22 2136  AST 27  ALT 21  ALKPHOS 54  BILITOT 1.0  PROT 6.9  ALBUMIN 4.2   Recent Labs  Lab 06/20/22 2136  LIPASE 43   No results for input(s): "AMMONIA" in the last 168 hours. Coagulation Profile: No results for input(s): "INR", "PROTIME" in the last 168 hours. Cardiac Enzymes: No results for input(s): "CKTOTAL", "CKMB", "CKMBINDEX", "TROPONINI" in the last 168 hours. BNP (last 3 results) No results for input(s): "PROBNP" in the last 8760 hours. HbA1C: No results for input(s): "HGBA1C" in the last 72 hours. CBG: No results for input(s): "GLUCAP" in the last 168 hours. Lipid Profile: No results for input(s): "CHOL", "HDL", "LDLCALC", "TRIG", "CHOLHDL", "LDLDIRECT" in the last 72 hours. Thyroid Function Tests: No results for input(s): "TSH", "T4TOTAL", "FREET4", "T3FREE", "THYROIDAB" in the last 72 hours. Anemia Panel: No results for input(s): "VITAMINB12", "FOLATE", "FERRITIN", "TIBC", "IRON", "RETICCTPCT" in the last 72 hours. Sepsis Labs: Recent Labs  Lab 06/20/22 2136 06/20/22 2310  LATICACIDVEN 1.4 1.2    No results found for this or any previous visit (from the past 240 hour(s)).       Radiology Studies: MR LUMBAR SPINE WO CONTRAST  Result Date: 06/20/2022 CLINICAL DATA:  SPINAL STENOSIS.  LEFT-SIDED WEAKNESS.  RECENT FALL. EXAM: MRI LUMBAR SPINE WITHOUT CONTRAST TECHNIQUE: Multiplanar, multisequence MR imaging of the lumbar spine was performed. No intravenous contrast was administered. COMPARISON:   03/25/2017 FINDINGS: Segmentation:  Standard. Alignment: Grade 1 retrolisthesis at L1-2 and L2-3. Grade 1 anterolisthesis at L4-5. Vertebrae: Posterior fusion instrumentation extends from L3-L5 with screws at the L3 and L5 levels. There is no acute fracture. The L4 level is posteriorly decompressed. Conus medullaris and cauda equina: Conus extends to the L1 level. Conus and cauda equina appear normal. Paraspinal and other soft tissues: Negative Disc levels: T12-L1: Unchanged small disc bulge without spinal canal stenosis. L1-L2: Mild worsening disc bulge with endplate osteophytes. Progression of severe spinal canal stenosis. No neural foraminal stenosis. L2-L3: Unchanged disc bulge with endplate spurring. Unchanged severe spinal canal stenosis. Unchanged severe right and moderate left neural foraminal stenosis. L3-L4: Interbody fusion with posterior instrumentation connecting L3 to L5. No spinal canal stenosis. No neural foraminal stenosis. L4-L5: Interbody fusion with posterior instrumentation connecting L5 to L3. No spinal canal stenosis. No neural foraminal stenosis. L5-S1: Unchanged disc bulge. Unchanged moderate spinal canal stenosis. Unchanged severe left neural foraminal stenosis. Visualized sacrum: Normal. IMPRESSION: 1. Progression of severe spinal canal stenosis at L1-L2. 2. Unchanged severe spinal canal stenosis and severe right, moderate left neural foraminal stenosis at L2-L3. 3. Unchanged moderate  L5-S1 spinal canal stenosis and severe left neural foraminal stenosis. 4. Posterior fusion instrumentation from L3-L5 without residual spinal canal stenosis. Electronically Signed   By: Ulyses Jarred M.D.   On: 06/20/2022 22:41   DG Chest Port 1 View  Result Date: 06/20/2022 CLINICAL DATA:  Increased weakness; questionable sepsis EXAM: PORTABLE CHEST 1 VIEW COMPARISON:  Radiographs 03/25/2018 FINDINGS: Stable cardiomegaly. Sternotomy and CABG. Aortic atherosclerotic calcification. Elevated left  hemidiaphragm and left basilar atelectasis. No focal consolidation, pleural effusion, or pneumothorax. No acute osseous abnormality. IMPRESSION: No change from 03/25/2018. Stable cardiomegaly and aortic atherosclerosis. Elevated left hemidiaphragm with left basilar atelectasis. Electronically Signed   By: Placido Sou M.D.   On: 06/20/2022 21:38   CT Head Wo Contrast  Result Date: 06/20/2022 CLINICAL DATA:  Head trauma, minor (Age >= 65y) EXAM: CT HEAD WITHOUT CONTRAST TECHNIQUE: Contiguous axial images were obtained from the base of the skull through the vertex without intravenous contrast. RADIATION DOSE REDUCTION: This exam was performed according to the departmental dose-optimization program which includes automated exposure control, adjustment of the mA and/or kV according to patient size and/or use of iterative reconstruction technique. COMPARISON:  06/13/2022 FINDINGS: Brain: No acute intracranial abnormality. Specifically, no hemorrhage, hydrocephalus, mass lesion, acute infarction, or significant intracranial injury. Age related volume loss. Vascular: No hyperdense vessel or unexpected calcification. Skull: No acute calvarial abnormality. Sinuses/Orbits: Clear Other: None IMPRESSION: No acute intracranial abnormality. Electronically Signed   By: Rolm Baptise M.D.   On: 06/20/2022 21:27        Scheduled Meds:  apixaban  5 mg Oral BID   ezetimibe  10 mg Oral Daily   ferrous sulfate  325 mg Oral Daily   polyethylene glycol  8.5 g Oral Daily   ramipril  5 mg Oral Daily   spironolactone  25 mg Oral q morning   traZODone  150 mg Oral QHS   verapamil  180 mg Oral BID   Continuous Infusions:   LOS: 0 days   Time spent= 35 mins    Emaad Nanna Arsenio Loader, MD [facility]  If 7PM-7AM, please contact night-coverage  06/21/2022, 8:11 AM

## 2022-06-21 NOTE — Assessment & Plan Note (Signed)
No complaints of chest pain and EKG nonacute Continue ramipril, Zetia, Repatha, Eliquis

## 2022-06-21 NOTE — Assessment & Plan Note (Addendum)
No evidence of bleeding and hemoglobin at baseline GI bleed May 2021 while on Eliquis, not requiring transfusion:EGD and colonoscopy positive only for hemorrhoids

## 2022-06-21 NOTE — Evaluation (Signed)
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 Imaging:  CT head negative, MRI of lumbar spine, spinal stenosis   Clinical Impression  Patient agrees to PT assessment. States she cannot use RW, needs rollator. Brought up rollator from outpatient. Patient is able to stand from recliner with supervision. Ambulated around Nursing station with supervision. She leans over rollator for additional back support. ( This is baseline). She moves well and appears to be close to baseline with mobility, just has some LE weakness and decreased endurance. Patient will continue to benefit from skilled PT.           Recommendations for follow up therapy are one component of a multi-disciplinary discharge planning process, led by the attending physician.  Recommendations may be updated based on patient status, additional functional criteria and insurance authorization.  Follow Up Recommendations Home health PT      Assistance Recommended at Discharge Intermittent Supervision/Assistance  Patient can return home with the following  A little help with walking and/or transfers;A little help with bathing/dressing/bathroom;Assist for transportation    Equipment Recommendations None recommended by PT  Recommendations for Other Services       Functional Status Assessment Patient has had a recent decline in their functional status  and demonstrates the ability to make significant improvements in function in a reasonable and predictable amount of time.     Precautions / Restrictions Precautions Precautions: Fall Restrictions Weight Bearing Restrictions: No      Mobility  Bed Mobility               General bed mobility comments: NT patient received up in recliner    Transfers Overall transfer level: Modified independent Equipment used: Rollator (4 wheels)                    Ambulation/Gait Ambulation/Gait assistance: Supervision Gait Distance (Feet): 160 Feet Assistive device: Rollator (4 wheels) Gait Pattern/deviations: Step-through pattern, Trunk flexed Gait velocity: slightly decreased     General Gait Details: patient ambulating well with flexed posture on rollator. This is her baseline due to severe stenosis.  Stairs            Wheelchair Mobility    Modified Rankin (Stroke Patients Only)       Balance Overall balance assessment: Needs assistance, History of Falls Sitting-balance support: Feet supported Sitting balance-Leahy Scale: Good     Standing balance support: Bilateral upper extremity supported, During functional activity, Reliant on assistive device for balance Standing balance-Leahy Scale: Fair                               Pertinent Vitals/Pain Pain Assessment Pain Assessment: Faces Faces Pain Scale: Hurts a little bit Pain Location: back Pain Descriptors / Indicators: Discomfort Pain Intervention(s): Monitored during session    Home Living Family/patient expects to be discharged to:: Assisted living  Home Equipment: Rollator (4 wheels)      Prior Function Prior Level of Function : Independent/Modified Independent;History of Falls (last six months)             Mobility Comments: leans over onto rollator at baseline due to stenosis. ADLs Comments: mod I     Hand Dominance   Dominant Hand: Right     Extremity/Trunk Assessment   Upper Extremity Assessment Upper Extremity Assessment: Defer to OT evaluation    Lower Extremity Assessment Lower Extremity Assessment: Generalized weakness    Cervical / Trunk Assessment Cervical / Trunk Assessment: Kyphotic (severe stenosis, flexed trunk)  Communication   Communication: Expressive difficulties (voice is hoarse and sometimes difficult to hear/understand)  Cognition Arousal/Alertness: Awake/alert Behavior During Therapy: WFL for tasks assessed/performed Overall Cognitive Status: Within Functional Limits for tasks assessed                                          General Comments      Exercises     Assessment/Plan    PT Assessment Patient needs continued PT services  PT Problem List Decreased strength;Decreased mobility;Decreased activity tolerance;Decreased balance       PT Treatment Interventions Therapeutic exercise;Gait training;Balance training;Stair training;Functional mobility training;Therapeutic activities;Patient/family education    PT Goals (Current goals can be found in the Care Plan section)  Acute Rehab PT Goals Patient Stated Goal: to return home PT Goal Formulation: With patient Time For Goal Achievement: 06/28/22 Potential to Achieve Goals: Good    Frequency Min 2X/week     Co-evaluation               AM-PAC PT "6 Clicks" Mobility  Outcome Measure Help needed turning from your back to your side while in a flat bed without using bedrails?: A Little Help needed moving from lying on your back to sitting on the side of a flat bed without using bedrails?: A Little Help needed moving to and from a bed to a chair (including a wheelchair)?: A Little Help needed standing up from a chair using your arms (e.g., wheelchair or bedside chair)?: None Help needed to walk in hospital room?: A Little Help needed climbing 3-5 steps with a railing? : A Lot 6 Click Score: 18    End of Session  Equipment Utilized During Treatment: Gait belt Activity Tolerance: Patient tolerated treatment well Patient left: in chair;with call bell/phone within reach Nurse Communication: Mobility status PT Visit Diagnosis: Unsteadiness on feet (R26.81);Other abnormalities of gait and mobility (R26.89);History of falling (Z91.81);Muscle weakness (generalized) (M62.81)    Time: 1000-1015 PT Time Calculation (min) (ACUTE ONLY): 15 min   Charges:   PT Evaluation $PT Eval Low Complexity: 1 Low          Fotios Amos, PT, GCS 06/21/22,10:54 AM

## 2022-06-21 NOTE — ED Notes (Signed)
Pt assisted to toilet in room per request for need to urinate. While pt was voiding, she reported that her rectum had prolapsed. Pt reports daily hx of same. Pt able to reduce rectum back and requested to sit on the bench in the room for "at least 30 minutes so that it doesn't do it again when I stand up." Pt reports again that this is baseline for her and that she is not in any discomfort r/t prolapse. Pt is currently sitting on bench in room with full cardiac monitor in place. Pt denies other needs at this time. Pt is noted to be breathing unlabored speaking in full sentences with symmetric chest rise and fall. ED MD made aware and states no new orders or intervention needed at this time. Pt call bell and belongings are in reach of pt.

## 2022-06-21 NOTE — Assessment & Plan Note (Signed)
Ambulatory dysfunction, ambulant with walker at baseline Spinal stenosis, severe mid bilateral lower extremity weakness History of lumbar fusion L3-L5 PT and TOC consult Fall precautions Pain control

## 2022-06-21 NOTE — Hospital Course (Signed)
atrial fibrillation on chronic anticoagulation therapy with Eliquis, history of coronary artery disease status post CABG, chronic diastolic dysfunction CHF,GI bleed while on  eliquis (EGD and colonoscopy positive only for hemorrhoids )in 02/2020,depression

## 2022-06-22 DIAGNOSIS — M4800 Spinal stenosis, site unspecified: Secondary | ICD-10-CM | POA: Diagnosis not present

## 2022-06-22 DIAGNOSIS — Y92009 Unspecified place in unspecified non-institutional (private) residence as the place of occurrence of the external cause: Secondary | ICD-10-CM | POA: Diagnosis not present

## 2022-06-22 DIAGNOSIS — W19XXXA Unspecified fall, initial encounter: Secondary | ICD-10-CM | POA: Diagnosis not present

## 2022-06-22 DIAGNOSIS — R531 Weakness: Secondary | ICD-10-CM | POA: Diagnosis not present

## 2022-06-22 LAB — BASIC METABOLIC PANEL
Anion gap: 9 (ref 5–15)
BUN: 21 mg/dL (ref 8–23)
CO2: 23 mmol/L (ref 22–32)
Calcium: 9.8 mg/dL (ref 8.9–10.3)
Chloride: 104 mmol/L (ref 98–111)
Creatinine, Ser: 0.88 mg/dL (ref 0.44–1.00)
GFR, Estimated: >60 mL/min (ref >60)
Glucose, Bld: 146 mg/dL — ABNORMAL HIGH (ref 70–99)
Potassium: 4.1 mmol/L (ref 3.5–5.1)
Sodium: 136 mmol/L (ref 135–145)

## 2022-06-22 LAB — MAGNESIUM: Magnesium: 2.3 mg/dL (ref 1.7–2.4)

## 2022-06-22 MED ORDER — CYCLOBENZAPRINE HCL 5 MG PO TABS: 5.0000 mg | ORAL_TABLET | Freq: Two times a day (BID) | ORAL | 0 refills | Status: DC | PRN

## 2022-06-22 MED ORDER — DEXAMETHASONE 4 MG PO TABS: 4.0000 mg | ORAL_TABLET | Freq: Every day | ORAL | 0 refills | Status: DC

## 2022-06-22 NOTE — TOC Transition Note (Signed)
Transition of Care [facility]) - CM/SW Discharge Note   Patient Details  Name: Charlotte KELCH, MD MRN: [id number] Date of Birth: [birth year]  [facility] [place] CM/SW Contact:  Laurena Slimmer, RN Phone Number: 06/22/2022, 1:04 PM   Clinical Narrative:    11:40 am Spoke with patient to advised of discharge back to [facility].   11:50 Spoke with Seth Bake in admissions at [facility] to get number for report and to have facility arrange transport.   12:30pm FL2 and discharge summary sent to facility via hub. Spoke with Seth Bake advised transportation would arrive in 30 minutes. Nurse provided with the number to give report and advise patient ride was on the way. TOC signing off.           Patient Goals and CMS Choice        Discharge Placement                       Discharge Plan and Services                                     Social Determinants of Health (SDOH) Interventions     Readmission Risk Interventions     No data to display
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
## 2022-06-22 NOTE — Telephone Encounter (Signed)
Called x2 lvm x2 for Charlotte Wagner from [facility] to return call to give [place] a good fax # to fax lab orders for pt.

## 2022-06-22 NOTE — Plan of Care (Signed)

## 2022-06-22 NOTE — Discharge Summary (Signed)
Physician Discharge Summary  Charlotte Sessions, MD OBS:[id number] DOB: [birth year] DOA: 06/20/2022  PCP: [name], MD  Admit date: 06/20/2022 Discharge date: 06/22/2022  Admitted From: [facility] Disposition: [facility] [place]  Recommendations for Outpatient Follow-up:  Follow up with PCP in 1-2 weeks Please obtain BMP/CBC in one week your next doctors visit.  Decadron oral daily for 4 more days.  As needed Flexeril. Advised to continue her home tramadol and Tylenol. She did have a large acute ecchymosis of her left thigh where she fell.  Hemoglobin remained stable but advised to continue to monitor this as she is on Eliquis. Hemoglobin is stable    Discharge Condition: Stable CODE STATUS: DNR Diet recommendation: Heart healthy  Brief/Interim Summary:  86 y.o. female with medical history significant for Paroxysmal atrial fibrillation on Eliquis, CAD s/p CABG, diastolic CHF, GI bleed May 2021 while on Eliquis (EGD and colonoscopy positive only for hemorrhoids), depression, CKD 3A, spinal stenosis ambulant with a walker at baseline who presents to the ED with protracted weakness and progressively worsening difficulty and pain ambulating from her baseline of using her walker since suffering a fall a week prior. Imaging significant for lumbar spinal MRI showing progression of severe spinal canal stenosis at L1-L2, otherwise unchanged.  Chest x-ray stable and CT head nonacute. Patient did not want any surgical intervention and she did well with steroids and pain control.  She also did well with physical therapy.  Today she is stable for discharge with home health.     Assessment & Plan:  Principal Problem:   Fall at home, initial encounter Active Problems:   Ambulatory dysfunction   Spinal stenosis   Atrial fibrillation ([place])   Coronary artery disease of native artery of native heart with stable angina pectoris ([place])   Essential hypertension, benign   Chronic diastolic heart failure (HCC)    Stage 3a chronic kidney disease (HCC)   History of GI bleed   Chronic anticoagulation       Assessment and Plan: * Fall at home, initial encounter Ambulatory dysfunction, ambulant with walker at baseline Spinal stenosis, severe mid bilateral lower extremity weakness History of lumbar fusion L3-L5 PT/OT recommends home health therefore arrangements made. Pain control = Tylenol BID, Tramadol prn, flexiril prn.  Decadron oral for 4 more days.  She would like to avoid surgery if possible.   Atrial fibrillation (HCC) Chronic anticoagulation Currently rate controlled on verapamil 180 mg twice daily.  Continue Eliquis 5 mg twice daily.    History of GI bleed History of GERD No evidence of bleeding and hemoglobin at baseline GI bleed May 2021 while on Eliquis, not requiring transfusion:EGD and colonoscopy positive only for hemorrhoids   Stage 3a chronic kidney disease ([place]) Renal function at baseline   Chronic diastolic heart failure ([place]); ef 60% in 2021 Currently appears to be compensated.  Continue Aldactone 25 mg daily.   Essential hypertension, benign Continue ramipril and verapamil   Coronary artery disease of native artery of native heart with stable angina pectoris ([place]) Status post CABG 1998 No complaints of chest pain.  Continue Eliquis, Zetia, Aldactone, ramipril and verapamil   Hyperlipidemia - On Zetia  She did have a large acute ecchymosis of her left thigh where she fell.  Hemoglobin remained stable but advised to continue to monitor this as she is on Eliquis.   Discharge Diagnoses:  Principal Problem:   Fall at home, initial encounter Active Problems:   Ambulatory dysfunction   Spinal stenosis   Atrial  fibrillation ([place])   Coronary artery disease of native artery of native heart with stable angina pectoris ([place])   Essential hypertension, benign   Chronic diastolic heart failure (HCC)   Stage 3a chronic kidney disease ([place])   History of GI bleed   Chronic  anticoagulation      Consultations: None  Subjective: Tells me she is doing much better today no complaints at all.  Hemoglobin remained stable.  Discharge Exam: Vitals:   06/22/22 0553 06/22/22 0821  BP: (!) 142/77 132/67  Pulse: 81 81  Resp: (!) 22   Temp: 97.8 F (36.6 C)   SpO2: 96% 94%   Vitals:   06/21/22 1732 06/21/22 2051 06/22/22 0553 06/22/22 0821  BP: 129/88 124/84 (!) 142/77 132/67  Pulse: 95 (!) 101 81 81  Resp: 16 20 (!) 22   Temp: 98.3 F (36.8 C) 98.7 F (37.1 C) 97.8 F (36.6 C)   TempSrc:      SpO2: 94% 97% 96% 94%  Weight:      Height:        General: Pt is alert, awake, not in acute distress Cardiovascular: RRR, S1/S2 +, no rubs, no gallops Respiratory: CTA bilaterally, no wheezing, no rhonchi Abdominal: Soft, NT, ND, bowel sounds + Extremities: left lateral thigh ecchymosis. Good strenght in all extremities. No pain.   Discharge Instructions   Allergies as of 06/22/2022       Reactions   Beta Adrenergic Blockers Shortness Of Breath   DEPRESSION/HYPOTENSION   Brilinta [ticagrelor] Shortness Of Breath   Caused AFIB   Albuterol    rigors   Antihistamines, Diphenhydramine-type    Muscle spasms   Aspirin Other (See Comments)   Heartburn    Nsaids Other (See Comments)   ULCER HISTORY & CURRENTLY ON BLOOD THINNERS   Penicillins Hives, Other (See Comments)   Has patient had a PCN reaction causing immediate rash, facial/tongue/throat swelling, SOB or lightheadedness with hypotension: No Has patient had a PCN reaction causing severe rash involving mucus membranes or skin necrosis: No Has patient had a PCN reaction that required hospitalization No Has patient had a PCN reaction occurring within the last 10 years: No If all of the above answers are "NO", then may proceed with Cephalosporin use.   Statins Other (See Comments)   Muscle weakness   Sulfasalazine Rash   At mouth   Tetracycline Rash   Tetracyclines & Related Rash         Medication List     TAKE these medications    acetaminophen 500 MG tablet Commonly known as: TYLENOL Take 1,000 mg by mouth 2 (two) times daily.   amitriptyline 25 MG tablet Commonly known as: ELAVIL TAKE 1 TABLET BY MOUTH AT BEDTIME   ascorbic acid 500 MG tablet Commonly known as: VITAMIN C Take 500 mg by mouth at bedtime.   Atrovent HFA 17 MCG/ACT inhaler Generic drug: ipratropium INHALE 2 PUFFS INTO THE LUNGS EVERY 6 HOURS AS NEEDED FOR WHEEZING   b complex vitamins tablet Take 1 tablet by mouth daily.   clotrimazole 10 MG troche Commonly known as: MYCELEX Take 10 mg by mouth as needed.   conjugated estrogens 0.625 MG/GM vaginal cream Commonly known as: Premarin Apply 0.'5mg'$  (pea-sized amount)  just inside the vaginal introitus with a finger-tip on  Monday, Wednesday and Friday nights.   CoQ-10 200 MG Caps Take 200 mg by mouth daily.   CRANBERRY PO Take 1 capsule by mouth in the morning, at noon, and at  bedtime.   cyanocobalamin 1000 MCG tablet Commonly known as: VITAMIN B12 Take 1,000 mcg by mouth daily.   cyclobenzaprine 5 MG tablet Commonly known as: FLEXERIL Take 1 tablet (5 mg total) by mouth 2 (two) times daily as needed for muscle spasms.   dexamethasone 4 MG tablet Commonly known as: DECADRON Take 1 tablet (4 mg total) by mouth daily for 5 days. Start taking on: June 23, 2022   Eliquis 5 MG Tabs tablet Generic drug: apixaban TAKE 1 TABLET BY MOUTH TWICE DAILY   ezetimibe 10 MG tablet Commonly known as: ZETIA TAKE 1 TABLET BY MOUTH DAILY   FeroSul 325 (65 FE) MG tablet Generic drug: ferrous sulfate TAKE ONE TABLET EVERY DAY   furosemide 40 MG tablet Commonly known as: LASIX TAKE 1 TABLET ('40MG'$ ) IN THE MORNING AND TAKE 1/2 TABLET ('20MG'$ ) IN THE EVENING   HAIR/SKIN/NAILS PO Take 1 tablet by mouth daily.   hydrocortisone 2.5 % cream APPLY TO LICHEN PLANUS AS DIRECTED   ketoconazole 2 % cream Commonly known as: NIZORAL Apply 1  application topically daily.   loratadine 10 MG tablet Commonly known as: CLARITIN Take 10 mg by mouth daily.   meclizine 25 MG tablet Commonly known as: ANTIVERT Take 1 tablet (25 mg total) by mouth 3 (three) times daily as needed for dizziness.   mupirocin ointment 2 % Commonly known as: Bactroban Apply to affected area bid   pantoprazole 40 MG tablet Commonly known as: PROTONIX TAKE 1 TABLET BY MOUTH TWICE DAILY   polyethylene glycol powder 17 GM/SCOOP powder Commonly known as: GLYCOLAX/MIRALAX Take 8.5 g by mouth daily. Hold if diarrhea   ramipril 5 MG capsule Commonly known as: ALTACE TAKE 1 CAPSULE BY MOUTH ONCE DAILY   Repatha SureClick 742 MG/ML [place] Generic drug: Evolocumab INJECT 1 PEN EVERY 14 DAYS AS DIRECTED   spironolactone 25 MG tablet Commonly known as: ALDACTONE Take 1 tablet (25 mg total) by mouth every morning.   traMADol 50 MG tablet Commonly known as: ULTRAM TAKE ONE TABLET TWICE DAILY AS NEEDED   traZODone 150 MG tablet Commonly known as: DESYREL Two tablets by mouth at bedtime as needed for sleep   triamcinolone cream 0.1 % Commonly known as: KENALOG Apply topically 2 (two) times daily.   verapamil 180 MG CR tablet Commonly known as: CALAN-SR TAKE ONE (1) TABLET BY MOUTH TWO TIMES PER DAY   Vitamin D3 50 MCG (2000 UT) Tabs Take 2,000 Units by mouth 2 (two) times a week. Tuesday and Saturdays.        Follow-up Information     [name], MD Follow up in 1 week(s).   Specialty: Internal Medicine Contact information: [address] [place] [address] [phone]                Allergies  Allergen Reactions   Beta Adrenergic Blockers Shortness Of Breath    DEPRESSION/HYPOTENSION   Brilinta [Ticagrelor] Shortness Of Breath    Caused AFIB   Albuterol     rigors   Antihistamines, Diphenhydramine-Type     Muscle spasms   Aspirin Other (See Comments)    Heartburn    Nsaids Other (See  Comments)    ULCER HISTORY & CURRENTLY ON BLOOD THINNERS   Penicillins Hives and Other (See Comments)    Has patient had a PCN reaction causing immediate rash, facial/tongue/throat swelling, SOB or lightheadedness with hypotension: No Has patient had a PCN reaction causing severe rash involving mucus membranes or skin  necrosis: No Has patient had a PCN reaction that required hospitalization No Has patient had a PCN reaction occurring within the last 10 years: No If all of the above answers are "NO", then may proceed with Cephalosporin use.    Statins Other (See Comments)    Muscle weakness   Sulfasalazine Rash    At mouth   Tetracycline Rash   Tetracyclines & Related Rash    You were cared for by a hospitalist during your hospital stay. If you have any questions about your discharge medications or the care you received while you were in the hospital after you are discharged, you can call the unit and asked to speak with the hospitalist on call if the hospitalist that took care of you is not available. Once you are discharged, your primary care physician will handle any further medical issues. Please note that no refills for any discharge medications will be authorized once you are discharged, as it is imperative that you return to your primary care physician (or establish a relationship with a primary care physician if you do not have one) for your aftercare needs so that they can reassess your need for medications and monitor your lab values.   Procedures/Studies: MR LUMBAR SPINE WO CONTRAST  Result Date: 06/20/2022 CLINICAL DATA:  SPINAL STENOSIS.  LEFT-SIDED WEAKNESS.  RECENT FALL. EXAM: MRI LUMBAR SPINE WITHOUT CONTRAST TECHNIQUE: Multiplanar, multisequence MR imaging of the lumbar spine was performed. No intravenous contrast was administered. COMPARISON:  03/25/2017 FINDINGS: Segmentation:  Standard. Alignment: Grade 1 retrolisthesis at L1-2 and L2-3. Grade 1 anterolisthesis at L4-5.  Vertebrae: Posterior fusion instrumentation extends from L3-L5 with screws at the L3 and L5 levels. There is no acute fracture. The L4 level is posteriorly decompressed. Conus medullaris and cauda equina: Conus extends to the L1 level. Conus and cauda equina appear normal. Paraspinal and other soft tissues: Negative Disc levels: T12-L1: Unchanged small disc bulge without spinal canal stenosis. L1-L2: Mild worsening disc bulge with endplate osteophytes. Progression of severe spinal canal stenosis. No neural foraminal stenosis. L2-L3: Unchanged disc bulge with endplate spurring. Unchanged severe spinal canal stenosis. Unchanged severe right and moderate left neural foraminal stenosis. L3-L4: Interbody fusion with posterior instrumentation connecting L3 to L5. No spinal canal stenosis. No neural foraminal stenosis. L4-L5: Interbody fusion with posterior instrumentation connecting L5 to L3. No spinal canal stenosis. No neural foraminal stenosis. L5-S1: Unchanged disc bulge. Unchanged moderate spinal canal stenosis. Unchanged severe left neural foraminal stenosis. Visualized sacrum: Normal. IMPRESSION: 1. Progression of severe spinal canal stenosis at L1-L2. 2. Unchanged severe spinal canal stenosis and severe right, moderate left neural foraminal stenosis at L2-L3. 3. Unchanged moderate L5-S1 spinal canal stenosis and severe left neural foraminal stenosis. 4. Posterior fusion instrumentation from L3-L5 without residual spinal canal stenosis. Electronically Signed   By: Ulyses Jarred M.D.   On: 06/20/2022 22:41   DG Chest Port 1 View  Result Date: 06/20/2022 CLINICAL DATA:  Increased weakness; questionable sepsis EXAM: PORTABLE CHEST 1 VIEW COMPARISON:  Radiographs 03/25/2018 FINDINGS: Stable cardiomegaly. Sternotomy and CABG. Aortic atherosclerotic calcification. Elevated left hemidiaphragm and left basilar atelectasis. No focal consolidation, pleural effusion, or pneumothorax. No acute osseous abnormality.  IMPRESSION: No change from 03/25/2018. Stable cardiomegaly and aortic atherosclerosis. Elevated left hemidiaphragm with left basilar atelectasis. Electronically Signed   By: Placido Sou M.D.   On: 06/20/2022 21:38   CT Head Wo Contrast  Result Date: 06/20/2022 CLINICAL DATA:  Head trauma, minor (Age >= 65y) EXAM: CT HEAD WITHOUT  CONTRAST TECHNIQUE: Contiguous axial images were obtained from the base of the skull through the vertex without intravenous contrast. RADIATION DOSE REDUCTION: This exam was performed according to the departmental dose-optimization program which includes automated exposure control, adjustment of the mA and/or kV according to patient size and/or use of iterative reconstruction technique. COMPARISON:  06/13/2022 FINDINGS: Brain: No acute intracranial abnormality. Specifically, no hemorrhage, hydrocephalus, mass lesion, acute infarction, or significant intracranial injury. Age related volume loss. Vascular: No hyperdense vessel or unexpected calcification. Skull: No acute calvarial abnormality. Sinuses/Orbits: Clear Other: None IMPRESSION: No acute intracranial abnormality. Electronically Signed   By: Rolm Baptise M.D.   On: 06/20/2022 21:27   DG Hip Unilat With Pelvis 2-3 Views Left  Result Date: 06/13/2022 CLINICAL DATA:  Trauma, fall EXAM: DG HIP (WITH OR WITHOUT PELVIS) 2-3V LEFT COMPARISON:  None Available. FINDINGS: There is previous left hip arthroplasty. No recent fracture or dislocation is seen. Smoothly marginated calcifications lateral to the left acetabulum and greater trochanter of proximal left femur may be residual from previous trauma or previous surgery. Osteopenia is seen and bony structures. There is lumbar fusion. IMPRESSION: Previous left hip arthroplasty. No recent fracture or dislocation is seen. Electronically Signed   By: Elmer Picker M.D.   On: 06/13/2022 18:19   CT Lumbar Spine Wo Contrast  Result Date: 06/13/2022 : FALL.  Back trauma, no prior  imaging (Age >= 16y) EXAM: CT LUMBAR SPINE WITHOUT CONTRAST TECHNIQUE: Multidetector CT imaging of the lumbar spine was performed without intravenous contrast administration. Multiplanar CT image reconstructions were also generated. RADIATION DOSE REDUCTION: This exam was performed according to the departmental dose-optimization program which includes automated exposure control, adjustment of the mA and/or kV according to patient size and/or use of iterative reconstruction technique. COMPARISON:  MRI lumbar spine 03/25/2017 FINDINGS: Segmentation: 5 lumbar type vertebrae. Alignment: Slightly worsened grade 1 anterolisthesis of L4 on L5. Vertebrae: L3-L5 posterolateral surgical hardware with multilevel severe degenerative changes spine. L4 laminectomy. Vague cortical irregularity along the right L1-L2 transverse process likely old healed fractures. No definite acute displaced fracture or focal pathologic process. Paraspinal and other soft tissues: Negative. Disc levels: Multilevel severe intervertebral disc space narrowing and vacuum phenomenon. Other: Atherosclerotic plaque. Status post cholecystectomy. Nonspecific Coarsely calcified lesion within the pelvis partially visualized that could possibly represent degenerative uterine fibroids. IMPRESSION: 1. No definite acute displaced fracture or dislocation in a patient with L3-L5 surgical hardware. 2. Multilevel severe degenerative changes of the spine with slightly worsened grade 1 anterolisthesis of L4 on L5. 3.  Aortic Atherosclerosis (ICD10-I70.0). Electronically Signed   By: Iven Finn M.D.   On: 06/13/2022 18:11   CT Head Wo Contrast  Result Date: 06/13/2022 CLINICAL DATA:  Head trauma, minor (Age >= 65y); Neck trauma (Age >= 65y). Fall EXAM: CT HEAD WITHOUT CONTRAST CT CERVICAL SPINE WITHOUT CONTRAST TECHNIQUE: Multidetector CT imaging of the head and cervical spine was performed following the standard protocol without intravenous contrast. Multiplanar  CT image reconstructions of the cervical spine were also generated. RADIATION DOSE REDUCTION: This exam was performed according to the departmental dose-optimization program which includes automated exposure control, adjustment of the mA and/or kV according to patient size and/or use of iterative reconstruction technique. COMPARISON:  CT head and neck 03/19/2018 FINDINGS: CT HEAD FINDINGS BRAIN: Cerebral ventricle sizes are concordant with the degree of cerebral volume loss. Patchy and confluent areas of decreased attenuation are noted throughout the deep and periventricular white matter of the cerebral hemispheres bilaterally, compatible with chronic  microvascular ischemic disease. No evidence of large-territorial acute infarction. No parenchymal hemorrhage. No mass lesion. No extra-axial collection. No mass effect or midline shift. No hydrocephalus. Basilar cisterns are patent. Empty sella. Vascular: No hyperdense vessel. Skull: No acute fracture or focal lesion. Sinuses/Orbits: Paranasal sinuses and mastoid air cells are clear. Bilateral lens replacement. Otherwise the orbits are unremarkable. Other: None. CT CERVICAL SPINE FINDINGS Alignment: Grade 1 anterolisthesis of C4 on C5. Skull base and vertebrae: Multilevel at least moderate to severe degenerative changes spine most prominent at the C5-C7 level. No acute fracture. No aggressive appearing focal osseous lesion or focal pathologic process. Soft tissues and spinal canal: No prevertebral fluid or swelling. No visible canal hematoma. Upper chest: Unremarkable. Other: Similar-appearing asymmetrically larger right thyroid gland point to the left with associated calcified lesion subcentimeter hypodensity. In the setting of significant comorbidities or limited life expectancy, no follow-up recommended (ref: J Am Coll Radiol. 2015 Feb;12(2): 143-50). IMPRESSION: 1. No acute intracranial abnormality. 2. No acute displaced fracture or traumatic listhesis of the  cervical spine. 3. Empty sella. Findings is often a normal anatomic variant but can be associated with idiopathic intracranial hypertension (pseudotumor cerebri). Electronically Signed   By: Iven Finn M.D.   On: 06/13/2022 18:05   CT Cervical Spine Wo Contrast  Result Date: 06/13/2022 CLINICAL DATA:  Head trauma, minor (Age >= 65y); Neck trauma (Age >= 65y). Fall EXAM: CT HEAD WITHOUT CONTRAST CT CERVICAL SPINE WITHOUT CONTRAST TECHNIQUE: Multidetector CT imaging of the head and cervical spine was performed following the standard protocol without intravenous contrast. Multiplanar CT image reconstructions of the cervical spine were also generated. RADIATION DOSE REDUCTION: This exam was performed according to the departmental dose-optimization program which includes automated exposure control, adjustment of the mA and/or kV according to patient size and/or use of iterative reconstruction technique. COMPARISON:  CT head and neck 03/19/2018 FINDINGS: CT HEAD FINDINGS BRAIN: Cerebral ventricle sizes are concordant with the degree of cerebral volume loss. Patchy and confluent areas of decreased attenuation are noted throughout the deep and periventricular white matter of the cerebral hemispheres bilaterally, compatible with chronic microvascular ischemic disease. No evidence of large-territorial acute infarction. No parenchymal hemorrhage. No mass lesion. No extra-axial collection. No mass effect or midline shift. No hydrocephalus. Basilar cisterns are patent. Empty sella. Vascular: No hyperdense vessel. Skull: No acute fracture or focal lesion. Sinuses/Orbits: Paranasal sinuses and mastoid air cells are clear. Bilateral lens replacement. Otherwise the orbits are unremarkable. Other: None. CT CERVICAL SPINE FINDINGS Alignment: Grade 1 anterolisthesis of C4 on C5. Skull base and vertebrae: Multilevel at least moderate to severe degenerative changes spine most prominent at the C5-C7 level. No acute fracture. No  aggressive appearing focal osseous lesion or focal pathologic process. Soft tissues and spinal canal: No prevertebral fluid or swelling. No visible canal hematoma. Upper chest: Unremarkable. Other: Similar-appearing asymmetrically larger right thyroid gland point to the left with associated calcified lesion subcentimeter hypodensity. In the setting of significant comorbidities or limited life expectancy, no follow-up recommended (ref: J Am Coll Radiol. 2015 Feb;12(2): 143-50). IMPRESSION: 1. No acute intracranial abnormality. 2. No acute displaced fracture or traumatic listhesis of the cervical spine. 3. Empty sella. Findings is often a normal anatomic variant but can be associated with idiopathic intracranial hypertension (pseudotumor cerebri). Electronically Signed   By: Iven Finn M.D.   On: 06/13/2022 18:05     The results of significant diagnostics from this hospitalization (including imaging, microbiology, ancillary and laboratory) are listed below for reference.  Microbiology: No results found for this or any previous visit (from the past 240 hour(s)).   Labs: BNP (last 3 results) No results for input(s): "BNP" in the last 8760 hours. Basic Metabolic Panel: Recent Labs  Lab 06/20/22 1831 06/22/22 0534  NA 137 136  K 4.3 4.1  CL 101 104  CO2 25 23  GLUCOSE 108* 146*  BUN 26* 21  CREATININE 1.21* 0.88  CALCIUM 9.7 9.8  MG  --  2.3   Liver Function Tests: Recent Labs  Lab 06/20/22 2136  AST 27  ALT 21  ALKPHOS 54  BILITOT 1.0  PROT 6.9  ALBUMIN 4.2   Recent Labs  Lab 06/20/22 2136  LIPASE 43   No results for input(s): "AMMONIA" in the last 168 hours. CBC: Recent Labs  Lab 06/20/22 1831 06/21/22 0835  WBC 6.5 6.4  HGB 12.2 12.9  HCT 37.8 38.8  MCV 104.1* 101.0*  PLT 224 233   Cardiac Enzymes: No results for input(s): "CKTOTAL", "CKMB", "CKMBINDEX", "TROPONINI" in the last 168 hours. BNP: Invalid input(s): "POCBNP" CBG: No results for input(s):  "GLUCAP" in the last 168 hours. D-Dimer No results for input(s): "DDIMER" in the last 72 hours. Hgb A1c No results for input(s): "HGBA1C" in the last 72 hours. Lipid Profile No results for input(s): "CHOL", "HDL", "LDLCALC", "TRIG", "CHOLHDL", "LDLDIRECT" in the last 72 hours. Thyroid function studies No results for input(s): "TSH", "T4TOTAL", "T3FREE", "THYROIDAB" in the last 72 hours.  Invalid input(s): "FREET3" Anemia work up No results for input(s): "VITAMINB12", "FOLATE", "FERRITIN", "TIBC", "IRON", "RETICCTPCT" in the last 72 hours. Urinalysis    Component Value Date/Time   COLORURINE STRAW (A) 06/20/2022 1831   APPEARANCEUR CLEAR (A) 06/20/2022 1831   APPEARANCEUR Clear 06/24/2018 1205   LABSPEC 1.005 06/20/2022 1831   PHURINE 7.0 06/20/2022 1831   GLUCOSEU NEGATIVE 06/20/2022 1831   GLUCOSEU NEGATIVE 03/18/2018 1542   HGBUR NEGATIVE 06/20/2022 1831   BILIRUBINUR NEGATIVE 06/20/2022 1831   BILIRUBINUR Negative 06/24/2018 1205   KETONESUR NEGATIVE 06/20/2022 1831   PROTEINUR NEGATIVE 06/20/2022 1831   UROBILINOGEN 1.0 03/18/2018 1542   NITRITE NEGATIVE 06/20/2022 1831   LEUKOCYTESUR NEGATIVE 06/20/2022 1831   Sepsis Labs Recent Labs  Lab 06/20/22 1831 06/21/22 0835  WBC 6.5 6.4   Microbiology No results found for this or any previous visit (from the past 240 hour(s)).   Time coordinating discharge:  I have spent 35 minutes face to face with the patient and on the ward discussing the patients care, assessment, plan and disposition with other care givers. >50% of the time was devoted counseling the patient about the risks and benefits of treatment/Discharge disposition and coordinating care.   SIGNED:   Damita Lack, MD  [facility] 06/22/2022, 11:54 AM   If 7PM-7AM, please contact night-coverage

## 2022-06-22 NOTE — Telephone Encounter (Signed)
Collier Salina from [facility] called and said that patient went to the hospital and will be released today. At this time no orders for urine are needed.

## 2022-06-25 ENCOUNTER — Telehealth: Payer: Self-pay

## 2022-06-25 NOTE — Telephone Encounter (Signed)
Paulla Dolly, RN, called from [facility] to state patient has been discharged from the hospital and needs to see Dr. [name] within a week.  Collier Salina states patient did fall about one week before going to the hospital.  Please let us know how to schedule.

## 2022-06-26 DIAGNOSIS — F419 Anxiety disorder, unspecified: Secondary | ICD-10-CM | POA: Diagnosis not present

## 2022-06-26 DIAGNOSIS — I4819 Other persistent atrial fibrillation: Secondary | ICD-10-CM | POA: Diagnosis not present

## 2022-06-26 DIAGNOSIS — L439 Lichen planus, unspecified: Secondary | ICD-10-CM | POA: Diagnosis not present

## 2022-06-26 DIAGNOSIS — Z741 Need for assistance with personal care: Secondary | ICD-10-CM | POA: Diagnosis not present

## 2022-06-26 DIAGNOSIS — N1831 Chronic kidney disease, stage 3a: Secondary | ICD-10-CM | POA: Diagnosis not present

## 2022-06-26 DIAGNOSIS — N390 Urinary tract infection, site not specified: Secondary | ICD-10-CM | POA: Diagnosis not present

## 2022-06-26 DIAGNOSIS — I10 Essential (primary) hypertension: Secondary | ICD-10-CM | POA: Diagnosis not present

## 2022-06-26 DIAGNOSIS — Z85820 Personal history of malignant melanoma of skin: Secondary | ICD-10-CM | POA: Diagnosis not present

## 2022-06-26 DIAGNOSIS — W19XXXD Unspecified fall, subsequent encounter: Secondary | ICD-10-CM | POA: Diagnosis not present

## 2022-06-26 DIAGNOSIS — M47892 Other spondylosis, cervical region: Secondary | ICD-10-CM | POA: Diagnosis not present

## 2022-06-26 DIAGNOSIS — F32A Depression, unspecified: Secondary | ICD-10-CM | POA: Diagnosis not present

## 2022-06-26 DIAGNOSIS — Z9181 History of falling: Secondary | ICD-10-CM | POA: Diagnosis not present

## 2022-06-26 DIAGNOSIS — M1611 Unilateral primary osteoarthritis, right hip: Secondary | ICD-10-CM | POA: Diagnosis not present

## 2022-06-26 DIAGNOSIS — I5032 Chronic diastolic (congestive) heart failure: Secondary | ICD-10-CM | POA: Diagnosis not present

## 2022-06-26 DIAGNOSIS — I480 Paroxysmal atrial fibrillation: Secondary | ICD-10-CM | POA: Diagnosis not present

## 2022-06-26 DIAGNOSIS — K449 Diaphragmatic hernia without obstruction or gangrene: Secondary | ICD-10-CM | POA: Diagnosis not present

## 2022-06-26 DIAGNOSIS — F324 Major depressive disorder, single episode, in partial remission: Secondary | ICD-10-CM | POA: Diagnosis not present

## 2022-06-26 DIAGNOSIS — R278 Other lack of coordination: Secondary | ICD-10-CM | POA: Diagnosis not present

## 2022-06-26 DIAGNOSIS — K623 Rectal prolapse: Secondary | ICD-10-CM | POA: Diagnosis not present

## 2022-06-26 DIAGNOSIS — G43909 Migraine, unspecified, not intractable, without status migrainosus: Secondary | ICD-10-CM | POA: Diagnosis not present

## 2022-06-26 DIAGNOSIS — M4800 Spinal stenosis, site unspecified: Secondary | ICD-10-CM | POA: Diagnosis not present

## 2022-06-26 DIAGNOSIS — I2510 Atherosclerotic heart disease of native coronary artery without angina pectoris: Secondary | ICD-10-CM | POA: Diagnosis not present

## 2022-06-26 DIAGNOSIS — E7800 Pure hypercholesterolemia, unspecified: Secondary | ICD-10-CM | POA: Diagnosis not present

## 2022-06-26 DIAGNOSIS — M47812 Spondylosis without myelopathy or radiculopathy, cervical region: Secondary | ICD-10-CM | POA: Diagnosis not present

## 2022-06-26 DIAGNOSIS — I130 Hypertensive heart and chronic kidney disease with heart failure and stage 1 through stage 4 chronic kidney disease, or unspecified chronic kidney disease: Secondary | ICD-10-CM | POA: Diagnosis not present

## 2022-06-26 DIAGNOSIS — I5033 Acute on chronic diastolic (congestive) heart failure: Secondary | ICD-10-CM | POA: Diagnosis not present

## 2022-06-26 DIAGNOSIS — Z66 Do not resuscitate: Secondary | ICD-10-CM | POA: Diagnosis not present

## 2022-06-26 DIAGNOSIS — K648 Other hemorrhoids: Secondary | ICD-10-CM | POA: Diagnosis not present

## 2022-06-26 DIAGNOSIS — M48061 Spinal stenosis, lumbar region without neurogenic claudication: Secondary | ICD-10-CM | POA: Diagnosis not present

## 2022-06-26 DIAGNOSIS — Z951 Presence of aortocoronary bypass graft: Secondary | ICD-10-CM | POA: Diagnosis not present

## 2022-06-26 DIAGNOSIS — Z87440 Personal history of urinary (tract) infections: Secondary | ICD-10-CM | POA: Diagnosis not present

## 2022-06-26 DIAGNOSIS — M6259 Muscle wasting and atrophy, not elsewhere classified, multiple sites: Secondary | ICD-10-CM | POA: Diagnosis not present

## 2022-06-26 DIAGNOSIS — G43801 Other migraine, not intractable, with status migrainosus: Secondary | ICD-10-CM | POA: Diagnosis not present

## 2022-06-26 DIAGNOSIS — I129 Hypertensive chronic kidney disease with stage 1 through stage 4 chronic kidney disease, or unspecified chronic kidney disease: Secondary | ICD-10-CM | POA: Diagnosis not present

## 2022-06-26 DIAGNOSIS — M6281 Muscle weakness (generalized): Secondary | ICD-10-CM | POA: Diagnosis not present

## 2022-06-26 DIAGNOSIS — M48062 Spinal stenosis, lumbar region with neurogenic claudication: Secondary | ICD-10-CM | POA: Diagnosis not present

## 2022-06-26 NOTE — Telephone Encounter (Signed)
If she is in skilled nursing, is the physician following her there?  Usually they follow while in skilled nursing and then we see after discharge.  Let me know if I need to do anything.

## 2022-06-26 NOTE — Telephone Encounter (Signed)
Paulla Dolly, RN, called from [facility] to let us know that patient has moved from assisted living to skilled nursing at [facility].    If Dr. [name] would still like to schedule the hospital follow-up visit, patient's number at [facility] is [phone].

## 2022-06-26 NOTE — Telephone Encounter (Signed)
Spoke with nurse at [facility] Patient will be followed by provider there until DC from skill nursing once patient s DC from skill nursing facility will call to schedule followup with PCP.

## 2022-06-27 ENCOUNTER — Non-Acute Institutional Stay (SKILLED_NURSING_FACILITY): Payer: Medicare Other | Admitting: Student

## 2022-06-27 ENCOUNTER — Encounter: Payer: Self-pay | Admitting: Student

## 2022-06-27 DIAGNOSIS — G43801 Other migraine, not intractable, with status migrainosus: Secondary | ICD-10-CM | POA: Diagnosis not present

## 2022-06-27 DIAGNOSIS — M48062 Spinal stenosis, lumbar region with neurogenic claudication: Secondary | ICD-10-CM | POA: Diagnosis not present

## 2022-06-27 DIAGNOSIS — E7800 Pure hypercholesterolemia, unspecified: Secondary | ICD-10-CM | POA: Diagnosis not present

## 2022-06-27 DIAGNOSIS — I5032 Chronic diastolic (congestive) heart failure: Secondary | ICD-10-CM

## 2022-06-27 DIAGNOSIS — L439 Lichen planus, unspecified: Secondary | ICD-10-CM

## 2022-06-27 DIAGNOSIS — Z7901 Long term (current) use of anticoagulants: Secondary | ICD-10-CM

## 2022-06-27 DIAGNOSIS — K449 Diaphragmatic hernia without obstruction or gangrene: Secondary | ICD-10-CM

## 2022-06-27 DIAGNOSIS — K623 Rectal prolapse: Secondary | ICD-10-CM

## 2022-06-27 DIAGNOSIS — N390 Urinary tract infection, site not specified: Secondary | ICD-10-CM | POA: Diagnosis not present

## 2022-06-27 DIAGNOSIS — M47892 Other spondylosis, cervical region: Secondary | ICD-10-CM

## 2022-06-27 DIAGNOSIS — I4819 Other persistent atrial fibrillation: Secondary | ICD-10-CM

## 2022-06-27 DIAGNOSIS — R2681 Unsteadiness on feet: Secondary | ICD-10-CM

## 2022-06-27 DIAGNOSIS — Y92009 Unspecified place in unspecified non-institutional (private) residence as the place of occurrence of the external cause: Secondary | ICD-10-CM

## 2022-06-27 DIAGNOSIS — I5033 Acute on chronic diastolic (congestive) heart failure: Secondary | ICD-10-CM

## 2022-06-27 DIAGNOSIS — I10 Essential (primary) hypertension: Secondary | ICD-10-CM | POA: Diagnosis not present

## 2022-06-27 DIAGNOSIS — F324 Major depressive disorder, single episode, in partial remission: Secondary | ICD-10-CM

## 2022-06-27 DIAGNOSIS — K648 Other hemorrhoids: Secondary | ICD-10-CM

## 2022-06-27 DIAGNOSIS — W19XXXA Unspecified fall, initial encounter: Secondary | ICD-10-CM

## 2022-06-27 MED ORDER — TRAMADOL HCL 50 MG PO TABS: ORAL_TABLET | ORAL | 1 refills | Status: DC

## 2022-06-27 NOTE — Progress Notes (Signed)
Provider:   Location:  [facility] Room Number: NO/103/A Place of Service:  SNF (31)  PCP: Dewayne Shorter, MD Extended Emergency Contact Information Primary Emergency Contact: Karmen Stabs [place] of [place] Phone: [phone] Relation: Daughter Secondary Emergency Contact: Doroteo Glassman [place] of [place] Phone: [phone] Relation: Son  Code Status: DNR Goals of Care: Advanced Directive information    06/27/2022   11:15 AM  Advanced Directives  Does Patient Have a Medical Advance Directive? Yes  Type of Advance Directive [place]  Does patient want to make changes to medical advance directive? No - Patient declined  Copy of [place] in Chart? No - copy requested      Chief Complaint  Patient presents with   New Admit To SNF    Patient is here as a new admit to establish care    HPI: Patient is a 86 y.o. female seen today for admission to  Past Medical History:  Diagnosis Date   Allergy    Arthritis    s/p bilateral knees and left hip replacement   Asthma    CAD (coronary artery disease)    a. 12/1996 s/p CABG x4 [facility] [place]);  b. 2005 Pt reports stress test & cath, which revealed patent grafts.   Cancer [facility])    melanoma right arm   Carotid arterial disease ([place])    a. 04/2015 Carotid U/S: <50% bilat ICA stenosis.   Chronic kidney disease    Colon polyps    H/O   Depression    GERD (gastroesophageal reflux disease)    h/o hiatal hernia   Headache    migraines in past   Heart murmur    a. 04/2011 Echo: EF 55-60%, bilat atrial enlargement, mild to mod TR.   History of chicken pox    History of hiatal hernia    Hx of migraines    rare now   Hx: UTI (urinary tract infection)    Hyperlipidemia    a. Statin intolerant -->on zetia.   Hypertension    Hypertensive heart disease    Lichen planus    Melanoma (HCC)    Palpitations    a. rare PVC's and h/o SVT.    PMR (polymyalgia rheumatica) (HCC)    h/o in setting of crestor usage.   PSVT (paroxysmal supraventricular tachycardia) (HCC)    PUD (peptic ulcer disease)    remote history   Raynaud's phenomenon    Spinal stenosis    Urine incontinence    H/O   Vitamin D deficiency    Past Surgical History:  Procedure Laterality Date   ADENOIDECTOMY     age 36   BACK SURGERY     L3-L5   BILATERAL CARPAL TUNNEL RELEASE     BREAST BIOPSY Right    bx x 3 neg   BREAST SURGERY Right    biopsy x 3 (all benign)   CARDIAC CATHETERIZATION N/A 06/01/2016   Procedure: LEFT HEART CATH AND CORS/GRAFTS ANGIOGRAPHY;  Surgeon: Wellington Hampshire, MD;  Location: [place] [facility];  Service: Cardiovascular;  Laterality: N/A;   CARDIAC CATHETERIZATION N/A 06/01/2016   Procedure: Coronary Stent Intervention;  Surgeon: Wellington Hampshire, MD;  Location: [place] [facility];  Service: Cardiovascular;  Laterality: N/A;   CARDIOVERSION N/A 03/08/2017   Procedure: CARDIOVERSION;  Surgeon: Wellington Hampshire, MD;  Location: [facility] ORS;  Service: Cardiovascular;  Laterality: N/A;   CATARACT EXTRACTION W/PHACO Right 01/04/2016  Procedure: CATARACT EXTRACTION PHACO AND INTRAOCULAR LENS PLACEMENT (IOC);  Surgeon: Leandrew Koyanagi, MD;  Location: [place];  Service: Ophthalmology;  Laterality: Right;  MALYUGIN   CATARACT EXTRACTION W/PHACO Left 01/25/2016   Procedure: CATARACT EXTRACTION PHACO AND INTRAOCULAR LENS PLACEMENT ([place]) left eye;  Surgeon: Leandrew Koyanagi, MD;  Location: [place];  Service: Ophthalmology;  Laterality: Left;  MALYUGIN SHUGARCAINE   CHOLECYSTECTOMY  90's   COLONOSCOPY  03/19/2020   Procedure: COLONOSCOPY;  Surgeon: Virgel Manifold, MD;  Location: [facility] ENDOSCOPY;  Service: Gastroenterology;;   COLONOSCOPY WITH PROPOFOL N/A 05/03/2015   Procedure: COLONOSCOPY WITH PROPOFOL;  Surgeon: Lollie Sails, MD;  Location: [facility] ENDOSCOPY;  Service: Endoscopy;  Laterality: N/A;    COLONOSCOPY WITH PROPOFOL N/A 03/19/2020   Procedure: COLONOSCOPY WITH PROPOFOL;  Surgeon: Virgel Manifold, MD;  Location: [facility] ENDOSCOPY;  Service: Endoscopy;  Laterality: N/A;   CORONARY ARTERY BYPASS GRAFT  98   4 vessel   CORONARY ARTERY BYPASS GRAFT     x 4 age 36 per pt    CORONARY STENT INTERVENTION N/A 03/31/2018   Procedure: CORONARY STENT INTERVENTION;  Surgeon: Wellington Hampshire, MD;  Location: [place] [facility];  Service: Cardiovascular;  Laterality: N/A;   ESOPHAGOGASTRODUODENOSCOPY N/A 03/22/2015   Procedure: ESOPHAGOGASTRODUODENOSCOPY (EGD);  Surgeon: Lollie Sails, MD;  Location: [facility] ENDOSCOPY;  Service: Endoscopy;  Laterality: N/A;   ESOPHAGOGASTRODUODENOSCOPY N/A 03/19/2020   Procedure: ESOPHAGOGASTRODUODENOSCOPY (EGD);  Surgeon: Virgel Manifold, MD;  Location: [facility] ENDOSCOPY;  Service: Endoscopy;  Laterality: N/A;   HEMORRHOID BANDING     HEMORRHOID SURGERY N/A 02/17/2018   Procedure: HEMORRHOIDECTOMY;  Surgeon: Robert Bellow, MD;  Location: [facility];  Service: General;  Laterality: N/A;   JOINT REPLACEMENT     BILATERAL KNEE REPLACEMENTS   KNEE ARTHROSCOPY W/ OATS PROCEDURE     Lt knee (9/01), Rt knee (3/11), Lt hip (5/10)   LEFT HEART CATH AND CORONARY ANGIOGRAPHY N/A 03/31/2018   Procedure: LEFT HEART CATH AND CORONARY ANGIOGRAPHY;  Surgeon: Wellington Hampshire, MD;  Location: [place] [facility];  Service: Cardiovascular;  Laterality: N/A;   TOTAL HIP ARTHROPLASTY      reports that she has quit smoking. Her smoking use included cigarettes. She has never used smokeless tobacco. She reports that she does not drink alcohol and does not use drugs. 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.  Retired Engineer, drilling.  Relatively active.   Uses a Rollator to ambulate   Social Determinants of Health   Financial Resource Strain: Low Risk  (05/30/2022)   Overall Financial Resource Strain (CARDIA)    Difficulty of Paying Living Expenses: Not hard at all  Food Insecurity: No Food Insecurity (06/21/2022)   Hunger Vital Sign  Worried About Charity fundraiser in the Last Year: Never true    [place] in the Last Year: Never true  Transportation Needs: No Transportation Needs (06/21/2022)   PRAPARE - Hydrologist (Medical): No    Lack of Transportation (Non-Medical): No  Physical Activity: Sufficiently Active (05/30/2022)   Exercise Vital Sign    Days of Exercise per Week: 7 days    Minutes of Exercise per Session: 40 min  Stress: No Stress Concern Present (05/30/2022)   [place]    Feeling of Stress : Not at all  Social Connections: Unknown (05/30/2022)   Social Connection and Isolation Panel [NHANES]    Frequency of Communication with Friends and Family: More than three times a week    Frequency of Social Gatherings with Friends and Family: More than three times a week    Attends Religious Services: Not on file    Active Member of Clubs or Organizations: Not on file    Attends Archivist Meetings: Not on file    Marital Status: Not on file  Intimate Partner Violence: Not At Risk (05/30/2022)   Humiliation, Afraid, Rape, and Kick questionnaire    Fear of Current or Ex-Partner: No    Emotionally Abused: No    Physically Abused: No     Sexually Abused: No    Functional Status Survey:    Family History  Problem Relation Age of Onset   Thyroid disease Mother        graves disease   Heart disease Father        rheumatic heart   Alcohol abuse Father    Depression Father    Lung cancer Sister    Depression Daughter    Thyroid disease Daughter        hashimoto   Depression Son    Stroke Maternal Grandmother    Depression Maternal Grandmother    Hypertension Maternal Grandfather    Hypertension Paternal Grandfather    Seizures Son    Breast cancer Neg Hx     Health Maintenance  Topic Date Due   COVID-19 Vaccine (6 - Moderna risk series) 08/25/2021   INFLUENZA VACCINE  05/08/2022   Zoster Vaccines- Shingrix (1 of 2) 08/03/2022 (Originally 04/07/1955)   MAMMOGRAM  09/07/2022 (Originally 12/18/2019)   DEXA SCAN  09/07/2022 (Originally 04/08/2003)   TETANUS/TDAP  09/07/2022 (Originally 02/23/2020)   COLONOSCOPY (Pts 45-34yr Insurance coverage will need to be confirmed)  03/19/2025   Pneumonia Vaccine 86 Years old  Completed   HPV VACCINES  Aged Out    Allergies  Allergen Reactions   Beta Adrenergic Blockers Shortness Of Breath    DEPRESSION/HYPOTENSION   Brilinta [Ticagrelor] Shortness Of Breath    Caused AFIB   Albuterol     rigors   Antihistamines, Diphenhydramine-Type     Muscle spasms   Aspirin Other (See Comments)    Heartburn    Nsaids Other (See Comments)    ULCER HISTORY & CURRENTLY ON BLOOD THINNERS   Penicillins Hives and Other (See Comments)    Has patient had a PCN reaction causing immediate rash, facial/tongue/throat swelling, SOB or lightheadedness with hypotension: No Has patient had a PCN reaction causing severe rash involving mucus membranes or skin necrosis: No Has patient had a PCN reaction that required hospitalization No Has patient had a PCN reaction occurring within the last 10 years: No If all of the above answers  are "NO", then may proceed with Cephalosporin use.    Statins  Other (See Comments)    Muscle weakness   Sulfasalazine Rash    At mouth   Tetracycline Rash   Tetracyclines & Related Rash    Outpatient Encounter Medications as of 06/27/2022  Medication Sig   acetaminophen (TYLENOL) 500 MG tablet Take 1,000 mg by mouth 2 (two) times daily.   amitriptyline (ELAVIL) 25 MG tablet TAKE 1 TABLET BY MOUTH AT BEDTIME   ATROVENT HFA 17 MCG/ACT inhaler INHALE 2 PUFFS INTO THE LUNGS EVERY 6 HOURS AS NEEDED FOR WHEEZING   b complex vitamins tablet Take 1 tablet by mouth daily.   Cholecalciferol (VITAMIN D3) 2000 UNITS TABS Take 2,000 Units by mouth 2 (two) times a week. Tuesday and Saturdays.   Coenzyme Q10 (COQ-10) 200 MG CAPS Take 200 mg by mouth daily.    conjugated estrogens (PREMARIN) vaginal cream Apply 0.'5mg'$  (pea-sized amount)  just inside the vaginal introitus with a finger-tip on  Monday, Wednesday and Friday nights.   CRANBERRY PO Take 1 capsule by mouth in the morning, at noon, and at bedtime.   cyclobenzaprine (FLEXERIL) 5 MG tablet Take 1 tablet (5 mg total) by mouth 2 (two) times daily as needed for muscle spasms.   dexamethasone (DECADRON) 4 MG tablet Take 1 tablet (4 mg total) by mouth daily for 5 days.   ELIQUIS 5 MG TABS tablet TAKE 1 TABLET BY MOUTH TWICE DAILY   ezetimibe (ZETIA) 10 MG tablet TAKE 1 TABLET BY MOUTH DAILY   FEROSUL 325 (65 Fe) MG tablet TAKE ONE TABLET EVERY DAY   furosemide (LASIX) 40 MG tablet TAKE 1 TABLET ('40MG'$ ) IN THE MORNING AND TAKE 1/2 TABLET ('20MG'$ ) IN THE EVENING   hydrocortisone 2.5 % cream APPLY TO LICHEN PLANUS AS DIRECTED   ketoconazole (NIZORAL) 2 % cream Apply 1 application topically daily.   loratadine (CLARITIN) 10 MG tablet Take 10 mg by mouth daily.   meclizine (ANTIVERT) 25 MG tablet Take 1 tablet (25 mg total) by mouth 3 (three) times daily as needed for dizziness.   Multiple Vitamins-Minerals (HAIR/SKIN/NAILS PO) Take 1 tablet by mouth daily.   mupirocin ointment (BACTROBAN) 2 % Apply to affected area bid    pantoprazole (PROTONIX) 40 MG tablet TAKE 1 TABLET BY MOUTH TWICE DAILY   polyethylene glycol powder (GLYCOLAX/MIRALAX) 17 GM/SCOOP powder Take 8.5 g by mouth daily. Hold if diarrhea   ramipril (ALTACE) 5 MG capsule TAKE 1 CAPSULE BY MOUTH ONCE DAILY   REPATHA SURECLICK 371 MG/ML SOAJ INJECT 1 PEN EVERY 14 DAYS AS DIRECTED   spironolactone (ALDACTONE) 25 MG tablet Take 1 tablet (25 mg total) by mouth every morning.   traMADol (ULTRAM) 50 MG tablet TAKE ONE TABLET TWICE DAILY AS NEEDED   traZODone (DESYREL) 150 MG tablet Two tablets by mouth at bedtime as needed for sleep   triamcinolone cream (KENALOG) 0.1 % Apply topically 2 (two) times daily.   verapamil (CALAN-SR) 180 MG CR tablet TAKE ONE (1) TABLET BY MOUTH TWO TIMES PER DAY   vitamin B-12 (CYANOCOBALAMIN) 1000 MCG tablet Take 1,000 mcg by mouth daily.   vitamin C (ASCORBIC ACID) 500 MG tablet Take 500 mg by mouth at bedtime.   [DISCONTINUED] clotrimazole (MYCELEX) 10 MG troche Take 10 mg by mouth as needed. (Patient not taking: Reported on 06/21/2022)   No facility-administered encounter medications on file as of 06/27/2022.    Review of Systems  Vitals:   06/27/22 1111  BP:  122/75  Pulse: (!) 115  Resp: 18  Temp: (!) 97.5 F (36.4 C)  SpO2: 95%  Weight: 177 lb (80.3 kg)  Height: '5\' 2"'$  (1.575 m)   Body mass index is 32.37 kg/m. Physical Exam  Labs reviewed: Basic Metabolic Panel: Recent Labs    05/10/22 1532 06/20/22 1831 06/22/22 0534  NA 138 137 136  K 4.2 4.3 4.1  CL 100 101 104  CO2 '30 25 23  '$ GLUCOSE 147* 108* 146*  BUN 31* 26* 21  CREATININE 1.33* 1.21* 0.88  CALCIUM 10.3 9.7 9.8  MG  --   --  2.3   Liver Function Tests: Recent Labs    01/11/22 0000 05/10/22 1532 06/20/22 2136  AST '21 21 27  '$ ALT '20 18 21  '$ ALKPHOS 46 50 54  BILITOT  --  0.5 1.0  PROT  --  6.6 6.9  ALBUMIN 4.5 4.4 4.2   Recent Labs    06/20/22 2136  LIPASE 43   No results for input(s): "AMMONIA" in the last 8760  hours. CBC: Recent Labs    05/10/22 1532 06/20/22 1831 06/21/22 0835  WBC 5.5 6.5 6.4  NEUTROABS 3.9  --   --   HGB 13.0 12.2 12.9  HCT 38.7 37.8 38.8  MCV 103.3* 104.1* 101.0*  PLT 242.0 224 233   Cardiac Enzymes: No results for input(s): "CKTOTAL", "CKMB", "CKMBINDEX", "TROPONINI" in the last 8760 hours. BNP: Invalid input(s): "POCBNP" Lab Results  Component Value Date   HGBA1C 6.1 05/10/2022   Lab Results  Component Value Date   TSH 1.58 01/11/2022   Lab Results  Component Value Date   DUKGURKY70 623 03/29/2018   Lab Results  Component Value Date   FOLATE 39.0 03/29/2018   Lab Results  Component Value Date   IRON 28 03/02/2021   TIBC 459 03/02/2021   FERRITIN 29.5 06/22/2021    Imaging and Procedures obtained prior to SNF admission: MR LUMBAR SPINE WO CONTRAST  Result Date: 06/20/2022 CLINICAL DATA:  SPINAL STENOSIS.  LEFT-SIDED WEAKNESS.  RECENT FALL. EXAM: MRI LUMBAR SPINE WITHOUT CONTRAST TECHNIQUE: Multiplanar, multisequence MR imaging of the lumbar spine was performed. No intravenous contrast was administered. COMPARISON:  03/25/2017 FINDINGS: Segmentation:  Standard. Alignment: Grade 1 retrolisthesis at L1-2 and L2-3. Grade 1 anterolisthesis at L4-5. Vertebrae: Posterior fusion instrumentation extends from L3-L5 with screws at the L3 and L5 levels. There is no acute fracture. The L4 level is posteriorly decompressed. Conus medullaris and cauda equina: Conus extends to the L1 level. Conus and cauda equina appear normal. Paraspinal and other soft tissues: Negative Disc levels: T12-L1: Unchanged small disc bulge without spinal canal stenosis. L1-L2: Mild worsening disc bulge with endplate osteophytes. Progression of severe spinal canal stenosis. No neural foraminal stenosis. L2-L3: Unchanged disc bulge with endplate spurring. Unchanged severe spinal canal stenosis. Unchanged severe right and moderate left neural foraminal stenosis. L3-L4: Interbody fusion with  posterior instrumentation connecting L3 to L5. No spinal canal stenosis. No neural foraminal stenosis. L4-L5: Interbody fusion with posterior instrumentation connecting L5 to L3. No spinal canal stenosis. No neural foraminal stenosis. L5-S1: Unchanged disc bulge. Unchanged moderate spinal canal stenosis. Unchanged severe left neural foraminal stenosis. Visualized sacrum: Normal. IMPRESSION: 1. Progression of severe spinal canal stenosis at L1-L2. 2. Unchanged severe spinal canal stenosis and severe right, moderate left neural foraminal stenosis at L2-L3. 3. Unchanged moderate L5-S1 spinal canal stenosis and severe left neural foraminal stenosis. 4. Posterior fusion instrumentation from L3-L5 without residual spinal canal stenosis. Electronically Signed  By: Ulyses Jarred M.D.   On: 06/20/2022 22:41   DG Chest Port 1 View  Result Date: 06/20/2022 CLINICAL DATA:  Increased weakness; questionable sepsis EXAM: PORTABLE CHEST 1 VIEW COMPARISON:  Radiographs 03/25/2018 FINDINGS: Stable cardiomegaly. Sternotomy and CABG. Aortic atherosclerotic calcification. Elevated left hemidiaphragm and left basilar atelectasis. No focal consolidation, pleural effusion, or pneumothorax. No acute osseous abnormality. IMPRESSION: No change from 03/25/2018. Stable cardiomegaly and aortic atherosclerosis. Elevated left hemidiaphragm with left basilar atelectasis. Electronically Signed   By: Placido Sou M.D.   On: 06/20/2022 21:38   CT Head Wo Contrast  Result Date: 06/20/2022 CLINICAL DATA:  Head trauma, minor (Age >= 65y) EXAM: CT HEAD WITHOUT CONTRAST TECHNIQUE: Contiguous axial images were obtained from the base of the skull through the vertex without intravenous contrast. RADIATION DOSE REDUCTION: This exam was performed according to the departmental dose-optimization program which includes automated exposure control, adjustment of the mA and/or kV according to patient size and/or use of iterative reconstruction technique.  COMPARISON:  06/13/2022 FINDINGS: Brain: No acute intracranial abnormality. Specifically, no hemorrhage, hydrocephalus, mass lesion, acute infarction, or significant intracranial injury. Age related volume loss. Vascular: No hyperdense vessel or unexpected calcification. Skull: No acute calvarial abnormality. Sinuses/Orbits: Clear Other: None IMPRESSION: No acute intracranial abnormality. Electronically Signed   By: Rolm Baptise M.D.   On: 06/20/2022 21:27    Assessment/Plan There are no diagnoses linked to this encounter.   Family/ staff Communication:   Labs/tests ordered:

## 2022-06-27 NOTE — Progress Notes (Signed)
Provider:   Location:  [facility] Room Number: NO/103/A Place of Service:  SNF (31)  PCP: Dewayne Shorter, MD Patient Care Team: Dewayne Shorter, MD as PCP - General (Family Medicine) Wende Bushy, MD as Consulting Physician (Cardiology) Clent Jacks, RN as Registered Nurse Rockey Situ Kathlene November, MD as Consulting Physician (Cardiology)  Extended Emergency Contact Information Primary Emergency Contact: [place] Phone: [phone] Relation: Daughter Secondary Emergency Contact: Doroteo Glassman [place] Phone: [phone] Relation: Son  Code Status: DNAR Goals of Care: Advanced Directive information    06/27/2022   11:15 AM  Advanced Directives  Does Patient Have a Medical Advance Directive? Yes  Type of Advance Directive [place]  Does patient want to make changes to medical advance directive? No - Patient declined  Copy of [place] in Chart? No - copy requested      Chief Complaint  Patient presents with   New Admit To SNF    Patient is here as a new admit to establish care    HPI: Patient is a 86 y.o. female seen today for admission to [facility]. She was in [employer] for the last 2 years or so she only needed help with meds and ted hose  2 weeks ago she fell for the first time. She was off balance holding the recycling bag. She fell on her left hip. She has excruciating pain in her left leg. She has had improved pain since then. She went to respite for 2 days and was back by herself in her apartment in [facility]. She has been a bit confused which is new for her. She has been getting weaker and weaker in the legs and she called to the ED. She couldn't even walk with her rollator. Because of the fall and the pain she was put on dexamethasone she doesn't tolerate it well. She also stopped taking the flexeril and didn't get any better.  Denies chest pain, paliptations or SOB. She has controlled AFIB.   She takes 1-2 tramadol at nigth for pain. She finally slep t at 1 am and she woke up at 6 am and was comfortable. She isn't sure if therapy is going. She does sudoku and word searches. This morning she was not clear on how the television was working. She spells WORLD   She has rectal prolapse-- and she has to push the stool back in. If she doesn't have the urge to defecate she will stuff tissue back there and it pushes the stool back in. Everything in her bathroom  Her legs are aching. She has spinal stenosis - she can't straighten up. She was 5'8 and is now 5'2. She feels like her recliner would be helpful.   She is rollator dependent -- 1-2 steps only.   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  She has a daughter Community education officer in [place]- attempted to call to discuss update.   Past Medical History:  Diagnosis Date   Allergy    Arthritis    s/p bilateral knees and left hip replacement   Asthma    CAD (coronary artery disease)    a. 12/1996 s/p CABG x4 [facility] [place]);  b. 2005 Pt reports stress test & cath, which revealed patent grafts.   Cancer [facility])    melanoma right arm   Carotid arterial disease ([place])    a. 04/2015 Carotid U/S: <  50% bilat ICA stenosis.   Chronic kidney disease    Colon polyps    H/O   Depression    GERD (gastroesophageal reflux disease)    h/o hiatal hernia   Headache    migraines in past   Heart murmur    a. 04/2011 Echo: EF 55-60%, bilat atrial enlargement, mild to mod TR.   History of chicken pox    History of hiatal hernia    Hx of migraines    rare now   Hx: UTI (urinary tract infection)    Hyperlipidemia    a. Statin intolerant -->on zetia.   Hypertension    Hypertensive heart disease    Lichen planus    Melanoma (HCC)    Palpitations    a. rare PVC's and h/o SVT.   PMR (polymyalgia rheumatica) (HCC)    h/o in setting of crestor usage.   PSVT  (paroxysmal supraventricular tachycardia) (HCC)    PUD (peptic ulcer disease)    remote history   Raynaud's phenomenon    Spinal stenosis    Urine incontinence    H/O   Vitamin D deficiency    Past Surgical History:  Procedure Laterality Date   ADENOIDECTOMY     age 64   BACK SURGERY     L3-L5   BILATERAL CARPAL TUNNEL RELEASE     BREAST BIOPSY Right    bx x 3 neg   BREAST SURGERY Right    biopsy x 3 (all benign)   CARDIAC CATHETERIZATION N/A 06/01/2016   Procedure: LEFT HEART CATH AND CORS/GRAFTS ANGIOGRAPHY;  Surgeon: Wellington Hampshire, MD;  Location: [place] [facility];  Service: Cardiovascular;  Laterality: N/A;   CARDIAC CATHETERIZATION N/A 06/01/2016   Procedure: Coronary Stent Intervention;  Surgeon: Wellington Hampshire, MD;  Location: [place] [facility];  Service: Cardiovascular;  Laterality: N/A;   CARDIOVERSION N/A 03/08/2017   Procedure: CARDIOVERSION;  Surgeon: Wellington Hampshire, MD;  Location: [facility] ORS;  Service: Cardiovascular;  Laterality: N/A;   CATARACT EXTRACTION W/PHACO Right 01/04/2016   Procedure: CATARACT EXTRACTION PHACO AND INTRAOCULAR LENS PLACEMENT ([place]);  Surgeon: Leandrew Koyanagi, MD;  Location: [place];  Service: Ophthalmology;  Laterality: Right;  MALYUGIN   CATARACT EXTRACTION W/PHACO Left 01/25/2016   Procedure: CATARACT EXTRACTION PHACO AND INTRAOCULAR LENS PLACEMENT ([place]) left eye;  Surgeon: Leandrew Koyanagi, MD;  Location: [place];  Service: Ophthalmology;  Laterality: Left;  MALYUGIN SHUGARCAINE   CHOLECYSTECTOMY  90's   COLONOSCOPY  03/19/2020   Procedure: COLONOSCOPY;  Surgeon: Virgel Manifold, MD;  Location: [facility] ENDOSCOPY;  Service: Gastroenterology;;   COLONOSCOPY WITH PROPOFOL N/A 05/03/2015   Procedure: COLONOSCOPY WITH PROPOFOL;  Surgeon: Lollie Sails, MD;  Location: [facility] ENDOSCOPY;  Service: Endoscopy;  Laterality: N/A;   COLONOSCOPY WITH PROPOFOL N/A 03/19/2020   Procedure: COLONOSCOPY WITH PROPOFOL;   Surgeon: Virgel Manifold, MD;  Location: [facility] ENDOSCOPY;  Service: Endoscopy;  Laterality: N/A;   CORONARY ARTERY BYPASS GRAFT  98   4 vessel   CORONARY ARTERY BYPASS GRAFT     x 4 age 39 per pt    CORONARY STENT INTERVENTION N/A 03/31/2018   Procedure: CORONARY STENT INTERVENTION;  Surgeon: Wellington Hampshire, MD;  Location: [place] [facility];  Service: Cardiovascular;  Laterality: N/A;   ESOPHAGOGASTRODUODENOSCOPY N/A 03/22/2015   Procedure: ESOPHAGOGASTRODUODENOSCOPY (EGD);  Surgeon: Lollie Sails, MD;  Location: [facility] ENDOSCOPY;  Service: Endoscopy;  Laterality: N/A;   ESOPHAGOGASTRODUODENOSCOPY N/A 03/19/2020   Procedure: ESOPHAGOGASTRODUODENOSCOPY (  EGD);  Surgeon: Virgel Manifold, MD;  Location: [facility] ENDOSCOPY;  Service: Endoscopy;  Laterality: N/A;   HEMORRHOID BANDING     HEMORRHOID SURGERY N/A 02/17/2018   Procedure: HEMORRHOIDECTOMY;  Surgeon: Robert Bellow, MD;  Location: [facility] ORS;  Service: General;  Laterality: N/A;   JOINT REPLACEMENT     BILATERAL KNEE REPLACEMENTS   KNEE ARTHROSCOPY W/ OATS PROCEDURE     Lt knee (9/01), Rt knee (3/11), Lt hip (5/10)   LEFT HEART CATH AND CORONARY ANGIOGRAPHY N/A 03/31/2018   Procedure: LEFT HEART CATH AND CORONARY ANGIOGRAPHY;  Surgeon: Wellington Hampshire, MD;  Location: [place] [facility];  Service: Cardiovascular;  Laterality: N/A;   TOTAL HIP ARTHROPLASTY      reports that she has quit smoking. Her smoking use included cigarettes. She has never used smokeless tobacco. She reports that she does not drink alcohol and does not use drugs. 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  Retired Engineer, drilling.  Relatively active.   Uses a Rollator to ambulate   Social Determinants of Health   Financial Resource Strain: Low Risk  (05/30/2022)   Overall Financial Resource Strain (CARDIA)    Difficulty of Paying Living Expenses: Not hard at all  Food Insecurity: No Food Insecurity (06/21/2022)   Hunger Vital Sign    Worried About Running Out of Food in the Last Year: Never true    Ran Out of Food in the Last Year: Never true  Transportation Needs: No Transportation Needs (06/21/2022)   PRAPARE - Hydrologist (Medical): No    Lack of Transportation (Non-Medical): No  Physical Activity: Sufficiently Active (05/30/2022)   Exercise Vital Sign    Days of Exercise per Week: 7 days    Minutes of Exercise per Session: 40 min  Stress: No Stress Concern Present (05/30/2022)   [place]    Feeling of Stress : Not at all  Social Connections: Unknown (05/30/2022)   Social Connection and Isolation Panel [NHANES]    Frequency of Communication with Friends and Family: More than three times a week    Frequency of Social Gatherings with Friends and Family: More than three times a week    Attends Religious Services: Not on file    Active Member of Clubs or Organizations: Not on file    Attends Archivist Meetings: Not on file    Marital Status: Not on file  Intimate Partner Violence: Not At Risk (05/30/2022)   Humiliation, Afraid, Rape, and Kick questionnaire    Fear of Current or Ex-Partner: No    Emotionally Abused: No    Physically Abused: No    Sexually Abused: No  Functional Status Survey:    Family History   Problem Relation Age of Onset   Thyroid disease Mother        graves disease   Heart disease Father        rheumatic heart   Alcohol abuse Father    Depression Father    Lung cancer Sister    Depression Daughter    Thyroid disease Daughter        hashimoto   Depression Son    Stroke Maternal Grandmother    Depression Maternal Grandmother    Hypertension Maternal Grandfather    Hypertension Paternal Grandfather    Seizures Son    Breast cancer Neg Hx     Health Maintenance  Topic Date Due   COVID-19 Vaccine (6 - Moderna risk series) 08/25/2021   INFLUENZA VACCINE  05/08/2022   Zoster Vaccines- Shingrix (1 of 2) 08/03/2022 (Originally 04/07/1955)   MAMMOGRAM  09/07/2022 (Originally 12/18/2019)   DEXA SCAN  09/07/2022 (Originally 04/08/2003)   TETANUS/TDAP  09/07/2022 (Originally 02/23/2020)   COLONOSCOPY (Pts 45-80yr Insurance coverage will need to be confirmed)  03/19/2025   Pneumonia Vaccine 86 Years old  Completed   HPV VACCINES  Aged Out    Allergies  Allergen Reactions   Beta Adrenergic Blockers Shortness Of Breath    DEPRESSION/HYPOTENSION   Brilinta [Ticagrelor] Shortness Of Breath    Caused AFIB   Albuterol     rigors   Antihistamines, Diphenhydramine-Type     Muscle spasms   Aspirin Other (See Comments)    Heartburn    Nsaids Other (See Comments)    ULCER HISTORY & CURRENTLY ON BLOOD THINNERS   Penicillins Hives and Other (See Comments)    Has patient had a PCN reaction causing immediate rash, facial/tongue/throat swelling, SOB or lightheadedness with hypotension: No Has patient had a PCN reaction causing severe rash involving mucus membranes or skin necrosis: No Has patient had a PCN reaction that required hospitalization No Has patient had a PCN reaction occurring within the last 10 years: No If all of the above answers are "NO", then may proceed with Cephalosporin use.    Statins Other (See Comments)    Muscle weakness   Sulfasalazine Rash    At mouth    Tetracycline Rash   Tetracyclines & Related Rash    Outpatient Encounter Medications as of 06/27/2022  Medication Sig   acetaminophen (TYLENOL) 500 MG tablet Take 1,000 mg by mouth 2 (two) times daily.   amitriptyline (ELAVIL) 25 MG tablet TAKE 1 TABLET BY MOUTH AT BEDTIME   ATROVENT HFA 17 MCG/ACT inhaler INHALE 2 PUFFS INTO THE LUNGS EVERY 6 HOURS AS NEEDED FOR WHEEZING   b complex vitamins tablet Take 1 tablet by mouth daily.   Cholecalciferol (VITAMIN D3) 2000 UNITS TABS Take 1,000 Units by mouth 2 (two) times a week. Tuesday and Saturdays.   Coenzyme Q10 (COQ-10) 200 MG CAPS Take 200 mg by mouth daily.    conjugated estrogens (PREMARIN) vaginal cream Apply 0.'5mg'$  (pea-sized amount)  just inside the vaginal introitus with a finger-tip on  Monday, Wednesday and Friday nights.   CRANBERRY PO Take 1 capsule by mouth in the morning, at noon, and at bedtime.   ELIQUIS 5 MG TABS tablet TAKE 1 TABLET BY MOUTH TWICE DAILY   ezetimibe (ZETIA) 10 MG tablet TAKE 1 TABLET BY MOUTH DAILY   FEROSUL 325 (65 Fe) MG tablet TAKE ONE TABLET EVERY DAY   furosemide (LASIX) 40 MG tablet TAKE 1 TABLET (  $'40MG'S$ ) IN THE MORNING AND TAKE 1/2 TABLET ('20MG'$ ) IN THE EVENING   hydrocortisone 2.5 % cream APPLY TO LICHEN PLANUS AS DIRECTED   ketoconazole (NIZORAL) 2 % cream Apply 1 application topically daily.   loratadine (CLARITIN) 10 MG tablet Take 10 mg by mouth daily.   Multiple Vitamins-Minerals (HAIR/SKIN/NAILS PO) Take 1 tablet by mouth daily.   pantoprazole (PROTONIX) 40 MG tablet TAKE 1 TABLET BY MOUTH TWICE DAILY   polyethylene glycol powder (GLYCOLAX/MIRALAX) 17 GM/SCOOP powder Take 8.5 g by mouth daily. Hold if diarrhea   ramipril (ALTACE) 5 MG capsule TAKE 1 CAPSULE BY MOUTH ONCE DAILY   REPATHA SURECLICK 338 MG/ML SOAJ INJECT 1 PEN EVERY 14 DAYS AS DIRECTED   spironolactone (ALDACTONE) 25 MG tablet Take 1 tablet (25 mg total) by mouth every morning.   traMADol (ULTRAM) 50 MG tablet TAKE ONE TABLET TWICE  DAILY AS NEEDED   traZODone (DESYREL) 150 MG tablet Two tablets by mouth at bedtime as needed for sleep   triamcinolone cream (KENALOG) 0.1 % Apply topically 2 (two) times daily.   verapamil (CALAN-SR) 180 MG CR tablet TAKE ONE (1) TABLET BY MOUTH TWO TIMES PER DAY   vitamin B-12 (CYANOCOBALAMIN) 1000 MCG tablet Take 1,000 mcg by mouth daily.   vitamin C (ASCORBIC ACID) 500 MG tablet Take 500 mg by mouth at bedtime.   [DISCONTINUED] clotrimazole (MYCELEX) 10 MG troche Take 10 mg by mouth as needed.   [DISCONTINUED] cyclobenzaprine (FLEXERIL) 5 MG tablet Take 1 tablet (5 mg total) by mouth 2 (two) times daily as needed for muscle spasms.   [DISCONTINUED] dexamethasone (DECADRON) 4 MG tablet Take 1 tablet (4 mg total) by mouth daily for 5 days.   [DISCONTINUED] meclizine (ANTIVERT) 25 MG tablet Take 1 tablet (25 mg total) by mouth 3 (three) times daily as needed for dizziness.   [DISCONTINUED] mupirocin ointment (BACTROBAN) 2 % Apply to affected area bid   [DISCONTINUED] traMADol (ULTRAM) 50 MG tablet TAKE ONE TABLET TWICE DAILY AS NEEDED   No facility-administered encounter medications on file as of 06/27/2022.    Review of Systems  Constitutional:  Negative for activity change and appetite change.  HENT:         Vocal cord paralysis  Respiratory:  Negative for cough, choking, chest tightness and shortness of breath.   Cardiovascular:  Negative for chest pain and leg swelling.  Gastrointestinal:  Negative for abdominal distention and abdominal pain.  Genitourinary:  Negative for difficulty urinating.  Neurological:  Negative for dizziness.       She has felt unsteady on her feet.    Vitals:   06/27/22 1111  BP: 122/75  Pulse: (!) 115  Resp: 18  Temp: (!) 97.5 F (36.4 C)  SpO2: 95%  Weight: 177 lb (80.3 kg)  Height: '5\' 2"'$  (1.575 m)   Body mass index is 32.37 kg/m. Physical Exam Constitutional:      Comments: Sitting in recliner chair.   Cardiovascular:     Rate and Rhythm:  Normal rate. Rhythm irregular.     Pulses: Normal pulses.  Pulmonary:     Effort: Pulmonary effort is normal.  Musculoskeletal:     Comments: 5/5 strength with extension and flexion of the knee. 3/5 with dorsiflexion of left foot. 5/5 dorsiflexion and flexion of the right foot. Bilateral upper extremities with 5/5 biceps, triceps, and grip strength.   Neurological:     Mental Status: She is alert and oriented to person, place, and time.  Comments: Alert and oriented x4. Able to spell WORLD backwards, days of the year backwards, serial numbers backwards - except slight challenge with serial numbers 4. She endorses some confusion earlier this morning, thought it was due to dexamethasone.    Labs reviewed: Basic Metabolic Panel: Recent Labs    05/10/22 1532 06/20/22 1831 06/22/22 0534  NA 138 137 136  K 4.2 4.3 4.1  CL 100 101 104  CO2 '30 25 23  '$ GLUCOSE 147* 108* 146*  BUN 31* 26* 21  CREATININE 1.33* 1.21* 0.88  CALCIUM 10.3 9.7 9.8  MG  --   --  2.3   Liver Function Tests: Recent Labs    01/11/22 0000 05/10/22 1532 06/20/22 2136  AST '21 21 27  '$ ALT '20 18 21  '$ ALKPHOS 46 50 54  BILITOT  --  0.5 1.0  PROT  --  6.6 6.9  ALBUMIN 4.5 4.4 4.2   Recent Labs    06/20/22 2136  LIPASE 43   No results for input(s): "AMMONIA" in the last 8760 hours. CBC: Recent Labs    05/10/22 1532 06/20/22 1831 06/21/22 0835  WBC 5.5 6.5 6.4  NEUTROABS 3.9  --   --   HGB 13.0 12.2 12.9  HCT 38.7 37.8 38.8  MCV 103.3* 104.1* 101.0*  PLT 242.0 224 233   Cardiac Enzymes: No results for input(s): "CKTOTAL", "CKMB", "CKMBINDEX", "TROPONINI" in the last 8760 hours. BNP: Invalid input(s): "POCBNP" Lab Results  Component Value Date   HGBA1C 6.1 05/10/2022   Lab Results  Component Value Date   TSH 1.58 01/11/2022   Lab Results  Component Value Date   ZOXWRUEA54 098 03/29/2018   Lab Results  Component Value Date   FOLATE 39.0 03/29/2018   Lab Results  Component Value Date    IRON 28 03/02/2021   TIBC 459 03/02/2021   FERRITIN 29.5 06/22/2021    Imaging and Procedures obtained prior to SNF admission: MR LUMBAR SPINE WO CONTRAST  Result Date: 06/20/2022 CLINICAL DATA:  SPINAL STENOSIS.  LEFT-SIDED WEAKNESS.  RECENT FALL. EXAM: MRI LUMBAR SPINE WITHOUT CONTRAST TECHNIQUE: Multiplanar, multisequence MR imaging of the lumbar spine was performed. No intravenous contrast was administered. COMPARISON:  03/25/2017 FINDINGS: Segmentation:  Standard. Alignment: Grade 1 retrolisthesis at L1-2 and L2-3. Grade 1 anterolisthesis at L4-5. Vertebrae: Posterior fusion instrumentation extends from L3-L5 with screws at the L3 and L5 levels. There is no acute fracture. The L4 level is posteriorly decompressed. Conus medullaris and cauda equina: Conus extends to the L1 level. Conus and cauda equina appear normal. Paraspinal and other soft tissues: Negative Disc levels: T12-L1: Unchanged small disc bulge without spinal canal stenosis. L1-L2: Mild worsening disc bulge with endplate osteophytes. Progression of severe spinal canal stenosis. No neural foraminal stenosis. L2-L3: Unchanged disc bulge with endplate spurring. Unchanged severe spinal canal stenosis. Unchanged severe right and moderate left neural foraminal stenosis. L3-L4: Interbody fusion with posterior instrumentation connecting L3 to L5. No spinal canal stenosis. No neural foraminal stenosis. L4-L5: Interbody fusion with posterior instrumentation connecting L5 to L3. No spinal canal stenosis. No neural foraminal stenosis. L5-S1: Unchanged disc bulge. Unchanged moderate spinal canal stenosis. Unchanged severe left neural foraminal stenosis. Visualized sacrum: Normal. IMPRESSION: 1. Progression of severe spinal canal stenosis at L1-L2. 2. Unchanged severe spinal canal stenosis and severe right, moderate left neural foraminal stenosis at L2-L3. 3. Unchanged moderate L5-S1 spinal canal stenosis and severe left neural foraminal stenosis. 4.  Posterior fusion instrumentation from L3-L5 without residual spinal canal stenosis. Electronically  Signed   By: Ulyses Jarred M.D.   On: 06/20/2022 22:41   DG Chest Port 1 View  Result Date: 06/20/2022 CLINICAL DATA:  Increased weakness; questionable sepsis EXAM: PORTABLE CHEST 1 VIEW COMPARISON:  Radiographs 03/25/2018 FINDINGS: Stable cardiomegaly. Sternotomy and CABG. Aortic atherosclerotic calcification. Elevated left hemidiaphragm and left basilar atelectasis. No focal consolidation, pleural effusion, or pneumothorax. No acute osseous abnormality. IMPRESSION: No change from 03/25/2018. Stable cardiomegaly and aortic atherosclerosis. Elevated left hemidiaphragm with left basilar atelectasis. Electronically Signed   By: Placido Sou M.D.   On: 06/20/2022 21:38   CT Head Wo Contrast  Result Date: 06/20/2022 CLINICAL DATA:  Head trauma, minor (Age >= 65y) EXAM: CT HEAD WITHOUT CONTRAST TECHNIQUE: Contiguous axial images were obtained from the base of the skull through the vertex without intravenous contrast. RADIATION DOSE REDUCTION: This exam was performed according to the departmental dose-optimization program which includes automated exposure control, adjustment of the mA and/or kV according to patient size and/or use of iterative reconstruction technique. COMPARISON:  06/13/2022 FINDINGS: Brain: No acute intracranial abnormality. Specifically, no hemorrhage, hydrocephalus, mass lesion, acute infarction, or significant intracranial injury. Age related volume loss. Vascular: No hyperdense vessel or unexpected calcification. Skull: No acute calvarial abnormality. Sinuses/Orbits: Clear Other: None IMPRESSION: No acute intracranial abnormality. Electronically Signed   By: Rolm Baptise M.D.   On: 06/20/2022 21:27    Assessment/Plan 1. Other osteoarthritis of spine, cervical region Patient with history of osteoarthritis of the spine notable spinal stenosis.  She has a history of claudication unclear if  this was a cause for her fall over a week ago.  Plan to continue tramadol 50 mg 1 to 2 tablets nightly as needed.  2. Acute on chronic diastolic CHF (congestive heart failure) ([place]) Patient symptoms well controlled on current regimen.  Continue furosemide 40 mg daily.  Patient appears euvolemic on exam no increased swelling or oxygen requirement.  Continue to monitor for symptoms  3. Benign essential hypertension Blood pressure well controlled on current regimen with ramipril daily.  4. Hiatal hernia Patient has history of hiatal hernia and acid reflux.  Symptoms well controlled with pantoprazole 40 mg twice daily.  Patient not interested in dose reduction.  5. Lichen planus Patient with history of lichen planus with ongoing symptoms.  She has a regimen with steroid cream when needed.  Periodic yeast infection, ketoconazole 2% available.  6. Hypercholesterolemia Patient with history of hypercholesterolemia and hyperlipidemia.  Per goals of care patient was discontinued on her statin therapy.  Patient takes numerous heart healthy vitamins including coenzyme 10,  7. Major depressive disorder in partial remission, unspecified whether recurrent [facility]) Patient with longstanding history of depression.  She states the only medication that has helped with her mood has been Elavil 25 mg for many years.  Discussed concern for this medication and contribution to any periodic confusion or increased fall risk, however patient states given good control of symptoms will defer making changes at this time.  8. Frequent UTI Patient without urinary tract infection symptoms at this time however given history patient continues to take frequent cranberry capsule supplementations.  Continue ACHS dosing.  9. Spinal stenosis of lumbar region with neurogenic claudication Patient continues to have pain worsening at night due to spinal stenosis we will continue tramadol 50 mg q. nightly.  10. Other migraine with status  migrainosus, not intractable Patient was recently started on Repatha 25 mg injection every 14 days for headaches.  She no longer is complaining of certain current  symptoms her next injection is scheduled for 9/25 nursing is aware.  Patient self administers medication.  We will continue to monitor for symptoms.  11. Hemorrhoids, internal Patient with history of hemorrhoids no current symptoms at this time.  Has hydrocortisone therapy available if needed.  Patient also has rectal prolapse which poses difficulty for adequate bowel movements without stool softener.  We will continue MiraLAX 17 g daily.  12. Persistent atrial fibrillation (HCC) Heart rate well controlled on verapamil 180 mg daily.  We will continue Eliquis 5 mg daily.  Patient not complaining of any symptoms of palpitations shortness of breath or lightheadedness.  13. Chronic diastolic heart failure ([place]) Patient with history of heart failure however appears euvolemic on exam.  We will continue daily dosing of Lasix 40 mg.  14. Rectal prolapse Patient with history of rectal prolapse with chronic irritation and difficulty with stool evacuation.  We will continue daily MiraLAX and steroid cream with hydrocortisone 2.5%.  15. Unsteadiness Patient's primary reason for admission to [facility] is for continued therapy.  She spent a few days in respite from assisted living facility and is continued to have difficulties with independent self-care.  Discussed with patient her goal is to return to assisted living when possible, however if she is recommended to stay here per physical therapy she is okay with this and understands this could eventually become her home.  Continue physical therapy.  Continue discussions regarding medications that could contribute to unsteadiness.  Patient discontinued dexamethasone and Flexeril per her symptoms agree with this change.  We will plan to discontinue Flexeril and dexamethasone continue to  monitor symptoms.  106. Fall at home, initial encounter Outlined above under unsteadiness, patient will continue physical therapy here in the health care center.  17. Chronic anticoagulation Patient is on Eliquis 5 mg twice daily for underlying diagnosis of atrial fibrillation.  Rate controlled with verapamil.  No signs of bleeding at this time we will continue to monitor.    Family/ staff Communication: Attempted to call daughter. Nursing staff updated of medication list.   Labs/tests ordered: BMP in 1 week.    Tomasa Rand, MD, [facility] [phone]

## 2022-06-28 ENCOUNTER — Encounter: Payer: Self-pay | Admitting: Student

## 2022-07-04 ENCOUNTER — Telehealth: Payer: Self-pay

## 2022-07-04 ENCOUNTER — Other Ambulatory Visit: Payer: Self-pay | Admitting: *Deleted

## 2022-07-04 NOTE — Patient Outreach (Signed)
[place] Coordinator follow up. Ms. Villalona resides in [facility].  Ms. Sigala utilized [facility] 3-day SNF waiver.   Update received from [facility] Admissions Director, reporting Ms. Kamphuis is progressing with therapy. Continues to need assistance with transfers, toileting, and getting in and out of bed. Goal is to return to ALF if possible.   Will continue to follow.    Marthenia Rolling, MSN, RN,BSN [place] [facility] Coordinator [phone] (Direct dial)

## 2022-07-04 NOTE — Telephone Encounter (Signed)
LM for Paulla Dolly, RN to call back in regards to patient to see if Dr. Nicki Reaper needed to sign form for patient to receive flu vaccine on 10/18. Pt has been following with physician with skilled nursing.

## 2022-07-06 ENCOUNTER — Other Ambulatory Visit: Payer: Self-pay | Admitting: Cardiovascular Disease

## 2022-07-06 NOTE — Telephone Encounter (Signed)
Hi,  Could you schedule a 6 month follow up visit for the patient? The patient was last seen by Dr. Rockey Situ 11/06/21. Thank you so much.

## 2022-07-09 ENCOUNTER — Telehealth: Payer: Self-pay | Admitting: Cardiovascular Disease

## 2022-07-09 NOTE — Telephone Encounter (Signed)
LVM to schedule fu appt, please schedule 

## 2022-07-10 ENCOUNTER — Ambulatory Visit: Payer: Medicare Other | Admitting: Cardiovascular Disease

## 2022-07-11 NOTE — Telephone Encounter (Signed)
Refill sent to pharmacy.   

## 2022-07-12 ENCOUNTER — Other Ambulatory Visit: Payer: Self-pay | Admitting: *Deleted

## 2022-07-12 NOTE — Patient Outreach (Signed)
[place] Coordinator follow up. Mrs. Lucado resides in [place].   Secure communication sent to Seth Bake, Admissions Coordinator to request update.   Will continue to follow.   Marthenia Rolling, MSN, RN,BSN [place] [facility] Coordinator [phone] (Direct dial)

## 2022-07-13 ENCOUNTER — Encounter: Payer: Self-pay | Admitting: Student

## 2022-07-13 ENCOUNTER — Non-Acute Institutional Stay (SKILLED_NURSING_FACILITY): Payer: Medicare Other | Admitting: Student

## 2022-07-13 DIAGNOSIS — Z66 Do not resuscitate: Secondary | ICD-10-CM

## 2022-07-13 DIAGNOSIS — K623 Rectal prolapse: Secondary | ICD-10-CM | POA: Diagnosis not present

## 2022-07-13 DIAGNOSIS — F419 Anxiety disorder, unspecified: Secondary | ICD-10-CM | POA: Diagnosis not present

## 2022-07-13 NOTE — Progress Notes (Addendum)
Location:  [facility] Room Number: 103-A Place of Service:  SNF [phone]) Provider:  Dewayne Shorter, MD  Patient Care Team: Dewayne Shorter, MD as PCP - General (Family Medicine) Wende Bushy, MD as Consulting Physician (Cardiology) Clent Jacks, RN as Registered Nurse Rockey Situ Kathlene November, MD as Consulting Physician (Cardiology)  Extended Emergency Contact Information Primary Emergency Contact: [place] Phone: [phone] Relation: Daughter Secondary Emergency Contact: Doroteo Glassman [place] Phone: [phone] Relation: Son  Code Status:  DNR Goals of care: Advanced Directive information    07/13/2022    2:08 PM  Advanced Directives  Does Patient Have a Medical Advance Directive? Yes  Type of Advance Directive Out of facility DNR (pink MOST or yellow form)  Does patient want to make changes to medical advance directive? No - Patient declined  Pre-existing out of facility DNR order (yellow form or pink MOST form) Yellow form placed in chart (order not valid for inpatient use)     Chief Complaint  Patient presents with   Acute Visit    Rectal prolapse     HPI:  Pt is a 86 y.o. female seen today for an acute visit for rectal prolapse. Pt has had this for many years. Prevoiusly seen by colorectal surgery and they stated surgery would produce more scarring and potential complications. Seen by previous provider who stated this may be something she has to live with. Her concern has been that she has had to reduce the rectum 5x per day which is more than previously. Patient has continued to have intermittent numbness in the legs, but does not want any intervenitons given then steroids caused delirium. She continues to have issues with memory recall since taking the steroids. Discussed with patient her daughter has had concerns about her mood. She states her mood has been affected by the rectum issues.  She does not want to change the amytryptiline she has been on it since she was in her 64s. Discussed that she was initially started on this medication for sleep and it helped her mood. She likes the medication. Has never tried another medicaiton  Notes from discussion with patient: "reduce colon 5x wears a wad of toilet papter to help with it but it's an issue. She sits until she feels it pop in. Every time she goes to the bathroom it comes back out.  cant stand w/o it alling  it's weak and sore and doe'snt think she can tolerate a foreing body in the rectum. her fall led to the left leg being numb. she has had more isues.   mood-- not depressed but has to deal with hit. doesn't feel depression. it's the muscles in the bottom. "  Past Medical History:  Diagnosis Date   Acute GI bleeding 03/16/2020   Allergy    Arthritis    s/p bilateral knees and left hip replacement   Asthma    CAD (coronary artery disease)    a. 12/1996 s/p CABG x4 [facility] [place]);  b. 2005 Pt reports stress test & cath, which revealed patent grafts.   Cancer [facility])    melanoma right arm   Carotid arterial disease ([place])    a. 04/2015 Carotid U/S: <50% bilat ICA stenosis.   Chronic kidney disease    Colon polyps    H/O   Depression    GERD (gastroesophageal reflux disease)    h/o hiatal hernia   Headache    migraines in  past   Heart murmur    a. 04/2011 Echo: EF 55-60%, bilat atrial enlargement, mild to mod TR.   History of chicken pox    History of hiatal hernia    Hx of migraines    rare now   Hx: UTI (urinary tract infection)    Hyperlipidemia    a. Statin intolerant -->on zetia.   Hypertension    Hypertensive heart disease    Lichen planus    Melanoma (HCC)    Palpitations    a. rare PVC's and h/o SVT.   PMR (polymyalgia rheumatica) (HCC)    h/o in setting of crestor usage.   PSVT (paroxysmal supraventricular tachycardia)    PUD (peptic ulcer disease)    remote history   Raynaud's phenomenon     Spinal stenosis    Urine incontinence    H/O   Vitamin D deficiency    Past Surgical History:  Procedure Laterality Date   ADENOIDECTOMY     age 34   BACK SURGERY     L3-L5   BILATERAL CARPAL TUNNEL RELEASE     BREAST BIOPSY Right    bx x 3 neg   BREAST SURGERY Right    biopsy x 3 (all benign)   CARDIAC CATHETERIZATION N/A 06/01/2016   Procedure: LEFT HEART CATH AND CORS/GRAFTS ANGIOGRAPHY;  Surgeon: Wellington Hampshire, MD;  Location: [place] [facility];  Service: Cardiovascular;  Laterality: N/A;   CARDIAC CATHETERIZATION N/A 06/01/2016   Procedure: Coronary Stent Intervention;  Surgeon: Wellington Hampshire, MD;  Location: [place] [facility];  Service: Cardiovascular;  Laterality: N/A;   CARDIOVERSION N/A 03/08/2017   Procedure: CARDIOVERSION;  Surgeon: Wellington Hampshire, MD;  Location: [facility] ORS;  Service: Cardiovascular;  Laterality: N/A;   CATARACT EXTRACTION W/PHACO Right 01/04/2016   Procedure: CATARACT EXTRACTION PHACO AND INTRAOCULAR LENS PLACEMENT ([place]);  Surgeon: Leandrew Koyanagi, MD;  Location: [place];  Service: Ophthalmology;  Laterality: Right;  MALYUGIN   CATARACT EXTRACTION W/PHACO Left 01/25/2016   Procedure: CATARACT EXTRACTION PHACO AND INTRAOCULAR LENS PLACEMENT ([place]) left eye;  Surgeon: Leandrew Koyanagi, MD;  Location: [place];  Service: Ophthalmology;  Laterality: Left;  MALYUGIN SHUGARCAINE   CHOLECYSTECTOMY  90's   COLONOSCOPY  03/19/2020   Procedure: COLONOSCOPY;  Surgeon: Virgel Manifold, MD;  Location: [facility] ENDOSCOPY;  Service: Gastroenterology;;   COLONOSCOPY WITH PROPOFOL N/A 05/03/2015   Procedure: COLONOSCOPY WITH PROPOFOL;  Surgeon: Lollie Sails, MD;  [facility];  Service: Endoscopy;  Laterality: N/A;   COLONOSCOPY WITH PROPOFOL N/A 03/19/2020   Procedure: COLONOSCOPY WITH PROPOFOL;  Surgeon: Virgel Manifold, MD;  Location: [facility] ENDOSCOPY;  Service: Endoscopy;  Laterality: N/A;   CORONARY ARTERY BYPASS  GRAFT  98   4 vessel   CORONARY ARTERY BYPASS GRAFT     x 4 age 58 per pt    CORONARY STENT INTERVENTION N/A 03/31/2018   Procedure: CORONARY STENT INTERVENTION;  Surgeon: Wellington Hampshire, MD;  Location: [place] [facility];  Service: Cardiovascular;  Laterality: N/A;   ESOPHAGOGASTRODUODENOSCOPY N/A 03/22/2015   Procedure: ESOPHAGOGASTRODUODENOSCOPY (EGD);  Surgeon: Lollie Sails, MD;  Location: [facility] ENDOSCOPY;  Service: Endoscopy;  Laterality: N/A;   ESOPHAGOGASTRODUODENOSCOPY N/A 03/19/2020   Procedure: ESOPHAGOGASTRODUODENOSCOPY (EGD);  Surgeon: Virgel Manifold, MD;  Location: [facility] ENDOSCOPY;  Service: Endoscopy;  Laterality: N/A;   HEMORRHOID BANDING     HEMORRHOID SURGERY N/A 02/17/2018   Procedure: HEMORRHOIDECTOMY;  Surgeon: Robert Bellow, MD;  Location: [facility] ORS;  Service: General;  Laterality: N/A;   JOINT REPLACEMENT     BILATERAL KNEE REPLACEMENTS   KNEE ARTHROSCOPY W/ OATS PROCEDURE     Lt knee (9/01), Rt knee (3/11), Lt hip (5/10)   LEFT HEART CATH AND CORONARY ANGIOGRAPHY N/A 03/31/2018   Procedure: LEFT HEART CATH AND CORONARY ANGIOGRAPHY;  Surgeon: Wellington Hampshire, MD;  Location: [place] [facility];  Service: Cardiovascular;  Laterality: N/A;   TOTAL HIP ARTHROPLASTY      Allergies  Allergen Reactions   Beta Adrenergic Blockers Shortness Of Breath    DEPRESSION/HYPOTENSION   Brilinta [Ticagrelor] Shortness Of Breath    Caused AFIB   Albuterol     rigors   Antihistamines, Diphenhydramine-Type     Muscle spasms   Aspirin Other (See Comments)    Heartburn    Nsaids Other (See Comments)    ULCER HISTORY & CURRENTLY ON BLOOD THINNERS   Penicillins Hives and Other (See Comments)    Has patient had a PCN reaction causing immediate rash, facial/tongue/throat swelling, SOB or lightheadedness with hypotension: No Has patient had a PCN reaction causing severe rash involving mucus membranes or skin necrosis: No Has patient had a PCN reaction that required  hospitalization No Has patient had a PCN reaction occurring within the last 10 years: No If all of the above answers are "NO", then may proceed with Cephalosporin use.    Statins Other (See Comments)    Muscle weakness   Sulfasalazine Rash    At mouth   Tetracycline Rash   Tetracyclines & Related Rash    Outpatient Encounter Medications as of 07/13/2022  Medication Sig   acetaminophen (TYLENOL) 325 MG tablet Take 650 mg by mouth every 4 (four) hours as needed for mild pain or moderate pain.   acetaminophen (TYLENOL) 500 MG tablet Take 1,000 mg by mouth 2 (two) times daily.   amitriptyline (ELAVIL) 25 MG tablet TAKE 1 TABLET BY MOUTH AT BEDTIME   ATROVENT HFA 17 MCG/ACT inhaler INHALE 2 PUFFS INTO THE LUNGS EVERY 6 HOURS AS NEEDED FOR WHEEZING   b complex vitamins tablet Take 1 tablet by mouth daily.   bismuth subsalicylate (QC PINK BISMUTH) 262 MG/15ML suspension Take 10 mLs by mouth as directed. Every 24 hours as needed for loose stools   Cholecalciferol (VITAMIN D3) 2000 UNITS TABS Take 1,000 Units by mouth 2 (two) times a week. Tuesday and Saturdays.   Coenzyme Q10 (COQ-10) 200 MG CAPS Take 200 mg by mouth daily.    conjugated estrogens (PREMARIN) vaginal cream Apply 0.'5mg'$  (pea-sized amount)  just inside the vaginal introitus with a finger-tip on  Monday, Wednesday and Friday nights.   CRANBERRY PO Take 1 capsule by mouth 4 (four) times daily.   dextromethorphan-guaiFENesin (ROBITUSSIN-DM) 10-100 MG/5ML liquid Take 10 mLs by mouth every 4 (four) hours as needed for cough.   ELIQUIS 5 MG TABS tablet TAKE 1 TABLET BY MOUTH TWICE DAILY   ezetimibe (ZETIA) 10 MG tablet TAKE 1 TABLET BY MOUTH DAILY   FEROSUL 325 (65 Fe) MG tablet TAKE ONE TABLET EVERY DAY   furosemide (LASIX) 40 MG tablet Take 40 mg by mouth daily.   hydrocortisone 2.5 % cream APPLY TO LICHEN PLANUS AS DIRECTED   ketoconazole (NIZORAL) 2 % cream Apply 1 application topically daily. (Patient taking differently: Apply 1  application  topically daily as needed (For bleeding fingers).)   lisinopril (ZESTRIL) 20 MG tablet Take 20 mg by mouth daily.   loratadine (CLARITIN) 10 MG tablet  Take 10 mg by mouth daily.   miconazole (MICOTIN) 2 % powder Apply 1 Application topically as directed. Apply to vagina every 24 hours as need for vaginal itching   miconazole (MONISTAT 7) 2 % vaginal cream Place 1 Applicatorful vaginally as directed. Every 24 hours as needed for lichen planus   Multiple Vitamins-Minerals (HAIR/SKIN/NAILS PO) Take 1 tablet by mouth daily.   ondansetron (ZOFRAN) 4 MG tablet Take 4 mg by mouth every 8 (eight) hours as needed for nausea or vomiting.   pantoprazole (PROTONIX) 40 MG tablet TAKE 1 TABLET BY MOUTH TWICE DAILY   polyethylene glycol (MIRALAX / GLYCOLAX) 17 g packet Take 17 g by mouth daily. In the evening for bowel regimen   REPATHA SURECLICK 144 MG/ML SOAJ INJECT 1 PEN EVERY 14 DAYS AS DIRECTED   silver sulfADIAZINE (SILVADENE) 1 % cream Apply 1 Application topically 2 (two) times daily. For wound care   spironolactone (ALDACTONE) 25 MG tablet Take 1 tablet (25 mg total) by mouth every morning.   traMADol (ULTRAM) 50 MG tablet TAKE ONE TABLET TWICE DAILY AS NEEDED   traZODone (DESYREL) 150 MG tablet Two tablets by mouth at bedtime as needed for sleep   triamcinolone cream (KENALOG) 0.1 % Apply 1 Application topically every 12 (twelve) hours as needed (for rash).   UNABLE TO FIND Med Name: Glucose oral gel 15GM/32ML give 32 mL by mouth every 24 hours as needed for low blood sugar   verapamil (CALAN-SR) 180 MG CR tablet TAKE ONE (1) TABLET BY MOUTH TWO TIMES PER DAY   vitamin B-12 (CYANOCOBALAMIN) 1000 MCG tablet Take 1,000 mcg by mouth daily.   vitamin C (ASCORBIC ACID) 500 MG tablet Take 500 mg by mouth at bedtime.   [DISCONTINUED] furosemide (LASIX) 40 MG tablet TAKE 1 TABLET ('40MG'$ ) IN THE MORNING AND TAKE 1/2 TABLET ('20MG'$ ) IN THE EVENING   [DISCONTINUED] polyethylene glycol powder  (GLYCOLAX/MIRALAX) 17 GM/SCOOP powder Take 8.5 g by mouth daily. Hold if diarrhea   [DISCONTINUED] ramipril (ALTACE) 5 MG capsule TAKE 1 CAPSULE BY MOUTH ONCE DAILY   No facility-administered encounter medications on file as of 07/13/2022.    Review of Systems  All other systems reviewed and are negative.  Immunization History  Administered Date(s) Administered   Fluad Quad(high Dose 65+) 06/22/2021   Influenza, High Dose Seasonal PF 06/14/2016, 07/08/2018, 07/16/2019   Influenza,inj,Quad PF,6+ Mos 06/15/2014   Influenza-Unspecified 07/13/2015   Moderna Covid-19 Vaccine Bivalent Booster 80yr & up 06/30/2021   Moderna Sars-Covid-2 Vaccination 10/22/2019, 11/19/2019, 08/19/2020, 02/24/2021   Pneumococcal Conjugate-13 01/13/2015   Pneumococcal Polysaccharide-23 06/21/2009   Tdap 02/22/2010   Zoster, Live 11/15/2010   Pertinent  Health Maintenance Due  Topic Date Due   INFLUENZA VACCINE  05/08/2022   MAMMOGRAM  09/07/2022 (Originally 12/18/2019)   DEXA SCAN  09/07/2022 (Originally 04/08/2003)   COLONOSCOPY (Pts 45-457yrInsurance coverage will need to be confirmed)  03/19/2025      06/21/2022    3:16 AM 06/21/2022    9:33 AM 06/21/2022    8:00 PM 06/22/2022   10:00 AM 06/27/2022   11:15 AM  Fall Risk  Patient Fall Risk Level High fall risk High fall risk High fall risk High fall risk High fall risk  Patient at Risk for Falls Due to     History of fall(s)  Fall risk Follow up     Falls evaluation completed   Functional Status Survey:    Vitals:   07/13/22 1140  BP: 125/78  Pulse:  88  Resp: 16  Temp: 97.8 F (36.6 C)  SpO2: 93%  Weight: 178 lb (80.7 kg)  Height: '5\' 2"'$  (1.575 m)   Body mass index is 32.56 kg/m. Physical Exam Constitutional:      Appearance: Normal appearance.  Cardiovascular:     Rate and Rhythm: Normal rate.     Pulses: Normal pulses.     Heart sounds: Normal heart sounds.  Pulmonary:     Effort: Pulmonary effort is normal.     Breath sounds:  Normal breath sounds.  Genitourinary:    Comments: Deferred per patient preference.  Skin:    General: Skin is warm and dry.     Comments: Bruises of bilateral thighs were legs contact the wheelchair.  Neurological:     Mental Status: She is alert and oriented to person, place, and time. Mental status is at baseline.   Labs reviewed: Recent Labs    05/10/22 1532 06/20/22 1831 06/22/22 0534  NA 138 137 136  K 4.2 4.3 4.1  CL 100 101 104  CO2 '30 25 23  '$ GLUCOSE 147* 108* 146*  BUN 31* 26* 21  CREATININE 1.33* 1.21* 0.88  CALCIUM 10.3 9.7 9.8  MG  --   --  2.3   Recent Labs    01/11/22 0000 05/10/22 1532 06/20/22 2136  AST '21 21 27  '$ ALT '20 18 21  '$ ALKPHOS 46 50 54  BILITOT  --  0.5 1.0  PROT  --  6.6 6.9  ALBUMIN 4.5 4.4 4.2   Recent Labs    05/10/22 1532 06/20/22 1831 06/21/22 0835  WBC 5.5 6.5 6.4  NEUTROABS 3.9  --   --   HGB 13.0 12.2 12.9  HCT 38.7 37.8 38.8  MCV 103.3* 104.1* 101.0*  PLT 242.0 224 233   Lab Results  Component Value Date   TSH 1.58 01/11/2022   Lab Results  Component Value Date   HGBA1C 6.1 05/10/2022   Lab Results  Component Value Date   CHOL 121 05/10/2022   HDL 56.80 05/10/2022   LDLCALC 41 05/10/2022   LDLDIRECT 126.7 07/03/2013   TRIG 120.0 05/10/2022   CHOLHDL 2 05/10/2022    Significant Diagnostic Results in last 30 days:  MR LUMBAR SPINE WO CONTRAST  Result Date: 06/20/2022 CLINICAL DATA:  SPINAL STENOSIS.  LEFT-SIDED WEAKNESS.  RECENT FALL. EXAM: MRI LUMBAR SPINE WITHOUT CONTRAST TECHNIQUE: Multiplanar, multisequence MR imaging of the lumbar spine was performed. No intravenous contrast was administered. COMPARISON:  03/25/2017 FINDINGS: Segmentation:  Standard. Alignment: Grade 1 retrolisthesis at L1-2 and L2-3. Grade 1 anterolisthesis at L4-5. Vertebrae: Posterior fusion instrumentation extends from L3-L5 with screws at the L3 and L5 levels. There is no acute fracture. The L4 level is posteriorly decompressed. Conus  medullaris and cauda equina: Conus extends to the L1 level. Conus and cauda equina appear normal. Paraspinal and other soft tissues: Negative Disc levels: T12-L1: Unchanged small disc bulge without spinal canal stenosis. L1-L2: Mild worsening disc bulge with endplate osteophytes. Progression of severe spinal canal stenosis. No neural foraminal stenosis. L2-L3: Unchanged disc bulge with endplate spurring. Unchanged severe spinal canal stenosis. Unchanged severe right and moderate left neural foraminal stenosis. L3-L4: Interbody fusion with posterior instrumentation connecting L3 to L5. No spinal canal stenosis. No neural foraminal stenosis. L4-L5: Interbody fusion with posterior instrumentation connecting L5 to L3. No spinal canal stenosis. No neural foraminal stenosis. L5-S1: Unchanged disc bulge. Unchanged moderate spinal canal stenosis. Unchanged severe left neural foraminal stenosis. Visualized sacrum: Normal. IMPRESSION:  1. Progression of severe spinal canal stenosis at L1-L2. 2. Unchanged severe spinal canal stenosis and severe right, moderate left neural foraminal stenosis at L2-L3. 3. Unchanged moderate L5-S1 spinal canal stenosis and severe left neural foraminal stenosis. 4. Posterior fusion instrumentation from L3-L5 without residual spinal canal stenosis. Electronically Signed   By: Ulyses Jarred M.D.   On: 06/20/2022 22:41   DG Chest Port 1 View  Result Date: 06/20/2022 CLINICAL DATA:  Increased weakness; questionable sepsis EXAM: PORTABLE CHEST 1 VIEW COMPARISON:  Radiographs 03/25/2018 FINDINGS: Stable cardiomegaly. Sternotomy and CABG. Aortic atherosclerotic calcification. Elevated left hemidiaphragm and left basilar atelectasis. No focal consolidation, pleural effusion, or pneumothorax. No acute osseous abnormality. IMPRESSION: No change from 03/25/2018. Stable cardiomegaly and aortic atherosclerosis. Elevated left hemidiaphragm with left basilar atelectasis. Electronically Signed   By: Placido Sou M.D.   On: 06/20/2022 21:38   CT Head Wo Contrast  Result Date: 06/20/2022 CLINICAL DATA:  Head trauma, minor (Age >= 65y) EXAM: CT HEAD WITHOUT CONTRAST TECHNIQUE: Contiguous axial images were obtained from the base of the skull through the vertex without intravenous contrast. RADIATION DOSE REDUCTION: This exam was performed according to the departmental dose-optimization program which includes automated exposure control, adjustment of the mA and/or kV according to patient size and/or use of iterative reconstruction technique. COMPARISON:  06/13/2022 FINDINGS: Brain: No acute intracranial abnormality. Specifically, no hemorrhage, hydrocephalus, mass lesion, acute infarction, or significant intracranial injury. Age related volume loss. Vascular: No hyperdense vessel or unexpected calcification. Skull: No acute calvarial abnormality. Sinuses/Orbits: Clear Other: None IMPRESSION: No acute intracranial abnormality. Electronically Signed   By: Rolm Baptise M.D.   On: 06/20/2022 21:27   DG Hip Unilat With Pelvis 2-3 Views Left  Result Date: 06/13/2022 CLINICAL DATA:  Trauma, fall EXAM: DG HIP (WITH OR WITHOUT PELVIS) 2-3V LEFT COMPARISON:  None Available. FINDINGS: There is previous left hip arthroplasty. No recent fracture or dislocation is seen. Smoothly marginated calcifications lateral to the left acetabulum and greater trochanter of proximal left femur may be residual from previous trauma or previous surgery. Osteopenia is seen and bony structures. There is lumbar fusion. IMPRESSION: Previous left hip arthroplasty. No recent fracture or dislocation is seen. Electronically Signed   By: Elmer Picker M.D.   On: 06/13/2022 18:19   CT Lumbar Spine Wo Contrast  Result Date: 06/13/2022 : FALL.  Back trauma, no prior imaging (Age >= 16y) EXAM: CT LUMBAR SPINE WITHOUT CONTRAST TECHNIQUE: Multidetector CT imaging of the lumbar spine was performed without intravenous contrast administration.  Multiplanar CT image reconstructions were also generated. RADIATION DOSE REDUCTION: This exam was performed according to the departmental dose-optimization program which includes automated exposure control, adjustment of the mA and/or kV according to patient size and/or use of iterative reconstruction technique. COMPARISON:  MRI lumbar spine 03/25/2017 FINDINGS: Segmentation: 5 lumbar type vertebrae. Alignment: Slightly worsened grade 1 anterolisthesis of L4 on L5. Vertebrae: L3-L5 posterolateral surgical hardware with multilevel severe degenerative changes spine. L4 laminectomy. Vague cortical irregularity along the right L1-L2 transverse process likely old healed fractures. No definite acute displaced fracture or focal pathologic process. Paraspinal and other soft tissues: Negative. Disc levels: Multilevel severe intervertebral disc space narrowing and vacuum phenomenon. Other: Atherosclerotic plaque. Status post cholecystectomy. Nonspecific Coarsely calcified lesion within the pelvis partially visualized that could possibly represent degenerative uterine fibroids. IMPRESSION: 1. No definite acute displaced fracture or dislocation in a patient with L3-L5 surgical hardware. 2. Multilevel severe degenerative changes of the spine with slightly worsened grade 1  anterolisthesis of L4 on L5. 3.  Aortic Atherosclerosis (ICD10-I70.0). Electronically Signed   By: Iven Finn M.D.   On: 06/13/2022 18:11   CT Head Wo Contrast  Result Date: 06/13/2022 CLINICAL DATA:  Head trauma, minor (Age >= 65y); Neck trauma (Age >= 65y). Fall EXAM: CT HEAD WITHOUT CONTRAST CT CERVICAL SPINE WITHOUT CONTRAST TECHNIQUE: Multidetector CT imaging of the head and cervical spine was performed following the standard protocol without intravenous contrast. Multiplanar CT image reconstructions of the cervical spine were also generated. RADIATION DOSE REDUCTION: This exam was performed according to the departmental dose-optimization program  which includes automated exposure control, adjustment of the mA and/or kV according to patient size and/or use of iterative reconstruction technique. COMPARISON:  CT head and neck 03/19/2018 FINDINGS: CT HEAD FINDINGS BRAIN: Cerebral ventricle sizes are concordant with the degree of cerebral volume loss. Patchy and confluent areas of decreased attenuation are noted throughout the deep and periventricular white matter of the cerebral hemispheres bilaterally, compatible with chronic microvascular ischemic disease. No evidence of large-territorial acute infarction. No parenchymal hemorrhage. No mass lesion. No extra-axial collection. No mass effect or midline shift. No hydrocephalus. Basilar cisterns are patent. Empty sella. Vascular: No hyperdense vessel. Skull: No acute fracture or focal lesion. Sinuses/Orbits: Paranasal sinuses and mastoid air cells are clear. Bilateral lens replacement. Otherwise the orbits are unremarkable. Other: None. CT CERVICAL SPINE FINDINGS Alignment: Grade 1 anterolisthesis of C4 on C5. Skull base and vertebrae: Multilevel at least moderate to severe degenerative changes spine most prominent at the C5-C7 level. No acute fracture. No aggressive appearing focal osseous lesion or focal pathologic process. Soft tissues and spinal canal: No prevertebral fluid or swelling. No visible canal hematoma. Upper chest: Unremarkable. Other: Similar-appearing asymmetrically larger right thyroid gland point to the left with associated calcified lesion subcentimeter hypodensity. In the setting of significant comorbidities or limited life expectancy, no follow-up recommended (ref: J Am Coll Radiol. 2015 Feb;12(2): 143-50). IMPRESSION: 1. No acute intracranial abnormality. 2. No acute displaced fracture or traumatic listhesis of the cervical spine. 3. Empty sella. Findings is often a normal anatomic variant but can be associated with idiopathic intracranial hypertension (pseudotumor cerebri). Electronically  Signed   By: Iven Finn M.D.   On: 06/13/2022 18:05   CT Cervical Spine Wo Contrast  Result Date: 06/13/2022 CLINICAL DATA:  Head trauma, minor (Age >= 65y); Neck trauma (Age >= 65y). Fall EXAM: CT HEAD WITHOUT CONTRAST CT CERVICAL SPINE WITHOUT CONTRAST TECHNIQUE: Multidetector CT imaging of the head and cervical spine was performed following the standard protocol without intravenous contrast. Multiplanar CT image reconstructions of the cervical spine were also generated. RADIATION DOSE REDUCTION: This exam was performed according to the departmental dose-optimization program which includes automated exposure control, adjustment of the mA and/or kV according to patient size and/or use of iterative reconstruction technique. COMPARISON:  CT head and neck 03/19/2018 FINDINGS: CT HEAD FINDINGS BRAIN: Cerebral ventricle sizes are concordant with the degree of cerebral volume loss. Patchy and confluent areas of decreased attenuation are noted throughout the deep and periventricular white matter of the cerebral hemispheres bilaterally, compatible with chronic microvascular ischemic disease. No evidence of large-territorial acute infarction. No parenchymal hemorrhage. No mass lesion. No extra-axial collection. No mass effect or midline shift. No hydrocephalus. Basilar cisterns are patent. Empty sella. Vascular: No hyperdense vessel. Skull: No acute fracture or focal lesion. Sinuses/Orbits: Paranasal sinuses and mastoid air cells are clear. Bilateral lens replacement. Otherwise the orbits are unremarkable. Other: None. CT CERVICAL SPINE FINDINGS  Alignment: Grade 1 anterolisthesis of C4 on C5. Skull base and vertebrae: Multilevel at least moderate to severe degenerative changes spine most prominent at the C5-C7 level. No acute fracture. No aggressive appearing focal osseous lesion or focal pathologic process. Soft tissues and spinal canal: No prevertebral fluid or swelling. No visible canal hematoma. Upper chest:  Unremarkable. Other: Similar-appearing asymmetrically larger right thyroid gland point to the left with associated calcified lesion subcentimeter hypodensity. In the setting of significant comorbidities or limited life expectancy, no follow-up recommended (ref: J Am Coll Radiol. 2015 Feb;12(2): 143-50). IMPRESSION: 1. No acute intracranial abnormality. 2. No acute displaced fracture or traumatic listhesis of the cervical spine. 3. Empty sella. Findings is often a normal anatomic variant but can be associated with idiopathic intracranial hypertension (pseudotumor cerebri). Electronically Signed   By: Iven Finn M.D.   On: 06/13/2022 18:05    Assessment/Plan 1. Do not resuscitate Patient has DNR paperwork  2. Rectal prolapse Patient has continued issues with rectal prolapse. Discussed this is likely something to live with given she is not seeking surgical interventions. pelvic floor muscle exercises, can be tried to alleviate symptoms. Will continue to review current literature to determine additional options for management. Prolapse is impacting her work with physical therapy which could impact her overall progress.   3. Anxiety Patient states her mood has been stable despite health changes. Discussed change of medication to something like zoloft, however this was declined. Plan to continue amitriptyline 25 mg daily. It would be beneficial to have additional familial support during this transitional life phase.      Family/ staff Communication: nursing will call her daughter for an update.   Labs/tests ordered:  none  Tomasa Rand, MD, [facility] [phone]

## 2022-07-19 ENCOUNTER — Other Ambulatory Visit: Payer: Self-pay | Admitting: *Deleted

## 2022-07-19 NOTE — Patient Outreach (Signed)
[place] Coordinator follow up.   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 She will transition to LTC ON 07/21/22.  No identifiable [facility] needs.   Marthenia Rolling, MSN, RN,BSN [place] [facility] Coordinator [phone] (Direct dial)

## 2022-07-23 DIAGNOSIS — I5032 Chronic diastolic (congestive) heart failure: Secondary | ICD-10-CM | POA: Diagnosis not present

## 2022-07-23 DIAGNOSIS — M48061 Spinal stenosis, lumbar region without neurogenic claudication: Secondary | ICD-10-CM | POA: Diagnosis not present

## 2022-07-23 DIAGNOSIS — M6281 Muscle weakness (generalized): Secondary | ICD-10-CM | POA: Diagnosis not present

## 2022-07-23 DIAGNOSIS — R278 Other lack of coordination: Secondary | ICD-10-CM | POA: Diagnosis not present

## 2022-07-23 DIAGNOSIS — N1831 Chronic kidney disease, stage 3a: Secondary | ICD-10-CM | POA: Diagnosis not present

## 2022-07-23 DIAGNOSIS — M48062 Spinal stenosis, lumbar region with neurogenic claudication: Secondary | ICD-10-CM | POA: Diagnosis not present

## 2022-07-23 DIAGNOSIS — Z741 Need for assistance with personal care: Secondary | ICD-10-CM | POA: Diagnosis not present

## 2022-07-25 DIAGNOSIS — I5032 Chronic diastolic (congestive) heart failure: Secondary | ICD-10-CM | POA: Diagnosis not present

## 2022-07-25 DIAGNOSIS — M48061 Spinal stenosis, lumbar region without neurogenic claudication: Secondary | ICD-10-CM | POA: Diagnosis not present

## 2022-07-25 DIAGNOSIS — N1831 Chronic kidney disease, stage 3a: Secondary | ICD-10-CM | POA: Diagnosis not present

## 2022-07-25 DIAGNOSIS — R278 Other lack of coordination: Secondary | ICD-10-CM | POA: Diagnosis not present

## 2022-07-25 DIAGNOSIS — M6281 Muscle weakness (generalized): Secondary | ICD-10-CM | POA: Diagnosis not present

## 2022-07-25 DIAGNOSIS — M48062 Spinal stenosis, lumbar region with neurogenic claudication: Secondary | ICD-10-CM | POA: Diagnosis not present

## 2022-07-26 DIAGNOSIS — I5032 Chronic diastolic (congestive) heart failure: Secondary | ICD-10-CM | POA: Diagnosis not present

## 2022-07-26 DIAGNOSIS — M6281 Muscle weakness (generalized): Secondary | ICD-10-CM | POA: Diagnosis not present

## 2022-07-26 DIAGNOSIS — M48062 Spinal stenosis, lumbar region with neurogenic claudication: Secondary | ICD-10-CM | POA: Diagnosis not present

## 2022-07-26 DIAGNOSIS — M48061 Spinal stenosis, lumbar region without neurogenic claudication: Secondary | ICD-10-CM | POA: Diagnosis not present

## 2022-07-26 DIAGNOSIS — N1831 Chronic kidney disease, stage 3a: Secondary | ICD-10-CM | POA: Diagnosis not present

## 2022-07-26 DIAGNOSIS — R278 Other lack of coordination: Secondary | ICD-10-CM | POA: Diagnosis not present

## 2022-07-30 DIAGNOSIS — M48061 Spinal stenosis, lumbar region without neurogenic claudication: Secondary | ICD-10-CM | POA: Diagnosis not present

## 2022-07-30 DIAGNOSIS — I5032 Chronic diastolic (congestive) heart failure: Secondary | ICD-10-CM | POA: Diagnosis not present

## 2022-07-30 DIAGNOSIS — M48062 Spinal stenosis, lumbar region with neurogenic claudication: Secondary | ICD-10-CM | POA: Diagnosis not present

## 2022-07-30 DIAGNOSIS — R278 Other lack of coordination: Secondary | ICD-10-CM | POA: Diagnosis not present

## 2022-07-30 DIAGNOSIS — M6281 Muscle weakness (generalized): Secondary | ICD-10-CM | POA: Diagnosis not present

## 2022-07-30 DIAGNOSIS — N1831 Chronic kidney disease, stage 3a: Secondary | ICD-10-CM | POA: Diagnosis not present

## 2022-08-01 DIAGNOSIS — M6281 Muscle weakness (generalized): Secondary | ICD-10-CM | POA: Diagnosis not present

## 2022-08-01 DIAGNOSIS — I5032 Chronic diastolic (congestive) heart failure: Secondary | ICD-10-CM | POA: Diagnosis not present

## 2022-08-01 DIAGNOSIS — M48062 Spinal stenosis, lumbar region with neurogenic claudication: Secondary | ICD-10-CM | POA: Diagnosis not present

## 2022-08-01 DIAGNOSIS — N1831 Chronic kidney disease, stage 3a: Secondary | ICD-10-CM | POA: Diagnosis not present

## 2022-08-01 DIAGNOSIS — M48061 Spinal stenosis, lumbar region without neurogenic claudication: Secondary | ICD-10-CM | POA: Diagnosis not present

## 2022-08-01 DIAGNOSIS — R278 Other lack of coordination: Secondary | ICD-10-CM | POA: Diagnosis not present

## 2022-08-03 ENCOUNTER — Other Ambulatory Visit: Payer: Self-pay | Admitting: Cardiovascular Disease

## 2022-08-03 DIAGNOSIS — R278 Other lack of coordination: Secondary | ICD-10-CM | POA: Diagnosis not present

## 2022-08-03 DIAGNOSIS — M6281 Muscle weakness (generalized): Secondary | ICD-10-CM | POA: Diagnosis not present

## 2022-08-03 DIAGNOSIS — N1831 Chronic kidney disease, stage 3a: Secondary | ICD-10-CM | POA: Diagnosis not present

## 2022-08-03 DIAGNOSIS — M48061 Spinal stenosis, lumbar region without neurogenic claudication: Secondary | ICD-10-CM | POA: Diagnosis not present

## 2022-08-03 DIAGNOSIS — M48062 Spinal stenosis, lumbar region with neurogenic claudication: Secondary | ICD-10-CM | POA: Diagnosis not present

## 2022-08-03 DIAGNOSIS — I5032 Chronic diastolic (congestive) heart failure: Secondary | ICD-10-CM | POA: Diagnosis not present

## 2022-08-03 NOTE — Telephone Encounter (Signed)
Needs appt with APP for refills. Cancelled last scheduled office visit. Overdue. Thank you!

## 2022-08-06 DIAGNOSIS — M48062 Spinal stenosis, lumbar region with neurogenic claudication: Secondary | ICD-10-CM | POA: Diagnosis not present

## 2022-08-06 DIAGNOSIS — N1831 Chronic kidney disease, stage 3a: Secondary | ICD-10-CM | POA: Diagnosis not present

## 2022-08-06 DIAGNOSIS — M6281 Muscle weakness (generalized): Secondary | ICD-10-CM | POA: Diagnosis not present

## 2022-08-06 DIAGNOSIS — R278 Other lack of coordination: Secondary | ICD-10-CM | POA: Diagnosis not present

## 2022-08-06 DIAGNOSIS — I5032 Chronic diastolic (congestive) heart failure: Secondary | ICD-10-CM | POA: Diagnosis not present

## 2022-08-06 DIAGNOSIS — M48061 Spinal stenosis, lumbar region without neurogenic claudication: Secondary | ICD-10-CM | POA: Diagnosis not present

## 2022-08-06 NOTE — Telephone Encounter (Signed)
Can you call and get this pt scheduled for a hospital FU virtual appointment?

## 2022-08-06 NOTE — Telephone Encounter (Signed)
Pt calling to see if there is anyway that she is able to have a telephone visit for her f/u. Pt states that she is confined to her home and is not able to come into the office. Please advise

## 2022-08-08 ENCOUNTER — Non-Acute Institutional Stay (SKILLED_NURSING_FACILITY): Payer: Medicare Other | Admitting: Student

## 2022-08-08 ENCOUNTER — Encounter: Payer: Self-pay | Admitting: Student

## 2022-08-08 DIAGNOSIS — F324 Major depressive disorder, single episode, in partial remission: Secondary | ICD-10-CM | POA: Diagnosis not present

## 2022-08-08 DIAGNOSIS — M6281 Muscle weakness (generalized): Secondary | ICD-10-CM | POA: Diagnosis not present

## 2022-08-08 DIAGNOSIS — K2100 Gastro-esophageal reflux disease with esophagitis, without bleeding: Secondary | ICD-10-CM | POA: Diagnosis not present

## 2022-08-08 DIAGNOSIS — M48062 Spinal stenosis, lumbar region with neurogenic claudication: Secondary | ICD-10-CM | POA: Diagnosis not present

## 2022-08-08 DIAGNOSIS — R2681 Unsteadiness on feet: Secondary | ICD-10-CM | POA: Diagnosis not present

## 2022-08-08 DIAGNOSIS — N1831 Chronic kidney disease, stage 3a: Secondary | ICD-10-CM | POA: Diagnosis not present

## 2022-08-08 DIAGNOSIS — L439 Lichen planus, unspecified: Secondary | ICD-10-CM | POA: Diagnosis not present

## 2022-08-08 DIAGNOSIS — I739 Peripheral vascular disease, unspecified: Secondary | ICD-10-CM | POA: Diagnosis not present

## 2022-08-08 DIAGNOSIS — I4819 Other persistent atrial fibrillation: Secondary | ICD-10-CM | POA: Diagnosis not present

## 2022-08-08 DIAGNOSIS — I5032 Chronic diastolic (congestive) heart failure: Secondary | ICD-10-CM | POA: Diagnosis not present

## 2022-08-08 DIAGNOSIS — Z741 Need for assistance with personal care: Secondary | ICD-10-CM | POA: Diagnosis not present

## 2022-08-08 DIAGNOSIS — K623 Rectal prolapse: Secondary | ICD-10-CM

## 2022-08-08 DIAGNOSIS — R278 Other lack of coordination: Secondary | ICD-10-CM | POA: Diagnosis not present

## 2022-08-08 DIAGNOSIS — M48061 Spinal stenosis, lumbar region without neurogenic claudication: Secondary | ICD-10-CM | POA: Diagnosis not present

## 2022-08-08 MED ORDER — APIXABAN 2.5 MG PO TABS: 2.5000 mg | ORAL_TABLET | Freq: Two times a day (BID) | ORAL | Status: DC

## 2022-08-08 NOTE — Progress Notes (Signed)
[facility]  [facility] Room Number: 103-A Place of Service:  SNF [phone]) Provider:  Dewayne Shorter, MD  Patient Care Team: Dewayne Shorter, MD as PCP - General (Family Medicine) Wende Bushy, MD as Consulting Physician (Cardiology) Clent Jacks, RN as Registered Nurse Rockey Situ Kathlene November, MD as Consulting Physician (Cardiology)  Extended Emergency Contact Information Primary Emergency Contact: [place] Phone: [phone] Relation: Daughter Secondary Emergency Contact: Doroteo [place] Phone: [phone] Relation: Son  Code Status:  DNR Goals of care: Advanced Directive information    08/08/2022    9:15 AM  Advanced Directives  Does Patient Have a Medical Advance Directive? Yes  Type of Advance Directive Out of facility DNR (pink MOST or yellow form)  Does patient want to make changes to medical advance directive? No - Patient declined  Pre-existing out of facility DNR order (yellow form or pink MOST form) Yellow form placed in chart (order not valid for inpatient use)     Chief Complaint  Patient presents with   Medical Management of Chronic Issues    Routine visit. Discuss need for shingrix and additional covid boosters or post pone if patient refuses or is not a candidate. NCIR verified. Vitals and medications are a reflection of [facility] EMR system, [facility]      HPI:  Pt is a 86 y.o. female seen today for medical management of chronic diseases.   Her memory is declining. She can't remember  She has her rectum out for 4 hours a day. The rectum was in a couple of day sago and she went ot lunch and supper. Missed the fall festival.   She texted her odlder son. his birthday yesterday. Nice one is in [place]. Daughter is mean and bossy  She hasn't gotten up with rollator in a week becuas eof the rctum.   Her mood is resigned to this terrible life. She wouldn not mind if  the lord took her, but she doesn't feel like it's depression. Just as it is.   She would rather be in the recliner, but because of her rectum, can't spend much time outside of her wheelchair.   No receent headaches, no chest pain, BP within normal limits. NO issues with acid reflux "because of the protonix"   Past Medical History:  Diagnosis Date   Acute GI bleeding 03/16/2020   Allergy    Arthritis    s/p bilateral knees and left hip replacement   Asthma    CAD (coronary artery disease)    a. 12/1996 s/p CABG x4 [facility] [place]);  b. 2005 Pt reports stress test & cath, which revealed patent grafts.   Cancer [facility])    melanoma right arm   Carotid arterial disease ([place])    a. 04/2015 Carotid U/S: <50% bilat ICA stenosis.   Chronic kidney disease    Colon polyps    H/O   Depression    GERD (gastroesophageal reflux disease)    h/o hiatal hernia   Headache    migraines in past   Heart murmur    a. 04/2011 Echo: EF 55-60%, bilat atrial enlargement, mild to mod TR.   History of chicken pox    History of hiatal hernia    Hx of migraines    rare now   Hx: UTI (urinary tract infection)    Hyperlipidemia    a. Statin intolerant -->on zetia.   Hypertension    Hypertensive heart  disease    Lichen planus    Melanoma ([place])    Palpitations    a. rare PVC's and h/o SVT.   PMR (polymyalgia rheumatica) (HCC)    h/o in setting of crestor usage.   PSVT (paroxysmal supraventricular tachycardia)    PUD (peptic ulcer disease)    remote history   Raynaud's phenomenon    Spinal stenosis    Urine incontinence    H/O   Vitamin D deficiency    Past Surgical History:  Procedure Laterality Date   ADENOIDECTOMY     age 79   BACK SURGERY     L3-L5   BILATERAL CARPAL TUNNEL RELEASE     BREAST BIOPSY Right    bx x 3 neg   BREAST SURGERY Right    biopsy x 3 (all benign)   CARDIAC CATHETERIZATION N/A 06/01/2016   Procedure: LEFT HEART CATH AND CORS/GRAFTS ANGIOGRAPHY;  Surgeon: Wellington Hampshire,  MD;  Location: [place] CV LAB;  Service: Cardiovascular;  Laterality: N/A;   CARDIAC CATHETERIZATION N/A 06/01/2016   Procedure: Coronary Stent Intervention;  Surgeon: Wellington Hampshire, MD;  Location: [place] [facility];  Service: Cardiovascular;  Laterality: N/A;   CARDIOVERSION N/A 03/08/2017   Procedure: CARDIOVERSION;  Surgeon: Wellington Hampshire, MD;  Location: [facility] ORS;  Service: Cardiovascular;  Laterality: N/A;   CATARACT EXTRACTION W/PHACO Right 01/04/2016   Procedure: CATARACT EXTRACTION PHACO AND INTRAOCULAR LENS PLACEMENT ([place]);  Surgeon: Leandrew Koyanagi, MD;  Location: [place];  Service: Ophthalmology;  Laterality: Right;  MALYUGIN   CATARACT EXTRACTION W/PHACO Left 01/25/2016   Procedure: CATARACT EXTRACTION PHACO AND INTRAOCULAR LENS PLACEMENT ([place]) left eye;  Surgeon: Leandrew Koyanagi, MD;  Location: [place];  Service: Ophthalmology;  Laterality: Left;  MALYUGIN SHUGARCAINE   CHOLECYSTECTOMY  90's   COLONOSCOPY  03/19/2020   Procedure: COLONOSCOPY;  Surgeon: Virgel Manifold, MD;  Location: [facility] ENDOSCOPY;  Service: Gastroenterology;;   COLONOSCOPY WITH PROPOFOL N/A 05/03/2015   Procedure: COLONOSCOPY WITH PROPOFOL;  Surgeon: Lollie Sails, MD;  Location: [facility] ENDOSCOPY;  Service: Endoscopy;  Laterality: N/A;   COLONOSCOPY WITH PROPOFOL N/A 03/19/2020   Procedure: COLONOSCOPY WITH PROPOFOL;  Surgeon: Virgel Manifold, MD;  Location: [facility] ENDOSCOPY;  Service: Endoscopy;  Laterality: N/A;   CORONARY ARTERY BYPASS GRAFT  98   4 vessel   CORONARY ARTERY BYPASS GRAFT     x 4 age 77 per pt    CORONARY STENT INTERVENTION N/A 03/31/2018   Procedure: CORONARY STENT INTERVENTION;  Surgeon: Wellington Hampshire, MD;  Location: [place] [facility];  Service: Cardiovascular;  Laterality: N/A;   ESOPHAGOGASTRODUODENOSCOPY N/A 03/22/2015   Procedure: ESOPHAGOGASTRODUODENOSCOPY (EGD);  Surgeon: Lollie Sails, MD;  Location: [facility] ENDOSCOPY;  Service:  Endoscopy;  Laterality: N/A;   ESOPHAGOGASTRODUODENOSCOPY N/A 03/19/2020   Procedure: ESOPHAGOGASTRODUODENOSCOPY (EGD);  Surgeon: Virgel Manifold, MD;  Location[facility] ENDOSCOPY;  Service: Endoscopy;  Laterality: N/A;   HEMORRHOID BANDING     HEMORRHOID SURGERY N/A 02/17/2018   Procedure: HEMORRHOIDECTOMY;  Surgeon: Robert Bellow, MD;  Location: [facility] ORS;  Service: General;  Laterality: N/A;   JOINT REPLACEMENT     BILATERAL KNEE REPLACEMENTS   KNEE ARTHROSCOPY W/ OATS PROCEDURE     Lt knee (9/01), Rt knee (3/11), Lt hip (5/10)   LEFT HEART CATH AND CORONARY ANGIOGRAPHY N/A 03/31/2018   Procedure: LEFT HEART CATH AND CORONARY ANGIOGRAPHY;  Surgeon: Wellington Hampshire, MD;  Location: [place] [facility] LAB;  Service: Cardiovascular;  Laterality: N/A;   TOTAL HIP ARTHROPLASTY      Allergies  Allergen Reactions   Beta Adrenergic Blockers Shortness Of Breath    DEPRESSION/HYPOTENSION   Brilinta [Ticagrelor] Shortness Of Breath    Caused AFIB   Albuterol     rigors   Antihistamines, Diphenhydramine-Type     Muscle spasms   Aspirin Other (See Comments)    Heartburn    Nsaids Other (See Comments)    ULCER HISTORY & CURRENTLY ON BLOOD THINNERS   Penicillins Hives and Other (See Comments)    Has patient had a PCN reaction causing immediate rash, facial/tongue/throat swelling, SOB or lightheadedness with hypotension: No Has patient had a PCN reaction causing severe rash involving mucus membranes or skin necrosis: No Has patient had a PCN reaction that required hospitalization No Has patient had a PCN reaction occurring within the last 10 years: No If all of the above answers are "NO", then may proceed with Cephalosporin use.    Statins Other (See Comments)    Muscle weakness   Sulfasalazine Rash    At mouth   Tetracycline Rash   Tetracyclines & Related Rash    Outpatient Encounter Medications as of 08/08/2022  Medication Sig   acetaminophen (TYLENOL) 325 MG tablet Take 650 mg by  mouth every 4 (four) hours as needed for mild pain or moderate pain.   acetaminophen (TYLENOL) 500 MG tablet Take 1,000 mg by mouth 2 (two) times daily.   aluminum-magnesium hydroxide-simethicone (MAALOX) 017-510-25 MG/5ML SUSP 30 mLs.  Give 2 Tbsp by mouth every 4 hours as needed for gas, indigestion, or upset stomach supervised self-administration Notify MD if no relief   amitriptyline (ELAVIL) 25 MG tablet TAKE 1 TABLET BY MOUTH AT BEDTIME   ATROVENT HFA 17 MCG/ACT inhaler INHALE 2 PUFFS INTO THE LUNGS EVERY 6 HOURS AS NEEDED FOR WHEEZING   b complex vitamins tablet Take 1 tablet by mouth daily.   bismuth subsalicylate (QC PINK BISMUTH) 262 MG/15ML suspension Take 10 mLs by mouth as directed. Every 24 hours as needed for loose stools   Cholecalciferol (VITAMIN D3) 2000 UNITS TABS Take 1,000 Units by mouth 2 (two) times a week. Tuesday and Saturdays.   Coenzyme Q10 (COQ-10) 200 MG CAPS Take 200 mg by mouth daily.    conjugated estrogens (PREMARIN) vaginal cream Apply 0.42m (pea-sized amount)  just inside the vaginal introitus with a finger-tip on  Monday, Wednesday and Friday nights.   CRANBERRY PO Take 1 capsule by mouth 4 (four) times daily.   dextromethorphan-guaiFENesin (ROBITUSSIN-DM) 10-100 MG/5ML liquid Take 10 mLs by mouth every 4 (four) hours as needed for cough.   ELIQUIS 5 MG TABS tablet TAKE 1 TABLET BY MOUTH TWICE DAILY   Evolocumab (REPATHA SURECLICK) 1852MG/ML SOAJ Inject 140 mg into the skin every 14 (fourteen) days. PLEASE SCHEDULE OFFICE VISIT FOR FURTHER REFILLS. SECOND ATTEMPT   ezetimibe (ZETIA) 10 MG tablet TAKE 1 TABLET BY MOUTH DAILY   furosemide (LASIX) 40 MG tablet Take 40 mg by mouth daily.   hydrocortisone 2.5 % cream APPLY TO LICHEN PLANUS AS DIRECTED   ketoconazole (NIZORAL) 2 % cream Apply 1 Application topically daily as needed for irritation (for bleeding fingers).   lisinopril (ZESTRIL) 10 MG tablet Take 10 mg by mouth daily.   loratadine (CLARITIN) 10 MG  tablet Take 10 mg by mouth daily.   miconazole (MICOTIN) 2 % powder Apply 1 Application topically as directed. Apply to vagina every 24 hours as need for vaginal itching  miconazole (MONISTAT 7) 2 % vaginal cream Place 1 Applicatorful vaginally as directed. Every 24 hours as needed for lichen planus   Multiple Vitamins-Minerals (HAIR/SKIN/NAILS PO) Take 1 tablet by mouth daily.   ondansetron (ZOFRAN) 4 MG tablet Take 4 mg by mouth every 8 (eight) hours as needed for nausea or vomiting.   pantoprazole (PROTONIX) 40 MG tablet TAKE 1 TABLET BY MOUTH TWICE DAILY   polyethylene glycol (MIRALAX / GLYCOLAX) 17 g packet Take 17 g by mouth daily. In the evening for bowel regimen   Protein (PROSOURCE PO) Take 1 Dose by mouth 2 (two) times daily. For wound healing   silver sulfADIAZINE (SILVADENE) 1 % cream Apply 1 Application topically 2 (two) times daily. For wound care   spironolactone (ALDACTONE) 25 MG tablet Take 1 tablet (25 mg total) by mouth every morning.   traMADol (ULTRAM) 50 MG tablet TAKE ONE TABLET TWICE DAILY AS NEEDED   traZODone (DESYREL) 150 MG tablet Two tablets by mouth at bedtime as needed for sleep   triamcinolone cream (KENALOG) 0.1 % Apply 1 Application topically every 12 (twelve) hours as needed (for rash).   verapamil (CALAN-SR) 180 MG CR tablet TAKE ONE (1) TABLET BY MOUTH TWO TIMES PER DAY   vitamin B-12 (CYANOCOBALAMIN) 1000 MCG tablet Take 1,000 mcg by mouth daily.   vitamin C (ASCORBIC ACID) 500 MG tablet Take 500 mg by mouth at bedtime.   [DISCONTINUED] FEROSUL 325 (65 Fe) MG tablet TAKE ONE TABLET EVERY DAY   [DISCONTINUED] ketoconazole (NIZORAL) 2 % cream Apply 1 application topically daily. (Patient taking differently: Apply 1 application  topically daily as needed (For bleeding fingers).)   [DISCONTINUED] lisinopril (ZESTRIL) 20 MG tablet Take 20 mg by mouth daily.   [DISCONTINUED] UNABLE TO FIND Med Name: Glucose oral gel 15GM/32ML give 32 mL by mouth every 24 hours as  needed for low blood sugar   No facility-administered encounter medications on file as of 08/08/2022.    Review of Systems  All other systems reviewed and are negative.   Immunization History  Administered Date(s) Administered   Fluad Quad(high Dose 65+) 06/22/2021   Influenza, High Dose Seasonal PF 06/14/2016, 07/08/2018, 07/16/2019, 07/24/2022   Influenza,inj,Quad PF,6+ Mos 06/15/2014   Influenza-Unspecified 07/13/2015   Moderna Covid-19 Vaccine Bivalent Booster 57yr & up 06/30/2021   Moderna Sars-Covid-2 Vaccination 10/22/2019, 11/19/2019, 08/19/2020, 02/24/2021   Pneumococcal Conjugate-13 01/13/2015   Pneumococcal Polysaccharide-23 06/21/2009   Tdap 02/22/2010   Zoster, Live 11/15/2010   Pertinent  Health Maintenance Due  Topic Date Due   MAMMOGRAM  09/07/2022 (Originally 12/18/2019)   DEXA SCAN  09/07/2022 (Originally 04/08/2003)   COLONOSCOPY (Pts 45-447yrInsurance coverage will need to be confirmed)  03/19/2025   INFLUENZA VACCINE  Completed      06/21/2022    3:16 AM 06/21/2022    9:33 AM 06/21/2022    8:00 PM 06/22/2022   10:00 AM 06/27/2022   11:15 AM  Fall Risk  Patient Fall Risk Level _0   Patient at Risk for Falls Due to     History of fall(s)  Fall risk Follow up     Falls evaluation completed   Functional Status Survey:    Vitals:   08/08/22 0915  BP: 99/63  Pulse: 83  Resp: 20  Temp: (!) 96.3 F (35.7 C)  SpO2: 92%  Weight: 178 lb 6.4 oz (80.9 kg)  Height: _1  (1.575 m)  Body mass index is 32.63 kg/m. Physical Exam Vitals reviewed.  Cardiovascular:     Rate and Rhythm: Normal rate. Rhythm irregular.     Pulses: Normal pulses.  Pulmonary:     Effort: Pulmonary effort is normal.     Breath sounds: Normal breath sounds.  Abdominal:     General: Bowel sounds are normal.  Skin:    General: Skin is warm and dry.  Neurological:     Mental Status: She is alert and oriented  to person, place, and time.     Comments: Difficulty recalling details from previous visit     Labs reviewed: Recent Labs    05/10/22 1532 06/20/22 1831 06/22/22 0534  NA 138 137 136  K 4.2 4.3 4.1  CL 100 101 104  CO2 _0 GLUCOSE 147* 108* 146*  BUN 31* 26* 21  CREATININE 1.33* 1.21* 0.88  CALCIUM 10.3 9.7 9.8  MG  --   --  2.3   Recent Labs    01/11/22 0000 05/10/22 1532 06/20/22 2136  AST _1 ALT _2 ALKPHOS 46 50 54  BILITOT  --  0.5 1.0  PROT  --  6.6 6.9  ALBUMIN 4.5 4.4 4.2   Recent Labs    05/10/22 1532 06/20/22 1831 06/21/22 0835  WBC 5.5 6.5 6.4  NEUTROABS 3.9  --   --   HGB 13.0 12.2 12.9  HCT 38.7 37.8 38.8  MCV 103.3* 104.1* 101.0*  PLT 242.0 224 233   Lab Results  Component Value Date   TSH 1.58 01/11/2022   Lab Results  Component Value Date   HGBA1C 6.1 05/10/2022   Lab Results  Component Value Date   CHOL 121 05/10/2022   HDL 56.80 05/10/2022   LDLCALC 41 05/10/2022   LDLDIRECT 126.7 07/03/2013   TRIG 120.0 05/10/2022   CHOLHDL 2 05/10/2022    Significant Diagnostic Results in last 30 days:  No results found.  Assessment/Plan 1. Unsteadiness Patient was admitted to [facility] for therapy from AL due to weakness. Limited progression due to rectal prolapse and declining working with therapy. Discussed patient's quality of life, and rectal prolapse is a contributing factor to her overall perspective.  2. Rectal prolapse Patient is not a candidate for surgery. There are minimal conservative management options for a patient with her degree of prolapse. Discussed this with the patient. She is sad, and has developed a way to keep the prolapse in place for 4 hour increments. Continue to support patient during this challenging time. Continue preparation H and hydrocortisone 2% self administration. Continue PRN miralax   3. Chronic diastolic heart failure ([place]) Patient with bilateral pedal edema. Some weaping of the  bilateral lower extremities. Will plan to start unna boots of bilateral lower extremities. Continue Lasix 40 mg daily.   4. Persistent atrial fibrillation (HCC) Patient's rate well-controlled on current regimen. Will continue apixiban at 2.5 mg BID based on patient's age and CKD 3.   5. Peripheral arterial disease ([place]) Outlined above under CHF  6. Gastroesophageal reflux disease with esophagitis without hemorrhage Well-controlled symptoms on protonix 40 mg daily. Patient is not interested in dose reduction.   7. Lichen planus Patient continues to have symptoms. Continue topical estrogen.  8. Stage 3a chronic kidney disease (HCC) Cr stable at 1.14, eGFR 47.   9. Major depressive disorder in partial remission, unspecified whether recurrent [place]) Patient states elavil  25 mg is the best treatment for her  mood management. Continue trazodone 150 mg as this is also helpful for her mood.   10. Seasional allergies Discussed concern for patient's polypharmacy and impact of medications on her overall health. Declined decreasing dose of claritin to 10 mg daily.   11. HTN BP well-controlled on zestril, spironolactone and lasix-- discussed possible need for decreasing medications given her current BP. Patient denies symptoms and requested we keep all medications the same.   Family/ staff Communication: nursing  Labs/tests ordered:  CBC amd Meraux, MD, [facility] [phone]

## 2022-08-10 DIAGNOSIS — R278 Other lack of coordination: Secondary | ICD-10-CM | POA: Diagnosis not present

## 2022-08-10 DIAGNOSIS — M6281 Muscle weakness (generalized): Secondary | ICD-10-CM | POA: Diagnosis not present

## 2022-08-10 DIAGNOSIS — M48062 Spinal stenosis, lumbar region with neurogenic claudication: Secondary | ICD-10-CM | POA: Diagnosis not present

## 2022-08-10 DIAGNOSIS — N1831 Chronic kidney disease, stage 3a: Secondary | ICD-10-CM | POA: Diagnosis not present

## 2022-08-10 DIAGNOSIS — I5032 Chronic diastolic (congestive) heart failure: Secondary | ICD-10-CM | POA: Diagnosis not present

## 2022-08-10 DIAGNOSIS — M48061 Spinal stenosis, lumbar region without neurogenic claudication: Secondary | ICD-10-CM | POA: Diagnosis not present

## 2022-08-13 DIAGNOSIS — I5032 Chronic diastolic (congestive) heart failure: Secondary | ICD-10-CM | POA: Diagnosis not present

## 2022-08-13 DIAGNOSIS — R278 Other lack of coordination: Secondary | ICD-10-CM | POA: Diagnosis not present

## 2022-08-13 DIAGNOSIS — M6281 Muscle weakness (generalized): Secondary | ICD-10-CM | POA: Diagnosis not present

## 2022-08-13 DIAGNOSIS — M48062 Spinal stenosis, lumbar region with neurogenic claudication: Secondary | ICD-10-CM | POA: Diagnosis not present

## 2022-08-13 DIAGNOSIS — M48061 Spinal stenosis, lumbar region without neurogenic claudication: Secondary | ICD-10-CM | POA: Diagnosis not present

## 2022-08-13 DIAGNOSIS — N1831 Chronic kidney disease, stage 3a: Secondary | ICD-10-CM | POA: Diagnosis not present

## 2022-08-15 DIAGNOSIS — N390 Urinary tract infection, site not specified: Secondary | ICD-10-CM | POA: Diagnosis not present

## 2022-08-15 DIAGNOSIS — R278 Other lack of coordination: Secondary | ICD-10-CM | POA: Diagnosis not present

## 2022-08-15 DIAGNOSIS — M6281 Muscle weakness (generalized): Secondary | ICD-10-CM | POA: Diagnosis not present

## 2022-08-15 DIAGNOSIS — I5032 Chronic diastolic (congestive) heart failure: Secondary | ICD-10-CM | POA: Diagnosis not present

## 2022-08-15 DIAGNOSIS — M48061 Spinal stenosis, lumbar region without neurogenic claudication: Secondary | ICD-10-CM | POA: Diagnosis not present

## 2022-08-15 DIAGNOSIS — M48062 Spinal stenosis, lumbar region with neurogenic claudication: Secondary | ICD-10-CM | POA: Diagnosis not present

## 2022-08-15 DIAGNOSIS — N1831 Chronic kidney disease, stage 3a: Secondary | ICD-10-CM | POA: Diagnosis not present

## 2022-08-17 DIAGNOSIS — Z23 Encounter for immunization: Secondary | ICD-10-CM | POA: Diagnosis not present

## 2022-08-22 DIAGNOSIS — M48062 Spinal stenosis, lumbar region with neurogenic claudication: Secondary | ICD-10-CM | POA: Diagnosis not present

## 2022-08-22 DIAGNOSIS — M48061 Spinal stenosis, lumbar region without neurogenic claudication: Secondary | ICD-10-CM | POA: Diagnosis not present

## 2022-08-22 DIAGNOSIS — M6281 Muscle weakness (generalized): Secondary | ICD-10-CM | POA: Diagnosis not present

## 2022-08-22 DIAGNOSIS — R278 Other lack of coordination: Secondary | ICD-10-CM | POA: Diagnosis not present

## 2022-08-22 DIAGNOSIS — N1831 Chronic kidney disease, stage 3a: Secondary | ICD-10-CM | POA: Diagnosis not present

## 2022-08-22 DIAGNOSIS — I5032 Chronic diastolic (congestive) heart failure: Secondary | ICD-10-CM | POA: Diagnosis not present

## 2022-08-23 DIAGNOSIS — R278 Other lack of coordination: Secondary | ICD-10-CM | POA: Diagnosis not present

## 2022-08-23 DIAGNOSIS — M48062 Spinal stenosis, lumbar region with neurogenic claudication: Secondary | ICD-10-CM | POA: Diagnosis not present

## 2022-08-23 DIAGNOSIS — N1831 Chronic kidney disease, stage 3a: Secondary | ICD-10-CM | POA: Diagnosis not present

## 2022-08-23 DIAGNOSIS — M6281 Muscle weakness (generalized): Secondary | ICD-10-CM | POA: Diagnosis not present

## 2022-08-23 DIAGNOSIS — M48061 Spinal stenosis, lumbar region without neurogenic claudication: Secondary | ICD-10-CM | POA: Diagnosis not present

## 2022-08-23 DIAGNOSIS — I5032 Chronic diastolic (congestive) heart failure: Secondary | ICD-10-CM | POA: Diagnosis not present

## 2022-08-24 DIAGNOSIS — M6281 Muscle weakness (generalized): Secondary | ICD-10-CM | POA: Diagnosis not present

## 2022-08-24 DIAGNOSIS — N1831 Chronic kidney disease, stage 3a: Secondary | ICD-10-CM | POA: Diagnosis not present

## 2022-08-24 DIAGNOSIS — I5032 Chronic diastolic (congestive) heart failure: Secondary | ICD-10-CM | POA: Diagnosis not present

## 2022-08-24 DIAGNOSIS — M48061 Spinal stenosis, lumbar region without neurogenic claudication: Secondary | ICD-10-CM | POA: Diagnosis not present

## 2022-08-24 DIAGNOSIS — M48062 Spinal stenosis, lumbar region with neurogenic claudication: Secondary | ICD-10-CM | POA: Diagnosis not present

## 2022-08-24 DIAGNOSIS — R278 Other lack of coordination: Secondary | ICD-10-CM | POA: Diagnosis not present

## 2022-08-27 ENCOUNTER — Encounter: Payer: Self-pay | Admitting: Student

## 2022-08-27 ENCOUNTER — Non-Acute Institutional Stay (SKILLED_NURSING_FACILITY): Payer: Medicare Other | Admitting: Student

## 2022-08-27 DIAGNOSIS — M48061 Spinal stenosis, lumbar region without neurogenic claudication: Secondary | ICD-10-CM | POA: Diagnosis not present

## 2022-08-27 DIAGNOSIS — I5032 Chronic diastolic (congestive) heart failure: Secondary | ICD-10-CM | POA: Diagnosis not present

## 2022-08-27 DIAGNOSIS — M48062 Spinal stenosis, lumbar region with neurogenic claudication: Secondary | ICD-10-CM | POA: Diagnosis not present

## 2022-08-27 DIAGNOSIS — K623 Rectal prolapse: Secondary | ICD-10-CM

## 2022-08-27 DIAGNOSIS — R278 Other lack of coordination: Secondary | ICD-10-CM | POA: Diagnosis not present

## 2022-08-27 DIAGNOSIS — R296 Repeated falls: Secondary | ICD-10-CM

## 2022-08-27 DIAGNOSIS — M6281 Muscle weakness (generalized): Secondary | ICD-10-CM | POA: Diagnosis not present

## 2022-08-27 DIAGNOSIS — N1831 Chronic kidney disease, stage 3a: Secondary | ICD-10-CM | POA: Diagnosis not present

## 2022-08-27 NOTE — Progress Notes (Signed)
Location:  [place] Room Number: DTOIZ 124 Place of Service:  SNF [facility] [place] [address] Provider:  Dr. Amada Kingfisher, MD  Patient Care Team: Dewayne Shorter, MD as PCP - General (Family Medicine) Wende Bushy, MD as Consulting Physician (Cardiology) Clent Jacks, RN as Registered Nurse Rockey Situ, Kathlene November, MD as Consulting Physician (Cardiology)  Extended Emergency Contact Information Primary Emergency Contact: [place] Phone: [phone] Relation: Daughter Secondary Emergency Contact: Doroteo [place] of [place] Phone: [phone] Relation: Son  Code Status:  DNR Goals of care: Advanced Directive information    08/08/2022    9:15 AM  Advanced Directives  Does Patient Have a Medical Advance Directive? Yes  Type of Advance Directive Out of facility DNR (pink MOST or yellow form)  Does patient want to make changes to medical advance directive? No - Patient declined  Pre-existing out of facility DNR order (yellow form or pink MOST form) Yellow form placed in chart (order not valid for inpatient use)     Chief Complaint  Patient presents with   Acute Visit    Fall   HPI:  Pt is a 86 y.o. female seen today for an acute visit for  Fall. She did not have symptoms at that time.   She is feeling less and less   Bowel movements are what she is not having at this  She wishes the lord would take her. She feels like she is good for nothing. She is causing a lot of angst. Her son comes Wednesday for thanksgiving. Yesterday she was going to eat in the dining room with her kids. She sat in the wheelchair for 4 hours during their visit.   OT came during visit and discussed possiblity of having undergarment that can help with rectal prolapse. Patient agreed to try this   Past Medical History:  Diagnosis Date   Acute GI bleeding 03/16/2020   Acute on chronic diastolic CHF  (congestive heart failure) ([place]) 10/08/2016   Allergy    Arthritis    s/p bilateral knees and left hip replacement   Asthma    Benign essential hypertension 06/21/2013   Formatting of this note might be different from the original. Last Assessment & Plan:  Blood pressure doing well.  Follow.  Check metabolic panel.   CAD (coronary artery disease)    a. 12/1996 s/p CABG x4 ([place]);  b. 2005 Pt reports stress test & cath, which revealed patent grafts.   Cancer [facility])    melanoma right arm   Carotid arterial disease ([place])    a. 04/2015 Carotid U/S: <50% bilat ICA stenosis.   Chronic kidney disease    Colon polyps    H/O   Depression    GERD (gastroesophageal reflux disease)    h/o hiatal hernia   Headache    migraines in past   Heart murmur    a. 04/2011 Echo: EF 55-60%, bilat atrial enlargement, mild to mod TR.   History of chicken pox    History of hiatal hernia    Hx of migraines    rare now   Hx: UTI (urinary tract infection)    Hyperlipidemia    a. Statin intolerant -->on zetia.   Hypertension    Hypertensive heart disease    Lichen planus    Melanoma (HCC)    Palpitations    a. rare PVC's and h/o SVT.   PMR (polymyalgia rheumatica) (HCC)  h/o in setting of crestor usage.   PSVT (paroxysmal supraventricular tachycardia)    PUD (peptic ulcer disease)    remote history   Raynaud's phenomenon    Spinal stenosis    Urine incontinence    H/O   Vitamin D deficiency    Past Surgical History:  Procedure Laterality Date   ADENOIDECTOMY     age 37   BACK SURGERY     L3-L5   BILATERAL CARPAL TUNNEL RELEASE     BREAST BIOPSY Right    bx x 3 neg   BREAST SURGERY Right    biopsy x 3 (all benign)   CARDIAC CATHETERIZATION N/A 06/01/2016   Procedure: LEFT HEART CATH AND CORS/GRAFTS ANGIOGRAPHY;  Surgeon: Wellington Hampshire, MD;  Location: [place] CV LAB;  Service: Cardiovascular;  Laterality: N/A;   CARDIAC CATHETERIZATION N/A 06/01/2016   Procedure: Coronary Stent  Intervention;  Surgeon: Wellington Hampshire, MD;  Location: [place] CV LAB;  Service: Cardiovascular;  Laterality: N/A;   CARDIOVERSION N/A 03/08/2017   Procedure: CARDIOVERSION;  Surgeon: Wellington Hampshire, MD;  Location: [facility] ORS;  Service: Cardiovascular;  Laterality: N/A;   CATARACT EXTRACTION W/PHACO Right 01/04/2016   Procedure: CATARACT EXTRACTION PHACO AND INTRAOCULAR LENS PLACEMENT ([place]);  Surgeon: Leandrew Koyanagi, MD;  Location: [place];  Service: Ophthalmology;  Laterality: Right;  MALYUGIN   CATARACT EXTRACTION W/PHACO Left 01/25/2016   Procedure: CATARACT EXTRACTION PHACO AND INTRAOCULAR LENS PLACEMENT ([place]) left eye;  Surgeon: Leandrew Koyanagi, MD;  Location: [place];  Service: Ophthalmology;  Laterality: Left;  MALYUGIN SHUGARCAINE   CHOLECYSTECTOMY  90's   COLONOSCOPY  03/19/2020   Procedure: COLONOSCOPY;  Surgeon: Virgel Manifold, MD;  Location: [facility] ENDOSCOPY;  Service: Gastroenterology;;   COLONOSCOPY WITH PROPOFOL N/A 05/03/2015   Procedure: COLONOSCOPY WITH PROPOFOL;  Surgeon: Lollie Sails, MD;  Location: [facility] ENDOSCOPY;  Service: Endoscopy;  Laterality: N/A;   COLONOSCOPY WITH PROPOFOL N/A 03/19/2020   Procedure: COLONOSCOPY WITH PROPOFOL;  Surgeon: Virgel Manifold, MD;  Location: [facility] ENDOSCOPY;  Service: Endoscopy;  Laterality: N/A;   CORONARY ARTERY BYPASS GRAFT  98   4 vessel   CORONARY ARTERY BYPASS GRAFT     x 4 age 86 per pt    CORONARY STENT INTERVENTION N/A 03/31/2018   Procedure: CORONARY STENT INTERVENTION;  Surgeon: Wellington Hampshire, MD;  Location: [place] CV LAB;  Service: Cardiovascular;  Laterality: N/A;   ESOPHAGOGASTRODUODENOSCOPY N/A 03/22/2015   Procedure: ESOPHAGOGASTRODUODENOSCOPY (EGD);  Surgeon: Lollie Sails, MD;  Location: [facility] ENDOSCOPY;  Service: Endoscopy;  Laterality: N/A;   ESOPHAGOGASTRODUODENOSCOPY N/A 03/19/2020   Procedure: ESOPHAGOGASTRODUODENOSCOPY (EGD);  Surgeon: Virgel Manifold,  MD;  Location: [facility] ENDOSCOPY;  Service: Endoscopy;  Laterality: N/A;   HEMORRHOID BANDING     HEMORRHOID SURGERY N/A 02/17/2018   Procedure: HEMORRHOIDECTOMY;  Surgeon: Robert Bellow, MD;  Location: [facility] ORS;  Service: General;  Laterality: N/A;   JOINT REPLACEMENT     BILATERAL KNEE REPLACEMENTS   KNEE ARTHROSCOPY W/ OATS PROCEDURE     Lt knee (9/01), Rt knee (3/11), Lt hip (5/10)   LEFT HEART CATH AND CORONARY ANGIOGRAPHY N/A 03/31/2018   Procedure: LEFT HEART CATH AND CORONARY ANGIOGRAPHY;  Surgeon: Wellington Hampshire, MD;  Location: [place] CV LAB;  Service: Cardiovascular;  Laterality: N/A;   TOTAL HIP ARTHROPLASTY      Allergies  Allergen Reactions   Beta Adrenergic Blockers Shortness Of Breath    DEPRESSION/HYPOTENSION   Brilinta [Ticagrelor]  Shortness Of Breath    Caused AFIB   Albuterol     rigors   Antihistamines, Diphenhydramine-Type     Muscle spasms   Aspirin Other (See Comments)    Heartburn    Nsaids Other (See Comments)    ULCER HISTORY & CURRENTLY ON BLOOD THINNERS   Penicillins Hives and Other (See Comments)    Has patient had a PCN reaction causing immediate rash, facial/tongue/throat swelling, SOB or lightheadedness with hypotension: No Has patient had a PCN reaction causing severe rash involving mucus membranes or skin necrosis: No Has patient had a PCN reaction that required hospitalization No Has patient had a PCN reaction occurring within the last 10 years: No If all of the above answers are "NO", then may proceed with Cephalosporin use.    Statins Other (See Comments)    Muscle weakness   Sulfasalazine Rash    At mouth   Tetracycline Rash   Tetracyclines & Related Rash    Outpatient Encounter Medications as of 08/27/2022  Medication Sig   acetaminophen (TYLENOL) 325 MG tablet Take 650 mg by mouth every 4 (four) hours as needed for mild pain or moderate pain.   acetaminophen (TYLENOL) 500 MG tablet Take 1,000 mg by mouth 2 (two) times  daily.   aluminum-magnesium hydroxide-simethicone (MAALOX) 109-323-55 MG/5ML SUSP 30 mLs.  Give 2 Tbsp by mouth every 4 hours as needed for gas, indigestion, or upset stomach supervised self-administration Notify MD if no relief   amitriptyline (ELAVIL) 25 MG tablet TAKE 1 TABLET BY MOUTH AT BEDTIME   apixaban (ELIQUIS) 2.5 MG TABS tablet Take 1 tablet (2.5 mg total) by mouth 2 (two) times daily.   ATROVENT HFA 17 MCG/ACT inhaler INHALE 2 PUFFS INTO THE LUNGS EVERY 6 HOURS AS NEEDED FOR WHEEZING   b complex vitamins tablet Take 1 tablet by mouth daily.   bismuth subsalicylate (QC PINK BISMUTH) 262 MG/15ML suspension Take 10 mLs by mouth as directed. Every 24 hours as needed for loose stools   Cholecalciferol (VITAMIN D3) 2000 UNITS TABS Take 1,000 Units by mouth 2 (two) times a week. Tuesday and Saturdays.   Coenzyme Q10 (COQ-10) 200 MG CAPS Take 200 mg by mouth daily.    conjugated estrogens (PREMARIN) vaginal cream Apply 0.'5mg'$  (pea-sized amount)  just inside the vaginal introitus with a finger-tip on  Monday, Wednesday and Friday nights.   CRANBERRY PO Take 1 capsule by mouth 4 (four) times daily.   dextromethorphan-guaiFENesin (ROBITUSSIN-DM) 10-100 MG/5ML liquid Take 10 mLs by mouth every 4 (four) hours as needed for cough.   Evolocumab (REPATHA SURECLICK) 732 MG/ML SOAJ Inject 140 mg into the skin every 14 (fourteen) days. PLEASE SCHEDULE OFFICE VISIT FOR FURTHER REFILLS. SECOND ATTEMPT   ezetimibe (ZETIA) 10 MG tablet TAKE 1 TABLET BY MOUTH DAILY   furosemide (LASIX) 40 MG tablet Take 40 mg by mouth daily.   hydrocortisone 2.5 % cream APPLY TO LICHEN PLANUS AS DIRECTED   ketoconazole (NIZORAL) 2 % cream Apply 1 Application topically daily as needed for irritation (for bleeding fingers).   lisinopril (ZESTRIL) 10 MG tablet Take 10 mg by mouth daily.   loratadine (CLARITIN) 10 MG tablet Take 10 mg by mouth daily.   miconazole (MICOTIN) 2 % powder Apply 1 Application topically as directed.  Apply to vagina every 24 hours as need for vaginal itching   miconazole (MONISTAT 7) 2 % vaginal cream Place 1 Applicatorful vaginally as directed. Every 24 hours as needed for lichen planus   Multiple  Vitamins-Minerals (HAIR/SKIN/NAILS PO) Take 1 tablet by mouth daily.   ondansetron (ZOFRAN) 4 MG tablet Take 4 mg by mouth every 8 (eight) hours as needed for nausea or vomiting.   pantoprazole (PROTONIX) 40 MG tablet TAKE 1 TABLET BY MOUTH TWICE DAILY   polyethylene glycol (MIRALAX / GLYCOLAX) 17 g packet Take 17 g by mouth daily. In the evening for bowel regimen   Protein (PROSOURCE PO) Take 1 Dose by mouth 2 (two) times daily. For wound healing   silver sulfADIAZINE (SILVADENE) 1 % cream Apply 1 Application topically 2 (two) times daily. For wound care   spironolactone (ALDACTONE) 25 MG tablet Take 1 tablet (25 mg total) by mouth every morning.   traMADol (ULTRAM) 50 MG tablet TAKE ONE TABLET TWICE DAILY AS NEEDED   traZODone (DESYREL) 150 MG tablet Two tablets by mouth at bedtime as needed for sleep   triamcinolone cream (KENALOG) 0.1 % Apply 1 Application topically every 12 (twelve) hours as needed (for rash).   verapamil (CALAN-SR) 180 MG CR tablet TAKE ONE (1) TABLET BY MOUTH TWO TIMES PER DAY   vitamin B-12 (CYANOCOBALAMIN) 1000 MCG tablet Take 1,000 mcg by mouth daily.   vitamin C (ASCORBIC ACID) 500 MG tablet Take 500 mg by mouth at bedtime.   No facility-administered encounter medications on file as of 08/27/2022.    Review of Systems  All other systems reviewed and are negative.   Immunization History  Administered Date(s) Administered   Fluad Quad(high Dose 65+) 06/22/2021   Influenza, High Dose Seasonal PF 06/14/2016, 07/08/2018, 07/16/2019, 07/24/2022   Influenza,inj,Quad PF,6+ Mos 06/15/2014   Influenza-Unspecified 07/13/2015   Moderna Covid-19 Vaccine Bivalent Booster 35yr & up 06/30/2021   Moderna Sars-Covid-2 Vaccination 10/22/2019, 11/19/2019, 08/19/2020, 02/24/2021    Pneumococcal Conjugate-13 01/13/2015   Pneumococcal Polysaccharide-23 06/21/2009   Tdap 02/22/2010   Zoster, Live 11/15/2010   Pertinent  Health Maintenance Due  Topic Date Due   MAMMOGRAM  09/07/2022 (Originally 12/18/2019)   DEXA SCAN  09/07/2022 (Originally 04/08/2003)   COLONOSCOPY (Pts 45-451yrInsurance coverage will need to be confirmed)  03/19/2025   INFLUENZA VACCINE  Completed      06/21/2022    3:16 AM 06/21/2022    9:33 AM 06/21/2022    8:00 PM 06/22/2022   10:00 AM 06/27/2022   11:15 AM  Fall Risk  Patient Fall Risk Level High fall risk High fall risk High fall risk High fall risk High fall risk  Patient at Risk for Falls Due to     History of fall(s)  Fall risk Follow up     Falls evaluation completed   Functional Status Survey:    Vitals:   08/27/22 1548  BP: 120/76  Pulse: 96  Temp: 98 F (36.7 C)  SpO2: 92%  Weight: 179 lb 12.8 oz (81.6 kg)  Height: '5\' 2"'$  (1.575 m)   Body mass index is 32.89 kg/m. Physical Exam Vitals reviewed.  Constitutional:      Appearance: Normal appearance.  Cardiovascular:     Rate and Rhythm: Normal rate.     Pulses: Normal pulses.  Pulmonary:     Effort: Pulmonary effort is normal.  Abdominal:     General: Bowel sounds are normal.     Palpations: Abdomen is soft.  Musculoskeletal:     Comments: Bilateral lower extremities with unna boots in place, trace pitting edema.   Neurological:     Mental Status: She is alert.     Labs reviewed: Recent Labs  05/10/22 1532 06/20/22 1831 06/22/22 0534  NA 138 137 136  K 4.2 4.3 4.1  CL 100 101 104  CO2 '30 25 23  '$ GLUCOSE 147* 108* 146*  BUN 31* 26* 21  CREATININE 1.33* 1.21* 0.88  CALCIUM 10.3 9.7 9.8  MG  --   --  2.3   Recent Labs    01/11/22 0000 05/10/22 1532 06/20/22 2136  AST '21 21 27  '$ ALT '20 18 21  '$ ALKPHOS 46 50 54  BILITOT  --  0.5 1.0  PROT  --  6.6 6.9  ALBUMIN 4.5 4.4 4.2   Recent Labs    05/10/22 1532 06/20/22 1831 06/21/22 0835  WBC 5.5  6.5 6.4  NEUTROABS 3.9  --   --   HGB 13.0 12.2 12.9  HCT 38.7 37.8 38.8  MCV 103.3* 104.1* 101.0*  PLT 242.0 224 233   Lab Results  Component Value Date   TSH 1.58 01/11/2022   Lab Results  Component Value Date   HGBA1C 6.1 05/10/2022   Lab Results  Component Value Date   CHOL 121 05/10/2022   HDL 56.80 05/10/2022   LDLCALC 41 05/10/2022   LDLDIRECT 126.7 07/03/2013   TRIG 120.0 05/10/2022   CHOLHDL 2 05/10/2022    Significant Diagnostic Results in last 30 days:  No results found.  Assessment/Plan 1. Recurrent falls Patient had fall yesterday (08/26/2022), non-injurious in nature. She denied symptoms at the time of the fall. Plan to reinstate physical therapy. Discussed medication changes such as decreasing BP meds, changing depression medication and patient declined all of these changes.   2. Rectal prolapse Patient has suffered with this chronic illness. Not a surgical candidate. Now open to trialing an undergarment that could hold her rectum in place for when she is with family-- it has negatively impacted her QOL that she cannot spend time with family due to discomfort with prolapse.     Family/ staff Communication: nursing  Labs/tests ordered:  none  Tomasa Rand, MD, [place] [phone]

## 2022-08-28 DIAGNOSIS — M48061 Spinal stenosis, lumbar region without neurogenic claudication: Secondary | ICD-10-CM | POA: Diagnosis not present

## 2022-08-28 DIAGNOSIS — M48062 Spinal stenosis, lumbar region with neurogenic claudication: Secondary | ICD-10-CM | POA: Diagnosis not present

## 2022-08-28 DIAGNOSIS — R278 Other lack of coordination: Secondary | ICD-10-CM | POA: Diagnosis not present

## 2022-08-28 DIAGNOSIS — I5032 Chronic diastolic (congestive) heart failure: Secondary | ICD-10-CM | POA: Diagnosis not present

## 2022-08-28 DIAGNOSIS — M6281 Muscle weakness (generalized): Secondary | ICD-10-CM | POA: Diagnosis not present

## 2022-08-28 DIAGNOSIS — N1831 Chronic kidney disease, stage 3a: Secondary | ICD-10-CM | POA: Diagnosis not present

## 2022-08-31 ENCOUNTER — Other Ambulatory Visit: Payer: Self-pay | Admitting: Cardiovascular Disease

## 2022-08-31 DIAGNOSIS — M48062 Spinal stenosis, lumbar region with neurogenic claudication: Secondary | ICD-10-CM | POA: Diagnosis not present

## 2022-08-31 DIAGNOSIS — R278 Other lack of coordination: Secondary | ICD-10-CM | POA: Diagnosis not present

## 2022-08-31 DIAGNOSIS — M6281 Muscle weakness (generalized): Secondary | ICD-10-CM | POA: Diagnosis not present

## 2022-08-31 DIAGNOSIS — I5032 Chronic diastolic (congestive) heart failure: Secondary | ICD-10-CM | POA: Diagnosis not present

## 2022-08-31 DIAGNOSIS — M48061 Spinal stenosis, lumbar region without neurogenic claudication: Secondary | ICD-10-CM | POA: Diagnosis not present

## 2022-08-31 DIAGNOSIS — N1831 Chronic kidney disease, stage 3a: Secondary | ICD-10-CM | POA: Diagnosis not present

## 2022-09-04 DIAGNOSIS — I5032 Chronic diastolic (congestive) heart failure: Secondary | ICD-10-CM | POA: Diagnosis not present

## 2022-09-04 DIAGNOSIS — R278 Other lack of coordination: Secondary | ICD-10-CM | POA: Diagnosis not present

## 2022-09-04 DIAGNOSIS — N1831 Chronic kidney disease, stage 3a: Secondary | ICD-10-CM | POA: Diagnosis not present

## 2022-09-04 DIAGNOSIS — M48061 Spinal stenosis, lumbar region without neurogenic claudication: Secondary | ICD-10-CM | POA: Diagnosis not present

## 2022-09-04 DIAGNOSIS — M6281 Muscle weakness (generalized): Secondary | ICD-10-CM | POA: Diagnosis not present

## 2022-09-04 DIAGNOSIS — M48062 Spinal stenosis, lumbar region with neurogenic claudication: Secondary | ICD-10-CM | POA: Diagnosis not present

## 2022-09-05 DIAGNOSIS — M48062 Spinal stenosis, lumbar region with neurogenic claudication: Secondary | ICD-10-CM | POA: Diagnosis not present

## 2022-09-05 DIAGNOSIS — R278 Other lack of coordination: Secondary | ICD-10-CM | POA: Diagnosis not present

## 2022-09-05 DIAGNOSIS — M6281 Muscle weakness (generalized): Secondary | ICD-10-CM | POA: Diagnosis not present

## 2022-09-05 DIAGNOSIS — M48061 Spinal stenosis, lumbar region without neurogenic claudication: Secondary | ICD-10-CM | POA: Diagnosis not present

## 2022-09-05 DIAGNOSIS — N1831 Chronic kidney disease, stage 3a: Secondary | ICD-10-CM | POA: Diagnosis not present

## 2022-09-05 DIAGNOSIS — I5032 Chronic diastolic (congestive) heart failure: Secondary | ICD-10-CM | POA: Diagnosis not present

## 2022-09-09 DIAGNOSIS — K623 Rectal prolapse: Secondary | ICD-10-CM | POA: Diagnosis not present

## 2022-09-09 DIAGNOSIS — N189 Chronic kidney disease, unspecified: Secondary | ICD-10-CM | POA: Diagnosis not present

## 2022-09-09 DIAGNOSIS — I2510 Atherosclerotic heart disease of native coronary artery without angina pectoris: Secondary | ICD-10-CM | POA: Insufficient documentation

## 2022-09-09 DIAGNOSIS — Z951 Presence of aortocoronary bypass graft: Secondary | ICD-10-CM | POA: Insufficient documentation

## 2022-09-09 DIAGNOSIS — I5032 Chronic diastolic (congestive) heart failure: Secondary | ICD-10-CM | POA: Insufficient documentation

## 2022-09-09 DIAGNOSIS — Z7901 Long term (current) use of anticoagulants: Secondary | ICD-10-CM | POA: Diagnosis not present

## 2022-09-10 ENCOUNTER — Emergency Department
Admission: EM | Admit: 2022-09-10 | Discharge: 2022-09-10 | Disposition: A | Discharge status: Home / Self Care | Payer: Medicare Other | Attending: Emergency Medicine | Admitting: Emergency Medicine

## 2022-09-10 ENCOUNTER — Other Ambulatory Visit: Payer: Self-pay

## 2022-09-10 DIAGNOSIS — K623 Rectal prolapse: Secondary | ICD-10-CM | POA: Diagnosis not present

## 2022-09-10 LAB — CBC WITH DIFFERENTIAL/PLATELET
Abs Immature Granulocytes: 0.12 10*3/uL — ABNORMAL HIGH (ref 0.00–0.07)
Basophils Absolute: 0 10*3/uL (ref 0.0–0.1)
Basophils Relative: 0 %
Eosinophils Absolute: 0 10*3/uL (ref 0.0–0.5)
Eosinophils Relative: 1 %
HCT: 41.7 % (ref 36.0–46.0)
Hemoglobin: 13.5 g/dL (ref 12.0–15.0)
Immature Granulocytes: 2 %
Lymphocytes Relative: 10 %
Lymphs Abs: 0.6 10*3/uL — ABNORMAL LOW (ref 0.7–4.0)
MCH: 34.4 pg — ABNORMAL HIGH (ref 26.0–34.0)
MCHC: 32.4 g/dL (ref 30.0–36.0)
MCV: 106.1 fL — ABNORMAL HIGH (ref 80.0–100.0)
Monocytes Absolute: 0.7 10*3/uL (ref 0.1–1.0)
Monocytes Relative: 11 %
Neutro Abs: 4.6 10*3/uL (ref 1.7–7.7)
Neutrophils Relative %: 76 %
Platelets: 217 10*3/uL (ref 150–400)
RBC: 3.93 MIL/uL (ref 3.87–5.11)
RDW: 13.2 % (ref 11.5–15.5)
WBC: 6.1 10*3/uL (ref 4.0–10.5)
nRBC: 0 % (ref 0.0–0.2)

## 2022-09-10 LAB — COMPREHENSIVE METABOLIC PANEL
ALT: 23 U/L (ref 0–44)
AST: 21 U/L (ref 15–41)
Albumin: 4 g/dL (ref 3.5–5.0)
Alkaline Phosphatase: 50 U/L (ref 38–126)
Anion gap: 7 (ref 5–15)
BUN: 66 mg/dL — ABNORMAL HIGH (ref 8–23)
CO2: 26 mmol/L (ref 22–32)
Calcium: 10.1 mg/dL (ref 8.9–10.3)
Chloride: 103 mmol/L (ref 98–111)
Creatinine, Ser: 1.13 mg/dL — ABNORMAL HIGH (ref 0.44–1.00)
GFR, Estimated: 48 mL/min — ABNORMAL LOW (ref >60)
Glucose, Bld: 184 mg/dL — ABNORMAL HIGH (ref 70–99)
Potassium: 4.4 mmol/L (ref 3.5–5.1)
Sodium: 136 mmol/L (ref 135–145)
Total Bilirubin: 0.8 mg/dL (ref 0.3–1.2)
Total Protein: 7.2 g/dL (ref 6.5–8.1)

## 2022-09-10 LAB — PROTIME-INR
INR: 1.4 — ABNORMAL HIGH (ref 0.8–1.2)
Prothrombin Time: 17.4 seconds — ABNORMAL HIGH (ref 11.4–15.2)

## 2022-09-10 NOTE — ED Triage Notes (Signed)
Patient reports history of rectal prolapse. States she is normally able to push rectum back in but states she was unable to push rectum back in after multiple attempts. AOX4. Resp even, unlabored on RA.

## 2022-09-10 NOTE — ED Notes (Signed)
First nurse note: Arrived via EMS from [place] for prolapsed rectum. History of same. Takes eliquis.

## 2022-09-10 NOTE — ED Provider Notes (Signed)
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  Patient reports that she has been dealing with intermittent rectal prolapse for at least the past 3 years.  She is concerned because it is happening more frequently lately, although she reports being able to push it back in when prolapse does occur.  She reports occasional discomfort in her rectal area with some bleeding, denies any abdominal pain, nausea, or vomiting.  She is concerned today because she initially had some difficulty reducing the prolapse on her own.  She states she has been seen by general surgery for this problem previously and told she is not a candidate for surgery.  She is currently unsure whether her rectum remains prolapsed.     Physical Exam   Triage Vital Signs: ED Triage Vitals  Enc Vitals Group     BP 09/10/22 0019 108/72     Pulse Rate 09/10/22 0019 85     Resp 09/10/22 0019 20     Temp 09/10/22 0019 97.6 F (36.4 C)     Temp Source 09/10/22 0019 Oral     SpO2 09/10/22 0019 100 %     Weight 09/10/22 0020 171 lb (77.6 kg)     Height 09/10/22 0020 '5\' 2"'$  (1.575 m)     Head Circumference --      Peak Flow --      Pain Score 09/10/22 0020 3     Pain Loc --      Pain Edu? --      Excl. in [place]? --     Most recent vital signs: Vitals:   09/10/22 0407 09/10/22 0752  BP: 123/60 120/68  Pulse: 100 98  Resp: 20 20  Temp:  98 F (36.7 C)  SpO2: 98% 98%    Constitutional: Alert and oriented. Eyes: Conjunctivae are normal. Head: Atraumatic. Nose: No congestion/rhinnorhea. Mouth/Throat: Mucous membranes are moist.  Cardiovascular: Normal rate, regular  rhythm. Grossly normal heart sounds.  2+ radial pulses bilaterally. Respiratory: Normal respiratory effort.  No retractions. Lungs CTAB. Gastrointestinal: Soft and nontender. No distention.  Rectal exam with hemorrhoids but no evidence of prolapse, no bleeding noted. Musculoskeletal: No lower extremity tenderness nor edema.  Neurologic:  Normal speech and language. No gross focal neurologic deficits are appreciated.    ED Results / Procedures / Treatments   Labs (all labs ordered are listed, but only abnormal results are displayed) Labs Reviewed  CBC WITH DIFFERENTIAL/PLATELET - Abnormal; Notable for the following components:      Result Value   MCV 106.1 (*)    MCH 34.4 (*)    Lymphs Abs 0.6 (*)    Abs Immature Granulocytes 0.12 (*)    All other components within normal limits  COMPREHENSIVE METABOLIC PANEL - Abnormal; Notable for the following components:   Glucose, Bld 184 (*)    BUN 66 (*)    Creatinine, Ser 1.13 (*)    GFR, Estimated 48 (*)    All other components within normal limits  PROTIME-INR - Abnormal; Notable for the following components:   Prothrombin Time 17.4 (*)    INR 1.4 (*)  All other components within normal limits    PROCEDURES:  Critical Care performed: No  Procedures   MEDICATIONS ORDERED IN ED: Medications - No data to display   IMPRESSION / MDM / [place] / ED COURSE  I reviewed the triage vital signs and the nursing notes.                              86 y.o. female with past medical history of atrial fibrillation on Eliquis, CAD status post CABG, diastolic CHF, and CKD who presents to the ED with intermittent rectal prolapse for the past 3 years, increasing in frequency over the past few days, patient unsure whether prolapse remains today.  Patient's presentation is most consistent with acute presentation with potential threat to life or bodily function.  Differential diagnosis includes, but is not limited to, rectal  prolapse, GI bleed, anemia, electrolyte abnormality, AKI.  Patient nontoxic-appearing and in no acute distress, vital signs are unremarkable.  She has a benign abdominal exam and rectal exam with hemorrhoids but no ongoing rectal prolapse at this time.  Labs without significant anemia, leukocytosis, or electrolyte abnormality.  Patient does have mild elevation in creatinine, although has a history of chronic kidney disease and has had similar levels in the past.  Her BUN is elevated and this may be related to dehydration, no evidence of upper GI bleed.  Patient is tolerating oral intake without difficulty, was counseled to drink plenty of water and to follow-up with PCP for recheck of blood work.  She was also counseled to follow-up with general surgery for ongoing management of rectal prolapse, appears her PCP was discussing special undergarment to help prevent ongoing prolapse.  Patient counseled to return to the ED for new or worsening symptoms, including irreducible prolapse.  Patient agrees with plan.      FINAL CLINICAL IMPRESSION(S) / ED DIAGNOSES   Final diagnoses:  Rectal prolapse     Rx / DC Orders   ED Discharge Orders     None        Note:  This document was prepared using Dragon voice recognition software and may include unintentional dictation errors.   Blake Divine, MD 09/10/22 [phone]

## 2022-09-11 DIAGNOSIS — M48061 Spinal stenosis, lumbar region without neurogenic claudication: Secondary | ICD-10-CM | POA: Diagnosis not present

## 2022-09-11 DIAGNOSIS — Z741 Need for assistance with personal care: Secondary | ICD-10-CM | POA: Diagnosis not present

## 2022-09-11 DIAGNOSIS — M6281 Muscle weakness (generalized): Secondary | ICD-10-CM | POA: Diagnosis not present

## 2022-09-11 DIAGNOSIS — M48062 Spinal stenosis, lumbar region with neurogenic claudication: Secondary | ICD-10-CM | POA: Diagnosis not present

## 2022-09-11 DIAGNOSIS — R278 Other lack of coordination: Secondary | ICD-10-CM | POA: Diagnosis not present

## 2022-09-11 DIAGNOSIS — I5032 Chronic diastolic (congestive) heart failure: Secondary | ICD-10-CM | POA: Diagnosis not present

## 2022-09-11 DIAGNOSIS — N1831 Chronic kidney disease, stage 3a: Secondary | ICD-10-CM | POA: Diagnosis not present

## 2022-09-12 DIAGNOSIS — I5032 Chronic diastolic (congestive) heart failure: Secondary | ICD-10-CM | POA: Diagnosis not present

## 2022-09-12 DIAGNOSIS — N1831 Chronic kidney disease, stage 3a: Secondary | ICD-10-CM | POA: Diagnosis not present

## 2022-09-12 DIAGNOSIS — R278 Other lack of coordination: Secondary | ICD-10-CM | POA: Diagnosis not present

## 2022-09-12 DIAGNOSIS — M6281 Muscle weakness (generalized): Secondary | ICD-10-CM | POA: Diagnosis not present

## 2022-09-12 DIAGNOSIS — M48061 Spinal stenosis, lumbar region without neurogenic claudication: Secondary | ICD-10-CM | POA: Diagnosis not present

## 2022-09-12 DIAGNOSIS — M48062 Spinal stenosis, lumbar region with neurogenic claudication: Secondary | ICD-10-CM | POA: Diagnosis not present

## 2022-09-13 ENCOUNTER — Telehealth: Payer: Medicare Other | Admitting: Internal Medicine

## 2022-09-14 ENCOUNTER — Telehealth: Payer: Self-pay

## 2022-09-14 NOTE — Telephone Encounter (Signed)
        Patient  visited [facility]  on 12/4    Telephone encounter attempt :  1st  A HIPAA compliant voice message was left requesting a return call.  Instructed patient to call back.    [place], Care Management  [phone] [address], [place], [place] [address] Phone: [phone] Email: [email or web address]

## 2022-09-17 ENCOUNTER — Telehealth: Payer: Self-pay

## 2022-09-17 NOTE — Telephone Encounter (Signed)
        Patient  visited [place] on 12/4     Telephone encounter attempt :  2nd  A HIPAA compliant voice message was left requesting a return call.  Instructed patient to call back     [place], [facility]  [phone] [address], [place], [place] [address] Phone: [phone] Email: [email or web address]

## 2022-09-19 ENCOUNTER — Telehealth: Payer: Self-pay | Admitting: Cardiovascular Disease

## 2022-09-19 ENCOUNTER — Telehealth: Payer: Self-pay | Admitting: Internal Medicine

## 2022-09-19 NOTE — Telephone Encounter (Signed)
S/w Colletta [place] - verbal orders for cath ua and UCX given. Collet[facility] [place] will have results faxed to [place] Fax # provided

## 2022-09-19 NOTE — Telephone Encounter (Signed)
Called patient to schedule she states she does not need appt.

## 2022-09-19 NOTE — Telephone Encounter (Signed)
Ok to give order for urinalysis and culture.  Find out how they are going to send results to [place].  Thanks.

## 2022-09-19 NOTE — Telephone Encounter (Signed)
Colletta [place] called stating pt is having frequent urination and stephanie need permission to cath the pt in and out to get a sample

## 2022-09-20 ENCOUNTER — Ambulatory Visit (INDEPENDENT_AMBULATORY_CARE_PROVIDER_SITE_OTHER): Payer: Medicare Other | Admitting: Internal Medicine

## 2022-09-20 ENCOUNTER — Encounter: Payer: Self-pay | Admitting: Internal Medicine

## 2022-09-20 VITALS — BP 136/77 | Ht 63.0 in | Wt 171.0 lb

## 2022-09-20 DIAGNOSIS — E782 Mixed hyperlipidemia: Secondary | ICD-10-CM

## 2022-09-20 DIAGNOSIS — M48062 Spinal stenosis, lumbar region with neurogenic claudication: Secondary | ICD-10-CM | POA: Diagnosis not present

## 2022-09-20 DIAGNOSIS — J4520 Mild intermittent asthma, uncomplicated: Secondary | ICD-10-CM

## 2022-09-20 DIAGNOSIS — R262 Difficulty in walking, not elsewhere classified: Secondary | ICD-10-CM | POA: Diagnosis not present

## 2022-09-20 DIAGNOSIS — N390 Urinary tract infection, site not specified: Secondary | ICD-10-CM | POA: Diagnosis not present

## 2022-09-20 DIAGNOSIS — R531 Weakness: Secondary | ICD-10-CM

## 2022-09-20 DIAGNOSIS — F324 Major depressive disorder, single episode, in partial remission: Secondary | ICD-10-CM | POA: Diagnosis not present

## 2022-09-20 DIAGNOSIS — E7800 Pure hypercholesterolemia, unspecified: Secondary | ICD-10-CM

## 2022-09-20 DIAGNOSIS — I25118 Atherosclerotic heart disease of native coronary artery with other forms of angina pectoris: Secondary | ICD-10-CM

## 2022-09-20 DIAGNOSIS — K623 Rectal prolapse: Secondary | ICD-10-CM

## 2022-09-20 DIAGNOSIS — I4819 Other persistent atrial fibrillation: Secondary | ICD-10-CM | POA: Diagnosis not present

## 2022-09-20 DIAGNOSIS — R739 Hyperglycemia, unspecified: Secondary | ICD-10-CM

## 2022-09-20 DIAGNOSIS — I5032 Chronic diastolic (congestive) heart failure: Secondary | ICD-10-CM

## 2022-09-20 DIAGNOSIS — K2100 Gastro-esophageal reflux disease with esophagitis, without bleeding: Secondary | ICD-10-CM

## 2022-09-20 DIAGNOSIS — N1831 Chronic kidney disease, stage 3a: Secondary | ICD-10-CM

## 2022-09-20 MED ORDER — TRAZODONE HCL 150 MG PO TABS: ORAL_TABLET | ORAL | 2 refills | Status: DC

## 2022-09-20 MED ORDER — PANTOPRAZOLE SODIUM 40 MG PO TBEC: 40.0000 mg | DELAYED_RELEASE_TABLET | Freq: Two times a day (BID) | ORAL | 2 refills | Status: DC

## 2022-09-20 MED ORDER — VERAPAMIL HCL ER 180 MG PO TBCR: EXTENDED_RELEASE_TABLET | ORAL | 1 refills | Status: DC

## 2022-09-20 MED ORDER — SPIRONOLACTONE 25 MG PO TABS: 25.0000 mg | ORAL_TABLET | Freq: Every morning | ORAL | 0 refills | Status: DC

## 2022-09-20 MED ORDER — AMITRIPTYLINE HCL 25 MG PO TABS: 25.0000 mg | ORAL_TABLET | Freq: Every day | ORAL | 3 refills | Status: DC

## 2022-09-20 NOTE — Progress Notes (Unsigned)
Patient ID: Charlotte Sessions, MD, female   DOB: 1936/06/27, 86 y.o.   MRN: [id number]   Virtual Visit via telephone Note   All issues noted in this document were discussed and addressed.  No physical exam was performed (except for noted visual exam findings with Video Visits).   I connected with [name] by telephone and verified that I am speaking with the correct person using two identifiers. Location patient: home Location provider: work  Persons participating in the telephone visit: patient, provider  The limitations, risks, security and privacy concerns of performing an evaluation and management service by telephone and the availability of in person appointments have been discussed.  It has also been discussed with the patient that there may be a patient responsible charge related to this service. The patient expressed understanding and agreed to proceed.   Reason for visit: follow up appt  HPI: Follow up regarding her blood pressure, afib, CAD, CKD and rectal prolapse.  Has a known rectal prolapse.  Was seen in ER 09/10/22 - rectal prolapse - due to difficulty reducing the prolapse on her own.  Has been evaluated previously and per pt - not a surgical candidate.  She is not able to walk and get around like she was previously.  Breathing overall stable.  No chest pain reported.  Drinking boost.  No cough or congestion.  No abdominal pain.  Not taking miralax.  Has noticed frequent urination.  Urine obtained.  Discussed treatment.  She is ok with waiting on culture.  Does not feel similar to previous infections.     ROS: See pertinent positives and negatives per HPI.  Past Surgical History:  Procedure Laterality Date   ADENOIDECTOMY     age 13   BACK SURGERY     L3-L5   BILATERAL CARPAL TUNNEL RELEASE     BREAST BIOPSY Right    bx x 3 neg   BREAST SURGERY Right    biopsy x 3 (all benign)   CARDIAC CATHETERIZATION N/A 06/01/2016   Procedure: LEFT HEART CATH AND CORS/GRAFTS  ANGIOGRAPHY;  Surgeon: Wellington Hampshire, MD;  Location: [place] [facility];  Service: Cardiovascular;  Laterality: N/A;   CARDIAC CATHETERIZATION N/A 06/01/2016   Procedure: Coronary Stent Intervention;  Surgeon: Wellington Hampshire, MD;  Location: [place] [facility];  Service: Cardiovascular;  Laterality: N/A;   CARDIOVERSION N/A 03/08/2017   Procedure: CARDIOVERSION;  Surgeon: Wellington Hampshire, MD;  Location: [facility];  Service: Cardiovascular;  Laterality: N/A;   CATARACT EXTRACTION W/PHACO Right 01/04/2016   Procedure: CATARACT EXTRACTION PHACO AND INTRAOCULAR LENS PLACEMENT ([place]);  Surgeon: Leandrew Koyanagi, MD;  Location: [place];  Service: Ophthalmology;  Laterality: Right;  MALYUGIN   CATARACT EXTRACTION W/PHACO Left 01/25/2016   Procedure: CATARACT EXTRACTION PHACO AND INTRAOCULAR LENS PLACEMENT ([place]) left eye;  Surgeon: Leandrew Koyanagi, MD;  Location: [place];  Service: Ophthalmology;  Laterality: Left;  MALYUGIN SHUGARCAINE   CHOLECYSTECTOMY  90's   COLONOSCOPY  03/19/2020   Procedure: COLONOSCOPY;  Surgeon: Virgel Manifold, MD;  Location: [facility];  Service: Gastroenterology;;   COLONOSCOPY WITH PROPOFOL N/A 05/03/2015   Procedure: COLONOSCOPY WITH PROPOFOL;  Surgeon: Lollie Sails, MD;  Location: [facility];  Service: Endoscopy;  Laterality: N/A;   COLONOSCOPY WITH PROPOFOL N/A 03/19/2020   Procedure: COLONOSCOPY WITH PROPOFOL;  Surgeon: Virgel Manifold, MD;  Location: [facility];  Service: Endoscopy;  Laterality: N/A;   CORONARY ARTERY BYPASS GRAFT  98  4 vessel   CORONARY ARTERY BYPASS GRAFT     x 4 age 72 per pt    CORONARY STENT INTERVENTION N/A 03/31/2018   Procedure: CORONARY STENT INTERVENTION;  Surgeon: Wellington Hampshire, MD;  Location: [place] [facility];  Service: Cardiovascular;  Laterality: N/A;   ESOPHAGOGASTRODUODENOSCOPY N/A 03/22/2015   Procedure: ESOPHAGOGASTRODUODENOSCOPY (EGD);  Surgeon: Lollie Sails,  MD;  Location[facility];  Service: Endoscopy;  Laterality: N/A;   ESOPHAGOGASTRODUODENOSCOPY N/A 03/19/2020   Procedure: ESOPHAGOGASTRODUODENOSCOPY (EGD);  Surgeon: Virgel Manifold, MD;  Location: [facility] ENDOSCOPY;  Service: Endoscopy;  Laterality: N/A;   HEMORRHOID BANDING     HEMORRHOID SURGERY N/A 02/17/2018   Procedure: HEMORRHOIDECTOMY;  Surgeon: Robert Bellow, MD;  Location: [facility];  Service: General;  Laterality: N/A;   JOINT REPLACEMENT     BILATERAL KNEE REPLACEMENTS   KNEE ARTHROSCOPY W/ OATS PROCEDURE     Lt knee (9/01), Rt knee (3/11), Lt hip (5/10)   LEFT HEART CATH AND CORONARY ANGIOGRAPHY N/A 03/31/2018   Procedure: LEFT HEART CATH AND CORONARY ANGIOGRAPHY;  Surgeon: Wellington Hampshire, MD;  Location: [place] [facility];  Service: Cardiovascular;  Laterality: N/A;   TOTAL HIP ARTHROPLASTY      Family History  Problem Relation Age of Onset   Thyroid disease Mother        graves disease   Heart disease Father        rheumatic heart   Alcohol abuse Father    Depression Father    Lung cancer Sister    Depression Daughter    Thyroid disease Daughter        hashimoto   Depression Son    Stroke Maternal Grandmother    Depression Maternal Grandmother    Hypertension Maternal Grandfather    Hypertension Paternal Grandfather    Seizures Son    Breast cancer Neg Hx     SOCIAL HX: reviewed.     EXAM:  GENERAL: alert. Sounds to be in no acute distress.  Answering questions appropriately.   PSYCH/NEURO: pleasant and cooperative, no obvious depression or anxiety, speech and thought processing grossly intact  ASSESSMENT AND PLAN:  Discussed the following assessment and plan:  Problem List Items Addressed This Visit     A-fib ([place]) - Primary    Followed by cardiology.  On eliquis.   Stable.  No changes made.        Relevant Medications   spironolactone (ALDACTONE) 25 MG tablet   verapamil (CALAN-SR) 180 MG CR tablet   Ambulatory dysfunction     Not able to walk around as she was previously doing.  Has had PT.  Notify if desires any further intervention. Follow.       Asthma    Breathing stable.       Chronic diastolic heart failure (HCC)    Breathing stable.  On altace, lasix and aldactone.  Appears to be stable.  Follow.        Relevant Medications   spironolactone (ALDACTONE) 25 MG tablet   verapamil (CALAN-SR) 180 MG CR tablet   Coronary atherosclerosis    Followed by cardiology.  On eliquis.  Stable.        Relevant Medications   spironolactone (ALDACTONE) 25 MG tablet   verapamil (CALAN-SR) 180 MG CR tablet   Depression    Doing well on current regimen.  Continue trazodone.  Denies any adverse effects. Follow.        Relevant Medications   amitriptyline (ELAVIL)  25 MG tablet   traZODone (DESYREL) 150 MG tablet   Frequent UTI    Saw urology.  Renal ultrasound ok.  Taking cranberry pills.  Using estrogen cream.  Urinary frequency as outlined. .  Will check urine to confirm no infection.        GERD (gastroesophageal reflux disease)    No upper symptoms.  On prilosec.       Relevant Medications   pantoprazole (PROTONIX) 40 MG tablet   Hypercholesterolemia    On repatha.  Follow lipid panel.       Relevant Medications   spironolactone (ALDACTONE) 25 MG tablet   verapamil (CALAN-SR) 180 MG CR tablet   Hyperglycemia    Low carb diet and exercise.  Follow met b and a1c.       Hyperlipidemia    Intolerant to statin medication.  On zetia and repatha.  Follow lipid panel.  Low cholesterol diet and exercise.        Relevant Medications   spironolactone (ALDACTONE) 25 MG tablet   verapamil (CALAN-SR) 180 MG CR tablet   Rectal prolapse    Has seen Dr Leighton Ruff [facility]).  Discussed.  Not felt to be a surgical candidate. Follow.       Spinal stenosis    Chronic back and leg pain.  Requires tramadol and tylenol to function.  Limits activity.  Not walking. Follow.       Stage 3a chronic  kidney disease (HCC)    Avoid antiinflammatories.  Stay hydrated.  Follow metabolic panel.       Weakness    Leg weakness.  Has been to PT.  Notify - if desires further intervention. Follow.        Return if symptoms worsen or fail to improve.   I discussed the assessment and treatment plan with the patient. The patient was provided an opportunity to ask questions and all were answered. The patient agreed with the plan and demonstrated an understanding of the instructions.   The patient was advised to call back or seek an in-person evaluation if the symptoms worsen or if the condition fails to improve as anticipated.  I provided 23 minutes of non-face-to-face time during this encounter.   [name], MD

## 2022-09-20 NOTE — Telephone Encounter (Signed)
Spoke to Wagner - advised to please call the office when results come in for continuity of care

## 2022-09-20 NOTE — Telephone Encounter (Signed)
Noted.  Hold for results.  Apparently has appt 09/20/22 .

## 2022-09-24 ENCOUNTER — Telehealth: Payer: Self-pay | Admitting: Internal Medicine

## 2022-09-24 NOTE — Telephone Encounter (Signed)
Charlotte Wagner called stating he wanted to speak to Dr. Nicki Reaper regarding her appointment with the patient last week. He stated that the patient could not remember the visit with Dr. Nicki Reaper. He stated he he thinks his mother may have to go into hospice care. I asked if he had a POA over his mother. He stated he did not. However, his brother Angelyna Henderson does and he would contact him to let him know that he would probably receive a call from the office regarding their mother.

## 2022-09-27 NOTE — Telephone Encounter (Signed)
LM for pt son Lennox Grumbles

## 2022-10-03 ENCOUNTER — Encounter: Payer: Self-pay | Admitting: Internal Medicine

## 2022-10-03 NOTE — Assessment & Plan Note (Signed)
Not able to walk around as she was previously doing.  Has had PT.  Notify if desires any further intervention. Follow.

## 2022-10-03 NOTE — Assessment & Plan Note (Signed)
Breathing stable.

## 2022-10-03 NOTE — Assessment & Plan Note (Signed)
Chronic back and leg pain.  Requires tramadol and tylenol to function.  Limits activity.  Not walking. Follow.

## 2022-10-03 NOTE — Assessment & Plan Note (Signed)
Followed by cardiology.  On eliquis.  Stable.

## 2022-10-03 NOTE — Assessment & Plan Note (Signed)
Breathing stable.  On altace, lasix and aldactone.  Appears to be stable.  Follow.

## 2022-10-03 NOTE — Assessment & Plan Note (Signed)
No upper symptoms.  On prilosec.

## 2022-10-03 NOTE — Assessment & Plan Note (Signed)
Saw urology.  Renal ultrasound ok.  Taking cranberry pills.  Using estrogen cream.  Urinary frequency as outlined. .  Will check urine to confirm no infection.

## 2022-10-03 NOTE — Assessment & Plan Note (Signed)
Doing well on current regimen.  Continue trazodone.  Denies any adverse effects. Follow.

## 2022-10-03 NOTE — Assessment & Plan Note (Signed)
Has seen Dr Leighton Ruff [facility]).  Discussed.  Not felt to be a surgical candidate. Follow.

## 2022-10-03 NOTE — Assessment & Plan Note (Signed)
Low carb diet and exercise.  Follow met b and a1c.  

## 2022-10-03 NOTE — Assessment & Plan Note (Signed)
Leg weakness.  Has been to PT.  Notify - if desires further intervention. Follow.

## 2022-10-03 NOTE — Assessment & Plan Note (Signed)
Avoid antiinflammatories.  Stay hydrated.  Follow metabolic panel.   

## 2022-10-03 NOTE — Assessment & Plan Note (Signed)
Followed by cardiology.  On eliquis.   Stable.  No changes made.

## 2022-10-03 NOTE — Assessment & Plan Note (Signed)
On repatha.  Follow lipid panel.

## 2022-10-04 ENCOUNTER — Encounter: Payer: Self-pay | Admitting: Internal Medicine

## 2022-10-04 ENCOUNTER — Encounter: Payer: Self-pay | Admitting: Nurse Practitioner

## 2022-10-04 ENCOUNTER — Non-Acute Institutional Stay (SKILLED_NURSING_FACILITY): Payer: Medicare Other | Admitting: Nurse Practitioner

## 2022-10-04 DIAGNOSIS — R296 Repeated falls: Secondary | ICD-10-CM

## 2022-10-04 DIAGNOSIS — I25118 Atherosclerotic heart disease of native coronary artery with other forms of angina pectoris: Secondary | ICD-10-CM | POA: Diagnosis not present

## 2022-10-04 DIAGNOSIS — I739 Peripheral vascular disease, unspecified: Secondary | ICD-10-CM

## 2022-10-04 DIAGNOSIS — K623 Rectal prolapse: Secondary | ICD-10-CM | POA: Diagnosis not present

## 2022-10-04 DIAGNOSIS — I5032 Chronic diastolic (congestive) heart failure: Secondary | ICD-10-CM | POA: Diagnosis not present

## 2022-10-04 DIAGNOSIS — I4819 Other persistent atrial fibrillation: Secondary | ICD-10-CM

## 2022-10-04 DIAGNOSIS — F324 Major depressive disorder, single episode, in partial remission: Secondary | ICD-10-CM | POA: Diagnosis not present

## 2022-10-04 DIAGNOSIS — R634 Abnormal weight loss: Secondary | ICD-10-CM

## 2022-10-04 DIAGNOSIS — N1831 Chronic kidney disease, stage 3a: Secondary | ICD-10-CM | POA: Diagnosis not present

## 2022-10-04 NOTE — Progress Notes (Signed)
Location:  Other Nursing Home Room Number: [place] of Service:  SNF (31)  Dewayne Shorter, MD  Patient Care Team: Dewayne Shorter, MD as PCP - General (Family Medicine) Wende Bushy, MD as Consulting Physician (Cardiology) Clent Jacks, RN as Registered Nurse Rockey Situ Kathlene November, MD as Consulting Physician (Cardiology)  Extended Emergency Contact Information Primary Emergency Contact: [place] Phone: [phone] Relation: Daughter Secondary Emergency Contact: Doroteo [place] Phone: [phone] Relation: Son  Goals of care: Advanced Directive information    10/04/2022   11:47 AM  Advanced Directives  Does Patient Have a Medical Advance Directive? Yes  Type of Advance Directive Out of facility DNR (pink MOST or yellow form)  Does patient want to make changes to medical advance directive? No - Patient declined  Pre-existing out of facility DNR order (yellow form or pink MOST form) Yellow form placed in chart (order not valid for inpatient use)     Chief Complaint  Patient presents with   Medical Management of Chronic Issues    Routine follow up visit.   Immunizations    Tdap and shingrix vaccine due   Quality Metric Gaps    Mammogram and dexa scan due    HPI:  Pt is a 86 y.o. female seen today for routine follow up.  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 Memory is not the same as it used to be. Reports she is having memory loss- recent head CT done in September without acute findings. Age related volume loss.    She has hx of spinal stenosis and unable to walk due to this. Reports pain is controled, will use tramadol occasionally if needed  Rectal Prolase- ongoing issues, not a surgical candidate- is out if she is standing and this is very uncomfortable- feels like a sore.   She was constipated but had 2 good BMs today  She has several pressure ulcers on her  sacrum that nursing is using silvadeen and area is healing.   Denies depression but reports she is sadden by her situation. She has been on amitriptyline  She reports depression is strong in her family and feels like her mood is very controlled on the amitriptyline. She is very content with life here at [facility].  She had the chaplin come talk to her here and very content.    Past Medical History:  Diagnosis Date   Acute GI bleeding 03/16/2020   Acute on chronic diastolic CHF (congestive heart failure) ([place]) 10/08/2016   Allergy    Arthritis    s/p bilateral knees and left hip replacement   Asthma    Benign essential hypertension 06/21/2013   Formatting of this note might be different from the original. Last Assessment & Plan:  Blood pressure doing well.  Follow.  Check metabolic panel.   CAD (coronary artery disease)    a. 12/1996 s/p CABG x4 ([place]);  b. 2005 Pt reports stress test & cath, which revealed patent grafts.   Cancer [facility])    melanoma right arm   Carotid arterial disease ([place])    a. 04/2015 Carotid U/S: <50% bilat ICA stenosis.   Chronic kidney disease    Colon polyps    H/O   Depression    GERD (gastroesophageal reflux disease)    h/o hiatal hernia   Headache    migraines in past   Heart murmur    a. 04/2011 Echo:  EF 55-60%, bilat atrial enlargement, mild to mod TR.   History of chicken pox    History of hiatal hernia    Hx of migraines    rare now   Hx: UTI (urinary tract infection)    Hyperlipidemia    Hypertension    Hypertensive heart disease    Lichen planus    Melanoma (HCC)    Palpitations    a. rare PVC's and h/o SVT.   PMR (polymyalgia rheumatica) (HCC)    h/o in setting of crestor usage.   PSVT (paroxysmal supraventricular tachycardia)    PUD (peptic ulcer disease)    remote history   Raynaud's phenomenon    Spinal stenosis    Urine incontinence    H/O   Vitamin D deficiency    Past Surgical History:  Procedure Laterality Date    ADENOIDECTOMY     age 64   BACK SURGERY     L3-L5   BILATERAL CARPAL TUNNEL RELEASE     BREAST BIOPSY Right    bx x 3 neg   BREAST SURGERY Right    biopsy x 3 (all benign)   CARDIAC CATHETERIZATION N/A 06/01/2016   Procedure: LEFT HEART CATH AND CORS/GRAFTS ANGIOGRAPHY;  Surgeon: Wellington Hampshire, MD;  Location: [place] CV LAB;  Service: Cardiovascular;  Laterality: N/A;   CARDIAC CATHETERIZATION N/A 06/01/2016   Procedure: Coronary Stent Intervention;  Surgeon: Wellington Hampshire, MD;  Location: [place] [facility] LAB;  Service: Cardiovascular;  Laterality: N/A;   CARDIOVERSION N/A 03/08/2017   Procedure: CARDIOVERSION;  Surgeon: Wellington Hampshire, MD;  Location: [facility] ORS;  Service: Cardiovascular;  Laterality: N/A;   CATARACT EXTRACTION W/PHACO Right 01/04/2016   Procedure: CATARACT EXTRACTION PHACO AND INTRAOCULAR LENS PLACEMENT ([place]);  Surgeon: Leandrew Koyanagi, MD;  Location: [place];  Service: Ophthalmology;  Laterality: Right;  MALYUGIN   CATARACT EXTRACTION W/PHACO Left 01/25/2016   Procedure: CATARACT EXTRACTION PHACO AND INTRAOCULAR LENS PLACEMENT ([place]) left eye;  Surgeon: Leandrew Koyanagi, MD;  Location: [place];  Service: Ophthalmology;  Laterality: Left;  MALYUGIN SHUGARCAINE   CHOLECYSTECTOMY  90's   COLONOSCOPY  03/19/2020   Procedure: COLONOSCOPY;  Surgeon: Virgel Manifold, MD;  Location: [facility] ENDOSCOPY;  Service: Gastroenterology;;   COLONOSCOPY WITH PROPOFOL N/A 05/03/2015   Procedure: COLONOSCOPY WITH PROPOFOL;  Surgeon: Lollie Sails, MD;  Location: [facility] ENDOSCOPY;  Service: Endoscopy;  Laterality: N/A;   COLONOSCOPY WITH PROPOFOL N/A 03/19/2020   Procedure: COLONOSCOPY WITH PROPOFOL;  Surgeon: Virgel Manifold, MD;  Location: [facility] ENDOSCOPY;  Service: Endoscopy;  Laterality: N/A;   CORONARY ARTERY BYPASS GRAFT  98   4 vessel   CORONARY ARTERY BYPASS GRAFT     x 4 age 60 per pt    CORONARY STENT INTERVENTION N/A 03/31/2018    Procedure: CORONARY STENT INTERVENTION;  Surgeon: Wellington Hampshire, MD;  Location: [place] CV LAB;  Service: Cardiovascular;  Laterality: N/A;   ESOPHAGOGASTRODUODENOSCOPY N/A 03/22/2015   Procedure: ESOPHAGOGASTRODUODENOSCOPY (EGD);  Surgeon: Lollie Sails, MD;  Location: [facility] ENDOSCOPY;  Service: Endoscopy;  Laterality: N/A;   ESOPHAGOGASTRODUODENOSCOPY N/A 03/19/2020   Procedure: ESOPHAGOGASTRODUODENOSCOPY (EGD);  Surgeon: Virgel Manifold, MD;  Location: [facility] ENDOSCOPY;  Service: Endoscopy;  Laterality: N/A;   HEMORRHOID BANDING     HEMORRHOID SURGERY N/A 02/17/2018   Procedure: HEMORRHOIDECTOMY;  Surgeon: Robert Bellow, MD;  Location: [facility] ORS;  Service: General;  Laterality: N/A;   JOINT REPLACEMENT     BILATERAL KNEE REPLACEMENTS  KNEE ARTHROSCOPY W/ OATS PROCEDURE     Lt knee (9/01), Rt knee (3/11), Lt hip (5/10)   LEFT HEART CATH AND CORONARY ANGIOGRAPHY N/A 03/31/2018   Procedure: LEFT HEART CATH AND CORONARY ANGIOGRAPHY;  Surgeon: Wellington Hampshire, MD;  Location: [place] CV LAB;  Service: Cardiovascular;  Laterality: N/A;   TOTAL HIP ARTHROPLASTY      Allergies  Allergen Reactions   Beta Adrenergic Blockers Shortness Of Breath    DEPRESSION/HYPOTENSION   Brilinta [Ticagrelor] Shortness Of Breath    Caused AFIB   Albuterol     rigors   Antihistamines, Diphenhydramine-Type     Muscle spasms   Aspirin Other (See Comments)    Heartburn    Nsaids Other (See Comments)    ULCER HISTORY & CURRENTLY ON BLOOD THINNERS   Penicillins Hives and Other (See Comments)    Has patient had a PCN reaction causing immediate rash, facial/tongue/throat swelling, SOB or lightheadedness with hypotension: No Has patient had a PCN reaction causing severe rash involving mucus membranes or skin necrosis: No Has patient had a PCN reaction that required hospitalization No Has patient had a PCN reaction occurring within the last 10 years: No If all of the above answers are  "NO", then may proceed with Cephalosporin use.    Statins Other (See Comments)    Muscle weakness   Sulfasalazine Rash    At mouth   Tetracycline Rash   Tetracyclines & Related Rash    Outpatient Encounter Medications as of 10/04/2022  Medication Sig   acetaminophen (TYLENOL) 325 MG tablet Take 650 mg by mouth every 4 (four) hours as needed for mild pain or moderate pain.   acetaminophen (TYLENOL) 500 MG tablet Take 1,000 mg by mouth 2 (two) times daily.   aluminum-magnesium hydroxide-simethicone (MAALOX) 625-638-93 MG/5ML SUSP 30 mLs.  Give 2 Tbsp by mouth every 4 hours as needed for gas, indigestion, or upset stomach supervised self-administration Notify MD if no relief   amitriptyline (ELAVIL) 25 MG tablet Take 1 tablet (25 mg total) by mouth at bedtime.   apixaban (ELIQUIS) 2.5 MG TABS tablet Take 1 tablet (2.5 mg total) by mouth 2 (two) times daily.   ATROVENT HFA 17 MCG/ACT inhaler INHALE 2 PUFFS INTO THE LUNGS EVERY 6 HOURS AS NEEDED FOR WHEEZING   b complex vitamins tablet Take 1 tablet by mouth daily.   bismuth subsalicylate (QC PINK BISMUTH) 262 MG/15ML suspension Take 10 mLs by mouth as directed. Every 24 hours as needed for loose stools   Cholecalciferol (VITAMIN D3) 2000 UNITS TABS Take 1,000 Units by mouth 2 (two) times a week. Tuesday and Saturdays.   Coenzyme Q10 (COQ-10) 200 MG CAPS Take 200 mg by mouth daily.    conjugated estrogens (PREMARIN) vaginal cream Apply 0.'5mg'$  (pea-sized amount)  just inside the vaginal introitus with a finger-tip on  Monday, Wednesday and Friday nights.   CRANBERRY PO Take 1 capsule by mouth 4 (four) times daily.   dextromethorphan-guaiFENesin (ROBITUSSIN-DM) 10-100 MG/5ML liquid Take 10 mLs by mouth every 4 (four) hours as needed for cough.   Evolocumab (REPATHA SURECLICK) 734 MG/ML SOAJ INJECT 1 PEN EVERY 14 DAYS AS DIRECTED MUST SCHEDULE OFFICE VISIT FOR FURTHER REFILLS   ezetimibe (ZETIA) 10 MG tablet TAKE 1 TABLET BY MOUTH DAILY    furosemide (LASIX) 40 MG tablet Take 40 mg by mouth daily.   hydrocortisone 2.5 % cream APPLY TO LICHEN PLANUS AS DIRECTED   hydrocortisone cream (PREPARATION H) 1 % Apply 1 Application  topically every 6 (six) hours as needed for itching. Apply to rectal area   ketoconazole (NIZORAL) 2 % cream Apply 1 Application topically daily as needed for irritation (for bleeding fingers).   lisinopril (ZESTRIL) 10 MG tablet Take 10 mg by mouth daily.   loratadine (CLARITIN) 10 MG tablet Take 10 mg by mouth daily.   miconazole (MICOTIN) 2 % powder Apply 1 Application topically as directed. Apply to vagina every 24 hours as need for vaginal itching   miconazole (MONISTAT 7) 2 % vaginal cream Place 1 Applicatorful vaginally as directed. Every 24 hours as needed for lichen planus   Multiple Vitamins-Minerals (HAIR/SKIN/NAILS PO) Take 1 tablet by mouth daily.   nystatin powder Apply 1 Application topically every 8 (eight) hours as needed.   ondansetron (ZOFRAN) 4 MG tablet Take 4 mg by mouth every 8 (eight) hours as needed for nausea or vomiting.   pantoprazole (PROTONIX) 40 MG tablet Take 1 tablet (40 mg total) by mouth 2 (two) times daily.   polyethylene glycol (MIRALAX / GLYCOLAX) 17 g packet Take 17 g by mouth daily. In the evening for bowel regimen   Protein (PROSOURCE PO) Take 1 Dose by mouth 2 (two) times daily. For wound healing   senna (SENOKOT) 8.6 MG tablet Take 2 tablets by mouth daily.   spironolactone (ALDACTONE) 25 MG tablet Take 1 tablet (25 mg total) by mouth every morning.   traMADol (ULTRAM) 50 MG tablet TAKE ONE TABLET TWICE DAILY AS NEEDED   traZODone (DESYREL) 150 MG tablet Two tablets by mouth at bedtime as needed for sleep   triamcinolone cream (KENALOG) 0.1 % Apply 1 Application topically every 12 (twelve) hours as needed (for rash).   verapamil (CALAN-SR) 180 MG CR tablet TAKE ONE (1) TABLET BY MOUTH TWO TIMES PER DAY   vitamin B-12 (CYANOCOBALAMIN) 1000 MCG tablet Take 1,000 mcg by  mouth daily.   vitamin C (ASCORBIC ACID) 500 MG tablet Take 500 mg by mouth at bedtime.   silver sulfADIAZINE (SILVADENE) 1 % cream Apply 1 Application topically 2 (two) times daily. For wound care   No facility-administered encounter medications on file as of 10/04/2022.    Review of Systems  Constitutional:  Negative for activity change, appetite change, fatigue and unexpected weight change.  HENT:  Negative for congestion and hearing loss.   Eyes: Negative.   Respiratory:  Negative for cough and shortness of breath.   Cardiovascular:  Negative for chest pain, palpitations and leg swelling.  Gastrointestinal:  Positive for constipation and rectal pain. Negative for abdominal pain and diarrhea.  Genitourinary:  Negative for difficulty urinating and dysuria.  Musculoskeletal:  Positive for arthralgias and back pain. Negative for myalgias.  Skin:  Positive for wound. Negative for color change.  Neurological:  Negative for dizziness and weakness.  Psychiatric/Behavioral:  Positive for confusion. Negative for agitation and behavioral problems.     Immunization History  Administered Date(s) Administered   Fluad Quad(high Dose 65+) 06/22/2021   Influenza, High Dose Seasonal PF 06/14/2016, 07/08/2018, 07/16/2019, 07/24/2022   Influenza,inj,Quad PF,6+ Mos 06/15/2014   Influenza-Unspecified 07/13/2015   Moderna Covid-19 Vaccine Bivalent Booster 2yr & up 06/30/2021   Moderna Sars-Covid-2 Vaccination 10/22/2019, 11/19/2019, 08/19/2020, 02/24/2021   Pneumococcal Conjugate-13 01/13/2015   Pneumococcal Polysaccharide-23 06/21/2009   Tdap 02/22/2010   Unspecified SARS-COV-2 Vaccination 08/17/2022   Zoster, Live 11/15/2010   Pertinent  Health Maintenance Due  Topic Date Due   DEXA SCAN  Never done   MAMMOGRAM  12/18/2019  COLONOSCOPY (Pts 45-2yr Insurance coverage will need to be confirmed)  03/19/2025   INFLUENZA VACCINE  Completed      06/21/2022    8:00 PM 06/22/2022   10:00 AM  06/27/2022   11:15 AM 09/10/2022   12:22 AM 09/20/2022    3:00 PM  [place]in the past year?     1  Was there an injury with Fall?     0  Fall Risk Category Calculator     1  Fall Risk Category     Low  Patient Fall Risk Level High fall risk High fall risk High fall risk Moderate fall risk Moderate fall risk  Patient at Risk for Falls Due to   History of fall(s)  History of fall(s);Impaired balance/gait;Impaired mobility  Fall risk Follow up   Falls evaluation completed  Falls evaluation completed   Functional Status Survey:    Vitals:   10/04/22 1146  BP: (!) 101/58  Pulse: 89  Resp: 16  Temp: 97.8 F (36.6 C)  SpO2: 98%  Weight: 171 lb 12.8 oz (77.9 kg)  Height: '5\' 3"'$  (1.6 m)   Body mass index is 30.43 kg/m. Physical Exam Constitutional:      General: She is not in acute distress.    Appearance: She is well-developed. She is not diaphoretic.  HENT:     Head: Normocephalic and atraumatic.     Mouth/Throat:     Pharynx: No oropharyngeal exudate.  Eyes:     Conjunctiva/sclera: Conjunctivae normal.     Pupils: Pupils are equal, round, and reactive to light.  Cardiovascular:     Rate and Rhythm: Normal rate. Rhythm irregular.     Heart sounds: Normal heart sounds.  Pulmonary:     Effort: Pulmonary effort is normal.     Breath sounds: Normal breath sounds.  Abdominal:     General: Bowel sounds are normal.     Palpations: Abdomen is soft.  Musculoskeletal:     Cervical back: Normal range of motion and neck supple.     Right lower leg: Edema present.     Left lower leg: Edema present.  Skin:    General: Skin is warm and dry.     Comments: Dressings noted to left lower leg.   Neurological:     Mental Status: She is alert and oriented to person, place, and time.     Motor: Weakness present.     Gait: Gait abnormal.  Psychiatric:        Mood and Affect: Mood normal.        Thought Content: Thought content normal.        Judgment: Judgment normal.      Labs reviewed: Recent Labs    06/20/22 1831 06/22/22 0534 09/10/22 0025  NA 137 136 136  K 4.3 4.1 4.4  CL 101 104 103  CO2 '25 23 26  '$ GLUCOSE 108* 146* 184*  BUN 26* 21 66*  CREATININE 1.21* 0.88 1.13*  CALCIUM 9.7 9.8 10.1  MG  --  2.3  --    Recent Labs    05/10/22 1532 06/20/22 2136 09/10/22 0025  AST '21 27 21  '$ ALT '18 21 23  '$ ALKPHOS 50 54 50  BILITOT 0.5 1.0 0.8  PROT 6.6 6.9 7.2  ALBUMIN 4.4 4.2 4.0   Recent Labs    05/10/22 1532 06/20/22 1831 06/21/22 0835 09/10/22 0025  WBC 5.5 6.5 6.4 6.1  NEUTROABS 3.9  --   --  4.6  HGB 13.0 12.2 12.9 13.5  HCT 38.7 37.8 38.8 41.7  MCV 103.3* 104.1* 101.0* 106.1*  PLT 242.0 224 233 217   Lab Results  Component Value Date   TSH 1.58 01/11/2022   Lab Results  Component Value Date   HGBA1C 6.1 05/10/2022   Lab Results  Component Value Date   CHOL 121 05/10/2022   HDL 56.80 05/10/2022   LDLCALC 41 05/10/2022   LDLDIRECT 126.7 07/03/2013   TRIG 120.0 05/10/2022   CHOLHDL 2 05/10/2022    Significant Diagnostic Results in last 30 days:  No results found.  Assessment/Plan 1. Chronic diastolic heart failure (HCC) Euvolemic at this time, continues on lasix  2. Stage 3a chronic kidney disease (HCC) -Chronic and stable Encourage proper hydration Follow metabolic panel Avoid nephrotoxic meds (NSAIDS)  3. Rectal prolapse -continues to cause increase in pain and discomfort, limiting mobility. Continues on tramadol.  She is not a surgical candidate   4. Major depressive disorder in partial remission, unspecified whether recurrent (HCC) Stable, continues on amitriptyline and does not wish to reduce medication or change.   5. Weight loss -noted, continues on supplements, discussed adding appetite stimulant and she was not interested.   6. Persistent atrial fibrillation (HCC) Rate controlled on verapamil, continues on eliquis for anticoagulation.   7. Recurrent falls Wheelchair bound, fall  precautions in place  8. Peripheral arterial disease ([place]) Noted, close monitoring by nursing, continues on eliquis for anticoagulation.   9. Atherosclerosis of native coronary artery of native heart with stable angina pectoris ([place]) -without chest pains at this time, continues on eliquis, zetia (intolerant to statin)     Carlos American. [place], [facility] [phone]

## 2022-10-04 NOTE — Assessment & Plan Note (Signed)
Intolerant to statin medication.  On zetia and repatha.  Follow lipid panel.  Low cholesterol diet and exercise.

## 2022-10-10 DIAGNOSIS — B351 Tinea unguium: Secondary | ICD-10-CM | POA: Diagnosis not present

## 2022-10-10 DIAGNOSIS — I7091 Generalized atherosclerosis: Secondary | ICD-10-CM | POA: Diagnosis not present

## 2022-11-26 ENCOUNTER — Encounter: Payer: Self-pay | Admitting: Student

## 2022-11-26 ENCOUNTER — Non-Acute Institutional Stay (SKILLED_NURSING_FACILITY): Payer: Medicare Other | Admitting: Student

## 2022-11-26 DIAGNOSIS — F324 Major depressive disorder, single episode, in partial remission: Secondary | ICD-10-CM | POA: Diagnosis not present

## 2022-11-26 DIAGNOSIS — N1831 Chronic kidney disease, stage 3a: Secondary | ICD-10-CM | POA: Diagnosis not present

## 2022-11-26 DIAGNOSIS — I5032 Chronic diastolic (congestive) heart failure: Secondary | ICD-10-CM | POA: Diagnosis not present

## 2022-11-26 DIAGNOSIS — L98429 Non-pressure chronic ulcer of back with unspecified severity: Secondary | ICD-10-CM | POA: Diagnosis not present

## 2022-11-26 DIAGNOSIS — I25118 Atherosclerotic heart disease of native coronary artery with other forms of angina pectoris: Secondary | ICD-10-CM

## 2022-11-26 DIAGNOSIS — I4819 Other persistent atrial fibrillation: Secondary | ICD-10-CM

## 2022-11-26 DIAGNOSIS — I739 Peripheral vascular disease, unspecified: Secondary | ICD-10-CM | POA: Diagnosis not present

## 2022-11-26 NOTE — Progress Notes (Unsigned)
Location:  Other [place].  Nursing Home Room Number: [place] [facility] Service:  SNF [phone]) Provider:  Dewayne Shorter, MD  Patient Care Team: Dewayne Shorter, MD as PCP - General (Family Medicine) Wende Bushy, MD as Consulting Physician (Cardiology) Clent Jacks, RN as Registered Nurse Rockey Situ Kathlene November, MD as Consulting Physician (Cardiology)  Extended Emergency Contact Information Primary Emergency Contact: [place] Phone: [phone] Relation: Daughter Secondary Emergency Contact: Doroteo Glassman [place] of [place] Phone: [phone] Relation: Son  Code Status:  DNR Goals of care: Advanced Directive information    11/26/2022   11:08 AM  Advanced Directives  Does Patient Have a Medical Advance Directive? Yes  Type of Advance Directive Out of facility DNR (pink MOST or yellow form)  Does patient want to make changes to medical advance directive? No - Patient declined     Chief Complaint  Patient presents with   Medical Management of Chronic Issues    Medical Management of Chronic Issues.     HPI:  Pt is a 87 y.o. female seen today for medical management of chronic diseases.     Past Medical History:  Diagnosis Date   Acute GI bleeding 03/16/2020   Acute on chronic diastolic CHF (congestive heart failure) ([place]) 10/08/2016   Allergy    Arthritis    s/p bilateral knees and left hip replacement   Asthma    Benign essential hypertension 06/21/2013   Formatting of this note might be different from the original. Last Assessment & Plan:  Blood pressure doing well.  Follow.  Check metabolic panel.   CAD (coronary artery disease)    a. 12/1996 s/p CABG x4 ([place]);  b. 2005 Pt reports stress test & cath, which revealed patent grafts.   Cancer [facility])    melanoma right arm   Carotid arterial disease ([place])    a. 04/2015 Carotid U/S: <50% bilat ICA stenosis.   Chronic kidney disease    Colon polyps     H/O   Depression    GERD (gastroesophageal reflux disease)    h/o hiatal hernia   Headache    migraines in past   Heart murmur    a. 04/2011 Echo: EF 55-60%, bilat atrial enlargement, mild to mod TR.   History of chicken pox    History of hiatal hernia    Hx of migraines    rare now   Hx: UTI (urinary tract infection)    Hyperlipidemia    Hypertension    Hypertensive heart disease    Lichen planus    Melanoma (HCC)    Palpitations    a. rare PVC's and h/o SVT.   PMR (polymyalgia rheumatica) (HCC)    h/o in setting of crestor usage.   PSVT (paroxysmal supraventricular tachycardia)    PUD (peptic ulcer disease)    remote history   Raynaud's phenomenon    Spinal stenosis    Urine incontinence    H/O   Vitamin D deficiency    Past Surgical History:  Procedure Laterality Date   ADENOIDECTOMY     age 30   BACK SURGERY     L3-L5   BILATERAL CARPAL TUNNEL RELEASE     BREAST BIOPSY Right    bx x 3 neg   BREAST SURGERY Right    biopsy x 3 (all benign)   CARDIAC CATHETERIZATION N/A 06/01/2016   Procedure: LEFT HEART CATH AND CORS/GRAFTS ANGIOGRAPHY;  Surgeon: Wellington Hampshire, MD;  Location: [place] CV LAB;  Service: Cardiovascular;  Laterality: N/A;   CARDIAC CATHETERIZATION N/A 06/01/2016   Procedure: Coronary Stent Intervention;  Surgeon: Wellington Hampshire, MD;  Location: [place] CV LAB;  Service: Cardiovascular;  Laterality: N/A;   CARDIOVERSION N/A 03/08/2017   Procedure: CARDIOVERSION;  Surgeon: Wellington Hampshire, MD;  Location: [facility] ORS;  Service: Cardiovascular;  Laterality: N/A;   CATARACT EXTRACTION W/PHACO Right 01/04/2016   Procedure: CATARACT EXTRACTION PHACO AND INTRAOCULAR LENS PLACEMENT ([place]);  Surgeon: Leandrew Koyanagi, MD;  Location: [place];  Service: Ophthalmology;  Laterality: Right;  MALYUGIN   CATARACT EXTRACTION W/PHACO Left 01/25/2016   Procedure: CATARACT EXTRACTION PHACO AND INTRAOCULAR LENS PLACEMENT ([place]) left eye;  Surgeon:  Leandrew Koyanagi, MD;  Location: [place];  Service: Ophthalmology;  Laterality: Left;  MALYUGIN SHUGARCAINE   CHOLECYSTECTOMY  90's   COLONOSCOPY  03/19/2020   Procedure: COLONOSCOPY;  Surgeon: Virgel Manifold, MD;  Location: [facility] ENDOSCOPY;  Service: Gastroenterology;;   COLONOSCOPY WITH PROPOFOL N/A 05/03/2015   Procedure: COLONOSCOPY WITH PROPOFOL;  Surgeon: Lollie Sails, MD;  Location: [facility] ENDOSCOPY;  Service: Endoscopy;  Laterality: N/A;   COLONOSCOPY WITH PROPOFOL N/A 03/19/2020   Procedure: COLONOSCOPY WITH PROPOFOL;  Surgeon: Virgel Manifold, MD;  Location: [facility] ENDOSCOPY;  Service: Endoscopy;  Laterality: N/A;   CORONARY ARTERY BYPASS GRAFT  98   4 vessel   CORONARY ARTERY BYPASS GRAFT     x 4 age 70 per pt    CORONARY STENT INTERVENTION N/A 03/31/2018   Procedure: CORONARY STENT INTERVENTION;  Surgeon: Wellington Hampshire, MD;  Location: [place] CV LAB;  Service: Cardiovascular;  Laterality: N/A;   ESOPHAGOGASTRODUODENOSCOPY N/A 03/22/2015   Procedure: ESOPHAGOGASTRODUODENOSCOPY (EGD);  Surgeon: Lollie Sails, MD;  Location: [facility] ENDOSCOPY;  Service: Endoscopy;  Laterality: N/A;   ESOPHAGOGASTRODUODENOSCOPY N/A 03/19/2020   Procedure: ESOPHAGOGASTRODUODENOSCOPY (EGD);  Surgeon: Virgel Manifold, MD;  Location: [facility] ENDOSCOPY;  Service: Endoscopy;  Laterality: N/A;   HEMORRHOID BANDING     HEMORRHOID SURGERY N/A 02/17/2018   Procedure: HEMORRHOIDECTOMY;  Surgeon: Robert Bellow, MD;  Location: [facility] ORS;  Service: General;  Laterality: N/A;   JOINT REPLACEMENT     BILATERAL KNEE REPLACEMENTS   KNEE ARTHROSCOPY W/ OATS PROCEDURE     Lt knee (9/01), Rt knee (3/11), Lt hip (5/10)   LEFT HEART CATH AND CORONARY ANGIOGRAPHY N/A 03/31/2018   Procedure: LEFT HEART CATH AND CORONARY ANGIOGRAPHY;  Surgeon: Wellington Hampshire, MD;  Location: [place] [facility] LAB;  Service: Cardiovascular;  Laterality: N/A;   TOTAL HIP ARTHROPLASTY      Allergies   Allergen Reactions   Beta Adrenergic Blockers Shortness Of Breath    DEPRESSION/HYPOTENSION   Brilinta [Ticagrelor] Shortness Of Breath    Caused AFIB   Albuterol     rigors   Antihistamines, Diphenhydramine-Type     Muscle spasms   Aspirin Other (See Comments)    Heartburn    Nsaids Other (See Comments)    ULCER HISTORY & CURRENTLY ON BLOOD THINNERS   Penicillins Hives and Other (See Comments)    Has patient had a PCN reaction causing immediate rash, facial/tongue/throat swelling, SOB or lightheadedness with hypotension: No Has patient had a PCN reaction causing severe rash involving mucus membranes or skin necrosis: No Has patient had a PCN reaction that required hospitalization No Has patient had a PCN reaction occurring within the last 10 years: No If all of the above answers are "NO", then may  proceed with Cephalosporin use.    Statins Other (See Comments)    Muscle weakness   Sulfasalazine Rash    At mouth   Tetracycline Rash   Tetracyclines & Related Rash    Outpatient Encounter Medications as of 11/26/2022  Medication Sig   acetaminophen (TYLENOL) 325 MG tablet Take 650 mg by mouth every 4 (four) hours as needed for mild pain or moderate pain.   acetaminophen (TYLENOL) 500 MG tablet Take 1,000 mg by mouth 2 (two) times daily.   aluminum-magnesium hydroxide-simethicone (MAALOX) [id number] MG/5ML SUSP 30 mLs.  Give 2 Tbsp by mouth every 4 hours as needed for gas, indigestion, or upset stomach supervised self-administration Notify MD if no relief   amitriptyline (ELAVIL) 25 MG tablet Take 1 tablet (25 mg total) by mouth at bedtime.   apixaban (ELIQUIS) 2.5 MG TABS tablet Take 1 tablet (2.5 mg total) by mouth 2 (two) times daily.   ATROVENT HFA 17 MCG/ACT inhaler INHALE 2 PUFFS INTO THE LUNGS EVERY 6 HOURS AS NEEDED FOR WHEEZING   b complex vitamins tablet Take 1 tablet by mouth daily.   Biotin w/ Vitamins C & E 1250-7.5-7.5 MCG-MG-UNT CHEW One tablet by mouth daily.    bismuth subsalicylate (QC PINK BISMUTH) 262 MG/15ML suspension Take 10 mLs by mouth as directed. Every 24 hours as needed for loose stools   CALCIUM ALGINATE EX Apply to buttocks topicaly every other day for wound.   Cholecalciferol (VITAMIN D3) 2000 UNITS TABS Take 1,000 Units by mouth 2 (two) times a week. Tuesday and Saturdays.   conjugated estrogens (PREMARIN) vaginal cream Apply 0.50m (pea-sized amount)  just inside the vaginal introitus with a finger-tip on  Monday, Wednesday and Friday nights.   dextromethorphan-guaiFENesin (ROBITUSSIN-DM) 10-100 MG/5ML liquid Take 10 mLs by mouth every 4 (four) hours as needed for cough.   emollient (BIAFINE) cream Apply to affected areas topically every 12 hours as needed.   Evolocumab (REPATHA SURECLICK) [id number]/ML SOAJ INJECT 1 PEN EVERY 14 DAYS AS DIRECTED MUST SCHEDULE OFFICE VISIT FOR FURTHER REFILLS   ezetimibe (ZETIA) 10 MG tablet TAKE 1 TABLET BY MOUTH DAILY   furosemide (LASIX) 40 MG tablet Take 40 mg by mouth daily.   hydrocortisone 2.5 % cream APPLY TO LICHEN PLANUS AS DIRECTED   hydrocortisone cream (PREPARATION H) 1 % Apply 1 Application topically every 6 (six) hours as needed for itching. Apply to rectal area   Infant Care Products ([facility]) Apply to buttock topically twice daily as needed.   ketoconazole (NIZORAL) 2 % cream Apply 1 Application topically daily as needed for irritation (for bleeding fingers).   lisinopril (ZESTRIL) 10 MG tablet Take 10 mg by mouth daily.   loratadine (CLARITIN) 10 MG tablet Take 10 mg by mouth daily.   magnesium hydroxide (MILK OF MAGNESIA) 400 MG/5ML suspension Take 10 mLs by mouth daily as needed for mild constipation.   miconazole (MICOTIN) 2 % powder Apply 1 Application topically as directed. Apply to vagina every 24 hours as need for vaginal itching   miconazole (MONISTAT 7) 2 % vaginal cream Place 1 Applicatorful vaginally as directed. Every 24 hours as needed for lichen planus   Multiple  Vitamins-Minerals (HAIR/SKIN/NAILS PO) Take 1 tablet by mouth daily.   nystatin powder Apply 1 Application topically every 8 (eight) hours as needed.   ondansetron (ZOFRAN) 4 MG tablet Take 4 mg by mouth every 8 (eight) hours as needed for nausea or vomiting.   pantoprazole (PROTONIX) 40 MG tablet Take 1  tablet (40 mg total) by mouth 2 (two) times daily.   polyethylene glycol (MIRALAX / GLYCOLAX) 17 g packet Take 17 g by mouth daily. In the evening for bowel regimen   Protein (PROSOURCE PO) Take 1 Dose by mouth 2 (two) times daily. For wound healing   senna (SENOKOT) 8.6 MG tablet Take 2 tablets by mouth daily.   silver sulfADIAZINE (SILVADENE) 1 % cream Apply 1 Application topically 2 (two) times daily. For wound care   spironolactone (ALDACTONE) 25 MG tablet Take 1 tablet (25 mg total) by mouth every morning.   traMADol (ULTRAM) 50 MG tablet TAKE ONE TABLET TWICE DAILY AS NEEDED   traZODone (DESYREL) 150 MG tablet Two tablets by mouth at bedtime as needed for sleep   triamcinolone cream (KENALOG) 0.1 % Apply 1 Application topically every 12 (twelve) hours as needed (for rash).   verapamil (CALAN-SR) 180 MG CR tablet TAKE ONE (1) TABLET BY MOUTH TWO TIMES PER DAY   vitamin B-12 (CYANOCOBALAMIN) 1000 MCG tablet Take 1,000 mcg by mouth daily.   vitamin C (ASCORBIC ACID) 500 MG tablet Take 500 mg by mouth at bedtime.   [DISCONTINUED] Coenzyme Q10 (COQ-10) 200 MG CAPS Take 200 mg by mouth daily.    [DISCONTINUED] CRANBERRY PO Take 1 capsule by mouth 4 (four) times daily.   No facility-administered encounter medications on file as of 11/26/2022.    Review of Systems  Immunization History  Administered Date(s) Administered   Fluad Quad(high Dose 65+) 06/22/2021   Influenza, High Dose Seasonal PF 06/14/2016, 07/08/2018, 07/16/2019, 07/24/2022   Influenza,inj,Quad PF,6+ Mos 06/15/2014   Influenza-Unspecified 07/13/2015   Moderna Covid-19 Vaccine Bivalent Booster 62yr & up 06/30/2021, 08/17/2022    Moderna Sars-Covid-2 Vaccination 10/22/2019, 11/19/2019, 08/19/2020, 02/24/2021   Pneumococcal Conjugate-13 01/13/2015   Pneumococcal Polysaccharide-23 06/21/2009   Tdap 02/22/2010   Unspecified SARS-COV-2 Vaccination 08/17/2022   Zoster, Live 11/15/2010   Pertinent  Health Maintenance Due  Topic Date Due   DEXA SCAN  Never done   MAMMOGRAM  12/18/2019   COLONOSCOPY (Pts 45-475yrInsurance coverage will need to be confirmed)  03/19/2025   INFLUENZA VACCINE  Completed      06/21/2022    8:00 PM 06/22/2022   10:00 AM 06/27/2022   11:15 AM 09/10/2022   12:22 AM 09/20/2022    3:00 PM  FaKelln the past year?     1  Was there an injury with Fall?     0  Fall Risk Category Calculator     1  Fall Risk Category (Retired)     Low  (RETIRED) Patient Fall Risk Level High fall risk High fall risk High fall risk Moderate fall risk Moderate fall risk  Patient at Risk for Falls Due to   History of fall(s)  History of fall(s);Impaired balance/gait;Impaired mobility  Fall risk Follow up   Falls evaluation completed  Falls evaluation completed   Functional Status Survey:    Vitals:   11/26/22 1050  BP: (!) 92/59  Pulse: (!) 105  Resp: 17  Temp: 97.7 F (36.5 C)  SpO2: 98%  Weight: 171 lb 12.8 oz (77.9 kg)  Height: 5' 3"$  (1.6 m)   Body mass index is 30.43 kg/m. Physical Exam Cardiovascular:     Rate and Rhythm: Normal rate.     Pulses: Normal pulses.  Pulmonary:     Effort: Pulmonary effort is normal.  Neurological:     Mental Status: She is alert. Mental status is at  baseline.     Labs reviewed: Recent Labs    06/20/22 1831 06/22/22 0534 09/10/22 0025  NA 137 136 136  K 4.3 4.1 4.4  CL 101 104 103  CO2 25 23 26  $ GLUCOSE 108* 146* 184*  BUN 26* 21 66*  CREATININE 1.21* 0.88 1.13*  CALCIUM 9.7 9.8 10.1  MG  --  2.3  --    Recent Labs    05/10/22 1532 06/20/22 2136 09/10/22 0025  AST 21 27 21  $ ALT 18 21 23  $ ALKPHOS 50 54 50  BILITOT 0.5 1.0 0.8   PROT 6.6 6.9 7.2  ALBUMIN 4.4 4.2 4.0   Recent Labs    05/10/22 1532 06/20/22 1831 06/21/22 0835 09/10/22 0025  WBC 5.5 6.5 6.4 6.1  NEUTROABS 3.9  --   --  4.6  HGB 13.0 12.2 12.9 13.5  HCT 38.7 37.8 38.8 41.7  MCV 103.3* 104.1* 101.0* 106.1*  PLT 242.0 224 233 217   Lab Results  Component Value Date   TSH 1.58 01/11/2022   Lab Results  Component Value Date   HGBA1C 6.1 05/10/2022   Lab Results  Component Value Date   CHOL 121 05/10/2022   HDL 56.80 05/10/2022   LDLCALC 41 05/10/2022   LDLDIRECT 126.7 07/03/2013   TRIG 120.0 05/10/2022   CHOLHDL 2 05/10/2022    Significant Diagnostic Results in last 30 days:  No results found.  Assessment/Plan Major depressive disorder in partial remission, unspecified whether recurrent (HCC)  Chronic diastolic heart failure (HCC), Chronic  Peripheral arterial disease (HCC), Chronic  Persistent atrial fibrillation (HCC), Chronic  Atherosclerosis of native coronary artery of native heart with stable angina pectoris (HCC), Chronic  Stage 3a chronic kidney disease ([place]), Chronic  Stage 2 skin ulcer of sacral region [facility]) Patient with poor healing wounds of sacral region. Discussed need for change in management - patient amenable at this time althought previously declined. Add calcium alginate to sacral wound care.  Patient with low energy and fatigue - concern for anemia vs low BP contributing. Discussed concern for polypharmacy. Discontinue BP meds and lasix PRN only for swelling or weight gain. Consider dose reduction of trazodone. Continue eliquis. Continue elavil. Consider change to different depression medication. Encourage adequate PO intake and hydration. Continue protein supplementation.    Family/ staff Communication: nursing  Labs/tests ordered:  CBC, CMP, Vitamin D, B12

## 2022-11-28 DIAGNOSIS — F324 Major depressive disorder, single episode, in partial remission: Secondary | ICD-10-CM | POA: Insufficient documentation

## 2022-11-29 DIAGNOSIS — M6259 Muscle wasting and atrophy, not elsewhere classified, multiple sites: Secondary | ICD-10-CM | POA: Diagnosis not present

## 2022-11-29 DIAGNOSIS — I5032 Chronic diastolic (congestive) heart failure: Secondary | ICD-10-CM | POA: Diagnosis not present

## 2022-11-29 DIAGNOSIS — I130 Hypertensive heart and chronic kidney disease with heart failure and stage 1 through stage 4 chronic kidney disease, or unspecified chronic kidney disease: Secondary | ICD-10-CM | POA: Diagnosis not present

## 2022-11-29 DIAGNOSIS — D649 Anemia, unspecified: Secondary | ICD-10-CM | POA: Diagnosis not present

## 2022-11-29 DIAGNOSIS — E559 Vitamin D deficiency, unspecified: Secondary | ICD-10-CM | POA: Diagnosis not present

## 2022-12-04 ENCOUNTER — Emergency Department: Payer: Medicare Other

## 2022-12-04 ENCOUNTER — Inpatient Hospital Stay
Admission: EM | Admit: 2022-12-04 | Discharge: 2023-01-07 | DRG: 951 | Disposition: E | Payer: Medicare Other | Source: Skilled Nursing Facility | Attending: Obstetrics and Gynecology | Admitting: Obstetrics and Gynecology

## 2022-12-04 DIAGNOSIS — N170 Acute kidney failure with tubular necrosis: Secondary | ICD-10-CM | POA: Diagnosis present

## 2022-12-04 DIAGNOSIS — R000 Tachycardia, unspecified: Secondary | ICD-10-CM | POA: Diagnosis not present

## 2022-12-04 DIAGNOSIS — D539 Nutritional anemia, unspecified: Secondary | ICD-10-CM | POA: Diagnosis present

## 2022-12-04 DIAGNOSIS — Z961 Presence of intraocular lens: Secondary | ICD-10-CM | POA: Diagnosis present

## 2022-12-04 DIAGNOSIS — Z9842 Cataract extraction status, left eye: Secondary | ICD-10-CM

## 2022-12-04 DIAGNOSIS — J9601 Acute respiratory failure with hypoxia: Secondary | ICD-10-CM | POA: Diagnosis present

## 2022-12-04 DIAGNOSIS — I480 Paroxysmal atrial fibrillation: Secondary | ICD-10-CM | POA: Diagnosis present

## 2022-12-04 DIAGNOSIS — Z882 Allergy status to sulfonamides status: Secondary | ICD-10-CM

## 2022-12-04 DIAGNOSIS — R6521 Severe sepsis with septic shock: Secondary | ICD-10-CM | POA: Diagnosis present

## 2022-12-04 DIAGNOSIS — I739 Peripheral vascular disease, unspecified: Secondary | ICD-10-CM | POA: Diagnosis present

## 2022-12-04 DIAGNOSIS — J811 Chronic pulmonary edema: Secondary | ICD-10-CM | POA: Diagnosis not present

## 2022-12-04 DIAGNOSIS — Z515 Encounter for palliative care: Secondary | ICD-10-CM | POA: Diagnosis not present

## 2022-12-04 DIAGNOSIS — I2510 Atherosclerotic heart disease of native coronary artery without angina pectoris: Secondary | ICD-10-CM | POA: Diagnosis present

## 2022-12-04 DIAGNOSIS — Z8249 Family history of ischemic heart disease and other diseases of the circulatory system: Secondary | ICD-10-CM

## 2022-12-04 DIAGNOSIS — A419 Sepsis, unspecified organism: Secondary | ICD-10-CM | POA: Diagnosis not present

## 2022-12-04 DIAGNOSIS — I130 Hypertensive heart and chronic kidney disease with heart failure and stage 1 through stage 4 chronic kidney disease, or unspecified chronic kidney disease: Secondary | ICD-10-CM | POA: Diagnosis present

## 2022-12-04 DIAGNOSIS — N179 Acute kidney failure, unspecified: Secondary | ICD-10-CM | POA: Diagnosis present

## 2022-12-04 DIAGNOSIS — E871 Hypo-osmolality and hyponatremia: Secondary | ICD-10-CM | POA: Diagnosis present

## 2022-12-04 DIAGNOSIS — E875 Hyperkalemia: Secondary | ICD-10-CM | POA: Diagnosis present

## 2022-12-04 DIAGNOSIS — E785 Hyperlipidemia, unspecified: Secondary | ICD-10-CM | POA: Diagnosis present

## 2022-12-04 DIAGNOSIS — N3000 Acute cystitis without hematuria: Secondary | ICD-10-CM | POA: Diagnosis not present

## 2022-12-04 DIAGNOSIS — I10 Essential (primary) hypertension: Secondary | ICD-10-CM | POA: Diagnosis not present

## 2022-12-04 DIAGNOSIS — G9340 Encephalopathy, unspecified: Secondary | ICD-10-CM | POA: Diagnosis not present

## 2022-12-04 DIAGNOSIS — N1832 Chronic kidney disease, stage 3b: Secondary | ICD-10-CM | POA: Diagnosis present

## 2022-12-04 DIAGNOSIS — Z79899 Other long term (current) drug therapy: Secondary | ICD-10-CM

## 2022-12-04 DIAGNOSIS — I959 Hypotension, unspecified: Secondary | ICD-10-CM | POA: Diagnosis not present

## 2022-12-04 DIAGNOSIS — F418 Other specified anxiety disorders: Secondary | ICD-10-CM | POA: Diagnosis present

## 2022-12-04 DIAGNOSIS — I7300 Raynaud's syndrome without gangrene: Secondary | ICD-10-CM | POA: Diagnosis present

## 2022-12-04 DIAGNOSIS — I5032 Chronic diastolic (congestive) heart failure: Secondary | ICD-10-CM | POA: Diagnosis present

## 2022-12-04 DIAGNOSIS — A412 Sepsis due to unspecified staphylococcus: Secondary | ICD-10-CM | POA: Diagnosis present

## 2022-12-04 DIAGNOSIS — J45909 Unspecified asthma, uncomplicated: Secondary | ICD-10-CM | POA: Diagnosis present

## 2022-12-04 DIAGNOSIS — F419 Anxiety disorder, unspecified: Secondary | ICD-10-CM | POA: Diagnosis present

## 2022-12-04 DIAGNOSIS — Z818 Family history of other mental and behavioral disorders: Secondary | ICD-10-CM

## 2022-12-04 DIAGNOSIS — Z7901 Long term (current) use of anticoagulants: Secondary | ICD-10-CM

## 2022-12-04 DIAGNOSIS — R0902 Hypoxemia: Secondary | ICD-10-CM | POA: Diagnosis not present

## 2022-12-04 DIAGNOSIS — G9341 Metabolic encephalopathy: Secondary | ICD-10-CM | POA: Diagnosis present

## 2022-12-04 DIAGNOSIS — Z66 Do not resuscitate: Secondary | ICD-10-CM | POA: Diagnosis present

## 2022-12-04 DIAGNOSIS — Z880 Allergy status to penicillin: Secondary | ICD-10-CM

## 2022-12-04 DIAGNOSIS — Z886 Allergy status to analgesic agent status: Secondary | ICD-10-CM

## 2022-12-04 DIAGNOSIS — R410 Disorientation, unspecified: Secondary | ICD-10-CM | POA: Diagnosis not present

## 2022-12-04 DIAGNOSIS — Z87891 Personal history of nicotine dependence: Secondary | ICD-10-CM

## 2022-12-04 DIAGNOSIS — N390 Urinary tract infection, site not specified: Principal | ICD-10-CM

## 2022-12-04 DIAGNOSIS — Z7189 Other specified counseling: Secondary | ICD-10-CM | POA: Diagnosis not present

## 2022-12-04 DIAGNOSIS — Z1152 Encounter for screening for COVID-19: Secondary | ICD-10-CM

## 2022-12-04 DIAGNOSIS — Z955 Presence of coronary angioplasty implant and graft: Secondary | ICD-10-CM

## 2022-12-04 DIAGNOSIS — E559 Vitamin D deficiency, unspecified: Secondary | ICD-10-CM | POA: Diagnosis present

## 2022-12-04 DIAGNOSIS — Z85820 Personal history of malignant melanoma of skin: Secondary | ICD-10-CM

## 2022-12-04 DIAGNOSIS — Z8719 Personal history of other diseases of the digestive system: Secondary | ICD-10-CM

## 2022-12-04 DIAGNOSIS — F32A Depression, unspecified: Secondary | ICD-10-CM | POA: Diagnosis present

## 2022-12-04 DIAGNOSIS — R6520 Severe sepsis without septic shock: Secondary | ICD-10-CM | POA: Diagnosis not present

## 2022-12-04 DIAGNOSIS — R739 Hyperglycemia, unspecified: Secondary | ICD-10-CM | POA: Diagnosis not present

## 2022-12-04 DIAGNOSIS — Z9841 Cataract extraction status, right eye: Secondary | ICD-10-CM

## 2022-12-04 DIAGNOSIS — Z888 Allergy status to other drugs, medicaments and biological substances status: Secondary | ICD-10-CM

## 2022-12-04 DIAGNOSIS — Z8711 Personal history of peptic ulcer disease: Secondary | ICD-10-CM

## 2022-12-04 DIAGNOSIS — Z9049 Acquired absence of other specified parts of digestive tract: Secondary | ICD-10-CM

## 2022-12-04 DIAGNOSIS — Z951 Presence of aortocoronary bypass graft: Secondary | ICD-10-CM

## 2022-12-04 LAB — CBC WITH DIFFERENTIAL/PLATELET
Abs Immature Granulocytes: 0.08 10*3/uL — ABNORMAL HIGH (ref 0.00–0.07)
Basophils Absolute: 0 10*3/uL (ref 0.0–0.1)
Basophils Relative: 0 %
Eosinophils Absolute: 0 10*3/uL (ref 0.0–0.5)
Eosinophils Relative: 0 %
HCT: 27.7 % — ABNORMAL LOW (ref 36.0–46.0)
Hemoglobin: 8.5 g/dL — ABNORMAL LOW (ref 12.0–15.0)
Immature Granulocytes: 1 %
Lymphocytes Relative: 1 %
Lymphs Abs: 0.2 10*3/uL — ABNORMAL LOW (ref 0.7–4.0)
MCH: 31.4 pg (ref 26.0–34.0)
MCHC: 30.7 g/dL (ref 30.0–36.0)
MCV: 102.2 fL — ABNORMAL HIGH (ref 80.0–100.0)
Monocytes Absolute: 1.4 10*3/uL — ABNORMAL HIGH (ref 0.1–1.0)
Monocytes Relative: 8 %
Neutro Abs: 15.3 10*3/uL — ABNORMAL HIGH (ref 1.7–7.7)
Neutrophils Relative %: 90 %
Platelets: 309 10*3/uL (ref 150–400)
RBC: 2.71 MIL/uL — ABNORMAL LOW (ref 3.87–5.11)
RDW: 13.7 % (ref 11.5–15.5)
Smear Review: NORMAL
WBC Morphology: INCREASED
WBC: 16.9 10*3/uL — ABNORMAL HIGH (ref 4.0–10.5)
nRBC: 0 % (ref 0.0–0.2)

## 2022-12-04 LAB — URINALYSIS, W/ REFLEX TO CULTURE (INFECTION SUSPECTED)
Bilirubin Urine: NEGATIVE
Glucose, UA: NEGATIVE mg/dL
Hgb urine dipstick: NEGATIVE
Ketones, ur: NEGATIVE mg/dL
Nitrite: NEGATIVE
Protein, ur: NEGATIVE mg/dL
Specific Gravity, Urine: 1.015 (ref 1.005–1.030)
Squamous Epithelial / HPF: NONE SEEN /HPF (ref 0–5)
pH: 5 (ref 5.0–8.0)

## 2022-12-04 LAB — RESP PANEL BY RT-PCR (RSV, FLU A&B, COVID)  RVPGX2
Influenza A by PCR: NEGATIVE
Influenza B by PCR: NEGATIVE
Resp Syncytial Virus by PCR: NEGATIVE
SARS Coronavirus 2 by RT PCR: NEGATIVE

## 2022-12-04 LAB — COMPREHENSIVE METABOLIC PANEL
ALT: 17 U/L (ref 0–44)
AST: 34 U/L (ref 15–41)
Albumin: 2.7 g/dL — ABNORMAL LOW (ref 3.5–5.0)
Alkaline Phosphatase: 51 U/L (ref 38–126)
Anion gap: 11 (ref 5–15)
BUN: 85 mg/dL — ABNORMAL HIGH (ref 8–23)
CO2: 17 mmol/L — ABNORMAL LOW (ref 22–32)
Calcium: 8.9 mg/dL (ref 8.9–10.3)
Chloride: 99 mmol/L (ref 98–111)
Creatinine, Ser: 1.97 mg/dL — ABNORMAL HIGH (ref 0.44–1.00)
GFR, Estimated: 25 mL/min — ABNORMAL LOW (ref >60)
Glucose, Bld: 270 mg/dL — ABNORMAL HIGH (ref 70–99)
Potassium: 6.3 mmol/L (ref 3.5–5.1)
Sodium: 127 mmol/L — ABNORMAL LOW (ref 135–145)
Total Bilirubin: 0.7 mg/dL (ref 0.3–1.2)
Total Protein: 5.3 g/dL — ABNORMAL LOW (ref 6.5–8.1)

## 2022-12-04 LAB — BRAIN NATRIURETIC PEPTIDE: B Natriuretic Peptide: 369.6 pg/mL — ABNORMAL HIGH (ref 0.0–100.0)

## 2022-12-04 LAB — APTT: aPTT: 21 seconds — ABNORMAL LOW (ref 24–36)

## 2022-12-04 LAB — PROTIME-INR
INR: 1.4 — ABNORMAL HIGH (ref 0.8–1.2)
Prothrombin Time: 17.3 seconds — ABNORMAL HIGH (ref 11.4–15.2)

## 2022-12-04 LAB — LACTIC ACID, PLASMA: Lactic Acid, Venous: 4.4 mmol/L (ref 0.5–1.9)

## 2022-12-04 MED ORDER — SODIUM CHLORIDE 0.9 % IV SOLN
2.0000 g | Freq: Once | INTRAVENOUS | Status: AC
  Administered 2022-12-04: 2 g via INTRAVENOUS
  Filled 2022-12-04: qty 12.5

## 2022-12-04 MED ORDER — INSULIN ASPART 100 UNIT/ML IJ SOLN
5.0000 [IU] | Freq: Once | INTRAMUSCULAR | Status: DC
  Filled 2022-12-04: qty 1

## 2022-12-04 MED ORDER — SODIUM ZIRCONIUM CYCLOSILICATE 10 G PO PACK
10.0000 g | PACK | Freq: Once | ORAL | Status: DC
  Filled 2022-12-04: qty 1

## 2022-12-04 MED ORDER — SODIUM CHLORIDE 0.9 % IV BOLUS
1000.0000 mL | Freq: Once | INTRAVENOUS | Status: AC
  Administered 2022-12-04: 1000 mL via INTRAVENOUS

## 2022-12-04 MED ORDER — CALCIUM GLUCONATE-NACL 1-0.675 GM/50ML-% IV SOLN: 1.0000 g | Freq: Once | INTRAVENOUS | Status: DC

## 2022-12-04 MED ORDER — VANCOMYCIN HCL 1500 MG/300ML IV SOLN
1500.0000 mg | Freq: Once | INTRAVENOUS | Status: DC
  Filled 2022-12-04: qty 300

## 2022-12-04 MED ORDER — SODIUM CHLORIDE 0.9 % IV BOLUS (SEPSIS)
1000.0000 mL | Freq: Once | INTRAVENOUS | Status: AC
  Administered 2022-12-04: 1000 mL via INTRAVENOUS

## 2022-12-04 MED ORDER — NOREPINEPHRINE 4 MG/250ML-% IV SOLN: 2.0000 ug/min | INTRAVENOUS | Status: DC

## 2022-12-04 MED ORDER — MORPHINE SULFATE (PF) 2 MG/ML IV SOLN
2.0000 mg | Freq: Once | INTRAVENOUS | Status: AC
  Administered 2022-12-04: 2 mg via INTRAVENOUS
  Filled 2022-12-04: qty 1

## 2022-12-04 MED ORDER — DEXTROSE 50 % IV SOLN
1.0000 | Freq: Once | INTRAVENOUS | Status: DC
  Filled 2022-12-04: qty 50

## 2022-12-04 MED ORDER — METRONIDAZOLE 500 MG/100ML IV SOLN
500.0000 mg | Freq: Once | INTRAVENOUS | Status: AC
  Administered 2022-12-04: 500 mg via INTRAVENOUS
  Filled 2022-12-04: qty 100

## 2022-12-04 MED ORDER — SCOPOLAMINE 1 MG/3DAYS TD PT72
1.0000 | MEDICATED_PATCH | TRANSDERMAL | Status: DC
  Administered 2022-12-04: 1.5 mg via TRANSDERMAL
  Filled 2022-12-04 (×3): qty 1

## 2022-12-04 MED ORDER — NOREPINEPHRINE 4 MG/250ML-% IV SOLN
INTRAVENOUS | Status: AC
  Administered 2022-12-04: 2 ug/min via INTRAVENOUS
  Filled 2022-12-04: qty 250

## 2022-12-04 MED ORDER — SODIUM CHLORIDE 0.9 % IV SOLN: 250.0000 mL | INTRAVENOUS | Status: DC

## 2022-12-04 MED ORDER — FENTANYL CITRATE PF 50 MCG/ML IJ SOSY
25.0000 ug | PREFILLED_SYRINGE | INTRAMUSCULAR | Status: DC | PRN
  Administered 2022-12-04 – 2022-12-05 (×7): 50 ug via INTRAVENOUS
  Filled 2022-12-04 (×7): qty 1

## 2022-12-04 MED ORDER — NAPHAZOLINE-GLYCERIN 0.012-0.25 % OP SOLN
1.0000 [drp] | Freq: Four times a day (QID) | OPHTHALMIC | Status: DC | PRN
  Administered 2022-12-06 (×2): 2 [drp] via OPHTHALMIC
  Filled 2022-12-04: qty 15

## 2022-12-04 MED ORDER — VANCOMYCIN HCL IN DEXTROSE 1-5 GM/200ML-% IV SOLN
1000.0000 mg | Freq: Once | INTRAVENOUS | Status: DC
  Filled 2022-12-04: qty 200

## 2022-12-04 MED ORDER — LORAZEPAM 2 MG/ML IJ SOLN
1.0000 mg | INTRAMUSCULAR | Status: DC | PRN
  Administered 2022-12-04 – 2022-12-06 (×4): 1 mg via INTRAVENOUS
  Filled 2022-12-04 (×4): qty 1

## 2022-12-04 NOTE — Progress Notes (Signed)
Elink following code sepsis °

## 2022-12-04 NOTE — ED Provider Notes (Signed)
[facility] Provider Note    Event Date/Time   First MD Initiated Contact with Patient 11/10/2022 1246     (approximate)   History   Altered Mental Status (Pt. To ED via EMS for low BP (88/48) and AMS at facility, [facility]. Facility states pt. Has been progressively more altered today, and BP low prior to Mult[employer]. Pt. 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 History initially provided mostly by EMS. Per report, pt has had increasing confusion over the past 2 days or so. They checked her BP and it was 70s/40s, so she presents for evaluation. On my assessment, pt confused, weak, unable to understand speech. H/o UTIs. Pt is DNR. Per review of PCP records pt has had progressvie functional decline and confusion.  Level 5 caveat invoked as remainder of history, ROS, and physical exam limited due to patient's ams.        Physical Exam   Triage Vital Signs: ED Triage Vitals [11/30/2022 1246]  Enc Vitals Group     BP (!) 77/48     Pulse Rate (!) 111     Resp (!) 24     Temp      Temp src      SpO2 93 %     Weight      Height      Head Circumference      Peak Flow      Pain Score      Pain Loc      Pain Edu?      Excl. in [place]?     Most recent vital signs: Vitals:   11/19/2022 1645 11/23/2022 1700  BP:    Pulse: (!) 102 (!) 114  Resp: (!) 27   Temp: 98.6 F (37 C) 98.7 F (37.1 C)  SpO2: 100% 97%     General: Awake, moderate distress, appears unwell. CV:  Decreased peripheral perfusion. Tachycardia. No murmurs. Resp:  Mild tachypnea but breath sounds clear. Abd:  No distention. No tenderness. Other:  Stage II/III decub ulcers noted, mild erythema/induration.   ED Results / Procedures / Treatments   Labs (all labs ordered are listed, but only abnormal results are displayed) Labs Reviewed  LACTIC ACID, PLASMA - Abnormal; Notable for the following components:      Result Value   Lactic Acid, Venous  4.4 (*)    All other components within normal limits  COMPREHENSIVE METABOLIC PANEL - Abnormal; Notable for the following components:   Sodium 127 (*)    Potassium 6.3 (*)    CO2 17 (*)    Glucose, Bld 270 (*)    BUN 85 (*)    Creatinine, Ser 1.97 (*)    Total Protein 5.3 (*)    Albumin 2.7 (*)    GFR, Estimated 25 (*)    All other components within normal limits  CBC WITH DIFFERENTIAL/PLATELET - Abnormal; Notable for the following components:   WBC 16.9 (*)    RBC 2.71 (*)    Hemoglobin 8.5 (*)    HCT 27.7 (*)    MCV 102.2 (*)    Neutro Abs 15.3 (*)    Lymphs Abs 0.2 (*)    Monocytes Absolute 1.4 (*)    Abs Immature Granulocytes 0.08 (*)    All other components within normal limits  PROTIME-INR - Abnormal; Notable for the following components:   Prothrombin Time 17.3 (*)  INR 1.4 (*)    All other components within normal limits  APTT - Abnormal; Notable for the following components:   aPTT 21 (*)    All other components within normal limits  BRAIN NATRIURETIC PEPTIDE - Abnormal; Notable for the following components:   B Natriuretic Peptide 369.6 (*)    All other components within normal limits  URINALYSIS, W/ REFLEX TO CULTURE (INFECTION SUSPECTED) - Abnormal; Notable for the following components:   Color, Urine YELLOW (*)    APPearance HAZY (*)    Leukocytes,Ua SMALL (*)    Bacteria, UA MANY (*)    All other components within normal limits  RESP PANEL BY RT-PCR (RSV, FLU A&B, COVID)  [id number]  CULTURE, BLOOD (ROUTINE X 2)  CULTURE, BLOOD (ROUTINE X 2)     EKG AFib, VR 116. QRS 109,QTc 419. LAFB. No acute St elevations or depressions.   RADIOLOGY CXR: Cardiomegaly with vascular congestion   I also independently reviewed and agree with radiologist interpretations.   PROCEDURES:  Critical Care performed: Yes, see critical care procedure note(s)  .Critical Care  Performed by: Duffy Bruce, MD Authorized by: Duffy Bruce, MD   Critical care provider  statement:    Critical care time (minutes):  30   Critical care time was exclusive of:  Separately billable procedures and treating other patients   Critical care was necessary to treat or prevent imminent or life-threatening deterioration of the following conditions:  Circulatory failure, cardiac failure, respiratory failure and shock   Critical care was time spent personally by me on the following activities:  Development of treatment plan with patient or surrogate, discussions with consultants, evaluation of patient's response to treatment, examination of patient, ordering and review of laboratory studies, ordering and review of radiographic studies, ordering and performing treatments and interventions, pulse oximetry, re-evaluation of patient's condition and review of old [place] ED: Medications  fentaNYL (SUBLIMAZE) injection 25-50 mcg (50 mcg Intravenous Given 12/05/2022 1716)  morphine (PF) 2 MG/ML injection 2 mg (has no administration in time range)  LORazepam (ATIVAN) injection 1 mg (1 mg Intravenous Given 11/19/2022 1727)  scopolamine (TRANSDERM-SCOP) 1 MG/3DAYS 1.5 mg (has no administration in time range)  naphazoline-glycerin (CLEAR EYES REDNESS) ophth solution 1-2 drop (has no administration in time range)  ceFEPIme (MAXIPIME) 2 g in sodium chloride 0.9 % 100 mL IVPB (0 g Intravenous Stopped 11/18/2022 1323)  metroNIDAZOLE (FLAGYL) IVPB 500 mg (0 mg Intravenous Stopped 11/21/2022 1437)  sodium chloride 0.9 % bolus 1,000 mL (0 mLs Intravenous Stopped 11/28/2022 1400)  sodium chloride 0.9 % bolus 1,000 mL (0 mLs Intravenous Stopped 11/09/2022 1437)     IMPRESSION / MDM / [place] / ED COURSE  I reviewed the triage vital signs and the nursing notes.                              Differential diagnosis includes, but is not limited to, septic shock, cardiogenic shock, acute kidney injury, polypharmacy  Patient's presentation is most consistent with acute  presentation with potential threat to life or bodily function.  The patient is on the cardiac monitor to evaluate for evidence of arrhythmia and/or significant heart rate changes  87 yo F here with PMHx of CAD, CKD, HTN, GIB, recurrent UTIs, here with acute confusion, weakness. Pt hypotensive, tachycardic on arrival. Sepsis protocol initiated but will be cautious with fluids as pt has h/o CHF  as well as DNR status. Suspect UTI vs PNA. LA 4.4 c/w septic shock. CMP shows AKI with hyperkalemia and acidosis - this was temporized and IVF given. WBC 16. C/w sepsis. Hgb 8.5, query possible stress gastritis/bleeding. COVID negative. CXR shows no acute PNA. EKG with demand but no ST elevations. UA noted to have pyuria.  Suspect severe sepsis/septic shock, with acute encephalopathy. Initially, discussed with pt's son and HCPOA and plan was for medical but noninvasive treatment while awaiting response. However, pt began to c/o increasing pain at her sacral decub sites and seemed increasingly uncomfortable. Given age, comorbidities, labs, shared decision making with pt, son, and daughter held with Dr. Mortimer Fries of ICU and decision made ot transfer to comfort care. Will admit to hospitalist.     FINAL CLINICAL IMPRESSION(S) / ED DIAGNOSES   Final diagnoses:  Lower urinary tract infectious disease  Acute encephalopathy  Septic shock ([place])     Rx / DC Orders   ED Discharge Orders     None        Note:  This document was prepared using Dragon voice recognition software and may include unintentional dictation errors.   Duffy Bruce, MD 11/11/2022 [phone]

## 2022-12-04 NOTE — ED Notes (Signed)
Patient taken to inpatient room at this time. Patient alert with eyes open, respirations even. Family updated on plan of care at this time. All questions answered.

## 2022-12-04 NOTE — H&P (Signed)
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  Per her daughter at the bedside, pt has had increasing confusion over the past several days. Patient was found to have hypotension. Patient has been confused in ED, but intermittently becomes a little more clear. When I saw pt in ED, she seems to know her own name, and knows that she is in the hospital at one time, but then becoming confused again. She is not orientated to time.  She moves all extremities.  She has dry cough and SOB, no nausea, vomiting, diarrhea noted.  Not sure if patient has symptoms of UTI.  Does not seem to have chest pain or abdominal pain.  Her blood pressure is down to 62/22 in ED. After giving 2 L normal saline bolus, her blood pressure is 82/50 in ED. Dr. Mortimer Fries of ICU was consulted. He had extensive discussion with the family, decision was made for comfort care.  Data reviewed independently and ED Course: pt was found to have WBC 16.9, worsening anemia with hemoglobin 8.5 (hemoglobin 13.5 on 09/10/2022), urinalysis (hazy appearance, small amount of leukocyte, many bacteria, WBC 6-10), negative PCR for COVID, flu and RBC.  Sodium 127, potassium 6.3, bicarbonate 17, worsening renal function with creatinine 1.97, BUN 85, GFR 25 (baseline creatinine 1.13 on 09/10/2022), INR 1.4, PTT 21, BNP 369.  Temperature normal, heart rate 137 -->101, RR 30 --> 23, oxygen saturation 95% on room air.  Chest x-ray showed cardiomegaly  and vascular congestion.  Patient is admitted to [place] bed for comfort care measures only.   EKG: I have personally reviewed.  Atrial fibrillation, QTc 419, LAD, poor R wave progression   Review of Systems: Could not reviewed accurately due to altered mental status  Allergy:  Allergies  Allergen Reactions   Beta Adrenergic Blockers Shortness Of Breath    DEPRESSION/HYPOTENSION   Brilinta [Ticagrelor] Shortness Of Breath    Caused AFIB   Albuterol     rigors   Antihistamines, Diphenhydramine-Type     Muscle spasms   Aspirin Other (See Comments)    Heartburn    Nsaids Other (See Comments)    ULCER HISTORY & CURRENTLY ON BLOOD THINNERS   Penicillins Hives and Other (See Comments)    Has patient had a PCN reaction causing immediate rash, facial/tongue/throat swelling, SOB or lightheadedness with hypotension: No Has patient had a PCN reaction causing severe rash involving mucus membranes or skin necrosis: No Has patient had a PCN reaction that required hospitalization No Has patient had a PCN reaction occurring within the last 10 years: No If all of the above answers are "NO", then may proceed with Cephalosporin use.    Statins Other (See Comments)    Muscle weakness   Sulfasalazine Rash    At mouth   Tetracycline Rash   Tetracyclines & Related Rash    Past Medical History:  Diagnosis Date   Acute GI bleeding 03/16/2020   Acute on chronic diastolic CHF (congestive heart failure) ([place]) 10/08/2016   Allergy  Arthritis    s/p bilateral knees and left hip replacement   Asthma    Benign essential hypertension 06/21/2013   Formatting of this note might be different from the original. Last Assessment & Plan:  Blood pressure doing well.  Follow.  Check metabolic panel.   CAD (coronary artery disease)    a. 12/1996 s/p CABG x4 ([place]);  b. 2005 Pt reports stress test & cath, which revealed patent grafts.   Cancer [facility])    melanoma right arm   Carotid arterial disease  ([place])    a. 04/2015 Carotid U/S: <50% bilat ICA stenosis.   Chronic kidney disease    Colon polyps    H/O   Depression    GERD (gastroesophageal reflux disease)    h/o hiatal hernia   Headache    migraines in past   Heart murmur    a. 04/2011 Echo: EF 55-60%, bilat atrial enlargement, mild to mod TR.   History of chicken pox    History of hiatal hernia    Hx of migraines    rare now   Hx: UTI (urinary tract infection)    Hyperlipidemia    Hypertension    Hypertensive heart disease    Lichen planus    Melanoma (HCC)    Palpitations    a. rare PVC's and h/o SVT.   PMR (polymyalgia rheumatica) (HCC)    h/o in setting of crestor usage.   PSVT (paroxysmal supraventricular tachycardia)    PUD (peptic ulcer disease)    remote history   Raynaud's phenomenon    Spinal stenosis    Urine incontinence    H/O   Vitamin D deficiency     Past Surgical History:  Procedure Laterality Date   ADENOIDECTOMY     age 83   BACK SURGERY     L3-L5   BILATERAL CARPAL TUNNEL RELEASE     BREAST BIOPSY Right    bx x 3 neg   BREAST SURGERY Right    biopsy x 3 (all benign)   CARDIAC CATHETERIZATION N/A 06/01/2016   Procedure: LEFT HEART CATH AND CORS/GRAFTS ANGIOGRAPHY;  Surgeon: Wellington Hampshire, MD;  Location: [place] CV LAB;  Service: Cardiovascular;  Laterality: N/A;   CARDIAC CATHETERIZATION N/A 06/01/2016   Procedure: Coronary Stent Intervention;  Surgeon: Wellington Hampshire, MD;  Location: [place] CV LAB;  Service: Cardiovascular;  Laterality: N/A;   CARDIOVERSION N/A 03/08/2017   Procedure: CARDIOVERSION;  Surgeon: Wellington Hampshire, MD;  Location: [facility] ORS;  Service: Cardiovascular;  Laterality: N/A;   CATARACT EXTRACTION W/PHACO Right 01/04/2016   Procedure: CATARACT EXTRACTION PHACO AND INTRAOCULAR LENS PLACEMENT ([place]);  Surgeon: Leandrew Koyanagi, MD;  Location: [place];  Service: Ophthalmology;  Laterality: Right;  MALYUGIN   CATARACT EXTRACTION W/PHACO Left  01/25/2016   Procedure: CATARACT EXTRACTION PHACO AND INTRAOCULAR LENS PLACEMENT ([place]) left eye;  Surgeon: Leandrew Koyanagi, MD;  Location: [place];  Service: Ophthalmology;  Laterality: Left;  MALYUGIN SHUGARCAINE   CHOLECYSTECTOMY  90's   COLONOSCOPY  03/19/2020   Procedure: COLONOSCOPY;  Surgeon: Virgel Manifold, MD;  Location: [facility] ENDOSCOPY;  Service: Gastroenterology;;   COLONOSCOPY WITH PROPOFOL N/A 05/03/2015   Procedure: COLONOSCOPY WITH PROPOFOL;  Surgeon: Lollie Sails, MD;  Location: [facility] ENDOSCOPY;  Service: Endoscopy;  Laterality: N/A;   COLONOSCOPY WITH PROPOFOL N/A 03/19/2020   Procedure: COLONOSCOPY WITH PROPOFOL;  Surgeon: Virgel Manifold, MD;  Location: [facility] ENDOSCOPY;  Service: Endoscopy;  Laterality: N/A;  CORONARY ARTERY BYPASS GRAFT  98   4 vessel   CORONARY ARTERY BYPASS GRAFT     x 4 age 62 per pt    CORONARY STENT INTERVENTION N/A 03/31/2018   Procedure: CORONARY STENT INTERVENTION;  Surgeon: Wellington Hampshire, MD;  Location: [place] CV LAB;  Service: Cardiovascular;  Laterality: N/A;   ESOPHAGOGASTRODUODENOSCOPY N/A 03/22/2015   Procedure: ESOPHAGOGASTRODUODENOSCOPY (EGD);  Surgeon: Lollie Sails, MD;  Location: [facility] ENDOSCOPY;  Service: Endoscopy;  Laterality: N/A;   ESOPHAGOGASTRODUODENOSCOPY N/A 03/19/2020   Procedure: ESOPHAGOGASTRODUODENOSCOPY (EGD);  Surgeon: Virgel Manifold, MD;  Location: [facility] ENDOSCOPY;  Service: Endoscopy;  Laterality: N/A;   HEMORRHOID BANDING     HEMORRHOID SURGERY N/A 02/17/2018   Procedure: HEMORRHOIDECTOMY;  Surgeon: Robert Bellow, MD;  Location: [facility] ORS;  Service: General;  Laterality: N/A;   JOINT REPLACEMENT     BILATERAL KNEE REPLACEMENTS   KNEE ARTHROSCOPY W/ OATS PROCEDURE     Lt knee (9/01), Rt knee (3/11), Lt hip (5/10)   LEFT HEART CATH AND CORONARY ANGIOGRAPHY N/A 03/31/2018   Procedure: LEFT HEART CATH AND CORONARY ANGIOGRAPHY;  Surgeon: Wellington Hampshire, MD;  Location: [place] CV LAB;  Service: Cardiovascular;  Laterality: N/A;   TOTAL HIP ARTHROPLASTY      Social History:  reports that she has quit smoking. Her smoking use included cigarettes. She has never used smokeless tobacco. She reports that she does not drink alcohol and does not use drugs.  Family History:  Family History  Problem Relation Age of Onset   Thyroid disease Mother        graves disease   Heart disease Father        rheumatic heart   Alcohol abuse Father    Depression Father    Lung cancer Sister    Depression Daughter    Thyroid disease Daughter        hashimoto   Depression Son    Stroke Maternal Grandmother    Depression Maternal Grandmother    Hypertension Maternal Grandfather    Hypertension Paternal Grandfather    Seizures Son    Breast cancer Neg Hx      Prior to Admission medications   Medication Sig Start Date End Date Taking? Authorizing Provider  acetaminophen (TYLENOL) 325 MG tablet Take 650 mg by mouth in the morning and at bedtime.   Yes [provider]  aluminum-magnesium hydroxide-simethicone (MAALOX) [id number] MG/5ML SUSP 30 mLs.  Give 2 Tbsp by mouth every 4 hours as needed for gas, indigestion, or upset stomach supervised self-administration Notify MD if no relief   Yes [provider]  amitriptyline (ELAVIL) 25 MG tablet Take 1 tablet (25 mg total) by mouth at bedtime. 09/20/22  Yes [name], MD  apixaban (ELIQUIS) 2.5 MG TABS tablet Take 1 tablet (2.5 mg total) by mouth 2 (two) times daily. 08/08/22  Yes Dewayne Shorter, MD  b complex vitamins tablet Take 1 tablet by mouth daily.   Yes [provider]  Biotin w/ Vitamins C & E 1250-7.5-7.5 MCG-MG-UNT CHEW One tablet by mouth daily.   Yes [provider]  bismuth subsalicylate (QC PINK BISMUTH) 262 MG/15ML suspension Take 10 mLs by mouth as directed. Every 24 hours as needed for loose stools   Yes [provider]  Cholecalciferol (VITAMIN D3) 2000  UNITS TABS Take 1,000 Units by mouth 2 (two) times a week. Tuesday and Saturdays.   Yes [provider]  Cranberry 400 MG TABS Take 1  tablet by mouth in the morning, at noon, in the evening, and at bedtime.   Yes [provider]  dextromethorphan-guaiFENesin (ROBITUSSIN-DM) 10-100 MG/5ML liquid Take 10 mLs by mouth every 4 (four) hours as needed for cough.   Yes [provider]  emollient (BIAFINE) cream Apply to affected areas topically every 12 hours as needed.   Yes [provider]  Evolocumab (REPATHA SURECLICK) [id number] MG/ML SOAJ INJECT 1 PEN EVERY 14 DAYS AS DIRECTED MUST SCHEDULE OFFICE VISIT FOR FURTHER REFILLS 09/03/22  Yes Gollan, Kathlene November, MD  ezetimibe (ZETIA) 10 MG tablet TAKE 1 TABLET BY MOUTH DAILY 04/25/22  Yes Gollan, Kathlene November, MD  furosemide (LASIX) 40 MG tablet Take 40 mg by mouth daily.   Yes [provider]  hydrocortisone 2.5 % cream APPLY TO LICHEN PLANUS AS DIRECTED 06/15/22  Yes [name], MD  hydrocortisone cream (PREPARATION H) 1 % Apply 1 Application topically every 6 (six) hours as needed for itching. Apply to rectal area   Yes [provider]  [facility] EX) Apply to buttock topically twice daily as needed.   Yes [provider]  lisinopril (ZESTRIL) 2.5 MG tablet Take 2.5 mg by mouth daily.   Yes [provider]  loratadine (CLARITIN) 10 MG tablet Take 10 mg by mouth daily.   Yes [provider]  magnesium hydroxide (MILK OF MAGNESIA) 400 MG/5ML suspension Take 10 mLs by mouth daily as needed for mild constipation.   Yes [provider]  miconazole (MICOTIN) 2 % powder Apply 1 Application topically as directed. Apply to vagina every 24 hours as need for vaginal itching   Yes [provider]  miconazole (MONISTAT 7) 2 % vaginal cream Place 1 Applicatorful vaginally as directed. Mon.wed,fri   Yes [provider]  nystatin powder Apply 1  Application topically every 8 (eight) hours as needed.   Yes [provider]  ondansetron (ZOFRAN) 4 MG tablet Take 4 mg by mouth every 8 (eight) hours as needed for nausea or vomiting.   Yes [provider]  pantoprazole (PROTONIX) 40 MG tablet Take 1 tablet (40 mg total) by mouth 2 (two) times daily. 09/20/22  Yes [name], MD  polyethylene glycol (MIRALAX / GLYCOLAX) 17 g packet Take 17 g by mouth daily. In the evening for bowel regimen   Yes [provider]  senna (SENOKOT) 8.6 MG tablet Take 1 tablet by mouth. Mon.wed,fri   Yes [provider]  traZODone (DESYREL) 150 MG tablet Two tablets by mouth at bedtime as needed for sleep 09/20/22  Yes [name], MD  verapamil (CALAN-SR) 180 MG CR tablet TAKE ONE (1) TABLET BY MOUTH TWO TIMES PER DAY 09/20/22  Yes [name], MD  vitamin B-12 (CYANOCOBALAMIN) 1000 MCG tablet Take 1,000 mcg by mouth daily.   Yes [provider]  vitamin C (ASCORBIC ACID) 500 MG tablet Take 500 mg by mouth at bedtime.   Yes [provider]  acetaminophen (TYLENOL) 500 MG tablet Take 1,000 mg by mouth 2 (two) times daily.    [provider]  ATROVENT HFA 17 MCG/ACT inhaler INHALE 2 PUFFS INTO THE LUNGS EVERY 6 HOURS AS NEEDED FOR WHEEZING 10/11/21   [name], MD  CALCIUM ALGINATE EX Apply to buttocks topicaly every other day for wound.    [provider]  conjugated estrogens (PREMARIN) vaginal cream Apply 0.'5mg'$  (pea-sized amount)  just inside the vaginal introitus with a finger-tip on  Monday, Wednesday and Friday  nights. Patient not taking: Reported on 12/05/2022 06/24/18   Zara Council A, PA-C  ketoconazole (NIZORAL) 2 % cream Apply 1 Application topically daily as needed for irritation (for bleeding fingers). Patient not taking: Reported on 11/08/2022    [provider]  Multiple Vitamins-Minerals (HAIR/SKIN/NAILS PO) Take 1 tablet by mouth daily. Patient not taking:  Reported on 11/16/2022    [provider]  Protein (PROSOURCE PO) Take 1 Dose by mouth 2 (two) times daily. For wound healing    [provider]  silver sulfADIAZINE (SILVADENE) 1 % cream Apply 1 Application topically 2 (two) times daily. For wound care Patient not taking: Reported on 11/19/2022    [provider]  traMADol (ULTRAM) 50 MG tablet TAKE ONE TABLET TWICE DAILY AS NEEDED 06/27/22   Dewayne Shorter, MD  triamcinolone cream (KENALOG) 0.1 % Apply 1 Application topically every 12 (twelve) hours as needed (for rash). Patient not taking: Reported on 11/12/2022 06/28/20   [provider]    Physical Exam: Vitals:   11/16/2022 1615 11/14/2022 1630 12/01/2022 1645 12/02/2022 1700  BP: (!) 95/56 (!) 103/57    Pulse: (!) 101 (!) 113 (!) 102 (!) 114  Resp: (!) 21 20 (!) 27   Temp: 98.7 F (37.1 C) 98.7 F (37.1 C) 98.6 F (37 C) 98.7 F (37.1 C)  SpO2: 94% 93% 100% 97%   General: Not in acute distress HEENT:       Eyes: PERRL, EOMI, no scleral icterus.       ENT: No discharge from the ears and nose.       Neck: positive JVD, no bruit, no mass felt. Heme: No neck lymph node enlargement. Cardiac: S1/S2, RRR, No murmurs, No gallops or rubs. Respiratory:  has faint rhonchi with shallow respirations   GI: Soft, nondistended, nontender, no organomegaly, BS present. GU: No hematuria Ext: 3+ pitting leg edema bilaterally. 1+DP/PT pulse bilaterally. Musculoskeletal: No joint deformities, No joint redness or warmth, no limitation of ROM in spin. Skin: No rashes.  Neuro: Intermittently more confused, partially following command intermittently, cranial nerves II-XII grossly intact, moves all extremities  Psych: Patient is not psychotic, no suicidal or hemocidal ideation.  Labs on Admission: I have personally reviewed following labs and imaging studies  CBC: Recent Labs  Lab 12/01/2022 1251  WBC 16.9*  NEUTROABS 15.3*  HGB 8.5*  HCT 27.7*  MCV 102.2*  PLT [id number]    Basic Metabolic Panel: Recent Labs  Lab 11/10/2022 1251  NA 127*  K 6.3*  CL 99  CO2 17*  GLUCOSE 270*  BUN 85*  CREATININE 1.97*  CALCIUM 8.9   GFR: Estimated Creatinine Clearance: 21 mL/min (A) (by C-G formula based on SCr of 1.97 mg/dL (H)). Liver Function Tests: Recent Labs  Lab 11/24/2022 1251  AST 34  ALT 17  ALKPHOS 51  BILITOT 0.7  PROT 5.3*  ALBUMIN 2.7*   No results for input(s): "LIPASE", "AMYLASE" in the last 168 hours. No results for input(s): "AMMONIA" in the last 168 hours. Coagulation Profile: Recent Labs  Lab 11/25/2022 1251  INR 1.4*   Cardiac Enzymes: No results for input(s): "CKTOTAL", "CKMB", "CKMBINDEX", "TROPONINI" in the last 168 hours. BNP (last 3 results) No results for input(s): "PROBNP" in the last 8760 hours. HbA1C: No results for input(s): "HGBA1C" in the last 72 hours. CBG: No results for input(s): "GLUCAP" in the last 168 hours. Lipid Profile: No results for input(s): "CHOL", "HDL", "LDLCALC", "TRIG", "CHOLHDL", "LDLDIRECT" in the last 72 hours. Thyroid  Function Tests: No results for input(s): "TSH", "T4TOTAL", "FREET4", "T3FREE", "THYROIDAB" in the last 72 hours. Anemia Panel: No results for input(s): "VITAMINB12", "FOLATE", "FERRITIN", "TIBC", "IRON", "RETICCTPCT" in the last 72 hours. Urine analysis:    Component Value Date/Time   COLORURINE YELLOW (A) 12/02/2022 1319   APPEARANCEUR HAZY (A) 11/28/2022 1319   APPEARANCEUR Clear 06/24/2018 1205   LABSPEC 1.015 11/08/2022 1319   PHURINE 5.0 11/22/2022 1319   GLUCOSEU NEGATIVE 11/30/2022 1319   GLUCOSEU NEGATIVE 03/18/2018 1542   HGBUR NEGATIVE 11/19/2022 1319   BILIRUBINUR NEGATIVE 11/09/2022 1319   BILIRUBINUR Negative 06/24/2018 1205   KETONESUR NEGATIVE 11/15/2022 1319   PROTEINUR NEGATIVE 11/15/2022 1319   UROBILINOGEN 1.0 03/18/2018 1542   NITRITE NEGATIVE 11/08/2022 1319   LEUKOCYTESUR SMALL (A) 11/18/2022 1319   Sepsis  Labs: '@LABRCNTIP'$ (procalcitonin:4,lacticidven:4) ) Recent Results (from the past 240 hour(s))  Resp panel by RT-PCR (RSV, Flu A&B, Covid) Anterior Nasal Swab     Status: None   Collection Time: 11/10/2022 12:49 PM   Specimen: Anterior Nasal Swab  Result Value Ref Range Status   SARS Coronavirus 2 by RT PCR NEGATIVE NEGATIVE Final    Comment: (NOTE) SARS-CoV-2 target nucleic acids are NOT DETECTED.  The SARS-CoV-2 RNA is generally detectable in upper respiratory specimens during the acute phase of infection. The lowest concentration of SARS-CoV-2 viral copies this assay can detect is 138 copies/mL. A negative result does not preclude SARS-Cov-2 infection and should not be used as the sole basis for treatment or other patient management decisions. A negative result may occur with  improper specimen collection/handling, submission of specimen other than nasopharyngeal swab, presence of viral mutation(s) within the areas targeted by this assay, and inadequate number of viral copies(<138 copies/mL). A negative result must be combined with clinical observations, patient history, and epidemiological information. The expected result is Negative.  Fact Sheet for Patients:  EntrepreneurPulse.com.au  Fact Sheet for Healthcare Providers:  IncredibleEmployment.be  This test is no t yet approved or cleared by the [place] FDA and  has been authorized for detection and/or diagnosis of SARS-CoV-2 by FDA under an Emergency Use Authorization (EUA). This EUA will remain  in effect (meaning this test can be used) for the duration of the COVID-19 declaration under Section 564(b)(1) of the Act, 21 U.S.C.section 360bbb-3(b)(1), unless the authorization is terminated  or revoked sooner.       Influenza A by PCR NEGATIVE NEGATIVE Final   Influenza B by PCR NEGATIVE NEGATIVE Final    Comment: (NOTE) The Xpert Xpress SARS-CoV-2/FLU/RSV plus assay is intended as  an aid in the diagnosis of influenza from Nasopharyngeal swab specimens and should not be used as a sole basis for treatment. Nasal washings and aspirates are unacceptable for Xpert Xpress SARS-CoV-2/FLU/RSV testing.  Fact Sheet for Patients: EntrepreneurPulse.com.au  Fact Sheet for Healthcare Providers: IncredibleEmployment.be  This test is not yet approved or cleared by the [place] FDA and has been authorized for detection and/or diagnosis of SARS-CoV-2 by FDA under an Emergency Use Authorization (EUA). This EUA will remain in effect (meaning this test can be used) for the duration of the COVID-19 declaration under Section 564(b)(1) of the Act, 21 U.S.C. section 360bbb-3(b)(1), unless the authorization is terminated or revoked.     Resp Syncytial Virus by PCR NEGATIVE NEGATIVE Final    Comment: (NOTE) Fact Sheet for Patients: EntrepreneurPulse.com.au  Fact Sheet for Healthcare Providers: IncredibleEmployment.be  This test is not yet approved or cleared by the [place] FDA and has  been authorized for detection and/or diagnosis of SARS-CoV-2 by FDA under an Emergency Use Authorization (EUA). This EUA will remain in effect (meaning this test can be used) for the duration of the COVID-19 declaration under Section 564(b)(1) of the Act, 21 U.S.C. section 360bbb-3(b)(1), unless the authorization is terminated or revoked.  Performed at [facility], [address] [place], [place] [address]      Radiological Exams on Admission: DG Chest [facility] 1 View  Result Date: 11/15/2022 CLINICAL DATA:  Questionable sepsis, evaluate for abnormality EXAM: PORTABLE CHEST 1 VIEW COMPARISON:  None Available. FINDINGS: The heart is enlarged. Pulmonary vascular congestion without evidence of frank pulmonary edema sternotomy wires suggesting prior cardiac surgery. Elevation of the left hemidiaphragm. Thoracic  spondylosis and bilateral glenohumeral osteoarthritis. IMPRESSION: Cardiomegaly with pulmonary vascular congestion. No evidence of frank pulmonary edema. Elevation of the left hemidiaphragm, likely a chronic process. Electronically Signed   By: Keane Police D.O.   On: 11/13/2022 13:16      Assessment/Plan Principal Problem:   Comfort measures only status Active Problems:   Septic shock (HCC)   UTI (urinary tract infection)   Chronic diastolic heart failure (HCC)   Hyperkalemia   CAD (coronary artery disease)   Acute renal failure superimposed on stage 3b chronic kidney disease (HCC)   Acute metabolic encephalopathy   Hyponatremia   Asthma   Hyperlipidemia   HTN (hypertension)   Depression with anxiety   PVD (peripheral vascular disease) (HCC)   Macrocytic anemia   Assessment and Plan:  Comfort measures only status: Patient has multiple chronic complicated comorbidities, as listed below. Now presents with septic shock due to possible UTI.  Has persistent hypotension even with IV fluid resuscitation.  Patient also has worsening renal function, hyponatremia and hyperkalemia. His prognosis is extremely poor. EDP consulted Dr. Mortimer Fries of ICU. Per Dr. Mortimer Fries, "Patient with Progressive multiorgan failure with a very high probablity of a very minimal chance of meaningful recovery despite all aggressive and optimal medical therapy".  Dr. Mortimer Fries has had extensive discussion with the family. Patient's family members were very supportive. They agree for comfort care now, which is reasonable given his progressive decline in ED. Pt will be DNR.  -Will admit to med-surg bed for comfort care. -Stop drawing labs and IVF -prn LORazepam for agitation - prn fentanyl for pain -Naphazoline 0.1 % ophthalmic solution -scopolamine patch -Psycho/Social: emotional support offered to family at bedside -consult to palliterative care team -Nurse may pronounce death  Other medical issues are listed as below, will  focus on comfort care now:   Chronic diastolic heart failure (HCC)   Hyperkalemia   CAD (coronary artery disease)   Acute renal failure superimposed on stage 3b chronic kidney disease ([place])   Acute metabolic encephalopathy   Hyponatremia   Asthma   Hyperlipidemia   HTN (hypertension)   Depression with anxiety   PVD (peripheral vascular disease) (HCC)   Macrocytic anemia      DVT ppx: none  Code Status: DNR  Family Communication:  Yes, patient's daughter at bed side.    Disposition Plan:  Anticipate discharge back to previous environment  Consults called:  Dr. Mortimer Fries of ICU  Admission status and Level of care: Med-Surg:    as inpt      Dispo: The patient is from: SNF              Anticipated d/c is to:  to be determined  Anticipated d/c date is: 2 days              Patient currently is not medically stable to d/c.    Severity of Illness:  The appropriate patient status for this patient is INPATIENT. Inpatient status is judged to be reasonable and necessary in order to provide the required intensity of service to ensure the patient's safety. The patient's presenting symptoms, physical exam findings, and initial radiographic and laboratory data in the context of their chronic comorbidities is felt to place them at high risk for further clinical deterioration. Furthermore, it is not anticipated that the patient will be medically stable for discharge from the hospital within 2 midnights of admission.   * I certify that at the point of admission it is my clinical judgment that the patient will require inpatient hospital care spanning beyond 2 midnights from the point of admission due to high intensity of service, high risk for further deterioration and high frequency of surveillance required.*       Date of Service 11/23/2022    Ivor [facility]   If 7PM-7AM, please contact night-coverage [email or web address] 12/03/2022, 5:51 PM

## 2022-12-04 NOTE — Progress Notes (Signed)
Code sepsis dc'ed

## 2022-12-04 NOTE — ED Notes (Signed)
Dr. Mortimer Fries, Eulis Manly, and this RN at bedside discussing with pt, her daughter, and son Lennox Grumbles [facility], on the phone) POC, and end of life care. Pt's son, who is healthcare power of attorney agrees that no further intervention should be initiated. Also agrees that levophed should be turned off. This RN witnessed conversation and will follow through with orders and withdrawing care, and focusing on pain control and comfort for pt. And family.

## 2022-12-04 NOTE — ED Notes (Signed)
Assumed care of patient at this time. Patient in bed, restless at this time. Family requesting medications for comfort. Orders reviewed. Patient alert with eyes open, respirations even at this time.  Family questions answered, no other concerns at this time.

## 2022-12-04 NOTE — IPAL (Signed)
  Interdisciplinary Goals of Care Family Meeting   Date carried out: 11/10/2022  Location of the meeting: Bedside  Member's involved: Physician, Nurse Practitioner, Bedside Registered Nurse, and Family Member or next of kin  GOALS OF CARE DISCUSSION  The Clinical status was relayed to family in detail- Son Lennox Grumbles over the phone, Daughter at bedside  Updated and notified of patients medical condition- Patient with increased WOB and using accessory muscles to breathe Explained to family course of therapy and the modalities   Patient with Progressive multiorgan failure with a very high probablity of a very minimal chance of meaningful recovery despite all aggressive and optimal medical therapy.    PATIENT IS NOW DNR/DNI SHE WOULD NOT WANT ANY MEDICATIONS SHE DOES NOT WANT PRESSORS SHE DOES NOT WANT ANY PAIN MEDS  I HAVE DISCUSSED WITH SON AND DAUGHTER AND THEY HAD EXPRESSED THAT THE FOCUS NEEDS TO BE MADE COMFORT CARE   AT THIS TIME, WE WILL STOP PRESSORS AND ADD PAIN MEDS AS NEEDED AND PATIENT WILL DIE IN TIME  ER NURSE AND ICU APP,  DAUGHTER  AT BEDSIDE AND SON ON THE PHONE UNDERSTAND HER GRAVE PROGNOSIS  DISCUSSION RELAYED DR Ellender Hose  Family understands the situation.   Family are satisfied with Plan of action and management. All questions answered  Additional CC time 45 mins   Gurpreet Mikhail Patricia Pesa, M.D.  [facility]  Medical Director [place] Director [facility] Cardio-Pulmonary Department

## 2022-12-04 NOTE — ED Triage Notes (Signed)
Pt. To ED via EMS for low BP (88/48) and AMS at facility, [facility]. Facility states pt. Has been progressively more altered today, and BP low prior to Multimedia programmer. Pt. Denies CP, SOB.

## 2022-12-04 NOTE — ED Notes (Signed)
ED Provider at bedside. 

## 2022-12-04 NOTE — Code Documentation (Signed)
CODE SEPSIS - PHARMACY COMMUNICATION  **Broad Spectrum Antibiotics should be administered within 1 hour of Sepsis diagnosis**  Time Code Sepsis Called/Page Received: 1249  Antibiotics Ordered: cefepime, vancomycin  Time of 1st antibiotic administration: [place] ,PharmD Clinical Pharmacist  11/25/2022  1:01 PM

## 2022-12-04 NOTE — ED Notes (Addendum)
Extra IVs removed for pt. Comfort per pt. And family request. Pt's monitor turned to comfort care, cardiac monitoring, lead stickers removed for pt's comfort. Pt's family/visitors at bedside. Lights dimmed, warm blankets provided. Family/visitors verbalize understanding to call RN if pt. Appears to be in pain.

## 2022-12-04 NOTE — H&P (Signed)
NAME:  GIRLIE AUEN, MD, MRN:  [id number], DOB:  1936-08-25, LOS: 0 ADMISSION DATE:  11/14/2022, CONSULTATION DATE: 11/22/2022 REFERRING MD: Dr. Ellender Hose , CHIEF COMPLAINT: AMS    History of Present Illness:  This is an 87 yo female who presented to [facility] ER via EMS on 02/27 from [facility] with worsening altered mental status and hypotension.  Onset of symptoms 02/27.  ED Course Upon arrival to the ER pt remained hypotensive, therefore sepsis protocol initiated.  She received IV fluid resuscitation, cefepime, metronidazole, and vancomycin.  However, she remained hypotensive requiring peripheral levophed gtt.   Lab results significant for: Na+ 127/K+ 6.3/CO2 17/glucose 270/BUN 85/creatinine 1.97/albumin 2.7/lactic acid 4.4/wbc 16.9/hgb 8.5/PT 17.3/INR 1.4. Due to hyperkalemia pt received 5 units of iv insulin/1 amp if D50W/1g of calcium/10g of lokelma.  UA positive for small amount of leukocytes and many bacteria.   CXR concerning for pulmonary vascular congestion.  PCCM team contacted for ICU admission.  Pertinent  Medical History  Acute GI Bleed  Chronic Diastolic CHF Allergies Arthritis  Asthma Benign Essential HTN  Melanoma of Right Arm  CAD s/p CABG Paroxysmal Atrial Fibrillation on Eliquis   GI Bleed May 2021 while on Eliquis (EDG and colonoscopy positive for hemorrhoids) CKD  Colon Polyps  Depression  GERD Hiatal Hernia  HLD PSVT Spinal Stenosis  Raynaud's Phenomenon  Vitamin D Deficiency   Significant Hospital Events: Including procedures, antibiotic start and stop dates in addition to other pertinent events   02/27: Pt admitted with septic shock secondary to possible UTI, acute kidney injury on CKD with metabolic acidosis, and acute metabolic encephalopathy requiring levophed gtt   Interim History / Subjective:    Objective   Blood pressure (!) 85/56, pulse 94, temperature 98.8 F (37.1 C), resp. rate 20, SpO2 95 %.        Intake/Output Summary (Last 24  hours) at 12/03/2022 1339 Last data filed at 11/26/2022 1323 Gross per 24 hour  Intake 100 ml  Output --  Net 100 ml   There were no vitals filed for this visit.  Examination: General: Acute on chronically-ill appearing elderly female, mild respiratory distress  HENT: Supple, mild JVD present  Lungs: Faint rhonchi with shallow respirations  Cardiovascular: Sinus tachycardia, s1s2, no r/g, 2+ radial/1+ distal pulses, 3+ bilateral lower extremity pitting edema  Abdomen: +BS x4, obese, soft, non tender, non distended  Extremities: Normal tone, moves all extremities  Neuro: Confused, following commands, PERRLA GU: Deferred   Resolved Hospital Problem list     Assessment & Plan:  Acute hypoxic respiratory failure secondary to vascular congestion  - Supplemental O2 for dyspnea and/or hypoxia  - Maintain O2 sats >90%  Hypotension secondary to sepsis and hypovolemia  Hx: HTN, Paroxysmal atrial fibrillation, and Chronic diastolic CHF  - Continuous telemetry monitoring  - Very gentle iv fluid resuscitation and prn peripheral levophed gtt to maintain map >65 - Hold outpatient antidiuretic therapy for now due to hypotension  Acute kidney injury on stage 3a CKD with metabolic acidosis and hyperkalemia secondary to ATN Hyponatremia  - Trend BMP  - Replace electrolytes as indicated  - Monitor UOP - Avoid nephrotoxic medications   - Gentle iv fluid resuscitation  Sepsis secondary to possible UTI  - Trend WBC and monitor fever curve  - Trend PCT  - Follow cultures  - Continue broad spectrum abx therapy   Anemia without signs of obvious bleeding  Hx: GI bleed  - Trend CBC  - Will hold  outpatient eliquis for now given significant drop in hgb from baseline  - Monitor for s/sx of bleeding  - Transfuse for hgb <7  Acute metabolic encephalopathy - Correct metabolic derangements   Best Practice (right click and "Reselect all SmartList Selections" daily)   Diet/type: NPO DVT  prophylaxis: SCD GI prophylaxis: N/A Lines: N/A Foley:  N/A Code Status:  DNR Last date of multidisciplinary goals of care discussion [N/A]  Labs   CBC: Recent Labs  Lab 11/18/2022 1251  WBC 16.9*  NEUTROABS PENDING  HGB 8.5*  HCT 27.7*  MCV 102.2*  PLT [id number]    Basic Metabolic Panel: Recent Labs  Lab 11/16/2022 1251  NA 127*  K 6.3*  CL 99  CO2 17*  GLUCOSE 270*  BUN 85*  CREATININE 1.97*  CALCIUM 8.9   GFR: Estimated Creatinine Clearance: 21 mL/min (A) (by C-G formula based on SCr of 1.97 mg/dL (H)). Recent Labs  Lab 11/10/2022 1251  WBC 16.9*  LATICACIDVEN 4.4*    Liver Function Tests: Recent Labs  Lab 11/20/2022 1251  AST 34  ALT 17  ALKPHOS 51  BILITOT 0.7  PROT 5.3*  ALBUMIN 2.7*   No results for input(s): "LIPASE", "AMYLASE" in the last 168 hours. No results for input(s): "AMMONIA" in the last 168 hours.  ABG No results found for: "PHART", "PCO2ART", "PO2ART", "HCO3", "TCO2", "ACIDBASEDEF", "O2SAT"   Coagulation Profile: Recent Labs  Lab 11/19/2022 1251  INR 1.4*    Cardiac Enzymes: No results for input(s): "CKTOTAL", "CKMB", "CKMBINDEX", "TROPONINI" in the last 168 hours.  HbA1C: Hemoglobin A1C  Date/Time Value Ref Range Status  01/11/2022 12:00 AM 5.7  Final   Hgb A1c MFr Bld  Date/Time Value Ref Range Status  05/10/2022 03:32 PM 6.1 4.6 - 6.5 % Final    Comment:    Glycemic Control Guidelines for People with Diabetes:Non Diabetic:  <6%Goal of Therapy: <7%Additional Action Suggested:  >8%   06/22/2021 02:27 PM 6.3 4.6 - 6.5 % Final    Comment:    Glycemic Control Guidelines for People with Diabetes:Non Diabetic:  <6%Goal of Therapy: <7%Additional Action Suggested:  >8%     CBG: No results for input(s): "GLUCAP" in the last 168 hours.  Review of Systems: Positives in BOLD   Gen: back pain, fever, chills, weight change, fatigue, night sweats HEENT: Denies blurred vision, double vision, hearing loss, tinnitus, sinus congestion,  rhinorrhea, sore throat, neck stiffness, dysphagia PULM: Denies shortness of breath, cough, sputum production, hemoptysis, wheezing CV: Denies chest pain, edema, orthopnea, paroxysmal nocturnal dyspnea, palpitations GI: Denies abdominal pain, nausea, vomiting, diarrhea, hematochezia, melena, constipation, change in bowel habits GU: Denies dysuria, hematuria, polyuria, oliguria, urethral discharge Endocrine: Denies hot or cold intolerance, polyuria, polyphagia or appetite change Derm: Denies rash, dry skin, scaling or peeling skin change Heme: Denies easy bruising, bleeding, bleeding gums Neuro: headache, numbness, weakness, slurred speech, loss of memory or consciousness   Past Medical History:  She,  has a past medical history of Acute GI bleeding (03/16/2020), Acute on chronic diastolic CHF (congestive heart failure) ([place]) (10/08/2016), Allergy, Arthritis, Asthma, Benign essential hypertension (06/21/2013), CAD (coronary artery disease), Cancer ([employer]), Carotid arterial disease ([place]), Chronic kidney disease, Colon polyps, Depression, GERD (gastroesophageal reflux disease), Headache, Heart murmur, History of chicken pox, History of hiatal hernia, migraines, UTI (urinary tract infection), Hyperlipidemia, Hypertension, Hypertensive heart disease, Lichen planus, Melanoma ([place]), Palpitations, PMR (polymyalgia rheumatica) (HCC), PSVT (paroxysmal supraventricular tachycardia), PUD (peptic ulcer disease), Raynaud's phenomenon, Spinal stenosis, Urine incontinence, and Vitamin D deficiency.  Surgical History:   Past Surgical History:  Procedure Laterality Date   ADENOIDECTOMY     age 19   BACK SURGERY     L3-L5   BILATERAL CARPAL TUNNEL RELEASE     BREAST BIOPSY Right    bx x 3 neg   BREAST SURGERY Right    biopsy x 3 (all benign)   CARDIAC CATHETERIZATION N/A 06/01/2016   Procedure: LEFT HEART CATH AND CORS/GRAFTS ANGIOGRAPHY;  Surgeon: Wellington Hampshire, MD;  Location: [place] [facility];   Service: Cardiovascular;  Laterality: N/A;   CARDIAC CATHETERIZATION N/A 06/01/2016   Procedure: Coronary Stent Intervention;  Surgeon: Wellington Hampshire, MD;  Location: [place] [facility];  Service: Cardiovascular;  Laterality: N/A;   CARDIOVERSION N/A 03/08/2017   Procedure: CARDIOVERSION;  Surgeon: Wellington Hampshire, MD;  Location: [facility];  Service: Cardiovascular;  Laterality: N/A;   CATARACT EXTRACTION W/PHACO Right 01/04/2016   Procedure: CATARACT EXTRACTION PHACO AND INTRAOCULAR LENS PLACEMENT ([place]);  Surgeon: Leandrew Koyanagi, MD;  Location: [place];  Service: Ophthalmology;  Laterality: Right;  MALYUGIN   CATARACT EXTRACTION W/PHACO Left 01/25/2016   Procedure: CATARACT EXTRACTION PHACO AND INTRAOCULAR LENS PLACEMENT ([place]) left eye;  Surgeon: Leandrew Koyanagi, MD;  Location: [place];  Service: Ophthalmology;  Laterality: Left;  MALYUGIN SHUGARCAINE   CHOLECYSTECTOMY  90's   COLONOSCOPY  03/19/2020   Procedure: COLONOSCOPY;  Surgeon: Virgel Manifold, MD;  Location: [facility] END[facility];  Service: Gastroenterology;;   COLONOSCOPY WITH PROPOFOL N/A 05/03/2015   Procedure: COLONOSCOPY WITH PROPOFOL;  Surgeon: Lollie Sails, MD;  Location: [facility] ENDOSCOPY;  Service: Endoscopy;  Laterality: N/A;   COLONOSCOPY WITH PROPOFOL N/A 03/19/2020   Procedure: COLONOSCOPY WITH PROPOFOL;  Surgeon: Virgel Manifold, MD;  Location: [facility] ENDOSCOPY;  Service: Endoscopy;  Laterality: N/A;   CORONARY ARTERY BYPASS GRAFT  98   4 vessel   CORONARY ARTERY BYPASS GRAFT     x 4 age 66 per pt    CORONARY STENT INTERVENTION N/A 03/31/2018   Procedure: CORONARY STENT INTERVENTION;  Surgeon: Wellington Hampshire, MD;  Location: [place] [facility];  Service: Cardiovascular;  Laterality: N/A;   ESOPHAGOGASTRODUODENOSCOPY N/A 03/22/2015   Procedure: ESOPHAGOGASTRODUODENOSCOPY (EGD);  Surgeon: Lollie Sails, MD;  Location: [facility];  Service: Endoscopy;  Laterality: N/A;    ESOPHAGOGASTRODUODENOSCOPY N/A 03/19/2020   Procedure: ESOPHAGOGASTRODUODENOSCOPY (EGD);  Surgeon: Virgel Manifold, MD;  Location: [facility] ENDOSCOPY;  Service: Endoscopy;  Laterality: N/A;   HEMORRHOID BANDING     HEMORRHOID SURGERY N/A 02/17/2018   Procedure: HEMORRHOIDECTOMY;  Surgeon: Robert Bellow, MD;  Location: [facility] OR[facility];  Service: General;  Laterality: N/A;   JOINT REPLACEMENT     BILATERAL KNEE REPLACEMENTS   KNEE ARTHROSCOPY W/ OATS PROCEDURE     Lt knee (9/01), Rt knee (3/11), Lt hip (5/10)   LEFT HEART CATH AND CORONARY ANGIOGRAPHY N/A 03/31/2018   Procedure: LEFT HEART CATH AND CORONARY ANGIOGRAPHY;  Surgeon: Wellington Hampshire, MD;  Location: [place] [facility];  Service: Cardiovascular;  Laterality: N/A;   TOTAL HIP ARTHROPLASTY       Social History:   reports that she has quit smoking. Her smoking use included cigarettes. She has never used smokeless tobacco. She reports that she does not drink alcohol and does not use drugs.   Family History:  Her family history includes Alcohol abuse in her father; Depression in her daughter, father, maternal grandmother, and son; Heart disease in her father; Hypertension in her  maternal grandfather and paternal grandfather; Lung cancer in her sister; Seizures in her son; Stroke in her maternal grandmother; Thyroid disease in her daughter and mother. There is no history of Breast cancer.   Allergies Allergies  Allergen Reactions   Beta Adrenergic Blockers Shortness Of Breath    DEPRESSION/HYPOTENSION   Brilinta [Ticagrelor] Shortness Of Breath    Caused AFIB   Albuterol     rigors   Antihistamines, Diphenhydramine-Type     Muscle spasms   Aspirin Other (See Comments)    Heartburn    Nsaids Other (See Comments)    ULCER HISTORY & CURRENTLY ON BLOOD THINNERS   Penicillins Hives and Other (See Comments)    Has patient had a PCN reaction causing immediate rash, facial/tongue/throat swelling, SOB or lightheadedness with  hypotension: No Has patient had a PCN reaction causing severe rash involving mucus membranes or skin necrosis: No Has patient had a PCN reaction that required hospitalization No Has patient had a PCN reaction occurring within the last 10 years: No If all of the above answers are "NO", then may proceed with Cephalosporin use.    Statins Other (See Comments)    Muscle weakness   Sulfasalazine Rash    At mouth   Tetracycline Rash   Tetracyclines & Related Rash     Home Medications  Prior to Admission medications   Medication Sig Start Date End Date Taking? Authorizing Provider  acetaminophen (TYLENOL) 325 MG tablet Take 650 mg by mouth every 4 (four) hours as needed for mild pain or moderate pain.    [provider]  acetaminophen (TYLENOL) 500 MG tablet Take 1,000 mg by mouth 2 (two) times daily.    [provider]  aluminum-magnesium hydroxide-simethicone (MAALOX) [id number] MG/5ML SUSP 30 mLs.  Give 2 Tbsp by mouth every 4 hours as needed for gas, indigestion, or upset stomach supervised self-administration Notify MD if no relief    [provider]  amitriptyline (ELAVIL) 25 MG tablet Take 1 tablet (25 mg total) by mouth at bedtime. 09/20/22   [name], MD  apixaban (ELIQUIS) 2.5 MG TABS tablet Take 1 tablet (2.5 mg total) by mouth 2 (two) times daily. 08/08/22   Dewayne Shorter, MD  ATROVENT HFA 17 MCG/ACT inhaler INHALE 2 PUFFS INTO THE LUNGS EVERY 6 HOURS AS NEEDED FOR WHEEZING 10/11/21   [name], MD  b complex vitamins tablet Take 1 tablet by mouth daily.    [provider]  Biotin w/ Vitamins C & E 1250-7.5-7.5 MCG-MG-UNT CHEW One tablet by mouth daily.    [provider]  bismuth subsalicylate (QC PINK BISMUTH) 262 MG/15ML suspension Take 10 mLs by mouth as directed. Every 24 hours as needed for loose stools    [provider]  CALCIUM ALGINATE EX Apply to buttocks topicaly every other day for wound.    [provider]  Cholecalciferol (VITAMIN D3) 2000 UNITS TABS Take 1,000 Units by mouth 2 (two) times a week. Tuesday and Saturdays.    [provider]  conjugated estrogens (PREMARIN) vaginal cream Apply 0.'5mg'$  (pea-sized amount)  just inside the vaginal introitus with a finger-tip on  Monday, Wednesday and Friday nights. 06/24/18   McGowan, Hunt Oris, PA-C  dextromethorphan-guaiFENesin (ROBITUSSIN-DM) 10-100 MG/5ML liquid Take 10 mLs by mouth every 4 (four) hours as needed for cough.    [provider]  emollient (BIAFINE) cream Apply to affected areas topically every 12 hours as needed.    [provider]  Evolocumab (REPATHA  SURECLICK) [id number] MG/ML SOAJ INJECT 1 PEN EVERY 14 DAYS AS DIRECTED  MUST SCHEDULE OFFICE VISIT FOR FURTHER REFILLS  09/03/22   Minna Merritts, MD  ezetimibe (ZETIA) 10 MG tablet TAKE 1 TABLET BY MOUTH DAILY 04/25/22   Minna Merritts, MD  furosemide (LASIX) 40 MG tablet Take 40 mg by mouth daily.    [provider]  hydrocortisone 2.5 % cream APPLY TO LICHEN PLANUS AS DIRECTED 06/15/22   [name], MD  hydrocortisone cream (PREPARATION H) 1 % Apply 1 Application topically every 6 (six) hours as needed for itching. Apply to rectal area    [provider]  Ewa Gentry [facility] EX) Apply to buttock topically twice daily as needed.    [provider]  ketoconazole (NIZORAL) 2 % cream Apply 1 Application topically daily as needed for irritation (for bleeding fingers).    [provider]  loratadine (CLARITIN) 10 MG tablet Take 10 mg by mouth daily.    [provider]  magnesium hydroxide (MILK OF MAGNESIA) 400 MG/5ML suspension Take 10 mLs by mouth daily as needed for mild constipation.    [provider]  miconazole (MICOTIN) 2 % powder Apply 1 Application topically as directed. Apply to vagina every 24 hours as need for vaginal itching    [provider]  miconazole  (MONISTAT 7) 2 % vaginal cream Place 1 Applicatorful vaginally as directed. Every 24 hours as needed for lichen planus    [provider]  Multiple Vitamins-Minerals (HAIR/SKIN/NAILS PO) Take 1 tablet by mouth daily.    [provider]  nystatin powder Apply 1 Application topically every 8 (eight) hours as needed.    [provider]  ondansetron (ZOFRAN) 4 MG tablet Take 4 mg by mouth every 8 (eight) hours as needed for nausea or vomiting.    [provider]  pantoprazole (PROTONIX) 40 MG tablet Take 1 tablet (40 mg total) by mouth 2 (two) times daily. 09/20/22   [name], MD  polyethylene glycol (MIRALAX / GLYCOLAX) 17 g packet Take 17 g by mouth daily. In the evening for bowel regimen    [provider]  Protein (PROSOURCE PO) Take 1 Dose by mouth 2 (two) times daily. For wound healing    [provider]  senna (SENOKOT) 8.6 MG tablet Take 2 tablets by mouth daily.    [provider]  silver sulfADIAZINE (SILVADENE) 1 % cream Apply 1 Application topically 2 (two) times daily. For wound care    [provider]  traMADol (ULTRAM) 50 MG tablet TAKE ONE TABLET TWICE DAILY AS NEEDED 06/27/22   Dewayne Shorter, MD  traZODone (DESYREL) 150 MG tablet Two tablets by mouth at bedtime as needed for sleep 09/20/22   [name], MD  triamcinolone cream (KENALOG) 0.1 % Apply 1 Application topically every 12 (twelve) hours as needed (for rash). 06/28/20   [provider]  verapamil (CALAN-SR) 180 MG CR tablet TAKE ONE (1) TABLET BY MOUTH TWO TIMES PER DAY 09/20/22   [name], MD  vitamin B-12 (CYANOCOBALAMIN) 1000 MCG tablet Take 1,000 mcg by mouth daily.    [provider]  vitamin C (ASCORBIC ACID) 500 MG tablet Take 500 mg by mouth at bedtime.    [provider]     Critical care time: 80 minutes      Donell Beers, [place] Pager [phone] (please enter 7  digits) PCCM Consult Pager [phone] (please enter 7 digits)

## 2022-12-04 NOTE — ED Notes (Signed)
Dr. Niu at bedside 

## 2022-12-04 NOTE — Consult Note (Signed)
PHARMACY -  BRIEF ANTIBIOTIC NOTE   Pharmacy has received consult(s) for vancomycin and cefepime from an ED provider.  The patient's profile has been reviewed for ht/wt/allergies/indication/available labs.    One time order(s) placed for cefepime 2g IV x1 and vancomycin '1500mg'$  IV x1  Further antibiotics/pharmacy consults should be ordered by admitting physician if indicated.                       Thank you, Darrick Penna 12/05/2022  1:03 PM

## 2022-12-05 ENCOUNTER — Encounter: Payer: Self-pay | Admitting: Internal Medicine

## 2022-12-05 ENCOUNTER — Other Ambulatory Visit: Payer: Self-pay

## 2022-12-05 DIAGNOSIS — Z515 Encounter for palliative care: Secondary | ICD-10-CM | POA: Diagnosis not present

## 2022-12-05 DIAGNOSIS — R6521 Severe sepsis with septic shock: Secondary | ICD-10-CM | POA: Diagnosis not present

## 2022-12-05 DIAGNOSIS — Z7189 Other specified counseling: Secondary | ICD-10-CM | POA: Diagnosis not present

## 2022-12-05 DIAGNOSIS — A419 Sepsis, unspecified organism: Secondary | ICD-10-CM | POA: Diagnosis not present

## 2022-12-05 LAB — BLOOD CULTURE ID PANEL (REFLEXED) - BCID2

## 2022-12-05 MED ORDER — HYDROMORPHONE BOLUS VIA INFUSION
1.0000 mg | INTRAVENOUS | Status: DC | PRN
  Administered 2022-12-05 – 2022-12-06 (×9): 1 mg via INTRAVENOUS

## 2022-12-05 MED ORDER — HYDROMORPHONE HCL-NACL 50-0.9 MG/50ML-% IV SOLN
1.0000 mg/h | INTRAVENOUS | Status: DC
  Administered 2022-12-05 – 2022-12-06 (×2): 1 mg/h via INTRAVENOUS
  Administered 2022-12-06 – 2022-12-07 (×2): 2 mg/h via INTRAVENOUS
  Filled 2022-12-05 (×3): qty 50

## 2022-12-05 NOTE — Progress Notes (Signed)
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   Per her daughter at the bedside, pt has had increasing confusion over the past several days. Patient was found to have hypotension. Patient has been confused in ED, but intermittently becomes a little more clear. When I saw pt in ED, she seems to know her own name, and knows that she is in the hospital at one time, but then becoming confused again. She is not orientated to time.  She moves all extremities.  She has dry cough and SOB, no nausea, vomiting, diarrhea noted.  Not sure if patient has symptoms of UTI.  Does not seem to have chest pain or abdominal pain.   Her blood pressure is down to 62/22 in ED. After giving 2 L normal saline bolus, her blood pressure is 82/50 in ED. Dr. Mortimer Fries of ICU was consulted. He had extensive discussion with the family, decision was made for comfort care.   Assessment & Plan:   Principal Problem:   Comfort measures only status Active Problems:   Septic shock (HCC)   UTI (urinary tract infection)   Chronic diastolic heart failure (HCC)   Hyperkalemia   CAD (coronary artery disease)   Acute renal failure superimposed on stage 3b chronic kidney disease (HCC)   Acute metabolic encephalopathy   Hyponatremia   Asthma   Hyperlipidemia   HTN (hypertension)   Depression with anxiety   PVD (peripheral vascular disease) (HCC)   Macrocytic anemia   # Encephalopathy # Hypotension # Bacteremia # SIRS # Hyperkalemia # Hyponatremia # AKI # Lactic acidosis # Anemia One blood culture growing GPCs.  Briefly received pressors and abx. Patient and family have elected comfort care. Pain and agitation overnight resolved with dilaudid GTT, patient now sleeping comfortably. Son and Daughter are at bedside, son is hcpoa.  - continue hydromorphone gtt - anticipating in-hospital death - will reach out to palliative team about other d/c options   Code Status: DNR/DNI Family Communication: son and daughter updated @ bedside  Level of care: Med-Surg Status is: Inpatient Remains inpatient appropriate because: unsafe d/c plan    Consultants:  palliative  Procedures: none  Antimicrobials:  S/p vanc/cefepime/flagyl    Subjective: asleep  Objective: Vitals:   11/30/2022 1630 11/16/2022 1645 11/08/2022 1700 12/03/2022 2019  BP: (!) 103/57   (!) 112/99  Pulse: (!) 113 (!) 102 (!) 114 (!) 102  Resp: 20 (!) 27  17  Temp: 98.7 F (37.1 C) 98.6 F (37 C) 98.7 F (37.1 C) 97.7 F (36.5 C)  SpO2: 93% 100% 97% (!) 82%   No intake or output data in the 24 hours ending 12/05/22 1520 There were no vitals filed for this visit.  Examination:  General exam: Appears calm and comfortable, chronically ill appearing Respiratory system: no tachypnea Cardiovascular system: rr Central nervous system: asleep Skin: No visible rashes, lesions or ulcers Psychiatry: asleep    Data Reviewed: I have personally reviewed following labs and imaging studies  CBC: Recent Labs  Lab 11/28/2022 1251  WBC 16.9*  NEUTROABS 15.3*  HGB 8.5*  HCT 27.7*  MCV  102.2*  PLT [id number]   Basic Metabolic Panel: Recent Labs  Lab 12/03/2022 1251  NA 127*  K 6.3*  CL 99  CO2 17*  GLUCOSE 270*  BUN 85*  CREATININE 1.97*  CALCIUM 8.9   GFR: Estimated Creatinine Clearance: 21 mL/min (A) (by C-G formula based on SCr of 1.97 mg/dL (H)). Liver Function Tests: Recent Labs  Lab 11/21/2022 1251  AST 34  ALT 17  ALKPHOS 51  BILITOT 0.7  PROT 5.3*  ALBUMIN 2.7*   No results for input(s): "LIPASE", "AMYLASE" in the  last 168 hours. No results for input(s): "AMMONIA" in the last 168 hours. Coagulation Profile: Recent Labs  Lab 11/11/2022 1251  INR 1.4*   Cardiac Enzymes: No results for input(s): "CKTOTAL", "CKMB", "CKMBINDEX", "TROPONINI" in the last 168 hours. BNP (last 3 results) No results for input(s): "PROBNP" in the last 8760 hours. HbA1C: No results for input(s): "HGBA1C" in the last 72 hours. CBG: No results for input(s): "GLUCAP" in the last 168 hours. Lipid Profile: No results for input(s): "CHOL", "HDL", "LDLCALC", "TRIG", "CHOLHDL", "LDLDIRECT" in the last 72 hours. Thyroid Function Tests: No results for input(s): "TSH", "T4TOTAL", "FREET4", "T3FREE", "THYROIDAB" in the last 72 hours. Anemia Panel: No results for input(s): "VITAMINB12", "FOLATE", "FERRITIN", "TIBC", "IRON", "RETICCTPCT" in the last 72 hours. Urine analysis:    Component Value Date/Time   COLORURINE YELLOW (A) 11/16/2022 1319   APPEARANCEUR HAZY (A) 11/23/2022 1319   APPEARANCEUR Clear 06/24/2018 1205   LABSPEC 1.015 11/16/2022 1319   PHURINE 5.0 11/15/2022 1319   GLUCOSEU NEGATIVE 11/26/2022 1319   GLUCOSEU NEGATIVE 03/18/2018 1542   HGBUR NEGATIVE 11/27/2022 1319   BILIRUBINUR NEGATIVE 11/10/2022 1319   BILIRUBINUR Negative 06/24/2018 1205   KETONESUR NEGATIVE 11/10/2022 1319   PROTEINUR NEGATIVE 11/14/2022 1319   UROBILINOGEN 1.0 03/18/2018 1542   NITRITE NEGATIVE 11/29/2022 1319   LEUKOCYTESUR SMALL (A) 12/01/2022 1319   Sepsis Labs: '@LABRCNTIP'$ (procalcitonin:4,lacticidven:4)  ) Recent Results (from the past 240 hour(s))  Resp panel by RT-PCR (RSV, Flu A&B, Covid) Anterior Nasal Swab     Status: None   Collection Time: 11/12/2022 12:49 PM   Specimen: Anterior Nasal Swab  Result Value Ref Range Status   SARS Coronavirus 2 by RT PCR NEGATIVE NEGATIVE Final    Comment: (NOTE) SARS-CoV-2 target nucleic acids are NOT DETECTED.  The SARS-CoV-2 RNA is generally detectable in upper respiratory specimens  during the acute phase of infection. The lowest concentration of SARS-CoV-2 viral copies this assay can detect is 138 copies/mL. A negative result does not preclude SARS-Cov-2 infection and should not be used as the sole basis for treatment or other patient management decisions. A negative result may occur with  improper specimen collection/handling, submission of specimen other than nasopharyngeal swab, presence of viral mutation(s) within the areas targeted by this assay, and inadequate number of viral copies(<138 copies/mL). A negative result must be combined with clinical observations, patient history, and epidemiological information. The expected result is Negative.  Fact Sheet for Patients:  EntrepreneurPulse.com.au  Fact Sheet for Healthcare Providers:  IncredibleEmployment.be  This test is no t yet approved or cleared by the [place] FDA and  has been authorized for detection and/or diagnosis of SARS-CoV-2 by FDA under an Emergency Use Authorization (EUA). This EUA will remain  in effect (meaning this test can be used) for the duration of the COVID-19 declaration under Section 564(b)(1) of the Act, 21 U.S.C.section 360bbb-3(b)(1), unless the authorization is terminated  or revoked sooner.  Influenza A by PCR NEGATIVE NEGATIVE Final   Influenza B by PCR NEGATIVE NEGATIVE Final    Comment: (NOTE) The Xpert Xpress SARS-CoV-2/FLU/RSV plus assay is intended as an aid in the diagnosis of influenza from Nasopharyngeal swab specimens and should not be used as a sole basis for treatment. Nasal washings and aspirates are unacceptable for Xpert Xpress SARS-CoV-2/FLU/RSV testing.  Fact Sheet for Patients: EntrepreneurPulse.com.au  Fact Sheet for Healthcare Providers: IncredibleEmployment.be  This test is not yet approved or cleared by the [place] FDA and has been authorized for detection  and/or diagnosis of SARS-CoV-2 by FDA under an Emergency Use Authorization (EUA). This EUA will remain in effect (meaning this test can be used) for the duration of the COVID-19 declaration under Section 564(b)(1) of the Act, 21 U.S.C. section 360bbb-3(b)(1), unless the authorization is terminated or revoked.     Resp Syncytial Virus by PCR NEGATIVE NEGATIVE Final    Comment: (NOTE) Fact Sheet for Patients: EntrepreneurPulse.com.au  Fact Sheet for Healthcare Providers: IncredibleEmployment.be  This test is not yet approved or cleared by the [place] FDA and has been authorized for detection and/or diagnosis of SARS-CoV-2 by FDA under an Emergency Use Authorization (EUA). This EUA will remain in effect (meaning this test can be used) for the duration of the COVID-19 declaration under Section 564(b)(1) of the Act, 21 U.S.C. section 360bbb-3(b)(1), unless the authorization is terminated or revoked.  Performed at [facility], [address] [place], [place] [address]   Blood Culture (routine x 2)     Status: None (Preliminary result)   Collection Time: 11/17/2022 12:51 PM   Specimen: BLOOD  Result Value Ref Range Status   Specimen Description BLOOD BLOOD RIGHT FOREARM  Final   Special Requests   Final    BOTTLES DRAWN AEROBIC AND ANAEROBIC Blood Culture adequate volume   Culture  Setup Time   Final    GRAM POSITIVE COCCI AEROBIC BOTTLE ONLY Organism ID to follow Performed at [facility], [address] [place], [place] [address]    Culture GRAM POSITIVE COCCI  Final   Report Status PENDING  Incomplete  Blood Culture (routine x 2)     Status: None (Preliminary result)   Collection Time: 12/03/2022 12:51 PM   Specimen: BLOOD  Result Value Ref Range Status   Specimen Description BLOOD BLOOD RIGHT ARM  Final   Special Requests   Final    BOTTLES DRAWN AEROBIC AND ANAEROBIC Blood Culture adequate volume   Culture    Final    NO GROWTH < 24 HOURS Performed at [facility], [address] [place], [place] [address]    Report Status PENDING  Incomplete         Radiology Studies: DG Chest Port 1 View  Result Date: 11/30/2022 CLINICAL DATA:  Questionable sepsis, evaluate for abnormality EXAM: PORTABLE CHEST 1 VIEW COMPARISON:  None Available. FINDINGS: The heart is enlarged. Pulmonary vascular congestion without evidence of frank pulmonary edema sternotomy wires suggesting prior cardiac surgery. Elevation of the left hemidiaphragm. Thoracic spondylosis and bilateral glenohumeral osteoarthritis. IMPRESSION: Cardiomegaly with pulmonary vascular congestion. No evidence of frank pulmonary edema. Elevation of the left hemidiaphragm, likely a chronic process. Electronically Signed   By: Keane Police D.O.   On: 12/02/2022 13:16        Scheduled Meds:  scopolamine  1 patch Transdermal Q72H   Continuous Infusions:  HYDROmorphone 1 mg/hr (12/05/22 1048)     LOS: 1 day  Desma Maxim, MD [facility]   If 7PM-7AM, please contact night-coverage [email or web address] Password [facility] 12/05/2022, 3:20 PM

## 2022-12-05 NOTE — Progress Notes (Signed)
Nutrition Brief Note  Chart reviewed. Pt now transitioning to comfort care.  No further nutrition interventions planned at this time.  Please re-consult as needed.   Virjean Boman W, RD, LDN, CDCES Registered Dietitian II Certified Diabetes Care and Education Specialist Please refer to AMION for RD and/or RD on-call/weekend/after hours pager   

## 2022-12-05 NOTE — Progress Notes (Signed)
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  Assessment: Gram stain growing 1/4 GPCs in aerobic bottle. BCID detected staph species. Scope of care updated to full comfort measures.  Name of physician (or Provider) Contacted: Dr. Si Raider  Current antibiotics: none  Changes to prescribed antibiotics recommended:  No antibiotics given updated scope of care   No results found for this or any previous visit.   Glean Salvo, PharmD, BCPS Clinical Pharmacist  12/05/2022 4:45 PM

## 2022-12-05 NOTE — Progress Notes (Signed)
Patient resting peacefully in bed, no indications for bolus at this time. Dilaudid gtt infusing. Family at bedside.

## 2022-12-05 NOTE — Consult Note (Signed)
Consultation Note Date: 12/05/2022   Patient Name: RAEGANN MAIORANA, MD  DOB: 1935/12/02  MRN: [id number]  Age / Sex: 87 y.o., female  PCP: Dewayne Shorter, MD Referring Physician: Gwynne Edinger, MD  Reason for Consultation: Establishing goals of care   HPI/Brief Hospital Course: 87 y.o. female  with past medical history of chronic diastolic CHF, CAD s/p CABG, PAF on Eliquis, asthma, HTN, CKD, spinal stenosis, and HLD admitted from [facility] facility on 11/24/2022 with worsening AMS and hypotension. In ED found to have UTI and met sepsis criteria, ongoing hypotension even with fluid resuscitation, briefly started on Levophed.  iPAL discussion with family and Dr. Eveline Keto to comfort care per patient and family wishes (2/27)  Palliative medicine was consulted for assisting with ongoing goals of care discussions and management of comfort care orders.  Subjective:  Extensive chart review has been completed prior to meeting patient including labs, vital signs, imaging, progress notes, orders, and available advanced directive documents from current and previous encounters.  Introduced myself as a Designer, jewellery as a member of the palliative care team. Explained palliative medicine is specialized medical care for people living with serious illness. It focuses on providing relief from the symptoms and stress of a serious illness. The goal is to improve quality of life for both the patient and the family.   Visited with Ms. Laurence at her bedside. Resting comfortably. Several family members present at bedside including daughter-Elizabeth and son-Miles, daughter-in-law and granddaughter.  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 Family shares Ms. Weiman has expressed in the recent months her wishes to die naturally and her acceptance of this. They share transitioning to comfort care were her wishes and they honor that decision.  Family shares  that Ms. Downing has donated her body post-mortem to [facility], directed family to contact [facility] for further questions regarding accepting body.  Various questions and concerns were addressed regarding end of life. Educated on possible signs such as shallow or apneic breathing patterns and/or mottling that indicate transition to next stages. Family appreciative of education. They wish to allow Ms. Hord to pass her in the hospital-not interested in transferring to Hospice services at this time.  Upon review of MAR and discussions with nurse transitioned from Fentanyl IV pushes to Hydromorphone IV continuous infusion. Family agrees with plan.  Family encouraged to contact PMT for any further questions or concerns.  All questions/concerns addressed. Emotional support provided to patient/family/support persons. PMT will continue to follow and support patient as needed.  Objective: Primary Diagnoses: Present on Admission:  Asthma  Hyperlipidemia  Chronic diastolic heart failure (HCC)  HTN (hypertension)  Depression with anxiety  PVD (peripheral vascular disease) (HCC)  CAD (coronary artery disease)  Macrocytic anemia  Acute renal failure superimposed on stage 3b chronic kidney disease (HCC)  Septic shock (HCC)  UTI (urinary tract infection)  Acute metabolic encephalopathy  Hyponatremia  Hyperkalemia   Physical Exam Constitutional:      General: She is not in acute distress.    Appearance: She is ill-appearing.  Pulmonary:     Effort: No respiratory distress.  Skin:    General: Skin is warm and dry.     Vital Signs: BP (!) 112/99 (BP Location: Left Arm)   Pulse (!) 102   Temp 97.7 F (36.5 C)   Resp 17   SpO2 (!) 82%  Pain Scale: 0-10   Pain Score: 6    Palliative Assessment/Data: 20%   Assessment and  Plan  SUMMARY OF RECOMMENDATIONS   DNR Comfort Measures D/c Fentanyl IV pushes Started Hydromorphone continuous IV infusion 1-4 mg/hr with bolus  administration from bag to maintain comfort Continue utilization of IV lorazepam to maintain comfort Family desires hospital death PMT to continue to follow for ongoing needs and support  Discussed With: Nursing staff and primary team   Thank you for this consult and allowing Palliative Medicine to participate in the care of Ysabelle Wedding. Gully. Palliative medicine will continue to follow and assist as needed.   Time Total: 75 minutes  Time spent includes: Detailed review of medical records (labs, vital signs), medically appropriate exam (mental status, respiratory, cardiac, skin), discussed with treatment team, counseling and educating patient, family and staff, documenting clinical information, medication management and coordination of care.   Signed by: Theodoro Grist, DNP, AGNP-C Palliative Medicine    Please contact Palliative Medicine Team phone at [phone] for questions and concerns.  For individual provider: See Shea Evans

## 2022-12-05 NOTE — Progress Notes (Signed)
Patient resting in bed, peacefully. No signs of distress noted. Multiple family members at bedside.

## 2022-12-05 NOTE — Progress Notes (Signed)
Patient presents with symptoms of pain when repositioned in bed. Pre-medicated with IV dilaudid bolus, for comfort.

## 2022-12-06 DIAGNOSIS — G9340 Encephalopathy, unspecified: Secondary | ICD-10-CM | POA: Diagnosis not present

## 2022-12-06 DIAGNOSIS — Z7189 Other specified counseling: Secondary | ICD-10-CM | POA: Diagnosis not present

## 2022-12-06 DIAGNOSIS — Z515 Encounter for palliative care: Secondary | ICD-10-CM | POA: Diagnosis not present

## 2022-12-06 MED ORDER — GLYCOPYRROLATE 0.2 MG/ML IJ SOLN
0.2000 mg | INTRAMUSCULAR | Status: DC | PRN
  Administered 2022-12-06 – 2022-12-07 (×4): 0.2 mg via INTRAVENOUS
  Filled 2022-12-06 (×4): qty 1

## 2022-12-06 MED ORDER — GLYCOPYRROLATE 0.2 MG/ML IJ SOLN
0.2000 mg | Freq: Four times a day (QID) | INTRAMUSCULAR | Status: DC | PRN
  Administered 2022-12-06: 0.2 mg via INTRAVENOUS
  Filled 2022-12-06: qty 1

## 2022-12-06 NOTE — Progress Notes (Signed)
Patient noted to be having some gurgling-type secretions. MD notified for request for robinul.

## 2022-12-06 NOTE — Progress Notes (Signed)
Patient resting peacefully in bed. No signs of distress noted. Dilaudid gtt infusing and family at bedside.

## 2022-12-06 NOTE — Progress Notes (Signed)
Dilaudid gtt increased to 1.'5mg'$ /hr per titration order for symptom management: both labored respirations and apparent pain, after receiving boluses.Family agreeable to increase

## 2022-12-06 NOTE — Progress Notes (Signed)
More family members at bedside, updated on status and continued plan of care. Patient resting peacefully. Robinul seems to helped to alleviate some of the secretions. Family support provided and encouraged to call with any issues or concerns.

## 2022-12-06 NOTE — Progress Notes (Signed)
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   Per her daughter at the bedside, pt has had increasing confusion over the past several days. Patient was found to have hypotension. Patient has been confused in ED, but intermittently becomes a little more clear. When I saw pt in ED, she seems to know her own name, and knows that she is in the hospital at one time, but then becoming confused again. She is not orientated to time.  She moves all extremities.  She has dry cough and SOB, no nausea, vomiting, diarrhea noted.  Not sure if patient has symptoms of UTI.  Does not seem to have chest pain or abdominal pain.   Her blood pressure is down to 62/22 in ED. After giving 2 L normal saline bolus, her blood pressure is 82/50 in ED. Dr. Mortimer Fries of ICU was consulted. He had extensive discussion with the family, decision was made for comfort care.   Assessment & Plan:   Principal Problem:   Comfort measures only status Active Problems:   Septic shock (HCC)   UTI (urinary tract infection)   Chronic diastolic heart failure (HCC)   Hyperkalemia   CAD (coronary artery disease)   Acute renal failure superimposed on stage 3b chronic kidney disease (HCC)   Acute metabolic encephalopathy   Hyponatremia   Asthma   Hyperlipidemia   HTN (hypertension)   Depression with anxiety   PVD (peripheral vascular disease) (HCC)   Macrocytic anemia   # Encephalopathy # Hypotension # Bacteremia # SIRS # Hyperkalemia # Hyponatremia # AKI # Lactic acidosis # Anemia One blood culture growing GPCs.  Briefly received pressors and abx. Patient and family have elected comfort care. Pain and agitation overnight resolved with dilaudid GTT, patient now sleeping comfortably. Son and Daughter are at bedside, son is hcpoa.  - continue hydromorphone gtt - robinul added for secretions - hospice liaison consulted, family wants one more day of hospital monitoring but if patient remains stable tomorrow possible d/c to prior snf with home hospice   Code Status: DNR/DNI Family Communication: son and daughter updated @ bedside  Level of care: Med-Surg Status is: Inpatient Remains inpatient appropriate because: unsafe d/c plan    Consultants:  palliative  Procedures: none  Antimicrobials:  S/p vanc/cefepime/flagyl    Subjective: asleep  Objective: Vitals:   11/11/2022 1630 11/18/2022 1645 11/15/2022 1700 12/05/2022 2019  BP: (!) 103/57   (!) 112/99  Pulse: (!) 113 (!) 102 (!) 114 (!) 102  Resp: 20 (!) 27  17  Temp: 98.7 F (37.1 C) 98.6 F (37 C) 98.7 F (37.1 C) 97.7 F (36.5 C)  SpO2: 93% 100% 97% (!) 82%    Intake/Output Summary (Last 24 hours) at 12/06/2022 1425 Last data filed at 12/06/2022 0710 Gross per 24 hour  Intake 18.13 ml  Output 30 ml  Net -11.87 ml   There were no vitals filed for this visit.  Examination:  General exam: Appears calm and comfortable, chronically ill appearing Respiratory system: snoring, rr Cardiovascular system: rr Central nervous system: asleep Skin: No visible rashes, lesions or ulcers Psychiatry:  asleep    Data Reviewed: I have personally reviewed following labs and imaging studies  CBC: Recent Labs  Lab 11/11/2022 1251  WBC 16.9*  NEUTROABS 15.3*  HGB 8.5*  HCT 27.7*  MCV 102.2*  PLT [id number]   Basic Metabolic Panel: Recent Labs  Lab 11/19/2022 1251  NA 127*  K 6.3*  CL 99  CO2 17*  GLUCOSE 270*  BUN 85*  CREATININE 1.97*  CALCIUM 8.9   GFR: Estimated Creatinine Clearance: 21 mL/min (A) (by C-G formula based on SCr of 1.97  mg/dL (H)). Liver Function Tests: Recent Labs  Lab 11/25/2022 1251  AST 34  ALT 17  ALKPHOS 51  BILITOT 0.7  PROT 5.3*  ALBUMIN 2.7*   No results for input(s): "LIPASE", "AMYLASE" in the last 168 hours. No results for input(s): "AMMONIA" in the last 168 hours. Coagulation Profile: Recent Labs  Lab 11/09/2022 1251  INR 1.4*   Cardiac Enzymes: No results for input(s): "CKTOTAL", "CKMB", "CKMBINDEX", "TROPONINI" in the last 168 hours. BNP (last 3 results) No results for input(s): "PROBNP" in the last 8760 hours. HbA1C: No results for input(s): "HGBA1C" in the last 72 hours. CBG: No results for input(s): "GLUCAP" in the last 168 hours. Lipid Profile: No results for input(s): "CHOL", "HDL", "LDLCALC", "TRIG", "CHOLHDL", "LDLDIRECT" in the last 72 hours. Thyroid Function Tests: No results for input(s): "TSH", "T4TOTAL", "FREET4", "T3FREE", "THYROIDAB" in the last 72 hours. Anemia Panel: No results for input(s): "VITAMINB12", "FOLATE", "FERRITIN", "TIBC", "IRON", "RETICCTPCT" in the last 72 hours. Urine analysis:    Component Value Date/Time   COLORURINE YELLOW (A) 11/30/2022 1319   APPEARANCEUR HAZY (A) 11/13/2022 1319   APPEARANCEUR Clear 06/24/2018 1205   LABSPEC 1.015 11/19/2022 1319   PHURINE 5.0 12/03/2022 1319   GLUCOSEU NEGATIVE 11/25/2022 1319   GLUCOSEU NEGATIVE 03/18/2018 1542   HGBUR NEGATIVE 11/25/2022 1319   BILIRUBINUR NEGATIVE 11/16/2022 1319   BILIRUBINUR Negative 06/24/2018 1205   KETONESUR NEGATIVE 11/21/2022 1319   PROTEINUR NEGATIVE 11/08/2022 1319   UROBILINOGEN 1.0 03/18/2018 1542   NITRITE NEGATIVE 11/22/2022 1319   LEUKOCYTESUR SMALL (A) 11/08/2022 1319   Sepsis Labs: '@LABRCNTIP'$ (procalcitonin:4,lacticidven:4)  ) Recent Results (from the past 240 hour(s))  Resp panel by RT-PCR (RSV, Flu A&B, Covid) Anterior Nasal Swab     Status: None   Collection Time: 11/09/2022 12:49 PM   Specimen: Anterior Nasal Swab  Result Value Ref Range Status   SARS  Coronavirus 2 by RT PCR NEGATIVE NEGATIVE Final    Comment: (NOTE) SARS-CoV-2 target nucleic acids are NOT DETECTED.  The SARS-CoV-2 RNA is generally detectable in upper respiratory specimens during the acute phase of infection. The lowest concentration of SARS-CoV-2 viral copies this assay can detect is 138 copies/mL. A negative result does not preclude SARS-Cov-2 infection and should not be used as the sole basis for treatment or other patient management decisions. A negative result may occur with  improper specimen collection/handling, submission of specimen other than nasopharyngeal swab, presence of viral mutation(s) within the areas targeted by this assay, and inadequate number of viral copies(<138 copies/mL). A negative result must be combined with clinical observations, patient history, and epidemiological information. The expected result is Negative.  Fact Sheet for Patients:  EntrepreneurPulse.com.au  Fact Sheet for Healthcare Providers:  IncredibleEmployment.be  This test is no t yet approved or cleared by the [place] FDA and  has been authorized for detection and/or diagnosis of SARS-CoV-2 by FDA under an Emergency Use Authorization (EUA). This EUA will remain  in effect (meaning this test can be used) for the duration of the COVID-19 declaration under Section 564(b)(1) of the Act, 21 U.S.C.section 360bbb-3(b)(1), unless the authorization is terminated  or revoked sooner.       Influenza A by PCR NEGATIVE NEGATIVE Final   Influenza B by PCR NEGATIVE NEGATIVE Final    Comment: (NOTE) The Xpert Xpress SARS-CoV-2/FLU/RSV plus assay is intended as an aid in the diagnosis of influenza from Nasopharyngeal swab specimens and should not be used as a sole basis for treatment. Nasal washings and aspirates are unacceptable for Xpert Xpress SARS-CoV-2/FLU/RSV testing.  Fact Sheet for  Patients: EntrepreneurPulse.com.au  Fact Sheet for Healthcare Providers: IncredibleEmployment.be  This test is not yet approved or cleared by the [place] FDA and has been authorized for detection and/or diagnosis of SARS-CoV-2 by FDA under an Emergency Use Authorization (EUA). This EUA will remain in effect (meaning this test can be used) for the duration of the COVID-19 declaration under Section 564(b)(1) of the Act, 21 U.S.C. section 360bbb-3(b)(1), unless the authorization is terminated or revoked.     Resp Syncytial Virus by PCR NEGATIVE NEGATIVE Final    Comment: (NOTE) Fact Sheet for Patients: EntrepreneurPulse.com.au  Fact Sheet for Healthcare Providers: IncredibleEmployment.be  This test is not yet approved or cleared by the [place] FDA and has been authorized for detection and/or diagnosis of SARS-CoV-2 by FDA under an Emergency Use Authorization (EUA). This EUA will remain in effect (meaning this test can be used) for the duration of the COVID-19 declaration under Section 564(b)(1) of the Act, 21 U.S.C. section 360bbb-3(b)(1), unless the authorization is terminated or revoked.  Performed at [facility], [address] [place], [place] [address]   Blood Culture (routine x 2)     Status: None (Preliminary result)   Collection Time: 11/11/2022 12:51 PM   Specimen: BLOOD  Result Value Ref Range Status   Specimen Description   Final    BLOOD BLOOD RIGHT FOREARM Performed at [facility], [address] [place], [place] [address]    Special Requests   Final    BOTTLES DRAWN AEROBIC AND ANAEROBIC Blood Culture adequate volume Performed at [facility], [address], [place], [place] [address]    Culture  Setup Time   Final    GRAM POSITIVE COCCI AEROBIC BOTTLE ONLY Organism ID to follow CRITICAL RESULT CALLED TO, READ BACK BY AND VERIFIED WITH:  CAROLINE CHILDS 12/05/22 1637 MU    Culture GRAM POSITIVE COCCI  Final   Report Status PENDING  Incomplete  Blood Culture (routine x 2)     Status: None (Preliminary result)   Collection Time: 11/12/2022 12:51 PM   Specimen: BLOOD  Result Value Ref Range Status   Specimen Description BLOOD BLOOD RIGHT ARM  Final   Special Requests   Final    BOTTLES DRAWN AEROBIC AND ANAEROBIC Blood Culture adequate volume   Culture   Final    NO GROWTH 2 DAYS Performed at [facility], [address], [place],  [address]    Report Status PENDING  Incomplete  Blood Culture ID Panel (Reflexed)     Status: Abnormal   Collection Time: 11/10/2022 12:51 PM  Result Value Ref Range Status   Enterococcus faecalis NOT DETECTED NOT DETECTED Final   Enterococcus Faecium NOT DETECTED NOT DETECTED Final   Listeria monocytogenes NOT DETECTED NOT DETECTED Final   Staphylococcus species DETECTED (A) NOT DETECTED Final    Comment: CRITICAL RESULT CALLED  TO, READ BACK BY AND VERIFIED WITH: CAROLINE CHILDS 12/05/22 1637 MU    Staphylococcus aureus (BCID) NOT DETECTED NOT DETECTED Final   Staphylococcus epidermidis NOT DETECTED NOT DETECTED Final   Staphylococcus lugdunensis NOT DETECTED NOT DETECTED Final   Streptococcus species NOT DETECTED NOT DETECTED Final   Streptococcus agalactiae NOT DETECTED NOT DETECTED Final   Streptococcus pneumoniae NOT DETECTED NOT DETECTED Final   Streptococcus pyogenes NOT DETECTED NOT DETECTED Final   A.calcoaceticus-baumannii NOT DETECTED NOT DETECTED Final   Bacteroides fragilis NOT DETECTED NOT DETECTED Final   Enterobacterales NOT DETECTED NOT DETECTED Final   Enterobacter cloacae complex NOT DETECTED NOT DETECTED Final   Escherichia coli NOT DETECTED NOT DETECTED Final   Klebsiella aerogenes NOT DETECTED NOT DETECTED Final   Klebsiella oxytoca NOT DETECTED NOT DETECTED Final   Klebsiella pneumoniae NOT DETECTED NOT DETECTED Final   Proteus species NOT DETECTED  NOT DETECTED Final   Salmonella species NOT DETECTED NOT DETECTED Final   Serratia marcescens NOT DETECTED NOT DETECTED Final   Haemophilus influenzae NOT DETECTED NOT DETECTED Final   Neisseria meningitidis NOT DETECTED NOT DETECTED Final   Pseudomonas aeruginosa NOT DETECTED NOT DETECTED Final   Stenotrophomonas maltophilia NOT DETECTED NOT DETECTED Final   Candida albicans NOT DETECTED NOT DETECTED Final   Candida auris NOT DETECTED NOT DETECTED Final   Candida glabrata NOT DETECTED NOT DETECTED Final   Candida krusei NOT DETECTED NOT DETECTED Final   Candida parapsilosis NOT DETECTED NOT DETECTED Final   Candida tropicalis NOT DETECTED NOT DETECTED Final   Cryptococcus neoformans/gattii NOT DETECTED NOT DETECTED Final    Comment: Performed at [facility], [address] [place], [place] [address]         Radiology Studies: No results found.      Scheduled Meds:  scopolamine  1 patch Transdermal Q72H   Continuous Infusions:  HYDROmorphone 1 mg/hr (12/06/22 1250)     LOS: 2 days     Desma Maxim, MD [facility]   If 7PM-7AM, please contact night-coverage [email or web address] Password [email or web address] 12/06/2022, 2:25 PM

## 2022-12-06 NOTE — Progress Notes (Signed)
Patient resting peacefully in bed, no signs of discomfort noted. Dilaudid gtt infusing.

## 2022-12-06 NOTE — TOC Progression Note (Signed)
Transition of Care [facility]) - Progression Note    Patient Details  Name: Charlotte SIDDONS, MD MRN: [id number] Date of Birth: [birth year]  [facility] CM/SW Contact  Gerilyn Pilgrim, LCSW Phone Number: 12/06/2022, 1:50 PM  Clinical Narrative:   CSW spoke with family who report they would like to speak with [facility] about home hospice at [facility]. CSW sent referral to [place].          Expected Discharge Plan and Services                                               Social Determinants of Health (SDOH) Interventions SDOH Screenings   Food Insecurity: No Food Insecurity (06/21/2022)  Housing: Low Risk  (12/05/2022)  Transportation Needs: No Transportation Needs (06/21/2022)  Utilities: Not At Risk (06/21/2022)  Depression (PHQ2-9): Low Risk  (09/20/2022)  Financial Resource Strain: Low Risk  (05/30/2022)  Physical Activity: Sufficiently Active (05/30/2022)  Social Connections: Unknown (05/30/2022)  Stress: No Stress Concern Present (05/30/2022)  Tobacco Use: Medium Risk (12/05/2022)    Readmission Risk Interventions     No data to display

## 2022-12-06 NOTE — Progress Notes (Signed)
Patient continues to have labored, shallow breathing with some twitching noted. Given ativan per order, along with robinul for secretions.

## 2022-12-06 NOTE — Progress Notes (Signed)
Met with family members at bedside. Updated on POC and medications given. Family expressed content with current level of comfort and plan.

## 2022-12-06 NOTE — Progress Notes (Signed)
Daily Progress Note   Patient Name: Charlotte Sessions, MD       Date: 12/06/2022 DOB: 04-30-1936  Age: 87 y.o. MRN#: [id number] Attending Physician: Gwynne Edinger, MD Primary Care Physician: Dewayne Shorter, MD Admit Date: 11/29/2022  Reason for Consultation/Follow-up: Establishing goals of care  HPI/Brief Hospital Review: 87 y.o. female  with past medical history of chronic diastolic CHF, CAD s/p CABG, PAF on Eliquis, asthma, HTN, CKD, spinal stenosis, and HLD admitted from [facility] facility on 11/28/2022 with worsening AMS and hypotension. In ED found to have UTI and met sepsis criteria, ongoing hypotension even with fluid resuscitation, briefly started on Levophed.   iPAL discussion with family and Dr. Eveline Keto to comfort care per patient and family wishes (2/27)   Palliative medicine was consulted for assisting with ongoing goals of care discussions and management of comfort care orders.  Subjective: Extensive chart review has been completed prior to meeting patient including labs, vital signs, imaging, progress notes, orders, and available advanced directive documents from current and previous encounters.    Visited with Charlotte Wagner at her bedside earlier in the day. Resting comfortably, remains on continuous hydromorphone infusion. Daughter in Sports coach and granddaughter at bedside visiting. Noticeable periods of apnea. Family shares Charlotte Wagner's children will be visiting later today-further discussions surrounding Hospice and disposition to be had with them.  Later visited bedside, no changes in assessment of Charlotte Wagner. Remains comfortable with hydromorphone infusions. Both sons present during time of visit. Discussions around Hospice services and transition back to [facility] under  hospice. Sons considering this option, again wish to ensure their mothers comfort during the transport and transition and questions her stability. Shared with them at this time, Charlotte Wagner appears stable for transport but would need ongoing reassessments once that decision had been made. Agree to meeting with hospice liaison to further discuss options.  Noted, Charlotte Wagner without PO intake for 48 hours. Prognosis likely days to week.  Objective:  Physical Exam Constitutional:      General: She is not in acute distress.    Appearance: She is ill-appearing.  Pulmonary:     Effort: Pulmonary effort is normal. No respiratory distress.  Skin:    General: Skin is warm and dry.             Vital  Signs: BP (!) 112/99 (BP Location: Left Arm)   Pulse (!) 102   Temp 97.7 F (36.5 C)   Resp 17   SpO2 (!) 82%  SpO2: SpO2: (!) 82 % O2 Device: O2 Device: Room Air O2 Flow Rate:     Palliative Care Assessment & Plan   Assessment/Recommendation/Plan  DNR Full comfort measures Family agrees to meeting with hospice liaison with possible d/c back to [facility] under hospice services Continue hydromorphone infusion, Robinul IV PRN, Ativan IV PRN to maintain comfort PMT to continue to follow for ongoing support and needs  Care plan was discussed with nursing staff, primary team and TOC.  Thank you for allowing the Palliative Medicine Team to assist in the care of this patient.  Total time:  25 minutes  Time spent includes: Detailed review of medical records (labs, imaging, vital signs), medically appropriate exam (mental status, respiratory, cardiac, skin), discussed with treatment team, counseling and educating patient, family and staff, documenting clinical information, medication management and coordination of care.  Theodoro Grist, DNP, AGNP-C Palliative Medicine   Please contact Palliative Medicine Team phone at [phone] for questions and concerns.

## 2022-12-06 NOTE — Progress Notes (Signed)
Patient continues to display signs of discomfort. Boluses given per order.

## 2022-12-06 NOTE — Progress Notes (Signed)
[facility])  Hospital Liaison RN note  Received request from [facility] for hospice services at [facility] after discharge. Chart and patient information under review by Hospice physician.    Met with son and daughter at bedside. Services were explained and at this time they would like to give it until tomorrow to see how there mother is doing.   Liaison will follow up tomorrow.  [facility] information and contact numbers given to both son and daughter. Above information shared with [place].   Please call with any hospice related questions or concerns.  Thank you for the opportunity to participate in this patient's care.  Jhonnie Garner, Therapist, sports, BSN [employer] [phone]

## 2022-12-06 NOTE — Progress Notes (Signed)
Patient administered dilaudid bolus from bag per order for pre-medication for turning.

## 2022-12-06 NOTE — Progress Notes (Signed)
Attempted to call patient's daughter to notify her of patient status. No answer. No family currently at bedside.

## 2022-12-07 DIAGNOSIS — Z515 Encounter for palliative care: Secondary | ICD-10-CM | POA: Diagnosis not present

## 2022-12-07 LAB — CULTURE, BLOOD (ROUTINE X 2): Special Requests: ADEQUATE

## 2022-12-07 DEATH — deceased

## 2022-12-09 LAB — CULTURE, BLOOD (ROUTINE X 2)
Culture: NO GROWTH
Special Requests: ADEQUATE

## 2023-01-07 NOTE — Progress Notes (Signed)
Patient passed away peacefully at 9:30 am family present in room and time of death.  Patient transported to morgue in [facility].  Patient body to be picked up from [place] by representatives of [employer] of program [place] Person [phone]

## 2023-01-07 NOTE — Progress Notes (Signed)
   12-18-22 0900  Spiritual Encounters  Type of Visit Initial  Care provided to: Family  Referral source Nurse (RN/NT/LPN)  Reason for visit Grief/loss  OnCall Visit Yes  Spiritual Framework  Presenting Themes Rituals and practive  Community/Connection Friend(s);Significant other;Faith community;Spiritual leader  Family Stress Factors Loss  Interventions  Spiritual Care Interventions Made Established relationship of care and support;Compassionate presence;Reflective listening;Prayer  Intervention Outcomes  Outcomes Reduced anxiety  Spiritual Care Plan  Spiritual Care Issues Still Outstanding No further spiritual care needs at this time (see row info)   Was called by Nursing staff to come pray for the family in room 106 on 1 C. Patient has pass away and family is requesting pray. Family said that their mother was religious person and was ready to pass on in this life. Pray for brother and sister over and often them my condolence. They [facility] is here for them if they need [place].

## 2023-01-07 NOTE — Progress Notes (Signed)
Visited with family at bedside after recent passing of Ms. Otting. Condolences and emotional support provided to family. Family pastor at bedside providing emotional support.  No Charge.  Theodoro Grist, DNP, AGNP-C Palliative Medicine  Please call Palliative Medicine team phone with any questions [phone]. For individual providers please see AMION.

## 2023-01-07 NOTE — Care Management Important Message (Signed)
Important Message  Patient Details  Name: Charlotte MATTIA, MD MRN: [id number] Date of Birth: [birth year]   Medicare Important Message Given:  N/A - LOS <3 / Initial given by admissions     Juliann Pulse A Aric Jost 12-26-2022, 8:05 AM

## 2023-01-07 NOTE — Death Summary Note (Signed)
DEATH SUMMARY   Patient Details  Name: Charlotte POPOVICH, MD MRN: [id number] DOB: 12/11/1935 [id number], Jordan Hawks, MD Admission/Discharge Information   Admit Date:  12-12-2022  Date of Death: Date of Death: 2022/12/15  Time of Death: Time of Death: 0930  Length of Stay: 3   Principle Cause of death: sepsis  Hospital Diagnoses: Principal Problem:   Comfort measures only status Active Problems:   Septic shock ([place])   UTI (urinary tract infection)   Chronic diastolic heart failure ([facility])   Hyperkalemia   CAD (coronary artery disease)   Acute renal failure superimposed on stage 3b chronic kidney disease ([place])   Acute metabolic encephalopathy   Hyponatremia   Asthma   Hyperlipidemia   HTN (hypertension)   Depression with anxiety   PVD (peripheral vascular disease) ([place])   Macrocytic anemia   Hospital Course: From admission h and p: [name], 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   Per her daughter at the bedside, pt has had increasing confusion over the past several days. Patient was found to have hypotension. Patient has been confused in ED, but intermittently becomes a little more clear. When I saw pt in ED, she seems to know her own name, and knows that she is in the hospital at one time, but then becoming confused again. She is not orientated to time.  She moves all extremities.  She has dry cough and SOB, no nausea, vomiting, diarrhea noted.  Not sure if patient has symptoms of UTI.  Does not seem to have chest pain or abdominal pain.   Her blood pressure is down to 62/22 in ED. After giving 2 L normal saline bolus, her blood pressure is 82/50 in ED. Dr. Mortimer Fries of ICU was consulted. He had extensive  discussion with the family, decision was made for comfort care.  Assessment and Plan:  # Encephalopathy # Hypotension # Staph bacteremia # SIRS # Hyperkalemia # Hyponatremia # AKI # Lactic acidosis # Anemia One blood culture growing GPCs. Briefly received pressors and abx. Patient and family have elected comfort care. Pain and agitation resolved with dilaudid GTT.        Procedures: none   The results of significant diagnostics from this hospitalization (including imaging, microbiology, ancillary and laboratory) are listed below for reference.   Significant Diagnostic Studies: DG Chest Port 1 View  Result Date: 12-12-2022 CLINICAL DATA:  Questionable sepsis, evaluate for abnormality EXAM: PORTABLE CHEST 1 VIEW COMPARISON:  None Available. FINDINGS: The heart is enlarged. Pulmonary vascular congestion without evidence of frank pulmonary edema sternotomy wires suggesting prior cardiac surgery. Elevation of the left hemidiaphragm. Thoracic spondylosis and bilateral glenohumeral osteoarthritis. IMPRESSION: Cardiomegaly with pulmonary vascular congestion. No evidence of frank pulmonary edema. Elevation of the left hemidiaphragm, likely a chronic process. Electronically Signed   By: Keane Police D.O.   On: 2022/12/12 13:16    Microbiology: Recent Results (from the past 240 hour(s))  Resp panel by RT-PCR (RSV, Flu A&B, Covid) Anterior Nasal Swab     Status: None   Collection Time: 2022/12/12 12:49 PM   Specimen: Anterior Nasal Swab  Result Value Ref Range Status   SARS Coronavirus 2 by RT PCR NEGATIVE NEGATIVE Final    Comment: (NOTE) SARS-CoV-2 target nucleic acids are NOT  DETECTED.  The SARS-CoV-2 RNA is generally detectable in upper respiratory specimens during the acute phase of infection. The lowest concentration of SARS-CoV-2 viral copies this assay can detect is 138 copies/mL. A negative result does not preclude SARS-Cov-2 infection and should not be used as the sole basis  for treatment or other patient management decisions. A negative result may occur with  improper specimen collection/handling, submission of specimen other than nasopharyngeal swab, presence of viral mutation(s) within the areas targeted by this assay, and inadequate number of viral copies(<138 copies/mL). A negative result must be combined with clinical observations, patient history, and epidemiological information. The expected result is Negative.  Fact Sheet for Patients:  EntrepreneurPulse.com.au  Fact Sheet for Healthcare Providers:  IncredibleEmployment.be  This test is no t yet approved or cleared by the [place] FDA and  has been authorized for detection and/or diagnosis of SARS-CoV-2 by FDA under an Emergency Use Authorization (EUA). This EUA will remain  in effect (meaning this test can be used) for the duration of the COVID-19 declaration under Section 564(b)(1) of the Act, 21 U.S.C.section 360bbb-3(b)(1), unless the authorization is terminated  or revoked sooner.       Influenza A by PCR NEGATIVE NEGATIVE Final   Influenza B by PCR NEGATIVE NEGATIVE Final    Comment: (NOTE) The Xpert Xpress SARS-CoV-2/FLU/RSV plus assay is intended as an aid in the diagnosis of influenza from Nasopharyngeal swab specimens and should not be used as a sole basis for treatment. Nasal washings and aspirates are unacceptable for Xpert Xpress SARS-CoV-2/FLU/RSV testing.  Fact Sheet for Patients: EntrepreneurPulse.com.au  Fact Sheet for Healthcare Providers: IncredibleEmployment.be  This test is not yet approved or cleared by the [place] FDA and has been authorized for detection and/or diagnosis of SARS-CoV-2 by FDA under an Emergency Use Authorization (EUA). This EUA will remain in effect (meaning this test can be used) for the duration of the COVID-19 declaration under Section 564(b)(1) of the Act, 21  U.S.C. section 360bbb-3(b)(1), unless the authorization is terminated or revoked.     Resp Syncytial Virus by PCR NEGATIVE NEGATIVE Final    Comment: (NOTE) Fact Sheet for Patients: EntrepreneurPulse.com.au  Fact Sheet for Healthcare Providers: IncredibleEmployment.be  This test is not yet approved or cleared by the [place] FDA and has been authorized for detection and/or diagnosis of SARS-CoV-2 by FDA under an Emergency Use Authorization (EUA). This EUA will remain in effect (meaning this test can be used) for the duration of the COVID-19 declaration under Section 564(b)(1) of the Act, 21 U.S.C. section 360bbb-3(b)(1), unless the authorization is terminated or revoked.  Performed at [facility], [address] [place], [place] [address]   Blood Culture (routine x 2)     Status: None (Preliminary result)   Collection Time: 12/01/2022 12:51 PM   Specimen: BLOOD  Result Value Ref Range Status   Specimen Description   Final    BLOOD BLOOD RIGHT FOREARM Performed at [facility], [address], [place], [place] [address]    Special Requests   Final    BOTTLES DRAWN AEROBIC AND ANAEROBIC Blood Culture adequate volume Performed at [facility], [address], [place], [place] [address]    Culture  Setup Time   Final    GRAM POSITIVE COCCI AEROBIC BOTTLE ONLY Organism ID to follow CRITICAL RESULT CALLED TO, READ BACK BY AND VERIFIED WITH: CAROLINE CHILDS 12/05/22 1637 MU    Culture GRAM POSITIVE COCCI  Final   Report Status  PENDING  Incomplete  Blood Culture (routine x 2)     Status: None (Preliminary result)   Collection Time: 11/23/2022 12:51 PM   Specimen: BLOOD  Result Value Ref Range Status   Specimen Description BLOOD BLOOD RIGHT ARM  Final   Special Requests   Final    BOTTLES DRAWN AEROBIC AND ANAEROBIC Blood Culture adequate volume   Culture   Final    NO GROWTH 3 DAYS Performed at [facility], [address] [place], [place] [address]    Report Status PENDING  Incomplete  Blood Culture ID Panel (Reflexed)     Status: Abnormal   Collection Time: 11/25/2022 12:51 PM  Result Value Ref Range Status   Enterococcus faecalis NOT DETECTED NOT DETECTED Final   Enterococcus Faecium NOT DETECTED NOT DETECTED Final   Listeria monocytogenes NOT DETECTED NOT DETECTED Final   Staphylococcus species DETECTED (A) NOT DETECTED Final    Comment: CRITICAL RESULT CALLED TO, READ BACK BY AND VERIFIED WITH: CAROLINE CHILDS 12/05/22 1637 MU    Staphylococcus aureus (BCID) NOT DETECTED NOT DETECTED Final   Staphylococcus epidermidis NOT DETECTED NOT DETECTED Final   Staphylococcus lugdunensis NOT DETECTED NOT DETECTED Final   Streptococcus species NOT DETECTED NOT DETECTED Final   Streptococcus agalactiae NOT DETECTED NOT DETECTED Final   Streptococcus pneumoniae NOT DETECTED NOT DETECTED Final   Streptococcus pyogenes NOT DETECTED NOT DETECTED Final   A.calcoaceticus-baumannii NOT DETECTED NOT DETECTED Final   Bacteroides fragilis NOT DETECTED NOT DETECTED Final   Enterobacterales NOT DETECTED NOT DETECTED Final   Enterobacter cloacae complex NOT DETECTED NOT DETECTED Final   Escherichia coli NOT DETECTED NOT DETECTED Final   Klebsiella aerogenes NOT DETECTED NOT DETECTED Final   Klebsiella oxytoca NOT DETECTED NOT DETECTED Final   Klebsiella pneumoniae NOT DETECTED NOT DETECTED Final   Proteus species NOT DETECTED NOT DETECTED Final   Salmonella species NOT DETECTED NOT DETECTED Final   Serratia marcescens NOT DETECTED NOT DETECTED Final   Haemophilus influenzae NOT DETECTED NOT DETECTED Final   Neisseria meningitidis NOT DETECTED NOT DETECTED Final   Pseudomonas aeruginosa NOT DETECTED NOT DETECTED Final   Stenotrophomonas maltophilia NOT DETECTED NOT DETECTED Final   Candida albicans NOT DETECTED NOT DETECTED Final   Candida auris NOT DETECTED NOT DETECTED Final   Candida  glabrata NOT DETECTED NOT DETECTED Final   Candida krusei NOT DETECTED NOT DETECTED Final   Candida parapsilosis NOT DETECTED NOT DETECTED Final   Candida tropicalis NOT DETECTED NOT DETECTED Final   Cryptococcus neoformans/gattii NOT DETECTED NOT DETECTED Final    Comment: Performed at [facility], [address] [place],  [address]    Time spent: 35 minutes  Signed: Desma Maxim, MD 17-Dec-2022

## 2023-01-07 DEATH — deceased
